# Patient Record
Sex: Male | Born: 1950
Health system: Southern US, Community
[De-identification: ages and names within clinical notes are randomized; demographics above are authoritative.]

## PROBLEM LIST (undated history)

## (undated) DIAGNOSIS — N186 End stage renal disease: Secondary | ICD-10-CM

## (undated) DIAGNOSIS — I509 Heart failure, unspecified: Secondary | ICD-10-CM

## (undated) DIAGNOSIS — H5440 Blindness, one eye, unspecified eye: Secondary | ICD-10-CM

## (undated) DIAGNOSIS — Z9289 Personal history of other medical treatment: Secondary | ICD-10-CM

## (undated) DIAGNOSIS — Z992 Dependence on renal dialysis: Secondary | ICD-10-CM

## (undated) DIAGNOSIS — M310 Hypersensitivity angiitis: Secondary | ICD-10-CM

## (undated) DIAGNOSIS — E119 Type 2 diabetes mellitus without complications: Secondary | ICD-10-CM

## (undated) DIAGNOSIS — N184 Chronic kidney disease, stage 4 (severe): Secondary | ICD-10-CM

## (undated) DIAGNOSIS — D649 Anemia, unspecified: Secondary | ICD-10-CM

## (undated) DIAGNOSIS — D61818 Other pancytopenia: Secondary | ICD-10-CM

## (undated) DIAGNOSIS — N189 Chronic kidney disease, unspecified: Secondary | ICD-10-CM

## (undated) DIAGNOSIS — I519 Heart disease, unspecified: Secondary | ICD-10-CM

## (undated) DIAGNOSIS — Z862 Personal history of diseases of the blood and blood-forming organs and certain disorders involving the immune mechanism: Secondary | ICD-10-CM

## (undated) DIAGNOSIS — K7460 Unspecified cirrhosis of liver: Secondary | ICD-10-CM

## (undated) DIAGNOSIS — I10 Essential (primary) hypertension: Secondary | ICD-10-CM

## (undated) HISTORY — PX: CATARACT EXTRACTION W/ INTRAOCULAR LENS  IMPLANT, BILATERAL: SHX1307

---
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
## 2004-02-20 ENCOUNTER — Emergency Department (HOSPITAL_COMMUNITY): Admission: EM | Admit: 2004-02-20 | Discharge: 2004-02-20 | Payer: Self-pay | Admitting: Family Medicine

## 2004-04-05 ENCOUNTER — Emergency Department (HOSPITAL_COMMUNITY): Admission: EM | Admit: 2004-04-05 | Discharge: 2004-04-05 | Payer: Self-pay | Admitting: Family Medicine

## 2005-08-05 HISTORY — PX: NECK SURGERY: SHX720

## 2009-11-10 ENCOUNTER — Emergency Department (HOSPITAL_COMMUNITY): Admission: EM | Admit: 2009-11-10 | Discharge: 2009-11-10 | Payer: Self-pay | Admitting: Emergency Medicine

## 2010-10-24 LAB — GLUCOSE, CAPILLARY: Glucose-Capillary: 276 mg/dL — ABNORMAL HIGH (ref 70–99)

## 2011-05-27 ENCOUNTER — Inpatient Hospital Stay (INDEPENDENT_AMBULATORY_CARE_PROVIDER_SITE_OTHER)
Admission: RE | Admit: 2011-05-27 | Discharge: 2011-05-27 | Disposition: A | Discharge status: Home / Self Care | Payer: Self-pay | Source: Ambulatory Visit | Attending: Emergency Medicine | Admitting: Emergency Medicine

## 2011-05-27 DIAGNOSIS — R51 Headache: Secondary | ICD-10-CM

## 2011-05-27 DIAGNOSIS — I10 Essential (primary) hypertension: Secondary | ICD-10-CM

## 2011-06-03 ENCOUNTER — Inpatient Hospital Stay (INDEPENDENT_AMBULATORY_CARE_PROVIDER_SITE_OTHER)
Admission: RE | Admit: 2011-06-03 | Discharge: 2011-06-03 | Disposition: A | Discharge status: Home / Self Care | Payer: Self-pay | Source: Ambulatory Visit | Attending: Family Medicine | Admitting: Family Medicine

## 2011-06-03 DIAGNOSIS — I10 Essential (primary) hypertension: Secondary | ICD-10-CM

## 2012-06-25 ENCOUNTER — Emergency Department (HOSPITAL_COMMUNITY)
Admission: EM | Admit: 2012-06-25 | Discharge: 2012-06-25 | Disposition: A | Discharge status: Home / Self Care | Payer: Self-pay | Source: Home / Self Care

## 2012-06-25 DIAGNOSIS — E119 Type 2 diabetes mellitus without complications: Secondary | ICD-10-CM

## 2012-06-25 DIAGNOSIS — I10 Essential (primary) hypertension: Secondary | ICD-10-CM

## 2012-06-25 LAB — BASIC METABOLIC PANEL
BUN: 38 mg/dL — ABNORMAL HIGH (ref 6–23)
CO2: 26 mEq/L (ref 19–32)
GFR calc Af Amer: >90 mL/min (ref >90)
GFR calc non Af Amer: 88 mL/min — ABNORMAL LOW (ref >90)
Glucose, Bld: 276 mg/dL — ABNORMAL HIGH (ref 70–99)
Potassium: 4.3 mEq/L (ref 3.5–5.1)

## 2012-06-25 MED ORDER — AMLODIPINE BESYLATE 10 MG PO TABS: 10.0000 mg | ORAL_TABLET | Freq: Every day | ORAL | Status: DC

## 2012-06-25 MED ORDER — METOPROLOL TARTRATE 50 MG PO TABS: 25.0000 mg | ORAL_TABLET | Freq: Two times a day (BID) | ORAL | Status: DC

## 2012-06-25 MED ORDER — ANTI-GAS PO CAPS: 30.0000 | ORAL_CAPSULE | Freq: Three times a day (TID) | ORAL | Status: DC | PRN

## 2012-06-25 NOTE — ED Provider Notes (Signed)
Patient Demographics  William [name], is a 61 y.o. male  WUJ:[id number]  [id number]  DOB - 03-Feb-1951  Chief Complaint  Patient presents with  . Hypertension  . Diabetes        Subjective:   William [name] today has, No headache, No chest pain, No abdominal pain - No Nausea, No new weakness tingling or numbness, No Cough - SOB.  Objective:    Filed Vitals:   06/25/12 1722  BP: 165/81  Pulse: 99  Temp: 98.7 F (37.1 C)  TempSrc: Oral  Resp: 18  SpO2: 100%     Exam  Awake Alert, Oriented X 3, No new F.N deficits, Normal affect [place].AT,PERRAL Supple Neck,No JVD, No cervical lymphadenopathy appriciated.  Symmetrical Chest wall movement, Good air movement bilaterally, CTAB RRR,No Gallops,Rubs or new Murmurs, No Parasternal Heave +ve B.Sounds, Abd Soft, Non tender, No organomegaly appriciated, No rebound - guarding or rigidity. No Cyanosis, Clubbing or edema, No new Rash or bruise      Data Review   CBC No results found for this basename: WBC:5,HGB:5,HCT:5,PLT:5,MCV:5,MCH:5,MCHC:5,RDW:5,NEUTRABS:5,LYMPHSABS:5,MONOABS:5,EOSABS:5,BASOSABS:5,BANDABS:5,BANDSABD:5 in the last 168 hours  Chemistries   No results found for this basename: NA:5,K:5,CL:5,CO2:5,GLUCOSE:5,BUN:5,CREATININE:5,GFRCGP,:5,CALCIUM:5,MG:5,AST:5,ALT:5,ALKPHOS:5,BILITOT:5 in the last 168 hours ------------------------------------------------------------------------------------------------------------------ No results found for this basename: HGBA1C:2 in the last 72 hours ------------------------------------------------------------------------------------------------------------------ No results found for this basename: CHOL:2,HDL:2,LDLCALC:2,TRIG:2,CHOLHDL:2,LDLDIRECT:2 in the last 72 hours ------------------------------------------------------------------------------------------------------------------ No results found for this basename: TSH,T4TOTAL,FREET3,T3FREE,THYROIDAB in the last 72  hours ------------------------------------------------------------------------------------------------------------------ No results found for this basename: VITAMINB12:2,FOLATE:2,FERRITIN:2,TIBC:2,IRON:2,RETICCTPCT:2 in the last 72 hours  Coagulation profile  No results found for this basename: INR:5,PROTIME:5 in the last 168 hours  Urinalysis No results found for this basename: colorurine, appearanceur, labspec, phurine, glucoseu, hgbur, bilirubinur, ketonesur, proteinur, urobilinogen, nitrite, leukocytesur     Prior to Admission medications   Medication Sig Start Date End Date Taking? Authorizing Provider  insulin NPH-insulin regular (NOVOLIN 70/30) (70-30) 100 UNIT/ML injection Inject into the skin 2 (two) times daily with a meal. 30 units in am and 10 units at night   Yes Historical Provider, MD  amLODipine (NORVASC) 10 MG tablet Take 1 tablet (10 mg total) by mouth daily. 06/25/12   Thurnell Lose, MD  metoprolol (LOPRESSOR) 50 MG tablet Take 0.5 tablets (25 mg total) by mouth 2 (two) times daily. 06/25/12   Thurnell Lose, MD     Assessment & Plan   He should here for routine followup visit for medication refills his only complaint is he has been noticing his blood pressure has been running high at home.   1. Essential hypertension in poor control patient not on any blood pressure medications, I have placed him on Norvasc and Lopressor, also check baseline BMP, we'll get him back in a week to monitor on his BMP along with his blood pressure, if BMP is stable and blood pressure still elevated low-dose ACE inhibitor can be added.   2. Diabetes mellitus type 2 home sugars running between 125 and 170, counseled on q. a.c. at bedtime Accu-Cheks, we'll continue home dose 7030, will check A1c, patient to come back in a week for A1c and Accu-Chek followup.      Thurnell Lose M.D on 06/25/2012 at 5:25 PM  Thurnell Lose, MD 06/25/12 1728

## 2012-06-25 NOTE — ED Notes (Signed)
Per son pt is having problems with htn and needs blood pressure checked

## 2012-06-26 LAB — HEMOGLOBIN A1C: Mean Plasma Glucose: 226 mg/dL — ABNORMAL HIGH (ref <117)

## 2013-01-15 NOTE — Progress Notes (Signed)
Quick Note:  Please hava patient come back for diabetes management ______

## 2013-01-21 ENCOUNTER — Telehealth: Payer: Self-pay | Admitting: *Deleted

## 2013-01-21 NOTE — Telephone Encounter (Signed)
01/21/13 Patient not available spoke with son who said his father is in [place] And will be returning today from [place] will call and schedule appt. For follow up. P.Jasa Dundon,RN BSN [facility]

## 2013-01-27 ENCOUNTER — Emergency Department (HOSPITAL_BASED_OUTPATIENT_CLINIC_OR_DEPARTMENT_OTHER)
Admission: EM | Admit: 2013-01-27 | Discharge: 2013-01-27 | Disposition: A | Discharge status: Home / Self Care | Payer: Self-pay | Attending: Emergency Medicine | Admitting: Emergency Medicine

## 2013-01-27 ENCOUNTER — Emergency Department (HOSPITAL_BASED_OUTPATIENT_CLINIC_OR_DEPARTMENT_OTHER): Payer: Self-pay

## 2013-01-27 ENCOUNTER — Encounter (HOSPITAL_BASED_OUTPATIENT_CLINIC_OR_DEPARTMENT_OTHER): Payer: Self-pay | Admitting: Family Medicine

## 2013-01-27 DIAGNOSIS — J189 Pneumonia, unspecified organism: Secondary | ICD-10-CM

## 2013-01-27 DIAGNOSIS — E119 Type 2 diabetes mellitus without complications: Secondary | ICD-10-CM | POA: Insufficient documentation

## 2013-01-27 DIAGNOSIS — R0602 Shortness of breath: Secondary | ICD-10-CM | POA: Insufficient documentation

## 2013-01-27 DIAGNOSIS — R5381 Other malaise: Secondary | ICD-10-CM | POA: Insufficient documentation

## 2013-01-27 DIAGNOSIS — Z794 Long term (current) use of insulin: Secondary | ICD-10-CM | POA: Insufficient documentation

## 2013-01-27 DIAGNOSIS — R509 Fever, unspecified: Secondary | ICD-10-CM | POA: Insufficient documentation

## 2013-01-27 DIAGNOSIS — R1084 Generalized abdominal pain: Secondary | ICD-10-CM | POA: Insufficient documentation

## 2013-01-27 DIAGNOSIS — J159 Unspecified bacterial pneumonia: Secondary | ICD-10-CM | POA: Insufficient documentation

## 2013-01-27 DIAGNOSIS — IMO0001 Reserved for inherently not codable concepts without codable children: Secondary | ICD-10-CM | POA: Insufficient documentation

## 2013-01-27 DIAGNOSIS — I10 Essential (primary) hypertension: Secondary | ICD-10-CM | POA: Insufficient documentation

## 2013-01-27 DIAGNOSIS — Z79899 Other long term (current) drug therapy: Secondary | ICD-10-CM | POA: Insufficient documentation

## 2013-01-27 DIAGNOSIS — R0989 Other specified symptoms and signs involving the circulatory and respiratory systems: Secondary | ICD-10-CM | POA: Insufficient documentation

## 2013-01-27 DIAGNOSIS — R112 Nausea with vomiting, unspecified: Secondary | ICD-10-CM | POA: Insufficient documentation

## 2013-01-27 HISTORY — DX: Essential (primary) hypertension: I10

## 2013-01-27 LAB — COMPREHENSIVE METABOLIC PANEL
AST: 44 U/L — ABNORMAL HIGH (ref 0–37)
Chloride: 95 mEq/L — ABNORMAL LOW (ref 96–112)
Creatinine, Ser: 1.2 mg/dL (ref 0.50–1.35)
GFR calc non Af Amer: 64 mL/min — ABNORMAL LOW (ref >90)
Glucose, Bld: 324 mg/dL — ABNORMAL HIGH (ref 70–99)
Potassium: 4.5 mEq/L (ref 3.5–5.1)
Sodium: 131 mEq/L — ABNORMAL LOW (ref 135–145)
Total Protein: 7.6 g/dL (ref 6.0–8.3)

## 2013-01-27 LAB — URINALYSIS, ROUTINE W REFLEX MICROSCOPIC

## 2013-01-27 LAB — URINE MICROSCOPIC-ADD ON

## 2013-01-27 LAB — CG4 I-STAT (LACTIC ACID): Lactic Acid, Venous: 0.97 mmol/L (ref 0.5–2.2)

## 2013-01-27 MED ORDER — IBUPROFEN 600 MG PO TABS: 600.0000 mg | ORAL_TABLET | Freq: Four times a day (QID) | ORAL | Status: DC | PRN

## 2013-01-27 MED ORDER — AZITHROMYCIN 250 MG PO TABS: ORAL_TABLET | ORAL | Status: DC

## 2013-01-27 MED ORDER — ONDANSETRON HCL 4 MG/2ML IJ SOLN
4.0000 mg | Freq: Once | INTRAMUSCULAR | Status: AC
  Administered 2013-01-27: 4 mg via INTRAVENOUS
  Filled 2013-01-27: qty 2

## 2013-01-27 MED ORDER — DEXTROSE 5 % IV SOLN
1.0000 g | Freq: Once | INTRAVENOUS | Status: AC
  Administered 2013-01-27: 21:00:00 via INTRAVENOUS
  Filled 2013-01-27: qty 10

## 2013-01-27 MED ORDER — ACETAMINOPHEN 325 MG PO TABS
650.0000 mg | ORAL_TABLET | Freq: Once | ORAL | Status: AC
  Administered 2013-01-27: 650 mg via ORAL

## 2013-01-27 MED ORDER — BENZONATATE 100 MG PO CAPS: 100.0000 mg | ORAL_CAPSULE | Freq: Three times a day (TID) | ORAL | Status: DC

## 2013-01-27 MED ORDER — ACETAMINOPHEN 325 MG PO TABS
ORAL_TABLET | ORAL | Status: AC
  Administered 2013-01-27: 650 mg via ORAL
  Filled 2013-01-27: qty 2

## 2013-01-27 MED ORDER — SODIUM CHLORIDE 0.9 % IV BOLUS (SEPSIS)
1000.0000 mL | Freq: Once | INTRAVENOUS | Status: AC
  Administered 2013-01-27: 1000 mL via INTRAVENOUS

## 2013-01-27 NOTE — ED Provider Notes (Signed)
History    CSN: [id number] Arrival date & time 01/27/13  1840  First MD Initiated Contact with Patient 01/27/13 1901     Chief Complaint  Patient presents with  . Fever  . Cough   (Consider location/radiation/quality/duration/timing/severity/associated sxs/prior Treatment) HPI Pt with 3-4 days of cough, fever, nausea and diffuse abd pain. Pt does not speak english and family at bedside interpreting. No lower ext swelling. No rash. No sick contacts. Recent flight to [place].  Past Medical History  Diagnosis Date  . Diabetes mellitus without complication   . Hypertension    Past Surgical History  Procedure Laterality Date  . Neck surgery     No family history on file. History  Substance Use Topics  . Smoking status: Never Smoker   . Smokeless tobacco: Not on file  . Alcohol Use: No    Review of Systems  Constitutional: Positive for fever, chills and fatigue.  HENT: Negative for sore throat, neck pain, neck stiffness and sinus pressure.   Respiratory: Positive for cough and shortness of breath.   Cardiovascular: Negative for chest pain, palpitations and leg swelling.  Gastrointestinal: Positive for nausea, vomiting and abdominal pain. Negative for diarrhea.  Genitourinary: Negative for dysuria.  Musculoskeletal: Positive for myalgias.  Skin: Negative for rash and wound.  Neurological: Negative for dizziness, weakness, light-headedness, numbness and headaches.  All other systems reviewed and are negative.    Allergies  Review of patient's allergies indicates no known allergies.  Home Medications   Current Outpatient Rx  Name  Route  Sig  Dispense  Refill  . Alpha-D-Galactosidase (ANTI-GAS) CAPS   Oral   Take 30 capsules by mouth 3 (three) times daily as needed (gas).   30 capsule   0   . amLODipine (NORVASC) 10 MG tablet   Oral   Take 1 tablet (10 mg total) by mouth daily.   30 tablet   1   . azithromycin (ZITHROMAX Z-PAK) 250 MG tablet      2 po day  one, then 1 daily x 4 days   5 tablet   0   . benzonatate (TESSALON) 100 MG capsule   Oral   Take 1 capsule (100 mg total) by mouth every 8 (eight) hours.   21 capsule   0   . ibuprofen (ADVIL,MOTRIN) 600 MG tablet   Oral   Take 1 tablet (600 mg total) by mouth every 6 (six) hours as needed for pain.   30 tablet   0   . insulin NPH-insulin regular (NOVOLIN 70/30) (70-30) 100 UNIT/ML injection   Subcutaneous   Inject into the skin 2 (two) times daily with a meal. 30 units in am and 10 units at night         . metoprolol (LOPRESSOR) 50 MG tablet   Oral   Take 0.5 tablets (25 mg total) by mouth 2 (two) times daily.   60 tablet   1    BP 124/60  Pulse 85  Temp(Src) 99.2 F (37.3 C) (Oral)  Resp 18  Wt 143 lb 3 oz (64.949 kg)  SpO2 94% Physical Exam  Nursing note and vitals reviewed. Constitutional: He is oriented to person, place, and time. He appears well-developed and well-nourished. No distress.  HENT:  Head: Normocephalic and atraumatic.  Mouth/Throat: Oropharynx is clear and moist.  Eyes: EOM are normal. Pupils are equal, round, and reactive to light.  Neck: Normal range of motion. Neck supple.  No meningismus   Cardiovascular: Normal rate  and regular rhythm.   Pulmonary/Chest: Effort normal. No respiratory distress. He has no wheezes. He has rales (Rales greater on L than R).  Abdominal: Soft. Bowel sounds are normal. He exhibits no distension and no mass. There is no tenderness. There is no rebound and no guarding.  Musculoskeletal: Normal range of motion. He exhibits no edema and no tenderness.  No calf tenderness or swelling  Neurological: He is alert and oriented to person, place, and time.  5/5 motor in all ext, sensation intact  Skin: Skin is warm and dry. No rash noted. No erythema.  Psychiatric: He has a normal mood and affect. His behavior is normal.    ED Course  Procedures (including critical care time) Labs Reviewed  CBC WITH DIFFERENTIAL -  Abnormal; Notable for the following:    WBC 3.8 (*)    Hemoglobin 12.1 (*)    HCT 34.8 (*)    MCV 69.9 (*)    MCH 24.3 (*)    Platelets 51 (*)    All other components within normal limits  COMPREHENSIVE METABOLIC PANEL - Abnormal; Notable for the following:    Sodium 131 (*)    Chloride 95 (*)    Glucose, Bld 324 (*)    BUN 39 (*)    Albumin 2.9 (*)    AST 44 (*)    GFR calc non Af Amer 64 (*)    GFR calc Af Amer 74 (*)    All other components within normal limits  URINALYSIS, ROUTINE W REFLEX MICROSCOPIC - Abnormal; Notable for the following:    Glucose, UA >1000 (*)    Hgb urine dipstick MODERATE (*)    Protein, ur >300 (*)    All other components within normal limits  CULTURE, BLOOD (ROUTINE X 2)  CULTURE, BLOOD (ROUTINE X 2)  URINE MICROSCOPIC-ADD ON  CG4 I-STAT (LACTIC ACID)   Dg Chest 2 View  01/27/2013   *RADIOLOGY REPORT*  Clinical Data: 62 year old male with recent overseas travel.  Cough fever flu-like symptoms.  Hypertension diabetes.  CHEST - 2 VIEW  Comparison: 11/10/2009.  Findings: Larger lung volumes.  No pleural effusion or consolidation.  There is patchy perihilar and left basilar opacity. No pneumothorax or edema.  Cardiac size and mediastinal contours are within normal limits.  Visualized tracheal air column is within normal limits.  No acute osseous abnormality identified.  IMPRESSION: Patchy perihilar and left basilar opacity compatible with viral / atypical respiratory infection.   Original Report Authenticated By: Roselyn Reef, M.D.   1. Community acquired pneumonia     MDM  Pt feeling much better. Have advised finding PMD to manage BP. Return precautions given.   Julianne Rice, MD 01/27/13 2251

## 2013-01-27 NOTE — ED Notes (Signed)
Pt returned from [place] recently (june 19) and c/o cough, fever, flu like symptoms, nausea.

## 2013-01-29 LAB — CBC WITH DIFFERENTIAL/PLATELET
Basophils Absolute: 0 10*3/uL (ref 0.0–0.1)
Eosinophils Relative: 1 % (ref 0–5)
Hemoglobin: 12.1 g/dL — ABNORMAL LOW (ref 13.0–17.0)
Lymphocytes Relative: 24 % (ref 12–46)
Monocytes Relative: 9 % (ref 3–12)
Platelets: 51 10*3/uL — ABNORMAL LOW (ref 150–400)
RBC: 4.98 MIL/uL (ref 4.22–5.81)

## 2013-02-03 LAB — CULTURE, BLOOD (ROUTINE X 2)
Culture: NO GROWTH
Culture: NO GROWTH
Special Requests: NORMAL
Special Requests: NORMAL

## 2013-06-10 ENCOUNTER — Emergency Department (INDEPENDENT_AMBULATORY_CARE_PROVIDER_SITE_OTHER): Payer: Self-pay

## 2013-06-10 ENCOUNTER — Emergency Department (INDEPENDENT_AMBULATORY_CARE_PROVIDER_SITE_OTHER)
Admission: EM | Admit: 2013-06-10 | Discharge: 2013-06-10 | Disposition: A | Discharge status: Home / Self Care | Payer: Self-pay | Source: Home / Self Care | Attending: Family Medicine | Admitting: Family Medicine

## 2013-06-10 ENCOUNTER — Encounter (HOSPITAL_COMMUNITY): Payer: Self-pay | Admitting: Emergency Medicine

## 2013-06-10 DIAGNOSIS — G8929 Other chronic pain: Secondary | ICD-10-CM

## 2013-06-10 DIAGNOSIS — M542 Cervicalgia: Secondary | ICD-10-CM | POA: Diagnosis present

## 2013-06-10 DIAGNOSIS — R109 Unspecified abdominal pain: Secondary | ICD-10-CM

## 2013-06-10 DIAGNOSIS — R05 Cough: Secondary | ICD-10-CM

## 2013-06-10 MED ORDER — METOPROLOL TARTRATE 50 MG PO TABS: 25.0000 mg | ORAL_TABLET | Freq: Two times a day (BID) | ORAL | Status: DC

## 2013-06-10 MED ORDER — GUAIFENESIN-CODEINE 100-10 MG/5ML PO SOLN: 5.0000 mL | Freq: Four times a day (QID) | ORAL | Status: DC | PRN

## 2013-06-10 MED ORDER — TRAMADOL HCL 50 MG PO TABS: 50.0000 mg | ORAL_TABLET | Freq: Four times a day (QID) | ORAL | Status: DC | PRN

## 2013-06-10 MED ORDER — RANITIDINE HCL 150 MG PO CAPS: 150.0000 mg | ORAL_CAPSULE | Freq: Two times a day (BID) | ORAL | Status: DC

## 2013-06-10 MED ORDER — AMLODIPINE BESYLATE 10 MG PO TABS: 10.0000 mg | ORAL_TABLET | Freq: Every day | ORAL | Status: DC

## 2013-06-10 NOTE — ED Notes (Signed)
C/o cough which has been going on for 10 years  abd pain which has been going on for two years Gas tabs has been taking but no relief.  Patient has pain in neck since surgery in [place] in 2008

## 2013-06-10 NOTE — ED Provider Notes (Signed)
[place] [name] is a 62 y.o. male who presents to Urgent Care today for neck pain, chronic cough, and abdominal pain. 1) patient is here complaining of a cough. He has had a chronic cough off-and-on for the last 15 years. He is from [name] and has been evaluated here and in [name] for this complaint. He notes that doctors have given a medicine in the past which only has worked temporarily. He denies any history of tuberculosis.  2) neck pain. Patient has a history of chronic neck pain for the past several years. He has a remote history of what seems to be a cervical spinal fusion surgery. This occurred in [name] several years ago. He has had pain ever since. His pain is not particularly worse currently. 3) diarrhea: Patient has mild abdominal pain and frequent bowel movements. This didn't occur after eating. He is does not take any medications for this. This is been present for 2 years and has not changed. 4) hypertension: Patient has a history of hypertension but has been out of his medicines for sometime now. [place] [name] chest pains or palpitations or trouble breathing.   Past Medical History  Diagnosis Date  . Diabetes mellitus without complication   . Hypertension    History  Substance Use Topics  . Smoking status: Never Smoker   . Smokeless tobacco: Not on file  . Alcohol Use: [name]   ROS as above Medications reviewed. [place] [name] current facility-administered medications for this encounter.   Current Outpatient Prescriptions  Medication Sig Dispense Refill  . Alpha-D-Galactosidase (ANTI-GAS) CAPS Take 30 capsules by mouth 3 (three) times daily as needed (gas).  30 capsule  0  . amLODipine (NORVASC) 10 MG tablet Take 1 tablet (10 mg total) by mouth daily.  30 tablet  1  . guaiFENesin-codeine 100-10 MG/5ML syrup Take 5 mLs by mouth every 6 (six) hours as needed for cough.  120 mL  0  . insulin NPH-insulin regular (NOVOLIN 70/30) (70-30) 100 UNIT/ML injection Inject into the skin 2 (two) times daily with a meal.  30 units in am and 10 units at night      . metoprolol (LOPRESSOR) 50 MG tablet Take 0.5 tablets (25 mg total) by mouth 2 (two) times daily.  60 tablet  1  . ranitidine (ZANTAC) 150 MG capsule Take 1 capsule (150 mg total) by mouth 2 (two) times daily.  60 capsule  2  . traMADol (ULTRAM) 50 MG tablet Take 1 tablet (50 mg total) by mouth every 6 (six) hours as needed.  30 tablet  0    Exam:  BP 210/98  Pulse 110  Temp(Src) 98.4 F (36.9 C) (Oral)  Resp 20  SpO2 97% Gen: Well NAD HEENT: EOMI,  MMM Lungs: CTABL Nl WOB Heart: RRR [name] MRG Abd: NABS, NT, ND soft [name] rebound or guarding Exts: Non edematous BL  LE, warm and well perfused.  Neck: Nontender spinal midline decreased neck range of motion  [place] [name] results found for this or any previous visit (from the past 24 hour(s)). Dg Chest 2 View  06/10/2013   CLINICAL DATA:  Chronic cough and history of diabetes mellitus.  EXAM: CHEST  2 VIEW  COMPARISON:  January 27, 2013.  FINDINGS: The lungs are adequately inflated. There is [place] [name] focal infiltrate. The cardiac silhouette is normal in size. The pulmonary vascularity is not engorged. The mediastinum is normal in width. There is [place] [name] pleural effusion or pneumothorax or pneumomediastinum. The observed portions of the bony thorax are normal.  IMPRESSION: There is [place] [name] evidence of pneumonia nor other acute cardiopulmonary abnormality.   Electronically Signed   By: David  Martinique   On: 06/10/2013 15:36   Dg Cervical Spine Complete  06/10/2013   CLINICAL DATA:  Posterior neck pain.  EXAM: CERVICAL SPINE  4+ VIEWS  COMPARISON:  None.  FINDINGS: There is [place] [name] evidence of cervical spine fracture nor prevertebral soft tissue swelling. The patient has a reported history of cervical spine fusion at the C5-6 level. Mild kyphotic curvature is appreciated at this level postprocedural. The intervertebral disc space heights otherwise maintained.  IMPRESSION: Postsurgical changes without evidence of acute abnormalities.    Electronically Signed   By: Margaree Mackintosh M.D.   On: 06/10/2013 15:38    Assessment and Plan: 62 y.o. male with  1) chronic cough: Unclear etiology. Most likely reflux. Plan to treat with Zantac as this is likely available in [name] as well. We'll followup with 2 [name]. Codeine containing cough medication as needed 2) abdominal pain: Likely reflux related. We'll use Zantac as well. 3) chronic neck pain:  DDD related changes secondary to spinal fusion. Plan for Tylenol and tramadol for pain control as needed 4) hypertension: Plan to restart amlodipine and metoprolol.  Followup with the [place], MD 06/10/13 [phone]

## 2015-08-25 ENCOUNTER — Ambulatory Visit: Payer: Self-pay | Attending: Internal Medicine | Admitting: Internal Medicine

## 2015-08-25 ENCOUNTER — Encounter: Payer: Self-pay | Admitting: Internal Medicine

## 2015-08-25 VITALS — BP 179/80 | HR 92 | Temp 98.0°F | Resp 17 | Ht 65.0 in | Wt 147.4 lb

## 2015-08-25 DIAGNOSIS — Z794 Long term (current) use of insulin: Secondary | ICD-10-CM | POA: Insufficient documentation

## 2015-08-25 DIAGNOSIS — E1129 Type 2 diabetes mellitus with other diabetic kidney complication: Secondary | ICD-10-CM | POA: Insufficient documentation

## 2015-08-25 DIAGNOSIS — E119 Type 2 diabetes mellitus without complications: Secondary | ICD-10-CM

## 2015-08-25 DIAGNOSIS — E1165 Type 2 diabetes mellitus with hyperglycemia: Secondary | ICD-10-CM | POA: Insufficient documentation

## 2015-08-25 DIAGNOSIS — I10 Essential (primary) hypertension: Secondary | ICD-10-CM | POA: Insufficient documentation

## 2015-08-25 LAB — POCT GLYCOSYLATED HEMOGLOBIN (HGB A1C): HEMOGLOBIN A1C: 9.2

## 2015-08-25 LAB — GLUCOSE, POCT (MANUAL RESULT ENTRY): POC GLUCOSE: 73 mg/dL (ref 70–99)

## 2015-08-25 MED ORDER — AMLODIPINE BESYLATE 10 MG PO TABS: 10.0000 mg | ORAL_TABLET | Freq: Every day | ORAL | Status: DC

## 2015-08-25 MED FILL — AMLODIPINE BESYLATE 10 MG T: 10 | 30 days supply | Qty: 30 | Fill #0

## 2015-08-25 NOTE — Progress Notes (Signed)
Patient ID: William [name], male   DOB: 12-Apr-1951, 65 y.o.   MRN: [id number]  GDJ:[id number]  [id number]:[id number]  DOB - 02/20/1951  CC:  Chief Complaint  Patient presents with  . Follow-up       HPI: William [name] is a 65 y.o. male here today to establish medical care. Patient has a past medical history of Type 2 Diabetes and hypertension. Patient is present with his son who is giving his history. Son states that 20 years ago patient was told by his eye doctor that he has lost vision in his right eye and had a left eye cataract removal as well. Last eye exam was one year ago. Patient reports that he only uses Novolin 70/30 for his diabetes and has not been on any blood pressure medication in several months. He uses 30 units in AM and 15 units in PM. Blood sugars range from 90-300's.  Patient has No headache, No chest pain, No abdominal pain - No Nausea, No new weakness tingling or numbness, No Cough - SOB.  No Known Allergies Past Medical History  Diagnosis Date  . Diabetes mellitus without complication ([place])   . Hypertension    Current Outpatient Prescriptions on File Prior to Visit  Medication Sig Dispense Refill  . insulin NPH-insulin regular (NOVOLIN 70/30) (70-30) 100 UNIT/ML injection Inject into the skin 2 (two) times daily with a meal. 30 units in am and 10 units at night    . Alpha-D-Galactosidase (ANTI-GAS) CAPS Take 30 capsules by mouth 3 (three) times daily as needed (gas). 30 capsule 0  . amLODipine (NORVASC) 10 MG tablet Take 1 tablet (10 mg total) by mouth daily. (Patient not taking: Reported on 08/25/2015) 30 tablet 1  . guaiFENesin-codeine 100-10 MG/5ML syrup Take 5 mLs by mouth every 6 (six) hours as needed for cough. 120 mL 0  . metoprolol (LOPRESSOR) 50 MG tablet Take 0.5 tablets (25 mg total) by mouth 2 (two) times daily. (Patient not taking: Reported on 08/25/2015) 60 tablet 1  . ranitidine (ZANTAC) 150 MG capsule Take 1 capsule (150 mg total) by mouth 2 (two) times daily. 60  capsule 2  . traMADol (ULTRAM) 50 MG tablet Take 1 tablet (50 mg total) by mouth every 6 (six) hours as needed. 30 tablet 0   No current facility-administered medications on file prior to visit.   History reviewed. No pertinent family history. Social History   Social History  . Marital Status: Single    Spouse Name: N/A  . Number of Children: N/A  . Years of Education: N/A   Occupational History  . Not on file.   Social History Main Topics  . Smoking status: Never Smoker   . Smokeless tobacco: Not on file  . Alcohol Use: No  . Drug Use: Not on file  . Sexual Activity: Not on file   Other Topics Concern  . Not on file   Social History Narrative    Review of Systems: Constitutional: Negative for fever, chills, diaphoresis, activity change, appetite change and fatigue. HENT: Negative for ear pain, nosebleeds, congestion, facial swelling, rhinorrhea, neck pain, neck stiffness and ear discharge.  Eyes: Negative for pain, discharge, redness, itching and visual disturbance. Respiratory: Negative for cough, choking, chest tightness, shortness of breath, wheezing and stridor.  Cardiovascular: Negative for chest pain, palpitations and leg swelling. Gastrointestinal: Negative for abdominal distention. Genitourinary: Negative for dysuria, urgency, frequency, hematuria, flank pain, decreased urine volume, difficulty urinating and dyspareunia.  Musculoskeletal: Negative for back pain, joint swelling,  arthralgia and gait problem. Neurological: Negative for dizziness, tremors, seizures, syncope, facial asymmetry, speech difficulty, weakness, light-headedness, numbness and headaches.  Hematological: Negative for adenopathy. Does not bruise/bleed easily. Psychiatric/Behavioral: Negative for hallucinations, behavioral problems, confusion, dysphoric mood, decreased concentration and agitation.    Objective:   Filed Vitals:   08/25/15 1610 08/25/15 1613  BP: 182/91 179/80  Pulse: 102 92    Temp: 98 F (36.7 C)   Resp: 17     Physical Exam: Constitutional: Patient appears well-developed and well-nourished. No distress. HENT: Normocephalic, atraumatic, External right and left ear normal. Oropharynx is clear and moist.  Eyes: Conjunctivae and EOM are normal. PERRLA, no scleral icterus. Neck: Normal ROM. Neck supple. No JVD. No tracheal deviation. No thyromegaly. CVS: RRR, S1/S2 +, no murmurs, no gallops, no carotid bruit.  Pulmonary: Effort and breath sounds normal, no stridor, rhonchi, wheezes, rales.  Abdominal: Soft. BS +, no distension, tenderness, rebound or guarding.  Musculoskeletal: Normal range of motion. No edema and no tenderness.  Lymphadenopathy: No lymphadenopathy noted, cervical, inguinal or axillary Neuro: Alert. Normal reflexes, muscle tone coordination. No cranial nerve deficit. Skin: Skin is warm and dry. No rash noted. Not diaphoretic. No erythema. No pallor. Psychiatric: Normal mood and affect. Behavior, judgment, thought content normal.  Lab Results  Component Value Date   WBC 3.8* 01/27/2013   HGB 12.1* 01/27/2013   HCT 34.8* 01/27/2013   MCV 69.9* 01/27/2013   PLT 51* 01/27/2013   Lab Results  Component Value Date   CREATININE 1.20 01/27/2013   BUN 39* 01/27/2013   NA 131* 01/27/2013   K 4.5 01/27/2013   CL 95* 01/27/2013   CO2 25 01/27/2013    Lab Results  Component Value Date   HGBA1C 9.20 08/25/2015   Lipid Panel  No results found for: CHOL, TRIG, HDL, CHOLHDL, VLDL, LDLCALC     Assessment and plan:   [place] [name] was seen today for follow-up.  Diagnoses and all orders for this visit:  Type 2 diabetes mellitus without complication, with long-term current use of insulin (HCC) -     Glucose (CBG) -     HgB A1c -     Lipid panel; Future -     Microalbumin, urine Patients diabetes remains uncontrolled as evidence by hemoglobin a1c >8.  Patient has reports compliance with medication regimen but I believe he needs more guidance.  Stressed the multiple complications associated with uncontrolled diabetes.  Patient will stay on current medication dose and report back to clinic with cbg log in 2 weeks so that I can make appropriate changes in regimen.  Essential hypertension -     Begin amLODipine (NORVASC) 10 MG tablet; Take 1 tablet (10 mg total) by mouth daily. -     COMPLETE METABOLIC PANEL WITH GFR; Future -     CBC with Differential; Future I will restart patient back on last medication and have him come back in 1 week for a BP recheck. If patients BP is still not controlled on Amlodipine then he will be a great candidate to add Lisinopril for renal protection.   Return in about 1 week (around 09/01/2015) for Nurse Visit-BP check/log review and Lab visit, 3 mo PCP .    Lance Bosch, [facility] [phone] 08/25/2015, 4:28 PM

## 2015-08-25 NOTE — Progress Notes (Signed)
Patient here for follow up on his diabetes and HTN Patient has only been taking his insulin -no other medications Patient also has a form for disability which he was instructed it will not Be filled out today. We will call him when the form is ready

## 2015-08-25 NOTE — Patient Instructions (Signed)
Please sign release of records for eye doctor so that I can complete your forms

## 2015-08-26 LAB — MICROALBUMIN, URINE: Microalb, Ur: 91.4 mg/dL

## 2015-08-30 ENCOUNTER — Encounter: Payer: Self-pay | Admitting: Pharmacist

## 2015-08-31 ENCOUNTER — Ambulatory Visit: Payer: Self-pay | Attending: Internal Medicine

## 2015-08-31 ENCOUNTER — Ambulatory Visit: Payer: Self-pay | Admitting: Pharmacist

## 2015-08-31 VITALS — BP 162/78 | HR 84

## 2015-08-31 DIAGNOSIS — Z794 Long term (current) use of insulin: Secondary | ICD-10-CM

## 2015-08-31 DIAGNOSIS — I10 Essential (primary) hypertension: Secondary | ICD-10-CM

## 2015-08-31 DIAGNOSIS — E119 Type 2 diabetes mellitus without complications: Secondary | ICD-10-CM

## 2015-08-31 DIAGNOSIS — Z79899 Other long term (current) drug therapy: Secondary | ICD-10-CM | POA: Insufficient documentation

## 2015-08-31 LAB — LIPID PANEL
CHOL/HDL RATIO: 4.2 ratio (ref <=5.0)
CHOLESTEROL: 164 mg/dL (ref 125–200)
HDL: 39 mg/dL — AB (ref >=40)
LDL Cholesterol: 111 mg/dL (ref <130)
TRIGLYCERIDES: 70 mg/dL (ref <150)
VLDL: 14 mg/dL (ref <30)

## 2015-08-31 LAB — COMPLETE METABOLIC PANEL WITH GFR
ALBUMIN: 3.4 g/dL — AB (ref 3.6–5.1)
ALT: 17 U/L (ref 9–46)
AST: 27 U/L (ref 10–35)
Alkaline Phosphatase: 96 U/L (ref 40–115)
BUN: 36 mg/dL — AB (ref 7–25)
CALCIUM: 8.8 mg/dL (ref 8.6–10.3)
CO2: 26 mmol/L (ref 20–31)
Chloride: 104 mmol/L (ref 98–110)
Creat: 1.23 mg/dL (ref 0.70–1.25)
GFR, EST AFRICAN AMERICAN: 71 mL/min (ref >=60)
GFR, Est Non African American: 62 mL/min (ref >=60)
Glucose, Bld: 76 mg/dL (ref 65–99)
POTASSIUM: 4.5 mmol/L (ref 3.5–5.3)
Sodium: 139 mmol/L (ref 135–146)
Total Bilirubin: 0.5 mg/dL (ref 0.2–1.2)
Total Protein: 6.8 g/dL (ref 6.1–8.1)

## 2015-08-31 MED ORDER — LISINOPRIL 10 MG PO TABS: 10.0000 mg | ORAL_TABLET | Freq: Every day | ORAL | Status: DC

## 2015-08-31 MED FILL — ?LISINOPRIL 10 MG TABLET: 10 | 30 days supply | Qty: 30 | Fill #0

## 2015-08-31 NOTE — Progress Notes (Signed)
S:    Patient arrives in good spirits with his son.    Presents to the clinic for hypertension evaluation.   Patient reports adherence with medications.  Current BP Medications include:  Amlodipine 10 mg daily  Antihypertensives tried in the past include: metoprolol tartrate 12.5 mg BID  Patient is asking if his paperwork from last week has been filled out by Chari Manning, NP.   Patient brought with him his blood glucose logs but it is only a weeks worth of information. His son reports that he eats a lot of grapes but that he has been cutting back on the amount of grapes that he eats and this is why his blood sugar improved during the week.    O:   Last 3 Office BP readings: BP Readings from Last 3 Encounters:  08/31/15 162/78  08/25/15 179/80  06/10/13 210/98    BMET    Component Value Date/Time   NA 131* 01/27/2013 1944   K 4.5 01/27/2013 1944   CL 95* 01/27/2013 1944   CO2 25 01/27/2013 1944   GLUCOSE 324* 01/27/2013 1944   BUN 39* 01/27/2013 1944   CREATININE 1.20 01/27/2013 1944   CALCIUM 9.2 01/27/2013 1944   GFRNONAA 64* 01/27/2013 1944   GFRAA 74* 01/27/2013 1944   Home CBGs fasting: 90s-200s Home CBGs post-prandial: 100s-300s  A/P: History of hypertension currently UNcontrolled on amlodipine 10 mg daily but has improved. Will initiate lisinopril 10 mg daily for renal protection. Will obtain labs at next week to assess potassium and renal function. Labs were also collected this morning. Counseled patient on the medication, including adverse effect and how to take.  Diabetes currently uncontrolled based on home CBGs - but they have improved over the week as patient has stopped eating as many grapes. Not enough readings to adjust insulin so instructed patient to continue to monitor and bring his log book back in 2 weeks for insulin adjustment.   Informed patient that we need the records from his eye doctor in order to complete the paperwork. Patient's son will get  the records and drop them off here.  Results reviewed and written information provided.   Total time in face-to-face counseling 20 minutes.  F/U Clinic Visit with me in 2 weeks.

## 2015-08-31 NOTE — Patient Instructions (Signed)
Thank you for coming to see me  Continue amlodipine 10 mg daily and start lisinopril 10 mg daily for blood pressure  Come back and see me in 2 weeks for blood pressure and blood sugar

## 2015-09-01 LAB — CBC WITH DIFFERENTIAL/PLATELET
BASOS ABS: 0 10*3/uL (ref 0.0–0.1)
BASOS PCT: 0 % (ref 0–1)
Eosinophils Absolute: 0.3 10*3/uL (ref 0.0–0.7)
Eosinophils Relative: 7 % — ABNORMAL HIGH (ref 0–5)
HCT: 35.9 % — ABNORMAL LOW (ref 39.0–52.0)
HEMOGLOBIN: 11.5 g/dL — AB (ref 13.0–17.0)
Lymphocytes Relative: 33 % (ref 12–46)
Lymphs Abs: 1.6 10*3/uL (ref 0.7–4.0)
MCH: 23.9 pg — ABNORMAL LOW (ref 26.0–34.0)
MCHC: 32 g/dL (ref 30.0–36.0)
MCV: 74.5 fL — ABNORMAL LOW (ref 78.0–100.0)
MONO ABS: 0.3 10*3/uL (ref 0.1–1.0)
Monocytes Relative: 7 % (ref 3–12)
NEUTROS ABS: 2.5 10*3/uL (ref 1.7–7.7)
Neutrophils Relative %: 53 % (ref 43–77)
Platelets: 68 10*3/uL — ABNORMAL LOW (ref 150–400)
RBC: 4.82 MIL/uL (ref 4.22–5.81)
RDW: 15.2 % (ref 11.5–15.5)
WBC: 4.7 10*3/uL (ref 4.0–10.5)

## 2015-09-05 ENCOUNTER — Telehealth: Payer: Self-pay | Admitting: Internal Medicine

## 2015-09-05 NOTE — Telephone Encounter (Signed)
[id number]  Patient's son came in wanting to know the status of form, [phone]. Patient states that the form was given on 1/20 Please follow up

## 2015-09-08 ENCOUNTER — Telehealth: Payer: Self-pay

## 2015-09-08 ENCOUNTER — Other Ambulatory Visit: Payer: Self-pay | Admitting: Internal Medicine

## 2015-09-08 DIAGNOSIS — R799 Abnormal finding of blood chemistry, unspecified: Secondary | ICD-10-CM

## 2015-09-08 NOTE — Telephone Encounter (Signed)
Spoke with patient He has an appointment scheduled for next week so  He will do the additional blood work at that time And the PPD test

## 2015-09-08 NOTE — Telephone Encounter (Signed)
Called patient this am to make him aware his forms are ready to be picked up Forms will be at the front desk

## 2015-09-11 ENCOUNTER — Ambulatory Visit: Payer: Self-pay | Attending: Podiatry

## 2015-09-11 ENCOUNTER — Other Ambulatory Visit: Payer: Self-pay | Admitting: Sports Medicine

## 2015-09-11 DIAGNOSIS — B353 Tinea pedis: Secondary | ICD-10-CM

## 2015-09-11 MED ORDER — CLOTRIMAZOLE 1 % EX SOLN: CUTANEOUS | Status: DC

## 2015-09-12 MED FILL — CLOTRIMAZOLE 1% SOLUTION: 1 | 15 days supply | Qty: 30 | Fill #0

## 2015-09-14 ENCOUNTER — Ambulatory Visit: Payer: Self-pay | Attending: Internal Medicine | Admitting: Pharmacist

## 2015-09-14 ENCOUNTER — Encounter: Payer: Self-pay | Admitting: Pharmacist

## 2015-09-14 VITALS — BP 133/84 | HR 68

## 2015-09-14 DIAGNOSIS — Z794 Long term (current) use of insulin: Secondary | ICD-10-CM

## 2015-09-14 DIAGNOSIS — E119 Type 2 diabetes mellitus without complications: Secondary | ICD-10-CM

## 2015-09-14 LAB — POCT URINALYSIS DIPSTICK
BILIRUBIN UA: NEGATIVE
GLUCOSE UA: 500
KETONES UA: NEGATIVE
Leukocytes, UA: NEGATIVE
Nitrite, UA: NEGATIVE
PROTEIN UA: 100
SPEC GRAV UA: 1.01
Urobilinogen, UA: 0.2
pH, UA: 6

## 2015-09-14 NOTE — Progress Notes (Signed)
S:    Patient arrives in good spirits with his son.  Presents for diabetes evaluation, education, and management at the request of Chari Manning, NP.  Patient was referred on 08/25/15.  Patient was last seen by Primary Care Provider on 08/25/15.   Patient reports adherence with medications.  Current diabetes medications include: Novolin 70/30 30 units in the morning and 10 units at night.  Current hypertension medications include: amlodipine 10 mg daily and lisinopril 10 mg daily.   Patient denies hypoglycemic events.  Patient reported dietary habits: patient is noncompliant with diet and will drink a lot of milk and eat a lot of bread.  Patient reported exercise habits: none   Patient reports nocturia 1-2, up to 3 times per night.  Patient denies neuropathy. Patient denies visual changes. Patient reports self foot exams.   Patient will likely be returning home in the next few months.    O:  Lab Results  Component Value Date   HGBA1C 9.20 08/25/2015   Filed Vitals:   09/14/15 1456  BP: 133/84  Pulse: 68    Home fasting CBG: 118 - 403 (most <230) 2 hour post-prandial/random CBG: 103 - 402 (most <250).   A/P: Diabetes currently uncontrolled based on A1c of 9.2 and home CBGs. Patient denies hypoglycemic events and is able to verbalize appropriate hypoglycemia management plan. Patient reports adherence with medication. Control is suboptimal due to dietary indiscretion.  Patient refused insulin as he gave his 70/30 insulin to him self an hour ago. I explained that the short acting insulin had had enough time to work and that his blood glucose was still elevated but patient refused. Urinalysis negative for ketones.  Continue Novolin 70/30 30 units in the morning and 10 units at night. Patient has multiple controlled blood glucose readings so increasing the dose will likely cause hypoglycemia. This is particularly concerning since patient is expecting to go home soon. Stressed the  importance of watching carb intake and decreasing the amount of milk and bread he consumes. Patient to monitor blood glucose every day and bring meter or log book to each visit. Provided a month's worth of blood glucose logs. Also provided diabetes education information in Arabic.  Next A1C anticipated April 2017.    Hypertension currently controlled on amlodipine 10 mg and lisinopril 10 mg daily..  Patient reports adherence with medication. Continue all medications as directed.  Written patient instructions provided.  Total time in face to face counseling 30 minutes.  Follow up in Pharmacist Clinic Visit in 1 month with more blood glucose readings if patient is still here.

## 2015-09-14 NOTE — Patient Instructions (Signed)
Thank you for coming to see me  Come back to see me in 4 weeks

## 2015-10-03 MED FILL — AMLODIPINE BESYLATE 10 MG T: 10 | 30 days supply | Qty: 30 | Fill #1

## 2015-10-03 MED FILL — ?LISINOPRIL 10 MG TABLET: 10 | 30 days supply | Qty: 30 | Fill #1

## 2015-11-07 MED FILL — ?LISINOPRIL 10 MG TABLET: 10 | 30 days supply | Qty: 30 | Fill #2

## 2015-11-07 MED FILL — AMLODIPINE BESYLATE 10 MG T: 10 | 30 days supply | Qty: 30 | Fill #2

## 2015-12-19 MED FILL — LISINOPRIL 10 MG TABLET: 10 | 30 days supply | Qty: 30 | Fill #3

## 2015-12-19 MED FILL — AMLODIPINE BESYLATE 10 MG T: 10 | 30 days supply | Qty: 30 | Fill #3

## 2016-05-07 ENCOUNTER — Other Ambulatory Visit: Payer: Self-pay | Admitting: Internal Medicine

## 2016-05-07 MED FILL — AMLODIPINE BESYLATE 10 MG T: 10 | 30 days supply | Qty: 30 | Fill #4

## 2016-05-15 ENCOUNTER — Ambulatory Visit: Payer: Self-pay | Admitting: Family Medicine

## 2017-06-20 ENCOUNTER — Ambulatory Visit: Payer: Medicaid Other | Attending: Nurse Practitioner | Admitting: Nurse Practitioner

## 2017-06-20 ENCOUNTER — Encounter: Payer: Self-pay | Admitting: Nurse Practitioner

## 2017-06-20 VITALS — BP 173/79 | HR 87 | Temp 98.5°F | Resp 18 | Ht 65.0 in | Wt 142.2 lb

## 2017-06-20 DIAGNOSIS — Z9889 Other specified postprocedural states: Secondary | ICD-10-CM | POA: Diagnosis not present

## 2017-06-20 DIAGNOSIS — E119 Type 2 diabetes mellitus without complications: Secondary | ICD-10-CM | POA: Diagnosis not present

## 2017-06-20 DIAGNOSIS — Z79899 Other long term (current) drug therapy: Secondary | ICD-10-CM | POA: Insufficient documentation

## 2017-06-20 DIAGNOSIS — I10 Essential (primary) hypertension: Secondary | ICD-10-CM | POA: Insufficient documentation

## 2017-06-20 DIAGNOSIS — E11649 Type 2 diabetes mellitus with hypoglycemia without coma: Secondary | ICD-10-CM | POA: Diagnosis not present

## 2017-06-20 DIAGNOSIS — Z794 Long term (current) use of insulin: Secondary | ICD-10-CM | POA: Insufficient documentation

## 2017-06-20 LAB — GLUCOSE, POCT (MANUAL RESULT ENTRY): POC GLUCOSE: 222 mg/dL — AB (ref 70–99)

## 2017-06-20 LAB — POCT GLYCOSYLATED HEMOGLOBIN (HGB A1C): Hemoglobin A1C: 8.1

## 2017-06-20 MED ORDER — LISINOPRIL 10 MG PO TABS: 10.0000 mg | ORAL_TABLET | Freq: Every day | ORAL | 0 refills | Status: DC

## 2017-06-20 MED ORDER — AMLODIPINE BESYLATE 10 MG PO TABS: 10.0000 mg | ORAL_TABLET | Freq: Every day | ORAL | 0 refills | Status: DC

## 2017-06-20 MED ORDER — AMLODIPINE BESYLATE 10 MG PO TABS: 10.0000 mg | ORAL_TABLET | Freq: Every day | ORAL | 4 refills | Status: DC

## 2017-06-20 NOTE — Patient Instructions (Addendum)
You can try OTC zantac or pepcid. Take one every morning 30 minutes before you eat.   Blood Glucose Monitoring, Adult Monitoring your blood sugar (glucose) helps you manage your diabetes. It also helps you and your health care provider determine how well your diabetes management plan is working. Blood glucose monitoring involves checking your blood glucose as often as directed, and keeping a record (log) of your results over time. Why should I monitor my blood glucose? Checking your blood glucose regularly can:  Help you understand how food, exercise, illnesses, and medicines affect your blood glucose.  Let you know what your blood glucose is at any time. You can quickly tell if you are having low blood glucose (hypoglycemia) or high blood glucose (hyperglycemia).  Help you and your health care provider adjust your medicines as needed.  When should I check my blood glucose? Follow instructions from your health care provider about how often to check your blood glucose. This may depend on:  The type of diabetes you have.  How well-controlled your diabetes is.  Medicines you are taking.  If you have type 1 diabetes:  Check your blood glucose at least 2 times a day.  Also check your blood glucose: ? Before every insulin injection. ? Before and after exercise. ? Between meals. ? 2 hours after a meal. ? Occasionally between 2:00 a.m. and 3:00 a.m., as directed. ? Before potentially dangerous tasks, like driving or using heavy machinery. ? At bedtime.  You may need to check your blood glucose more often, up to 6-10 times a day: ? If you use an insulin pump. ? If you need multiple daily injections (MDI). ? If your diabetes is not well-controlled. ? If you are ill. ? If you have a history of severe hypoglycemia. ? If you have a history of not knowing when your blood glucose is getting low (hypoglycemia unawareness). If you have type 2 diabetes:  If you take insulin or other  diabetes medicines, check your blood glucose at least 2 times a day.  If you are on intensive insulin therapy, check your blood glucose at least 4 times a day. Occasionally, you may also need to check between 2:00 a.m. and 3:00 a.m., as directed.  Also check your blood glucose: ? Before and after exercise. ? Before potentially dangerous tasks, like driving or using heavy machinery.  You may need to check your blood glucose more often if: ? Your medicine is being adjusted. ? Your diabetes is not well-controlled. ? You are ill. What is a blood glucose log?  A blood glucose log is a record of your blood glucose readings. It helps you and your health care provider: ? Look for patterns in your blood glucose over time. ? Adjust your diabetes management plan as needed.  Every time you check your blood glucose, write down your result and notes about things that may be affecting your blood glucose, such as your diet and exercise for the day.  Most glucose meters store a record of glucose readings in the meter. Some meters allow you to download your records to a computer. How do I check my blood glucose? Follow these steps to get accurate readings of your blood glucose: Supplies needed   Blood glucose meter.  Test strips for your meter. Each meter has its own strips. You must use the strips that come with your meter.  A needle to prick your finger (lancet). Do not use lancets more than once.  A device that  holds the lancet (lancing device).  A journal or log book to write down your results. Procedure  Wash your hands with soap and water.  Prick the side of your finger (not the tip) with the lancet. Use a different finger each time.  Gently rub the finger until a small drop of blood appears.  Follow instructions that come with your meter for inserting the test strip, applying blood to the strip, and using your blood glucose meter.  Write down your result and any notes. Alternative  testing sites  Some meters allow you to use areas of your body other than your finger (alternative sites) to test your blood.  If you think you may have hypoglycemia, or if you have hypoglycemia unawareness, do not use alternative sites. Use your finger instead.  Alternative sites may not be as accurate as the fingers, because blood flow is slower in these areas. This means that the result you get may be delayed, and it may be different from the result that you would get from your finger.  The most common alternative sites are: ? Forearm. ? Thigh. ? Palm of the hand. Additional tips  Always keep your supplies with you.  If you have questions or need help, all blood glucose meters have a 24-hour "hotline" number that you can call. You may also contact your health care provider.  After you use a few boxes of test strips, adjust (calibrate) your blood glucose meter by following instructions that came with your meter. This information is not intended to replace advice given to you by your health care provider. Make sure you discuss any questions you have with your health care provider. Document Released: 07/25/2003 Document Revised: 02/09/2016 Document Reviewed: 01/01/2016 Elsevier Interactive Patient Education  2017 [place] Diet A bland diet consists of foods that do not have a lot of fat or fiber. Foods without fat or fiber are easier for the body to digest. They are also less likely to irritate your mouth, throat, stomach, and other parts of your gastrointestinal tract. A bland diet is sometimes called a BRAT diet. What is my plan? Your health care provider or dietitian may recommend specific changes to your diet to prevent and treat your symptoms, such as:  Eating small meals often.  Cooking food until it is soft enough to chew easily.  Chewing your food well.  Drinking fluids slowly.  Not eating foods that are very spicy, sour, or fatty.  Not eating citrus fruits,  such as oranges and grapefruit.  What do I need to know about this diet?  Eat a variety of foods from the bland diet food list.  Do not follow a bland diet longer than you have to.  Ask your health care provider whether you should take vitamins. What foods can I eat? Grains  Hot cereals, such as cream of wheat. Bread, crackers, or tortillas made from refined white flour. Rice. Vegetables Canned or cooked vegetables. Mashed or boiled potatoes. Fruits Bananas. Applesauce. Other types of cooked or canned fruit with the skin and seeds removed, such as canned peaches or pears. Meats and Other Protein Sources Scrambled eggs. Creamy peanut butter or other nut butters. Lean, well-cooked meats, such as chicken or fish. Tofu. Soups or broths. Dairy Low-fat dairy products, such as milk, cottage cheese, or yogurt. Beverages Water. Herbal tea. Apple juice. Sweets and Desserts Pudding. Custard. Fruit gelatin. Ice cream. Fats and Oils Mild salad dressings. Canola or olive oil. The items listed above  may not be a complete list of allowed foods or beverages. Contact your dietitian for more options. What foods are not recommended? Foods and ingredients that are often not recommended include:  Spicy foods, such as hot sauce or salsa.  Fried foods.  Sour foods, such as pickled or fermented foods.  Raw vegetables or fruits, especially citrus or berries.  Caffeinated drinks.  Alcohol.  Strongly flavored seasonings or condiments.  The items listed above may not be a complete list of foods and beverages that are not allowed. Contact your dietitian for more information. This information is not intended to replace advice given to you by your health care provider. Make sure you discuss any questions you have with your health care provider. Document Released: 11/13/2015 Document Revised: 12/28/2015 Document Reviewed: 08/03/2014 Elsevier Interactive Patient Education  2018 [place] for Gastroesophageal Reflux Disease, Adult When you have gastroesophageal reflux disease (GERD), the foods you eat and your eating habits are very important. Choosing the right foods can help ease your discomfort. What guidelines do I need to follow?  Choose fruits, vegetables, whole grains, and low-fat dairy products.  Choose low-fat meat, fish, and poultry.  Limit fats such as oils, salad dressings, butter, nuts, and avocado.  Keep a food diary. This helps you identify foods that cause symptoms.  Avoid foods that cause symptoms. These may be different for everyone.  Eat small meals often instead of 3 large meals a day.  Eat your meals slowly, in a place where you are relaxed.  Limit fried foods.  Cook foods using methods other than frying.  Avoid drinking alcohol.  Avoid drinking large amounts of liquids with your meals.  Avoid bending over or lying down until 2-3 hours after eating. What foods are not recommended? These are some foods and drinks that may make your symptoms worse: Vegetables Tomatoes. Tomato juice. Tomato and spaghetti sauce. Chili peppers. Onion and garlic. Horseradish. Fruits Oranges, grapefruit, and lemon (fruit and juice). Meats High-fat meats, fish, and poultry. This includes hot dogs, ribs, ham, sausage, salami, and bacon. Dairy Whole milk and chocolate milk. Sour cream. Cream. Butter. Ice cream. Cream cheese. Drinks Coffee and tea. Bubbly (carbonated) drinks or energy drinks. Condiments Hot sauce. Barbecue sauce. Sweets/Desserts Chocolate and cocoa. Donuts. Peppermint and spearmint. Fats and Oils High-fat foods. This includes [place] fries and potato chips. Other Vinegar. Strong spices. This includes black pepper, white pepper, red pepper, cayenne, curry powder, cloves, ginger, and chili powder. The items listed above may not be a complete list of foods and drinks to avoid. Contact your dietitian for more information. This information  is not intended to replace advice given to you by your health care provider. Make sure you discuss any questions you have with your health care provider. Document Released: 01/21/2012 Document Revised: 12/28/2015 Document Reviewed: 05/26/2013 Elsevier Interactive Patient Education  2017 [employer]  Heartburn Heartburn is a type of pain or discomfort that can happen in the throat or chest. It is often described as a burning pain. It may also cause a bad taste in the mouth. Heartburn may feel worse when you lie down or bend over. It may be caused by stomach contents that move back up (reflux) into the tube that connects the mouth with the stomach (esophagus). Follow these instructions at home: Take these actions to lessen your discomfort and to help avoid problems. Diet  Follow a diet as told by your doctor. You may need to avoid foods and drinks  such as: ? Coffee and tea (with or without caffeine). ? Drinks that contain alcohol. ? Energy drinks and sports drinks. ? Carbonated drinks or sodas. ? Chocolate and cocoa. ? Peppermint and mint flavorings. ? Garlic and onions. ? Horseradish. ? Spicy and acidic foods, such as peppers, chili powder, curry powder, vinegar, hot sauces, and BBQ sauce. ? Citrus fruit juices and citrus fruits, such as oranges, lemons, and limes. ? Tomato-based foods, such as red sauce, chili, salsa, and pizza with red sauce. ? Fried and fatty foods, such as donuts, french fries, potato chips, and high-fat dressings. ? High-fat meats, such as hot dogs, rib eye steak, sausage, ham, and bacon. ? High-fat dairy items, such as whole milk, butter, and cream cheese.  Eat small meals often. Avoid eating large meals.  Avoid drinking large amounts of liquid with your meals.  Avoid eating meals during the 2-3 hours before bedtime.  Avoid lying down right after you eat.  Do not exercise right after you eat. General instructions  Pay attention to any changes in your  symptoms.  Take over-the-counter and prescription medicines only as told by your doctor. Do not take aspirin, ibuprofen, or other NSAIDs unless your doctor says it is okay.  Do not use any tobacco products, including cigarettes, chewing tobacco, and e-cigarettes. If you need help quitting, ask your doctor.  Wear loose clothes. Do not wear anything tight around your waist.  Raise (elevate) the head of your bed about 6 inches (15 cm).  Try to lower your stress. If you need help doing this, ask your doctor.  If you are overweight, lose an amount of weight that is healthy for you. Ask your doctor about a safe weight loss goal.  Keep all follow-up visits as told by your doctor. This is important. Contact a doctor if:  You have new symptoms.  You lose weight and you do not know why it is happening.  You have trouble swallowing, or it hurts to swallow.  You have wheezing or a cough that keeps happening.  Your symptoms do not get better with treatment.  You have heartburn often for more than two weeks. Get help right away if:  You have pain in your arms, neck, jaw, teeth, or back.  You feel sweaty, dizzy, or light-headed.  You have chest pain or shortness of breath.  You throw up (vomit) and your throw up looks like blood or coffee grounds.  Your poop (stool) is bloody or black. This information is not intended to replace advice given to you by your health care provider. Make sure you discuss any questions you have with your health care provider. Document Released: 04/03/2011 Document Revised: 12/28/2015 Document Reviewed: 11/16/2014 Elsevier Interactive Patient Education  [employer].

## 2017-06-20 NOTE — Progress Notes (Signed)
Assessment & Plan:  William [name] was seen today for establish care.  Diagnoses and all orders for this visit:  Essential hypertension -     CMP14+EGFR -     Lipid panel -     Microalbumin / creatinine urine ratio -     CBC -     lisinopril (PRINIVIL,ZESTRIL) 10 MG tablet; Take 1 tablet (10 mg total) daily by mouth. -     amLODipine (NORVASC) 10 MG tablet; Take 1 tablet (10 mg total) daily by mouth. Check blood pressure daily at the same time. Keep a log of all blood pressure readings.  DASH DIET; No salt or low sodium diet  If pressure if greater than 150/100 notify me here at the office.  If it's persistently greater 180/90, go to the Emergency Department.  Take all medication as prescribed.  Avoid smoked meats which are high in sodium content.  Avoid soda which contains sodium and are high in sugar which increases your risk for diabetes.  DASH DIET  Type 2 diabetes mellitus without complication, with long-term current use of insulin (HCC) -     Glucose (CBG) -     HgB A1c -     CMP14+EGFR -     Lipid panel -     Microalbumin / creatinine urine ratio -     CBC  Exercise at least 150 minutes per week. Continue medications. Keep blood sugar logs as instructed.  Aim for 2-3 Carb Choices per meal (30-45 grams) +/- 1 either way  Aim for 0-15 Carbs per snack if hungry  Include protein in moderation with your meals and snacks  Consider reading food labels for Total Carbohydrate and Fat Grams of foods  Consider checking BG at alternate times per day  Continue taking medication as directed Be mindful about how much sugar you are adding to beverages and other foods. Fruit Punch - find one with no sugar  Measure and decrease portions of carbohydrate foods  Make your plate and don't go back for seconds   Subjective:   Chief Complaint  Patient presents with  . Establish Care    Patient stated that he is not taking his blood pressure medication and needs refill.  Patient son is in the room translating for him.   Patient seen in office today to establish care. His son is accompanying him today as his interpreter. William [name] is from [place] and travels back and forth several times a year between his home country and the [place]. He has a history of HTN and DM. He has not taken his blood pressure medication in over a month. His son reports he just stopped taking it because he did not want to take it anymore.    Hypertension Patient here for follow-up of hypertension. He is not exercising and is adherent to low salt diet.  He does not have his blood pressure log with him today. He does not check his BP at home.   Cardiac symptoms none. Patient denies fatigue, lower extremity edema and headaches.  Cardiovascular risk factors: advanced age (older than 72 for men, 69 for women), diabetes mellitus and male gender. Use of agents associated with hypertension: none.  History of target organ damage: none. BP Readings from Last 3 Encounters: 06/20/17 : (!) 173/79 09/14/15 : 133/84 08/31/15 : (!) 162/78   Diabetes Mellitus Type II Patient here for follow-up of Type 2 diabetes mellitus.  Current symptoms/problems include intermittent  hypoglycemia 80-90s. Some readings as high as  160s  Known diabetic complications: none Current diabetic medications include: Novolog 70/30 Eye exam current (within one year): no Weight trend: stable Is He on ACE inhibitor or angiotensin II receptor blocker?  Yes  Lab Results      Component                Value               Date                      HGBA1C                   8.1                 06/20/2017                HGBA1C                   9.20                08/25/2015                HGBA1C                   9.5 (H)             06/25/2012                 Past Medical History:  Diagnosis Date  . Diabetes mellitus without complication ([place])   . Hypertension     Past Surgical History:  Procedure Laterality Date  . NECK  SURGERY      History reviewed. No pertinent family history.  Social History   Socioeconomic History  . Marital status: Single    Spouse name: Not on file  . Number of children: Not on file  . Years of education: Not on file  . Highest education level: Not on file  Social Needs  . Financial resource strain: Not on file  . Food insecurity - worry: Not on file  . Food insecurity - inability: Not on file  . Transportation needs - medical: Not on file  . Transportation needs - non-medical: Not on file  Occupational History  . Not on file  Tobacco Use  . Smoking status: Never Smoker  . Smokeless tobacco: Never Used  Substance and Sexual Activity  . Alcohol use: No  . Drug use: Not on file  . Sexual activity: Not on file  Other Topics Concern  . Not on file  Social History Narrative  . Not on file    Outpatient Medications Prior to Visit  Medication Sig Dispense Refill  . insulin NPH-insulin regular (NOVOLIN 70/30) (70-30) 100 UNIT/ML injection Inject into the skin 2 (two) times daily with a meal. 30 units in am and 10 units at night    . Alpha-D-Galactosidase (ANTI-GAS) CAPS Take 30 capsules by mouth 3 (three) times daily as needed (gas). (Patient not taking: Reported on 06/20/2017) 30 capsule 0  . amLODipine (NORVASC) 10 MG tablet Take 1 tablet (10 mg total) by mouth daily. (Patient not taking: Reported on 06/20/2017) 30 tablet 4  . clotrimazole (LOTRIMIN) 1 % external solution Apply a few drops twice daily in between toes on right foot (Patient not taking: Reported on 06/20/2017) 30 mL 0  . lisinopril (PRINIVIL,ZESTRIL) 10 MG tablet TAKE 1 TABLET BY MOUTH DAILY. (Patient not taking: Reported on 06/20/2017) 30 tablet 0   No facility-administered  medications prior to visit.     No Known Allergies  Review of Systems  Constitutional: Negative for fever, malaise/fatigue and weight loss.  HENT: Negative.  Negative for nosebleeds.   Eyes: Negative.  Negative for blurred vision,  double vision and photophobia.  Respiratory: Positive for cough. Negative for shortness of breath.   Cardiovascular: Negative.  Negative for chest pain, palpitations and leg swelling.  Gastrointestinal: Positive for heartburn. Negative for abdominal pain, constipation, diarrhea, nausea and vomiting.  Musculoskeletal: Negative.  Negative for myalgias.  Neurological: Negative.  Negative for dizziness, focal weakness, seizures and headaches.  Endo/Heme/Allergies: Negative for environmental allergies.  Psychiatric/Behavioral: Negative.  Negative for suicidal ideas.       Objective:    Physical Exam  Constitutional: He is oriented to person, place, and time. He appears well-developed and well-nourished. He is cooperative.  HENT:  Head: Normocephalic and atraumatic.  Eyes: EOM are normal.  Neck: Normal range of motion.  Cardiovascular: Normal rate, regular rhythm, normal heart sounds and intact distal pulses. Exam reveals no gallop and no friction rub.  No murmur heard. Pulmonary/Chest: Effort normal and breath sounds normal. No tachypnea. No respiratory distress. He has no decreased breath sounds. He has no wheezes. He has no rhonchi. He has no rales. He exhibits no tenderness.  Abdominal: Soft. Bowel sounds are normal.  Musculoskeletal: Normal range of motion. He exhibits no edema.  Neurological: He is alert and oriented to person, place, and time. Coordination normal.  Skin: Skin is warm and dry.  Psychiatric: He has a normal mood and affect. His behavior is normal. Judgment and thought content normal.  Nursing note and vitals reviewed.   BP (!) 173/79 (BP Location: Right Arm, Patient Position: Sitting, Cuff Size: Normal)   Pulse 87   Temp 98.5 F (36.9 C) (Oral)   Resp 18   Ht 5' 5"  (1.651 m)   Wt 142 lb 3.2 oz (64.5 kg)   SpO2 95%   BMI 23.66 kg/m  Wt Readings from Last 3 Encounters:  06/20/17 142 lb 3.2 oz (64.5 kg)  08/25/15 147 lb 6.4 oz (66.9 kg)  01/27/13 143 lb 3 oz  (64.9 kg)    Patient has been counseled on age-appropriate routine health concerns for screening and prevention. These are reviewed and up-to-date. Referrals have been placed accordingly. Immunizations are up-to-date or declined.        Patient has been counseled extensively about nutrition and exercise as well as the importance of adherence with medications and regular follow-up. The patient was given clear instructions to go to ER or return to medical center if symptoms don't improve, worsen or new problems develop. The patient verbalized understanding.   Follow-up: No Follow-up on file.   Gildardo Pounds, FNP-BC [facility], [place]   06/20/2017, 4:58 PM

## 2017-06-21 LAB — CMP14+EGFR
ALT: 10 IU/L (ref 0–44)
AST: 20 IU/L (ref 0–40)
Albumin/Globulin Ratio: 1 — ABNORMAL LOW (ref 1.2–2.2)
Albumin: 3.3 g/dL — ABNORMAL LOW (ref 3.6–4.8)
Alkaline Phosphatase: 125 IU/L — ABNORMAL HIGH (ref 39–117)
BUN/Creatinine Ratio: 21 (ref 10–24)
BUN: 62 mg/dL — ABNORMAL HIGH (ref 8–27)
Bilirubin Total: 0.4 mg/dL (ref 0.0–1.2)
CALCIUM: 8.3 mg/dL — AB (ref 8.6–10.2)
CO2: 21 mmol/L (ref 20–29)
Chloride: 103 mmol/L (ref 96–106)
Creatinine, Ser: 3.01 mg/dL — ABNORMAL HIGH (ref 0.76–1.27)
GFR calc Af Amer: 24 mL/min/{1.73_m2} — ABNORMAL LOW (ref >59)
GFR calc non Af Amer: 21 mL/min/{1.73_m2} — ABNORMAL LOW (ref >59)
GLUCOSE: 241 mg/dL — AB (ref 65–99)
Globulin, Total: 3.3 g/dL (ref 1.5–4.5)
Potassium: 4.5 mmol/L (ref 3.5–5.2)
Sodium: 137 mmol/L (ref 134–144)
Total Protein: 6.6 g/dL (ref 6.0–8.5)

## 2017-06-21 LAB — CBC
HEMATOCRIT: 29.7 % — AB (ref 37.5–51.0)
Hemoglobin: 9.2 g/dL — ABNORMAL LOW (ref 13.0–17.7)
MCH: 23.5 pg — ABNORMAL LOW (ref 26.6–33.0)
MCHC: 31 g/dL — AB (ref 31.5–35.7)
MCV: 76 fL — ABNORMAL LOW (ref 79–97)
Platelets: 101 10*3/uL — ABNORMAL LOW (ref 150–379)
RBC: 3.92 x10E6/uL — ABNORMAL LOW (ref 4.14–5.80)
RDW: 16.2 % — ABNORMAL HIGH (ref 12.3–15.4)
WBC: 4.7 10*3/uL (ref 3.4–10.8)

## 2017-06-21 LAB — MICROALBUMIN / CREATININE URINE RATIO
Creatinine, Urine: 77.2 mg/dL
Microalb/Creat Ratio: 4309.8 mg/g creat — ABNORMAL HIGH (ref 0.0–30.0)
Microalbumin, Urine: 3327.2 ug/mL

## 2017-06-21 LAB — LIPID PANEL
CHOL/HDL RATIO: 6.3 ratio — AB (ref 0.0–5.0)
Cholesterol, Total: 213 mg/dL — ABNORMAL HIGH (ref 100–199)
HDL: 34 mg/dL — AB (ref >39)
LDL CALC: 136 mg/dL — AB (ref 0–99)
TRIGLYCERIDES: 216 mg/dL — AB (ref 0–149)
VLDL Cholesterol Cal: 43 mg/dL — ABNORMAL HIGH (ref 5–40)

## 2017-06-23 ENCOUNTER — Other Ambulatory Visit: Payer: Self-pay | Admitting: Nurse Practitioner

## 2017-06-23 DIAGNOSIS — I129 Hypertensive chronic kidney disease with stage 1 through stage 4 chronic kidney disease, or unspecified chronic kidney disease: Principal | ICD-10-CM

## 2017-06-23 DIAGNOSIS — N184 Chronic kidney disease, stage 4 (severe): Principal | ICD-10-CM

## 2017-06-23 DIAGNOSIS — D631 Anemia in chronic kidney disease: Secondary | ICD-10-CM

## 2017-06-23 DIAGNOSIS — E1122 Type 2 diabetes mellitus with diabetic chronic kidney disease: Secondary | ICD-10-CM

## 2017-06-30 ENCOUNTER — Other Ambulatory Visit: Payer: Self-pay | Admitting: Nurse Practitioner

## 2017-06-30 MED ORDER — HYDRALAZINE HCL 25 MG PO TABS: 25.0000 mg | ORAL_TABLET | Freq: Two times a day (BID) | ORAL | 2 refills | Status: DC

## 2017-06-30 NOTE — Progress Notes (Signed)
I spoke with William [name] over the phone regarding his lab results. He is aware that he is to stop taking lisinopril and start taking hydralazine BID. He was also instructed to make sure he is drinking plenty of water. His referrals were placed urgently last week. Awaiting scheduling at this time.

## 2017-08-15 ENCOUNTER — Other Ambulatory Visit: Payer: Medicaid Other

## 2017-08-20 DIAGNOSIS — D61818 Other pancytopenia: Secondary | ICD-10-CM

## 2017-08-20 HISTORY — DX: Other pancytopenia: D61.818

## 2017-08-21 DIAGNOSIS — I129 Hypertensive chronic kidney disease with stage 1 through stage 4 chronic kidney disease, or unspecified chronic kidney disease: Secondary | ICD-10-CM | POA: Diagnosis not present

## 2017-08-21 DIAGNOSIS — E1129 Type 2 diabetes mellitus with other diabetic kidney complication: Secondary | ICD-10-CM | POA: Diagnosis not present

## 2017-08-21 DIAGNOSIS — N184 Chronic kidney disease, stage 4 (severe): Secondary | ICD-10-CM | POA: Diagnosis not present

## 2017-08-21 DIAGNOSIS — D631 Anemia in chronic kidney disease: Secondary | ICD-10-CM | POA: Diagnosis not present

## 2017-08-21 DIAGNOSIS — R042 Hemoptysis: Secondary | ICD-10-CM | POA: Diagnosis not present

## 2017-08-21 DIAGNOSIS — N2581 Secondary hyperparathyroidism of renal origin: Secondary | ICD-10-CM | POA: Diagnosis not present

## 2017-08-26 DIAGNOSIS — D696 Thrombocytopenia, unspecified: Secondary | ICD-10-CM | POA: Diagnosis not present

## 2017-08-26 DIAGNOSIS — R042 Hemoptysis: Secondary | ICD-10-CM | POA: Diagnosis not present

## 2017-08-26 DIAGNOSIS — D509 Iron deficiency anemia, unspecified: Secondary | ICD-10-CM | POA: Diagnosis not present

## 2017-08-26 DIAGNOSIS — R748 Abnormal levels of other serum enzymes: Secondary | ICD-10-CM | POA: Diagnosis not present

## 2017-08-28 ENCOUNTER — Other Ambulatory Visit: Payer: Self-pay | Admitting: Gastroenterology

## 2017-08-28 DIAGNOSIS — D509 Iron deficiency anemia, unspecified: Secondary | ICD-10-CM

## 2017-09-02 ENCOUNTER — Other Ambulatory Visit: Payer: Self-pay | Admitting: Nephrology

## 2017-09-02 ENCOUNTER — Telehealth: Payer: Self-pay | Admitting: Internal Medicine

## 2017-09-02 DIAGNOSIS — N184 Chronic kidney disease, stage 4 (severe): Secondary | ICD-10-CM

## 2017-09-02 NOTE — Telephone Encounter (Signed)
Appt has been scheduled for the pt to see Dr. Julien Nordmann on 1/30 at 1145am. Pt aware to arrive 30 minutes early. Location given to the pt.

## 2017-09-03 ENCOUNTER — Telehealth: Payer: Self-pay

## 2017-09-03 ENCOUNTER — Inpatient Hospital Stay: Payer: Medicare Other

## 2017-09-03 ENCOUNTER — Inpatient Hospital Stay: Payer: Medicare Other | Attending: Internal Medicine | Admitting: Internal Medicine

## 2017-09-03 ENCOUNTER — Encounter: Payer: Self-pay | Admitting: Internal Medicine

## 2017-09-03 VITALS — BP 172/60 | HR 91 | Temp 97.7°F | Resp 21 | Ht 65.0 in | Wt 147.3 lb

## 2017-09-03 DIAGNOSIS — D61818 Other pancytopenia: Secondary | ICD-10-CM | POA: Insufficient documentation

## 2017-09-03 DIAGNOSIS — R05 Cough: Secondary | ICD-10-CM | POA: Diagnosis not present

## 2017-09-03 DIAGNOSIS — R042 Hemoptysis: Secondary | ICD-10-CM | POA: Diagnosis not present

## 2017-09-03 LAB — CBC WITH DIFFERENTIAL (CANCER CENTER ONLY)
BASOS ABS: 0 10*3/uL (ref 0.0–0.1)
BASOS PCT: 0 %
Eosinophils Absolute: 0.2 10*3/uL (ref 0.0–0.5)
Eosinophils Relative: 6 %
HCT: 24.5 % — ABNORMAL LOW (ref 38.4–49.9)
Hemoglobin: 8 g/dL — ABNORMAL LOW (ref 13.0–17.1)
LYMPHS PCT: 28 %
Lymphs Abs: 0.8 10*3/uL — ABNORMAL LOW (ref 0.9–3.3)
MCH: 24 pg — ABNORMAL LOW (ref 27.2–33.4)
MCHC: 32.7 g/dL (ref 32.0–36.0)
MCV: 73.4 fL — AB (ref 79.3–98.0)
MONO ABS: 0.2 10*3/uL (ref 0.1–0.9)
Monocytes Relative: 7 %
NEUTROS PCT: 59 %
Neutro Abs: 1.8 10*3/uL (ref 1.5–6.5)
Platelet Count: 60 10*3/uL — ABNORMAL LOW (ref 140–400)
RBC: 3.34 MIL/uL — AB (ref 4.20–5.82)
RDW: 14.2 % (ref 11.0–14.6)
WBC Count: 2.9 10*3/uL — ABNORMAL LOW (ref 4.0–10.3)

## 2017-09-03 LAB — VITAMIN B12: VITAMIN B 12: 997 pg/mL — AB (ref 180–914)

## 2017-09-03 LAB — IRON AND TIBC
Iron: 42 ug/dL (ref 42–163)
Saturation Ratios: 15 % — ABNORMAL LOW (ref 42–163)
TIBC: 289 ug/dL (ref 202–409)
UIBC: 247 ug/dL

## 2017-09-03 LAB — CMP (CANCER CENTER ONLY)
ALBUMIN: 3.1 g/dL — AB (ref 3.5–5.0)
ALT: 10 U/L (ref 0–55)
ANION GAP: 12 — AB (ref 3–11)
AST: 24 U/L (ref 5–34)
Alkaline Phosphatase: 108 U/L (ref 40–150)
BILIRUBIN TOTAL: 0.6 mg/dL (ref 0.2–1.2)
BUN: 81 mg/dL — ABNORMAL HIGH (ref 7–26)
CALCIUM: 8.6 mg/dL (ref 8.4–10.4)
CO2: 18 mmol/L — ABNORMAL LOW (ref 22–29)
Chloride: 104 mmol/L (ref 98–109)
Creatinine: 3.54 mg/dL (ref 0.70–1.30)
GFR, Est AFR Am: 19 mL/min — ABNORMAL LOW (ref >60)
GFR, Estimated: 17 mL/min — ABNORMAL LOW (ref >60)
GLUCOSE: 275 mg/dL — AB (ref 70–140)
Potassium: 5.2 mmol/L — ABNORMAL HIGH (ref 3.5–5.1)
SODIUM: 134 mmol/L — AB (ref 136–145)
TOTAL PROTEIN: 7 g/dL (ref 6.4–8.3)

## 2017-09-03 LAB — FOLATE: Folate: 22.2 ng/mL (ref >5.9)

## 2017-09-03 LAB — FERRITIN: Ferritin: 166 ng/mL (ref 22–316)

## 2017-09-03 LAB — LACTATE DEHYDROGENASE: LDH: 301 U/L — ABNORMAL HIGH (ref 125–245)

## 2017-09-03 LAB — RETICULOCYTES
RBC.: 3.34 MIL/uL — ABNORMAL LOW (ref 4.20–5.82)
RETIC COUNT ABSOLUTE: 33.4 10*3/uL — AB (ref 34.8–93.9)
Retic Ct Pct: 1 % (ref 0.8–1.8)

## 2017-09-03 NOTE — Progress Notes (Signed)
[name] Telephone:([phone]) [phone]   [phone]  CONSULT NOTE  REFERRING PHYSICIAN: Dr. Otis [name]  REASON FOR CONSULTATION:  67 years old African male with pancytopenia.  HPI William [name] is a 67 y.o. male with past medical history significant for hypertension, diabetes mellitus, chronic kidney disease as well as history of neck surgery.  The patient was seen at the [place] [facility] for evaluation of persistent cough.  Blood work showed significant anemia as well as chronic kidney disease.  He was referred to William [name] for evaluation of his kidney problem.  He was also seen by William [name] for evaluation of his anemia.  CBC performed in their office showed pancytopenia with total white blood count of 2.6, hemoglobin 7.8, hematocrit 24.7% and platelets count of 62.  The patient was referred to me today for evaluation and recommendation regarding his pancytopenia. He denied having any significant bleeding issues, no bruises or ecchymosis.  He denied having any recent fever or chills. He continues to have persistent cough productive of yellowish blood-tinged sputum.  He has this cough for several years.  It has been getting worse.  He denied having any chest pain but has shortness of breath with minimal exertion.  He has no weight loss or night sweats.  He has occasional frontal headache. Family history significant for father died from old age.  Mother in her 25s and she is still alive.  He has a brother with heart disease. The patient is married and has 3 children.  He was accompanied today by his son William [name].  He is to work in farming as well as Administrator.  He has no history for smoking, alcohol or drug abuse.  HPI  Past Medical History:  Diagnosis Date  . Diabetes mellitus without complication ([place])   . Hypertension     Past Surgical History:  Procedure Laterality Date  . NECK SURGERY      No family history on file.  Social  History Social History   Tobacco Use  . Smoking status: Never Smoker  . Smokeless tobacco: Never Used  Substance Use Topics  . Alcohol use: No  . Drug use: Not on file    No Known Allergies  Current Outpatient Medications  Medication Sig Dispense Refill  . amLODipine (NORVASC) 10 MG tablet Take 1 tablet (10 mg total) daily by mouth. 90 tablet 0  . hydrALAZINE (APRESOLINE) 25 MG tablet Take 1 tablet (25 mg total) by mouth 2 (two) times daily. 60 tablet 2  . insulin NPH-insulin regular (NOVOLIN 70/30) (70-30) 100 UNIT/ML injection Inject into the skin 2 (two) times daily with a meal. 30 units in am and 10 units at night     No current facility-administered medications for this visit.     Review of Systems  Constitutional: positive for fatigue Eyes: negative Ears, nose, mouth, throat, and face: negative Respiratory: positive for cough, dyspnea on exertion and hemoptysis Cardiovascular: negative Gastrointestinal: negative Genitourinary:negative Integument/breast: negative Hematologic/lymphatic: negative Musculoskeletal:negative Neurological: negative Behavioral/Psych: negative Endocrine: negative Allergic/Immunologic: negative  Physical Exam  TRZ:NBVAP, healthy, no distress, well nourished and well developed SKIN: skin color, texture, turgor are normal, no rashes or significant lesions HEAD: Normocephalic, No masses, lesions, tenderness or abnormalities EYES: normal, PERRLA, Conjunctiva are pink and non-injected EARS: External ears normal, Canals clear OROPHARYNX:no exudate, no erythema and lips, buccal mucosa, and tongue normal  NECK: supple, no adenopathy, no JVD LYMPH:  no palpable lymphadenopathy, no hepatosplenomegaly LUNGS: clear to  auscultation , and palpation HEART: regular rate & rhythm, no murmurs and no gallops ABDOMEN:abdomen soft, non-tender, normal bowel sounds and no masses or organomegaly BACK: Back symmetric, no curvature., No CVA  tenderness EXTREMITIES:no joint deformities, effusion, or inflammation, no edema, no skin discoloration  NEURO: alert & oriented x 3 with fluent speech, no focal motor/sensory deficits  PERFORMANCE STATUS: ECOG 1  LABORATORY DATA: Lab Results  Component Value Date   WBC 2.9 (L) 09/03/2017   HGB 9.2 (L) 06/20/2017   HCT 24.5 (L) 09/03/2017   MCV 73.4 (L) 09/03/2017   PLT 60 (L) 09/03/2017      Chemistry      Component Value Date/Time   NA 137 06/20/2017 1613   K 4.5 06/20/2017 1613   CL 103 06/20/2017 1613   CO2 21 06/20/2017 1613   BUN 62 (H) 06/20/2017 1613   CREATININE 3.01 (H) 06/20/2017 1613   CREATININE 1.23 08/31/2015 0859      Component Value Date/Time   CALCIUM 8.3 (L) 06/20/2017 1613   ALKPHOS 125 (H) 06/20/2017 1613   AST 20 06/20/2017 1613   ALT 10 06/20/2017 1613   BILITOT 0.4 06/20/2017 1613       RADIOGRAPHIC STUDIES: No results found.  ASSESSMENT: This is a very pleasant 67 years old [place] male presented with pancytopenia of unclear etiology as well as persistent cough and hemoptysis for several years.   PLAN: I had a lengthy discussion with the patient and his son today about his current condition and further investigation to confirm a diagnosis. I order several studies today including repeat CBC, comprehensive metabolic panel, LDH, iron study, ferritin, serum folate, B12 level, quantitative immunoglobulin with immune fixation. I would also arrange for the patient to have a bone marrow biopsy and aspirate performed in the next few days. For the persistent cough and hemoptysis, I will order CT scan of the chest to rule out any underlying malignancy or the chest abnormality. I will arrange for the patient to come back for follow-up visit in less than 2 weeks for reevaluation and discussion of his lab and the scan results and further recommendation regarding his condition. The patient was advised to call immediately if he has any concerning symptoms in  the interval. The patient voices understanding of current disease status and treatment options and is in agreement with the current care plan.  All questions were answered. The patient knows to call the clinic with any problems, questions or concerns. We can certainly see the patient much sooner if necessary.  Thank you so much for allowing me to participate in the care of William [name]. I will continue to follow up the patient with you and assist in his care.  I spent 40 minutes counseling the patient face to face. The total time spent in the appointment was 60 minutes.  Disclaimer: This note was dictated with voice recognition software. Similar sounding words can inadvertently be transcribed and may not be corrected upon review.   [name] September 03, 2017, 12:03 PM

## 2017-09-03 NOTE — Telephone Encounter (Signed)
Printed avs and calender for upcoming appointment. Per 1/30 los

## 2017-09-04 ENCOUNTER — Other Ambulatory Visit: Payer: Self-pay | Admitting: Gastroenterology

## 2017-09-04 ENCOUNTER — Other Ambulatory Visit: Payer: Self-pay | Admitting: Adult Health

## 2017-09-04 ENCOUNTER — Ambulatory Visit
Admission: RE | Admit: 2017-09-04 | Discharge: 2017-09-04 | Disposition: A | Discharge status: Home / Self Care | Payer: Medicaid Other | Source: Ambulatory Visit | Attending: Gastroenterology | Admitting: Gastroenterology

## 2017-09-04 DIAGNOSIS — D509 Iron deficiency anemia, unspecified: Secondary | ICD-10-CM

## 2017-09-04 DIAGNOSIS — D708 Other neutropenia: Secondary | ICD-10-CM

## 2017-09-04 DIAGNOSIS — N189 Chronic kidney disease, unspecified: Secondary | ICD-10-CM | POA: Diagnosis not present

## 2017-09-04 LAB — IGG, IGA, IGM
IGA: 304 mg/dL (ref 61–437)
IGM (IMMUNOGLOBULIN M), SRM: 63 mg/dL (ref 20–172)
IgG (Immunoglobin G), Serum: 1505 mg/dL (ref 700–1600)

## 2017-09-05 ENCOUNTER — Inpatient Hospital Stay: Payer: Medicare Other | Attending: Internal Medicine | Admitting: Internal Medicine

## 2017-09-05 ENCOUNTER — Other Ambulatory Visit: Payer: Medicaid Other

## 2017-09-05 ENCOUNTER — Inpatient Hospital Stay: Payer: Medicare Other

## 2017-09-05 DIAGNOSIS — R0601 Orthopnea: Secondary | ICD-10-CM | POA: Diagnosis not present

## 2017-09-05 DIAGNOSIS — E119 Type 2 diabetes mellitus without complications: Secondary | ICD-10-CM | POA: Diagnosis not present

## 2017-09-05 DIAGNOSIS — R05 Cough: Secondary | ICD-10-CM | POA: Insufficient documentation

## 2017-09-05 DIAGNOSIS — D708 Other neutropenia: Secondary | ICD-10-CM

## 2017-09-05 DIAGNOSIS — I10 Essential (primary) hypertension: Secondary | ICD-10-CM | POA: Insufficient documentation

## 2017-09-05 DIAGNOSIS — R5383 Other fatigue: Secondary | ICD-10-CM | POA: Diagnosis not present

## 2017-09-05 DIAGNOSIS — N189 Chronic kidney disease, unspecified: Secondary | ICD-10-CM | POA: Diagnosis not present

## 2017-09-05 DIAGNOSIS — D61818 Other pancytopenia: Secondary | ICD-10-CM | POA: Diagnosis not present

## 2017-09-05 DIAGNOSIS — R531 Weakness: Secondary | ICD-10-CM | POA: Insufficient documentation

## 2017-09-05 DIAGNOSIS — D649 Anemia, unspecified: Secondary | ICD-10-CM | POA: Insufficient documentation

## 2017-09-05 LAB — CBC WITH DIFFERENTIAL (CANCER CENTER ONLY)
BASOS ABS: 0 10*3/uL (ref 0.0–0.1)
BASOS PCT: 0 %
Eosinophils Absolute: 0.1 10*3/uL (ref 0.0–0.5)
Eosinophils Relative: 4 %
HEMATOCRIT: 23.4 % — AB (ref 38.4–49.9)
Hemoglobin: 7.4 g/dL — ABNORMAL LOW (ref 13.0–17.1)
Lymphocytes Relative: 21 %
Lymphs Abs: 0.6 10*3/uL — ABNORMAL LOW (ref 0.9–3.3)
MCH: 23.4 pg — ABNORMAL LOW (ref 27.2–33.4)
MCHC: 31.8 g/dL — ABNORMAL LOW (ref 32.0–36.0)
MCV: 73.7 fL — ABNORMAL LOW (ref 79.3–98.0)
Monocytes Absolute: 0.4 10*3/uL (ref 0.1–0.9)
Monocytes Relative: 14 %
NEUTROS ABS: 1.6 10*3/uL (ref 1.5–6.5)
NEUTROS PCT: 61 %
Platelet Count: 64 10*3/uL — ABNORMAL LOW (ref 140–400)
RBC: 3.18 MIL/uL — ABNORMAL LOW (ref 4.20–5.82)
RDW: 14.3 % (ref 11.0–14.6)
WBC: 2.7 10*3/uL — AB (ref 4.0–10.3)

## 2017-09-05 NOTE — Progress Notes (Unsigned)
Pt VSS post 30 mins observation, dressing dry and intact.

## 2017-09-05 NOTE — Progress Notes (Addendum)
INDICATION: pancytopenia   Bone Marrow Biopsy and Aspiration Procedure Note   Informed consent was obtained and potential risks including bleeding, infection and pain were reviewed with the patient.  The patient's name, date of birth, identification, consent and allergies were verified prior to the start of procedure and time out was performed.  The right posterior iliac crest was chosen as the site of biopsy.  The skin was prepped with ChloraPrep.   8 cc of 2% lidocaine was used to provide local anaesthesia.   10 cc of bone marrow aspirate was obtained followed by 1cm biopsy.  Pressure was applied to the biopsy site and bandage was placed over the biopsy site. Patient reports no discomfort after procedure.  Tolerated procedure well. Patient was made to lie on the back for 30 mins prior to discharge.  COMPLICATIONS: None BLOOD LOSS: none The patient was discharged home in stable condition with a 1 week follow up to review results.  Patient was provided with post bone marrow biopsy instructions and instructed to call if there was any bleeding or worsening pain.  Specimens sent for flow cytometry, cytogenetics and additional studies.  Signed Scot Dock, NP  ADDENDUM: Hematology/Oncology Attending: I had a face-to-face encounter with the patient.  I supervised the whole procedure.  Bone marrow biopsy and aspirate were performed today for evaluation of pancytopenia. I will arrange for the patient a follow-up appointment with me for discussion of the results and recommendation regarding his condition.  Disclaimer: This note was dictated with voice recognition software. Similar sounding words can inadvertently be transcribed and may be missed upon review. [name], MD 09/05/17

## 2017-09-08 DIAGNOSIS — D509 Iron deficiency anemia, unspecified: Secondary | ICD-10-CM | POA: Diagnosis not present

## 2017-09-10 ENCOUNTER — Other Ambulatory Visit: Payer: Self-pay | Admitting: Nephrology

## 2017-09-10 DIAGNOSIS — N184 Chronic kidney disease, stage 4 (severe): Secondary | ICD-10-CM

## 2017-09-10 DIAGNOSIS — D509 Iron deficiency anemia, unspecified: Secondary | ICD-10-CM | POA: Diagnosis not present

## 2017-09-10 DIAGNOSIS — R05 Cough: Secondary | ICD-10-CM

## 2017-09-10 DIAGNOSIS — R059 Cough, unspecified: Secondary | ICD-10-CM

## 2017-09-11 ENCOUNTER — Other Ambulatory Visit (HOSPITAL_COMMUNITY): Payer: Self-pay

## 2017-09-11 NOTE — Discharge Instructions (Signed)
Epoetin Alfa injection What is this medicine? EPOETIN ALFA (e POE e tin AL fa) helps your body make more red blood cells. This medicine is used to treat anemia caused by chronic kidney failure, cancer chemotherapy, or HIV-therapy. It may also be used before surgery if you have anemia. This medicine may be used for other purposes; ask your health care provider or pharmacist if you have questions. COMMON BRAND NAME(S): Epogen, Procrit What should I tell my health care provider before I take this medicine? They need to know if you have any of these conditions: -blood clotting disorders -cancer patient not on chemotherapy -cystic fibrosis -heart disease, such as angina or heart failure -hemoglobin level of 12 g/dL or greater -high blood pressure -low levels of folate, iron, or vitamin B12 -seizures -an unusual or allergic reaction to erythropoietin, albumin, benzyl alcohol, hamster proteins, other medicines, foods, dyes, or preservatives -pregnant or trying to get pregnant -breast-feeding How should I use this medicine? This medicine is for injection into a vein or under the skin. It is usually given by a health care professional in a hospital or clinic setting. If you get this medicine at home, you will be taught how to prepare and give this medicine. Use exactly as directed. Take your medicine at regular intervals. Do not take your medicine more often than directed. It is important that you put your used needles and syringes in a special sharps container. Do not put them in a trash can. If you do not have a sharps container, call your pharmacist or healthcare provider to get one. A special MedGuide will be given to you by the pharmacist with each prescription and refill. Be sure to read this information carefully each time. Talk to your pediatrician regarding the use of this medicine in children. While this drug may be prescribed for selected conditions, precautions do apply. Overdosage: If you  think you have taken too much of this medicine contact a poison control center or emergency room at once. NOTE: This medicine is only for you. Do not share this medicine with others. What if I miss a dose? If you miss a dose, take it as soon as you can. If it is almost time for your next dose, take only that dose. Do not take double or extra doses. What may interact with this medicine? Do not take this medicine with any of the following medications: -darbepoetin alfa This list may not describe all possible interactions. Give your health care provider a list of all the medicines, herbs, non-prescription drugs, or dietary supplements you use. Also tell them if you smoke, drink alcohol, or use illegal drugs. Some items may interact with your medicine. What should I watch for while using this medicine? Your condition will be monitored carefully while you are receiving this medicine. You may need blood work done while you are taking this medicine. What side effects may I notice from receiving this medicine? Side effects that you should report to your doctor or health care professional as soon as possible: -allergic reactions like skin rash, itching or hives, swelling of the face, lips, or tongue -breathing problems -changes in vision -chest pain -confusion, trouble speaking or understanding -feeling faint or lightheaded, falls -high blood pressure -muscle aches or pains -pain, swelling, warmth in the leg -rapid weight gain -severe headaches -sudden numbness or weakness of the face, arm or leg -trouble walking, dizziness, loss of balance or coordination -seizures (convulsions) -swelling of the ankles, feet, hands -unusually weak or tired  Side effects that usually do not require medical attention (report to your doctor or health care professional if they continue or are bothersome): -diarrhea -fever, chills (flu-like symptoms) -headaches -nausea, vomiting -redness, stinging, or swelling at  site where injected This list may not describe all possible side effects. Call your doctor for medical advice about side effects. You may report side effects to FDA at 1-800-FDA-1088. Where should I keep my medicine? Keep out of the reach of children. Store in a refrigerator between 2 and 8 degrees C (36 and 46 degrees F). Do not freeze or shake. Throw away any unused portion if using a single-dose vial. Multi-dose vials can be kept in the refrigerator for up to 21 days after the initial dose. Throw away unused medicine. NOTE: This sheet is a summary. It may not cover all possible information. If you have questions about this medicine, talk to your doctor, pharmacist, or health care provider.  2018 Elsevier/Gold Standard (2016-03-11 19:42:31) Ferumoxytol injection What is this medicine? FERUMOXYTOL is an iron complex. Iron is used to make healthy red blood cells, which carry oxygen and nutrients throughout the body. This medicine is used to treat iron deficiency anemia in people with chronic kidney disease. This medicine may be used for other purposes; ask your health care provider or pharmacist if you have questions. COMMON BRAND NAME(S): Feraheme What should I tell my health care provider before I take this medicine? They need to know if you have any of these conditions: -anemia not caused by low iron levels -high levels of iron in the blood -magnetic resonance imaging (MRI) test scheduled -an unusual or allergic reaction to iron, other medicines, foods, dyes, or preservatives -pregnant or trying to get pregnant -breast-feeding How should I use this medicine? This medicine is for injection into a vein. It is given by a health care professional in a hospital or clinic setting. Talk to your pediatrician regarding the use of this medicine in children. Special care may be needed. Overdosage: If you think you have taken too much of this medicine contact a poison control center or emergency room  at once. NOTE: This medicine is only for you. Do not share this medicine with others. What if I miss a dose? It is important not to miss your dose. Call your doctor or health care professional if you are unable to keep an appointment. What may interact with this medicine? This medicine may interact with the following medications: -other iron products This list may not describe all possible interactions. Give your health care provider a list of all the medicines, herbs, non-prescription drugs, or dietary supplements you use. Also tell them if you smoke, drink alcohol, or use illegal drugs. Some items may interact with your medicine. What should I watch for while using this medicine? Visit your doctor or healthcare professional regularly. Tell your doctor or healthcare professional if your symptoms do not start to get better or if they get worse. You may need blood work done while you are taking this medicine. You may need to follow a special diet. Talk to your doctor. Foods that contain iron include: whole grains/cereals, dried fruits, beans, or peas, leafy green vegetables, and organ meats (liver, kidney). What side effects may I notice from receiving this medicine? Side effects that you should report to your doctor or health care professional as soon as possible: -allergic reactions like skin rash, itching or hives, swelling of the face, lips, or tongue -breathing problems -changes in blood pressure -feeling faint  or lightheaded, falls -fever or chills -flushing, sweating, or hot feelings -swelling of the ankles or feet Side effects that usually do not require medical attention (report to your doctor or health care professional if they continue or are bothersome): -diarrhea -headache -nausea, vomiting -stomach pain This list may not describe all possible side effects. Call your doctor for medical advice about side effects. You may report side effects to FDA at 1-800-FDA-1088. Where should  I keep my medicine? This drug is given in a hospital or clinic and will not be stored at home. NOTE: This sheet is a summary. It may not cover all possible information. If you have questions about this medicine, talk to your doctor, pharmacist, or health care provider.  2018 Elsevier/Gold Standard (2015-08-24 12:41:49)

## 2017-09-12 ENCOUNTER — Other Ambulatory Visit: Payer: Self-pay | Admitting: Medical Oncology

## 2017-09-12 ENCOUNTER — Ambulatory Visit (HOSPITAL_COMMUNITY)
Admission: RE | Admit: 2017-09-12 | Discharge: 2017-09-12 | Disposition: A | Discharge status: Home / Self Care | Payer: Medicare Other | Source: Ambulatory Visit | Attending: Nephrology | Admitting: Nephrology

## 2017-09-12 DIAGNOSIS — D631 Anemia in chronic kidney disease: Secondary | ICD-10-CM | POA: Diagnosis not present

## 2017-09-12 DIAGNOSIS — N184 Chronic kidney disease, stage 4 (severe): Secondary | ICD-10-CM | POA: Diagnosis not present

## 2017-09-12 DIAGNOSIS — D649 Anemia, unspecified: Secondary | ICD-10-CM

## 2017-09-12 LAB — POCT HEMOGLOBIN-HEMACUE: HEMOGLOBIN: 7.7 g/dL — AB (ref 13.0–17.0)

## 2017-09-12 MED ORDER — EPOETIN ALFA 20000 UNIT/ML IJ SOLN: 10000.0000 [IU] | INTRAMUSCULAR | Status: DC

## 2017-09-12 MED ORDER — EPOETIN ALFA 10000 UNIT/ML IJ SOLN
INTRAMUSCULAR | Status: AC
  Administered 2017-09-12: 10:00:00 10000 [IU]
  Filled 2017-09-12: qty 1

## 2017-09-12 MED ORDER — SODIUM CHLORIDE 0.9 % IV SOLN
510.0000 mg | INTRAVENOUS | Status: DC
  Administered 2017-09-12: 510 mg via INTRAVENOUS
  Filled 2017-09-12: qty 17

## 2017-09-15 ENCOUNTER — Inpatient Hospital Stay: Payer: Medicare Other

## 2017-09-15 ENCOUNTER — Ambulatory Visit (HOSPITAL_COMMUNITY)
Admission: RE | Admit: 2017-09-15 | Discharge: 2017-09-15 | Disposition: A | Discharge status: Home / Self Care | Payer: Medicare Other | Source: Ambulatory Visit | Attending: Nephrology | Admitting: Nephrology

## 2017-09-15 DIAGNOSIS — N184 Chronic kidney disease, stage 4 (severe): Secondary | ICD-10-CM | POA: Diagnosis not present

## 2017-09-15 DIAGNOSIS — D649 Anemia, unspecified: Secondary | ICD-10-CM | POA: Diagnosis not present

## 2017-09-15 DIAGNOSIS — N189 Chronic kidney disease, unspecified: Secondary | ICD-10-CM | POA: Diagnosis not present

## 2017-09-15 DIAGNOSIS — R05 Cough: Secondary | ICD-10-CM | POA: Insufficient documentation

## 2017-09-15 DIAGNOSIS — R059 Cough, unspecified: Secondary | ICD-10-CM

## 2017-09-15 DIAGNOSIS — D61818 Other pancytopenia: Secondary | ICD-10-CM | POA: Diagnosis not present

## 2017-09-15 DIAGNOSIS — R0601 Orthopnea: Secondary | ICD-10-CM | POA: Diagnosis not present

## 2017-09-15 DIAGNOSIS — R531 Weakness: Secondary | ICD-10-CM | POA: Diagnosis not present

## 2017-09-15 LAB — CBC WITH DIFFERENTIAL (CANCER CENTER ONLY)
BASOS PCT: 0 %
Basophils Absolute: 0 10*3/uL (ref 0.0–0.1)
Eosinophils Absolute: 0.1 10*3/uL (ref 0.0–0.5)
Eosinophils Relative: 4 %
HCT: 25.4 % — ABNORMAL LOW (ref 38.4–49.9)
Hemoglobin: 8 g/dL — ABNORMAL LOW (ref 13.0–17.1)
LYMPHS ABS: 0.9 10*3/uL (ref 0.9–3.3)
Lymphocytes Relative: 25 %
MCH: 23.6 pg — AB (ref 27.2–33.4)
MCHC: 31.5 g/dL — AB (ref 32.0–36.0)
MCV: 74.9 fL — ABNORMAL LOW (ref 79.3–98.0)
MONO ABS: 0.5 10*3/uL (ref 0.1–0.9)
MONOS PCT: 14 %
NEUTROS ABS: 1.9 10*3/uL (ref 1.5–6.5)
NEUTROS PCT: 57 %
Platelet Count: 63 10*3/uL — ABNORMAL LOW (ref 140–400)
RBC: 3.39 MIL/uL — ABNORMAL LOW (ref 4.20–5.82)
RDW: 14.9 % — AB (ref 11.0–14.6)
WBC Count: 3.4 10*3/uL — ABNORMAL LOW (ref 4.0–10.3)

## 2017-09-15 LAB — CMP (CANCER CENTER ONLY)
ALBUMIN: 3 g/dL — AB (ref 3.5–5.0)
ALT: 10 U/L (ref 0–55)
AST: 22 U/L (ref 5–34)
Alkaline Phosphatase: 101 U/L (ref 40–150)
Anion gap: 8 (ref 3–11)
BUN: 70 mg/dL — ABNORMAL HIGH (ref 7–26)
CALCIUM: 8.8 mg/dL (ref 8.4–10.4)
CHLORIDE: 110 mmol/L — AB (ref 98–109)
CO2: 19 mmol/L — AB (ref 22–29)
CREATININE: 3.53 mg/dL — AB (ref 0.70–1.30)
GFR, EST AFRICAN AMERICAN: 19 mL/min — AB (ref >60)
GFR, Estimated: 17 mL/min — ABNORMAL LOW (ref >60)
GLUCOSE: 149 mg/dL — AB (ref 70–140)
Potassium: 5.2 mmol/L — ABNORMAL HIGH (ref 3.5–5.1)
SODIUM: 137 mmol/L (ref 136–145)
Total Bilirubin: 0.6 mg/dL (ref 0.2–1.2)
Total Protein: 6.9 g/dL (ref 6.4–8.3)

## 2017-09-16 ENCOUNTER — Ambulatory Visit: Payer: Medicaid Other

## 2017-09-17 ENCOUNTER — Telehealth: Payer: Self-pay

## 2017-09-17 ENCOUNTER — Inpatient Hospital Stay: Payer: Medicare Other

## 2017-09-17 ENCOUNTER — Other Ambulatory Visit: Payer: Self-pay | Admitting: Medical Oncology

## 2017-09-17 ENCOUNTER — Inpatient Hospital Stay (HOSPITAL_BASED_OUTPATIENT_CLINIC_OR_DEPARTMENT_OTHER): Payer: Medicare Other | Admitting: Internal Medicine

## 2017-09-17 ENCOUNTER — Encounter: Payer: Self-pay | Admitting: Internal Medicine

## 2017-09-17 ENCOUNTER — Ambulatory Visit (HOSPITAL_COMMUNITY)
Admission: RE | Admit: 2017-09-17 | Discharge: 2017-09-17 | Disposition: A | Discharge status: Home / Self Care | Payer: Medicare Other | Source: Ambulatory Visit | Attending: Internal Medicine | Admitting: Internal Medicine

## 2017-09-17 ENCOUNTER — Encounter (HOSPITAL_COMMUNITY): Payer: Self-pay

## 2017-09-17 VITALS — BP 167/47 | HR 47 | Temp 98.4°F | Resp 18 | Ht 65.0 in | Wt 151.5 lb

## 2017-09-17 DIAGNOSIS — I517 Cardiomegaly: Secondary | ICD-10-CM | POA: Diagnosis not present

## 2017-09-17 DIAGNOSIS — R053 Chronic cough: Secondary | ICD-10-CM

## 2017-09-17 DIAGNOSIS — R531 Weakness: Secondary | ICD-10-CM

## 2017-09-17 DIAGNOSIS — R918 Other nonspecific abnormal finding of lung field: Secondary | ICD-10-CM | POA: Insufficient documentation

## 2017-09-17 DIAGNOSIS — R5383 Other fatigue: Secondary | ICD-10-CM | POA: Diagnosis not present

## 2017-09-17 DIAGNOSIS — R0601 Orthopnea: Secondary | ICD-10-CM

## 2017-09-17 DIAGNOSIS — J90 Pleural effusion, not elsewhere classified: Secondary | ICD-10-CM | POA: Diagnosis not present

## 2017-09-17 DIAGNOSIS — I700 Atherosclerosis of aorta: Secondary | ICD-10-CM | POA: Diagnosis not present

## 2017-09-17 DIAGNOSIS — D61818 Other pancytopenia: Secondary | ICD-10-CM

## 2017-09-17 DIAGNOSIS — R05 Cough: Secondary | ICD-10-CM | POA: Diagnosis not present

## 2017-09-17 DIAGNOSIS — I10 Essential (primary) hypertension: Secondary | ICD-10-CM

## 2017-09-17 DIAGNOSIS — I2510 Atherosclerotic heart disease of native coronary artery without angina pectoris: Secondary | ICD-10-CM | POA: Diagnosis not present

## 2017-09-17 DIAGNOSIS — K7460 Unspecified cirrhosis of liver: Secondary | ICD-10-CM | POA: Diagnosis not present

## 2017-09-17 DIAGNOSIS — N189 Chronic kidney disease, unspecified: Secondary | ICD-10-CM | POA: Diagnosis not present

## 2017-09-17 DIAGNOSIS — E119 Type 2 diabetes mellitus without complications: Secondary | ICD-10-CM

## 2017-09-17 DIAGNOSIS — D649 Anemia, unspecified: Secondary | ICD-10-CM | POA: Diagnosis not present

## 2017-09-17 DIAGNOSIS — I5032 Chronic diastolic (congestive) heart failure: Secondary | ICD-10-CM

## 2017-09-17 NOTE — Telephone Encounter (Signed)
Printed avs and calender for up coming appointment per 2/13 los. Lab addon was placed, andt patient wants to get CT which was scheduled for 4:00 time change to be done while already here. Due to last blood results I passed all information over to nurse Diane.

## 2017-09-17 NOTE — Progress Notes (Signed)
[facility] Telephone:([phone]) [phone]   Fax:(336) [phone]  OFFICE PROGRESS NOTE  Gildardo Pounds, NP [place] [address]  DIAGNOSIS:  1) pancytopenia of unclear etiology.  PRIOR THERAPY: None  CURRENT THERAPY: None  INTERVAL HISTORY: William [name] 67 y.o. male returns to the clinic today for follow-up visit accompanied by his son.  The patient is feeling fine today with no specific complaints except for the persistent cough and orthopnea.  He also continues to complain of increasing fatigue and weakness.  He recently underwent a bone marrow biopsy and aspirate.  He also had several studies performed for evaluation of his pancytopenia.  He was seen by nephrology and started on IV iron infusion as well as Procrit every 2 weeks.  He denied having any current chest pain or hemoptysis.  He denied having any weight loss or night sweats.  He has no nausea, vomiting, diarrhea or constipation.  He is here today for evaluation and discussion of his biopsy results and recommendation regarding his condition.  MEDICAL HISTORY: Past Medical History:  Diagnosis Date  . Diabetes mellitus without complication ([place])   . Hypertension     ALLERGIES:  has No Known Allergies.  MEDICATIONS:  Current Outpatient Medications  Medication Sig Dispense Refill  . amLODipine (NORVASC) 10 MG tablet Take 1 tablet (10 mg total) daily by mouth. 90 tablet 0  . furosemide (LASIX) 20 MG tablet Take 20 mg by mouth daily.    . insulin NPH-insulin regular (NOVOLIN 70/30) (70-30) 100 UNIT/ML injection Inject into the skin 2 (two) times daily with a meal. 30 units in am and 10 units at night     No current facility-administered medications for this visit.     SURGICAL HISTORY:  Past Surgical History:  Procedure Laterality Date  . NECK SURGERY      REVIEW OF SYSTEMS:  Constitutional: positive for fatigue Eyes: negative Ears, nose, mouth, throat, and face:  negative Respiratory: positive for cough and dyspnea on exertion Cardiovascular: positive for orthopnea Gastrointestinal: negative Genitourinary:negative Integument/breast: negative Hematologic/lymphatic: negative Musculoskeletal:negative Neurological: negative Behavioral/Psych: negative Endocrine: negative Allergic/Immunologic: negative   PHYSICAL EXAMINATION: General appearance: alert, cooperative, fatigued and no distress Head: Normocephalic, without obvious abnormality, atraumatic Neck: no adenopathy, no JVD, supple, symmetrical, trachea midline and thyroid not enlarged, symmetric, no tenderness/mass/nodules Lymph nodes: Cervical, supraclavicular, and axillary nodes normal. Resp: rales bilaterally Back: symmetric, no curvature. ROM normal. No CVA tenderness. Cardio: regular rate and rhythm, S1, S2 normal, no murmur, click, rub or gallop GI: soft, non-tender; bowel sounds normal; no masses,  no organomegaly Extremities: extremities normal, atraumatic, no cyanosis or edema Neurologic: Alert and oriented X 3, normal strength and tone. Normal symmetric reflexes. Normal coordination and gait  ECOG PERFORMANCE STATUS: 1 - Symptomatic but completely ambulatory  Blood pressure (!) 167/47, pulse (!) 47, temperature 98.4 F (36.9 C), temperature source Oral, resp. rate 18, height 5' 5"  (1.651 m), weight 151 lb 8 oz (68.7 kg), SpO2 100 %.  LABORATORY DATA: Lab Results  Component Value Date   WBC 3.4 (L) 09/15/2017   HGB 7.7 (L) 09/12/2017   HCT 25.4 (L) 09/15/2017   MCV 74.9 (L) 09/15/2017   PLT 63 (L) 09/15/2017      Chemistry      Component Value Date/Time   NA 137 09/15/2017 0856   NA 137 06/20/2017 1613   K 5.2 (H) 09/15/2017 0856   CL 110 (H) 09/15/2017 0856   CO2 19 (  L) 09/15/2017 0856   BUN 70 (H) 09/15/2017 0856   BUN 62 (H) 06/20/2017 1613   CREATININE 3.53 (HH) 09/15/2017 0856   CREATININE 1.23 08/31/2015 0859      Component Value Date/Time   CALCIUM 8.8  09/15/2017 0856   ALKPHOS 101 09/15/2017 0856   AST 22 09/15/2017 0856   ALT 10 09/15/2017 0856   BILITOT 0.6 09/15/2017 0856       RADIOGRAPHIC STUDIES: Dg Chest 2 View  Result Date: 09/15/2017 CLINICAL DATA:  Chest pain 1 month. EXAM: CHEST  2 VIEW COMPARISON:  06/10/2013 FINDINGS: Lungs are adequately inflated demonstrate moderate bilateral perihilar opacification likely interstitial edema, although infection is possible. No evidence of effusion. Cardiomediastinal silhouette and remainder of the exam is unchanged. IMPRESSION: Moderate bilateral perihilar opacification likely due to interstitial edema, although infection is possible. Recommend follow-up to resolution. Electronically Signed   By: Marin Olp M.D.   On: 09/15/2017 09:34   US Abdomen Complete  Result Date: 09/04/2017 CLINICAL DATA:  Microcytic anemia, diabetes mellitus, hypertension EXAM: ABDOMEN ULTRASOUND COMPLETE COMPARISON:  None. FINDINGS: Gallbladder: Normally distended without stones or wall thickening. No pericholecystic fluid or sonographic Murphy sign. Common bile duct: Diameter: 8 mm diameter, borderline slightly prominent for age Liver: Heterogeneous echogenicity. Smooth hepatic contours. No gross evidence of mass or nodularity. Portal vein appears patent with normal direction of blood flow towards the liver on color Doppler imaging. IVC: Visualized portion normal appearance, portions obscured by bowel gas Pancreas: Visualized portion unremarkable. Spleen: Normal appearance, 11.9 cm length Right Kidney: Length: 8.5 cm. Normal cortical thickness. Increased cortical echogenicity. No mass or hydronephrosis. Left Kidney: Length: 8.9 cm. Normal cortical thickness. Increased cortical echogenicity. No mass or hydronephrosis. Abdominal aorta: Normal caliber Other findings: No free fluid.  Urinary bladder unremarkable. IMPRESSION: Question mildly prominent CBD diameter of 8 mm for age, recommend correlation with LFTs. Remainder  of exam unremarkable. Electronically Signed   By: Lavonia Dana M.D.   On: 09/04/2017 16:12   US Pelvis Limited (transabdominal Only)  Result Date: 09/04/2017 CLINICAL DATA:  Microcytic anemia, diabetes mellitus, hypertension, chronic kidney disease EXAM: LIMITED ULTRASOUND OF PELVIS TECHNIQUE: Limited transabdominal ultrasound examination of the pelvis was performed to assess the urinary bladder. COMPARISON:  None FINDINGS: Urinary bladder is incompletely distended but otherwise normal in appearance. No bladder wall thickening identified. No evidence of mass or internal debris/stones. IMPRESSION: Unremarkable sonographic appearance of the urinary bladder. Electronically Signed   By: Lavonia Dana M.D.   On: 09/04/2017 16:29    ASSESSMENT AND PLAN: This is a very pleasant 67 years old [place] male with pancytopenia of unclear etiology most likely reactive in nature based on the most recent bone marrow biopsy and aspirate. I had a lengthy discussion with the patient and his son today about his condition.  I will order a few more studies to rule out other underlying etiology of his pancytopenia including hepatitis panel, ANA, as well as HIV. The patient continues to have orthopnea and on physical examination he has bilateral crackles highly suspicious for congestive heart failure. I will order CT scan of the chest to rule out any underlying etiology in his lung.  I would also refer the patient to cardiology for evaluation and management of his suspicious congestive heart failure. For the chronic renal insufficiency, he is followed by nephrology. For the persistent anemia, he is currently undergoing treatment with IV iron and Procrit by nephrology. I will see the patient back for follow-up visit in 2  weeks for reevaluation and further recommendation regarding his condition based on the pending lab work and imaging studies. He was advised to call immediately if he has any concerning symptoms in the  interval. The patient voices understanding of current disease status and treatment options and is in agreement with the current care plan.  All questions were answered. The patient knows to call the clinic with any problems, questions or concerns. We can certainly see the patient much sooner if necessary.  I spent 15 minutes counseling the patient face to face. The total time spent in the appointment was 25 minutes.  Disclaimer: This note was dictated with voice recognition software. Similar sounding words can inadvertently be transcribed and may not be corrected upon review.

## 2017-09-18 ENCOUNTER — Other Ambulatory Visit: Payer: Self-pay | Admitting: Nephrology

## 2017-09-18 ENCOUNTER — Ambulatory Visit
Admission: RE | Admit: 2017-09-18 | Discharge: 2017-09-18 | Disposition: A | Discharge status: Home / Self Care | Payer: Medicaid Other | Source: Ambulatory Visit | Attending: Nephrology | Admitting: Nephrology

## 2017-09-18 DIAGNOSIS — R042 Hemoptysis: Secondary | ICD-10-CM

## 2017-09-18 DIAGNOSIS — J90 Pleural effusion, not elsewhere classified: Secondary | ICD-10-CM | POA: Diagnosis not present

## 2017-09-18 DIAGNOSIS — N184 Chronic kidney disease, stage 4 (severe): Secondary | ICD-10-CM

## 2017-09-18 LAB — HEPATITIS PANEL, ACUTE
HCV Ab: 0.1 {s_co_ratio} (ref 0.0–0.9)
Hep A IgM: NEGATIVE
Hep B C IgM: NEGATIVE
Hepatitis B Surface Ag: NEGATIVE

## 2017-09-18 LAB — HIV ANTIBODY (ROUTINE TESTING W REFLEX): HIV Screen 4th Generation wRfx: NONREACTIVE

## 2017-09-19 ENCOUNTER — Ambulatory Visit (HOSPITAL_COMMUNITY)
Admission: RE | Admit: 2017-09-19 | Discharge: 2017-09-19 | Disposition: A | Discharge status: Home / Self Care | Payer: Medicare Other | Source: Ambulatory Visit | Attending: Nephrology | Admitting: Nephrology

## 2017-09-19 DIAGNOSIS — D631 Anemia in chronic kidney disease: Secondary | ICD-10-CM | POA: Insufficient documentation

## 2017-09-19 DIAGNOSIS — N184 Chronic kidney disease, stage 4 (severe): Secondary | ICD-10-CM | POA: Insufficient documentation

## 2017-09-19 MED ORDER — FERUMOXYTOL INJECTION 510 MG/17 ML
510.0000 mg | INTRAVENOUS | Status: AC
  Administered 2017-09-19: 510 mg via INTRAVENOUS
  Filled 2017-09-19: qty 17

## 2017-09-22 LAB — ANTINUCLEAR ANTIBODIES, IFA: ANTINUCLEAR ANTIBODIES, IFA: NEGATIVE

## 2017-09-24 ENCOUNTER — Encounter (HOSPITAL_COMMUNITY): Payer: Self-pay

## 2017-09-25 ENCOUNTER — Other Ambulatory Visit (INDEPENDENT_AMBULATORY_CARE_PROVIDER_SITE_OTHER): Payer: Medicaid Other

## 2017-09-25 ENCOUNTER — Encounter: Payer: Self-pay | Admitting: Internal Medicine

## 2017-09-25 ENCOUNTER — Other Ambulatory Visit: Payer: Medicaid Other

## 2017-09-25 ENCOUNTER — Ambulatory Visit: Payer: Medicaid Other | Admitting: Internal Medicine

## 2017-09-25 ENCOUNTER — Telehealth: Payer: Self-pay | Admitting: Medical Oncology

## 2017-09-25 ENCOUNTER — Telehealth: Payer: Self-pay | Admitting: Internal Medicine

## 2017-09-25 ENCOUNTER — Ambulatory Visit (INDEPENDENT_AMBULATORY_CARE_PROVIDER_SITE_OTHER): Payer: Medicare Other | Admitting: Internal Medicine

## 2017-09-25 VITALS — BP 156/90 | HR 98 | Ht 61.5 in | Wt 150.0 lb

## 2017-09-25 DIAGNOSIS — R05 Cough: Secondary | ICD-10-CM | POA: Diagnosis not present

## 2017-09-25 DIAGNOSIS — R053 Chronic cough: Secondary | ICD-10-CM

## 2017-09-25 DIAGNOSIS — R0609 Other forms of dyspnea: Secondary | ICD-10-CM

## 2017-09-25 DIAGNOSIS — R059 Cough, unspecified: Secondary | ICD-10-CM

## 2017-09-25 LAB — SEDIMENTATION RATE: Sed Rate: 42 mm/hr — ABNORMAL HIGH (ref 0–20)

## 2017-09-25 LAB — BRAIN NATRIURETIC PEPTIDE: Pro B Natriuretic peptide (BNP): 380 pg/mL — ABNORMAL HIGH (ref 0.0–100.0)

## 2017-09-25 LAB — TSH: TSH: 3.27 u[IU]/mL (ref 0.35–4.50)

## 2017-09-25 NOTE — Patient Instructions (Addendum)
For drainage / throat tickle try take CHLORPHENIRAMINE  4 mg - take one every 4 hours as needed - available over the counter- may cause drowsiness so start with just a bedtime dose or two and see how you tolerate it before trying in daytime    Add pepcid ac 20 mg at bedtime (over the counter)   GERD (REFLUX)  is an extremely common cause of respiratory symptoms just like yours , many times with no obvious heartburn at all.    It can be treated with medication, but also with lifestyle changes including elevation of the head of your bed (ideally with 6 inch  bed blocks),  Smoking cessation, avoidance of late meals, excessive alcohol, and avoid fatty foods, chocolate, peppermint, colas, red wine, and acidic juices such as orange juice.  NO MINT OR MENTHOL PRODUCTS SO NO COUGH DROPS  USE SUGARLESS CANDY INSTEAD (Jolley ranchers or Stover's or Life Savers) or even ice chips will also do - the key is to swallow to prevent all throat clearing. NO OIL BASED VITAMINS - use powdered substitutes.     Please see patient coordinator before you leave today  to schedule sinus CT   Please remember to go to the lab  department downstairs in the basement  for your tests - we will call you with the results when they are available   Please schedule a follow up office visit in 2 weeks, sooner if needed  with all medications /inhalers/ solutions in hand so we can verify exactly what you are taking. This includes all medications from all doctors and over the counters        ac 20     (  )  GERD (REFLUX)                .                  (    6 )                         . NO     MENTHOL            (Jolley ranchers  Stover's  Life Savers)       -        .      -   .                CT                -                             /   /           .           'iidafatan bibsidi ac 20 malagh fi waqt alnuwm (akathar min aleadad) GERD (REFLUX) hu sbb shayie lilghayat li'aerad tanfasiat mithlih mithlih , edt marrat bidun harqat wadihat ealaa al'iitlaq

## 2017-09-25 NOTE — Telephone Encounter (Signed)
Returned call to patient.  He was requesting to know Date/Time of next appt

## 2017-09-25 NOTE — Telephone Encounter (Signed)
appt with [facility] 10/14/17.

## 2017-09-25 NOTE — Progress Notes (Signed)
Subjective:     Patient ID: William [name], male   DOB: October 04, 1950,     MRN: [id number]  HPI  67 yo [place] male/ speaks only arabic/  never smoker  Onset of  Daily dry cough while in [place] x 2011 worse at bedtime helps to sit up and then started with streaky hemotpysis x 07/2017 referred to pulmonary clinic 09/25/2017 by Dr   Alessandra Bevels    09/25/2017 1st [facility] office visit/ Chellsea Beckers   Chief Complaint  Patient presents with  . New Consult    hemoptysis for 2 months  cough much worse immediately on lying down assoc with nose bleeding and itching/ sneezing Doe x  MMRC1 = can walk nl pace, flat grade, can't hurry or go uphills or steps s sob    No obvious day to day or daytime variability or assoc excess/ purulent sputum or mucus plugs or  cp or chest tightness, subjective wheeze or overt sinus or hb symptoms. No unusual exposure hx or h/o childhood pna/ asthma or knowledge of premature birth.    Also denies any obvious fluctuation of symptoms with weather or environmental changes or other aggravating or alleviating factors except as outlined above   Current Allergies, Complete Past Medical History, Past Surgical History, Family History, and Social History were reviewed in [employer] record.  ROS  The following are not active complaints unless bolded Hoarseness, sore throat, dysphagia, dental problems, itching, sneezing,  nasal congestion or discharge of excess mucus or purulent secretions, ear ache,   fever, chills, sweats, unintended wt loss or wt gain, classically pleuritic or exertional cp,  orthopnea pnd or leg swelling, presyncope, palpitations, abdominal pain, anorexia, nausea, vomiting, diarrhea  or change in bowel habits or change in bladder habits, change in stools or change in urine, dysuria, hematuria,  rash, arthralgias, visual complaints, headache, numbness, weakness or ataxia or problems with walking or coordination,  change in mood/affect or memory.       Current Meds  Medication Sig  . insulin NPH-insulin regular (NOVOLIN 70/30) (70-30) 100 UNIT/ML injection Inject into the skin 2 (two) times daily with a meal. 30 units in am and 10 units at night        Review of Systems     Objective:   Physical Exam    amb arabic male nad at rest/ never coughed up any blood or even coughed during ov    Wt Readings from Last 3 Encounters:  09/25/17 150 lb (68 kg)  09/19/17 151 lb (68.5 kg)  09/17/17 151 lb 8 oz (68.7 kg)     Vital signs reviewed - Note on arrival 02 sats  95% on RA - bp 156/90       HEENT: nl dentition, turbinates bilaterally, and oropharynx. Nl external ear canals without cough reflex   NECK :  without JVD/Nodes/TM/ nl carotid upstrokes bilaterally   LUNGS: no acc muscle use,  Nl contour chest with insp crackles R > L base    CV:  RRR  no  ? Summation gallop/  I-II/ IV sem - no increase in P2, and no edema   ABD:  soft and nontender with nl inspiratory excursion in the supine position. No bruits or organomegaly appreciated, bowel sounds nl  MS:  Nl gait/ ext warm without deformities, calf tenderness, cyanosis or clubbing No obvious joint restrictions   SKIN: warm and dry without lesions    NEURO:  alert, approp, nl sensorium with  no motor or cerebellar  deficits apparent.     Labs ordered/ reviewed:      Chemistry      Component Value Date/Time   NA 137 09/15/2017 0856   NA 137 06/20/2017 1613   K 5.2 (H) 09/15/2017 0856   CL 110 (H) 09/15/2017 0856   CO2 19 (L) 09/15/2017 0856   BUN 70 (H) 09/15/2017 0856   BUN 62 (H) 06/20/2017 1613   CREATININE 3.53 (HH) 09/15/2017 0856   CREATININE 1.23 08/31/2015 0859      Component Value Date/Time   CALCIUM 8.8 09/15/2017 0856   ALKPHOS 101 09/15/2017 0856   AST 22 09/15/2017 0856   ALT 10 09/15/2017 0856   BILITOT 0.6 09/15/2017 0856        Lab Results  Component Value Date   WBC 3.4 (L) 09/15/2017   HGB 7.7 (L) 09/12/2017   HCT 25.4 (L)  09/15/2017   MCV 74.9 (L) 09/15/2017   PLT 63 (L) 09/15/2017        EOS                       0.1                                                                   09/25/2017   No results found for: DDIMER    Lab Results  Component Value Date   TSH 3.27 09/25/2017     Lab Results  Component Value Date   PROBNP 380.0 (H) 09/25/2017       Lab Results  Component Value Date   ESRSEDRATE 42 (H) 09/25/2017       Labs ordered 09/25/2017   Allergy profile     I personally reviewed images and agree with radiology impression as follows:   Chest CT 09/17/17 1. Mild motion degradation. 2. Relatively diffuse ground-glass and airspace disease with concurrent cardiomegaly and bilateral pleural effusions. Favor congestive heart failure. Concurrent infection (including atypicals), pulmonary hemorrhage or aspiration could look similar. 3. Coronary artery atherosclerosis. Aortic Atherosclerosis (ICD10-I70.0). 4. Esophageal air fluid level suggests dysmotility or gastroesophageal reflux. 5. Cirrhosis.  Suspect portal venous hypertension. 6. Thoracic adenopathy is most likely reactive    Assessment:

## 2017-09-26 ENCOUNTER — Ambulatory Visit (HOSPITAL_COMMUNITY)
Admission: RE | Admit: 2017-09-26 | Discharge: 2017-09-26 | Disposition: A | Discharge status: Home / Self Care | Payer: Medicare Other | Source: Ambulatory Visit | Attending: Nephrology | Admitting: Nephrology

## 2017-09-26 ENCOUNTER — Encounter: Payer: Self-pay | Admitting: Internal Medicine

## 2017-09-26 DIAGNOSIS — N189 Chronic kidney disease, unspecified: Secondary | ICD-10-CM | POA: Diagnosis not present

## 2017-09-26 DIAGNOSIS — D631 Anemia in chronic kidney disease: Secondary | ICD-10-CM | POA: Insufficient documentation

## 2017-09-26 LAB — RESPIRATORY ALLERGY PROFILE REGION II ~~LOC~~
Allergen, Cedar tree, t12: <0.1 kU/L
Allergen, D pternoyssinus,d7: <0.1 kU/L
Allergen, Mouse Urine Protein, e78: <0.1 kU/L
Allergen, Oak,t7: <0.1 kU/L
Allergen, P. notatum, m1: <0.1 kU/L
Box Elder IgE: <0.1 kU/L
CLASS: 0
CLASS: 0
CLASS: 0
CLASS: 0
CLASS: 0
CLASS: 0
CLASS: 0
CLASS: 0
Cat Dander: <0.1 kU/L
Class: 0
Class: 0
Class: 0
Class: 0
Class: 0
Class: 0
Class: 0
Class: 0
Class: 0
Class: 0
Class: 0
Class: 0
Class: 0
Class: 0
Class: 0
Class: 0
Cockroach: <0.1 kU/L
D. farinae: 0.18 kU/L — ABNORMAL HIGH
Elm IgE: <0.1 kU/L
IgE (Immunoglobulin E), Serum: 28 kU/L (ref <=114)
Johnson Grass: <0.1 kU/L
Timothy Grass: <0.1 kU/L

## 2017-09-26 LAB — IRON AND TIBC
IRON: 55 ug/dL (ref 45–182)
Saturation Ratios: 19 % (ref 17.9–39.5)
TIBC: 288 ug/dL (ref 250–450)
UIBC: 233 ug/dL

## 2017-09-26 LAB — POCT HEMOGLOBIN-HEMACUE: Hemoglobin: 8.7 g/dL — ABNORMAL LOW (ref 13.0–17.0)

## 2017-09-26 LAB — CHROMOSOME ANALYSIS, BONE MARROW

## 2017-09-26 LAB — INTERPRETATION:

## 2017-09-26 LAB — FERRITIN: Ferritin: 557 ng/mL — ABNORMAL HIGH (ref 24–336)

## 2017-09-26 MED ORDER — EPOETIN ALFA 10000 UNIT/ML IJ SOLN
INTRAMUSCULAR | Status: AC
  Administered 2017-09-26: 10000 [IU] via SUBCUTANEOUS
  Filled 2017-09-26: qty 1

## 2017-09-26 MED ORDER — EPOETIN ALFA 20000 UNIT/ML IJ SOLN: 10000.0000 [IU] | INTRAMUSCULAR | Status: DC

## 2017-09-26 NOTE — Assessment & Plan Note (Addendum)
Echo ordered 09/25/2017   Strongly suspect this is related to vol overload and at least diastolic dysfunction plus effects of anemia and low hc03 (from CRI) and not primarily a lung problem at this point though is at risk of alv hemorrhage from low plt's and hepatopulmonary syndrome / PAH from liver dz.  Would try to keep him as dry as tolerated and consider oracit rx but defer this to Dr Posey Pronto.

## 2017-09-26 NOTE — Assessment & Plan Note (Addendum)
Onset 2011/ blood streaking since 07/2017 - Sinus CT  The most common causes of chronic cough in immunocompetent adults include the following: upper airway cough syndrome (UACS), previously referred to as postnasal drip syndrome (PNDS), which is caused by variety of rhinosinus conditions; (2) asthma; (3) GERD; (4) chronic bronchitis from cigarette smoking or other inhaled environmental irritants; (5) nonasthmatic eosinophilic bronchitis; and (6) bronchiectasis.   These conditions, singly or in combination, have accounted for up to 94% of the causes of chronic cough in prospective studies.   Other conditions have constituted no >6% of the causes in prospective studies These have included bronchogenic carcinoma, chronic interstitial pneumonia, sarcoidosis, left ventricular failure, ACEI-induced cough, and aspiration from a condition associated with pharyngeal dysfunction.    Chronic cough is often simultaneously caused by more than one condition. A single cause has been found from 38 to 82% of the time, multiple causes from 18 to 62%. Multiply caused cough has been the result of three diseases up to 42% of the time.   I doubt he has has had heart failure all these years but the blood now is likely related to diastolic dysfunction and low plt but also could be sinus dz since also has epistaxis  rec sinus ct/ add noct acid suppression and gerd diet and f/u  In 2 weeks with all meds in hand using a trust but verify approach to confirm accurate Medication  Reconciliation The principal here is that until we are certain that the  patients are doing what we've asked, it makes no sense to ask them to do more.   Total time devoted to counseling  > 50 % of initial 60 min office visit:  review case with pt/son discussion of options/alternatives/ personally creating written customized instructions(translated into arabic by Designer, television/film set)   in presence of pt  then going over those specific  Instructions directly  with the pt including how to use all of the meds but in particular covering each new medication in detail and the difference between the maintenance= "automatic" meds and the prns using an action plan format for the latter (If this problem/symptom => do that organization reading Left to right).  Please see AVS from this visit for a full list of these instructions which I personally wrote for this pt and  are unique to this visit.

## 2017-09-29 ENCOUNTER — Ambulatory Visit (INDEPENDENT_AMBULATORY_CARE_PROVIDER_SITE_OTHER)
Admission: RE | Admit: 2017-09-29 | Discharge: 2017-09-29 | Disposition: A | Discharge status: Home / Self Care | Payer: Medicare Other | Source: Ambulatory Visit | Attending: Internal Medicine | Admitting: Internal Medicine

## 2017-09-29 DIAGNOSIS — R059 Cough, unspecified: Secondary | ICD-10-CM

## 2017-09-29 DIAGNOSIS — R05 Cough: Secondary | ICD-10-CM

## 2017-09-29 DIAGNOSIS — R053 Chronic cough: Secondary | ICD-10-CM

## 2017-10-01 ENCOUNTER — Inpatient Hospital Stay: Payer: Medicare Other

## 2017-10-01 ENCOUNTER — Other Ambulatory Visit: Payer: Self-pay | Admitting: Internal Medicine

## 2017-10-01 DIAGNOSIS — R531 Weakness: Secondary | ICD-10-CM | POA: Diagnosis not present

## 2017-10-01 DIAGNOSIS — D61818 Other pancytopenia: Secondary | ICD-10-CM

## 2017-10-01 DIAGNOSIS — N189 Chronic kidney disease, unspecified: Secondary | ICD-10-CM | POA: Diagnosis not present

## 2017-10-01 DIAGNOSIS — R05 Cough: Secondary | ICD-10-CM | POA: Diagnosis not present

## 2017-10-01 DIAGNOSIS — D649 Anemia, unspecified: Secondary | ICD-10-CM | POA: Diagnosis not present

## 2017-10-01 DIAGNOSIS — R0601 Orthopnea: Secondary | ICD-10-CM | POA: Diagnosis not present

## 2017-10-01 LAB — CBC WITH DIFFERENTIAL (CANCER CENTER ONLY)
Basophils Absolute: 0 10*3/uL (ref 0.0–0.1)
Basophils Relative: 1 %
EOS ABS: 0.1 10*3/uL (ref 0.0–0.5)
EOS PCT: 4 %
HCT: 27.6 % — ABNORMAL LOW (ref 38.4–49.9)
Hemoglobin: 8.8 g/dL — ABNORMAL LOW (ref 13.0–17.1)
LYMPHS ABS: 0.7 10*3/uL — AB (ref 0.9–3.3)
Lymphocytes Relative: 23 %
MCH: 24.2 pg — AB (ref 27.2–33.4)
MCHC: 31.9 g/dL — AB (ref 32.0–36.0)
MCV: 76 fL — ABNORMAL LOW (ref 79.3–98.0)
Monocytes Absolute: 0.5 10*3/uL (ref 0.1–0.9)
Monocytes Relative: 14 %
Neutro Abs: 1.8 10*3/uL (ref 1.5–6.5)
Neutrophils Relative %: 58 %
PLATELETS: 68 10*3/uL — AB (ref 140–400)
RBC: 3.63 MIL/uL — AB (ref 4.20–5.82)
RDW: 16.5 % — ABNORMAL HIGH (ref 11.0–14.6)
WBC: 3.1 10*3/uL — AB (ref 4.0–10.3)

## 2017-10-07 DIAGNOSIS — D696 Thrombocytopenia, unspecified: Secondary | ICD-10-CM | POA: Diagnosis not present

## 2017-10-07 DIAGNOSIS — D509 Iron deficiency anemia, unspecified: Secondary | ICD-10-CM | POA: Diagnosis not present

## 2017-10-07 DIAGNOSIS — D61818 Other pancytopenia: Secondary | ICD-10-CM | POA: Diagnosis not present

## 2017-10-07 DIAGNOSIS — R9389 Abnormal findings on diagnostic imaging of other specified body structures: Secondary | ICD-10-CM | POA: Diagnosis not present

## 2017-10-07 DIAGNOSIS — R042 Hemoptysis: Secondary | ICD-10-CM | POA: Diagnosis not present

## 2017-10-07 DIAGNOSIS — N189 Chronic kidney disease, unspecified: Secondary | ICD-10-CM | POA: Diagnosis not present

## 2017-10-07 DIAGNOSIS — D638 Anemia in other chronic diseases classified elsewhere: Secondary | ICD-10-CM | POA: Diagnosis not present

## 2017-10-10 ENCOUNTER — Ambulatory Visit (HOSPITAL_COMMUNITY)
Admission: RE | Admit: 2017-10-10 | Discharge: 2017-10-10 | Disposition: A | Discharge status: Home / Self Care | Payer: Medicare Other | Source: Ambulatory Visit | Attending: Nephrology | Admitting: Nephrology

## 2017-10-10 DIAGNOSIS — N189 Chronic kidney disease, unspecified: Secondary | ICD-10-CM | POA: Insufficient documentation

## 2017-10-10 DIAGNOSIS — D631 Anemia in chronic kidney disease: Secondary | ICD-10-CM | POA: Diagnosis not present

## 2017-10-10 LAB — POCT HEMOGLOBIN-HEMACUE: Hemoglobin: 9.8 g/dL — ABNORMAL LOW (ref 13.0–17.0)

## 2017-10-10 MED ORDER — EPOETIN ALFA 10000 UNIT/ML IJ SOLN
INTRAMUSCULAR | Status: AC
  Administered 2017-10-10: 10000 [IU]
  Filled 2017-10-10: qty 1

## 2017-10-10 MED ORDER — EPOETIN ALFA 20000 UNIT/ML IJ SOLN: 10000.0000 [IU] | INTRAMUSCULAR | Status: DC

## 2017-10-12 NOTE — Progress Notes (Signed)
Cardiology Office Note   Date:  10/14/2017   ID:  William [name], DOB [birth year], MRN [id number]  PCP:  William Pounds, NP  Cardiologist:   William Rouge, MD   No chief complaint on file.     History of Present Illness: William [name] is a 67 y.o. male who presents for consultation regarding chronic diastolic CHF. Referred by NP William [name]. CRF;s HTN , HLD and DM.  From [place] Only speaks Arabic Non smoker has seen William [name] for hemoptysis  CT showed ? Atypical infection ? CHF or aspiration Noted coronary calcification and cirrhosis with esophageal dysmotility Also has CRF with CR 3.5 Labs reviewed from 09/25/17 BNP only 380 ESR elevated 42 Hct 25.4 PLT 63  Hepatitis panel and HIV negative Started on iv iron and Procrit. Has had BM biopsy for pancytopenia  CXR 09/18/17 showed CHF small effusions Has not had TTE echo yet.  He has never had a heart problem No PND, Orthopnea, chest pain palpitations or syncope Chronic cough Persists. Still making normal urine Wants to return to [place] Son with him today Interpreter very helpful     Past Medical History:  Diagnosis Date  . Diabetes mellitus without complication ([place])   . Hypertension     Past Surgical History:  Procedure Laterality Date  . NECK SURGERY       Current Outpatient Medications  Medication Sig Dispense Refill  . famotidine (PEPCID) 20 MG tablet Take 20 mg by mouth 2 (two) times daily.    . ferrous gluconate (IRON 27) 240 (27 FE) MG tablet Take 1 tablet by mouth daily.    . furosemide (LASIX) 20 MG tablet Take 20 mg by mouth daily.    . insulin NPH-insulin regular (NOVOLIN 70/30) (70-30) 100 UNIT/ML injection Inject into the skin 2 (two) times daily with a meal. 30 units in am and 10 units at night    . Multiple Vitamins-Minerals (MULTIVITAMIN WITH MINERALS) tablet Take 1 tablet by mouth daily.    . pantoprazole (PROTONIX) 40 MG tablet Take 1 tablet (40 mg total) by mouth daily. Take 30-60 min before first meal of the  day 30 tablet 2  . Polyethyl Glycol-Propyl Glycol (SYSTANE) 0.4-0.3 % SOLN Apply to eye as directed.    Marland Kitchen amLODipine (NORVASC) 10 MG tablet Take 1 tablet (10 mg total) daily by mouth. 90 tablet 0   No current facility-administered medications for this visit.     Allergies:   Patient has no known allergies.    Social History:  The patient  reports that  has never smoked. he has never used smokeless tobacco. He reports that he does not drink alcohol.   Family History:  The patient's family history is not on file.    ROS:  Please see the history of present illness.   Otherwise, review of systems are positive for none.   All other systems are reviewed and negative.    PHYSICAL EXAM: VS:  BP (!) 156/58   Pulse 93   Ht 5' 4"  (1.626 m)   Wt 145 lb (65.8 kg)   BMI 24.89 kg/m  , BMI Body mass index is 24.89 kg/m. Affect appropriate Chronically ill William [name] male  HEENT: normal Neck supple with no adenopathy JVP normal no bruits no thyromegaly Lungs clear with no wheezing and good diaphragmatic motion Heart:  S1/S2 no murmur, no rub, gallop or click PMI normal Abdomen: benighn, BS positve, no tenderness, no AAA no bruit.  No HSM or HJR Distal  pulses intact with no bruits No edema Neuro non-focal Skin warm and dry No muscular weakness    EKG:  SR rate 93 LAE nonspecific ST changes    Recent Labs: 09/15/2017: ALT 10; BUN 70; Creatinine 3.53; Potassium 5.2; Sodium 137 09/25/2017: Pro B Natriuretic peptide (BNP) 380.0; TSH 3.27 10/01/2017: Platelet Count 68 10/10/2017: Hemoglobin 9.8    Lipid Panel    Component Value Date/Time   CHOL 213 (H) 06/20/2017 1613   TRIG 216 (H) 06/20/2017 1613   HDL 34 (L) 06/20/2017 1613   CHOLHDL 6.3 (H) 06/20/2017 1613   CHOLHDL 4.2 08/31/2015 0859   VLDL 14 08/31/2015 0859   LDLCALC 136 (H) 06/20/2017 1613      Wt Readings from Last 3 Encounters:  10/14/17 145 lb (65.8 kg)  10/13/17 142 lb 12.8 oz (64.8 kg)  09/25/17 150 lb (68 kg)        Other studies Reviewed: Additional studies/ records that were reviewed today include: Notes from DR [facility] pulmonary Dr Posey Pronto Renal Dr [facility] Hematology labs CXR and CT .    ASSESSMENT AND PLAN:  1.  CHF:  Likely from CRF and fluid retention Lasix dose seems low given degree of renal failure will increase and have Dr Posey Pronto from nephrology adjust 2. Cirrhosis: etiology not clear hepatitis panel negative f/u primary  3. Pancytopenia BM not revealing f/u Hematology avoid anti platelet RX 4. CRF: major issue regarding volume overload and "diastolic " dysfunction f/u Patel on Procrit and lasix   Current medicines are reviewed at length with the patient today.  The patient does not have concerns regarding medicines.  The following changes have been made:  None   Labs/ tests ordered today include: Echo   Orders Placed This Encounter  Procedures  . EKG 12-Lead  . ECHOCARDIOGRAM COMPLETE     Disposition:   FU with cardiology PRN if EF normal      Signed, William Rouge, MD  10/14/2017 8:57 AM    [facility] [address], [place], [place]  [address] Phone: [phone]; Fax: [phone]

## 2017-10-13 ENCOUNTER — Encounter: Payer: Self-pay | Admitting: Internal Medicine

## 2017-10-13 ENCOUNTER — Ambulatory Visit: Payer: Medicare Other | Admitting: Internal Medicine

## 2017-10-13 VITALS — BP 146/70 | HR 94 | Ht 61.5 in | Wt 142.8 lb

## 2017-10-13 DIAGNOSIS — R0609 Other forms of dyspnea: Secondary | ICD-10-CM | POA: Diagnosis not present

## 2017-10-13 DIAGNOSIS — R053 Chronic cough: Secondary | ICD-10-CM

## 2017-10-13 DIAGNOSIS — R05 Cough: Secondary | ICD-10-CM | POA: Diagnosis not present

## 2017-10-13 MED ORDER — PANTOPRAZOLE SODIUM 40 MG PO TBEC: 40.0000 mg | DELAYED_RELEASE_TABLET | Freq: Every day | ORAL | 2 refills | Status: DC

## 2017-10-13 NOTE — Progress Notes (Signed)
Subjective:     Patient ID: William [name], male   DOB: 07/29/1951,     MRN: [id number]    Brief patient profile:  67 yo [place] male/ speaks only arabic/  never smoker  Onset of  Daily dry cough while in [place] x 2011 worse at bedtime helps to sit up and then started with streaky hemotpysis x 07/2017 referred to pulmonary clinic 09/25/2017 by Dr   Alessandra Bevels     History of Present Illness  09/25/2017 1st [facility] office visit/ William [name]   Chief Complaint  Patient presents with  . New Consult    hemoptysis for 2 months  cough much worse immediately on lying down assoc with nose bleeding and itching/ sneezing Doe x  MMRC1 = can walk nl pace, flat grade, can't hurry or go uphills or steps s sob   rec For drainage / throat tickle try take CHLORPHENIRAMINE  4 mg - take one every 4 hours as needed - available over the counter- may cause drowsiness so start with just a bedtime dose or two and see how you tolerate it before trying in daytime   Add pepcid ac 20 mg at bedtime (over the counter)  GERD  Please see patient coordinator before you leave today  to schedule sinus CT > rhinitis changes only      10/13/2017  f/u ov/William [name] re: chronic cough  Chief Complaint  Patient presents with  . Follow-up    Cough has improved some. He states sometimes cough is prod with minimal bloody sputum.    Dyspnea:  Not limited by breathing from desired activities   Cough: worse during the day  Sleep: ok flat  SABA use: none    No obvious day to day or daytime variability or assoc excess/ purulent sputum or mucus plugs or hemoptysis or cp or chest tightness, subjective wheeze or overt sinus or hb symptoms. No unusual exposure hx or h/o childhood pna/ asthma or knowledge of premature birth.  Sleeping ok flat without nocturnal  or early am exacerbation  of respiratory  c/o's or need for noct saba. Also denies any obvious fluctuation of symptoms with weather or environmental changes or other aggravating or  alleviating factors except as outlined above   Current Allergies, Complete Past Medical History, Past Surgical History, Family History, and Social History were reviewed in [employer] record.  ROS  The following are not active complaints unless bolded Hoarseness, sore throat, dysphagia, dental problems, itching, sneezing,  nasal congestion or discharge of excess mucus or purulent secretions, ear ache,   fever, chills, sweats, unintended wt loss or wt gain, classically pleuritic or exertional cp,  orthopnea pnd or leg swelling, presyncope, palpitations, abdominal pain, anorexia, nausea, vomiting, diarrhea  or change in bowel habits or change in bladder habits, change in stools or change in urine, dysuria, hematuria,  rash, arthralgias, visual complaints, headache, numbness, weakness or ataxia or problems with walking or coordination,  change in mood/affect or memory.        Current Meds  Medication Sig  . famotidine (PEPCID) 20 MG tablet Take 20 mg by mouth 2 (two) times daily.  . ferrous gluconate (IRON 27) 240 (27 FE) MG tablet Take 1 tablet by mouth daily.  . furosemide (LASIX) 20 MG tablet Take 20 mg by mouth daily.  . insulin NPH-insulin regular (NOVOLIN 70/30) (70-30) 100 UNIT/ML injection Inject into the skin 2 (two) times daily with a meal. 30 units in am and 10 units at night  .  Multiple Vitamins-Minerals (MULTIVITAMIN WITH MINERALS) tablet Take 1 tablet by mouth daily.  Vladimir Faster Glycol-Propyl Glycol (SYSTANE) 0.4-0.3 % SOLN Apply to eye as directed.                Objective:   Physical Exam  amb arabic male nad    10/13/2017      142   09/25/17 150 lb (68 kg)  09/19/17 151 lb (68.5 kg)  09/17/17 151 lb 8 oz (68.7 kg)      Vital signs reviewed - Note on arrival 02 sats  96% on RA         HEENT: nl dentition, turbinates bilaterally, and oropharynx. Nl external ear canals without cough reflex   NECK :  without JVD/Nodes/TM/ nl carotid  upstrokes bilaterally   LUNGS: no acc muscle use,  Nl contour chest with subtle  insp crackles R > L base without cough on insp or exp maneuvers   CV:  RRR  no ? Summation gallop and I/VI sem s  increase in P2, and no edema   ABD:  soft and nontender with nl inspiratory excursion in the supine position. No bruits or organomegaly appreciated, bowel sounds nl  MS:  Nl gait/ ext warm without deformities, calf tenderness, cyanosis or clubbing No obvious joint restrictions   SKIN: warm and dry without lesions    NEURO:  alert, approp, nl sensorium with  no motor or cerebellar deficits apparent.        Labs reviewed 10/13/2017       Chemistry      Component Value Date/Time   NA 137 09/15/2017 0856   NA 137 06/20/2017 1613   K 5.2 (H) 09/15/2017 0856   CL 110 (H) 09/15/2017 0856   CO2 19 (L) 09/15/2017 0856   BUN 70 (H) 09/15/2017 0856   BUN 62 (H) 06/20/2017 1613   CREATININE 3.53 (HH) 09/15/2017 0856   CREATININE 1.23 08/31/2015 0859      Component Value Date/Time   CALCIUM 8.8 09/15/2017 0856   ALKPHOS 101 09/15/2017 0856   AST 22 09/15/2017 0856   ALT 10 09/15/2017 0856   BILITOT 0.6 09/15/2017 0856        Lab Results  Component Value Date   WBC 3.4 (L) 09/15/2017   HGB 7.7 (L) 09/12/2017   HCT 25.4 (L) 09/15/2017   MCV 74.9 (L) 09/15/2017   PLT 63 (L) 09/15/2017        EOS                       0.1                                                                   09/25/2017   No results found for: DDIMER    Lab Results  Component Value Date   TSH 3.27 09/25/2017     Lab Results  Component Value Date   PROBNP 380.0 (H) 09/25/2017       Lab Results  Component Value Date   ESRSEDRATE 42 (H) 09/25/2017            I personally reviewed images and agree with radiology impression as follows:   Chest CT 09/17/17 1. Mild motion degradation. 2. Relatively  diffuse ground-glass and airspace disease with concurrent cardiomegaly and bilateral pleural  effusions. Favor congestive heart failure. Concurrent infection (including atypicals), pulmonary hemorrhage or aspiration could look similar. 3. Coronary artery atherosclerosis. Aortic Atherosclerosis (ICD10-I70.0). 4. Esophageal air fluid level suggests dysmotility or gastroesophageal reflux. 5. Cirrhosis.  Suspect portal venous hypertension. 6. Thoracic adenopathy is most likely reactive    Assessment:

## 2017-10-13 NOTE — Patient Instructions (Addendum)
Continue pepcid (famotidine) 20 mg after supper or at bedtime, whichever is easiest to remember, and add pantoprazole 40 mg Take 30-60 min before first meal of the day x on month to see if daytime cough better and if not then stop it   Pulmonary follow up is as needed once your kidney doctor and heart doctor have completed their evaluation if they still feel you have a lung problem that's not caused by your kidneys or heart.      () 20                40   30-60                                                  .  tuasil bybsyd (famwtydyn) 20 muligh baed [place]' 'aw fi waqt alnuwm , 'ayhuma 'ashal liltadhakur , wa'udufu 'iilayh bantubrazul 40 maligh khudh 30-60 daqiqatan qabl alwajbat al'uwlaa min alyawm s

## 2017-10-14 ENCOUNTER — Encounter: Payer: Self-pay | Admitting: Cardiovascular Disease

## 2017-10-14 ENCOUNTER — Ambulatory Visit (INDEPENDENT_AMBULATORY_CARE_PROVIDER_SITE_OTHER): Payer: Medicare Other | Admitting: Cardiovascular Disease

## 2017-10-14 VITALS — BP 156/58 | HR 93 | Ht 64.0 in | Wt 145.0 lb

## 2017-10-14 DIAGNOSIS — I509 Heart failure, unspecified: Secondary | ICD-10-CM | POA: Diagnosis not present

## 2017-10-14 DIAGNOSIS — I5189 Other ill-defined heart diseases: Secondary | ICD-10-CM

## 2017-10-14 HISTORY — DX: Other ill-defined heart diseases: I51.89

## 2017-10-14 NOTE — Patient Instructions (Addendum)
Medication Instructions:  Your physician recommends that you continue on your current medications as directed. Please refer to the Current Medication list given to you today.  Labwork: NONE  Testing/Procedures: Your physician has requested that you have an echocardiogram. Echocardiography is a painless test that uses sound waves to create images of your heart. It provides your doctor with information about the size and shape of your heart and how well your heart's chambers and valves are working. This procedure takes approximately one hour. There are no restrictions for this procedure.  Follow-Up: Your physician wants you to follow-up as needed with Dr. Johnsie Cancel.    If you need a refill on your cardiac medications before your next appointment, please call your pharmacy.

## 2017-10-20 ENCOUNTER — Encounter: Payer: Self-pay | Admitting: Internal Medicine

## 2017-10-20 NOTE — Assessment & Plan Note (Signed)
Onset 2011/ blood streaking since 07/2017 - Sinus CT 09/29/2017 >>>  1. Symmetric appearing nasal cavity mucosal thickening raising the possibility of rhinitis. 2. Mild or at most moderate mucosal thickening in the right maxillary sinus, but significance is questionable - Allergy profile 09/25/17 >  Eos 0.1 /  IgE  28  RAST pos dust     Suspect this is multifactorial but not primarily a lung issue.  He may have gerd as noct cough improved on h2 so rec trial of ppi ac and reviewed diet   He clearly has component of vol overload related to CRI and may have component of chf as well > rec keep appts with cards/ renal and if not improved then return here for another look  I had an extended discussion with the patient via son serving as interpreter reviewing all relevant studies completed to date and  lasting 15 to 20 minutes of a 25 minute visit    Each maintenance medication was reviewed in detail including most importantly the difference between maintenance and prns and under what circumstances the prns are to be triggered using an action plan format that is not reflected in the computer generated alphabetically organized AVS.    Please see AVS for specific instructions(translated into arabic with Designer, television/film set)  unique to this visit that I personally wrote and verbalized to the the pt in detail and then reviewed with pt  by my nurse highlighting any  changes in therapy recommended at today's visit to their plan of care.

## 2017-10-20 NOTE — Assessment & Plan Note (Addendum)
Echo still pending/ also anemia noted > f/u arranged  If problem not improved p these issues resolved then needs to return here with full pfts to sort out further but no need to return until this w/u is completed/ advised

## 2017-10-21 ENCOUNTER — Other Ambulatory Visit: Payer: Self-pay

## 2017-10-21 ENCOUNTER — Ambulatory Visit (HOSPITAL_COMMUNITY): Payer: Medicare Other | Attending: Cardiology

## 2017-10-21 DIAGNOSIS — I509 Heart failure, unspecified: Secondary | ICD-10-CM | POA: Diagnosis not present

## 2017-10-21 DIAGNOSIS — I110 Hypertensive heart disease with heart failure: Secondary | ICD-10-CM | POA: Insufficient documentation

## 2017-10-21 DIAGNOSIS — E785 Hyperlipidemia, unspecified: Secondary | ICD-10-CM | POA: Insufficient documentation

## 2017-10-21 DIAGNOSIS — E119 Type 2 diabetes mellitus without complications: Secondary | ICD-10-CM | POA: Diagnosis not present

## 2017-10-23 ENCOUNTER — Other Ambulatory Visit (HOSPITAL_COMMUNITY): Payer: Self-pay | Admitting: *Deleted

## 2017-10-23 DIAGNOSIS — D631 Anemia in chronic kidney disease: Secondary | ICD-10-CM

## 2017-10-23 DIAGNOSIS — N2581 Secondary hyperparathyroidism of renal origin: Secondary | ICD-10-CM | POA: Diagnosis not present

## 2017-10-23 DIAGNOSIS — N183 Chronic kidney disease, stage 3 unspecified: Secondary | ICD-10-CM

## 2017-10-23 DIAGNOSIS — I129 Hypertensive chronic kidney disease with stage 1 through stage 4 chronic kidney disease, or unspecified chronic kidney disease: Secondary | ICD-10-CM | POA: Diagnosis not present

## 2017-10-23 DIAGNOSIS — N184 Chronic kidney disease, stage 4 (severe): Secondary | ICD-10-CM | POA: Diagnosis not present

## 2017-10-24 ENCOUNTER — Ambulatory Visit (HOSPITAL_COMMUNITY)
Admission: RE | Admit: 2017-10-24 | Discharge: 2017-10-24 | Disposition: A | Discharge status: Home / Self Care | Payer: Medicare Other | Source: Ambulatory Visit | Attending: Nephrology | Admitting: Nephrology

## 2017-10-24 DIAGNOSIS — D631 Anemia in chronic kidney disease: Secondary | ICD-10-CM | POA: Diagnosis not present

## 2017-10-24 DIAGNOSIS — N183 Chronic kidney disease, stage 3 (moderate): Secondary | ICD-10-CM | POA: Insufficient documentation

## 2017-10-24 LAB — IRON AND TIBC
IRON: 60 ug/dL (ref 45–182)
Saturation Ratios: 21 % (ref 17.9–39.5)
TIBC: 280 ug/dL (ref 250–450)
UIBC: 220 ug/dL

## 2017-10-24 LAB — POCT HEMOGLOBIN-HEMACUE: HEMOGLOBIN: 9.9 g/dL — AB (ref 13.0–17.0)

## 2017-10-24 LAB — FERRITIN: FERRITIN: 377 ng/mL — AB (ref 24–336)

## 2017-10-24 MED ORDER — EPOETIN ALFA 10000 UNIT/ML IJ SOLN
INTRAMUSCULAR | Status: AC
  Administered 2017-10-24: 10:00:00 10000 [IU] via SUBCUTANEOUS
  Filled 2017-10-24: qty 1

## 2017-10-24 MED ORDER — EPOETIN ALFA 20000 UNIT/ML IJ SOLN: 10000.0000 [IU] | Freq: Once | INTRAMUSCULAR | Status: DC

## 2017-10-30 ENCOUNTER — Telehealth: Payer: Self-pay | Admitting: Cardiovascular Disease

## 2017-10-30 ENCOUNTER — Telehealth: Payer: Self-pay

## 2017-10-30 NOTE — Telephone Encounter (Signed)
-----   Message from Josue Hector, MD sent at 10/21/2017 11:42 AM EDT ----- EF normal just mild MR overall good

## 2017-10-30 NOTE — Telephone Encounter (Signed)
Left 3 messages for patient to call back for echo results. Will send letter.

## 2017-10-30 NOTE — Telephone Encounter (Signed)
Notes recorded by Josue Hector, MD on 10/21/2017 at 11:42 AM EDT EF normal just mild MR overall good  Patient aware of echo results.

## 2017-10-30 NOTE — Telephone Encounter (Signed)
New Message:    Pt returning call for Echo results

## 2017-10-31 DIAGNOSIS — H6091 Unspecified otitis externa, right ear: Secondary | ICD-10-CM | POA: Diagnosis not present

## 2017-11-06 ENCOUNTER — Other Ambulatory Visit: Payer: Self-pay

## 2017-11-06 ENCOUNTER — Other Ambulatory Visit (HOSPITAL_COMMUNITY): Payer: Self-pay | Admitting: *Deleted

## 2017-11-06 ENCOUNTER — Emergency Department (HOSPITAL_COMMUNITY): Payer: Medicare Other

## 2017-11-06 ENCOUNTER — Inpatient Hospital Stay (HOSPITAL_COMMUNITY)
Admission: EM | Admit: 2017-11-06 | Discharge: 2017-11-18 | DRG: 291 | Disposition: A | Discharge status: Home / Self Care | Payer: Medicare Other | Attending: Internal Medicine | Admitting: Internal Medicine

## 2017-11-06 ENCOUNTER — Encounter (HOSPITAL_COMMUNITY): Payer: Self-pay

## 2017-11-06 DIAGNOSIS — N179 Acute kidney failure, unspecified: Secondary | ICD-10-CM | POA: Diagnosis present

## 2017-11-06 DIAGNOSIS — D631 Anemia in chronic kidney disease: Secondary | ICD-10-CM | POA: Diagnosis not present

## 2017-11-06 DIAGNOSIS — Z823 Family history of stroke: Secondary | ICD-10-CM | POA: Diagnosis not present

## 2017-11-06 DIAGNOSIS — M310 Hypersensitivity angiitis: Secondary | ICD-10-CM | POA: Diagnosis not present

## 2017-11-06 DIAGNOSIS — K219 Gastro-esophageal reflux disease without esophagitis: Secondary | ICD-10-CM | POA: Diagnosis present

## 2017-11-06 DIAGNOSIS — E872 Acidosis: Secondary | ICD-10-CM | POA: Diagnosis present

## 2017-11-06 DIAGNOSIS — I5033 Acute on chronic diastolic (congestive) heart failure: Secondary | ICD-10-CM | POA: Diagnosis present

## 2017-11-06 DIAGNOSIS — I130 Hypertensive heart and chronic kidney disease with heart failure and stage 1 through stage 4 chronic kidney disease, or unspecified chronic kidney disease: Secondary | ICD-10-CM | POA: Diagnosis not present

## 2017-11-06 DIAGNOSIS — Z833 Family history of diabetes mellitus: Secondary | ICD-10-CM

## 2017-11-06 DIAGNOSIS — H6090 Unspecified otitis externa, unspecified ear: Secondary | ICD-10-CM | POA: Diagnosis not present

## 2017-11-06 DIAGNOSIS — I10 Essential (primary) hypertension: Secondary | ICD-10-CM | POA: Insufficient documentation

## 2017-11-06 DIAGNOSIS — N189 Chronic kidney disease, unspecified: Secondary | ICD-10-CM

## 2017-11-06 DIAGNOSIS — Z794 Long term (current) use of insulin: Secondary | ICD-10-CM | POA: Diagnosis not present

## 2017-11-06 DIAGNOSIS — J9621 Acute and chronic respiratory failure with hypoxia: Secondary | ICD-10-CM | POA: Diagnosis not present

## 2017-11-06 DIAGNOSIS — E1129 Type 2 diabetes mellitus with other diabetic kidney complication: Secondary | ICD-10-CM | POA: Diagnosis present

## 2017-11-06 DIAGNOSIS — D61818 Other pancytopenia: Secondary | ICD-10-CM | POA: Diagnosis present

## 2017-11-06 DIAGNOSIS — B659 Schistosomiasis, unspecified: Secondary | ICD-10-CM | POA: Diagnosis not present

## 2017-11-06 DIAGNOSIS — N019 Rapidly progressive nephritic syndrome with unspecified morphologic changes: Secondary | ICD-10-CM

## 2017-11-06 DIAGNOSIS — R042 Hemoptysis: Secondary | ICD-10-CM | POA: Diagnosis not present

## 2017-11-06 DIAGNOSIS — J9601 Acute respiratory failure with hypoxia: Secondary | ICD-10-CM | POA: Diagnosis present

## 2017-11-06 DIAGNOSIS — E11319 Type 2 diabetes mellitus with unspecified diabetic retinopathy without macular edema: Secondary | ICD-10-CM | POA: Diagnosis not present

## 2017-11-06 DIAGNOSIS — Z23 Encounter for immunization: Secondary | ICD-10-CM | POA: Diagnosis not present

## 2017-11-06 DIAGNOSIS — E875 Hyperkalemia: Secondary | ICD-10-CM | POA: Diagnosis not present

## 2017-11-06 DIAGNOSIS — E1122 Type 2 diabetes mellitus with diabetic chronic kidney disease: Secondary | ICD-10-CM | POA: Diagnosis not present

## 2017-11-06 DIAGNOSIS — K7460 Unspecified cirrhosis of liver: Secondary | ICD-10-CM | POA: Diagnosis present

## 2017-11-06 DIAGNOSIS — N184 Chronic kidney disease, stage 4 (severe): Secondary | ICD-10-CM

## 2017-11-06 DIAGNOSIS — R05 Cough: Secondary | ICD-10-CM | POA: Diagnosis present

## 2017-11-06 DIAGNOSIS — I509 Heart failure, unspecified: Secondary | ICD-10-CM

## 2017-11-06 HISTORY — DX: Chronic kidney disease, unspecified: N18.9

## 2017-11-06 HISTORY — DX: Heart disease, unspecified: I51.9

## 2017-11-06 HISTORY — DX: Unspecified cirrhosis of liver: K74.60

## 2017-11-06 HISTORY — DX: Personal history of diseases of the blood and blood-forming organs and certain disorders involving the immune mechanism: Z86.2

## 2017-11-06 HISTORY — DX: Chronic kidney disease, stage 4 (severe): N18.4

## 2017-11-06 HISTORY — DX: Other pancytopenia: D61.818

## 2017-11-06 LAB — CBC WITH DIFFERENTIAL/PLATELET
BASOS ABS: 0 10*3/uL (ref 0.0–0.1)
BASOS PCT: 0 %
EOS ABS: 0.1 10*3/uL (ref 0.0–0.7)
Eosinophils Relative: 2 %
HCT: 25.3 % — ABNORMAL LOW (ref 39.0–52.0)
Hemoglobin: 8 g/dL — ABNORMAL LOW (ref 13.0–17.0)
Lymphocytes Relative: 20 %
Lymphs Abs: 0.7 10*3/uL (ref 0.7–4.0)
MCH: 23.4 pg — ABNORMAL LOW (ref 26.0–34.0)
MCHC: 31.6 g/dL (ref 30.0–36.0)
MCV: 74 fL — ABNORMAL LOW (ref 78.0–100.0)
MONO ABS: 0.4 10*3/uL (ref 0.1–1.0)
MONOS PCT: 10 %
Neutro Abs: 2.4 10*3/uL (ref 1.7–7.7)
Neutrophils Relative %: 68 %
PLATELETS: 66 10*3/uL — AB (ref 150–400)
RBC: 3.42 MIL/uL — ABNORMAL LOW (ref 4.22–5.81)
RDW: 15.2 % (ref 11.5–15.5)
WBC: 3.5 10*3/uL — ABNORMAL LOW (ref 4.0–10.5)

## 2017-11-06 LAB — BASIC METABOLIC PANEL
Anion gap: 12 (ref 5–15)
BUN: 105 mg/dL — ABNORMAL HIGH (ref 6–20)
CALCIUM: 8.1 mg/dL — AB (ref 8.9–10.3)
CO2: 17 mmol/L — ABNORMAL LOW (ref 22–32)
CREATININE: 5.47 mg/dL — AB (ref 0.61–1.24)
Chloride: 106 mmol/L (ref 101–111)
GFR, EST AFRICAN AMERICAN: 11 mL/min — AB (ref >60)
GFR, EST NON AFRICAN AMERICAN: 10 mL/min — AB (ref >60)
Glucose, Bld: 117 mg/dL — ABNORMAL HIGH (ref 65–99)
Potassium: 4.6 mmol/L (ref 3.5–5.1)
SODIUM: 135 mmol/L (ref 135–145)

## 2017-11-06 LAB — I-STAT TROPONIN, ED: TROPONIN I, POC: 0.01 ng/mL (ref 0.00–0.08)

## 2017-11-06 LAB — BRAIN NATRIURETIC PEPTIDE: B Natriuretic Peptide: 758.5 pg/mL — ABNORMAL HIGH (ref 0.0–100.0)

## 2017-11-06 MED ORDER — FUROSEMIDE 10 MG/ML IJ SOLN
40.0000 mg | Freq: Once | INTRAMUSCULAR | Status: AC
  Administered 2017-11-06: 40 mg via INTRAVENOUS
  Filled 2017-11-06: qty 4

## 2017-11-06 MED ORDER — PANTOPRAZOLE SODIUM 40 MG PO TBEC
40.0000 mg | DELAYED_RELEASE_TABLET | Freq: Every day | ORAL | Status: DC
  Administered 2017-11-07 – 2017-11-18 (×12): 40 mg via ORAL
  Filled 2017-11-06 (×12): qty 1

## 2017-11-06 MED ORDER — ALBUTEROL SULFATE (2.5 MG/3ML) 0.083% IN NEBU: 5.0000 mg | INHALATION_SOLUTION | RESPIRATORY_TRACT | Status: DC | PRN

## 2017-11-06 MED ORDER — INSULIN ASPART PROT & ASPART (70-30 MIX) 100 UNIT/ML ~~LOC~~ SUSP
20.0000 [IU] | Freq: Every day | SUBCUTANEOUS | Status: DC
  Administered 2017-11-07 – 2017-11-12 (×6): 20 [IU] via SUBCUTANEOUS
  Filled 2017-11-06: qty 10

## 2017-11-06 MED ORDER — INSULIN ASPART PROT & ASPART (70-30 MIX) 100 UNIT/ML ~~LOC~~ SUSP
7.0000 [IU] | Freq: Every day | SUBCUTANEOUS | Status: DC
  Administered 2017-11-08 – 2017-11-11 (×4): 7 [IU] via SUBCUTANEOUS

## 2017-11-06 MED ORDER — ALBUTEROL SULFATE (2.5 MG/3ML) 0.083% IN NEBU
5.0000 mg | INHALATION_SOLUTION | Freq: Once | RESPIRATORY_TRACT | Status: AC
  Administered 2017-11-06: 5 mg via RESPIRATORY_TRACT
  Filled 2017-11-06: qty 6

## 2017-11-06 MED ORDER — DM-GUAIFENESIN ER 30-600 MG PO TB12
1.0000 | ORAL_TABLET | Freq: Two times a day (BID) | ORAL | Status: DC | PRN
  Administered 2017-11-07 – 2017-11-13 (×3): 1 via ORAL
  Filled 2017-11-06 (×3): qty 1

## 2017-11-06 MED ORDER — AMOXICILLIN 250 MG/5ML PO SUSR
875.0000 mg | Freq: Two times a day (BID) | ORAL | Status: DC
  Filled 2017-11-06 (×2): qty 20

## 2017-11-06 MED ORDER — ZOLPIDEM TARTRATE 5 MG PO TABS: 5.0000 mg | ORAL_TABLET | Freq: Every evening | ORAL | Status: DC | PRN

## 2017-11-06 MED ORDER — FERROUS GLUCONATE 324 (38 FE) MG PO TABS
324.0000 mg | ORAL_TABLET | Freq: Every day | ORAL | Status: DC
  Administered 2017-11-07 – 2017-11-18 (×12): 324 mg via ORAL
  Filled 2017-11-06 (×12): qty 1

## 2017-11-06 MED ORDER — AMLODIPINE BESYLATE 10 MG PO TABS
10.0000 mg | ORAL_TABLET | Freq: Every day | ORAL | Status: DC
  Administered 2017-11-07 – 2017-11-18 (×12): 10 mg via ORAL
  Filled 2017-11-06 (×12): qty 1

## 2017-11-06 MED ORDER — CARVEDILOL 12.5 MG PO TABS
12.5000 mg | ORAL_TABLET | Freq: Two times a day (BID) | ORAL | Status: DC
  Administered 2017-11-07 – 2017-11-18 (×20): 12.5 mg via ORAL
  Filled 2017-11-06 (×20): qty 1

## 2017-11-06 MED ORDER — ADULT MULTIVITAMIN W/MINERALS CH
1.0000 | ORAL_TABLET | Freq: Every day | ORAL | Status: DC
  Administered 2017-11-07 – 2017-11-18 (×12): 1 via ORAL
  Filled 2017-11-06 (×12): qty 1

## 2017-11-06 MED ORDER — SODIUM CHLORIDE 0.9 % IV SOLN
250.0000 mL | INTRAVENOUS | Status: DC | PRN
  Administered 2017-11-10: 250 mL via INTRAVENOUS

## 2017-11-06 MED ORDER — SODIUM CHLORIDE 0.9% FLUSH
3.0000 mL | Freq: Two times a day (BID) | INTRAVENOUS | Status: DC
  Administered 2017-11-07 – 2017-11-18 (×22): 3 mL via INTRAVENOUS

## 2017-11-06 MED ORDER — HYDRALAZINE HCL 20 MG/ML IJ SOLN: 5.0000 mg | INTRAMUSCULAR | Status: DC | PRN

## 2017-11-06 MED ORDER — NEOMYCIN-POLYMYXIN-HC 3.5-10000-1 OT SUSP
4.0000 [drp] | Freq: Three times a day (TID) | OTIC | Status: DC
  Administered 2017-11-07 – 2017-11-09 (×7): 4 [drp] via OTIC
  Filled 2017-11-06: qty 10

## 2017-11-06 MED ORDER — ACETAMINOPHEN 325 MG PO TABS
650.0000 mg | ORAL_TABLET | ORAL | Status: DC | PRN
  Administered 2017-11-17: 650 mg via ORAL

## 2017-11-06 MED ORDER — FUROSEMIDE 40 MG PO TABS
40.0000 mg | ORAL_TABLET | Freq: Every day | ORAL | Status: DC
  Administered 2017-11-07: 40 mg via ORAL
  Filled 2017-11-06: qty 1

## 2017-11-06 MED ORDER — INSULIN ASPART 100 UNIT/ML ~~LOC~~ SOLN
0.0000 [IU] | Freq: Three times a day (TID) | SUBCUTANEOUS | Status: DC
  Administered 2017-11-07: 5 [IU] via SUBCUTANEOUS
  Administered 2017-11-07: 2 [IU] via SUBCUTANEOUS
  Administered 2017-11-08 (×2): 5 [IU] via SUBCUTANEOUS
  Administered 2017-11-09 (×2): 2 [IU] via SUBCUTANEOUS
  Administered 2017-11-09 – 2017-11-10 (×2): 3 [IU] via SUBCUTANEOUS
  Administered 2017-11-11: 5 [IU] via SUBCUTANEOUS
  Administered 2017-11-11: 7 [IU] via SUBCUTANEOUS
  Administered 2017-11-11: 2 [IU] via SUBCUTANEOUS
  Administered 2017-11-12: 10 [IU] via SUBCUTANEOUS
  Administered 2017-11-12: 2 [IU] via SUBCUTANEOUS
  Administered 2017-11-12: 5 [IU] via SUBCUTANEOUS
  Administered 2017-11-13: 7 [IU] via SUBCUTANEOUS
  Administered 2017-11-13: 10 [IU] via SUBCUTANEOUS
  Administered 2017-11-14: 2 [IU] via SUBCUTANEOUS
  Administered 2017-11-14: 3 [IU] via SUBCUTANEOUS
  Administered 2017-11-15: 2 [IU] via SUBCUTANEOUS
  Administered 2017-11-15: 3 [IU] via SUBCUTANEOUS
  Administered 2017-11-15: 7 [IU] via SUBCUTANEOUS
  Administered 2017-11-16 (×2): 3 [IU] via SUBCUTANEOUS
  Administered 2017-11-16: 2 [IU] via SUBCUTANEOUS
  Administered 2017-11-17 (×2): 3 [IU] via SUBCUTANEOUS
  Administered 2017-11-18: 1 [IU] via SUBCUTANEOUS
  Administered 2017-11-18: 2 [IU] via SUBCUTANEOUS

## 2017-11-06 MED ORDER — SODIUM CHLORIDE 0.9% FLUSH
3.0000 mL | INTRAVENOUS | Status: DC | PRN
  Administered 2017-11-18: 3 mL via INTRAVENOUS
  Filled 2017-11-06: qty 3

## 2017-11-06 MED ORDER — ONDANSETRON HCL 4 MG/2ML IJ SOLN
4.0000 mg | Freq: Four times a day (QID) | INTRAMUSCULAR | Status: DC | PRN
  Administered 2017-11-12: 4 mg via INTRAVENOUS
  Filled 2017-11-06: qty 2

## 2017-11-06 NOTE — ED Provider Notes (Signed)
[facility] EMERGENCY DEPARTMENT Provider Note   CSN: [id number] Arrival date & time: 11/06/17  1639     History   Chief Complaint Chief Complaint  Patient presents with  . Cough    HPI William [name] is a 67 y.o. male.   Shortness of Breath  This is a new problem. The average episode lasts 1 week. The problem occurs continuously.The current episode started more than 2 days ago. The problem has been gradually worsening. Associated symptoms include cough, sputum production, hemoptysis, orthopnea and leg swelling. Pertinent negatives include no fever, no headaches, no sore throat, no ear pain, no neck pain, no chest pain, no syncope, no vomiting, no abdominal pain and no rash.  -Patient also recently diagnosed with CKD stage IV.  Past Medical History:  Diagnosis Date  . Diabetes mellitus without complication ([place])   . Hypertension     Patient Active Problem List   Diagnosis Date Noted  . Dyspnea on exertion 09/25/2017  . Type 2 diabetes mellitus without complication, with long-term current use of insulin ([place]) 08/25/2015  . Chronic cough 06/10/2013  . Chronic neck pain 06/10/2013    Past Surgical History:  Procedure Laterality Date  . NECK SURGERY          Home Medications    Prior to Admission medications   Medication Sig Start Date End Date Taking? Authorizing Provider  amLODipine (NORVASC) 10 MG tablet Take 1 tablet (10 mg total) daily by mouth. 06/20/17 09/18/17  Gildardo Pounds, NP  famotidine (PEPCID) 20 MG tablet Take 20 mg by mouth 2 (two) times daily.    [provider]  ferrous gluconate (IRON 27) 240 (27 FE) MG tablet Take 1 tablet by mouth daily.    [provider]  furosemide (LASIX) 20 MG tablet Take 20 mg by mouth daily.    [provider]  insulin NPH-insulin regular (NOVOLIN 70/30) (70-30) 100 UNIT/ML injection Inject into the skin 2 (two) times daily with a meal. 30 units in am and 10 units at night     [provider]  Multiple Vitamins-Minerals (MULTIVITAMIN WITH MINERALS) tablet Take 1 tablet by mouth daily.    [provider]  pantoprazole (PROTONIX) 40 MG tablet Take 1 tablet (40 mg total) by mouth daily. Take 30-60 min before first meal of the day 10/13/17   Tanda Rockers, MD  Polyethyl Glycol-Propyl Glycol (SYSTANE) 0.4-0.3 % SOLN Apply to eye as directed.    [provider]    Family History History reviewed. No pertinent family history.  Social History Social History   Tobacco Use  . Smoking status: Never Smoker  . Smokeless tobacco: Never Used  Substance Use Topics  . Alcohol use: No  . Drug use: Not on file     Allergies   Patient has no known allergies.   Review of Systems Review of Systems  Constitutional: Positive for fatigue. Negative for chills and fever.  HENT: Negative for ear pain and sore throat.   Eyes: Negative for pain and visual disturbance.  Respiratory: Positive for cough, hemoptysis, sputum production and shortness of breath.   Cardiovascular: Positive for orthopnea and leg swelling. Negative for chest pain, palpitations and syncope.  Gastrointestinal: Negative for abdominal pain, nausea and vomiting.  Genitourinary: Negative for dysuria and hematuria.  Musculoskeletal: Negative for back pain and neck pain.  Skin: Negative for color change and rash.  Neurological: Negative for syncope, light-headedness and headaches.  All other systems reviewed and are  negative.    Physical Exam Updated Vital Signs Ht 5' 5"  (1.651 m)   Wt 65.8 kg (145 lb)   BMI 24.13 kg/m   Physical Exam  Constitutional: He appears well-developed and well-nourished.  HENT:  Head: Normocephalic and atraumatic.  Mouth/Throat: Oropharynx is clear and moist.  Eyes: Conjunctivae are normal.  Neck: Neck supple.  Cardiovascular: Normal rate and regular rhythm.  No murmur heard. Pulmonary/Chest: Effort normal. No respiratory distress. He has  rales (diffuse rales worst in the bases).  Abdominal: Soft. He exhibits no distension. There is no tenderness. There is no guarding.  Musculoskeletal: He exhibits edema (1+ edema).  Neurological: He is alert.  Skin: Skin is warm and dry.  Psychiatric: He has a normal mood and affect.  Nursing note and vitals reviewed.    ED Treatments / Results  Labs (all labs ordered are listed, but only abnormal results are displayed) Labs Reviewed  CBC WITH DIFFERENTIAL/PLATELET - Abnormal; Notable for the following components:      Result Value   WBC 3.5 (*)    RBC 3.42 (*)    Hemoglobin 8.0 (*)    HCT 25.3 (*)    MCV 74.0 (*)    MCH 23.4 (*)    Platelets 66 (*)    All other components within normal limits  BASIC METABOLIC PANEL - Abnormal; Notable for the following components:   CO2 17 (*)    Glucose, Bld 117 (*)    BUN 105 (*)    Creatinine, Ser 5.47 (*)    Calcium 8.1 (*)    GFR calc non Af Amer 10 (*)    GFR calc Af Amer 11 (*)    All other components within normal limits  BRAIN NATRIURETIC PEPTIDE - Abnormal; Notable for the following components:   B Natriuretic Peptide 758.5 (*)    All other components within normal limits  BASIC METABOLIC PANEL  I-STAT TROPONIN, ED    EKG EKG Interpretation  Date/Time:  Thursday November 06 2017 16:55:54 EDT Ventricular Rate:  85 PR Interval:  136 QRS Duration: 72 QT Interval:  346 QTC Calculation: 411 R Axis:   40 Text Interpretation:  Normal sinus rhythm Possible Left atrial enlargement Nonspecific ST abnormality Abnormal ECG No old tracing to compare Confirmed by Varney Biles [id number]) on 11/06/2017 9:18:15 PM   Radiology Dg Chest 2 View  Result Date: 11/06/2017 CLINICAL DATA:  Cough, coughing up blood EXAM: CHEST - 2 VIEW COMPARISON:  09/18/2017 FINDINGS: Diffuse bilateral interstitial and alveolar airspace opacities. No pleural effusion or pneumothorax. Stable cardiomediastinal silhouette. No aggressive osseous lesion. IMPRESSION:  Bilateral interstitial and alveolar airspace opacities. Differential diagnosis includes pulmonary edema versus multi lobar pneumonia. Electronically Signed   By: Kathreen Devoid   On: 11/06/2017 18:35    Procedures Procedures (including critical care time)  Medications Ordered in ED Medications  albuterol (PROVENTIL) (2.5 MG/3ML) 0.083% nebulizer solution 5 mg (has no administration in time range)     Initial Impression / Assessment and Plan / ED Course  I have reviewed the triage vital signs and the nursing notes.  Pertinent labs & imaging results that were available during my care of the patient were reviewed by me and considered in my medical decision making (see chart for details).     Patient is a 67 year old male with history of diabetes and hypertension with recent diagnosis of CKD stage IV who presents with 1 week of worsening shortness of breath, orthopnea, cough, and few days of hemoptysis.  On exam patient has diffuse rales and 1+ pitting edema bilateral lower extremities.  No asymmetrical calf swelling suggestive of DVT.  She denies ever having any chest pain, fevers or chills.  Presentation most consistent with new onset CHF given his elevated BNP of 750 and signs of fluid overload.  The hemoptysis is likely secondary to the week of coughing and a mucosal tear.  However given his recent diagnosis of CKD stage IV as well as hemoptysis should be consideration for vasculitis as well.  This can be further worked up inpatient.  IV Lasix given in the ED.  Doubt frank pulmonary hemorrhage at this time.  He has symmetric signs of pulmonary edema on chest x-ray.  Multifocal pneumonias in the differential, however given no fever and no leukocytosis this is more consistent with edema.  Troponin is negative.  EKG does not show any specific ischemia.  Otherwise shows normal sinus rhythm with a rate of 85.  Patient admitted to medicine for further workup and treatment.  Final Clinical  Impressions(s) / ED Diagnoses   Final diagnoses:  Acute on chronic congestive heart failure, unspecified heart failure type [facility])    ED Discharge Orders    None       Tobie Poet, DO 11/07/17 0040    Varney Biles, MD 11/08/17 1534

## 2017-11-06 NOTE — ED Notes (Signed)
Tempted to call report. Secretary states nurse can not take report because nurse went into another room.

## 2017-11-06 NOTE — ED Notes (Signed)
On arrival to room pt was 86-89 on room air.

## 2017-11-06 NOTE — Progress Notes (Signed)
This nurse called and received report from  ED RN.

## 2017-11-06 NOTE — ED Triage Notes (Signed)
Pt presents with cough "for a long time" that has recently worsened.  Son is translating due to technical difficulties with translator ipad.  Pt reports coughing up blood recently, reports shortness of breath and fatigue. Son reports no appetite, pt not eating.  Pt seen last week at urgent care and treated for R ear infection.

## 2017-11-06 NOTE — H&P (Signed)
History and Physical    William [name] BMW:[id number] DOB: 04-30-1951 DOA: 11/06/2017  Referring MD/NP/PA:   PCP: Gildardo Pounds, NP   Patient coming from:  The patient is coming from home.  At baseline, pt is independent for most of ADL.      Chief Complaint: cough and hemoptysis  HPI: William [name] is a 67 y.o. male with medical history significant of chronic cough, hypertension, diabetes mellitus, GERD, iron deficiency anemia, CHF, CKD-4, liver cirrhosis, pancytopenia, who presents with cough and hemoptysis.  Pt dose not speak English and history is obtained with his son's translation. Pt has been chronically cough for several years, which has worsened recently. Patient coughs up streaks of blood intermittently. She has some mild shortness of breath, but no chest pain. No fever or chills. Patient does not have nausea, vomit, abdominal pain, diarrhea, symptoms of UTI or unilateral weakness. No night sweats. No recent long distant traveling. No tenderness in calf areas.  Pt was seen by pulmonologist Dr. Melvyn Novas as outpt. He had extensive workup without clear diagnosis and is suspected to have multiple factorial etiologies, including sinusitis, GERD,dCHF. ANA negative. He had CT-chest on 09/07/17 which showed diffuse ground-glass and airspace disease with concurrent cardiomegaly and bilateral pleural effusions, indicating possible CHF, though atypical infection, pulmonary hemorrhage or aspiration are also possible. CT-of maxillofacial showed possible rhinitis and sinusitis. Pt was put on amoxicillin on 10/31/17 (for 10 days). Pt was also seen by cardiologist, Dr. Johnsie Cancel for CHF. Pt had 2D echo on 10/21/17, which showed EF 65-70 percent with grade 1 diastolic dysfunction. Pt was stared on lasix 40 mg daily.   ED Course: pt was found to have pancytopenia with WBC 3.5, hgb 8.0, platelets 66, negative troponin, BNP 758.5, worsening renal function, no fever, no tachycardia, oxygen saturation to 86% on room air.  Chest x-ray showed bilateral airspace disease (alveolar and interstitial). Pt is placed on telemetry bed for observation  Review of Systems:   General: no fevers, chills, no body weight gain, has poor appetite, has fatigue HEENT: no blurry vision, hearing changes or sore throat Respiratory: has dyspnea, coughing, hemoptysis, no wheezing CV: no chest pain, no palpitations GI: no nausea, vomiting, abdominal pain, diarrhea, constipation GU: no dysuria, burning on urination, increased urinary frequency, hematuria  Ext: has leg edema Neuro: no unilateral weakness, numbness, or tingling, no vision change or hearing loss Skin: no rash, no skin tear. MSK: No muscle spasm, no deformity, no limitation of range of movement in spin Heme: No easy bruising.  Travel history: No recent long distant travel.  Allergy: No Known Allergies  Past Medical History:  Diagnosis Date  . CKD (chronic kidney disease), stage IV ([place])   . Diabetes mellitus without complication ([place])   . Hypertension     Past Surgical History:  Procedure Laterality Date  . EYE SURGERY Bilateral    Cataract  . NECK SURGERY      Social History:  reports that he has never smoked. He has never used smokeless tobacco. He reports that he has current or past drug history. He reports that he does not drink alcohol.  Family History:  Family History  Problem Relation Age of Onset  . Diabetes Mellitus II Sister   . Stroke Brother      Prior to Admission medications   Medication Sig Start Date End Date Taking? Authorizing Provider  amLODipine (NORVASC) 10 MG tablet Take 1 tablet (10 mg total) daily by mouth. 06/20/17 09/18/17  Gildardo Pounds, NP  famotidine (PEPCID) 20 MG tablet Take 20 mg by mouth 2 (two) times daily.    [provider]  ferrous gluconate (IRON 27) 240 (27 FE) MG tablet Take 1 tablet by mouth daily.    [provider]  furosemide (LASIX) 20 MG tablet Take 20 mg by mouth daily.    [provider]  insulin NPH-insulin regular (NOVOLIN 70/30) (70-30) 100 UNIT/ML injection Inject into the skin 2 (two) times daily with a meal. 30 units in am and 10 units at night    [provider]  Multiple Vitamins-Minerals (MULTIVITAMIN WITH MINERALS) tablet Take 1 tablet by mouth daily.    [provider]  pantoprazole (PROTONIX) 40 MG tablet Take 1 tablet (40 mg total) by mouth daily. Take 30-60 min before first meal of the day 10/13/17   Tanda Rockers, MD  Polyethyl Glycol-Propyl Glycol (SYSTANE) 0.4-0.3 % SOLN Apply to eye as directed.    [provider]    Physical Exam: Vitals:   11/06/17 1656 11/06/17 2130 11/06/17 2215  BP:  (!) 143/64 139/65  Pulse:  77 79  Resp:  (!) 23 (!) 24  Temp:   98.4 F (36.9 C)  TempSrc:   Oral  SpO2:  94% 97%  Weight: 65.8 kg (145 lb)    Height: 5' 5"  (1.651 m)     General: Not in acute distress HEENT:       Eyes: PERRL, EOMI, no scleral icterus.       ENT: No discharge from the ears and nose, no pharynx injection, no tonsillar enlargement.        Neck: No JVD, no bruit, no mass felt. Heme: No neck lymph node enlargement. Cardiac: S1/S2, RRR, No murmurs, No gallops or rubs. Respiratory: has crackles bilaterally. GI: Soft, nondistended, nontender, no rebound pain, no organomegaly, BS present. GU: No hematuria Ext: 1+ pitting leg edema bilaterally. 2+DP/PT pulse bilaterally. Musculoskeletal: No joint deformities, No joint redness or warmth, no limitation of ROM in spin. Skin: No rashes.  Neuro: Alert, oriented X3, cranial nerves II-XII grossly intact, moves all extremities normally.  Psych: Patient is not psychotic, no suicidal or hemocidal ideation.  Labs on Admission: I have personally reviewed following labs and imaging studies  CBC: Recent Labs  Lab 11/06/17 1700  WBC 3.5*  NEUTROABS 2.4  HGB 8.0*  HCT 25.3*  MCV 74.0*  PLT 66*   Basic Metabolic Panel: Recent Labs  Lab 11/06/17 1700  NA 135  K  4.6  CL 106  CO2 17*  GLUCOSE 117*  BUN 105*  CREATININE 5.47*  CALCIUM 8.1*   GFR: Estimated Creatinine Clearance: 11.6 mL/min (A) (by C-G formula based on SCr of 5.47 mg/dL (H)). Liver Function Tests: No results for input(s): AST, ALT, ALKPHOS, BILITOT, PROT, ALBUMIN in the last 168 hours. No results for input(s): LIPASE, AMYLASE in the last 168 hours. No results for input(s): AMMONIA in the last 168 hours. Coagulation Profile: No results for input(s): INR, PROTIME in the last 168 hours. Cardiac Enzymes: No results for input(s): CKTOTAL, CKMB, CKMBINDEX, TROPONINI in the last 168 hours. BNP (last 3 results) Recent Labs    09/25/17 1614  PROBNP 380.0*   HbA1C: No results for input(s): HGBA1C in the last 72 hours. CBG: No results for input(s): GLUCAP in the last 168 hours. Lipid Profile: No results for input(s): CHOL, HDL, LDLCALC, TRIG, CHOLHDL, LDLDIRECT in the last 72 hours. Thyroid Function Tests: No results for input(s): TSH, T4TOTAL, FREET4, T3FREE, THYROIDAB  in the last 72 hours. Anemia Panel: No results for input(s): VITAMINB12, FOLATE, FERRITIN, TIBC, IRON, RETICCTPCT in the last 72 hours. Urine analysis:    Component Value Date/Time   COLORURINE YELLOW 01/27/2013 2052   APPEARANCEUR CLEAR 01/27/2013 2052   LABSPEC 1.021 01/27/2013 2052   PHURINE 5.5 01/27/2013 2052   GLUCOSEU >1000 (A) 01/27/2013 2052   HGBUR MODERATE (A) 01/27/2013 2052   BILIRUBINUR neg 09/14/2015 1441   KETONESUR NEGATIVE 01/27/2013 2052   PROTEINUR 100 09/14/2015 1441   PROTEINUR >300 (A) 01/27/2013 2052   UROBILINOGEN 0.2 09/14/2015 1441   UROBILINOGEN 0.2 01/27/2013 2052   NITRITE neg 09/14/2015 1441   NITRITE NEGATIVE 01/27/2013 2052   LEUKOCYTESUR Negative 09/14/2015 1441   Sepsis Labs: @LABRCNTIP (procalcitonin:4,lacticidven:4) )No results found for this or any previous visit (from the past 240 hour(s)).   Radiological Exams on Admission: Dg Chest 2 View  Result Date:  11/06/2017 CLINICAL DATA:  Cough, coughing up blood EXAM: CHEST - 2 VIEW COMPARISON:  09/18/2017 FINDINGS: Diffuse bilateral interstitial and alveolar airspace opacities. No pleural effusion or pneumothorax. Stable cardiomediastinal silhouette. No aggressive osseous lesion. IMPRESSION: Bilateral interstitial and alveolar airspace opacities. Differential diagnosis includes pulmonary edema versus multi lobar pneumonia. Electronically Signed   By: Kathreen Devoid   On: 11/06/2017 18:35     EKG: Independently reviewed.  Sinus rhythm, QTC 47, LAE, no ischemic change.   Assessment/Plan Principal Problem:   Acute on chronic respiratory failure with hypoxia (HCC) Active Problems:   Type II diabetes mellitus with renal manifestations (HCC)   Acute on chronic diastolic CHF (congestive heart failure) (HCC)   Pancytopenia (HCC)   Acute renal failure superimposed on stage 4 chronic kidney disease (HCC)   GERD (gastroesophageal reflux disease)   Essential hypertension   Acute on chronic respiratory failure with hypoxia ([place]): Likely due to multifactorial etiology. He has chronic cough/hemoptysis. Has been worked up pulmonologist, Dr. Melvyn Novas, without clear diagnosis. Pt is started on amoxicillin for possible rhinitis. He also has dCHF. Pt has mild fluid overload with 1+leg edema and crackles on lung auscultation, indicating dCHF exacerbation. His worsening renal function may have also contributed to his fluid retention.  -will place on tele bed for obs. -pt was given 40 mg of IV lasix in ED, will observe and check BMP for renal Fx in AM  -prn albuterol nebs, mucinex for cough -continue amoxicillin. -May need to consult pulmonologist in morning for possible bronchoscopy   Acute on chronic diastolic CHF: 2-D echo on 10/21/17 showed EF 65-70% with grade 1 diastolic dysfunction. Patient has 1+ leg edema, worsening shortness of breath, indicating possible CHF exacerbation. -see above  Acute renal failure  superimposed on stage 4 chronic kidney disease: baseline creatinine 3.0-3.5, his creatinine is a 5.47, BUN 105. Patient was seen Dr. Posey Pronto of nephrology, and was told that he almost reached the point of needing dialysis. -f/u renal Fx closely by BMP while giving IV diuretics.  Type II diabetes mellitus with renal manifestations ([place]): Last A1c 8.1 on 06/20/17, poorly controled. Patient is taking NPH insulin at home -will decrease NPH insulin dose from 30-10 to 20-7 U bid -SSI  Pancytopenia ([place]): etiology is not clear. pt was seen by hematologist Dr. Julien Nordmann. Patient had bone marrow biopsy on 09/05/17. Per his note, "most likely reactive in nature based on the most recent bone marrow biopsy and aspirate" -f/u with Dr. Julien Nordmann -continue iron supplement  GERD: -Protonix   HTN:  -Continue home medications:amlodipine, Coreg, -IV hydralazine prn  DVT  ppx: SCD Code Status: Full code Family Communication:  Yes, patient's son at bed side Disposition Plan:  Anticipate discharge back to previous home environment Consults called: none Admission status: Obs / tele     Date of Service 11/06/2017    Ivor Costa [facility] Pager [phone]  If 7PM-7AM, please contact night-coverage [email or web address] Password [facility] 11/06/2017, 11:24 PM

## 2017-11-07 ENCOUNTER — Inpatient Hospital Stay (HOSPITAL_COMMUNITY): Payer: Medicare Other

## 2017-11-07 ENCOUNTER — Inpatient Hospital Stay (HOSPITAL_COMMUNITY): Admission: RE | Admit: 2017-11-07 | Payer: Medicaid Other | Source: Ambulatory Visit

## 2017-11-07 ENCOUNTER — Other Ambulatory Visit: Payer: Self-pay

## 2017-11-07 ENCOUNTER — Encounter (HOSPITAL_COMMUNITY): Payer: Self-pay | Admitting: Nephrology

## 2017-11-07 DIAGNOSIS — E1122 Type 2 diabetes mellitus with diabetic chronic kidney disease: Secondary | ICD-10-CM | POA: Diagnosis not present

## 2017-11-07 DIAGNOSIS — D631 Anemia in chronic kidney disease: Secondary | ICD-10-CM | POA: Diagnosis not present

## 2017-11-07 DIAGNOSIS — I5033 Acute on chronic diastolic (congestive) heart failure: Secondary | ICD-10-CM | POA: Diagnosis not present

## 2017-11-07 DIAGNOSIS — J9601 Acute respiratory failure with hypoxia: Secondary | ICD-10-CM

## 2017-11-07 DIAGNOSIS — I130 Hypertensive heart and chronic kidney disease with heart failure and stage 1 through stage 4 chronic kidney disease, or unspecified chronic kidney disease: Secondary | ICD-10-CM | POA: Diagnosis not present

## 2017-11-07 DIAGNOSIS — E11319 Type 2 diabetes mellitus with unspecified diabetic retinopathy without macular edema: Secondary | ICD-10-CM | POA: Diagnosis not present

## 2017-11-07 DIAGNOSIS — J9621 Acute and chronic respiratory failure with hypoxia: Secondary | ICD-10-CM | POA: Diagnosis not present

## 2017-11-07 DIAGNOSIS — N184 Chronic kidney disease, stage 4 (severe): Secondary | ICD-10-CM | POA: Diagnosis not present

## 2017-11-07 DIAGNOSIS — H6090 Unspecified otitis externa, unspecified ear: Secondary | ICD-10-CM | POA: Diagnosis not present

## 2017-11-07 DIAGNOSIS — Z794 Long term (current) use of insulin: Secondary | ICD-10-CM | POA: Diagnosis not present

## 2017-11-07 DIAGNOSIS — D61818 Other pancytopenia: Secondary | ICD-10-CM | POA: Diagnosis not present

## 2017-11-07 DIAGNOSIS — Z823 Family history of stroke: Secondary | ICD-10-CM | POA: Diagnosis not present

## 2017-11-07 DIAGNOSIS — I129 Hypertensive chronic kidney disease with stage 1 through stage 4 chronic kidney disease, or unspecified chronic kidney disease: Secondary | ICD-10-CM | POA: Diagnosis not present

## 2017-11-07 DIAGNOSIS — N179 Acute kidney failure, unspecified: Secondary | ICD-10-CM | POA: Diagnosis present

## 2017-11-07 DIAGNOSIS — R042 Hemoptysis: Secondary | ICD-10-CM | POA: Diagnosis not present

## 2017-11-07 DIAGNOSIS — B659 Schistosomiasis, unspecified: Secondary | ICD-10-CM | POA: Diagnosis not present

## 2017-11-07 DIAGNOSIS — Z833 Family history of diabetes mellitus: Secondary | ICD-10-CM | POA: Diagnosis not present

## 2017-11-07 DIAGNOSIS — Z23 Encounter for immunization: Secondary | ICD-10-CM | POA: Diagnosis not present

## 2017-11-07 DIAGNOSIS — K7460 Unspecified cirrhosis of liver: Secondary | ICD-10-CM | POA: Diagnosis not present

## 2017-11-07 DIAGNOSIS — M310 Hypersensitivity angiitis: Secondary | ICD-10-CM | POA: Diagnosis not present

## 2017-11-07 DIAGNOSIS — K219 Gastro-esophageal reflux disease without esophagitis: Secondary | ICD-10-CM | POA: Diagnosis not present

## 2017-11-07 DIAGNOSIS — E872 Acidosis: Secondary | ICD-10-CM | POA: Diagnosis not present

## 2017-11-07 DIAGNOSIS — E875 Hyperkalemia: Secondary | ICD-10-CM | POA: Diagnosis not present

## 2017-11-07 DIAGNOSIS — R05 Cough: Secondary | ICD-10-CM | POA: Diagnosis present

## 2017-11-07 LAB — BASIC METABOLIC PANEL
Anion gap: 13 (ref 5–15)
BUN: 107 mg/dL — AB (ref 6–20)
CO2: 17 mmol/L — ABNORMAL LOW (ref 22–32)
Calcium: 7.8 mg/dL — ABNORMAL LOW (ref 8.9–10.3)
Chloride: 103 mmol/L (ref 101–111)
Creatinine, Ser: 5.57 mg/dL — ABNORMAL HIGH (ref 0.61–1.24)
GFR calc Af Amer: 11 mL/min — ABNORMAL LOW (ref >60)
GFR, EST NON AFRICAN AMERICAN: 10 mL/min — AB (ref >60)
GLUCOSE: 212 mg/dL — AB (ref 65–99)
POTASSIUM: 5 mmol/L (ref 3.5–5.1)
Sodium: 133 mmol/L — ABNORMAL LOW (ref 135–145)

## 2017-11-07 LAB — APTT: aPTT: 39 seconds — ABNORMAL HIGH (ref 24–36)

## 2017-11-07 LAB — CREATININE, URINE, RANDOM: CREATININE, URINE: 44.58 mg/dL

## 2017-11-07 LAB — GLUCOSE, CAPILLARY
GLUCOSE-CAPILLARY: 252 mg/dL — AB (ref 65–99)
GLUCOSE-CAPILLARY: 141 mg/dL — AB (ref 65–99)
Glucose-Capillary: 175 mg/dL — ABNORMAL HIGH (ref 65–99)
Glucose-Capillary: 191 mg/dL — ABNORMAL HIGH (ref 65–99)
Glucose-Capillary: 86 mg/dL (ref 65–99)

## 2017-11-07 LAB — SODIUM, URINE, RANDOM: Sodium, Ur: 69 mmol/L

## 2017-11-07 LAB — SEDIMENTATION RATE: Sed Rate: 70 mm/hr — ABNORMAL HIGH (ref 0–16)

## 2017-11-07 LAB — PROTIME-INR
INR: 1.26
Prothrombin Time: 15.6 seconds — ABNORMAL HIGH (ref 11.4–15.2)

## 2017-11-07 MED ORDER — AMOXICILLIN 500 MG PO CAPS
500.0000 mg | ORAL_CAPSULE | Freq: Two times a day (BID) | ORAL | Status: DC
  Administered 2017-11-07: 500 mg via ORAL
  Filled 2017-11-07 (×2): qty 1

## 2017-11-07 MED ORDER — PNEUMOCOCCAL VAC POLYVALENT 25 MCG/0.5ML IJ INJ
0.5000 mL | INJECTION | INTRAMUSCULAR | Status: AC
  Administered 2017-11-08: 0.5 mL via INTRAMUSCULAR
  Filled 2017-11-07: qty 0.5

## 2017-11-07 MED ORDER — FUROSEMIDE 10 MG/ML IJ SOLN
40.0000 mg | Freq: Once | INTRAMUSCULAR | Status: AC
  Administered 2017-11-07: 40 mg via INTRAVENOUS
  Filled 2017-11-07: qty 4

## 2017-11-07 NOTE — Consult Note (Signed)
Reason for Consult: AKI/CKD stage 4 Referring Physician:  Sarajane Jews, MD  William [name] is an 67 y.o. male. Patient does not speak English so history was obtained through his son. HPI: William [name] is a 69 year old man of [place] origin with past medical history significant for diabetes mellitus with associated retinopathy for the past 25 years, long-standing hypertension, cirrhosis of unclear etiology (diagnosed with CT scan), pancytopenia, anemia of CKD who presented to [facility] ED with hemoptysis and cough.  William [name] was referred to our practice in January 2019 for evaluation of rapidly rising creatinine.  His serum creatinine was 0.9-1.2 between 2003 and 2017, however, when checked in November, 2018 creatinine had shot up to 3.0. At the same time, he was also noted to have an elevated urine protein/creatinine ratio, evidence of anemia with a hemoglobin of 9.2 and a hemoglobin A1c of 8.1%.  He is followed in our office by Dr. Posey Pronto whose previous workup was negative for obstructive neuropathy (but did have small echogenic kidneys), did not show evidence of hepatitis for acute GN (but he does have a mildly positive anti-GBM antibody 27 with 20 being upper limits of normal), no evidence of plasma cell dyscrasia (4% plasma cells on BM bx) and without evidence of ANCA vasculitis. His antinuclear antibody and complement levels also within normal limits.  We were consulted to help further evaluate and manage his AKI/CKD vs. Rapidly progressive CKD.  The trend in creatinine levels is seen below.  From his office visits with Dr. Posey Pronto he denied any prior history of acute kidney injury, recurrent nephrolithiasis or hematuria but a son reports that he has had "urinary tract infections many times in [place]. He also has suffered schistosomiasis in the past that was treated back in [place] several years ago. He denies any recurrent vomiting but has occasional nausea and diarrhea. Over the past few months since his return from  [place] after a prolonged trip there, he had been experiencing some increasing leg swelling, cough, sputum production with hemoptysis, nose bleeding, shortness of breath with exertion as well as shortness of breath and laying flat. He denies any prior history of cardiac disease. He denies any newly emerging skin rash or focal joint swelling or stiffness. His son reports that he occasional uses nonsteroidal anti-inflammatory drugs.  He was also seen by Pulmonology for his hemoptysis and they suspected CHF, however ECHO revealed normal EF.   Trend in Creatinine: Creatinine, Ser  Date/Time Value Ref Range Status  11/07/2017 04:49 AM 5.57 (H) 0.61 - 1.24 mg/dL Final  11/06/2017 05:00 PM 5.47 (H) 0.61 - 1.24 mg/dL Final  10/14/2017 4.44 0.76-1.27   09/15/2017 08:56 AM 3.53 (HH) 0.70 - 1.30 mg/dL Final  09/03/2017 12:55 PM 3.54 (HH) 0.70 - 1.30 mg/dL Final  06/20/2017 04:13 PM 3.01 (H) 0.76 - 1.27 mg/dL Final  08/31/2015 08:59 AM 1.23 0.70 - 1.25 mg/dL Final  01/27/2013 07:44 PM 1.20 0.50 - 1.35 mg/dL Final  06/25/2012 05:23 PM 0.98 0.50 - 1.35 mg/dL Final    PMH:   Past Medical History:  Diagnosis Date  . Cirrhosis ([place])   . CKD (chronic kidney disease), stage IV ([place])   . Diabetes mellitus without complication ([place])   . Grade I diastolic dysfunction 01/60/1093  . History of anemia due to chronic kidney disease   . Hypertension   . Pancytopenia ([place]) 08/20/2017    PSH:   Past Surgical History:  Procedure Laterality Date  . EYE SURGERY Bilateral    Cataract  . NECK  SURGERY      Allergies: No Known Allergies  Medications:   Prior to Admission medications   Medication Sig Start Date End Date Taking? Authorizing Provider  amLODipine (NORVASC) 10 MG tablet Take 1 tablet (10 mg total) daily by mouth. 06/20/17 11/06/24 Yes Gildardo Pounds, NP  amoxicillin (AMOXIL) 875 MG tablet Take 875 mg by mouth 2 (two) times daily. 10/31/17  Yes [provider]  carvedilol (COREG) 12.5 MG  tablet Take 12.5 mg by mouth 2 (two) times daily with a meal.   Yes [provider]  ferrous gluconate (IRON 27) 240 (27 FE) MG tablet Take 1 tablet by mouth daily.   Yes [provider]  furosemide (LASIX) 20 MG tablet Take 40 mg by mouth daily.    Yes [provider]  insulin NPH-insulin regular (NOVOLIN 70/30) (70-30) 100 UNIT/ML injection Inject 10-30 Units into the skin See admin instructions. 30 units in am and 10 units at night    Yes [provider]  Multiple Vitamins-Minerals (MULTIVITAMIN WITH MINERALS) tablet Take 1 tablet by mouth daily.   Yes [provider]  neomycin-polymyxin-hydrocortisone (CORTISPORIN) 3.5-10000-1 OTIC suspension Place 4 drops into the right ear 3 (three) times daily. 11/03/17  Yes [provider]  pantoprazole (PROTONIX) 40 MG tablet Take 1 tablet (40 mg total) by mouth daily. Take 30-60 min before first meal of the day 10/13/17  Yes Tanda Rockers, MD    Inpatient medications: . amLODipine  10 mg Oral Daily  . amoxicillin  500 mg Oral Q12H  . carvedilol  12.5 mg Oral BID WC  . ferrous gluconate  324 mg Oral Daily  . insulin aspart  0-9 Units Subcutaneous TID WC  . insulin aspart protamine- aspart  20 Units Subcutaneous Q breakfast  . insulin aspart protamine- aspart  7 Units Subcutaneous Q supper  . multivitamin with minerals  1 tablet Oral Daily  . neomycin-polymyxin-hydrocortisone  4 drop Right EAR TID  . pantoprazole  40 mg Oral Daily  . [START ON 11/08/2017] pneumococcal 23 valent vaccine  0.5 mL Intramuscular Tomorrow-1000  . sodium chloride flush  3 mL Intravenous Q12H    Discontinued Meds:   Medications Discontinued During This Encounter  Medication Reason  . Polyethyl Glycol-Propyl Glycol (SYSTANE) 0.4-0.3 % SOLN Discontinued by provider  . famotidine (PEPCID) 20 MG tablet Discontinued by provider  . amoxicillin (AMOXIL) 250 MG/5ML suspension 875 mg   . furosemide (LASIX) tablet 40 mg      Social History:  reports that he has never smoked. He has never used smokeless tobacco. He reports that he has current or past drug history. He reports that he does not drink alcohol.  Family History:   Family History  Problem Relation Age of Onset  . Diabetes Mellitus II Sister   . Stroke Brother     Pertinent items are noted in HPI. Weight change:   Intake/Output Summary (Last 24 hours) at 11/07/2017 1518 Last data filed at 11/07/2017 1354 Gross per 24 hour  Intake 3 ml  Output 775 ml  Net -772 ml   BP (!) 135/58 (BP Location: Left Arm)   Pulse 79   Temp 98.9 F (37.2 C) (Oral)   Resp (!) 21   Ht 5' 5"  (1.651 m)   Wt 65.4 kg (144 lb 2.9 oz)   SpO2 92%   BMI 23.99 kg/m  Vitals:   11/07/17 0100 11/07/17 0530 11/07/17 0745 11/07/17 1156  BP:  (!) 142/67 (!) 152/66 Marland Kitchen)  135/58  Pulse:  83 83 79  Resp:  (!) 24 18 (!) 21  Temp:  98.2 F (36.8 C) 98.8 F (37.1 C) 98.9 F (37.2 C)  TempSrc:  Oral Oral Oral  SpO2:  (!) 89% 91% 92%  Weight: 65.4 kg (144 lb 2.9 oz)     Height: 5' 5"  (1.651 m)        General appearance: alert, cooperative and no distress Head: Normocephalic, without obvious abnormality, atraumatic Neck: no adenopathy, no carotid bruit, no JVD, supple, symmetrical, trachea midline and thyroid not enlarged, symmetric, no tenderness/mass/nodules Resp: rales bibasilar Cardio: regular rate and rhythm, S1, S2 normal, no murmur, click, rub or gallop GI: soft, non-tender; bowel sounds normal; no masses,  no organomegaly Extremities: edema 1+  Labs: Basic Metabolic Panel: Recent Labs  Lab 11/06/17 1700 11/07/17 0449  NA 135 133*  K 4.6 5.0  CL 106 103  CO2 17* 17*  GLUCOSE 117* 212*  BUN 105* 107*  CREATININE 5.47* 5.57*  CALCIUM 8.1* 7.8*   Liver Function Tests: No results for input(s): AST, ALT, ALKPHOS, BILITOT, PROT, ALBUMIN in the last 168 hours. No results for input(s): LIPASE, AMYLASE in the last 168 hours. No results for input(s): AMMONIA in  the last 168 hours. CBC: Recent Labs  Lab 11/06/17 1700  WBC 3.5*  NEUTROABS 2.4  HGB 8.0*  HCT 25.3*  MCV 74.0*  PLT 66*   PT/INR: @LABRCNTIP (inr:5) Cardiac Enzymes: )No results for input(s): CKTOTAL, CKMB, CKMBINDEX, TROPONINI in the last 168 hours. CBG: Recent Labs  Lab 11/07/17 0052 11/07/17 0746 11/07/17 1155  GLUCAP 175* 252* 191*    Iron Studies: No results for input(s): IRON, TIBC, TRANSFERRIN, FERRITIN in the last 168 hours.  Xrays/Other Studies: Dg Chest 2 View  Result Date: 11/06/2017 CLINICAL DATA:  Cough, coughing up blood EXAM: CHEST - 2 VIEW COMPARISON:  09/18/2017 FINDINGS: Diffuse bilateral interstitial and alveolar airspace opacities. No pleural effusion or pneumothorax. Stable cardiomediastinal silhouette. No aggressive osseous lesion. IMPRESSION: Bilateral interstitial and alveolar airspace opacities. Differential diagnosis includes pulmonary edema versus multi lobar pneumonia. Electronically Signed   By: Kathreen Devoid   On: 11/06/2017 18:35     Assessment/Plan: 1.  AKI/CKD vs RPGN- pt's creatinine has rapidly risen over the last 2 years and he presented with lower extremity edema and ongoing hemoptysis for the several months with mildly positive anti-GBM Ab.  Unfortunately he has significant thrombocytopenia so renal biopsy is not possible.  I discussed the case with Dr. Sarajane Jews and will consult pulmonology to re-evaluate him.  Will recheck anti-GBM Ab.  Unfortunately he is rapidly approaching dialysis and may require initiation if his renal function continues to deteriorate.  Not sure we will improve renal function given small echogenic kidneys on [place] but may benefit from plasmapheresis if they think the hemoptysis is due to anti-GBM disease. 2. Hemoptysis- as above.  Consult Pulmonology.  ECHO with preserved EF so CHF unlikely.  Possible infection vs vasculitis. 3. Pancytopenia- seen by Hematology/Oncology with negative BM biopsy (4% plasma  cells) 4. Cirrhosis- unclear etiology, seen on CT scan, negative hepatitis screen.   5. Anemia of CKD stage 4- on epo 10,000 units sq every 2 weeks.  Will resume while he is in the hospital with aranesp as he was due for injection today. 6. HTN- stable 7. DM per primary   [place] 11/07/2017, 3:18 PM

## 2017-11-07 NOTE — Consult Note (Signed)
PULMONARY / CRITICAL CARE MEDICINE   Name: William [name] MRN: [id number] DOB: April 14, 1951    ADMISSION DATE:  11/06/2017 CONSULTATION DATE:  11/07/17  REFERRING MD:  Murray Hodgkins, MD  CHIEF COMPLAINT:  Cough and hemoptysis   HISTORY OF PRESENT ILLNESS:   Patient is a 67 y.o male with HTN, DM, HFpEF, Cirrhosis, Pancytopenia, and CKD stage 4 who presented to the ED on 4/04 with cough and hemoptysis that has gotten progressively worse over the past several years. He has been following up with nephrology as an outpatient for rapidly elevating creatinine. He also follows with pulmonology for this chronic cough and hemoptysis. We were consulted for further evaluation and management of the patient's hemoptysis.   PAST MEDICAL HISTORY :  He  has a past medical history of Cirrhosis ([place]), CKD (chronic kidney disease), stage IV ([place]), Diabetes mellitus without complication ([place]), Grade I diastolic dysfunction (28/76/8115), History of anemia due to chronic kidney disease, Hypertension, and Pancytopenia ([place]) (08/20/2017).  PAST SURGICAL HISTORY: He  has a past surgical history that includes Neck surgery and Eye surgery (Bilateral).  No Known Allergies  No current facility-administered medications on file prior to encounter.    Current Outpatient Medications on File Prior to Encounter  Medication Sig  . amLODipine (NORVASC) 10 MG tablet Take 1 tablet (10 mg total) daily by mouth.  Marland Kitchen amoxicillin (AMOXIL) 875 MG tablet Take 875 mg by mouth 2 (two) times daily.  . carvedilol (COREG) 12.5 MG tablet Take 12.5 mg by mouth 2 (two) times daily with a meal.  . ferrous gluconate (IRON 27) 240 (27 FE) MG tablet Take 1 tablet by mouth daily.  . furosemide (LASIX) 20 MG tablet Take 40 mg by mouth daily.   . insulin NPH-insulin regular (NOVOLIN 70/30) (70-30) 100 UNIT/ML injection Inject 10-30 Units into the skin See admin instructions. 30 units in am and 10 units at night   . Multiple Vitamins-Minerals  (MULTIVITAMIN WITH MINERALS) tablet Take 1 tablet by mouth daily.  Marland Kitchen neomycin-polymyxin-hydrocortisone (CORTISPORIN) 3.5-10000-1 OTIC suspension Place 4 drops into the right ear 3 (three) times daily.  . pantoprazole (PROTONIX) 40 MG tablet Take 1 tablet (40 mg total) by mouth daily. Take 30-60 min before first meal of the day   FAMILY HISTORY:  His indicated that the status of his sister is unknown. He indicated that the status of his brother is unknown.  SOCIAL HISTORY: He  reports that he has never smoked. He has never used smokeless tobacco. He reports that he has current or past drug history. He reports that he does not drink alcohol.  REVIEW OF SYSTEMS:   12 point ROS preformed. All negative aside from those mentioned in the HPI.  SUBJECTIVE:  History obtained via translator. Patient initially had hemoptysis several months ago that spontaneously resolved. It then recurred prompting further evaluation. The patient's son also states that the patient had an ear infection recently and was prescribed antibiotics at the urgent care. Since that time he has been coughing more and not felt like himself.   VITAL SIGNS: BP (!) 135/58 (BP Location: Left Arm)   Pulse 79   Temp 98.9 F (37.2 C) (Oral)   Resp (!) 21   Ht 5' 5"  (1.651 m)   Wt 144 lb 2.9 oz (65.4 kg)   SpO2 92%   BMI 23.99 kg/m   INTAKE / OUTPUT: I/O last 3 completed shifts: In: -  Out: 550 [Urine:550]  PHYSICAL EXAMINATION:  General: Well nourished male in no  acute distress Neuro: Alert and oriented x 3 HEENT: Normocephalic, atraumatic, moist mucus membranes Cardiovascular: RRR, no murmurs, no rubs  Lungs: Good air movement with diffuse inspiratory crackles  Abdomen: Active bowel sounds, soft, non-distended Musculoskeletal: No LE edema  Skin: Warm and dry   LABS: BMET Recent Labs  Lab 11/06/17 1700 11/07/17 0449  NA 135 133*  K 4.6 5.0  CL 106 103  CO2 17* 17*  BUN 105* 107*  CREATININE 5.47* 5.57*   GLUCOSE 117* 212*   Electrolytes Recent Labs  Lab 11/06/17 1700 11/07/17 0449  CALCIUM 8.1* 7.8*   CBC Recent Labs  Lab 11/06/17 1700  WBC 3.5*  HGB 8.0*  HCT 25.3*  PLT 66*   Coag's Recent Labs  Lab 11/07/17 1547  APTT 39*  INR 1.26   Sepsis Markers No results for input(s): LATICACIDVEN, PROCALCITON, O2SATVEN in the last 168 hours.  ABG No results for input(s): PHART, PCO2ART, PO2ART in the last 168 hours.  Liver Enzymes No results for input(s): AST, ALT, ALKPHOS, BILITOT, ALBUMIN in the last 168 hours.  Cardiac Enzymes No results for input(s): TROPONINI, PROBNP in the last 168 hours.  Glucose Recent Labs  Lab 11/07/17 0052 11/07/17 0746 11/07/17 1155 11/07/17 1614  GLUCAP 175* 252* 191* 86   Imaging Dg Chest 2 View  Result Date: 11/06/2017 CLINICAL DATA:  Cough, coughing up blood EXAM: CHEST - 2 VIEW COMPARISON:  09/18/2017 FINDINGS: Diffuse bilateral interstitial and alveolar airspace opacities. No pleural effusion or pneumothorax. Stable cardiomediastinal silhouette. No aggressive osseous lesion. IMPRESSION: Bilateral interstitial and alveolar airspace opacities. Differential diagnosis includes pulmonary edema versus multi lobar pneumonia. Electronically Signed   By: Kathreen Devoid   On: 11/06/2017 18:35   ASSESSMENT / PLAN: Patient is a 67 y.o male with HTN, DM, HFpEF, Cirrhosis, Pancytopenia, and CKD stage 4 who presented to the ED on 4/04 with cough and hemoptysis that has gotten progressively worse over the past several years. Work-up is significant for bilateral ground glass opacities with associated volume loss of the left lobe and rapidly rising creatinine.   Acute Hypoxic Respiratory Failure Diffuse Ground Glass Opacities RPGN  - Patient has had extensive work-up over the past several months. Including: Protein to creatinine ratio >4000, negative ANA, negative ANCA, negative hepatitis B and C, negative HIV, negative bone marrow biopsy (4% plast  cells), and renal ultrasound showing increased echogenicity.  - Patient does have a positive Anti-GBM  - Although previous ANCA was negative, 30% of ANCA associated vasculitis are ANCA negative. Would ultimately need tissue for the diagnosis. We will check an ANCA, ANA, RF, and anti-CCP.  - Would also check schistosomiasis serology (history of infection) as this could explain the patient's renal, hepatic, pancytopenia, and pulmonary involvement.  - CT chest for further evaluation.  - Ultimately the patient needs a renal biopsy to confirm the diagnosis. Bronch would only help to determine if the patient was having alveolar hemorrhage vs infection. Would recommend platelet transfusion and pursuing renal biopsy.  - Pending work-up includes complement levels, Anti-GBM, immunofixation  Rest per primary.   Pulmonary and [place] Pager: [phone]  11/07/2017, 4:44 PM

## 2017-11-07 NOTE — Progress Notes (Signed)
Patient son William [name] at bedside to help with interpretation.Patient speaks arabic.This nurse called interpreter line but was told patient had different dialect.Patient and son oriented to nursing unit and call bell system. No acute distress noted at present time will continue to monitor.

## 2017-11-07 NOTE — Progress Notes (Signed)
Inpatient Diabetes Program Recommendations  AACE/ADA: New Consensus Statement on Inpatient Glycemic Control (2015)  Target Ranges:  Prepandial:   less than 140 mg/dL      Peak postprandial:   less than 180 mg/dL (1-2 hours)      Critically ill patients:  140 - 180 mg/dL   Lab Results  Component Value Date   GLUCAP 252 (H) 11/07/2017   HGBA1C 8.1 06/20/2017   Review of Glycemic Control  Diabetes history: DM 2 Outpatient Diabetes medications: 70/30 30 units qam, 10 units qpm,  Current orders for Inpatient glycemic control:  70/30 20 units qam, 7 units qpm, Novolog Sensitive Correction 0-9 units tid  Inpatient Diabetes Program Recommendations:    Consider an updated A1c level to determine glucose control over the past 2-3 months.  Noted patient received first dose of 70/30 along with Novolog Correction this am. Will watch trends.  Thanks,  Tama Headings RN, MSN, BC-ADM, [facility] Inpatient Diabetes Coordinator Team Pager [phone] (8a-5p)

## 2017-11-07 NOTE — Progress Notes (Signed)
PROGRESS NOTE  William [name] UUV:[id number] DOB: 07-Nov-1950 DOA: 11/06/2017 PCP: Gildardo Pounds, NP Dr. Melvyn Novas  Dr. Johnsie Cancel Dr. Posey Pronto Dr. Julien Nordmann  Brief Narrative: 67 year old man PMH chronic kidney disease stage IV, cirrhosis, pancytopenia, diabetes mellitus, diastolic heart failure, presented with chronic cough, mild shortness of breath, edema.  Has had extensive workup by pulmonology in the outpatient setting, admitted for acute on chronic hypoxic respiratory failure, acute on chronic diastolic heart failure, acute kidney injury superimposed on CKD stage IV.  Assessment/Plan AKI superimposed on CKD stage IV --Appreciate nephrology management.  Discussed in detail with Dr. Marval Regal.  Acute hypoxic resp failure.  Workup by pulmonology in the outpatient setting, diagnosis unclear, suspected multifactorial including sinusitis, GERD, diastolic heart failure 11/7423.  CT chest February 2019 showed diffuse groundglass and airspace disease with concurrent cardiomegaly and bilateral pleural effusions. --Suspect secondary to pulmonary edema secondary to progressive renal failure. --Continue supplemental oxygen.  Nephrology consultation for assistance with volume management. --Discussed with nephrology, recommendation for pulmonology evaluation to assess for the possibility of [place]'s.  Streaky/low volume hemoptysis.  Hemoglobin stable. --Immunology evaluation as above  Acute on chronic diastolic congestive heart failure --Probably secondary to worsening renal disease.  Troponin negative. --Diuretics per nephrology  DM type 2 --Fair control.  Pancytopenia --- Stable.  Has been previously evaluated by Dr. Earlie Server in the outpatient setting.   Family Meeting Note   Today a meeting took place with the Patient and son.   The following clinical team members were present during this meeting: MD, bedside interpreter   The following were discussed: Laboratory and x-ray findings, exam  findings, recapitulation of history, current assessment and plan, nephrology consultation. Patient's prognosis:  Unclear at this point   Time spent during discussion: 10 minutes    DVT prophylaxis: SCDs Code Status: full Family Communication:  Disposition Plan: This patient has worsening renal failure complicated by multiple comorbidities including diastolic heart failure and diabetes.  His hospitalization is anticipated to be several days and patient qualifies for inpatient status.   Murray Hodgkins, MD  [facility] Direct contact: [phone] --Via amion app OR  --[email or web address]; password [email or web address]  7PM-7AM contact night coverage as above 11/07/2017, 8:29 AM  LOS: 0 days   Consultants:  Nephrology  Pulmonology  Procedures:    Antimicrobials:    Interval history/Subjective: History, exam and discussion of plan via bedside physically present interpreter provided by hospital.  Patient reports cough for several weeks, shortness of breath and fatigue.  Son at bedside confirms history.  Reports cough with hemoptysis for several weeks.  Objective: Vitals:  Vitals:   11/07/17 0530 11/07/17 0745  BP: (!) 142/67 (!) 152/66  Pulse: 83 83  Resp: (!) 24 18  Temp: 98.2 F (36.8 C) 98.8 F (37.1 C)  SpO2: (!) 89% 91%    Exam:  Constitutional:  . Appears calm and comfortable lying in bed Eyes:  . pupils and irises appear normal . Normal lids  ENMT:  . grossly normal hearing  . Lips appear normal . No blood in mouth Respiratory:  . CTA anteriorly.  Posterior crackles both lungs.  No rhonchi or wheezes. Marland Kitchen Respiratory effort normal.  Cardiovascular:  . RRR, no m/r/g Telemetry sinus rhythm . No LE extremity edema   Abdomen:  . Abdomen appears normal; no tenderness or masses Musculoskeletal:  . Digits/nails BUE: no clubbing, cyanosis, petechiae, infection . RLE, LLE   o strength and tone normal, no atrophy, no abnormal movements Skin:  . No rashes, lesions,  ulcers  Psychiatric:  . Mental status o Mood, affect appropriate   I have personally reviewed the following:   Labs:  Blood sugar stable  Potassium 5.0, CO2 17, BUN 107, creatinine 5.57  INR 1.26  Imaging studies:  Chest x-ray has appearance of CHF  Scheduled Meds: . amLODipine  10 mg Oral Daily  . amoxicillin  875 mg Oral BID  . carvedilol  12.5 mg Oral BID WC  . ferrous gluconate  324 mg Oral Daily  . furosemide  40 mg Oral Daily  . insulin aspart  0-9 Units Subcutaneous TID WC  . insulin aspart protamine- aspart  20 Units Subcutaneous Q breakfast  . insulin aspart protamine- aspart  7 Units Subcutaneous Q supper  . multivitamin with minerals  1 tablet Oral Daily  . neomycin-polymyxin-hydrocortisone  4 drop Right EAR TID  . pantoprazole  40 mg Oral Daily  . [START ON 11/08/2017] pneumococcal 23 valent vaccine  0.5 mL Intramuscular Tomorrow-1000  . sodium chloride flush  3 mL Intravenous Q12H   Continuous Infusions: . sodium chloride      Principal Problem:   Acute on chronic respiratory failure with hypoxia (HCC) Active Problems:   Type II diabetes mellitus with renal manifestations (HCC)   Acute on chronic diastolic CHF (congestive heart failure) (HCC)   Pancytopenia (HCC)   Acute renal failure superimposed on stage 4 chronic kidney disease (HCC)   GERD (gastroesophageal reflux disease)   Essential hypertension   LOS: 0 days

## 2017-11-08 DIAGNOSIS — I5033 Acute on chronic diastolic (congestive) heart failure: Secondary | ICD-10-CM | POA: Diagnosis not present

## 2017-11-08 DIAGNOSIS — I130 Hypertensive heart and chronic kidney disease with heart failure and stage 1 through stage 4 chronic kidney disease, or unspecified chronic kidney disease: Secondary | ICD-10-CM | POA: Diagnosis not present

## 2017-11-08 DIAGNOSIS — E1122 Type 2 diabetes mellitus with diabetic chronic kidney disease: Secondary | ICD-10-CM | POA: Diagnosis not present

## 2017-11-08 DIAGNOSIS — N184 Chronic kidney disease, stage 4 (severe): Secondary | ICD-10-CM | POA: Diagnosis not present

## 2017-11-08 DIAGNOSIS — I509 Heart failure, unspecified: Secondary | ICD-10-CM | POA: Diagnosis not present

## 2017-11-08 DIAGNOSIS — J9621 Acute and chronic respiratory failure with hypoxia: Secondary | ICD-10-CM | POA: Diagnosis not present

## 2017-11-08 DIAGNOSIS — I129 Hypertensive chronic kidney disease with stage 1 through stage 4 chronic kidney disease, or unspecified chronic kidney disease: Secondary | ICD-10-CM | POA: Diagnosis not present

## 2017-11-08 DIAGNOSIS — D61818 Other pancytopenia: Secondary | ICD-10-CM | POA: Diagnosis not present

## 2017-11-08 DIAGNOSIS — R05 Cough: Secondary | ICD-10-CM | POA: Diagnosis not present

## 2017-11-08 DIAGNOSIS — Z23 Encounter for immunization: Secondary | ICD-10-CM | POA: Diagnosis not present

## 2017-11-08 DIAGNOSIS — J9601 Acute respiratory failure with hypoxia: Secondary | ICD-10-CM | POA: Diagnosis not present

## 2017-11-08 DIAGNOSIS — N179 Acute kidney failure, unspecified: Secondary | ICD-10-CM | POA: Diagnosis not present

## 2017-11-08 DIAGNOSIS — Z794 Long term (current) use of insulin: Secondary | ICD-10-CM | POA: Diagnosis not present

## 2017-11-08 DIAGNOSIS — R042 Hemoptysis: Secondary | ICD-10-CM | POA: Diagnosis not present

## 2017-11-08 LAB — RENAL FUNCTION PANEL
ALBUMIN: 2.4 g/dL — AB (ref 3.5–5.0)
Anion gap: 14 (ref 5–15)
BUN: 117 mg/dL — ABNORMAL HIGH (ref 6–20)
CO2: 16 mmol/L — ABNORMAL LOW (ref 22–32)
Calcium: 7.6 mg/dL — ABNORMAL LOW (ref 8.9–10.3)
Chloride: 105 mmol/L (ref 101–111)
Creatinine, Ser: 5.66 mg/dL — ABNORMAL HIGH (ref 0.61–1.24)
GFR calc Af Amer: 11 mL/min — ABNORMAL LOW (ref >60)
GFR calc non Af Amer: 9 mL/min — ABNORMAL LOW (ref >60)
GLUCOSE: 258 mg/dL — AB (ref 65–99)
PHOSPHORUS: 6.1 mg/dL — AB (ref 2.5–4.6)
POTASSIUM: 4.9 mmol/L (ref 3.5–5.1)
Sodium: 135 mmol/L (ref 135–145)

## 2017-11-08 LAB — RHEUMATOID FACTOR: Rhuematoid fact SerPl-aCnc: 12.5 IU/mL (ref 0.0–13.9)

## 2017-11-08 LAB — CBC
HCT: 24 % — ABNORMAL LOW (ref 39.0–52.0)
Hemoglobin: 7.6 g/dL — ABNORMAL LOW (ref 13.0–17.0)
MCH: 23.2 pg — ABNORMAL LOW (ref 26.0–34.0)
MCHC: 31.7 g/dL (ref 30.0–36.0)
MCV: 73.4 fL — ABNORMAL LOW (ref 78.0–100.0)
PLATELETS: 59 10*3/uL — AB (ref 150–400)
RBC: 3.27 MIL/uL — ABNORMAL LOW (ref 4.22–5.81)
RDW: 14.8 % (ref 11.5–15.5)
WBC: 2.4 10*3/uL — AB (ref 4.0–10.5)

## 2017-11-08 LAB — MPO/PR-3 (ANCA) ANTIBODIES: ANCA Proteinase 3: <3.5 U/mL (ref 0.0–3.5)

## 2017-11-08 LAB — C4 COMPLEMENT: Complement C4, Body Fluid: 26 mg/dL (ref 14–44)

## 2017-11-08 LAB — C3 COMPLEMENT: C3 Complement: 104 mg/dL (ref 82–167)

## 2017-11-08 LAB — GLUCOSE, CAPILLARY
GLUCOSE-CAPILLARY: 256 mg/dL — AB (ref 65–99)
GLUCOSE-CAPILLARY: 56 mg/dL — AB (ref 65–99)
GLUCOSE-CAPILLARY: 73 mg/dL (ref 65–99)
Glucose-Capillary: 275 mg/dL — ABNORMAL HIGH (ref 65–99)
Glucose-Capillary: 117 mg/dL — ABNORMAL HIGH (ref 65–99)

## 2017-11-08 LAB — ANA W/REFLEX IF POSITIVE: ANA: NEGATIVE

## 2017-11-08 MED ORDER — CEFDINIR 300 MG PO CAPS
300.0000 mg | ORAL_CAPSULE | Freq: Every day | ORAL | Status: DC
  Filled 2017-11-08: qty 1

## 2017-11-08 MED ORDER — CEFDINIR 300 MG PO CAPS: 300.0000 mg | ORAL_CAPSULE | Freq: Every day | ORAL | Status: DC

## 2017-11-08 NOTE — Progress Notes (Signed)
Patient went to CT earlier this shift, son went along as interpreter.  Patient voiced no complaints of pain.  Safety and comfort measures maintained.  Call bell wthin reach.  Son remains present at bedside.

## 2017-11-08 NOTE — Progress Notes (Signed)
S: Feels better today O:BP (!) 135/58 (BP Location: Left Arm)   Pulse 83   Temp 98.4 F (36.9 C) (Oral)   Resp 16   Ht 5' 5"  (1.651 m)   Wt 62.9 kg (138 lb 10.7 oz)   SpO2 95%   BMI 23.08 kg/m   Intake/Output Summary (Last 24 hours) at 11/08/2017 1134 Last data filed at 11/08/2017 0609 Gross per 24 hour  Intake 239 ml  Output 475 ml  Net -236 ml   Intake/Output: I/O last 3 completed shifts: In: 242 [P.O.:236; I.V.:6] Out: 1025 [Urine:1025]  Intake/Output this shift:  No intake/output data recorded. Weight change: -0.871 kg (-1 lb 14.7 oz) Gen: NAD CVS: no rub Resp: bibasilar crackles Abd: +BS, soft, NT/ND Ext: no edema  Recent Labs  Lab 11/06/17 1700 11/07/17 0449 11/08/17 0310  NA 135 133* 135  K 4.6 5.0 4.9  CL 106 103 105  CO2 17* 17* 16*  GLUCOSE 117* 212* 258*  BUN 105* 107* 117*  CREATININE 5.47* 5.57* 5.66*  ALBUMIN  --   --  2.4*  CALCIUM 8.1* 7.8* 7.6*  PHOS  --   --  6.1*   Liver Function Tests: Recent Labs  Lab 11/08/17 0310  ALBUMIN 2.4*   No results for input(s): LIPASE, AMYLASE in the last 168 hours. No results for input(s): AMMONIA in the last 168 hours. CBC: Recent Labs  Lab 11/06/17 1700  WBC 3.5*  NEUTROABS 2.4  HGB 8.0*  HCT 25.3*  MCV 74.0*  PLT 66*   Cardiac Enzymes: No results for input(s): CKTOTAL, CKMB, CKMBINDEX, TROPONINI in the last 168 hours. CBG: Recent Labs  Lab 11/07/17 0746 11/07/17 1155 11/07/17 1614 11/07/17 2145 11/08/17 0835  GLUCAP 252* 191* 86 141* 275*    Iron Studies: No results for input(s): IRON, TIBC, TRANSFERRIN, FERRITIN in the last 72 hours. Studies/Results: Dg Chest 2 View  Result Date: 11/06/2017 CLINICAL DATA:  Cough, coughing up blood EXAM: CHEST - 2 VIEW COMPARISON:  09/18/2017 FINDINGS: Diffuse bilateral interstitial and alveolar airspace opacities. No pleural effusion or pneumothorax. Stable cardiomediastinal silhouette. No aggressive osseous lesion. IMPRESSION: Bilateral interstitial  and alveolar airspace opacities. Differential diagnosis includes pulmonary edema versus multi lobar pneumonia. Electronically Signed   By: Kathreen Devoid   On: 11/06/2017 18:35   . amLODipine  10 mg Oral Daily  . amoxicillin  500 mg Oral Q12H  . carvedilol  12.5 mg Oral BID WC  . ferrous gluconate  324 mg Oral Daily  . insulin aspart  0-9 Units Subcutaneous TID WC  . insulin aspart protamine- aspart  20 Units Subcutaneous Q breakfast  . insulin aspart protamine- aspart  7 Units Subcutaneous Q supper  . multivitamin with minerals  1 tablet Oral Daily  . neomycin-polymyxin-hydrocortisone  4 drop Right EAR TID  . pantoprazole  40 mg Oral Daily  . pneumococcal 23 valent vaccine  0.5 mL Intramuscular Tomorrow-1000  . sodium chloride flush  3 mL Intravenous Q12H    BMET    Component Value Date/Time   NA 135 11/08/2017 0310   NA 137 06/20/2017 1613   K 4.9 11/08/2017 0310   CL 105 11/08/2017 0310   CO2 16 (L) 11/08/2017 0310   GLUCOSE 258 (H) 11/08/2017 0310   BUN 117 (H) 11/08/2017 0310   BUN 62 (H) 06/20/2017 1613   CREATININE 5.66 (H) 11/08/2017 0310   CREATININE 3.53 (HH) 09/15/2017 0856   CREATININE 1.23 08/31/2015 0859   CALCIUM 7.6 (L) 11/08/2017 0310  GFRNONAA 9 (L) 11/08/2017 0310   GFRNONAA 17 (L) 09/15/2017 0856   GFRNONAA 62 08/31/2015 0859   GFRAA 11 (L) 11/08/2017 0310   GFRAA 19 (L) 09/15/2017 0856   GFRAA 71 08/31/2015 0859   CBC    Component Value Date/Time   WBC 3.5 (L) 11/06/2017 1700   RBC 3.42 (L) 11/06/2017 1700   HGB 8.0 (L) 11/06/2017 1700   HGB 9.2 (L) 06/20/2017 1613   HCT 25.3 (L) 11/06/2017 1700   HCT 29.7 (L) 06/20/2017 1613   PLT 66 (L) 11/06/2017 1700   PLT 68 (L) 10/01/2017 0814   PLT 101 (L) 06/20/2017 1613   MCV 74.0 (L) 11/06/2017 1700   MCV 76 (L) 06/20/2017 1613   MCH 23.4 (L) 11/06/2017 1700   MCHC 31.6 11/06/2017 1700   RDW 15.2 11/06/2017 1700   RDW 16.2 (H) 06/20/2017 1613   LYMPHSABS 0.7 11/06/2017 1700   MONOABS 0.4  11/06/2017 1700   EOSABS 0.1 11/06/2017 1700   BASOSABS 0.0 11/06/2017 1700    Assessment/Plan: 1.  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  Unfortunately he has significant thrombocytopenia so renal biopsy is not possible at this time. 1. I discussed the case with Drs. Goodrich and Mannam.   2. Recheck anti-GBM Ab in process.   3. Unfortunately he is rapidly approaching dialysis and may require initiation if his renal function continues to deteriorate.  Not sure we will improve renal function given small echogenic kidneys on [place] but may benefit from plasmapheresis if they think the hemoptysis is due to anti-GBM disease. 4. Would ask Heme to re-eval and weigh in on possible benefits of plasmapheresis 5. Only trace hematuria on UA and has underlying diabetic nephropathy so difficult to interpret. 6. Will likely need HD cath insertion in the next 24-48 hours if renal function continues to deteriorate. 2. Hemoptysis- as above.  Consulted Pulmonology.  ECHO with preserved EF so CHF unlikely.  Possible infection vs vasculitis/pulmonary-renal syndrome.  High resolution CT scan pending. 3. Pancytopenia- seen by Hematology/Oncology with negative BM biopsy (4% plasma cells) 4. Cirrhosis- unclear etiology, seen on CT scan, negative hepatitis screen.   5. Anemia of CKD stage 4- on epo 10,000 units sq every 2 weeks.  Will resume while he is in the hospital with aranesp as he was due for injection today. 6. HTN- stable 7. DM per primary   Donetta Potts, MD [facility] [phone]

## 2017-11-08 NOTE — Progress Notes (Signed)
PROGRESS NOTE    William [name]  JQZ:[id number] DOB: 26-Mar-1951 DOA: 11/06/2017 PCP: Gildardo Pounds, NP   Brief Narrative: William [name] is a 67 y.o. man PMH chronic kidney disease stage IV, cirrhosis, pancytopenia, diabetes mellitus, diastolic heart failure, presented with chronic cough, mild shortness of breath, edema.  Has had extensive workup by pulmonology in the outpatient setting, admitted for acute on chronic hypoxic respiratory failure, acute on chronic diastolic heart failure, acute kidney injury superimposed on CKD stage IV. Concern for Goodpasture's disease  Video interpreter used. Unable to get name and ID number  Assessment & Plan:   Principal Problem:   Acute renal failure superimposed on stage 4 chronic kidney disease (HCC) Active Problems:   Type II diabetes mellitus with renal manifestations (HCC)   Acute on chronic respiratory failure with hypoxia (HCC)   Acute on chronic diastolic CHF (congestive heart failure) (HCC)   Pancytopenia (HCC)   AKI (acute kidney injury) ([place])   Acute hypoxemic respiratory failure (HCC)   Acute renal failure on CKD 4 Worsening. Nephrology on board. Concern for growing need of HD. -Nephrology recommendations  Hemoptysis ?Goodpasture's. Pulmonology on board. High resolution CT scan performed, read still pending. Symptoms have improved from presentation. -Pulmonology recommendations -CBC  Acute respiratory failure with hypoxia Improved. Thought secondary to pulmonary edema in setting of fluid overload from renal failure vs acute diastolic heart failure  Pancytopenia Chronic issue. Followed by hematology as an outpatient  Diabetes mellitus, type 2 Poorly controlled. On 70/30 20 units daily -Watch insulin intake today -Continue Insulin 70/30 20 units -Continue SSI  Acute on chronic diastolic heart failure Appears to be improved today. -Nephrology recs in setting of worsening kidney function  Right ear infection Patient  declining antibiotic treatment. Symptoms resolved.    DVT prophylaxis: SCDs Code Status:   Code Status: Full Code Family Communication: Son at bedside Disposition Plan: Discharge pending kidney/lung workup   Consultants:   Pulmonology  Nephrology  Procedures:   None  Antimicrobials:  Amoxicillin (4/5)   Subjective: Feeling slightly better.  Objective: Vitals:   11/08/17 0025 11/08/17 0250 11/08/17 0604 11/08/17 0858  BP:   (!) 145/62 (!) 135/58  Pulse:   83   Resp:   16   Temp:   98.4 F (36.9 C)   TempSrc:   Oral   SpO2:   (!) 89% 95%  Weight: 64.9 kg (143 lb 1.3 oz) 62.9 kg (138 lb 10.7 oz)    Height:        Intake/Output Summary (Last 24 hours) at 11/08/2017 1252 Last data filed at 11/08/2017 0609 Gross per 24 hour  Intake 239 ml  Output 475 ml  Net -236 ml   Filed Weights   11/07/17 0100 11/08/17 0025 11/08/17 0250  Weight: 65.4 kg (144 lb 2.9 oz) 64.9 kg (143 lb 1.3 oz) 62.9 kg (138 lb 10.7 oz)    Examination:  General exam: Appears calm and comfortable Respiratory system: diffuse rhonchi. Respiratory effort normal. Cardiovascular system: S1 & S2 heard, RRR. No murmurs. Gastrointestinal system: Abdomen is nondistended, soft and nontender. No organomegaly or masses felt. Normal bowel sounds heard. Central nervous system: Alert and oriented. No focal neurological deficits. Extremities: No edema. No calf tenderness Skin: No cyanosis. No rashes Psychiatry: Judgement and insight appear normal. Mood & affect appropriate.     Data Reviewed: I have personally reviewed following labs and imaging studies  CBC: Recent Labs  Lab 11/06/17 1700  WBC 3.5*  NEUTROABS 2.4  HGB 8.0*  HCT 25.3*  MCV 74.0*  PLT 66*   Basic Metabolic Panel: Recent Labs  Lab 11/06/17 1700 11/07/17 0449 11/08/17 0310  NA 135 133* 135  K 4.6 5.0 4.9  CL 106 103 105  CO2 17* 17* 16*  GLUCOSE 117* 212* 258*  BUN 105* 107* 117*  CREATININE 5.47* 5.57* 5.66*  CALCIUM  8.1* 7.8* 7.6*  PHOS  --   --  6.1*   GFR: Estimated Creatinine Clearance: 11.2 mL/min (A) (by C-G formula based on SCr of 5.66 mg/dL (H)). Liver Function Tests: Recent Labs  Lab 11/08/17 0310  ALBUMIN 2.4*   No results for input(s): LIPASE, AMYLASE in the last 168 hours. No results for input(s): AMMONIA in the last 168 hours. Coagulation Profile: Recent Labs  Lab 11/07/17 1547  INR 1.26   Cardiac Enzymes: No results for input(s): CKTOTAL, CKMB, CKMBINDEX, TROPONINI in the last 168 hours. BNP (last 3 results) Recent Labs    09/25/17 1614  PROBNP 380.0*   HbA1C: No results for input(s): HGBA1C in the last 72 hours. CBG: Recent Labs  Lab 11/07/17 1155 11/07/17 1614 11/07/17 2145 11/08/17 0835 11/08/17 1204  GLUCAP 191* 86 141* 275* 256*   Lipid Profile: No results for input(s): CHOL, HDL, LDLCALC, TRIG, CHOLHDL, LDLDIRECT in the last 72 hours. Thyroid Function Tests: No results for input(s): TSH, T4TOTAL, FREET4, T3FREE, THYROIDAB in the last 72 hours. Anemia Panel: No results for input(s): VITAMINB12, FOLATE, FERRITIN, TIBC, IRON, RETICCTPCT in the last 72 hours. Sepsis Labs: No results for input(s): PROCALCITON, LATICACIDVEN in the last 168 hours.  No results found for this or any previous visit (from the past 240 hour(s)).       Radiology Studies: Dg Chest 2 View  Result Date: 11/06/2017 CLINICAL DATA:  Cough, coughing up blood EXAM: CHEST - 2 VIEW COMPARISON:  09/18/2017 FINDINGS: Diffuse bilateral interstitial and alveolar airspace opacities. No pleural effusion or pneumothorax. Stable cardiomediastinal silhouette. No aggressive osseous lesion. IMPRESSION: Bilateral interstitial and alveolar airspace opacities. Differential diagnosis includes pulmonary edema versus multi lobar pneumonia. Electronically Signed   By: Kathreen Devoid   On: 11/06/2017 18:35        Scheduled Meds: . amLODipine  10 mg Oral Daily  . carvedilol  12.5 mg Oral BID WC  . ferrous  gluconate  324 mg Oral Daily  . insulin aspart  0-9 Units Subcutaneous TID WC  . insulin aspart protamine- aspart  20 Units Subcutaneous Q breakfast  . insulin aspart protamine- aspart  7 Units Subcutaneous Q supper  . multivitamin with minerals  1 tablet Oral Daily  . neomycin-polymyxin-hydrocortisone  4 drop Right EAR TID  . pantoprazole  40 mg Oral Daily  . pneumococcal 23 valent vaccine  0.5 mL Intramuscular Tomorrow-1000  . sodium chloride flush  3 mL Intravenous Q12H   Continuous Infusions: . sodium chloride       LOS: 1 day     Cordelia Poche, MD [facility] 11/08/2017, 12:52 PM Pager: [phone]  If 7PM-7AM, please contact night-coverage [email or web address] Password [email or web address] 11/08/2017, 12:52 PM

## 2017-11-08 NOTE — Plan of Care (Signed)
Patient is noncompliant with antibiotic therapy.  Son acts as Insurance risk surveyor.  No advancement in careplans have been seen this evening.

## 2017-11-08 NOTE — Care Management Note (Signed)
Case Management Note  Patient Details  Name: William [name] MRN: [id number] Date of Birth: [birth year]  Subjective/Objective:                 Patient from home with son, independent for ADLs. Immigrant from [place], does not speak [place], son interprets at bedside. Admitted w fluid overload, cough, hemoptysis. Hx DM, CHF, CKD4 near HD w baseline SCr 3.0-3.5.   PCP- Raul Del Cards- [facility]- [facility]- Mohamed Coverage- Medicaid   Action/Plan:  CM will continue to follow for DC planning.   Expected Discharge Date:                  Expected Discharge Plan:  Home/Self Care  In-House Referral:     Discharge planning Services  CM Consult  Post Acute Care Choice:    Choice offered to:     DME Arranged:    DME Agency:     HH Arranged:    HH Agency:     Status of Service:  In process, will continue to follow  If discussed at Long Length of Stay Meetings, dates discussed:    Additional Comments:  Carles Collet, RN 11/08/2017, 7:40 AM

## 2017-11-08 NOTE — Progress Notes (Signed)
Patient refused Amoxcil antiobiotic this evening.

## 2017-11-09 DIAGNOSIS — Z794 Long term (current) use of insulin: Secondary | ICD-10-CM | POA: Diagnosis not present

## 2017-11-09 DIAGNOSIS — R05 Cough: Secondary | ICD-10-CM | POA: Diagnosis not present

## 2017-11-09 DIAGNOSIS — I5033 Acute on chronic diastolic (congestive) heart failure: Secondary | ICD-10-CM | POA: Diagnosis not present

## 2017-11-09 DIAGNOSIS — I129 Hypertensive chronic kidney disease with stage 1 through stage 4 chronic kidney disease, or unspecified chronic kidney disease: Secondary | ICD-10-CM | POA: Diagnosis not present

## 2017-11-09 DIAGNOSIS — R042 Hemoptysis: Secondary | ICD-10-CM | POA: Diagnosis not present

## 2017-11-09 DIAGNOSIS — N184 Chronic kidney disease, stage 4 (severe): Secondary | ICD-10-CM | POA: Diagnosis not present

## 2017-11-09 DIAGNOSIS — J9621 Acute and chronic respiratory failure with hypoxia: Secondary | ICD-10-CM | POA: Diagnosis not present

## 2017-11-09 DIAGNOSIS — J9601 Acute respiratory failure with hypoxia: Secondary | ICD-10-CM | POA: Diagnosis not present

## 2017-11-09 DIAGNOSIS — E1122 Type 2 diabetes mellitus with diabetic chronic kidney disease: Secondary | ICD-10-CM | POA: Diagnosis not present

## 2017-11-09 DIAGNOSIS — D61818 Other pancytopenia: Secondary | ICD-10-CM | POA: Diagnosis not present

## 2017-11-09 DIAGNOSIS — N179 Acute kidney failure, unspecified: Secondary | ICD-10-CM | POA: Diagnosis not present

## 2017-11-09 DIAGNOSIS — Z23 Encounter for immunization: Secondary | ICD-10-CM | POA: Diagnosis not present

## 2017-11-09 DIAGNOSIS — I509 Heart failure, unspecified: Secondary | ICD-10-CM | POA: Diagnosis not present

## 2017-11-09 DIAGNOSIS — I130 Hypertensive heart and chronic kidney disease with heart failure and stage 1 through stage 4 chronic kidney disease, or unspecified chronic kidney disease: Secondary | ICD-10-CM | POA: Diagnosis not present

## 2017-11-09 LAB — CBC
HCT: 23.2 % — ABNORMAL LOW (ref 39.0–52.0)
HEMOGLOBIN: 7.5 g/dL — AB (ref 13.0–17.0)
MCH: 23.9 pg — ABNORMAL LOW (ref 26.0–34.0)
MCHC: 32.3 g/dL (ref 30.0–36.0)
MCV: 73.9 fL — ABNORMAL LOW (ref 78.0–100.0)
PLATELETS: 53 10*3/uL — AB (ref 150–400)
RBC: 3.14 MIL/uL — ABNORMAL LOW (ref 4.22–5.81)
RDW: 15.1 % (ref 11.5–15.5)
WBC: 2.5 10*3/uL — ABNORMAL LOW (ref 4.0–10.5)

## 2017-11-09 LAB — GLUCOSE, CAPILLARY
GLUCOSE-CAPILLARY: 228 mg/dL — AB (ref 65–99)
GLUCOSE-CAPILLARY: 161 mg/dL — AB (ref 65–99)
Glucose-Capillary: 159 mg/dL — ABNORMAL HIGH (ref 65–99)
Glucose-Capillary: 162 mg/dL — ABNORMAL HIGH (ref 65–99)

## 2017-11-09 LAB — RENAL FUNCTION PANEL
ANION GAP: 15 (ref 5–15)
Albumin: 2.4 g/dL — ABNORMAL LOW (ref 3.5–5.0)
BUN: 124 mg/dL — ABNORMAL HIGH (ref 6–20)
CALCIUM: 7.8 mg/dL — AB (ref 8.9–10.3)
CO2: 17 mmol/L — ABNORMAL LOW (ref 22–32)
Chloride: 107 mmol/L (ref 101–111)
Creatinine, Ser: 5.7 mg/dL — ABNORMAL HIGH (ref 0.61–1.24)
GFR calc non Af Amer: 9 mL/min — ABNORMAL LOW (ref >60)
GFR, EST AFRICAN AMERICAN: 11 mL/min — AB (ref >60)
Glucose, Bld: 176 mg/dL — ABNORMAL HIGH (ref 65–99)
PHOSPHORUS: 6.2 mg/dL — AB (ref 2.5–4.6)
Potassium: 5.3 mmol/L — ABNORMAL HIGH (ref 3.5–5.1)
SODIUM: 139 mmol/L (ref 135–145)

## 2017-11-09 LAB — CYCLIC CITRUL PEPTIDE ANTIBODY, IGG/IGA: CCP ANTIBODIES IGG/IGA: 6 U (ref 0–19)

## 2017-11-09 MED ORDER — POLYVINYL ALCOHOL 1.4 % OP SOLN
1.0000 [drp] | OPHTHALMIC | Status: DC | PRN
  Administered 2017-11-09 – 2017-11-18 (×5): 1 [drp] via OPHTHALMIC
  Filled 2017-11-09: qty 15

## 2017-11-09 MED ORDER — FUROSEMIDE 10 MG/ML IJ SOLN
40.0000 mg | Freq: Every day | INTRAMUSCULAR | Status: DC
  Administered 2017-11-09 – 2017-11-18 (×10): 40 mg via INTRAVENOUS
  Filled 2017-11-09 (×11): qty 4

## 2017-11-09 MED ORDER — SODIUM BICARBONATE 650 MG PO TABS
1300.0000 mg | ORAL_TABLET | Freq: Two times a day (BID) | ORAL | Status: DC
  Administered 2017-11-09 – 2017-11-18 (×19): 1300 mg via ORAL
  Filled 2017-11-09 (×22): qty 2

## 2017-11-09 MED ORDER — CIPROFLOXACIN-DEXAMETHASONE 0.3-0.1 % OT SUSP
4.0000 [drp] | Freq: Two times a day (BID) | OTIC | Status: DC
  Administered 2017-11-09 – 2017-11-18 (×19): 4 [drp] via OTIC
  Filled 2017-11-09: qty 7.5

## 2017-11-09 NOTE — Progress Notes (Signed)
PROGRESS NOTE    William [name]  VOZ:[id number] DOB: 1951/02/15 DOA: 11/06/2017 PCP: Gildardo Pounds, NP   Brief Narrative: William [name] is a 67 y.o. man PMH chronic kidney disease stage IV, cirrhosis, pancytopenia, diabetes mellitus, diastolic heart failure, presented with chronic cough, mild shortness of breath, edema.  Has had extensive workup by pulmonology in the outpatient setting, admitted for acute on chronic hypoxic respiratory failure, acute on chronic diastolic heart failure, acute kidney injury superimposed on CKD stage IV. Concern for Goodpasture's disease  Video interpreter used. Unable to get name and ID number  Assessment & Plan:   Principal Problem:   Acute renal failure superimposed on stage 4 chronic kidney disease (HCC) Active Problems:   Type II diabetes mellitus with renal manifestations (HCC)   Acute on chronic respiratory failure with hypoxia (HCC)   Acute on chronic diastolic CHF (congestive heart failure) (HCC)   Pancytopenia (HCC)   AKI (acute kidney injury) ([place])   Acute hypoxemic respiratory failure (HCC)   Acute renal failure on CKD 4 Worsening. Nephrology on board. Concern for growing need of HD. -Nephrology recommendations  Hemoptysis ?Goodpasture's. Pulmonology on board. High resolution CT scan performed, read still pending. Symptoms have improved from presentation. -Pulmonology recommendations -CBC  Acute respiratory failure with hypoxia Improved. Thought secondary to pulmonary edema in setting of fluid overload from renal failure vs acute diastolic heart failure  Pancytopenia Chronic issue. Followed by hematology as an outpatient  Diabetes mellitus, type 2 Poorly controlled. On 70/30 20 units daily -Watch insulin intake today -Continue Insulin 70/30 20 units -Continue SSI  Acute on chronic diastolic heart failure Appears to be improved today. -Nephrology recs in setting of worsening kidney function  Otitis externa -Switch to  Ciprodex    DVT prophylaxis: SCDs Code Status:   Code Status: Full Code Family Communication: Son at bedside Disposition Plan: Discharge pending kidney/lung workup   Consultants:   Pulmonology  Nephrology  Procedures:   None  Antimicrobials:  Amoxicillin (4/5)   Subjective: Cough. Hemoptysis improved.  Objective: Vitals:   11/08/17 0858 11/09/17 0120 11/09/17 0801 11/09/17 0803  BP: (!) 135/58 137/65 138/72 138/62  Pulse:  75 78 78  Resp:    18  Temp:  98.1 F (36.7 C)  98.7 F (37.1 C)  TempSrc:  Oral  Oral  SpO2: 95% 97%  96%  Weight:      Height:        Intake/Output Summary (Last 24 hours) at 11/09/2017 1121 Last data filed at 11/09/2017 0801 Gross per 24 hour  Intake 228 ml  Output -  Net 228 ml   Filed Weights   11/07/17 0100 11/08/17 0025 11/08/17 0250  Weight: 65.4 kg (144 lb 2.9 oz) 64.9 kg (143 lb 1.3 oz) 62.9 kg (138 lb 10.7 oz)    Examination:  General exam: Appears calm and comfortable Respiratory system: Diffuse rhonchi. Normal respiratory effort. No wheezing. Cardiovascular system: Regular rate and rhythm. Normal S1 and S2. No heart murmurs present. No extra heart sounds Gastrointestinal system: Soft, non-tender, non-distended, no guarding, no rebound, no masses felt Central nervous system: Alert and oriented. No focal neurological deficits. Extremities: No edema. No calf tenderness Skin: No cyanosis. No rashes Psychiatry: Judgement and insight appear normal. Mood & affect appropriate.     Data Reviewed: I have personally reviewed following labs and imaging studies  CBC: Recent Labs  Lab 11/06/17 1700 11/08/17 1328 11/09/17 0652  WBC 3.5* 2.4* 2.5*  NEUTROABS 2.4  --   --  HGB 8.0* 7.6* 7.5*  HCT 25.3* 24.0* 23.2*  MCV 74.0* 73.4* 73.9*  PLT 66* 59* 53*   Basic Metabolic Panel: Recent Labs  Lab 11/06/17 1700 11/07/17 0449 11/08/17 0310 11/09/17 0652  NA 135 133* 135 139  K 4.6 5.0 4.9 5.3*  CL 106 103 105 107    CO2 17* 17* 16* 17*  GLUCOSE 117* 212* 258* 176*  BUN 105* 107* 117* 124*  CREATININE 5.47* 5.57* 5.66* 5.70*  CALCIUM 8.1* 7.8* 7.6* 7.8*  PHOS  --   --  6.1* 6.2*   GFR: Estimated Creatinine Clearance: 11.1 mL/min (A) (by C-G formula based on SCr of 5.7 mg/dL (H)). Liver Function Tests: Recent Labs  Lab 11/08/17 0310 11/09/17 0652  ALBUMIN 2.4* 2.4*   No results for input(s): LIPASE, AMYLASE in the last 168 hours. No results for input(s): AMMONIA in the last 168 hours. Coagulation Profile: Recent Labs  Lab 11/07/17 1547  INR 1.26   Cardiac Enzymes: No results for input(s): CKTOTAL, CKMB, CKMBINDEX, TROPONINI in the last 168 hours. BNP (last 3 results) Recent Labs    09/25/17 1614  PROBNP 380.0*   HbA1C: No results for input(s): HGBA1C in the last 72 hours. CBG: Recent Labs  Lab 11/08/17 1204 11/08/17 1648 11/08/17 2027 11/08/17 2121 11/09/17 0738  GLUCAP 256* 117* 56* 73 228*   Lipid Profile: No results for input(s): CHOL, HDL, LDLCALC, TRIG, CHOLHDL, LDLDIRECT in the last 72 hours. Thyroid Function Tests: No results for input(s): TSH, T4TOTAL, FREET4, T3FREE, THYROIDAB in the last 72 hours. Anemia Panel: No results for input(s): VITAMINB12, FOLATE, FERRITIN, TIBC, IRON, RETICCTPCT in the last 72 hours. Sepsis Labs: No results for input(s): PROCALCITON, LATICACIDVEN in the last 168 hours.  No results found for this or any previous visit (from the past 240 hour(s)).       Radiology Studies: No results found.      Scheduled Meds: . amLODipine  10 mg Oral Daily  . carvedilol  12.5 mg Oral BID WC  . ferrous gluconate  324 mg Oral Daily  . insulin aspart  0-9 Units Subcutaneous TID WC  . insulin aspart protamine- aspart  20 Units Subcutaneous Q breakfast  . insulin aspart protamine- aspart  7 Units Subcutaneous Q supper  . multivitamin with minerals  1 tablet Oral Daily  . neomycin-polymyxin-hydrocortisone  4 drop Right EAR TID  . pantoprazole   40 mg Oral Daily  . sodium chloride flush  3 mL Intravenous Q12H   Continuous Infusions: . sodium chloride       LOS: 2 days     Cordelia Poche, MD [facility] 11/09/2017, 11:21 AM Pager: [phone]  If 7PM-7AM, please contact night-coverage [email or web address] Password [email or web address] 11/09/2017, 11:21 AM

## 2017-11-09 NOTE — Progress Notes (Signed)
S:Feels better O:BP 138/62 (BP Location: Left Arm)   Pulse 78   Temp 98.7 F (37.1 C) (Oral)   Resp 18   Ht 5' 5"  (1.651 m)   Wt 62.9 kg (138 lb 10.7 oz)   SpO2 96%   BMI 23.08 kg/m   Intake/Output Summary (Last 24 hours) at 11/09/2017 1141 Last data filed at 11/09/2017 0801 Gross per 24 hour  Intake 228 ml  Output -  Net 228 ml   Intake/Output: I/O last 3 completed shifts: In: 704 [P.O.:698; I.V.:6] Out: 150 [Urine:150]  Intake/Output this shift:  Total I/O In: 3 [I.V.:3] Out: -  Weight change:  Gen: NAD CVS: no rub Resp: bibasilar crackles Abd: +BS, soft, NT/ND Ext: no edema  Recent Labs  Lab 11/06/17 1700 11/07/17 0449 11/08/17 0310 11/09/17 0652  NA 135 133* 135 139  K 4.6 5.0 4.9 5.3*  CL 106 103 105 107  CO2 17* 17* 16* 17*  GLUCOSE 117* 212* 258* 176*  BUN 105* 107* 117* 124*  CREATININE 5.47* 5.57* 5.66* 5.70*  ALBUMIN  --   --  2.4* 2.4*  CALCIUM 8.1* 7.8* 7.6* 7.8*  PHOS  --   --  6.1* 6.2*   Liver Function Tests: Recent Labs  Lab 11/08/17 0310 11/09/17 0652  ALBUMIN 2.4* 2.4*   No results for input(s): LIPASE, AMYLASE in the last 168 hours. No results for input(s): AMMONIA in the last 168 hours. CBC: Recent Labs  Lab 11/06/17 1700 11/08/17 1328 11/09/17 0652  WBC 3.5* 2.4* 2.5*  NEUTROABS 2.4  --   --   HGB 8.0* 7.6* 7.5*  HCT 25.3* 24.0* 23.2*  MCV 74.0* 73.4* 73.9*  PLT 66* 59* 53*   Cardiac Enzymes: No results for input(s): CKTOTAL, CKMB, CKMBINDEX, TROPONINI in the last 168 hours. CBG: Recent Labs  Lab 11/08/17 1204 11/08/17 1648 11/08/17 2027 11/08/17 2121 11/09/17 0738  GLUCAP 256* 117* 56* 73 228*    Iron Studies: No results for input(s): IRON, TIBC, TRANSFERRIN, FERRITIN in the last 72 hours. Studies/Results: No results found. Marland Kitchen amLODipine  10 mg Oral Daily  . carvedilol  12.5 mg Oral BID WC  . ferrous gluconate  324 mg Oral Daily  . furosemide  40 mg Intravenous Daily  . insulin aspart  0-9 Units Subcutaneous  TID WC  . insulin aspart protamine- aspart  20 Units Subcutaneous Q breakfast  . insulin aspart protamine- aspart  7 Units Subcutaneous Q supper  . multivitamin with minerals  1 tablet Oral Daily  . neomycin-polymyxin-hydrocortisone  4 drop Right EAR TID  . pantoprazole  40 mg Oral Daily  . sodium chloride flush  3 mL Intravenous Q12H    BMET    Component Value Date/Time   NA 139 11/09/2017 0652   NA 137 06/20/2017 1613   K 5.3 (H) 11/09/2017 0652   CL 107 11/09/2017 0652   CO2 17 (L) 11/09/2017 0652   GLUCOSE 176 (H) 11/09/2017 0652   BUN 124 (H) 11/09/2017 0652   BUN 62 (H) 06/20/2017 1613   CREATININE 5.70 (H) 11/09/2017 0652   CREATININE 3.53 (HH) 09/15/2017 0856   CREATININE 1.23 08/31/2015 0859   CALCIUM 7.8 (L) 11/09/2017 0652   GFRNONAA 9 (L) 11/09/2017 0652   GFRNONAA 17 (L) 09/15/2017 0856   GFRNONAA 62 08/31/2015 0859   GFRAA 11 (L) 11/09/2017 0652   GFRAA 19 (L) 09/15/2017 0856   GFRAA 71 08/31/2015 0859   CBC    Component Value Date/Time   WBC  2.5 (L) 11/09/2017 0652   RBC 3.14 (L) 11/09/2017 0652   HGB 7.5 (L) 11/09/2017 0652   HGB 9.2 (L) 06/20/2017 1613   HCT 23.2 (L) 11/09/2017 0652   HCT 29.7 (L) 06/20/2017 1613   PLT 53 (L) 11/09/2017 0652   PLT 68 (L) 10/01/2017 0814   PLT 101 (L) 06/20/2017 1613   MCV 73.9 (L) 11/09/2017 0652   MCV 76 (L) 06/20/2017 1613   MCH 23.9 (L) 11/09/2017 0652   MCHC 32.3 11/09/2017 0652   RDW 15.1 11/09/2017 0652   RDW 16.2 (H) 06/20/2017 1613   LYMPHSABS 0.7 11/06/2017 1700   MONOABS 0.4 11/06/2017 1700   EOSABS 0.1 11/06/2017 1700   BASOSABS 0.0 11/06/2017 1700    Assessment/Plan: 1. 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 Unfortunately he has significant thrombocytopenia so renal biopsy is not possible at this time. 1. I  discussed the case with Drs. Goodrich and Mannam.   2. Recheck anti-GBM Ab in process.  3. Unfortunately he is rapidly approaching dialysis and may require initiation if his renal function continues to deteriorate. Not sure we will improve renal function given small echogenic kidneys on [place] but may benefit from plasmapheresis if they think the hemoptysis is due to anti-GBM disease. 4. Would ask Heme to re-eval and weigh in on possible benefits of plasmapheresis 5. Only trace hematuria on UA and has underlying diabetic nephropathy so difficult to interpret. 6. Will likely need HD cath insertion in the next 24 hours if renal function continues to deteriorate and hopefully will have anti-GBM info by tomorrow. 2. Hemoptysis- as above. Consulted Pulmonology. ECHO with preserved EF so CHF unlikely. Possible infection vs vasculitis/pulmonary-renal syndrome.  High resolution CT scan pending. 3. Pancytopenia- seen by Hematology/Oncology with negative BM biopsy (4% plasma cells) 4. Cirrhosis- unclear etiology, seen on CT scan, negative hepatitis screen.  5. Anemia of CKD stage 4- on epo 10,000 units sq every 2 weeks. Will resume while he is in the hospital with aranesp as he was due for injection today. 6. Metabolic acidosis due to #1 will start bicarb  7. HTN- stable 8. Hyperkalemia due to #1 will start IV lasix and bicarb.  Continue to follow 9. DM per primary    Donetta Potts, MD [facility] [phone]

## 2017-11-10 ENCOUNTER — Inpatient Hospital Stay (HOSPITAL_COMMUNITY): Payer: Medicare Other

## 2017-11-10 DIAGNOSIS — Z23 Encounter for immunization: Secondary | ICD-10-CM | POA: Diagnosis not present

## 2017-11-10 DIAGNOSIS — N184 Chronic kidney disease, stage 4 (severe): Secondary | ICD-10-CM | POA: Diagnosis not present

## 2017-11-10 DIAGNOSIS — Z4901 Encounter for fitting and adjustment of extracorporeal dialysis catheter: Secondary | ICD-10-CM | POA: Diagnosis not present

## 2017-11-10 DIAGNOSIS — I130 Hypertensive heart and chronic kidney disease with heart failure and stage 1 through stage 4 chronic kidney disease, or unspecified chronic kidney disease: Secondary | ICD-10-CM | POA: Diagnosis not present

## 2017-11-10 DIAGNOSIS — N19 Unspecified kidney failure: Secondary | ICD-10-CM | POA: Diagnosis not present

## 2017-11-10 DIAGNOSIS — E1122 Type 2 diabetes mellitus with diabetic chronic kidney disease: Secondary | ICD-10-CM | POA: Diagnosis not present

## 2017-11-10 DIAGNOSIS — I509 Heart failure, unspecified: Secondary | ICD-10-CM | POA: Diagnosis not present

## 2017-11-10 DIAGNOSIS — I5033 Acute on chronic diastolic (congestive) heart failure: Secondary | ICD-10-CM | POA: Diagnosis not present

## 2017-11-10 DIAGNOSIS — J9621 Acute and chronic respiratory failure with hypoxia: Secondary | ICD-10-CM | POA: Diagnosis not present

## 2017-11-10 DIAGNOSIS — N179 Acute kidney failure, unspecified: Secondary | ICD-10-CM | POA: Diagnosis not present

## 2017-11-10 DIAGNOSIS — D61818 Other pancytopenia: Secondary | ICD-10-CM | POA: Diagnosis not present

## 2017-11-10 DIAGNOSIS — R042 Hemoptysis: Secondary | ICD-10-CM | POA: Diagnosis not present

## 2017-11-10 DIAGNOSIS — R05 Cough: Secondary | ICD-10-CM | POA: Diagnosis not present

## 2017-11-10 DIAGNOSIS — I129 Hypertensive chronic kidney disease with stage 1 through stage 4 chronic kidney disease, or unspecified chronic kidney disease: Secondary | ICD-10-CM | POA: Diagnosis not present

## 2017-11-10 DIAGNOSIS — J9601 Acute respiratory failure with hypoxia: Secondary | ICD-10-CM | POA: Diagnosis not present

## 2017-11-10 DIAGNOSIS — Z794 Long term (current) use of insulin: Secondary | ICD-10-CM | POA: Diagnosis not present

## 2017-11-10 HISTORY — PX: IR FLUORO GUIDE CV LINE RIGHT: IMG2283

## 2017-11-10 HISTORY — PX: IR US GUIDE VASC ACCESS RIGHT: IMG2390

## 2017-11-10 LAB — RENAL FUNCTION PANEL
ANION GAP: 14 (ref 5–15)
Albumin: 2.4 g/dL — ABNORMAL LOW (ref 3.5–5.0)
BUN: 131 mg/dL — ABNORMAL HIGH (ref 6–20)
CHLORIDE: 108 mmol/L (ref 101–111)
CO2: 17 mmol/L — AB (ref 22–32)
Calcium: 7.6 mg/dL — ABNORMAL LOW (ref 8.9–10.3)
Creatinine, Ser: 5.74 mg/dL — ABNORMAL HIGH (ref 0.61–1.24)
GFR calc Af Amer: 11 mL/min — ABNORMAL LOW (ref >60)
GFR calc non Af Amer: 9 mL/min — ABNORMAL LOW (ref >60)
Glucose, Bld: 178 mg/dL — ABNORMAL HIGH (ref 65–99)
PHOSPHORUS: 5.8 mg/dL — AB (ref 2.5–4.6)
POTASSIUM: 4.9 mmol/L (ref 3.5–5.1)
Sodium: 139 mmol/L (ref 135–145)

## 2017-11-10 LAB — CBC
HEMATOCRIT: 23 % — AB (ref 39.0–52.0)
HEMOGLOBIN: 7.4 g/dL — AB (ref 13.0–17.0)
MCH: 23.7 pg — AB (ref 26.0–34.0)
MCHC: 32.2 g/dL (ref 30.0–36.0)
MCV: 73.7 fL — AB (ref 78.0–100.0)
Platelets: 60 10*3/uL — ABNORMAL LOW (ref 150–400)
RBC: 3.12 MIL/uL — ABNORMAL LOW (ref 4.22–5.81)
RDW: 15.1 % (ref 11.5–15.5)
WBC: 2.4 10*3/uL — ABNORMAL LOW (ref 4.0–10.5)

## 2017-11-10 LAB — GLUCOSE, CAPILLARY
GLUCOSE-CAPILLARY: 67 mg/dL (ref 65–99)
GLUCOSE-CAPILLARY: 80 mg/dL (ref 65–99)
GLUCOSE-CAPILLARY: 97 mg/dL (ref 65–99)
Glucose-Capillary: 236 mg/dL — ABNORMAL HIGH (ref 65–99)
Glucose-Capillary: 163 mg/dL — ABNORMAL HIGH (ref 65–99)

## 2017-11-10 LAB — IMMUNOFIXATION ELECTROPHORESIS
IGG (IMMUNOGLOBIN G), SERUM: 1473 mg/dL (ref 700–1600)
IgA: 300 mg/dL (ref 61–437)
IgM (Immunoglobulin M), Srm: 46 mg/dL (ref 20–172)
TOTAL PROTEIN ELP: 6.2 g/dL (ref 6.0–8.5)

## 2017-11-10 LAB — IMMUNOFIXATION, URINE

## 2017-11-10 LAB — GLOMERULAR BASEMENT MEMBRANE ANTIBODIES: GBM Ab: 29 units — ABNORMAL HIGH (ref 0–20)

## 2017-11-10 MED ORDER — LIDOCAINE HCL (PF) 1 % IJ SOLN
INTRAMUSCULAR | Status: AC | PRN
  Administered 2017-11-10: 10 mL

## 2017-11-10 MED ORDER — FENTANYL CITRATE (PF) 100 MCG/2ML IJ SOLN
INTRAMUSCULAR | Status: AC
  Filled 2017-11-10: qty 2

## 2017-11-10 MED ORDER — LIDOCAINE HCL 1 % IJ SOLN
INTRAMUSCULAR | Status: AC
  Filled 2017-11-10: qty 20

## 2017-11-10 MED ORDER — MIDAZOLAM HCL 2 MG/2ML IJ SOLN
INTRAMUSCULAR | Status: AC | PRN
  Administered 2017-11-10 (×2): 0.5 mg via INTRAVENOUS
  Administered 2017-11-10: 1 mg via INTRAVENOUS

## 2017-11-10 MED ORDER — SODIUM CHLORIDE 0.9 % IV SOLN
1000.0000 mg | Freq: Every day | INTRAVENOUS | Status: AC
  Administered 2017-11-10 – 2017-11-12 (×3): 1000 mg via INTRAVENOUS
  Filled 2017-11-10 (×4): qty 8

## 2017-11-10 MED ORDER — CEFAZOLIN SODIUM-DEXTROSE 2-4 GM/100ML-% IV SOLN
2.0000 g | Freq: Once | INTRAVENOUS | Status: AC
  Administered 2017-11-10: 2 g via INTRAVENOUS

## 2017-11-10 MED ORDER — FENTANYL CITRATE (PF) 100 MCG/2ML IJ SOLN
INTRAMUSCULAR | Status: AC | PRN
  Administered 2017-11-10 (×2): 25 ug via INTRAVENOUS
  Administered 2017-11-10: 50 ug via INTRAVENOUS

## 2017-11-10 MED ORDER — HEPARIN SODIUM (PORCINE) 1000 UNIT/ML IJ SOLN
INTRAMUSCULAR | Status: AC
  Administered 2017-11-10: 3.2 [IU]
  Filled 2017-11-10: qty 1

## 2017-11-10 MED ORDER — MIDAZOLAM HCL 2 MG/2ML IJ SOLN
INTRAMUSCULAR | Status: AC
  Filled 2017-11-10: qty 2

## 2017-11-10 MED ORDER — PREDNISONE 20 MG PO TABS
60.0000 mg | ORAL_TABLET | Freq: Every day | ORAL | Status: DC
  Administered 2017-11-13 – 2017-11-18 (×6): 60 mg via ORAL
  Filled 2017-11-10 (×7): qty 3

## 2017-11-10 MED ORDER — CEFAZOLIN SODIUM-DEXTROSE 2-4 GM/100ML-% IV SOLN
INTRAVENOUS | Status: AC
  Filled 2017-11-10: qty 100

## 2017-11-10 NOTE — Sedation Documentation (Signed)
Patient is resting comfortably. 

## 2017-11-10 NOTE — Plan of Care (Signed)
Patient does not speak [place], son acts as Astronomer.  Patient appears to be making advancements in care plan goals.

## 2017-11-10 NOTE — Procedures (Signed)
Interventional Radiology Procedure Note  Procedure: Tunneled right IJ HD catheter  Complications: None  Estimated Blood Loss: < 10 mL  Findings: 19 cm tip to cuff length Palindrome HD catheter placed via right IJ vein.  Tip in RA.  OK to use.  Venetia Night. Kathlene Cote, M.D Pager:  [phone]

## 2017-11-10 NOTE — Progress Notes (Signed)
PROGRESS NOTE    William [name]  ZOX:[id number] DOB: 21-Jun-1951 DOA: 11/06/2017 PCP: Gildardo Pounds, NP   Brief Narrative: William [name] is a 67 y.o. man PMH chronic kidney disease stage IV, cirrhosis, pancytopenia, diabetes mellitus, diastolic heart failure, presented with chronic cough, mild shortness of breath, edema.  Has had extensive workup by pulmonology in the outpatient setting, admitted for acute on chronic hypoxic respiratory failure, acute on chronic diastolic heart failure, acute kidney injury superimposed on CKD stage IV. Concern for Goodpasture's disease  Video interpreter used. Unable to get name and ID number  Assessment & Plan:   Principal Problem:   Acute renal failure superimposed on stage 4 chronic kidney disease (HCC) Active Problems:   Type II diabetes mellitus with renal manifestations (HCC)   Acute on chronic respiratory failure with hypoxia (HCC)   Acute on chronic diastolic CHF (congestive heart failure) (HCC)   Pancytopenia (HCC)   AKI (acute kidney injury) ([place])   Acute hypoxemic respiratory failure (HCC)   Acute renal failure on CKD 4 Worsening. Nephrology on board. Concern for growing need of HD. RF, ANA, CCP negative. ANCA, anti-gbm pending -Nephrology recommendations: plan for HD vs plasmaphoresis  Hemoptysis ?Goodpasture's. Pulmonology on board. High resolution CT scan performed, read still pending (called radiology and it is currently being read). Symptoms have improved from presentation. Likely related to thrombocytopenia -Pulmonology recommendations -CBC  Acute respiratory failure with hypoxia Improved. Thought secondary to pulmonary edema in setting of fluid overload from renal failure vs acute diastolic heart failure  Pancytopenia Chronic issue. Followed by hematology as an outpatient. Stable.  Diabetes mellitus, type 2 Poorly controlled. On 70/30 20 units daily -Watch insulin intake today -Continue Insulin 70/30 20 units -Continue  SSI  Acute on chronic diastolic heart failure Appears to be improved. -Nephrology recs in setting of worsening kidney function  Otitis externa -Continue Ciprodex    DVT prophylaxis: SCDs Code Status:   Code Status: Full Code Family Communication: Son at bedside Disposition Plan: Discharge pending kidney/lung workup   Consultants:   Pulmonology  Nephrology  Procedures:   None  Antimicrobials:  Amoxicillin (4/5)  Ciprodex   Subjective: Improved overall. Ear pain resolved. Hemoptysis resolved.  Objective: Vitals:   11/09/17 1657 11/10/17 0024 11/10/17 0812 11/10/17 0900  BP: 128/60 (!) 144/65 (!) 146/56   Pulse: 79 80 83   Resp:   16   Temp: 98.9 F (37.2 C) 99 F (37.2 C) 98.7 F (37.1 C)   TempSrc: Oral Oral Oral   SpO2: 98% 98% 96%   Weight:    63.5 kg (139 lb 15.9 oz)  Height:        Intake/Output Summary (Last 24 hours) at 11/10/2017 0939 Last data filed at 11/09/2017 1700 Gross per 24 hour  Intake 300 ml  Output -  Net 300 ml   Filed Weights   11/08/17 0025 11/08/17 0250 11/10/17 0900  Weight: 64.9 kg (143 lb 1.3 oz) 62.9 kg (138 lb 10.7 oz) 63.5 kg (139 lb 15.9 oz)    Examination:  General exam: Appears calm and comfortable Respiratory: Diffuse rhonchi. Unlabored work of breathing. No wheezing or rales. Cardiovascular system: Regular rate and rhythm. Normal S1 and S2. 2/6 systolic murmur present. No extra heart sounds Gastrointestinal system: Soft, non-tender, non-distended, no guarding, no rebound, no masses felt Central nervous system: Alert and oriented. No focal neurological deficits. Extremities: No edema. No calf tenderness Skin: No cyanosis. No rashes Psychiatry: Judgement and insight appear normal. Mood & affect appropriate.  Data Reviewed: I have personally reviewed following labs and imaging studies  CBC: Recent Labs  Lab 11/06/17 1700 11/08/17 1328 11/09/17 0652 11/10/17 0412  WBC 3.5* 2.4* 2.5* 2.4*  NEUTROABS 2.4   --   --   --   HGB 8.0* 7.6* 7.5* 7.4*  HCT 25.3* 24.0* 23.2* 23.0*  MCV 74.0* 73.4* 73.9* 73.7*  PLT 66* 59* 53* 60*   Basic Metabolic Panel: Recent Labs  Lab 11/06/17 1700 11/07/17 0449 11/08/17 0310 11/09/17 0652 11/10/17 0412  NA 135 133* 135 139 139  K 4.6 5.0 4.9 5.3* 4.9  CL 106 103 105 107 108  CO2 17* 17* 16* 17* 17*  GLUCOSE 117* 212* 258* 176* 178*  BUN 105* 107* 117* 124* 131*  CREATININE 5.47* 5.57* 5.66* 5.70* 5.74*  CALCIUM 8.1* 7.8* 7.6* 7.8* 7.6*  PHOS  --   --  6.1* 6.2* 5.8*   GFR: Estimated Creatinine Clearance: 11 mL/min (A) (by C-G formula based on SCr of 5.74 mg/dL (H)). Liver Function Tests: Recent Labs  Lab 11/08/17 0310 11/09/17 0652 11/10/17 0412  ALBUMIN 2.4* 2.4* 2.4*   No results for input(s): LIPASE, AMYLASE in the last 168 hours. No results for input(s): AMMONIA in the last 168 hours. Coagulation Profile: Recent Labs  Lab 11/07/17 1547  INR 1.26   Cardiac Enzymes: No results for input(s): CKTOTAL, CKMB, CKMBINDEX, TROPONINI in the last 168 hours. BNP (last 3 results) Recent Labs    09/25/17 1614  PROBNP 380.0*   HbA1C: No results for input(s): HGBA1C in the last 72 hours. CBG: Recent Labs  Lab 11/09/17 0738 11/09/17 1140 11/09/17 1655 11/09/17 2056 11/10/17 0749  GLUCAP 228* 161* 159* 162* 236*   Lipid Profile: No results for input(s): CHOL, HDL, LDLCALC, TRIG, CHOLHDL, LDLDIRECT in the last 72 hours. Thyroid Function Tests: No results for input(s): TSH, T4TOTAL, FREET4, T3FREE, THYROIDAB in the last 72 hours. Anemia Panel: No results for input(s): VITAMINB12, FOLATE, FERRITIN, TIBC, IRON, RETICCTPCT in the last 72 hours. Sepsis Labs: No results for input(s): PROCALCITON, LATICACIDVEN in the last 168 hours.  No results found for this or any previous visit (from the past 240 hour(s)).       Radiology Studies: No results found.      Scheduled Meds: . amLODipine  10 mg Oral Daily  . carvedilol  12.5 mg  Oral BID WC  . ciprofloxacin-dexamethasone  4 drop Right EAR BID  . ferrous gluconate  324 mg Oral Daily  . furosemide  40 mg Intravenous Daily  . insulin aspart  0-9 Units Subcutaneous TID WC  . insulin aspart protamine- aspart  20 Units Subcutaneous Q breakfast  . insulin aspart protamine- aspart  7 Units Subcutaneous Q supper  . multivitamin with minerals  1 tablet Oral Daily  . pantoprazole  40 mg Oral Daily  . sodium bicarbonate  1,300 mg Oral BID  . sodium chloride flush  3 mL Intravenous Q12H   Continuous Infusions: . sodium chloride       LOS: 3 days     Cordelia Poche, MD [facility] 11/10/2017, 9:39 AM Pager: [phone]  If 7PM-7AM, please contact night-coverage [email or web address] Password [facility] 11/10/2017, 9:39 AM

## 2017-11-10 NOTE — Consult Note (Signed)
Chief Complaint: Patient was seen in consultation today for tunneled dialysis catheter placement Chief Complaint  Patient presents with  . Cough   at the request of Dr Georgena Spurling  Referring Physician(s): Dr Graylon Gunning  Supervising Physician: Aletta Edouard  Patient Status: [facility] - In-pt  History of Present Illness: William [name] is a 67 y.o. male   CKD IV; cirrhosis; pancytopenia; DHF Chronic cough; SOB; edema Acute on chronic renal failure Worsening renal function Nephrology Rec: tunneled dialysis catheter at this point  Dr Posey Pronto note:  1.  Acute kidney injury on chronic kidney disease stage III versus rapidly progressive GN from pulmonary-renal syndrome 1. Testing for possible immune GN/ANCA vasculitis has been negative and results of anti-GBM antibody is pending as is the reading of HRCT chest.  Will consult interventional radiology for placement of a dialysis catheter (tunneled versus temporary based on their cutoff with regards to thrombocytopenia) to begin preparations for plasma exchange versus hemodialysis.    Request made for tunneled catheter Pt ate breakfast this am-- npo since 800-830 am today Plts 60 today Approved with Dr Kathlene Cote  Past Medical History:  Diagnosis Date  . Cirrhosis ([place])   . CKD (chronic kidney disease), stage IV ([place])   . Diabetes mellitus without complication ([place])   . Grade I diastolic dysfunction 23/76/2831  . History of anemia due to chronic kidney disease   . Hypertension   . Pancytopenia ([place]) 08/20/2017    Past Surgical History:  Procedure Laterality Date  . EYE SURGERY Bilateral    Cataract  . NECK SURGERY      Allergies: Patient has no known allergies.  Medications: Prior to Admission medications   Medication Sig Start Date End Date Taking? Authorizing Provider  amLODipine (NORVASC) 10 MG tablet Take 1 tablet (10 mg total) daily by mouth. 06/20/17 11/06/24 Yes Gildardo Pounds, NP  amoxicillin (AMOXIL) 875 MG tablet Take  875 mg by mouth 2 (two) times daily. 10/31/17  Yes [provider]  carvedilol (COREG) 12.5 MG tablet Take 12.5 mg by mouth 2 (two) times daily with a meal.   Yes [provider]  ferrous gluconate (IRON 27) 240 (27 FE) MG tablet Take 1 tablet by mouth daily.   Yes [provider]  furosemide (LASIX) 20 MG tablet Take 40 mg by mouth daily.    Yes [provider]  insulin NPH-insulin regular (NOVOLIN 70/30) (70-30) 100 UNIT/ML injection Inject 10-30 Units into the skin See admin instructions. 30 units in am and 10 units at night    Yes [provider]  Multiple Vitamins-Minerals (MULTIVITAMIN WITH MINERALS) tablet Take 1 tablet by mouth daily.   Yes [provider]  neomycin-polymyxin-hydrocortisone (CORTISPORIN) 3.5-10000-1 OTIC suspension Place 4 drops into the right ear 3 (three) times daily. 11/03/17  Yes [provider]  pantoprazole (PROTONIX) 40 MG tablet Take 1 tablet (40 mg total) by mouth daily. Take 30-60 min before first meal of the day 10/13/17  Yes Tanda Rockers, MD     Family History  Problem Relation Age of Onset  . Diabetes Mellitus II Sister   . Stroke Brother     Social History   Socioeconomic History  . Marital status: Single    Spouse name: Not on file  . Number of children: Not on file  . Years of education: Not on file  . Highest education level: Not on file  Occupational History  . Not on file  Social Needs  . Emergency planning/management officer  strain: Not on file  . Food insecurity:    Worry: Not on file    Inability: Not on file  . Transportation needs:    Medical: Not on file    Non-medical: Not on file  Tobacco Use  . Smoking status: Never Smoker  . Smokeless tobacco: Never Used  Substance and Sexual Activity  . Alcohol use: No  . Drug use: Not Currently  . Sexual activity: Not on file  Lifestyle  . Physical activity:    Days per week: Not on file    Minutes per session: Not on file  . Stress: Not on  file  Relationships  . Social connections:    Talks on phone: Not on file    Gets together: Not on file    Attends religious service: Not on file    Active member of club or organization: Not on file    Attends meetings of clubs or organizations: Not on file    Relationship status: Not on file  Other Topics Concern  . Not on file  Social History Narrative  . Not on file    Review of Systems: A 12 point ROS discussed and pertinent positives are indicated in the HPI above.  All other systems are negative.  Review of Systems  Constitutional: Positive for fatigue. Negative for fever.  Respiratory: Positive for cough and shortness of breath.   Gastrointestinal: Negative for abdominal pain.  Psychiatric/Behavioral: Negative for behavioral problems and confusion.    Vital Signs: BP (!) 146/56 (BP Location: Left Arm)   Pulse 83   Temp 98.7 F (37.1 C) (Oral)   Resp 16   Ht 5' 5"  (1.651 m)   Wt 139 lb 15.9 oz (63.5 kg)   SpO2 96%   BMI 23.30 kg/m   Physical Exam  Constitutional: He is oriented to person, place, and time.  Cardiovascular: Normal rate and regular rhythm.  Pulmonary/Chest: He has wheezes.  Abdominal: Soft. Bowel sounds are normal.  Musculoskeletal: Normal range of motion.  Neurological: He is alert and oriented to person, place, and time.  Skin: Skin is warm and dry.  Psychiatric: He has a normal mood and affect. His behavior is normal. Judgment and thought content normal.  Pt does not speak [place] Son at bedside---interpreted for pt  Nursing note and vitals reviewed.   Imaging: Dg Chest 2 View  Result Date: 11/06/2017 CLINICAL DATA:  Cough, coughing up blood EXAM: CHEST - 2 VIEW COMPARISON:  09/18/2017 FINDINGS: Diffuse bilateral interstitial and alveolar airspace opacities. No pleural effusion or pneumothorax. Stable cardiomediastinal silhouette. No aggressive osseous lesion. IMPRESSION: Bilateral interstitial and alveolar airspace opacities. Differential  diagnosis includes pulmonary edema versus multi lobar pneumonia. Electronically Signed   By: Kathreen Devoid   On: 11/06/2017 18:35   Ct Chest High Resolution  Result Date: 11/10/2017 CLINICAL DATA:  Cough, ongoing hemoptysis, rapidly progressive kidney failure. Shortness of breath. EXAM: CT CHEST WITHOUT CONTRAST TECHNIQUE: Multidetector CT imaging of the chest was performed following the standard protocol without intravenous contrast. High resolution imaging of the lungs, as well as inspiratory and expiratory imaging, was performed. COMPARISON:  09/17/2017. FINDINGS: Cardiovascular: Atherosclerotic calcification of the arterial vasculature, including coronary arteries. Heart is enlarged. No pericardial effusion. Mediastinum/Nodes: Mediastinal lymph nodes are not enlarged by CT size criteria. Hilar regions are difficult to evaluate without IV contrast. No axillary adenopathy. Esophagus is grossly unremarkable. Distal periesophageal lymph nodes are not enlarged by CT size criteria. Lungs/Pleura: Patchy bilateral ground-glass with septal thickening, some  of which is new in the right upper lobe. Associated mild architectural distortion, peribronchial thickening and bronchiectasis in the lower lobes. Mild peribronchovascular consolidation in both lower lobes as well. Tiny right pleural effusion, decreased. Small left pleural effusion, also decreased. Upper Abdomen: Liver margin is irregular. Visualized portions of the liver, gallbladder, adrenal glands and right kidney are otherwise grossly unremarkable. Spleen appears enlarged but is incompletely imaged. Visualized portions of the stomach and bowel are grossly unremarkable. Upper abdominal lymph nodes are not enlarged by CT size criteria. Musculoskeletal: No worrisome lytic or sclerotic lesions. IMPRESSION: 1. Patchy bilateral ground-glass with septal thickening, some of which is new in the right upper lobe. Associated areas of new architectural distortion. Findings  may be due to a combination of pulmonary hemorrhage superimposed on evolving postinflammatory fibrosis. Chronic hypersensitivity pneumonitis is not excluded. 2. Small bilateral pleural effusions, left greater than right, decreased from prior. 3. Cirrhosis. 4. Spleen appears enlarged but is incompletely imaged. 5. Aortic atherosclerosis (ICD10-170.0). Coronary artery calcification. Electronically Signed   By: Lorin Picket M.D.   On: 11/10/2017 09:57    Labs:  CBC: Recent Labs    11/06/17 1700 11/08/17 1328 11/09/17 0652 11/10/17 0412  WBC 3.5* 2.4* 2.5* 2.4*  HGB 8.0* 7.6* 7.5* 7.4*  HCT 25.3* 24.0* 23.2* 23.0*  PLT 66* 59* 53* 60*    COAGS: Recent Labs    11/07/17 1547  INR 1.26  APTT 39*    BMP: Recent Labs    11/07/17 0449 11/08/17 0310 11/09/17 0652 11/10/17 0412  NA 133* 135 139 139  K 5.0 4.9 5.3* 4.9  CL 103 105 107 108  CO2 17* 16* 17* 17*  GLUCOSE 212* 258* 176* 178*  BUN 107* 117* 124* 131*  CALCIUM 7.8* 7.6* 7.8* 7.6*  CREATININE 5.57* 5.66* 5.70* 5.74*  GFRNONAA 10* 9* 9* 9*  GFRAA 11* 11* 11* 11*    LIVER FUNCTION TESTS: Recent Labs    06/20/17 1613  09/03/17 1255 09/15/17 0856 11/08/17 0310 11/09/17 0652 11/10/17 0412  BILITOT 0.4  --  0.6 0.6  --   --   --   AST 20  --  24 22  --   --   --   ALT 10  --  10 10  --   --   --   ALKPHOS 125*  --  108 101  --   --   --   PROT 6.6  --  7.0 6.9  --   --   --   ALBUMIN 3.3*   < > 3.1* 3.0* 2.4* 2.4* 2.4*   < > = values in this interval not displayed.    TUMOR MARKERS: No results for input(s): AFPTM, CEA, CA199, CHROMGRNA in the last 8760 hours.  Assessment and Plan:  A/CRF Worsening renal function Need to instititue dialysis per Renal MD Scheduled for tunneled dialysis catheter placement Risks and benefits discussed with the patient and son including, but not limited to bleeding, infection, vascular injury, pneumothorax which may require chest tube placement, air embolism or even  death  All of the patient's and son questions were answered, patient is agreeable to proceed. Consent signed and in chart.    Thank you for this interesting consult.  I greatly enjoyed meeting William [name] and look forward to participating in their care.  A copy of this report was sent to the requesting provider on this date.  Electronically Signed: Lavonia Drafts, PA-C 11/10/2017, 11:20 AM   I spent a total  of 20 Minutes    in face to face in clinical consultation, greater than 50% of which was counseling/coordinating care for tunneled dialysis catheter placement

## 2017-11-10 NOTE — Progress Notes (Addendum)
ID: William [name], male   DOB: 02/03/1951, 67 y.o.   MRN: [id number] Patient seen along with his son to discuss results of anti-GBM antibody level which show rising titers when compared to his previous labs done about 2 months ago.  With his current clinical picture, will start him on plasmapheresis every other day for the next 2 weeks and begin high-dose Solu-Medrol with the plan of transitioning this to oral prednisone after 72 hours.  We will continue to monitor him closely for emergent dialysis needs which with his current BUN and anticipated corticosteroid course-may likely be needed for uremic symptoms.  I appreciate the assistance from interventional radiology with placement of a dialysis catheter.  Will reevaluate immunosuppressive management with cyclophosphamide versus rituximab based on his CBC over the next 2-3 days and plan for kidney biopsy during this duration. I have discussed and formulated this plan in collaboration with Dr. Chase Caller of CCM/pulmonary.  Elmarie Shiley MD [facility]. Office # [phone] Pager # [phone] 3:17 PM

## 2017-11-10 NOTE — Progress Notes (Signed)
Patient ID: William [name], male   DOB: 12/15/1950, 67 y.o.   MRN: [id number] [facility] Progress Note   Assessment/ Plan:   1. 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  Will consult interventional radiology for placement of a dialysis catheter (tunneled versus temporary based on their cutoff with regards to thrombocytopenia) to begin preparations for plasma exchange versus hemodialysis.  He does not have any critical electrolyte abnormalities, volume overload or uremic symptoms to prompt need for emergent hemodialysis.  His pancytopenia is likely to raise some barriers to getting a kidney biopsy as well as immunosuppressive therapy if indeed his anti-GBM status is positive. 2. Pancytopenia- seen by Hematology/Oncology with negative BM biopsy (4% plasma cells) 3. Cirrhosis- unclear etiology, seen on CT scan, negative hepatitis screen.  4. Anemia of CKD stage 4-without overt loss, continue ESA while in hospital. 5. Metabolic acidosis : Started on oral sodium bicarbonate, continue to monitor levels  6. HTN- stable 7. Hyperkalemia: Mild and successfully treated with sodium bicarbonate/furosemide. 8. DM per primary   Subjective:   No acute events overnight, anxiously awaiting lab testing results   Objective:   BP (!) 146/56 (BP Location: Left Arm)   Pulse 83   Temp 98.7 F (37.1 C) (Oral)   Resp 16   Ht 5' 5"  (1.651 m)   Wt 63.5 kg (139 lb 15.9 oz)   SpO2 96%   BMI 23.30 kg/m   Intake/Output Summary (Last 24 hours) at 11/10/2017 0921 Last data filed at 11/09/2017 1700 Gross per 24 hour  Intake 300 ml  Output -  Net 300 ml   Weight change:   Physical Exam: Gen: Comfortably sitting up in bed, son at bedside CVS: Pulse regular rhythm, normal rate, S1 and S2  with ejection systolic murmur Resp: Coarse rales bilaterally, no distinct rhonchi Abd: Soft, flat, nontender Ext: No lower extremity edema  Imaging: No results found.  Labs: BMET Recent Labs  Lab 11/06/17 1700 11/07/17 0449 11/08/17 0310 11/09/17 0652 11/10/17 0412  NA 135 133* 135 139 139  K 4.6 5.0 4.9 5.3* 4.9  CL 106 103 105 107 108  CO2 17* 17* 16* 17* 17*  GLUCOSE 117* 212* 258* 176* 178*  BUN 105* 107* 117* 124* 131*  CREATININE 5.47* 5.57* 5.66* 5.70* 5.74*  CALCIUM 8.1* 7.8* 7.6* 7.8* 7.6*  PHOS  --   --  6.1* 6.2* 5.8*   CBC Recent Labs  Lab 11/06/17 1700 11/08/17 1328 11/09/17 0652 11/10/17 0412  WBC 3.5* 2.4* 2.5* 2.4*  NEUTROABS 2.4  --   --   --   HGB 8.0* 7.6* 7.5* 7.4*  HCT 25.3* 24.0* 23.2* 23.0*  MCV 74.0* 73.4* 73.9* 73.7*  PLT 66* 59* 53* 60*    Medications:    . amLODipine  10 mg Oral Daily  . carvedilol  12.5 mg Oral BID WC  . ciprofloxacin-dexamethasone  4 drop Right EAR BID  . ferrous gluconate  324 mg Oral Daily  . furosemide  40 mg Intravenous Daily  . insulin aspart  0-9 Units Subcutaneous TID WC  . insulin aspart protamine- aspart  20 Units Subcutaneous Q breakfast  . insulin aspart protamine- aspart  7 Units Subcutaneous Q supper  . multivitamin with minerals  1 tablet Oral Daily  . pantoprazole  40 mg Oral  Daily  . sodium bicarbonate  1,300 mg Oral BID  . sodium chloride flush  3 mL Intravenous Q12H   Elmarie Shiley, MD 11/10/2017, 9:21 AM

## 2017-11-10 NOTE — Progress Notes (Signed)
   Name: William [name] MRN: [id number] DOB: 03/21/1951    ADMISSION DATE:  11/06/2017 CONSULTATION DATE:  11/07/17  REFERRING MD:  Murray Hodgkins, MD  CHIEF COMPLAINT:  Cough and hemoptysis   Brief HPI:  67 year old with hypertension, diabetes, diastolic heart failure, cirrhosis, CKD evaluated in the pulmonary clinic for hemoptysis.  Also been seen by hematology for pancytopenia with bone marrow biopsy which is nondiagnostic Admitted for cough, ongoing hemoptysis, rapidly progressive kidney failure Previous serologies noted for mildly positive GBM antibody. He is an immigrant from [place] with previously treated schistosomia infection.  Non-smoker, used to work as a Administrator.  No known exposures.   SUBJECTIVE:  Son at bedside who translates for patient states hemoptysis resolved, dry cough now No complaints per patient Currently on room air, hemodynamically stable.  VITAL SIGNS: Temp:  [98.7 F (37.1 C)-99 F (37.2 C)] 98.7 F (37.1 C) (04/08 0812) Pulse Rate:  [79-83] 83 (04/08 0812) Resp:  [16] 16 (04/08 0812) BP: (128-146)/(56-65) 146/56 (04/08 0812) SpO2:  [96 %-98 %] 96 % (04/08 0812) Weight:  [139 lb 15.9 oz (63.5 kg)] 139 lb 15.9 oz (63.5 kg) (04/08 0900)  PHYSICAL EXAMINATION: General:  Older male lying in bed in NAD HEENT: MM pink/moist, mild +JVD Neuro: Alert, verbal, MAE CV:  rrr, 2+pulses PULM: even/non-labored,diffuse crackles, dry cough, room air GI: soft, non-tender, bs active  Extremities: warm/dry, no edema  Skin: no rashes    Recent Labs  Lab 11/08/17 0310 11/09/17 0652 11/10/17 0412  NA 135 139 139  K 4.9 5.3* 4.9  CL 105 107 108  CO2 16* 17* 17*  BUN 117* 124* 131*  CREATININE 5.66* 5.70* 5.74*  GLUCOSE 258* 176* 178*   Recent Labs  Lab 11/08/17 1328 11/09/17 0652 11/10/17 0412  HGB 7.6* 7.5* 7.4*  HCT 24.0* 23.2* 23.0*  WBC 2.4* 2.5* 2.4*  PLT 59* 53* 60*   No results found.  ASSESSMENT / PLAN: Patient is a 67 y.o male with  HTN, DM, HFpEF, Cirrhosis, Pancytopenia, and CKD stage 4 who presented to the ED on 4/04 with cough and hemoptysis that has gotten progressively worse over the past several years. Work-up is significant for bilateral ground glass opacities with associated volume loss of the left lobe and rapidly rising creatinine.   Acute Hypoxic Respiratory Failure Diffuse Ground Glass Opacities RPGN  - extensive work-up over the past several months. Including: Protein to creatinine ratio >4000, negative ANA, negative ANCA, negative hepatitis B and C, negative HIV, negative bone marrow biopsy (4% plast cells), and renal ultrasound showing increased echogenicity.  - testing thus far for immune GN/ ANCA vasculitis negative thus far - hematology  Re-consulted by Nephrology - TTE with preserved EF - stable oxygenation on room air P:  HRCT from 4/5 pending official read Pending Anti-GBM, immunofixation  Schistosomiasis serology  pending - needs to be sent Renal biopsy not an option at this point given his thrombocytopenia HD cath to be placed by IR for possible plasmapheresis vs nearing dialysis  Other  Can consider bronchoscopy with BAL for further pulmonary workup, will discuss with attending.    Rest per primary.    Kennieth Rad, AGACNP-BC [facility] Pgr: [phone] or if no answer [phone] 11/10/2017, 9:54 AM

## 2017-11-11 ENCOUNTER — Encounter (HOSPITAL_COMMUNITY): Payer: Self-pay | Admitting: Interventional Radiology

## 2017-11-11 ENCOUNTER — Telehealth: Payer: Self-pay | Admitting: Internal Medicine

## 2017-11-11 ENCOUNTER — Inpatient Hospital Stay (HOSPITAL_COMMUNITY): Payer: Medicare Other

## 2017-11-11 DIAGNOSIS — E1122 Type 2 diabetes mellitus with diabetic chronic kidney disease: Secondary | ICD-10-CM | POA: Diagnosis not present

## 2017-11-11 DIAGNOSIS — I130 Hypertensive heart and chronic kidney disease with heart failure and stage 1 through stage 4 chronic kidney disease, or unspecified chronic kidney disease: Secondary | ICD-10-CM | POA: Diagnosis not present

## 2017-11-11 DIAGNOSIS — N179 Acute kidney failure, unspecified: Secondary | ICD-10-CM | POA: Diagnosis not present

## 2017-11-11 DIAGNOSIS — J9621 Acute and chronic respiratory failure with hypoxia: Secondary | ICD-10-CM | POA: Diagnosis not present

## 2017-11-11 DIAGNOSIS — D61818 Other pancytopenia: Secondary | ICD-10-CM | POA: Diagnosis not present

## 2017-11-11 DIAGNOSIS — J9601 Acute respiratory failure with hypoxia: Secondary | ICD-10-CM | POA: Diagnosis not present

## 2017-11-11 DIAGNOSIS — I5033 Acute on chronic diastolic (congestive) heart failure: Secondary | ICD-10-CM | POA: Diagnosis not present

## 2017-11-11 DIAGNOSIS — R042 Hemoptysis: Secondary | ICD-10-CM | POA: Diagnosis not present

## 2017-11-11 DIAGNOSIS — N184 Chronic kidney disease, stage 4 (severe): Secondary | ICD-10-CM | POA: Diagnosis not present

## 2017-11-11 DIAGNOSIS — R05 Cough: Secondary | ICD-10-CM | POA: Diagnosis not present

## 2017-11-11 DIAGNOSIS — Z23 Encounter for immunization: Secondary | ICD-10-CM | POA: Diagnosis not present

## 2017-11-11 DIAGNOSIS — I129 Hypertensive chronic kidney disease with stage 1 through stage 4 chronic kidney disease, or unspecified chronic kidney disease: Secondary | ICD-10-CM | POA: Diagnosis not present

## 2017-11-11 LAB — URINALYSIS, ROUTINE W REFLEX MICROSCOPIC
Bilirubin Urine: NEGATIVE
GLUCOSE, UA: 50 mg/dL — AB
KETONES UR: NEGATIVE mg/dL
Leukocytes, UA: NEGATIVE
Nitrite: NEGATIVE
Specific Gravity, Urine: 1.014 (ref 1.005–1.030)
Squamous Epithelial / LPF: NONE SEEN
pH: 5 (ref 5.0–8.0)

## 2017-11-11 LAB — RENAL FUNCTION PANEL
ALBUMIN: 4.8 g/dL (ref 3.5–5.0)
Anion gap: 15 (ref 5–15)
BUN: 107 mg/dL — AB (ref 6–20)
CALCIUM: 7.5 mg/dL — AB (ref 8.9–10.3)
CO2: 14 mmol/L — ABNORMAL LOW (ref 22–32)
CREATININE: 4.88 mg/dL — AB (ref 0.61–1.24)
Chloride: 115 mmol/L — ABNORMAL HIGH (ref 101–111)
GFR calc Af Amer: 13 mL/min — ABNORMAL LOW (ref >60)
GFR calc non Af Amer: 11 mL/min — ABNORMAL LOW (ref >60)
GLUCOSE: 180 mg/dL — AB (ref 65–99)
Phosphorus: 5.4 mg/dL — ABNORMAL HIGH (ref 2.5–4.6)
Potassium: 4.9 mmol/L (ref 3.5–5.1)
SODIUM: 144 mmol/L (ref 135–145)

## 2017-11-11 LAB — CBC
HCT: 21.8 % — ABNORMAL LOW (ref 39.0–52.0)
Hemoglobin: 7 g/dL — ABNORMAL LOW (ref 13.0–17.0)
MCH: 23.6 pg — AB (ref 26.0–34.0)
MCHC: 32.1 g/dL (ref 30.0–36.0)
MCV: 73.6 fL — ABNORMAL LOW (ref 78.0–100.0)
PLATELETS: 48 10*3/uL — AB (ref 150–400)
RBC: 2.96 MIL/uL — ABNORMAL LOW (ref 4.22–5.81)
RDW: 14.9 % (ref 11.5–15.5)
WBC: 1.6 10*3/uL — ABNORMAL LOW (ref 4.0–10.5)

## 2017-11-11 LAB — GLUCOSE, CAPILLARY
GLUCOSE-CAPILLARY: 285 mg/dL — AB (ref 65–99)
GLUCOSE-CAPILLARY: 349 mg/dL — AB (ref 65–99)
Glucose-Capillary: 188 mg/dL — ABNORMAL HIGH (ref 65–99)
Glucose-Capillary: 367 mg/dL — ABNORMAL HIGH (ref 65–99)

## 2017-11-11 LAB — ANCA TITERS
Atypical P-ANCA titer: <1:20 {titer}
C-ANCA: <1:20 {titer}

## 2017-11-11 MED ORDER — CALCIUM CARBONATE ANTACID 500 MG PO CHEW: 2.0000 | CHEWABLE_TABLET | ORAL | Status: AC

## 2017-11-11 MED ORDER — SODIUM CHLORIDE 0.9 % IV SOLN
2.0000 g | Freq: Once | INTRAVENOUS | Status: DC
  Filled 2017-11-11: qty 20

## 2017-11-11 MED ORDER — DIPHENHYDRAMINE HCL 25 MG PO CAPS: 25.0000 mg | ORAL_CAPSULE | Freq: Four times a day (QID) | ORAL | Status: DC | PRN

## 2017-11-11 MED ORDER — EPOETIN ALFA 10000 UNIT/ML IJ SOLN
10000.0000 [IU] | INTRAMUSCULAR | Status: DC
  Filled 2017-11-11: qty 1

## 2017-11-11 MED ORDER — SODIUM CHLORIDE 0.9 % IV SOLN
2.0000 g | Freq: Once | INTRAVENOUS | Status: AC
  Administered 2017-11-11: 2 g via INTRAVENOUS
  Filled 2017-11-11: qty 20

## 2017-11-11 MED ORDER — SODIUM CHLORIDE 0.9 % IV SOLN
INTRAVENOUS | Status: AC
  Administered 2017-11-11 (×4): via INTRAVENOUS_CENTRAL
  Filled 2017-11-11 (×2): qty 200
  Filled 2017-11-11: qty 100
  Filled 2017-11-11 (×2): qty 200

## 2017-11-11 MED ORDER — ANTICOAGULANT SODIUM CITRATE 4% (200MG/5ML) IV SOLN
5.0000 mL | Freq: Once | Status: AC
  Administered 2017-11-11: 5 mL
  Filled 2017-11-11: qty 5

## 2017-11-11 MED ORDER — ACD FORMULA A 0.73-2.45-2.2 GM/100ML VI SOLN
500.0000 mL | Status: DC
  Administered 2017-11-11 (×2): 500 mL via INTRAVENOUS
  Filled 2017-11-11: qty 500

## 2017-11-11 MED ORDER — DARBEPOETIN ALFA 25 MCG/0.42ML IJ SOSY
25.0000 ug | PREFILLED_SYRINGE | INTRAMUSCULAR | Status: DC
  Administered 2017-11-11: 25 ug via SUBCUTANEOUS
  Filled 2017-11-11: qty 0.42

## 2017-11-11 MED ORDER — ACD FORMULA A 0.73-2.45-2.2 GM/100ML VI SOLN
Status: AC
  Administered 2017-11-11: 500 mL via INTRAVENOUS
  Filled 2017-11-11: qty 500

## 2017-11-11 NOTE — Progress Notes (Signed)
Patient ID: William [name], male   DOB: 03/18/1951, 67 y.o.   MRN: [id number] [facility] Progress Note   Assessment/ Plan:   1. 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  We will continue serial monitoring of labs to decide on need for dialysis as well as the safety of undertaking kidney biopsy for diagnostic/prognostic reasons.  I highly appreciate interventional radiology for placing his dialysis catheter. 2. Pancytopenia- seen by Hematology/Oncology with negative BM biopsy (4% plasma cells) 3. Cirrhosis- unclear etiology, coincidentally seen on CT scan which further might be the reason for his thrombocytopenia with concomitant hypersplenism noted. Hepatitis testing was negative. 4. Anemia of CKD stage 4-without overt loss, continue ESA while in hospital. 5. Metabolic acidosis : Started on oral sodium bicarbonate, continue to monitor levels  6. HTN- stable 7. DM per primary  Subjective:   Underwent plasmapheresis earlier this morning-next due tomorrow versus Thursday (will work out logistics with the dialysis unit for weekend coverage)   Objective:   BP 127/60 (BP Location: Right Arm)   Pulse 90   Temp 98.2 F (36.8 C) (Oral)   Resp 14   Ht 5' 5"  (1.651 m)   Wt 62.6 kg (138 lb 0.1 oz)   SpO2 98%   BMI 22.97 kg/m   Intake/Output Summary (Last 24 hours) at 11/11/2017 2229 Last data filed at 11/11/2017 0600 Gross per 24 hour  Intake 1500.83 ml  Output -  Net 1500.83 ml   Weight change:   Physical Exam: Gen: Comfortably sitting up in bed eating breakfast, son at bedside CVS: Pulse regular rhythm, normal rate, S1 and S2 with ejection systolic murmur Resp: Coarse rales right lung, left lung clear, no audible rhonchi  Abd: Soft, flat, nontender Ext: No  lower extremity edema  Imaging: Dg Chest Port 1 View  Result Date: 11/11/2017 CLINICAL DATA:  Hemoptysis EXAM: PORTABLE CHEST 1 VIEW COMPARISON:  November 06, 2017 chest radiograph and chest CT November 07, 2017 FINDINGS: Central catheter tip in superior vena cava. No pneumothorax. There is widespread interstitial and patchy alveolar opacities, similar to recent studies. There is cardiomegaly with mild pulmonary venous hypertension. There is aortic atherosclerosis. No adenopathy. No bone lesions. IMPRESSION: Stable widespread interstitial and alveolar opacities as noted on recent studies. Question atypical pneumonia versus hypersensitivity pneumonitis. A degree of pulmonary hemorrhage is possible. A somewhat atypical presentation of congestive heart failure is also possible. CT suggests a degree of underlying fibrosis. Stable changes of pulmonary vascular congestion. There is aortic atherosclerosis. Central catheter as described without pneumothorax. Aortic Atherosclerosis (ICD10-I70.0). Electronically Signed   By: Lowella Grip III M.D.   On: 11/11/2017 07:40    Labs: BMET Recent Labs  Lab 11/06/17 1700 11/07/17 0449 11/08/17 0310 11/09/17 0652 11/10/17 0412  NA 135 133* 135 139 139  K 4.6 5.0 4.9 5.3* 4.9  CL 106 103 105 107 108  CO2 17* 17* 16* 17* 17*  GLUCOSE 117* 212* 258* 176* 178*  BUN 105* 107* 117* 124* 131*  CREATININE 5.47* 5.57* 5.66* 5.70* 5.74*  CALCIUM 8.1* 7.8* 7.6* 7.8* 7.6*  PHOS  --   --  6.1* 6.2* 5.8*   CBC Recent Labs  Lab 11/06/17 1700 11/08/17 1328 11/09/17 0652 11/10/17 0412  WBC 3.5* 2.4* 2.5* 2.4*  NEUTROABS 2.4  --   --   --  HGB 8.0* 7.6* 7.5* 7.4*  HCT 25.3* 24.0* 23.2* 23.0*  MCV 74.0* 73.4* 73.9* 73.7*  PLT 66* 59* 53* 60*    Medications:    . amLODipine  10 mg Oral Daily  . carvedilol  12.5 mg Oral BID WC  . ciprofloxacin-dexamethasone  4 drop Right EAR BID  . epoetin alfa  10,000 Units Subcutaneous Q14 Days  . ferrous gluconate  324 mg  Oral Daily  . furosemide  40 mg Intravenous Daily  . insulin aspart  0-9 Units Subcutaneous TID WC  . insulin aspart protamine- aspart  20 Units Subcutaneous Q breakfast  . insulin aspart protamine- aspart  7 Units Subcutaneous Q supper  . multivitamin with minerals  1 tablet Oral Daily  . pantoprazole  40 mg Oral Daily  . [START ON 11/13/2017] predniSONE  60 mg Oral Q breakfast  . sodium bicarbonate  1,300 mg Oral BID  . sodium chloride flush  3 mL Intravenous Q12H   Elmarie Shiley, MD 11/11/2017, 8:07 AM

## 2017-11-11 NOTE — Progress Notes (Signed)
Patient going to plasmapheresis at this time.  Son by patient's side to translate.  No complaints voiced.  Safety and comfort maintained.

## 2017-11-11 NOTE — Progress Notes (Addendum)
Name: William [name] MRN: [id number] DOB: [birth year]    ADMISSION DATE:  11/06/2017 CONSULTATION DATE:  11/07/17  REFERRING MD:  Murray Hodgkins, MD  CHIEF COMPLAINT:  Cough and hemoptysis   Brief HPI:  68 year old with hypertension, diabetes, diastolic heart failure, cirrhosis, CKD evaluated in the pulmonary clinic for hemoptysis.  Also been seen by hematology for pancytopenia with bone marrow biopsy which is nondiagnostic Admitted for cough, ongoing hemoptysis, rapidly progressive kidney failure Previous serologies noted for mildly positive GBM antibody. He is an immigrant from [place] with previously treated schistosomia infection.  Non-smoker, used to work as a Administrator.  No known exposures.  Patient speaks arabic only.    SUBJECTIVE:  S/p first plasmapheresis treatment/ high dose steroids started yesterday Son at bedside states since treatment/steroids, no more bloody sputum and patient states he feels better, no longer becomes SOB with coughing episodes, otherwise, no complaints.  VITAL SIGNS: Temp:  [98 F (36.7 C)-98.9 F (37.2 C)] 98.2 F (36.8 C) (04/09 0751) Pulse Rate:  [72-90] 90 (04/09 0751) Resp:  [13-19] 14 (04/09 0751) BP: (110-161)/(55-99) 127/60 (04/09 0751) SpO2:  [96 %-100 %] 98 % (04/09 0751) Weight:  [138 lb 0.1 oz (62.6 kg)-143 lb 4.8 oz (65 kg)] 138 lb 0.1 oz (62.6 kg) (04/09 0250)  PHYSICAL EXAMINATION: General:  Older, thin appearing male sitting up in bed in NAD HEENT: MM pink/moist, PERRL Neuro: Awake, verbal, appropriate, f/c, MAE CV:rrr, no m/r/g PULM: even/non-labored, bibasilar crackles, dry cough GI: soft, non-tender, bsx4 active  Extremities: warm/dry, no edema  Skin: no rashes  Recent Labs  Lab 11/08/17 0310 11/09/17 0652 11/10/17 0412  NA 135 139 139  K 4.9 5.3* 4.9  CL 105 107 108  CO2 16* 17* 17*  BUN 117* 124* 131*  CREATININE 5.66* 5.70* 5.74*  GLUCOSE 258* 176* 178*   Recent Labs  Lab 11/08/17 1328 11/09/17 0652  11/10/17 0412  HGB 7.6* 7.5* 7.4*  HCT 24.0* 23.2* 23.0*  WBC 2.4* 2.5* 2.4*  PLT 59* 53* 60*   Dg Chest Port 1 View  Result Date: 11/11/2017 CLINICAL DATA:  Hemoptysis EXAM: PORTABLE CHEST 1 VIEW COMPARISON:  November 06, 2017 chest radiograph and chest CT November 07, 2017 FINDINGS: Central catheter tip in superior vena cava. No pneumothorax. There is widespread interstitial and patchy alveolar opacities, similar to recent studies. There is cardiomegaly with mild pulmonary venous hypertension. There is aortic atherosclerosis. No adenopathy. No bone lesions. IMPRESSION: Stable widespread interstitial and alveolar opacities as noted on recent studies. Question atypical pneumonia versus hypersensitivity pneumonitis. A degree of pulmonary hemorrhage is possible. A somewhat atypical presentation of congestive heart failure is also possible. CT suggests a degree of underlying fibrosis. Stable changes of pulmonary vascular congestion. There is aortic atherosclerosis. Central catheter as described without pneumothorax. Aortic Atherosclerosis (ICD10-I70.0). Electronically Signed   By: Lowella Grip III M.D.   On: 11/11/2017 07:40   4/5 HRCT >> 1. Patchy bilateral ground-glass with septal thickening, some of which is new in the right upper lobe. Associated areas of new architectural distortion. Findings may be due to a combination of pulmonary hemorrhage superimposed on evolving postinflammatory fibrosis. Chronic hypersensitivity pneumonitis is not excluded. 2. Small bilateral pleural effusions, left greater than right, decreased from prior. 3. Cirrhosis. 4. Spleen appears enlarged but is incompletely imaged. 5. Aortic atherosclerosis (ICD10-170.0). Coronary artery calcification  ASSESSMENT / PLAN: Patient is a 67 y.o male with HTN, DM, HFpEF, Cirrhosis, Pancytopenia, and CKD stage 4 who presented to the ED  on 4/04 with cough and hemoptysis that has gotten progressively worse over the past several years.  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 Including: Protein to creatinine ratio >4000, negative ANA, negative ANCA, negative hepatitis B and C, negative HIV, negative bone marrow biopsy (4% plast cells), and renal ultrasound showing increased echogenicity.  - TTE with preserved EF - immunofixation - normal - Positive Anti-GBM - 4/5 HRCT w/patchy bilateral GGOs with septal thickening, some new in RUL, improving small bilateral effusions L>R, enlarged spleen - R IJ HD placed with first treatment of plasmapheresis completed 4/8 - 4/9 CXR - widespread interstitial and alveolar opacities  ddx atypical pna/ HSP/ pulm hemorrhage - stable oxygenation on room air, hemodynamically stable  P:  UA pending to assess for micro hematuria Schistosomiasis serology  pending - needs to be sent, will speak to lab concerning this Plasmapheresis and high dose solumedrol per Renal  Renal biopsy not an option at this point given his thrombocytopenia Will hold on BAL for given symptomatic improvement since initiating plasmapheresis  Rest per primary.   Kennieth Rad, AGACNP-BC [facility] Pgr: [phone] or if no answer [phone] 11/11/2017, 11:08 AM '

## 2017-11-11 NOTE — Telephone Encounter (Signed)
Dr. Melvyn Novas, please advise if you are fine with pt keeping appt with Dr. Chase Caller for an appt on 5/8 at 9:15 for a hospital f/u appt.  appt was requested to be scheduled with MR by MR.

## 2017-11-11 NOTE — Telephone Encounter (Signed)
Fine with me

## 2017-11-11 NOTE — Progress Notes (Signed)
TPE tx completed w/o problem, blood rinsed back, report called to Lorenda Cahill, RN

## 2017-11-11 NOTE — Progress Notes (Signed)
Plasmapheresis complete.  Patient is of Muslim belief and heparin is a pork derivative.  Pork is not permitted as per patient's religious belief.  This was discussed and verified with son.  Tori, Dialysis nurse, placed 3.2 mls of sodium citrate in the hemodialysis catheter wells instead of heparin.  Son made aware of nurse not using heparin as per religious belief.

## 2017-11-11 NOTE — Progress Notes (Addendum)
PROGRESS NOTE    William [name]  WEX:[id number] DOB: 07-28-1951 DOA: 11/06/2017 PCP: Gildardo Pounds, NP   Brief Narrative: William [name] is a 67 y.o. man PMHchronic kidney disease stage IV, cirrhosis, pancytopenia, diabetes mellitus, diastolic heart failure, presented with chronic cough, mild shortness of breath, edema. Has had extensive workup by pulmonology in the outpatient setting, admitted for acute on chronic hypoxic respiratory failure, acute on chronic diastolic heart failure, acute kidney injury superimposed on CKD stage IV. 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 Good pasture syndrome; Getting IV solumedrol and plasmapheresis.  nephrologist contemplating renal biopsy during this admission.   2-Hemoptysis; cough better. No significant blood in the sputum, per son.   3-Pancytopenia; discussed with Dr Julien Nordmann, this is probably reactive. Treat support care, transfusion as needed , Neupogen as needed. Discussed with Dr Posey Pronto, will repeat labs in am, expect some improvement with steroids.  For anemia, he is on aranesp.  I will repeat labs in am, and will consider Blood transfusion and Neupogen depending on results.   4-Cirrhosis; seen on CT scan. Might explain thrombocytopenia.  Hepatitis panel negative one moth ago.   5-Metabolic acidosis; on oral bicarb tablet.   6-DM; type 2; 70/30;;20 units with breakfast and 7 units with supper.   7-HTN; on  norvasc.   DVT prophylaxis: scd Code Status: full code.  Family Communication: son at bedside.  Disposition Plan; home at time of discharge   Consultants:   Nephrology  CCM.   Oncology    Procedures:   Plasmapheresis.    Antimicrobials: none   Subjective: He is alert, feeling better per son. Son help with translation.  Cough improved. He is breathing well.   Objective: Vitals:   11/11/17 0500 11/11/17 0510 11/11/17 0751 11/11/17 1012  BP: (!) 124/59 124/60 127/60 139/64  Pulse: 84 85 90   Resp: 15 15 14    Temp:  98.1 F (36.7 C) 98.2 F (36.8 C)   TempSrc:  Oral Oral   SpO2: 97% 97% 98%   Weight:      Height:        Intake/Output Summary (Last 24 hours) at 11/11/2017 1336 Last data filed at 11/11/2017 0600 Gross per 24 hour  Intake 1500.83 ml  Output -  Net 1500.83 ml   Filed Weights   11/10/17 0900 11/11/17 0100 11/11/17 0250  Weight: 63.5 kg (139 lb 15.9 oz) 65 kg (143 lb 4.8 oz) 62.6 kg (138 lb 0.1 oz)    Examination:  General exam: Appears calm and comfortable  Respiratory system: Clear to auscultation. Respiratory effort normal. Cardiovascular system: S1 & S2 heard, RRR. No JVD, murmurs, rubs, gallops or clicks. No pedal edema. Gastrointestinal system: Abdomen is nondistended, soft and nontender. No organomegaly or masses felt. Normal bowel sounds heard. Central nervous system: Alert and oriented. No focal neurological deficits. Extremities: Symmetric 5 x 5 power. Skin: No rashes, lesions or ulcers     Data Reviewed: I have personally reviewed following labs  and imaging studies  CBC: Recent Labs  Lab 11/06/17 1700 11/08/17 1328 11/09/17 0652 11/10/17 0412 11/11/17 0719  WBC 3.5* 2.4* 2.5* 2.4* 1.6*  NEUTROABS 2.4  --   --   --   --   HGB 8.0* 7.6* 7.5* 7.4* 7.0*  HCT 25.3* 24.0* 23.2* 23.0* 21.8*  MCV 74.0* 73.4* 73.9* 73.7* 73.6*  PLT 66* 59* 53* 60* 48*   Basic Metabolic Panel: Recent Labs  Lab 11/07/17 0449 11/08/17 0310  11/09/17 0652 11/10/17 0412 11/11/17 0719  NA 133* 135 139 139 144  K 5.0 4.9 5.3* 4.9 4.9  CL 103 105 107 108 115*  CO2 17* 16* 17* 17* 14*  GLUCOSE 212* 258* 176* 178* 180*  BUN 107* 117* 124* 131* 107*  CREATININE 5.57* 5.66* 5.70* 5.74* 4.88*  CALCIUM 7.8* 7.6* 7.8* 7.6* 7.5*  PHOS  --  6.1* 6.2* 5.8* 5.4*   GFR: Estimated Creatinine Clearance: 13 mL/min (A) (by C-G formula based on SCr of 4.88 mg/dL (H)). Liver Function Tests: Recent Labs  Lab 11/08/17 0310 11/09/17 1700 11/10/17 0412 11/11/17 0719  ALBUMIN 2.4* 2.4* 2.4* 4.8   No results for input(s): LIPASE, AMYLASE in the last 168 hours. No results for input(s): AMMONIA in the last 168 hours. Coagulation Profile: Recent Labs  Lab 11/07/17 1547  INR 1.26   Cardiac Enzymes: No results for input(s): CKTOTAL, CKMB, CKMBINDEX, TROPONINI in the last 168 hours. BNP (last 3 results) Recent Labs    09/25/17 1614  PROBNP 380.0*   HbA1C: No results for input(s): HGBA1C in the last 72 hours. CBG: Recent Labs  Lab 11/10/17 1756 11/10/17 1853 11/10/17 2055 11/11/17 0725 11/11/17 1126  GLUCAP 67 80 97 188* 285*   Lipid Profile: No results for input(s): CHOL, HDL, LDLCALC, TRIG, CHOLHDL, LDLDIRECT in the last 72 hours. Thyroid Function Tests: No results for input(s): TSH, T4TOTAL, FREET4, T3FREE, THYROIDAB in the last 72 hours. Anemia Panel: No results for input(s): VITAMINB12, FOLATE, FERRITIN, TIBC, IRON, RETICCTPCT in the last 72 hours. Sepsis Labs: No results for input(s): PROCALCITON, LATICACIDVEN in the last 168 hours.  No results found for this or any previous visit (from the past 240 hour(s)).       Radiology Studies: Ir Fluoro Guide Cv Line Right  Result Date: 11/11/2017 CLINICAL DATA:  Renal failure requiring hemodialysis. EXAM: TUNNELED CENTRAL VENOUS HEMODIALYSIS CATHETER PLACEMENT WITH ULTRASOUND AND FLUOROSCOPIC GUIDANCE ANESTHESIA/SEDATION: 2.0 mg IV Versed; 100 mcg IV Fentanyl. Total  Moderate Sedation Time:   20 minutes. The patient's level of consciousness and physiologic status were continuously monitored during the procedure by Radiology nursing. MEDICATIONS: 2 g IV Ancef. FLUOROSCOPY TIME:  24 seconds.  1.5 mGy. PROCEDURE: The procedure, risks, benefits, and alternatives were explained to the patient. Questions regarding the procedure were encouraged and answered. The patient understands and consents to the procedure. A timeout was performed prior to initiating the procedure. The right neck and chest were prepped with chlorhexidine in a sterile fashion, and a sterile drape was applied covering the operative field. Maximum barrier sterile technique with sterile gowns and gloves were used for the procedure. Local anesthesia was provided with 1% lidocaine. Ultrasound was used to confirm patency of the right internal jugular vein. After creating a small venotomy incision, a 21 gauge needle was advanced into the right internal jugular vein under direct, real-time ultrasound guidance. Ultrasound image documentation was performed. After securing guidewire access, an 8 Fr dilator was placed. A J-wire was kinked to measure appropriate catheter length.  A Palindrome tunneled hemodialysis catheter measuring 19 cm from tip to cuff was chosen for placement. This was tunneled in a retrograde fashion from the chest wall to the venotomy incision. At the venotomy, serial dilatation was performed and a 16 Fr peel-away sheath was placed over a guidewire. The catheter was then placed through the sheath and the sheath removed. Final catheter positioning was confirmed and documented with a fluoroscopic spot image. The catheter was aspirated, flushed with saline, and injected with appropriate volume heparin dwells. The venotomy incision was closed with subcuticular 4-0 Vicryl. Dermabond was applied to the incision. The catheter exit site was secured with 0-Prolene retention sutures. COMPLICATIONS: None.  No  pneumothorax. FINDINGS: After catheter placement, the tip lies in the right atrium. The catheter aspirates normally and is ready for immediate use. IMPRESSION: Placement of tunneled hemodialysis catheter via the right internal jugular vein. The catheter tip lies in the right atrium. The catheter is ready for immediate use. Electronically Signed   By: Aletta Edouard M.D.   On: 11/11/2017 09:58   Ir US Guide Vasc Access Right  Result Date: 11/11/2017 CLINICAL DATA:  Renal failure requiring hemodialysis. EXAM: TUNNELED CENTRAL VENOUS HEMODIALYSIS CATHETER PLACEMENT WITH ULTRASOUND AND FLUOROSCOPIC GUIDANCE ANESTHESIA/SEDATION: 2.0 mg IV Versed; 100 mcg IV Fentanyl. Total Moderate Sedation Time:   20 minutes. The patient's level of consciousness and physiologic status were continuously monitored during the procedure by Radiology nursing. MEDICATIONS: 2 g IV Ancef. FLUOROSCOPY TIME:  24 seconds.  1.5 mGy. PROCEDURE: The procedure, risks, benefits, and alternatives were explained to the patient. Questions regarding the procedure were encouraged and answered. The patient understands and consents to the procedure. A timeout was performed prior to initiating the procedure. The right neck and chest were prepped with chlorhexidine in a sterile fashion, and a sterile drape was applied covering the operative field. Maximum barrier sterile technique with sterile gowns and gloves were used for the procedure. Local anesthesia was provided with 1% lidocaine. Ultrasound was used to confirm patency of the right internal jugular vein. After creating a small venotomy incision, a 21 gauge needle was advanced into the right internal jugular vein under direct, real-time ultrasound guidance. Ultrasound image documentation was performed. After securing guidewire access, an 8 Fr dilator was placed. A J-wire was kinked to measure appropriate catheter length. A Palindrome tunneled hemodialysis catheter measuring 19 cm from tip to cuff was  chosen for placement. This was tunneled in a retrograde fashion from the chest wall to the venotomy incision. At the venotomy, serial dilatation was performed and a 16 Fr peel-away sheath was placed over a guidewire. The catheter was then placed through the sheath and the sheath removed. Final catheter positioning was confirmed and documented with a fluoroscopic spot image. The catheter was aspirated, flushed with saline, and injected with appropriate volume heparin dwells. The venotomy incision was closed with subcuticular 4-0 Vicryl. Dermabond was applied to the incision. The catheter exit site was secured with 0-Prolene retention sutures. COMPLICATIONS: None.  No pneumothorax. FINDINGS: After catheter placement, the tip lies in the right atrium. The catheter aspirates normally and is ready for immediate use. IMPRESSION: Placement of tunneled hemodialysis catheter via the right internal jugular vein. The catheter tip lies in the right atrium. The catheter is ready for immediate use. Electronically Signed   By: Aletta Edouard M.D.   On: 11/11/2017 09:58   Dg Chest Port 1 View  Result Date: 11/11/2017 CLINICAL DATA:  Hemoptysis EXAM: PORTABLE CHEST 1 VIEW COMPARISON:  November 06, 2017 chest radiograph and chest CT November 07, 2017 FINDINGS: Central catheter tip in superior vena cava. No pneumothorax. There is widespread interstitial and patchy alveolar opacities, similar to recent studies. There is cardiomegaly with mild pulmonary venous hypertension. There is aortic atherosclerosis. No adenopathy. No bone lesions. IMPRESSION: Stable widespread interstitial and alveolar opacities as noted on recent studies. Question atypical pneumonia versus hypersensitivity pneumonitis. A degree of pulmonary hemorrhage is possible. A somewhat atypical presentation of congestive heart failure is also possible. CT suggests a degree of underlying fibrosis. Stable changes of pulmonary vascular congestion. There is aortic atherosclerosis.  Central catheter as described without pneumothorax. Aortic Atherosclerosis (ICD10-I70.0). Electronically Signed   By: Lowella Grip III M.D.   On: 11/11/2017 07:40        Scheduled Meds: . amLODipine  10 mg Oral Daily  . carvedilol  12.5 mg Oral BID WC  . ciprofloxacin-dexamethasone  4 drop Right EAR BID  . Darbepoetin Alfa  25 mcg Subcutaneous Q7 days  . ferrous gluconate  324 mg Oral Daily  . furosemide  40 mg Intravenous Daily  . insulin aspart  0-9 Units Subcutaneous TID WC  . insulin aspart protamine- aspart  20 Units Subcutaneous Q breakfast  . insulin aspart protamine- aspart  7 Units Subcutaneous Q supper  . multivitamin with minerals  1 tablet Oral Daily  . pantoprazole  40 mg Oral Daily  . [START ON 11/13/2017] predniSONE  60 mg Oral Q breakfast  . sodium bicarbonate  1,300 mg Oral BID  . sodium chloride flush  3 mL Intravenous Q12H   Continuous Infusions: . sodium chloride 250 mL (11/10/17 1555)  . calcium gluconate IVPB    . citrate dextrose 500 mL (11/11/17 0310)  . methylPREDNISolone (SOLU-MEDROL) injection Stopped (11/11/17 1108)     LOS: 4 days    Time spent: 35 minutes.     Elmarie Shiley, MD [facility] Pager [phone]  If 7PM-7AM, please contact night-coverage [email or web address] Password [email or web address] 11/11/2017, 1:36 PM

## 2017-11-12 DIAGNOSIS — N184 Chronic kidney disease, stage 4 (severe): Secondary | ICD-10-CM | POA: Diagnosis not present

## 2017-11-12 DIAGNOSIS — R05 Cough: Secondary | ICD-10-CM | POA: Diagnosis not present

## 2017-11-12 DIAGNOSIS — I5033 Acute on chronic diastolic (congestive) heart failure: Secondary | ICD-10-CM | POA: Diagnosis not present

## 2017-11-12 DIAGNOSIS — J9621 Acute and chronic respiratory failure with hypoxia: Secondary | ICD-10-CM | POA: Diagnosis not present

## 2017-11-12 DIAGNOSIS — Z4901 Encounter for fitting and adjustment of extracorporeal dialysis catheter: Secondary | ICD-10-CM | POA: Diagnosis not present

## 2017-11-12 DIAGNOSIS — I129 Hypertensive chronic kidney disease with stage 1 through stage 4 chronic kidney disease, or unspecified chronic kidney disease: Secondary | ICD-10-CM | POA: Diagnosis not present

## 2017-11-12 DIAGNOSIS — E1122 Type 2 diabetes mellitus with diabetic chronic kidney disease: Secondary | ICD-10-CM | POA: Diagnosis not present

## 2017-11-12 DIAGNOSIS — N179 Acute kidney failure, unspecified: Secondary | ICD-10-CM | POA: Diagnosis not present

## 2017-11-12 DIAGNOSIS — I130 Hypertensive heart and chronic kidney disease with heart failure and stage 1 through stage 4 chronic kidney disease, or unspecified chronic kidney disease: Secondary | ICD-10-CM | POA: Diagnosis not present

## 2017-11-12 DIAGNOSIS — R042 Hemoptysis: Secondary | ICD-10-CM | POA: Diagnosis not present

## 2017-11-12 DIAGNOSIS — D61818 Other pancytopenia: Secondary | ICD-10-CM | POA: Diagnosis not present

## 2017-11-12 DIAGNOSIS — Z23 Encounter for immunization: Secondary | ICD-10-CM | POA: Diagnosis not present

## 2017-11-12 LAB — RENAL FUNCTION PANEL
ALBUMIN: 3.9 g/dL (ref 3.5–5.0)
Anion gap: 13 (ref 5–15)
BUN: 123 mg/dL — AB (ref 6–20)
CO2: 17 mmol/L — ABNORMAL LOW (ref 22–32)
Calcium: 7.7 mg/dL — ABNORMAL LOW (ref 8.9–10.3)
Chloride: 109 mmol/L (ref 101–111)
Creatinine, Ser: 5.12 mg/dL — ABNORMAL HIGH (ref 0.61–1.24)
GFR calc Af Amer: 12 mL/min — ABNORMAL LOW (ref >60)
GFR, EST NON AFRICAN AMERICAN: 11 mL/min — AB (ref >60)
Glucose, Bld: 458 mg/dL — ABNORMAL HIGH (ref 65–99)
PHOSPHORUS: 5.9 mg/dL — AB (ref 2.5–4.6)
Potassium: 5.6 mmol/L — ABNORMAL HIGH (ref 3.5–5.1)
Sodium: 139 mmol/L (ref 135–145)

## 2017-11-12 LAB — CBC WITH DIFFERENTIAL/PLATELET
BASOS PCT: 0 %
Basophils Absolute: 0 10*3/uL (ref 0.0–0.1)
EOS ABS: 0 10*3/uL (ref 0.0–0.7)
EOS PCT: 0 %
HCT: 21.2 % — ABNORMAL LOW (ref 39.0–52.0)
Hemoglobin: 6.7 g/dL — CL (ref 13.0–17.0)
Lymphocytes Relative: 13 %
Lymphs Abs: 0.3 10*3/uL — ABNORMAL LOW (ref 0.7–4.0)
MCH: 23.7 pg — ABNORMAL LOW (ref 26.0–34.0)
MCHC: 32.5 g/dL (ref 30.0–36.0)
MCV: 72.9 fL — ABNORMAL LOW (ref 78.0–100.0)
Monocytes Absolute: 0 10*3/uL — ABNORMAL LOW (ref 0.1–1.0)
Monocytes Relative: 2 %
Neutro Abs: 2.2 10*3/uL (ref 1.7–7.7)
Neutrophils Relative %: 85 %
PLATELETS: 51 10*3/uL — AB (ref 150–400)
RBC: 2.91 MIL/uL — AB (ref 4.22–5.81)
RDW: 15.2 % (ref 11.5–15.5)
WBC: 2.6 10*3/uL — AB (ref 4.0–10.5)

## 2017-11-12 LAB — POCT I-STAT, CHEM 8
BUN: 106 mg/dL — ABNORMAL HIGH (ref 6–20)
CALCIUM ION: 0.8 mmol/L — AB (ref 1.15–1.40)
CREATININE: 5.4 mg/dL — AB (ref 0.61–1.24)
Chloride: 107 mmol/L (ref 101–111)
GLUCOSE: 536 mg/dL — AB (ref 65–99)
HCT: 23 % — ABNORMAL LOW (ref 39.0–52.0)
Hemoglobin: 7.8 g/dL — ABNORMAL LOW (ref 13.0–17.0)
Potassium: 4.7 mmol/L (ref 3.5–5.1)
Sodium: 142 mmol/L (ref 135–145)
TCO2: 16 mmol/L — ABNORMAL LOW (ref 22–32)

## 2017-11-12 LAB — PREPARE RBC (CROSSMATCH)

## 2017-11-12 LAB — GLUCOSE, CAPILLARY
GLUCOSE-CAPILLARY: 521 mg/dL — AB (ref 65–99)
GLUCOSE-CAPILLARY: 184 mg/dL — AB (ref 65–99)
GLUCOSE-CAPILLARY: 270 mg/dL — AB (ref 65–99)
Glucose-Capillary: 206 mg/dL — ABNORMAL HIGH (ref 65–99)
Glucose-Capillary: 281 mg/dL — ABNORMAL HIGH (ref 65–99)

## 2017-11-12 LAB — ABO/RH: ABO/RH(D): O POS

## 2017-11-12 MED ORDER — SODIUM CHLORIDE 0.9 % IV SOLN
Freq: Once | INTRAVENOUS | Status: DC
  Filled 2017-11-12: qty 200

## 2017-11-12 MED ORDER — INSULIN ASPART PROT & ASPART (70-30 MIX) 100 UNIT/ML ~~LOC~~ SUSP
15.0000 [IU] | Freq: Every day | SUBCUTANEOUS | Status: DC
  Administered 2017-11-12: 15 [IU] via SUBCUTANEOUS

## 2017-11-12 MED ORDER — DIPHENHYDRAMINE HCL 25 MG PO CAPS: 25.0000 mg | ORAL_CAPSULE | Freq: Four times a day (QID) | ORAL | Status: DC | PRN

## 2017-11-12 MED ORDER — HYDROCOD POLST-CPM POLST ER 10-8 MG/5ML PO SUER
2.5000 mL | Freq: Two times a day (BID) | ORAL | Status: DC | PRN
  Administered 2017-11-12: 2.5 mL via ORAL
  Filled 2017-11-12: qty 5

## 2017-11-12 MED ORDER — SODIUM CHLORIDE 0.9 % IV SOLN
2.0000 g | Freq: Once | INTRAVENOUS | Status: AC
  Administered 2017-11-12: 2 g via INTRAVENOUS
  Filled 2017-11-12: qty 20

## 2017-11-12 MED ORDER — ACD FORMULA A 0.73-2.45-2.2 GM/100ML VI SOLN: 500.0000 mL | Status: DC

## 2017-11-12 MED ORDER — SODIUM CHLORIDE 0.9 % IV SOLN: Freq: Once | INTRAVENOUS | Status: DC

## 2017-11-12 MED ORDER — SODIUM CHLORIDE 0.9 % IV SOLN
INTRAVENOUS | Status: AC
  Administered 2017-11-12 (×4): via INTRAVENOUS_CENTRAL
  Filled 2017-11-12 (×5): qty 200

## 2017-11-12 MED ORDER — HYDROCOD POLST-CPM POLST ER 10-8 MG/5ML PO SUER: 2.5000 mL | Freq: Two times a day (BID) | ORAL | Status: DC | PRN

## 2017-11-12 MED ORDER — ANTICOAGULANT SODIUM CITRATE 4% (200MG/5ML) IV SOLN
5.0000 mL | Freq: Once | Status: AC
  Administered 2017-11-12: 5 mL
  Filled 2017-11-12: qty 5

## 2017-11-12 MED ORDER — ACD FORMULA A 0.73-2.45-2.2 GM/100ML VI SOLN
Status: AC
  Filled 2017-11-12: qty 500

## 2017-11-12 MED ORDER — ACETAMINOPHEN 325 MG PO TABS: 650.0000 mg | ORAL_TABLET | ORAL | Status: DC | PRN

## 2017-11-12 MED ORDER — SODIUM POLYSTYRENE SULFONATE 15 GM/60ML PO SUSP
15.0000 g | Freq: Once | ORAL | Status: DC
  Filled 2017-11-12: qty 60

## 2017-11-12 MED ORDER — CALCIUM CARBONATE ANTACID 500 MG PO CHEW
CHEWABLE_TABLET | ORAL | Status: AC
  Administered 2017-11-12: 400 mg via ORAL
  Filled 2017-11-12: qty 2

## 2017-11-12 MED ORDER — DESMOPRESSIN ACETATE 4 MCG/ML IJ SOLN
20.0000 ug | INTRAMUSCULAR | Status: AC
Start: 2017-11-12 — End: 2017-11-12
  Administered 2017-11-12: 20 ug via INTRAVENOUS
  Filled 2017-11-12: qty 5

## 2017-11-12 MED ORDER — INSULIN ASPART PROT & ASPART (70-30 MIX) 100 UNIT/ML ~~LOC~~ SUSP: 10.0000 [IU] | Freq: Every day | SUBCUTANEOUS | Status: DC

## 2017-11-12 MED ORDER — CALCIUM CARBONATE ANTACID 500 MG PO CHEW
2.0000 | CHEWABLE_TABLET | ORAL | Status: AC
  Administered 2017-11-12: 400 mg via ORAL

## 2017-11-12 MED ORDER — ALBUTEROL SULFATE (2.5 MG/3ML) 0.083% IN NEBU
5.0000 mg | INHALATION_SOLUTION | Freq: Four times a day (QID) | RESPIRATORY_TRACT | Status: DC
  Administered 2017-11-12 (×2): 5 mg via RESPIRATORY_TRACT
  Filled 2017-11-12 (×3): qty 6

## 2017-11-12 MED ORDER — ALBUTEROL SULFATE (2.5 MG/3ML) 0.083% IN NEBU
5.0000 mg | INHALATION_SOLUTION | Freq: Two times a day (BID) | RESPIRATORY_TRACT | Status: DC
  Administered 2017-11-13: 5 mg via RESPIRATORY_TRACT
  Filled 2017-11-12: qty 6

## 2017-11-12 MED ORDER — SODIUM CHLORIDE 0.9 % IV SOLN
INTRAVENOUS | Status: DC
  Filled 2017-11-12 (×4): qty 200

## 2017-11-12 NOTE — H&P (Signed)
Chief Complaint: Acute on Chronic Kidney injury  Referring Physician(s): Elmarie Shiley  Supervising Physician: Jacqulynn Cadet  Patient Status: [facility] - In-pt  History of Present Illness: William [name] is a 67 y.o. male with acute on chronic kidney injury which is rapidly progressing.  He initially presented to [facility] ED on 11/06/2017 c/o chronic cough with episodes of mild hemoptysis.  He also c/o worsening lower extremity edema.  On admission his creatinine was 5.47 and he was pancytopenic.  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  His temporary hemodialysis catheter was placed by Dr. Kathlene Cote on 11/11/2017.  We are now asked to perform a random renal biopsy.  Past Medical History:  Diagnosis Date  . Cirrhosis ([place])   . CKD (chronic kidney disease), stage IV ([place])   . Diabetes mellitus without complication ([place])   . Grade I diastolic dysfunction 06/01/2535  . History of anemia due to chronic kidney disease   . Hypertension   . Pancytopenia ([place]) 08/20/2017    Past Surgical History:  Procedure Laterality Date  . EYE SURGERY Bilateral    Cataract  . IR FLUORO GUIDE CV LINE RIGHT  11/10/2017  . IR US GUIDE VASC ACCESS RIGHT  11/10/2017  . NECK SURGERY      Allergies: Heparin  Medications: Prior to Admission medications   Medication Sig Start Date End Date Taking? Authorizing Provider  amLODipine (NORVASC) 10 MG tablet Take 1 tablet (10 mg total) daily by mouth. 06/20/17 11/06/24 Yes Gildardo Pounds, NP  amoxicillin (AMOXIL) 875 MG tablet Take 875 mg by mouth 2 (two) times daily. 10/31/17  Yes [provider]  carvedilol (COREG) 12.5 MG tablet Take 12.5 mg by mouth 2 (two) times daily with a meal.   Yes [provider]  ferrous gluconate (IRON 27) 240 (27 FE) MG tablet Take 1 tablet by mouth daily.   Yes [provider]  furosemide (LASIX) 20 MG  tablet Take 40 mg by mouth daily.    Yes [provider]  insulin NPH-insulin regular (NOVOLIN 70/30) (70-30) 100 UNIT/ML injection Inject 10-30 Units into the skin See admin instructions. 30 units in am and 10 units at night    Yes [provider]  Multiple Vitamins-Minerals (MULTIVITAMIN WITH MINERALS) tablet Take 1 tablet by mouth daily.   Yes [provider]  neomycin-polymyxin-hydrocortisone (CORTISPORIN) 3.5-10000-1 OTIC suspension Place 4 drops into the right ear 3 (three) times daily. 11/03/17  Yes [provider]  pantoprazole (PROTONIX) 40 MG tablet Take 1 tablet (40 mg total) by mouth daily. Take 30-60 min before first meal of the day 10/13/17  Yes Tanda Rockers, MD     Family History  Problem Relation Age of Onset  . Diabetes Mellitus II Sister   . Stroke Brother     Social History   Socioeconomic History  . Marital status: Single    Spouse name: Not on file  . Number of children: Not on file  . Years of education: Not on file  . Highest education level: Not on file  Occupational History  . Not on file  Social Needs  . Financial resource strain: Not on file  . Food insecurity:    Worry: Not on file    Inability: Not on file  . Transportation needs:    Medical: Not on file    Non-medical: Not on file  Tobacco Use  . Smoking status: Never Smoker  .  Smokeless tobacco: Never Used  Substance and Sexual Activity  . Alcohol use: No  . Drug use: Not Currently  . Sexual activity: Not on file  Lifestyle  . Physical activity:    Days per week: Not on file    Minutes per session: Not on file  . Stress: Not on file  Relationships  . Social connections:    Talks on phone: Not on file    Gets together: Not on file    Attends religious service: Not on file    Active member of club or organization: Not on file    Attends meetings of clubs or organizations: Not on file    Relationship status: Not on file  Other Topics Concern  . Not on  file  Social History Narrative  . Not on file   Review of Systems  Unable to perform ROS: Other  Language barrier  Vital Signs: BP 124/63   Pulse 86   Temp 98.1 F (36.7 C) (Oral)   Resp 19   Ht 5' 5"  (1.651 m)   Wt 142 lb 14.4 oz (64.8 kg)   SpO2 97%   BMI 23.78 kg/m   Physical Exam  HENT:  Head: Normocephalic and atraumatic.  Eyes: EOM are normal.  Neck: Normal range of motion.  Cardiovascular: Normal rate and regular rhythm.  Murmur heard. Pulmonary/Chest: Effort normal.  Coarse breath sounds  Abdominal: He exhibits no distension.  Musculoskeletal: Normal range of motion.  Neurological: He is alert.  Skin: Skin is warm and dry.  Vitals reviewed.   Imaging: Dg Chest 2 View  Result Date: 11/06/2017 CLINICAL DATA:  Cough, coughing up blood EXAM: CHEST - 2 VIEW COMPARISON:  09/18/2017 FINDINGS: Diffuse bilateral interstitial and alveolar airspace opacities. No pleural effusion or pneumothorax. Stable cardiomediastinal silhouette. No aggressive osseous lesion. IMPRESSION: Bilateral interstitial and alveolar airspace opacities. Differential diagnosis includes pulmonary edema versus multi lobar pneumonia. Electronically Signed   By: Kathreen Devoid   On: 11/06/2017 18:35   Ct Chest High Resolution  Result Date: 11/10/2017 CLINICAL DATA:  Cough, ongoing hemoptysis, rapidly progressive kidney failure. Shortness of breath. EXAM: CT CHEST WITHOUT CONTRAST TECHNIQUE: Multidetector CT imaging of the chest was performed following the standard protocol without intravenous contrast. High resolution imaging of the lungs, as well as inspiratory and expiratory imaging, was performed. COMPARISON:  09/17/2017. FINDINGS: Cardiovascular: Atherosclerotic calcification of the arterial vasculature, including coronary arteries. Heart is enlarged. No pericardial effusion. Mediastinum/Nodes: Mediastinal lymph nodes are not enlarged by CT size criteria. Hilar regions are difficult to evaluate without IV  contrast. No axillary adenopathy. Esophagus is grossly unremarkable. Distal periesophageal lymph nodes are not enlarged by CT size criteria. Lungs/Pleura: Patchy bilateral ground-glass with septal thickening, some of which is new in the right upper lobe. Associated mild architectural distortion, peribronchial thickening and bronchiectasis in the lower lobes. Mild peribronchovascular consolidation in both lower lobes as well. Tiny right pleural effusion, decreased. Small left pleural effusion, also decreased. Upper Abdomen: Liver margin is irregular. Visualized portions of the liver, gallbladder, adrenal glands and right kidney are otherwise grossly unremarkable. Spleen appears enlarged but is incompletely imaged. Visualized portions of the stomach and bowel are grossly unremarkable. Upper abdominal lymph nodes are not enlarged by CT size criteria. Musculoskeletal: No worrisome lytic or sclerotic lesions. IMPRESSION: 1. Patchy bilateral ground-glass with septal thickening, some of which is new in the right upper lobe. Associated areas of new architectural distortion. Findings may be due to a combination of pulmonary hemorrhage superimposed on  evolving postinflammatory fibrosis. Chronic hypersensitivity pneumonitis is not excluded. 2. Small bilateral pleural effusions, left greater than right, decreased from prior. 3. Cirrhosis. 4. Spleen appears enlarged but is incompletely imaged. 5. Aortic atherosclerosis (ICD10-170.0). Coronary artery calcification. Electronically Signed   By: Lorin Picket M.D.   On: 11/10/2017 09:57   Ir Fluoro Guide Cv Line Right  Result Date: 11/11/2017 CLINICAL DATA:  Renal failure requiring hemodialysis. EXAM: TUNNELED CENTRAL VENOUS HEMODIALYSIS CATHETER PLACEMENT WITH ULTRASOUND AND FLUOROSCOPIC GUIDANCE ANESTHESIA/SEDATION: 2.0 mg IV Versed; 100 mcg IV Fentanyl. Total Moderate Sedation Time:   20 minutes. The patient's level of consciousness and physiologic status were continuously  monitored during the procedure by Radiology nursing. MEDICATIONS: 2 g IV Ancef. FLUOROSCOPY TIME:  24 seconds.  1.5 mGy. PROCEDURE: The procedure, risks, benefits, and alternatives were explained to the patient. Questions regarding the procedure were encouraged and answered. The patient understands and consents to the procedure. A timeout was performed prior to initiating the procedure. The right neck and chest were prepped with chlorhexidine in a sterile fashion, and a sterile drape was applied covering the operative field. Maximum barrier sterile technique with sterile gowns and gloves were used for the procedure. Local anesthesia was provided with 1% lidocaine. Ultrasound was used to confirm patency of the right internal jugular vein. After creating a small venotomy incision, a 21 gauge needle was advanced into the right internal jugular vein under direct, real-time ultrasound guidance. Ultrasound image documentation was performed. After securing guidewire access, an 8 Fr dilator was placed. A J-wire was kinked to measure appropriate catheter length. A Palindrome tunneled hemodialysis catheter measuring 19 cm from tip to cuff was chosen for placement. This was tunneled in a retrograde fashion from the chest wall to the venotomy incision. At the venotomy, serial dilatation was performed and a 16 Fr peel-away sheath was placed over a guidewire. The catheter was then placed through the sheath and the sheath removed. Final catheter positioning was confirmed and documented with a fluoroscopic spot image. The catheter was aspirated, flushed with saline, and injected with appropriate volume heparin dwells. The venotomy incision was closed with subcuticular 4-0 Vicryl. Dermabond was applied to the incision. The catheter exit site was secured with 0-Prolene retention sutures. COMPLICATIONS: None.  No pneumothorax. FINDINGS: After catheter placement, the tip lies in the right atrium. The catheter aspirates normally and is  ready for immediate use. IMPRESSION: Placement of tunneled hemodialysis catheter via the right internal jugular vein. The catheter tip lies in the right atrium. The catheter is ready for immediate use. Electronically Signed   By: Aletta Edouard M.D.   On: 11/11/2017 09:58   Ir US Guide Vasc Access Right  Result Date: 11/11/2017 CLINICAL DATA:  Renal failure requiring hemodialysis. EXAM: TUNNELED CENTRAL VENOUS HEMODIALYSIS CATHETER PLACEMENT WITH ULTRASOUND AND FLUOROSCOPIC GUIDANCE ANESTHESIA/SEDATION: 2.0 mg IV Versed; 100 mcg IV Fentanyl. Total Moderate Sedation Time:   20 minutes. The patient's level of consciousness and physiologic status were continuously monitored during the procedure by Radiology nursing. MEDICATIONS: 2 g IV Ancef. FLUOROSCOPY TIME:  24 seconds.  1.5 mGy. PROCEDURE: The procedure, risks, benefits, and alternatives were explained to the patient. Questions regarding the procedure were encouraged and answered. The patient understands and consents to the procedure. A timeout was performed prior to initiating the procedure. The right neck and chest were prepped with chlorhexidine in a sterile fashion, and a sterile drape was applied covering the operative field. Maximum barrier sterile technique with sterile gowns and gloves were used  for the procedure. Local anesthesia was provided with 1% lidocaine. Ultrasound was used to confirm patency of the right internal jugular vein. After creating a small venotomy incision, a 21 gauge needle was advanced into the right internal jugular vein under direct, real-time ultrasound guidance. Ultrasound image documentation was performed. After securing guidewire access, an 8 Fr dilator was placed. A J-wire was kinked to measure appropriate catheter length. A Palindrome tunneled hemodialysis catheter measuring 19 cm from tip to cuff was chosen for placement. This was tunneled in a retrograde fashion from the chest wall to the venotomy incision. At the  venotomy, serial dilatation was performed and a 16 Fr peel-away sheath was placed over a guidewire. The catheter was then placed through the sheath and the sheath removed. Final catheter positioning was confirmed and documented with a fluoroscopic spot image. The catheter was aspirated, flushed with saline, and injected with appropriate volume heparin dwells. The venotomy incision was closed with subcuticular 4-0 Vicryl. Dermabond was applied to the incision. The catheter exit site was secured with 0-Prolene retention sutures. COMPLICATIONS: None.  No pneumothorax. FINDINGS: After catheter placement, the tip lies in the right atrium. The catheter aspirates normally and is ready for immediate use. IMPRESSION: Placement of tunneled hemodialysis catheter via the right internal jugular vein. The catheter tip lies in the right atrium. The catheter is ready for immediate use. Electronically Signed   By: Aletta Edouard M.D.   On: 11/11/2017 09:58   Dg Chest Port 1 View  Result Date: 11/11/2017 CLINICAL DATA:  Hemoptysis EXAM: PORTABLE CHEST 1 VIEW COMPARISON:  November 06, 2017 chest radiograph and chest CT November 07, 2017 FINDINGS: Central catheter tip in superior vena cava. No pneumothorax. There is widespread interstitial and patchy alveolar opacities, similar to recent studies. There is cardiomegaly with mild pulmonary venous hypertension. There is aortic atherosclerosis. No adenopathy. No bone lesions. IMPRESSION: Stable widespread interstitial and alveolar opacities as noted on recent studies. Question atypical pneumonia versus hypersensitivity pneumonitis. A degree of pulmonary hemorrhage is possible. A somewhat atypical presentation of congestive heart failure is also possible. CT suggests a degree of underlying fibrosis. Stable changes of pulmonary vascular congestion. There is aortic atherosclerosis. Central catheter as described without pneumothorax. Aortic Atherosclerosis (ICD10-I70.0). Electronically Signed    By: Lowella Grip III M.D.   On: 11/11/2017 07:40    Labs:  CBC: Recent Labs    11/09/17 0652 11/10/17 0412 11/11/17 0719 11/12/17 0258 11/12/17 0825  WBC 2.5* 2.4* 1.6* 2.6*  --   HGB 7.5* 7.4* 7.0* 6.7* 7.8*  HCT 23.2* 23.0* 21.8* 21.2* 23.0*  PLT 53* 60* 48* 51*  --     COAGS: Recent Labs    11/07/17 1547  INR 1.26  APTT 39*    BMP: Recent Labs    11/09/17 0652 11/10/17 0412 11/11/17 0719 11/12/17 0258 11/12/17 0825  NA 139 139 144 139 142  K 5.3* 4.9 4.9 5.6* 4.7  CL 107 108 115* 109 107  CO2 17* 17* 14* 17*  --   GLUCOSE 176* 178* 180* 458* 536*  BUN 124* 131* 107* 123* 106*  CALCIUM 7.8* 7.6* 7.5* 7.7*  --   CREATININE 5.70* 5.74* 4.88* 5.12* 5.40*  GFRNONAA 9* 9* 11* 11*  --   GFRAA 11* 11* 13* 12*  --     LIVER FUNCTION TESTS: Recent Labs    06/20/17 1613  09/03/17 1255 09/15/17 0856  11/09/17 6468 11/10/17 0412 11/11/17 0719 11/12/17 0258  BILITOT 0.4  --  0.6  0.6  --   --   --   --   --   AST 20  --  24 22  --   --   --   --   --   ALT 10  --  10 10  --   --   --   --   --   ALKPHOS 125*  --  108 101  --   --   --   --   --   PROT 6.6  --  7.0 6.9  --   --   --   --   --   ALBUMIN 3.3*   < > 3.1* 3.0*   < > 2.4* 2.4* 4.8 3.9   < > = values in this interval not displayed.    TUMOR MARKERS: No results for input(s): AFPTM, CEA, CA199, CHROMGRNA in the last 8760 hours.  Assessment and Plan:  Acute on chronic renal failure  Will proceed with image guided random renal biopsy tomorrow.  Will check platelets again in am and infuse if <50.  Interpreter used = Risks and benefits discussed with the patient including, but not limited to bleeding, infection, damage to adjacent structures or low yield requiring additional tests.  All of the patient's questions were answered, patient is agreeable to proceed. Consent signed and in chart.  Thank you for this interesting consult.  I greatly enjoyed meeting Nhia Roberti and look forward to  participating in their care.  A copy of this report was sent to the requesting provider on this date.  Electronically Signed: Murrell Redden, PA-C 11/12/2017, 10:17 AM   I spent a total of  25 Minutes in face to face in clinical consultation, greater than 50% of which was counseling/coordinating care for random renal biopsy.

## 2017-11-12 NOTE — Progress Notes (Signed)
Pt received 3 units of PRBCs in hemodialysis; tolerated transfusion well; no s/s of a reation noted; also checked his CBG=206. Pt also tolerated the therapeutic plasma exchange.

## 2017-11-12 NOTE — Progress Notes (Signed)
PROGRESS NOTE    William [name]  CLE:[id number] DOB: 28-Jun-1951 DOA: 11/06/2017 PCP: Gildardo Pounds, NP   Brief Narrative: William [name] is a 67 y.o. man PMHchronic kidney disease stage IV, cirrhosis, pancytopenia, diabetes mellitus, diastolic heart failure, presented with chronic cough, mild shortness of breath, edema. Has had extensive workup by pulmonology in the outpatient setting, admitted for acute on chronic hypoxic respiratory failure, acute on chronic diastolic heart failure, acute kidney injury superimposed on CKD stage IV. Concern for Goodpasture's disease    Assessment & Plan:   Principal Problem:   Acute renal failure superimposed on stage 4 chronic kidney disease (HCC) Active Problems:   Type II diabetes mellitus with renal manifestations (HCC)   Acute on chronic respiratory failure with hypoxia (HCC)   Acute on chronic diastolic CHF (congestive heart failure) (HCC)   Pancytopenia (HCC)   AKI (acute kidney injury) ([place])   Acute hypoxemic respiratory failure (HCC)   Hemoptysis   1-Acute Renal failure; on chronic kidney diseases stage III;  Vs rapidly progressive glomerulonephritis. Good pasture syndrome; Getting IV solumedrol and plasmapheresis.  nephrologist contemplating renal biopsy during this admission.  Started HD today.  Getting plasmapheresis.   2-Hemoptysis; no blood in the sputum today.   3-Pancytopenia; discussed with Dr Julien Nordmann, this is probably reactive. Treat support care, transfusion as needed , Neupogen as needed. Discussed with Dr Posey Pronto, will repeat labs in am, expect some improvement with steroids.  For anemia, he is on aranesp.  He received 3 units PRBC during HD today  WBC improved today,. normal ANC.  Will check LFT in am.   4-Cirrhosis; seen on CT scan. Might explain thrombocytopenia.  Hepatitis panel negative one moth ago.   5-Metabolic acidosis; on oral bicarb tablet.   6-DM; type 2; 70/30;;increase NPH night coverage to 15 units.    SSI.    7-HTN; on norvasc.   DVT prophylaxis: scd Code Status: full code.  Family Communication: son at bedside.  Disposition Plan; home at time of discharge   Consultants:   Nephrology  CCM.   Oncology    Procedures:   Plasmapheresis.    Antimicrobials: none   Subjective: He just came from HD. Having coughing spell.  Denies dyspnea. Report mild nausea.   Objective: Vitals:   11/12/17 1305 11/12/17 1320 11/12/17 1330 11/12/17 1345  BP: (!) 179/90 (!) 179/91 (!) 176/89 (!) 171/86  Pulse: 90 90 91 91  Resp: 19 19 19 20   Temp: 98.1 F (36.7 C) 98.1 F (36.7 C)    TempSrc:   Oral Oral  SpO2: 96% 96% 96% 95%  Weight:      Height:        Intake/Output Summary (Last 24 hours) at 11/12/2017 1407 Last data filed at 11/12/2017 1345 Gross per 24 hour  Intake 1363 ml  Output 919 ml  Net 444 ml   Filed Weights   11/11/17 0100 11/11/17 0250 11/12/17 0500  Weight: 65 kg (143 lb 4.8 oz) 62.6 kg (138 lb 0.1 oz) 64.8 kg (142 lb 14.4 oz)    Examination:  General exam: NAD, coughing.  Respiratory system: Bilateral ronchus.  Cardiovascular system: S 1, S 2 RRR Gastrointestinal system: BS present, soft, nt Central nervous system; non focal.  Extremities: Symmetric power.  Skin: no rash      Data Reviewed: I have personally reviewed following labs and imaging studies  CBC: Recent Labs  Lab 11/06/17 1700 11/08/17 1328 11/09/17 0652 11/10/17 0412 11/11/17 0719 11/12/17 0258 11/12/17 0825  WBC  3.5* 2.4* 2.5* 2.4* 1.6* 2.6*  --   NEUTROABS 2.4  --   --   --   --  2.2  --   HGB 8.0* 7.6* 7.5* 7.4* 7.0* 6.7* 7.8*  HCT 25.3* 24.0* 23.2* 23.0* 21.8* 21.2* 23.0*  MCV 74.0* 73.4* 73.9* 73.7* 73.6* 72.9*  --   PLT 66* 59* 53* 60* 48* 51*  --    Basic Metabolic Panel: Recent Labs  Lab 11/08/17 0310 11/09/17 0652 11/10/17 0412 11/11/17 0719 11/12/17 0258 11/12/17 0825  NA 135 139 139 144 139 142  K 4.9 5.3* 4.9 4.9 5.6* 4.7  CL 105 107 108 115* 109 107   CO2 16* 17* 17* 14* 17*  --   GLUCOSE 258* 176* 178* 180* 458* 536*  BUN 117* 124* 131* 107* 123* 106*  CREATININE 5.66* 5.70* 5.74* 4.88* 5.12* 5.40*  CALCIUM 7.6* 7.8* 7.6* 7.5* 7.7*  --   PHOS 6.1* 6.2* 5.8* 5.4* 5.9*  --    GFR: Estimated Creatinine Clearance: 11.7 mL/min (A) (by C-G formula based on SCr of 5.4 mg/dL (H)). Liver Function Tests: Recent Labs  Lab 11/08/17 0310 11/09/17 6546 11/10/17 0412 11/11/17 0719 11/12/17 0258  ALBUMIN 2.4* 2.4* 2.4* 4.8 3.9   No results for input(s): LIPASE, AMYLASE in the last 168 hours. No results for input(s): AMMONIA in the last 168 hours. Coagulation Profile: Recent Labs  Lab 11/07/17 1547  INR 1.26   Cardiac Enzymes: No results for input(s): CKTOTAL, CKMB, CKMBINDEX, TROPONINI in the last 168 hours. BNP (last 3 results) Recent Labs    09/25/17 1614  PROBNP 380.0*   HbA1C: No results for input(s): HGBA1C in the last 72 hours. CBG: Recent Labs  Lab 11/11/17 1126 11/11/17 1712 11/11/17 2148 11/12/17 0726 11/12/17 1301  GLUCAP 285* 349* 367* 521* 206*   Lipid Profile: No results for input(s): CHOL, HDL, LDLCALC, TRIG, CHOLHDL, LDLDIRECT in the last 72 hours. Thyroid Function Tests: No results for input(s): TSH, T4TOTAL, FREET4, T3FREE, THYROIDAB in the last 72 hours. Anemia Panel: No results for input(s): VITAMINB12, FOLATE, FERRITIN, TIBC, IRON, RETICCTPCT in the last 72 hours. Sepsis Labs: No results for input(s): PROCALCITON, LATICACIDVEN in the last 168 hours.  No results found for this or any previous visit (from the past 240 hour(s)).       Radiology Studies: Ir Fluoro Guide Cv Line Right  Result Date: 11/11/2017 CLINICAL DATA:  Renal failure requiring hemodialysis. EXAM: TUNNELED CENTRAL VENOUS HEMODIALYSIS CATHETER PLACEMENT WITH ULTRASOUND AND FLUOROSCOPIC GUIDANCE ANESTHESIA/SEDATION: 2.0 mg IV Versed; 100 mcg IV Fentanyl. Total Moderate Sedation Time:   20 minutes. The patient's level of  consciousness and physiologic status were continuously monitored during the procedure by Radiology nursing. MEDICATIONS: 2 g IV Ancef. FLUOROSCOPY TIME:  24 seconds.  1.5 mGy. PROCEDURE: The procedure, risks, benefits, and alternatives were explained to the patient. Questions regarding the procedure were encouraged and answered. The patient understands and consents to the procedure. A timeout was performed prior to initiating the procedure. The right neck and chest were prepped with chlorhexidine in a sterile fashion, and a sterile drape was applied covering the operative field. Maximum barrier sterile technique with sterile gowns and gloves were used for the procedure. Local anesthesia was provided with 1% lidocaine. Ultrasound was used to confirm patency of the right internal jugular vein. After creating a small venotomy incision, a 21 gauge needle was advanced into the right internal jugular vein under direct, real-time ultrasound guidance. Ultrasound image documentation was performed. After securing  guidewire access, an 8 Fr dilator was placed. A J-wire was kinked to measure appropriate catheter length. A Palindrome tunneled hemodialysis catheter measuring 19 cm from tip to cuff was chosen for placement. This was tunneled in a retrograde fashion from the chest wall to the venotomy incision. At the venotomy, serial dilatation was performed and a 16 Fr peel-away sheath was placed over a guidewire. The catheter was then placed through the sheath and the sheath removed. Final catheter positioning was confirmed and documented with a fluoroscopic spot image. The catheter was aspirated, flushed with saline, and injected with appropriate volume heparin dwells. The venotomy incision was closed with subcuticular 4-0 Vicryl. Dermabond was applied to the incision. The catheter exit site was secured with 0-Prolene retention sutures. COMPLICATIONS: None.  No pneumothorax. FINDINGS: After catheter placement, the tip lies in  the right atrium. The catheter aspirates normally and is ready for immediate use. IMPRESSION: Placement of tunneled hemodialysis catheter via the right internal jugular vein. The catheter tip lies in the right atrium. The catheter is ready for immediate use. Electronically Signed   By: Aletta Edouard M.D.   On: 11/11/2017 09:58   Ir US Guide Vasc Access Right  Result Date: 11/11/2017 CLINICAL DATA:  Renal failure requiring hemodialysis. EXAM: TUNNELED CENTRAL VENOUS HEMODIALYSIS CATHETER PLACEMENT WITH ULTRASOUND AND FLUOROSCOPIC GUIDANCE ANESTHESIA/SEDATION: 2.0 mg IV Versed; 100 mcg IV Fentanyl. Total Moderate Sedation Time:   20 minutes. The patient's level of consciousness and physiologic status were continuously monitored during the procedure by Radiology nursing. MEDICATIONS: 2 g IV Ancef. FLUOROSCOPY TIME:  24 seconds.  1.5 mGy. PROCEDURE: The procedure, risks, benefits, and alternatives were explained to the patient. Questions regarding the procedure were encouraged and answered. The patient understands and consents to the procedure. A timeout was performed prior to initiating the procedure. The right neck and chest were prepped with chlorhexidine in a sterile fashion, and a sterile drape was applied covering the operative field. Maximum barrier sterile technique with sterile gowns and gloves were used for the procedure. Local anesthesia was provided with 1% lidocaine. Ultrasound was used to confirm patency of the right internal jugular vein. After creating a small venotomy incision, a 21 gauge needle was advanced into the right internal jugular vein under direct, real-time ultrasound guidance. Ultrasound image documentation was performed. After securing guidewire access, an 8 Fr dilator was placed. A J-wire was kinked to measure appropriate catheter length. A Palindrome tunneled hemodialysis catheter measuring 19 cm from tip to cuff was chosen for placement. This was tunneled in a retrograde fashion  from the chest wall to the venotomy incision. At the venotomy, serial dilatation was performed and a 16 Fr peel-away sheath was placed over a guidewire. The catheter was then placed through the sheath and the sheath removed. Final catheter positioning was confirmed and documented with a fluoroscopic spot image. The catheter was aspirated, flushed with saline, and injected with appropriate volume heparin dwells. The venotomy incision was closed with subcuticular 4-0 Vicryl. Dermabond was applied to the incision. The catheter exit site was secured with 0-Prolene retention sutures. COMPLICATIONS: None.  No pneumothorax. FINDINGS: After catheter placement, the tip lies in the right atrium. The catheter aspirates normally and is ready for immediate use. IMPRESSION: Placement of tunneled hemodialysis catheter via the right internal jugular vein. The catheter tip lies in the right atrium. The catheter is ready for immediate use. Electronically Signed   By: Aletta Edouard M.D.   On: 11/11/2017 09:58   Dg Chest [facility]  1 View  Result Date: 11/11/2017 CLINICAL DATA:  Hemoptysis EXAM: PORTABLE CHEST 1 VIEW COMPARISON:  November 06, 2017 chest radiograph and chest CT November 07, 2017 FINDINGS: Central catheter tip in superior vena cava. No pneumothorax. There is widespread interstitial and patchy alveolar opacities, similar to recent studies. There is cardiomegaly with mild pulmonary venous hypertension. There is aortic atherosclerosis. No adenopathy. No bone lesions. IMPRESSION: Stable widespread interstitial and alveolar opacities as noted on recent studies. Question atypical pneumonia versus hypersensitivity pneumonitis. A degree of pulmonary hemorrhage is possible. A somewhat atypical presentation of congestive heart failure is also possible. CT suggests a degree of underlying fibrosis. Stable changes of pulmonary vascular congestion. There is aortic atherosclerosis. Central catheter as described without pneumothorax. Aortic  Atherosclerosis (ICD10-I70.0). Electronically Signed   By: Lowella Grip III M.D.   On: 11/11/2017 07:40        Scheduled Meds: . amLODipine  10 mg Oral Daily  . carvedilol  12.5 mg Oral BID WC  . ciprofloxacin-dexamethasone  4 drop Right EAR BID  . Darbepoetin Alfa  25 mcg Subcutaneous Q7 days  . ferrous gluconate  324 mg Oral Daily  . furosemide  40 mg Intravenous Daily  . insulin aspart  0-9 Units Subcutaneous TID WC  . insulin aspart protamine- aspart  10 Units Subcutaneous Q supper  . insulin aspart protamine- aspart  20 Units Subcutaneous Q breakfast  . multivitamin with minerals  1 tablet Oral Daily  . pantoprazole  40 mg Oral Daily  . [START ON 11/13/2017] predniSONE  60 mg Oral Q breakfast  . sodium bicarbonate  1,300 mg Oral BID  . sodium chloride flush  3 mL Intravenous Q12H   Continuous Infusions: . sodium chloride 250 mL (11/10/17 1555)  . sodium chloride    . sodium chloride    . therapeutic plasma exchange solution    . citrate dextrose    . citrate dextrose    . citrate dextrose    . methylPREDNISolone (SOLU-MEDROL) injection Stopped (11/11/17 1108)     LOS: 5 days    Time spent: 35 minutes.     Elmarie Shiley, MD [facility] Pager [phone]  If 7PM-7AM, please contact night-coverage [email or web address] Password [email or web address] 11/12/2017, 2:07 PM

## 2017-11-12 NOTE — Progress Notes (Addendum)
Patient ID: William [name], male   DOB: [birth year], 67 y.o.   MRN: [id number] [facility] Progress Note   Assessment/ Plan:   1. 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  Thrombocytopenia precludes kidney biopsy at this time--will follow counts to determine timing. Holding off on starting cyclophosphamide at this time with neutropenia. Will order for hemodialysis today in addition to PLEX for regulation of K and clearance.   2. Pancytopenia- seen by Hematology/Oncology with negative BM biopsy (4% plasma cells). Will order for 3 units PRBCs with HD today 3. Cirrhosis- unclear etiology, coincidentally seen on CT scan which further might be the reason for his thrombocytopenia with concomitant hypersplenism noted. Hepatitis testing was negative. 4. Anemia of CKD stage 4-without overt loss, continue ESA while in hospital. 5. Metabolic acidosis : Started on oral sodium bicarbonate, continue to monitor levels  6. HTN- stable 7. DM per primary  Subjective:   Without acute events overnight--he denies any complaints.   Objective:   BP (!) 145/70   Pulse 86   Temp 98.6 F (37 C) (Oral)   Resp 15   Ht 5' 5"  (1.651 m)   Wt 64.8 kg (142 lb 14.4 oz)   SpO2 96%   BMI 23.78 kg/m   Intake/Output Summary (Last 24 hours) at 11/12/2017 0848 Last data filed at 11/11/2017 1700 Gross per 24 hour  Intake 200 ml  Output -  Net 200 ml   Weight change: 1.319 kg (2 lb 14.5 oz)  Physical Exam: Gen: Comfortably resting on plasmapheresis CVS: Pulse regular rhythm, normal rate, S1 and S2 with ejection systolic murmur Resp: Coarse rales right lung, left lung clear, no audible rhonchi  Abd: Soft, flat, nontender Ext: No lower extremity edema  Imaging: Ir Fluoro Guide Cv Line  Right  Result Date: 11/11/2017 CLINICAL DATA:  Renal failure requiring hemodialysis. EXAM: TUNNELED CENTRAL VENOUS HEMODIALYSIS CATHETER PLACEMENT WITH ULTRASOUND AND FLUOROSCOPIC GUIDANCE ANESTHESIA/SEDATION: 2.0 mg IV Versed; 100 mcg IV Fentanyl. Total Moderate Sedation Time:   20 minutes. The patient's level of consciousness and physiologic status were continuously monitored during the procedure by Radiology nursing. MEDICATIONS: 2 g IV Ancef. FLUOROSCOPY TIME:  24 seconds.  1.5 mGy. PROCEDURE: The procedure, risks, benefits, and alternatives were explained to the patient. Questions regarding the procedure were encouraged and answered. The patient understands and consents to the procedure. A timeout was performed prior to initiating the procedure. The right neck and chest were prepped with chlorhexidine in a sterile fashion, and a sterile drape was applied covering the operative field. Maximum barrier sterile technique with sterile gowns and gloves were used for the procedure. Local anesthesia was provided with 1% lidocaine. Ultrasound was used to confirm patency of the right internal jugular vein. After creating a small venotomy incision, a 21 gauge needle was advanced into the right internal jugular vein under direct, real-time ultrasound guidance. Ultrasound image documentation was performed. After securing guidewire access, an 8 Fr dilator was placed. A J-wire was kinked to measure appropriate catheter length. A Palindrome tunneled hemodialysis catheter measuring 19 cm from tip to cuff was chosen for placement. This was tunneled in a retrograde fashion from the chest wall to the venotomy incision. At the venotomy, serial dilatation was performed and a 16 Fr peel-away sheath was placed over a guidewire. The catheter was then placed  through the sheath and the sheath removed. Final catheter positioning was confirmed and documented with a fluoroscopic spot image. The catheter was aspirated, flushed with saline,  and injected with appropriate volume heparin dwells. The venotomy incision was closed with subcuticular 4-0 Vicryl. Dermabond was applied to the incision. The catheter exit site was secured with 0-Prolene retention sutures. COMPLICATIONS: None.  No pneumothorax. FINDINGS: After catheter placement, the tip lies in the right atrium. The catheter aspirates normally and is ready for immediate use. IMPRESSION: Placement of tunneled hemodialysis catheter via the right internal jugular vein. The catheter tip lies in the right atrium. The catheter is ready for immediate use. Electronically Signed   By: Aletta Edouard M.D.   On: 11/11/2017 09:58   Ir US Guide Vasc Access Right  Result Date: 11/11/2017 CLINICAL DATA:  Renal failure requiring hemodialysis. EXAM: TUNNELED CENTRAL VENOUS HEMODIALYSIS CATHETER PLACEMENT WITH ULTRASOUND AND FLUOROSCOPIC GUIDANCE ANESTHESIA/SEDATION: 2.0 mg IV Versed; 100 mcg IV Fentanyl. Total Moderate Sedation Time:   20 minutes. The patient's level of consciousness and physiologic status were continuously monitored during the procedure by Radiology nursing. MEDICATIONS: 2 g IV Ancef. FLUOROSCOPY TIME:  24 seconds.  1.5 mGy. PROCEDURE: The procedure, risks, benefits, and alternatives were explained to the patient. Questions regarding the procedure were encouraged and answered. The patient understands and consents to the procedure. A timeout was performed prior to initiating the procedure. The right neck and chest were prepped with chlorhexidine in a sterile fashion, and a sterile drape was applied covering the operative field. Maximum barrier sterile technique with sterile gowns and gloves were used for the procedure. Local anesthesia was provided with 1% lidocaine. Ultrasound was used to confirm patency of the right internal jugular vein. After creating a small venotomy incision, a 21 gauge needle was advanced into the right internal jugular vein under direct, real-time ultrasound guidance.  Ultrasound image documentation was performed. After securing guidewire access, an 8 Fr dilator was placed. A J-wire was kinked to measure appropriate catheter length. A Palindrome tunneled hemodialysis catheter measuring 19 cm from tip to cuff was chosen for placement. This was tunneled in a retrograde fashion from the chest wall to the venotomy incision. At the venotomy, serial dilatation was performed and a 16 Fr peel-away sheath was placed over a guidewire. The catheter was then placed through the sheath and the sheath removed. Final catheter positioning was confirmed and documented with a fluoroscopic spot image. The catheter was aspirated, flushed with saline, and injected with appropriate volume heparin dwells. The venotomy incision was closed with subcuticular 4-0 Vicryl. Dermabond was applied to the incision. The catheter exit site was secured with 0-Prolene retention sutures. COMPLICATIONS: None.  No pneumothorax. FINDINGS: After catheter placement, the tip lies in the right atrium. The catheter aspirates normally and is ready for immediate use. IMPRESSION: Placement of tunneled hemodialysis catheter via the right internal jugular vein. The catheter tip lies in the right atrium. The catheter is ready for immediate use. Electronically Signed   By: Aletta Edouard M.D.   On: 11/11/2017 09:58   Dg Chest Port 1 View  Result Date: 11/11/2017 CLINICAL DATA:  Hemoptysis EXAM: PORTABLE CHEST 1 VIEW COMPARISON:  November 06, 2017 chest radiograph and chest CT November 07, 2017 FINDINGS: Central catheter tip in superior vena cava. No pneumothorax. There is widespread interstitial and patchy alveolar opacities, similar to recent studies. There is cardiomegaly with mild pulmonary venous hypertension. There is aortic atherosclerosis. No adenopathy. No bone lesions. IMPRESSION: Stable widespread interstitial  and alveolar opacities as noted on recent studies. Question atypical pneumonia versus hypersensitivity pneumonitis. A  degree of pulmonary hemorrhage is possible. A somewhat atypical presentation of congestive heart failure is also possible. CT suggests a degree of underlying fibrosis. Stable changes of pulmonary vascular congestion. There is aortic atherosclerosis. Central catheter as described without pneumothorax. Aortic Atherosclerosis (ICD10-I70.0). Electronically Signed   By: Lowella Grip III M.D.   On: 11/11/2017 07:40    Labs: BMET Recent Labs  Lab 11/06/17 1700 11/07/17 0449 11/08/17 0310 11/09/17 6283 11/10/17 0412 11/11/17 0719 11/12/17 0258  NA 135 133* 135 139 139 144 139  K 4.6 5.0 4.9 5.3* 4.9 4.9 5.6*  CL 106 103 105 107 108 115* 109  CO2 17* 17* 16* 17* 17* 14* 17*  GLUCOSE 117* 212* 258* 176* 178* 180* 458*  BUN 105* 107* 117* 124* 131* 107* 123*  CREATININE 5.47* 5.57* 5.66* 5.70* 5.74* 4.88* 5.12*  CALCIUM 8.1* 7.8* 7.6* 7.8* 7.6* 7.5* 7.7*  PHOS  --   --  6.1* 6.2* 5.8* 5.4* 5.9*   CBC Recent Labs  Lab 11/06/17 1700  11/09/17 0652 11/10/17 0412 11/11/17 0719 11/12/17 0258  WBC 3.5*   < > 2.5* 2.4* 1.6* 2.6*  NEUTROABS 2.4  --   --   --   --  2.2  HGB 8.0*   < > 7.5* 7.4* 7.0* 6.7*  HCT 25.3*   < > 23.2* 23.0* 21.8* 21.2*  MCV 74.0*   < > 73.9* 73.7* 73.6* 72.9*  PLT 66*   < > 53* 60* 48* 51*   < > = values in this interval not displayed.    Medications:    . amLODipine  10 mg Oral Daily  . calcium carbonate  2 tablet Oral Q3H  . carvedilol  12.5 mg Oral BID WC  . ciprofloxacin-dexamethasone  4 drop Right EAR BID  . Darbepoetin Alfa  25 mcg Subcutaneous Q7 days  . ferrous gluconate  324 mg Oral Daily  . furosemide  40 mg Intravenous Daily  . insulin aspart  0-9 Units Subcutaneous TID WC  . insulin aspart protamine- aspart  10 Units Subcutaneous Q supper  . insulin aspart protamine- aspart  20 Units Subcutaneous Q breakfast  . multivitamin with minerals  1 tablet Oral Daily  . pantoprazole  40 mg Oral Daily  . [START ON 11/13/2017] predniSONE  60 mg Oral Q  breakfast  . sodium bicarbonate  1,300 mg Oral BID  . sodium chloride flush  3 mL Intravenous Q12H  . sodium polystyrene  15 g Oral Once   Elmarie Shiley, MD 11/12/2017, 8:48 AM

## 2017-11-13 ENCOUNTER — Inpatient Hospital Stay (HOSPITAL_COMMUNITY): Payer: Medicare Other

## 2017-11-13 DIAGNOSIS — I129 Hypertensive chronic kidney disease with stage 1 through stage 4 chronic kidney disease, or unspecified chronic kidney disease: Secondary | ICD-10-CM | POA: Diagnosis not present

## 2017-11-13 DIAGNOSIS — I5033 Acute on chronic diastolic (congestive) heart failure: Secondary | ICD-10-CM | POA: Diagnosis not present

## 2017-11-13 DIAGNOSIS — Z23 Encounter for immunization: Secondary | ICD-10-CM | POA: Diagnosis not present

## 2017-11-13 DIAGNOSIS — I130 Hypertensive heart and chronic kidney disease with heart failure and stage 1 through stage 4 chronic kidney disease, or unspecified chronic kidney disease: Secondary | ICD-10-CM | POA: Diagnosis not present

## 2017-11-13 DIAGNOSIS — N189 Chronic kidney disease, unspecified: Secondary | ICD-10-CM

## 2017-11-13 DIAGNOSIS — N184 Chronic kidney disease, stage 4 (severe): Secondary | ICD-10-CM | POA: Diagnosis not present

## 2017-11-13 DIAGNOSIS — N179 Acute kidney failure, unspecified: Secondary | ICD-10-CM | POA: Diagnosis not present

## 2017-11-13 DIAGNOSIS — E1122 Type 2 diabetes mellitus with diabetic chronic kidney disease: Secondary | ICD-10-CM | POA: Diagnosis not present

## 2017-11-13 DIAGNOSIS — D61818 Other pancytopenia: Secondary | ICD-10-CM | POA: Diagnosis not present

## 2017-11-13 DIAGNOSIS — J9621 Acute and chronic respiratory failure with hypoxia: Secondary | ICD-10-CM | POA: Diagnosis not present

## 2017-11-13 DIAGNOSIS — R042 Hemoptysis: Secondary | ICD-10-CM | POA: Diagnosis not present

## 2017-11-13 DIAGNOSIS — R05 Cough: Secondary | ICD-10-CM | POA: Diagnosis not present

## 2017-11-13 LAB — RENAL FUNCTION PANEL
Albumin: 4.9 g/dL (ref 3.5–5.0)
Anion gap: 12 (ref 5–15)
BUN: 61 mg/dL — AB (ref 6–20)
CALCIUM: 7.8 mg/dL — AB (ref 8.9–10.3)
CO2: 23 mmol/L (ref 22–32)
CREATININE: 3.28 mg/dL — AB (ref 0.61–1.24)
Chloride: 105 mmol/L (ref 101–111)
GFR calc Af Amer: 21 mL/min — ABNORMAL LOW (ref >60)
GFR calc non Af Amer: 18 mL/min — ABNORMAL LOW (ref >60)
GLUCOSE: 418 mg/dL — AB (ref 65–99)
Phosphorus: 4.8 mg/dL — ABNORMAL HIGH (ref 2.5–4.6)
Potassium: 5 mmol/L (ref 3.5–5.1)
SODIUM: 140 mmol/L (ref 135–145)

## 2017-11-13 LAB — TYPE AND SCREEN
ABO/RH(D): O POS
ANTIBODY SCREEN: NEGATIVE
UNIT DIVISION: 0
Unit division: 0
Unit division: 0

## 2017-11-13 LAB — CBC WITH DIFFERENTIAL/PLATELET
BASOS PCT: 0 %
Basophils Absolute: 0 10*3/uL (ref 0.0–0.1)
EOS ABS: 0 10*3/uL (ref 0.0–0.7)
Eosinophils Relative: 0 %
HEMATOCRIT: 28.6 % — AB (ref 39.0–52.0)
Hemoglobin: 9.6 g/dL — ABNORMAL LOW (ref 13.0–17.0)
Lymphocytes Relative: 10 %
Lymphs Abs: 0.3 10*3/uL — ABNORMAL LOW (ref 0.7–4.0)
MCH: 25.3 pg — ABNORMAL LOW (ref 26.0–34.0)
MCHC: 33.6 g/dL (ref 30.0–36.0)
MCV: 75.5 fL — ABNORMAL LOW (ref 78.0–100.0)
MONO ABS: 0.2 10*3/uL (ref 0.1–1.0)
MONOS PCT: 7 %
NEUTROS ABS: 2.4 10*3/uL (ref 1.7–7.7)
Neutrophils Relative %: 83 %
Platelets: 59 10*3/uL — ABNORMAL LOW (ref 150–400)
RBC: 3.79 MIL/uL — ABNORMAL LOW (ref 4.22–5.81)
RDW: 15.6 % — AB (ref 11.5–15.5)
WBC: 2.8 10*3/uL — ABNORMAL LOW (ref 4.0–10.5)

## 2017-11-13 LAB — BPAM RBC
BLOOD PRODUCT EXPIRATION DATE: 201904162359
BLOOD PRODUCT EXPIRATION DATE: 201905032359
Blood Product Expiration Date: 201905032359
ISSUE DATE / TIME: 201904101139
ISSUE DATE / TIME: 201904101139
ISSUE DATE / TIME: 201904101139
UNIT TYPE AND RH: 5100
UNIT TYPE AND RH: 5100
Unit Type and Rh: 5100

## 2017-11-13 LAB — HEPATITIS B SURFACE ANTIBODY,QUALITATIVE: Hep B S Ab: NONREACTIVE

## 2017-11-13 LAB — PROTIME-INR
INR: 1.77
Prothrombin Time: 20.4 seconds — ABNORMAL HIGH (ref 11.4–15.2)

## 2017-11-13 LAB — GLUCOSE, CAPILLARY
GLUCOSE-CAPILLARY: 167 mg/dL — AB (ref 65–99)
Glucose-Capillary: 446 mg/dL — ABNORMAL HIGH (ref 65–99)
Glucose-Capillary: 407 mg/dL — ABNORMAL HIGH (ref 65–99)
Glucose-Capillary: 301 mg/dL — ABNORMAL HIGH (ref 65–99)

## 2017-11-13 LAB — LACTATE DEHYDROGENASE: LDH: 120 U/L (ref 98–192)

## 2017-11-13 LAB — GLUCOSE, RANDOM: Glucose, Bld: 490 mg/dL — ABNORMAL HIGH (ref 65–99)

## 2017-11-13 LAB — HEPATIC FUNCTION PANEL
ALBUMIN: 4.9 g/dL (ref 3.5–5.0)
ALT: 6 U/L — ABNORMAL LOW (ref 17–63)
AST: 21 U/L (ref 15–41)
Alkaline Phosphatase: 32 U/L — ABNORMAL LOW (ref 38–126)
BILIRUBIN TOTAL: 1.6 mg/dL — AB (ref 0.3–1.2)
Bilirubin, Direct: 0.4 mg/dL (ref 0.1–0.5)
Indirect Bilirubin: 1.2 mg/dL — ABNORMAL HIGH (ref 0.3–0.9)
Total Protein: 6 g/dL — ABNORMAL LOW (ref 6.5–8.1)

## 2017-11-13 LAB — PATHOLOGIST SMEAR REVIEW

## 2017-11-13 LAB — HEPATITIS B CORE ANTIBODY, TOTAL: HEP B C TOTAL AB: POSITIVE — AB

## 2017-11-13 LAB — HEPATITIS B SURFACE ANTIGEN: Hepatitis B Surface Ag: NEGATIVE

## 2017-11-13 MED ORDER — INSULIN ASPART PROT & ASPART (70-30 MIX) 100 UNIT/ML ~~LOC~~ SUSP
25.0000 [IU] | Freq: Every day | SUBCUTANEOUS | Status: DC
  Administered 2017-11-13: 20 [IU] via SUBCUTANEOUS
  Filled 2017-11-13: qty 10

## 2017-11-13 MED ORDER — INSULIN ASPART PROT & ASPART (70-30 MIX) 100 UNIT/ML ~~LOC~~ SUSP
20.0000 [IU] | Freq: Every day | SUBCUTANEOUS | Status: DC
  Administered 2017-11-13 – 2017-11-16 (×3): 20 [IU] via SUBCUTANEOUS

## 2017-11-13 MED ORDER — ALBUTEROL SULFATE (2.5 MG/3ML) 0.083% IN NEBU
5.0000 mg | INHALATION_SOLUTION | Freq: Three times a day (TID) | RESPIRATORY_TRACT | Status: DC
  Administered 2017-11-13 – 2017-11-14 (×5): 5 mg via RESPIRATORY_TRACT
  Filled 2017-11-13 (×5): qty 6

## 2017-11-13 MED ORDER — SODIUM CHLORIDE 0.9 % IV SOLN
Freq: Once | INTRAVENOUS | Status: AC
  Administered 2017-11-13: 18:00:00 via INTRAVENOUS

## 2017-11-13 NOTE — Progress Notes (Signed)
Per PA Sherlie Ban, we will reevaluate pt tomorrow (4/12) for INR and biopsy.

## 2017-11-13 NOTE — Progress Notes (Signed)
Patient ID: William [name], male   DOB: 06-17-1951, 67 y.o.   MRN: [id number] [facility] Progress Note   Assessment/ Plan:   1. 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  Yesterday, required hemodialysis for clearance/regulation of hyperkalemia as well as facilitate PRBC transfusion.  Today, plans in place for possible biopsy (platelet count permitting) by interventional radiology.  Plan for plasmapheresis again tomorrow plus/minus hemodialysis based on his labs.  We will have a better determination as to whether he will need long-term hemodialysis over the weekend (this would prompt initiation of the CLIP process and pursuing long term HD access) 2. Pancytopenia- seen by Hematology/Oncology with negative BM biopsy (4% plasma cells). Awaiting CBC from this morning, I have been apprehensive to start him on cyclophosphamide with his neutropenia. 3. Cirrhosis- unclear etiology, coincidentally seen on CT scan which further might be the reason for his thrombocytopenia with concomitant hypersplenism noted. Hepatitis testing was negative. 4. Anemia of CKD stage 4-without overt loss, continue ESA while in hospital. 5. Metabolic acidosis : Started on oral sodium bicarbonate, continue to monitor levels  6. HTN- stable 7. DM per primary  Subjective:   Without acute events overnight--he was fatigued after almost 7 hours in the dialysis unit getting hemodialysis after plasmapheresis.   Objective:   BP (!) 154/74   Pulse 85   Temp 97.7 F (36.5 C) (Oral)   Resp 18   Ht 5' 5"  (1.651 m)   Wt 63.2 kg (139 lb 5.3 oz)   SpO2 95%   BMI 23.19 kg/m   Intake/Output Summary (Last 24 hours) at 11/13/2017 0856 Last data filed at 11/13/2017 0500 Gross per 24 hour  Intake 1163 ml  Output 921  ml  Net 242 ml   Weight change: -0.219 kg (-7.7 oz)  Physical Exam: Gen: Comfortably resting in bed, son at bedside. CVS: Pulse regular rhythm, normal rate, S1 and S2 with ejection systolic murmur Resp: Fine rales both lungs, no rhonchi/wheeze Abd: Soft, flat, nontender Ext: No lower extremity edema  Imaging: No results found.  Labs: BMET Recent Labs  Lab 11/07/17 0449 11/08/17 0310 11/09/17 4098 11/10/17 0412 11/11/17 0719 11/12/17 0258 11/12/17 0825 11/13/17 0405  NA 133* 135 139 139 144 139 142 140  K 5.0 4.9 5.3* 4.9 4.9 5.6* 4.7 5.0  CL 103 105 107 108 115* 109 107 105  CO2 17* 16* 17* 17* 14* 17*  --  23  GLUCOSE 212* 258* 176* 178* 180* 458* 536* 418*  BUN 107* 117* 124* 131* 107* 123* 106* 61*  CREATININE 5.57* 5.66* 5.70* 5.74* 4.88* 5.12* 5.40* 3.28*  CALCIUM 7.8* 7.6* 7.8* 7.6* 7.5* 7.7*  --  7.8*  PHOS  --  6.1* 6.2* 5.8* 5.4* 5.9*  --  4.8*   CBC Recent Labs  Lab 11/06/17 1700  11/09/17 0652 11/10/17 0412 11/11/17 0719 11/12/17 0258 11/12/17 0825  WBC 3.5*   < > 2.5* 2.4* 1.6* 2.6*  --   NEUTROABS 2.4  --   --   --   --  2.2  --   HGB 8.0*   < > 7.5* 7.4* 7.0* 6.7* 7.8*  HCT 25.3*   < > 23.2* 23.0* 21.8* 21.2* 23.0*  MCV 74.0*   < > 73.9* 73.7* 73.6* 72.9*  --   PLT 66*   < > 53*  60* 48* 51*  --    < > = values in this interval not displayed.    Medications:    . albuterol  5 mg Nebulization BID  . amLODipine  10 mg Oral Daily  . carvedilol  12.5 mg Oral BID WC  . ciprofloxacin-dexamethasone  4 drop Right EAR BID  . Darbepoetin Alfa  25 mcg Subcutaneous Q7 days  . ferrous gluconate  324 mg Oral Daily  . furosemide  40 mg Intravenous Daily  . insulin aspart  0-9 Units Subcutaneous TID WC  . insulin aspart protamine- aspart  20 Units Subcutaneous Q supper  . insulin aspart protamine- aspart  25 Units Subcutaneous Q breakfast  . multivitamin with minerals  1 tablet Oral Daily  . pantoprazole  40 mg Oral Daily  . predniSONE  60 mg Oral Q  breakfast  . sodium bicarbonate  1,300 mg Oral BID  . sodium chloride flush  3 mL Intravenous Q12H   Elmarie Shiley, MD 11/13/2017, 8:56 AM

## 2017-11-13 NOTE — Progress Notes (Signed)
Bilateral Upper Extremity Vein Map  Right  Upper Extremity Vein Map  Cephalic  Segment Diameter Depth Comment  1. Axilla 1.4 mm 2.7 mm   2. Mid upper arm 2.6 mm 2.4 mm   3. Above AC 1.5 mm 2.1 mm   4. In AC 1.4 mm 1.8 mm   5. Below AC 3.0 mm 2.4 mm Branch  6. Mid forearm 2.7 mm 1.7 mm   7. Wrist 2.6 mm 2.2 mm    Basilic  Segment Diameter Depth Comment  1. Axilla 4.0 mm 6.7 mm   2. Mid upper arm 4.5 mm 7.4 mm   3. Above AC 3.1 mm 4.8 mm Branch  4. In AC 3.3 mm 2.2 mm Branch  5. Below AC 1.7 mm 1.6 mm   6. Mid forearm 1.6 mm 1.7 mm   7. Wrist 1.1 mm 1.2 mm     Left Upper Extremity Vein Map  Cephalic  Segment Diameter Depth Comment  1. Axilla 2.4 mm 6.1 mm   2. Mid upper arm 1.1 mm 2.0 mm   3. Above AC 1.4 mm 2.2 mm   4. In AC 1.4 mm 1.4 mm   5. Below AC 1.7 mm 1.7 mm   6. Mid forearm 1.7 mm 1.8 mm   7. Wrist 2.2 mm 2.0 mm    Basilic  Segment Diameter Depth Comment  1. Axilla 7.5 mm 4.3 mm   2. Mid upper arm 4.8 mm 8.9 mm   3. Above AC 3.8 mm 3.8 mm Branch  4. In AC 2.9 mm 1.7 mm   5. Below AC 2.6 mm 1.6 mm Branch  6. Mid forearm 2.1 mm 1.3 mm Branch  7. Wrist 2.0 mm 1.3 mm    11/13/17 12:04 PM Carlos Levering RVT

## 2017-11-13 NOTE — Progress Notes (Signed)
PROGRESS NOTE    William [name]  GEZ:[id number] DOB: [birth year] DOA: 11/06/2017 PCP: Gildardo Pounds, NP   Brief Narrative: William [name] is a 67 y.o. man PMHchronic kidney disease stage IV, cirrhosis, pancytopenia, diabetes mellitus, diastolic heart failure, presented with chronic cough, mild shortness of breath, edema. Has had extensive workup by pulmonology in the outpatient setting, admitted for acute on chronic hypoxic respiratory failure, acute on chronic diastolic heart failure, acute kidney injury superimposed on CKD stage IV. Concern for Goodpasture's disease    Assessment & Plan:   Principal Problem:   Acute renal failure superimposed on stage 4 chronic kidney disease (HCC) Active Problems:   Type II diabetes mellitus with renal manifestations (HCC)   Acute on chronic respiratory failure with hypoxia (HCC)   Acute on chronic diastolic CHF (congestive heart failure) (HCC)   Pancytopenia (HCC)   AKI (acute kidney injury) ([place])   Acute hypoxemic respiratory failure (HCC)   Hemoptysis   1-Acute Renal failure; on chronic kidney diseases stage III;  Vs rapidly progressive glomerulonephritis. Good pasture syndrome; Getting IV solumedrol and plasmapheresis.  nephrologist contemplating renal biopsy during this admission.  Started HD 4-10. In process of evaluation of need for long term HD.  Getting plasmapheresis.  Plan for renal biopsy tomorrow if INR lower.   2-Hemoptysis; improved.   3-Pancytopenia; discussed with Dr Julien Nordmann, this is probably reactive. Treat support care, transfusion as needed , Neupogen as needed.  For anemia, he is on aranesp.  He received 3 units PRBC during HD 4-10 WBC improved today,. normal ANC.  LFT increase bilirubin. LDH normal, peripheral smear pancytopenia. Haptoglobin pending.   4-Cirrhosis; seen on CT scan. Might explain thrombocytopenia.  Hepatitis panel negative one moth ago.   5-Metabolic acidosis; on oral bicarb tablet.   6-DM; type 2;  70/30;;increase NPH night coverage to 20 units and morning to 25 units.  SSI.  Hopefully cbg will start to decrease now that IV solumedrol has been discontinue.   7-HTN; on norvasc.   DVT prophylaxis: scd Code Status: full code.  Family Communication: son at bedside.  Disposition Plan; home at time of discharge   Consultants:   Nephrology  CCM.   Oncology    Procedures:   Plasmapheresis.    Antimicrobials: none   Subjective: Still coughing and having wheezing.   Objective: Vitals:   11/13/17 0546 11/13/17 0737 11/13/17 0805 11/13/17 1323  BP:   (!) 154/74   Pulse:  85 85 86  Resp:  20 18 19   Temp:   97.7 F (36.5 C)   TempSrc:   Oral   SpO2:  92% 95% 94%  Weight: 63.2 kg (139 lb 5.3 oz)     Height:        Intake/Output Summary (Last 24 hours) at 11/13/2017 1529 Last data filed at 11/13/2017 1300 Gross per 24 hour  Intake 240 ml  Output 2 ml  Net 238 ml   Filed Weights   11/12/17 1035 11/12/17 1345 11/13/17 0546  Weight: 64.6 kg (142 lb 6.7 oz) 63.7 kg (140 lb 6.9 oz) 63.2 kg (139 lb 5.3 oz)    Examination:  General exam: NAD  Respiratory system: Bilateral wheezing  Cardiovascular system: S 1, S 2 RRR Gastrointestinal system: BS present, soft, nt Central nervous system: non focal.  Extremities: Symmetric power.  Skin: no rash      Data Reviewed: I have personally reviewed following labs and imaging studies  CBC: Recent Labs  Lab 11/06/17 1700  11/09/17 0652 11/10/17  7829 11/11/17 0719 11/12/17 0258 11/12/17 0825 11/13/17 0405  WBC 3.5*   < > 2.5* 2.4* 1.6* 2.6*  --  2.8*  NEUTROABS 2.4  --   --   --   --  2.2  --  2.4  HGB 8.0*   < > 7.5* 7.4* 7.0* 6.7* 7.8* 9.6*  HCT 25.3*   < > 23.2* 23.0* 21.8* 21.2* 23.0* 28.6*  MCV 74.0*   < > 73.9* 73.7* 73.6* 72.9*  --  75.5*  PLT 66*   < > 53* 60* 48* 51*  --  59*   < > = values in this interval not displayed.   Basic Metabolic Panel: Recent Labs  Lab 11/09/17 0652 11/10/17 0412  11/11/17 0719 11/12/17 0258 11/12/17 0825 11/13/17 0405 11/13/17 0808  NA 139 139 144 139 142 140  --   K 5.3* 4.9 4.9 5.6* 4.7 5.0  --   CL 107 108 115* 109 107 105  --   CO2 17* 17* 14* 17*  --  23  --   GLUCOSE 176* 178* 180* 458* 536* 418* 490*  BUN 124* 131* 107* 123* 106* 61*  --   CREATININE 5.70* 5.74* 4.88* 5.12* 5.40* 3.28*  --   CALCIUM 7.8* 7.6* 7.5* 7.7*  --  7.8*  --   PHOS 6.2* 5.8* 5.4* 5.9*  --  4.8*  --    GFR: Estimated Creatinine Clearance: 19.3 mL/min (A) (by C-G formula based on SCr of 3.28 mg/dL (H)). Liver Function Tests: Recent Labs  Lab 11/09/17 5621 11/10/17 0412 11/11/17 0719 11/12/17 0258 11/13/17 0405  AST  --   --   --   --  21  ALT  --   --   --   --  6*  ALKPHOS  --   --   --   --  32*  BILITOT  --   --   --   --  1.6*  PROT  --   --   --   --  6.0*  ALBUMIN 2.4* 2.4* 4.8 3.9 4.9  4.9   No results for input(s): LIPASE, AMYLASE in the last 168 hours. No results for input(s): AMMONIA in the last 168 hours. Coagulation Profile: Recent Labs  Lab 11/07/17 1547 11/13/17 0807  INR 1.26 1.77   Cardiac Enzymes: No results for input(s): CKTOTAL, CKMB, CKMBINDEX, TROPONINI in the last 168 hours. BNP (last 3 results) Recent Labs    09/25/17 1614  PROBNP 380.0*   HbA1C: No results for input(s): HGBA1C in the last 72 hours. CBG: Recent Labs  Lab 11/12/17 1431 11/12/17 1720 11/12/17 2152 11/13/17 0731 11/13/17 1210  GLUCAP 184* 270* 281* 446* 407*   Lipid Profile: No results for input(s): CHOL, HDL, LDLCALC, TRIG, CHOLHDL, LDLDIRECT in the last 72 hours. Thyroid Function Tests: No results for input(s): TSH, T4TOTAL, FREET4, T3FREE, THYROIDAB in the last 72 hours. Anemia Panel: No results for input(s): VITAMINB12, FOLATE, FERRITIN, TIBC, IRON, RETICCTPCT in the last 72 hours. Sepsis Labs: No results for input(s): PROCALCITON, LATICACIDVEN in the last 168 hours.  No results found for this or any previous visit (from the past 240  hour(s)).       Radiology Studies: No results found.      Scheduled Meds: . albuterol  5 mg Nebulization TID  . amLODipine  10 mg Oral Daily  . carvedilol  12.5 mg Oral BID WC  . ciprofloxacin-dexamethasone  4 drop Right EAR BID  . Darbepoetin Alfa  25  mcg Subcutaneous Q7 days  . ferrous gluconate  324 mg Oral Daily  . furosemide  40 mg Intravenous Daily  . insulin aspart  0-9 Units Subcutaneous TID WC  . insulin aspart protamine- aspart  20 Units Subcutaneous Q supper  . insulin aspart protamine- aspart  25 Units Subcutaneous Q breakfast  . multivitamin with minerals  1 tablet Oral Daily  . pantoprazole  40 mg Oral Daily  . predniSONE  60 mg Oral Q breakfast  . sodium bicarbonate  1,300 mg Oral BID  . sodium chloride flush  3 mL Intravenous Q12H   Continuous Infusions: . sodium chloride 250 mL (11/10/17 1555)  . sodium chloride    . sodium chloride    . sodium chloride       LOS: 6 days    Time spent: 35 minutes.     Elmarie Shiley, MD [facility] Pager [phone]  If 7PM-7AM, please contact night-coverage [email or web address] Password [facility] 11/13/2017, 3:29 PM

## 2017-11-13 NOTE — Progress Notes (Addendum)
Patient with INR of 1.77 this AM, platelets stable at 59. Patient's INR was 1.26 6 days ago.  Dr. Anselm Pancoast has discussed merits of proceeding with biopsy with Dr. Posey Pronto.  Will plan to recheck lab work tomorrow.  No procedure planned today. RN aware.  Orders updated.  Brynda Greathouse, MS RD PA-C 12:15 PM

## 2017-11-13 NOTE — Progress Notes (Signed)
Patient's INR is elevated today at 1.77. Awaiting MD to decide if we will proceed or if procedure will be delayed until tomorrow if IR schedule allows.

## 2017-11-14 ENCOUNTER — Encounter (HOSPITAL_COMMUNITY): Payer: Medicaid Other

## 2017-11-14 DIAGNOSIS — N179 Acute kidney failure, unspecified: Secondary | ICD-10-CM | POA: Diagnosis not present

## 2017-11-14 DIAGNOSIS — D61818 Other pancytopenia: Secondary | ICD-10-CM | POA: Diagnosis not present

## 2017-11-14 DIAGNOSIS — N184 Chronic kidney disease, stage 4 (severe): Secondary | ICD-10-CM | POA: Diagnosis not present

## 2017-11-14 DIAGNOSIS — I130 Hypertensive heart and chronic kidney disease with heart failure and stage 1 through stage 4 chronic kidney disease, or unspecified chronic kidney disease: Secondary | ICD-10-CM | POA: Diagnosis not present

## 2017-11-14 DIAGNOSIS — E1122 Type 2 diabetes mellitus with diabetic chronic kidney disease: Secondary | ICD-10-CM | POA: Diagnosis not present

## 2017-11-14 DIAGNOSIS — I5033 Acute on chronic diastolic (congestive) heart failure: Secondary | ICD-10-CM | POA: Diagnosis not present

## 2017-11-14 DIAGNOSIS — Z23 Encounter for immunization: Secondary | ICD-10-CM | POA: Diagnosis not present

## 2017-11-14 DIAGNOSIS — J9621 Acute and chronic respiratory failure with hypoxia: Secondary | ICD-10-CM | POA: Diagnosis not present

## 2017-11-14 DIAGNOSIS — R05 Cough: Secondary | ICD-10-CM | POA: Diagnosis not present

## 2017-11-14 DIAGNOSIS — R042 Hemoptysis: Secondary | ICD-10-CM | POA: Diagnosis not present

## 2017-11-14 DIAGNOSIS — I129 Hypertensive chronic kidney disease with stage 1 through stage 4 chronic kidney disease, or unspecified chronic kidney disease: Secondary | ICD-10-CM | POA: Diagnosis not present

## 2017-11-14 LAB — CBC WITH DIFFERENTIAL/PLATELET
BASOS ABS: 0 10*3/uL (ref 0.0–0.1)
BASOS PCT: 0 %
EOS ABS: 0 10*3/uL (ref 0.0–0.7)
Eosinophils Relative: 0 %
HCT: 27.5 % — ABNORMAL LOW (ref 39.0–52.0)
HEMOGLOBIN: 9.2 g/dL — AB (ref 13.0–17.0)
Lymphocytes Relative: 9 %
Lymphs Abs: 0.3 10*3/uL — ABNORMAL LOW (ref 0.7–4.0)
MCH: 25.5 pg — ABNORMAL LOW (ref 26.0–34.0)
MCHC: 33.5 g/dL (ref 30.0–36.0)
MCV: 76.2 fL — ABNORMAL LOW (ref 78.0–100.0)
Monocytes Absolute: 0.3 10*3/uL (ref 0.1–1.0)
Monocytes Relative: 9 %
NEUTROS ABS: 2.6 10*3/uL (ref 1.7–7.7)
NEUTROS PCT: 82 %
Platelets: 55 10*3/uL — ABNORMAL LOW (ref 150–400)
RBC: 3.61 MIL/uL — ABNORMAL LOW (ref 4.22–5.81)
RDW: 15.9 % — ABNORMAL HIGH (ref 11.5–15.5)
WBC: 3.2 10*3/uL — ABNORMAL LOW (ref 4.0–10.5)

## 2017-11-14 LAB — POCT I-STAT, CHEM 8
BUN: 71 mg/dL — ABNORMAL HIGH (ref 6–20)
CALCIUM ION: 1.03 mmol/L — AB (ref 1.15–1.40)
Chloride: 100 mmol/L — ABNORMAL LOW (ref 101–111)
Creatinine, Ser: 3.8 mg/dL — ABNORMAL HIGH (ref 0.61–1.24)
GLUCOSE: 206 mg/dL — AB (ref 65–99)
HCT: 29 % — ABNORMAL LOW (ref 39.0–52.0)
Hemoglobin: 9.9 g/dL — ABNORMAL LOW (ref 13.0–17.0)
Potassium: 4.1 mmol/L (ref 3.5–5.1)
Sodium: 143 mmol/L (ref 135–145)
TCO2: 26 mmol/L (ref 22–32)

## 2017-11-14 LAB — RENAL FUNCTION PANEL
ANION GAP: 13 (ref 5–15)
Albumin: 4.6 g/dL (ref 3.5–5.0)
BUN: 82 mg/dL — ABNORMAL HIGH (ref 6–20)
CO2: 24 mmol/L (ref 22–32)
Calcium: 8 mg/dL — ABNORMAL LOW (ref 8.9–10.3)
Chloride: 104 mmol/L (ref 101–111)
Creatinine, Ser: 3.77 mg/dL — ABNORMAL HIGH (ref 0.61–1.24)
GFR, EST AFRICAN AMERICAN: 18 mL/min — AB (ref >60)
GFR, EST NON AFRICAN AMERICAN: 15 mL/min — AB (ref >60)
Glucose, Bld: 135 mg/dL — ABNORMAL HIGH (ref 65–99)
PHOSPHORUS: 4.8 mg/dL — AB (ref 2.5–4.6)
POTASSIUM: 4.1 mmol/L (ref 3.5–5.1)
Sodium: 141 mmol/L (ref 135–145)

## 2017-11-14 LAB — PROTIME-INR
INR: 1.62
PROTHROMBIN TIME: 19.1 s — AB (ref 11.4–15.2)

## 2017-11-14 LAB — PREPARE FRESH FROZEN PLASMA: UNIT DIVISION: 0

## 2017-11-14 LAB — BPAM FFP
Blood Product Expiration Date: 201904122359
ISSUE DATE / TIME: 201904111751
Unit Type and Rh: 7300

## 2017-11-14 LAB — GLUCOSE, CAPILLARY
GLUCOSE-CAPILLARY: 191 mg/dL — AB (ref 65–99)
GLUCOSE-CAPILLARY: 204 mg/dL — AB (ref 65–99)
GLUCOSE-CAPILLARY: 184 mg/dL — AB (ref 65–99)
Glucose-Capillary: 173 mg/dL — ABNORMAL HIGH (ref 65–99)

## 2017-11-14 LAB — HAPTOGLOBIN: Haptoglobin: 75 mg/dL (ref 34–200)

## 2017-11-14 MED ORDER — ACD FORMULA A 0.73-2.45-2.2 GM/100ML VI SOLN
Status: AC
  Administered 2017-11-14: 500 mL via INTRAVENOUS
  Filled 2017-11-14: qty 500

## 2017-11-14 MED ORDER — SODIUM CHLORIDE 0.9 % IV SOLN
INTRAVENOUS | Status: AC
  Administered 2017-11-14 (×5): via INTRAVENOUS_CENTRAL
  Filled 2017-11-14 (×5): qty 200

## 2017-11-14 MED ORDER — ANTICOAGULANT SODIUM CITRATE 4% (200MG/5ML) IV SOLN
5.0000 mL | Freq: Once | Status: AC
  Administered 2017-11-15: 5 mL
  Filled 2017-11-14: qty 250
  Filled 2017-11-14: qty 5

## 2017-11-14 MED ORDER — DIPHENHYDRAMINE HCL 25 MG PO CAPS: 25.0000 mg | ORAL_CAPSULE | Freq: Four times a day (QID) | ORAL | Status: DC | PRN

## 2017-11-14 MED ORDER — CALCIUM CARBONATE ANTACID 500 MG PO CHEW
2.0000 | CHEWABLE_TABLET | ORAL | Status: AC
  Administered 2017-11-14: 400 mg via ORAL
  Filled 2017-11-14: qty 2

## 2017-11-14 MED ORDER — SODIUM CHLORIDE 0.9 % IV SOLN: Freq: Once | INTRAVENOUS | Status: DC

## 2017-11-14 MED ORDER — ACETAMINOPHEN 325 MG PO TABS: 650.0000 mg | ORAL_TABLET | ORAL | Status: DC | PRN

## 2017-11-14 MED ORDER — ACD FORMULA A 0.73-2.45-2.2 GM/100ML VI SOLN
500.0000 mL | Status: DC
  Administered 2017-11-14 – 2017-11-15 (×3): 500 mL via INTRAVENOUS
  Filled 2017-11-14 (×2): qty 500

## 2017-11-14 MED ORDER — SODIUM CHLORIDE 0.9 % IV SOLN
2.0000 g | Freq: Once | INTRAVENOUS | Status: AC
  Administered 2017-11-14: 2 g via INTRAVENOUS
  Filled 2017-11-14: qty 20

## 2017-11-14 MED ORDER — INSULIN ASPART PROT & ASPART (70-30 MIX) 100 UNIT/ML ~~LOC~~ SUSP
20.0000 [IU] | Freq: Every day | SUBCUTANEOUS | Status: DC
  Administered 2017-11-15 – 2017-11-17 (×3): 20 [IU] via SUBCUTANEOUS

## 2017-11-14 MED ORDER — CALCIUM CARBONATE ANTACID 500 MG PO CHEW
CHEWABLE_TABLET | ORAL | Status: AC
  Filled 2017-11-14: qty 1

## 2017-11-14 MED ORDER — ALBUTEROL SULFATE (2.5 MG/3ML) 0.083% IN NEBU: 2.5000 mg | INHALATION_SOLUTION | RESPIRATORY_TRACT | Status: DC | PRN

## 2017-11-14 NOTE — Progress Notes (Signed)
PROGRESS NOTE    William [name]  TDH:[id number] DOB: 1951-06-27 DOA: 11/06/2017 PCP: Gildardo Pounds, NP   Brief Narrative: William [name] is a 67 y.o. man PMHchronic kidney disease stage IV, cirrhosis, pancytopenia, diabetes mellitus, diastolic heart failure, presented with chronic cough, mild shortness of breath, edema. Has had extensive workup by pulmonology in the outpatient setting, admitted for acute on chronic hypoxic respiratory failure, acute on chronic diastolic heart failure, acute kidney injury superimposed on CKD stage IV. Concern for Goodpasture's disease    Assessment & Plan:   Principal Problem:   AKI (acute kidney injury) ([place]) Active Problems:   Type II diabetes mellitus with renal manifestations ([place])   Acute on chronic respiratory failure with hypoxia (HCC)   Acute on chronic diastolic CHF (congestive heart failure) (HCC)   Pancytopenia (HCC)   Acute renal failure superimposed on stage 4 chronic kidney disease ([place])   Acute hypoxemic respiratory failure (HCC)   Hemoptysis   1-Acute Renal failure; on chronic kidney diseases stage III;  Vs rapidly progressive glomerulonephritis. Good pasture syndrome; Getting IV solumedrol and plasmapheresis.  nephrologist contemplating renal biopsy during this admission.  Started HD 4-10. In process of evaluation of need for long term HD.  Getting plasmapheresis.  Plan for renal biopsy today  if INR lower.   2-Hemoptysis; improved.   3-Pancytopenia; discussed with Dr Julien Nordmann, this is probably reactive. Treat support care, transfusion as needed , Neupogen as needed.  For anemia, he is on aranesp.  He received 3 units PRBC during HD 4-10 WBC improved today,. normal ANC.  LFT increase bilirubin. LDH normal, peripheral smear pancytopenia. Haptoglobin NL Hb stable.   4-Cirrhosis; seen on CT scan. Might explain thrombocytopenia.  Hepatitis panel negative one moth ago.   5-Metabolic acidosis; on oral bicarb tablet.   6-DM;  type 2; 70/30;;increase NPH night coverage to 20 units and morning to 20 units.  SSI.   7-HTN; on norvasc.   DVT prophylaxis: scd Code Status: full code.  Family Communication: son at bedside.  Disposition Plan; home at time of discharge   Consultants:   Nephrology  CCM.   Oncology    Procedures:   Plasmapheresis.    Antimicrobials: none   Subjective: Cough better  He want a PCP   Objective: Vitals:   11/14/17 1110 11/14/17 1139 11/14/17 1410 11/14/17 1519  BP: (!) 163/81   (!) 162/82  Pulse: 80  80 86  Resp: 15  18 17   Temp: 98 F (36.7 C)     TempSrc: Oral     SpO2: 98% 93% 94% 94%  Weight:      Height:        Intake/Output Summary (Last 24 hours) at 11/14/2017 1643 Last data filed at 11/13/2017 1950 Gross per 24 hour  Intake 515.5 ml  Output -  Net 515.5 ml   Filed Weights   11/12/17 1345 11/13/17 0546 11/14/17 0500  Weight: 63.7 kg (140 lb 6.9 oz) 63.2 kg (139 lb 5.3 oz) 63 kg (138 lb 14.2 oz)    Examination:  General exam: NAD Respiratory system: Bilateral ronchus  Cardiovascular system: S 1, S  2 RRR Gastrointestinal system: BS present, soft, nt Central nervous system: non focal.   Extremities: Symmetric power.  Skin: no rash      Data Reviewed: I have personally reviewed following labs and imaging studies  CBC: Recent Labs  Lab 11/10/17 0412 11/11/17 0719 11/12/17 0258 11/12/17 0825 11/13/17 0405 11/14/17 0306 11/14/17 0856  WBC 2.4* 1.6* 2.6*  --  2.8* 3.2*  --   NEUTROABS  --   --  2.2  --  2.4 2.6  --   HGB 7.4* 7.0* 6.7* 7.8* 9.6* 9.2* 9.9*  HCT 23.0* 21.8* 21.2* 23.0* 28.6* 27.5* 29.0*  MCV 73.7* 73.6* 72.9*  --  75.5* 76.2*  --   PLT 60* 48* 51*  --  59* 55*  --    Basic Metabolic Panel: Recent Labs  Lab 11/10/17 0412 11/11/17 0719 11/12/17 0258 11/12/17 0825 11/13/17 0405 11/13/17 0808 11/14/17 0306 11/14/17 0856  NA 139 144 139 142 140  --  141 143  K 4.9 4.9 5.6* 4.7 5.0  --  4.1 4.1  CL 108 115* 109  107 105  --  104 100*  CO2 17* 14* 17*  --  23  --  24  --   GLUCOSE 178* 180* 458* 536* 418* 490* 135* 206*  BUN 131* 107* 123* 106* 61*  --  82* 71*  CREATININE 5.74* 4.88* 5.12* 5.40* 3.28*  --  3.77* 3.80*  CALCIUM 7.6* 7.5* 7.7*  --  7.8*  --  8.0*  --   PHOS 5.8* 5.4* 5.9*  --  4.8*  --  4.8*  --    GFR: Estimated Creatinine Clearance: 16.6 mL/min (A) (by C-G formula based on SCr of 3.8 mg/dL (H)). Liver Function Tests: Recent Labs  Lab 11/10/17 0412 11/11/17 0719 11/12/17 0258 11/13/17 0405 11/14/17 0306  AST  --   --   --  21  --   ALT  --   --   --  6*  --   ALKPHOS  --   --   --  32*  --   BILITOT  --   --   --  1.6*  --   PROT  --   --   --  6.0*  --   ALBUMIN 2.4* 4.8 3.9 4.9  4.9 4.6   No results for input(s): LIPASE, AMYLASE in the last 168 hours. No results for input(s): AMMONIA in the last 168 hours. Coagulation Profile: Recent Labs  Lab 11/13/17 0807 11/14/17 0306  INR 1.77 1.62   Cardiac Enzymes: No results for input(s): CKTOTAL, CKMB, CKMBINDEX, TROPONINI in the last 168 hours. BNP (last 3 results) Recent Labs    09/25/17 1614  PROBNP 380.0*   HbA1C: No results for input(s): HGBA1C in the last 72 hours. CBG: Recent Labs  Lab 11/13/17 1210 11/13/17 1703 11/13/17 2047 11/14/17 0802 11/14/17 1224  GLUCAP 407* 301* 167* 191* 204*   Lipid Profile: No results for input(s): CHOL, HDL, LDLCALC, TRIG, CHOLHDL, LDLDIRECT in the last 72 hours. Thyroid Function Tests: No results for input(s): TSH, T4TOTAL, FREET4, T3FREE, THYROIDAB in the last 72 hours. Anemia Panel: No results for input(s): VITAMINB12, FOLATE, FERRITIN, TIBC, IRON, RETICCTPCT in the last 72 hours. Sepsis Labs: No results for input(s): PROCALCITON, LATICACIDVEN in the last 168 hours.  No results found for this or any previous visit (from the past 240 hour(s)).       Radiology Studies: No results found.      Scheduled Meds: . albuterol  5 mg Nebulization TID  .  amLODipine  10 mg Oral Daily  . carvedilol  12.5 mg Oral BID WC  . ciprofloxacin-dexamethasone  4 drop Right EAR BID  . Darbepoetin Alfa  25 mcg Subcutaneous Q7 days  . ferrous gluconate  324 mg Oral Daily  . furosemide  40 mg Intravenous Daily  . insulin aspart  0-9 Units  Subcutaneous TID WC  . insulin aspart protamine- aspart  20 Units Subcutaneous Q supper  . insulin aspart protamine- aspart  20 Units Subcutaneous Q breakfast  . multivitamin with minerals  1 tablet Oral Daily  . pantoprazole  40 mg Oral Daily  . predniSONE  60 mg Oral Q breakfast  . sodium bicarbonate  1,300 mg Oral BID  . sodium chloride flush  3 mL Intravenous Q12H   Continuous Infusions: . sodium chloride 250 mL (11/10/17 1555)  . sodium chloride    . sodium chloride    . sodium chloride    . anticoagulant sodium citrate    . citrate dextrose 500 mL (11/14/17 1010)     LOS: 7 days    Time spent: 35 minutes.     Elmarie Shiley, MD [facility] Pager [phone]  If 7PM-7AM, please contact night-coverage [email or web address] Password [facility] 11/14/2017, 4:43 PM

## 2017-11-14 NOTE — Progress Notes (Signed)
Patient ID: William [name], male   DOB: 09/03/1950, 67 y.o.   MRN: [id number]  [facility] Progress Note   Assessment/ Plan:   1. 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  Ongoing plasmapheresis today (third treatment this week).  No indication for hemodialysis today and we will have a better determination as to whether he will need long-term hemodialysis over the weekend (this would prompt initiation of the CLIP process and pursuing long term HD access).  Discussed with Dr. Anselm Pancoast yesterday regarding safety of renal biopsy which was felt better deferred in the face of an elevated INR and thrombocytopenia-we will transfuse 2 units FFP at the end of plasmapheresis today to see if this may be able to facilitate safer biopsy.   2. Pancytopenia- seen by Hematology/Oncology with negative BM biopsy (4% plasma cells). Awaiting CBC from this morning, I have been apprehensive to start him on cyclophosphamide with his neutropenia. 3. Cirrhosis- unclear etiology, coincidentally seen on CT scan which further might be the reason for his thrombocytopenia with concomitant hypersplenism noted. Hepatitis testing was negative. 4. Anemia of CKD stage 4-without overt loss, continue ESA while in hospital. 5. Metabolic acidosis : Started on oral sodium bicarbonate, continue to monitor levels  6. HTN- stable 7. DM per primary  Subjective:   Without acute events overnight--currently on plasmapheresis without any complaints.   Objective:   BP (!) 163/77   Pulse 81   Temp 98 F (36.7 C) (Oral)   Resp 16   Ht 5' 5"  (1.651 m)   Wt 63 kg (138 lb 14.2 oz)   SpO2 90%   BMI 23.11 kg/m   Intake/Output Summary (Last 24 hours) at 11/14/2017 0944 Last data filed at 11/13/2017 1950 Gross per 24 hour   Intake 755.5 ml  Output -  Net 755.5 ml   Weight change: -1.6 kg (-3 lb 8.4 oz)  Physical Exam: Gen: Comfortable on plasmapheresis. CVS: Pulse regular rhythm, normal rate, S1 and S2 normal Resp: Fine rales both lungs, no rhonchi/wheeze Abd: Soft, flat, nontender Ext: No lower extremity edema  Imaging: No results found.  Labs: BMET Recent Labs  Lab 11/08/17 0310 11/09/17 8413 11/10/17 2440 11/11/17 0719 11/12/17 0258 11/12/17 0825 11/13/17 0405 11/13/17 0808 11/14/17 0306  NA 135 139 139 144 139 142 140  --  141  K 4.9 5.3* 4.9 4.9 5.6* 4.7 5.0  --  4.1  CL 105 107 108 115* 109 107 105  --  104  CO2 16* 17* 17* 14* 17*  --  23  --  24  GLUCOSE 258* 176* 178* 180* 458* 536* 418* 490* 135*  BUN 117* 124* 131* 107* 123* 106* 61*  --  82*  CREATININE 5.66* 5.70* 5.74* 4.88* 5.12* 5.40* 3.28*  --  3.77*  CALCIUM 7.6* 7.8* 7.6* 7.5* 7.7*  --  7.8*  --  8.0*  PHOS 6.1* 6.2* 5.8* 5.4* 5.9*  --  4.8*  --  4.8*   CBC Recent Labs  Lab 11/11/17 0719 11/12/17 0258 11/12/17 0825 11/13/17 0405 11/14/17 0306  WBC 1.6* 2.6*  --  2.8* 3.2*  NEUTROABS  --  2.2  --  2.4 2.6  HGB 7.0* 6.7* 7.8* 9.6* 9.2*  HCT 21.8* 21.2* 23.0* 28.6* 27.5*  MCV 73.6* 72.9*  --  75.5* 76.2*  PLT 48* 51*  --  59* 55*    Medications:    . albuterol  5 mg Nebulization TID  . amLODipine  10 mg Oral Daily  . calcium carbonate      . calcium carbonate  2 tablet Oral Q3H  . carvedilol  12.5 mg Oral BID WC  . ciprofloxacin-dexamethasone  4 drop Right EAR BID  . Darbepoetin Alfa  25 mcg Subcutaneous Q7 days  . ferrous gluconate  324 mg Oral Daily  . furosemide  40 mg Intravenous Daily  . insulin aspart  0-9 Units Subcutaneous TID WC  . insulin aspart protamine- aspart  20 Units Subcutaneous Q supper  . insulin aspart protamine- aspart  20 Units Subcutaneous Q breakfast  . multivitamin with minerals  1 tablet Oral Daily  . pantoprazole  40 mg Oral Daily  . predniSONE  60 mg Oral Q breakfast  .  sodium bicarbonate  1,300 mg Oral BID  . sodium chloride flush  3 mL Intravenous Q12H   Elmarie Shiley, MD 11/14/2017, 9:44 AM

## 2017-11-14 NOTE — Progress Notes (Signed)
Patient is back on the unit.

## 2017-11-14 NOTE — Progress Notes (Signed)
Called RN to see if pt can come down this morning for biopsy, RN states that pt is in dialysis. Advised RN we will check back this afternoon if IR schedule allows.

## 2017-11-14 NOTE — Plan of Care (Signed)
  Problem: Health Behavior/Discharge Planning: Goal: Ability to manage health-related needs will improve Outcome: Progressing  Pt and pts son educated on POC to perform renal biopsy tomorrow depending on pts lab values. Pt and son educated that pt is to be NPO after midnight for this procedure. Pt and son verbalized understanding.

## 2017-11-14 NOTE — Progress Notes (Signed)
Unable to perform Renal biopsy today d/t lab closing at 1700 and busy IR schedule. Explained to pt and pt family member who is at bedside. They verbalize understanding and are appreciative. Advised pt and family member that we will bring pt down Monday as IR schedule allows for renal biopsy. Dr Earleen Newport explained that renal biopsy can be performed as an outpatient procedure if patients primary dr deems appropriate for pt to go home over the weekend. Pt son verbalized understanding. Pt son concerned that pt has not been able to eat today and Dr Earleen Newport advised that pt should be allowed to eat when he is back to his room.

## 2017-11-14 NOTE — Progress Notes (Signed)
Patient is off the unit for renal biopsy.

## 2017-11-14 NOTE — Progress Notes (Signed)
Pt speaks no [place], pts son used to interpret. Pts family reported to NT that pt washed up in the bathroom last night...when RN asked pts family about washing up and getting a bath, pts son reported he did not wash up or get a bath yesterday. Pt/family has been refusing hygiene from staff.

## 2017-11-15 DIAGNOSIS — J9621 Acute and chronic respiratory failure with hypoxia: Secondary | ICD-10-CM | POA: Diagnosis not present

## 2017-11-15 DIAGNOSIS — I130 Hypertensive heart and chronic kidney disease with heart failure and stage 1 through stage 4 chronic kidney disease, or unspecified chronic kidney disease: Secondary | ICD-10-CM | POA: Diagnosis not present

## 2017-11-15 DIAGNOSIS — E1122 Type 2 diabetes mellitus with diabetic chronic kidney disease: Secondary | ICD-10-CM | POA: Diagnosis not present

## 2017-11-15 DIAGNOSIS — I129 Hypertensive chronic kidney disease with stage 1 through stage 4 chronic kidney disease, or unspecified chronic kidney disease: Secondary | ICD-10-CM | POA: Diagnosis not present

## 2017-11-15 DIAGNOSIS — N179 Acute kidney failure, unspecified: Secondary | ICD-10-CM | POA: Diagnosis not present

## 2017-11-15 DIAGNOSIS — I5033 Acute on chronic diastolic (congestive) heart failure: Secondary | ICD-10-CM | POA: Diagnosis not present

## 2017-11-15 DIAGNOSIS — D61818 Other pancytopenia: Secondary | ICD-10-CM | POA: Diagnosis not present

## 2017-11-15 DIAGNOSIS — Z23 Encounter for immunization: Secondary | ICD-10-CM | POA: Diagnosis not present

## 2017-11-15 DIAGNOSIS — R042 Hemoptysis: Secondary | ICD-10-CM | POA: Diagnosis not present

## 2017-11-15 DIAGNOSIS — R05 Cough: Secondary | ICD-10-CM | POA: Diagnosis not present

## 2017-11-15 DIAGNOSIS — N184 Chronic kidney disease, stage 4 (severe): Secondary | ICD-10-CM | POA: Diagnosis not present

## 2017-11-15 LAB — CBC
HEMATOCRIT: 30.1 % — AB (ref 39.0–52.0)
HEMOGLOBIN: 9.9 g/dL — AB (ref 13.0–17.0)
MCH: 24.8 pg — ABNORMAL LOW (ref 26.0–34.0)
MCHC: 32.9 g/dL (ref 30.0–36.0)
MCV: 75.3 fL — ABNORMAL LOW (ref 78.0–100.0)
Platelets: 67 10*3/uL — ABNORMAL LOW (ref 150–400)
RBC: 4 MIL/uL — ABNORMAL LOW (ref 4.22–5.81)
RDW: 16 % — ABNORMAL HIGH (ref 11.5–15.5)
WBC: 3.5 10*3/uL — ABNORMAL LOW (ref 4.0–10.5)

## 2017-11-15 LAB — RENAL FUNCTION PANEL
Albumin: 4.4 g/dL (ref 3.5–5.0)
Anion gap: 12 (ref 5–15)
BUN: 82 mg/dL — AB (ref 6–20)
CHLORIDE: 109 mmol/L (ref 101–111)
CO2: 21 mmol/L — AB (ref 22–32)
CREATININE: 3.54 mg/dL — AB (ref 0.61–1.24)
Calcium: 7.6 mg/dL — ABNORMAL LOW (ref 8.9–10.3)
GFR calc non Af Amer: 17 mL/min — ABNORMAL LOW (ref >60)
GFR, EST AFRICAN AMERICAN: 19 mL/min — AB (ref >60)
Glucose, Bld: 155 mg/dL — ABNORMAL HIGH (ref 65–99)
Phosphorus: 4.5 mg/dL (ref 2.5–4.6)
Potassium: 4.1 mmol/L (ref 3.5–5.1)
Sodium: 142 mmol/L (ref 135–145)

## 2017-11-15 LAB — BPAM FFP
BLOOD PRODUCT EXPIRATION DATE: 201904152359
Blood Product Expiration Date: 201904122359
ISSUE DATE / TIME: 201904121036
ISSUE DATE / TIME: 201904121036
UNIT TYPE AND RH: 7300
Unit Type and Rh: 6200

## 2017-11-15 LAB — PREPARE FRESH FROZEN PLASMA
UNIT DIVISION: 0
Unit division: 0

## 2017-11-15 LAB — GLUCOSE, CAPILLARY
GLUCOSE-CAPILLARY: 205 mg/dL — AB (ref 65–99)
GLUCOSE-CAPILLARY: 220 mg/dL — AB (ref 65–99)
Glucose-Capillary: 167 mg/dL — ABNORMAL HIGH (ref 65–99)
Glucose-Capillary: 303 mg/dL — ABNORMAL HIGH (ref 65–99)

## 2017-11-15 MED ORDER — CALCIUM CARBONATE ANTACID 500 MG PO CHEW
CHEWABLE_TABLET | ORAL | Status: AC
  Administered 2017-11-15: 400 mg
  Filled 2017-11-15: qty 2

## 2017-11-15 MED ORDER — ACD FORMULA A 0.73-2.45-2.2 GM/100ML VI SOLN
500.0000 mL | Status: DC
  Administered 2017-11-15: 500 mL via INTRAVENOUS

## 2017-11-15 MED ORDER — DIPHENHYDRAMINE HCL 25 MG PO CAPS: 25.0000 mg | ORAL_CAPSULE | Freq: Four times a day (QID) | ORAL | Status: DC | PRN

## 2017-11-15 MED ORDER — CALCIUM GLUCONATE 10 % IV SOLN
2.0000 g | Freq: Once | INTRAVENOUS | Status: DC
  Filled 2017-11-15: qty 20

## 2017-11-15 MED ORDER — ANTICOAGULANT SODIUM CITRATE 4% (200MG/5ML) IV SOLN: 5.0000 mL | Freq: Once | Status: DC

## 2017-11-15 MED ORDER — ACETAMINOPHEN 325 MG PO TABS: 650.0000 mg | ORAL_TABLET | ORAL | Status: DC | PRN

## 2017-11-15 MED ORDER — DARBEPOETIN ALFA 25 MCG/0.42ML IJ SOSY
25.0000 ug | PREFILLED_SYRINGE | INTRAMUSCULAR | Status: DC
  Filled 2017-11-15: qty 0.42

## 2017-11-15 MED ORDER — SODIUM CHLORIDE 0.9 % IV SOLN
2.0000 g | Freq: Once | INTRAVENOUS | Status: AC
  Administered 2017-11-15: 2 g via INTRAVENOUS
  Filled 2017-11-15: qty 20

## 2017-11-15 MED ORDER — SODIUM CHLORIDE 0.9 % IV SOLN
INTRAVENOUS | Status: AC
  Administered 2017-11-15 (×3): via INTRAVENOUS_CENTRAL
  Filled 2017-11-15 (×3): qty 200

## 2017-11-15 MED ORDER — ACD FORMULA A 0.73-2.45-2.2 GM/100ML VI SOLN
Status: AC
  Administered 2017-11-15: 500 mL via INTRAVENOUS
  Filled 2017-11-15: qty 500

## 2017-11-15 NOTE — Progress Notes (Signed)
PROGRESS NOTE    Raunak Antuna  ZMO:[id number] DOB: 06/24/1951 DOA: 11/06/2017 PCP: Gildardo Pounds, NP   Brief Narrative: William [name] is a 67 y.o. man PMHchronic kidney disease stage IV, cirrhosis, pancytopenia, diabetes mellitus, diastolic heart failure, presented with chronic cough, mild shortness of breath, edema. Has had extensive workup by pulmonology in the outpatient setting, admitted for acute on chronic hypoxic respiratory failure, acute on chronic diastolic heart failure, acute kidney injury superimposed on CKD stage IV. 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 Good pasture syndrome; Getting IV solumedrol and plasmapheresis.  nephrologist contemplating renal biopsy during this admission.  Started HD 4-10. In process of evaluation of need for long term HD.  Getting plasmapheresis.  Plan for renal biopsy on Monday.  No HD today.   2-Hemoptysis; improved.   3-Pancytopenia; discussed with Dr Julien Nordmann, this is probably reactive. Treat support care, transfusion as needed , Neupogen as needed.  For anemia, he is on aranesp.  He received 3 units PRBC during HD 4-10 WBC improved 3.5,,. normal ANC.  LFT increase bilirubin. LDH normal, peripheral smear pancytopenia. Haptoglobin NL Hb stable.   4-Cirrhosis; seen on CT scan. Might explain thrombocytopenia.  Hepatitis panel negative one moth ago.   5-Metabolic acidosis; on oral bicarb tablet.   6-DM;  type 2; 70/30;;increase NPH night coverage to 20 units and morning to 20 units.  SSI.   7-HTN; on norvasc.   DVT prophylaxis: scd Code Status: full code.  Family Communication: son at bedside.  Disposition Plan; home at time of discharge   Consultants:   Nephrology  CCM.   Oncology    Procedures:   Plasmapheresis.    Antimicrobials: none   Subjective: He is feeling better, denies worsening dyspnea. Cough improved.   Objective: Vitals:   11/14/17 1519 11/14/17 1938 11/15/17 0029 11/15/17 0732  BP: (!) 162/82  (!) 152/75 (!) 168/83  Pulse: 86  77 83  Resp: 17  16 18   Temp:   98.7 F (37.1 C) 98.4 F (36.9 C)  TempSrc:   Oral Oral  SpO2: 94% 98% 97% 97%  Weight:      Height:        Intake/Output Summary (Last 24 hours) at 11/15/2017 0835 Last data filed at 11/14/2017 2233 Gross per 24 hour  Intake 240 ml  Output -  Net 240 ml   Filed Weights   11/12/17 1345 11/13/17 0546 11/14/17 0500  Weight: 63.7 kg (140 lb 6.9 oz) 63.2 kg (139 lb 5.3 oz) 63 kg (138 lb 14.2 oz)    Examination:  General exam: NAD Respiratory system: Crackles bases.  Cardiovascular system: S 1, S 2 RRR Gastrointestinal system: BS present, soft, nt Central nervous system: Non focal.  Extremities: symmetric power.  Skin: No rash      Data Reviewed: I have personally reviewed following labs and imaging studies  CBC: Recent Labs  Lab 11/10/17 0354 11/11/17 0719 11/12/17 0258 11/12/17 0825 11/13/17 0405 11/14/17 6568 11/14/17 1275  WBC 2.4* 1.6* 2.6*  --  2.8* 3.2*  --   NEUTROABS  --   --  2.2  --  2.4 2.6  --   HGB 7.4* 7.0* 6.7* 7.8* 9.6* 9.2* 9.9*  HCT 23.0* 21.8* 21.2* 23.0* 28.6* 27.5* 29.0*  MCV 73.7* 73.6* 72.9*  --  75.5* 76.2*  --   PLT 60* 48* 51*  --  59* 55*  --    Basic Metabolic Panel: Recent Labs  Lab 11/11/17 0719 11/12/17 0258 11/12/17 0825 11/13/17 0405 11/13/17 0808 11/14/17 0306 11/14/17 0856 11/15/17 0258  NA 144 139 142 140  --  141 143  142  K 4.9 5.6* 4.7 5.0  --  4.1 4.1 4.1  CL 115* 109 107 105  --  104 100* 109  CO2 14* 17*  --  23  --  24  --  21*  GLUCOSE 180* 458* 536* 418* 490* 135* 206* 155*  BUN 107* 123* 106* 61*  --  82* 71* 82*  CREATININE 4.88* 5.12* 5.40* 3.28*  --  3.77* 3.80* 3.54*  CALCIUM 7.5* 7.7*  --  7.8*  --  8.0*  --  7.6*  PHOS 5.4* 5.9*  --  4.8*  --  4.8*  --  4.5   GFR: Estimated Creatinine Clearance: 17.9 mL/min (A) (by C-G formula based on SCr of 3.54 mg/dL (H)). Liver Function Tests: Recent Labs  Lab 11/11/17 0719 11/12/17 0258 11/13/17 0405 11/14/17 0306 11/15/17 0258  AST  --   --  21  --   --   ALT  --   --  6*  --   --   ALKPHOS  --   --  32*  --   --   BILITOT  --   --  1.6*  --   --   PROT  --   --  6.0*  --   --   ALBUMIN 4.8 3.9 4.9  4.9 4.6 4.4   No results for input(s): LIPASE, AMYLASE in the last 168 hours. No results for input(s): AMMONIA in the last 168 hours. Coagulation Profile: Recent Labs  Lab 11/13/17 0807 11/14/17 0306  INR 1.77 1.62   Cardiac Enzymes: No results for input(s): CKTOTAL, CKMB, CKMBINDEX, TROPONINI in the last 168 hours. BNP (last 3 results) Recent Labs    09/25/17 1614  PROBNP 380.0*   HbA1C: No results for input(s): HGBA1C in the last 72 hours. CBG: Recent Labs  Lab 11/14/17 0802 11/14/17 1224 11/14/17 1747 11/14/17 2104 11/15/17 0730  GLUCAP 191* 204* 173* 184* 167*   Lipid Profile: No results for input(s): CHOL, HDL, LDLCALC, TRIG, CHOLHDL, LDLDIRECT in the last 72 hours. Thyroid Function Tests: No results for input(s): TSH, T4TOTAL, FREET4, T3FREE, THYROIDAB in the last 72 hours. Anemia Panel: No results for input(s): VITAMINB12, FOLATE, FERRITIN, TIBC, IRON, RETICCTPCT in the last 72 hours. Sepsis Labs: No results for input(s): PROCALCITON, LATICACIDVEN in the last 168 hours.  No results found for this or any previous visit (from the past 240 hour(s)).       Radiology Studies: No results  found.      Scheduled Meds: . amLODipine  10 mg Oral Daily  . carvedilol  12.5 mg Oral BID WC  . ciprofloxacin-dexamethasone  4 drop Right EAR BID  . Darbepoetin Alfa  25 mcg Subcutaneous Q7 days  . ferrous gluconate  324 mg Oral Daily  . furosemide  40 mg Intravenous Daily  . insulin aspart  0-9 Units Subcutaneous  TID WC  . insulin aspart protamine- aspart  20 Units Subcutaneous Q supper  . insulin aspart protamine- aspart  20 Units Subcutaneous Q breakfast  . multivitamin with minerals  1 tablet Oral Daily  . pantoprazole  40 mg Oral Daily  . predniSONE  60 mg Oral Q breakfast  . sodium bicarbonate  1,300 mg Oral BID  . sodium chloride flush  3 mL Intravenous Q12H   Continuous Infusions: . sodium chloride 250 mL (11/10/17 1555)  . sodium chloride    . sodium chloride    . sodium chloride    . anticoagulant sodium citrate    . citrate dextrose 500 mL (11/14/17 1010)     LOS: 8 days    Time spent: 35 minutes.     Elmarie Shiley, MD [facility] Pager [phone]  If 7PM-7AM, please contact night-coverage [email or web address] Password [facility] 11/15/2017, 8:35 AM

## 2017-11-15 NOTE — Progress Notes (Signed)
I have assessed the patient throughout the day shift and agree with [facility]'s charting.

## 2017-11-15 NOTE — Progress Notes (Signed)
Patient ID: William [name], male   DOB: May 18, 1951, 67 y.o.   MRN: [id number] [facility] Progress Note   Assessment/ Plan:   1. 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  On schedule for plasmapheresis today-his fourth treatment of the 8 scheduled .  No indication for hemodialysis today.  Unable to get kidney biopsy yesterday due to busy schedule and this has been postponed until Monday schedule permitting.  No further hemoptysis.  Unable to start cyclophosphamide yet with neutropenia-we will start this if total WBC count >3.5K. 2. Pancytopenia- seen by Hematology/Oncology with negative BM biopsy (4% plasma cells). Awaiting CBC from this morning, I have been apprehensive to start him on cyclophosphamide with his neutropenia. 3. Cirrhosis- unclear etiology, coincidentally seen on CT scan which further might be the reason for his thrombocytopenia with concomitant hypersplenism noted. Hepatitis testing was negative. 4. Anemia of CKD stage 4-without overt loss, continue ESA while in hospital. 5. Metabolic acidosis : Started on oral sodium bicarbonate, continue to monitor levels  6. HTN- stable 7. DM per primary  Subjective:   Without acute events overnight--unable to get kidney biopsy yesterday due to busy I.R. schedule.  He denies any further hemoptysis.   Objective:   BP (!) 168/83   Pulse 83   Temp 98.4 F (36.9 C) (Oral)   Resp 18   Ht 5' 5"  (1.651 m)   Wt 63 kg (138 lb 14.2 oz)   SpO2 97%   BMI 23.11 kg/m   Intake/Output Summary (Last 24 hours) at 11/15/2017 0852 Last data filed at 11/14/2017 2233 Gross per 24 hour  Intake 240 ml  Output -  Net 240 ml   Weight change:   Physical Exam: Gen: Comfortable resting in bed CVS: Pulse regular rhythm, normal rate,  S1 and S2 normal Resp: Fine rales both lungs, no rhonchi/wheeze Abd: Soft, flat, nontender Ext: No lower extremity edema  Imaging: No results found.  Labs: BMET Recent Labs  Lab 11/09/17 4174 11/10/17 0814 11/11/17 0719 11/12/17 0258 11/12/17 0825 11/13/17 0405 11/13/17 0808 11/14/17 0306 11/14/17 0856 11/15/17 0258  NA 139 139 144 139 142 140  --  141 143 142  K 5.3* 4.9 4.9 5.6* 4.7 5.0  --  4.1 4.1 4.1  CL 107 108 115* 109 107 105  --  104 100* 109  CO2 17* 17* 14* 17*  --  23  --  24  --  21*  GLUCOSE 176* 178* 180* 458* 536* 418* 490* 135* 206* 155*  BUN 124* 131* 107* 123* 106* 61*  --  82* 71* 82*  CREATININE 5.70* 5.74* 4.88* 5.12* 5.40* 3.28*  --  3.77* 3.80* 3.54*  CALCIUM 7.8* 7.6* 7.5* 7.7*  --  7.8*  --  8.0*  --  7.6*  PHOS 6.2* 5.8* 5.4* 5.9*  --  4.8*  --  4.8*  --  4.5   CBC Recent Labs  Lab 11/11/17 0719 11/12/17 0258 11/12/17 0825 11/13/17 0405 11/14/17 0306 11/14/17 0856  WBC 1.6* 2.6*  --  2.8* 3.2*  --   NEUTROABS  --  2.2  --  2.4 2.6  --   HGB 7.0* 6.7* 7.8* 9.6* 9.2* 9.9*  HCT 21.8* 21.2* 23.0* 28.6* 27.5* 29.0*  MCV 73.6* 72.9*  --  75.5* 76.2*  --   PLT 48* 51*  --  59* 55*  --     Medications:    . amLODipine  10 mg Oral Daily  . carvedilol  12.5 mg Oral BID WC  . ciprofloxacin-dexamethasone  4 drop Right EAR BID  . Darbepoetin Alfa  25 mcg Subcutaneous Q7 days  . ferrous gluconate  324 mg Oral Daily  . furosemide  40 mg Intravenous Daily  . insulin aspart  0-9 Units Subcutaneous TID WC  . insulin aspart protamine- aspart  20 Units Subcutaneous Q supper  . insulin aspart protamine- aspart  20 Units Subcutaneous Q breakfast  . multivitamin with minerals  1 tablet Oral Daily  . pantoprazole  40 mg Oral Daily  . predniSONE  60 mg Oral Q breakfast  . sodium bicarbonate  1,300 mg Oral BID  . sodium chloride flush  3 mL Intravenous Q12H   Elmarie Shiley, MD 11/15/2017, 8:52 AM

## 2017-11-16 DIAGNOSIS — R05 Cough: Secondary | ICD-10-CM | POA: Diagnosis not present

## 2017-11-16 DIAGNOSIS — D61818 Other pancytopenia: Secondary | ICD-10-CM | POA: Diagnosis not present

## 2017-11-16 DIAGNOSIS — E1122 Type 2 diabetes mellitus with diabetic chronic kidney disease: Secondary | ICD-10-CM | POA: Diagnosis not present

## 2017-11-16 DIAGNOSIS — R042 Hemoptysis: Secondary | ICD-10-CM | POA: Diagnosis not present

## 2017-11-16 DIAGNOSIS — I129 Hypertensive chronic kidney disease with stage 1 through stage 4 chronic kidney disease, or unspecified chronic kidney disease: Secondary | ICD-10-CM | POA: Diagnosis not present

## 2017-11-16 DIAGNOSIS — I130 Hypertensive heart and chronic kidney disease with heart failure and stage 1 through stage 4 chronic kidney disease, or unspecified chronic kidney disease: Secondary | ICD-10-CM | POA: Diagnosis not present

## 2017-11-16 DIAGNOSIS — N184 Chronic kidney disease, stage 4 (severe): Secondary | ICD-10-CM | POA: Diagnosis not present

## 2017-11-16 DIAGNOSIS — N179 Acute kidney failure, unspecified: Secondary | ICD-10-CM | POA: Diagnosis not present

## 2017-11-16 DIAGNOSIS — J9621 Acute and chronic respiratory failure with hypoxia: Secondary | ICD-10-CM | POA: Diagnosis not present

## 2017-11-16 DIAGNOSIS — Z23 Encounter for immunization: Secondary | ICD-10-CM | POA: Diagnosis not present

## 2017-11-16 DIAGNOSIS — I5033 Acute on chronic diastolic (congestive) heart failure: Secondary | ICD-10-CM | POA: Diagnosis not present

## 2017-11-16 LAB — RENAL FUNCTION PANEL
ALBUMIN: 4.5 g/dL (ref 3.5–5.0)
ANION GAP: 10 (ref 5–15)
BUN: 78 mg/dL — ABNORMAL HIGH (ref 6–20)
CALCIUM: 7.5 mg/dL — AB (ref 8.9–10.3)
CO2: 21 mmol/L — AB (ref 22–32)
CREATININE: 3.34 mg/dL — AB (ref 0.61–1.24)
Chloride: 110 mmol/L (ref 101–111)
GFR calc non Af Amer: 18 mL/min — ABNORMAL LOW (ref >60)
GFR, EST AFRICAN AMERICAN: 21 mL/min — AB (ref >60)
Glucose, Bld: 237 mg/dL — ABNORMAL HIGH (ref 65–99)
PHOSPHORUS: 4.4 mg/dL (ref 2.5–4.6)
Potassium: 4 mmol/L (ref 3.5–5.1)
SODIUM: 141 mmol/L (ref 135–145)

## 2017-11-16 LAB — CBC
HCT: 29.8 % — ABNORMAL LOW (ref 39.0–52.0)
Hemoglobin: 10 g/dL — ABNORMAL LOW (ref 13.0–17.0)
MCH: 25.5 pg — AB (ref 26.0–34.0)
MCHC: 33.6 g/dL (ref 30.0–36.0)
MCV: 76 fL — ABNORMAL LOW (ref 78.0–100.0)
Platelets: 53 10*3/uL — ABNORMAL LOW (ref 150–400)
RBC: 3.92 MIL/uL — AB (ref 4.22–5.81)
RDW: 16.5 % — ABNORMAL HIGH (ref 11.5–15.5)
WBC: 3.4 10*3/uL — ABNORMAL LOW (ref 4.0–10.5)

## 2017-11-16 LAB — GLUCOSE, CAPILLARY
GLUCOSE-CAPILLARY: 217 mg/dL — AB (ref 65–99)
GLUCOSE-CAPILLARY: 226 mg/dL — AB (ref 65–99)
Glucose-Capillary: 163 mg/dL — ABNORMAL HIGH (ref 65–99)
Glucose-Capillary: 135 mg/dL — ABNORMAL HIGH (ref 65–99)

## 2017-11-16 MED ORDER — CYCLOPHOSPHAMIDE 50 MG PO CAPS
100.0000 mg | ORAL_CAPSULE | Freq: Every day | ORAL | Status: DC
  Administered 2017-11-16 – 2017-11-18 (×3): 100 mg via ORAL
  Filled 2017-11-16 (×3): qty 2

## 2017-11-16 NOTE — Progress Notes (Signed)
Patient ID: William [name], male   DOB: 14-Jul-1951, 67 y.o.   MRN: [id number] [facility] Progress Note   Assessment/ Plan:   1. 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  Underwent his fourth plasmapheresis yesterday with his 5 out of 8 scheduled for tomorrow.  Labs continue to show slow/meaningful renal recovery.  Planned for kidney biopsy by IR tomorrow (schedule permitting).  With current WBC count, will start cyclophosphamide 100 mg daily (ideally 2 mg/kilogram recommended however at his age and with his leukopenia, using a lower dose). 2. Pancytopenia- seen by Hematology/Oncology with negative BM biopsy (4% plasma cells).  Total WBC yesterday and today around 3500- will start cyclophosphamide. 3. Cirrhosis- unclear etiology, coincidentally seen on CT scan which further might be the reason for his thrombocytopenia with concomitant hypersplenism noted. Hepatitis testing was negative. 4. Anemia of CKD stage 4-without overt loss, continue ESA while in hospital. 5. Metabolic acidosis : Started on oral sodium bicarbonate, continue to monitor levels  6. HTN- stable 7. DM per primary  Subjective:   Tolerated plasmapheresis yesterday without problems-no acute events overnight.   Objective:   BP (!) 153/80 (BP Location: Left Arm)   Pulse 78   Temp 98.5 F (36.9 C) (Oral)   Resp 16   Ht 5' 5"  (1.651 m)   Wt 69.3 kg (152 lb 12.5 oz)   SpO2 100%   BMI 25.42 kg/m   Intake/Output Summary (Last 24 hours) at 11/16/2017 0931 Last data filed at 11/15/2017 1540 Gross per 24 hour  Intake 275 ml  Output -  Net 275 ml   Weight change:   Physical Exam: Gen: Comfortable sleeping in bed CVS: Pulse regular rhythm, normal rate, S1 and S2 normal Resp: Fine rales both lungs, no  rhonchi/wheeze Abd: Soft, mild distention, nontender, normal bowel sounds Ext: No lower extremity edema  Imaging: No results found.  Labs: BMET Recent Labs  Lab 11/10/17 0412 11/11/17 0719 11/12/17 0258 11/12/17 0825 11/13/17 0405 11/13/17 0808 11/14/17 0306 11/14/17 0856 11/15/17 0258 11/16/17 0158  NA 139 144 139 142 140  --  141 143 142 141  K 4.9 4.9 5.6* 4.7 5.0  --  4.1 4.1 4.1 4.0  CL 108 115* 109 107 105  --  104 100* 109 110  CO2 17* 14* 17*  --  23  --  24  --  21* 21*  GLUCOSE 178* 180* 458* 536* 418* 490* 135* 206* 155* 237*  BUN 131* 107* 123* 106* 61*  --  82* 71* 82* 78*  CREATININE 5.74* 4.88* 5.12* 5.40* 3.28*  --  3.77* 3.80* 3.54* 3.34*  CALCIUM 7.6* 7.5* 7.7*  --  7.8*  --  8.0*  --  7.6* 7.5*  PHOS 5.8* 5.4* 5.9*  --  4.8*  --  4.8*  --  4.5 4.4   CBC Recent Labs  Lab 11/12/17 0258  11/13/17 0405 11/14/17 0306 11/14/17 0856 11/15/17 0754 11/16/17 0158  WBC 2.6*  --  2.8* 3.2*  --  3.5* 3.4*  NEUTROABS 2.2  --  2.4 2.6  --   --   --   HGB 6.7*   < > 9.6* 9.2* 9.9* 9.9* 10.0*  HCT 21.2*   < > 28.6* 27.5* 29.0* 30.1* 29.8*  MCV 72.9*  --  75.5* 76.2*  --  75.3* 76.0*  PLT 51*  --  59* 55*  --  67* 53*   < > = values in this interval not displayed.    Medications:    . amLODipine  10 mg Oral Daily  . carvedilol  12.5 mg Oral BID WC  . ciprofloxacin-dexamethasone  4 drop Right EAR BID  . [START ON 11/18/2017] Darbepoetin Alfa  25 mcg Subcutaneous Q Tue-HD  . ferrous gluconate  324 mg Oral Daily  . furosemide  40 mg Intravenous Daily  . insulin aspart  0-9 Units Subcutaneous TID WC  . insulin aspart protamine- aspart  20 Units Subcutaneous Q supper  . insulin aspart protamine- aspart  20 Units Subcutaneous Q breakfast  . multivitamin with minerals  1 tablet Oral Daily  . pantoprazole  40 mg Oral Daily  . predniSONE  60 mg Oral Q breakfast  . sodium bicarbonate  1,300 mg Oral BID  . sodium chloride flush  3 mL Intravenous Q12H   Elmarie Shiley, MD 11/16/2017, 9:31 AM

## 2017-11-16 NOTE — Plan of Care (Signed)
Patient progressing, not experiencing complications related to bowel motility.

## 2017-11-16 NOTE — Progress Notes (Signed)
PROGRESS NOTE    William [name]  DGL:[id number] DOB: Jan 18, 1951 DOA: 11/06/2017 PCP: Gildardo Pounds, NP   Brief Narrative: William [name] is a 67 y.o. man PMHchronic kidney disease stage IV, cirrhosis, pancytopenia, diabetes mellitus, diastolic heart failure, presented with chronic cough, mild shortness of breath, edema. Has had extensive workup by pulmonology in the outpatient setting, admitted for acute on chronic hypoxic respiratory failure, acute on chronic diastolic heart failure, acute kidney injury superimposed on CKD stage IV. Concern for Goodpasture's disease    Assessment & Plan:   Principal Problem:   AKI (acute kidney injury) ([place]) Active Problems:   Type II diabetes mellitus with renal manifestations ([place])   Acute on chronic respiratory failure with hypoxia (HCC)   Acute on chronic diastolic CHF (congestive heart failure) (HCC)   Pancytopenia (HCC)   Acute renal failure superimposed on stage 4 chronic kidney disease ([place])   Acute hypoxemic respiratory failure (HCC)   Hemoptysis   1-Acute Renal failure; on chronic kidney diseases stage III;  Vs rapidly progressive glomerulonephritis. [place] pasture syndrome; Received  IV solumedrol and getting plasmapheresis.  nephrologist contemplating renal biopsy during this admission.  Started HD 4-10. In process of evaluation of need for long term HD.  Getting plasmapheresis.  Plan for renal biopsy on Monday.  No HD today.  Nephrology will start cyclophosphamide.  Biopsy tomorrow.   2-Hemoptysis; . Resolved.   3-Pancytopenia; discussed with Dr Julien Nordmann, this is probably reactive. Treat support care, transfusion as needed , Neupogen as needed.  For anemia, he is on aranesp.  He received 3 units PRBC during HD 4-10 WBC improved 3.5,,. normal ANC.  LFT increase bilirubin. LDH normal, peripheral smear pancytopenia. Haptoglobin NL Hb stable.  WBC increasing.   4-Cirrhosis; seen on CT scan. Might explain thrombocytopenia.    Hepatitis panel negative one moth ago.   5-Metabolic acidosis; on oral bicarb tablet.   6-DM; type 2; 70/30;;increase NPH night coverage to 20 units and morning to 20 units.  SSI.   7-HTN; on norvasc.   DVT prophylaxis: scd Code Status: full code.  Family Communication: son at bedside.  Disposition Plan; home at time of discharge   Consultants:   Nephrology  CCM.   Oncology    Procedures:   Plasmapheresis.    Antimicrobials: none   Subjective: Cough better, breathing better   Objective: Vitals:   11/15/17 1606 11/15/17 2338 11/16/17 0600 11/16/17 1007  BP: (!) 167/79 (!) 153/80  (!) 164/81  Pulse: 87 78  80  Resp: 18 16  16   Temp: 97.9 F (36.6 C) 98.5 F (36.9 C)  98 F (36.7 C)  TempSrc: Oral Oral  Oral  SpO2: 100% 100%  96%  Weight:   69.3 kg (152 lb 12.5 oz)   Height:       No intake or output data in the 24 hours ending 11/16/17 1545 Filed Weights   11/13/17 0546 11/14/17 0500 11/16/17 0600  Weight: 63.2 kg (139 lb 5.3 oz) 63 kg (138 lb 14.2 oz) 69.3 kg (152 lb 12.5 oz)    Examination:  General exam: NAD Respiratory system: Normal respiratory effort.  Cardiovascular system: S 1, S 2 RRR Gastrointestinal system: BS present, soft, nt Central nervous system: non focal.  Extremities: Symmetric power.  Skin: No rash      Data Reviewed: I have personally reviewed following labs and imaging studies  CBC: Recent Labs  Lab 11/12/17 0258  11/13/17 0405 11/14/17 0306 11/14/17 0856 11/15/17 0754 11/16/17 0158  WBC  2.6*  --  2.8* 3.2*  --  3.5* 3.4*  NEUTROABS 2.2  --  2.4 2.6  --   --   --   HGB 6.7*   < > 9.6* 9.2* 9.9* 9.9* 10.0*  HCT 21.2*   < > 28.6* 27.5* 29.0* 30.1* 29.8*  MCV 72.9*  --  75.5* 76.2*  --  75.3* 76.0*  PLT 51*  --  59* 55*  --  67* 53*   < > = values in this interval not displayed.   Basic Metabolic Panel: Recent Labs  Lab 11/12/17 0258  11/13/17 0405 11/13/17 4098 11/14/17 0306 11/14/17 0856 11/15/17 0258  11/16/17 0158  NA 139   < > 140  --  141 143 142 141  K 5.6*   < > 5.0  --  4.1 4.1 4.1 4.0  CL 109   < > 105  --  104 100* 109 110  CO2 17*  --  23  --  24  --  21* 21*  GLUCOSE 458*   < > 418* 490* 135* 206* 155* 237*  BUN 123*   < > 61*  --  82* 71* 82* 78*  CREATININE 5.12*   < > 3.28*  --  3.77* 3.80* 3.54* 3.34*  CALCIUM 7.7*  --  7.8*  --  8.0*  --  7.6* 7.5*  PHOS 5.9*  --  4.8*  --  4.8*  --  4.5 4.4   < > = values in this interval not displayed.   GFR: Estimated Creatinine Clearance: 18.9 mL/min (A) (by C-G formula based on SCr of 3.34 mg/dL (H)). Liver Function Tests: Recent Labs  Lab 11/12/17 0258 11/13/17 0405 11/14/17 0306 11/15/17 0258 11/16/17 0158  AST  --  21  --   --   --   ALT  --  6*  --   --   --   ALKPHOS  --  32*  --   --   --   BILITOT  --  1.6*  --   --   --   PROT  --  6.0*  --   --   --   ALBUMIN 3.9 4.9  4.9 4.6 4.4 4.5   No results for input(s): LIPASE, AMYLASE in the last 168 hours. No results for input(s): AMMONIA in the last 168 hours. Coagulation Profile: Recent Labs  Lab 11/13/17 0807 11/14/17 0306  INR 1.77 1.62   Cardiac Enzymes: No results for input(s): CKTOTAL, CKMB, CKMBINDEX, TROPONINI in the last 168 hours. BNP (last 3 results) Recent Labs    09/25/17 1614  PROBNP 380.0*   HbA1C: No results for input(s): HGBA1C in the last 72 hours. CBG: Recent Labs  Lab 11/15/17 1216 11/15/17 1603 11/15/17 2123 11/16/17 0747 11/16/17 1154  GLUCAP 303* 205* 220* 217* 163*   Lipid Profile: No results for input(s): CHOL, HDL, LDLCALC, TRIG, CHOLHDL, LDLDIRECT in the last 72 hours. Thyroid Function Tests: No results for input(s): TSH, T4TOTAL, FREET4, T3FREE, THYROIDAB in the last 72 hours. Anemia Panel: No results for input(s): VITAMINB12, FOLATE, FERRITIN, TIBC, IRON, RETICCTPCT in the last 72 hours. Sepsis Labs: No results for input(s): PROCALCITON, LATICACIDVEN in the last 168 hours.  No results found for this or any  previous visit (from the past 240 hour(s)).       Radiology Studies: No results found.      Scheduled Meds: . amLODipine  10 mg Oral Daily  . carvedilol  12.5 mg Oral BID WC  .  ciprofloxacin-dexamethasone  4 drop Right EAR BID  . cyclophosphamide  100 mg Oral Daily  . [START ON 11/18/2017] Darbepoetin Alfa  25 mcg Subcutaneous Q Tue-HD  . ferrous gluconate  324 mg Oral Daily  . furosemide  40 mg Intravenous Daily  . insulin aspart  0-9 Units Subcutaneous TID WC  . insulin aspart protamine- aspart  20 Units Subcutaneous Q supper  . insulin aspart protamine- aspart  20 Units Subcutaneous Q breakfast  . multivitamin with minerals  1 tablet Oral Daily  . pantoprazole  40 mg Oral Daily  . predniSONE  60 mg Oral Q breakfast  . sodium bicarbonate  1,300 mg Oral BID  . sodium chloride flush  3 mL Intravenous Q12H   Continuous Infusions: . sodium chloride 250 mL (11/10/17 1555)  . sodium chloride    . sodium chloride    . sodium chloride       LOS: 9 days    Time spent: 35 minutes.     Elmarie Shiley, MD [facility] Pager [phone]  If 7PM-7AM, please contact night-coverage [email or web address] Password [email or web address] 11/16/2017, 3:45 PM

## 2017-11-17 ENCOUNTER — Inpatient Hospital Stay (HOSPITAL_COMMUNITY): Payer: Medicare Other

## 2017-11-17 DIAGNOSIS — D61818 Other pancytopenia: Secondary | ICD-10-CM | POA: Diagnosis not present

## 2017-11-17 DIAGNOSIS — E1122 Type 2 diabetes mellitus with diabetic chronic kidney disease: Secondary | ICD-10-CM | POA: Diagnosis not present

## 2017-11-17 DIAGNOSIS — Z23 Encounter for immunization: Secondary | ICD-10-CM | POA: Diagnosis not present

## 2017-11-17 DIAGNOSIS — R05 Cough: Secondary | ICD-10-CM | POA: Diagnosis not present

## 2017-11-17 DIAGNOSIS — I5033 Acute on chronic diastolic (congestive) heart failure: Secondary | ICD-10-CM | POA: Diagnosis not present

## 2017-11-17 DIAGNOSIS — I129 Hypertensive chronic kidney disease with stage 1 through stage 4 chronic kidney disease, or unspecified chronic kidney disease: Secondary | ICD-10-CM | POA: Diagnosis not present

## 2017-11-17 DIAGNOSIS — N179 Acute kidney failure, unspecified: Secondary | ICD-10-CM | POA: Diagnosis not present

## 2017-11-17 DIAGNOSIS — J9621 Acute and chronic respiratory failure with hypoxia: Secondary | ICD-10-CM | POA: Diagnosis not present

## 2017-11-17 DIAGNOSIS — R042 Hemoptysis: Secondary | ICD-10-CM | POA: Diagnosis not present

## 2017-11-17 DIAGNOSIS — I130 Hypertensive heart and chronic kidney disease with heart failure and stage 1 through stage 4 chronic kidney disease, or unspecified chronic kidney disease: Secondary | ICD-10-CM | POA: Diagnosis not present

## 2017-11-17 DIAGNOSIS — N184 Chronic kidney disease, stage 4 (severe): Secondary | ICD-10-CM | POA: Diagnosis not present

## 2017-11-17 LAB — POCT I-STAT, CHEM 8
BUN: 68 mg/dL — AB (ref 6–20)
CALCIUM ION: 0.75 mmol/L — AB (ref 1.15–1.40)
CREATININE: 3.6 mg/dL — AB (ref 0.61–1.24)
Chloride: 101 mmol/L (ref 101–111)
Glucose, Bld: 257 mg/dL — ABNORMAL HIGH (ref 65–99)
HCT: 31 % — ABNORMAL LOW (ref 39.0–52.0)
Hemoglobin: 10.5 g/dL — ABNORMAL LOW (ref 13.0–17.0)
Potassium: 3.7 mmol/L (ref 3.5–5.1)
Sodium: 145 mmol/L (ref 135–145)
TCO2: 21 mmol/L — AB (ref 22–32)

## 2017-11-17 LAB — PROTIME-INR
INR: 1.75
PROTHROMBIN TIME: 20.3 s — AB (ref 11.4–15.2)

## 2017-11-17 LAB — CBC WITH DIFFERENTIAL/PLATELET
BASOS PCT: 0 %
Basophils Absolute: 0 10*3/uL (ref 0.0–0.1)
EOS ABS: 0 10*3/uL (ref 0.0–0.7)
Eosinophils Relative: 1 %
HCT: 29.7 % — ABNORMAL LOW (ref 39.0–52.0)
HEMOGLOBIN: 9.8 g/dL — AB (ref 13.0–17.0)
Lymphocytes Relative: 13 %
Lymphs Abs: 0.4 10*3/uL — ABNORMAL LOW (ref 0.7–4.0)
MCH: 25 pg — ABNORMAL LOW (ref 26.0–34.0)
MCHC: 33 g/dL (ref 30.0–36.0)
MCV: 75.8 fL — ABNORMAL LOW (ref 78.0–100.0)
MONOS PCT: 7 %
Monocytes Absolute: 0.2 10*3/uL (ref 0.1–1.0)
NEUTROS PCT: 79 %
Neutro Abs: 2.7 10*3/uL (ref 1.7–7.7)
Platelets: 64 10*3/uL — ABNORMAL LOW (ref 150–400)
RBC: 3.92 MIL/uL — ABNORMAL LOW (ref 4.22–5.81)
RDW: 16.6 % — AB (ref 11.5–15.5)
WBC: 3.4 10*3/uL — ABNORMAL LOW (ref 4.0–10.5)

## 2017-11-17 LAB — RENAL FUNCTION PANEL
ALBUMIN: 4.2 g/dL (ref 3.5–5.0)
Anion gap: 12 (ref 5–15)
BUN: 81 mg/dL — ABNORMAL HIGH (ref 6–20)
CO2: 22 mmol/L (ref 22–32)
CREATININE: 3.33 mg/dL — AB (ref 0.61–1.24)
Calcium: 7.9 mg/dL — ABNORMAL LOW (ref 8.9–10.3)
Chloride: 107 mmol/L (ref 101–111)
GFR calc Af Amer: 21 mL/min — ABNORMAL LOW (ref >60)
GFR calc non Af Amer: 18 mL/min — ABNORMAL LOW (ref >60)
GLUCOSE: 153 mg/dL — AB (ref 65–99)
PHOSPHORUS: 4.4 mg/dL (ref 2.5–4.6)
Potassium: 3.9 mmol/L (ref 3.5–5.1)
SODIUM: 141 mmol/L (ref 135–145)

## 2017-11-17 LAB — GLUCOSE, CAPILLARY
GLUCOSE-CAPILLARY: 218 mg/dL — AB (ref 65–99)
Glucose-Capillary: 45 mg/dL — ABNORMAL LOW (ref 65–99)
Glucose-Capillary: 111 mg/dL — ABNORMAL HIGH (ref 65–99)
Glucose-Capillary: 183 mg/dL — ABNORMAL HIGH (ref 65–99)
Glucose-Capillary: 225 mg/dL — ABNORMAL HIGH (ref 65–99)
Glucose-Capillary: 217 mg/dL — ABNORMAL HIGH (ref 65–99)

## 2017-11-17 MED ORDER — DIPHENHYDRAMINE HCL 25 MG PO CAPS
ORAL_CAPSULE | ORAL | Status: AC
  Filled 2017-11-17: qty 1

## 2017-11-17 MED ORDER — FENTANYL CITRATE (PF) 100 MCG/2ML IJ SOLN
INTRAMUSCULAR | Status: AC
  Filled 2017-11-17: qty 2

## 2017-11-17 MED ORDER — SODIUM CHLORIDE 0.9 % IV SOLN
INTRAVENOUS | Status: AC | PRN
  Administered 2017-11-17: 10 mL/h via INTRAVENOUS

## 2017-11-17 MED ORDER — MIDAZOLAM HCL 2 MG/2ML IJ SOLN
INTRAMUSCULAR | Status: AC
  Filled 2017-11-17: qty 2

## 2017-11-17 MED ORDER — SODIUM CHLORIDE 0.9 % IV SOLN: Freq: Once | INTRAVENOUS | Status: DC

## 2017-11-17 MED ORDER — ACD FORMULA A 0.73-2.45-2.2 GM/100ML VI SOLN
Status: AC
  Filled 2017-11-17: qty 1000

## 2017-11-17 MED ORDER — ANTICOAGULANT SODIUM CITRATE 4% (200MG/5ML) IV SOLN
5.0000 mL | Freq: Once | Status: AC
  Administered 2017-11-17: 5 mL
  Filled 2017-11-17 (×5): qty 250

## 2017-11-17 MED ORDER — ALBUMIN HUMAN 25 % IV SOLN
INTRAVENOUS | Status: AC
  Administered 2017-11-17 (×3): via INTRAVENOUS_CENTRAL
  Filled 2017-11-17 (×3): qty 200

## 2017-11-17 MED ORDER — INSULIN ASPART PROT & ASPART (70-30 MIX) 100 UNIT/ML ~~LOC~~ SUSP
18.0000 [IU] | Freq: Every day | SUBCUTANEOUS | Status: DC
  Administered 2017-11-18: 18 [IU] via SUBCUTANEOUS

## 2017-11-17 MED ORDER — LIDOCAINE-EPINEPHRINE 1 %-1:100000 IJ SOLN
INTRAMUSCULAR | Status: AC
  Filled 2017-11-17: qty 1

## 2017-11-17 MED ORDER — ACETAMINOPHEN 325 MG PO TABS: 650.0000 mg | ORAL_TABLET | ORAL | Status: DC | PRN

## 2017-11-17 MED ORDER — INSULIN ASPART PROT & ASPART (70-30 MIX) 100 UNIT/ML ~~LOC~~ SUSP
18.0000 [IU] | Freq: Every day | SUBCUTANEOUS | Status: DC
  Administered 2017-11-17: 18 [IU] via SUBCUTANEOUS

## 2017-11-17 MED ORDER — CALCIUM GLUCONATE 10 % IV SOLN
2.0000 g | Freq: Once | INTRAVENOUS | Status: AC
  Administered 2017-11-17: 2 g via INTRAVENOUS
  Filled 2017-11-17: qty 20

## 2017-11-17 MED ORDER — FENTANYL CITRATE (PF) 100 MCG/2ML IJ SOLN
INTRAMUSCULAR | Status: AC | PRN
  Administered 2017-11-17 (×2): 50 ug via INTRAVENOUS

## 2017-11-17 MED ORDER — CALCIUM CARBONATE ANTACID 500 MG PO CHEW
CHEWABLE_TABLET | ORAL | Status: AC
  Administered 2017-11-17: 400 mg
  Filled 2017-11-17: qty 2

## 2017-11-17 MED ORDER — ACD FORMULA A 0.73-2.45-2.2 GM/100ML VI SOLN
500.0000 mL | Status: DC
  Administered 2017-11-17 (×2): 500 mL via INTRAVENOUS
  Filled 2017-11-17: qty 500

## 2017-11-17 MED ORDER — LIDOCAINE HCL (PF) 1 % IJ SOLN
INTRAMUSCULAR | Status: AC
  Filled 2017-11-17: qty 30

## 2017-11-17 MED ORDER — MIDAZOLAM HCL 2 MG/2ML IJ SOLN
INTRAMUSCULAR | Status: AC | PRN
  Administered 2017-11-17 (×2): 1 mg via INTRAVENOUS

## 2017-11-17 MED ORDER — ACETAMINOPHEN 325 MG PO TABS
ORAL_TABLET | ORAL | Status: AC
  Filled 2017-11-17: qty 2

## 2017-11-17 MED ORDER — DIPHENHYDRAMINE HCL 25 MG PO CAPS
25.0000 mg | ORAL_CAPSULE | Freq: Four times a day (QID) | ORAL | Status: DC | PRN
  Administered 2017-11-17: 25 mg via ORAL

## 2017-11-17 NOTE — Sedation Documentation (Signed)
Patient is resting comfortably. 

## 2017-11-17 NOTE — Plan of Care (Addendum)
No acute events overnight.

## 2017-11-17 NOTE — Care Management Note (Signed)
Case Management Note  Patient Details  Name: William [name] MRN: [id number] Date of Birth: 12-Mar-1951  Subjective/Objective:     AKI, Pancytopenia, Cirrhosis               Action/Plan: NCM spoke to pt and son at bedside. Pt does not speak [place]. Son was his interpreter. Pt lives with son. States he does not have any income but has a green card. States he is not satisfied with [place]. He called to have his father seen but they did not have any available appts so he brought him to ED. Provided pt with list of [facility] Providers. Explained he will need to continue with [facility] until he has established with new PCP.   Expected Discharge Date:              Expected Discharge Plan:  Home/Self Care  In-House Referral:  NA  Discharge planning Services  CM Consult  Post Acute Care Choice:  NA Choice offered to:  NA  DME Arranged:  N/A DME Agency:  NA  HH Arranged:  NA HH Agency:  NA  Status of Service:  Completed, signed off  If discussed at [place] of Stay Meetings, dates discussed:    Additional Comments:  Erenest Rasher, RN 11/17/2017, 1:17 PM

## 2017-11-17 NOTE — Progress Notes (Signed)
Patient ID: William [name], male   DOB: 03-23-1951, 67 y.o.   MRN: [id number] [facility] Progress Note   Assessment/ Plan:   1. 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  PLEX 5 out of 8 scheduled for 4/15  Labs continue to show slow/meaningful renal recovery. S/p kidney biopsy by IR tomorrow.  With current WBC count, will start cyclophosphamide 100 mg daily (ideally 2 mg/kilogram recommended however at his age and with his leukopenia, using a lower dose).  Watch closely.  If stable tomorrow, could potentially d/c tomorrow with completion of PLEX as OP and close f/u with Dr. Posey Pronto. 2. Pancytopenia- seen by Hematology/Oncology with negative BM biopsy (4% plasma cells).  Total WBC yesterday and today around 3500- will start cyclophosphamide. 3. Cirrhosis- unclear etiology, coincidentally seen on CT scan which further might be the reason for his thrombocytopenia with concomitant hypersplenism noted. Hepatitis testing was negative. 4. Anemia of CKD stage 4-without overt loss, continue ESA while in hospital. 5. Metabolic acidosis : Started on oral sodium bicarbonate, continue to monitor levels  6. HTN- stable 7. DM per primary  Subjective:    S/p renal biopsy this AM.  Tolerated well.  No back pain, still a little sleepy from sedation.  For plasma exchange today.   Objective:   BP (!) 157/78 (BP Location: Left Arm)   Pulse 74   Temp 97.8 F (36.6 C) (Oral)   Resp 17   Ht 5' 5"  (1.651 m)   Wt 69 kg (152 lb 1.9 oz)   SpO2 98%   BMI 25.31 kg/m   Intake/Output Summary (Last 24 hours) at 11/17/2017 1126 Last data filed at 11/17/2017 0600 Gross per 24 hour  Intake 243 ml  Output 1850 ml  Net -1607 ml   Weight change: -0.3 kg (-10.6  oz)  Physical Exam: Gen: Comfortable sleeping in bed CVS: Pulse regular rhythm, normal rate, S1 and S2 normal Resp: fine bibasilar crackles Abd: Soft, mild distention, nontender, normal bowel sounds Ext: No lower extremity edema  Imaging: No results found.  Labs: BMET Recent Labs  Lab 11/11/17 0719 11/12/17 0258  11/13/17 0405 11/13/17 5397 11/14/17 0306 11/14/17 0856 11/15/17 0258 11/15/17 1349 11/16/17 0158 11/17/17 0301  NA 144 139   < > 140  --  141 143 142 145 141 141  K 4.9 5.6*   < > 5.0  --  4.1 4.1 4.1 3.7 4.0 3.9  CL 115* 109   < > 105  --  104 100* 109 101 110 107  CO2 14* 17*  --  23  --  24  --  21*  --  21* 22  GLUCOSE 180* 458*   < > 418* 490* 135* 206* 155* 257* 237* 153*  BUN 107* 123*   < > 61*  --  82* 71* 82* 68* 78* 81*  CREATININE 4.88* 5.12*   < > 3.28*  --  3.77* 3.80* 3.54* 3.60* 3.34* 3.33*  CALCIUM 7.5* 7.7*  --  7.8*  --  8.0*  --  7.6*  --  7.5* 7.9*  PHOS 5.4* 5.9*  --  4.8*  --  4.8*  --  4.5  --  4.4 4.4   < > = values in this interval not displayed.   CBC Recent Labs  Lab 11/12/17  1093  11/13/17 0405 11/14/17 0306  11/15/17 0754 11/15/17 1349 11/16/17 0158 11/17/17 0301  WBC 2.6*  --  2.8* 3.2*  --  3.5*  --  3.4* 3.4*  NEUTROABS 2.2  --  2.4 2.6  --   --   --   --  2.7  HGB 6.7*   < > 9.6* 9.2*   < > 9.9* 10.5* 10.0* 9.8*  HCT 21.2*   < > 28.6* 27.5*   < > 30.1* 31.0* 29.8* 29.7*  MCV 72.9*  --  75.5* 76.2*  --  75.3*  --  76.0* 75.8*  PLT 51*  --  59* 55*  --  67*  --  53* 64*   < > = values in this interval not displayed.    Medications:    . amLODipine  10 mg Oral Daily  . carvedilol  12.5 mg Oral BID WC  . ciprofloxacin-dexamethasone  4 drop Right EAR BID  . cyclophosphamide  100 mg Oral Daily  . [START ON 11/18/2017] Darbepoetin Alfa  25 mcg Subcutaneous Q Tue-HD  . fentaNYL      . ferrous gluconate  324 mg Oral Daily  . furosemide  40 mg Intravenous Daily  . insulin aspart  0-9 Units Subcutaneous TID WC  .  insulin aspart protamine- aspart  20 Units Subcutaneous Q supper  . insulin aspart protamine- aspart  20 Units Subcutaneous Q breakfast  . lidocaine-EPINEPHrine      . midazolam      . multivitamin with minerals  1 tablet Oral Daily  . pantoprazole  40 mg Oral Daily  . predniSONE  60 mg Oral Q breakfast  . sodium bicarbonate  1,300 mg Oral BID  . sodium chloride flush  3 mL Intravenous Q12H   Madelon Lips, MD [phone] 11/17/2017, 11:26 AM

## 2017-11-17 NOTE — Progress Notes (Addendum)
PROGRESS NOTE    William [name]  VFI:[id number] DOB: 1951-03-21 DOA: 11/06/2017 PCP: Gildardo Pounds, NP   Brief Narrative: William [name] is a 67 y.o. man PMHchronic kidney disease stage IV, cirrhosis, pancytopenia, diabetes mellitus, diastolic heart failure, presented with chronic cough, mild shortness of breath, edema. Has had extensive workup by pulmonology in the outpatient setting, admitted for acute on chronic hypoxic respiratory failure, acute on chronic diastolic heart failure, acute kidney injury superimposed on CKD stage IV. Concern for Goodpasture's disease    Assessment & Plan:   Principal Problem:   AKI (acute kidney injury) ([place]) Active Problems:   Type II diabetes mellitus with renal manifestations ([place])   Acute on chronic respiratory failure with hypoxia (H[facility])   Acute on chronic diastolic CHF (congestive heart failure) (HCC)   Pancytopenia (HCC)   Acute renal failure superimposed on stage 4 chronic kidney disease ([place])   Acute hypoxemic respiratory failure (HCC)   Hemoptysis   1-Acute Renal failure; on chronic kidney diseases stage III;  Vs rapidly progressive glomerulonephritis. 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  Received  HD 4-10. He has not received further HD.  S/P Renal Biopsy  4-15 Nephrology will started cyclophosphamide.  Hopefully discharge tomorrow.   2-Hemoptysis; . Resolved.   3-Pancytopenia; Discussed with Dr. Julien Nordmann, this is probably reactive. Treat support care, transfusion as needed.  For anemia, he is on aranesp.  He received 3 units PRBC during HD 4-10 WBC improved 3.5,,. normal ANC.  LFT increase bilirubin. LDH normal, peripheral smear pancytopenia. Haptoglobin NL Stable.   4-Cirrhosis; seen on CT scan. Might explain thrombocytopenia.  Hepatitis panel negative one moth ago.   5-Metabolic acidosis; on oral bicarb tablet.   6-DM; type 2; 70/30;;increase NPH night coverage to 20 units and  morning to 20 units.  SSI.  Will decrease NPH to avoid hypoglycemia.   7-HTN; on norvasc.   8-pulmonary edema, fluid overload due to AKI on CKD.   DVT prophylaxis: scd Code Status: full code.  Family Communication: son at bedside.  Disposition Plan; home hopefully in 24 hours.  Consultants:   Nephrology  CCM.   Oncology    Procedures:   Plasmapheresis.    Antimicrobials: none   Subjective: He is getting better, cough improved.   Objective: Vitals:   11/17/17 0835 11/17/17 0842 11/17/17 0845 11/17/17 0940  BP: (!) 186/100  (!) 178/85 (!) 157/78  Pulse: 88 84 83 74  Resp: 18 16 19 17   Temp:    97.8 F (36.6 C)  TempSrc:    Oral  SpO2: 96% 95% 96% 98%  Weight:      Height:        Intake/Output Summary (Last 24 hours) at 11/17/2017 1420 Last data filed at 11/17/2017 1200 Gross per 24 hour  Intake 243 ml  Output 1000 ml  Net -757 ml   Filed Weights   11/14/17 0500 11/16/17 0600 11/17/17 0450  Weight: 63 kg (138 lb 14.2 oz) 69.3 kg (152 lb 12.5 oz) 69 kg (152 lb 1.9 oz)    Examination:  General exam: NAD Respiratory system: Normal respiratory effort, No wheezing.  Cardiovascular system: S 1, S 2 RRR Gastrointestinal system: BS present, soft, nt Central nervous system: Non focal.  Extremities: Symmetric power.  Skin: no rash     Data Reviewed: I have personally reviewed following labs and imaging studies  CBC: Recent Labs  Lab 11/12/17 0258  11/13/17 0405 11/14/17 0306 11/14/17 0856 11/15/17 0754 11/15/17  1349 11/16/17 0158 11/17/17 0301  WBC 2.6*  --  2.8* 3.2*  --  3.5*  --  3.4* 3.4*  NEUTROABS 2.2  --  2.4 2.6  --   --   --   --  2.7  HGB 6.7*   < > 9.6* 9.2* 9.9* 9.9* 10.5* 10.0* 9.8*  HCT 21.2*   < > 28.6* 27.5* 29.0* 30.1* 31.0* 29.8* 29.7*  MCV 72.9*  --  75.5* 76.2*  --  75.3*  --  76.0* 75.8*  PLT 51*  --  59* 55*  --  67*  --  53* 64*   < > = values in this interval not displayed.   Basic Metabolic Panel: Recent Labs  Lab  11/13/17 0405  11/14/17 0306 11/14/17 0856 11/15/17 0258 11/15/17 1349 11/16/17 0158 11/17/17 0301  NA 140  --  141 143 142 145 141 141  K 5.0  --  4.1 4.1 4.1 3.7 4.0 3.9  CL 105  --  104 100* 109 101 110 107  CO2 23  --  24  --  21*  --  21* 22  GLUCOSE 418*   < > 135* 206* 155* 257* 237* 153*  BUN 61*  --  82* 71* 82* 68* 78* 81*  CREATININE 3.28*  --  3.77* 3.80* 3.54* 3.60* 3.34* 3.33*  CALCIUM 7.8*  --  8.0*  --  7.6*  --  7.5* 7.9*  PHOS 4.8*  --  4.8*  --  4.5  --  4.4 4.4   < > = values in this interval not displayed.   GFR: Estimated Creatinine Clearance: 19 mL/min (A) (by C-G formula based on SCr of 3.33 mg/dL (H)). Liver Function Tests: Recent Labs  Lab 11/13/17 0405 11/14/17 0306 11/15/17 0258 11/16/17 0158 11/17/17 0301  AST 21  --   --   --   --   ALT 6*  --   --   --   --   ALKPHOS 32*  --   --   --   --   BILITOT 1.6*  --   --   --   --   PROT 6.0*  --   --   --   --   ALBUMIN 4.9  4.9 4.6 4.4 4.5 4.2   No results for input(s): LIPASE, AMYLASE in the last 168 hours. No results for input(s): AMMONIA in the last 168 hours. Coagulation Profile: Recent Labs  Lab 11/13/17 0807 11/14/17 0306 11/17/17 0301  INR 1.77 1.62 1.75   Cardiac Enzymes: No results for input(s): CKTOTAL, CKMB, CKMBINDEX, TROPONINI in the last 168 hours. BNP (last 3 results) Recent Labs    09/25/17 1614  PROBNP 380.0*   HbA1C: No results for input(s): HGBA1C in the last 72 hours. CBG: Recent Labs  Lab 11/16/17 1707 11/16/17 2120 11/17/17 1019 11/17/17 1059 11/17/17 1133  GLUCAP 226* 135* 45* 111* 183*   Lipid Profile: No results for input(s): CHOL, HDL, LDLCALC, TRIG, CHOLHDL, LDLDIRECT in the last 72 hours. Thyroid Function Tests: No results for input(s): TSH, T4TOTAL, FREET4, T3FREE, THYROIDAB in the last 72 hours. Anemia Panel: No results for input(s): VITAMINB12, FOLATE, FERRITIN, TIBC, IRON, RETICCTPCT in the last 72 hours. Sepsis Labs: No results for  input(s): PROCALCITON, LATICACIDVEN in the last 168 hours.  No results found for this or any previous visit (from the past 240 hour(s)).       Radiology Studies: No results found.      Scheduled Meds: .  amLODipine  10 mg Oral Daily  . carvedilol  12.5 mg Oral BID WC  . ciprofloxacin-dexamethasone  4 drop Right EAR BID  . cyclophosphamide  100 mg Oral Daily  . [START ON 11/18/2017] Darbepoetin Alfa  25 mcg Subcutaneous Q Tue-HD  . fentaNYL      . ferrous gluconate  324 mg Oral Daily  . furosemide  40 mg Intravenous Daily  . insulin aspart  0-9 Units Subcutaneous TID WC  . insulin aspart protamine- aspart  18 Units Subcutaneous Q supper  . [START ON 11/18/2017] insulin aspart protamine- aspart  18 Units Subcutaneous Q breakfast  . lidocaine-EPINEPHrine      . midazolam      . multivitamin with minerals  1 tablet Oral Daily  . pantoprazole  40 mg Oral Daily  . predniSONE  60 mg Oral Q breakfast  . sodium bicarbonate  1,300 mg Oral BID  . sodium chloride flush  3 mL Intravenous Q12H   Continuous Infusions: . sodium chloride 250 mL (11/10/17 1555)  . sodium chloride    . sodium chloride    . sodium chloride       LOS: 10 days    Time spent: 35 minutes.     Elmarie Shiley, MD [facility] Pager [phone]  If 7PM-7AM, please contact night-coverage [email or web address] Password [email or web address] 11/17/2017, 2:20 PM

## 2017-11-17 NOTE — Procedures (Signed)
Pre Procedure Dx: Renal insuffieciency Post Procedural Dx: Same  Technically successful US guided biopsy of inferior pole of the right kidney.  EBL: None  No immediate complications.   Ronny Bacon, MD Pager #: [phone]

## 2017-11-18 DIAGNOSIS — Z23 Encounter for immunization: Secondary | ICD-10-CM | POA: Diagnosis not present

## 2017-11-18 DIAGNOSIS — D61818 Other pancytopenia: Secondary | ICD-10-CM | POA: Diagnosis not present

## 2017-11-18 DIAGNOSIS — E11319 Type 2 diabetes mellitus with unspecified diabetic retinopathy without macular edema: Secondary | ICD-10-CM | POA: Diagnosis not present

## 2017-11-18 DIAGNOSIS — J9621 Acute and chronic respiratory failure with hypoxia: Secondary | ICD-10-CM | POA: Diagnosis not present

## 2017-11-18 DIAGNOSIS — I129 Hypertensive chronic kidney disease with stage 1 through stage 4 chronic kidney disease, or unspecified chronic kidney disease: Secondary | ICD-10-CM | POA: Diagnosis not present

## 2017-11-18 DIAGNOSIS — B659 Schistosomiasis, unspecified: Secondary | ICD-10-CM | POA: Diagnosis not present

## 2017-11-18 DIAGNOSIS — R042 Hemoptysis: Secondary | ICD-10-CM | POA: Diagnosis not present

## 2017-11-18 DIAGNOSIS — E875 Hyperkalemia: Secondary | ICD-10-CM | POA: Diagnosis not present

## 2017-11-18 DIAGNOSIS — Z823 Family history of stroke: Secondary | ICD-10-CM | POA: Diagnosis not present

## 2017-11-18 DIAGNOSIS — R05 Cough: Secondary | ICD-10-CM | POA: Diagnosis not present

## 2017-11-18 DIAGNOSIS — N184 Chronic kidney disease, stage 4 (severe): Secondary | ICD-10-CM | POA: Diagnosis not present

## 2017-11-18 DIAGNOSIS — D631 Anemia in chronic kidney disease: Secondary | ICD-10-CM | POA: Diagnosis not present

## 2017-11-18 DIAGNOSIS — M310 Hypersensitivity angiitis: Secondary | ICD-10-CM | POA: Diagnosis not present

## 2017-11-18 DIAGNOSIS — K7460 Unspecified cirrhosis of liver: Secondary | ICD-10-CM | POA: Diagnosis not present

## 2017-11-18 DIAGNOSIS — Z794 Long term (current) use of insulin: Secondary | ICD-10-CM | POA: Diagnosis not present

## 2017-11-18 DIAGNOSIS — I5033 Acute on chronic diastolic (congestive) heart failure: Secondary | ICD-10-CM | POA: Diagnosis not present

## 2017-11-18 DIAGNOSIS — E872 Acidosis: Secondary | ICD-10-CM | POA: Diagnosis not present

## 2017-11-18 DIAGNOSIS — H6090 Unspecified otitis externa, unspecified ear: Secondary | ICD-10-CM | POA: Diagnosis not present

## 2017-11-18 DIAGNOSIS — N179 Acute kidney failure, unspecified: Secondary | ICD-10-CM | POA: Diagnosis not present

## 2017-11-18 DIAGNOSIS — I130 Hypertensive heart and chronic kidney disease with heart failure and stage 1 through stage 4 chronic kidney disease, or unspecified chronic kidney disease: Secondary | ICD-10-CM | POA: Diagnosis not present

## 2017-11-18 DIAGNOSIS — E1122 Type 2 diabetes mellitus with diabetic chronic kidney disease: Secondary | ICD-10-CM | POA: Diagnosis not present

## 2017-11-18 DIAGNOSIS — Z833 Family history of diabetes mellitus: Secondary | ICD-10-CM | POA: Diagnosis not present

## 2017-11-18 DIAGNOSIS — K219 Gastro-esophageal reflux disease without esophagitis: Secondary | ICD-10-CM | POA: Diagnosis not present

## 2017-11-18 LAB — CBC WITH DIFFERENTIAL/PLATELET
BASOS ABS: 0 10*3/uL (ref 0.0–0.1)
BASOS PCT: 0 %
Eosinophils Absolute: 0 10*3/uL (ref 0.0–0.7)
Eosinophils Relative: 0 %
HCT: 27.7 % — ABNORMAL LOW (ref 39.0–52.0)
HEMOGLOBIN: 9 g/dL — AB (ref 13.0–17.0)
LYMPHS PCT: 10 %
Lymphs Abs: 0.3 10*3/uL — ABNORMAL LOW (ref 0.7–4.0)
MCH: 24.6 pg — AB (ref 26.0–34.0)
MCHC: 32.5 g/dL (ref 30.0–36.0)
MCV: 75.7 fL — ABNORMAL LOW (ref 78.0–100.0)
Monocytes Absolute: 0.3 10*3/uL (ref 0.1–1.0)
Monocytes Relative: 9 %
NEUTROS ABS: 2.6 10*3/uL (ref 1.7–7.7)
NEUTROS PCT: 81 %
PLATELETS: 63 10*3/uL — AB (ref 150–400)
RBC: 3.66 MIL/uL — ABNORMAL LOW (ref 4.22–5.81)
RDW: 16.4 % — ABNORMAL HIGH (ref 11.5–15.5)
WBC: 3.2 10*3/uL — AB (ref 4.0–10.5)

## 2017-11-18 LAB — BPAM FFP
BLOOD PRODUCT EXPIRATION DATE: 201904192359
Blood Product Expiration Date: 201904172359
Blood Product Expiration Date: 201904202359
ISSUE DATE / TIME: 201904151519
ISSUE DATE / TIME: 201904151519
ISSUE DATE / TIME: 201904141925
UNIT TYPE AND RH: 6200
Unit Type and Rh: 6200
Unit Type and Rh: 6200

## 2017-11-18 LAB — PREPARE FRESH FROZEN PLASMA
UNIT DIVISION: 0
Unit division: 0
Unit division: 0

## 2017-11-18 LAB — GLUCOSE, CAPILLARY
Glucose-Capillary: 125 mg/dL — ABNORMAL HIGH (ref 65–99)
Glucose-Capillary: 178 mg/dL — ABNORMAL HIGH (ref 65–99)

## 2017-11-18 LAB — RENAL FUNCTION PANEL
Albumin: 4 g/dL (ref 3.5–5.0)
Anion gap: 9 (ref 5–15)
BUN: 73 mg/dL — ABNORMAL HIGH (ref 6–20)
CHLORIDE: 111 mmol/L (ref 101–111)
CO2: 22 mmol/L (ref 22–32)
CREATININE: 3.38 mg/dL — AB (ref 0.61–1.24)
Calcium: 7.5 mg/dL — ABNORMAL LOW (ref 8.9–10.3)
GFR calc Af Amer: 20 mL/min — ABNORMAL LOW (ref >60)
GFR calc non Af Amer: 18 mL/min — ABNORMAL LOW (ref >60)
GLUCOSE: 136 mg/dL — AB (ref 65–99)
Phosphorus: 4.9 mg/dL — ABNORMAL HIGH (ref 2.5–4.6)
Potassium: 3.9 mmol/L (ref 3.5–5.1)
SODIUM: 142 mmol/L (ref 135–145)

## 2017-11-18 LAB — POCT I-STAT, CHEM 8
BUN: 62 mg/dL — AB (ref 6–20)
CHLORIDE: 100 mmol/L — AB (ref 101–111)
CREATININE: 3.5 mg/dL — AB (ref 0.61–1.24)
Calcium, Ion: 0.41 mmol/L — CL (ref 1.15–1.40)
Glucose, Bld: 224 mg/dL — ABNORMAL HIGH (ref 65–99)
HEMATOCRIT: 31 % — AB (ref 39.0–52.0)
Hemoglobin: 10.5 g/dL — ABNORMAL LOW (ref 13.0–17.0)
Potassium: 4 mmol/L (ref 3.5–5.1)
Sodium: 146 mmol/L — ABNORMAL HIGH (ref 135–145)
TCO2: 22 mmol/L (ref 22–32)

## 2017-11-18 MED ORDER — FUROSEMIDE 40 MG PO TABS: 40.0000 mg | ORAL_TABLET | Freq: Every day | ORAL | 11 refills | Status: DC

## 2017-11-18 MED ORDER — FUROSEMIDE 40 MG PO TABS: 40.0000 mg | ORAL_TABLET | Freq: Two times a day (BID) | ORAL | 0 refills | Status: DC

## 2017-11-18 MED ORDER — CYCLOPHOSPHAMIDE 25 MG PO CAPS: 75.0000 mg | ORAL_CAPSULE | Freq: Every day | ORAL | 0 refills | Status: DC

## 2017-11-18 MED ORDER — INSULIN NPH ISOPHANE & REGULAR (70-30) 100 UNIT/ML ~~LOC~~ SUSP: 18.0000 [IU] | Freq: Two times a day (BID) | SUBCUTANEOUS | 1 refills | Status: DC

## 2017-11-18 MED ORDER — SODIUM BICARBONATE 650 MG PO TABS: 1300.0000 mg | ORAL_TABLET | Freq: Two times a day (BID) | ORAL | 0 refills | Status: DC

## 2017-11-18 MED ORDER — DM-GUAIFENESIN ER 30-600 MG PO TB12: 1.0000 | ORAL_TABLET | Freq: Two times a day (BID) | ORAL | 0 refills | Status: DC | PRN

## 2017-11-18 MED ORDER — PREDNISONE 20 MG PO TABS: 60.0000 mg | ORAL_TABLET | Freq: Every day | ORAL | 0 refills | Status: DC

## 2017-11-18 MED ORDER — TBO-FILGRASTIM 300 MCG/0.5ML ~~LOC~~ SOSY
300.0000 ug | PREFILLED_SYRINGE | Freq: Once | SUBCUTANEOUS | Status: AC
  Administered 2017-11-18: 300 ug via SUBCUTANEOUS
  Filled 2017-11-18: qty 0.5

## 2017-11-18 MED ORDER — CYCLOPHOSPHAMIDE 50 MG PO CAPS: 100.0000 mg | ORAL_CAPSULE | Freq: Every day | ORAL | 0 refills | Status: DC

## 2017-11-18 NOTE — Progress Notes (Signed)
I have assessed the patient throughout the shift, and agree with [facility] charting.

## 2017-11-18 NOTE — Discharge Instructions (Signed)
You have been schedule for plasmapheresis on Wednesday 4/17, Friday 4/19 and Monday 4/22.  You  will need to arrive at registration at 7:30 am those days

## 2017-11-18 NOTE — Progress Notes (Addendum)
Patient ID: William [name], male   DOB: 28-Dec-1950, 67 y.o.   MRN: [id number] [facility] Progress Note   Assessment/ Plan:   1. 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  PLEX 5 out of 8.  4/15  Labs continue to show slow/meaningful renal recovery.  His hemoglobin is stable s/p biopsy.  I am OK with him being discharged today to complete OP PLEX.  He will be scheduled for Wednesday 4/17, Friday 4/19 and Monday 4/22.  He will need to arrive at registration at 7:30 am those days.  He will continue prednisone 60 mg daily and a REDUCED dose of cytoxan 75 mg daily.  I am giving him a dose of neupogen before he is discharged today.  He will have labs 4/22 and will see Dr. Posey Pronto in close follow up which I am arranging; he has an appointment 4/29 at 1:15 pm with Dr. Posey Pronto at [facility]. 2. Pancytopenia- seen by Hematology/Oncology with negative BM biopsy (4% plasma cells).  Neupogen as above. 3. Cirrhosis- unclear etiology, coincidentally seen on CT scan which further might be the reason for his thrombocytopenia with concomitant hypersplenism noted. Hepatitis testing was negative. 4. Anemia of CKD stage 4-without overt loss, continue ESA while in hospital. 5. Metabolic acidosis : Started on oral sodium bicarbonate, continue to monitor levels  6. HTN- stable 7. DM per primary  Subjective:    Had PLEX #5 yesterday, tolerated well.  Additionally he tolerated bx well, results pending   Objective:   BP (!) 175/75 (BP Location: Left Arm)   Pulse 84   Temp 97.8 F (36.6 C) (Axillary)   Resp 16   Ht 5' 5"  (1.651 m)   Wt 68.7 kg (151 lb 7.3 oz)   SpO2 100%   BMI 25.20 kg/m   Intake/Output Summary (Last 24 hours) at 11/18/2017 1043 Last data filed at 11/18/2017  0654 Gross per 24 hour  Intake 240 ml  Output 1800 ml  Net -1560 ml   Weight change: -1 kg (-2 lb 3.3 oz)  Physical Exam: Gen: Comfortable sleeping in bed CVS: Pulse regular rhythm, normal rate, S1 and S2 normal Resp: crackles improved Abd: Soft, mild distention, nontender, normal bowel sounds Ext: No lower extremity edema  Imaging: US Biopsy (kidney)  Result Date: 11/17/2017 INDICATION: Acute worsening kidney function with clinical concern for Goodpasture's disease. Please perform ultrasound-guided random renal biopsy for tissue diagnostic purposes. EXAM: ULTRASOUND GUIDED RENAL BIOPSY COMPARISON:  Abdominal ultrasound - 09/04/2017 MEDICATIONS: None. ANESTHESIA/SEDATION: Fentanyl 100 mcg IV; Versed 2 mg IV Total Moderate Sedation time: 10 minutes; The patient was continuously monitored during the procedure by the interventional radiology nurse under my direct supervision. COMPLICATIONS: None immediate. PROCEDURE: Informed written consent was obtained from the patient (via the use of a medical translator as well as translation from the patient's son) after a discussion of the risks, benefits and alternatives to treatment. The patient understands and consents the procedure. A timeout was performed prior to the initiation of the procedure. Ultrasound scanning was performed of the bilateral flanks. The inferior pole of the right kidney was selected for biopsy due to location and sonographic window. The procedure was planned. The operative site was prepped and draped in the usual sterile fashion. The overlying soft tissues were anesthetized with 1% lidocaine with epinephrine. A  45 gauge core needle biopsy device was advanced into the inferior cortex of the left kidney and 2 core biopsies were obtained under direct ultrasound guidance. Images were saved for documentation purposes. The biopsy device was removed and hemostasis was obtained with manual compression. Post procedural scanning was negative for  significant post procedural hemorrhage or additional complication. A dressing was placed. The patient tolerated the procedure well without immediate post procedural complication. IMPRESSION: Technically successful ultrasound guided right renal biopsy. Electronically Signed   By: Sandi Mariscal M.D.   On: 11/17/2017 14:27    Labs: BMET Recent Labs  Lab 11/12/17 0258  11/13/17 0405  11/14/17 0306 11/14/17 0856 11/15/17 0258 11/15/17 1349 11/16/17 0158 11/17/17 0301 11/17/17 1617 11/18/17 0219  NA 139   < > 140  --  141 143 142 145 141 141 146* 142  K 5.6*   < > 5.0  --  4.1 4.1 4.1 3.7 4.0 3.9 4.0 3.9  CL 109   < > 105  --  104 100* 109 101 110 107 100* 111  CO2 17*  --  23  --  24  --  21*  --  21* 22  --  22  GLUCOSE 458*   < > 418*   < > 135* 206* 155* 257* 237* 153* 224* 136*  BUN 123*   < > 61*  --  82* 71* 82* 68* 78* 81* 62* 73*  CREATININE 5.12*   < > 3.28*  --  3.77* 3.80* 3.54* 3.60* 3.34* 3.33* 3.50* 3.38*  CALCIUM 7.7*  --  7.8*  --  8.0*  --  7.6*  --  7.5* 7.9*  --  7.5*  PHOS 5.9*  --  4.8*  --  4.8*  --  4.5  --  4.4 4.4  --  4.9*   < > = values in this interval not displayed.   CBC Recent Labs  Lab 11/13/17 0405 11/14/17 0306  11/15/17 0754  11/16/17 0158 11/17/17 0301 11/17/17 1617 11/18/17 0219  WBC 2.8* 3.2*  --  3.5*  --  3.4* 3.4*  --  3.2*  NEUTROABS 2.4 2.6  --   --   --   --  2.7  --  2.6  HGB 9.6* 9.2*   < > 9.9*   < > 10.0* 9.8* 10.5* 9.0*  HCT 28.6* 27.5*   < > 30.1*   < > 29.8* 29.7* 31.0* 27.7*  MCV 75.5* 76.2*  --  75.3*  --  76.0* 75.8*  --  75.7*  PLT 59* 55*  --  67*  --  53* 64*  --  63*   < > = values in this interval not displayed.    Medications:    . amLODipine  10 mg Oral Daily  . carvedilol  12.5 mg Oral BID WC  . ciprofloxacin-dexamethasone  4 drop Right EAR BID  . cyclophosphamide  100 mg Oral Daily  . Darbepoetin Alfa  25 mcg Subcutaneous Q Tue-HD  . ferrous gluconate  324 mg Oral Daily  . furosemide  40 mg Intravenous  Daily  . insulin aspart  0-9 Units Subcutaneous TID WC  . insulin aspart protamine- aspart  18 Units Subcutaneous Q supper  . insulin aspart protamine- aspart  18 Units Subcutaneous Q breakfast  . multivitamin with minerals  1 tablet Oral Daily  . pantoprazole  40 mg Oral Daily  . predniSONE  60 mg Oral Q breakfast  . sodium bicarbonate  1,300 mg  Oral BID  . sodium chloride flush  3 mL Intravenous Q12H  . Tbo-Filgrastim  300 mcg Subcutaneous Once   Madelon Lips, MD [phone] 11/18/2017, 10:43 AM

## 2017-11-18 NOTE — Progress Notes (Signed)
Per Dr Hollie Salk, patient has a tunneled dialysis catheter and may be discharged with the catheter in place.

## 2017-11-18 NOTE — Discharge Summary (Signed)
Physician Discharge Summary  William [name] HLK:[id number] DOB: 1950-09-25 DOA: 11/06/2017  PCP: Gildardo Pounds, NP  Admit date: 11/06/2017 Discharge date: 11/18/2017  Admitted From:Home Disposition:  Home   Recommendations for Outpatient Follow-up:  1. Follow up with PCP in 1-2 weeks 2. Please obtain BMP/CBC in one week 3. Needs to follow up with nephrologist for further care renal failure.  4. Follow results of biopsy     Discharge Condition: Stable.  CODE STATUS: full code.  Diet recommendation: Heart Healthy   Brief/Interim Summary: William Ahmedis a 67 y.o.man PMHchronic kidney disease stage IV, cirrhosis, pancytopenia, diabetes mellitus, diastolic heart failure, presented with chronic cough, mild shortness of breath, edema. Has had extensive workup by pulmonology in the outpatient setting, admitted for acute on chronic hypoxic respiratory failure, acute on chronic diastolic heart failure, acute kidney injury superimposed on CKD stage IV. 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  Received  HD 4-10. He has not received further HD.  S/P Renal Biopsy  4-15 Nephrology will started cyclophosphamide. Plan to discharge on 75 mg daily.  Renal function stable. Patient will complete plasmapheresis out patient.   2-Hemoptysis; . Resolved.   3-Pancytopenia; Discussed  with Dr. Julien Nordmann, this is probably reactive. Treat support care, transfusion as needed.  For anemia, he is on aranesp.  He received 3 units PRBC during HD 4-10 WBC improved 3.5,,. normal ANC.  LFT increase bilirubin. LDH normal, peripheral smear pancytopenia. Haptoglobin NL Stable.  he will get a dose of granix today   4-Cirrhosis; seen on CT scan. Might explain thrombocytopenia.  Hepatitis panel negative one moth ago.   5-Metabolic acidosis; on oral bicarb tablet.   6-DM; type 2; 70/30;;increase NPH night coverage to 20 units and morning to 20 units.  SSI.  Will decrease NPH to avoid hypoglycemia.  Discharge on 18 units BID  7-HTN; on norvasc.   8-Pulmonary edema, fluid overload due to AKI on CKD.  discharge on lasix 40 mg BID.   Discharge Diagnoses:  Principal Problem:   AKI (acute kidney injury) ([place]) Active Problems:   Type II diabetes mellitus with renal manifestations ([place])   Acute on chronic respiratory failure with hypoxia (HCC)   Acute on chronic diastolic CHF (congestive heart failure) (HCC)   Pancytopenia (HCC)   Acute renal failure superimposed on stage 4 chronic kidney disease ([place])   Acute hypoxemic respiratory failure ([place])   Hemoptysis    Discharge Instructions  Discharge Instructions    Diet - low sodium heart healthy   Complete by:  As directed    Diet Carb Modified   Complete by:  As directed    Increase activity slowly   Complete by:  As directed    Increase activity slowly   Complete by:  As directed      Allergies as of 11/18/2017      Reactions   Heparin    Patient is Muslim  and is not permitted Pork derative due to religious belief)      Medication List    STOP taking these medications   amoxicillin 875 MG tablet Commonly known as:  AMOXIL   neomycin-polymyxin-hydrocortisone 3.5-10000-1 OTIC suspension Commonly known as:  CORTISPORIN     TAKE these medications   amLODipine 10 MG tablet Commonly known as:  NORVASC Take 1  tablet (10 mg total) daily by mouth.   carvedilol 12.5 MG tablet Commonly known as:  COREG Take 12.5 mg by mouth 2 (two) times daily with a meal.   cyclophosphamide 25 MG capsule Commonly known as:  CYTOXAN Take 3 capsules (75 mg total) by mouth daily. Give on an empty stomach 1 hour before or 2 hours after meals.   dextromethorphan-guaiFENesin 30-600 MG 12hr tablet Commonly known as:  MUCINEX DM Take 1 tablet by mouth 2 (two) times daily as needed for cough.   furosemide 40 MG tablet Commonly known as:  LASIX Take 1 tablet (40 mg total) by mouth 2 (two) times daily. What changed:    medication strength  when to take this   insulin NPH-regular Human (70-30) 100 UNIT/ML injection Commonly known as:  NOVOLIN 70/30 Inject 18 Units into the skin 2 (two) times daily with a meal. What changed:    how much to take  when to take this  additional instructions   IRON 27 240 (27 FE) MG tablet Generic drug:  ferrous gluconate Take 1 tablet by mouth daily.   multivitamin with minerals tablet Take 1 tablet by mouth daily.   pantoprazole 40 MG tablet Commonly known as:  PROTONIX Take 1 tablet (40 mg total) by mouth daily. Take 30-60 min before first meal of the day   predniSONE 20 MG tablet Commonly known as:  DELTASONE Take 3 tablets (60 mg total) by mouth daily with breakfast.   sodium bicarbonate 650 MG tablet Take 2 tablets (1,300 mg total) by mouth 2 (two) times daily.      Follow-up Information    Gildardo Pounds, NP Follow up.   Specialty:  Nurse Practitioner Contact information: [place] [address] [phone]        Brand Males, MD. Go on 12/10/2017.   Specialty:  Pulmonary Disease Why:  at 14.  Call sooner if needed or to reschedule.  Contact information: [place] [address] [phone]        [facility] Follow up.   Why:  please call number to assist with finding a new Primary Care Physician that  accepts your insurance Contact information: [phone]       Elmarie Shiley, MD Follow up in 1 week(s).   Specialty:  Nephrology Contact information: [place] [address] [phone]          Allergies  Allergen Reactions  . Heparin     Patient is Muslim and is not permitted Pork derative due to religious belief)    Consultations: Nephrology IR  Procedures/Studies: Dg Chest 2 View  Result Date: 11/06/2017 CLINICAL DATA:  Cough, coughing up blood EXAM: CHEST - 2 VIEW COMPARISON:  09/18/2017 FINDINGS: Diffuse bilateral interstitial and alveolar airspace opacities. No pleural effusion or pneumothorax. Stable cardiomediastinal silhouette. No aggressive osseous lesion. IMPRESSION: Bilateral interstitial and alveolar airspace opacities. Differential diagnosis includes pulmonary edema versus multi lobar pneumonia. Electronically Signed   By: Kathreen Devoid   On: 11/06/2017 18:35   Ct Chest High Resolution  Result Date: 11/10/2017 CLINICAL DATA:  Cough, ongoing hemoptysis, rapidly progressive kidney failure. Shortness of breath. EXAM: CT CHEST WITHOUT CONTRAST TECHNIQUE: Multidetector CT imaging of the chest was performed following the standard protocol without intravenous contrast. High resolution imaging of the lungs, as well as inspiratory and expiratory imaging, was performed. COMPARISON:  09/17/2017. FINDINGS: Cardiovascular: Atherosclerotic calcification of the arterial vasculature, including coronary arteries. Heart is enlarged. No pericardial effusion. Mediastinum/Nodes: Mediastinal lymph nodes are not enlarged by CT size criteria. Hilar regions are difficult to evaluate without IV contrast. No axillary adenopathy. Esophagus is grossly unremarkable. Distal periesophageal lymph nodes are not enlarged by CT size criteria. Lungs/Pleura: Patchy bilateral ground-glass with septal thickening, some of which is new in the right upper lobe. Associated mild architectural distortion,  peribronchial thickening and bronchiectasis in the lower lobes. Mild peribronchovascular consolidation in both lower lobes as well. Tiny right pleural effusion, decreased. Small left pleural effusion, also decreased. Upper Abdomen: Liver margin is irregular. Visualized portions of the liver, gallbladder, adrenal glands and right kidney are otherwise grossly unremarkable. Spleen appears enlarged but is incompletely imaged. Visualized portions of the stomach and bowel are grossly unremarkable. Upper abdominal lymph nodes are not enlarged by CT size criteria. Musculoskeletal: No worrisome lytic or sclerotic lesions. IMPRESSION: 1. Patchy bilateral ground-glass with septal thickening, some of which is new in the right upper lobe. Associated areas of new architectural distortion. Findings may be due to a combination of pulmonary hemorrhage superimposed on evolving postinflammatory fibrosis. Chronic hypersensitivity pneumonitis is not excluded. 2. Small bilateral pleural effusions, left greater than right, decreased from prior. 3. Cirrhosis. 4. Spleen appears enlarged but is incompletely imaged. 5. Aortic atherosclerosis (ICD10-170.0). Coronary artery calcification. Electronically Signed   By: Lorin Picket M.D.   On: 11/10/2017 09:57   Ir Fluoro Guide Cv Line Right  Result Date: 11/11/2017 CLINICAL DATA:  Renal failure requiring hemodialysis. EXAM: TUNNELED CENTRAL VENOUS HEMODIALYSIS CATHETER PLACEMENT WITH ULTRASOUND AND FLUOROSCOPIC GUIDANCE ANESTHESIA/SEDATION: 2.0 mg IV Versed; 100 mcg IV Fentanyl. Total Moderate Sedation Time:   20 minutes. The patient's level of consciousness and physiologic status were continuously monitored during the procedure by Radiology nursing. MEDICATIONS: 2 g IV Ancef. FLUOROSCOPY TIME:  24 seconds.  1.5 mGy. PROCEDURE: The procedure, risks, benefits, and alternatives were explained to the patient. Questions regarding the procedure were encouraged and answered. The patient  understands and consents to the procedure. A timeout was performed prior to initiating the procedure. The right neck and chest were prepped with chlorhexidine in a sterile fashion, and a sterile drape was applied covering the operative field. Maximum barrier sterile technique with sterile gowns and gloves were used for the procedure. Local anesthesia was provided with 1% lidocaine. Ultrasound was used to confirm patency of the right internal jugular vein. After creating a small venotomy incision, a 21 gauge needle was advanced into the right internal jugular vein under direct, real-time ultrasound guidance. Ultrasound image documentation was performed. After securing guidewire access, an 8 Fr dilator was placed. A J-wire was kinked to measure appropriate catheter length. A Palindrome tunneled hemodialysis catheter measuring 19 cm from tip to cuff was chosen for placement. This was tunneled in a retrograde fashion from the chest wall to the venotomy incision. At the venotomy, serial dilatation was performed and a 16 Fr peel-away sheath was placed over a guidewire. The catheter was then placed through the sheath and the sheath removed. Final catheter positioning was confirmed and documented with a fluoroscopic spot image. The catheter was aspirated, flushed with saline, and injected with  appropriate volume heparin dwells. The venotomy incision was closed with subcuticular 4-0 Vicryl. Dermabond was applied to the incision. The catheter exit site was secured with 0-Prolene retention sutures. COMPLICATIONS: None.  No pneumothorax. FINDINGS: After catheter placement, the tip lies in the right atrium. The catheter aspirates normally and is ready for immediate use. IMPRESSION: Placement of tunneled hemodialysis catheter via the right internal jugular vein. The catheter tip lies in the right atrium. The catheter is ready for immediate use. Electronically Signed   By: Aletta Edouard M.D.   On: 11/11/2017 09:58   Ir US  Guide Vasc Access Right  Result Date: 11/11/2017 CLINICAL DATA:  Renal failure requiring hemodialysis. EXAM: TUNNELED CENTRAL VENOUS HEMODIALYSIS CATHETER PLACEMENT WITH ULTRASOUND AND FLUOROSCOPIC GUIDANCE ANESTHESIA/SEDATION: 2.0 mg IV Versed; 100 mcg IV Fentanyl. Total Moderate Sedation Time:   20 minutes. The patient's level of consciousness and physiologic status were continuously monitored during the procedure by Radiology nursing. MEDICATIONS: 2 g IV Ancef. FLUOROSCOPY TIME:  24 seconds.  1.5 mGy. PROCEDURE: The procedure, risks, benefits, and alternatives were explained to the patient. Questions regarding the procedure were encouraged and answered. The patient understands and consents to the procedure. A timeout was performed prior to initiating the procedure. The right neck and chest were prepped with chlorhexidine in a sterile fashion, and a sterile drape was applied covering the operative field. Maximum barrier sterile technique with sterile gowns and gloves were used for the procedure. Local anesthesia was provided with 1% lidocaine. Ultrasound was used to confirm patency of the right internal jugular vein. After creating a small venotomy incision, a 21 gauge needle was advanced into the right internal jugular vein under direct, real-time ultrasound guidance. Ultrasound image documentation was performed. After securing guidewire access, an 8 Fr dilator was placed. A J-wire was kinked to measure appropriate catheter length. A Palindrome tunneled hemodialysis catheter measuring 19 cm from tip to cuff was chosen for placement. This was tunneled in a retrograde fashion from the chest wall to the venotomy incision. At the venotomy, serial dilatation was performed and a 16 Fr peel-away sheath was placed over a guidewire. The catheter was then placed through the sheath and the sheath removed. Final catheter positioning was confirmed and documented with a fluoroscopic spot image. The catheter was aspirated,  flushed with saline, and injected with appropriate volume heparin dwells. The venotomy incision was closed with subcuticular 4-0 Vicryl. Dermabond was applied to the incision. The catheter exit site was secured with 0-Prolene retention sutures. COMPLICATIONS: None.  No pneumothorax. FINDINGS: After catheter placement, the tip lies in the right atrium. The catheter aspirates normally and is ready for immediate use. IMPRESSION: Placement of tunneled hemodialysis catheter via the right internal jugular vein. The catheter tip lies in the right atrium. The catheter is ready for immediate use. Electronically Signed   By: Aletta Edouard M.D.   On: 11/11/2017 09:58   Dg Chest Port 1 View  Result Date: 11/11/2017 CLINICAL DATA:  Hemoptysis EXAM: PORTABLE CHEST 1 VIEW COMPARISON:  November 06, 2017 chest radiograph and chest CT November 07, 2017 FINDINGS: Central catheter tip in superior vena cava. No pneumothorax. There is widespread interstitial and patchy alveolar opacities, similar to recent studies. There is cardiomegaly with mild pulmonary venous hypertension. There is aortic atherosclerosis. No adenopathy. No bone lesions. IMPRESSION: Stable widespread interstitial and alveolar opacities as noted on recent studies. Question atypical pneumonia versus hypersensitivity pneumonitis. A degree of pulmonary hemorrhage is possible. A somewhat atypical presentation of congestive heart failure  is also possible. CT suggests a degree of underlying fibrosis. Stable changes of pulmonary vascular congestion. There is aortic atherosclerosis. Central catheter as described without pneumothorax. Aortic Atherosclerosis (ICD10-I70.0). Electronically Signed   By: Lowella Grip III M.D.   On: 11/11/2017 07:40   US Biopsy (kidney)  Result Date: 11/17/2017 INDICATION: Acute worsening kidney function with clinical concern for Goodpasture's disease. Please perform ultrasound-guided random renal biopsy for tissue diagnostic purposes. EXAM:  ULTRASOUND GUIDED RENAL BIOPSY COMPARISON:  Abdominal ultrasound - 09/04/2017 MEDICATIONS: None. ANESTHESIA/SEDATION: Fentanyl 100 mcg IV; Versed 2 mg IV Total Moderate Sedation time: 10 minutes; The patient was continuously monitored during the procedure by the interventional radiology nurse under my direct supervision. COMPLICATIONS: None immediate. PROCEDURE: Informed written consent was obtained from the patient (via the use of a medical translator as well as translation from the patient's son) after a discussion of the risks, benefits and alternatives to treatment. The patient understands and consents the procedure. A timeout was performed prior to the initiation of the procedure. Ultrasound scanning was performed of the bilateral flanks. The inferior pole of the right kidney was selected for biopsy due to location and sonographic window. The procedure was planned. The operative site was prepped and draped in the usual sterile fashion. The overlying soft tissues were anesthetized with 1% lidocaine with epinephrine. A 17 gauge core needle biopsy device was advanced into the inferior cortex of the left kidney and 2 core biopsies were obtained under direct ultrasound guidance. Images were saved for documentation purposes. The biopsy device was removed and hemostasis was obtained with manual compression. Post procedural scanning was negative for significant post procedural hemorrhage or additional complication. A dressing was placed. The patient tolerated the procedure well without immediate post procedural complication. IMPRESSION: Technically successful ultrasound guided right renal biopsy. Electronically Signed   By: Sandi Mariscal M.D.   On: 11/17/2017 14:27      Subjective: Feeling well, denies dyspnea. Cough improved.   Discharge Exam: Vitals:   11/18/17 0142 11/18/17 0800  BP: (!) 162/71 (!) 175/75  Pulse: 81 84  Resp:    Temp: 98.5 F (36.9 C) 97.8 F (36.6 C)  SpO2: 98% 100%   Vitals:    11/17/17 1856 11/18/17 0142 11/18/17 0500 11/18/17 0800  BP: (!) 165/94 (!) 162/71  (!) 175/75  Pulse: 83 81  84  Resp: 16     Temp: 98.1 F (36.7 C) 98.5 F (36.9 C)  97.8 F (36.6 C)  TempSrc: Oral Oral  Axillary  SpO2: 99% 98%  100%  Weight:   68.7 kg (151 lb 7.3 oz)   Height:        General: Pt is alert, awake, not in acute distress Cardiovascular: RRR, S1/S2 +, no rubs, no gallops Respiratory: CTA bilaterally, no wheezing, no rhonchi Abdominal: Soft, NT, ND, bowel sounds + Extremities: no edema, no cyanosis    The results of significant diagnostics from this hospitalization (including imaging, microbiology, ancillary and laboratory) are listed below for reference.     Microbiology: No results found for this or any previous visit (from the past 240 hour(s)).   Labs: BNP (last 3 results) Recent Labs    11/06/17 1713  BNP 097.3*   Basic Metabolic Panel: Recent Labs  Lab 11/14/17 0306  11/15/17 0258 11/15/17 1349 11/16/17 0158 11/17/17 0301 11/17/17 1617 11/18/17 0219  NA 141   < > 142 145 141 141 146* 142  K 4.1   < > 4.1 3.7 4.0 3.9 4.0 3.9  CL 104   < > 109 101 110 107 100* 111  CO2 24  --  21*  --  21* 22  --  22  GLUCOSE 135*   < > 155* 257* 237* 153* 224* 136*  BUN 82*   < > 82* 68* 78* 81* 62* 73*  CREATININE 3.77*   < > 3.54* 3.60* 3.34* 3.33* 3.50* 3.38*  CALCIUM 8.0*  --  7.6*  --  7.5* 7.9*  --  7.5*  PHOS 4.8*  --  4.5  --  4.4 4.4  --  4.9*   < > = values in this interval not displayed.   Liver Function Tests: Recent Labs  Lab 11/13/17 0405 11/14/17 0306 11/15/17 0258 11/16/17 0158 11/17/17 0301 11/18/17 0219  AST 21  --   --   --   --   --   ALT 6*  --   --   --   --   --   ALKPHOS 32*  --   --   --   --   --   BILITOT 1.6*  --   --   --   --   --   PROT 6.0*  --   --   --   --   --   ALBUMIN 4.9  4.9 4.6 4.4 4.5 4.2 4.0   No results for input(s): LIPASE, AMYLASE in the last 168 hours. No results for input(s): AMMONIA in the  last 168 hours. CBC: Recent Labs  Lab 11/12/17 0258  11/13/17 0405 11/14/17 0306  11/15/17 0754 11/15/17 1349 11/16/17 0158 11/17/17 0301 11/17/17 1617 11/18/17 0219  WBC 2.6*  --  2.8* 3.2*  --  3.5*  --  3.4* 3.4*  --  3.2*  NEUTROABS 2.2  --  2.4 2.6  --   --   --   --  2.7  --  2.6  HGB 6.7*   < > 9.6* 9.2*   < > 9.9* 10.5* 10.0* 9.8* 10.5* 9.0*  HCT 21.2*   < > 28.6* 27.5*   < > 30.1* 31.0* 29.8* 29.7* 31.0* 27.7*  MCV 72.9*  --  75.5* 76.2*  --  75.3*  --  76.0* 75.8*  --  75.7*  PLT 51*  --  59* 55*  --  67*  --  53* 64*  --  63*   < > = values in this interval not displayed.   Cardiac Enzymes: No results for input(s): CKTOTAL, CKMB, CKMBINDEX, TROPONINI in the last 168 hours. BNP: Invalid input(s): POCBNP CBG: Recent Labs  Lab 11/17/17 1422 11/17/17 1850 11/17/17 2047 11/18/17 0848 11/18/17 1155  GLUCAP 225* 217* 218* 125* 178*   D-Dimer No results for input(s): DDIMER in the last 72 hours. Hgb A1c No results for input(s): HGBA1C in the last 72 hours. Lipid Profile No results for input(s): CHOL, HDL, LDLCALC, TRIG, CHOLHDL, LDLDIRECT in the last 72 hours. Thyroid function studies No results for input(s): TSH, T4TOTAL, T3FREE, THYROIDAB in the last 72 hours.  Invalid input(s): FREET3 Anemia work up No results for input(s): VITAMINB12, FOLATE, FERRITIN, TIBC, IRON, RETICCTPCT in the last 72 hours. Urinalysis    Component Value Date/Time   COLORURINE YELLOW 11/10/2017 1412   APPEARANCEUR HAZY (A) 11/10/2017 1412   LABSPEC 1.014 11/10/2017 1412   PHURINE 5.0 11/10/2017 1412   GLUCOSEU 50 (A) 11/10/2017 1412   HGBUR SMALL (A) 11/10/2017 1412   BILIRUBINUR NEGATIVE 11/10/2017 1412   BILIRUBINUR neg 09/14/2015 1441  KETONESUR NEGATIVE 11/10/2017 1412   PROTEINUR >=300 (A) 11/10/2017 1412   UROBILINOGEN 0.2 09/14/2015 1441   UROBILINOGEN 0.2 01/27/2013 2052   NITRITE NEGATIVE 11/10/2017 1412   LEUKOCYTESUR NEGATIVE 11/10/2017 1412   Sepsis  Labs Invalid input(s): PROCALCITONIN,  WBC,  LACTICIDVEN Microbiology No results found for this or any previous visit (from the past 240 hour(s)).   Time coordinating discharge: 35 minutes,   SIGNED:   Elmarie Shiley, MD  [facility] 11/18/2017, 1:46 PM Pager   If 7PM-7AM, please contact night-coverage [email or web address] Password [email or web address]

## 2017-11-19 ENCOUNTER — Encounter (HOSPITAL_COMMUNITY): Payer: Self-pay | Admitting: Emergency Medicine

## 2017-11-19 ENCOUNTER — Emergency Department (HOSPITAL_COMMUNITY): Payer: Medicare Other

## 2017-11-19 ENCOUNTER — Other Ambulatory Visit: Payer: Self-pay

## 2017-11-19 ENCOUNTER — Non-Acute Institutional Stay (HOSPITAL_COMMUNITY)
Admission: AD | Admit: 2017-11-19 | Discharge: 2017-11-19 | Disposition: A | Discharge status: Home / Self Care | Payer: Medicare Other | Source: Ambulatory Visit | Attending: Nephrology | Admitting: Nephrology

## 2017-11-19 ENCOUNTER — Inpatient Hospital Stay (HOSPITAL_COMMUNITY)
Admission: EM | Admit: 2017-11-19 | Discharge: 2017-11-29 | DRG: 545 | Disposition: A | Discharge status: Home / Self Care | Payer: Medicare Other | Attending: Internal Medicine | Admitting: Internal Medicine

## 2017-11-19 DIAGNOSIS — E1122 Type 2 diabetes mellitus with diabetic chronic kidney disease: Secondary | ICD-10-CM | POA: Diagnosis not present

## 2017-11-19 DIAGNOSIS — I130 Hypertensive heart and chronic kidney disease with heart failure and stage 1 through stage 4 chronic kidney disease, or unspecified chronic kidney disease: Secondary | ICD-10-CM | POA: Diagnosis present

## 2017-11-19 DIAGNOSIS — N184 Chronic kidney disease, stage 4 (severe): Secondary | ICD-10-CM | POA: Diagnosis not present

## 2017-11-19 DIAGNOSIS — D61818 Other pancytopenia: Secondary | ICD-10-CM

## 2017-11-19 DIAGNOSIS — R042 Hemoptysis: Secondary | ICD-10-CM | POA: Insufficient documentation

## 2017-11-19 DIAGNOSIS — E1165 Type 2 diabetes mellitus with hyperglycemia: Secondary | ICD-10-CM | POA: Diagnosis not present

## 2017-11-19 DIAGNOSIS — N179 Acute kidney failure, unspecified: Secondary | ICD-10-CM | POA: Diagnosis not present

## 2017-11-19 DIAGNOSIS — N009 Acute nephritic syndrome with unspecified morphologic changes: Secondary | ICD-10-CM | POA: Diagnosis present

## 2017-11-19 DIAGNOSIS — T380X5A Adverse effect of glucocorticoids and synthetic analogues, initial encounter: Secondary | ICD-10-CM | POA: Diagnosis not present

## 2017-11-19 DIAGNOSIS — Z9889 Other specified postprocedural states: Secondary | ICD-10-CM

## 2017-11-19 DIAGNOSIS — E1129 Type 2 diabetes mellitus with other diabetic kidney complication: Secondary | ICD-10-CM | POA: Diagnosis present

## 2017-11-19 DIAGNOSIS — I10 Essential (primary) hypertension: Secondary | ICD-10-CM | POA: Diagnosis not present

## 2017-11-19 DIAGNOSIS — E872 Acidosis, unspecified: Secondary | ICD-10-CM | POA: Diagnosis present

## 2017-11-19 DIAGNOSIS — R140 Abdominal distension (gaseous): Secondary | ICD-10-CM

## 2017-11-19 DIAGNOSIS — M310 Hypersensitivity angiitis: Secondary | ICD-10-CM | POA: Insufficient documentation

## 2017-11-19 DIAGNOSIS — Z833 Family history of diabetes mellitus: Secondary | ICD-10-CM | POA: Diagnosis not present

## 2017-11-19 DIAGNOSIS — R609 Edema, unspecified: Secondary | ICD-10-CM | POA: Diagnosis present

## 2017-11-19 DIAGNOSIS — R739 Hyperglycemia, unspecified: Secondary | ICD-10-CM

## 2017-11-19 DIAGNOSIS — I129 Hypertensive chronic kidney disease with stage 1 through stage 4 chronic kidney disease, or unspecified chronic kidney disease: Secondary | ICD-10-CM | POA: Insufficient documentation

## 2017-11-19 DIAGNOSIS — K7460 Unspecified cirrhosis of liver: Secondary | ICD-10-CM | POA: Diagnosis not present

## 2017-11-19 DIAGNOSIS — E1121 Type 2 diabetes mellitus with diabetic nephropathy: Secondary | ICD-10-CM | POA: Diagnosis not present

## 2017-11-19 DIAGNOSIS — N189 Chronic kidney disease, unspecified: Secondary | ICD-10-CM

## 2017-11-19 DIAGNOSIS — Z794 Long term (current) use of insulin: Secondary | ICD-10-CM | POA: Diagnosis not present

## 2017-11-19 DIAGNOSIS — I5032 Chronic diastolic (congestive) heart failure: Secondary | ICD-10-CM | POA: Diagnosis not present

## 2017-11-19 DIAGNOSIS — R600 Localized edema: Secondary | ICD-10-CM | POA: Diagnosis present

## 2017-11-19 DIAGNOSIS — R918 Other nonspecific abnormal finding of lung field: Secondary | ICD-10-CM

## 2017-11-19 DIAGNOSIS — Z79899 Other long term (current) drug therapy: Secondary | ICD-10-CM | POA: Insufficient documentation

## 2017-11-19 HISTORY — DX: Hypersensitivity angiitis: M31.0

## 2017-11-19 HISTORY — DX: Anemia, unspecified: D64.9

## 2017-11-19 HISTORY — DX: Personal history of other medical treatment: Z92.89

## 2017-11-19 HISTORY — DX: Type 2 diabetes mellitus without complications: E11.9

## 2017-11-19 HISTORY — DX: Heart failure, unspecified: I50.9

## 2017-11-19 LAB — POCT I-STAT, CHEM 8
BUN: 70 mg/dL — ABNORMAL HIGH (ref 6–20)
CREATININE: 3.6 mg/dL — AB (ref 0.61–1.24)
Calcium, Ion: 1.07 mmol/L — ABNORMAL LOW (ref 1.15–1.40)
Chloride: 103 mmol/L (ref 101–111)
Glucose, Bld: 343 mg/dL — ABNORMAL HIGH (ref 65–99)
HEMATOCRIT: 30 % — AB (ref 39.0–52.0)
HEMOGLOBIN: 10.2 g/dL — AB (ref 13.0–17.0)
Potassium: 4.5 mmol/L (ref 3.5–5.1)
Sodium: 140 mmol/L (ref 135–145)
TCO2: 20 mmol/L — AB (ref 22–32)

## 2017-11-19 LAB — CBC
HEMATOCRIT: 27.2 % — AB (ref 39.0–52.0)
HEMOGLOBIN: 9.1 g/dL — AB (ref 13.0–17.0)
MCH: 25.6 pg — AB (ref 26.0–34.0)
MCHC: 33.5 g/dL (ref 30.0–36.0)
MCV: 76.6 fL — ABNORMAL LOW (ref 78.0–100.0)
Platelets: 47 10*3/uL — ABNORMAL LOW (ref 150–400)
RBC: 3.55 MIL/uL — ABNORMAL LOW (ref 4.22–5.81)
RDW: 17.6 % — ABNORMAL HIGH (ref 11.5–15.5)
WBC: 6.5 10*3/uL (ref 4.0–10.5)

## 2017-11-19 LAB — BASIC METABOLIC PANEL
ANION GAP: 13 (ref 5–15)
BUN: 87 mg/dL — ABNORMAL HIGH (ref 6–20)
CALCIUM: 7.3 mg/dL — AB (ref 8.9–10.3)
CO2: 17 mmol/L — AB (ref 22–32)
Chloride: 109 mmol/L (ref 101–111)
Creatinine, Ser: 3.71 mg/dL — ABNORMAL HIGH (ref 0.61–1.24)
GFR calc Af Amer: 18 mL/min — ABNORMAL LOW (ref >60)
GFR calc non Af Amer: 16 mL/min — ABNORMAL LOW (ref >60)
Glucose, Bld: 429 mg/dL — ABNORMAL HIGH (ref 65–99)
POTASSIUM: 4.3 mmol/L (ref 3.5–5.1)
Sodium: 139 mmol/L (ref 135–145)

## 2017-11-19 LAB — I-STAT TROPONIN, ED: Troponin i, poc: 0.02 ng/mL (ref 0.00–0.08)

## 2017-11-19 MED ORDER — ACETAMINOPHEN 325 MG PO TABS
650.0000 mg | ORAL_TABLET | ORAL | Status: DC | PRN
  Administered 2017-11-19: 650 mg via ORAL

## 2017-11-19 MED ORDER — CALCIUM CARBONATE ANTACID 500 MG PO CHEW
CHEWABLE_TABLET | ORAL | Status: AC
  Administered 2017-11-19: 400 mg via ORAL
  Filled 2017-11-19: qty 2

## 2017-11-19 MED ORDER — ACD FORMULA A 0.73-2.45-2.2 GM/100ML VI SOLN: 500.0000 mL | Status: DC

## 2017-11-19 MED ORDER — SODIUM CHLORIDE 0.9 % IV SOLN
INTRAVENOUS | Status: AC
  Administered 2017-11-19 (×3): via INTRAVENOUS_CENTRAL
  Filled 2017-11-19 (×3): qty 200

## 2017-11-19 MED ORDER — CALCIUM CARBONATE ANTACID 500 MG PO CHEW
2.0000 | CHEWABLE_TABLET | ORAL | Status: DC
  Administered 2017-11-19: 400 mg via ORAL

## 2017-11-19 MED ORDER — ANTICOAGULANT SODIUM CITRATE 4% (200MG/5ML) IV SOLN
5.0000 mL | Freq: Once | Status: AC
  Administered 2017-11-19: 5 mL
  Filled 2017-11-19: qty 250

## 2017-11-19 MED ORDER — DIPHENHYDRAMINE HCL 25 MG PO CAPS
ORAL_CAPSULE | ORAL | Status: AC
  Administered 2017-11-19: 25 mg via ORAL
  Filled 2017-11-19: qty 1

## 2017-11-19 MED ORDER — SODIUM CHLORIDE 0.9 % IV SOLN
2.0000 g | Freq: Once | INTRAVENOUS | Status: AC
  Administered 2017-11-19: 2 g via INTRAVENOUS
  Filled 2017-11-19: qty 20

## 2017-11-19 MED ORDER — ACETAMINOPHEN 325 MG PO TABS
ORAL_TABLET | ORAL | Status: AC
  Administered 2017-11-19: 650 mg via ORAL
  Filled 2017-11-19: qty 2

## 2017-11-19 MED ORDER — DIPHENHYDRAMINE HCL 25 MG PO CAPS
25.0000 mg | ORAL_CAPSULE | Freq: Four times a day (QID) | ORAL | Status: DC | PRN
  Administered 2017-11-19: 25 mg via ORAL

## 2017-11-19 NOTE — ED Triage Notes (Signed)
Pt presents with c/o spitting up bright red blood. Pt recently admitted for similar symptoms, family reports that pt has plasmapharesis today. Pt also c/o chest pressure that started today.

## 2017-11-20 ENCOUNTER — Observation Stay (HOSPITAL_COMMUNITY): Payer: Medicare Other

## 2017-11-20 ENCOUNTER — Other Ambulatory Visit: Payer: Self-pay

## 2017-11-20 ENCOUNTER — Encounter (HOSPITAL_COMMUNITY): Payer: Self-pay | Admitting: General Practice

## 2017-11-20 DIAGNOSIS — R042 Hemoptysis: Secondary | ICD-10-CM | POA: Diagnosis not present

## 2017-11-20 DIAGNOSIS — Z794 Long term (current) use of insulin: Secondary | ICD-10-CM | POA: Diagnosis not present

## 2017-11-20 DIAGNOSIS — R739 Hyperglycemia, unspecified: Secondary | ICD-10-CM

## 2017-11-20 DIAGNOSIS — N179 Acute kidney failure, unspecified: Secondary | ICD-10-CM | POA: Diagnosis not present

## 2017-11-20 DIAGNOSIS — M310 Hypersensitivity angiitis: Secondary | ICD-10-CM | POA: Diagnosis not present

## 2017-11-20 DIAGNOSIS — N049 Nephrotic syndrome with unspecified morphologic changes: Secondary | ICD-10-CM | POA: Diagnosis not present

## 2017-11-20 DIAGNOSIS — E1122 Type 2 diabetes mellitus with diabetic chronic kidney disease: Secondary | ICD-10-CM | POA: Diagnosis not present

## 2017-11-20 DIAGNOSIS — N184 Chronic kidney disease, stage 4 (severe): Secondary | ICD-10-CM

## 2017-11-20 DIAGNOSIS — I10 Essential (primary) hypertension: Secondary | ICD-10-CM

## 2017-11-20 DIAGNOSIS — R600 Localized edema: Secondary | ICD-10-CM | POA: Diagnosis present

## 2017-11-20 DIAGNOSIS — R609 Edema, unspecified: Secondary | ICD-10-CM

## 2017-11-20 DIAGNOSIS — D61818 Other pancytopenia: Secondary | ICD-10-CM | POA: Diagnosis not present

## 2017-11-20 DIAGNOSIS — E872 Acidosis, unspecified: Secondary | ICD-10-CM | POA: Diagnosis present

## 2017-11-20 DIAGNOSIS — I129 Hypertensive chronic kidney disease with stage 1 through stage 4 chronic kidney disease, or unspecified chronic kidney disease: Secondary | ICD-10-CM | POA: Diagnosis not present

## 2017-11-20 LAB — POCT I-STAT, CHEM 8
BUN: 72 mg/dL — ABNORMAL HIGH (ref 6–20)
CALCIUM ION: 0.71 mmol/L — AB (ref 1.15–1.40)
CHLORIDE: 104 mmol/L (ref 101–111)
CREATININE: 3.7 mg/dL — AB (ref 0.61–1.24)
GLUCOSE: 354 mg/dL — AB (ref 65–99)
HCT: 27 % — ABNORMAL LOW (ref 39.0–52.0)
HEMOGLOBIN: 9.2 g/dL — AB (ref 13.0–17.0)
POTASSIUM: 3.9 mmol/L (ref 3.5–5.1)
Sodium: 144 mmol/L (ref 135–145)
TCO2: 20 mmol/L — ABNORMAL LOW (ref 22–32)

## 2017-11-20 LAB — CBC WITH DIFFERENTIAL/PLATELET
Basophils Absolute: 0 10*3/uL (ref 0.0–0.1)
Basophils Relative: 0 %
EOS ABS: 0.1 10*3/uL (ref 0.0–0.7)
EOS PCT: 1 %
HCT: 25.9 % — ABNORMAL LOW (ref 39.0–52.0)
Hemoglobin: 8.6 g/dL — ABNORMAL LOW (ref 13.0–17.0)
LYMPHS ABS: 0.5 10*3/uL — AB (ref 0.7–4.0)
LYMPHS PCT: 7 %
MCH: 25.1 pg — AB (ref 26.0–34.0)
MCHC: 33.2 g/dL (ref 30.0–36.0)
MCV: 75.7 fL — AB (ref 78.0–100.0)
MONO ABS: 0.3 10*3/uL (ref 0.1–1.0)
Monocytes Relative: 4 %
Neutro Abs: 5.7 10*3/uL (ref 1.7–7.7)
Neutrophils Relative %: 88 %
PLATELETS: 47 10*3/uL — AB (ref 150–400)
RBC: 3.42 MIL/uL — ABNORMAL LOW (ref 4.22–5.81)
RDW: 17.7 % — AB (ref 11.5–15.5)
WBC: 6.5 10*3/uL (ref 4.0–10.5)

## 2017-11-20 LAB — COMPREHENSIVE METABOLIC PANEL
ALT: 9 U/L — ABNORMAL LOW (ref 17–63)
ANION GAP: 13 (ref 5–15)
AST: 16 U/L (ref 15–41)
Albumin: 4.4 g/dL (ref 3.5–5.0)
Alkaline Phosphatase: 38 U/L (ref 38–126)
BUN: 92 mg/dL — ABNORMAL HIGH (ref 6–20)
CHLORIDE: 109 mmol/L (ref 101–111)
CO2: 20 mmol/L — ABNORMAL LOW (ref 22–32)
Calcium: 7.8 mg/dL — ABNORMAL LOW (ref 8.9–10.3)
Creatinine, Ser: 3.81 mg/dL — ABNORMAL HIGH (ref 0.61–1.24)
GFR, EST AFRICAN AMERICAN: 18 mL/min — AB (ref >60)
GFR, EST NON AFRICAN AMERICAN: 15 mL/min — AB (ref >60)
Glucose, Bld: 364 mg/dL — ABNORMAL HIGH (ref 65–99)
POTASSIUM: 4 mmol/L (ref 3.5–5.1)
SODIUM: 142 mmol/L (ref 135–145)
Total Bilirubin: 2.7 mg/dL — ABNORMAL HIGH (ref 0.3–1.2)
Total Protein: 5.1 g/dL — ABNORMAL LOW (ref 6.5–8.1)

## 2017-11-20 LAB — TYPE AND SCREEN
ABO/RH(D): O POS
Antibody Screen: NEGATIVE

## 2017-11-20 LAB — CBG MONITORING, ED: Glucose-Capillary: 366 mg/dL — ABNORMAL HIGH (ref 65–99)

## 2017-11-20 LAB — GLUCOSE, CAPILLARY
GLUCOSE-CAPILLARY: 363 mg/dL — AB (ref 65–99)
GLUCOSE-CAPILLARY: 375 mg/dL — AB (ref 65–99)

## 2017-11-20 LAB — I-STAT TROPONIN, ED: TROPONIN I, POC: 0.02 ng/mL (ref 0.00–0.08)

## 2017-11-20 MED ORDER — ACD FORMULA A 0.73-2.45-2.2 GM/100ML VI SOLN
500.0000 mL | Status: DC
  Administered 2017-11-20: 500 mL via INTRAVENOUS
  Filled 2017-11-20: qty 500

## 2017-11-20 MED ORDER — INSULIN ASPART 100 UNIT/ML ~~LOC~~ SOLN: 5.0000 [IU] | Freq: Once | SUBCUTANEOUS | Status: DC

## 2017-11-20 MED ORDER — ACD FORMULA A 0.73-2.45-2.2 GM/100ML VI SOLN
Status: AC
  Administered 2017-11-20: 500 mL via INTRAVENOUS
  Filled 2017-11-20: qty 500

## 2017-11-20 MED ORDER — PREDNISONE 20 MG PO TABS
60.0000 mg | ORAL_TABLET | Freq: Every day | ORAL | Status: DC
  Administered 2017-11-23 – 2017-11-26 (×4): 60 mg via ORAL
  Filled 2017-11-20 (×5): qty 3

## 2017-11-20 MED ORDER — FERROUS GLUCONATE 324 (38 FE) MG PO TABS
324.0000 mg | ORAL_TABLET | Freq: Every day | ORAL | Status: DC
  Administered 2017-11-20 – 2017-11-29 (×10): 324 mg via ORAL
  Filled 2017-11-20 (×11): qty 1

## 2017-11-20 MED ORDER — FUROSEMIDE 10 MG/ML IJ SOLN
60.0000 mg | Freq: Once | INTRAMUSCULAR | Status: AC
  Administered 2017-11-20: 60 mg via INTRAVENOUS
  Filled 2017-11-20: qty 6

## 2017-11-20 MED ORDER — AMLODIPINE BESYLATE 10 MG PO TABS
10.0000 mg | ORAL_TABLET | Freq: Every day | ORAL | Status: DC
  Administered 2017-11-20 – 2017-11-29 (×10): 10 mg via ORAL
  Filled 2017-11-20 (×10): qty 1

## 2017-11-20 MED ORDER — SODIUM BICARBONATE 650 MG PO TABS
1300.0000 mg | ORAL_TABLET | Freq: Two times a day (BID) | ORAL | Status: DC
  Administered 2017-11-20 – 2017-11-25 (×10): 1300 mg via ORAL
  Filled 2017-11-20 (×10): qty 2

## 2017-11-20 MED ORDER — CYCLOPHOSPHAMIDE 25 MG PO CAPS: 75.0000 mg | ORAL_CAPSULE | Freq: Every day | ORAL | Status: DC

## 2017-11-20 MED ORDER — ACETAMINOPHEN 325 MG PO TABS
650.0000 mg | ORAL_TABLET | Freq: Four times a day (QID) | ORAL | Status: DC | PRN
  Administered 2017-11-21: 650 mg via ORAL
  Filled 2017-11-20: qty 2

## 2017-11-20 MED ORDER — SODIUM CHLORIDE 0.9 % IV SOLN
INTRAVENOUS | Status: DC
  Administered 2017-11-20: 1000 mL via INTRAVENOUS

## 2017-11-20 MED ORDER — SODIUM CHLORIDE 0.9 % IV SOLN
2.0000 g | Freq: Once | INTRAVENOUS | Status: AC
  Administered 2017-11-20: 2 g via INTRAVENOUS
  Filled 2017-11-20: qty 20

## 2017-11-20 MED ORDER — CALCIUM CARBONATE ANTACID 500 MG PO CHEW
2.0000 | CHEWABLE_TABLET | ORAL | Status: AC
  Administered 2017-11-20: 400 mg via ORAL

## 2017-11-20 MED ORDER — ANTICOAGULANT SODIUM CITRATE 4% (200MG/5ML) IV SOLN
5.0000 mL | Freq: Once | Status: DC
  Filled 2017-11-20: qty 250

## 2017-11-20 MED ORDER — CARVEDILOL 12.5 MG PO TABS
12.5000 mg | ORAL_TABLET | Freq: Two times a day (BID) | ORAL | Status: DC
  Administered 2017-11-20 – 2017-11-29 (×18): 12.5 mg via ORAL
  Filled 2017-11-20 (×19): qty 1

## 2017-11-20 MED ORDER — CALCIUM CARBONATE ANTACID 500 MG PO CHEW
CHEWABLE_TABLET | ORAL | Status: AC
  Administered 2017-11-20: 400 mg via ORAL
  Filled 2017-11-20: qty 2

## 2017-11-20 MED ORDER — SODIUM CHLORIDE 0.9 % IV SOLN: Freq: Once | INTRAVENOUS | Status: DC

## 2017-11-20 MED ORDER — ACETAMINOPHEN 650 MG RE SUPP: 650.0000 mg | Freq: Four times a day (QID) | RECTAL | Status: DC | PRN

## 2017-11-20 MED ORDER — PANTOPRAZOLE SODIUM 40 MG PO TBEC
40.0000 mg | DELAYED_RELEASE_TABLET | Freq: Every day | ORAL | Status: DC
  Administered 2017-11-20 – 2017-11-28 (×9): 40 mg via ORAL
  Filled 2017-11-20 (×9): qty 1

## 2017-11-20 MED ORDER — DIPHENHYDRAMINE HCL 25 MG PO CAPS: 25.0000 mg | ORAL_CAPSULE | Freq: Four times a day (QID) | ORAL | Status: DC | PRN

## 2017-11-20 MED ORDER — CYCLOPHOSPHAMIDE 25 MG PO CAPS
75.0000 mg | ORAL_CAPSULE | Freq: Every day | ORAL | Status: DC
  Administered 2017-11-21 – 2017-11-24 (×4): 75 mg via ORAL
  Filled 2017-11-20 (×6): qty 3

## 2017-11-20 MED ORDER — DM-GUAIFENESIN ER 30-600 MG PO TB12: 1.0000 | ORAL_TABLET | Freq: Two times a day (BID) | ORAL | Status: DC | PRN

## 2017-11-20 MED ORDER — ONDANSETRON HCL 4 MG/2ML IJ SOLN: 4.0000 mg | Freq: Four times a day (QID) | INTRAMUSCULAR | Status: DC | PRN

## 2017-11-20 MED ORDER — SODIUM CHLORIDE 0.9 % IV SOLN
1000.0000 mg | Freq: Every day | INTRAVENOUS | Status: AC
  Administered 2017-11-21 – 2017-11-22 (×2): 1000 mg via INTRAVENOUS
  Filled 2017-11-20 (×3): qty 8

## 2017-11-20 MED ORDER — ACETAMINOPHEN 325 MG PO TABS: 650.0000 mg | ORAL_TABLET | ORAL | Status: DC | PRN

## 2017-11-20 MED ORDER — FUROSEMIDE 40 MG PO TABS
40.0000 mg | ORAL_TABLET | Freq: Two times a day (BID) | ORAL | Status: DC
  Administered 2017-11-20 – 2017-11-29 (×18): 40 mg via ORAL
  Filled 2017-11-20 (×18): qty 1

## 2017-11-20 MED ORDER — INSULIN GLARGINE 100 UNIT/ML ~~LOC~~ SOLN
10.0000 [IU] | SUBCUTANEOUS | Status: DC
  Administered 2017-11-21 – 2017-11-22 (×2): 10 [IU] via SUBCUTANEOUS
  Filled 2017-11-20 (×3): qty 0.1

## 2017-11-20 MED ORDER — INSULIN ASPART 100 UNIT/ML ~~LOC~~ SOLN
0.0000 [IU] | Freq: Every day | SUBCUTANEOUS | Status: DC
  Administered 2017-11-20: 5 [IU] via SUBCUTANEOUS
  Administered 2017-11-22: 4 [IU] via SUBCUTANEOUS
  Administered 2017-11-23: 3 [IU] via SUBCUTANEOUS
  Administered 2017-11-24: 2 [IU] via SUBCUTANEOUS

## 2017-11-20 MED ORDER — INSULIN ASPART 100 UNIT/ML ~~LOC~~ SOLN
0.0000 [IU] | Freq: Three times a day (TID) | SUBCUTANEOUS | Status: DC
  Administered 2017-11-20: 9 [IU] via SUBCUTANEOUS
  Administered 2017-11-21 (×2): 5 [IU] via SUBCUTANEOUS
  Administered 2017-11-21: 9 [IU] via SUBCUTANEOUS
  Administered 2017-11-22: 7 [IU] via SUBCUTANEOUS
  Administered 2017-11-23 (×2): 9 [IU] via SUBCUTANEOUS
  Administered 2017-11-24 (×2): 3 [IU] via SUBCUTANEOUS
  Administered 2017-11-24: 5 [IU] via SUBCUTANEOUS
  Administered 2017-11-25: 1 [IU] via SUBCUTANEOUS
  Administered 2017-11-26 (×2): 2 [IU] via SUBCUTANEOUS
  Administered 2017-11-27: 3 [IU] via SUBCUTANEOUS
  Administered 2017-11-27: 9 [IU] via SUBCUTANEOUS
  Administered 2017-11-27: 2 [IU] via SUBCUTANEOUS
  Administered 2017-11-28: 3 [IU] via SUBCUTANEOUS
  Administered 2017-11-28 – 2017-11-29 (×2): 2 [IU] via SUBCUTANEOUS

## 2017-11-20 MED ORDER — ADULT MULTIVITAMIN W/MINERALS CH
1.0000 | ORAL_TABLET | Freq: Every day | ORAL | Status: DC
  Administered 2017-11-20 – 2017-11-29 (×10): 1 via ORAL
  Filled 2017-11-20 (×12): qty 1

## 2017-11-20 MED ORDER — ONDANSETRON HCL 4 MG PO TABS
4.0000 mg | ORAL_TABLET | Freq: Four times a day (QID) | ORAL | Status: DC | PRN
  Administered 2017-11-21 – 2017-11-25 (×2): 4 mg via ORAL
  Filled 2017-11-20 (×2): qty 1

## 2017-11-20 NOTE — Consult Note (Signed)
[facility]  HISTORY AND PHYSICAL  William [name] is an 67 y.o. male.    Chief Complaint: hemoptysis  HPI: Pt is a 89M with cirrhosis, known anti-GBM disease who is receiving rx (prednisone, PLEX, cytoxan, s/p kidney biopsy on 4/15) now presents to ED with hemoptysis.  Was discharged 4/16 to complete outpt PLEX which he attended yesterday.  After PLEX started to have recurrence of hemoptysis, came back to ED.  We are now consulted.   Hgb 10.2--> 9.1, plts 47.  Active hemoptysis with dime-sized clots.     PMH: Past Medical History:  Diagnosis Date  . Cirrhosis ([place])   . CKD (chronic kidney disease), stage IV ([place])   . Diabetes mellitus without complication ([place])   . Grade I diastolic dysfunction 37/48/2707  . History of anemia due to chronic kidney disease   . Hypertension   . Pancytopenia ([place]) 08/20/2017   PSH: Past Surgical History:  Procedure Laterality Date  . EYE SURGERY Bilateral    Cataract  . IR FLUORO GUIDE CV LINE RIGHT  11/10/2017  . IR US GUIDE VASC ACCESS RIGHT  11/10/2017  . NECK SURGERY       Past Medical History:  Diagnosis Date  . Cirrhosis ([place])   . CKD (chronic kidney disease), stage IV ([place])   . Diabetes mellitus without complication ([place])   . Grade I diastolic dysfunction 86/75/4492  . History of anemia due to chronic kidney disease   . Hypertension   . Pancytopenia ([place]) 08/20/2017    Medications:   Scheduled: . calcium carbonate      . calcium carbonate  2 tablet Oral Q3H  . insulin aspart  0-5 Units Subcutaneous QHS  . insulin aspart  0-9 Units Subcutaneous TID WC     (Not in a hospital admission)  ALLERGIES:   Allergies  Allergen Reactions  . Heparin     Patient is Muslim and is not permitted Pork derative due to religious belief)    FAM HX: Family History  Problem Relation Age of Onset  . Diabetes Mellitus II Sister   . Stroke Brother     Social History:   reports that he has never smoked. He has never used  smokeless tobacco. He reports that he has current or past drug history. He reports that he does not drink alcohol.  ROS: ROS: all other systems reviewed and are negative except as per HPI  Blood pressure (!) 162/72, pulse 72, temperature 98 F (36.7 C), temperature source Oral, resp. rate 16, SpO2 98 %. PHYSICAL EXAM: Physical Exam  GEN sitting up in bed, coughing HEENT EOMI PERRL NECK no JVD, TDC intact PULM bilateral crackles CV tachycardic ABD soft, some distention EXT no LE edema NEURO AAO x 3 SKIN nonfocal   Results for orders placed or performed during the hospital encounter of 11/19/17 (from the past 48 hour(s))  Basic metabolic panel     Status: Abnormal   Collection Time: 11/19/17  7:37 PM  Result Value Ref Range   Sodium 139 135 - 145 mmol/L   Potassium 4.3 3.5 - 5.1 mmol/L   Chloride 109 101 - 111 mmol/L   CO2 17 (L) 22 - 32 mmol/L   Glucose, Bld 429 (H) 65 - 99 mg/dL   BUN 87 (H) 6 - 20 mg/dL   Creatinine, Ser 3.71 (H) 0.61 - 1.24 mg/dL   Calcium 7.3 (L) 8.9 - 10.3 mg/dL   GFR calc non Af Amer 16 (L) >60 mL/min  GFR calc Af Amer 18 (L) >60 mL/min    Comment: (NOTE) The eGFR has been calculated using the CKD EPI equation. This calculation has not been validated in all clinical situations. eGFR's persistently <60 mL/min signify possible Chronic Kidney Disease.    Anion gap 13 5 - 15    Comment: Performed at [facility] [address], [place], [place] [address]  CBC     Status: Abnormal   Collection Time: 11/19/17  7:37 PM  Result Value Ref Range   WBC 6.5 4.0 - 10.5 K/uL   RBC 3.55 (L) 4.22 - 5.81 MIL/uL   Hemoglobin 9.1 (L) 13.0 - 17.0 g/dL   HCT 27.2 (L) 39.0 - 52.0 %   MCV 76.6 (L) 78.0 - 100.0 fL   MCH 25.6 (L) 26.0 - 34.0 pg   MCHC 33.5 30.0 - 36.0 g/dL   RDW 17.6 (H) 11.5 - 15.5 %   Platelets 47 (L) 150 - 400 K/uL    Comment: CONSISTENT WITH PREVIOUS RESULT Performed at [facility] [address], [place], [place] [address]    I-stat troponin, ED     Status: None   Collection Time: 11/19/17  7:41 PM  Result Value Ref Range   Troponin i, poc 0.02 0.00 - 0.08 ng/mL   Comment 3            Comment: Due to the release kinetics of cTnI, a negative result within the first hours of the onset of symptoms does not rule out myocardial infarction with certainty. If myocardial infarction is still suspected, repeat the test at appropriate intervals.   I-stat troponin, ED     Status: None   Collection Time: 11/20/17  2:39 AM  Result Value Ref Range   Troponin i, poc 0.02 0.00 - 0.08 ng/mL   Comment 3            Comment: Due to the release kinetics of cTnI, a negative result within the first hours of the onset of symptoms does not rule out myocardial infarction with certainty. If myocardial infarction is still suspected, repeat the test at appropriate intervals.   CBG monitoring, ED     Status: Abnormal   Collection Time: 11/20/17  6:52 AM  Result Value Ref Range   Glucose-Capillary 366 (H) 65 - 99 mg/dL   Comment 1 Notify RN    Comment 2 Document in Chart   Prepare fresh frozen plasma     Status: None (Preliminary result)   Collection Time: 11/20/17  8:28 AM  Result Value Ref Range   Unit Number [id number]    Blood Component Type THAWED PLASMA    Unit division 00    Status of Unit ISSUED    Transfusion Status OK TO TRANSFUSE    Unit Number [id number]    Blood Component Type THAWED PLASMA    Unit division 00    Status of Unit ISSUED    Transfusion Status      OK TO TRANSFUSE Performed at [facility] [address] [place], [place] [address]   Therapeutic plasma exchange (blood bank)     Status: None (Preliminary result)   Collection Time: 11/20/17  8:56 AM  Result Value Ref Range   Plasma Exchange 3000 ML ORDERED 3000 ML THAWED    Plasma volume needed 3,000    Unit Number [id number]    Blood Component Type THAWED PLASMA    Unit division 00    Status of Unit  ISSUED    Transfusion  Status OK TO TRANSFUSE    Unit Number [id number]    Blood Component Type THAWED PLASMA    Unit division 00    Status of Unit ISSUED    Transfusion Status OK TO TRANSFUSE    Unit Number [id number]    Blood Component Type THAWED PLASMA    Unit division 00    Status of Unit ISSUED    Transfusion Status OK TO TRANSFUSE    Unit Number [id number]    Blood Component Type THAWED PLASMA    Unit division 00    Status of Unit ISSUED    Transfusion Status      OK TO TRANSFUSE Performed at [facility] [address] [place], [place] [address]    Unit Number [id number]    Blood Component Type THAWED PLASMA    Unit division 00    Status of Unit ISSUED    Transfusion Status OK TO TRANSFUSE    Unit Number [id number]    Blood Component Type THAWED PLASMA    Unit division 00    Status of Unit ISSUED    Transfusion Status OK TO TRANSFUSE    Unit Number [id number]    Blood Component Type THAWED PLASMA    Unit division 00    Status of Unit ISSUED    Transfusion Status OK TO TRANSFUSE    Unit Number [id number]    Blood Component Type THAWED PLASMA    Unit division 00    Status of Unit ISSUED    Transfusion Status OK TO TRANSFUSE    Unit Number [id number]    Blood Component Type THAWED PLASMA    Unit division 00    Status of Unit ISSUED    Transfusion Status OK TO TRANSFUSE    Unit Number [id number]    Blood Component Type THAWED PLASMA    Unit division 00    Status of Unit ISSUED    Transfusion Status OK TO TRANSFUSE     Dg Chest 2 View  Result Date: 11/19/2017 CLINICAL DATA:  Chest pain EXAM: CHEST - 2 VIEW COMPARISON:  11/11/2017 FINDINGS: Bilateral airspace disease with interval improvement. No effusion. Dual lumen right jugular central venous catheter tip in the SVC unchanged IMPRESSION: Interval improvement in bilateral airspace disease. Electronically Signed   By: Franchot Gallo M.D.   On: 11/19/2017 20:48   Ct Chest Wo Contrast  Result  Date: 11/20/2017 CLINICAL DATA:  Hemoptysis EXAM: CT CHEST WITHOUT CONTRAST TECHNIQUE: Multidetector CT imaging of the chest was performed following the standard protocol without IV contrast. COMPARISON:  11/19/2017 chest x-ray.  Chest CT 11/07/2017 FINDINGS: Cardiovascular: Scattered coronary artery and aortic calcifications. Cardiomegaly. No evidence of aortic aneurysm. Small pericardial effusion. Mediastinum/Nodes: Scattered small mediastinal lymph nodes, likely reactive. No mediastinal, hilar, or axillary adenopathy. Lungs/Pleura: Diffuse bilateral airspace opacities, similar to prior CT. Trace left pleural effusion. Bronchial wall thickening noted in the lower lobes, stable. Upper Abdomen: Changes of cirrhosis. Spleen appears enlarged but is incompletely visualized. Musculoskeletal: Chest wall soft tissues are unremarkable. No acute bony abnormality. IMPRESSION: Diffuse bilateral airspace disease, most confluent in the lower lobes but also noted in the upper lobes. Findings are similar to prior CT. Bronchial wall thickening in the lower lobes. Findings could reflect edema, hemorrhage, or infection. Small left pleural effusion and pericardial effusion. Cardiomegaly. Coronary artery disease, aortic atherosclerosis. Changes of cirrhosis. Suspected splenomegaly although the spleen is not imaged in its entirety. Electronically Signed  By: Rolm Baptise M.D.   On: 11/20/2017 10:43    Assessment/Plan  1.  Pulmonary-renal syndrome, anti-GBM: will need emergent PLEX with FFP.  This will be done daily until hemoptysis resolves.  Needs plt transfusion as well.  CT chest.  May need bronch.  Will f/u results of kidney biopsy.  I'm going to re-pulse with solumedrol and continue cytoxan 75 mg daily (reduced dose due to cirrhosis and pancytopenia).  2.  Cirrhosis: had to get Neupogen before discharge  3.  Hemoptysis:  Needs serial CBC, close monitoring, ? bronch  Aziz Slape 11/20/2017, 12:26 PM

## 2017-11-20 NOTE — ED Notes (Signed)
Patient presents to ed with c/o coughing up blood , onset yest. Family with patient states he was discharged from hospital on Tues. For same. Had a kidney bx. On Monday and they are waiting to hear results. Had  Plasmapheresis yest.

## 2017-11-20 NOTE — Progress Notes (Addendum)
Inpatient Diabetes Program Recommendations  AACE/ADA: New Consensus Statement on Inpatient Glycemic Control (2015)  Target Ranges:  Prepandial:   less than 140 mg/dL      Peak postprandial:   less than 180 mg/dL (1-2 hours)      Critically ill patients:  140 - 180 mg/dL   Lab Results  Component Value Date   GLUCAP 366 (H) 11/20/2017   HGBA1C 8.1 06/20/2017    Review of Glycemic Control Results for William [name], William [name] (MRN [id number]) as of 11/20/2017 11:14  Ref. Range 11/18/2017 11:55 11/20/2017 06:52  Glucose-Capillary Latest Ref Range: 65 - 99 mg/dL 178 (H) 366 (H)   Diabetes history: Type 2 DM Outpatient Diabetes medicaions: Novolin 70/30 18 units BID Current orders for Inpatient glycemic control: Novolog 0-9 units TID, Novolog 0-5 units QHS, Solumedrol 1000 mg QD  Inpatient Diabetes Program Recommendations:    Last BS was 366 mg/dL. Per MD note, plan to start Lantus 10 units QD, however, not seeing order placement. In the setting of steroids, expect BS to increase. Additionally, CO2 down to 17 and anion gap up to 13. Would recommend increasing this dose to 20 units QD.  Also, if patient eating consistently, consider adding meal coverage Novolog 3 units TID (assuming patient is eating >50%). Text page sent.   Spoke with RN regarding AM BS of 366 mg/dL at 0652 without admin of correction. RN informed of CO2, and that I have text paged Dr Rockne Menghini and to be expecting new orders. RN just assumed patient and plans to intervene.   Thanks, Bronson Curb, MSN, RNC-OB Diabetes Coordinator [phone] (8a-5p)

## 2017-11-20 NOTE — H&P (Addendum)
History and Physical:    William [name]   LPF:[id number] DOB: May 12, 1951 DOA: 11/19/2017  Referring MD/provider: Monico Blitz, PA-C PCP: Gildardo Pounds, NP   Patient coming from: Home  Chief Complaint: Hemoptysis  History of Present Illness:   William [name] is an 67 y.o. male with a PMH of stage IV CKD, cirrhosis, diabetes, diastolic CHF recently diagnosed Goodpasture's disease on outpatient plasmapheresis, recently hospitalized 11/06/17-11/18/17 where therapy with Solu-Medrol and plasmapheresis as well as hemodialysis was initiated. Underwent a renal biopsy 11/17/17 and was discharged on cyclophosphamide under the direction of nephrology.at discharge, he was also noted to have pancytopenia which was felt to be reactive with recommendations for transfusion as needed per hematology. He received 3 units of packed red blood cells on 11/12/17, a dose of Granix, and was started on Aranesp as well. As mentioned, he was discharged 11/17/17 to complete his outpatient course of plasmapheresis.  On 11/19/17, the patient developed significant hemoptysis.  He also reported intermittent exertional chest pain associated with lower extremity edema.  He has been compliant with Lasix and prednisone treatment, but pharmacy did not have Cytoxan in stock so he was unable to start that medication.  ED Course: Vital signs were stable.  Labs were significant for negative troponin.  Hemoglobin was stable at 9.1 (discharge hemoglobin 10.2), platelets 47, BUN 87, creatinine 3.71. Given a dose of lasix 60 mg x 1.  ROS:   Review of Systems  Constitutional: Positive for malaise/fatigue.  HENT: Negative.   Eyes: Negative.   Respiratory: Positive for cough and hemoptysis. Negative for shortness of breath.   Cardiovascular: Positive for leg swelling. Negative for chest pain.  Gastrointestinal: Negative.   Genitourinary: Negative.   Musculoskeletal: Negative.   Skin: Negative.   Neurological: Positive for weakness.    Endo/Heme/Allergies: Negative.   Psychiatric/Behavioral: Negative.     Past Medical History:   Past Medical History:  Diagnosis Date  . Cirrhosis ([place])   . CKD (chronic kidney disease), stage IV ([place])   . Diabetes mellitus without complication ([place])   . Grade I diastolic dysfunction 53/29/9242  . History of anemia due to chronic kidney disease   . Hypertension   . Pancytopenia ([place]) 08/20/2017    Past Surgical History:   Past Surgical History:  Procedure Laterality Date  . EYE SURGERY Bilateral    Cataract  . IR FLUORO GUIDE CV LINE RIGHT  11/10/2017  . IR US GUIDE VASC ACCESS RIGHT  11/10/2017  . NECK SURGERY      Social History:   Social History   Socioeconomic History  . Marital status: Single    Spouse name: Not on file  . Number of children: Not on file  . Years of education: Not on file  . Highest education level: Not on file  Occupational History  . Not on file  Social Needs  . Financial resource strain: Not on file  . Food insecurity:    Worry: Not on file    Inability: Not on file  . Transportation needs:    Medical: Not on file    Non-medical: Not on file  Tobacco Use  . Smoking status: Never Smoker  . Smokeless tobacco: Never Used  Substance and Sexual Activity  . Alcohol use: No  . Drug use: Not Currently  . Sexual activity: Not on file  Lifestyle  . Physical activity:    Days per week: Not on file    Minutes per session: Not on file  . Stress:  Not on file  Relationships  . Social connections:    Talks on phone: Not on file    Gets together: Not on file    Attends religious service: Not on file    Active member of club or organization: Not on file    Attends meetings of clubs or organizations: Not on file    Relationship status: Not on file  . Intimate partner violence:    Fear of current or ex partner: Not on file    Emotionally abused: Not on file    Physically abused: Not on file    Forced sexual activity: Not on file  Other Topics  Concern  . Not on file  Social History Narrative  . Not on file    Allergies   Heparin  Family history:   Family History  Problem Relation Age of Onset  . Diabetes Mellitus II Sister   . Stroke Brother     Current Medications:   Prior to Admission medications   Medication Sig Start Date End Date Taking? Authorizing Provider  amLODipine (NORVASC) 10 MG tablet Take 1 tablet (10 mg total) daily by mouth. 06/20/17 11/06/24 Yes Gildardo Pounds, NP  carvedilol (COREG) 12.5 MG tablet Take 12.5 mg by mouth 2 (two) times daily with a meal.   Yes [provider]  dextromethorphan-guaiFENesin (MUCINEX DM) 30-600 MG 12hr tablet Take 1 tablet by mouth 2 (two) times daily as needed for cough. 11/18/17  Yes Regalado, Belkys A, MD  ferrous gluconate (IRON 27) 240 (27 FE) MG tablet Take 1 tablet by mouth daily.   Yes [provider]  furosemide (LASIX) 40 MG tablet Take 1 tablet (40 mg total) by mouth 2 (two) times daily. 11/18/17 11/18/18 Yes Regalado, Belkys A, MD  insulin NPH-regular Human (NOVOLIN 70/30) (70-30) 100 UNIT/ML injection Inject 18 Units into the skin 2 (two) times daily with a meal. 11/18/17  Yes Regalado, Belkys A, MD  Multiple Vitamins-Minerals (MULTIVITAMIN WITH MINERALS) tablet Take 1 tablet by mouth daily.   Yes [provider]  pantoprazole (PROTONIX) 40 MG tablet Take 1 tablet (40 mg total) by mouth daily. Take 30-60 min before first meal of the day 10/13/17  Yes Tanda Rockers, MD  predniSONE (DELTASONE) 20 MG tablet Take 3 tablets (60 mg total) by mouth daily with breakfast. 11/18/17  Yes Regalado, Belkys A, MD  sodium bicarbonate 650 MG tablet Take 2 tablets (1,300 mg total) by mouth 2 (two) times daily. 11/18/17  Yes Regalado, Belkys A, MD  cyclophosphamide (CYTOXAN) 25 MG capsule Take 3 capsules (75 mg total) by mouth daily. Give on an empty stomach 1 hour before or 2 hours after meals. 11/18/17   Elmarie Shiley, MD    Physical Exam:   Vitals:     11/19/17 1847 11/19/17 2058 11/20/17 0100 11/20/17 0402  BP: 129/64 140/70 (!) 156/82 (!) 162/72  Pulse: 78 78 76 72  Resp: 16 16 14 16   Temp: 98 F (36.7 C) 98.9 F (37.2 C) 98.3 F (36.8 C) 98 F (36.7 C)  TempSrc: Oral Oral Oral Oral  SpO2: 94% 96% 95% 98%     Physical Exam: Blood pressure (!) 162/72, pulse 72, temperature 98 F (36.7 C), temperature source Oral, resp. rate 16, SpO2 98 %. Gen: Ill-appearing male who is lethargic. Head: Normocephalic, atraumatic. Eyes: Pupils equal, round and reactive to light. Extraocular movements intact.  Sclerae nonicteric. No lid lag. Mouth: Oropharynx reveals dry mucous membranes.  Neck: Supple, no thyromegaly,  no lymphadenopathy, no jugular venous distention. Chest: Lungs notable for bibasilar crackles. CV: Heart sounds are regular with an S1, S2. No murmurs, rubs, clicks, or gallops.  Abdomen: Soft, nontender, nondistended with normal active bowel sounds. No hepatosplenomegaly or palpable masses. Extremities: Extremities are without clubbing, or cyanosis.  2+ edema, worse on the right. Pedal pulses 2+.  Skin: Warm and dry. No rashes, lesions or wounds. Neuro: Mildly lethargic but responsive; grossly nonfocal.  Psych: Insight is good and judgment is appropriate. Mood and affect flat.   Data Review:    Labs: Basic Metabolic Panel: Recent Labs  Lab 11/14/17 0306  11/15/17 0258  11/16/17 0158 11/17/17 0301 11/17/17 1617 11/18/17 0219 11/19/17 0849 11/19/17 1937  NA 141   < > 142   < > 141 141 146* 142 140 139  K 4.1   < > 4.1   < > 4.0 3.9 4.0 3.9 4.5 4.3  CL 104   < > 109   < > 110 107 100* 111 103 109  CO2 24  --  21*  --  21* 22  --  22  --  17*  GLUCOSE 135*   < > 155*   < > 237* 153* 224* 136* 343* 429*  BUN 82*   < > 82*   < > 78* 81* 62* 73* 70* 87*  CREATININE 3.77*   < > 3.54*   < > 3.34* 3.33* 3.50* 3.38* 3.60* 3.71*  CALCIUM 8.0*  --  7.6*  --  7.5* 7.9*  --  7.5*  --  7.3*  PHOS 4.8*  --  4.5  --  4.4 4.4  --   4.9*  --   --    < > = values in this interval not displayed.   Liver Function Tests: Recent Labs  Lab 11/14/17 0306 11/15/17 0258 11/16/17 0158 11/17/17 0301 11/18/17 0219  ALBUMIN 4.6 4.4 4.5 4.2 4.0   No results for input(s): LIPASE, AMYLASE in the last 168 hours. No results for input(s): AMMONIA in the last 168 hours. CBC: Recent Labs  Lab 11/14/17 0306  11/15/17 0754  11/16/17 0158 11/17/17 0301 11/17/17 1617 11/18/17 0219 11/19/17 0849 11/19/17 1937  WBC 3.2*  --  3.5*  --  3.4* 3.4*  --  3.2*  --  6.5  NEUTROABS 2.6  --   --   --   --  2.7  --  2.6  --   --   HGB 9.2*   < > 9.9*   < > 10.0* 9.8* 10.5* 9.0* 10.2* 9.1*  HCT 27.5*   < > 30.1*   < > 29.8* 29.7* 31.0* 27.7* 30.0* 27.2*  MCV 76.2*  --  75.3*  --  76.0* 75.8*  --  75.7*  --  76.6*  PLT 55*  --  67*  --  53* 64*  --  63*  --  47*   < > = values in this interval not displayed.   Cardiac Enzymes: No results for input(s): CKTOTAL, CKMB, CKMBINDEX, TROPONINI in the last 168 hours.  BNP (last 3 results) Recent Labs    09/25/17 1614  PROBNP 380.0*   CBG: Recent Labs  Lab 11/17/17 1850 11/17/17 2047 11/18/17 0848 11/18/17 1155 11/20/17 0652  GLUCAP 217* 218* 125* 178* 366*    Urinalysis    Component Value Date/Time   COLORURINE YELLOW 11/10/2017 1412   APPEARANCEUR HAZY (A) 11/10/2017 1412   LABSPEC 1.014 11/10/2017 1412   PHURINE 5.0 11/10/2017 1412  GLUCOSEU 50 (A) 11/10/2017 1412   HGBUR SMALL (A) 11/10/2017 1412   BILIRUBINUR NEGATIVE 11/10/2017 1412   BILIRUBINUR neg 09/14/2015 1441   KETONESUR NEGATIVE 11/10/2017 1412   PROTEINUR >=300 (A) 11/10/2017 1412   UROBILINOGEN 0.2 09/14/2015 1441   UROBILINOGEN 0.2 01/27/2013 2052   NITRITE NEGATIVE 11/10/2017 1412   LEUKOCYTESUR NEGATIVE 11/10/2017 1412      Radiographic Studies: Dg Chest 2 View  Personally reviewed.  When compared to chest x-ray done 11/11/17, there has been significant interval improvement.  Some airspace  disease is still evident, but there is no frank infiltrates or pleural effusions.   EKG: Independently reviewed.  Normal sinus rhythm at 77 bpm.  No ischemic changes appreciated.   Assessment/Plan:   Principal Problem:   Goodpasture's syndrome ([place]) with associated hemoptysis and normocytic anemia Resume Cytoxan and prednisone.  Discussed case with nephrologist on call who will see the patient in consultation and initiate daily plasmapheresis.  Will give 2 units of FFP. We will need to ascertain that pharmacy has Cytoxan at discharge.  Hemoglobin currently stable but consider transfusion if drops further.  Continue iron therapy.  Active Problems:   Type II diabetes mellitus with renal manifestations (HCC) Takes 70/30 at home.  We will treat with Lantus 10 units and insulin sensitive SSI while here.    Pancytopenia ([place]) Felt to be reactive by hematologist.  No current indication for transfusion of platelets or blood.  Monitor CBC.    Acute renal failure superimposed on stage 4 chronic kidney disease (HCC)/metabolic acidosis Nephrologist consulted.  Baseline creatinine appears to be around 3.3-3.5.  Current creatinine slightly higher than baseline.  Continue sodium bicarbonate for metabolic acidosis.     Essential hypertension Continue Norvasc and Norvasc.    Asymmetric edema of both lower extremities Continue Lasix.  Will get a right lower extremity Doppler study to rule out DVT.   Other information:   DVT prophylaxis: SCDs ordered. Code Status: Full code. Family Communication: Son at the bedside. Disposition Plan: Anticipate discharge home tomorrow if hemoptysis resolved. Consults called: Nephrology: Spoke with Dr. Hollie Salk. Admission status: Observation.   Margreta Journey Salsabeel [place] [facility] Pager [phone] Cell: [phone]   If 7PM-7AM, please contact night-coverage [email or web address] Password [email or web address] 11/20/2017, 8:01 AM

## 2017-11-20 NOTE — ED Provider Notes (Signed)
[facility] EMERGENCY DEPARTMENT Provider Note   CSN: [id number] Arrival date & time: 11/19/17  1812     History   Chief Complaint Chief Complaint  Patient presents with  . Hemoptysis    HPI   Blood pressure (!) 162/72, pulse 72, temperature 98 F (36.7 C), temperature source Oral, resp. rate 16, SpO2 98 %.  William [name] is a 67 y.o. male with PMH significant for IDDM, CKD (stage IV), CHF, and cirrhosis recently admitted to the hospital for hemoptysis, what is thought to be Goodpasture's syndrome.  He had a renal biopsy several days ago and results have not yet come back.  He was discharged 2 days ago, he went to plasmapheresis yesterday.  He started having significant hemoptysis yesterday, the hemoptysis had resolved while he was in the hospital but returned suddenly and increased in severity.  He is reporting intermittent exertional chest pain and lower extremity edema with no shortness of breath, his orthopnea has improved since being admitted to the hospital several weeks ago.  He is not currently experiencing any chest pain.  He has been compliant with his Lasix.  Past Medical History:  Diagnosis Date  . Cirrhosis ([place])   . CKD (chronic kidney disease), stage IV ([place])   . Diabetes mellitus without complication ([place])   . Grade I diastolic dysfunction 51/70/0174  . History of anemia due to chronic kidney disease   . Hypertension   . Pancytopenia ([place]) 08/20/2017    Patient Active Problem List   Diagnosis Date Noted  . Goodpasture's syndrome ([place]) with associated hemoptysis 11/20/2017  . Asymmetric edema of both lower extremities 11/20/2017  . Metabolic acidosis 94/49/6759  . Hemoptysis   . AKI (acute kidney injury) ([place]) 11/07/2017  . Acute hypoxemic respiratory failure ([place]) 11/07/2017  . Acute on chronic respiratory failure with hypoxia ([place]) 11/06/2017  . Acute on chronic diastolic CHF (congestive heart failure) ([place]) 11/06/2017  . Pancytopenia  ([place]) 11/06/2017  . Acute renal failure superimposed on stage 4 chronic kidney disease ([place]) 11/06/2017  . GERD (gastroesophageal reflux disease) 11/06/2017  . Essential hypertension 11/06/2017  . Dyspnea on exertion 09/25/2017  . Type II diabetes mellitus with renal manifestations ([place]) 08/25/2015  . Chronic cough 06/10/2013  . Chronic neck pain 06/10/2013    Past Surgical History:  Procedure Laterality Date  . EYE SURGERY Bilateral    Cataract  . IR FLUORO GUIDE CV LINE RIGHT  11/10/2017  . IR US GUIDE VASC ACCESS RIGHT  11/10/2017  . NECK SURGERY          Home Medications    Prior to Admission medications   Medication Sig Start Date End Date Taking? Authorizing Provider  amLODipine (NORVASC) 10 MG tablet Take 1 tablet (10 mg total) daily by mouth. 06/20/17 11/06/24 Yes Gildardo Pounds, NP  carvedilol (COREG) 12.5 MG tablet Take 12.5 mg by mouth 2 (two) times daily with a meal.   Yes [provider]  dextromethorphan-guaiFENesin (MUCINEX DM) 30-600 MG 12hr tablet Take 1 tablet by mouth 2 (two) times daily as needed for cough. 11/18/17  Yes Regalado, Belkys A, MD  ferrous gluconate (IRON 27) 240 (27 FE) MG tablet Take 1 tablet by mouth daily.   Yes [provider]  furosemide (LASIX) 40 MG tablet Take 1 tablet (40 mg total) by mouth 2 (two) times daily. 11/18/17 11/18/18 Yes Regalado, Belkys A, MD  insulin NPH-regular Human (NOVOLIN 70/30) (70-30) 100 UNIT/ML injection Inject 18 Units into the skin 2 (  two) times daily with a meal. 11/18/17  Yes Regalado, Belkys A, MD  Multiple Vitamins-Minerals (MULTIVITAMIN WITH MINERALS) tablet Take 1 tablet by mouth daily.   Yes [provider]  pantoprazole (PROTONIX) 40 MG tablet Take 1 tablet (40 mg total) by mouth daily. Take 30-60 min before first meal of the day 10/13/17  Yes Tanda Rockers, MD  predniSONE (DELTASONE) 20 MG tablet Take 3 tablets (60 mg total) by mouth daily with breakfast. 11/18/17  Yes Regalado, Belkys A,  MD  sodium bicarbonate 650 MG tablet Take 2 tablets (1,300 mg total) by mouth 2 (two) times daily. 11/18/17  Yes Regalado, Belkys A, MD  cyclophosphamide (CYTOXAN) 25 MG capsule Take 3 capsules (75 mg total) by mouth daily. Give on an empty stomach 1 hour before or 2 hours after meals. 11/18/17   Regalado, Cassie Freer, MD    Family History Family History  Problem Relation Age of Onset  . Diabetes Mellitus II Sister   . Stroke Brother     Social History Social History   Tobacco Use  . Smoking status: Never Smoker  . Smokeless tobacco: Never Used  Substance Use Topics  . Alcohol use: No  . Drug use: Not Currently     Allergies   Heparin   Review of Systems Review of Systems  A complete review of systems was obtained and all systems are negative except as noted in the HPI and PMH.   Physical Exam Updated Vital Signs BP (!) 162/72   Pulse 72   Temp 98 F (36.7 C) (Oral)   Resp 16   SpO2 98%   Physical Exam  Constitutional: He is oriented to person, place, and time. He appears well-developed and well-nourished. No distress.  Patient has a emesis bag with him with an impressive mount of hemoptysis in the bag, son states that there is blood all over the house.   HENT:  Head: Normocephalic and atraumatic.  Mouth/Throat: Oropharynx is clear and moist.  Eyes: Pupils are equal, round, and reactive to light. Conjunctivae and EOM are normal.  Neck: Normal range of motion.  Cardiovascular: Normal rate, regular rhythm and intact distal pulses.  Pulmonary/Chest: Effort normal and breath sounds normal.  Crackles at the bases bilaterally  Abdominal: Soft. There is no tenderness.  Musculoskeletal: Normal range of motion. He exhibits edema.  1+ pitting edema to knee bilaterally  Neurological: He is alert and oriented to person, place, and time.  Skin: He is not diaphoretic.  Psychiatric: He has a normal mood and affect.  Nursing note and vitals reviewed.    ED Treatments /  Results  Labs (all labs ordered are listed, but only abnormal results are displayed) Labs Reviewed  BASIC METABOLIC PANEL - Abnormal; Notable for the following components:      Result Value   CO2 17 (*)    Glucose, Bld 429 (*)    BUN 87 (*)    Creatinine, Ser 3.71 (*)    Calcium 7.3 (*)    GFR calc non Af Amer 16 (*)    GFR calc Af Amer 18 (*)    All other components within normal limits  CBC - Abnormal; Notable for the following components:   RBC 3.55 (*)    Hemoglobin 9.1 (*)    HCT 27.2 (*)    MCV 76.6 (*)    MCH 25.6 (*)    RDW 17.6 (*)    Platelets 47 (*)    All other components within normal  limits  CBG MONITORING, ED - Abnormal; Notable for the following components:   Glucose-Capillary 366 (*)    All other components within normal limits  I-STAT TROPONIN, ED  I-STAT TROPONIN, ED  PREPARE FRESH FROZEN PLASMA  TYPE AND SCREEN    EKG EKG Interpretation  Date/Time:  Wednesday November 19 2017 18:47:26 EDT Ventricular Rate:  77 PR Interval:  134 QRS Duration: 68 QT Interval:  416 QTC Calculation: 470 R Axis:   10 Text Interpretation:  Normal sinus rhythm Confirmed by Randal Buba, April ([id number]) on 11/20/2017 3:53:12 AM Also confirmed by Randal Buba, April ([id number]), editor Hattie Perch [phone])  on 11/20/2017 6:53:06 AM   Radiology Dg Chest 2 View  Result Date: 11/19/2017 CLINICAL DATA:  Chest pain EXAM: CHEST - 2 VIEW COMPARISON:  11/11/2017 FINDINGS: Bilateral airspace disease with interval improvement. No effusion. Dual lumen right jugular central venous catheter tip in the SVC unchanged IMPRESSION: Interval improvement in bilateral airspace disease. Electronically Signed   By: Franchot Gallo M.D.   On: 11/19/2017 20:48    Procedures Procedures (including critical care time)  Medications Ordered in ED Medications  0.9 %  sodium chloride infusion (1,000 mLs Intravenous New Bag/Given 11/20/17 0721)  insulin aspart (novoLOG) injection 0-9 Units (has no administration  in time range)  insulin aspart (novoLOG) injection 0-5 Units (has no administration in time range)  insulin aspart (novoLOG) injection 5 Units (has no administration in time range)  0.9 %  sodium chloride infusion (has no administration in time range)  furosemide (LASIX) injection 60 mg (60 mg Intravenous Given 11/20/17 0723)     Initial Impression / Assessment and Plan / ED Course  I have reviewed the triage vital signs and the nursing notes.  Pertinent labs & imaging results that were available during my care of the patient were reviewed by me and considered in my medical decision making (see chart for details).     Vitals:   11/19/17 1847 11/19/17 2058 11/20/17 0100 11/20/17 0402  BP: 129/64 140/70 (!) 156/82 (!) 162/72  Pulse: 78 78 76 72  Resp: 16 16 14 16   Temp: 98 F (36.7 C) 98.9 F (37.2 C) 98.3 F (36.8 C) 98 F (36.7 C)  TempSrc: Oral Oral Oral Oral  SpO2: 94% 96% 95% 98%    Medications  0.9 %  sodium chloride infusion (1,000 mLs Intravenous New Bag/Given 11/20/17 0721)  insulin aspart (novoLOG) injection 0-9 Units (has no administration in time range)  insulin aspart (novoLOG) injection 0-5 Units (has no administration in time range)  insulin aspart (novoLOG) injection 5 Units (has no administration in time range)  0.9 %  sodium chloride infusion (has no administration in time range)  furosemide (LASIX) injection 60 mg (60 mg Intravenous Given 11/20/17 0723)    William [name] is 67 y.o. male presenting with recurrence of hemoptysis yesterday after receiving his plasmapheresis for what is assumed to be Goodpasture's syndrome.  He is having what appears to be exertional chest pain, no active chest pain at this time.  EKG unchanged, troponin negative.  Clinically patient with CHF exacerbation, bilateral lower extremity pitting edema with crackles in the lungs.  He has been compliant with his 40 mg of Lasix, will give 60 IV.  Patient also has elevated blood sugar, normal  anion gap.  Creatinine is at his baseline.  Thrombocytopenia thought to be secondary to cirrhosis which was seen on CT, likely contributing to his profuse hemoptysis.  Doubt PE with reassuring vital signs of heart rate  of 72, O2 sat of 98 and a respiration rate of 16, physical exam is also not consistent with DVT.   Discussed with Dr. Rockne Menghini who will consult nephrology and admit the patient.     Final Clinical Impressions(s) / ED Diagnoses   Final diagnoses:  Hemoptysis  Hyperglycemia without ketosis    ED Discharge Orders    None       Karen Kays Charna Elizabeth 11/20/17 0908    Ripley Fraise, MD 11/20/17 [phone]

## 2017-11-21 ENCOUNTER — Encounter (HOSPITAL_COMMUNITY): Payer: Medicaid Other

## 2017-11-21 DIAGNOSIS — I5032 Chronic diastolic (congestive) heart failure: Secondary | ICD-10-CM | POA: Diagnosis not present

## 2017-11-21 DIAGNOSIS — Z833 Family history of diabetes mellitus: Secondary | ICD-10-CM | POA: Diagnosis not present

## 2017-11-21 DIAGNOSIS — I130 Hypertensive heart and chronic kidney disease with heart failure and stage 1 through stage 4 chronic kidney disease, or unspecified chronic kidney disease: Secondary | ICD-10-CM | POA: Diagnosis not present

## 2017-11-21 DIAGNOSIS — N009 Acute nephritic syndrome with unspecified morphologic changes: Secondary | ICD-10-CM | POA: Diagnosis not present

## 2017-11-21 DIAGNOSIS — N049 Nephrotic syndrome with unspecified morphologic changes: Secondary | ICD-10-CM | POA: Diagnosis not present

## 2017-11-21 DIAGNOSIS — E1122 Type 2 diabetes mellitus with diabetic chronic kidney disease: Secondary | ICD-10-CM | POA: Diagnosis not present

## 2017-11-21 DIAGNOSIS — M310 Hypersensitivity angiitis: Secondary | ICD-10-CM | POA: Diagnosis not present

## 2017-11-21 DIAGNOSIS — R140 Abdominal distension (gaseous): Secondary | ICD-10-CM | POA: Diagnosis not present

## 2017-11-21 DIAGNOSIS — R042 Hemoptysis: Secondary | ICD-10-CM | POA: Diagnosis not present

## 2017-11-21 DIAGNOSIS — E1121 Type 2 diabetes mellitus with diabetic nephropathy: Secondary | ICD-10-CM | POA: Diagnosis not present

## 2017-11-21 DIAGNOSIS — E1165 Type 2 diabetes mellitus with hyperglycemia: Secondary | ICD-10-CM | POA: Diagnosis not present

## 2017-11-21 DIAGNOSIS — Z794 Long term (current) use of insulin: Secondary | ICD-10-CM | POA: Diagnosis not present

## 2017-11-21 DIAGNOSIS — N179 Acute kidney failure, unspecified: Secondary | ICD-10-CM | POA: Diagnosis not present

## 2017-11-21 DIAGNOSIS — D61818 Other pancytopenia: Secondary | ICD-10-CM | POA: Diagnosis not present

## 2017-11-21 DIAGNOSIS — K7460 Unspecified cirrhosis of liver: Secondary | ICD-10-CM | POA: Diagnosis not present

## 2017-11-21 DIAGNOSIS — N184 Chronic kidney disease, stage 4 (severe): Secondary | ICD-10-CM | POA: Diagnosis not present

## 2017-11-21 DIAGNOSIS — T380X5A Adverse effect of glucocorticoids and synthetic analogues, initial encounter: Secondary | ICD-10-CM | POA: Diagnosis not present

## 2017-11-21 DIAGNOSIS — I129 Hypertensive chronic kidney disease with stage 1 through stage 4 chronic kidney disease, or unspecified chronic kidney disease: Secondary | ICD-10-CM | POA: Diagnosis not present

## 2017-11-21 LAB — CBC
HEMATOCRIT: 23.7 % — AB (ref 39.0–52.0)
Hemoglobin: 7.6 g/dL — ABNORMAL LOW (ref 13.0–17.0)
MCH: 24.3 pg — AB (ref 26.0–34.0)
MCHC: 32.1 g/dL (ref 30.0–36.0)
MCV: 75.7 fL — AB (ref 78.0–100.0)
Platelets: 42 10*3/uL — ABNORMAL LOW (ref 150–400)
RBC: 3.13 MIL/uL — ABNORMAL LOW (ref 4.22–5.81)
RDW: 17.6 % — ABNORMAL HIGH (ref 11.5–15.5)
WBC: 4.6 10*3/uL (ref 4.0–10.5)

## 2017-11-21 LAB — THERAPEUTIC PLASMA EXCHANGE (BLOOD BANK)
Plasma volume needed: 3000
UNIT DIVISION: 0
UNIT DIVISION: 0
UNIT DIVISION: 0
UNIT DIVISION: 0
UNIT DIVISION: 0
Unit division: 0
Unit division: 0
Unit division: 0
Unit division: 0
Unit division: 0

## 2017-11-21 LAB — BASIC METABOLIC PANEL
ANION GAP: 12 (ref 5–15)
BUN: 86 mg/dL — ABNORMAL HIGH (ref 6–20)
CALCIUM: 7.5 mg/dL — AB (ref 8.9–10.3)
CO2: 22 mmol/L (ref 22–32)
Chloride: 108 mmol/L (ref 101–111)
Creatinine, Ser: 3.57 mg/dL — ABNORMAL HIGH (ref 0.61–1.24)
GFR calc Af Amer: 19 mL/min — ABNORMAL LOW (ref >60)
GFR calc non Af Amer: 16 mL/min — ABNORMAL LOW (ref >60)
GLUCOSE: 230 mg/dL — AB (ref 65–99)
Potassium: 3.7 mmol/L (ref 3.5–5.1)
Sodium: 142 mmol/L (ref 135–145)

## 2017-11-21 LAB — POCT I-STAT, CHEM 8
BUN: 71 mg/dL — AB (ref 6–20)
CHLORIDE: 102 mmol/L (ref 101–111)
CREATININE: 3.8 mg/dL — AB (ref 0.61–1.24)
Calcium, Ion: 0.69 mmol/L — CL (ref 1.15–1.40)
GLUCOSE: 355 mg/dL — AB (ref 65–99)
HCT: 24 % — ABNORMAL LOW (ref 39.0–52.0)
Hemoglobin: 8.2 g/dL — ABNORMAL LOW (ref 13.0–17.0)
Potassium: 3.7 mmol/L (ref 3.5–5.1)
Sodium: 145 mmol/L (ref 135–145)
TCO2: 21 mmol/L — ABNORMAL LOW (ref 22–32)

## 2017-11-21 LAB — BPAM FFP
Blood Product Expiration Date: 201904202359
Blood Product Expiration Date: 201904202359
ISSUE DATE / TIME: 201904181039
ISSUE DATE / TIME: 201904181039
UNIT TYPE AND RH: 6200
UNIT TYPE AND RH: 6200

## 2017-11-21 LAB — PREPARE FRESH FROZEN PLASMA
UNIT DIVISION: 0
UNIT DIVISION: 0

## 2017-11-21 LAB — GLUCOSE, CAPILLARY
Glucose-Capillary: 289 mg/dL — ABNORMAL HIGH (ref 65–99)
Glucose-Capillary: 370 mg/dL — ABNORMAL HIGH (ref 65–99)
Glucose-Capillary: 352 mg/dL — ABNORMAL HIGH (ref 65–99)
Glucose-Capillary: 158 mg/dL — ABNORMAL HIGH (ref 65–99)

## 2017-11-21 MED ORDER — CALCIUM CARBONATE ANTACID 500 MG PO CHEW
CHEWABLE_TABLET | ORAL | Status: AC
  Administered 2017-11-21: 09:00:00
  Filled 2017-11-21: qty 2

## 2017-11-21 MED ORDER — LOPERAMIDE HCL 2 MG PO CAPS
2.0000 mg | ORAL_CAPSULE | ORAL | Status: DC | PRN
  Administered 2017-11-21 – 2017-11-26 (×4): 2 mg via ORAL
  Filled 2017-11-21 (×4): qty 1

## 2017-11-21 MED ORDER — ACETAMINOPHEN 325 MG PO TABS: 650.0000 mg | ORAL_TABLET | ORAL | Status: DC | PRN

## 2017-11-21 MED ORDER — CALCIUM CARBONATE ANTACID 500 MG PO CHEW
2.0000 | CHEWABLE_TABLET | ORAL | Status: AC
  Administered 2017-11-21 (×2): 400 mg via ORAL
  Filled 2017-11-21 (×2): qty 2

## 2017-11-21 MED ORDER — ACD FORMULA A 0.73-2.45-2.2 GM/100ML VI SOLN
500.0000 mL | Status: DC
  Administered 2017-11-22: 500 mL via INTRAVENOUS
  Filled 2017-11-21: qty 500

## 2017-11-21 MED ORDER — SODIUM CHLORIDE 0.9 % IV SOLN
2.0000 g | INTRAVENOUS | Status: AC
  Administered 2017-11-21: 2 g via INTRAVENOUS
  Filled 2017-11-21: qty 20

## 2017-11-21 MED ORDER — DIPHENHYDRAMINE HCL 25 MG PO CAPS: 25.0000 mg | ORAL_CAPSULE | Freq: Four times a day (QID) | ORAL | Status: DC | PRN

## 2017-11-21 MED ORDER — SODIUM CHLORIDE 0.9 % IV SOLN
Freq: Once | INTRAVENOUS | Status: AC
  Administered 2017-11-21: 13:00:00 via INTRAVENOUS

## 2017-11-21 MED ORDER — ANTICOAGULANT SODIUM CITRATE 4% (200MG/5ML) IV SOLN
5.0000 mL | Status: AC
  Administered 2017-11-21: 5 mL
  Filled 2017-11-21: qty 250

## 2017-11-21 MED ORDER — ACD FORMULA A 0.73-2.45-2.2 GM/100ML VI SOLN
Status: AC
  Filled 2017-11-21: qty 500

## 2017-11-21 NOTE — Progress Notes (Signed)
PROGRESS NOTE    William [name]  XQJ:[id number] DOB: 10-26-1950 DOA: 11/19/2017 PCP: Gildardo Pounds, NP   Brief Narrative:   45 male with history of CKD stage IV, cirrhosis, diabetes mellitus type 2, diastolic congestive heart failure who was recently diagnosed with Goodpasture's disease with outpatient plasmapheresis.  He was hospitalized here from November 06, 2017 to November 18, 2017 where he was treated with Solu-Medrol, plasmapheresis and dialysis was initiated.  Renal biopsy was performed on November 17, 2017 and was discharged on Cytoxan.  During his hospitalization he also received multiple units of transfusion.  After going home patient had episodes of hemoptysis therefore returned to the hospital for further evaluation.  Upon admission his hemoglobin was noted to be 9.1, platelets were down to 47, BUN 87 and creatinine of 3.7.  Assessment & Plan:   Principal Problem:   Goodpasture's syndrome ([place]) with associated hemoptysis Active Problems:   Type II diabetes mellitus with renal manifestations (HCC)   Pancytopenia (HCC)   Acute renal failure superimposed on stage 4 chronic kidney disease (HCC)   Essential hypertension   Hemoptysis   Asymmetric edema of both lower extremities   Metabolic acidosis  Hemoptysis with normocytic anemia, improved  Goodpasture's Disease with anti GBM -Nephrology following, appreciate input. Case Discussed with Dr Hollie Salk- prelim report shows Diabetic GN with secondary FSGS but not enough sample for IF staining.  -Cont Solumedrol 1g daily for 3 days, D2. Then Prednisone 28m po daily for 1 month -Cont Cytoxan 73mpo daily.  -Cont daily Plamaphoresis and tx as needed.  -Eventually will decide if patient would need another renal Bx vs Bronch   Pancytopenia -Getting Tx- 2 U FFP and 1 U Plt.  -Monitor Hb closely and transfuse if needed. On iron Supplements  CKD Stage III-IV -Cr appearing to his around his baseline of 3.7. Will continue to monitor and avoid  nephrotoxic drugs.   Diabetes mellitus type 2 with Renal manifestations  -Cont Lantus 10units daily, novolog 5units premeals, and ISS  Essential HTN -Cont Coreg and Norvasc 1074mo daily.   B/L LE Swelling R>L -LE dopplers are ordered by Dr Rama   History Of Cirrhosis.  -Supportive care   DVT prophylaxis: SCDs Code Status: Full  Family Communication:  None at bedside  Disposition Plan: TBD  Consultants:   Nephro  Procedures:   None  Antimicrobials:   None   Subjective: No complaints. Not sure why he didn't get tx yesterday.  Hemoptysis has improved a little.   Review of Systems Otherwise negative except as per HPI, including: General: Denies fever, chills, night sweats or unintended weight loss. Resp: Denies cough, wheezing, shortness of breath. Cardiac: Denies chest pain, palpitations, orthopnea, paroxysmal nocturnal dyspnea. GI: Denies abdominal pain, nausea, vomiting, diarrhea or constipation GU: Denies dysuria, frequency, hesitancy or incontinence MS: Denies muscle aches, joint pain or swelling Neuro: Denies headache, neurologic deficits (focal weakness, numbness, tingling), abnormal gait Psych: Denies anxiety, depression, SI/HI/AVH Skin: Denies new rashes or lesions ID: Denies sick contacts, exotic exposures, travel  Objective: Vitals:   11/21/17 1151 11/21/17 1259 11/21/17 1303 11/21/17 1321  BP: (!) 162/77 (!) 169/72  (!) 147/70  Pulse:      Resp: (!) 22  15 14   Temp: 98.2 F (36.8 C) 98.7 F (37.1 C)  98.2 F (36.8 C)  TempSrc: Oral Oral  Oral  SpO2: 95% 90%  91%  Weight:      Height:        Intake/Output Summary (Last  24 hours) at 11/21/2017 1336 Last data filed at 11/21/2017 0730 Gross per 24 hour  Intake 980 ml  Output -  Net 980 ml   Filed Weights   11/20/17 1716 11/21/17 0640  Weight: 67 kg (147 lb 11.3 oz) 66.2 kg (145 lb 15.1 oz)    Examination:  General exam: Appears calm and comfortable  Respiratory system: bibasilar  crackles.  Cardiovascular system: S1 & S2 heard, RRR. No JVD, murmurs, rubs, gallops or clicks. No pedal edema. Gastrointestinal system: Abdomen is nondistended, soft and nontender. No organomegaly or masses felt. Normal bowel sounds heard. Central nervous system: Alert and oriented. No focal neurological deficits. Extremities: Symmetric 5 x 5 power. 1+ LE edema R>L Skin: No rashes, lesions or ulcers Psychiatry: Judgement and insight appear normal. Mood & affect appropriate.     Data Reviewed:   CBC: Recent Labs  Lab 11/17/17 0301  11/18/17 0219  11/19/17 1937 11/20/17 1404 11/20/17 1516 11/21/17 0306 11/21/17 1000  WBC 3.4*  --  3.2*  --  6.5  --  6.5 4.6  --   NEUTROABS 2.7  --  2.6  --   --   --  5.7  --   --   HGB 9.8*   < > 9.0*   < > 9.1* 9.2* 8.6* 7.6* 8.2*  HCT 29.7*   < > 27.7*   < > 27.2* 27.0* 25.9* 23.7* 24.0*  MCV 75.8*  --  75.7*  --  76.6*  --  75.7* 75.7*  --   PLT 64*  --  63*  --  47*  --  47* 42*  --    < > = values in this interval not displayed.   Basic Metabolic Panel: Recent Labs  Lab 11/15/17 0258  11/16/17 0158 11/17/17 0301  11/18/17 0219  11/19/17 1937 11/20/17 1404 11/20/17 1516 11/21/17 0306 11/21/17 1000  NA 142   < > 141 141   < > 142   < > 139 144 142 142 145  K 4.1   < > 4.0 3.9   < > 3.9   < > 4.3 3.9 4.0 3.7 3.7  CL 109   < > 110 107   < > 111   < > 109 104 109 108 102  CO2 21*  --  21* 22  --  22  --  17*  --  20* 22  --   GLUCOSE 155*   < > 237* 153*   < > 136*   < > 429* 354* 364* 230* 355*  BUN 82*   < > 78* 81*   < > 73*   < > 87* 72* 92* 86* 71*  CREATININE 3.54*   < > 3.34* 3.33*   < > 3.38*   < > 3.71* 3.70* 3.81* 3.57* 3.80*  CALCIUM 7.6*  --  7.5* 7.9*  --  7.5*  --  7.3*  --  7.8* 7.5*  --   PHOS 4.5  --  4.4 4.4  --  4.9*  --   --   --   --   --   --    < > = values in this interval not displayed.   GFR: Estimated Creatinine Clearance: 16.6 mL/min (A) (by C-G formula based on SCr of 3.8 mg/dL (H)). Liver Function  Tests: Recent Labs  Lab 11/15/17 0258 11/16/17 0158 11/17/17 0301 11/18/17 0219 11/20/17 1516  AST  --   --   --   --  16  ALT  --   --   --   --  9*  ALKPHOS  --   --   --   --  38  BILITOT  --   --   --   --  2.7*  PROT  --   --   --   --  5.1*  ALBUMIN 4.4 4.5 4.2 4.0 4.4   No results for input(s): LIPASE, AMYLASE in the last 168 hours. No results for input(s): AMMONIA in the last 168 hours. Coagulation Profile: Recent Labs  Lab 11/17/17 0301  INR 1.75   Cardiac Enzymes: No results for input(s): CKTOTAL, CKMB, CKMBINDEX, TROPONINI in the last 168 hours. BNP (last 3 results) Recent Labs    09/25/17 1614  PROBNP 380.0*   HbA1C: No results for input(s): HGBA1C in the last 72 hours. CBG: Recent Labs  Lab 11/20/17 0652 11/20/17 1754 11/20/17 2152 11/21/17 0733 11/21/17 1330  GLUCAP 366* 363* 375* 289* 370*   Lipid Profile: No results for input(s): CHOL, HDL, LDLCALC, TRIG, CHOLHDL, LDLDIRECT in the last 72 hours. Thyroid Function Tests: No results for input(s): TSH, T4TOTAL, FREET4, T3FREE, THYROIDAB in the last 72 hours. Anemia Panel: No results for input(s): VITAMINB12, FOLATE, FERRITIN, TIBC, IRON, RETICCTPCT in the last 72 hours. Sepsis Labs: No results for input(s): PROCALCITON, LATICACIDVEN in the last 168 hours.  No results found for this or any previous visit (from the past 240 hour(s)).       Radiology Studies: Dg Chest 2 View  Result Date: 11/19/2017 CLINICAL DATA:  Chest pain EXAM: CHEST - 2 VIEW COMPARISON:  11/11/2017 FINDINGS: Bilateral airspace disease with interval improvement. No effusion. Dual lumen right jugular central venous catheter tip in the SVC unchanged IMPRESSION: Interval improvement in bilateral airspace disease. Electronically Signed   By: Franchot Gallo M.D.   On: 11/19/2017 20:48   Ct Chest Wo Contrast  Result Date: 11/20/2017 CLINICAL DATA:  Hemoptysis EXAM: CT CHEST WITHOUT CONTRAST TECHNIQUE: Multidetector CT imaging of  the chest was performed following the standard protocol without IV contrast. COMPARISON:  11/19/2017 chest x-ray.  Chest CT 11/07/2017 FINDINGS: Cardiovascular: Scattered coronary artery and aortic calcifications. Cardiomegaly. No evidence of aortic aneurysm. Small pericardial effusion. Mediastinum/Nodes: Scattered small mediastinal lymph nodes, likely reactive. No mediastinal, hilar, or axillary adenopathy. Lungs/Pleura: Diffuse bilateral airspace opacities, similar to prior CT. Trace left pleural effusion. Bronchial wall thickening noted in the lower lobes, stable. Upper Abdomen: Changes of cirrhosis. Spleen appears enlarged but is incompletely visualized. Musculoskeletal: Chest wall soft tissues are unremarkable. No acute bony abnormality. IMPRESSION: Diffuse bilateral airspace disease, most confluent in the lower lobes but also noted in the upper lobes. Findings are similar to prior CT. Bronchial wall thickening in the lower lobes. Findings could reflect edema, hemorrhage, or infection. Small left pleural effusion and pericardial effusion. Cardiomegaly. Coronary artery disease, aortic atherosclerosis. Changes of cirrhosis. Suspected splenomegaly although the spleen is not imaged in its entirety. Electronically Signed   By: Rolm Baptise M.D.   On: 11/20/2017 10:43        Scheduled Meds: . amLODipine  10 mg Oral Daily  . carvedilol  12.5 mg Oral BID WC  . cyclophosphamide  75 mg Oral Daily  . ferrous gluconate  324 mg Oral Daily  . furosemide  40 mg Oral BID  . insulin aspart  0-5 Units Subcutaneous QHS  . insulin aspart  0-9 Units Subcutaneous TID WC  . insulin glargine  10 Units Subcutaneous [id number]  .  multivitamin with minerals  1 tablet Oral Daily  . pantoprazole  40 mg Oral Daily  . [START ON 11/23/2017] predniSONE  60 mg Oral Q breakfast  . sodium bicarbonate  1,300 mg Oral BID   Continuous Infusions: . sodium chloride    . sodium chloride    . anticoagulant sodium citrate    . citrate  dextrose    . citrate dextrose 500 mL (11/20/17 1447)  . citrate dextrose    . methylPREDNISolone (SOLU-MEDROL) injection       LOS: 0 days    Time spent: 35 mins    Mouhamed Glassco Arsenio Loader, MD [facility] Pager [phone]   If 7PM-7AM, please contact night-coverage [email or web address] Password [email or web address] 11/21/2017, 1:36 PM

## 2017-11-21 NOTE — Progress Notes (Signed)
Patient is off the unit in dialysis

## 2017-11-21 NOTE — Progress Notes (Signed)
Results for MERIT, MAYBEE (MRN [id number]) as of 11/21/2017 12:51  Ref. Range 11/20/2017 06:52 11/20/2017 17:54 11/20/2017 21:52 11/21/2017 07:33  Glucose-Capillary Latest Ref Range: 65 - 99 mg/dL 366 (H) 363 (H) 375 (H) 289 (H)  Noted that blood sugars continue to be greater than 200 mg/dl. Recommend increasing Lantus to 18 units daily.  ( this dose is equal to what patient takes of 70/30 18 units BID at home)  Will continue to monitor blood sugars while in the hospital.  Harvel Ricks RN BSN CDE Diabetes Coordinator Pager: [phone]  8am-5pm

## 2017-11-21 NOTE — Progress Notes (Signed)
[facility] Progress Note    Assessment/ Plan:   1.  Pulmonary-renal syndrome, anti-GBM: Continue daily PLEX with FFP until hemoptysis resolves and Hgb stable.  The kidney biopsy from 4/15 prelim report showed diabetic GN with secondary FSGS and not enough glomeruli to undergo IF staining for anti-GBM antibodies.  So- he will either need to have another biopsy when plts are acceptable or will need to get pulm input for bronch/ biopsy there.  Each procedure is not without risk so should be carefully considered.  I'm re-pulsing him with IV steroids, 1 g IV solumedrol x 3 days (today is day #2 of 3) and then back to prednisone 60 mg daily.  Continue cytoxan 75 mg daily (1 mg/ kg roughly).  There are case reports of using rituxan for anti-GBM but they are not evidenced based and have not been studied in pulm hemorrhage.  I'll draw anti-GBM antibodies tomorrow.  2.  Thrombocytopenia- plt transfusion re-ordered for today  3.  Anemia- secondary to hemoptysis, will closely monitor CBC  4.  Hemoptysis: CT chest with GGO consistent with pulm hemorrhage- continue to monitor, not on O2, appears to be slowing down for now  5.  Cirrhosis: had to get Neupogen before discharge (received 4/16)   Subjective:    Got TPE yesterday and again for today with all FFP replacement.  Pt and son report hemoptysis is better, Hgb down to 7.6.  Platelet infusion not given yesterday, plts 42 this AM.     Objective:   BP (!) 147/70   Pulse 85   Temp 98.2 F (36.8 C) (Oral)   Resp 15   Ht 5' 5"  (1.651 m)   Wt 66.2 kg (145 lb 15.1 oz)   SpO2 91%   BMI 24.29 kg/m   Intake/Output Summary (Last 24 hours) at 11/21/2017 1323 Last data filed at 11/21/2017 0730 Gross per 24 hour  Intake 980 ml  Output -  Net 980 ml   Weight change:   Physical Exam: Gen: NAD, sitting in bed CVS:RRR Resp: bilateral crackles improved but still present Abd: distention Ext: trace LE edema  Imaging: Dg Chest 2  View  Result Date: 11/19/2017 CLINICAL DATA:  Chest pain EXAM: CHEST - 2 VIEW COMPARISON:  11/11/2017 FINDINGS: Bilateral airspace disease with interval improvement. No effusion. Dual lumen right jugular central venous catheter tip in the SVC unchanged IMPRESSION: Interval improvement in bilateral airspace disease. Electronically Signed   By: Franchot Gallo M.D.   On: 11/19/2017 20:48   Ct Chest Wo Contrast  Result Date: 11/20/2017 CLINICAL DATA:  Hemoptysis EXAM: CT CHEST WITHOUT CONTRAST TECHNIQUE: Multidetector CT imaging of the chest was performed following the standard protocol without IV contrast. COMPARISON:  11/19/2017 chest x-ray.  Chest CT 11/07/2017 FINDINGS: Cardiovascular: Scattered coronary artery and aortic calcifications. Cardiomegaly. No evidence of aortic aneurysm. Small pericardial effusion. Mediastinum/Nodes: Scattered small mediastinal lymph nodes, likely reactive. No mediastinal, hilar, or axillary adenopathy. Lungs/Pleura: Diffuse bilateral airspace opacities, similar to prior CT. Trace left pleural effusion. Bronchial wall thickening noted in the lower lobes, stable. Upper Abdomen: Changes of cirrhosis. Spleen appears enlarged but is incompletely visualized. Musculoskeletal: Chest wall soft tissues are unremarkable. No acute bony abnormality. IMPRESSION: Diffuse bilateral airspace disease, most confluent in the lower lobes but also noted in the upper lobes. Findings are similar to prior CT. Bronchial wall thickening in the lower lobes. Findings could reflect edema, hemorrhage, or infection. Small left pleural effusion and pericardial effusion. Cardiomegaly. Coronary artery disease, aortic atherosclerosis.  Changes of cirrhosis. Suspected splenomegaly although the spleen is not imaged in its entirety. Electronically Signed   By: Rolm Baptise M.D.   On: 11/20/2017 10:43    Labs: BMET Recent Labs  Lab 11/15/17 0258  11/16/17 0158 11/17/17 0301  11/18/17 0219 11/19/17 0849  11/19/17 1937 11/20/17 1404 11/20/17 1516 11/21/17 0306 11/21/17 1000  NA 142   < > 141 141   < > 142 140 139 144 142 142 145  K 4.1   < > 4.0 3.9   < > 3.9 4.5 4.3 3.9 4.0 3.7 3.7  CL 109   < > 110 107   < > 111 103 109 104 109 108 102  CO2 21*  --  21* 22  --  22  --  17*  --  20* 22  --   GLUCOSE 155*   < > 237* 153*   < > 136* 343* 429* 354* 364* 230* 355*  BUN 82*   < > 78* 81*   < > 73* 70* 87* 72* 92* 86* 71*  CREATININE 3.54*   < > 3.34* 3.33*   < > 3.38* 3.60* 3.71* 3.70* 3.81* 3.57* 3.80*  CALCIUM 7.6*  --  7.5* 7.9*  --  7.5*  --  7.3*  --  7.8* 7.5*  --   PHOS 4.5  --  4.4 4.4  --  4.9*  --   --   --   --   --   --    < > = values in this interval not displayed.   CBC Recent Labs  Lab 11/17/17 0301  11/18/17 0219  11/19/17 1937 11/20/17 1404 11/20/17 1516 11/21/17 0306 11/21/17 1000  WBC 3.4*  --  3.2*  --  6.5  --  6.5 4.6  --   NEUTROABS 2.7  --  2.6  --   --   --  5.7  --   --   HGB 9.8*   < > 9.0*   < > 9.1* 9.2* 8.6* 7.6* 8.2*  HCT 29.7*   < > 27.7*   < > 27.2* 27.0* 25.9* 23.7* 24.0*  MCV 75.8*  --  75.7*  --  76.6*  --  75.7* 75.7*  --   PLT 64*  --  63*  --  47*  --  47* 42*  --    < > = values in this interval not displayed.    Medications:    . amLODipine  10 mg Oral Daily  . carvedilol  12.5 mg Oral BID WC  . cyclophosphamide  75 mg Oral Daily  . ferrous gluconate  324 mg Oral Daily  . furosemide  40 mg Oral BID  . insulin aspart  0-5 Units Subcutaneous QHS  . insulin aspart  0-9 Units Subcutaneous TID WC  . insulin glargine  10 Units Subcutaneous BH-q7a  . multivitamin with minerals  1 tablet Oral Daily  . pantoprazole  40 mg Oral Daily  . [START ON 11/23/2017] predniSONE  60 mg Oral Q breakfast  . sodium bicarbonate  1,300 mg Oral BID      Madelon Lips, MD [place] pgr [phone] 11/21/2017, 1:23 PM

## 2017-11-22 ENCOUNTER — Inpatient Hospital Stay (HOSPITAL_COMMUNITY): Payer: Medicare Other

## 2017-11-22 ENCOUNTER — Inpatient Hospital Stay (HOSPITAL_COMMUNITY): Payer: Medicaid Other

## 2017-11-22 DIAGNOSIS — N184 Chronic kidney disease, stage 4 (severe): Secondary | ICD-10-CM | POA: Diagnosis not present

## 2017-11-22 DIAGNOSIS — E1122 Type 2 diabetes mellitus with diabetic chronic kidney disease: Secondary | ICD-10-CM | POA: Diagnosis not present

## 2017-11-22 DIAGNOSIS — R042 Hemoptysis: Secondary | ICD-10-CM | POA: Diagnosis not present

## 2017-11-22 DIAGNOSIS — I130 Hypertensive heart and chronic kidney disease with heart failure and stage 1 through stage 4 chronic kidney disease, or unspecified chronic kidney disease: Secondary | ICD-10-CM | POA: Diagnosis not present

## 2017-11-22 DIAGNOSIS — I5032 Chronic diastolic (congestive) heart failure: Secondary | ICD-10-CM | POA: Diagnosis not present

## 2017-11-22 DIAGNOSIS — N049 Nephrotic syndrome with unspecified morphologic changes: Secondary | ICD-10-CM | POA: Diagnosis not present

## 2017-11-22 DIAGNOSIS — N009 Acute nephritic syndrome with unspecified morphologic changes: Secondary | ICD-10-CM | POA: Diagnosis not present

## 2017-11-22 DIAGNOSIS — M310 Hypersensitivity angiitis: Secondary | ICD-10-CM | POA: Diagnosis not present

## 2017-11-22 DIAGNOSIS — N179 Acute kidney failure, unspecified: Secondary | ICD-10-CM | POA: Diagnosis not present

## 2017-11-22 DIAGNOSIS — I129 Hypertensive chronic kidney disease with stage 1 through stage 4 chronic kidney disease, or unspecified chronic kidney disease: Secondary | ICD-10-CM | POA: Diagnosis not present

## 2017-11-22 LAB — THERAPEUTIC PLASMA EXCHANGE (BLOOD BANK)
PLASMA VOLUME NEEDED: 3100
UNIT DIVISION: 0
UNIT DIVISION: 0
UNIT DIVISION: 0
UNIT DIVISION: 0
UNIT DIVISION: 0
UNIT DIVISION: 0
Unit division: 0
Unit division: 0
Unit division: 0
Unit division: 0
Unit division: 0

## 2017-11-22 LAB — GLUCOSE, CAPILLARY
GLUCOSE-CAPILLARY: 416 mg/dL — AB (ref 65–99)
Glucose-Capillary: 326 mg/dL — ABNORMAL HIGH (ref 65–99)
Glucose-Capillary: 429 mg/dL — ABNORMAL HIGH (ref 65–99)
Glucose-Capillary: 429 mg/dL — ABNORMAL HIGH (ref 65–99)
Glucose-Capillary: 350 mg/dL — ABNORMAL HIGH (ref 65–99)

## 2017-11-22 LAB — BASIC METABOLIC PANEL
ANION GAP: 13 (ref 5–15)
BUN: 87 mg/dL — ABNORMAL HIGH (ref 6–20)
CALCIUM: 7.5 mg/dL — AB (ref 8.9–10.3)
CO2: 23 mmol/L (ref 22–32)
Chloride: 101 mmol/L (ref 101–111)
Creatinine, Ser: 3.77 mg/dL — ABNORMAL HIGH (ref 0.61–1.24)
GFR calc Af Amer: 18 mL/min — ABNORMAL LOW (ref >60)
GFR calc non Af Amer: 15 mL/min — ABNORMAL LOW (ref >60)
Glucose, Bld: 363 mg/dL — ABNORMAL HIGH (ref 65–99)
Potassium: 4.7 mmol/L (ref 3.5–5.1)
Sodium: 137 mmol/L (ref 135–145)

## 2017-11-22 LAB — CBC
HCT: 24 % — ABNORMAL LOW (ref 39.0–52.0)
HEMOGLOBIN: 7.9 g/dL — AB (ref 13.0–17.0)
MCH: 25.3 pg — AB (ref 26.0–34.0)
MCHC: 32.9 g/dL (ref 30.0–36.0)
MCV: 76.9 fL — AB (ref 78.0–100.0)
Platelets: 66 10*3/uL — ABNORMAL LOW (ref 150–400)
RBC: 3.12 MIL/uL — ABNORMAL LOW (ref 4.22–5.81)
RDW: 17.9 % — ABNORMAL HIGH (ref 11.5–15.5)
WBC: 5.1 10*3/uL (ref 4.0–10.5)

## 2017-11-22 LAB — BPAM PLATELET PHERESIS
BLOOD PRODUCT EXPIRATION DATE: 201904212359
BLOOD PRODUCT EXPIRATION DATE: 201904212359
ISSUE DATE / TIME: 201904191251
ISSUE DATE / TIME: 201904191251
Unit Type and Rh: 6200
Unit Type and Rh: 6200

## 2017-11-22 LAB — PREPARE PLATELET PHERESIS
UNIT DIVISION: 0
Unit division: 0

## 2017-11-22 MED ORDER — SODIUM CHLORIDE 0.9 % IV SOLN
2.0000 g | INTRAVENOUS | Status: AC
  Administered 2017-11-22: 2 g via INTRAVENOUS
  Filled 2017-11-22: qty 20

## 2017-11-22 MED ORDER — DIPHENHYDRAMINE HCL 25 MG PO CAPS
25.0000 mg | ORAL_CAPSULE | Freq: Four times a day (QID) | ORAL | Status: DC | PRN
  Administered 2017-11-24: 25 mg via ORAL

## 2017-11-22 MED ORDER — ACETAMINOPHEN 325 MG PO TABS
650.0000 mg | ORAL_TABLET | ORAL | Status: DC | PRN
  Administered 2017-11-24: 650 mg via ORAL

## 2017-11-22 MED ORDER — INSULIN GLARGINE 100 UNIT/ML ~~LOC~~ SOLN
5.0000 [IU] | Freq: Once | SUBCUTANEOUS | Status: AC
  Administered 2017-11-22: 5 [IU] via SUBCUTANEOUS
  Filled 2017-11-22: qty 0.05

## 2017-11-22 MED ORDER — INSULIN ASPART 100 UNIT/ML ~~LOC~~ SOLN
12.0000 [IU] | Freq: Once | SUBCUTANEOUS | Status: AC
  Administered 2017-11-22: 12 [IU] via SUBCUTANEOUS

## 2017-11-22 MED ORDER — ACD FORMULA A 0.73-2.45-2.2 GM/100ML VI SOLN: 500.0000 mL | Status: DC

## 2017-11-22 MED ORDER — CALCIUM CARBONATE ANTACID 500 MG PO CHEW
CHEWABLE_TABLET | ORAL | Status: AC
  Administered 2017-11-22: 400 mg via ORAL
  Filled 2017-11-22: qty 2

## 2017-11-22 MED ORDER — INSULIN GLARGINE 100 UNIT/ML ~~LOC~~ SOLN
15.0000 [IU] | SUBCUTANEOUS | Status: DC
  Administered 2017-11-23: 15 [IU] via SUBCUTANEOUS
  Filled 2017-11-22: qty 0.15

## 2017-11-22 MED ORDER — ACD FORMULA A 0.73-2.45-2.2 GM/100ML VI SOLN
Status: AC
  Administered 2017-11-22: 500 mL via INTRAVENOUS
  Filled 2017-11-22: qty 500

## 2017-11-22 MED ORDER — ANTICOAGULANT SODIUM CITRATE 4% (200MG/5ML) IV SOLN
5.0000 mL | Status: AC
  Filled 2017-11-22: qty 250

## 2017-11-22 MED ORDER — CALCIUM CARBONATE ANTACID 500 MG PO CHEW
2.0000 | CHEWABLE_TABLET | ORAL | Status: AC
  Administered 2017-11-22: 400 mg via ORAL

## 2017-11-22 NOTE — Progress Notes (Addendum)
[facility] Progress Note    Assessment/ Plan:   1.  Pulmonary-renal syndrome, anti-GBM: Continue daily PLEX with FFP until hemoptysis resolves and Hgb stable (started 4/18).  The kidney biopsy from 4/15 prelim report showed diabetic GN with secondary FSGS and not enough glomeruli to undergo IF staining for anti-GBM antibodies.  So- he will either need to have another biopsy when plts are acceptable or will need to get pulm input for bronch/ biopsy there.  Each procedure is not without risk so should be carefully considered.  I'm re-pulsing him with IV steroids, 1 g IV solumedrol x 3 days (today is day #3 of 3) and then back to prednisone 60 mg daily.  Continue cytoxan 75 mg daily (1 mg/ kg roughly).  There are case reports of using rituxan for anti-GBM but they are not evidenced based and have not been studied in pulm hemorrhage.  I'll draw anti-GBM antibodies today.  2.  Thrombocytopenia- plt transfusion 4/19  3.  Anemia- secondary to hemoptysis, will closely monitor CBC, stable over the past 12 hours at around 8  4.  Hemoptysis: CT chest with GGO consistent with pulm hemorrhage- continue to monitor, not on O2, appears to be slowing down for now  5.  Cirrhosis: had to get Neupogen before discharge (received 4/16).  On Lasix 40 PO BID   Subjective:    TPE planned for today as well.  Got plts.  Hgb stable.     Objective:   BP (!) 158/75 (BP Location: Left Arm)   Pulse 89   Temp 98.4 F (36.9 C) (Oral)   Resp 18   Ht 5' 5"  (1.651 m)   Wt 71.8 kg (158 lb 4.6 oz)   SpO2 90%   BMI 26.34 kg/m   Intake/Output Summary (Last 24 hours) at 11/22/2017 1147 Last data filed at 11/22/2017 0910 Gross per 24 hour  Intake 1163 ml  Output -  Net 1163 ml   Weight change: 4.8 kg (10 lb 9.3 oz)  Physical Exam: Gen: NAD, sitting in bed CVS:RRR Resp: bilateral crackles improved but still present Abd: distention, a little worse today Ext: trace LE edema  Imaging: No results  found.  Labs: BMET Recent Labs  Lab 11/16/17 0158 11/17/17 0301  11/18/17 0219 11/19/17 0849 11/19/17 1937 11/20/17 1404 11/20/17 1516 11/21/17 0306 11/21/17 1000 11/22/17 0805  NA 141 141   < > 142 140 139 144 142 142 145 137  K 4.0 3.9   < > 3.9 4.5 4.3 3.9 4.0 3.7 3.7 4.7  CL 110 107   < > 111 103 109 104 109 108 102 101  CO2 21* 22  --  22  --  17*  --  20* 22  --  23  GLUCOSE 237* 153*   < > 136* 343* 429* 354* 364* 230* 355* 363*  BUN 78* 81*   < > 73* 70* 87* 72* 92* 86* 71* 87*  CREATININE 3.34* 3.33*   < > 3.38* 3.60* 3.71* 3.70* 3.81* 3.57* 3.80* 3.77*  CALCIUM 7.5* 7.9*  --  7.5*  --  7.3*  --  7.8* 7.5*  --  7.5*  PHOS 4.4 4.4  --  4.9*  --   --   --   --   --   --   --    < > = values in this interval not displayed.   CBC Recent Labs  Lab 11/17/17 0301  11/18/17 0219  11/19/17 1937  11/20/17 1516  11/21/17 0306 11/21/17 1000 11/22/17 0805  WBC 3.4*  --  3.2*  --  6.5  --  6.5 4.6  --  5.1  NEUTROABS 2.7  --  2.6  --   --   --  5.7  --   --   --   HGB 9.8*   < > 9.0*   < > 9.1*   < > 8.6* 7.6* 8.2* 7.9*  HCT 29.7*   < > 27.7*   < > 27.2*   < > 25.9* 23.7* 24.0* 24.0*  MCV 75.8*  --  75.7*  --  76.6*  --  75.7* 75.7*  --  76.9*  PLT 64*  --  63*  --  47*  --  47* 42*  --  66*   < > = values in this interval not displayed.    Medications:    . amLODipine  10 mg Oral Daily  . calcium carbonate  2 tablet Oral Q3H  . carvedilol  12.5 mg Oral BID WC  . cyclophosphamide  75 mg Oral Daily  . ferrous gluconate  324 mg Oral Daily  . furosemide  40 mg Oral BID  . insulin aspart  0-5 Units Subcutaneous QHS  . insulin aspart  0-9 Units Subcutaneous TID WC  . [START ON 11/23/2017] insulin glargine  15 Units Subcutaneous BH-q7a  . insulin glargine  5 Units Subcutaneous Once  . multivitamin with minerals  1 tablet Oral Daily  . pantoprazole  40 mg Oral Daily  . [START ON 11/23/2017] predniSONE  60 mg Oral Q breakfast  . sodium bicarbonate  1,300 mg Oral BID       Madelon Lips, MD [facility] pgr [phone] 11/22/2017, 11:47 AM

## 2017-11-22 NOTE — Progress Notes (Signed)
PROGRESS NOTE    William [name]  VEL:[id number] DOB: [birth year] DOA: 11/19/2017 PCP: Gildardo Pounds, NP   Brief Narrative:   75 male with history of CKD stage IV, cirrhosis, diabetes mellitus type 2, diastolic congestive heart failure who was recently diagnosed with Goodpasture's disease with outpatient plasmapheresis.  He was hospitalized here from November 06, 2017 to November 18, 2017 where he was treated with Solu-Medrol, plasmapheresis and dialysis was initiated.  Renal biopsy was performed on November 17, 2017 and was discharged on Cytoxan.  During his hospitalization he also received multiple units of transfusion.  After going home patient had episodes of hemoptysis therefore returned to the hospital for further evaluation.  Upon admission his hemoglobin was noted to be 9.1, platelets were down to 47, BUN 87 and creatinine of 3.7.  Assessment & Plan:   Principal Problem:   Goodpasture's syndrome ([place]) with associated hemoptysis Active Problems:   Type II diabetes mellitus with renal manifestations (HCC)   Pancytopenia (HCC)   Acute renal failure superimposed on stage 4 chronic kidney disease (HCC)   Essential hypertension   Hemoptysis   Asymmetric edema of both lower extremities   Metabolic acidosis  Hemoptysis with normocytic anemia, improved  Goodpasture's Disease with anti GBM -Nephrology following, appreciate input. Case Discussed with Dr Hollie Salk- prelim report shows Diabetic GN with secondary FSGS but not enough sample for IF staining.  -Cont Solumedrol 1g daily for 3 days, D3. Then Prednisone 32m po daily for 1 month -Cont Cytoxan 774mpo daily.  -AntiGBM to be redrawn today -Cont daily Plamaphoresis and tx as needed.  -Eventually will decide if patient would need another renal Bx vs Bronch  -will get AXR for slightly distended abd.   Pancytopenia -s/p 2 U FFP and 1 U Plt.  -Monitor Hb closely and transfuse if needed. On iron Supplements -Hb and plt appears to be stable today.     CKD Stage III-IV -Cr appearing to his around his baseline of 3.7. Will continue to monitor and avoid nephrotoxic drugs.   Diabetes mellitus type 2 with Renal manifestations  Hyperglycemia -Cont Lantus 10units daily increased to 15 units, novolog 5units premeals, and ISS  Essential HTN -Cont Coreg and Norvasc 1034mo daily.   B/L LE Swelling R>L -LE dopplers are ordered by Dr Rama   History Of Cirrhosis.  -Supportive care   DVT prophylaxis: SCDs Code Status: Full  Family Communication:  None at bedside  Disposition Plan: TBD  Consultants:   Nephro  Procedures:   None  Antimicrobials:   None   Subjective: No complaints, resting in the HD unit.   Review of Systems Otherwise negative except as per HPI, including: General: Denies fever, chills, night sweats or unintended weight loss. Resp: Denies cough, wheezing, shortness of breath. Cardiac: Denies chest pain, palpitations, orthopnea, paroxysmal nocturnal dyspnea. GI: Denies abdominal pain, nausea, vomiting, diarrhea or constipation GU: Denies dysuria, frequency, hesitancy or incontinence MS: Denies muscle aches, joint pain or swelling Neuro: Denies headache, neurologic deficits (focal weakness, numbness, tingling), abnormal gait Psych: Denies anxiety, depression, SI/HI/AVH Skin: Denies new rashes or lesions ID: Denies sick contacts, exotic exposures, travel  Objective: Vitals:   11/22/17 0726 11/22/17 0752 11/22/17 1200 11/22/17 1230  BP:  (!) 158/75 (!) 154/73   Pulse:  89    Resp:  18    Temp: 98.4 F (36.9 C)  98.1 F (36.7 C) 98.1 F (36.7 C)  TempSrc: Oral  Oral   SpO2:  90%    Weight:  Height:        Intake/Output Summary (Last 24 hours) at 11/22/2017 1246 Last data filed at 11/22/2017 0910 Gross per 24 hour  Intake 1163 ml  Output -  Net 1163 ml   Filed Weights   11/20/17 1716 11/21/17 0640 11/22/17 0638  Weight: 67 kg (147 lb 11.3 oz) 66.2 kg (145 lb 15.1 oz) 71.8 kg (158 lb 4.6  oz)    Examination:  General exam: Appears calm and comfortable  Respiratory system: bibasilar crackles.  Cardiovascular system: S1 & S2 heard, RRR. No JVD, murmurs, rubs, gallops or clicks. No pedal edema. Gastrointestinal system: Abdomen is slightly distended, soft and nontender. No organomegaly or masses felt. Normal bowel sounds heard. Central nervous system: Alert and oriented. No focal neurological deficits. Extremities: Symmetric 5 x 5 power. 1+ LE edema R>L Skin: No rashes, lesions or ulcers Psychiatry: Judgement and insight appear normal. Mood & affect appropriate.     Data Reviewed:   CBC: Recent Labs  Lab 11/17/17 0301  11/18/17 0219  11/19/17 1937 11/20/17 1404 11/20/17 1516 11/21/17 0306 11/21/17 1000 11/22/17 0805  WBC 3.4*  --  3.2*  --  6.5  --  6.5 4.6  --  5.1  NEUTROABS 2.7  --  2.6  --   --   --  5.7  --   --   --   HGB 9.8*   < > 9.0*   < > 9.1* 9.2* 8.6* 7.6* 8.2* 7.9*  HCT 29.7*   < > 27.7*   < > 27.2* 27.0* 25.9* 23.7* 24.0* 24.0*  MCV 75.8*  --  75.7*  --  76.6*  --  75.7* 75.7*  --  76.9*  PLT 64*  --  63*  --  47*  --  47* 42*  --  66*   < > = values in this interval not displayed.   Basic Metabolic Panel: Recent Labs  Lab 11/16/17 0158 11/17/17 0301  11/18/17 0219  11/19/17 1937 11/20/17 1404 11/20/17 1516 11/21/17 0306 11/21/17 1000 11/22/17 0805  NA 141 141   < > 142   < > 139 144 142 142 145 137  K 4.0 3.9   < > 3.9   < > 4.3 3.9 4.0 3.7 3.7 4.7  CL 110 107   < > 111   < > 109 104 109 108 102 101  CO2 21* 22  --  22  --  17*  --  20* 22  --  23  GLUCOSE 237* 153*   < > 136*   < > 429* 354* 364* 230* 355* 363*  BUN 78* 81*   < > 73*   < > 87* 72* 92* 86* 71* 87*  CREATININE 3.34* 3.33*   < > 3.38*   < > 3.71* 3.70* 3.81* 3.57* 3.80* 3.77*  CALCIUM 7.5* 7.9*  --  7.5*  --  7.3*  --  7.8* 7.5*  --  7.5*  PHOS 4.4 4.4  --  4.9*  --   --   --   --   --   --   --    < > = values in this interval not displayed.   GFR: Estimated  Creatinine Clearance: 16.8 mL/min (A) (by C-G formula based on SCr of 3.77 mg/dL (H)). Liver Function Tests: Recent Labs  Lab 11/16/17 0158 11/17/17 0301 11/18/17 0219 11/20/17 1516  AST  --   --   --  16  ALT  --   --   --  9*  ALKPHOS  --   --   --  38  BILITOT  --   --   --  2.7*  PROT  --   --   --  5.1*  ALBUMIN 4.5 4.2 4.0 4.4   No results for input(s): LIPASE, AMYLASE in the last 168 hours. No results for input(s): AMMONIA in the last 168 hours. Coagulation Profile: Recent Labs  Lab 11/17/17 0301  INR 1.75   Cardiac Enzymes: No results for input(s): CKTOTAL, CKMB, CKMBINDEX, TROPONINI in the last 168 hours. BNP (last 3 results) Recent Labs    09/25/17 1614  PROBNP 380.0*   HbA1C: No results for input(s): HGBA1C in the last 72 hours. CBG: Recent Labs  Lab 11/21/17 0733 11/21/17 1330 11/21/17 1559 11/21/17 2120 11/22/17 0725  GLUCAP 289* 370* 352* 158* 326*   Lipid Profile: No results for input(s): CHOL, HDL, LDLCALC, TRIG, CHOLHDL, LDLDIRECT in the last 72 hours. Thyroid Function Tests: No results for input(s): TSH, T4TOTAL, FREET4, T3FREE, THYROIDAB in the last 72 hours. Anemia Panel: No results for input(s): VITAMINB12, FOLATE, FERRITIN, TIBC, IRON, RETICCTPCT in the last 72 hours. Sepsis Labs: No results for input(s): PROCALCITON, LATICACIDVEN in the last 168 hours.  No results found for this or any previous visit (from the past 240 hour(s)).       Radiology Studies: No results found.      Scheduled Meds: . amLODipine  10 mg Oral Daily  . calcium carbonate      . calcium carbonate  2 tablet Oral Q3H  . carvedilol  12.5 mg Oral BID WC  . cyclophosphamide  75 mg Oral Daily  . ferrous gluconate  324 mg Oral Daily  . furosemide  40 mg Oral BID  . insulin aspart  0-5 Units Subcutaneous QHS  . insulin aspart  0-9 Units Subcutaneous TID WC  . [START ON 11/23/2017] insulin glargine  15 Units Subcutaneous BH-q7a  . insulin glargine  5 Units  Subcutaneous Once  . multivitamin with minerals  1 tablet Oral Daily  . pantoprazole  40 mg Oral Daily  . [START ON 11/23/2017] predniSONE  60 mg Oral Q breakfast  . sodium bicarbonate  1,300 mg Oral BID   Continuous Infusions: . sodium chloride    . sodium chloride    . anticoagulant sodium citrate    . calcium gluconate IVPB    . citrate dextrose    . citrate dextrose    . methylPREDNISolone (SOLU-MEDROL) injection Stopped (11/21/17 1740)     LOS: 1 day    Time spent: 25 mins    Muadh Creasy Arsenio Loader, MD [facility] Pager [phone]   If 7PM-7AM, please contact night-coverage [email or web address] Password [facility] 11/22/2017, 12:46 PM

## 2017-11-22 NOTE — Plan of Care (Signed)
No SOB or anxiety noted this shift (0700-1900).  Pt calm, interactive with assistance from family for interpretation.

## 2017-11-22 NOTE — Progress Notes (Signed)
Pt down to plasma pheresis, returned to unit at 1600.  CBG level above upper parameter for SSI.  MD notified.  Verbal order received and read back for confirmation.  IV Solumedrol and Lantus doses rescheduled for now.  Pt without signs of distress. Family at bedside.  Pt eating dinner.  Renal US pending, they are aware that he is back on unit.

## 2017-11-22 NOTE — Procedures (Signed)
Patient seen and examined on TPE  1L Plasma volume via R IJ TDC, FFP as replacement fluid.  Treatment adjusted as needed.  Madelon Lips MD [facility] pgr [phone] 1:14 PM

## 2017-11-23 ENCOUNTER — Inpatient Hospital Stay (HOSPITAL_COMMUNITY): Payer: Medicare Other

## 2017-11-23 DIAGNOSIS — I130 Hypertensive heart and chronic kidney disease with heart failure and stage 1 through stage 4 chronic kidney disease, or unspecified chronic kidney disease: Secondary | ICD-10-CM | POA: Diagnosis not present

## 2017-11-23 DIAGNOSIS — E1122 Type 2 diabetes mellitus with diabetic chronic kidney disease: Secondary | ICD-10-CM | POA: Diagnosis not present

## 2017-11-23 DIAGNOSIS — I5032 Chronic diastolic (congestive) heart failure: Secondary | ICD-10-CM | POA: Diagnosis not present

## 2017-11-23 DIAGNOSIS — I129 Hypertensive chronic kidney disease with stage 1 through stage 4 chronic kidney disease, or unspecified chronic kidney disease: Secondary | ICD-10-CM | POA: Diagnosis not present

## 2017-11-23 DIAGNOSIS — N179 Acute kidney failure, unspecified: Secondary | ICD-10-CM | POA: Diagnosis not present

## 2017-11-23 DIAGNOSIS — R609 Edema, unspecified: Secondary | ICD-10-CM

## 2017-11-23 DIAGNOSIS — N009 Acute nephritic syndrome with unspecified morphologic changes: Secondary | ICD-10-CM | POA: Diagnosis not present

## 2017-11-23 DIAGNOSIS — N184 Chronic kidney disease, stage 4 (severe): Secondary | ICD-10-CM | POA: Diagnosis not present

## 2017-11-23 DIAGNOSIS — R042 Hemoptysis: Secondary | ICD-10-CM | POA: Diagnosis not present

## 2017-11-23 DIAGNOSIS — M310 Hypersensitivity angiitis: Secondary | ICD-10-CM | POA: Diagnosis not present

## 2017-11-23 DIAGNOSIS — N049 Nephrotic syndrome with unspecified morphologic changes: Secondary | ICD-10-CM | POA: Diagnosis not present

## 2017-11-23 LAB — THERAPEUTIC PLASMA EXCHANGE (BLOOD BANK)
PLASMA EXCHANGE: 3100
Plasma volume needed: 3100
UNIT DIVISION: 0
UNIT DIVISION: 0
UNIT DIVISION: 0
UNIT DIVISION: 0
UNIT DIVISION: 0
Unit division: 0
Unit division: 0
Unit division: 0
Unit division: 0
Unit division: 0
Unit division: 0
Unit division: 0

## 2017-11-23 LAB — BASIC METABOLIC PANEL
Anion gap: 16 — ABNORMAL HIGH (ref 5–15)
BUN: 92 mg/dL — AB (ref 6–20)
CHLORIDE: 95 mmol/L — AB (ref 101–111)
CO2: 25 mmol/L (ref 22–32)
Calcium: 7.5 mg/dL — ABNORMAL LOW (ref 8.9–10.3)
Creatinine, Ser: 4.01 mg/dL — ABNORMAL HIGH (ref 0.61–1.24)
GFR, EST AFRICAN AMERICAN: 17 mL/min — AB (ref >60)
GFR, EST NON AFRICAN AMERICAN: 14 mL/min — AB (ref >60)
Glucose, Bld: 335 mg/dL — ABNORMAL HIGH (ref 65–99)
POTASSIUM: 4.4 mmol/L (ref 3.5–5.1)
SODIUM: 136 mmol/L (ref 135–145)

## 2017-11-23 LAB — CBC
HEMATOCRIT: 22.2 % — AB (ref 39.0–52.0)
Hemoglobin: 7.2 g/dL — ABNORMAL LOW (ref 13.0–17.0)
MCH: 24.7 pg — ABNORMAL LOW (ref 26.0–34.0)
MCHC: 32.4 g/dL (ref 30.0–36.0)
MCV: 76.3 fL — AB (ref 78.0–100.0)
PLATELETS: 51 10*3/uL — AB (ref 150–400)
RBC: 2.91 MIL/uL — AB (ref 4.22–5.81)
RDW: 17.8 % — AB (ref 11.5–15.5)
WBC: 3.4 10*3/uL — AB (ref 4.0–10.5)

## 2017-11-23 LAB — MAGNESIUM: Magnesium: 1.9 mg/dL (ref 1.7–2.4)

## 2017-11-23 LAB — GLUCOSE, CAPILLARY
GLUCOSE-CAPILLARY: 443 mg/dL — AB (ref 65–99)
Glucose-Capillary: 400 mg/dL — ABNORMAL HIGH (ref 65–99)
Glucose-Capillary: 351 mg/dL — ABNORMAL HIGH (ref 65–99)
Glucose-Capillary: 252 mg/dL — ABNORMAL HIGH (ref 65–99)

## 2017-11-23 MED ORDER — INSULIN ASPART 100 UNIT/ML ~~LOC~~ SOLN
13.0000 [IU] | Freq: Once | SUBCUTANEOUS | Status: AC
  Administered 2017-11-23: 13 [IU] via SUBCUTANEOUS

## 2017-11-23 MED ORDER — INSULIN GLARGINE 100 UNIT/ML ~~LOC~~ SOLN
8.0000 [IU] | Freq: Once | SUBCUTANEOUS | Status: AC
  Administered 2017-11-23: 8 [IU] via SUBCUTANEOUS
  Filled 2017-11-23: qty 0.08

## 2017-11-23 MED ORDER — TRAMADOL HCL 50 MG PO TABS
50.0000 mg | ORAL_TABLET | Freq: Once | ORAL | Status: AC
  Administered 2017-11-23: 50 mg via ORAL
  Filled 2017-11-23: qty 1

## 2017-11-23 MED ORDER — INSULIN GLARGINE 100 UNIT/ML ~~LOC~~ SOLN
22.0000 [IU] | SUBCUTANEOUS | Status: DC
  Administered 2017-11-24 – 2017-11-29 (×6): 22 [IU] via SUBCUTANEOUS
  Filled 2017-11-23 (×6): qty 0.22

## 2017-11-23 MED ORDER — SODIUM CHLORIDE 0.9 % IV BOLUS: 250.0000 mL | Freq: Once | INTRAVENOUS | Status: DC

## 2017-11-23 NOTE — Progress Notes (Signed)
VASCULAR LAB PRELIMINARY  PRELIMINARY  PRELIMINARY  PRELIMINARY  Right lower extremity venous duplex completed.    Preliminary report:  There is no DVT or SVT noted in the right lower extremity.   Junetta Hearn, RVT 11/23/2017, 9:10 AM   +

## 2017-11-23 NOTE — Progress Notes (Signed)
[facility] Progress Note    Assessment/ Plan:   1.  Pulmonary-renal syndrome, anti-GBM: Continue daily PLEX with FFP until hemoptysis resolves and Hgb stable (started 4/18).  The kidney biopsy from 4/15 prelim report showed diabetic GN with secondary FSGS and not enough glomeruli to undergo IF staining for anti-GBM antibodies.  So- he will either need to have another biopsy when plts are acceptable or will need to get pulm input for bronch/ biopsy there.  Each procedure is not without risk so should be carefully considered.  I'm re-pulsing him with IV steroids, 1 g IV solumedrol x 3 days, completed 4/20 and then back to prednisone 60 mg daily (started 4/21).  Continue cytoxan 75 mg daily (1 mg/ kg roughly).  There are case reports of using rituxan for anti-GBM but they are not evidenced based and have not been studied in pulm hemorrhage.  Anti-GBM antibodies pending.  Next TPE 4/22.   2.  Thrombocytopenia- plt transfusion 4/19, keep plts > 50  3.  Anemia- secondary to hemoptysis, will closely monitor CBC  4.  Hemoptysis: CT chest with GGO consistent with pulm hemorrhage- continue to monitor, not on O2, appears to be resolving with daily PLEX  5.  Cirrhosis: had to get Neupogen before discharge (received 4/16).  On Lasix 40 PO BID   Subjective:    Pt reports some residual rusty-colored hemoptysis.  For TPE in AM   Objective:   BP (!) 155/73 (BP Location: Right Arm)   Pulse 82   Temp 98.2 F (36.8 C) (Oral)   Resp 17   Ht 5' 5"  (1.651 m)   Wt 71.2 kg (156 lb 15.5 oz)   SpO2 94%   BMI 26.12 kg/m   Intake/Output Summary (Last 24 hours) at 11/23/2017 1123 Last data filed at 11/22/2017 1800 Gross per 24 hour  Intake 178 ml  Output -  Net 178 ml   Weight change: -0.6 kg (-1 lb 5.2 oz)  Physical Exam: Gen: NAD, lying flat in bed HEENT: no JVD CVS:RRR Resp: bilateral crackles improved  Abd: distention, unchanged Ext: trace LE edema  Imaging: US  Renal  Result Date: 11/22/2017 CLINICAL DATA:  Status post renal biopsy on 11/17/2017 EXAM: RENAL / URINARY TRACT ULTRASOUND COMPLETE COMPARISON:  09/04/2017 FINDINGS: Right Kidney: Length: 8.4 cm. Minimal perinephric fluid is noted along the upper pole of the right kidney this is not in the area of previous biopsy and likely represents the normal appearance of the right kidney. No hematoma from the biopsy site is identified. Left Kidney: Length: 8.2 cm. Echogenicity within normal limits. No mass or hydronephrosis visualized. Bladder: Appears normal for degree of bladder distention. Note is made of mild ascites and mild prominence of the spleen. IMPRESSION: Mild ascites and splenic prominence consistent with the known underlying cirrhosis. Minimal perinephric fluid is noted in the lung the upper pole of the right kidney not in the area of previous biopsy. No perinephric hematoma is noted in the lower pole. Electronically Signed   By: Inez Catalina M.D.   On: 11/22/2017 20:19   Dg Abd Portable 1v  Result Date: 11/22/2017 CLINICAL DATA:  Abdominal distension EXAM: PORTABLE ABDOMEN - 1 VIEW COMPARISON:  None. FINDINGS: Scattered large and small bowel gas is noted. Mild retained fecal material is seen. No free air is noted. No definitive signs of ascites are seen. No acute bony abnormality is noted. IMPRESSION: No acute abnormality noted. Electronically Signed   By: Linus Mako.D.  On: 11/22/2017 17:36    Labs: BMET Recent Labs  Lab 11/17/17 0301  11/18/17 0219  11/19/17 1937 11/20/17 1404 11/20/17 1516 11/21/17 0306 11/21/17 1000 11/22/17 0805 11/23/17 0245  NA 141   < > 142   < > 139 144 142 142 145 137 136  K 3.9   < > 3.9   < > 4.3 3.9 4.0 3.7 3.7 4.7 4.4  CL 107   < > 111   < > 109 104 109 108 102 101 95*  CO2 22  --  22  --  17*  --  20* 22  --  23 25  GLUCOSE 153*   < > 136*   < > 429* 354* 364* 230* 355* 363* 335*  BUN 81*   < > 73*   < > 87* 72* 92* 86* 71* 87* 92*  CREATININE  3.33*   < > 3.38*   < > 3.71* 3.70* 3.81* 3.57* 3.80* 3.77* 4.01*  CALCIUM 7.9*  --  7.5*  --  7.3*  --  7.8* 7.5*  --  7.5* 7.5*  PHOS 4.4  --  4.9*  --   --   --   --   --   --   --   --    < > = values in this interval not displayed.   CBC Recent Labs  Lab 11/17/17 0301  11/18/17 0219  11/20/17 1516 11/21/17 0306 11/21/17 1000 11/22/17 0805 11/23/17 0245  WBC 3.4*  --  3.2*   < > 6.5 4.6  --  5.1 3.4*  NEUTROABS 2.7  --  2.6  --  5.7  --   --   --   --   HGB 9.8*   < > 9.0*   < > 8.6* 7.6* 8.2* 7.9* 7.2*  HCT 29.7*   < > 27.7*   < > 25.9* 23.7* 24.0* 24.0* 22.2*  MCV 75.8*  --  75.7*   < > 75.7* 75.7*  --  76.9* 76.3*  PLT 64*  --  63*   < > 47* 42*  --  66* 51*   < > = values in this interval not displayed.    Medications:    . amLODipine  10 mg Oral Daily  . carvedilol  12.5 mg Oral BID WC  . cyclophosphamide  75 mg Oral Daily  . ferrous gluconate  324 mg Oral Daily  . furosemide  40 mg Oral BID  . insulin aspart  0-5 Units Subcutaneous QHS  . insulin aspart  0-9 Units Subcutaneous TID WC  . insulin glargine  15 Units Subcutaneous BH-q7a  . multivitamin with minerals  1 tablet Oral Daily  . pantoprazole  40 mg Oral Daily  . predniSONE  60 mg Oral Q breakfast  . sodium bicarbonate  1,300 mg Oral BID      Madelon Lips, MD [place] pgr [phone] 11/23/2017, 11:23 AM

## 2017-11-23 NOTE — Progress Notes (Signed)
PROGRESS NOTE    William [name]  ZOX:[id number] DOB: [birth year] DOA: 11/19/2017 PCP: Gildardo Pounds, NP   Brief Narrative:   75 male with history of CKD stage IV, cirrhosis, diabetes mellitus type 2, diastolic congestive heart failure who was recently diagnosed with Goodpasture's disease with outpatient plasmapheresis.  He was hospitalized here from November 06, 2017 to November 18, 2017 where he was treated with Solu-Medrol, plasmapheresis and dialysis was initiated.  Renal biopsy was performed on November 17, 2017 and was discharged on Cytoxan.  During his hospitalization he also received multiple units of transfusion.  After going home patient had episodes of hemoptysis therefore returned to the hospital for further evaluation.  Upon admission his hemoglobin was noted to be 9.1, platelets were down to 47, BUN 87 and creatinine of 3.7.  Assessment & Plan:   Principal Problem:   Goodpasture's syndrome ([place]) with associated hemoptysis Active Problems:   Type II diabetes mellitus with renal manifestations (HCC)   Pancytopenia (HCC)   Acute renal failure superimposed on stage 4 chronic kidney disease (HCC)   Essential hypertension   Hemoptysis   Asymmetric edema of both lower extremities   Metabolic acidosis  Hemoptysis with normocytic anemia, improved  Goodpasture's Disease with anti GBM -Nephrology following, appreciate input. Case Discussed with Dr Hollie Salk- prelim report shows Diabetic GN with secondary FSGS but not enough sample for IF staining.  -Solu-Medrol 1 g daily x3 completed.  Now on prednisone 60 mg daily 4/21 (tentative plans 30 days for now).  -Cont Cytoxan 69m po daily.  -Anti-GBM-sent -Cont daily Plamaphoresis and transfusion as needed. Plans for Plamaphoresis tomorrow.  -Eventually will decide if patient would need another renal Bx vs Bronch   Pancytopenia -s/p 2 U FFP and 1 U Plt.  -Slightly downtrending hemoglobin and platelets. Will tx if Plt <50 per nephro  Distended  abdomen, resolved -Abdominal x-rays negative  CKD Stage III-IV -Creatinine is up to 4.0 this morning.  Up from 3.7 -Keep a close eye on this.  Diabetes mellitus type 2 with Renal manifestations  Hyperglycemia; likely steroid-induced -Cont Lantus from 15 units to 22 units units, novolog 5units premeals, and ISS  Essential HTN -Cont Coreg and Norvasc 175mpo daily.   B/L LE Swelling R>L -LE dopplers but results are pending  History Of Cirrhosis.  -Supportive care   DVT prophylaxis: SCDs Code Status: Full  Family Communication:  None at bedside  Disposition Plan: TBD  Consultants:   Nephro  Procedures:   None  Antimicrobials:   None   Subjective: Patient is 90 complaints this morning.  He is resting well.  Tolerating oral diet  Review of Systems Otherwise negative except as per HPI, including: HEENT/EYES = negative for pain, redness, loss of vision, double vision, blurred vision, loss of hearing, sore throat, hoarseness, dysphagia Cardiovascular= negative for chest pain, palpitation, murmurs, lower extremity swelling Respiratory/lungs= negative for shortness of breath, cough, hemoptysis, wheezing, mucus production Gastrointestinal= negative for nausea, vomiting,, abdominal pain, melena, hematemesis Genitourinary= negative for Dysuria, Hematuria, Change in Urinary Frequency MSK = Negative for arthralgia, myalgias, Back Pain, Joint swelling  Neurology= Negative for headache, seizures, numbness, tingling  Psychiatry= Negative for anxiety, depression, suicidal and homocidal ideation Allergy/Immunology= Medication/Food allergy as listed  Skin= Negative for Rash, lesions, ulcers, itching  Objective: Vitals:   11/22/17 1704 11/22/17 2334 11/23/17 0700 11/23/17 0730  BP: (!) 163/74 138/68  (!) 155/73  Pulse: 88 76  82  Resp: 16   17  Temp: 97.9 F (36.6  C) 98.3 F (36.8 C)  98.2 F (36.8 C)  TempSrc: Oral Oral  Oral  SpO2: 94% 97%  94%  Weight:   71.2 kg (156 lb  15.5 oz)   Height:        Intake/Output Summary (Last 24 hours) at 11/23/2017 1325 Last data filed at 11/22/2017 1800 Gross per 24 hour  Intake 178 ml  Output -  Net 178 ml   Filed Weights   11/21/17 0640 11/22/17 0638 11/23/17 0700  Weight: 66.2 kg (145 lb 15.1 oz) 71.8 kg (158 lb 4.6 oz) 71.2 kg (156 lb 15.5 oz)    Examination:  Constitutional: NAD, calm, comfortable Eyes: PERRL, lids and conjunctivae normal ENMT: Mucous membranes are moist. Posterior pharynx clear of any exudate or lesions.Normal dentition.  Neck: normal, supple, no masses, no thyromegaly Respiratory: Bibasilar crackles Cardiovascular: Regular rate and rhythm, no murmurs / rubs / gallops. No extremity edema. 2+ pedal pulses. No carotid bruits.  Abdomen: no tenderness, no masses palpated. No hepatosplenomegaly. Bowel sounds positive.  Musculoskeletal: no clubbing / cyanosis. No joint deformity upper and lower extremities. Good ROM, no contractures. Normal muscle tone.  Skin: no rashes, lesions, ulcers. No induration Neurologic: CN 2-12 grossly intact. Sensation intact, DTR normal. Strength 5/5 in all 4.  Psychiatric: Normal judgment and insight. Alert and oriented x 3. Normal mood.      Data Reviewed:   CBC: Recent Labs  Lab 11/17/17 0301  11/18/17 0219  11/19/17 1937  11/20/17 1516 11/21/17 0306 11/21/17 1000 11/22/17 0805 11/23/17 0245  WBC 3.4*  --  3.2*  --  6.5  --  6.5 4.6  --  5.1 3.4*  NEUTROABS 2.7  --  2.6  --   --   --  5.7  --   --   --   --   HGB 9.8*   < > 9.0*   < > 9.1*   < > 8.6* 7.6* 8.2* 7.9* 7.2*  HCT 29.7*   < > 27.7*   < > 27.2*   < > 25.9* 23.7* 24.0* 24.0* 22.2*  MCV 75.8*  --  75.7*  --  76.6*  --  75.7* 75.7*  --  76.9* 76.3*  PLT 64*  --  63*  --  47*  --  47* 42*  --  66* 51*   < > = values in this interval not displayed.   Basic Metabolic Panel: Recent Labs  Lab 11/17/17 0301  11/18/17 0219  11/19/17 1937  11/20/17 1516 11/21/17 0306 11/21/17 1000  11/22/17 0805 11/23/17 0245  NA 141   < > 142   < > 139   < > 142 142 145 137 136  K 3.9   < > 3.9   < > 4.3   < > 4.0 3.7 3.7 4.7 4.4  CL 107   < > 111   < > 109   < > 109 108 102 101 95*  CO2 22  --  22  --  17*  --  20* 22  --  23 25  GLUCOSE 153*   < > 136*   < > 429*   < > 364* 230* 355* 363* 335*  BUN 81*   < > 73*   < > 87*   < > 92* 86* 71* 87* 92*  CREATININE 3.33*   < > 3.38*   < > 3.71*   < > 3.81* 3.57* 3.80* 3.77* 4.01*  CALCIUM 7.9*  --  7.5*  --  7.3*  --  7.8* 7.5*  --  7.5* 7.5*  MG  --   --   --   --   --   --   --   --   --   --  1.9  PHOS 4.4  --  4.9*  --   --   --   --   --   --   --   --    < > = values in this interval not displayed.   GFR: Estimated Creatinine Clearance: 15.8 mL/min (A) (by C-G formula based on SCr of 4.01 mg/dL (H)). Liver Function Tests: Recent Labs  Lab 11/17/17 0301 11/18/17 0219 11/20/17 1516  AST  --   --  16  ALT  --   --  9*  ALKPHOS  --   --  38  BILITOT  --   --  2.7*  PROT  --   --  5.1*  ALBUMIN 4.2 4.0 4.4   No results for input(s): LIPASE, AMYLASE in the last 168 hours. No results for input(s): AMMONIA in the last 168 hours. Coagulation Profile: Recent Labs  Lab 11/17/17 0301  INR 1.75   Cardiac Enzymes: No results for input(s): CKTOTAL, CKMB, CKMBINDEX, TROPONINI in the last 168 hours. BNP (last 3 results) Recent Labs    09/25/17 1614  PROBNP 380.0*   HbA1C: No results for input(s): HGBA1C in the last 72 hours. CBG: Recent Labs  Lab 11/22/17 1658 11/22/17 1916 11/22/17 2105 11/23/17 0731 11/23/17 1122  GLUCAP 416* 429* 350* 400* 443*   Lipid Profile: No results for input(s): CHOL, HDL, LDLCALC, TRIG, CHOLHDL, LDLDIRECT in the last 72 hours. Thyroid Function Tests: No results for input(s): TSH, T4TOTAL, FREET4, T3FREE, THYROIDAB in the last 72 hours. Anemia Panel: No results for input(s): VITAMINB12, FOLATE, FERRITIN, TIBC, IRON, RETICCTPCT in the last 72 hours. Sepsis Labs: No results for  input(s): PROCALCITON, LATICACIDVEN in the last 168 hours.  No results found for this or any previous visit (from the past 240 hour(s)).       Radiology Studies: US Renal  Result Date: 11/22/2017 CLINICAL DATA:  Status post renal biopsy on 11/17/2017 EXAM: RENAL / URINARY TRACT ULTRASOUND COMPLETE COMPARISON:  09/04/2017 FINDINGS: Right Kidney: Length: 8.4 cm. Minimal perinephric fluid is noted along the upper pole of the right kidney this is not in the area of previous biopsy and likely represents the normal appearance of the right kidney. No hematoma from the biopsy site is identified. Left Kidney: Length: 8.2 cm. Echogenicity within normal limits. No mass or hydronephrosis visualized. Bladder: Appears normal for degree of bladder distention. Note is made of mild ascites and mild prominence of the spleen. IMPRESSION: Mild ascites and splenic prominence consistent with the known underlying cirrhosis. Minimal perinephric fluid is noted in the lung the upper pole of the right kidney not in the area of previous biopsy. No perinephric hematoma is noted in the lower pole. Electronically Signed   By: Inez Catalina M.D.   On: 11/22/2017 20:19   Dg Abd Portable 1v  Result Date: 11/22/2017 CLINICAL DATA:  Abdominal distension EXAM: PORTABLE ABDOMEN - 1 VIEW COMPARISON:  None. FINDINGS: Scattered large and small bowel gas is noted. Mild retained fecal material is seen. No free air is noted. No definitive signs of ascites are seen. No acute bony abnormality is noted. IMPRESSION: No acute abnormality noted. Electronically Signed   By: Inez Catalina M.D.   On: 11/22/2017 17:36  Scheduled Meds: . amLODipine  10 mg Oral Daily  . carvedilol  12.5 mg Oral BID WC  . cyclophosphamide  75 mg Oral Daily  . ferrous gluconate  324 mg Oral Daily  . furosemide  40 mg Oral BID  . insulin aspart  0-5 Units Subcutaneous QHS  . insulin aspart  0-9 Units Subcutaneous TID WC  . insulin glargine  15 Units  Subcutaneous BH-q7a  . multivitamin with minerals  1 tablet Oral Daily  . pantoprazole  40 mg Oral Daily  . predniSONE  60 mg Oral Q breakfast  . sodium bicarbonate  1,300 mg Oral BID   Continuous Infusions: . sodium chloride    . sodium chloride    . citrate dextrose       LOS: 2 days    Time spent: 25 mins    Ankit Arsenio Loader, MD [facility] Pager [phone]   If 7PM-7AM, please contact night-coverage [email or web address] Password [email or web address] 11/23/2017, 1:25 PM

## 2017-11-24 DIAGNOSIS — E1122 Type 2 diabetes mellitus with diabetic chronic kidney disease: Secondary | ICD-10-CM | POA: Diagnosis not present

## 2017-11-24 DIAGNOSIS — M310 Hypersensitivity angiitis: Secondary | ICD-10-CM | POA: Diagnosis not present

## 2017-11-24 DIAGNOSIS — I130 Hypertensive heart and chronic kidney disease with heart failure and stage 1 through stage 4 chronic kidney disease, or unspecified chronic kidney disease: Secondary | ICD-10-CM | POA: Diagnosis not present

## 2017-11-24 DIAGNOSIS — I5032 Chronic diastolic (congestive) heart failure: Secondary | ICD-10-CM | POA: Diagnosis not present

## 2017-11-24 DIAGNOSIS — N049 Nephrotic syndrome with unspecified morphologic changes: Secondary | ICD-10-CM | POA: Diagnosis not present

## 2017-11-24 DIAGNOSIS — N184 Chronic kidney disease, stage 4 (severe): Secondary | ICD-10-CM | POA: Diagnosis not present

## 2017-11-24 DIAGNOSIS — N179 Acute kidney failure, unspecified: Secondary | ICD-10-CM | POA: Diagnosis not present

## 2017-11-24 DIAGNOSIS — R042 Hemoptysis: Secondary | ICD-10-CM | POA: Diagnosis not present

## 2017-11-24 DIAGNOSIS — I129 Hypertensive chronic kidney disease with stage 1 through stage 4 chronic kidney disease, or unspecified chronic kidney disease: Secondary | ICD-10-CM | POA: Diagnosis not present

## 2017-11-24 DIAGNOSIS — N009 Acute nephritic syndrome with unspecified morphologic changes: Secondary | ICD-10-CM | POA: Diagnosis not present

## 2017-11-24 LAB — BASIC METABOLIC PANEL
ANION GAP: 15 (ref 5–15)
BUN: 105 mg/dL — ABNORMAL HIGH (ref 6–20)
CO2: 26 mmol/L (ref 22–32)
Calcium: 7 mg/dL — ABNORMAL LOW (ref 8.9–10.3)
Chloride: 93 mmol/L — ABNORMAL LOW (ref 101–111)
Creatinine, Ser: 4.32 mg/dL — ABNORMAL HIGH (ref 0.61–1.24)
GFR calc Af Amer: 15 mL/min — ABNORMAL LOW (ref >60)
GFR, EST NON AFRICAN AMERICAN: 13 mL/min — AB (ref >60)
GLUCOSE: 235 mg/dL — AB (ref 65–99)
POTASSIUM: 4 mmol/L (ref 3.5–5.1)
Sodium: 134 mmol/L — ABNORMAL LOW (ref 135–145)

## 2017-11-24 LAB — PREPARE RBC (CROSSMATCH)

## 2017-11-24 LAB — POCT I-STAT, CHEM 8
BUN: 77 mg/dL — ABNORMAL HIGH (ref 6–20)
Calcium, Ion: 0.62 mmol/L — CL (ref 1.15–1.40)
Chloride: 95 mmol/L — ABNORMAL LOW (ref 101–111)
Creatinine, Ser: 3.8 mg/dL — ABNORMAL HIGH (ref 0.61–1.24)
GLUCOSE: 399 mg/dL — AB (ref 65–99)
HCT: 24 % — ABNORMAL LOW (ref 39.0–52.0)
HEMOGLOBIN: 8.2 g/dL — AB (ref 13.0–17.0)
POTASSIUM: 4.2 mmol/L (ref 3.5–5.1)
Sodium: 140 mmol/L (ref 135–145)
TCO2: 24 mmol/L (ref 22–32)

## 2017-11-24 LAB — CBC
HEMATOCRIT: 19.8 % — AB (ref 39.0–52.0)
HEMOGLOBIN: 6.8 g/dL — AB (ref 13.0–17.0)
MCH: 25.7 pg — ABNORMAL LOW (ref 26.0–34.0)
MCHC: 34.3 g/dL (ref 30.0–36.0)
MCV: 74.7 fL — ABNORMAL LOW (ref 78.0–100.0)
Platelets: 50 10*3/uL — ABNORMAL LOW (ref 150–400)
RBC: 2.65 MIL/uL — ABNORMAL LOW (ref 4.22–5.81)
RDW: 18.1 % — ABNORMAL HIGH (ref 11.5–15.5)
WBC: 3 10*3/uL — ABNORMAL LOW (ref 4.0–10.5)

## 2017-11-24 LAB — GLUCOSE, CAPILLARY
GLUCOSE-CAPILLARY: 208 mg/dL — AB (ref 65–99)
GLUCOSE-CAPILLARY: 273 mg/dL — AB (ref 65–99)
Glucose-Capillary: 232 mg/dL — ABNORMAL HIGH (ref 65–99)

## 2017-11-24 LAB — GLOMERULAR BASEMENT MEMBRANE ANTIBODIES: GBM Ab: 6 units (ref 0–20)

## 2017-11-24 LAB — MAGNESIUM: Magnesium: 1.8 mg/dL (ref 1.7–2.4)

## 2017-11-24 MED ORDER — ACETAMINOPHEN 325 MG PO TABS: 650.0000 mg | ORAL_TABLET | ORAL | Status: DC | PRN

## 2017-11-24 MED ORDER — SODIUM CHLORIDE 0.9 % IV SOLN
Freq: Once | INTRAVENOUS | Status: AC
  Administered 2017-11-24: 09:00:00 via INTRAVENOUS

## 2017-11-24 MED ORDER — SODIUM CHLORIDE 0.9 % IV SOLN
2.0000 g | Freq: Once | INTRAVENOUS | Status: DC
  Filled 2017-11-24: qty 20

## 2017-11-24 MED ORDER — CALCIUM CARBONATE ANTACID 500 MG PO CHEW
CHEWABLE_TABLET | ORAL | Status: AC
  Administered 2017-11-25: 10:00:00
  Filled 2017-11-24: qty 2

## 2017-11-24 MED ORDER — SODIUM CHLORIDE 0.9 % IV SOLN
Freq: Once | INTRAVENOUS | Status: AC
  Administered 2017-11-24: 05:00:00 via INTRAVENOUS

## 2017-11-24 MED ORDER — ACETAMINOPHEN 325 MG PO TABS
ORAL_TABLET | ORAL | Status: AC
  Administered 2017-11-24: 650 mg via ORAL
  Filled 2017-11-24: qty 2

## 2017-11-24 MED ORDER — SIMETHICONE 80 MG PO CHEW
80.0000 mg | CHEWABLE_TABLET | Freq: Four times a day (QID) | ORAL | Status: DC | PRN
  Administered 2017-11-24: 80 mg via ORAL
  Filled 2017-11-24: qty 1

## 2017-11-24 MED ORDER — DIPHENHYDRAMINE HCL 25 MG PO CAPS: 25.0000 mg | ORAL_CAPSULE | Freq: Four times a day (QID) | ORAL | Status: DC | PRN

## 2017-11-24 MED ORDER — ANTICOAGULANT SODIUM CITRATE 4% (200MG/5ML) IV SOLN
5.0000 mL | Freq: Once | Status: DC
  Filled 2017-11-24: qty 250

## 2017-11-24 MED ORDER — ACD FORMULA A 0.73-2.45-2.2 GM/100ML VI SOLN: 500.0000 mL | Status: DC

## 2017-11-24 MED ORDER — CALCIUM CARBONATE ANTACID 500 MG PO CHEW: 2.0000 | CHEWABLE_TABLET | ORAL | Status: AC

## 2017-11-24 MED ORDER — ACD FORMULA A 0.73-2.45-2.2 GM/100ML VI SOLN
Status: AC
  Administered 2017-11-25: 500 mL via INTRAVENOUS
  Filled 2017-11-24: qty 500

## 2017-11-24 MED ORDER — DIPHENHYDRAMINE HCL 25 MG PO CAPS
ORAL_CAPSULE | ORAL | Status: AC
  Administered 2017-11-24: 25 mg via ORAL
  Filled 2017-11-24: qty 1

## 2017-11-24 NOTE — Consult Note (Signed)
Name: William [name] MRN: [id number] DOB: 1951/06/29    ADMISSION DATE:  11/19/2017 CONSULTATION DATE:  11/24/17   REFERRING MD :  Dr.  Reesa Chew / [facility]   CHIEF COMPLAINT:  Hemoptysis & chest pressure.   HISTORY OF PRESENT ILLNESS:  67 y/o M, from [place] / [place], who presented to [facility] on 4/17 with reports of hemoptysis and chest pressure.   The patient reported symptoms began 4/17.  He was recently admitted from 4/4-4/16 with hemoptysis.  Hospital work up notable for acute on chronic renal failure with concern for Good Pasture Syndrome.  He was evaluated by Nephrology.  Renal biopsy completed which was consistent with diabetic GN with secondary FSGS and not enough glomeruli to undergo IF staining for anti-GBM antibiodies.  He treated with plasmapheresis, IV solumedrol and started on cyclophosphamide 74m QD and given a prescription at discharge.  The patient did not fill his prescription.  The plan was to complete plasmapheresis as an outpatient.  In addition, he was pancytopenic and seen by ONC with recommendations to transfuse (received 3 units during admit) and give aranesp.  Pancytopenia felt to be reactive.    This admit, he returned with ongoing hemoptysis.  He has been re-evaluated by Nephrology. Treated with daily PLEX & FFP (started 4/18).  In addition, he was given pulse steroids & cytoxan.  He continued to have residual rusty colored sputum up to 4/21.  He denies hemoptysis on 4/22.  States he had 3-5 episodes of small amount blood mixed with mucus on 4/21.   PCCM consulted for possible bronchoscopy for biopsy.    PAST MEDICAL HISTORY :   has a past medical history of Anemia, CHF (congestive heart failure) ([place], Cirrhosis ([place], Cirrhosis ([place], CKD (chronic kidney disease), stage IV ([place], Goodpasture's disease ([place], Grade I diastolic dysfunction (010/02/1218, History of anemia due to chronic kidney disease, History of blood transfusion (X 1), History of plasmapheresis,  Hypertension, Pancytopenia ([place] (08/20/2017), and Type II diabetes mellitus ([place].   has a past surgical history that includes Neck surgery (2007); IR Fluoro Guide CV Line Right (11/10/2017); IR UKoreaGuide Vasc Access Right (11/10/2017); and Cataract extraction w/ intraocular lens  implant, bilateral (Bilateral).  Prior to Admission medications   Medication Sig Start Date End Date Taking? Authorizing Provider  amLODipine (NORVASC) 10 MG tablet Take 1 tablet (10 mg total) daily by mouth. 06/20/17 11/06/24 Yes FGildardo Pounds NP  carvedilol (COREG) 12.5 MG tablet Take 12.5 mg by mouth 2 (two) times daily with a meal.   Yes [provider]  dextromethorphan-guaiFENesin (MUCINEX DM) 30-600 MG 12hr tablet Take 1 tablet by mouth 2 (two) times daily as needed for cough. 11/18/17  Yes Regalado, Belkys A, MD  ferrous gluconate (IRON 27) 240 (27 FE) MG tablet Take 1 tablet by mouth daily.   Yes [provider]  furosemide (LASIX) 40 MG tablet Take 1 tablet (40 mg total) by mouth 2 (two) times daily. 11/18/17 11/18/18 Yes Regalado, Belkys A, MD  insulin NPH-regular Human (NOVOLIN 70/30) (70-30) 100 UNIT/ML injection Inject 18 Units into the skin 2 (two) times daily with a meal. 11/18/17  Yes Regalado, Belkys A, MD  Multiple Vitamins-Minerals (MULTIVITAMIN WITH MINERALS) tablet Take 1 tablet by mouth daily.   Yes [provider]  pantoprazole (PROTONIX) 40 MG tablet Take 1 tablet (40 mg total) by mouth daily. Take 30-60 min before first meal of the day 10/13/17  Yes WTanda Rockers MD  predniSONE (DELTASONE) 20 MG tablet  Take 3 tablets (60 mg total) by mouth daily with breakfast. 11/18/17  Yes Regalado, Belkys A, MD  sodium bicarbonate 650 MG tablet Take 2 tablets (1,300 mg total) by mouth 2 (two) times daily. 11/18/17  Yes Regalado, Belkys A, MD  cyclophosphamide (CYTOXAN) 25 MG capsule Take 3 capsules (75 mg total) by mouth daily. Give on an empty stomach 1 hour before or 2 hours after meals.  11/18/17   Regalado, Cassie Freer, MD    Allergies  Allergen Reactions  . Heparin     Patient is Muslim and is not permitted Pork derative due to religious belief)    FAMILY HISTORY:  family history includes Diabetes Mellitus II in his sister; Stroke in his brother.  SOCIAL HISTORY:  reports that he has never smoked. He has never used smokeless tobacco. He reports that he does not drink alcohol or use drugs.  REVIEW OF SYSTEMS:  POSITIVES IN BOLD Constitutional: Negative for fever, chills, weight loss, malaise/fatigue and diaphoresis.  HENT: Negative for hearing loss, ear pain, nosebleeds, congestion, sore throat, neck pain, tinnitus and ear discharge.   Eyes: Negative for blurred vision, double vision, photophobia, pain, discharge and redness.  Respiratory: Negative for cough, hemoptysis, sputum production, shortness of breath, wheezing and stridor.   Cardiovascular: Negative for chest pain, chest pressure, palpitations, orthopnea, claudication, leg swelling and PND.  Gastrointestinal: Negative for heartburn, nausea, vomiting, abdominal pain, diarrhea, constipation, blood in stool and melena.  Genitourinary: Negative for dysuria, urgency, frequency, hematuria and flank pain.  Musculoskeletal: Negative for myalgias, back pain, joint pain and falls.  Skin: Negative for itching and rash.  Neurological: Negative for dizziness, tingling, tremors, sensory change, speech change, focal weakness, seizures, loss of consciousness, weakness and headaches.  Endo/Heme/Allergies: Negative for environmental allergies and polydipsia. Does not bruise/bleed easily.   SUBJECTIVE:   VITAL SIGNS: Temp:  [97.7 F (36.5 C)-98.2 F (36.8 C)] 97.9 F (36.6 C) (04/22 1151) Pulse Rate:  [79-83] 80 (04/22 1151) Resp:  [11-18] 17 (04/22 0916) BP: (139-172)/(62-81) 163/81 (04/22 1151) SpO2:  [93 %-99 %] 97 % (04/22 1151) Weight:  [159 lb 6.3 oz (72.3 kg)] 159 lb 6.3 oz (72.3 kg) (04/22 0500)  PHYSICAL  EXAMINATION: General: adult male in NAD, sitting in bed during PLEX Neuro:  AAOx4, speech clear, son utilized for interpretation HEENT:  MM pink/moist, glasses in place  Cardiovascular:  s1s2 rrr, no m/r/g  Lungs:  Even/non-labored, lungs bilaterally clear Abdomen:  Soft, non-tender Musculoskeletal:  No acute deformities  Skin:  No rashes or lesions  Recent Labs  Lab 11/22/17 0805 11/22/17 1214 11/23/17 0245 11/24/17 0223  NA 137 140 136 134*  K 4.7 4.2 4.4 4.0  CL 101 95* 95* 93*  CO2 23  --  25 26  BUN 87* 77* 92* 105*  CREATININE 3.77* 3.80* 4.01* 4.32*  GLUCOSE 363* 399* 335* 235*    Recent Labs  Lab 11/22/17 0805 11/22/17 1214 11/23/17 0245 11/24/17 0223  HGB 7.9* 8.2* 7.2* 6.8*  HCT 24.0* 24.0* 22.2* 19.8*  WBC 5.1  --  3.4* 3.0*  PLT 66*  --  51* 50*    US Renal  Result Date: 11/22/2017 CLINICAL DATA:  Status post renal biopsy on 11/17/2017 EXAM: RENAL / URINARY TRACT ULTRASOUND COMPLETE COMPARISON:  09/04/2017 FINDINGS: Right Kidney: Length: 8.4 cm. Minimal perinephric fluid is noted along the upper pole of the right kidney this is not in the area of previous biopsy and likely represents the normal appearance of the right kidney.  No hematoma from the biopsy site is identified. Left Kidney: Length: 8.2 cm. Echogenicity within normal limits. No mass or hydronephrosis visualized. Bladder: Appears normal for degree of bladder distention. Note is made of mild ascites and mild prominence of the spleen. IMPRESSION: Mild ascites and splenic prominence consistent with the known underlying cirrhosis. Minimal perinephric fluid is noted in the lung the upper pole of the right kidney not in the area of previous biopsy. No perinephric hematoma is noted in the lower pole. Electronically Signed   By: Inez Catalina M.D.   On: 11/22/2017 20:19   Dg Abd Portable 1v  Result Date: 11/22/2017 CLINICAL DATA:  Abdominal distension EXAM: PORTABLE ABDOMEN - 1 VIEW COMPARISON:  None. FINDINGS:  Scattered large and small bowel gas is noted. Mild retained fecal material is seen. No free air is noted. No definitive signs of ascites are seen. No acute bony abnormality is noted. IMPRESSION: No acute abnormality noted. Electronically Signed   By: Inez Catalina M.D.   On: 11/22/2017 17:36     SIGNIFICANT EVENTS  4/4-4/16  Admit with hemoptysis, AKI on CKD, concern for pulmonary-renal syndrome  4/18  Admit with hemoptysis, chest pressure   STUDIES 4/15  Renal Biopsy >> showed diabetic GN with secondary FSGS and not enough glomeruli to undergo IF staining for anti-GBM antibodies.  CULTURES  ANTIBIOTICS     ASSESSMENT / PLAN:  Discussion:  67 y/o M admitted 4/17 with hemoptysis.  Recent admit from 4/4-4/16 with hemoptysis, AKI on CKD, pancytopenia.  Work up concerning for possible pulmonary-renal syndrome.  He has been treated with PLEX, FFP, cytoxan and pulse steroids.  Preliminary renal biopsy on 4/15 showed diabetic GN with secondary FSGS and not enough glomeruli to undergo IF staining for anti-GBM antibodies.  Serum Anti-GBM weakly positive prior admit.  Hemoptysis slowed to resolved on 4/22.    Hemoptysis  Concern for Pulmonary-Renal Syndrome  Pancytopenia   Plan: Continue PLEX, steroids, cytoxan, FFP per Nephrology  Goal platelets > 50k Consider transbronchial biopsy but platelets likely prohibitive at this time due to bleeding risk Monitor hemoptysis volume, Hgb  Follow intermittent CXR  Pulmonary hygiene - IS, mobilize, OOB  Attending to follow.   Noe Gens, NP-C [facility] Pgr: [phone] or if no answer [phone] 11/24/2017, 1:22 PM

## 2017-11-24 NOTE — Procedures (Signed)
Patient was seen and examined during TPE. Tolerating well. FFP as replacement fluid.   Patient appears to be tolerating treatment well  William [name] 11/24/2017

## 2017-11-24 NOTE — Progress Notes (Signed)
PROGRESS NOTE    William [name]  RKY:[id number] DOB: Aug 27, 1950 DOA: 11/19/2017 PCP: Gildardo Pounds, NP   Brief Narrative:   87 male with history of CKD stage IV, cirrhosis, diabetes mellitus type 2, diastolic congestive heart failure who was recently diagnosed with Goodpasture's disease with outpatient plasmapheresis.  He was hospitalized here from November 06, 2017 to November 18, 2017 where he was treated with Solu-Medrol, plasmapheresis and dialysis was initiated.  Renal biopsy was performed on November 17, 2017 and was discharged on Cytoxan.  During his hospitalization he also received multiple units of transfusion.  After going home patient had episodes of hemoptysis therefore returned to the hospital for further evaluation.  Upon admission his hemoglobin was noted to be 9.1, platelets were down to 47, BUN 87 and creatinine of 3.7.  Assessment & Plan:   Principal Problem:   Goodpasture's syndrome ([place]) with associated hemoptysis Active Problems:   Type II diabetes mellitus with renal manifestations (HCC)   Pancytopenia (HCC)   Acute renal failure superimposed on stage 4 chronic kidney disease (HCC)   Essential hypertension   Hemoptysis   Asymmetric edema of both lower extremities   Metabolic acidosis  Hemoptysis with normocytic anemia, improved  Goodpasture's Disease with anti GBM -Nephrology following, appreciate input. Case Discussed with Dr Hollie Salk- prelim report shows Diabetic GN with secondary FSGS but not enough sample for IF staining.  -Solu-Medrol 1 g daily x3 completed.  Now on prednisone 60 mg daily 4/21 (tentative plans 30 days for now).  -Cont Cytoxan 66m po daily.  -Anti-GBM-sent- results are pending.  -Cont daily Plamaphoresis and transfusion as needed. Plasmopheresis per Nephro  -Plans to transfuse the patient today 2U PRBC, 1 U Plt.  -Eventually will decide if patient would need another renal Bx vs Bronch  -spoke with Pulmonary - Dr GPhillip Healwho will see the patient for their  input.   Pancytopenia -Tx as deemed needed.  -Slightly downtrending hemoglobin and platelets. Will tx if Plt <50 per nephro  Abdominal Distention -likely from gas, AXR is otherwise neg. Advised to ambulate more. Simethicone ordered.   Facial Swelling -likely from fluid and low protein state. But will keep a close eye out on this since he has a catheter in his chest wall in place with concerns of DVT.   Distended abdomen, resolved -Abdominal x-rays negative  CKD Stage III-IV -Cont to monitor Cr. He is getting HD intermittently.  -Keep a close eye on this.  Diabetes mellitus type 2 with Renal manifestations  Hyperglycemia; likely steroid-induced -Cont Lantus  22 units units, novolog 5units premeals, and ISS  Essential HTN -Cont Coreg and Norvasc 1104mpo daily.   B/L LE Swelling R>L -LE dopplers - neg for DVT.   History Of Cirrhosis.  -Supportive care   DVT prophylaxis: SCDs Code Status: Full  Family Communication:  Son at bedside  Disposition Plan: TBD  Consultants:   Nephro  Pulm  Procedures:   None  Antimicrobials:   None   Subjective: Reports of some facial swelling and abd distention.  No other acute events overnight.  Review of Systems Otherwise negative except as per HPI, including: HEENT/EYES = negative for pain, redness, loss of vision, double vision, blurred vision, loss of hearing, sore throat, hoarseness, dysphagia Cardiovascular= negative for chest pain, palpitation, murmurs, lower extremity swelling Respiratory/lungs= negative for shortness of breath, cough, hemoptysis, wheezing, mucus production Gastrointestinal= negative for nausea, vomiting,, melena, hematemesis Genitourinary= negative for Dysuria, Hematuria, Change in Urinary Frequency MSK = Negative for arthralgia,  myalgias, Back Pain, Joint swelling  Neurology= Negative for headache, seizures, numbness, tingling  Psychiatry= Negative for anxiety, depression, suicidal and homocidal  ideation Allergy/Immunology= Medication/Food allergy as listed  Skin= Negative for Rash, lesions, ulcers, itching   Objective: Vitals:   11/24/17 0831 11/24/17 0845 11/24/17 0916 11/24/17 1151  BP: (!) 166/77 (!) 172/78 (!) 161/79 (!) 163/81  Pulse: 83 83 81 80  Resp: 16 18 17    Temp: 98 F (36.7 C) 98.1 F (36.7 C) 98.2 F (36.8 C) 97.9 F (36.6 C)  TempSrc: Oral Oral Oral Oral  SpO2: 94% 95% 95% 97%  Weight:      Height:        Intake/Output Summary (Last 24 hours) at 11/24/2017 1230 Last data filed at 11/24/2017 1135 Gross per 24 hour  Intake 693.3 ml  Output 200 ml  Net 493.3 ml   Filed Weights   11/22/17 0638 11/23/17 0700 11/24/17 0500  Weight: 71.8 kg (158 lb 4.6 oz) 71.2 kg (156 lb 15.5 oz) 72.3 kg (159 lb 6.3 oz)    Examination:  Constitutional: NAD, calm, comfortable Eyes: PERRL, lids and conjunctivae normal ENMT: Mucous membranes are moist. Posterior pharynx clear of any exudate or lesions.Normal dentition.  Slight facial swelling Neck: normal, supple, no masses, no thyromegaly Respiratory: Bibasilar crackles Cardiovascular: Regular rate and rhythm, no murmurs / rubs / gallops. No extremity edema. 2+ pedal pulses. No carotid bruits.  Right chest wall catheter in place Abdomen: Slightly distended abdomen, no tenderness, no masses palpated. No hepatosplenomegaly. Bowel sounds positive.  Musculoskeletal: no clubbing / cyanosis. No joint deformity upper and lower extremities. Good ROM, no contractures. Normal muscle tone.  Skin: no rashes, lesions, ulcers. No induration Neurologic: CN 2-12 grossly intact. Sensation intact, DTR normal. Strength 5/5 in all 4.  Psychiatric: Normal judgment and insight. Alert and oriented x 3. Normal mood.   Data Reviewed:   CBC: Recent Labs  Lab 11/18/17 0219  11/20/17 1516 11/21/17 0306 11/21/17 1000 11/22/17 0805 11/22/17 1214 11/23/17 0245 11/24/17 0223  WBC 3.2*   < > 6.5 4.6  --  5.1  --  3.4* 3.0*  NEUTROABS 2.6   --  5.7  --   --   --   --   --   --   HGB 9.0*   < > 8.6* 7.6* 8.2* 7.9* 8.2* 7.2* 6.8*  HCT 27.7*   < > 25.9* 23.7* 24.0* 24.0* 24.0* 22.2* 19.8*  MCV 75.7*   < > 75.7* 75.7*  --  76.9*  --  76.3* 74.7*  PLT 63*   < > 47* 42*  --  66*  --  51* 50*   < > = values in this interval not displayed.   Basic Metabolic Panel: Recent Labs  Lab 11/18/17 0219  11/20/17 1516 11/21/17 0306 11/21/17 1000 11/22/17 0805 11/22/17 1214 11/23/17 0245 11/24/17 0223  NA 142   < > 142 142 145 137 140 136 134*  K 3.9   < > 4.0 3.7 3.7 4.7 4.2 4.4 4.0  CL 111   < > 109 108 102 101 95* 95* 93*  CO2 22   < > 20* 22  --  23  --  25 26  GLUCOSE 136*   < > 364* 230* 355* 363* 399* 335* 235*  BUN 73*   < > 92* 86* 71* 87* 77* 92* 105*  CREATININE 3.38*   < > 3.81* 3.57* 3.80* 3.77* 3.80* 4.01* 4.32*  CALCIUM 7.5*   < >  7.8* 7.5*  --  7.5*  --  7.5* 7.0*  MG  --   --   --   --   --   --   --  1.9 1.8  PHOS 4.9*  --   --   --   --   --   --   --   --    < > = values in this interval not displayed.   GFR: Estimated Creatinine Clearance: 14.6 mL/min (A) (by C-G formula based on SCr of 4.32 mg/dL (H)). Liver Function Tests: Recent Labs  Lab 11/18/17 0219 11/20/17 1516  AST  --  16  ALT  --  9*  ALKPHOS  --  38  BILITOT  --  2.7*  PROT  --  5.1*  ALBUMIN 4.0 4.4   No results for input(s): LIPASE, AMYLASE in the last 168 hours. No results for input(s): AMMONIA in the last 168 hours. Coagulation Profile: No results for input(s): INR, PROTIME in the last 168 hours. Cardiac Enzymes: No results for input(s): CKTOTAL, CKMB, CKMBINDEX, TROPONINI in the last 168 hours. BNP (last 3 results) Recent Labs    09/25/17 1614  PROBNP 380.0*   HbA1C: No results for input(s): HGBA1C in the last 72 hours. CBG: Recent Labs  Lab 11/23/17 1122 11/23/17 1623 11/23/17 2054 11/24/17 0743 11/24/17 1201  GLUCAP 443* 351* 252* 208* 273*   Lipid Profile: No results for input(s): CHOL, HDL, LDLCALC, TRIG,  CHOLHDL, LDLDIRECT in the last 72 hours. Thyroid Function Tests: No results for input(s): TSH, T4TOTAL, FREET4, T3FREE, THYROIDAB in the last 72 hours. Anemia Panel: No results for input(s): VITAMINB12, FOLATE, FERRITIN, TIBC, IRON, RETICCTPCT in the last 72 hours. Sepsis Labs: No results for input(s): PROCALCITON, LATICACIDVEN in the last 168 hours.  No results found for this or any previous visit (from the past 240 hour(s)).       Radiology Studies: US Renal  Result Date: 11/22/2017 CLINICAL DATA:  Status post renal biopsy on 11/17/2017 EXAM: RENAL / URINARY TRACT ULTRASOUND COMPLETE COMPARISON:  09/04/2017 FINDINGS: Right Kidney: Length: 8.4 cm. Minimal perinephric fluid is noted along the upper pole of the right kidney this is not in the area of previous biopsy and likely represents the normal appearance of the right kidney. No hematoma from the biopsy site is identified. Left Kidney: Length: 8.2 cm. Echogenicity within normal limits. No mass or hydronephrosis visualized. Bladder: Appears normal for degree of bladder distention. Note is made of mild ascites and mild prominence of the spleen. IMPRESSION: Mild ascites and splenic prominence consistent with the known underlying cirrhosis. Minimal perinephric fluid is noted in the lung the upper pole of the right kidney not in the area of previous biopsy. No perinephric hematoma is noted in the lower pole. Electronically Signed   By: Inez Catalina M.D.   On: 11/22/2017 20:19   Dg Abd Portable 1v  Result Date: 11/22/2017 CLINICAL DATA:  Abdominal distension EXAM: PORTABLE ABDOMEN - 1 VIEW COMPARISON:  None. FINDINGS: Scattered large and small bowel gas is noted. Mild retained fecal material is seen. No free air is noted. No definitive signs of ascites are seen. No acute bony abnormality is noted. IMPRESSION: No acute abnormality noted. Electronically Signed   By: Inez Catalina M.D.   On: 11/22/2017 17:36        Scheduled Meds: . amLODipine   10 mg Oral Daily  . calcium carbonate  2 tablet Oral Q3H  . carvedilol  12.5 mg Oral BID  WC  . cyclophosphamide  75 mg Oral Daily  . ferrous gluconate  324 mg Oral Daily  . furosemide  40 mg Oral BID  . insulin aspart  0-5 Units Subcutaneous QHS  . insulin aspart  0-9 Units Subcutaneous TID WC  . insulin glargine  22 Units Subcutaneous Q24H  . multivitamin with minerals  1 tablet Oral Daily  . pantoprazole  40 mg Oral Daily  . predniSONE  60 mg Oral Q breakfast  . sodium bicarbonate  1,300 mg Oral BID   Continuous Infusions: . sodium chloride    . sodium chloride    . anticoagulant sodium citrate    . calcium gluconate IVPB    . citrate dextrose    . citrate dextrose       LOS: 3 days    Time spent: 35 mins    William Bowell Arsenio Loader, MD [facility] Pager [phone]   If 7PM-7AM, please contact night-coverage [email or web address] Password [facility] 11/24/2017, 12:30 PM

## 2017-11-24 NOTE — Progress Notes (Signed)
William [name]  Assessment/ Plan: Pt is a 67 y.o. yo male with history of liver cirrhosis, type 2 diabetes, CHF, CKD stage IV, recently diagnosed with Goodpasture's disease and was discharged with outpatient plasma exchange readmitted for hemoptysis.  Assessment/Plan:  #Pulmonary renal syndrome/anti-GBM disease: Hemoptysis is better.  Currently on daily plasma exchange with FFP.  The kidney biopsy done on 4/15 preliminary report showing diabetic, nephropathy with secondary FSGS and not enough glomeruli to undergo IF staining for anti-GBM antibodies.  Patient received 3 days of pulse IV Solu-Medrol completed 4/20 and then back to prednisone 60 mg daily (started 4/21). -Continue cytoxan 75 mg daily (1 mg/ kg roughly).   -Repeat anti-GBM antibody pending. -Patient is having pancytopenia with drop in hemoglobin.  Receiving 2 units of red blood cell transfusion.  Very high risk of bleeding for repeat biopsy.  Recommended pulmonary consult for possible ? bronch, d/w Dr. Reesa Chew.   #Thrombocytopenia: Transfusing 1 unit of platelets today.  Try to keep platelet more than 50.  #Anemia: Patient reported decrease in hemoptysis but still dropping hemoglobin.  Cytoxan may be playing some role.  Received 2 units of blood transfusion.  Monitor lab.  #Acute kidney injury on CKD stage IV: Serum creatinine level mildly elevated.  Monitor BMP and electrolytes.  No accurate urine output monitoring.  Subjective: Seen and examined at bedside.  Denies headache, dizziness, nausea vomiting chest pain.  No urinary complaints.  His son at bedside. Objective Vital signs in last 24 hours: Vitals:   11/24/17 0831 11/24/17 0845 11/24/17 0916 11/24/17 1151  BP: (!) 166/77 (!) 172/78 (!) 161/79 (!) 163/81  Pulse: 83 83 81 80  Resp: 16 18 17    Temp: 98 F (36.7 C) 98.1 F (36.7 C) 98.2 F (36.8 C) 97.9 F (36.6 C)  TempSrc: Oral Oral Oral Oral  SpO2: 94% 95% 95% 97%  Weight:       Height:       Weight change: 1.1 kg (2 lb 6.8 oz)  Intake/Output Summary (Last 24 hours) at 11/24/2017 1308 Last data filed at 11/24/2017 1135 Gross per 24 hour  Intake 693.3 ml  Output 200 ml  Net 493.3 ml       Labs: Basic Metabolic Panel: Recent Labs  Lab 11/18/17 0219  11/22/17 0805 11/22/17 1214 11/23/17 0245 11/24/17 0223  NA 142   < > 137 140 136 134*  K 3.9   < > 4.7 4.2 4.4 4.0  CL 111   < > 101 95* 95* 93*  CO2 22   < > 23  --  25 26  GLUCOSE 136*   < > 363* 399* 335* 235*  BUN 73*   < > 87* 77* 92* 105*  CREATININE 3.38*   < > 3.77* 3.80* 4.01* 4.32*  CALCIUM 7.5*   < > 7.5*  --  7.5* 7.0*  PHOS 4.9*  --   --   --   --   --    < > = values in this interval not displayed.   Liver Function Tests: Recent Labs  Lab 11/18/17 0219 11/20/17 1516  AST  --  16  ALT  --  9*  ALKPHOS  --  38  BILITOT  --  2.7*  PROT  --  5.1*  ALBUMIN 4.0 4.4   No results for input(s): LIPASE, AMYLASE in the last 168 hours. No results for input(s): AMMONIA in the last 168 hours. CBC: Recent Labs  Lab 11/18/17 [phone]  11/20/17 1516 11/21/17 0306  11/22/17 0805 11/22/17 1214 11/23/17 0245 11/24/17 0223  WBC 3.2*   < > 6.5 4.6  --  5.1  --  3.4* 3.0*  NEUTROABS 2.6  --  5.7  --   --   --   --   --   --   HGB 9.0*   < > 8.6* 7.6*   < > 7.9* 8.2* 7.2* 6.8*  HCT 27.7*   < > 25.9* 23.7*   < > 24.0* 24.0* 22.2* 19.8*  MCV 75.7*   < > 75.7* 75.7*  --  76.9*  --  76.3* 74.7*  PLT 63*   < > 47* 42*  --  66*  --  51* 50*   < > = values in this interval not displayed.   Cardiac Enzymes: No results for input(s): CKTOTAL, CKMB, CKMBINDEX, TROPONINI in the last 168 hours. CBG: Recent Labs  Lab 11/23/17 1122 11/23/17 1623 11/23/17 2054 11/24/17 0743 11/24/17 1201  GLUCAP 443* 351* 252* 208* 273*    Iron Studies: No results for input(s): IRON, TIBC, TRANSFERRIN, FERRITIN in the last 72 hours. Studies/Results: US Renal  Result Date: 11/22/2017 CLINICAL DATA:  Status post  renal biopsy on 11/17/2017 EXAM: RENAL / URINARY TRACT ULTRASOUND COMPLETE COMPARISON:  09/04/2017 FINDINGS: Right Kidney: Length: 8.4 cm. Minimal perinephric fluid is noted along the upper pole of the right kidney this is not in the area of previous biopsy and likely represents the normal appearance of the right kidney. No hematoma from the biopsy site is identified. Left Kidney: Length: 8.2 cm. Echogenicity within normal limits. No mass or hydronephrosis visualized. Bladder: Appears normal for degree of bladder distention. [name] is made of mild ascites and mild prominence of the spleen. IMPRESSION: Mild ascites and splenic prominence consistent with the known underlying cirrhosis. Minimal perinephric fluid is noted in the lung the upper pole of the right kidney not in the area of previous biopsy. No perinephric hematoma is noted in the lower pole. Electronically Signed   By: Inez Catalina M.D.   On: 11/22/2017 20:19   Dg Abd Portable 1v  Result Date: 11/22/2017 CLINICAL DATA:  Abdominal distension EXAM: PORTABLE ABDOMEN - 1 VIEW COMPARISON:  None. FINDINGS: Scattered large and small bowel gas is noted. Mild retained fecal material is seen. No free air is noted. No definitive signs of ascites are seen. No acute bony abnormality is noted. IMPRESSION: No acute abnormality noted. Electronically Signed   By: Inez Catalina M.D.   On: 11/22/2017 17:36    Medications: Infusions: . sodium chloride    . sodium chloride    . anticoagulant sodium citrate    . calcium gluconate IVPB    . citrate dextrose    . citrate dextrose    . citrate dextrose      Scheduled Medications: . amLODipine  10 mg Oral Daily  . calcium carbonate  2 tablet Oral Q3H  . carvedilol  12.5 mg Oral BID WC  . cyclophosphamide  75 mg Oral Daily  . ferrous gluconate  324 mg Oral Daily  . furosemide  40 mg Oral BID  . insulin aspart  0-5 Units Subcutaneous QHS  . insulin aspart  0-9 Units Subcutaneous TID WC  . insulin glargine  22  Units Subcutaneous Q24H  . multivitamin with minerals  1 tablet Oral Daily  . pantoprazole  40 mg Oral Daily  . predniSONE  60 mg Oral Q breakfast  . sodium bicarbonate  1,300 mg  Oral BID    have reviewed scheduled and prn medications.  Physical Exam: General: Not in distress  Heart:regular rate rhythm S1-S2 normal Lungs: Clear bilateral, no wheezing Abdomen: Mildly distended, soft Extremities: No edema   Laasya Peyton Tanna Furry 11/24/2017,1:08 PM  LOS: 3 days

## 2017-11-24 NOTE — Progress Notes (Signed)
Inpatient Diabetes Program Recommendations  AACE/ADA: New Consensus Statement on Inpatient Glycemic Control (2015)  Target Ranges:  Prepandial:   less than 140 mg/dL      Peak postprandial:   less than 180 mg/dL (1-2 hours)      Critically ill patients:  140 - 180 mg/dL   Lab Results  Component Value Date   GLUCAP 208 (H) 11/24/2017   HGBA1C 8.1 06/20/2017    Review of Glycemic ControlResults for William [name], William [name] (MRN [id number]) as of 11/24/2017 11:28  Ref. Range 11/23/2017 07:31 11/23/2017 11:22 11/23/2017 16:23 11/23/2017 20:54 11/24/2017 07:43  Glucose-Capillary Latest Ref Range: 65 - 99 mg/dL 400 (H) 443 (H) 351 (H) 252 (H) 208 (H)   Diabetes history: Type 2 DM  Outpatient Diabetes medications: Novolin 70/30 18 units bid Current orders for Inpatient glycemic control:  Lantus 22 units q 24 hours, Novolog sensitive tid with meals and HS Prednisone 60 mg with breakfast Inpatient Diabetes Program Recommendations:    Note patient no longer receiving Solumedrol. Therefore blood sugars should improve.  Will continue to follow.   Thanks,  Adah Perl, RN, BC-ADM Inpatient Diabetes Coordinator Pager [phone] (8a-5p)

## 2017-11-25 DIAGNOSIS — N179 Acute kidney failure, unspecified: Secondary | ICD-10-CM | POA: Diagnosis not present

## 2017-11-25 DIAGNOSIS — E1122 Type 2 diabetes mellitus with diabetic chronic kidney disease: Secondary | ICD-10-CM | POA: Diagnosis not present

## 2017-11-25 DIAGNOSIS — I129 Hypertensive chronic kidney disease with stage 1 through stage 4 chronic kidney disease, or unspecified chronic kidney disease: Secondary | ICD-10-CM | POA: Diagnosis not present

## 2017-11-25 DIAGNOSIS — R042 Hemoptysis: Secondary | ICD-10-CM | POA: Diagnosis not present

## 2017-11-25 DIAGNOSIS — I5032 Chronic diastolic (congestive) heart failure: Secondary | ICD-10-CM | POA: Diagnosis not present

## 2017-11-25 DIAGNOSIS — N049 Nephrotic syndrome with unspecified morphologic changes: Secondary | ICD-10-CM | POA: Diagnosis not present

## 2017-11-25 DIAGNOSIS — N009 Acute nephritic syndrome with unspecified morphologic changes: Secondary | ICD-10-CM | POA: Diagnosis not present

## 2017-11-25 DIAGNOSIS — N184 Chronic kidney disease, stage 4 (severe): Secondary | ICD-10-CM | POA: Diagnosis not present

## 2017-11-25 DIAGNOSIS — M310 Hypersensitivity angiitis: Secondary | ICD-10-CM | POA: Diagnosis not present

## 2017-11-25 DIAGNOSIS — I130 Hypertensive heart and chronic kidney disease with heart failure and stage 1 through stage 4 chronic kidney disease, or unspecified chronic kidney disease: Secondary | ICD-10-CM | POA: Diagnosis not present

## 2017-11-25 LAB — BASIC METABOLIC PANEL
Anion gap: 14 (ref 5–15)
BUN: 103 mg/dL — AB (ref 6–20)
CALCIUM: 7.1 mg/dL — AB (ref 8.9–10.3)
CHLORIDE: 93 mmol/L — AB (ref 101–111)
CO2: 28 mmol/L (ref 22–32)
CREATININE: 3.95 mg/dL — AB (ref 0.61–1.24)
GFR calc non Af Amer: 15 mL/min — ABNORMAL LOW (ref >60)
GFR, EST AFRICAN AMERICAN: 17 mL/min — AB (ref >60)
GLUCOSE: 169 mg/dL — AB (ref 65–99)
Potassium: 3.6 mmol/L (ref 3.5–5.1)
Sodium: 135 mmol/L (ref 135–145)

## 2017-11-25 LAB — TYPE AND SCREEN
ABO/RH(D): O POS
Antibody Screen: NEGATIVE
UNIT DIVISION: 0
Unit division: 0
Unit division: 0

## 2017-11-25 LAB — CBC
HEMATOCRIT: 30 % — AB (ref 39.0–52.0)
Hemoglobin: 10.4 g/dL — ABNORMAL LOW (ref 13.0–17.0)
MCH: 26.6 pg (ref 26.0–34.0)
MCHC: 34.7 g/dL (ref 30.0–36.0)
MCV: 76.7 fL — AB (ref 78.0–100.0)
Platelets: 33 10*3/uL — ABNORMAL LOW (ref 150–400)
RBC: 3.91 MIL/uL — ABNORMAL LOW (ref 4.22–5.81)
RDW: 16.8 % — AB (ref 11.5–15.5)
WBC: 3.2 10*3/uL — ABNORMAL LOW (ref 4.0–10.5)

## 2017-11-25 LAB — POCT I-STAT, CHEM 8
BUN: 96 mg/dL — AB (ref 6–20)
BUN: 88 mg/dL — AB (ref 6–20)
CALCIUM ION: 0.88 mmol/L — AB (ref 1.15–1.40)
CALCIUM ION: 0.88 mmol/L — AB (ref 1.15–1.40)
CHLORIDE: 89 mmol/L — AB (ref 101–111)
CHLORIDE: 91 mmol/L — AB (ref 101–111)
Creatinine, Ser: 4.1 mg/dL — ABNORMAL HIGH (ref 0.61–1.24)
Creatinine, Ser: 3.9 mg/dL — ABNORMAL HIGH (ref 0.61–1.24)
GLUCOSE: 230 mg/dL — AB (ref 65–99)
GLUCOSE: 153 mg/dL — AB (ref 65–99)
HCT: 30 % — ABNORMAL LOW (ref 39.0–52.0)
HCT: 33 % — ABNORMAL LOW (ref 39.0–52.0)
Hemoglobin: 10.2 g/dL — ABNORMAL LOW (ref 13.0–17.0)
Hemoglobin: 11.2 g/dL — ABNORMAL LOW (ref 13.0–17.0)
POTASSIUM: 3.8 mmol/L (ref 3.5–5.1)
POTASSIUM: 3.6 mmol/L (ref 3.5–5.1)
Sodium: 134 mmol/L — ABNORMAL LOW (ref 135–145)
Sodium: 136 mmol/L (ref 135–145)
TCO2: 26 mmol/L (ref 22–32)
TCO2: 29 mmol/L (ref 22–32)

## 2017-11-25 LAB — GLUCOSE, CAPILLARY
GLUCOSE-CAPILLARY: 140 mg/dL — AB (ref 65–99)
GLUCOSE-CAPILLARY: 115 mg/dL — AB (ref 65–99)
GLUCOSE-CAPILLARY: 74 mg/dL (ref 65–99)
Glucose-Capillary: 217 mg/dL — ABNORMAL HIGH (ref 65–99)
Glucose-Capillary: 93 mg/dL (ref 65–99)

## 2017-11-25 LAB — THERAPEUTIC PLASMA EXCHANGE (BLOOD BANK)
PLASMA EXCHANGE: 3105
PLASMA VOLUME NEEDED: 3000
UNIT DIVISION: 0
UNIT DIVISION: 0
UNIT DIVISION: 0
UNIT DIVISION: 0
Unit division: 0
Unit division: 0
Unit division: 0
Unit division: 0
Unit division: 0
Unit division: 0
Unit division: 0
Unit division: 0

## 2017-11-25 LAB — PREPARE PLATELET PHERESIS: Unit division: 0

## 2017-11-25 LAB — BPAM RBC
BLOOD PRODUCT EXPIRATION DATE: 201905082359
BLOOD PRODUCT EXPIRATION DATE: 201905152359
Blood Product Expiration Date: 201905052359
ISSUE DATE / TIME: 201904220508
ISSUE DATE / TIME: 201904220850
ISSUE DATE / TIME: 201904221802
UNIT TYPE AND RH: 5100
Unit Type and Rh: 5100
Unit Type and Rh: 5100

## 2017-11-25 LAB — BPAM PLATELET PHERESIS
BLOOD PRODUCT EXPIRATION DATE: 201904222359
UNIT TYPE AND RH: 6200

## 2017-11-25 LAB — PROTIME-INR
INR: 1.18
PROTHROMBIN TIME: 14.9 s (ref 11.4–15.2)

## 2017-11-25 LAB — APTT: aPTT: 25 seconds (ref 24–36)

## 2017-11-25 LAB — GLOMERULAR BASEMENT MEMBRANE ANTIBODIES: GBM AB: 5 U (ref 0–20)

## 2017-11-25 LAB — MAGNESIUM: Magnesium: 1.9 mg/dL (ref 1.7–2.4)

## 2017-11-25 MED ORDER — DIPHENHYDRAMINE HCL 25 MG PO CAPS: 25.0000 mg | ORAL_CAPSULE | Freq: Four times a day (QID) | ORAL | Status: DC | PRN

## 2017-11-25 MED ORDER — CALCIUM CARBONATE ANTACID 500 MG PO CHEW
2.0000 | CHEWABLE_TABLET | ORAL | Status: AC
  Administered 2017-11-25: 400 mg via ORAL
  Filled 2017-11-25: qty 2

## 2017-11-25 MED ORDER — ACETAMINOPHEN 325 MG PO TABS
650.0000 mg | ORAL_TABLET | ORAL | Status: DC | PRN
Start: 2017-11-25 — End: 2017-11-29

## 2017-11-25 MED ORDER — ACD FORMULA A 0.73-2.45-2.2 GM/100ML VI SOLN
500.0000 mL | Status: DC
  Administered 2017-11-25: 500 mL via INTRAVENOUS

## 2017-11-25 MED ORDER — ANTICOAGULANT SODIUM CITRATE 4% (200MG/5ML) IV SOLN
5.0000 mL | Freq: Once | Status: AC
  Administered 2017-11-25: 5 mL
  Filled 2017-11-25: qty 5

## 2017-11-25 MED ORDER — SODIUM CHLORIDE 0.9 % IV SOLN
4.0000 g | Freq: Once | INTRAVENOUS | Status: AC
  Administered 2017-11-25: 4 g via INTRAVENOUS
  Filled 2017-11-25: qty 40

## 2017-11-25 MED ORDER — SODIUM CHLORIDE 0.9 % IV SOLN
Freq: Once | INTRAVENOUS | Status: AC
  Administered 2017-11-25: 13:00:00 via INTRAVENOUS

## 2017-11-25 NOTE — Progress Notes (Signed)
Good Pasture Syndrome with hemoptysis, pancytopenia, acute renal failure, on daily plasma exchange, transfused 2 units of blood, pulmonary consulted for possible bronch. Plan home when stable.

## 2017-11-25 NOTE — Telephone Encounter (Signed)
Error opening  

## 2017-11-25 NOTE — Progress Notes (Signed)
PROGRESS NOTE    William [name]  EYC:[id number] DOB: 1950/11/30 DOA: 11/19/2017 PCP: William Pounds, NP   Brief Narrative:   18 male with history of CKD stage IV, cirrhosis, diabetes mellitus type 2, diastolic congestive heart failure who was recently diagnosed with Goodpasture's disease with outpatient plasmapheresis.  He was hospitalized here from November 06, 2017 to November 18, 2017 where he was treated with Solu-Medrol, plasmapheresis and dialysis was initiated.  Renal biopsy was performed on November 17, 2017 and was discharged on Cytoxan.  During his hospitalization he also received multiple units of transfusion.  After going home patient had episodes of hemoptysis therefore returned to the hospital for further evaluation.  Upon admission his hemoglobin was noted to be 9.1, platelets were down to 47, BUN 87 and creatinine of 3.7.  Assessment & Plan:   Principal Problem:   Goodpasture's syndrome (Emily) with associated hemoptysis Active Problems:   Type II diabetes mellitus with renal manifestations (HCC)   Pancytopenia (HCC)   Acute renal failure superimposed on stage 4 chronic kidney disease (HCC)   Essential hypertension   Hemoptysis   Asymmetric edema of both lower extremities   Metabolic acidosis  Hemoptysis with normocytic anemia, resolved for now.  Goodpasture's Disease with anti GBM -Nephrology following, appreciate input. Case Discussed with Dr William [name]- prelim report shows Diabetic GN with secondary FSGS but not enough sample for IF staining.  -Solu-Medrol 1 g daily x3 completed.  Now on prednisone 60 mg daily 4/21 (tentative plans 30 days for now).  -Cont Cytoxan 83m po daily.  -Anti-GBM-sent- results are pending.  -Getting daily PLEX at nephro discretion.  -Plan to give 2 units Plts today.  -Appreciate Pulm input.   Pancytopenia Thrombocytopenia -Plan to give 2 units plt today, Keep it above >50K  Abdominal Distention -improved.   Facial Swelling; about the same -likely  from fluid and low protein state. But will keep a close eye out on this since he has a catheter in his chest wall in place with concerns of DVT.   Distended abdomen, resolved -Abdominal x-rays negative  CKD Stage III-IV -Cont to monitor Cr. He is getting HD intermittently.  -Keep a close eye on this.  Diabetes mellitus type 2 with Renal manifestations  Hyperglycemia; likely steroid-induced -Cont Lantus 22 units units, novolog 5units premeals, and ISS  Essential HTN -Cont Coreg and Norvasc 141mpo daily.   B/L LE Swelling R>L -LE dopplers - neg for DVT.   History Of Cirrhosis.  -Supportive care   DVT prophylaxis: SCDs Code Status: Full  Family Communication:  None at Bedside, patient in HD Disposition Plan: TBD  Consultants:   Nephro  Pulm  Procedures:   None  Antimicrobials:   None   Subjective: [name] have any new complaints. No acute events overnight. Had PLEX yesterday which he tolerated.   Review of Systems Otherwise negative except as per HPI, including: HEENT/EYES = negative for pain, redness, loss of vision, double vision, blurred vision, loss of hearing, sore throat, hoarseness, dysphagia Cardiovascular= negative for chest pain, palpitation, murmurs, lower extremity swelling Respiratory/lungs= negative for shortness of breath, cough, hemoptysis, wheezing, mucus production Gastrointestinal= negative for nausea, vomiting,, abdominal pain, melena, hematemesis Genitourinary= negative for Dysuria, Hematuria, Change in Urinary Frequency MSK = Negative for arthralgia, myalgias, Back Pain, Joint swelling  Neurology= Negative for headache, seizures, numbness, tingling  Psychiatry= Negative for anxiety, depression, suicidal and homocidal ideation Allergy/Immunology= Medication/Food allergy as listed  Skin= Negative for Rash, lesions, ulcers, itching  Objective: Vitals:   11/25/17 1020 11/25/17 1033 11/25/17 1042 11/25/17 1050  BP: (!) 185/65 (!)  165/90 (!) 165/76 (!) 165/86  Pulse: 86 88 81 87  Resp: (!) 21 (!) 21 20 (!) 21  Temp: (!) 97.3 F (36.3 C) (!) 97.3 F (36.3 C) (!) 97.5 F (36.4 C) (!) 97.2 F (36.2 C)  TempSrc:   Oral Oral  SpO2:   98%   Weight:      Height:        Intake/Output Summary (Last 24 hours) at 11/25/2017 1135 Last data filed at 11/24/2017 2200 Gross per 24 hour  Intake 315 ml  Output -  Net 315 ml   Filed Weights   11/22/17 0638 11/23/17 0700 11/24/17 0500  Weight: 71.8 kg (158 lb 4.6 oz) 71.2 kg (156 lb 15.5 oz) 72.3 kg (159 lb 6.3 oz)    Examination: Constitutional: NAD, calm, comfortable Eyes: PERRL, lids and conjunctivae normal ENMT: Mucous membranes are moist. Posterior pharynx clear of any exudate or lesions.Normal dentition.  Neck: normal, supple, no masses, no thyromegaly Respiratory: Bibasilar crackles Right chest wall catherter in place.  Cardiovascular: Regular rate and rhythm, no murmurs / rubs / gallops. No extremity edema. 2+ pedal pulses. No carotid bruits.  Abdomen: slightly distended but improved. no tenderness, no masses palpated. No hepatosplenomegaly. Bowel sounds positive.  Musculoskeletal: no clubbing / cyanosis. No joint deformity upper and lower extremities. Good ROM, no contractures. Normal muscle tone.  Skin: no rashes, lesions, ulcers. No induration Neurologic: CN 2-12 grossly intact. Sensation intact, DTR normal. Strength 5/5 in all 4.  Psychiatric: Normal judgment and insight. Alert and oriented x 3. Normal mood.   Data Reviewed:   CBC: Recent Labs  Lab 11/20/17 1516 11/21/17 0306  11/22/17 0805  11/23/17 0245 11/24/17 0223 11/24/17 1440 11/25/17 0239 11/25/17 0841  WBC 6.5 4.6  --  5.1  --  3.4* 3.0*  --  3.2*  --   NEUTROABS 5.7  --   --   --   --   --   --   --   --   --   HGB 8.6* 7.6*   < > 7.9*   < > 7.2* 6.8* 10.2* 10.4* 11.2*  HCT 25.9* 23.7*   < > 24.0*   < > 22.2* 19.8* 30.0* 30.0* 33.0*  MCV 75.7* 75.7*  --  76.9*  --  76.3* 74.7*  --  76.7*   --   PLT 47* 42*  --  66*  --  51* 50*  --  33*  --    < > = values in this interval not displayed.   Basic Metabolic Panel: Recent Labs  Lab 11/21/17 0306  11/22/17 0805  11/23/17 0245 11/24/17 0223 11/24/17 1440 11/25/17 0239 11/25/17 0841  NA 142   < > 137   < > 136 134* 134* 135 136  K 3.7   < > 4.7   < > 4.4 4.0 3.8 3.6 3.6  CL 108   < > 101   < > 95* 93* 89* 93* 91*  CO2 22  --  23  --  25 26  --  28  --   GLUCOSE 230*   < > 363*   < > 335* 235* 230* 169* 153*  BUN 86*   < > 87*   < > 92* 105* 96* 103* 88*  CREATININE 3.57*   < > 3.77*   < > 4.01* 4.32* 4.10* 3.95* 3.90*  CALCIUM  7.5*  --  7.5*  --  7.5* 7.0*  --  7.1*  --   MG  --   --   --   --  1.9 1.8  --  1.9  --    < > = values in this interval not displayed.   GFR: Estimated Creatinine Clearance: 16.2 mL/min (A) (by C-G formula based on SCr of 3.9 mg/dL (H)). Liver Function Tests: Recent Labs  Lab 11/20/17 1516  AST 16  ALT 9*  ALKPHOS 38  BILITOT 2.7*  PROT 5.1*  ALBUMIN 4.4   No results for input(s): LIPASE, AMYLASE in the last 168 hours. No results for input(s): AMMONIA in the last 168 hours. Coagulation Profile: Recent Labs  Lab 11/25/17 0735  INR 1.18   Cardiac Enzymes: No results for input(s): CKTOTAL, CKMB, CKMBINDEX, TROPONINI in the last 168 hours. BNP (last 3 results) Recent Labs    09/25/17 1614  PROBNP 380.0*   HbA1C: No results for input(s): HGBA1C in the last 72 hours. CBG: Recent Labs  Lab 11/24/17 0743 11/24/17 1201 11/24/17 1733 11/24/17 2212 11/25/17 0728  GLUCAP 208* 273* 232* 217* 140*   Lipid Profile: No results for input(s): CHOL, HDL, LDLCALC, TRIG, CHOLHDL, LDLDIRECT in the last 72 hours. Thyroid Function Tests: No results for input(s): TSH, T4TOTAL, FREET4, T3FREE, THYROIDAB in the last 72 hours. Anemia Panel: No results for input(s): VITAMINB12, FOLATE, FERRITIN, TIBC, IRON, RETICCTPCT in the last 72 hours. Sepsis Labs: No results for input(s):  PROCALCITON, LATICACIDVEN in the last 168 hours.  No results found for this or any previous visit (from the past 240 hour(s)).       Radiology Studies: No results found.      Scheduled Meds: . amLODipine  10 mg Oral Daily  . calcium carbonate  2 tablet Oral Q3H  . carvedilol  12.5 mg Oral BID WC  . ferrous gluconate  324 mg Oral Daily  . furosemide  40 mg Oral BID  . insulin aspart  0-5 Units Subcutaneous QHS  . insulin aspart  0-9 Units Subcutaneous TID WC  . insulin glargine  22 Units Subcutaneous Q24H  . multivitamin with minerals  1 tablet Oral Daily  . pantoprazole  40 mg Oral Daily  . predniSONE  60 mg Oral Q breakfast  . sodium bicarbonate  1,300 mg Oral BID   Continuous Infusions: . sodium chloride    . sodium chloride    . sodium chloride    . calcium gluconate IVPB 4 g (11/25/17 1124)  . citrate dextrose       LOS: 4 days    Time spent: 25 mins    Ankit Arsenio Loader, MD [facility] Pager [phone]   If 7PM-7AM, please contact night-coverage [email or web address] Password [facility] 11/25/2017, 11:35 AM

## 2017-11-25 NOTE — Progress Notes (Signed)
[facility] NEPHROLOGY PROGRESS NOTE  Assessment/ Plan: Pt is a 67 y.o. yo male with history of liver cirrhosis, type 2 diabetes, CHF, CKD stage IV, recently diagnosed with Goodpasture's disease and was discharged with outpatient plasma exchange readmitted for hemoptysis.  Assessment/Plan:  #Pulmonary renal syndrome/anti-GBM disease: Hemoptysis is improved.  Repeat anti-GBM antibody improved, 5.  Currently on daily plasma exchange with FFP since 4/18.  The kidney biopsy done on 4/15 preliminary report showing diabetic, nephropathy with secondary FSGS and not enough glomeruli to undergo IF staining for anti-GBM antibodies.  Patient received 3 days of pulse IV Solu-Medrol completed 4/20 and then back to prednisone 60 mg daily (started 4/21).  -Patient was receiving low-dose Cytoxan however holding today because of  pancytopenia worsening.  Patient is requiring red blood cell and platelet transfusion frequently. -Evaluated by pulmonologist, not planning transbronchial biopsy because of pancytopenia this time. As per Dr. Elsworth Soho (4/22), If platelet count more than 100 K pulmonary can proceed with transbronchial biopsy and bronchoalveolar lavage.  #Thrombocytopenia: Worsened today.  No sign of active bleeding.  Transfusing 2 units of platelet.    #Anemia: Received blood transfusion yesterday.  Holding Cytoxan.  Hemoglobin is stable today.  Continue to monitor.   #Acute kidney injury on CKD stage IV: Serum creatinine level stable at 3.9.  Volume status acceptable.  Potassium level 3.6.  Continue to monitor.  Serum CO2 level 28 therefore I will discontinue oral sodium bicarbonate.  Monitor urine output.  Subjective: No new event.  No hemoptysis.  No shortness of breath.  No nausea vomiting.  No change in urine output.  Son at bedside. Objective Vital signs in last 24 hours: Vitals:   11/25/17 1100 11/25/17 1130 11/25/17 1223 11/25/17 1245  BP: (!) 156/47 (!) 183/89 (!) 178/81 (!) 175/82   Pulse: 86 84 83 84  Resp: 19 18 17    Temp: 97.8 F (36.6 C) 98.1 F (36.7 C) 98 F (36.7 C) 98.3 F (36.8 C)  TempSrc: Oral Oral Oral Oral  SpO2: 98% 97% 97% 100%  Weight:      Height:       Weight change:   Intake/Output Summary (Last 24 hours) at 11/25/2017 1405 Last data filed at 11/24/2017 2200 Gross per 24 hour  Intake 315 ml  Output -  Net 315 ml       Labs: Basic Metabolic Panel: Recent Labs  Lab 11/23/17 0245 11/24/17 0223 11/24/17 1440 11/25/17 0239 11/25/17 0841  NA 136 134* 134* 135 136  K 4.4 4.0 3.8 3.6 3.6  CL 95* 93* 89* 93* 91*  CO2 25 26  --  28  --   GLUCOSE 335* 235* 230* 169* 153*  BUN 92* 105* 96* 103* 88*  CREATININE 4.01* 4.32* 4.10* 3.95* 3.90*  CALCIUM 7.5* 7.0*  --  7.1*  --    Liver Function Tests: Recent Labs  Lab 11/20/17 1516  AST 16  ALT 9*  ALKPHOS 38  BILITOT 2.7*  PROT 5.1*  ALBUMIN 4.4   No results for input(s): LIPASE, AMYLASE in the last 168 hours. No results for input(s): AMMONIA in the last 168 hours. CBC: Recent Labs  Lab 11/20/17 1516 11/21/17 0306  11/22/17 0805  11/23/17 0245 11/24/17 0223 11/24/17 1440 11/25/17 0239 11/25/17 0841  WBC 6.5 4.6  --  5.1  --  3.4* 3.0*  --  3.2*  --   NEUTROABS 5.7  --   --   --   --   --   --   --   --   --  HGB 8.6* 7.6*   < > 7.9*   < > 7.2* 6.8* 10.2* 10.4* 11.2*  HCT 25.9* 23.7*   < > 24.0*   < > 22.2* 19.8* 30.0* 30.0* 33.0*  MCV 75.7* 75.7*  --  76.9*  --  76.3* 74.7*  --  76.7*  --   PLT 47* 42*  --  66*  --  51* 50*  --  33*  --    < > = values in this interval not displayed.   Cardiac Enzymes: No results for input(s): CKTOTAL, CKMB, CKMBINDEX, TROPONINI in the last 168 hours. CBG: Recent Labs  Lab 11/24/17 1201 11/24/17 1733 11/24/17 2212 11/25/17 0728 11/25/17 1242  GLUCAP 273* 232* 217* 140* 115*    Iron Studies: No results for input(s): IRON, TIBC, TRANSFERRIN, FERRITIN in the last 72 hours. Studies/Results: No results  found.  Medications: Infusions: . sodium chloride    . sodium chloride    . citrate dextrose      Scheduled Medications: . amLODipine  10 mg Oral Daily  . carvedilol  12.5 mg Oral BID WC  . ferrous gluconate  324 mg Oral Daily  . furosemide  40 mg Oral BID  . insulin aspart  0-5 Units Subcutaneous QHS  . insulin aspart  0-9 Units Subcutaneous TID WC  . insulin glargine  22 Units Subcutaneous Q24H  . multivitamin with minerals  1 tablet Oral Daily  . pantoprazole  40 mg Oral Daily  . predniSONE  60 mg Oral Q breakfast  . sodium bicarbonate  1,300 mg Oral BID    have reviewed scheduled and prn medications.  Physical Exam: General: Not in distress, lying in bed comfortable Heart: Regular rate rhythm S1-S2 normal.  No rub Lungs: Clear bilateral, no wheezing Abdomen: Soft mildly distended  extremities: No edema   Katie Faraone Tanna Furry 11/25/2017,2:05 PM  LOS: 4 days

## 2017-11-26 ENCOUNTER — Inpatient Hospital Stay (HOSPITAL_COMMUNITY): Payer: Medicare Other

## 2017-11-26 ENCOUNTER — Encounter (HOSPITAL_COMMUNITY): Payer: Self-pay | Admitting: Nephrology

## 2017-11-26 DIAGNOSIS — E1122 Type 2 diabetes mellitus with diabetic chronic kidney disease: Secondary | ICD-10-CM | POA: Diagnosis not present

## 2017-11-26 DIAGNOSIS — I130 Hypertensive heart and chronic kidney disease with heart failure and stage 1 through stage 4 chronic kidney disease, or unspecified chronic kidney disease: Secondary | ICD-10-CM | POA: Diagnosis not present

## 2017-11-26 DIAGNOSIS — N184 Chronic kidney disease, stage 4 (severe): Secondary | ICD-10-CM | POA: Diagnosis not present

## 2017-11-26 DIAGNOSIS — M310 Hypersensitivity angiitis: Secondary | ICD-10-CM | POA: Diagnosis not present

## 2017-11-26 DIAGNOSIS — N049 Nephrotic syndrome with unspecified morphologic changes: Secondary | ICD-10-CM | POA: Diagnosis not present

## 2017-11-26 DIAGNOSIS — R042 Hemoptysis: Secondary | ICD-10-CM | POA: Diagnosis not present

## 2017-11-26 DIAGNOSIS — N009 Acute nephritic syndrome with unspecified morphologic changes: Secondary | ICD-10-CM | POA: Diagnosis not present

## 2017-11-26 DIAGNOSIS — I5032 Chronic diastolic (congestive) heart failure: Secondary | ICD-10-CM | POA: Diagnosis not present

## 2017-11-26 DIAGNOSIS — N179 Acute kidney failure, unspecified: Secondary | ICD-10-CM | POA: Diagnosis not present

## 2017-11-26 DIAGNOSIS — I129 Hypertensive chronic kidney disease with stage 1 through stage 4 chronic kidney disease, or unspecified chronic kidney disease: Secondary | ICD-10-CM | POA: Diagnosis not present

## 2017-11-26 LAB — THERAPEUTIC PLASMA EXCHANGE (BLOOD BANK)
Plasma volume needed: 3000
UNIT DIVISION: 0
UNIT DIVISION: 0
UNIT DIVISION: 0
Unit division: 0
Unit division: 0
Unit division: 0
Unit division: 0
Unit division: 0
Unit division: 0
Unit division: 0

## 2017-11-26 LAB — CBC
HEMATOCRIT: 30.8 % — AB (ref 39.0–52.0)
HEMOGLOBIN: 10.2 g/dL — AB (ref 13.0–17.0)
MCH: 25.6 pg — ABNORMAL LOW (ref 26.0–34.0)
MCHC: 33.1 g/dL (ref 30.0–36.0)
MCV: 77.2 fL — ABNORMAL LOW (ref 78.0–100.0)
Platelets: 31 10*3/uL — ABNORMAL LOW (ref 150–400)
RBC: 3.99 MIL/uL — AB (ref 4.22–5.81)
RDW: 16.6 % — ABNORMAL HIGH (ref 11.5–15.5)
WBC: 3.4 10*3/uL — ABNORMAL LOW (ref 4.0–10.5)

## 2017-11-26 LAB — BASIC METABOLIC PANEL
ANION GAP: 14 (ref 5–15)
BUN: 92 mg/dL — ABNORMAL HIGH (ref 6–20)
CALCIUM: 7.3 mg/dL — AB (ref 8.9–10.3)
CO2: 30 mmol/L (ref 22–32)
Chloride: 95 mmol/L — ABNORMAL LOW (ref 101–111)
Creatinine, Ser: 3.78 mg/dL — ABNORMAL HIGH (ref 0.61–1.24)
GFR, EST AFRICAN AMERICAN: 18 mL/min — AB (ref >60)
GFR, EST NON AFRICAN AMERICAN: 15 mL/min — AB (ref >60)
GLUCOSE: 141 mg/dL — AB (ref 65–99)
POTASSIUM: 3.8 mmol/L (ref 3.5–5.1)
Sodium: 139 mmol/L (ref 135–145)

## 2017-11-26 LAB — GLUCOSE, CAPILLARY
GLUCOSE-CAPILLARY: 114 mg/dL — AB (ref 65–99)
GLUCOSE-CAPILLARY: 193 mg/dL — AB (ref 65–99)
GLUCOSE-CAPILLARY: 147 mg/dL — AB (ref 65–99)
Glucose-Capillary: 162 mg/dL — ABNORMAL HIGH (ref 65–99)

## 2017-11-26 LAB — PREPARE PLATELET PHERESIS: Unit division: 0

## 2017-11-26 LAB — BPAM PLATELET PHERESIS
BLOOD PRODUCT EXPIRATION DATE: 201904252359
ISSUE DATE / TIME: 201904231218
Unit Type and Rh: 6200

## 2017-11-26 LAB — MAGNESIUM: MAGNESIUM: 1.9 mg/dL (ref 1.7–2.4)

## 2017-11-26 LAB — PLATELET COUNT: PLATELETS: 34 10*3/uL — AB (ref 150–400)

## 2017-11-26 MED ORDER — HYDRALAZINE HCL 20 MG/ML IJ SOLN
10.0000 mg | Freq: Once | INTRAMUSCULAR | Status: AC
  Administered 2017-11-26: 10 mg via INTRAVENOUS
  Filled 2017-11-26: qty 1

## 2017-11-26 MED ORDER — PREDNISONE 20 MG PO TABS
40.0000 mg | ORAL_TABLET | Freq: Every day | ORAL | Status: DC
  Administered 2017-11-27 – 2017-11-29 (×3): 40 mg via ORAL
  Filled 2017-11-26 (×3): qty 2

## 2017-11-26 MED ORDER — SODIUM CHLORIDE 0.9 % IV SOLN
Freq: Once | INTRAVENOUS | Status: AC
  Administered 2017-11-26: 10 mL/h via INTRAVENOUS

## 2017-11-26 NOTE — Progress Notes (Signed)
PROGRESS NOTE    [name]  NOI:[id number] DOB: 1951/05/22 DOA: 11/19/2017 PCP: Gildardo Pounds, NP   Brief Narrative:   9 male with history of CKD stage IV, cirrhosis, diabetes mellitus type 2, diastolic congestive heart failure who was recently diagnosed with Goodpasture's disease with outpatient plasmapheresis.  He was hospitalized here from November 06, 2017 to November 18, 2017 where he was treated with Solu-Medrol, plasmapheresis and dialysis was initiated.  Renal biopsy was performed on November 17, 2017 and was discharged on Cytoxan.  During his hospitalization he also received multiple units of transfusion.  After going home patient had episodes of hemoptysis therefore returned to the hospital for further evaluation.  Upon admission his hemoglobin was noted to be 9.1, platelets were down to 47, BUN 87 and creatinine of 3.7. Initially patient received PLEX and multiple transfusions but despite of it, his renal function remained stable and pancytopenic. Eventually his Biopsy results were finalized which were consistent with FSGN and Diabetic Nephropathy, Pulm was consulted as well but Bronch was on hold due to low Plt.  Eventually cytoxan was stopped.   Assessment & Plan:   Principal Problem:   Goodpasture's syndrome ([place]) with associated hemoptysis Active Problems:   Type II diabetes mellitus with renal manifestations (HCC)   Pancytopenia (HCC)   Acute renal failure superimposed on stage 4 chronic kidney disease (HCC)   Essential hypertension   Hemoptysis   Asymmetric edema of both lower extremities   Metabolic acidosis  Hemoptysis with normocytic anemia, resolved for now.  FSGN with Diabetic Nephropathy.  -Nephrology following, appreciate input. Case Discussed with Dr Hollie Salk- prelim report shows Diabetic GN with secondary FSGS but not enough sample for IF staining.  -Solu-Medrol 1 g daily x3 completed.  Now on prednisone 60 mg daily 4/21 (tentative plans 30 days for now).  -Cytoxan has  been stopped.  -PLEX stopped as well for now.  -s/p Plt tx yesterday, plan on giving 2 more units today.  -Appreciate Pulm input.   Pancytopenia Thrombocytopenia -Still low Plt, will give 2 more units today.   Abdominal Distention -AXR ordered again as his distention appears little worse.   Facial Swelling; about the same -resolved.   CKD Stage III-IV -Cont to monitor Cr.  -Keep a close eye on this.  Diabetes mellitus type 2 with Renal manifestations  Hyperglycemia; likely steroid-induced -Cont Lantus 22 units units, novolog 5units premeals, and ISS  Essential HTN -Cont Coreg and Norvasc 62m po daily.   B/L LE Swelling R>L -LE dopplers - neg for DVT.   History Of Cirrhosis.  -Supportive care   DVT prophylaxis: SCDs Code Status: Full  Family Communication:  Son at bedside  Disposition Plan: If plt remains stable will consider discharging him in next 2-3 days.   Consultants:   Nephro  Pulm  Procedures:   None  Antimicrobials:   None   Subjective: No complaints besides abd distention and watery diarrhea. No fevers, chills and other complaints.   Review of Systems Otherwise negative except as per HPI, including: HEENT/EYES = negative for pain, redness, loss of vision, double vision, blurred vision, loss of hearing, sore throat, hoarseness, dysphagia Cardiovascular= negative for chest pain, palpitation, murmurs, lower extremity swelling Respiratory/lungs= negative for shortness of breath, cough, hemoptysis, wheezing, mucus production Gastrointestinal= negative for nausea, vomiting, melena, hematemesis Genitourinary= negative for Dysuria, Hematuria, Change in Urinary Frequency MSK = Negative for arthralgia, myalgias, Back Pain, Joint swelling  Neurology= Negative for headache, seizures, numbness, tingling  Psychiatry= Negative for  anxiety, depression, suicidal and homocidal ideation Allergy/Immunology= Medication/Food allergy as listed  Skin= Negative for  Rash, lesions, ulcers, itching   Objective: Vitals:   11/25/17 2326 11/26/17 0756 11/26/17 1030 11/26/17 1045  BP: (!) 165/80 (!) 175/86 (!) 179/81 (!) 186/91  Pulse:  80 84   Resp: 14 19 (!) 24 (!) 21  Temp: 97.8 F (36.6 C) 98.9 F (37.2 C) 98.7 F (37.1 C) 97.9 F (36.6 C)  TempSrc: Oral Oral Oral Oral  SpO2: 98% 97% 98% 97%  Weight:      Height:        Intake/Output Summary (Last 24 hours) at 11/26/2017 1203 Last data filed at 11/26/2017 1030 Gross per 24 hour  Intake 725 ml  Output -  Net 725 ml   Filed Weights   11/22/17 0638 11/23/17 0700 11/24/17 0500  Weight: 71.8 kg (158 lb 4.6 oz) 71.2 kg (156 lb 15.5 oz) 72.3 kg (159 lb 6.3 oz)    Examination: Constitutional: NAD, calm, comfortable Eyes: PERRL, lids and conjunctivae normal ENMT: Mucous membranes are moist. Posterior pharynx clear of any exudate or lesions.Normal dentition.  Neck: normal, supple, no masses, no thyromegaly Respiratory: clear to auscultation bilaterally, no wheezing, no crackles. Normal respiratory effort. No accessory muscle use. Right chest wall catheter.  Cardiovascular: Regular rate and rhythm, no murmurs / rubs / gallops. No extremity edema. 2+ pedal pulses. No carotid bruits.  Abdomen: Abd is slightly distended, no tenderness, no masses palpated. No hepatosplenomegaly. Bowel sounds positive.  Musculoskeletal: no clubbing / cyanosis. No joint deformity upper and lower extremities. Good ROM, no contractures. Normal muscle tone.  Skin: no rashes, lesions, ulcers. No induration Neurologic: CN 2-12 grossly intact. Sensation intact, DTR normal. Strength 5/5 in all 4.  Psychiatric: Normal judgment and insight. Alert and oriented x 3. Normal mood.    Data Reviewed:   CBC: Recent Labs  Lab 11/20/17 1516  11/22/17 0805  11/23/17 0245 11/24/17 0223 11/24/17 1440 11/25/17 0239 11/25/17 0841 11/26/17 0214  WBC 6.5   < > 5.1  --  3.4* 3.0*  --  3.2*  --  3.4*  NEUTROABS 5.7  --   --   --   --    --   --   --   --   --   HGB 8.6*   < > 7.9*   < > 7.2* 6.8* 10.2* 10.4* 11.2* 10.2*  HCT 25.9*   < > 24.0*   < > 22.2* 19.8* 30.0* 30.0* 33.0* 30.8*  MCV 75.7*   < > 76.9*  --  76.3* 74.7*  --  76.7*  --  77.2*  PLT 47*   < > 66*  --  51* 50*  --  33*  --  31*   < > = values in this interval not displayed.   Basic Metabolic Panel: Recent Labs  Lab 11/22/17 0805  11/23/17 0245 11/24/17 0223 11/24/17 1440 11/25/17 0239 11/25/17 0841 11/26/17 0214  NA 137   < > 136 134* 134* 135 136 139  K 4.7   < > 4.4 4.0 3.8 3.6 3.6 3.8  CL 101   < > 95* 93* 89* 93* 91* 95*  CO2 23  --  25 26  --  28  --  30  GLUCOSE 363*   < > 335* 235* 230* 169* 153* 141*  BUN 87*   < > 92* 105* 96* 103* 88* 92*  CREATININE 3.77*   < > 4.01* 4.32* 4.10* 3.95* 3.90*  3.78*  CALCIUM 7.5*  --  7.5* 7.0*  --  7.1*  --  7.3*  MG  --   --  1.9 1.8  --  1.9  --  1.9   < > = values in this interval not displayed.   GFR: Estimated Creatinine Clearance: 16.7 mL/min (A) (by C-G formula based on SCr of 3.78 mg/dL (H)). Liver Function Tests: Recent Labs  Lab 11/20/17 1516  AST 16  ALT 9*  ALKPHOS 38  BILITOT 2.7*  PROT 5.1*  ALBUMIN 4.4   No results for input(s): LIPASE, AMYLASE in the last 168 hours. No results for input(s): AMMONIA in the last 168 hours. Coagulation Profile: Recent Labs  Lab 11/25/17 0735  INR 1.18   Cardiac Enzymes: No results for input(s): CKTOTAL, CKMB, CKMBINDEX, TROPONINI in the last 168 hours. BNP (last 3 results) Recent Labs    09/25/17 1614  PROBNP 380.0*   HbA1C: No results for input(s): HGBA1C in the last 72 hours. CBG: Recent Labs  Lab 11/25/17 1242 11/25/17 1701 11/25/17 2049 11/26/17 0758 11/26/17 1157  GLUCAP 115* 93 74 114* 193*   Lipid Profile: No results for input(s): CHOL, HDL, LDLCALC, TRIG, CHOLHDL, LDLDIRECT in the last 72 hours. Thyroid Function Tests: No results for input(s): TSH, T4TOTAL, FREET4, T3FREE, THYROIDAB in the last 72 hours. Anemia  Panel: No results for input(s): VITAMINB12, FOLATE, FERRITIN, TIBC, IRON, RETICCTPCT in the last 72 hours. Sepsis Labs: No results for input(s): PROCALCITON, LATICACIDVEN in the last 168 hours.  No results found for this or any previous visit (from the past 240 hour(s)).       Radiology Studies: No results found.      Scheduled Meds: . amLODipine  10 mg Oral Daily  . carvedilol  12.5 mg Oral BID WC  . ferrous gluconate  324 mg Oral Daily  . furosemide  40 mg Oral BID  . insulin aspart  0-5 Units Subcutaneous QHS  . insulin aspart  0-9 Units Subcutaneous TID WC  . insulin glargine  22 Units Subcutaneous Q24H  . multivitamin with minerals  1 tablet Oral Daily  . pantoprazole  40 mg Oral Daily  . predniSONE  60 mg Oral Q breakfast   Continuous Infusions: . sodium chloride    . sodium chloride    . citrate dextrose       LOS: 5 days    Time spent: 20 mins    Danea Manter Arsenio Loader, MD [facility] Pager [phone]   If 7PM-7AM, please contact night-coverage [email or web address] Password [facility] 11/26/2017, 12:03 PM

## 2017-11-26 NOTE — Progress Notes (Signed)
[facility] NEPHROLOGY PROGRESS NOTE  Assessment/ Plan: Pt is a 67 y.o. yo male with history of liver cirrhosis, type 2 diabetes, CHF, CKD stage IV, recently diagnosed with Goodpasture's disease and was discharged with outpatient plasma exchange readmitted for hemoptysis.  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  The final biopsy of the kidneys showed diabetic nephropathy with secondary FSGS.  Not enough glomeruli to undergo IF staining for anti GBM ab.  On reviewing his record and discussed with the patient's outpatient nephrologist, Dr. Posey Pronto, patient had mild elevation in anti-GBM antibody back in November.  Patient received plasma exchange from 4/18-4/23 this admission, discontinue further plasma exchange.  No Cytoxan because of pancytopenia.  I will lower the dose of prednisone to 40 mg with plan to taper gradually.  Serum creatinine level stable.  Continue oral Lasix.  The repeat anti-GBM antibody is low. -Evaluated by pulmonologist, not planning transbronchial biopsy because of pancytopenia this time. As per Dr. Elsworth Soho (4/22), If platelet count more than 100 K pulmonary can proceed with transbronchial biopsy and bronchoalveolar lavage.  #Thrombocytopenia: Not improving after platelet transfusion concerning for immune mediated.  Receiving another blood pressure abdomen today per primary.  No sign of bleeding.     #Anemia: Continue to hold Cytoxan.  Hemoglobin 10.2 today.  Monitor.   #Acute kidney injury on CKD stage IV: Serum creatinine level stable at 3.78.  Volume status acceptable.  Continue to monitor.  Monitor urine output.  Subjective: No new event.  Denied headache, dizziness, nausea vomiting chest pain shortness of breath.  Patient's son at bedside.  No hemoptysis Objective Vital signs in last 24 hours: Vitals:   11/26/17 1030 11/26/17 1045 11/26/17 1200 11/26/17 1225  BP: (!) 179/81 (!) 186/91 (!) 178/87 (!)  170/86  Pulse: 84  83   Resp: (!) 24 (!) 21  (!) 21  Temp: 98.7 F (37.1 C) 97.9 F (36.6 C)    TempSrc: Oral Oral    SpO2: 98% 97%    Weight:      Height:       Weight change:   Intake/Output Summary (Last 24 hours) at 11/26/2017 1258 Last data filed at 11/26/2017 1030 Gross per 24 hour  Intake 725 ml  Output -  Net 725 ml       Labs: Basic Metabolic Panel: Recent Labs  Lab 11/24/17 0223  11/25/17 0239 11/25/17 0841 11/26/17 0214  NA 134*   < > 135 136 139  K 4.0   < > 3.6 3.6 3.8  CL 93*   < > 93* 91* 95*  CO2 26  --  28  --  30  GLUCOSE 235*   < > 169* 153* 141*  BUN 105*   < > 103* 88* 92*  CREATININE 4.32*   < > 3.95* 3.90* 3.78*  CALCIUM 7.0*  --  7.1*  --  7.3*   < > = values in this interval not displayed.   Liver Function Tests: Recent Labs  Lab 11/20/17 1516  AST 16  ALT 9*  ALKPHOS 38  BILITOT 2.7*  PROT 5.1*  ALBUMIN 4.4   No results for input(s): LIPASE, AMYLASE in the last 168 hours. No results for input(s): AMMONIA in the last 168 hours. CBC: Recent Labs  Lab 11/20/17 1516  11/22/17 0805  11/23/17 0245 11/24/17 0223  11/25/17 0239 11/25/17 0841 11/26/17 0214  WBC 6.5   < > 5.1  --  3.4* 3.0*  --  3.2*  --  3.4*  NEUTROABS 5.7  --   --   --   --   --   --   --   --   --   HGB 8.6*   < > 7.9*   < > 7.2* 6.8*   < > 10.4* 11.2* 10.2*  HCT 25.9*   < > 24.0*   < > 22.2* 19.8*   < > 30.0* 33.0* 30.8*  MCV 75.7*   < > 76.9*  --  76.3* 74.7*  --  76.7*  --  77.2*  PLT 47*   < > 66*  --  51* 50*  --  33*  --  31*   < > = values in this interval not displayed.   Cardiac Enzymes: No results for input(s): CKTOTAL, CKMB, CKMBINDEX, TROPONINI in the last 168 hours. CBG: Recent Labs  Lab 11/25/17 1242 11/25/17 1701 11/25/17 2049 11/26/17 0758 11/26/17 1157  GLUCAP 115* 93 74 114* 193*    Iron Studies: No results for input(s): IRON, TIBC, TRANSFERRIN, FERRITIN in the last 72 hours. Studies/Results: No results  found.  Medications: Infusions: . sodium chloride    . sodium chloride    . citrate dextrose      Scheduled Medications: . amLODipine  10 mg Oral Daily  . carvedilol  12.5 mg Oral BID WC  . ferrous gluconate  324 mg Oral Daily  . furosemide  40 mg Oral BID  . insulin aspart  0-5 Units Subcutaneous QHS  . insulin aspart  0-9 Units Subcutaneous TID WC  . insulin glargine  22 Units Subcutaneous Q24H  . multivitamin with minerals  1 tablet Oral Daily  . pantoprazole  40 mg Oral Daily  . [START ON 11/27/2017] predniSONE  40 mg Oral Q breakfast    have reviewed scheduled and prn medications.  Physical Exam: General: Not in distress, lying in bed comfortable Heart: Regular rate rhythm S1-S2 normal.  No rubs Lungs: Clear bilateral, no wheezing Abdomen: Soft mildly distended  extremities: No edema.   Taunia Frasco Prasad Millie Forde 11/26/2017,12:58 PM  LOS: 5 days

## 2017-11-26 NOTE — Plan of Care (Signed)
Patients platlets remain low, requiring 2 units of platlets today.  Son at bedside assisting in interpreting for patient.  Patient still having diarrhea, immodium given will continue to monitor

## 2017-11-26 NOTE — Progress Notes (Signed)
2 units of platelets in pt, redraw was 34 up from 31. Paged MD to ask if they wanted any other interventions tonight. Made family aware.

## 2017-11-27 ENCOUNTER — Telehealth: Payer: Self-pay

## 2017-11-27 DIAGNOSIS — E1122 Type 2 diabetes mellitus with diabetic chronic kidney disease: Secondary | ICD-10-CM | POA: Diagnosis not present

## 2017-11-27 DIAGNOSIS — I5032 Chronic diastolic (congestive) heart failure: Secondary | ICD-10-CM | POA: Diagnosis not present

## 2017-11-27 DIAGNOSIS — I130 Hypertensive heart and chronic kidney disease with heart failure and stage 1 through stage 4 chronic kidney disease, or unspecified chronic kidney disease: Secondary | ICD-10-CM | POA: Diagnosis not present

## 2017-11-27 DIAGNOSIS — I129 Hypertensive chronic kidney disease with stage 1 through stage 4 chronic kidney disease, or unspecified chronic kidney disease: Secondary | ICD-10-CM | POA: Diagnosis not present

## 2017-11-27 DIAGNOSIS — N184 Chronic kidney disease, stage 4 (severe): Secondary | ICD-10-CM | POA: Diagnosis not present

## 2017-11-27 DIAGNOSIS — N049 Nephrotic syndrome with unspecified morphologic changes: Secondary | ICD-10-CM | POA: Diagnosis not present

## 2017-11-27 DIAGNOSIS — R042 Hemoptysis: Secondary | ICD-10-CM | POA: Diagnosis not present

## 2017-11-27 DIAGNOSIS — N179 Acute kidney failure, unspecified: Secondary | ICD-10-CM | POA: Diagnosis not present

## 2017-11-27 DIAGNOSIS — M310 Hypersensitivity angiitis: Secondary | ICD-10-CM | POA: Diagnosis not present

## 2017-11-27 DIAGNOSIS — N009 Acute nephritic syndrome with unspecified morphologic changes: Secondary | ICD-10-CM | POA: Diagnosis not present

## 2017-11-27 LAB — BPAM PLATELET PHERESIS
Blood Product Expiration Date: 201904242359
Blood Product Expiration Date: 201904242359
ISSUE DATE / TIME: 201904241410
ISSUE DATE / TIME: 201904241017
UNIT TYPE AND RH: 6200
Unit Type and Rh: 6200

## 2017-11-27 LAB — BASIC METABOLIC PANEL
ANION GAP: 15 (ref 5–15)
BUN: 92 mg/dL — ABNORMAL HIGH (ref 6–20)
CHLORIDE: 95 mmol/L — AB (ref 101–111)
CO2: 28 mmol/L (ref 22–32)
Calcium: 6.8 mg/dL — ABNORMAL LOW (ref 8.9–10.3)
Creatinine, Ser: 3.89 mg/dL — ABNORMAL HIGH (ref 0.61–1.24)
GFR calc non Af Amer: 15 mL/min — ABNORMAL LOW (ref >60)
GFR, EST AFRICAN AMERICAN: 17 mL/min — AB (ref >60)
GLUCOSE: 184 mg/dL — AB (ref 65–99)
Potassium: 3.7 mmol/L (ref 3.5–5.1)
Sodium: 138 mmol/L (ref 135–145)

## 2017-11-27 LAB — MAGNESIUM: MAGNESIUM: 1.9 mg/dL (ref 1.7–2.4)

## 2017-11-27 LAB — CBC
HCT: 32 % — ABNORMAL LOW (ref 39.0–52.0)
Hemoglobin: 10.7 g/dL — ABNORMAL LOW (ref 13.0–17.0)
MCH: 26.2 pg (ref 26.0–34.0)
MCHC: 33.4 g/dL (ref 30.0–36.0)
MCV: 78.4 fL (ref 78.0–100.0)
Platelets: 33 10*3/uL — ABNORMAL LOW (ref 150–400)
RBC: 4.08 MIL/uL — AB (ref 4.22–5.81)
RDW: 17.2 % — ABNORMAL HIGH (ref 11.5–15.5)
WBC: 5 10*3/uL (ref 4.0–10.5)

## 2017-11-27 LAB — GLUCOSE, CAPILLARY
GLUCOSE-CAPILLARY: 228 mg/dL — AB (ref 65–99)
GLUCOSE-CAPILLARY: 366 mg/dL — AB (ref 65–99)
Glucose-Capillary: 171 mg/dL — ABNORMAL HIGH (ref 65–99)
Glucose-Capillary: 189 mg/dL — ABNORMAL HIGH (ref 65–99)

## 2017-11-27 LAB — PREPARE PLATELET PHERESIS
UNIT DIVISION: 0
Unit division: 0

## 2017-11-27 LAB — MISC LABCORP TEST (SEND OUT): LABCORP TEST CODE: 9985

## 2017-11-27 MED ORDER — SENNOSIDES-DOCUSATE SODIUM 8.6-50 MG PO TABS
2.0000 | ORAL_TABLET | Freq: Two times a day (BID) | ORAL | Status: AC
  Administered 2017-11-27 – 2017-11-28 (×2): 2 via ORAL
  Filled 2017-11-27 (×3): qty 2

## 2017-11-27 MED ORDER — DOCUSATE SODIUM 100 MG PO CAPS
100.0000 mg | ORAL_CAPSULE | Freq: Two times a day (BID) | ORAL | Status: AC
  Administered 2017-11-27 – 2017-11-28 (×2): 100 mg via ORAL
  Filled 2017-11-27 (×4): qty 1

## 2017-11-27 NOTE — Progress Notes (Signed)
[name] PROGRESS NOTE  Assessment/ Plan: Pt is a 67 y.o. yo male with history of liver cirrhosis, type 2 diabetes, CHF, CKD stage IV, recently diagnosed with Goodpasture's disease and was discharged with outpatient plasma exchange readmitted for hemoptysis.  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  The final biopsy of the kidneys showed diabetic nephropathy with secondary FSGS.  Not enough glomeruli to undergo IF staining for anti GBM ab.  On reviewing his record and discussion with the patient's outpatient nephrologist, William [name], patient had mild elevation in anti-GBM antibody back in November.  Patient received plasma exchange from 4/18-4/23 this admission, discontinued further plasma exchange.   No Cytoxan because of pancytopenia.   Iowered the dose of prednisone to 40 mg with plan to taper gradually.  Serum creatinine level is stable. Continue oral Lasix.  The repeat anti-GBM antibody is low. -Evaluated by pulmonologist, not planning transbronchial biopsy because of pancytopenia this time. As per Dr. Elsworth [name] (4/22), If platelet count more than 100 K pulmonary can proceed with transbronchial biopsy and bronchoalveolar lavage.  #Thrombocytopenia: Not improving after platelet transfusion concerning for immune mediated.  I recommended hematology reevaluation.  He has no sign of bleeding.  Discussed with Dr. Reesa [name].  #Anemia:off Cytoxan, hemoglobin is stable at 10.7.  #Acute kidney injury on CKD stage IV: Serum creatinine level stable .  Volume status acceptable.  Continue to monitor.  Monitor urine output.  Subjective: No new event.  No further hemoptysis.  Denies headache, dizziness, nausea vomiting chest pain shortness of breath. Objective Vital signs in last 24 hours: Vitals:   11/26/17 1430 11/26/17 1627 11/26/17 1709 11/27/17 0716  BP: (!) 155/76 (!) 146/75 (!) 152/80 (!) 174/91  Pulse:  87 94 84   Resp:  19 18 17   Temp: 98.2 F (36.8 C) 98.8 F (37.1 C) 98.6 F (37 C) 98.1 F (36.7 C)  TempSrc: Oral Oral Oral Oral  SpO2: 94% 98% 94%   Weight:      Height:       Weight change:   Intake/Output Summary (Last 24 hours) at 11/27/2017 0901 Last data filed at 11/26/2017 1800 Gross per 24 hour  Intake 1049 ml  Output -  Net 1049 ml       Labs: Basic Metabolic Panel: Recent Labs  Lab 11/25/17 0239 11/25/17 0841 11/26/17 0214 11/27/17 0416  NA 135 136 139 138  K 3.6 3.6 3.8 3.7  CL 93* 91* 95* 95*  CO2 28  --  30 28  GLUCOSE 169* 153* 141* 184*  BUN 103* 88* 92* 92*  CREATININE 3.95* 3.90* 3.78* 3.89*  CALCIUM 7.1*  --  7.3* 6.8*   Liver Function Tests: Recent Labs  Lab 11/20/17 1516  AST 16  ALT 9*  ALKPHOS 38  BILITOT 2.7*  PROT 5.1*  ALBUMIN 4.4   No results for input(s): LIPASE, AMYLASE in the last 168 hours. No results for input(s): AMMONIA in the last 168 hours. CBC: Recent Labs  Lab 11/20/17 1516  11/23/17 0245 11/24/17 0223  11/25/17 0239 11/25/17 0841 11/26/17 0214 11/26/17 1930 11/27/17 0416  WBC 6.5   < > 3.4* 3.0*  --  3.2*  --  3.4*  --  5.0  NEUTROABS 5.7  --   --   --   --   --   --   --   --   --   HGB 8.6*   < > 7.2* 6.8*   < >  10.4* 11.2* 10.2*  --  10.7*  HCT 25.9*   < > 22.2* 19.8*   < > 30.0* 33.0* 30.8*  --  32.0*  MCV 75.7*   < > 76.3* 74.7*  --  76.7*  --  77.2*  --  78.4  PLT 47*   < > 51* 50*  --  33*  --  31* 34* 33*   < > = values in this interval not displayed.   Cardiac Enzymes: No results for input(s): CKTOTAL, CKMB, CKMBINDEX, TROPONINI in the last 168 hours. CBG: Recent Labs  Lab 11/26/17 0758 11/26/17 1157 11/26/17 1627 11/26/17 2139 11/27/17 0717  GLUCAP 114* 193* 162* 147* 171*    Iron Studies: No results for input(s): IRON, TIBC, TRANSFERRIN, FERRITIN in the last 72 hours. Studies/Results: Dg Abd 1 View  Result Date: 11/26/2017 CLINICAL DATA:  Abdominal distension EXAM: ABDOMEN - 1 VIEW  COMPARISON:  None. FINDINGS: There is moderate stool in the colon. There is no appreciable bowel dilatation or air-fluid level to suggest bowel obstruction. No evident free air. There is a benign exostosis arising from the midportion of the right iliac crest laterally. IMPRESSION: No bowel obstruction or free air evident.  Moderate stool in colon. Electronically Signed   By: William [name] M.D.   On: 11/26/2017 13:22    Medications: Infusions: . sodium chloride    . sodium chloride    . citrate dextrose      Scheduled Medications: . amLODipine  10 mg Oral Daily  . carvedilol  12.5 mg Oral BID WC  . docusate sodium  100 mg Oral BID  . ferrous gluconate  324 mg Oral Daily  . furosemide  40 mg Oral BID  . insulin aspart  0-5 Units Subcutaneous QHS  . insulin aspart  0-9 Units Subcutaneous TID WC  . insulin glargine  22 Units Subcutaneous Q24H  . multivitamin with minerals  1 tablet Oral Daily  . pantoprazole  40 mg Oral Daily  . predniSONE  40 mg Oral Q breakfast  . senna-docusate  2 tablet Oral BID    have reviewed scheduled and prn medications.  Physical Exam: General: Not in distress, lying in bed comfortable Heart: Regular rate rhythm S1-S2 normal.  No rubs Lungs: Clear bilateral, no wheezing Abdomen: Soft mildly distended  extremities: No lower extremity edema unchanged   William [name] 11/27/2017,9:01 AM  LOS: 6 days

## 2017-11-27 NOTE — Telephone Encounter (Signed)
Dr. Reesa Chew with [facility] would like Dr. Julien Nordmann to call him for a consult for this patient regarding his persistent thrombocytopenia.   His cell number is [phone]

## 2017-11-27 NOTE — Progress Notes (Signed)
PROGRESS NOTE    William [name]  XIP:[id number] DOB: May 25, 1951 DOA: 11/19/2017 PCP: Gildardo Pounds, NP   Brief Narrative:   66 male with history of CKD stage IV, cirrhosis, diabetes mellitus type 2, diastolic congestive heart failure who was recently diagnosed with Goodpasture's disease with outpatient plasmapheresis.  He was hospitalized here from November 06, 2017 to November 18, 2017 where he was treated with Solu-Medrol, plasmapheresis and dialysis was initiated.  Renal biopsy was performed on November 17, 2017 and was discharged on Cytoxan.  During his hospitalization he also received multiple units of transfusion.  After going home patient had episodes of hemoptysis therefore returned to the hospital for further evaluation.  Upon admission his hemoglobin was noted to be 9.1, platelets were down to 47, BUN 87 and creatinine of 3.7. Initially patient received PLEX and multiple transfusions but despite of it, his renal function remained stable and pancytopenic. Eventually his Biopsy results were finalized which were consistent with FSGN and Diabetic Nephropathy, Pulm was consulted as well but Bronch was on hold due to low Plt.  Eventually cytoxan was stopped.  His thrombocytopenia persists, could be secondary to autoimmune process versus Cytoxan still around the system from poor renal clearance.  We will get hematology opinion.  Assessment & Plan:   Principal Problem:   Goodpasture's syndrome ([place]) with associated hemoptysis Active Problems:   Type II diabetes mellitus with renal manifestations (HCC)   Pancytopenia (HCC)   Acute renal failure superimposed on stage 4 chronic kidney disease (HCC)   Essential hypertension   Hemoptysis   Asymmetric edema of both lower extremities   Metabolic acidosis  Hemoptysis with normocytic anemia, resolved for now.  FSGN with Diabetic Nephropathy.  -Nephrology following, appreciate input. Case Discussed with Dr Hollie Salk- prelim report shows Diabetic GN with  secondary FSGS but not enough sample for IF staining.  -Solu-Medrol 1 g daily x3 completed.  Now on prednisone 60 mg daily 4/21 (tentative plans 30 days for now).  -Cytoxan has been stopped  11/25/2017 -PLEX stopped as well for now.  -Last plasma exchange was 4/23.  Received 2 units of platelets last on 4/24. -Appreciate Pulm input.-  Pancytopenia Thrombocytopenia -Received 2 more units of platelets yesterday but platelets still remain low.  Concerns of autoimmune disease versus persistent effect of Cytoxan.  I have left message for hematology, Dr. Earlie Server to discuss the case as patient has seen him in the past.  We will get their opinion before further transfusing.  Patient is already on steroids at this time.  Abdominal Distention -Abdominal x-ray shows moderate amount of stool in colon therefore started bowel regimen.  Imodium has been stopped  Facial Swelling; about the same -resolved.   CKD Stage III-IV -Cont to monitor Cr.  -Keep a close eye on this.  Diabetes mellitus type 2 with Renal manifestations  Hyperglycemia; likely steroid-induced -Cont Lantus 22 units units, novolog 5units premeals, and ISS.  Blood glucose in acceptable range  Essential HTN -Cont Coreg and Norvasc 29m po daily.   B/L LE Swelling R>L -LE dopplers - neg for DVT.   History Of Cirrhosis.  -Supportive care   DVT prophylaxis: SCDs Code Status: Full  Family Communication:  Son at bedside  Disposition Plan: I maintain inpatient stay until his platelets have stabilized and we get further input from hematology.  Consultants:   Nephro  Pulm  Hematology consult pending  Procedures:   None  Antimicrobials:   None   Subjective: Reports of slight abdominal distention this  morning otherwise no new complaints.  No signs of bleeding  Review of Systems Otherwise negative except as per HPI, including: HEENT/EYES = negative for pain, redness, loss of vision, double vision, blurred vision, loss  of hearing, sore throat, hoarseness, dysphagia Cardiovascular= negative for chest pain, palpitation, murmurs, lower extremity swelling Respiratory/lungs= negative for shortness of breath, cough, hemoptysis, wheezing, mucus production Gastrointestinal= negative for nausea, vomiting,, abdominal pain, melena, hematemesis Genitourinary= negative for Dysuria, Hematuria, Change in Urinary Frequency MSK = Negative for arthralgia, myalgias, Back Pain, Joint swelling  Neurology= Negative for headache, seizures, numbness, tingling  Psychiatry= Negative for anxiety, depression, suicidal and homocidal ideation Allergy/Immunology= Medication/Food allergy as listed  Skin= Negative for Rash, lesions, ulcers, itching    Objective: Vitals:   11/26/17 1627 11/26/17 1709 11/27/17 0716 11/27/17 1001  BP: (!) 146/75 (!) 152/80 (!) 174/91 (!) 172/84  Pulse: 87 94 84   Resp: 19 18 17    Temp: 98.8 F (37.1 C) 98.6 F (37 C) 98.1 F (36.7 C)   TempSrc: Oral Oral Oral   SpO2: 98% 94%    Weight:      Height:        Intake/Output Summary (Last 24 hours) at 11/27/2017 1106 Last data filed at 11/26/2017 1800 Gross per 24 hour  Intake 1039 ml  Output -  Net 1039 ml   Filed Weights   11/22/17 0638 11/23/17 0700 11/24/17 0500  Weight: 71.8 kg (158 lb 4.6 oz) 71.2 kg (156 lb 15.5 oz) 72.3 kg (159 lb 6.3 oz)    Examination: Constitutional: NAD, calm, comfortable Eyes: PERRL, lids and conjunctivae normal ENMT: Mucous membranes are moist. Posterior pharynx clear of any exudate or lesions.Normal dentition.  Neck: normal, supple, no masses, no thyromegaly Respiratory: clear to auscultation bilaterally, no wheezing, no crackles. Normal respiratory effort. No accessory muscle use.  Cardiovascular: Regular rate and rhythm, no murmurs / rubs / gallops. No extremity edema. 2+ pedal pulses. No carotid bruits.  Abdomen: Abdomen is slightly distended, no tenderness, no masses palpated. No hepatosplenomegaly. Bowel  sounds positive.  Musculoskeletal: no clubbing / cyanosis. No joint deformity upper and lower extremities. Good ROM, no contractures. Normal muscle tone.  Skin: no rashes, lesions, ulcers. No induration Neurologic: CN 2-12 grossly intact. Sensation intact, DTR normal. Strength 5/5 in all 4.  Psychiatric: Normal judgment and insight. Alert and oriented x 3. Normal mood.     Data Reviewed:   CBC: Recent Labs  Lab 11/20/17 1516  11/23/17 0245 11/24/17 0223 11/24/17 1440 11/25/17 0239 11/25/17 0841 11/26/17 0214 11/26/17 1930 11/27/17 0416  WBC 6.5   < > 3.4* 3.0*  --  3.2*  --  3.4*  --  5.0  NEUTROABS 5.7  --   --   --   --   --   --   --   --   --   HGB 8.6*   < > 7.2* 6.8* 10.2* 10.4* 11.2* 10.2*  --  10.7*  HCT 25.9*   < > 22.2* 19.8* 30.0* 30.0* 33.0* 30.8*  --  32.0*  MCV 75.7*   < > 76.3* 74.7*  --  76.7*  --  77.2*  --  78.4  PLT 47*   < > 51* 50*  --  33*  --  31* 34* 33*   < > = values in this interval not displayed.   Basic Metabolic Panel: Recent Labs  Lab 11/23/17 0245 11/24/17 0223 11/24/17 1440 11/25/17 0239 11/25/17 0841 11/26/17 0214 11/27/17 0416  NA  136 134* 134* 135 136 139 138  K 4.4 4.0 3.8 3.6 3.6 3.8 3.7  CL 95* 93* 89* 93* 91* 95* 95*  CO2 25 26  --  28  --  30 28  GLUCOSE 335* 235* 230* 169* 153* 141* 184*  BUN 92* 105* 96* 103* 88* 92* 92*  CREATININE 4.01* 4.32* 4.10* 3.95* 3.90* 3.78* 3.89*  CALCIUM 7.5* 7.0*  --  7.1*  --  7.3* 6.8*  MG 1.9 1.8  --  1.9  --  1.9 1.9   GFR: Estimated Creatinine Clearance: 16.2 mL/min (A) (by C-G formula based on SCr of 3.89 mg/dL (H)). Liver Function Tests: Recent Labs  Lab 11/20/17 1516  AST 16  ALT 9*  ALKPHOS 38  BILITOT 2.7*  PROT 5.1*  ALBUMIN 4.4   No results for input(s): LIPASE, AMYLASE in the last 168 hours. No results for input(s): AMMONIA in the last 168 hours. Coagulation Profile: Recent Labs  Lab 11/25/17 0735  INR 1.18   Cardiac Enzymes: No results for input(s): CKTOTAL,  CKMB, CKMBINDEX, TROPONINI in the last 168 hours. BNP (last 3 results) Recent Labs    09/25/17 1614  PROBNP 380.0*   HbA1C: No results for input(s): HGBA1C in the last 72 hours. CBG: Recent Labs  Lab 11/26/17 0758 11/26/17 1157 11/26/17 1627 11/26/17 2139 11/27/17 0717  GLUCAP 114* 193* 162* 147* 171*   Lipid Profile: No results for input(s): CHOL, HDL, LDLCALC, TRIG, CHOLHDL, LDLDIRECT in the last 72 hours. Thyroid Function Tests: No results for input(s): TSH, T4TOTAL, FREET4, T3FREE, THYROIDAB in the last 72 hours. Anemia Panel: No results for input(s): VITAMINB12, FOLATE, FERRITIN, TIBC, IRON, RETICCTPCT in the last 72 hours. Sepsis Labs: No results for input(s): PROCALCITON, LATICACIDVEN in the last 168 hours.  No results found for this or any previous visit (from the past 240 hour(s)).       Radiology Studies: Dg Abd 1 View  Result Date: 11/26/2017 CLINICAL DATA:  Abdominal distension EXAM: ABDOMEN - 1 VIEW COMPARISON:  None. FINDINGS: There is moderate stool in the colon. There is no appreciable bowel dilatation or air-fluid level to suggest bowel obstruction. No evident free air. There is a benign exostosis arising from the midportion of the right iliac crest laterally. IMPRESSION: No bowel obstruction or free air evident.  Moderate stool in colon. Electronically Signed   By: Lowella Grip III M.D.   On: 11/26/2017 13:22        Scheduled Meds: . amLODipine  10 mg Oral Daily  . carvedilol  12.5 mg Oral BID WC  . docusate sodium  100 mg Oral BID  . ferrous gluconate  324 mg Oral Daily  . furosemide  40 mg Oral BID  . insulin aspart  0-5 Units Subcutaneous QHS  . insulin aspart  0-9 Units Subcutaneous TID WC  . insulin glargine  22 Units Subcutaneous Q24H  . multivitamin with minerals  1 tablet Oral Daily  . pantoprazole  40 mg Oral Daily  . predniSONE  40 mg Oral Q breakfast  . senna-docusate  2 tablet Oral BID   Continuous Infusions: . sodium  chloride    . sodium chloride    . citrate dextrose       LOS: 6 days    Time spent: 25 mins    Lilly Gasser Arsenio Loader, MD [facility] Pager [phone]   If 7PM-7AM, please contact night-coverage [email or web address] Password [facility] 11/27/2017, 11:06 AM

## 2017-11-28 ENCOUNTER — Inpatient Hospital Stay (HOSPITAL_COMMUNITY): Payer: Medicare Other

## 2017-11-28 ENCOUNTER — Encounter (HOSPITAL_COMMUNITY): Payer: Self-pay | Admitting: Radiology

## 2017-11-28 ENCOUNTER — Encounter (HOSPITAL_COMMUNITY): Payer: Medicaid Other

## 2017-11-28 DIAGNOSIS — R918 Other nonspecific abnormal finding of lung field: Secondary | ICD-10-CM

## 2017-11-28 DIAGNOSIS — E1122 Type 2 diabetes mellitus with diabetic chronic kidney disease: Secondary | ICD-10-CM | POA: Diagnosis not present

## 2017-11-28 DIAGNOSIS — N049 Nephrotic syndrome with unspecified morphologic changes: Secondary | ICD-10-CM | POA: Diagnosis not present

## 2017-11-28 DIAGNOSIS — N184 Chronic kidney disease, stage 4 (severe): Secondary | ICD-10-CM | POA: Diagnosis not present

## 2017-11-28 DIAGNOSIS — N009 Acute nephritic syndrome with unspecified morphologic changes: Secondary | ICD-10-CM | POA: Diagnosis not present

## 2017-11-28 DIAGNOSIS — M310 Hypersensitivity angiitis: Principal | ICD-10-CM

## 2017-11-28 DIAGNOSIS — R042 Hemoptysis: Secondary | ICD-10-CM | POA: Diagnosis not present

## 2017-11-28 DIAGNOSIS — Z452 Encounter for adjustment and management of vascular access device: Secondary | ICD-10-CM | POA: Diagnosis not present

## 2017-11-28 DIAGNOSIS — N179 Acute kidney failure, unspecified: Secondary | ICD-10-CM | POA: Diagnosis not present

## 2017-11-28 DIAGNOSIS — I129 Hypertensive chronic kidney disease with stage 1 through stage 4 chronic kidney disease, or unspecified chronic kidney disease: Secondary | ICD-10-CM | POA: Diagnosis not present

## 2017-11-28 DIAGNOSIS — I130 Hypertensive heart and chronic kidney disease with heart failure and stage 1 through stage 4 chronic kidney disease, or unspecified chronic kidney disease: Secondary | ICD-10-CM | POA: Diagnosis not present

## 2017-11-28 DIAGNOSIS — I5032 Chronic diastolic (congestive) heart failure: Secondary | ICD-10-CM | POA: Diagnosis not present

## 2017-11-28 HISTORY — PX: IR REMOVAL TUN CV CATH W/O FL: IMG2289

## 2017-11-28 LAB — CBC
HEMATOCRIT: 30.2 % — AB (ref 39.0–52.0)
Hemoglobin: 10.3 g/dL — ABNORMAL LOW (ref 13.0–17.0)
MCH: 26.6 pg (ref 26.0–34.0)
MCHC: 34.1 g/dL (ref 30.0–36.0)
MCV: 78 fL (ref 78.0–100.0)
PLATELETS: 28 10*3/uL — AB (ref 150–400)
RBC: 3.87 MIL/uL — AB (ref 4.22–5.81)
RDW: 17.3 % — ABNORMAL HIGH (ref 11.5–15.5)
WBC: 4.3 10*3/uL (ref 4.0–10.5)

## 2017-11-28 LAB — BASIC METABOLIC PANEL
Anion gap: 13 (ref 5–15)
BUN: 93 mg/dL — AB (ref 6–20)
CHLORIDE: 97 mmol/L — AB (ref 101–111)
CO2: 28 mmol/L (ref 22–32)
CREATININE: 3.84 mg/dL — AB (ref 0.61–1.24)
Calcium: 6.5 mg/dL — ABNORMAL LOW (ref 8.9–10.3)
GFR calc non Af Amer: 15 mL/min — ABNORMAL LOW (ref >60)
GFR, EST AFRICAN AMERICAN: 17 mL/min — AB (ref >60)
Glucose, Bld: 222 mg/dL — ABNORMAL HIGH (ref 65–99)
Potassium: 4 mmol/L (ref 3.5–5.1)
Sodium: 138 mmol/L (ref 135–145)

## 2017-11-28 LAB — MAGNESIUM: Magnesium: 2 mg/dL (ref 1.7–2.4)

## 2017-11-28 LAB — GLUCOSE, CAPILLARY
GLUCOSE-CAPILLARY: 173 mg/dL — AB (ref 65–99)
Glucose-Capillary: 189 mg/dL — ABNORMAL HIGH (ref 65–99)
Glucose-Capillary: 214 mg/dL — ABNORMAL HIGH (ref 65–99)
Glucose-Capillary: 80 mg/dL (ref 65–99)
Glucose-Capillary: 129 mg/dL — ABNORMAL HIGH (ref 65–99)

## 2017-11-28 MED ORDER — LIDOCAINE HCL 1 % IJ SOLN
INTRAMUSCULAR | Status: AC
  Filled 2017-11-28: qty 20

## 2017-11-28 MED ORDER — CHLORHEXIDINE GLUCONATE 4 % EX LIQD
CUTANEOUS | Status: AC
  Filled 2017-11-28: qty 15

## 2017-11-28 MED ORDER — ROMIPLOSTIM 250 MCG ~~LOC~~ SOLR
2.0000 ug/kg | Freq: Once | SUBCUTANEOUS | Status: AC
  Administered 2017-11-28: 150 ug via SUBCUTANEOUS
  Filled 2017-11-28: qty 0.3

## 2017-11-28 MED ORDER — AMINOCAPROIC ACID 500 MG PO TABS
500.0000 mg | ORAL_TABLET | Freq: Three times a day (TID) | ORAL | Status: DC
  Administered 2017-11-28 – 2017-11-29 (×3): 500 mg via ORAL
  Filled 2017-11-28 (×4): qty 1

## 2017-11-28 NOTE — Progress Notes (Signed)
[name] NEPHROLOGY PROGRESS NOTE  Assessment/ Plan: Pt is a 67 y.o. yo male with history of liver cirrhosis, type 2 diabetes, CHF, CKD stage IV, recently diagnosed with Goodpasture's disease and was discharged with outpatient plasma exchange readmitted for hemoptysis.  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  The final biopsy of the kidneys showed diabetic nephropathy with secondary FSGS.  Not enough glomeruli to undergo IF staining for anti GBM ab. Patient received plasma exchange from 4/18-4/23 this admission, discontinued further plasma exchange.   No Cytoxan because of pancytopenia.   continue prednisone to 40 mg with plan to taper gradually.  Serum creatinine level is stable. Continue oral Lasix.  The repeat anti-GBM antibody is low. Plan to remove tunneled catheter. Elevated BUN is also due to use of prednisone. He has no uremic symptoms. He needs to follow up with Dr. Posey Pronto from [facility] in a week after discharge from the hospital. I will sign off for now. Call us with question. dw Dr. Maylene Roes from [facility].  #Thrombocytopenia: Not improving after platelet transfusion concerning for immune mediated.  I recommended hematology reevaluation.  He has no sign of bleeding.    #Anemia:off Cytoxan, hemoglobin is stable at 10.7.  #Acute kidney injury on CKD stage IV: Serum creatinine level stable .  Volume status acceptable.   Subjective: No new event.  No N/V/abdomen pain. No chest pain or SOB Objective Vital signs in last 24 hours: Vitals:   11/27/17 1832 11/27/17 2319 11/28/17 0600 11/28/17 0804  BP: (!) 146/84 (!) 161/84  (!) 164/85  Pulse: 86 83  81  Resp:  18  16  Temp:  98.2 F (36.8 C)  98.3 F (36.8 C)  TempSrc:  Oral  Oral  SpO2:  100%  95%  Weight:   75 kg (165 lb 5.5 oz)   Height:       Weight change:  No intake or output data in the 24 hours ending 11/28/17 1216     Labs: Basic Metabolic  Panel: Recent Labs  Lab 11/26/17 0214 11/27/17 0416 11/28/17 0321  NA 139 138 138  K 3.8 3.7 4.0  CL 95* 95* 97*  CO2 30 28 28   GLUCOSE 141* 184* 222*  BUN 92* 92* 93*  CREATININE 3.78* 3.89* 3.84*  CALCIUM 7.3* 6.8* 6.5*   Liver Function Tests: No results for input(s): AST, ALT, ALKPHOS, BILITOT, PROT, ALBUMIN in the last 168 hours. No results for input(s): LIPASE, AMYLASE in the last 168 hours. No results for input(s): AMMONIA in the last 168 hours. CBC: Recent Labs  Lab 11/24/17 0223  11/25/17 0239  11/26/17 0214 11/26/17 1930 11/27/17 0416 11/28/17 0321  WBC 3.0*  --  3.2*  --  3.4*  --  5.0 4.3  HGB 6.8*   < > 10.4*   < > 10.2*  --  10.7* 10.3*  HCT 19.8*   < > 30.0*   < > 30.8*  --  32.0* 30.2*  MCV 74.7*  --  76.7*  --  77.2*  --  78.4 78.0  PLT 50*  --  33*  --  31* 34* 33* 28*   < > = values in this interval not displayed.   Cardiac Enzymes: No results for input(s): CKTOTAL, CKMB, CKMBINDEX, TROPONINI in the last 168 hours. CBG: Recent Labs  Lab 11/27/17 1821 11/27/17 2200 11/28/17 0641 11/28/17 0720 11/28/17 1207  GLUCAP 366* 189* 214* 189* 173*    Iron Studies: No results for  input(s): IRON, TIBC, TRANSFERRIN, FERRITIN in the last 72 hours. Studies/Results: Dg Abd 1 View  Result Date: 11/26/2017 CLINICAL DATA:  Abdominal distension EXAM: ABDOMEN - 1 VIEW COMPARISON:  None. FINDINGS: There is moderate stool in the colon. There is no appreciable bowel dilatation or air-fluid level to suggest bowel obstruction. No evident free air. There is a benign exostosis arising from the midportion of the right iliac crest laterally. IMPRESSION: No bowel obstruction or free air evident.  Moderate stool in colon. Electronically Signed   By: Lowella Grip III M.D.   On: 11/26/2017 13:22    Medications: Infusions: . sodium chloride    . sodium chloride    . citrate dextrose      Scheduled Medications: . amLODipine  10 mg Oral Daily  . carvedilol  12.5 mg  Oral BID WC  . docusate sodium  100 mg Oral BID  . ferrous gluconate  324 mg Oral Daily  . furosemide  40 mg Oral BID  . insulin aspart  0-5 Units Subcutaneous QHS  . insulin aspart  0-9 Units Subcutaneous TID WC  . insulin glargine  22 Units Subcutaneous Q24H  . multivitamin with minerals  1 tablet Oral Daily  . pantoprazole  40 mg Oral Daily  . predniSONE  40 mg Oral Q breakfast  . senna-docusate  2 tablet Oral BID    have reviewed scheduled and prn medications.  Physical Exam: General: NAD Heart: RRR, s1s2 nl, no rub Lungs: CTAB, no wheeze Abdomen: Soft mildly distended  extremities: No lower extremity edema   [name] 11/28/2017,12:16 PM  LOS: 7 days

## 2017-11-28 NOTE — Progress Notes (Signed)
PROGRESS NOTE    Senan Urey  ZOX:[id number] DOB: 28-Jan-1951 DOA: 11/19/2017 PCP: Gildardo Pounds, NP     Brief Narrative:  Rayshun Kandler is a 41 male with history of CKD stage IV, non-alcoholic cirrhosis, diabetes mellitus type 2, diastolic congestive heart failure who was recently diagnosed with Goodpasture's disease and treated with Cytoxan and outpatient plasmapheresis.  He was hospitalized here from November 06, 2017 to November 18, 2017 where he was treated with Solu-Medrol, plasmapheresis and dialysis was initiated.  Renal biopsy was performed on November 17, 2017 and was discharged on Cytoxan.  During his hospitalization, he also received multiple units of transfusion.  After going home, patient had episodes of hemoptysis therefore returned to the hospital on April 17 for further evaluation.  Upon admission, his hemoglobin was noted to be 9.1, platelets were down to 47, BUN 87 and creatinine of 3.7. Initially patient received PLEX and multiple transfusions but despite of it, his renal function remained stable and pancytopenic. Eventually his biopsy results were finalized which were consistent with FSGN and Diabetic Nephropathy. Pulmonology was consulted as well but bronch was on hold due to thrombocytopenia. Eventually cytoxan was stopped. He was started on steroids.   Assessment & Plan:   Principal Problem:   Goodpasture's syndrome ([place]) with associated hemoptysis Active Problems:   Type II diabetes mellitus with renal manifestations (HCC)   Pancytopenia (HCC)   Acute renal failure superimposed on stage 4 chronic kidney disease (HCC)   Essential hypertension   Hemoptysis   Asymmetric edema of both lower extremities   Metabolic acidosis   Hemoptysis and renal failure, pulmonary-renal syndrome -Nephrology following, appreciate input. Final biopsy showed diabetic nephropathy with secondary FSGS -Received plasma exchange 4/18-4/23 and now stopped -Stopped cytoxan on 4/23 -Solu-Medrol 1 g  daily x3 completed.  Now on prednisone 90m daily with slow taper over next 30 days -Cr stable currently, continue lasix  -Discussed with Dr. BCarolin Sicks Patient to follow up with Dr. PPosey Prontoin 1 week from discharge   Acute on chronic pancytopenia -Patient has had pancytopenia over the past 2 years. Has followed with Dr. MJulien Nordmann Previously thought to be reactive. He also has hx of cirrhosis as well as splenomegaly.  -Dr. MJulien Nordmannnot in office today; spoke with Dr. EArelia Sneddonwho is covering physician on call.  Patient's thrombocytopenia could be secondary to consumptive etiology, Cytoxan, other.  He had recommended 1 dose of Nplate as well as starting Amicar 500 mg 3 times daily. -Patient is to follow-up with Dr. MJulien Nordmannin office next week for repeat CBC  Diabetes mellitus type 2 with nephropathy and hyperglycemia  -Continue Lantus, sliding scale insulin  Acute kidney injury on CKD stage IV -Creatinine stable, follow-up with Dr. PPosey Pronto Essential HTN -Continue Coreg and Norvasc  B/L LE Swelling R>L -LE dopplers - neg for DVT.   History of cirrhosis -No significant history of alcohol use   DVT prophylaxis: SCD Code Status: Full Family Communication: Son at bedside Disposition Plan: Pending improvement of platelet, will initiate NPlate and Amicar today. Likely discharge 4/27 if tolerates medications well    Consultants:   Nephrology  Pulmonology  Spoke with Dr. EMarin Olpfrom hematology    Subjective: Patient seen with son at bedside who provides translation.  Patient has no acute complaints today.  No chest pain or abdominal pain, nausea or vomiting.  He denies any further hemoptysis.  Denies any blood loss.  Objective: Vitals:   11/27/17 1832 11/27/17 2319 11/28/17 0600 11/28/17 0804  BP: (Marland Kitchen  146/84 (!) 161/84  (!) 164/85  Pulse: 86 83  81  Resp:  18  16  Temp:  98.2 F (36.8 C)  98.3 F (36.8 C)  TempSrc:  Oral  Oral  SpO2:  100%  95%  Weight:   75 kg (165 lb 5.5  oz)   Height:       No intake or output data in the 24 hours ending 11/28/17 1328 Filed Weights   11/23/17 0700 11/24/17 0500 11/28/17 0600  Weight: 71.2 kg (156 lb 15.5 oz) 72.3 kg (159 lb 6.3 oz) 75 kg (165 lb 5.5 oz)    Examination:  General exam: Appears calm and comfortable  Respiratory system: Crackles right lung base. Respiratory effort normal. Cardiovascular system: S1 & S2 heard, RRR. No JVD, murmurs, rubs, gallops or clicks. No pedal edema. Gastrointestinal system: Abdomen is nondistended, soft and nontender. No organomegaly or masses felt. Normal bowel sounds heard. Central nervous system: Alert and oriented. No focal neurological deficits. Extremities: Symmetric 5 x 5 power. Skin: No rashes, lesions or ulcers Psychiatry: Judgement and insight appear normal. Mood & affect appropriate.   Data Reviewed: I have personally reviewed following labs and imaging studies  CBC: Recent Labs  Lab 11/24/17 0223  11/25/17 0239 11/25/17 0841 11/26/17 0214 11/26/17 1930 11/27/17 0416 11/28/17 0321  WBC 3.0*  --  3.2*  --  3.4*  --  5.0 4.3  HGB 6.8*   < > 10.4* 11.2* 10.2*  --  10.7* 10.3*  HCT 19.8*   < > 30.0* 33.0* 30.8*  --  32.0* 30.2*  MCV 74.7*  --  76.7*  --  77.2*  --  78.4 78.0  PLT 50*  --  33*  --  31* 34* 33* 28*   < > = values in this interval not displayed.   Basic Metabolic Panel: Recent Labs  Lab 11/24/17 0223  11/25/17 0239 11/25/17 0841 11/26/17 0214 11/27/17 0416 11/28/17 0321  NA 134*   < > 135 136 139 138 138  K 4.0   < > 3.6 3.6 3.8 3.7 4.0  CL 93*   < > 93* 91* 95* 95* 97*  CO2 26  --  28  --  30 28 28   GLUCOSE 235*   < > 169* 153* 141* 184* 222*  BUN 105*   < > 103* 88* 92* 92* 93*  CREATININE 4.32*   < > 3.95* 3.90* 3.78* 3.89* 3.84*  CALCIUM 7.0*  --  7.1*  --  7.3* 6.8* 6.5*  MG 1.8  --  1.9  --  1.9 1.9 2.0   < > = values in this interval not displayed.   GFR: Estimated Creatinine Clearance: 17.9 mL/min (A) (by C-G formula based on  SCr of 3.84 mg/dL (H)). Liver Function Tests: No results for input(s): AST, ALT, ALKPHOS, BILITOT, PROT, ALBUMIN in the last 168 hours. No results for input(s): LIPASE, AMYLASE in the last 168 hours. No results for input(s): AMMONIA in the last 168 hours. Coagulation Profile: Recent Labs  Lab 11/25/17 0735  INR 1.18   Cardiac Enzymes: No results for input(s): CKTOTAL, CKMB, CKMBINDEX, TROPONINI in the last 168 hours. BNP (last 3 results) Recent Labs    09/25/17 1614  PROBNP 380.0*   HbA1C: No results for input(s): HGBA1C in the last 72 hours. CBG: Recent Labs  Lab 11/27/17 1821 11/27/17 2200 11/28/17 0641 11/28/17 0720 11/28/17 1207  GLUCAP 366* 189* 214* 189* 173*   Lipid Profile: No results for input(s):  CHOL, HDL, LDLCALC, TRIG, CHOLHDL, LDLDIRECT in the last 72 hours. Thyroid Function Tests: No results for input(s): TSH, T4TOTAL, FREET4, T3FREE, THYROIDAB in the last 72 hours. Anemia Panel: No results for input(s): VITAMINB12, FOLATE, FERRITIN, TIBC, IRON, RETICCTPCT in the last 72 hours. Sepsis Labs: No results for input(s): PROCALCITON, LATICACIDVEN in the last 168 hours.  No results found for this or any previous visit (from the past 240 hour(s)).     Radiology Studies: No results found.    Scheduled Meds: . aminocaproic acid  500 mg Oral Q8H  . amLODipine  10 mg Oral Daily  . carvedilol  12.5 mg Oral BID WC  . docusate sodium  100 mg Oral BID  . ferrous gluconate  324 mg Oral Daily  . furosemide  40 mg Oral BID  . insulin aspart  0-5 Units Subcutaneous QHS  . insulin aspart  0-9 Units Subcutaneous TID WC  . insulin glargine  22 Units Subcutaneous Q24H  . multivitamin with minerals  1 tablet Oral Daily  . predniSONE  40 mg Oral Q breakfast  . romiPLOStim  2 mcg/kg Subcutaneous Once  . senna-docusate  2 tablet Oral BID   Continuous Infusions: . sodium chloride    . sodium chloride    . citrate dextrose       LOS: 7 days    Time spent: 45  minutes   Dessa Phi, DO [facility] [email or web address] Password [email or web address] 11/28/2017, 1:28 PM

## 2017-11-28 NOTE — Procedures (Signed)
Successful removal of tunneled (R)IJ plasmapheresis catheter. No complications.  Ascencion Dike PA-C Interventional Radiology 11/28/2017 2:28 PM

## 2017-11-28 NOTE — Progress Notes (Signed)
67 year old man from [place] with acute renal failure with renal biopsy showing acute glomerulonephritis with FSGS but not enough specimen to run immunofluorescence for GBM.  He has been treated with plasmapheresis and Cytoxan.  Hemoptysis dates back to 07/2017. ANCA neg, antiGBM low positive CT chest 09/2017 shows relatively diffuse groundglass and airspace disease with cardiomegaly and bilateral effusions Cirrhosis has also been noted on a CT scan  His current issue is  pancytopenia.  Hemoptysis has resolved. Plasmapheresis catheter was removed today He is being treated with Nplate and Amicar. We will arrange for outpatient follow-up in 2 weeks and if thrombocytopenia is improved then, can proceed with transbronchial biopsy  Discussed with son who is in agreement, outpatient appointment was made.  PCCM available as needed.   Leanna Sato Elsworth Soho MD [phone]

## 2017-11-28 NOTE — Progress Notes (Signed)
CRITICAL VALUE ALERT  Critical Value:  Platelets 28  Date & Time Notified:  11/28/2017 4:41 AM   Provider Notified: X. Blount  Orders Received/Actions taken:

## 2017-11-29 DIAGNOSIS — M310 Hypersensitivity angiitis: Secondary | ICD-10-CM | POA: Diagnosis not present

## 2017-11-29 DIAGNOSIS — N179 Acute kidney failure, unspecified: Secondary | ICD-10-CM | POA: Diagnosis not present

## 2017-11-29 DIAGNOSIS — I130 Hypertensive heart and chronic kidney disease with heart failure and stage 1 through stage 4 chronic kidney disease, or unspecified chronic kidney disease: Secondary | ICD-10-CM | POA: Diagnosis not present

## 2017-11-29 DIAGNOSIS — I5032 Chronic diastolic (congestive) heart failure: Secondary | ICD-10-CM | POA: Diagnosis not present

## 2017-11-29 DIAGNOSIS — N009 Acute nephritic syndrome with unspecified morphologic changes: Secondary | ICD-10-CM | POA: Diagnosis not present

## 2017-11-29 DIAGNOSIS — R042 Hemoptysis: Secondary | ICD-10-CM | POA: Diagnosis not present

## 2017-11-29 LAB — BASIC METABOLIC PANEL
Anion gap: 12 (ref 5–15)
BUN: 97 mg/dL — ABNORMAL HIGH (ref 6–20)
CHLORIDE: 96 mmol/L — AB (ref 101–111)
CO2: 29 mmol/L (ref 22–32)
Calcium: 6.5 mg/dL — ABNORMAL LOW (ref 8.9–10.3)
Creatinine, Ser: 3.78 mg/dL — ABNORMAL HIGH (ref 0.61–1.24)
GFR calc Af Amer: 18 mL/min — ABNORMAL LOW (ref >60)
GFR calc non Af Amer: 15 mL/min — ABNORMAL LOW (ref >60)
Glucose, Bld: 191 mg/dL — ABNORMAL HIGH (ref 65–99)
POTASSIUM: 4.3 mmol/L (ref 3.5–5.1)
SODIUM: 137 mmol/L (ref 135–145)

## 2017-11-29 LAB — GLUCOSE, CAPILLARY: Glucose-Capillary: 180 mg/dL — ABNORMAL HIGH (ref 65–99)

## 2017-11-29 LAB — CBC
HEMATOCRIT: 29.9 % — AB (ref 39.0–52.0)
Hemoglobin: 9.8 g/dL — ABNORMAL LOW (ref 13.0–17.0)
MCH: 25.6 pg — AB (ref 26.0–34.0)
MCHC: 32.8 g/dL (ref 30.0–36.0)
MCV: 78.1 fL (ref 78.0–100.0)
Platelets: 28 10*3/uL — CL (ref 150–400)
RBC: 3.83 MIL/uL — AB (ref 4.22–5.81)
RDW: 17.1 % — AB (ref 11.5–15.5)
WBC: 3.7 10*3/uL — AB (ref 4.0–10.5)

## 2017-11-29 MED ORDER — PREDNISONE 20 MG PO TABS: 40.0000 mg | ORAL_TABLET | Freq: Every day | ORAL | 0 refills | Status: DC

## 2017-11-29 MED ORDER — AMINOCAPROIC ACID 500 MG PO TABS: 500.0000 mg | ORAL_TABLET | Freq: Three times a day (TID) | ORAL | 0 refills | Status: DC

## 2017-11-29 NOTE — Discharge Summary (Signed)
Physician Discharge Summary  William [name] SEG:[id number] DOB: [birth year] DOA: 11/19/2017  PCP: Gildardo Pounds, NP  Admit date: 11/19/2017 Discharge date: 11/29/2017  Admitted From: Home Disposition:  Home  Recommendations for Outpatient Follow-up:  1. Follow up with PCP in 1 week 2. Follow up with Dr. Julien Nordmann, Hematology, as soon as possible  3. Follow up with Dr. Posey Pronto, Nephrology, as scheduled on 4/29. Need to taper steroids slowly. Will leave taper dosing to Nephrology.  4. Follow up with Dr. Elsworth Soho in 2 weeks  5. Please obtain BMP/CBC in 1 week   Discharge Condition: Stable CODE STATUS: Full  Diet recommendation: Carb modified   Brief/Interim Summary: William [name] is a 31 male with history of CKD stage IV, non-alcoholic cirrhosis, diabetes mellitus type 2, diastolic congestive heart failure who was recently diagnosed with Goodpasture's disease and treated with Cytoxan and outpatient plasmapheresis. He was hospitalized here from November 06, 2017 to November 18, 2017 where he was treated with Solu-Medrol, plasmapheresis and dialysis was initiated. Renal biopsy was performed on November 17, 2017 and was discharged on Cytoxan. During his hospitalization, he also received multiple units of transfusion. After going home, patient had episodes of hemoptysis therefore returned to the hospital on April 17 for further evaluation. Upon admission, his hemoglobin was noted to be 9.1, platelets were down to 47, BUN 87 and creatinine of 3.7. Initially patient received PLEX and multiple transfusions but despite of it, his renal function remained stable and pancytopenic. Eventually his biopsy results were finalized which were consistent with FSGN and Diabetic Nephropathy. Pulmonology was consulted as well but bronch was on hold due to thrombocytopenia. Eventually cytoxan was stopped.He was started on steroids. Cr remained stable and tunneled catheter removed as Nephrology did not feel he required any additional  plasma exchange. Case was discussed with oncology on call; patient was given NPlate and Amicar. He will need close follow up with Dr. Julien Nordmann after discharge. On day of discharge, he denied any further hemoptysis or any other sign of bleeding. Platelet has been lower than his baseline but stable.   Discharge Diagnoses:  Principal Problem:   Goodpasture's syndrome ([place]) with associated hemoptysis Active Problems:   Type II diabetes mellitus with renal manifestations (HCC)   Pancytopenia (HCC)   Acute renal failure superimposed on stage 4 chronic kidney disease (HCC)   Essential hypertension   Hemoptysis   Asymmetric edema of both lower extremities   Metabolic acidosis   Pulmonary infiltrates   Hemoptysis and renal failure, pulmonary-renal syndrome -Nephrology following, appreciate input. Final biopsy showed diabetic nephropathy with secondary FSGS -Received plasma exchange 4/18-4/23 and now stopped -Stopped cytoxan on 4/23 -Solu-Medrol 1 g daily x3 completed. Now on prednisone 22m daily with slow taper over next 30 days -Cr stable currently, continue lasix  -Discussed with Dr. BCarolin Sicks Patient to follow up with Dr. PPosey Pronto  Acute on chronic pancytopenia -Patient has had pancytopenia over the past 2 years. Has followed with Dr. MJulien Nordmann Previously thought to be reactive. He also has hx of cirrhosis as well as splenomegaly.  -Dr. MJulien Nordmannnot in office; spoke with Dr. EArelia Sneddon4/26 who is covering physician on call.  Patient's thrombocytopenia could be secondary to consumptive etiology, Cytoxan vs other.  He had recommended 1 dose of Nplate as well as starting Amicar 500 mg 3 times daily. -Patient is to follow-up with Dr. MJulien Nordmannin office next week for repeat CBC  Diabetes mellitus type 2 with nephropathy and hyperglycemia  -Continue insulin   Acute kidney  injury on CKD stage IV -Creatinine stable, follow-up with Dr. Posey Pronto  Essential HTN -Continue Coreg and Norvasc  B/L LE  Swelling R>L -LE dopplers - neg for DVT.   History of cirrhosis -No significant history of alcohol use    Discharge Instructions  Discharge Instructions    Call MD for:   Complete by:  As directed    Any sign of bleeding   Call MD for:  difficulty breathing, headache or visual disturbances   Complete by:  As directed    Call MD for:  extreme fatigue   Complete by:  As directed    Call MD for:  hives   Complete by:  As directed    Call MD for:  persistant dizziness or light-headedness   Complete by:  As directed    Call MD for:  persistant nausea and vomiting   Complete by:  As directed    Call MD for:  severe uncontrolled pain   Complete by:  As directed    Call MD for:  temperature >100.4   Complete by:  As directed    Discharge instructions   Complete by:  As directed    You were cared for by a hospitalist during your hospital stay. If you have any questions about your discharge medications or the care you received while you were in the hospital after you are discharged, you can call the unit and asked to speak with the hospitalist on call if the hospitalist that took care of you is not available. Once you are discharged, your primary care physician will handle any further medical issues. Please note that NO REFILLS for any discharge medications will be authorized once you are discharged, as it is imperative that you return to your primary care physician (or establish a relationship with a primary care physician if you do not have one) for your aftercare needs so that they can reassess your need for medications and monitor your lab values.   Increase activity slowly   Complete by:  As directed      Allergies as of 11/29/2017      Reactions   Heparin    Patient is Muslim and is not permitted Pork derative due to religious belief)      Medication List    STOP taking these medications   cyclophosphamide 25 MG capsule Commonly known as:  CYTOXAN   pantoprazole 40 MG  tablet Commonly known as:  PROTONIX     TAKE these medications   aminocaproic acid 500 MG tablet Commonly known as:  AMICAR Take 1 tablet (500 mg total) by mouth every 8 (eight) hours.   amLODipine 10 MG tablet Commonly known as:  NORVASC Take 1 tablet (10 mg total) daily by mouth.   carvedilol 12.5 MG tablet Commonly known as:  COREG Take 12.5 mg by mouth 2 (two) times daily with a meal.   dextromethorphan-guaiFENesin 30-600 MG 12hr tablet Commonly known as:  MUCINEX DM Take 1 tablet by mouth 2 (two) times daily as needed for cough.   furosemide 40 MG tablet Commonly known as:  LASIX Take 1 tablet (40 mg total) by mouth 2 (two) times daily.   insulin NPH-regular Human (70-30) 100 UNIT/ML injection Commonly known as:  NOVOLIN 70/30 Inject 18 Units into the skin 2 (two) times daily with a meal.   IRON 27 240 (27 FE) MG tablet Generic drug:  ferrous gluconate Take 1 tablet by mouth daily.   multivitamin with minerals tablet Take 1  tablet by mouth daily.   predniSONE 20 MG tablet Commonly known as:  DELTASONE Take 2 tablets (40 mg total) by mouth daily with breakfast. Start taking on:  11/30/2017 What changed:  how much to take   sodium bicarbonate 650 MG tablet Take 2 tablets (1,300 mg total) by mouth 2 (two) times daily.      Follow-up Information    Rigoberto Noel, MD Follow up on 12/09/2017.   Specialty:  Pulmonary Disease Why:  3-45 pm Contact information: [address] [place] [address] [phone]        Gildardo Pounds, NP. Schedule an appointment as soon as possible for a visit in 1 week(s).   Specialty:  Nurse Practitioner Contact information: [place] [address] [phone]        Curt Bears, MD. Call on 12/01/2017.   Specialty:  Oncology Why:  Make appointment for hospital follow up as soon as possible  Contact information: [place] [address] [phone]        Elmarie Shiley,  MD. Go on 12/01/2017.   Specialty:  Nephrology Contact information: [place] [address] [phone]          Allergies  Allergen Reactions  . Heparin     Patient is Muslim and is not permitted Pork derative due to religious belief)    Consultations:  Nephrology  Pulmonology  Spoke with Dr. Marin Olp from hematology   Procedures/Studies: Dg Chest 2 View  Result Date: 11/19/2017 CLINICAL DATA:  Chest pain EXAM: CHEST - 2 VIEW COMPARISON:  11/11/2017 FINDINGS: Bilateral airspace disease with interval improvement. No effusion. Dual lumen right jugular central venous catheter tip in the SVC unchanged IMPRESSION: Interval improvement in bilateral airspace disease. Electronically Signed   By: Franchot Gallo M.D.   On: 11/19/2017 20:48   Dg Chest 2 View  Result Date: 11/06/2017 CLINICAL DATA:  Cough, coughing up blood EXAM: CHEST - 2 VIEW COMPARISON:  09/18/2017 FINDINGS: Diffuse bilateral interstitial and alveolar airspace opacities. No pleural effusion or pneumothorax. Stable cardiomediastinal silhouette. No aggressive osseous lesion. IMPRESSION: Bilateral interstitial and alveolar airspace opacities. Differential diagnosis includes pulmonary edema versus multi lobar pneumonia. Electronically Signed   By: Kathreen Devoid   On: 11/06/2017 18:35   Dg Abd 1 View  Result Date: 11/26/2017 CLINICAL DATA:  Abdominal distension EXAM: ABDOMEN - 1 VIEW COMPARISON:  None. FINDINGS: There is moderate stool in the colon. There is no appreciable bowel dilatation or air-fluid level to suggest bowel obstruction. No evident free air. There is a benign exostosis arising from the midportion of the right iliac crest laterally. IMPRESSION: No bowel obstruction or free air evident.  Moderate stool in colon. Electronically Signed   By: Lowella Grip III M.D.   On: 11/26/2017 13:22   Ct Chest Wo Contrast  Result Date: 11/20/2017 CLINICAL DATA:  Hemoptysis EXAM: CT CHEST WITHOUT CONTRAST  TECHNIQUE: Multidetector CT imaging of the chest was performed following the standard protocol without IV contrast. COMPARISON:  11/19/2017 chest x-ray.  Chest CT 11/07/2017 FINDINGS: Cardiovascular: Scattered coronary artery and aortic calcifications. Cardiomegaly. No evidence of aortic aneurysm. Small pericardial effusion. Mediastinum/Nodes: Scattered small mediastinal lymph nodes, likely reactive. No mediastinal, hilar, or axillary adenopathy. Lungs/Pleura: Diffuse bilateral airspace opacities, similar to prior CT. Trace left pleural effusion. Bronchial wall thickening noted in the lower lobes, stable. Upper Abdomen: Changes of cirrhosis. Spleen appears enlarged but is incompletely visualized. Musculoskeletal: Chest wall soft tissues are unremarkable.  No acute bony abnormality. IMPRESSION: Diffuse bilateral airspace disease, most confluent in the lower lobes but also noted in the upper lobes. Findings are similar to prior CT. Bronchial wall thickening in the lower lobes. Findings could reflect edema, hemorrhage, or infection. Small left pleural effusion and pericardial effusion. Cardiomegaly. Coronary artery disease, aortic atherosclerosis. Changes of cirrhosis. Suspected splenomegaly although the spleen is not imaged in its entirety. Electronically Signed   By: Rolm Baptise M.D.   On: 11/20/2017 10:43   US Renal  Result Date: 11/22/2017 CLINICAL DATA:  Status post renal biopsy on 11/17/2017 EXAM: RENAL / URINARY TRACT ULTRASOUND COMPLETE COMPARISON:  09/04/2017 FINDINGS: Right Kidney: Length: 8.4 cm. Minimal perinephric fluid is noted along the upper pole of the right kidney this is not in the area of previous biopsy and likely represents the normal appearance of the right kidney. No hematoma from the biopsy site is identified. Left Kidney: Length: 8.2 cm. Echogenicity within normal limits. No mass or hydronephrosis visualized. Bladder: Appears normal for degree of bladder distention. Note is made of mild  ascites and mild prominence of the spleen. IMPRESSION: Mild ascites and splenic prominence consistent with the known underlying cirrhosis. Minimal perinephric fluid is noted in the lung the upper pole of the right kidney not in the area of previous biopsy. No perinephric hematoma is noted in the lower pole. Electronically Signed   By: Inez Catalina M.D.   On: 11/22/2017 20:19   Ct Chest High Resolution  Result Date: 11/10/2017 CLINICAL DATA:  Cough, ongoing hemoptysis, rapidly progressive kidney failure. Shortness of breath. EXAM: CT CHEST WITHOUT CONTRAST TECHNIQUE: Multidetector CT imaging of the chest was performed following the standard protocol without intravenous contrast. High resolution imaging of the lungs, as well as inspiratory and expiratory imaging, was performed. COMPARISON:  09/17/2017. FINDINGS: Cardiovascular: Atherosclerotic calcification of the arterial vasculature, including coronary arteries. Heart is enlarged. No pericardial effusion. Mediastinum/Nodes: Mediastinal lymph nodes are not enlarged by CT size criteria. Hilar regions are difficult to evaluate without IV contrast. No axillary adenopathy. Esophagus is grossly unremarkable. Distal periesophageal lymph nodes are not enlarged by CT size criteria. Lungs/Pleura: Patchy bilateral ground-glass with septal thickening, some of which is new in the right upper lobe. Associated mild architectural distortion, peribronchial thickening and bronchiectasis in the lower lobes. Mild peribronchovascular consolidation in both lower lobes as well. Tiny right pleural effusion, decreased. Small left pleural effusion, also decreased. Upper Abdomen: Liver margin is irregular. Visualized portions of the liver, gallbladder, adrenal glands and right kidney are otherwise grossly unremarkable. Spleen appears enlarged but is incompletely imaged. Visualized portions of the stomach and bowel are grossly unremarkable. Upper abdominal lymph nodes are not enlarged by CT  size criteria. Musculoskeletal: No worrisome lytic or sclerotic lesions. IMPRESSION: 1. Patchy bilateral ground-glass with septal thickening, some of which is new in the right upper lobe. Associated areas of new architectural distortion. Findings may be due to a combination of pulmonary hemorrhage superimposed on evolving postinflammatory fibrosis. Chronic hypersensitivity pneumonitis is not excluded. 2. Small bilateral pleural effusions, left greater than right, decreased from prior. 3. Cirrhosis. 4. Spleen appears enlarged but is incompletely imaged. 5. Aortic atherosclerosis (ICD10-170.0). Coronary artery calcification. Electronically Signed   By: Lorin Picket M.D.   On: 11/10/2017 09:57   Ir Fluoro Guide Cv Line Right  Result Date: 11/11/2017 CLINICAL DATA:  Renal failure requiring hemodialysis. EXAM: TUNNELED CENTRAL VENOUS HEMODIALYSIS CATHETER PLACEMENT WITH ULTRASOUND AND FLUOROSCOPIC GUIDANCE ANESTHESIA/SEDATION: 2.0 mg IV Versed; 100 mcg IV Fentanyl. Total  Moderate Sedation Time:   20 minutes. The patient's level of consciousness and physiologic status were continuously monitored during the procedure by Radiology nursing. MEDICATIONS: 2 g IV Ancef. FLUOROSCOPY TIME:  24 seconds.  1.5 mGy. PROCEDURE: The procedure, risks, benefits, and alternatives were explained to the patient. Questions regarding the procedure were encouraged and answered. The patient understands and consents to the procedure. A timeout was performed prior to initiating the procedure. The right neck and chest were prepped with chlorhexidine in a sterile fashion, and a sterile drape was applied covering the operative field. Maximum barrier sterile technique with sterile gowns and gloves were used for the procedure. Local anesthesia was provided with 1% lidocaine. Ultrasound was used to confirm patency of the right internal jugular vein. After creating a small venotomy incision, a 21 gauge needle was advanced into the right internal  jugular vein under direct, real-time ultrasound guidance. Ultrasound image documentation was performed. After securing guidewire access, an 8 Fr dilator was placed. A J-wire was kinked to measure appropriate catheter length. A Palindrome tunneled hemodialysis catheter measuring 19 cm from tip to cuff was chosen for placement. This was tunneled in a retrograde fashion from the chest wall to the venotomy incision. At the venotomy, serial dilatation was performed and a 16 Fr peel-away sheath was placed over a guidewire. The catheter was then placed through the sheath and the sheath removed. Final catheter positioning was confirmed and documented with a fluoroscopic spot image. The catheter was aspirated, flushed with saline, and injected with appropriate volume heparin dwells. The venotomy incision was closed with subcuticular 4-0 Vicryl. Dermabond was applied to the incision. The catheter exit site was secured with 0-Prolene retention sutures. COMPLICATIONS: None.  No pneumothorax. FINDINGS: After catheter placement, the tip lies in the right atrium. The catheter aspirates normally and is ready for immediate use. IMPRESSION: Placement of tunneled hemodialysis catheter via the right internal jugular vein. The catheter tip lies in the right atrium. The catheter is ready for immediate use. Electronically Signed   By: Aletta Edouard M.D.   On: 11/11/2017 09:58   Ir Removal Tun Cv Cath W/o Fl  Result Date: 11/28/2017 INDICATION: History of Goodpasture's disease. Status post completion of plasma exchange therapy. Request removal of tunneled plasmapheresis catheter. Catheter originally placed on 11/11/2017. EXAM: REMOVAL OF TUNNELED PLASMAPHERESIS CATHETER MEDICATIONS: None COMPLICATIONS: None immediate. PROCEDURE: Informed written consent was obtained from the patient following an explanation of the procedure, risks, benefits and alternatives to treatment. A time out was performed prior to the initiation of the  procedure. Maximal barrier sterile technique was utilized including caps, mask, sterile gowns, sterile gloves, large sterile drape, hand hygiene, and chlorhexidine. Utilizing a combination of blunt dissection and gentle traction, the catheter was removed intact. Hemostasis was obtained with manual compression. A dressing was placed. The patient tolerated the procedure well without immediate post procedural complication. IMPRESSION: Successful removal of tunneled plasmapheresis catheter. Read by: Ascencion Dike PA-C Electronically Signed   By: Lucrezia Europe M.D.   On: 11/28/2017 14:30   Ir US Guide Vasc Access Right  Result Date: 11/11/2017 CLINICAL DATA:  Renal failure requiring hemodialysis. EXAM: TUNNELED CENTRAL VENOUS HEMODIALYSIS CATHETER PLACEMENT WITH ULTRASOUND AND FLUOROSCOPIC GUIDANCE ANESTHESIA/SEDATION: 2.0 mg IV Versed; 100 mcg IV Fentanyl. Total Moderate Sedation Time:   20 minutes. The patient's level of consciousness and physiologic status were continuously monitored during the procedure by Radiology nursing. MEDICATIONS: 2 g IV Ancef. FLUOROSCOPY TIME:  24 seconds.  1.5 mGy. PROCEDURE: The procedure,  risks, benefits, and alternatives were explained to the patient. Questions regarding the procedure were encouraged and answered. The patient understands and consents to the procedure. A timeout was performed prior to initiating the procedure. The right neck and chest were prepped with chlorhexidine in a sterile fashion, and a sterile drape was applied covering the operative field. Maximum barrier sterile technique with sterile gowns and gloves were used for the procedure. Local anesthesia was provided with 1% lidocaine. Ultrasound was used to confirm patency of the right internal jugular vein. After creating a small venotomy incision, a 21 gauge needle was advanced into the right internal jugular vein under direct, real-time ultrasound guidance. Ultrasound image documentation was performed. After  securing guidewire access, an 8 Fr dilator was placed. A J-wire was kinked to measure appropriate catheter length. A Palindrome tunneled hemodialysis catheter measuring 19 cm from tip to cuff was chosen for placement. This was tunneled in a retrograde fashion from the chest wall to the venotomy incision. At the venotomy, serial dilatation was performed and a 16 Fr peel-away sheath was placed over a guidewire. The catheter was then placed through the sheath and the sheath removed. Final catheter positioning was confirmed and documented with a fluoroscopic spot image. The catheter was aspirated, flushed with saline, and injected with appropriate volume heparin dwells. The venotomy incision was closed with subcuticular 4-0 Vicryl. Dermabond was applied to the incision. The catheter exit site was secured with 0-Prolene retention sutures. COMPLICATIONS: None.  No pneumothorax. FINDINGS: After catheter placement, the tip lies in the right atrium. The catheter aspirates normally and is ready for immediate use. IMPRESSION: Placement of tunneled hemodialysis catheter via the right internal jugular vein. The catheter tip lies in the right atrium. The catheter is ready for immediate use. Electronically Signed   By: Aletta Edouard M.D.   On: 11/11/2017 09:58   Dg Chest Port 1 View  Result Date: 11/11/2017 CLINICAL DATA:  Hemoptysis EXAM: PORTABLE CHEST 1 VIEW COMPARISON:  November 06, 2017 chest radiograph and chest CT November 07, 2017 FINDINGS: Central catheter tip in superior vena cava. No pneumothorax. There is widespread interstitial and patchy alveolar opacities, similar to recent studies. There is cardiomegaly with mild pulmonary venous hypertension. There is aortic atherosclerosis. No adenopathy. No bone lesions. IMPRESSION: Stable widespread interstitial and alveolar opacities as noted on recent studies. Question atypical pneumonia versus hypersensitivity pneumonitis. A degree of pulmonary hemorrhage is possible. A  somewhat atypical presentation of congestive heart failure is also possible. CT suggests a degree of underlying fibrosis. Stable changes of pulmonary vascular congestion. There is aortic atherosclerosis. Central catheter as described without pneumothorax. Aortic Atherosclerosis (ICD10-I70.0). Electronically Signed   By: Lowella Grip III M.D.   On: 11/11/2017 07:40   Dg Abd Portable 1v  Result Date: 11/22/2017 CLINICAL DATA:  Abdominal distension EXAM: PORTABLE ABDOMEN - 1 VIEW COMPARISON:  None. FINDINGS: Scattered large and small bowel gas is noted. Mild retained fecal material is seen. No free air is noted. No definitive signs of ascites are seen. No acute bony abnormality is noted. IMPRESSION: No acute abnormality noted. Electronically Signed   By: Inez Catalina M.D.   On: 11/22/2017 17:36   US Biopsy (kidney)  Result Date: 11/17/2017 INDICATION: Acute worsening kidney function with clinical concern for Goodpasture's disease. Please perform ultrasound-guided random renal biopsy for tissue diagnostic purposes. EXAM: ULTRASOUND GUIDED RENAL BIOPSY COMPARISON:  Abdominal ultrasound - 09/04/2017 MEDICATIONS: None. ANESTHESIA/SEDATION: Fentanyl 100 mcg IV; Versed 2 mg IV Total Moderate Sedation time: 10  minutes; The patient was continuously monitored during the procedure by the interventional radiology nurse under my direct supervision. COMPLICATIONS: None immediate. PROCEDURE: Informed written consent was obtained from the patient (via the use of a medical translator as well as translation from the patient's son) after a discussion of the risks, benefits and alternatives to treatment. The patient understands and consents the procedure. A timeout was performed prior to the initiation of the procedure. Ultrasound scanning was performed of the bilateral flanks. The inferior pole of the right kidney was selected for biopsy due to location and sonographic window. The procedure was planned. The operative site  was prepped and draped in the usual sterile fashion. The overlying soft tissues were anesthetized with 1% lidocaine with epinephrine. A 17 gauge core needle biopsy device was advanced into the inferior cortex of the left kidney and 2 core biopsies were obtained under direct ultrasound guidance. Images were saved for documentation purposes. The biopsy device was removed and hemostasis was obtained with manual compression. Post procedural scanning was negative for significant post procedural hemorrhage or additional complication. A dressing was placed. The patient tolerated the procedure well without immediate post procedural complication. IMPRESSION: Technically successful ultrasound guided right renal biopsy. Electronically Signed   By: Sandi Mariscal M.D.   On: 11/17/2017 14:27    Venous duplex right  Final Interpretation: Right: Ultrasound characteristics of enlarged lymph nodes are noted in the groin. There is no evidence of deep vein thrombosis in the lower extremity.There is no evidence of superficial venous thrombosis. Left: No evidence of common femoral vein obstruction.    Discharge Exam: Vitals:   11/29/17 0012 11/29/17 0838  BP: (!) 151/77 (!) 174/84  Pulse: 88 81  Resp: 18 (!) 24  Temp: 98.3 F (36.8 C) 98.3 F (36.8 C)  SpO2: 100% 100%    General: Pt is alert, awake, not in acute distress Cardiovascular: RRR, S1/S2 +, no rubs, no gallops Respiratory: Minimal crackles bilateral bases, no wheezing, no rhonchi Abdominal: Soft, NT, ND, bowel sounds + Extremities: no edema, no cyanosis    The results of significant diagnostics from this hospitalization (including imaging, microbiology, ancillary and laboratory) are listed below for reference.     Microbiology: No results found for this or any previous visit (from the past 240 hour(s)).   Labs: BNP (last 3 results) Recent Labs    11/06/17 1713  BNP 497.0*   Basic Metabolic Panel: Recent Labs  Lab 11/24/17 0223   11/25/17 0239 11/25/17 0841 11/26/17 0214 11/27/17 0416 11/28/17 0321 11/29/17 0242  NA 134*   < > 135 136 139 138 138 137  K 4.0   < > 3.6 3.6 3.8 3.7 4.0 4.3  CL 93*   < > 93* 91* 95* 95* 97* 96*  CO2 26  --  28  --  30 28 28 29   GLUCOSE 235*   < > 169* 153* 141* 184* 222* 191*  BUN 105*   < > 103* 88* 92* 92* 93* 97*  CREATININE 4.32*   < > 3.95* 3.90* 3.78* 3.89* 3.84* 3.78*  CALCIUM 7.0*  --  7.1*  --  7.3* 6.8* 6.5* 6.5*  MG 1.8  --  1.9  --  1.9 1.9 2.0  --    < > = values in this interval not displayed.   Liver Function Tests: No results for input(s): AST, ALT, ALKPHOS, BILITOT, PROT, ALBUMIN in the last 168 hours. No results for input(s): LIPASE, AMYLASE in the last 168 hours. No  results for input(s): AMMONIA in the last 168 hours. CBC: Recent Labs  Lab 11/25/17 0239 11/25/17 0841 11/26/17 0214 11/26/17 1930 11/27/17 0416 11/28/17 0321 11/29/17 0242  WBC 3.2*  --  3.4*  --  5.0 4.3 3.7*  HGB 10.4* 11.2* 10.2*  --  10.7* 10.3* 9.8*  HCT 30.0* 33.0* 30.8*  --  32.0* 30.2* 29.9*  MCV 76.7*  --  77.2*  --  78.4 78.0 78.1  PLT 33*  --  31* 34* 33* 28* 28*   Cardiac Enzymes: No results for input(s): CKTOTAL, CKMB, CKMBINDEX, TROPONINI in the last 168 hours. BNP: Invalid input(s): POCBNP CBG: Recent Labs  Lab 11/28/17 0720 11/28/17 1207 11/28/17 1654 11/28/17 2131 11/29/17 0743  GLUCAP 189* 173* 80 129* 180*   D-Dimer No results for input(s): DDIMER in the last 72 hours. Hgb A1c No results for input(s): HGBA1C in the last 72 hours. Lipid Profile No results for input(s): CHOL, HDL, LDLCALC, TRIG, CHOLHDL, LDLDIRECT in the last 72 hours. Thyroid function studies No results for input(s): TSH, T4TOTAL, T3FREE, THYROIDAB in the last 72 hours.  Invalid input(s): FREET3 Anemia work up No results for input(s): VITAMINB12, FOLATE, FERRITIN, TIBC, IRON, RETICCTPCT in the last 72 hours. Urinalysis    Component Value Date/Time   COLORURINE YELLOW 11/10/2017  1412   APPEARANCEUR HAZY (A) 11/10/2017 1412   LABSPEC 1.014 11/10/2017 1412   PHURINE 5.0 11/10/2017 1412   GLUCOSEU 50 (A) 11/10/2017 1412   HGBUR SMALL (A) 11/10/2017 1412   BILIRUBINUR NEGATIVE 11/10/2017 1412   BILIRUBINUR neg 09/14/2015 1441   KETONESUR NEGATIVE 11/10/2017 1412   PROTEINUR >=300 (A) 11/10/2017 1412   UROBILINOGEN 0.2 09/14/2015 1441   UROBILINOGEN 0.2 01/27/2013 2052   NITRITE NEGATIVE 11/10/2017 1412   LEUKOCYTESUR NEGATIVE 11/10/2017 1412   Sepsis Labs Invalid input(s): PROCALCITONIN,  WBC,  LACTICIDVEN Microbiology No results found for this or any previous visit (from the past 240 hour(s)).   Patient was seen and examined on the day of discharge and was found to be in stable condition. Time coordinating discharge: 35 minutes including assessment and coordination of care, as well as examination of the patient.   SIGNED:  Dessa Phi, DO [facility] Pager [phone]  If 7PM-7AM, please contact night-coverage [email or web address] Password [facility] 11/29/2017, 10:52 AM

## 2017-11-29 NOTE — Plan of Care (Signed)
  Problem: Safety: Goal: Ability to remain free from injury will improve Outcome: Progressing   Problem: Elimination: Goal: Will not experience complications related to bowel motility Outcome: Not Progressing

## 2017-11-30 ENCOUNTER — Other Ambulatory Visit: Payer: Self-pay | Admitting: Internal Medicine

## 2017-12-01 ENCOUNTER — Telehealth: Payer: Self-pay | Admitting: Internal Medicine

## 2017-12-01 DIAGNOSIS — I129 Hypertensive chronic kidney disease with stage 1 through stage 4 chronic kidney disease, or unspecified chronic kidney disease: Secondary | ICD-10-CM | POA: Diagnosis not present

## 2017-12-01 DIAGNOSIS — N2581 Secondary hyperparathyroidism of renal origin: Secondary | ICD-10-CM | POA: Diagnosis not present

## 2017-12-01 DIAGNOSIS — N184 Chronic kidney disease, stage 4 (severe): Secondary | ICD-10-CM | POA: Diagnosis not present

## 2017-12-01 DIAGNOSIS — D631 Anemia in chronic kidney disease: Secondary | ICD-10-CM | POA: Diagnosis not present

## 2017-12-01 NOTE — Telephone Encounter (Signed)
Scheduled appt per 4/28 sch msg - spoke with pt re appts.

## 2017-12-03 ENCOUNTER — Ambulatory Visit (HOSPITAL_COMMUNITY)
Admission: EM | Admit: 2017-12-03 | Discharge: 2017-12-03 | Disposition: A | Discharge status: Home / Self Care | Payer: Medicare Other | Attending: Family Medicine | Admitting: Family Medicine

## 2017-12-03 ENCOUNTER — Ambulatory Visit (INDEPENDENT_AMBULATORY_CARE_PROVIDER_SITE_OTHER): Payer: Medicare Other

## 2017-12-03 ENCOUNTER — Encounter (HOSPITAL_COMMUNITY): Payer: Self-pay | Admitting: Emergency Medicine

## 2017-12-03 DIAGNOSIS — N186 End stage renal disease: Secondary | ICD-10-CM

## 2017-12-03 DIAGNOSIS — K7290 Hepatic failure, unspecified without coma: Secondary | ICD-10-CM | POA: Diagnosis not present

## 2017-12-03 DIAGNOSIS — R197 Diarrhea, unspecified: Secondary | ICD-10-CM

## 2017-12-03 DIAGNOSIS — R140 Abdominal distension (gaseous): Secondary | ICD-10-CM | POA: Diagnosis not present

## 2017-12-03 DIAGNOSIS — R188 Other ascites: Secondary | ICD-10-CM

## 2017-12-03 DIAGNOSIS — K7030 Alcoholic cirrhosis of liver without ascites: Secondary | ICD-10-CM

## 2017-12-03 DIAGNOSIS — R42 Dizziness and giddiness: Secondary | ICD-10-CM

## 2017-12-03 NOTE — Discharge Instructions (Signed)
Take metamucil daily This adds bulk and fiber to stool and decreases diarrhea It also softens hard stool Need a stool specimen  for  infection  Return if worse or go to ER Call your doctor tomorrow

## 2017-12-03 NOTE — ED Provider Notes (Signed)
[place]    CSN: [id number] Arrival date & time: 12/03/17  1450     History   Chief Complaint Chief Complaint  Patient presents with  . Diarrhea    HPI William [name] is a 67 y.o. male.   HPI  William [name] is a very sick 67 year old gentleman from [place]  who does not speak [place].  He is here with his son who interprets.  He had 2 hospitalizations this last month.  He has a multitude of medical problems I reviewed his most recent hospitalizations, recent imaging, and recent lab work. The son tells me that is here for abdominal distention and diarrhea.  He states that his abdomen is becoming quite large and uncomfortable for him.  His appetite is reduced.  He is still able to eat, no vomiting.  He is having a lot of belching and gas.  He has loose bowels.  He has several loose bowel movement every morning after he eats his breakfast.  Not as many during the day.  This is happened every day since he was in the hospital.  He does not think it is related to the new medications.  While hospitalized he had a abdominal x-ray that showed that he had an excess of stool.  He is took stool softeners for a couple of days.  This was discontinued when he started having diarrhea.  The son is concerned that he has a blockage in his intestines is causing the distention and the continued loose bowels. No fever or chills.  He is compliant with his medication. He has end-stage renal failure with a recent GFR of 15 He has cirrhosis of his liver with decompensation , thrombocytopenia and ascites He has lung disease, he has heart disease, he has controlled diabetes mellitus Past Medical History:  Diagnosis Date  . Anemia   . CHF (congestive heart failure) ([place])   . Cirrhosis ([place])   . Cirrhosis ([place])   . CKD (chronic kidney disease), stage IV ([place])   . Goodpasture's disease [facility])    on outpatient plasmapheresis/notes 11/20/2017  . Grade I diastolic dysfunction 49/20/1007  . History of anemia  due to chronic kidney disease   . History of blood transfusion X 1   "UGIB; low blood count"  . History of plasmapheresis    "qod" (11/20/2017)  . Hypertension   . Pancytopenia ([place]) 08/20/2017  . Type II diabetes mellitus [facility])     Patient Active Problem List   Diagnosis Date Noted  . Pulmonary infiltrates   . Goodpasture's syndrome ([place]) with associated hemoptysis 11/20/2017  . Asymmetric edema of both lower extremities 11/20/2017  . Metabolic acidosis 07/24/7587  . Hemoptysis   . AKI (acute kidney injury) ([place]) 11/07/2017  . Acute hypoxemic respiratory failure ([place]) 11/07/2017  . Acute on chronic respiratory failure with hypoxia ([place]) 11/06/2017  . Acute on chronic diastolic CHF (congestive heart failure) ([place]) 11/06/2017  . Pancytopenia ([place]) 11/06/2017  . Acute renal failure superimposed on stage 4 chronic kidney disease ([place]) 11/06/2017  . GERD (gastroesophageal reflux disease) 11/06/2017  . Essential hypertension 11/06/2017  . Dyspnea on exertion 09/25/2017  . Type II diabetes mellitus with renal manifestations ([place]) 08/25/2015  . Chronic cough 06/10/2013  . Chronic neck pain 06/10/2013    Past Surgical History:  Procedure Laterality Date  . CATARACT EXTRACTION W/ INTRAOCULAR LENS  IMPLANT, BILATERAL Bilateral   . IR FLUORO GUIDE CV LINE RIGHT  11/10/2017  . IR REMOVAL TUN CV CATH W/O FL  11/28/2017  . IR US GUIDE VASC ACCESS RIGHT  11/10/2017  . NECK SURGERY  2007   "back of neck; no hardware in there"       Home Medications    Prior to Admission medications   Medication Sig Start Date End Date Taking? Authorizing Provider  aminocaproic acid (AMICAR) 500 MG tablet Take 1 tablet (500 mg total) by mouth every 8 (eight) hours. 11/29/17 12/29/17  Dessa Phi, DO  amLODipine (NORVASC) 10 MG tablet Take 1 tablet (10 mg total) daily by mouth. 06/20/17 11/06/24  Gildardo Pounds, NP  carvedilol (COREG) 12.5 MG tablet Take 12.5 mg by mouth 2 (two) times daily with a meal.     [provider]  dextromethorphan-guaiFENesin (MUCINEX DM) 30-600 MG 12hr tablet Take 1 tablet by mouth 2 (two) times daily as needed for cough. 11/18/17   Regalado, Belkys A, MD  ferrous gluconate (IRON 27) 240 (27 FE) MG tablet Take 1 tablet by mouth daily.    [provider]  furosemide (LASIX) 40 MG tablet Take 1 tablet (40 mg total) by mouth 2 (two) times daily. 11/18/17 11/18/18  Regalado, Jerald Kief A, MD  insulin NPH-regular Human (NOVOLIN 70/30) (70-30) 100 UNIT/ML injection Inject 18 Units into the skin 2 (two) times daily with a meal. 11/18/17   Regalado, Belkys A, MD  Multiple Vitamins-Minerals (MULTIVITAMIN WITH MINERALS) tablet Take 1 tablet by mouth daily.    [provider]  predniSONE (DELTASONE) 20 MG tablet Take 2 tablets (40 mg total) by mouth daily with breakfast. 11/30/17   Dessa Phi, DO  sodium bicarbonate 650 MG tablet Take 2 tablets (1,300 mg total) by mouth 2 (two) times daily. 11/18/17   Regalado, Cassie Freer, MD    Family History Family History  Problem Relation Age of Onset  . Diabetes Mellitus II Sister   . Stroke Brother     Social History Social History   Tobacco Use  . Smoking status: Never Smoker  . Smokeless tobacco: Never Used  Substance Use Topics  . Alcohol use: Never    Frequency: Never  . Drug use: Never     Allergies   Heparin   Review of Systems Review of Systems  Constitutional: Positive for fatigue and unexpected weight change. Negative for appetite change, chills and fever.  HENT: Negative for ear pain and sore throat.   Eyes: Negative for pain and visual disturbance.  Respiratory: Negative for cough and shortness of breath.   Cardiovascular: Negative for chest pain and palpitations.  Gastrointestinal: Positive for abdominal distention, abdominal pain and diarrhea. Negative for vomiting.       Belching, loose bowels, progressive abdominal distention  Genitourinary: Negative for dysuria and hematuria.    Musculoskeletal: Negative for arthralgias and back pain.  Skin: Negative for color change and rash.  Neurological: Positive for light-headedness. Negative for dizziness, seizures, syncope and headaches.       Generally weak  All other systems reviewed and are negative.    Physical Exam Triage Vital Signs ED Triage Vitals [12/03/17 1522]  Enc Vitals Group     BP 121/71     Pulse Rate 82     Resp 18     Temp 98.4 F (36.9 C)     Temp Source Oral     SpO2 96 %     Weight      Height      Head Circumference      Peak Flow      Pain Score  Pain Loc      Pain Edu?      Excl. in [place]?    No data found.  Updated Vital Signs BP 121/71 (BP Location: Left Arm)   Pulse 82   Temp 98.4 F (36.9 C) (Oral)   Resp 18   SpO2 96%        Physical Exam  Constitutional: He appears well-developed and well-nourished. No distress.  Appears moderately uncomfortable.  HENT:  Head: Normocephalic and atraumatic.  Right Ear: External ear normal.  Left Ear: External ear normal.  Mouth/Throat: Oropharynx is clear and moist.  Eyes: Pupils are equal, round, and reactive to light. Conjunctivae are normal.  Neck: Normal range of motion. Neck supple. No JVD present.  Cardiovascular: Normal rate and regular rhythm.  No murmur heard. Pulmonary/Chest: Effort normal and breath sounds normal. No respiratory distress.  Abdominal: Soft. He exhibits distension.  Large tense abdomen.  Unable to distinguish fluid wave.  Diffuse tenderness.  No mass or organomegaly identified  Musculoskeletal: Normal range of motion. He exhibits edema.  1+ pedal edema  Neurological:  Patient nonverbal.  No eye contact.  Stares into space responds only to son  Skin: Skin is warm and dry.  Nursing note and vitals reviewed.    UC Treatments / Results  L Radiology Dg Abdomen 1 View  Result Date: 12/03/2017 CLINICAL DATA:  Hematemesis. Constipation, abdominal pain and diarrhea. EXAM: ABDOMEN - 1 VIEW COMPARISON:   Abdominal radiograph November 26, 2017 FINDINGS: Paucity of large bowel gas. Multiple loops of nondistended gas-filled small bowel somewhat centrally displaced seen with ascites. Hazy appearance the abdomen. No radio-opaque calculi or other significant radiographic abnormality are seen. IMPRESSION: Nonspecific bowel gas pattern. Hazy appearance of abdomen seen with ascites. Electronically Signed   By: Elon Alas M.D.   On: 12/03/2017 16:15   I discussed the x-ray with Dr. Dorann Lodge.  She states that it looks like he has an increase in his ascites.  She states that he may have an early ileus.  There is no evidence of constipation.  Initial Impression / Assessment and Plan / UC Course  I have reviewed the triage vital signs and the nursing notes.  Pertinent labs & imaging results that were available during my care of the patient were reviewed by me and considered in my medical decision making (see chart for details).      Final Clinical Impressions(s) / UC Diagnoses   Final diagnoses:  Diarrhea, unspecified type  ESRD (end stage renal disease) ([place])  Decompensation of cirrhosis of liver ([place])  Other ascites  I discussed with the son that it appears his liver disease is worsening and is having an increase in his ascites.  We discussed that he can go back to the hospital for paracentesis if this becomes uncomfortable.  There is no evidence of constipation/obstipation.  I did discuss with him that with his decreased appetite, belching, and x-ray findings that it becomes worse instead of better at any time that he needs to go to the emergency room.  He does have follow-up in just a few days with his nephrology doctor. With persistent diarrhea, recent antibiotics, recent hospitalization we will do some stool pathogen studies.   Discharge Instructions     Take metamucil daily This adds bulk and fiber to stool and decreases diarrhea It also softens hard stool Need a stool specimen  for   infection  Return if worse or go to ER Call your doctor tomorrow   ED Prescriptions  None     Controlled Substance Prescriptions [place] Controlled Substance Registry consulted? Not Applicable   Raylene Everts, MD 12/03/17 2114

## 2017-12-03 NOTE — ED Triage Notes (Signed)
Pt here for diarrhea; recently discharged from the hospital and was constipated there; pt with some abd bloating

## 2017-12-06 ENCOUNTER — Emergency Department (HOSPITAL_COMMUNITY): Payer: Medicare Other

## 2017-12-06 ENCOUNTER — Encounter (HOSPITAL_COMMUNITY): Payer: Self-pay | Admitting: Emergency Medicine

## 2017-12-06 ENCOUNTER — Emergency Department (HOSPITAL_COMMUNITY)
Admission: EM | Admit: 2017-12-06 | Discharge: 2017-12-06 | Disposition: A | Discharge status: Home / Self Care | Payer: Medicare Other | Attending: Emergency Medicine | Admitting: Emergency Medicine

## 2017-12-06 ENCOUNTER — Other Ambulatory Visit: Payer: Self-pay

## 2017-12-06 DIAGNOSIS — K7460 Unspecified cirrhosis of liver: Secondary | ICD-10-CM

## 2017-12-06 DIAGNOSIS — Z794 Long term (current) use of insulin: Secondary | ICD-10-CM | POA: Insufficient documentation

## 2017-12-06 DIAGNOSIS — N184 Chronic kidney disease, stage 4 (severe): Secondary | ICD-10-CM | POA: Insufficient documentation

## 2017-12-06 DIAGNOSIS — N189 Chronic kidney disease, unspecified: Secondary | ICD-10-CM

## 2017-12-06 DIAGNOSIS — I5033 Acute on chronic diastolic (congestive) heart failure: Secondary | ICD-10-CM | POA: Diagnosis not present

## 2017-12-06 DIAGNOSIS — E1122 Type 2 diabetes mellitus with diabetic chronic kidney disease: Secondary | ICD-10-CM | POA: Insufficient documentation

## 2017-12-06 DIAGNOSIS — I130 Hypertensive heart and chronic kidney disease with heart failure and stage 1 through stage 4 chronic kidney disease, or unspecified chronic kidney disease: Secondary | ICD-10-CM | POA: Diagnosis not present

## 2017-12-06 DIAGNOSIS — R188 Other ascites: Secondary | ICD-10-CM

## 2017-12-06 DIAGNOSIS — K310 Acute dilatation of stomach: Secondary | ICD-10-CM | POA: Diagnosis present

## 2017-12-06 DIAGNOSIS — R197 Diarrhea, unspecified: Secondary | ICD-10-CM | POA: Diagnosis not present

## 2017-12-06 DIAGNOSIS — I509 Heart failure, unspecified: Secondary | ICD-10-CM | POA: Diagnosis not present

## 2017-12-06 DIAGNOSIS — Z79899 Other long term (current) drug therapy: Secondary | ICD-10-CM | POA: Diagnosis not present

## 2017-12-06 DIAGNOSIS — K7031 Alcoholic cirrhosis of liver with ascites: Secondary | ICD-10-CM | POA: Diagnosis not present

## 2017-12-06 LAB — COMPREHENSIVE METABOLIC PANEL
ALT: 29 U/L (ref 17–63)
AST: 44 U/L — ABNORMAL HIGH (ref 15–41)
Albumin: 3.3 g/dL — ABNORMAL LOW (ref 3.5–5.0)
Alkaline Phosphatase: 87 U/L (ref 38–126)
Anion gap: 14 (ref 5–15)
BILIRUBIN TOTAL: 1 mg/dL (ref 0.3–1.2)
BUN: 82 mg/dL — ABNORMAL HIGH (ref 6–20)
CHLORIDE: 101 mmol/L (ref 101–111)
CO2: 25 mmol/L (ref 22–32)
CREATININE: 3.65 mg/dL — AB (ref 0.61–1.24)
Calcium: 7 mg/dL — ABNORMAL LOW (ref 8.9–10.3)
GFR, EST AFRICAN AMERICAN: 19 mL/min — AB (ref >60)
GFR, EST NON AFRICAN AMERICAN: 16 mL/min — AB (ref >60)
Glucose, Bld: 176 mg/dL — ABNORMAL HIGH (ref 65–99)
Potassium: 3.9 mmol/L (ref 3.5–5.1)
Sodium: 140 mmol/L (ref 135–145)
TOTAL PROTEIN: 5.8 g/dL — AB (ref 6.5–8.1)

## 2017-12-06 LAB — CBC
HCT: 33.3 % — ABNORMAL LOW (ref 39.0–52.0)
Hemoglobin: 10.9 g/dL — ABNORMAL LOW (ref 13.0–17.0)
MCH: 25.9 pg — ABNORMAL LOW (ref 26.0–34.0)
MCHC: 32.7 g/dL (ref 30.0–36.0)
MCV: 79.1 fL (ref 78.0–100.0)
Platelets: 49 10*3/uL — ABNORMAL LOW (ref 150–400)
RBC: 4.21 MIL/uL — AB (ref 4.22–5.81)
RDW: 17.4 % — ABNORMAL HIGH (ref 11.5–15.5)
WBC: 3.1 10*3/uL — AB (ref 4.0–10.5)

## 2017-12-06 LAB — LIPASE, BLOOD: LIPASE: 29 U/L (ref 11–51)

## 2017-12-06 NOTE — ED Notes (Signed)
Out to CT 

## 2017-12-06 NOTE — ED Provider Notes (Signed)
[facility] EMERGENCY DEPARTMENT Provider Note   CSN: [id number] Arrival date & time: 12/06/17  1416     History   Chief Complaint Chief Complaint  Patient presents with  . abdominal distention    HPI William [name] is a 67 y.o. male.  HPI Patient has prior medical history of cirrhosis and kidney failure.  He was discharged from the hospital 8 days ago.  Diagnosis was Goodpasture syndrome with associated hemoptysis.  Diabetes with renal dysfunction.  Stage IV chronic kidney disease.  Patient son brings him back to the hospital.  He reports that he continues to have abdominal distention and swelling.  He reports it creates a lot of pressure sensation.  There has not been any fever or vomiting.  He reports that during his hospitalization he was placed on a stool softener and started to develop diarrhea.  He reports he did have a more normal bowel movement yesterday.  They are here today due to concerns for possible worsening illness due to the abdominal distention and swelling.  He otherwise has been eating and drinking normally.  He has no other complaints of increased shortness of breath, chest pain or pressure persistent cough.  Lower extremity swelling has resolved. Past Medical History:  Diagnosis Date  . Anemia   . CHF (congestive heart failure) ([place])   . Cirrhosis ([place])   . Cirrhosis ([place])   . CKD (chronic kidney disease), stage IV ([place])   . Goodpasture's disease [facility])    on outpatient plasmapheresis/notes 11/20/2017  . Grade I diastolic dysfunction 35/70/1779  . History of anemia due to chronic kidney disease   . History of blood transfusion X 1   "UGIB; low blood count"  . History of plasmapheresis    "qod" (11/20/2017)  . Hypertension   . Pancytopenia ([place]) 08/20/2017  . Type II diabetes mellitus [facility])     Patient Active Problem List   Diagnosis Date Noted  . Pulmonary infiltrates   . Goodpasture's syndrome ([place]) with associated hemoptysis 11/20/2017    . Asymmetric edema of both lower extremities 11/20/2017  . Metabolic acidosis 39/10/90  . Hemoptysis   . AKI (acute kidney injury) ([place]) 11/07/2017  . Acute hypoxemic respiratory failure ([place]) 11/07/2017  . Acute on chronic respiratory failure with hypoxia ([place]) 11/06/2017  . Acute on chronic diastolic CHF (congestive heart failure) ([place]) 11/06/2017  . Pancytopenia ([place]) 11/06/2017  . Acute renal failure superimposed on stage 4 chronic kidney disease ([place]) 11/06/2017  . GERD (gastroesophageal reflux disease) 11/06/2017  . Essential hypertension 11/06/2017  . Dyspnea on exertion 09/25/2017  . Type II diabetes mellitus with renal manifestations ([place]) 08/25/2015  . Chronic cough 06/10/2013  . Chronic neck pain 06/10/2013    Past Surgical History:  Procedure Laterality Date  . CATARACT EXTRACTION W/ INTRAOCULAR LENS  IMPLANT, BILATERAL Bilateral   . IR FLUORO GUIDE CV LINE RIGHT  11/10/2017  . IR REMOVAL TUN CV CATH W/O FL  11/28/2017  . IR US GUIDE VASC ACCESS RIGHT  11/10/2017  . NECK SURGERY  2007   "back of neck; no hardware in there"        Home Medications    Prior to Admission medications   Medication Sig Start Date End Date Taking? Authorizing Provider  acetaminophen (TYLENOL) 650 MG CR tablet Take 650 mg by mouth every 8 (eight) hours as needed for pain.   Yes [provider]  aminocaproic acid (AMICAR) 500 MG tablet Take 1 tablet (500 mg total) by  mouth every 8 (eight) hours. 11/29/17 12/29/17 Yes Dessa Phi, DO  amLODipine (NORVASC) 10 MG tablet Take 1 tablet (10 mg total) daily by mouth. 06/20/17 11/06/24 Yes Gildardo Pounds, NP  carvedilol (COREG) 12.5 MG tablet Take 12.5 mg by mouth 2 (two) times daily with a meal.   Yes [provider]  dextromethorphan-guaiFENesin (MUCINEX DM) 30-600 MG 12hr tablet Take 1 tablet by mouth 2 (two) times daily as needed for cough. 11/18/17  Yes Regalado, Belkys A, MD  ferrous gluconate (IRON 27) 240 (27 FE) MG tablet  Take 240 mg by mouth daily.    Yes [provider]  furosemide (LASIX) 40 MG tablet Take 1 tablet (40 mg total) by mouth 2 (two) times daily. 11/18/17 11/18/18 Yes Regalado, Belkys A, MD  insulin NPH-regular Human (NOVOLIN 70/30) (70-30) 100 UNIT/ML injection Inject 18 Units into the skin 2 (two) times daily with a meal. 11/18/17  Yes Regalado, Belkys A, MD  Multiple Vitamins-Minerals (MULTIVITAMIN WITH MINERALS) tablet Take 1 tablet by mouth daily.   Yes [provider]  predniSONE (DELTASONE) 20 MG tablet Take 2 tablets (40 mg total) by mouth daily with breakfast. Patient taking differently: Take 40 mg by mouth daily with lunch.  11/30/17  Yes Dessa Phi, DO  sodium bicarbonate 650 MG tablet Take 2 tablets (1,300 mg total) by mouth 2 (two) times daily. 11/18/17  Yes Regalado, Cassie Freer, MD    Family History Family History  Problem Relation Age of Onset  . Diabetes Mellitus II Sister   . Stroke Brother     Social History Social History   Tobacco Use  . Smoking status: Never Smoker  . Smokeless tobacco: Never Used  Substance Use Topics  . Alcohol use: Never    Frequency: Never  . Drug use: Never     Allergies   Heparin   Review of Systems Review of Systems 10 Systems reviewed and are negative for acute change except as noted in the HPI.   Physical Exam Updated Vital Signs BP (!) 176/94 (BP Location: Right Arm)   Pulse 87   Temp 98.2 F (36.8 C)   Resp 16   SpO2 98%   Physical Exam  Constitutional: He is oriented to person, place, and time. He appears well-developed and well-nourished.  Patient is alert and nontoxic.  He does not appear to be any distress.  Respiratory distress.  Communicates through his son.  He is cooperative.  HENT:  Head: Normocephalic and atraumatic.  Mouth/Throat: Oropharynx is clear and moist.  Eyes: EOM are normal.  Neck: Neck supple. No thyromegaly present.  Cardiovascular: Normal rate, regular rhythm, normal heart sounds  and intact distal pulses.  Pulmonary/Chest: Effort normal and breath sounds normal.  Abdominal: Soft. Bowel sounds are normal. He exhibits distension. There is no tenderness.  Abdomen is moderately to severely distended consistent with ascites.  He is nontender to palpation.  No guarding or peritoneal signs.  No erythema of the abdominal wall.  Femoral pulses 2+ and symmetric.  Musculoskeletal: Normal range of motion. He exhibits no edema or tenderness.  No calf tenderness.  No significant peripheral edema.  Skin condition good.  Lymphadenopathy:    He has no cervical adenopathy.  Neurological: He is alert and oriented to person, place, and time. He has normal strength. He exhibits normal muscle tone. Coordination normal. GCS eye subscore is 4. GCS verbal subscore is 5. GCS motor subscore is 6.  Skin: Skin is warm, dry and intact.  Psychiatric:  He has a normal mood and affect.     ED Treatments / Results  Labs (all labs ordered are listed, but only abnormal results are displayed) Labs Reviewed  COMPREHENSIVE METABOLIC PANEL - Abnormal; Notable for the following components:      Result Value   Glucose, Bld 176 (*)    BUN 82 (*)    Creatinine, Ser 3.65 (*)    Calcium 7.0 (*)    Total Protein 5.8 (*)    Albumin 3.3 (*)    AST 44 (*)    GFR calc non Af Amer 16 (*)    GFR calc Af Amer 19 (*)    All other components within normal limits  CBC - Abnormal; Notable for the following components:   WBC 3.1 (*)    RBC 4.21 (*)    Hemoglobin 10.9 (*)    HCT 33.3 (*)    MCH 25.9 (*)    RDW 17.4 (*)    Platelets 49 (*)    All other components within normal limits  LIPASE, BLOOD  URINALYSIS, ROUTINE W REFLEX MICROSCOPIC    EKG None  Radiology Ct Abdomen Pelvis Wo Contrast  Result Date: 12/06/2017 CLINICAL DATA:  Abdominal distention and diarrhea. EXAM: CT ABDOMEN AND PELVIS WITHOUT CONTRAST TECHNIQUE: Multidetector CT imaging of the abdomen and pelvis was performed following the  standard protocol without IV contrast. COMPARISON:  Abdominal ultrasound dated September 04, 2017. FINDINGS: Lower chest: Bronchiectasis and scarring at both lung bases. Mild interlobular septal thickening. Hepatobiliary: Small, nodular liver with prominent periportal edema. No focal liver abnormality. The gallbladder is unremarkable. Pancreas: Atrophic. No ductal dilatation or surrounding inflammatory changes. Spleen: Moderately enlarged.  No focal abnormality. Adrenals/Urinary Tract: Adrenal glands are unremarkable. Moderate bilateral renal atrophy. No renal or ureteral calculi. No hydronephrosis. Stomach/Bowel: Stomach is within normal limits. Appendix appears normal. No evidence of bowel wall thickening, distention, or inflammatory changes. Vascular/Lymphatic: Splenic and possibly gastric varices. No enlarged abdominal or pelvic lymph nodes. Reproductive: Mild prostatomegaly. Other: Moderate ascites.  No pneumoperitoneum. Musculoskeletal: No acute or significant osseous findings. IMPRESSION: 1. Cirrhosis with sequelae of portal hypertension including moderate splenomegaly and ascites. Splenic and possibly gastric varices. 2. Moderate bilateral renal atrophy. 3. Bronchiectasis and scarring at both lung bases, likely postinflammatory/postinfectious. 4. Mild pulmonary interstitial edema at the lung bases. Electronically Signed   By: Titus Dubin M.D.   On: 12/06/2017 18:16    Procedures Procedures (including critical care time)  Medications Ordered in ED Medications - No data to display   Initial Impression / Assessment and Plan / ED Course  I have reviewed the triage vital signs and the nursing notes.  Pertinent labs & imaging results that were available during my care of the patient were reviewed by me and considered in my medical decision making (see chart for details).     Final Clinical Impressions(s) / ED Diagnoses   Final diagnoses:  Cirrhosis of liver with ascites, unspecified hepatic  cirrhosis type ([place])  Chronic renal failure, unspecified CKD stage  Patient has multiple chronic medical conditions.  However, all appears stable at this time.  Patient has had no decline in renal function since discharge.  Hepatic function is stable.  CT abdomen obtained showing ascites but no bowel obstruction or acute surgical etiologies.  Patient does not have significant amount of pain.  No fevers or signs of general illness to suggest SBP.  It appears main concern is for the distention and to ascertain that there is no worsening  underlying medical condition.  At this time, by EMR reviewed is not clear as to the exact etiology of the patient's cirrhosis although it certainly appears chronic, nonalcoholic.  I discussed the merits of paracentesis with the patient's son.  At this time I do not feel the risk-benefit is in favor of therapeutic paracentesis.  Patient does not need diagnostic paracentesis.  I counseled for gastroenterology follow-up to further define patient's cirrhosis type and anticipated course, and at that time, determine if therapeutic paracenteses are appropriate for the patient.  He is not significantly symptomatic at this time.  He is not experiencing persistent pain or dyspnea.  ED Discharge Orders        Ordered    Ambulatory referral to Gastroenterology     12/06/17 Alfonse Flavors, MD 12/06/17 1943

## 2017-12-06 NOTE — ED Triage Notes (Addendum)
The pt has had 2 recent hospital admissions and 1 visit to urgent care. The son states he has had abdominal distention with constipation previously, they put him on stool softeners but they were stopped when he began having diarrhea. He has had diarrhea for several days now, but 1 normal BM yesterday. Son reports his abdomen was swollen yesterday but today it is much larger. No nausea. No blood in stool. Abdomen is very large and distended. Hx of cirrhosis.

## 2017-12-06 NOTE — Discharge Instructions (Signed)
1.  Call gastroenterology Monday morning to schedule a follow-up appointment as soon as possible.  Keep all of your other follow-up appointments with your specialist.

## 2017-12-08 DIAGNOSIS — M310 Hypersensitivity angiitis: Secondary | ICD-10-CM | POA: Diagnosis not present

## 2017-12-08 DIAGNOSIS — K7460 Unspecified cirrhosis of liver: Secondary | ICD-10-CM | POA: Diagnosis not present

## 2017-12-08 DIAGNOSIS — D509 Iron deficiency anemia, unspecified: Secondary | ICD-10-CM | POA: Diagnosis not present

## 2017-12-08 DIAGNOSIS — N189 Chronic kidney disease, unspecified: Secondary | ICD-10-CM | POA: Diagnosis not present

## 2017-12-08 DIAGNOSIS — R188 Other ascites: Secondary | ICD-10-CM | POA: Diagnosis not present

## 2017-12-09 ENCOUNTER — Encounter: Payer: Self-pay | Admitting: Pulmonary Disease

## 2017-12-09 ENCOUNTER — Ambulatory Visit (INDEPENDENT_AMBULATORY_CARE_PROVIDER_SITE_OTHER): Payer: Medicare Other | Admitting: Pulmonary Disease

## 2017-12-09 ENCOUNTER — Other Ambulatory Visit (HOSPITAL_COMMUNITY): Payer: Self-pay | Admitting: Gastroenterology

## 2017-12-09 DIAGNOSIS — R918 Other nonspecific abnormal finding of lung field: Secondary | ICD-10-CM | POA: Diagnosis not present

## 2017-12-09 DIAGNOSIS — R188 Other ascites: Secondary | ICD-10-CM | POA: Diagnosis not present

## 2017-12-09 DIAGNOSIS — K7460 Unspecified cirrhosis of liver: Secondary | ICD-10-CM

## 2017-12-09 NOTE — Patient Instructions (Signed)
We will wait for your blood counts to improve before we proceed with bronchoscopy and lung biopsy

## 2017-12-09 NOTE — Progress Notes (Signed)
   Subjective:    Patient ID: William [name], male    DOB: 02/14/1951, 67 y.o.   MRN: [id number]  HPI  67 year old man from [place] with acute renal failure with renal biopsy showing acute glomerulonephritis with FSGS but not enough specimen to run immunofluorescence for GBM. He has been treated with plasmapheresis and Cytoxan. Hemoptysis dates back to 07/2017. ANCA neg, antiGBM low positive CT chest 09/2017 shows relatively diffuse groundglass and airspace disease with cardiomegaly and bilateral effusions Cirrhosis was noted on  CT scan, no h/ o ETOH use  He was hospitalized 4/17 to 4/27, underwent plasmapheresis.  Course was complicated by pancytopenia and hence transbronchial biopsy was deferred due to severe thrombocytopenia  Last platelets was 49k noted on 5/4 He was discharged on 40 twice daily of Lasix but urinary output was not adequate percent.  He is accompanied by his son and interpreter today. Hemoptysis is now resolved.  However he has developed increasing pedal edema and abdominal distention.  In fact he had an ED visit on 5/4 with CT abdomen showing ascites, he has been seen by GI and paracentesis is scheduled for 5/8    Review of Systems neg for any significant sore throat, dysphagia, itching, sneezing, nasal congestion or excess/ purulent secretions, fever, chills, sweats, unintended wt loss, pleuritic or exertional cp, hempoptysis, orthopnea pnd or change in chronic leg swelling.  Also denies presyncope, palpitations, heartburn, abdominal pain, nausea, vomiting, diarrhea or change in bowel or urinary habits, dysuria,hematuria, rash, arthralgias, visual complaints, headache, numbness weakness or ataxia.     Objective:   Physical Exam  Gen. Pleasant, well-nourished, in no distress ENT - no thrush, no post nasal drip, pallor +, muddy sclera Neck: No JVD, no thyromegaly, no carotid bruits Lungs: no use of accessory muscles, no dullness to percussion, clear without rales or  rhonchi  Cardiovascular: Rhythm regular, heart sounds  normal, no murmurs or gallops, 2+ peripheral edema Abd - distended, fluid thrill + Musculoskeletal: No deformities, no cyanosis or clubbing         Assessment & Plan:

## 2017-12-09 NOTE — Assessment & Plan Note (Signed)
Paracentesis is planned for 5/18 GI following No unifying diagnosis

## 2017-12-09 NOTE — Assessment & Plan Note (Signed)
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  Will proceed with transbronchial biopsy when platelet count is improved probably give him another 2 to 3 weeks since count seem to be improving Hemoptysis seems to have resolved even prior to initiation of plasmapheresis

## 2017-12-10 ENCOUNTER — Encounter (HOSPITAL_COMMUNITY)
Admission: RE | Admit: 2017-12-10 | Discharge: 2017-12-10 | Disposition: A | Discharge status: Home / Self Care | Payer: Medicare Other | Source: Ambulatory Visit | Attending: Gastroenterology | Admitting: Gastroenterology

## 2017-12-10 ENCOUNTER — Encounter (HOSPITAL_COMMUNITY): Payer: Self-pay | Admitting: Student

## 2017-12-10 ENCOUNTER — Inpatient Hospital Stay: Payer: Medicaid Other | Admitting: Internal Medicine

## 2017-12-10 DIAGNOSIS — R188 Other ascites: Secondary | ICD-10-CM | POA: Diagnosis not present

## 2017-12-10 DIAGNOSIS — K7460 Unspecified cirrhosis of liver: Secondary | ICD-10-CM | POA: Diagnosis not present

## 2017-12-10 HISTORY — PX: IR PARACENTESIS: IMG2679

## 2017-12-10 LAB — PROTEIN, PLEURAL OR PERITONEAL FLUID

## 2017-12-10 LAB — BODY FLUID CELL COUNT WITH DIFFERENTIAL
Eos, Fluid: 1 %
Lymphs, Fluid: 28 %
Monocyte-Macrophage-Serous Fluid: 68 % (ref 50–90)
NEUTROPHIL FLUID: 3 % (ref 0–25)
Total Nucleated Cell Count, Fluid: 79 cu mm (ref 0–1000)

## 2017-12-10 LAB — PATHOLOGIST SMEAR REVIEW

## 2017-12-10 LAB — ALBUMIN, PLEURAL OR PERITONEAL FLUID: Albumin, Fluid: <1 g/dL

## 2017-12-10 MED ORDER — LIDOCAINE HCL (PF) 2 % IJ SOLN
INTRAMUSCULAR | Status: AC
  Filled 2017-12-10: qty 20

## 2017-12-10 MED ORDER — ALBUMIN HUMAN 25 % IV SOLN
50.0000 g | Freq: Once | INTRAVENOUS | Status: DC
  Administered 2017-12-10: 50 g via INTRAVENOUS

## 2017-12-10 MED ORDER — ALBUMIN HUMAN 25 % IV SOLN
50.0000 g | Freq: Once | INTRAVENOUS | Status: DC
  Filled 2017-12-10: qty 200

## 2017-12-10 MED ORDER — LIDOCAINE HCL 1 % IJ SOLN
INTRAMUSCULAR | Status: DC | PRN
  Administered 2017-12-10: 10 mL

## 2017-12-10 NOTE — Procedures (Signed)
PROCEDURE SUMMARY:  Successful US guided paracentesis from right lateral abdomen.  Yielded 3.8 liters of cloudy, yellow fluid.  No immediate complications.  Pt tolerated well.   Specimen was sent for labs.  Docia Barrier PA-C 12/10/2017 11:22 AM

## 2017-12-11 ENCOUNTER — Other Ambulatory Visit: Payer: Self-pay | Admitting: Medical Oncology

## 2017-12-11 DIAGNOSIS — D61818 Other pancytopenia: Secondary | ICD-10-CM

## 2017-12-15 ENCOUNTER — Other Ambulatory Visit: Payer: Self-pay | Admitting: Internal Medicine

## 2017-12-15 ENCOUNTER — Telehealth: Payer: Self-pay | Admitting: Internal Medicine

## 2017-12-15 ENCOUNTER — Inpatient Hospital Stay: Payer: Medicare Other

## 2017-12-15 ENCOUNTER — Inpatient Hospital Stay: Payer: Medicare Other | Attending: Internal Medicine | Admitting: Internal Medicine

## 2017-12-15 ENCOUNTER — Encounter: Payer: Self-pay | Admitting: Internal Medicine

## 2017-12-15 VITALS — BP 147/72 | HR 82 | Temp 97.9°F | Resp 18 | Ht 65.0 in | Wt 156.7 lb

## 2017-12-15 DIAGNOSIS — N179 Acute kidney failure, unspecified: Secondary | ICD-10-CM | POA: Diagnosis not present

## 2017-12-15 DIAGNOSIS — K7460 Unspecified cirrhosis of liver: Secondary | ICD-10-CM | POA: Diagnosis not present

## 2017-12-15 DIAGNOSIS — I10 Essential (primary) hypertension: Secondary | ICD-10-CM

## 2017-12-15 DIAGNOSIS — R140 Abdominal distension (gaseous): Secondary | ICD-10-CM | POA: Diagnosis not present

## 2017-12-15 DIAGNOSIS — D61818 Other pancytopenia: Secondary | ICD-10-CM | POA: Insufficient documentation

## 2017-12-15 DIAGNOSIS — R188 Other ascites: Secondary | ICD-10-CM | POA: Insufficient documentation

## 2017-12-15 DIAGNOSIS — I129 Hypertensive chronic kidney disease with stage 1 through stage 4 chronic kidney disease, or unspecified chronic kidney disease: Secondary | ICD-10-CM

## 2017-12-15 DIAGNOSIS — N184 Chronic kidney disease, stage 4 (severe): Secondary | ICD-10-CM

## 2017-12-15 DIAGNOSIS — M310 Hypersensitivity angiitis: Secondary | ICD-10-CM | POA: Insufficient documentation

## 2017-12-15 LAB — CMP (CANCER CENTER ONLY)
ALK PHOS: 102 U/L (ref 40–150)
ALT: 26 U/L (ref 0–55)
ANION GAP: 9 (ref 3–11)
AST: 27 U/L (ref 5–34)
Albumin: 3.4 g/dL — ABNORMAL LOW (ref 3.5–5.0)
BILIRUBIN TOTAL: 0.6 mg/dL (ref 0.2–1.2)
BUN: 83 mg/dL — ABNORMAL HIGH (ref 7–26)
CALCIUM: 6.8 mg/dL — AB (ref 8.4–10.4)
CO2: 27 mmol/L (ref 22–29)
CREATININE: 3.68 mg/dL — AB (ref 0.70–1.30)
Chloride: 106 mmol/L (ref 98–109)
GFR, EST AFRICAN AMERICAN: 18 mL/min — AB (ref >60)
GFR, Estimated: 16 mL/min — ABNORMAL LOW (ref >60)
GLUCOSE: 109 mg/dL (ref 70–140)
Potassium: 4.1 mmol/L (ref 3.5–5.1)
Sodium: 142 mmol/L (ref 136–145)
Total Protein: 5.8 g/dL — ABNORMAL LOW (ref 6.4–8.3)

## 2017-12-15 LAB — CBC WITH DIFFERENTIAL (CANCER CENTER ONLY)
Basophils Absolute: 0 10*3/uL (ref 0.0–0.1)
Basophils Relative: 0 %
Eosinophils Absolute: 0 10*3/uL (ref 0.0–0.5)
Eosinophils Relative: 1 %
HEMATOCRIT: 30.7 % — AB (ref 38.4–49.9)
HEMOGLOBIN: 10 g/dL — AB (ref 13.0–17.1)
LYMPHS ABS: 0.5 10*3/uL — AB (ref 0.9–3.3)
LYMPHS PCT: 15 %
MCH: 26 pg — AB (ref 27.2–33.4)
MCHC: 32.6 g/dL (ref 32.0–36.0)
MCV: 79.9 fL (ref 79.3–98.0)
MONOS PCT: 16 %
Monocytes Absolute: 0.5 10*3/uL (ref 0.1–0.9)
NEUTROS ABS: 2.2 10*3/uL (ref 1.5–6.5)
NEUTROS PCT: 68 %
Platelet Count: 78 10*3/uL — ABNORMAL LOW (ref 140–400)
RBC: 3.84 MIL/uL — ABNORMAL LOW (ref 4.20–5.82)
RDW: 18.2 % — ABNORMAL HIGH (ref 11.0–14.6)
WBC Count: 3.3 10*3/uL — ABNORMAL LOW (ref 4.0–10.3)

## 2017-12-15 MED ORDER — AMINOCAPROIC ACID 500 MG PO TABS: 500.0000 mg | ORAL_TABLET | Freq: Three times a day (TID) | ORAL | 0 refills | Status: AC

## 2017-12-15 MED ORDER — AMINOCAPROIC ACID 500 MG PO TABS: 500.0000 mg | ORAL_TABLET | Freq: Three times a day (TID) | ORAL | 0 refills | Status: DC

## 2017-12-15 NOTE — Progress Notes (Signed)
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 3) liver cirrhosis and ascites  PRIOR THERAPY: None  CURRENT THERAPY: Supportive care.  INTERVAL HISTORY: William [name] 67 y.o. male returns to the clinic today for follow-up visit accompanied by his son William [name].  The patient is feeling fine today with no specific complaints except for the abdominal distention.  He underwent ultrasound-guided paracentesis on 12/10/2017 with drainage of 3.8 L of ascites.  The patient was recently diagnosed with Goodpasture syndrome during hospitalization at [facility].  He underwent plasmapheresis.  During his hospitalization his platelet count were very low and he was a started on Nplate as well as Amicar.  He denied having any significant complaints today.  He denied having any fever or chills.  He has no nausea, or vomiting.  He has no fever or chills.  He denied having any chest pain, shortness of breath but continues to have intermittent cough with no current hemoptysis.  He is here today for evaluation and repeat blood work.  MEDICAL HISTORY: Past Medical History:  Diagnosis Date  . Anemia   . CHF (congestive heart failure) ([place])   . Cirrhosis ([place])   . Cirrhosis ([place])   . CKD (chronic kidney disease), stage IV ([place])   . Goodpasture's disease [facility])    on outpatient plasmapheresis/notes 11/20/2017  . Grade I diastolic dysfunction 37/16/9678  . History of anemia due to chronic kidney disease   . History of blood transfusion X 1   "UGIB; low blood count"  . History of plasmapheresis    "qod" (11/20/2017)  . Hypertension   . Pancytopenia ([place]) 08/20/2017  . Type II diabetes mellitus ([facility])     ALLERGIES:  is allergic to heparin.  MEDICATIONS:  Current Outpatient  Medications  Medication Sig Dispense Refill  . acetaminophen (TYLENOL) 650 MG CR tablet Take 650 mg by mouth every 8 (eight) hours as needed for pain.    Marland Kitchen aminocaproic acid (AMICAR) 500 MG tablet Take 1 tablet (500 mg total) by mouth every 8 (eight) hours. 90 tablet 0  . amLODipine (NORVASC) 10 MG tablet Take 1 tablet (10 mg total) daily by mouth. 90 tablet 0  . carvedilol (COREG) 12.5 MG tablet Take 12.5 mg by mouth 2 (two) times daily with a meal.    . ferrous gluconate (IRON 27) 240 (27 FE) MG tablet Take 240 mg by mouth daily.     . furosemide (LASIX) 40 MG tablet Take 1 tablet (40 mg total) by mouth 2 (two) times daily. 60 tablet 0  . insulin NPH-regular Human (NOVOLIN 70/30) (70-30) 100 UNIT/ML injection Inject 18 Units into the skin 2 (two) times daily with a meal. 10 mL 1  . Multiple Vitamins-Minerals (MULTIVITAMIN WITH MINERALS) tablet Take 1 tablet by mouth daily.    . predniSONE (DELTASONE) 20 MG tablet Take 2 tablets (40 mg total) by mouth daily with breakfast. (Patient taking differently: Take 40 mg by mouth daily with lunch. ) 30 tablet 0  . sodium bicarbonate 650 MG tablet Take 2 tablets (1,300 mg total) by mouth 2 (two) times daily. 90 tablet 0  . dextromethorphan-guaiFENesin (MUCINEX DM) 30-600 MG 12hr tablet Take 1 tablet by mouth 2 (two) times daily as needed for cough. (Patient not taking: Reported on 12/15/2017) 10 tablet 0  .  spironolactone (ALDACTONE) 50 MG tablet Take 50 mg by mouth daily.  1   No current facility-administered medications for this visit.     SURGICAL HISTORY:  Past Surgical History:  Procedure Laterality Date  . CATARACT EXTRACTION W/ INTRAOCULAR LENS  IMPLANT, BILATERAL Bilateral   . IR FLUORO GUIDE CV LINE RIGHT  11/10/2017  . IR PARACENTESIS  12/10/2017  . IR REMOVAL TUN CV CATH W/O FL  11/28/2017  . IR US GUIDE VASC ACCESS RIGHT  11/10/2017  . NECK SURGERY  2007   "back of neck; no hardware in there"    REVIEW OF SYSTEMS:  Constitutional: positive  for fatigue Eyes: negative Ears, nose, mouth, throat, and face: negative Respiratory: positive for cough Cardiovascular: negative Gastrointestinal: positive for Abdominal distention Genitourinary:negative Integument/breast: negative Hematologic/lymphatic: negative Musculoskeletal:negative Neurological: negative Behavioral/Psych: negative Endocrine: negative Allergic/Immunologic: negative   PHYSICAL EXAMINATION: General appearance: alert, cooperative, fatigued and no distress Head: Normocephalic, without obvious abnormality, atraumatic Neck: no adenopathy, no JVD, supple, symmetrical, trachea midline and thyroid not enlarged, symmetric, no tenderness/mass/nodules Lymph nodes: Cervical, supraclavicular, and axillary nodes normal. Resp: rales bilaterally Back: symmetric, no curvature. ROM normal. No CVA tenderness. Cardio: regular rate and rhythm, S1, S2 normal, no murmur, click, rub or gallop GI: normal findings: Abdominal distention with reaccumulation of ascites Extremities: extremities normal, atraumatic, no cyanosis or edema Neurologic: Alert and oriented X 3, normal strength and tone. Normal symmetric reflexes. Normal coordination and gait  ECOG PERFORMANCE STATUS: 1 - Symptomatic but completely ambulatory  Blood pressure (!) 147/72, pulse 82, temperature 97.9 F (36.6 C), temperature source Oral, resp. rate 18, height 5' 5"  (1.651 m), weight 156 lb 11.2 oz (71.1 kg), SpO2 97 %.  LABORATORY DATA: Lab Results  Component Value Date   WBC 3.3 (L) 12/15/2017   HGB 10.0 (L) 12/15/2017   HCT 30.7 (L) 12/15/2017   MCV 79.9 12/15/2017   PLT 78 (L) 12/15/2017      Chemistry      Component Value Date/Time   NA 142 12/15/2017 1508   NA 137 06/20/2017 1613   K 4.1 12/15/2017 1508   CL 106 12/15/2017 1508   CO2 27 12/15/2017 1508   BUN 83 (H) 12/15/2017 1508   BUN 62 (H) 06/20/2017 1613   CREATININE 3.68 (HH) 12/15/2017 1508   CREATININE 1.23 08/31/2015 0859      Component  Value Date/Time   CALCIUM 6.8 (L) 12/15/2017 1508   ALKPHOS 102 12/15/2017 1508   AST 27 12/15/2017 1508   ALT 26 12/15/2017 1508   BILITOT 0.6 12/15/2017 1508       RADIOGRAPHIC STUDIES: Ct Abdomen Pelvis Wo Contrast  Result Date: 12/06/2017 CLINICAL DATA:  Abdominal distention and diarrhea. EXAM: CT ABDOMEN AND PELVIS WITHOUT CONTRAST TECHNIQUE: Multidetector CT imaging of the abdomen and pelvis was performed following the standard protocol without IV contrast. COMPARISON:  Abdominal ultrasound dated September 04, 2017. FINDINGS: Lower chest: Bronchiectasis and scarring at both lung bases. Mild interlobular septal thickening. Hepatobiliary: Small, nodular liver with prominent periportal edema. No focal liver abnormality. The gallbladder is unremarkable. Pancreas: Atrophic. No ductal dilatation or surrounding inflammatory changes. Spleen: Moderately enlarged.  No focal abnormality. Adrenals/Urinary Tract: Adrenal glands are unremarkable. Moderate bilateral renal atrophy. No renal or ureteral calculi. No hydronephrosis. Stomach/Bowel: Stomach is within normal limits. Appendix appears normal. No evidence of bowel wall thickening, distention, or inflammatory changes. Vascular/Lymphatic: Splenic and possibly gastric varices. No enlarged abdominal or pelvic lymph nodes. Reproductive: Mild prostatomegaly. Other: Moderate ascites.  No  pneumoperitoneum. Musculoskeletal: No acute or significant osseous findings. IMPRESSION: 1. Cirrhosis with sequelae of portal hypertension including moderate splenomegaly and ascites. Splenic and possibly gastric varices. 2. Moderate bilateral renal atrophy. 3. Bronchiectasis and scarring at both lung bases, likely postinflammatory/postinfectious. 4. Mild pulmonary interstitial edema at the lung bases. Electronically Signed   By: Titus Dubin M.D.   On: 12/06/2017 18:16   Dg Chest 2 View  Result Date: 11/19/2017 CLINICAL DATA:  Chest pain EXAM: CHEST - 2 VIEW COMPARISON:   11/11/2017 FINDINGS: Bilateral airspace disease with interval improvement. No effusion. Dual lumen right jugular central venous catheter tip in the SVC unchanged IMPRESSION: Interval improvement in bilateral airspace disease. Electronically Signed   By: Franchot Gallo M.D.   On: 11/19/2017 20:48   Dg Abdomen 1 View  Result Date: 12/03/2017 CLINICAL DATA:  Hematemesis. Constipation, abdominal pain and diarrhea. EXAM: ABDOMEN - 1 VIEW COMPARISON:  Abdominal radiograph November 26, 2017 FINDINGS: Paucity of large bowel gas. Multiple loops of nondistended gas-filled small bowel somewhat centrally displaced seen with ascites. Hazy appearance the abdomen. No radio-opaque calculi or other significant radiographic abnormality are seen. IMPRESSION: Nonspecific bowel gas pattern. Hazy appearance of abdomen seen with ascites. Electronically Signed   By: Elon Alas M.D.   On: 12/03/2017 16:15   Dg Abd 1 View  Result Date: 11/26/2017 CLINICAL DATA:  Abdominal distension EXAM: ABDOMEN - 1 VIEW COMPARISON:  None. FINDINGS: There is moderate stool in the colon. There is no appreciable bowel dilatation or air-fluid level to suggest bowel obstruction. No evident free air. There is a benign exostosis arising from the midportion of the right iliac crest laterally. IMPRESSION: No bowel obstruction or free air evident.  Moderate stool in colon. Electronically Signed   By: Lowella Grip III M.D.   On: 11/26/2017 13:22   Ct Chest Wo Contrast  Result Date: 11/20/2017 CLINICAL DATA:  Hemoptysis EXAM: CT CHEST WITHOUT CONTRAST TECHNIQUE: Multidetector CT imaging of the chest was performed following the standard protocol without IV contrast. COMPARISON:  11/19/2017 chest x-ray.  Chest CT 11/07/2017 FINDINGS: Cardiovascular: Scattered coronary artery and aortic calcifications. Cardiomegaly. No evidence of aortic aneurysm. Small pericardial effusion. Mediastinum/Nodes: Scattered small mediastinal lymph nodes, likely reactive.  No mediastinal, hilar, or axillary adenopathy. Lungs/Pleura: Diffuse bilateral airspace opacities, similar to prior CT. Trace left pleural effusion. Bronchial wall thickening noted in the lower lobes, stable. Upper Abdomen: Changes of cirrhosis. Spleen appears enlarged but is incompletely visualized. Musculoskeletal: Chest wall soft tissues are unremarkable. No acute bony abnormality. IMPRESSION: Diffuse bilateral airspace disease, most confluent in the lower lobes but also noted in the upper lobes. Findings are similar to prior CT. Bronchial wall thickening in the lower lobes. Findings could reflect edema, hemorrhage, or infection. Small left pleural effusion and pericardial effusion. Cardiomegaly. Coronary artery disease, aortic atherosclerosis. Changes of cirrhosis. Suspected splenomegaly although the spleen is not imaged in its entirety. Electronically Signed   By: Rolm Baptise M.D.   On: 11/20/2017 10:43   US Renal  Result Date: 11/22/2017 CLINICAL DATA:  Status post renal biopsy on 11/17/2017 EXAM: RENAL / URINARY TRACT ULTRASOUND COMPLETE COMPARISON:  09/04/2017 FINDINGS: Right Kidney: Length: 8.4 cm. Minimal perinephric fluid is noted along the upper pole of the right kidney this is not in the area of previous biopsy and likely represents the normal appearance of the right kidney. No hematoma from the biopsy site is identified. Left Kidney: Length: 8.2 cm. Echogenicity within normal limits. No mass or hydronephrosis visualized. Bladder: Appears  normal for degree of bladder distention. Note is made of mild ascites and mild prominence of the spleen. IMPRESSION: Mild ascites and splenic prominence consistent with the known underlying cirrhosis. Minimal perinephric fluid is noted in the lung the upper pole of the right kidney not in the area of previous biopsy. No perinephric hematoma is noted in the lower pole. Electronically Signed   By: Inez Catalina M.D.   On: 11/22/2017 20:19   Ir Removal Tun Cv Cath  W/o Fl  Result Date: 11/28/2017 INDICATION: History of Goodpasture's disease. Status post completion of plasma exchange therapy. Request removal of tunneled plasmapheresis catheter. Catheter originally placed on 11/11/2017. EXAM: REMOVAL OF TUNNELED PLASMAPHERESIS CATHETER MEDICATIONS: None COMPLICATIONS: None immediate. PROCEDURE: Informed written consent was obtained from the patient following an explanation of the procedure, risks, benefits and alternatives to treatment. A time out was performed prior to the initiation of the procedure. Maximal barrier sterile technique was utilized including caps, mask, sterile gowns, sterile gloves, large sterile drape, hand hygiene, and chlorhexidine. Utilizing a combination of blunt dissection and gentle traction, the catheter was removed intact. Hemostasis was obtained with manual compression. A dressing was placed. The patient tolerated the procedure well without immediate post procedural complication. IMPRESSION: Successful removal of tunneled plasmapheresis catheter. Read by: Ascencion Dike PA-C Electronically Signed   By: Lucrezia Europe M.D.   On: 11/28/2017 14:30   Dg Abd Portable 1v  Result Date: 11/22/2017 CLINICAL DATA:  Abdominal distension EXAM: PORTABLE ABDOMEN - 1 VIEW COMPARISON:  None. FINDINGS: Scattered large and small bowel gas is noted. Mild retained fecal material is seen. No free air is noted. No definitive signs of ascites are seen. No acute bony abnormality is noted. IMPRESSION: No acute abnormality noted. Electronically Signed   By: Inez Catalina M.D.   On: 11/22/2017 17:36   US Biopsy (kidney)  Result Date: 11/17/2017 INDICATION: Acute worsening kidney function with clinical concern for Goodpasture's disease. Please perform ultrasound-guided random renal biopsy for tissue diagnostic purposes. EXAM: ULTRASOUND GUIDED RENAL BIOPSY COMPARISON:  Abdominal ultrasound - 09/04/2017 MEDICATIONS: None. ANESTHESIA/SEDATION: Fentanyl 100 mcg IV; Versed 2 mg  IV Total Moderate Sedation time: 10 minutes; The patient was continuously monitored during the procedure by the interventional radiology nurse under my direct supervision. COMPLICATIONS: None immediate. PROCEDURE: Informed written consent was obtained from the patient (via the use of a medical translator as well as translation from the patient's son) after a discussion of the risks, benefits and alternatives to treatment. The patient understands and consents the procedure. A timeout was performed prior to the initiation of the procedure. Ultrasound scanning was performed of the bilateral flanks. The inferior pole of the right kidney was selected for biopsy due to location and sonographic window. The procedure was planned. The operative site was prepped and draped in the usual sterile fashion. The overlying soft tissues were anesthetized with 1% lidocaine with epinephrine. A 17 gauge core needle biopsy device was advanced into the inferior cortex of the left kidney and 2 core biopsies were obtained under direct ultrasound guidance. Images were saved for documentation purposes. The biopsy device was removed and hemostasis was obtained with manual compression. Post procedural scanning was negative for significant post procedural hemorrhage or additional complication. A dressing was placed. The patient tolerated the procedure well without immediate post procedural complication. IMPRESSION: Technically successful ultrasound guided right renal biopsy. Electronically Signed   By: Sandi Mariscal M.D.   On: 11/17/2017 14:27   Ir Paracentesis  Result Date: 12/10/2017  INDICATION: Patient with abdominal distention, ascites. Request is made for diagnostic and therapeutic paracentesis. EXAM: ULTRASOUND GUIDED DIAGNOSTIC AND THERAPEUTIC PARACENTESIS MEDICATIONS: 10 mL 2% lidocaine COMPLICATIONS: None immediate. PROCEDURE: Informed written consent was obtained from the patient after a discussion of the risks, benefits and  alternatives to treatment. A timeout was performed prior to the initiation of the procedure. Initial ultrasound scanning demonstrates a moderate amount of ascites within the right lower abdominal quadrant. The right lower abdomen was prepped and draped in the usual sterile fashion. 2% lidocaine was used for local anesthesia. Following this, a 19 gauge, 7-cm, Yueh catheter was introduced. An ultrasound image was saved for documentation purposes. The paracentesis was performed. The catheter was removed and a dressing was applied. The patient tolerated the procedure well without immediate post procedural complication. FINDINGS: A total of approximately 3.8 liters of yellow, cloudy fluid was removed. Samples were sent to the laboratory as requested by the clinical team. IMPRESSION: Successful ultrasound-guided diagnostic and therapeutic paracentesis yielding 3.8 liters of peritoneal fluid. Read by: Brynda Greathouse PA-C Electronically Signed   By: Marybelle Killings M.D.   On: 12/10/2017 11:38    ASSESSMENT AND PLAN: This is a very pleasant 67 years old [place] male with pancytopenia secondary to autoimmune disease from recently diagnosed Goodpasture syndrome. He is followed by Dr. Posey Pronto from nephrology as well as Dr. Elsworth Soho from pulmonary medicine. His pancytopenia is secondary to his autoimmune disorder. He would definitely benefit from plasmapheresis as well as continuation of treatment with the steroids. He has no current bleeding issues and his total white blood count, hemoglobin and hematocrit as well as platelets count are within the acceptable range.  His management from hematology would be only supportive care at this point.  I do not see any need for transfusion today.  I recommended for him to continue on treatment with Amicar but I do not see any need for him to continue treatment with Nplate. For the liver cirrhosis, he is followed by Dr. Oletta Lamas. I will arrange for the patient to come back for follow-up  visit in 1 month for reevaluation and repeat blood work. The patient was advised to call immediately if he has any concerning symptoms in the interval. The patient voices understanding of current disease status and treatment options and is in agreement with the current care plan. All questions were answered. The patient knows to call the clinic with any problems, questions or concerns. We can certainly see the patient much sooner if necessary.  I spent 15 minutes counseling the patient face to face. The total time spent in the appointment was 25 minutes.  Disclaimer: This note was dictated with voice recognition software. Similar sounding words can inadvertently be transcribed and may not be corrected upon review.

## 2017-12-15 NOTE — Telephone Encounter (Signed)
Scheduled appt per 5/13 los - Gave patient aVS and calender per los.

## 2017-12-16 DIAGNOSIS — T1512XA Foreign body in conjunctival sac, left eye, initial encounter: Secondary | ICD-10-CM | POA: Diagnosis not present

## 2017-12-16 DIAGNOSIS — H4312 Vitreous hemorrhage, left eye: Secondary | ICD-10-CM | POA: Diagnosis not present

## 2017-12-16 DIAGNOSIS — H02055 Trichiasis without entropian left lower eyelid: Secondary | ICD-10-CM | POA: Diagnosis not present

## 2017-12-17 DIAGNOSIS — H4312 Vitreous hemorrhage, left eye: Secondary | ICD-10-CM | POA: Diagnosis not present

## 2017-12-17 DIAGNOSIS — H353213 Exudative age-related macular degeneration, right eye, with inactive scar: Secondary | ICD-10-CM | POA: Diagnosis not present

## 2017-12-17 DIAGNOSIS — H353122 Nonexudative age-related macular degeneration, left eye, intermediate dry stage: Secondary | ICD-10-CM | POA: Diagnosis not present

## 2017-12-17 DIAGNOSIS — E113512 Type 2 diabetes mellitus with proliferative diabetic retinopathy with macular edema, left eye: Secondary | ICD-10-CM | POA: Diagnosis not present

## 2017-12-18 DIAGNOSIS — R197 Diarrhea, unspecified: Secondary | ICD-10-CM | POA: Diagnosis not present

## 2017-12-18 DIAGNOSIS — D696 Thrombocytopenia, unspecified: Secondary | ICD-10-CM | POA: Diagnosis not present

## 2017-12-18 DIAGNOSIS — M310 Hypersensitivity angiitis: Secondary | ICD-10-CM | POA: Diagnosis not present

## 2017-12-18 DIAGNOSIS — R188 Other ascites: Secondary | ICD-10-CM | POA: Diagnosis not present

## 2017-12-18 DIAGNOSIS — D638 Anemia in other chronic diseases classified elsewhere: Secondary | ICD-10-CM | POA: Diagnosis not present

## 2017-12-18 DIAGNOSIS — K7460 Unspecified cirrhosis of liver: Secondary | ICD-10-CM | POA: Diagnosis not present

## 2017-12-18 DIAGNOSIS — N189 Chronic kidney disease, unspecified: Secondary | ICD-10-CM | POA: Diagnosis not present

## 2017-12-19 ENCOUNTER — Other Ambulatory Visit: Payer: Self-pay

## 2017-12-19 ENCOUNTER — Inpatient Hospital Stay (HOSPITAL_COMMUNITY)
Admission: EM | Admit: 2017-12-19 | Discharge: 2017-12-26 | DRG: 853 | Disposition: A | Discharge status: Home / Self Care | Payer: Medicare Other | Attending: Internal Medicine | Admitting: Internal Medicine

## 2017-12-19 ENCOUNTER — Emergency Department (HOSPITAL_COMMUNITY): Payer: Medicare Other

## 2017-12-19 ENCOUNTER — Encounter (HOSPITAL_COMMUNITY): Payer: Self-pay | Admitting: Emergency Medicine

## 2017-12-19 ENCOUNTER — Ambulatory Visit (INDEPENDENT_AMBULATORY_CARE_PROVIDER_SITE_OTHER)
Admission: EM | Admit: 2017-12-19 | Discharge: 2017-12-19 | Disposition: A | Discharge status: Home / Self Care | Payer: Medicare Other | Source: Home / Self Care

## 2017-12-19 DIAGNOSIS — R042 Hemoptysis: Secondary | ICD-10-CM | POA: Diagnosis present

## 2017-12-19 DIAGNOSIS — K7581 Nonalcoholic steatohepatitis (NASH): Secondary | ICD-10-CM | POA: Diagnosis present

## 2017-12-19 DIAGNOSIS — J9601 Acute respiratory failure with hypoxia: Secondary | ICD-10-CM | POA: Diagnosis present

## 2017-12-19 DIAGNOSIS — K219 Gastro-esophageal reflux disease without esophagitis: Secondary | ICD-10-CM | POA: Diagnosis present

## 2017-12-19 DIAGNOSIS — R062 Wheezing: Secondary | ICD-10-CM | POA: Diagnosis not present

## 2017-12-19 DIAGNOSIS — R0602 Shortness of breath: Secondary | ICD-10-CM

## 2017-12-19 DIAGNOSIS — Z833 Family history of diabetes mellitus: Secondary | ICD-10-CM

## 2017-12-19 DIAGNOSIS — N170 Acute kidney failure with tubular necrosis: Secondary | ICD-10-CM | POA: Diagnosis present

## 2017-12-19 DIAGNOSIS — K7460 Unspecified cirrhosis of liver: Secondary | ICD-10-CM | POA: Diagnosis present

## 2017-12-19 DIAGNOSIS — N185 Chronic kidney disease, stage 5: Secondary | ICD-10-CM | POA: Diagnosis not present

## 2017-12-19 DIAGNOSIS — D6959 Other secondary thrombocytopenia: Secondary | ICD-10-CM | POA: Diagnosis present

## 2017-12-19 DIAGNOSIS — E1122 Type 2 diabetes mellitus with diabetic chronic kidney disease: Secondary | ICD-10-CM | POA: Diagnosis present

## 2017-12-19 DIAGNOSIS — I132 Hypertensive heart and chronic kidney disease with heart failure and with stage 5 chronic kidney disease, or end stage renal disease: Secondary | ICD-10-CM | POA: Diagnosis not present

## 2017-12-19 DIAGNOSIS — A419 Sepsis, unspecified organism: Principal | ICD-10-CM | POA: Diagnosis present

## 2017-12-19 DIAGNOSIS — Y95 Nosocomial condition: Secondary | ICD-10-CM | POA: Diagnosis present

## 2017-12-19 DIAGNOSIS — R188 Other ascites: Secondary | ICD-10-CM | POA: Diagnosis not present

## 2017-12-19 DIAGNOSIS — J9621 Acute and chronic respiratory failure with hypoxia: Secondary | ICD-10-CM | POA: Diagnosis present

## 2017-12-19 DIAGNOSIS — A071 Giardiasis [lambliasis]: Secondary | ICD-10-CM | POA: Diagnosis not present

## 2017-12-19 DIAGNOSIS — Z794 Long term (current) use of insulin: Secondary | ICD-10-CM

## 2017-12-19 DIAGNOSIS — E875 Hyperkalemia: Secondary | ICD-10-CM

## 2017-12-19 DIAGNOSIS — D631 Anemia in chronic kidney disease: Secondary | ICD-10-CM | POA: Diagnosis not present

## 2017-12-19 DIAGNOSIS — E1121 Type 2 diabetes mellitus with diabetic nephropathy: Secondary | ICD-10-CM | POA: Diagnosis present

## 2017-12-19 DIAGNOSIS — D61818 Other pancytopenia: Secondary | ICD-10-CM | POA: Diagnosis not present

## 2017-12-19 DIAGNOSIS — I5032 Chronic diastolic (congestive) heart failure: Secondary | ICD-10-CM | POA: Diagnosis present

## 2017-12-19 DIAGNOSIS — N184 Chronic kidney disease, stage 4 (severe): Secondary | ICD-10-CM | POA: Diagnosis not present

## 2017-12-19 DIAGNOSIS — R0600 Dyspnea, unspecified: Secondary | ICD-10-CM

## 2017-12-19 DIAGNOSIS — K7210 Chronic hepatic failure without coma: Secondary | ICD-10-CM | POA: Diagnosis not present

## 2017-12-19 DIAGNOSIS — J690 Pneumonitis due to inhalation of food and vomit: Secondary | ICD-10-CM

## 2017-12-19 DIAGNOSIS — E1165 Type 2 diabetes mellitus with hyperglycemia: Secondary | ICD-10-CM | POA: Diagnosis present

## 2017-12-19 DIAGNOSIS — J189 Pneumonia, unspecified organism: Secondary | ICD-10-CM | POA: Diagnosis not present

## 2017-12-19 DIAGNOSIS — M310 Hypersensitivity angiitis: Secondary | ICD-10-CM | POA: Diagnosis present

## 2017-12-19 DIAGNOSIS — I10 Essential (primary) hypertension: Secondary | ICD-10-CM | POA: Diagnosis present

## 2017-12-19 DIAGNOSIS — E1129 Type 2 diabetes mellitus with other diabetic kidney complication: Secondary | ICD-10-CM | POA: Diagnosis present

## 2017-12-19 LAB — I-STAT CHEM 8, ED
BUN: 64 mg/dL — AB (ref 6–20)
BUN: 63 mg/dL — AB (ref 6–20)
CREATININE: 3.6 mg/dL — AB (ref 0.61–1.24)
Calcium, Ion: 0.83 mmol/L — CL (ref 1.15–1.40)
Calcium, Ion: 0.8 mmol/L — CL (ref 1.15–1.40)
Chloride: 103 mmol/L (ref 101–111)
Chloride: 105 mmol/L (ref 101–111)
Creatinine, Ser: 3.4 mg/dL — ABNORMAL HIGH (ref 0.61–1.24)
GLUCOSE: 60 mg/dL — AB (ref 65–99)
Glucose, Bld: 110 mg/dL — ABNORMAL HIGH (ref 65–99)
HCT: 33 % — ABNORMAL LOW (ref 39.0–52.0)
HEMATOCRIT: 26 % — AB (ref 39.0–52.0)
Hemoglobin: 11.2 g/dL — ABNORMAL LOW (ref 13.0–17.0)
Hemoglobin: 8.8 g/dL — ABNORMAL LOW (ref 13.0–17.0)
Potassium: 5.2 mmol/L — ABNORMAL HIGH (ref 3.5–5.1)
Potassium: 4.9 mmol/L (ref 3.5–5.1)
SODIUM: 140 mmol/L (ref 135–145)
Sodium: 140 mmol/L (ref 135–145)
TCO2: 23 mmol/L (ref 22–32)
TCO2: 24 mmol/L (ref 22–32)

## 2017-12-19 LAB — CBC WITH DIFFERENTIAL/PLATELET
ABS IMMATURE GRANULOCYTES: 0.1 10*3/uL (ref 0.0–0.1)
Basophils Absolute: 0 10*3/uL (ref 0.0–0.1)
Basophils Relative: 0 %
EOS PCT: 1 %
Eosinophils Absolute: 0.1 10*3/uL (ref 0.0–0.7)
HEMATOCRIT: 33.8 % — AB (ref 39.0–52.0)
HEMOGLOBIN: 10.9 g/dL — AB (ref 13.0–17.0)
Immature Granulocytes: 2 %
LYMPHS ABS: 1 10*3/uL (ref 0.7–4.0)
LYMPHS PCT: 14 %
MCH: 25.3 pg — ABNORMAL LOW (ref 26.0–34.0)
MCHC: 32.2 g/dL (ref 30.0–36.0)
MCV: 78.4 fL (ref 78.0–100.0)
Monocytes Absolute: 1 10*3/uL (ref 0.1–1.0)
Monocytes Relative: 14 %
Neutro Abs: 5 10*3/uL (ref 1.7–7.7)
Neutrophils Relative %: 69 %
Platelets: 89 10*3/uL — ABNORMAL LOW (ref 150–400)
RBC: 4.31 MIL/uL (ref 4.22–5.81)
RDW: 18.1 % — ABNORMAL HIGH (ref 11.5–15.5)
WBC: 7.3 10*3/uL (ref 4.0–10.5)

## 2017-12-19 LAB — COMPREHENSIVE METABOLIC PANEL
ALT: 27 U/L (ref 17–63)
AST: 35 U/L (ref 15–41)
Albumin: 3.4 g/dL — ABNORMAL LOW (ref 3.5–5.0)
Alkaline Phosphatase: 75 U/L (ref 38–126)
Anion gap: 12 (ref 5–15)
BUN: 81 mg/dL — ABNORMAL HIGH (ref 6–20)
CALCIUM: 7 mg/dL — AB (ref 8.9–10.3)
CHLORIDE: 104 mmol/L (ref 101–111)
CO2: 25 mmol/L (ref 22–32)
Creatinine, Ser: 4.16 mg/dL — ABNORMAL HIGH (ref 0.61–1.24)
GFR calc non Af Amer: 14 mL/min — ABNORMAL LOW (ref >60)
GFR, EST AFRICAN AMERICAN: 16 mL/min — AB (ref >60)
Glucose, Bld: 64 mg/dL — ABNORMAL LOW (ref 65–99)
Potassium: 5.4 mmol/L — ABNORMAL HIGH (ref 3.5–5.1)
SODIUM: 141 mmol/L (ref 135–145)
Total Bilirubin: 1.9 mg/dL — ABNORMAL HIGH (ref 0.3–1.2)
Total Protein: 6.1 g/dL — ABNORMAL LOW (ref 6.5–8.1)

## 2017-12-19 LAB — CBC
HCT: 28 % — ABNORMAL LOW (ref 39.0–52.0)
Hemoglobin: 8.9 g/dL — ABNORMAL LOW (ref 13.0–17.0)
MCH: 25.5 pg — ABNORMAL LOW (ref 26.0–34.0)
MCHC: 31.8 g/dL (ref 30.0–36.0)
MCV: 80.2 fL (ref 78.0–100.0)
Platelets: 59 10*3/uL — ABNORMAL LOW (ref 150–400)
RBC: 3.49 MIL/uL — AB (ref 4.22–5.81)
RDW: 18.4 % — ABNORMAL HIGH (ref 11.5–15.5)
WBC: 4.9 10*3/uL (ref 4.0–10.5)

## 2017-12-19 LAB — I-STAT CG4 LACTIC ACID, ED: LACTIC ACID, VENOUS: 1.07 mmol/L (ref 0.5–1.9)

## 2017-12-19 LAB — I-STAT TROPONIN, ED: TROPONIN I, POC: 0.04 ng/mL (ref 0.00–0.08)

## 2017-12-19 LAB — C DIFFICILE QUICK SCREEN W PCR REFLEX
C Diff antigen: NEGATIVE
C Diff interpretation: NOT DETECTED
C Diff toxin: NEGATIVE

## 2017-12-19 LAB — URINALYSIS, ROUTINE W REFLEX MICROSCOPIC
BACTERIA UA: NONE SEEN
BILIRUBIN URINE: NEGATIVE
Glucose, UA: NEGATIVE mg/dL
KETONES UR: NEGATIVE mg/dL
LEUKOCYTES UA: NEGATIVE
Nitrite: NEGATIVE
PROTEIN: 100 mg/dL — AB
SPECIFIC GRAVITY, URINE: 1.011 (ref 1.005–1.030)
pH: 7 (ref 5.0–8.0)

## 2017-12-19 LAB — LIPASE, BLOOD: Lipase: 25 U/L (ref 11–51)

## 2017-12-19 LAB — BRAIN NATRIURETIC PEPTIDE: B Natriuretic Peptide: 716.4 pg/mL — ABNORMAL HIGH (ref 0.0–100.0)

## 2017-12-19 MED ORDER — SODIUM CHLORIDE 0.9 % IV SOLN
1500.0000 mg | Freq: Once | INTRAVENOUS | Status: AC
  Administered 2017-12-19: 1500 mg via INTRAVENOUS
  Filled 2017-12-19: qty 1500

## 2017-12-19 MED ORDER — IPRATROPIUM-ALBUTEROL 0.5-2.5 (3) MG/3ML IN SOLN
3.0000 mL | Freq: Once | RESPIRATORY_TRACT | Status: AC
  Administered 2017-12-19: 3 mL via RESPIRATORY_TRACT
  Filled 2017-12-19 (×2): qty 3

## 2017-12-19 MED ORDER — FERROUS GLUCONATE 324 (38 FE) MG PO TABS
324.0000 mg | ORAL_TABLET | Freq: Every day | ORAL | Status: DC
  Administered 2017-12-20 – 2017-12-24 (×4): 324 mg via ORAL
  Filled 2017-12-19 (×11): qty 1

## 2017-12-19 MED ORDER — VANCOMYCIN HCL IN DEXTROSE 1-5 GM/200ML-% IV SOLN: 1000.0000 mg | INTRAVENOUS | Status: DC

## 2017-12-19 MED ORDER — SODIUM CHLORIDE 0.9 % IV BOLUS (SEPSIS)
1000.0000 mL | Freq: Once | INTRAVENOUS | Status: AC
  Administered 2017-12-19: 1000 mL via INTRAVENOUS

## 2017-12-19 MED ORDER — ACETAMINOPHEN 325 MG PO TABS
ORAL_TABLET | ORAL | Status: AC
  Filled 2017-12-19: qty 2

## 2017-12-19 MED ORDER — PIPERACILLIN-TAZOBACTAM IN DEX 2-0.25 GM/50ML IV SOLN
2.2500 g | Freq: Three times a day (TID) | INTRAVENOUS | Status: DC
  Administered 2017-12-19 – 2017-12-21 (×5): 2.25 g via INTRAVENOUS
  Filled 2017-12-19 (×7): qty 50

## 2017-12-19 MED ORDER — AMINOCAPROIC ACID 500 MG PO TABS
500.0000 mg | ORAL_TABLET | Freq: Three times a day (TID) | ORAL | Status: DC
  Administered 2017-12-20 – 2017-12-26 (×18): 500 mg via ORAL
  Filled 2017-12-19 (×24): qty 1

## 2017-12-19 MED ORDER — ACETAMINOPHEN 325 MG PO TABS
650.0000 mg | ORAL_TABLET | Freq: Once | ORAL | Status: AC
  Administered 2017-12-19: 650 mg via ORAL

## 2017-12-19 MED ORDER — PIPERACILLIN-TAZOBACTAM 3.375 G IVPB 30 MIN
3.3750 g | Freq: Once | INTRAVENOUS | Status: AC
  Administered 2017-12-19: 3.375 g via INTRAVENOUS
  Filled 2017-12-19: qty 50

## 2017-12-19 MED ORDER — SODIUM CHLORIDE 0.9 % IV BOLUS (SEPSIS)
250.0000 mL | Freq: Once | INTRAVENOUS | Status: AC
  Administered 2017-12-19: 250 mL via INTRAVENOUS

## 2017-12-19 MED ORDER — IPRATROPIUM-ALBUTEROL 0.5-2.5 (3) MG/3ML IN SOLN
RESPIRATORY_TRACT | Status: AC
  Filled 2017-12-19: qty 3

## 2017-12-19 MED ORDER — IPRATROPIUM-ALBUTEROL 0.5-2.5 (3) MG/3ML IN SOLN
3.0000 mL | Freq: Once | RESPIRATORY_TRACT | Status: AC
  Administered 2017-12-19: 3 mL via RESPIRATORY_TRACT

## 2017-12-19 MED ORDER — FUROSEMIDE 10 MG/ML IJ SOLN
120.0000 mg | Freq: Once | INTRAVENOUS | Status: AC
  Administered 2017-12-19: 120 mg via INTRAVENOUS
  Filled 2017-12-19: qty 2

## 2017-12-19 MED ORDER — FUROSEMIDE 10 MG/ML IJ SOLN: 40.0000 mg | Freq: Once | INTRAMUSCULAR | Status: DC

## 2017-12-19 MED ORDER — SODIUM BICARBONATE 650 MG PO TABS
1300.0000 mg | ORAL_TABLET | Freq: Two times a day (BID) | ORAL | Status: DC
  Administered 2017-12-20 – 2017-12-23 (×7): 1300 mg via ORAL
  Filled 2017-12-19 (×11): qty 2

## 2017-12-19 MED ORDER — VANCOMYCIN HCL IN DEXTROSE 1-5 GM/200ML-% IV SOLN: 1000.0000 mg | Freq: Once | INTRAVENOUS | Status: DC

## 2017-12-19 MED ORDER — INSULIN ASPART 100 UNIT/ML ~~LOC~~ SOLN
0.0000 [IU] | Freq: Three times a day (TID) | SUBCUTANEOUS | Status: DC
Start: 2017-12-20 — End: 2017-12-26
  Administered 2017-12-20: 3 [IU] via SUBCUTANEOUS
  Administered 2017-12-21 (×2): 15 [IU] via SUBCUTANEOUS
  Administered 2017-12-22: 3 [IU] via SUBCUTANEOUS
  Administered 2017-12-22: 15 [IU] via SUBCUTANEOUS
  Administered 2017-12-22: 11 [IU] via SUBCUTANEOUS
  Administered 2017-12-23: 15 [IU] via SUBCUTANEOUS
  Administered 2017-12-23: 3 [IU] via SUBCUTANEOUS
  Administered 2017-12-24 (×3): 5 [IU] via SUBCUTANEOUS
  Administered 2017-12-25: 2 [IU] via SUBCUTANEOUS
  Administered 2017-12-26: 8 [IU] via SUBCUTANEOUS
  Filled 2017-12-19: qty 1

## 2017-12-19 MED ORDER — PREDNISONE 20 MG PO TABS
40.0000 mg | ORAL_TABLET | Freq: Every day | ORAL | Status: DC
  Administered 2017-12-20 – 2017-12-23 (×4): 40 mg via ORAL
  Filled 2017-12-19 (×6): qty 2

## 2017-12-19 MED ORDER — FERROUS GLUCONATE 240 (27 FE) MG PO TABS: 240.0000 mg | ORAL_TABLET | Freq: Every day | ORAL | Status: DC

## 2017-12-19 NOTE — ED Triage Notes (Signed)
The patient presented to the [facility] with a complaint of SOB that has been recurring off and on. The patient had audible wheezing and retractions.

## 2017-12-19 NOTE — ED Notes (Signed)
Charge nurse Alphia Moh [place] notified of patients vitals

## 2017-12-19 NOTE — ED Provider Notes (Signed)
Patient placed in Quick Look pathway, seen and evaluated   Chief Complaint: Fever, shortness of breath and abdominal distension   HPI:   67 y.o. male history of cirrhosis who presents emergency department today for abdominal distention, fever and shortness of breath.  History helped obtained by patient's son.  He reports that the patient started developing fever, shortness of breath. He was found to be febrile, tachycardiac and hypoxic in the 80's on arrival. Patient placed on 4L o2 with improvement. No history of O2 requirement at home.  Son reports that patient is also been having increasing abdominal distention as well as pain.  He recently underwent a paracentesis on 5/8.  No reported history of SBP. No medications given prior to arrival.  ROS:  Positive ROS: (+) Fever, shortness of breath, abdominal distention Negative ROS: (-) Nausea, vomiting, diarrhea or urinary symptoms  Physical Exam:   Gen: Initially with mild respiratory distress is improved after placement of nasal  cannula  Neuro: Awake and Alert  Skin: Warm  Focused Exam: Heart tachycardic with regular rhythm.  Diffuse rhonchi and wheezing of the upper lung fields bilaterally.  Abdomen is distended with generalized tenderness.  No rebound or guarding.  No lower extremity edema.  BP (!) 172/84 (BP Location: Left Arm)   Pulse (!) 113   Temp (!) 102.2 F (39 C) (Oral)   Resp 16   SpO2 96%   Plan: Patient with history of cirrhosis and recently underwent paracentesis presented today with fever, shortness of breath and increasing abdominal distention.  Patient found to be tachycardic, febrile and hypoxic on arrival. Patient meets sirs criteria + suspect source of infection. Code sepsis called.  Broad spectrum antibiotics ordered.  Chest x-ray and CT of the abdomen ordered.  Recommended room ASAP.   Initiation of care has begun. The patient has been counseled on the process, plan, and necessity for staying for the  completion/evaluation, and the remainder of the medical screening examination    Jillyn Ledger, Hershal Coria 12/19/17 1420

## 2017-12-19 NOTE — H&P (Signed)
History and Physical    William [name] EFE:[id number] DOB: 08/11/1950 DOA: 12/19/2017  PCP: Gildardo Pounds, NP  Patient coming from: home   Chief Complaint: cough and shortness of breath  HPI: William [name] is a 67 y.o. male with medical history significant for recent diagnosis goodpasture syndrome, pancytopenia, cirrhosis with ascites, dCHF, CKD4 not on dialysis currently, and diabetes, with recent hospitalizations, who presents brought in by son for the above.  2 admissions Last month. Initially presented with AKI on ckd with volume overload and hyoxic respiratory failure. Readmitted for hemoptysis. Has been diagnosed with goodpasture's disease. Was treated with cytoxan and outpatient plasmapheresis. Inpatient treated with corticosteroids, plasmapheresis, and dialysis. Also received multiple blood transfusions. Cytopenias thought to be 2/2 goodpasture's. Cytoxan stopped and discharged on steroids. Dialysis stopped. Had u/s guided paracentesis as outpt on 5/8 with 3.8 L removed. Followed up with Dr. Earlie Server oncology; also followed by dr. Posey Pronto with nephrology and dr. Oletta Lamas for cirrhosis.  Patient brought to ED by his son. HIstory obtained by ED physician mainly from son. Son not available during my eval and with translator phone patient drowsy but arousable, did not answer my questions. Per ED report patient presents with one day nonproductive cough and shortness of breath as well as fevers. No nausea or vomiting. Complains of abdominal distention but no pain. Does report diarrhea since hospitalization last month. Report not clear as to whether bloody    ED Course: fluid resuscitation, lasix 120, nephro consult, labs, zosyn/vancomycin  Review of Systems: As per HPI otherwise 10 point review of systems negative.    Past Medical History:  Diagnosis Date  . Anemia   . CHF (congestive heart failure) ([place])   . Cirrhosis ([place])   . Cirrhosis ([place])   . CKD (chronic kidney disease), stage IV  ([place])   . Goodpasture's disease [facility])    on outpatient plasmapheresis/notes 11/20/2017  . Grade I diastolic dysfunction 83/25/4982  . History of anemia due to chronic kidney disease   . History of blood transfusion X 1   "UGIB; low blood count"  . History of plasmapheresis    "qod" (11/20/2017)  . Hypertension   . Pancytopenia ([place]) 08/20/2017  . Type II diabetes mellitus ([place])     Past Surgical History:  Procedure Laterality Date  . CATARACT EXTRACTION W/ INTRAOCULAR LENS  IMPLANT, BILATERAL Bilateral   . IR FLUORO GUIDE CV LINE RIGHT  11/10/2017  . IR PARACENTESIS  12/10/2017  . IR REMOVAL TUN CV CATH W/O FL  11/28/2017  . IR US GUIDE VASC ACCESS RIGHT  11/10/2017  . NECK SURGERY  2007   "back of neck; no hardware in there"     reports that he has never smoked. He has never used smokeless tobacco. He reports that he does not drink alcohol or use drugs.  Allergies  Allergen Reactions  . Heparin     Patient is Muslim and is not permitted Pork derative due to religious belief)    Family History  Problem Relation Age of Onset  . Diabetes Mellitus II Sister   . Stroke Brother     Prior to Admission medications   Medication Sig Start Date End Date Taking? Authorizing Provider  acetaminophen (TYLENOL) 650 MG CR tablet Take 650 mg by mouth every 8 (eight) hours as needed for pain.   Yes [provider]  aminocaproic acid (AMICAR) 500 MG tablet Take 1 tablet (500 mg total) by mouth every 8 (eight) hours. 12/15/17 01/14/18 Yes Curt Bears,  MD  amLODipine (NORVASC) 10 MG tablet Take 1 tablet (10 mg total) daily by mouth. 06/20/17 11/06/24 Yes Gildardo Pounds, NP  carvedilol (COREG) 12.5 MG tablet Take 12.5 mg by mouth 2 (two) times daily with a meal.   Yes [provider]  ferrous gluconate (IRON 27) 240 (27 FE) MG tablet Take 240 mg by mouth daily.    Yes [provider]  furosemide (LASIX) 40 MG tablet Take 1 tablet (40 mg total) by mouth 2 (two) times  daily. 11/18/17 11/18/18 Yes Regalado, Belkys A, MD  insulin NPH-regular Human (NOVOLIN 70/30) (70-30) 100 UNIT/ML injection Inject 18 Units into the skin 2 (two) times daily with a meal. 11/18/17  Yes Regalado, Belkys A, MD  Multiple Vitamins-Minerals (MULTIVITAMIN WITH MINERALS) tablet Take 1 tablet by mouth daily.   Yes [provider]  predniSONE (DELTASONE) 20 MG tablet Take 2 tablets (40 mg total) by mouth daily with breakfast. Patient taking differently: Take 40 mg by mouth daily with lunch.  11/30/17  Yes Dessa Phi, DO  sodium bicarbonate 650 MG tablet Take 2 tablets (1,300 mg total) by mouth 2 (two) times daily. 11/18/17  Yes Regalado, Belkys A, MD  spironolactone (ALDACTONE) 50 MG tablet Take 50 mg by mouth daily. 12/09/17  Yes [provider]  dextromethorphan-guaiFENesin (MUCINEX DM) 30-600 MG 12hr tablet Take 1 tablet by mouth 2 (two) times daily as needed for cough. Patient not taking: Reported on 12/15/2017 11/18/17   Elmarie Shiley, MD    Physical Exam: Vitals:   12/19/17 1715 12/19/17 1730 12/19/17 1745 12/19/17 1800  BP: (!) 157/76 (!) 156/75 (!) 161/84 134/79  Pulse: 94 93 96 92  Resp: 20 (!) 21 (!) 24 19  Temp:      TempSrc:      SpO2: 99% 99% 95% 94%    Constitutional: chronically ill appearing Head: Atraumatic Eyes: Conjunctiva clear ENM: Moist mucous membranes. poordentition.  Neck: Supple Respiratory: scattered rhonchi throughout. Rales and decreased breath sounds at bases. Poor inspiratory effort. Cardiovascular: Regular rate and rhythm. Soft systolic murmur Abdomen: Non-tender. Severely distended. No rebound or guarding. Musculoskeletal: No joint deformity upper and lower extremities. Normal ROM, no contractures. Decreased muscle tone Skin: LE edema pitting to mid calf  Extremities: Palpable peripheral pulses. Neurologic: drowsy but arousable, moving all 4 extremities. Psychiatric: does not respond intelligibly to my questions   Labs on  Admission: I have personally reviewed following labs and imaging studies  CBC: Recent Labs  Lab 12/15/17 1508 12/19/17 1414 12/19/17 1421 12/19/17 1938  WBC 3.3* 7.3  --   --   NEUTROABS 2.2 5.0  --   --   HGB 10.0* 10.9* 11.2* 8.8*  HCT 30.7* 33.8* 33.0* 26.0*  MCV 79.9 78.4  --   --   PLT 78* 89*  --   --    Basic Metabolic Panel: Recent Labs  Lab 12/15/17 1508 12/19/17 1414 12/19/17 1421 12/19/17 1938  NA 142 141 140 140  K 4.1 5.4* 5.2* 4.9  CL 106 104 103 105  CO2 27 25  --   --   GLUCOSE 109 64* 60* 110*  BUN 83* 81* 64* 63*  CREATININE 3.68* 4.16* 3.60* 3.40*  CALCIUM 6.8* 7.0*  --   --    GFR: Estimated Creatinine Clearance: 18.6 mL/min (A) (by C-G formula based on SCr of 3.4 mg/dL (H)). Liver Function Tests: Recent Labs  Lab 12/15/17 1508 12/19/17 1414  AST 27 35  ALT 26 27  ALKPHOS 102 75  BILITOT 0.6 1.9*  PROT 5.8* 6.1*  ALBUMIN 3.4* 3.4*   Recent Labs  Lab 12/19/17 1414  LIPASE 25   No results for input(s): AMMONIA in the last 168 hours. Coagulation Profile: No results for input(s): INR, PROTIME in the last 168 hours. Cardiac Enzymes: No results for input(s): CKTOTAL, CKMB, CKMBINDEX, TROPONINI in the last 168 hours. BNP (last 3 results) Recent Labs    09/25/17 1614  PROBNP 380.0*   HbA1C: No results for input(s): HGBA1C in the last 72 hours. CBG: No results for input(s): GLUCAP in the last 168 hours. Lipid Profile: No results for input(s): CHOL, HDL, LDLCALC, TRIG, CHOLHDL, LDLDIRECT in the last 72 hours. Thyroid Function Tests: No results for input(s): TSH, T4TOTAL, FREET4, T3FREE, THYROIDAB in the last 72 hours. Anemia Panel: No results for input(s): VITAMINB12, FOLATE, FERRITIN, TIBC, IRON, RETICCTPCT in the last 72 hours. Urine analysis:    Component Value Date/Time   COLORURINE YELLOW 11/10/2017 1412   APPEARANCEUR HAZY (A) 11/10/2017 1412   LABSPEC 1.014 11/10/2017 1412   PHURINE 5.0 11/10/2017 1412   GLUCOSEU 50 (A)  11/10/2017 1412   HGBUR SMALL (A) 11/10/2017 1412   BILIRUBINUR NEGATIVE 11/10/2017 1412   BILIRUBINUR neg 09/14/2015 1441   KETONESUR NEGATIVE 11/10/2017 1412   PROTEINUR >=300 (A) 11/10/2017 1412   UROBILINOGEN 0.2 09/14/2015 1441   UROBILINOGEN 0.2 01/27/2013 2052   NITRITE NEGATIVE 11/10/2017 1412   LEUKOCYTESUR NEGATIVE 11/10/2017 1412    Radiological Exams on Admission: Ct Abdomen Pelvis Wo Contrast  Result Date: 12/19/2017 CLINICAL DATA:  67 year old male with shortness of breath, fever and abdominal distension. History of cirrhosis. EXAM: CT CHEST, ABDOMEN AND PELVIS WITHOUT CONTRAST TECHNIQUE: Multidetector CT imaging of the chest, abdomen and pelvis was performed following the standard protocol without IV contrast. COMPARISON:  12/06/2017 abdominal/pelvic CT and 11/20/2017 chest CT FINDINGS: Please note that parenchymal abnormalities may be missed without intravenous contrast. CT CHEST FINDINGS Cardiovascular: Cardiomegaly and coronary/thoracic aortic atherosclerotic calcifications again noted. A small pericardial effusion is unchanged. There is no evidence of thoracic aortic aneurysm. Mediastinum/Nodes: No enlarged mediastinal, hilar, or axillary lymph nodes. Thyroid gland, trachea, and esophagus demonstrate no significant findings. Lungs/Pleura: Diffuse bilateral airspace opacities, primarily central, are noted likely representing edema but infection/superimposed infection is not excluded. Small bilateral pleural effusions are noted. Bibasilar atelectasis identified. There is no evidence of pneumothorax. Musculoskeletal: No acute abnormality CT ABDOMEN PELVIS FINDINGS Hepatobiliary: Cirrhosis changes identified. No gross focal hepatic abnormality noted. No definite gallbladder abnormality. No biliary dilatation. Pancreas: Unremarkable Spleen: Splenomegaly again identified. Adrenals/Urinary Tract: The kidneys, adrenal glands and bladder are unremarkable. Stomach/Bowel: Stomach is within  normal limits. Appendix is unremarkable. No evidence of bowel wall thickening, distention, or inflammatory changes. Vascular/Lymphatic: Aortic atherosclerosis. No enlarged abdominal or pelvic lymph nodes. Reproductive: Prostate enlargement again noted Other: A moderate to large amount of ascites is again noted and relatively unchanged. No evidence of pneumoperitoneum. Subcutaneous edema identified. Musculoskeletal: No acute or suspicious bony abnormalities. IMPRESSION: 1. Diffuse bilateral airspace opacities likely representing pulmonary edema. Infection/superimposed infection not excluded. 2. Moderate to large amount of ascites again noted with small bilateral pleural effusions and diffuse subcutaneous edema. 3. Cirrhosis and splenomegaly. 4. Cardiomegaly and small pericardial effusion. 5. Coronary artery and Aortic Atherosclerosis (ICD10-I70.0). 6. Prostatomegaly. Electronically Signed   By: Margarette Canada M.D.   On: 12/19/2017 17:44   Dg Chest 2 View  Result Date: 12/19/2017 CLINICAL DATA:  SOB/hypoxia since this morning - hx of CHF, htn,  diabetes, cirrhosis, kidney disease, fluid removal EXAM: CHEST - 2 VIEW COMPARISON:  11/19/2017 FINDINGS: The heart is mildly enlarged. There are prominent opacities throughout the lungs, similar in appearance to the previous exams. No new consolidations or pleural effusions. RIGHT central line has been removed. IMPRESSION: Persistent bilateral lung opacities. Electronically Signed   By: Nolon Nations M.D.   On: 12/19/2017 16:10   Ct Chest Wo Contrast  Result Date: 12/19/2017 CLINICAL DATA:  67 year old male with shortness of breath, fever and abdominal distension. History of cirrhosis. EXAM: CT CHEST, ABDOMEN AND PELVIS WITHOUT CONTRAST TECHNIQUE: Multidetector CT imaging of the chest, abdomen and pelvis was performed following the standard protocol without IV contrast. COMPARISON:  12/06/2017 abdominal/pelvic CT and 11/20/2017 chest CT FINDINGS: Please note that  parenchymal abnormalities may be missed without intravenous contrast. CT CHEST FINDINGS Cardiovascular: Cardiomegaly and coronary/thoracic aortic atherosclerotic calcifications again noted. A small pericardial effusion is unchanged. There is no evidence of thoracic aortic aneurysm. Mediastinum/Nodes: No enlarged mediastinal, hilar, or axillary lymph nodes. Thyroid gland, trachea, and esophagus demonstrate no significant findings. Lungs/Pleura: Diffuse bilateral airspace opacities, primarily central, are noted likely representing edema but infection/superimposed infection is not excluded. Small bilateral pleural effusions are noted. Bibasilar atelectasis identified. There is no evidence of pneumothorax. Musculoskeletal: No acute abnormality CT ABDOMEN PELVIS FINDINGS Hepatobiliary: Cirrhosis changes identified. No gross focal hepatic abnormality noted. No definite gallbladder abnormality. No biliary dilatation. Pancreas: Unremarkable Spleen: Splenomegaly again identified. Adrenals/Urinary Tract: The kidneys, adrenal glands and bladder are unremarkable. Stomach/Bowel: Stomach is within normal limits. Appendix is unremarkable. No evidence of bowel wall thickening, distention, or inflammatory changes. Vascular/Lymphatic: Aortic atherosclerosis. No enlarged abdominal or pelvic lymph nodes. Reproductive: Prostate enlargement again noted Other: A moderate to large amount of ascites is again noted and relatively unchanged. No evidence of pneumoperitoneum. Subcutaneous edema identified. Musculoskeletal: No acute or suspicious bony abnormalities. IMPRESSION: 1. Diffuse bilateral airspace opacities likely representing pulmonary edema. Infection/superimposed infection not excluded. 2. Moderate to large amount of ascites again noted with small bilateral pleural effusions and diffuse subcutaneous edema. 3. Cirrhosis and splenomegaly. 4. Cardiomegaly and small pericardial effusion. 5. Coronary artery and Aortic Atherosclerosis  (ICD10-I70.0). 6. Prostatomegaly. Electronically Signed   By: Margarette Canada M.D.   On: 12/19/2017 17:44    EKG: pending  Assessment/Plan Active Problems:   Type II diabetes mellitus with renal manifestations (HCC)   Pancytopenia (HCC)   Essential hypertension   Goodpasture's syndrome (HCC) with associated hemoptysis   Cirrhosis of liver with ascites (HCC)   CKD (chronic kidney disease) stage 4, GFR 15-29 ml/min (HCC)   Hyperkalemia   Healthcare-associated pneumonia   Ascites   # Acute healthcare-associated pneumonia - unfortunately unable to obtain accurate history as patient not cooperative w/ questioning and son not available. Symptoms do appear to be consistent with pneumonia as does exam, fever, and is with acute hypoxic respiratory failure. CT also shows pleural effusions and has known ascites 2/2 cirrhosis. Other causes of fever could be c diff as recent hospitalizations and report of diarrhea. Urinalysis and blood cultures are also pending. Abdomen non-tender, but SBP would be worth pursuing if does not respond to current therapy. - continue vancomycin/zosyn, wean as tolerated - step-down - f/u blood culture, urinalysis, and c diff screen - holding IV fluids given trial of diuresis - f/u respiratory panel  # Acute hypoxic respiratory failure - O2 88-90% on 5 L [place]. No increased work of breathing. Treating infection as above. Given cirrhosis and advanced ckd fluid overload could  play a role. Nephrology advising aggressive diuresis with lasix 120 mg IV - insert catheter - strict Is and Os - step-down - bipap ordered - continue - f/u abg after bipap  # Goodpasture syndrome # CKD 4  # Hyperkalemia # thrombocytopenia - nephro consulted, appreciate recs. K initially elevated but has resolved to wnl on re-check. Thrombocytopenia stable and above 50; thought to be reactive 2/2 goodpasture. Hemoglobin stable at 10.9. - IV diuresis as above - continue home prednisone and amicar -  appreciate nephrology recs  # Cirrhosis - followed as outpt - consider diagnostic/therapeutic paracentesis as above  # diastolic CHF - could play role with above processes - consider TTE if volume overload persists despite above treatments  # DM - here initial glucose low at 64, improved to 110 - SSI, hold home insulin  # HTN - think for the time being fluid is primary driver of htn; also with infectious process - hold home amlodipine and coreg - hold home lasix 40 mg po bid; IV lasix as above    DVT prophylaxis: SCDs Code Status: full  Family Communication: patient's son  Disposition Plan: TBD  Consults called: nephrology  Admission status: step-down    Desma Maxim MD [facility] Pager [phone]  If 7PM-7AM, please contact night-coverage [email or web address] Password [facility]  12/19/2017, 8:47 PM

## 2017-12-19 NOTE — Progress Notes (Signed)
Pharmacy Antibiotic Note  William [name] is a 67 y.o. male admitted on 12/19/2017 with shortness of breath.  Pharmacy has been consulted  Vancomycin and Zosyn dosing for sepsis.  Zosyn 3.375 g IV x1 has been ordered to give now in ED.  H/o AKI.  SCr 3.67 Weight 71 kg  Plan: Zosyn  3.375 gm IV x1 then 2.25 g IV q6h  Vancomycin 1500 mg IV x1 now then 1000 mg IV q48h, target vanc trough 15-20 mcg/ml   Temp (24hrs), Avg:102 F (38.9 C), Min:101.8 F (38.8 C), Max:102.2 F (39 C)  Recent Labs  Lab 12/15/17 1508  WBC 3.3*  CREATININE 3.68*    Estimated Creatinine Clearance: 17.2 mL/min (A) (by C-G formula based on SCr of 3.68 mg/dL [facility])).    Allergies  Allergen Reactions  . Heparin     Patient is Muslim and is not permitted Pork derative due to religious belief)    Antimicrobials this admission: Vancomycin 5/17>> Zosyn 5/17>>  Dose adjustments this admission:   Microbiology results: 5/17 [id number]   Thank you for allowing pharmacy to be a part of this patient's care.  Nicole Cella, RPh Clinical Pharmacist 12/19/2017 2:17 PM

## 2017-12-19 NOTE — ED Provider Notes (Signed)
[place] EMERGENCY DEPARTMENT Provider Note  CSN: [id number] Arrival date & time: 12/19/17 1358  Chief Complaint(s) Shortness of Breath  HPI William [name] is a 67 y.o. male with extensive past medical history listed below including hypertension, diabetes, diastolic heart failure, CKD (FSGS), cirrhosis, Goodpasture's disease (off of Cytoxan) who was admitted for Goodpasture's exacerbation in April due to hemoptysis.  Patient was taken off Cytoxan and placed on prednisone.  He presents today with 1 day of nonproductive cough and shortness of breath with subjective fevers.  He denies any nausea or vomiting.  Endorses abdominal distention due to ascites but no pain.  Patient has been having diarrhea since being hospitalized last month.  No urinary symptoms.  No headache or nuchal rigidity.  No skin infections.  HPI  Past Medical History Past Medical History:  Diagnosis Date  . Anemia   . CHF (congestive heart failure) ([place])   . Cirrhosis ([place])   . Cirrhosis ([place])   . CKD (chronic kidney disease), stage IV ([place])   . Goodpasture's disease [facility])    on outpatient plasmapheresis/notes 11/20/2017  . Grade I diastolic dysfunction 23/55/7322  . History of anemia due to chronic kidney disease   . History of blood transfusion X 1   "UGIB; low blood count"  . History of plasmapheresis    "qod" (11/20/2017)  . Hypertension   . Pancytopenia ([place]) 08/20/2017  . Type II diabetes mellitus [facility])    Patient Active Problem List   Diagnosis Date Noted  . Cirrhosis of liver with ascites ([place]) 12/09/2017  . Pulmonary infiltrates   . Goodpasture's syndrome ([place]) with associated hemoptysis 11/20/2017  . Asymmetric edema of both lower extremities 11/20/2017  . Metabolic acidosis 02/54/2706  . Hemoptysis   . AKI (acute kidney injury) ([place]) 11/07/2017  . Acute on chronic diastolic CHF (congestive heart failure) ([place]) 11/06/2017  . Pancytopenia ([place]) 11/06/2017  . Acute renal failure  superimposed on stage 4 chronic kidney disease ([place]) 11/06/2017  . GERD (gastroesophageal reflux disease) 11/06/2017  . Essential hypertension 11/06/2017  . Dyspnea on exertion 09/25/2017  . Type II diabetes mellitus with renal manifestations ([place]) 08/25/2015  . Chronic cough 06/10/2013  . Chronic neck pain 06/10/2013   Home Medication(s) Prior to Admission medications   Medication Sig Start Date End Date Taking? Authorizing Provider  acetaminophen (TYLENOL) 650 MG CR tablet Take 650 mg by mouth every 8 (eight) hours as needed for pain.    [provider]  aminocaproic acid (AMICAR) 500 MG tablet Take 1 tablet (500 mg total) by mouth every 8 (eight) hours. 12/15/17 01/14/18  Curt Bears, MD  amLODipine (NORVASC) 10 MG tablet Take 1 tablet (10 mg total) daily by mouth. 06/20/17 11/06/24  Gildardo Pounds, NP  carvedilol (COREG) 12.5 MG tablet Take 12.5 mg by mouth 2 (two) times daily with a meal.    [provider]  dextromethorphan-guaiFENesin (MUCINEX DM) 30-600 MG 12hr tablet Take 1 tablet by mouth 2 (two) times daily as needed for cough. Patient not taking: Reported on 12/15/2017 11/18/17   Regalado, Jerald Kief A, MD  ferrous gluconate (IRON 27) 240 (27 FE) MG tablet Take 240 mg by mouth daily.     [provider]  furosemide (LASIX) 40 MG tablet Take 1 tablet (40 mg total) by mouth 2 (two) times daily. 11/18/17 11/18/18  Regalado, Belkys A, MD  insulin NPH-regular Human (NOVOLIN 70/30) (70-30) 100 UNIT/ML injection Inject 18 Units into the skin 2 (two) times daily with a  meal. 11/18/17   Regalado, Belkys A, MD  Multiple Vitamins-Minerals (MULTIVITAMIN WITH MINERALS) tablet Take 1 tablet by mouth daily.    [provider]  predniSONE (DELTASONE) 20 MG tablet Take 2 tablets (40 mg total) by mouth daily with breakfast. Patient taking differently: Take 40 mg by mouth daily with lunch.  11/30/17   Dessa Phi, DO  sodium bicarbonate 650 MG tablet Take 2 tablets  (1,300 mg total) by mouth 2 (two) times daily. 11/18/17   Regalado, Belkys A, MD  spironolactone (ALDACTONE) 50 MG tablet Take 50 mg by mouth daily. 12/09/17   [provider]                                                                                                                                    Past Surgical History Past Surgical History:  Procedure Laterality Date  . CATARACT EXTRACTION W/ INTRAOCULAR LENS  IMPLANT, BILATERAL Bilateral   . IR FLUORO GUIDE CV LINE RIGHT  11/10/2017  . IR PARACENTESIS  12/10/2017  . IR REMOVAL TUN CV CATH W/O FL  11/28/2017  . IR US GUIDE VASC ACCESS RIGHT  11/10/2017  . NECK SURGERY  2007   "back of neck; no hardware in there"   Family History Family History  Problem Relation Age of Onset  . Diabetes Mellitus II Sister   . Stroke Brother     Social History Social History   Tobacco Use  . Smoking status: Never Smoker  . Smokeless tobacco: Never Used  Substance Use Topics  . Alcohol use: Never    Frequency: Never  . Drug use: Never   Allergies Heparin  Review of Systems Review of Systems All other systems are reviewed and are negative for acute change except as noted in the HPI  Physical Exam Vital Signs  I have reviewed the triage vital signs BP (!) 172/84 (BP Location: Left Arm)   Pulse (!) 113   Temp (!) 102.2 F (39 C) (Oral)   Resp 16   SpO2 96%   Physical Exam  Constitutional: He is oriented to person, place, and time. He appears well-developed and well-nourished. No distress.  HENT:  Head: Normocephalic and atraumatic.  Nose: Nose normal.  Eyes: Pupils are equal, round, and reactive to light. Conjunctivae and EOM are normal. Right eye exhibits no discharge. Left eye exhibits no discharge. No scleral icterus.  Neck: Normal range of motion. Neck supple.  Cardiovascular: Normal rate and regular rhythm. Exam reveals no gallop and no friction rub.  No murmur heard. Pulmonary/Chest: Effort normal. No stridor. No  respiratory distress. He has rales in the right lower field.  Abdominal: Soft. He exhibits no distension. There is no tenderness.  Musculoskeletal: He exhibits no edema or tenderness.  Neurological: He is alert and oriented to person, place, and time.  Skin: Skin is warm and dry. No rash noted. He is not diaphoretic. No erythema.  Psychiatric:  He has a normal mood and affect.  Vitals reviewed.   ED Results and Treatments Labs (all labs ordered are listed, but only abnormal results are displayed) Labs Reviewed  COMPREHENSIVE METABOLIC PANEL - Abnormal; Notable for the following components:      Result Value   Potassium 5.4 (*)    Glucose, Bld 64 (*)    BUN 81 (*)    Creatinine, Ser 4.16 (*)    Calcium 7.0 (*)    Total Protein 6.1 (*)    Albumin 3.4 (*)    Total Bilirubin 1.9 (*)    GFR calc non Af Amer 14 (*)    GFR calc Af Amer 16 (*)    All other components within normal limits  CBC WITH DIFFERENTIAL/PLATELET - Abnormal; Notable for the following components:   Hemoglobin 10.9 (*)    HCT 33.8 (*)    MCH 25.3 (*)    RDW 18.1 (*)    Platelets 89 (*)    All other components within normal limits  I-STAT CHEM 8, ED - Abnormal; Notable for the following components:   Potassium 5.2 (*)    BUN 64 (*)    Creatinine, Ser 3.60 (*)    Glucose, Bld 60 (*)    Calcium, Ion 0.83 (*)    Hemoglobin 11.2 (*)    HCT 33.0 (*)    All other components within normal limits  CULTURE, BLOOD (ROUTINE X 2)  CULTURE, BLOOD (ROUTINE X 2)  RESPIRATORY PANEL BY PCR  GASTROINTESTINAL PANEL BY PCR, STOOL (REPLACES STOOL CULTURE)  C DIFFICILE QUICK SCREEN W PCR REFLEX  LIPASE, BLOOD  URINALYSIS, ROUTINE W REFLEX MICROSCOPIC  URINALYSIS, COMPLETE (UACMP) WITH MICROSCOPIC  I-STAT CG4 LACTIC ACID, ED  I-STAT TROPONIN, ED  I-STAT CG4 LACTIC ACID, ED                                                                                                                         EKG  EKG  Interpretation  Date/Time:    Ventricular Rate:    PR Interval:    QRS Duration:   QT Interval:    QTC Calculation:   R Axis:     Text Interpretation:        Radiology Ct Abdomen Pelvis Wo Contrast  Result Date: 12/19/2017 CLINICAL DATA:  67 year old male with shortness of breath, fever and abdominal distension. History of cirrhosis. EXAM: CT CHEST, ABDOMEN AND PELVIS WITHOUT CONTRAST TECHNIQUE: Multidetector CT imaging of the chest, abdomen and pelvis was performed following the standard protocol without IV contrast. COMPARISON:  12/06/2017 abdominal/pelvic CT and 11/20/2017 chest CT FINDINGS: Please note that parenchymal abnormalities may be missed without intravenous contrast. CT CHEST FINDINGS Cardiovascular: Cardiomegaly and coronary/thoracic aortic atherosclerotic calcifications again noted. A small pericardial effusion is unchanged. There is no evidence of thoracic aortic aneurysm. Mediastinum/Nodes: No enlarged mediastinal, hilar, or axillary lymph nodes. Thyroid gland, trachea, and esophagus demonstrate no significant findings. Lungs/Pleura: Diffuse bilateral airspace opacities, primarily central, are  noted likely representing edema but infection/superimposed infection is not excluded. Small bilateral pleural effusions are noted. Bibasilar atelectasis identified. There is no evidence of pneumothorax. Musculoskeletal: No acute abnormality CT ABDOMEN PELVIS FINDINGS Hepatobiliary: Cirrhosis changes identified. No gross focal hepatic abnormality noted. No definite gallbladder abnormality. No biliary dilatation. Pancreas: Unremarkable Spleen: Splenomegaly again identified. Adrenals/Urinary Tract: The kidneys, adrenal glands and bladder are unremarkable. Stomach/Bowel: Stomach is within normal limits. Appendix is unremarkable. No evidence of bowel wall thickening, distention, or inflammatory changes. Vascular/Lymphatic: Aortic atherosclerosis. No enlarged abdominal or pelvic lymph nodes.  Reproductive: Prostate enlargement again noted Other: A moderate to large amount of ascites is again noted and relatively unchanged. No evidence of pneumoperitoneum. Subcutaneous edema identified. Musculoskeletal: No acute or suspicious bony abnormalities. IMPRESSION: 1. Diffuse bilateral airspace opacities likely representing pulmonary edema. Infection/superimposed infection not excluded. 2. Moderate to large amount of ascites again noted with small bilateral pleural effusions and diffuse subcutaneous edema. 3. Cirrhosis and splenomegaly. 4. Cardiomegaly and small pericardial effusion. 5. Coronary artery and Aortic Atherosclerosis (ICD10-I70.0). 6. Prostatomegaly. Electronically Signed   By: Margarette Canada M.D.   On: 12/19/2017 17:44   Dg Chest 2 View  Result Date: 12/19/2017 CLINICAL DATA:  SOB/hypoxia since this morning - hx of CHF, htn, diabetes, cirrhosis, kidney disease, fluid removal EXAM: CHEST - 2 VIEW COMPARISON:  11/19/2017 FINDINGS: The heart is mildly enlarged. There are prominent opacities throughout the lungs, similar in appearance to the previous exams. No new consolidations or pleural effusions. RIGHT central line has been removed. IMPRESSION: Persistent bilateral lung opacities. Electronically Signed   By: Nolon Nations M.D.   On: 12/19/2017 16:10   Ct Chest Wo Contrast  Result Date: 12/19/2017 CLINICAL DATA:  67 year old male with shortness of breath, fever and abdominal distension. History of cirrhosis. EXAM: CT CHEST, ABDOMEN AND PELVIS WITHOUT CONTRAST TECHNIQUE: Multidetector CT imaging of the chest, abdomen and pelvis was performed following the standard protocol without IV contrast. COMPARISON:  12/06/2017 abdominal/pelvic CT and 11/20/2017 chest CT FINDINGS: Please note that parenchymal abnormalities may be missed without intravenous contrast. CT CHEST FINDINGS Cardiovascular: Cardiomegaly and coronary/thoracic aortic atherosclerotic calcifications again noted. A small pericardial  effusion is unchanged. There is no evidence of thoracic aortic aneurysm. Mediastinum/Nodes: No enlarged mediastinal, hilar, or axillary lymph nodes. Thyroid gland, trachea, and esophagus demonstrate no significant findings. Lungs/Pleura: Diffuse bilateral airspace opacities, primarily central, are noted likely representing edema but infection/superimposed infection is not excluded. Small bilateral pleural effusions are noted. Bibasilar atelectasis identified. There is no evidence of pneumothorax. Musculoskeletal: No acute abnormality CT ABDOMEN PELVIS FINDINGS Hepatobiliary: Cirrhosis changes identified. No gross focal hepatic abnormality noted. No definite gallbladder abnormality. No biliary dilatation. Pancreas: Unremarkable Spleen: Splenomegaly again identified. Adrenals/Urinary Tract: The kidneys, adrenal glands and bladder are unremarkable. Stomach/Bowel: Stomach is within normal limits. Appendix is unremarkable. No evidence of bowel wall thickening, distention, or inflammatory changes. Vascular/Lymphatic: Aortic atherosclerosis. No enlarged abdominal or pelvic lymph nodes. Reproductive: Prostate enlargement again noted Other: A moderate to large amount of ascites is again noted and relatively unchanged. No evidence of pneumoperitoneum. Subcutaneous edema identified. Musculoskeletal: No acute or suspicious bony abnormalities. IMPRESSION: 1. Diffuse bilateral airspace opacities likely representing pulmonary edema. Infection/superimposed infection not excluded. 2. Moderate to large amount of ascites again noted with small bilateral pleural effusions and diffuse subcutaneous edema. 3. Cirrhosis and splenomegaly. 4. Cardiomegaly and small pericardial effusion. 5. Coronary artery and Aortic Atherosclerosis (ICD10-I70.0). 6. Prostatomegaly. Electronically Signed   By: Margarette Canada M.D.   On: 12/19/2017  17:44   Pertinent labs & imaging results that were available during my care of the patient were reviewed by me and  considered in my medical decision making (see chart for details).  Medications Ordered in ED Medications  sodium chloride 0.9 % bolus 1,000 mL (1,000 mLs Intravenous New Bag/Given 12/19/17 1650)    And  sodium chloride 0.9 % bolus 1,000 mL (1,000 mLs Intravenous New Bag/Given 12/19/17 1651)    And  sodium chloride 0.9 % bolus 250 mL (has no administration in time range)  ipratropium-albuterol (DUONEB) 0.5-2.5 (3) MG/3ML nebulizer solution 3 mL (3 mLs Nebulization Not Given 12/19/17 1611)  vancomycin (VANCOCIN) 1,500 mg in sodium chloride 0.9 % 500 mL IVPB (1,500 mg Intravenous New Bag/Given 12/19/17 1643)  piperacillin-tazobactam (ZOSYN) IVPB 3.375 g (3.375 g Intravenous New Bag/Given 12/19/17 1643)                                                                                                                                    Procedures Procedures CRITICAL CARE Performed by: Grayce Sessions Cardama Total critical care time: 45 minutes Critical care time was exclusive of separately billable procedures and treating other patients. Critical care was necessary to treat or prevent imminent or life-threatening deterioration. Critical care was time spent personally by me on the following activities: development of treatment plan with patient and/or surrogate as well as nursing, discussions with consultants, evaluation of patient's response to treatment, examination of patient, obtaining history from patient or surrogate, ordering and performing treatments and interventions, ordering and review of laboratory studies, ordering and review of radiographic studies, pulse oximetry and re-evaluation of patient's condition.   (including critical care time)  Medical Decision Making / ED Course I have reviewed the nursing notes for this encounter and the patient's prior records (if available in EHR or on provided paperwork).     Patient is febrile, tachycardic and hypertensive.  Hypoxic on room air requiring  4 L nasal cannula.  Patient was seen in first look process and appropriate septic labs ordered.  Code sepsis was initiated and patient was started on empiric antibiotics for unknown etiology.  Given his symptomatology, there is a concern for possible hospital associated pneumonia.  His abdomen is benign so I have low suspicion for SBP.  Will obtain a UA.  Given the patient's diarrhea, suspicion for possible C. difficile but it does not appear that the patient was on any recent antibiotics.  Will send stool.   During ED stay, patient's oxygen required continue to increase.  Patient had an increase rales diffusely concerning for pulmonary edema.  This was noted on a Noncon CT scan.  History of diastolic heart failure.  BNP added.  Given his increased demand, patient was placed on BiPAP.  Case was discussed with medicine who will admit the patient for continued work-up and management.  They requested that we touch base with nephrology.   Given his renal  insufficiency, nephrology was consulted who recommended high-dose Lasix as well.       Final Clinical Impression(s) / ED Diagnoses Final diagnoses:  HCAP (healthcare-associated pneumonia)  Sepsis, due to unspecified organism [facility])  Acute respiratory failure with hypoxia ([place])      This chart was dictated using voice recognition software.  Despite best efforts to proofread,  errors can occur which can change the documentation meaning.   Fatima Blank, MD 12/19/17 2010

## 2017-12-19 NOTE — ED Triage Notes (Addendum)
Patient arrives with complaint of shortness of breath that started this morning. Does not wear oxygen at baseline, arrives with 6L [place], SpO2 98%. Denies chest pain, wheezing audible. History of cirrhosis with ascites, last paracentesis 12/10/2017.

## 2017-12-19 NOTE — ED Notes (Signed)
Per DR. Meda Coffee, pt needs to go to the ER, Per md, pt safe to go by wheelchair and on o2

## 2017-12-20 ENCOUNTER — Other Ambulatory Visit: Payer: Self-pay

## 2017-12-20 DIAGNOSIS — Z794 Long term (current) use of insulin: Secondary | ICD-10-CM | POA: Diagnosis not present

## 2017-12-20 DIAGNOSIS — J9601 Acute respiratory failure with hypoxia: Secondary | ICD-10-CM

## 2017-12-20 DIAGNOSIS — J9621 Acute and chronic respiratory failure with hypoxia: Secondary | ICD-10-CM | POA: Diagnosis not present

## 2017-12-20 DIAGNOSIS — M310 Hypersensitivity angiitis: Secondary | ICD-10-CM | POA: Diagnosis not present

## 2017-12-20 DIAGNOSIS — N184 Chronic kidney disease, stage 4 (severe): Secondary | ICD-10-CM | POA: Diagnosis not present

## 2017-12-20 DIAGNOSIS — N170 Acute kidney failure with tubular necrosis: Secondary | ICD-10-CM | POA: Diagnosis not present

## 2017-12-20 DIAGNOSIS — A419 Sepsis, unspecified organism: Secondary | ICD-10-CM | POA: Diagnosis not present

## 2017-12-20 DIAGNOSIS — I10 Essential (primary) hypertension: Secondary | ICD-10-CM | POA: Diagnosis not present

## 2017-12-20 DIAGNOSIS — J189 Pneumonia, unspecified organism: Secondary | ICD-10-CM | POA: Diagnosis not present

## 2017-12-20 DIAGNOSIS — E1122 Type 2 diabetes mellitus with diabetic chronic kidney disease: Secondary | ICD-10-CM | POA: Diagnosis not present

## 2017-12-20 DIAGNOSIS — R188 Other ascites: Secondary | ICD-10-CM | POA: Diagnosis not present

## 2017-12-20 LAB — CBG MONITORING, ED
GLUCOSE-CAPILLARY: 130 mg/dL — AB (ref 65–99)
Glucose-Capillary: 138 mg/dL — ABNORMAL HIGH (ref 65–99)
Glucose-Capillary: 165 mg/dL — ABNORMAL HIGH (ref 65–99)

## 2017-12-20 LAB — I-STAT ARTERIAL BLOOD GAS, ED
ACID-BASE DEFICIT: 3 mmol/L — AB (ref 0.0–2.0)
BICARBONATE: 20.7 mmol/L (ref 20.0–28.0)
O2 SAT: 93 %
TCO2: 22 mmol/L (ref 22–32)
pCO2 arterial: 29.6 mmHg — ABNORMAL LOW (ref 32.0–48.0)
pH, Arterial: 7.453 — ABNORMAL HIGH (ref 7.350–7.450)
pO2, Arterial: 63 mmHg — ABNORMAL LOW (ref 83.0–108.0)

## 2017-12-20 LAB — RESPIRATORY PANEL BY PCR

## 2017-12-20 LAB — GASTROINTESTINAL PANEL BY PCR, STOOL (REPLACES STOOL CULTURE)
Adenovirus F40/41: NOT DETECTED
Astrovirus: NOT DETECTED
CAMPYLOBACTER SPECIES: NOT DETECTED
CRYPTOSPORIDIUM: NOT DETECTED
Cyclospora cayetanensis: NOT DETECTED
E. COLI O157: NOT DETECTED
ENTEROAGGREGATIVE E COLI (EAEC): NOT DETECTED
Entamoeba histolytica: NOT DETECTED
Enteropathogenic E coli (EPEC): NOT DETECTED
Enterotoxigenic E coli (ETEC): NOT DETECTED
GIARDIA LAMBLIA: DETECTED — AB
NOROVIRUS GI/GII: NOT DETECTED
PLESIMONAS SHIGELLOIDES: NOT DETECTED
ROTAVIRUS A: NOT DETECTED
SALMONELLA SPECIES: NOT DETECTED
SAPOVIRUS (I, II, IV, AND V): NOT DETECTED
SHIGA LIKE TOXIN PRODUCING E COLI (STEC): NOT DETECTED
SHIGELLA/ENTEROINVASIVE E COLI (EIEC): NOT DETECTED
Vibrio cholerae: NOT DETECTED
Vibrio species: NOT DETECTED
YERSINIA ENTEROCOLITICA: NOT DETECTED

## 2017-12-20 LAB — CBC
HCT: 31.1 % — ABNORMAL LOW (ref 39.0–52.0)
Hemoglobin: 9.8 g/dL — ABNORMAL LOW (ref 13.0–17.0)
MCH: 25.1 pg — ABNORMAL LOW (ref 26.0–34.0)
MCHC: 31.5 g/dL (ref 30.0–36.0)
MCV: 79.7 fL (ref 78.0–100.0)
PLATELETS: 81 10*3/uL — AB (ref 150–400)
RBC: 3.9 MIL/uL — AB (ref 4.22–5.81)
RDW: 18.6 % — ABNORMAL HIGH (ref 11.5–15.5)
WBC: 5.2 10*3/uL (ref 4.0–10.5)

## 2017-12-20 LAB — BASIC METABOLIC PANEL
Anion gap: 15 (ref 5–15)
BUN: 77 mg/dL — AB (ref 6–20)
CALCIUM: 6.5 mg/dL — AB (ref 8.9–10.3)
CO2: 22 mmol/L (ref 22–32)
CREATININE: 3.99 mg/dL — AB (ref 0.61–1.24)
Chloride: 104 mmol/L (ref 101–111)
GFR calc Af Amer: 17 mL/min — ABNORMAL LOW (ref >60)
GFR, EST NON AFRICAN AMERICAN: 14 mL/min — AB (ref >60)
GLUCOSE: 128 mg/dL — AB (ref 65–99)
POTASSIUM: 5.3 mmol/L — AB (ref 3.5–5.1)
SODIUM: 141 mmol/L (ref 135–145)

## 2017-12-20 LAB — MRSA PCR SCREENING: MRSA by PCR: NEGATIVE

## 2017-12-20 LAB — GLUCOSE, CAPILLARY: Glucose-Capillary: 123 mg/dL — ABNORMAL HIGH (ref 65–99)

## 2017-12-20 MED ORDER — AMLODIPINE BESYLATE 10 MG PO TABS
10.0000 mg | ORAL_TABLET | Freq: Every day | ORAL | Status: DC
  Administered 2017-12-20 – 2017-12-25 (×6): 10 mg via ORAL
  Filled 2017-12-20 (×2): qty 1
  Filled 2017-12-20: qty 2
  Filled 2017-12-20 (×3): qty 1

## 2017-12-20 MED ORDER — SPIRONOLACTONE 50 MG PO TABS: 50.0000 mg | ORAL_TABLET | Freq: Every day | ORAL | Status: DC

## 2017-12-20 MED ORDER — FUROSEMIDE 10 MG/ML IJ SOLN
40.0000 mg | Freq: Two times a day (BID) | INTRAMUSCULAR | Status: DC
  Administered 2017-12-20: 40 mg via INTRAVENOUS
  Filled 2017-12-20 (×2): qty 4

## 2017-12-20 MED ORDER — CARVEDILOL 12.5 MG PO TABS
12.5000 mg | ORAL_TABLET | Freq: Two times a day (BID) | ORAL | Status: DC
  Administered 2017-12-20 – 2017-12-25 (×11): 12.5 mg via ORAL
  Filled 2017-12-20 (×11): qty 1

## 2017-12-20 MED ORDER — ACETAMINOPHEN 325 MG PO TABS
650.0000 mg | ORAL_TABLET | Freq: Four times a day (QID) | ORAL | Status: DC | PRN
  Administered 2017-12-20: 650 mg via ORAL
  Filled 2017-12-20 (×2): qty 2

## 2017-12-20 NOTE — Progress Notes (Signed)
Pt transported to [id number] without complications

## 2017-12-20 NOTE — Progress Notes (Signed)
Patient ID: William [name], male   DOB: June 03, 1951, 67 y.o.   MRN: [id number]                                                                PROGRESS NOTE                                                                                                                                                                                                             Patient Demographics:    William [name], is a 67 y.o. male, DOB - 07/27/1951, YBW:[id number]  Admit date - 12/19/2017   Admitting Physician No admitting provider for patient encounter.  Outpatient Primary MD for the patient is Gildardo Pounds, NP  LOS - 1  Outpatient Specialists:   Dr. Julien Nordmann oncology Dr. Posey Pronto Nephrology Dr. Oletta Lamas GI   Chief Complaint  Patient presents with  . Shortness of Breath       Brief Narrative     67 y.o. male with medical history significant for recent diagnosis goodpasture syndrome, pancytopenia, cirrhosis with ascites, dCHF, CKD4 not on dialysis currently, and diabetes, with recent hospitalizations, who presents brought in by son for the above.  2 admissions Last month. Initially presented with AKI on ckd with volume overload and hyoxic respiratory failure. Readmitted for hemoptysis. Has been diagnosed with goodpasture's disease. Was treated with cytoxan and outpatient plasmapheresis. Inpatient treated with corticosteroids, plasmapheresis, and dialysis. Also received multiple blood transfusions. Cytopenias thought to be 2/2 goodpasture's. Cytoxan stopped and discharged on steroids. Dialysis stopped. Had u/s guided paracentesis as outpt on 5/8 with 3.8 L removed. Followed up with Dr. Earlie Server oncology; also followed by dr. Posey Pronto with nephrology and dr. Oletta Lamas for cirrhosis.  Patient brought to ED by his son. HIstory obtained by ED physician mainly from son. Son not available during my eval and with translator phone patient drowsy but arousable, did not answer my questions. Per ED report patient presents with  one day nonproductive cough and shortness of breath as well as fevers. No nausea or vomiting. Complains of abdominal distention but no pain. Does report diarrhea since hospitalization last month. Report not clear as to whether bloody   ED Course: fluid resuscitation, lasix 120, nephro consult, labs, zosyn/vancomycin      Subjective:    William [name] today feels  improved. Dyspnea improved.  Denies fever, chills, cough, cp, palp, lower ext edema. Trial off bipap today  No headache, No abdominal pain - No Nausea, No new weakness tingling or numbness,    Assessment  & Plan :    Active Problems:   Type II diabetes mellitus with renal manifestations (HCC)   Pancytopenia ([place])   Essential hypertension   Goodpasture's syndrome (HCC) with associated hemoptysis   Cirrhosis of liver with ascites (HCC)   CKD (chronic kidney disease) stage 4, GFR 15-29 ml/min (HCC)   Hyperkalemia   Healthcare-associated pneumonia   Ascites    Acute healthcare-associated pneumonia - Per admitting physician-  -unfortunately unable to obtain accurate history as patient not cooperative w/ questioning and son not available. Symptoms do appear to be consistent with pneumonia as does exam, fever, and is with acute hypoxic respiratory failure. CT also shows pleural effusions and has known ascites 2/2 cirrhosis. Other causes of fever could be c diff as recent hospitalizations and report of diarrhea. Urinalysis and blood cultures are also pending. Abdomen non-tender, but SBP would be worth pursuing if does not respond to current therapy. Vanco iv, zosyn iv given in ED. Respiratory panel negative Blood culture x2 pending Change to Vanco iv pharmacy to dose Cefepime iv pharmacy to dose   Acute hypoxic respiratory failure - O2 88-90% on 5 L [place]. No increased work of breathing. Treating infection as above. Given cirrhosis and advanced ckd fluid overload could play a role. Nephrology advising aggressive diuresis with  lasix 120 mg IV Cont Bipap, trial off later today  Goodpasture syndrome CKD 4  Hyperkalemia Nephrology consulted  Anemia/Thrombocytopenia  stable and above 50; thought to be reactive 2/2 goodpasture. Hemoglobin stable at 10.9. Continue home prednisone and amica Appreciate nephrology input  Cirrhosis - followed as outpt - consider diagnostic/therapeutic paracentesis as above  diastolic CHF - could play role with above processes - consider TTE if volume overload persists despite above treatments  DM - here initial glucose low at 64, improved to 110 - SSI, hold home insulin  HTN - think for the time being fluid is primary driver of htn; also with infectious process Resume home medications  hold home lasix 40 mg po bid; IV lasix today    DVT prophylaxis: SCDs Code Status: full  Family Communication: patient's son  Disposition Plan: TBD  Consults called: nephrology  Admission status: step-down      Lab Results  Component Value Date   PLT 81 (L) 12/20/2017    Antibiotics  :  See below   Anti-infectives (From admission, onward)   Start     Dose/Rate Route Frequency Ordered Stop   12/21/17 2200  vancomycin (VANCOCIN) IVPB 1000 mg/200 mL premix     1,000 mg 200 mL/hr over 60 Minutes Intravenous Every 48 hours 12/19/17 2211     12/19/17 2215  piperacillin-tazobactam (ZOSYN) IVPB 2.25 g     2.25 g 100 mL/hr over 30 Minutes Intravenous Every 8 hours 12/19/17 2211     12/19/17 1445  vancomycin (VANCOCIN) 1,500 mg in sodium chloride 0.9 % 500 mL IVPB     1,500 mg 250 mL/hr over 120 Minutes Intravenous  Once 12/19/17 1428 12/19/17 1843   12/19/17 1415  piperacillin-tazobactam (ZOSYN) IVPB 3.375 g     3.375 g 100 mL/hr over 30 Minutes Intravenous  Once 12/19/17 1410 12/19/17 1713   12/19/17 1415  vancomycin (VANCOCIN) IVPB 1000 mg/200 mL premix  Status:  Discontinued     1,000 mg  200 mL/hr over 60 Minutes Intravenous  Once 12/19/17 1410 12/19/17 1428         Objective:   Vitals:   12/20/17 0515 12/20/17 0530 12/20/17 0545 12/20/17 0600  BP: (!) 165/72 (!) 171/73 (!) 177/81 (!) 163/74  Pulse: 95 96 (!) 107 96  Resp: 16 17 (!) 26 16  Temp:      TempSrc:      SpO2: 93% 95% 97% 96%    Wt Readings from Last 3 Encounters:  12/15/17 71.1 kg (156 lb 11.2 oz)  12/09/17 71.6 kg (157 lb 12.8 oz)  11/28/17 75 kg (165 lb 5.5 oz)     Intake/Output Summary (Last 24 hours) at 12/20/2017 0827 Last data filed at 12/20/2017 2951 Gross per 24 hour  Intake 2612 ml  Output 1175 ml  Net 1437 ml     Physical Exam  Awake Alert, Oriented X 3, No new F.N deficits, Normal affect [place].AT,PERRAL Supple Neck,No JVD, No cervical lymphadenopathy appriciated.  Symmetrical Chest wall movement, Good air movement bilaterally, slight wheezing, bibasilar crackles.  RRR,No Gallops,Rubs or new Murmurs, No Parasternal Heave +ve B.Sounds, Abd Soft, No tenderness, No organomegaly appriciated, No rebound - guarding or rigidity. No Cyanosis, Clubbing or edema, No new Rash or bruise      Data Review:    CBC Recent Labs  Lab 12/15/17 1508 12/19/17 1414 12/19/17 1421 12/19/17 1938 12/19/17 2230 12/20/17 0458  WBC 3.3* 7.3  --   --  4.9 5.2  HGB 10.0* 10.9* 11.2* 8.8* 8.9* 9.8*  HCT 30.7* 33.8* 33.0* 26.0* 28.0* 31.1*  PLT 78* 89*  --   --  59* 81*  MCV 79.9 78.4  --   --  80.2 79.7  MCH 26.0* 25.3*  --   --  25.5* 25.1*  MCHC 32.6 32.2  --   --  31.8 31.5  RDW 18.2* 18.1*  --   --  18.4* 18.6*  LYMPHSABS 0.5* 1.0  --   --   --   --   MONOABS 0.5 1.0  --   --   --   --   EOSABS 0.0 0.1  --   --   --   --   BASOSABS 0.0 0.0  --   --   --   --     Chemistries  Recent Labs  Lab 12/15/17 1508 12/19/17 1414 12/19/17 1421 12/19/17 1938 12/20/17 0458  NA 142 141 140 140 141  K 4.1 5.4* 5.2* 4.9 5.3*  CL 106 104 103 105 104  CO2 27 25  --   --  22  GLUCOSE 109 64* 60* 110* 128*  BUN 83* 81* 64* 63* 77*  CREATININE 3.68* 4.16* 3.60* 3.40* 3.99*  CALCIUM  6.8* 7.0*  --   --  6.5*  AST 27 35  --   --   --   ALT 26 27  --   --   --   ALKPHOS 102 75  --   --   --   BILITOT 0.6 1.9*  --   --   --    ------------------------------------------------------------------------------------------------------------------ No results for input(s): CHOL, HDL, LDLCALC, TRIG, CHOLHDL, LDLDIRECT in the last 72 hours.  Lab Results  Component Value Date   HGBA1C 8.1 06/20/2017   ------------------------------------------------------------------------------------------------------------------ No results for input(s): TSH, T4TOTAL, T3FREE, THYROIDAB in the last 72 hours.  Invalid input(s): FREET3 ------------------------------------------------------------------------------------------------------------------ No results for input(s): VITAMINB12, FOLATE, FERRITIN, TIBC, IRON, RETICCTPCT in the last 72 hours.  Coagulation profile  No results for input(s): INR, PROTIME in the last 168 hours.  No results for input(s): DDIMER in the last 72 hours.  Cardiac Enzymes No results for input(s): CKMB, TROPONINI, MYOGLOBIN in the last 168 hours.  Invalid input(s): CK ------------------------------------------------------------------------------------------------------------------    Component Value Date/Time   BNP 716.4 (H) 12/19/2017 1414    Inpatient Medications  Scheduled Meds: . aminocaproic acid  500 mg Oral Q8H  . ferrous gluconate  324 mg Oral Q breakfast  . insulin aspart  0-15 Units Subcutaneous TID WC  . predniSONE  40 mg Oral Q breakfast  . sodium bicarbonate  1,300 mg Oral BID   Continuous Infusions: . piperacillin-tazobactam (ZOSYN)  IV 2.25 g (12/20/17 0606)  . [START ON 12/21/2017] vancomycin     PRN Meds:.  Micro Results Recent Results (from the past 240 hour(s))  C difficile quick scan w PCR reflex     Status: None   Collection Time: 12/19/17  6:47 PM  Result Value Ref Range Status   C Diff antigen NEGATIVE NEGATIVE Final   C Diff  toxin NEGATIVE NEGATIVE Final   C Diff interpretation No C. difficile detected.  Final    Comment: Performed at [facility], [place] [address] [place], [place] [address]  Respiratory Panel by PCR     Status: None   Collection Time: 12/19/17 11:16 PM  Result Value Ref Range Status   Adenovirus NOT DETECTED NOT DETECTED Final   Coronavirus 229E NOT DETECTED NOT DETECTED Final   Coronavirus HKU1 NOT DETECTED NOT DETECTED Final   Coronavirus NL63 NOT DETECTED NOT DETECTED Final   Coronavirus OC43 NOT DETECTED NOT DETECTED Final   Metapneumovirus NOT DETECTED NOT DETECTED Final   Rhinovirus / Enterovirus NOT DETECTED NOT DETECTED Final   Influenza A NOT DETECTED NOT DETECTED Final   Influenza B NOT DETECTED NOT DETECTED Final   Parainfluenza Virus 1 NOT DETECTED NOT DETECTED Final   Parainfluenza Virus 2 NOT DETECTED NOT DETECTED Final   Parainfluenza Virus 3 NOT DETECTED NOT DETECTED Final   Parainfluenza Virus 4 NOT DETECTED NOT DETECTED Final   Respiratory Syncytial Virus NOT DETECTED NOT DETECTED Final   Bordetella pertussis NOT DETECTED NOT DETECTED Final   Chlamydophila pneumoniae NOT DETECTED NOT DETECTED Final   Mycoplasma pneumoniae NOT DETECTED NOT DETECTED Final    Radiology Reports Ct Abdomen Pelvis Wo Contrast  Result Date: 12/19/2017 CLINICAL DATA:  67 year old male with shortness of breath, fever and abdominal distension. History of cirrhosis. EXAM: CT CHEST, ABDOMEN AND PELVIS WITHOUT CONTRAST TECHNIQUE: Multidetector CT imaging of the chest, abdomen and pelvis was performed following the standard protocol without IV contrast. COMPARISON:  12/06/2017 abdominal/pelvic CT and 11/20/2017 chest CT FINDINGS: Please note that parenchymal abnormalities may be missed without intravenous contrast. CT CHEST FINDINGS Cardiovascular: Cardiomegaly and coronary/thoracic aortic atherosclerotic calcifications again noted. A small pericardial effusion is unchanged. There is no evidence of  thoracic aortic aneurysm. Mediastinum/Nodes: No enlarged mediastinal, hilar, or axillary lymph nodes. Thyroid gland, trachea, and esophagus demonstrate no significant findings. Lungs/Pleura: Diffuse bilateral airspace opacities, primarily central, are noted likely representing edema but infection/superimposed infection is not excluded. Small bilateral pleural effusions are noted. Bibasilar atelectasis identified. There is no evidence of pneumothorax. Musculoskeletal: No acute abnormality CT ABDOMEN PELVIS FINDINGS Hepatobiliary: Cirrhosis changes identified. No gross focal hepatic abnormality noted. No definite gallbladder abnormality. No biliary dilatation. Pancreas: Unremarkable Spleen: Splenomegaly again identified. Adrenals/Urinary Tract: The kidneys, adrenal glands and bladder are unremarkable. Stomach/Bowel: Stomach is within normal  limits. Appendix is unremarkable. No evidence of bowel wall thickening, distention, or inflammatory changes. Vascular/Lymphatic: Aortic atherosclerosis. No enlarged abdominal or pelvic lymph nodes. Reproductive: Prostate enlargement again noted Other: A moderate to large amount of ascites is again noted and relatively unchanged. No evidence of pneumoperitoneum. Subcutaneous edema identified. Musculoskeletal: No acute or suspicious bony abnormalities. IMPRESSION: 1. Diffuse bilateral airspace opacities likely representing pulmonary edema. Infection/superimposed infection not excluded. 2. Moderate to large amount of ascites again noted with small bilateral pleural effusions and diffuse subcutaneous edema. 3. Cirrhosis and splenomegaly. 4. Cardiomegaly and small pericardial effusion. 5. Coronary artery and Aortic Atherosclerosis (ICD10-I70.0). 6. Prostatomegaly. Electronically Signed   By: Margarette Canada M.D.   On: 12/19/2017 17:44   Ct Abdomen Pelvis Wo Contrast  Result Date: 12/06/2017 CLINICAL DATA:  Abdominal distention and diarrhea. EXAM: CT ABDOMEN AND PELVIS WITHOUT CONTRAST  TECHNIQUE: Multidetector CT imaging of the abdomen and pelvis was performed following the standard protocol without IV contrast. COMPARISON:  Abdominal ultrasound dated September 04, 2017. FINDINGS: Lower chest: Bronchiectasis and scarring at both lung bases. Mild interlobular septal thickening. Hepatobiliary: Small, nodular liver with prominent periportal edema. No focal liver abnormality. The gallbladder is unremarkable. Pancreas: Atrophic. No ductal dilatation or surrounding inflammatory changes. Spleen: Moderately enlarged.  No focal abnormality. Adrenals/Urinary Tract: Adrenal glands are unremarkable. Moderate bilateral renal atrophy. No renal or ureteral calculi. No hydronephrosis. Stomach/Bowel: Stomach is within normal limits. Appendix appears normal. No evidence of bowel wall thickening, distention, or inflammatory changes. Vascular/Lymphatic: Splenic and possibly gastric varices. No enlarged abdominal or pelvic lymph nodes. Reproductive: Mild prostatomegaly. Other: Moderate ascites.  No pneumoperitoneum. Musculoskeletal: No acute or significant osseous findings. IMPRESSION: 1. Cirrhosis with sequelae of portal hypertension including moderate splenomegaly and ascites. Splenic and possibly gastric varices. 2. Moderate bilateral renal atrophy. 3. Bronchiectasis and scarring at both lung bases, likely postinflammatory/postinfectious. 4. Mild pulmonary interstitial edema at the lung bases. Electronically Signed   By: Titus Dubin M.D.   On: 12/06/2017 18:16   Dg Chest 2 View  Result Date: 12/19/2017 CLINICAL DATA:  SOB/hypoxia since this morning - hx of CHF, htn, diabetes, cirrhosis, kidney disease, fluid removal EXAM: CHEST - 2 VIEW COMPARISON:  11/19/2017 FINDINGS: The heart is mildly enlarged. There are prominent opacities throughout the lungs, similar in appearance to the previous exams. No new consolidations or pleural effusions. RIGHT central line has been removed. IMPRESSION: Persistent bilateral  lung opacities. Electronically Signed   By: Nolon Nations M.D.   On: 12/19/2017 16:10   Dg Abdomen 1 View  Result Date: 12/03/2017 CLINICAL DATA:  Hematemesis. Constipation, abdominal pain and diarrhea. EXAM: ABDOMEN - 1 VIEW COMPARISON:  Abdominal radiograph November 26, 2017 FINDINGS: Paucity of large bowel gas. Multiple loops of nondistended gas-filled small bowel somewhat centrally displaced seen with ascites. Hazy appearance the abdomen. No radio-opaque calculi or other significant radiographic abnormality are seen. IMPRESSION: Nonspecific bowel gas pattern. Hazy appearance of abdomen seen with ascites. Electronically Signed   By: Elon Alas M.D.   On: 12/03/2017 16:15   Dg Abd 1 View  Result Date: 11/26/2017 CLINICAL DATA:  Abdominal distension EXAM: ABDOMEN - 1 VIEW COMPARISON:  None. FINDINGS: There is moderate stool in the colon. There is no appreciable bowel dilatation or air-fluid level to suggest bowel obstruction. No evident free air. There is a benign exostosis arising from the midportion of the right iliac crest laterally. IMPRESSION: No bowel obstruction or free air evident.  Moderate stool in colon. Electronically Signed   By:  Lowella Grip III M.D.   On: 11/26/2017 13:22   Ct Chest Wo Contrast  Result Date: 12/19/2017 CLINICAL DATA:  68 year old male with shortness of breath, fever and abdominal distension. History of cirrhosis. EXAM: CT CHEST, ABDOMEN AND PELVIS WITHOUT CONTRAST TECHNIQUE: Multidetector CT imaging of the chest, abdomen and pelvis was performed following the standard protocol without IV contrast. COMPARISON:  12/06/2017 abdominal/pelvic CT and 11/20/2017 chest CT FINDINGS: Please note that parenchymal abnormalities may be missed without intravenous contrast. CT CHEST FINDINGS Cardiovascular: Cardiomegaly and coronary/thoracic aortic atherosclerotic calcifications again noted. A small pericardial effusion is unchanged. There is no evidence of thoracic aortic  aneurysm. Mediastinum/Nodes: No enlarged mediastinal, hilar, or axillary lymph nodes. Thyroid gland, trachea, and esophagus demonstrate no significant findings. Lungs/Pleura: Diffuse bilateral airspace opacities, primarily central, are noted likely representing edema but infection/superimposed infection is not excluded. Small bilateral pleural effusions are noted. Bibasilar atelectasis identified. There is no evidence of pneumothorax. Musculoskeletal: No acute abnormality CT ABDOMEN PELVIS FINDINGS Hepatobiliary: Cirrhosis changes identified. No gross focal hepatic abnormality noted. No definite gallbladder abnormality. No biliary dilatation. Pancreas: Unremarkable Spleen: Splenomegaly again identified. Adrenals/Urinary Tract: The kidneys, adrenal glands and bladder are unremarkable. Stomach/Bowel: Stomach is within normal limits. Appendix is unremarkable. No evidence of bowel wall thickening, distention, or inflammatory changes. Vascular/Lymphatic: Aortic atherosclerosis. No enlarged abdominal or pelvic lymph nodes. Reproductive: Prostate enlargement again noted Other: A moderate to large amount of ascites is again noted and relatively unchanged. No evidence of pneumoperitoneum. Subcutaneous edema identified. Musculoskeletal: No acute or suspicious bony abnormalities. IMPRESSION: 1. Diffuse bilateral airspace opacities likely representing pulmonary edema. Infection/superimposed infection not excluded. 2. Moderate to large amount of ascites again noted with small bilateral pleural effusions and diffuse subcutaneous edema. 3. Cirrhosis and splenomegaly. 4. Cardiomegaly and small pericardial effusion. 5. Coronary artery and Aortic Atherosclerosis (ICD10-I70.0). 6. Prostatomegaly. Electronically Signed   By: Margarette Canada M.D.   On: 12/19/2017 17:44   Ct Chest Wo Contrast  Result Date: 11/20/2017 CLINICAL DATA:  Hemoptysis EXAM: CT CHEST WITHOUT CONTRAST TECHNIQUE: Multidetector CT imaging of the chest was performed  following the standard protocol without IV contrast. COMPARISON:  11/19/2017 chest x-ray.  Chest CT 11/07/2017 FINDINGS: Cardiovascular: Scattered coronary artery and aortic calcifications. Cardiomegaly. No evidence of aortic aneurysm. Small pericardial effusion. Mediastinum/Nodes: Scattered small mediastinal lymph nodes, likely reactive. No mediastinal, hilar, or axillary adenopathy. Lungs/Pleura: Diffuse bilateral airspace opacities, similar to prior CT. Trace left pleural effusion. Bronchial wall thickening noted in the lower lobes, stable. Upper Abdomen: Changes of cirrhosis. Spleen appears enlarged but is incompletely visualized. Musculoskeletal: Chest wall soft tissues are unremarkable. No acute bony abnormality. IMPRESSION: Diffuse bilateral airspace disease, most confluent in the lower lobes but also noted in the upper lobes. Findings are similar to prior CT. Bronchial wall thickening in the lower lobes. Findings could reflect edema, hemorrhage, or infection. Small left pleural effusion and pericardial effusion. Cardiomegaly. Coronary artery disease, aortic atherosclerosis. Changes of cirrhosis. Suspected splenomegaly although the spleen is not imaged in its entirety. Electronically Signed   By: Rolm Baptise M.D.   On: 11/20/2017 10:43   US Renal  Result Date: 11/22/2017 CLINICAL DATA:  Status post renal biopsy on 11/17/2017 EXAM: RENAL / URINARY TRACT ULTRASOUND COMPLETE COMPARISON:  09/04/2017 FINDINGS: Right Kidney: Length: 8.4 cm. Minimal perinephric fluid is noted along the upper pole of the right kidney this is not in the area of previous biopsy and likely represents the normal appearance of the right kidney. No hematoma from the biopsy  site is identified. Left Kidney: Length: 8.2 cm. Echogenicity within normal limits. No mass or hydronephrosis visualized. Bladder: Appears normal for degree of bladder distention. Note is made of mild ascites and mild prominence of the spleen. IMPRESSION: Mild  ascites and splenic prominence consistent with the known underlying cirrhosis. Minimal perinephric fluid is noted in the lung the upper pole of the right kidney not in the area of previous biopsy. No perinephric hematoma is noted in the lower pole. Electronically Signed   By: Inez Catalina M.D.   On: 11/22/2017 20:19   Ir Removal Tun Cv Cath W/o Fl  Result Date: 11/28/2017 INDICATION: History of Goodpasture's disease. Status post completion of plasma exchange therapy. Request removal of tunneled plasmapheresis catheter. Catheter originally placed on 11/11/2017. EXAM: REMOVAL OF TUNNELED PLASMAPHERESIS CATHETER MEDICATIONS: None COMPLICATIONS: None immediate. PROCEDURE: Informed written consent was obtained from the patient following an explanation of the procedure, risks, benefits and alternatives to treatment. A time out was performed prior to the initiation of the procedure. Maximal barrier sterile technique was utilized including caps, mask, sterile gowns, sterile gloves, large sterile drape, hand hygiene, and chlorhexidine. Utilizing a combination of blunt dissection and gentle traction, the catheter was removed intact. Hemostasis was obtained with manual compression. A dressing was placed. The patient tolerated the procedure well without immediate post procedural complication. IMPRESSION: Successful removal of tunneled plasmapheresis catheter. Read by: Ascencion Dike PA-C Electronically Signed   By: Lucrezia Europe M.D.   On: 11/28/2017 14:30   Dg Abd Portable 1v  Result Date: 11/22/2017 CLINICAL DATA:  Abdominal distension EXAM: PORTABLE ABDOMEN - 1 VIEW COMPARISON:  None. FINDINGS: Scattered large and small bowel gas is noted. Mild retained fecal material is seen. No free air is noted. No definitive signs of ascites are seen. No acute bony abnormality is noted. IMPRESSION: No acute abnormality noted. Electronically Signed   By: Inez Catalina M.D.   On: 11/22/2017 17:36   Ir Paracentesis  Result Date:  12/10/2017 INDICATION: Patient with abdominal distention, ascites. Request is made for diagnostic and therapeutic paracentesis. EXAM: ULTRASOUND GUIDED DIAGNOSTIC AND THERAPEUTIC PARACENTESIS MEDICATIONS: 10 mL 2% lidocaine COMPLICATIONS: None immediate. PROCEDURE: Informed written consent was obtained from the patient after a discussion of the risks, benefits and alternatives to treatment. A timeout was performed prior to the initiation of the procedure. Initial ultrasound scanning demonstrates a moderate amount of ascites within the right lower abdominal quadrant. The right lower abdomen was prepped and draped in the usual sterile fashion. 2% lidocaine was used for local anesthesia. Following this, a 19 gauge, 7-cm, Yueh catheter was introduced. An ultrasound image was saved for documentation purposes. The paracentesis was performed. The catheter was removed and a dressing was applied. The patient tolerated the procedure well without immediate post procedural complication. FINDINGS: A total of approximately 3.8 liters of yellow, cloudy fluid was removed. Samples were sent to the laboratory as requested by the clinical team. IMPRESSION: Successful ultrasound-guided diagnostic and therapeutic paracentesis yielding 3.8 liters of peritoneal fluid. Read by: Brynda Greathouse PA-C Electronically Signed   By: Marybelle Killings M.D.   On: 12/10/2017 11:38    Time Spent in minutes  30   Jani Gravel M.D on 12/20/2017 at 8:27 AM  Between 7am to 7pm - Pager - [phone]   After 7pm go to [email or web address] - password [facility]  [facility] -  Office  [phone]

## 2017-12-20 NOTE — ED Triage Notes (Signed)
TC to DR Maudie Mercury for up date on Bi-Pap . Pt has several PO meds and since PT is on a Bi-Pap  PO's are not given. New orders given for ABG and assessment RT removal of Bi-Pap

## 2017-12-20 NOTE — ED Notes (Signed)
Pt family request to empty and measure urinal/urine, 575cc

## 2017-12-20 NOTE — ED Triage Notes (Signed)
RT DC bi-pap and PT placed on 3 liters nasal O2.

## 2017-12-20 NOTE — ED Triage Notes (Signed)
Attempted to call report

## 2017-12-21 ENCOUNTER — Encounter (HOSPITAL_COMMUNITY): Payer: Self-pay

## 2017-12-21 DIAGNOSIS — I129 Hypertensive chronic kidney disease with stage 1 through stage 4 chronic kidney disease, or unspecified chronic kidney disease: Secondary | ICD-10-CM | POA: Diagnosis not present

## 2017-12-21 DIAGNOSIS — A419 Sepsis, unspecified organism: Secondary | ICD-10-CM | POA: Diagnosis not present

## 2017-12-21 DIAGNOSIS — J9621 Acute and chronic respiratory failure with hypoxia: Secondary | ICD-10-CM | POA: Diagnosis not present

## 2017-12-21 DIAGNOSIS — E1122 Type 2 diabetes mellitus with diabetic chronic kidney disease: Secondary | ICD-10-CM | POA: Diagnosis not present

## 2017-12-21 DIAGNOSIS — N170 Acute kidney failure with tubular necrosis: Secondary | ICD-10-CM | POA: Diagnosis not present

## 2017-12-21 DIAGNOSIS — R188 Other ascites: Secondary | ICD-10-CM | POA: Diagnosis not present

## 2017-12-21 DIAGNOSIS — Z794 Long term (current) use of insulin: Secondary | ICD-10-CM | POA: Diagnosis not present

## 2017-12-21 DIAGNOSIS — D61818 Other pancytopenia: Secondary | ICD-10-CM | POA: Diagnosis not present

## 2017-12-21 DIAGNOSIS — M310 Hypersensitivity angiitis: Secondary | ICD-10-CM | POA: Diagnosis not present

## 2017-12-21 DIAGNOSIS — J189 Pneumonia, unspecified organism: Secondary | ICD-10-CM | POA: Diagnosis not present

## 2017-12-21 DIAGNOSIS — N184 Chronic kidney disease, stage 4 (severe): Secondary | ICD-10-CM | POA: Diagnosis not present

## 2017-12-21 DIAGNOSIS — J9601 Acute respiratory failure with hypoxia: Secondary | ICD-10-CM | POA: Diagnosis not present

## 2017-12-21 DIAGNOSIS — I10 Essential (primary) hypertension: Secondary | ICD-10-CM | POA: Diagnosis not present

## 2017-12-21 LAB — CBC
HCT: 26.6 % — ABNORMAL LOW (ref 39.0–52.0)
Hemoglobin: 8.5 g/dL — ABNORMAL LOW (ref 13.0–17.0)
MCH: 25.7 pg — ABNORMAL LOW (ref 26.0–34.0)
MCHC: 32 g/dL (ref 30.0–36.0)
MCV: 80.4 fL (ref 78.0–100.0)
Platelets: 43 10*3/uL — ABNORMAL LOW (ref 150–400)
RBC: 3.31 MIL/uL — AB (ref 4.22–5.81)
RDW: 18.1 % — AB (ref 11.5–15.5)
WBC: 2.5 10*3/uL — ABNORMAL LOW (ref 4.0–10.5)

## 2017-12-21 LAB — COMPREHENSIVE METABOLIC PANEL
ALK PHOS: 60 U/L (ref 38–126)
ALT: 21 U/L (ref 17–63)
AST: 36 U/L (ref 15–41)
Albumin: 2.7 g/dL — ABNORMAL LOW (ref 3.5–5.0)
Anion gap: 19 — ABNORMAL HIGH (ref 5–15)
BUN: 83 mg/dL — ABNORMAL HIGH (ref 6–20)
CALCIUM: 6.6 mg/dL — AB (ref 8.9–10.3)
CO2: 20 mmol/L — AB (ref 22–32)
CREATININE: 4.92 mg/dL — AB (ref 0.61–1.24)
Chloride: 103 mmol/L (ref 101–111)
GFR calc non Af Amer: 11 mL/min — ABNORMAL LOW (ref >60)
GFR, EST AFRICAN AMERICAN: 13 mL/min — AB (ref >60)
Glucose, Bld: 165 mg/dL — ABNORMAL HIGH (ref 65–99)
Potassium: 5.9 mmol/L — ABNORMAL HIGH (ref 3.5–5.1)
SODIUM: 142 mmol/L (ref 135–145)
TOTAL PROTEIN: 5.2 g/dL — AB (ref 6.5–8.1)
Total Bilirubin: 2.9 mg/dL — ABNORMAL HIGH (ref 0.3–1.2)

## 2017-12-21 LAB — GLUCOSE, CAPILLARY
GLUCOSE-CAPILLARY: 374 mg/dL — AB (ref 65–99)
Glucose-Capillary: 181 mg/dL — ABNORMAL HIGH (ref 65–99)
Glucose-Capillary: 372 mg/dL — ABNORMAL HIGH (ref 65–99)
Glucose-Capillary: 312 mg/dL — ABNORMAL HIGH (ref 65–99)

## 2017-12-21 MED ORDER — LOPERAMIDE HCL 2 MG PO CAPS
2.0000 mg | ORAL_CAPSULE | ORAL | Status: DC | PRN
  Administered 2017-12-22 – 2017-12-23 (×3): 2 mg via ORAL
  Filled 2017-12-21 (×3): qty 1

## 2017-12-21 MED ORDER — METRONIDAZOLE IN NACL 5-0.79 MG/ML-% IV SOLN
500.0000 mg | Freq: Three times a day (TID) | INTRAVENOUS | Status: DC
  Administered 2017-12-21 – 2017-12-23 (×7): 500 mg via INTRAVENOUS
  Filled 2017-12-21 (×7): qty 100

## 2017-12-21 MED ORDER — SODIUM BICARBONATE 8.4 % IV SOLN
50.0000 meq | Freq: Once | INTRAVENOUS | Status: AC
  Administered 2017-12-21: 50 meq via INTRAVENOUS
  Filled 2017-12-21: qty 50

## 2017-12-21 MED ORDER — SACCHAROMYCES BOULARDII 250 MG PO CAPS
250.0000 mg | ORAL_CAPSULE | Freq: Two times a day (BID) | ORAL | Status: DC
  Administered 2017-12-21 – 2017-12-25 (×9): 250 mg via ORAL
  Filled 2017-12-21 (×11): qty 1

## 2017-12-21 MED ORDER — SODIUM CHLORIDE 0.9 % IV SOLN
1.0000 g | Freq: Once | INTRAVENOUS | Status: AC
  Administered 2017-12-21: 1 g via INTRAVENOUS
  Filled 2017-12-21: qty 10

## 2017-12-21 MED ORDER — SODIUM POLYSTYRENE SULFONATE 15 GM/60ML PO SUSP
30.0000 g | Freq: Once | ORAL | Status: AC
  Administered 2017-12-21: 30 g via ORAL
  Filled 2017-12-21: qty 120

## 2017-12-21 MED ORDER — PRO-STAT SUGAR FREE PO LIQD
30.0000 mL | Freq: Two times a day (BID) | ORAL | Status: DC
  Administered 2017-12-21 – 2017-12-25 (×9): 30 mL via ORAL
  Filled 2017-12-21 (×10): qty 30

## 2017-12-21 MED ORDER — SODIUM CHLORIDE 0.9 % IV SOLN
1.0000 g | INTRAVENOUS | Status: DC
  Administered 2017-12-21: 1 g via INTRAVENOUS
  Filled 2017-12-21 (×2): qty 1

## 2017-12-21 NOTE — Consult Note (Addendum)
Renal Service Consult Note [facility]  William [name] 12/21/2017 Sol Blazing Requesting Physician:  Dr Maudie Mercury, Lenna Sciara.   Reason for Consult:  Acute/ CRF HPI: The patient is a 67 y.o. 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  Had some dialysis but renal function improved and was dc'd off dialysis.  Cytoxan stopped due to cytopenias and was dc'd on steroids. Was dc'd on 4/16 and readmitted on 4/18 w/ worsening hemoptysis.  Had repeat PLEX w/ [facility] until hemoptysis resolved and re-pulsed with IV steroids w/ po cytoxan at reduced dose of 75 mg /d due to cytopenias. Creat peaked at 5.4 and on dc creat was 3.78 on 4/27.   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 The wbc, Hb and plts were all in acceptable range , oncology rec'd to continue Amicar but no need for continue Nplate.    Presented then on 5/17 with c/o SOB and cough and was found to be hypoxic, CT chest showed findings suggesting bilat infiltrates possilbe edema. Nephrology was called and rec'd high dose IV lasix.  He had abd distension but known hx of cirrhosis with ascites. He was started on IV abx as well for possible PNA.  In hospital he diuresed 1.1 L first day and 1.8 L yesterday.  Creat was 3.6 on admission , 3.9 yesterday and increased to 4.92 today.   Today the interpreter on the phone wouldn't work nor would the ipad interpreting system.  Didn't get any history from patient.    home meds:  -norvasc 10 qd/ coreg 12.5 bid/ lasix 40 bid/ aldactone 50 qd  -sod bicarb 1300 bid/ MVI/ po fe/ amicar 500  tid  -70/30 nph 18u bid  -prednisone 40 mg qam   date   Creat    jan 2017 1.27 Jun 2017 3.05 Aug 2017 3.54  feb 2019 3.53  April 2019 3.6- 5.7  ROS    Past Medical History  Past Medical History:  Diagnosis Date  . Anemia   . CHF (congestive heart failure) ([place])   . Cirrhosis ([place])   . Cirrhosis ([place])   . CKD (chronic kidney disease), stage IV ([place])   . Goodpasture's disease [facility])    on outpatient plasmapheresis/notes 11/20/2017  . Grade I diastolic dysfunction 55/97/4163  . History of anemia due to chronic kidney disease   . History of blood transfusion X 1   "UGIB; low blood count"  . History of plasmapheresis    "qod" (11/20/2017)  . Hypertension   . Pancytopenia ([place]) 08/20/2017  . Type II diabetes mellitus ([place])    Past Surgical History  Past Surgical History:  Procedure Laterality Date  . CATARACT EXTRACTION W/ INTRAOCULAR LENS  IMPLANT, BILATERAL Bilateral   . IR FLUORO GUIDE CV LINE RIGHT  11/10/2017  . IR PARACENTESIS  12/10/2017  . IR REMOVAL TUN CV CATH W/O FL  11/28/2017  . IR US GUIDE VASC ACCESS RIGHT  11/10/2017  . NECK SURGERY  2007   "back of neck; no hardware in there"   Family  History  Family History  Problem Relation Age of Onset  . Diabetes Mellitus II Sister   . Stroke Brother    Social History  reports that he has never smoked. He has never used smokeless tobacco. He reports that he does not drink alcohol or use drugs. Allergies  Allergies  Allergen Reactions  . Heparin     Patient is Muslim and is not permitted Pork derative due to religious belief)   Home medications Prior to Admission medications   Medication Sig Start Date End Date Taking? Authorizing Provider  acetaminophen (TYLENOL) 650 MG CR tablet Take 650 mg by mouth every 8 (eight) hours as needed for pain.   Yes [provider]  aminocaproic acid (AMICAR) 500 MG tablet Take 1 tablet (500 mg total) by mouth every 8 (eight) hours. 12/15/17 01/14/18 Yes Curt Bears, MD   amLODipine (NORVASC) 10 MG tablet Take 1 tablet (10 mg total) daily by mouth. 06/20/17 11/06/24 Yes Gildardo Pounds, NP  carvedilol (COREG) 12.5 MG tablet Take 12.5 mg by mouth 2 (two) times daily with a meal.   Yes [provider]  ferrous gluconate (IRON 27) 240 (27 FE) MG tablet Take 240 mg by mouth daily.    Yes [provider]  furosemide (LASIX) 40 MG tablet Take 1 tablet (40 mg total) by mouth 2 (two) times daily. 11/18/17 11/18/18 Yes Regalado, Belkys A, MD  insulin NPH-regular Human (NOVOLIN 70/30) (70-30) 100 UNIT/ML injection Inject 18 Units into the skin 2 (two) times daily with a meal. 11/18/17  Yes Regalado, Belkys A, MD  Multiple Vitamins-Minerals (MULTIVITAMIN WITH MINERALS) tablet Take 1 tablet by mouth daily.   Yes [provider]  predniSONE (DELTASONE) 20 MG tablet Take 2 tablets (40 mg total) by mouth daily with breakfast. Patient taking differently: Take 40 mg by mouth daily with lunch.  11/30/17  Yes Dessa Phi, DO  sodium bicarbonate 650 MG tablet Take 2 tablets (1,300 mg total) by mouth 2 (two) times daily. 11/18/17  Yes Regalado, Belkys A, MD  spironolactone (ALDACTONE) 50 MG tablet Take 50 mg by mouth daily. 12/09/17  Yes [provider]  dextromethorphan-guaiFENesin (MUCINEX DM) 30-600 MG 12hr tablet Take 1 tablet by mouth 2 (two) times daily as needed for cough. Patient not taking: Reported on 12/15/2017 11/18/17   Elmarie Shiley, MD   Liver Function Tests Recent Labs  Lab 12/15/17 1508 12/19/17 1414 12/21/17 0330  AST 27 35 36  ALT 26 27 21   ALKPHOS 102 75 60  BILITOT 0.6 1.9* 2.9*  PROT 5.8* 6.1* 5.2*  ALBUMIN 3.4* 3.4* 2.7*   Recent Labs  Lab 12/19/17 1414  LIPASE 25   CBC Recent Labs  Lab 12/15/17 1508  12/19/17 1414  12/19/17 2230 12/20/17 0458 12/21/17 0556  WBC 3.3*   < > 7.3  --  4.9 5.2 2.5*  NEUTROABS 2.2  --  5.0  --   --   --   --   HGB 10.0*  --  10.9*   < > 8.9* 9.8* 8.5*  HCT 30.7*  --  33.8*   <  > 28.0* 31.1* 26.6*  MCV 79.9  --  78.4  --  80.2 79.7 80.4  PLT 78*   < > 89*  --  59* 81* 43*   < > = values in this interval not displayed.   Basic Metabolic Panel Recent Labs  Lab 12/15/17 1508 12/19/17 1414 12/19/17 1421 12/19/17 1938 12/20/17 0458 12/21/17 0330  NA  142 141 140 140 141 142  K 4.1 5.4* 5.2* 4.9 5.3* 5.9*  CL 106 104 103 105 104 103  CO2 27 25  --   --  22 20*  GLUCOSE 109 64* 60* 110* 128* 165*  BUN 83* 81* 64* 63* 77* 83*  CREATININE 3.68* 4.16* 3.60* 3.40* 3.99* 4.92*  CALCIUM 6.8* 7.0*  --   --  6.5* 6.6*   Iron/TIBC/Ferritin/ %Sat    Component Value Date/Time   IRON 60 10/24/2017 0956   TIBC 280 10/24/2017 0956   FERRITIN 377 (H) 10/24/2017 0956   IRONPCTSAT 21 10/24/2017 0956    Vitals:   12/21/17 0431 12/21/17 0810 12/21/17 1204 12/21/17 1634  BP: 128/66 134/71 139/67 140/70  Pulse: 80 82 89 87  Resp: 16 16 18 18   Temp: 98 F (36.7 C) 98.7 F (37.1 C) 98.2 F (36.8 C) 98 F (36.7 C)  TempSrc: Oral  Oral   SpO2: 100% 99% 100% 96%  Weight: 68.6 kg (151 lb 3.8 oz)     Height:       Exam Gen alert, no distress, calm and pleasant not coughing no ^wob No rash, cyanosis or gangrene Sclera anicteric, throat clear  No jvd or bruits flat neck veins Chest clear bilat to bases RRR no MRG Abd soft ntnd moderate ascites nontender  GU normal male MS no joint effusions or deformity Ext no LE or UE edema, no wounds or ulcers Neuro is alert, Ox 3 , nf   home meds:  -norvasc 10 qd/ coreg 12.5 bid/ lasix 40 bid/ aldactone 50 qd  -sod bicarb 1300 bid/ MVI/ po fe/ amicar 500 tid  -70/30 nph 18u bid  -prednisone 40 mg qam   date   Creat    jan 2017 1.27 Jun 2017 3.05 Aug 2017 3.54  feb 2019 3.53  April 2019 3.6- 5.7   UA 4/18 >  protein > 300, 0-5 rbc/ wbc, rare bact UA 5/17 >  protein 100, negative otherwise  Renal US 11/22/17 > R kidney 8.4 cm/ L kidney 8.2 cm normal echo/ no hydro, ^spleen and mild ascites c/w known cirrhosis  CT  abd/ pelvis 12/19/17 > 1. Diffuse bilateral airspace opacities likely representing pulmonary edema. Infection/superimposed infection not excluded. 2. Moderate to large amount of ascites again noted with small bilateral pleural effusions and diffuse subcutaneous edema. 3. Cirrhosis and splenomegaly. 4. Cardiomegaly and small pericardial effusion. 5. Coronary artery and Aortic Atherosclerosis (ICD10-I70.0). 6. Prostatomegaly  Renal biopsy:  DIABETIC GLOMERULOPATHY IN ASSOCIATION WITH MODERATE TO SEVERE ARTERIONEPHROSCLEROSIS AND SECONDARY FOCAL AND SEGMENTAL GLOMERUSCLEROSIS. ACUTE TUBULAR INJURY. NO EVIDENCE OF AN ACTIVE GLOMERULONEPHRITIS.  Impression: 1  CKD stage IV - no biopsy evidence that patient actually has renal GN.  The biopsy was negative for active GN even though there was reportedly not enough tissue to do IF.  The biopsy did show diabetic renal glomerulopathy w/ secondary FSGS, severe HTN'sive nephrosclerosis and ATN. The UA's recently have not shown any microhematuria. The kidneys are on the smaller side suggesting chronic disease.   Will d/w colleagues but for now not certain that this has been a definite acute renal GN.  Pulm infiltrates are concerning and are extensive and have basically never really subsided upon reviewing the last 6 months of CXR's and CT's.  Clinically the patient is not significantly vol overloaded to account for the imaging changes all being due to vol overload.  He does have ascites which is old and has known hx  of cirrhosis.  Creat bumped ^ with diuresis x 2 days, another point arguing against simple vol overload.  Need pulm input and may need bronch.  The only thing we are going on for goodpasture's is a low titer anti-GBM x 1 I believe.  Again the renal biopsy is not suggestive of any type of GN, rather indicative of ATN superimposed on significant diabetic and hypertensive renal injuries which are chronic. It is just as likely in my opinion that patient has  diabetic/ htn'sive renal failure stage IV picked up late last year and that the lung problems are unrelated.  For now recommend holding lasix and consulting pulmonary tomorrow for persistent lung symptoms and imaging changes. Another consideration could be R heart cath for volume measurements but contrast would prob be prohibitive.   2  Cirrhosis w ascites - known 3  DM on nph 70/30 4  HTN on ccb/ coreg and diuretics at home 5  Volume - no edema or JVD, lungs relatively clear on exam, has moderate ascites 6  Fevers - w/ diarrhea/ lung infiltrates   Plan - as above  Kelly Splinter MD [employer] pager [phone]   12/21/2017, 7:05 PM

## 2017-12-21 NOTE — Progress Notes (Signed)
Patient ID: William [name], male   DOB: April 08, 1951, 67 y.o.   MRN: [id number]                                                                PROGRESS NOTE                                                                                                                                                                                                             Patient Demographics:    William [name], is a 67 y.o. male, DOB - 01-24-1951, HDQ:[id number]  Admit date - 12/19/2017   Admitting Physician Gwynne Edinger, MD  Outpatient Primary MD for the patient is Gildardo Pounds, NP  LOS - 2  Outpatient Specialists:    Dr. Julien Nordmann oncology Dr. Posey Pronto Nephrology Dr. Oletta Lamas GI    Chief Complaint  Patient presents with  . Shortness of Breath       Brief Narrative  67 y.o.malewith medical history significant forrecent diagnosis goodpasture syndrome, pancytopenia, cirrhosis with ascites, dCHF, CKD4 not on dialysis currently, and diabetes, with recent hospitalizations, who presents brought in by son for the above.  2 admissions Last month. Initially presented with AKI on ckd with volume overload and hyoxic respiratory failure. Readmitted for hemoptysis. Has been diagnosed with goodpasture's disease. Was treated with cytoxan and outpatient plasmapheresis. Inpatient treated with corticosteroids, plasmapheresis, and dialysis. Also received multiple blood transfusions. Cytopenias thought to be 2/2 goodpasture's. Cytoxan stopped and discharged on steroids. Dialysis stopped. Had u/s guided paracentesis as outpt on 5/8 with 3.8 L removed. Followed up with Dr. Earlie Server oncology; also followed by dr. Posey Pronto with nephrology and dr. Oletta Lamas for cirrhosis.  Patient brought to ED by his son. HIstory obtained by ED physician mainly from son. Son not available during my eval and with translator phone patient drowsy but arousable, did not answer my questions. Per ED report patient presents with one day nonproductive  cough and shortness of breath as well as fevers. No nausea or vomiting. Complains of abdominal distention but no pain. Does report diarrhea since hospitalization last month. Report not clear as to whether bloody   ED Course:fluid resuscitation, lasix 120, nephro consult, labs, zosyn/vancomycin     Subjective:    William [name] today has slight diarrhea,  2-3 loose stool per day.  Breathing is better. Denies fever, chills, cp, palp, cough.    No headache,  No abdominal pain - No Nausea, No new weakness tingling or numbness,   Assessment  & Plan :    Active Problems:   Type II diabetes mellitus with renal manifestations (HCC)   Acute respiratory failure with hypoxia (HCC)   Pancytopenia (HCC)   Essential hypertension   Goodpasture's syndrome (HCC) with associated hemoptysis   Cirrhosis of liver with ascites (HCC)   CKD (chronic kidney disease) stage 4, GFR 15-29 ml/min (HCC)   Hyperkalemia   HCAP (healthcare-associated pneumonia)   Ascites    Acute healthcare-associated pneumonia - Per admitting physician-  -unfortunately unable to obtain accurate history as patient not cooperative w/ questioning and son not available. Symptoms do appear to be consistent with pneumonia as does exam, fever, and is with acute hypoxic respiratory failure. CT also shows pleural effusions and has known ascites 2/2 cirrhosis. Other causes of fever could be c diff as recent hospitalizations and report of diarrhea. Urinalysis and blood cultures are also pending. Abdomen non-tender, but SBP would be worth pursuing if does not respond to current therapy. Vanco iv, zosyn iv given in ED. Respiratory panel negative MRSA screen negative Blood culture x2 pending DC vanco due to renal insufficiency and also MRSA screen negative Cont Cefepime iv pharmacy to dose  Acute hypoxic respiratory failure - O2 88-90% on 5 L [place]. No increased work of breathing. Treating infection as above. Given cirrhosis and advanced  ckd fluid overload could play a role. Nephrology advising aggressive diuresis with lasix 120 mg IV OFF Bipap 5/18 HOLD Lasix today due to worsening renal function and monitor  Diarrhea +Giardia Start Flagyl 559m iv tid (5/19)  Goodpasture syndrome CKD 4  Hyperkalemia Calcium gluconateNephrology consulted by admitting physician, will request further input  Anemia/Thrombocytopenia  thought to be reactive 2/2 goodpasture.   Continue home prednisone and amica Appreciate nephrology input Check cbc in am  Cirrhosis - followed as outpt - consider diagnostic/therapeutic paracentesis as above if any worsening abd distension,seems ok today   diastolic CHF - could play role with above processes - consider TTE if volume overload persists despite above treatments  DM - here initial glucose low at 64, improved to 110 - SSI, hold home insulin  HTN - Resume home medications Holding lasix due to worsening renal function    DVT prophylaxis:SCDs Code Status:full Family Communication:patient's son Disposition Plan:TBD Consults called:nephrology Admission status:step-down        Lab Results  Component Value Date   PLT 43 (L) 12/21/2017    Antibiotics  :  Vanco, zosyn 5/17, 5/18 Cefepime 5/19 =>  Anti-infectives (From admission, onward)   Start     Dose/Rate Route Frequency Ordered Stop   12/21/17 2200  vancomycin (VANCOCIN) IVPB 1000 mg/200 mL premix     1,000 mg 200 mL/hr over 60 Minutes Intravenous Every 48 hours 12/19/17 2211     12/19/17 2215  piperacillin-tazobactam (ZOSYN) IVPB 2.25 g     2.25 g 100 mL/hr over 30 Minutes Intravenous Every 8 hours 12/19/17 2211     12/19/17 1445  vancomycin (VANCOCIN) 1,500 mg in sodium chloride 0.9 % 500 mL IVPB     1,500 mg 250 mL/hr over 120 Minutes Intravenous  Once 12/19/17 1428 12/19/17 1843   12/19/17 1415  piperacillin-tazobactam (ZOSYN) IVPB 3.375 g     3.375 g 100 mL/hr over 30 Minutes Intravenous   Once 12/19/17 1410 12/19/17 1713   12/19/17 1415  vancomycin (VANCOCIN) IVPB 1000 mg/200 mL premix  Status:  Discontinued     1,000 mg 200 mL/hr over 60 Minutes Intravenous  Once 12/19/17 1410 12/19/17 1428        Objective:   Vitals:   12/20/17 2347 12/20/17 2347 12/21/17 0431 12/21/17 0810  BP: 135/66  128/66 134/71  Pulse: 78  80 82  Resp: 16  16 16   Temp:  98.8 F (37.1 C) 98 F (36.7 C) 98.7 F (37.1 C)  TempSrc:  Oral Oral   SpO2: 98%  100% 99%  Weight:   68.6 kg (151 lb 3.8 oz)   Height:        Wt Readings from Last 3 Encounters:  12/21/17 68.6 kg (151 lb 3.8 oz)  12/15/17 71.1 kg (156 lb 11.2 oz)  12/09/17 71.6 kg (157 lb 12.8 oz)     Intake/Output Summary (Last 24 hours) at 12/21/2017 0857 Last data filed at 12/21/2017 1025 Gross per 24 hour  Intake 150 ml  Output 1800 ml  Net -1650 ml     Physical Exam  Awake Alert, Oriented X 3, No new F.N deficits, Normal affect [place].AT,PERRAL Supple Neck,No JVD, No cervical lymphadenopathy appriciated.  Symmetrical Chest wall movement, Good air movement bilaterally, slight crackle bilateral base, no wheezing RRR,No Gallops,Rubs or new Murmurs, No Parasternal Heave +ve B.Sounds, Abd Soft, No tenderness, No organomegaly appriciated, No rebound - guarding or rigidity. No Cyanosis, Clubbing or edema, No new Rash or bruise      Data Review:    CBC Recent Labs  Lab 12/15/17 1508  12/19/17 1414 12/19/17 1421 12/19/17 1938 12/19/17 2230 12/20/17 0458 12/21/17 0556  WBC 3.3*  --  7.3  --   --  4.9 5.2 2.5*  HGB 10.0*   < > 10.9* 11.2* 8.8* 8.9* 9.8* 8.5*  HCT 30.7*  --  33.8* 33.0* 26.0* 28.0* 31.1* 26.6*  PLT 78*  --  89*  --   --  59* 81* 43*  MCV 79.9  --  78.4  --   --  80.2 79.7 80.4  MCH 26.0*  --  25.3*  --   --  25.5* 25.1* 25.7*  MCHC 32.6  --  32.2  --   --  31.8 31.5 32.0  RDW 18.2*  --  18.1*  --   --  18.4* 18.6* 18.1*  LYMPHSABS 0.5*  --  1.0  --   --   --   --   --   MONOABS 0.5  --  1.0  --   --    --   --   --   EOSABS 0.0  --  0.1  --   --   --   --   --   BASOSABS 0.0  --  0.0  --   --   --   --   --    < > = values in this interval not displayed.    Chemistries  Recent Labs  Lab 12/15/17 1508 12/19/17 1414 12/19/17 1421 12/19/17 1938 12/20/17 0458 12/21/17 0330  NA 142 141 140 140 141 142  K 4.1 5.4* 5.2* 4.9 5.3* 5.9*  CL 106 104 103 105 104 103  CO2 27 25  --   --  22 20*  GLUCOSE 109 64* 60* 110* 128* 165*  BUN 83* 81* 64* 63* 77* 83*  CREATININE 3.68* 4.16* 3.60* 3.40* 3.99* 4.92*  CALCIUM 6.8* 7.0*  --   --  6.5* 6.6*  AST 27  35  --   --   --  36  ALT 26 27  --   --   --  21  ALKPHOS 102 75  --   --   --  60  BILITOT 0.6 1.9*  --   --   --  2.9*   ------------------------------------------------------------------------------------------------------------------ No results for input(s): CHOL, HDL, LDLCALC, TRIG, CHOLHDL, LDLDIRECT in the last 72 hours.  Lab Results  Component Value Date   HGBA1C 8.1 06/20/2017   ------------------------------------------------------------------------------------------------------------------ No results for input(s): TSH, T4TOTAL, T3FREE, THYROIDAB in the last 72 hours.  Invalid input(s): FREET3 ------------------------------------------------------------------------------------------------------------------ No results for input(s): VITAMINB12, FOLATE, FERRITIN, TIBC, IRON, RETICCTPCT in the last 72 hours.  Coagulation profile No results for input(s): INR, PROTIME in the last 168 hours.  No results for input(s): DDIMER in the last 72 hours.  Cardiac Enzymes No results for input(s): CKMB, TROPONINI, MYOGLOBIN in the last 168 hours.  Invalid input(s): CK ------------------------------------------------------------------------------------------------------------------    Component Value Date/Time   BNP 716.4 (H) 12/19/2017 1414    Inpatient Medications  Scheduled Meds: . aminocaproic acid  500 mg Oral Q8H  .  amLODipine  10 mg Oral Daily  . carvedilol  12.5 mg Oral BID WC  . feeding supplement (PRO-STAT SUGAR FREE 64)  30 mL Oral BID  . ferrous gluconate  324 mg Oral Q breakfast  . insulin aspart  0-15 Units Subcutaneous TID WC  . predniSONE  40 mg Oral Q breakfast  . sodium bicarbonate  1,300 mg Oral BID  . sodium polystyrene  30 g Oral Once   Continuous Infusions: . calcium gluconate    . piperacillin-tazobactam (ZOSYN)  IV Stopped (12/21/17 2197)  . vancomycin     PRN Meds:.acetaminophen  Micro Results Recent Results (from the past 240 hour(s))  Blood Culture (routine x 2)     Status: None (Preliminary result)   Collection Time: 12/19/17  2:10 PM  Result Value Ref Range Status   Specimen Description BLOOD RIGHT ANTECUBITAL  Final   Special Requests AEROBIC BOTTLE ONLY Blood Culture adequate volume  Final   Culture   Final    NO GROWTH < 24 HOURS Performed at [facility], [address], [place], [place] [address]    Report Status PENDING  Incomplete  Blood Culture (routine x 2)     Status: None (Preliminary result)   Collection Time: 12/19/17  4:32 PM  Result Value Ref Range Status   Specimen Description BLOOD BLOOD RIGHT HAND  Final   Special Requests   Final    BOTTLES DRAWN AEROBIC AND ANAEROBIC Blood Culture adequate volume   Culture   Final    NO GROWTH < 24 HOURS Performed at [facility], [place] [address] [place], [place] [address]    Report Status PENDING  Incomplete  Gastrointestinal Panel by PCR , Stool     Status: Abnormal   Collection Time: 12/19/17  6:47 PM  Result Value Ref Range Status   Campylobacter species NOT DETECTED NOT DETECTED Final   Plesimonas shigelloides NOT DETECTED NOT DETECTED Final   Salmonella species NOT DETECTED NOT DETECTED Final   Yersinia enterocolitica NOT DETECTED NOT DETECTED Final   Vibrio species NOT DETECTED NOT DETECTED Final   Vibrio cholerae NOT DETECTED NOT DETECTED Final   Enteroaggregative E coli (EAEC) NOT  DETECTED NOT DETECTED Final   Enteropathogenic E coli (EPEC) NOT DETECTED NOT DETECTED Final   Enterotoxigenic E coli (ETEC) NOT DETECTED NOT DETECTED Final  Shiga like toxin producing E coli (STEC) NOT DETECTED NOT DETECTED Final   E. coli O157 NOT DETECTED NOT DETECTED Final   Shigella/Enteroinvasive E coli (EIEC) NOT DETECTED NOT DETECTED Final   Cryptosporidium NOT DETECTED NOT DETECTED Final   Cyclospora cayetanensis NOT DETECTED NOT DETECTED Final   Entamoeba histolytica NOT DETECTED NOT DETECTED Final   Giardia lamblia DETECTED (A) NOT DETECTED Final   Adenovirus F40/41 NOT DETECTED NOT DETECTED Final   Astrovirus NOT DETECTED NOT DETECTED Final   Norovirus GI/GII NOT DETECTED NOT DETECTED Final   Rotavirus A NOT DETECTED NOT DETECTED Final   Sapovirus (I, II, IV, and V) NOT DETECTED NOT DETECTED Final    Comment: Performed at [facility], [address] [place], [place] [address]  C difficile quick scan w PCR reflex     Status: None   Collection Time: 12/19/17  6:47 PM  Result Value Ref Range Status   C Diff antigen NEGATIVE NEGATIVE Final   C Diff toxin NEGATIVE NEGATIVE Final   C Diff interpretation No C. difficile detected.  Final    Comment: Performed at [facility], [place] [address], [place], [place] [address]  Respiratory Panel by PCR     Status: None   Collection Time: 12/19/17 11:16 PM  Result Value Ref Range Status   Adenovirus NOT DETECTED NOT DETECTED Final   Coronavirus 229E NOT DETECTED NOT DETECTED Final   Coronavirus HKU1 NOT DETECTED NOT DETECTED Final   Coronavirus NL63 NOT DETECTED NOT DETECTED Final   Coronavirus OC43 NOT DETECTED NOT DETECTED Final   Metapneumovirus NOT DETECTED NOT DETECTED Final   Rhinovirus / Enterovirus NOT DETECTED NOT DETECTED Final   Influenza A NOT DETECTED NOT DETECTED Final   Influenza B NOT DETECTED NOT DETECTED Final   Parainfluenza Virus 1 NOT DETECTED NOT DETECTED Final   Parainfluenza Virus 2 NOT  DETECTED NOT DETECTED Final   Parainfluenza Virus 3 NOT DETECTED NOT DETECTED Final   Parainfluenza Virus 4 NOT DETECTED NOT DETECTED Final   Respiratory Syncytial Virus NOT DETECTED NOT DETECTED Final   Bordetella pertussis NOT DETECTED NOT DETECTED Final   Chlamydophila pneumoniae NOT DETECTED NOT DETECTED Final   Mycoplasma pneumoniae NOT DETECTED NOT DETECTED Final  MRSA PCR Screening     Status: None   Collection Time: 12/20/17  7:51 PM  Result Value Ref Range Status   MRSA by PCR NEGATIVE NEGATIVE Final    Comment:        The GeneXpert MRSA Assay (FDA approved for NASAL specimens only), is one component of a comprehensive MRSA colonization surveillance program. It is not intended to diagnose MRSA infection nor to guide or monitor treatment for MRSA infections. Performed at [facility], [place] [address] [place],  [address]     Radiology Reports Ct Abdomen Pelvis Wo Contrast  Result Date: 12/19/2017 CLINICAL DATA:  67 year old male with shortness of breath, fever and abdominal distension. History of cirrhosis. EXAM: CT CHEST, ABDOMEN AND PELVIS WITHOUT CONTRAST TECHNIQUE: Multidetector CT imaging of the chest, abdomen and pelvis was performed following the standard protocol without IV contrast. COMPARISON:  12/06/2017 abdominal/pelvic CT and 11/20/2017 chest CT FINDINGS: Please note that parenchymal abnormalities may be missed without intravenous contrast. CT CHEST FINDINGS Cardiovascular: Cardiomegaly and coronary/thoracic aortic atherosclerotic calcifications again noted. A small pericardial effusion is unchanged. There is no evidence of thoracic aortic aneurysm. Mediastinum/Nodes: No enlarged mediastinal, hilar, or axillary lymph nodes. Thyroid gland, trachea, and esophagus demonstrate no  significant findings. Lungs/Pleura: Diffuse bilateral airspace opacities, primarily central, are noted likely representing edema but infection/superimposed infection is not excluded.  Small bilateral pleural effusions are noted. Bibasilar atelectasis identified. There is no evidence of pneumothorax. Musculoskeletal: No acute abnormality CT ABDOMEN PELVIS FINDINGS Hepatobiliary: Cirrhosis changes identified. No gross focal hepatic abnormality noted. No definite gallbladder abnormality. No biliary dilatation. Pancreas: Unremarkable Spleen: Splenomegaly again identified. Adrenals/Urinary Tract: The kidneys, adrenal glands and bladder are unremarkable. Stomach/Bowel: Stomach is within normal limits. Appendix is unremarkable. No evidence of bowel wall thickening, distention, or inflammatory changes. Vascular/Lymphatic: Aortic atherosclerosis. No enlarged abdominal or pelvic lymph nodes. Reproductive: Prostate enlargement again noted Other: A moderate to large amount of ascites is again noted and relatively unchanged. No evidence of pneumoperitoneum. Subcutaneous edema identified. Musculoskeletal: No acute or suspicious bony abnormalities. IMPRESSION: 1. Diffuse bilateral airspace opacities likely representing pulmonary edema. Infection/superimposed infection not excluded. 2. Moderate to large amount of ascites again noted with small bilateral pleural effusions and diffuse subcutaneous edema. 3. Cirrhosis and splenomegaly. 4. Cardiomegaly and small pericardial effusion. 5. Coronary artery and Aortic Atherosclerosis (ICD10-I70.0). 6. Prostatomegaly. Electronically Signed   By: Margarette Canada M.D.   On: 12/19/2017 17:44   Ct Abdomen Pelvis Wo Contrast  Result Date: 12/06/2017 CLINICAL DATA:  Abdominal distention and diarrhea. EXAM: CT ABDOMEN AND PELVIS WITHOUT CONTRAST TECHNIQUE: Multidetector CT imaging of the abdomen and pelvis was performed following the standard protocol without IV contrast. COMPARISON:  Abdominal ultrasound dated September 04, 2017. FINDINGS: Lower chest: Bronchiectasis and scarring at both lung bases. Mild interlobular septal thickening. Hepatobiliary: Small, nodular liver with  prominent periportal edema. No focal liver abnormality. The gallbladder is unremarkable. Pancreas: Atrophic. No ductal dilatation or surrounding inflammatory changes. Spleen: Moderately enlarged.  No focal abnormality. Adrenals/Urinary Tract: Adrenal glands are unremarkable. Moderate bilateral renal atrophy. No renal or ureteral calculi. No hydronephrosis. Stomach/Bowel: Stomach is within normal limits. Appendix appears normal. No evidence of bowel wall thickening, distention, or inflammatory changes. Vascular/Lymphatic: Splenic and possibly gastric varices. No enlarged abdominal or pelvic lymph nodes. Reproductive: Mild prostatomegaly. Other: Moderate ascites.  No pneumoperitoneum. Musculoskeletal: No acute or significant osseous findings. IMPRESSION: 1. Cirrhosis with sequelae of portal hypertension including moderate splenomegaly and ascites. Splenic and possibly gastric varices. 2. Moderate bilateral renal atrophy. 3. Bronchiectasis and scarring at both lung bases, likely postinflammatory/postinfectious. 4. Mild pulmonary interstitial edema at the lung bases. Electronically Signed   By: Titus Dubin M.D.   On: 12/06/2017 18:16   Dg Chest 2 View  Result Date: 12/19/2017 CLINICAL DATA:  SOB/hypoxia since this morning - hx of CHF, htn, diabetes, cirrhosis, kidney disease, fluid removal EXAM: CHEST - 2 VIEW COMPARISON:  11/19/2017 FINDINGS: The heart is mildly enlarged. There are prominent opacities throughout the lungs, similar in appearance to the previous exams. No new consolidations or pleural effusions. RIGHT central line has been removed. IMPRESSION: Persistent bilateral lung opacities. Electronically Signed   By: Nolon Nations M.D.   On: 12/19/2017 16:10   Dg Abdomen 1 View  Result Date: 12/03/2017 CLINICAL DATA:  Hematemesis. Constipation, abdominal pain and diarrhea. EXAM: ABDOMEN - 1 VIEW COMPARISON:  Abdominal radiograph November 26, 2017 FINDINGS: Paucity of large bowel gas. Multiple loops of  nondistended gas-filled small bowel somewhat centrally displaced seen with ascites. Hazy appearance the abdomen. No radio-opaque calculi or other significant radiographic abnormality are seen. IMPRESSION: Nonspecific bowel gas pattern. Hazy appearance of abdomen seen with ascites. Electronically Signed   By: Elon Alas M.D.   On: 12/03/2017  16:15   Dg Abd 1 View  Result Date: 11/26/2017 CLINICAL DATA:  Abdominal distension EXAM: ABDOMEN - 1 VIEW COMPARISON:  None. FINDINGS: There is moderate stool in the colon. There is no appreciable bowel dilatation or air-fluid level to suggest bowel obstruction. No evident free air. There is a benign exostosis arising from the midportion of the right iliac crest laterally. IMPRESSION: No bowel obstruction or free air evident.  Moderate stool in colon. Electronically Signed   By: Lowella Grip III M.D.   On: 11/26/2017 13:22   Ct Chest Wo Contrast  Result Date: 12/19/2017 CLINICAL DATA:  67 year old male with shortness of breath, fever and abdominal distension. History of cirrhosis. EXAM: CT CHEST, ABDOMEN AND PELVIS WITHOUT CONTRAST TECHNIQUE: Multidetector CT imaging of the chest, abdomen and pelvis was performed following the standard protocol without IV contrast. COMPARISON:  12/06/2017 abdominal/pelvic CT and 11/20/2017 chest CT FINDINGS: Please note that parenchymal abnormalities may be missed without intravenous contrast. CT CHEST FINDINGS Cardiovascular: Cardiomegaly and coronary/thoracic aortic atherosclerotic calcifications again noted. A small pericardial effusion is unchanged. There is no evidence of thoracic aortic aneurysm. Mediastinum/Nodes: No enlarged mediastinal, hilar, or axillary lymph nodes. Thyroid gland, trachea, and esophagus demonstrate no significant findings. Lungs/Pleura: Diffuse bilateral airspace opacities, primarily central, are noted likely representing edema but infection/superimposed infection is not excluded. Small bilateral  pleural effusions are noted. Bibasilar atelectasis identified. There is no evidence of pneumothorax. Musculoskeletal: No acute abnormality CT ABDOMEN PELVIS FINDINGS Hepatobiliary: Cirrhosis changes identified. No gross focal hepatic abnormality noted. No definite gallbladder abnormality. No biliary dilatation. Pancreas: Unremarkable Spleen: Splenomegaly again identified. Adrenals/Urinary Tract: The kidneys, adrenal glands and bladder are unremarkable. Stomach/Bowel: Stomach is within normal limits. Appendix is unremarkable. No evidence of bowel wall thickening, distention, or inflammatory changes. Vascular/Lymphatic: Aortic atherosclerosis. No enlarged abdominal or pelvic lymph nodes. Reproductive: Prostate enlargement again noted Other: A moderate to large amount of ascites is again noted and relatively unchanged. No evidence of pneumoperitoneum. Subcutaneous edema identified. Musculoskeletal: No acute or suspicious bony abnormalities. IMPRESSION: 1. Diffuse bilateral airspace opacities likely representing pulmonary edema. Infection/superimposed infection not excluded. 2. Moderate to large amount of ascites again noted with small bilateral pleural effusions and diffuse subcutaneous edema. 3. Cirrhosis and splenomegaly. 4. Cardiomegaly and small pericardial effusion. 5. Coronary artery and Aortic Atherosclerosis (ICD10-I70.0). 6. Prostatomegaly. Electronically Signed   By: Margarette Canada M.D.   On: 12/19/2017 17:44   US Renal  Result Date: 11/22/2017 CLINICAL DATA:  Status post renal biopsy on 11/17/2017 EXAM: RENAL / URINARY TRACT ULTRASOUND COMPLETE COMPARISON:  09/04/2017 FINDINGS: Right Kidney: Length: 8.4 cm. Minimal perinephric fluid is noted along the upper pole of the right kidney this is not in the area of previous biopsy and likely represents the normal appearance of the right kidney. No hematoma from the biopsy site is identified. Left Kidney: Length: 8.2 cm. Echogenicity within normal limits. No mass  or hydronephrosis visualized. Bladder: Appears normal for degree of bladder distention. Note is made of mild ascites and mild prominence of the spleen. IMPRESSION: Mild ascites and splenic prominence consistent with the known underlying cirrhosis. Minimal perinephric fluid is noted in the lung the upper pole of the right kidney not in the area of previous biopsy. No perinephric hematoma is noted in the lower pole. Electronically Signed   By: Inez Catalina M.D.   On: 11/22/2017 20:19   Ir Removal Tun Cv Cath W/o Fl  Result Date: 11/28/2017 INDICATION: History of Goodpasture's disease. Status post completion of plasma  exchange therapy. Request removal of tunneled plasmapheresis catheter. Catheter originally placed on 11/11/2017. EXAM: REMOVAL OF TUNNELED PLASMAPHERESIS CATHETER MEDICATIONS: None COMPLICATIONS: None immediate. PROCEDURE: Informed written consent was obtained from the patient following an explanation of the procedure, risks, benefits and alternatives to treatment. A time out was performed prior to the initiation of the procedure. Maximal barrier sterile technique was utilized including caps, mask, sterile gowns, sterile gloves, large sterile drape, hand hygiene, and chlorhexidine. Utilizing a combination of blunt dissection and gentle traction, the catheter was removed intact. Hemostasis was obtained with manual compression. A dressing was placed. The patient tolerated the procedure well without immediate post procedural complication. IMPRESSION: Successful removal of tunneled plasmapheresis catheter. Read by: Ascencion Dike PA-C Electronically Signed   By: Lucrezia Europe M.D.   On: 11/28/2017 14:30   Dg Abd Portable 1v  Result Date: 11/22/2017 CLINICAL DATA:  Abdominal distension EXAM: PORTABLE ABDOMEN - 1 VIEW COMPARISON:  None. FINDINGS: Scattered large and small bowel gas is noted. Mild retained fecal material is seen. No free air is noted. No definitive signs of ascites are seen. No acute bony  abnormality is noted. IMPRESSION: No acute abnormality noted. Electronically Signed   By: Inez Catalina M.D.   On: 11/22/2017 17:36   Ir Paracentesis  Result Date: 12/10/2017 INDICATION: Patient with abdominal distention, ascites. Request is made for diagnostic and therapeutic paracentesis. EXAM: ULTRASOUND GUIDED DIAGNOSTIC AND THERAPEUTIC PARACENTESIS MEDICATIONS: 10 mL 2% lidocaine COMPLICATIONS: None immediate. PROCEDURE: Informed written consent was obtained from the patient after a discussion of the risks, benefits and alternatives to treatment. A timeout was performed prior to the initiation of the procedure. Initial ultrasound scanning demonstrates a moderate amount of ascites within the right lower abdominal quadrant. The right lower abdomen was prepped and draped in the usual sterile fashion. 2% lidocaine was used for local anesthesia. Following this, a 19 gauge, 7-cm, Yueh catheter was introduced. An ultrasound image was saved for documentation purposes. The paracentesis was performed. The catheter was removed and a dressing was applied. The patient tolerated the procedure well without immediate post procedural complication. FINDINGS: A total of approximately 3.8 liters of yellow, cloudy fluid was removed. Samples were sent to the laboratory as requested by the clinical team. IMPRESSION: Successful ultrasound-guided diagnostic and therapeutic paracentesis yielding 3.8 liters of peritoneal fluid. Read by: Brynda Greathouse PA-C Electronically Signed   By: Marybelle Killings M.D.   On: 12/10/2017 11:38    Time Spent in minutes  30   Jani Gravel M.D on 12/21/2017 at 8:57 AM  Between 7am to 7pm - Pager - [phone]  After 7pm go to [email or web address] - password [facility]  [facility] -  Office  [phone]

## 2017-12-21 NOTE — Progress Notes (Signed)
Patient is not wearing bipap and has not wore it since leaving the emergency room.

## 2017-12-21 NOTE — Progress Notes (Signed)
Pharmacy Antibiotic Note  William [name] is a 67 y.o. male admitted on 12/19/2017 with shortness of breath.  Pharmacy has been consulted for cefepime for HCAP.   Tmax was 102.2 yesterday but has been afebrile since. SCr is up to 4.92 today. Switching zosyn to cefepime in setting of AKI. Discussed w/ MD, will also hold off on further vancomycin at this time given AKI and negative MRSA PCR.   Of note, GI panel detected giardia - plan is to follow up and see if patient is having diarrhea/symptoms.  Plan: Cefepime 1gm IV every 24 hours Stop zosyn Trial off of vancomycin for now  Follow renal function, clinical improvement   Temp (24hrs), Avg:99.3 F (37.4 C), Min:98 F (36.7 C), Max:102.2 F (39 C)  Recent Labs  Lab 12/15/17 1508 12/19/17 1414 12/19/17 1421 12/19/17 1938 12/19/17 2230 12/20/17 0458 12/21/17 0330 12/21/17 0556  WBC 3.3* 7.3  --   --  4.9 5.2  --  2.5*  CREATININE 3.68* 4.16* 3.60* 3.40*  --  3.99* 4.92*  --   LATICACIDVEN  --   --  1.07  --   --   --   --   --     Estimated Creatinine Clearance: 12.8 mL/min (A) (by C-G formula based on SCr of 4.92 mg/dL (H)).    Allergies  Allergen Reactions  . Heparin     Patient is Muslim and is not permitted Pork derative due to religious belief)    Antimicrobials this admission: Vanc 5/17 >> 5/19  Zosyn 5/17 >>5/19 Cefepime 5/19 >>  Dose adjustments this admission:   Microbiology results: 5/17 BCx - NG < 24H 5/17 C.diff - negative 5/17 GI panel PCR - giardia lamblia  5/17 resp panel PCR - negative 5/18 MRSA PCR - negative   Thank you for allowing pharmacy to be a part of this patient's care.  Demetrius Charity, PharmD PGY2 Oncology Pharmacy Resident  Pharmacy Phone: [phone] 12/21/2017

## 2017-12-22 ENCOUNTER — Encounter (HOSPITAL_COMMUNITY): Payer: Medicaid Other

## 2017-12-22 ENCOUNTER — Inpatient Hospital Stay (HOSPITAL_COMMUNITY): Payer: Medicare Other

## 2017-12-22 DIAGNOSIS — N185 Chronic kidney disease, stage 5: Secondary | ICD-10-CM | POA: Diagnosis not present

## 2017-12-22 DIAGNOSIS — J9621 Acute and chronic respiratory failure with hypoxia: Secondary | ICD-10-CM | POA: Diagnosis not present

## 2017-12-22 DIAGNOSIS — A419 Sepsis, unspecified organism: Secondary | ICD-10-CM | POA: Diagnosis not present

## 2017-12-22 DIAGNOSIS — Z794 Long term (current) use of insulin: Secondary | ICD-10-CM | POA: Diagnosis not present

## 2017-12-22 DIAGNOSIS — R188 Other ascites: Secondary | ICD-10-CM | POA: Diagnosis not present

## 2017-12-22 DIAGNOSIS — D61818 Other pancytopenia: Secondary | ICD-10-CM | POA: Diagnosis not present

## 2017-12-22 DIAGNOSIS — N184 Chronic kidney disease, stage 4 (severe): Secondary | ICD-10-CM | POA: Diagnosis not present

## 2017-12-22 DIAGNOSIS — E1122 Type 2 diabetes mellitus with diabetic chronic kidney disease: Secondary | ICD-10-CM | POA: Diagnosis not present

## 2017-12-22 DIAGNOSIS — I129 Hypertensive chronic kidney disease with stage 1 through stage 4 chronic kidney disease, or unspecified chronic kidney disease: Secondary | ICD-10-CM | POA: Diagnosis not present

## 2017-12-22 DIAGNOSIS — M310 Hypersensitivity angiitis: Secondary | ICD-10-CM | POA: Diagnosis not present

## 2017-12-22 DIAGNOSIS — J189 Pneumonia, unspecified organism: Secondary | ICD-10-CM | POA: Diagnosis not present

## 2017-12-22 DIAGNOSIS — N170 Acute kidney failure with tubular necrosis: Secondary | ICD-10-CM | POA: Diagnosis not present

## 2017-12-22 DIAGNOSIS — E875 Hyperkalemia: Secondary | ICD-10-CM | POA: Diagnosis not present

## 2017-12-22 DIAGNOSIS — I10 Essential (primary) hypertension: Secondary | ICD-10-CM | POA: Diagnosis not present

## 2017-12-22 DIAGNOSIS — J9601 Acute respiratory failure with hypoxia: Secondary | ICD-10-CM | POA: Diagnosis not present

## 2017-12-22 LAB — COMPREHENSIVE METABOLIC PANEL
ALK PHOS: 61 U/L (ref 38–126)
ALT: 22 U/L (ref 17–63)
AST: 25 U/L (ref 15–41)
Albumin: 2.6 g/dL — ABNORMAL LOW (ref 3.5–5.0)
Anion gap: 18 — ABNORMAL HIGH (ref 5–15)
BILIRUBIN TOTAL: 1.8 mg/dL — AB (ref 0.3–1.2)
BUN: 92 mg/dL — ABNORMAL HIGH (ref 6–20)
CALCIUM: 6.4 mg/dL — AB (ref 8.9–10.3)
CO2: 23 mmol/L (ref 22–32)
CREATININE: 4.81 mg/dL — AB (ref 0.61–1.24)
Chloride: 98 mmol/L — ABNORMAL LOW (ref 101–111)
GFR calc Af Amer: 13 mL/min — ABNORMAL LOW (ref >60)
GFR, EST NON AFRICAN AMERICAN: 11 mL/min — AB (ref >60)
Glucose, Bld: 506 mg/dL (ref 65–99)
Potassium: 4 mmol/L (ref 3.5–5.1)
Sodium: 139 mmol/L (ref 135–145)
TOTAL PROTEIN: 5.3 g/dL — AB (ref 6.5–8.1)

## 2017-12-22 LAB — CBC
HCT: 24.3 % — ABNORMAL LOW (ref 39.0–52.0)
Hemoglobin: 7.9 g/dL — ABNORMAL LOW (ref 13.0–17.0)
MCH: 25.2 pg — AB (ref 26.0–34.0)
MCHC: 32.5 g/dL (ref 30.0–36.0)
MCV: 77.6 fL — AB (ref 78.0–100.0)
PLATELETS: 49 10*3/uL — AB (ref 150–400)
RBC: 3.13 MIL/uL — AB (ref 4.22–5.81)
RDW: 18.2 % — ABNORMAL HIGH (ref 11.5–15.5)
WBC: 3.4 10*3/uL — ABNORMAL LOW (ref 4.0–10.5)

## 2017-12-22 LAB — GLUCOSE, CAPILLARY
GLUCOSE-CAPILLARY: 518 mg/dL — AB (ref 65–99)
GLUCOSE-CAPILLARY: 468 mg/dL — AB (ref 65–99)
Glucose-Capillary: 306 mg/dL — ABNORMAL HIGH (ref 65–99)
Glucose-Capillary: 179 mg/dL — ABNORMAL HIGH (ref 65–99)
Glucose-Capillary: 247 mg/dL — ABNORMAL HIGH (ref 65–99)

## 2017-12-22 MED ORDER — INSULIN DETEMIR 100 UNIT/ML ~~LOC~~ SOLN
10.0000 [IU] | Freq: Every day | SUBCUTANEOUS | Status: DC
  Administered 2017-12-22 – 2017-12-23 (×2): 10 [IU] via SUBCUTANEOUS
  Filled 2017-12-22 (×2): qty 0.1

## 2017-12-22 MED ORDER — DARBEPOETIN ALFA 200 MCG/0.4ML IJ SOSY
200.0000 ug | PREFILLED_SYRINGE | INTRAMUSCULAR | Status: DC
  Administered 2017-12-22: 200 ug via SUBCUTANEOUS
  Filled 2017-12-22 (×3): qty 0.4

## 2017-12-22 MED ORDER — INSULIN ASPART 100 UNIT/ML ~~LOC~~ SOLN
18.0000 [IU] | Freq: Once | SUBCUTANEOUS | Status: AC
  Administered 2017-12-22: 18 [IU] via SUBCUTANEOUS

## 2017-12-22 NOTE — Consult Note (Addendum)
Hospital Consult    Reason for Consult:  In need of possible [facility] and permanent dialysis access Requesting Physician:  Lorrene Reid MRN #:  [id number]  History of Present Illness: This is a 67 y.o. male who has AKI on CKD who was admitted with shortness of breath and diarrhea (diagnosed with Giardia and started on Flagyl 12/21/17).  On a recent admission, the pt was having hemoptysis as well.  He was diagnosed with Goodpasture syndrome & treated with Cytoxan.  he also has pancytopenia, which was thought to be secondary to Goodpasture Syndrome.  This was stopped and he was placed on steroids.  He has cirrhosis with ascites.  He has diabetes and is on insulin for this.    [facility] has been consulted for HD access.  I talked with the pt via his son, who was translating.  His son says the hemoptysis has pretty much resolved.  He denies any blood per rectum.  Denies any hx of CVA/MI/chest pain.    He is on a CCB and BB for blood pressure management.    The pt is right hand dominant.  There is an IV present in the left forearm.    Past Medical History:  Diagnosis Date  . Anemia   . CHF (congestive heart failure) ([place])   . Cirrhosis ([place])   . Cirrhosis ([place])   . CKD (chronic kidney disease), stage IV ([place])   . Goodpasture's disease [facility])    on outpatient plasmapheresis/notes 11/20/2017  . Grade I diastolic dysfunction 40/98/1191  . History of anemia due to chronic kidney disease   . History of blood transfusion X 1   "UGIB; low blood count"  . History of plasmapheresis    "qod" (11/20/2017)  . Hypertension   . Pancytopenia ([place]) 08/20/2017  . Type II diabetes mellitus ([place])     Past Surgical History:  Procedure Laterality Date  . CATARACT EXTRACTION W/ INTRAOCULAR LENS  IMPLANT, BILATERAL Bilateral   . IR FLUORO GUIDE CV LINE RIGHT  11/10/2017  . IR PARACENTESIS  12/10/2017  . IR REMOVAL TUN CV CATH W/O FL  11/28/2017  . IR US GUIDE VASC ACCESS RIGHT  11/10/2017  . NECK SURGERY  2007   "back of  neck; no hardware in there"    Allergies  Allergen Reactions  . Heparin     Patient is Muslim and is not permitted Pork derative due to religious belief)    Prior to Admission medications   Medication Sig Start Date End Date Taking? Authorizing Provider  acetaminophen (TYLENOL) 650 MG CR tablet Take 650 mg by mouth every 8 (eight) hours as needed for pain.   Yes [provider]  aminocaproic acid (AMICAR) 500 MG tablet Take 1 tablet (500 mg total) by mouth every 8 (eight) hours. 12/15/17 01/14/18 Yes Curt Bears, MD  amLODipine (NORVASC) 10 MG tablet Take 1 tablet (10 mg total) daily by mouth. 06/20/17 11/06/24 Yes Gildardo Pounds, NP  carvedilol (COREG) 12.5 MG tablet Take 12.5 mg by mouth 2 (two) times daily with a meal.   Yes [provider]  ferrous gluconate (IRON 27) 240 (27 FE) MG tablet Take 240 mg by mouth daily.    Yes [provider]  furosemide (LASIX) 40 MG tablet Take 1 tablet (40 mg total) by mouth 2 (two) times daily. 11/18/17 11/18/18 Yes Regalado, Belkys A, MD  insulin NPH-regular Human (NOVOLIN 70/30) (70-30) 100 UNIT/ML injection Inject 18 Units into the skin 2 (two) times daily with a  meal. 11/18/17  Yes Regalado, Belkys A, MD  Multiple Vitamins-Minerals (MULTIVITAMIN WITH MINERALS) tablet Take 1 tablet by mouth daily.   Yes [provider]  predniSONE (DELTASONE) 20 MG tablet Take 2 tablets (40 mg total) by mouth daily with breakfast. Patient taking differently: Take 40 mg by mouth daily with lunch.  11/30/17  Yes Dessa Phi, DO  sodium bicarbonate 650 MG tablet Take 2 tablets (1,300 mg total) by mouth 2 (two) times daily. 11/18/17  Yes Regalado, Belkys A, MD  spironolactone (ALDACTONE) 50 MG tablet Take 50 mg by mouth daily. 12/09/17  Yes [provider]  dextromethorphan-guaiFENesin (MUCINEX DM) 30-600 MG 12hr tablet Take 1 tablet by mouth 2 (two) times daily as needed for cough. Patient not taking: Reported on 12/15/2017  11/18/17   Elmarie Shiley, MD    Social History   Socioeconomic History  . Marital status: Married    Spouse name: Not on file  . Number of children: Not on file  . Years of education: Not on file  . Highest education level: Not on file  Occupational History  . Not on file  Social Needs  . Financial resource strain: Not on file  . Food insecurity:    Worry: Not on file    Inability: Not on file  . Transportation needs:    Medical: Not on file    Non-medical: Not on file  Tobacco Use  . Smoking status: Never Smoker  . Smokeless tobacco: Never Used  Substance and Sexual Activity  . Alcohol use: Never    Frequency: Never  . Drug use: Never  . Sexual activity: Not on file  Lifestyle  . Physical activity:    Days per week: Not on file    Minutes per session: Not on file  . Stress: Not on file  Relationships  . Social connections:    Talks on phone: Not on file    Gets together: Not on file    Attends religious service: Not on file    Active member of club or organization: Not on file    Attends meetings of clubs or organizations: Not on file    Relationship status: Not on file  . Intimate partner violence:    Fear of current or ex partner: Not on file    Emotionally abused: Not on file    Physically abused: Not on file    Forced sexual activity: Not on file  Other Topics Concern  . Not on file  Social History Narrative  . Not on file     Family History  Problem Relation Age of Onset  . Diabetes Mellitus II Sister   . Stroke Brother     ROS: [x]  Positive   [ ]  Negative   [ ]  All sytems reviewed and are negative  Cardiac: []  chest pain/pressure []  palpitations [x]  SOB [x]  DOE  Vascular: []  pain in legs while walking []  pain in legs at rest []  pain in legs at night []  non-healing ulcers []  hx of DVT []  swelling in legs  Pulmonary: []  productive cough []  asthma/wheezing []  home O2  Neurologic: []  weakness in []  arms []  legs []  numbness in  []  arms []  legs []  hx of CVA []  mini stroke [] difficulty speaking or slurred speech []  temporary loss of vision in one eye []  dizziness  Hematologic: []  hx of cancer []  bleeding problems []  problems with blood clotting easily [x]  anemia [x]  pancytopenia  Endocrine:   [x]  diabetes []  thyroid  disease  GI [x]  coughing up blood [x]  cirrhosis  []  blood in stool  GU: [x]  CKD/renal failure []  HD--[]  M/W/F or []  T/T/S []  burning with urination []  blood in urine  Psychiatric: []  anxiety []  depression  Musculoskeletal: []  arthritis []  joint pain  Integumentary: []  rashes []  ulcers  Constitutional: []  fever []  chills   Physical Examination  Vitals:   12/22/17 0725 12/22/17 1129  BP: (!) 142/75 (!) 148/77  Pulse: 87 78  Resp: 15 17  Temp: 98.3 F (36.8 C) 98.4 F (36.9 C)  SpO2: 97% 98%   Body mass index is 25.75 kg/m.  General:  WDWN in NAD Gait: Not observed HENT: WNL, normocephalic Pulmonary: normal non-labored breathing, without Rales, rhonchi,  wheezing Cardiac: regular, without  Murmurs, rubs or gallops; without carotid bruits Abdomen:  soft, NT/ND, no masses Skin: without rashes Vascular Exam/Pulses:  Right Left  Radial 2+ (normal) 2+ (normal)  Ulnar 2+ (normal) 2+ (normal)  DP 2+ (normal) 2+ (normal)  PT 2+ (normal) 2+ (normal)   Extremities: without ischemic changes, without Gangrene , without cellulitis; without open wounds;  Musculoskeletal: no muscle wasting or atrophy  Neurologic: A&O X 3;  No focal weakness or paresthesias are detected; speech is fluent/normal Psychiatric:  The pt has Normal affect.   CBC    Component Value Date/Time   WBC 3.4 (L) 12/22/2017 0324   RBC 3.13 (L) 12/22/2017 0324   HGB 7.9 (L) 12/22/2017 0324   HGB 10.0 (L) 12/15/2017 1508   HGB 9.2 (L) 06/20/2017 1613   HCT 24.3 (L) 12/22/2017 0324   HCT 29.7 (L) 06/20/2017 1613   PLT 49 (L) 12/22/2017 0324   PLT 78 (L) 12/15/2017 1508   PLT 101 (L) 06/20/2017  1613   MCV 77.6 (L) 12/22/2017 0324   MCV 76 (L) 06/20/2017 1613   MCH 25.2 (L) 12/22/2017 0324   MCHC 32.5 12/22/2017 0324   RDW 18.2 (H) 12/22/2017 0324   RDW 16.2 (H) 06/20/2017 1613   LYMPHSABS 1.0 12/19/2017 1414   MONOABS 1.0 12/19/2017 1414   EOSABS 0.1 12/19/2017 1414   BASOSABS 0.0 12/19/2017 1414    BMET    Component Value Date/Time   NA 139 12/22/2017 0324   NA 137 06/20/2017 1613   K 4.0 12/22/2017 0324   CL 98 (L) 12/22/2017 0324   CO2 23 12/22/2017 0324   GLUCOSE 506 (HH) 12/22/2017 0324   BUN 92 (H) 12/22/2017 0324   BUN 62 (H) 06/20/2017 1613   CREATININE 4.81 (H) 12/22/2017 0324   CREATININE 3.68 (HH) 12/15/2017 1508   CREATININE 1.23 08/31/2015 0859   CALCIUM 6.4 (LL) 12/22/2017 0324   GFRNONAA 11 (L) 12/22/2017 0324   GFRNONAA 16 (L) 12/15/2017 1508   GFRNONAA 62 08/31/2015 0859   GFRAA 13 (L) 12/22/2017 0324   GFRAA 18 (L) 12/15/2017 1508   GFRAA 71 08/31/2015 0859    COAGS: Lab Results  Component Value Date   INR 1.18 11/25/2017   INR 1.75 11/17/2017   INR 1.62 11/14/2017     Non-Invasive Vascular Imaging:   BUE vein mapping is ordered and pending  Statin:  No. Beta Blocker:  Yes.   Aspirin:  No. ACEI:  No. ARB:  No. CCB use:  Yes Other antiplatelets/anticoagulants:  No.    ASSESSMENT/PLAN: This is a 67 y.o. male with AKI on CKD in need of permanent HD access and possible TDC   -pt is right hand dominant. He does have an IV in his left  forearm, which we will ask them to move and restrict the left arm.   -BUE vein mapping is pending.  Once this is complete, we can determine the best access for the pt.   -discussed the different options with the pt via his son as Optometrist.  Discussed possibility of steal sx and we would need to know about pain or numbness in his hand should he develop this.  -Dr. Donzetta Matters will be by to see the pt this afternoon.   Leontine Locket, PA-C [facility] [phone]  I have  independently interviewed and examined patient and agree with PA assessment and plan above.  I reviewed vein mapping and he has possible basilic vein on the left for access creation.  We will discuss with nephrology tomorrow regarding need for tunneled catheter.  I will make him n.p.o. tonight in case we can get him scheduled for the morning.  We will follow-up his platelet count in the morning as well prior to proceeding with surgery.  Brandon C. Donzetta Matters, MD [facility] [place] Office: [phone] Pager: [phone]

## 2017-12-22 NOTE — Progress Notes (Signed)
Inpatient Diabetes Program Recommendations  AACE/ADA: New Consensus Statement on Inpatient Glycemic Control (2015)  Target Ranges:  Prepandial:   less than 140 mg/dL      Peak postprandial:   less than 180 mg/dL (1-2 hours)      Critically ill patients:  140 - 180 mg/dL   Lab Results  Component Value Date   GLUCAP 468 (H) 12/22/2017   HGBA1C 8.1 06/20/2017   Review of Glycemic Control  Diabetes history: DM 2 Outpatient Diabetes medications: 70/30 18 units BID (basal insulin equivalent 25.2 units, short acting insulin equivalent 10.8 units) Current orders for Inpatient glycemic control: Levemir 10 units, Novolog Moderate Correction 0-15 units tid  Inpatient Diabetes Program Recommendations:    Patient receiving PO prednisone 40 mg Daily  Glucose increased significantly this am. Patient is on more basal insulin at home. Consider increasing Levemir to 20 units, consider Novolog 3 units tid meal coverage if patient consumes at least 50% of meals.  Thanks,  Tama Headings RN, MSN, BC-ADM, [facility] Inpatient Diabetes Coordinator Team Pager [phone] (8a-5p)

## 2017-12-22 NOTE — Progress Notes (Signed)
CRITICAL VALUE ALERT  Critical Value: Glucose 506 + Calcium 6.4  Date & Time Notified: 12/22/17 @ 0504  Provider Notified: X. Blount, NP  Orders Received/Actions taken:     - Order for Novolog insulin recieved.   - Medication given, RN will continue to monitor.

## 2017-12-22 NOTE — Progress Notes (Addendum)
CRITICAL VALUE ALERT  Critical Value:  Glucose 467. Night shift already notified provider or calcium 6.4.  Date & Time Notied:  12/22/17 0730  Provider Notified: Arrien MD  Orders Received/Actions taken: awaiting page back.   New orders received for levemere

## 2017-12-22 NOTE — Progress Notes (Signed)
PROGRESS NOTE    William [name]  JZP:[id number] DOB: 14-Dec-1950 DOA: 12/19/2017 PCP: Gildardo Pounds, NP    Brief Narrative:  67 year old male who presented with dyspnea and cough. He does have significant past medical history for Goodpasture syndrome, pancytopenia, cirrhosis, chronic kidney disease stage IV, type 2 diabetes mellitus and heart failure with preserved ejection fraction. Patient had recent hospitalizations due to renal failure and decompensated liver disease. On day of admission apparently patient had dyspnea and fevers for 24 hours. On initial physical examination blood pressure 157/76, heart rate 94, respiratory 24, oxygen saturation 95%. Moist mucous membranes, lungs scattered rhonchi bilaterally, decreased breath sounds at bases, possibly rales, poor respiratory effort, heart S1-S2 present and rhythmic, no gallops or gallops, positive systolic murmur, abdomen severely distended but nontender, positive pitting lower extremity edema bilaterally. Sodium 141, potassium 5.4, chloride 104, bicarbonate 25, glucose 64, BUN 81, creatinine 4.16, white count 7.3, 1110.9, hematocrit 33.8, platelets 89. Chest x-ray with alveolar infiltrates in the right upper and lower lobe, CT chest with bilateral central upper lobes alveolar infiltrates, bibasilar atelectasis.  Patient was admitted to the hospital working diagnosis of acute hypoxic respiratory failure, to rule out healthcare associated pneumonia.   Assessment & Plan:   Active Problems:   Type II diabetes mellitus with renal manifestations (HCC)   Acute respiratory failure with hypoxia (HCC)   Pancytopenia (HCC)   Essential hypertension   Goodpasture's syndrome (HCC) with associated hemoptysis   Cirrhosis of liver with ascites (HCC)   CKD (chronic kidney disease) stage 4, GFR 15-29 ml/min (HCC)   Hyperkalemia   HCAP (healthcare-associated pneumonia)   Ascites   1. Acute on chronic hypoxic respiratory failure. Patient with improved  dyspnea, follow chest film personally reviewed noted stable to improved infiltrates in the upper lobes, will continue current regime of prednisone, bronchodilators, oxymetry monitoring and supplemental oxygen per nasal cannula. Doubt infectious process, will hold on antibiotic therapy for now. No signs of alveolar hemorrhage.    2. Chronic kidney disease stage IV. Stable renal function with serum cr at 4,81 wit K ar 4,0 and serum bicarbonate at 23, anion gap at 18. Will stop broad spectrum antibiotic therapy, avoid other nephrotoxic medications or hypotension.   3. Chronic liver failure. Positive distention but no significant clinical ascites, no clinical encephalopathy. Check INR in am, liver enzymes norma, with total bilirubin trending down to 1,8 from 2,9.  4. Type 2 diabetes mellitus. Uncontrolled hyperglycemia, will continue glucose cover and monitoring with insulin sliding scale added levimir this am. Patient tolerating po well, continue to taper prednisone. Elevated anion gap to 18, continue aggressive insulin therapy and follow renal panel in am.   5. Hypertension. Blood pressure stable, will continue blood pressure monitoring. Continue blood pressure control with amlodipine, carvedilol,   6. Thrombocytopenia. Slow improvement in platelet count, now up to 49 from 43, no bleeding.   7. Goodpasture syndrome. Patient is sp pulse steroids and cytoxan. Renal biopsy did not confirm GPS.   8. Giardia. Will continue metronidazole, will change to po. Continue to monitor diarrhea.    DVT prophylaxis: heparin  Code Status: full Family Communication: I spoke with patient's family at the bedside and all questions were addressed.  Disposition Plan: home in 24 for 48 hours.   Consultants:   Nephrology   Procedures:     Antimicrobials:   Metronidazole.     Subjective: Patient with improved dyspnea. No nausea or vomiting, but persistent diarrhea, no hematochezia or melena. Diarrhea  started after  immunosuppressive therapy on last admission and has been worsening since then.   Objective: Vitals:   12/21/17 2018 12/22/17 0015 12/22/17 0404 12/22/17 0725  BP: (!) 152/78 (!) 151/75 (!) 153/73 (!) 142/75  Pulse: 90 99 82 87  Resp: 16 14  15   Temp: 98 F (36.7 C) 98.5 F (36.9 C) 98.2 F (36.8 C) 98.3 F (36.8 C)  TempSrc: Oral Oral Oral Oral  SpO2: 98% 95% 95% 97%  Weight:   70.2 kg (154 lb 12.2 oz)   Height:        Intake/Output Summary (Last 24 hours) at 12/22/2017 0816 Last data filed at 12/22/2017 0600 Gross per 24 hour  Intake 960 ml  Output -  Net 960 ml   Filed Weights   12/20/17 1928 12/21/17 0431 12/22/17 0404  Weight: 69.3 kg (152 lb 12.5 oz) 68.6 kg (151 lb 3.8 oz) 70.2 kg (154 lb 12.2 oz)    Examination:   General: deconditioned  Neurology: Awake and alert, non focal  E ENT: mild pallor, no icterus, oral mucosa moist Cardiovascular: No JVD. S1-S2 present, rhythmic, no gallops, rubs, or murmurs. No lower extremity edema. Pulmonary: decreased breath sounds bilaterally, no wheezing, rhonchi but bilateral rales. Gastrointestinal. Abdomen distended and tympanic with no organomegaly, non tender, no rebound or guarding Skin. No rashes Musculoskeletal: no joint deformities     Data Reviewed: I have personally reviewed following labs and imaging studies  CBC: Recent Labs  Lab 12/15/17 1508 12/19/17 1414  12/19/17 1938 12/19/17 2230 12/20/17 0458 12/21/17 0556 12/22/17 0324  WBC 3.3* 7.3  --   --  4.9 5.2 2.5* 3.4*  NEUTROABS 2.2 5.0  --   --   --   --   --   --   HGB 10.0* 10.9*   < > 8.8* 8.9* 9.8* 8.5* 7.9*  HCT 30.7* 33.8*   < > 26.0* 28.0* 31.1* 26.6* 24.3*  MCV 79.9 78.4  --   --  80.2 79.7 80.4 77.6*  PLT 78* 89*  --   --  59* 81* 43* 49*   < > = values in this interval not displayed.   Basic Metabolic Panel: Recent Labs  Lab 12/15/17 1508  12/19/17 1414 12/19/17 1421 12/19/17 1938 12/20/17 0458 12/21/17 0330  12/22/17 0324  NA 142  --  141 140 140 141 142 139  K 4.1  --  5.4* 5.2* 4.9 5.3* 5.9* 4.0  CL 106  --  104 103 105 104 103 98*  CO2 27  --  25  --   --  22 20* 23  GLUCOSE 109  --  64* 60* 110* 128* 165* 506*  BUN 83*  --  81* 64* 63* 77* 83* 92*  CREATININE 3.68*   < > 4.16* 3.60* 3.40* 3.99* 4.92* 4.81*  CALCIUM 6.8*  --  7.0*  --   --  6.5* 6.6* 6.4*   < > = values in this interval not displayed.   GFR: Estimated Creatinine Clearance: 13.1 mL/min (A) (by C-G formula based on SCr of 4.81 mg/dL (H)). Liver Function Tests: Recent Labs  Lab 12/15/17 1508 12/19/17 1414 12/21/17 0330 12/22/17 0324  AST 27 35 36 25  ALT 26 27 21 22   ALKPHOS 102 75 60 61  BILITOT 0.6 1.9* 2.9* 1.8*  PROT 5.8* 6.1* 5.2* 5.3*  ALBUMIN 3.4* 3.4* 2.7* 2.6*   Recent Labs  Lab 12/19/17 1414  LIPASE 25   No results for input(s): AMMONIA in the last 168  hours. Coagulation Profile: No results for input(s): INR, PROTIME in the last 168 hours. Cardiac Enzymes: No results for input(s): CKTOTAL, CKMB, CKMBINDEX, TROPONINI in the last 168 hours. BNP (last 3 results) Recent Labs    09/25/17 1614  PROBNP 380.0*   HbA1C: No results for input(s): HGBA1C in the last 72 hours. CBG: Recent Labs  Lab 12/21/17 1206 12/21/17 1633 12/21/17 2130 12/22/17 0515 12/22/17 0724  GLUCAP 374* 372* 312* 518* 468*   Lipid Profile: No results for input(s): CHOL, HDL, LDLCALC, TRIG, CHOLHDL, LDLDIRECT in the last 72 hours. Thyroid Function Tests: No results for input(s): TSH, T4TOTAL, FREET4, T3FREE, THYROIDAB in the last 72 hours. Anemia Panel: No results for input(s): VITAMINB12, FOLATE, FERRITIN, TIBC, IRON, RETICCTPCT in the last 72 hours.    Radiology Studies: I have reviewed all of the imaging during this hospital visit personally     Scheduled Meds: . aminocaproic acid  500 mg Oral Q8H  . amLODipine  10 mg Oral Daily  . carvedilol  12.5 mg Oral BID WC  . feeding supplement (PRO-STAT SUGAR  FREE 64)  30 mL Oral BID  . ferrous gluconate  324 mg Oral Q breakfast  . insulin aspart  0-15 Units Subcutaneous TID WC  . insulin detemir  10 Units Subcutaneous Daily  . predniSONE  40 mg Oral Q breakfast  . saccharomyces boulardii  250 mg Oral BID  . sodium bicarbonate  1,300 mg Oral BID   Continuous Infusions: . ceFEPime (MAXIPIME) IV Stopped (12/21/17 1526)  . metronidazole Stopped (12/22/17 0241)     LOS: 3 days        Adem Costlow Gerome Apley, MD [facility] Pager [phone]

## 2017-12-22 NOTE — Progress Notes (Addendum)
[facility] Rounding Note  Background: 67 y.o. year-old with hx of cirrhosis/ascites, DM2,  admitted April 2019 AKI on CKD,  hemoptysis and lower ext edema.  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  Renal biopsy 11/17/17 showed diabetic GN w/ secondary FSGS and not enough glomeruli to undergo IF staining for anti-GBM Ab's (showed no evidence of active GN per path report).  Had some dialysis but renal function improved and was dc'd off dialysis. Was dc'd on 4/16 and readmitted on 4/18 w/ worsening hemoptysis, had PLEX w/ FFP until hemoptysis resolved and re-pulsed with IV steroids. Creat peaked at 5.4 and on dc creat was 3.78 on 4/27.  Seen by oncology 5/13 Dr Julien Nordmann for pancytpenia w/rec  to continue Amicar but no need for continued Nplate.  Presented again  5/17 with c/o SOB, cough, hypoxemia.  CT chest showed findings suggesting bilat infiltrates possilbe edema. Rec'd high dose IV lasix.  He had abd distension but known hx of cirrhosis with ascites. He was started on IV abx as well for possible PNA.  In hospital diuresed 1.1 L first day and 1.8 L yesterday.  Creat was 3.6 on admission , 3.9 5/18 and and 4.92 5/19 day of consult.   Date            Creat summary               08/2015         1.23 06/2017       3.01 08/2017         3.54 09/2017         3.53 11/2017       3.6- 5.7  Assessment/Recommendations 1. CKD stage IV - no biopsy evidence that patient actually has/had renal GN.  Biopsy was negative for active GN even though there was not enough tissue to do IF for GBM staining.  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 The UA's recently have not shown any microhematuria. The kidneys are bilaterally small. Not clear there has ever been a definite acute renal GN.  Pulm infiltrates are concerning and are extensive and have  basically never really subsided upon reviewing the last 6 months of CXR's and CT's.  Clinically not significantly vol overloaded to account for the imaging changes. Has ascites which is old and has known hx of cirrhosis.  Creat bumped ^ with diuresis x 2 days, another point arguing against simple vol overload.   1. Need pulm input and may need bronch.   2. The only thing we are going on for goodpasture's is a low titer anti-GBM x 1 .  Repeat. 3. Again the renal biopsy was not suggestive of any type of GN, rather indicative of ATN superimposed on significant diabetic and hypertensive renal injuries which are chronic. Kidneys by [place] are small and echodense.  4. I suspect diabetic/ htn'sive renal failure stage IV picked up late last year and that the lung problems are unrelated.   5. Continue to hold lasix and consult pulmonary for persistent lung symptoms and imaging changes. Another consideration could be R heart cath for volume measurements but contrast would prob be prohibitive.   6. I think [facility] should be consulted for placement of permanent vascular access as he is not far away from the need for long term dialysis. I have ordered vein  mapping and consulted [facility] (was OK with son). MAY need TDC at same time but will see what renal fx does next few days.  2  Cirrhosis w ascites - known 3  DM2 insulin requiring 4  HTN on ccb/ coreg and diuretics at home. Holding diuretics 5  Volume - no edema or JVD, lungs relatively clear on exam, has moderate ascites 6 HCAP - getting cefipime.  7 Acute hypoxic resp failure - suspect more rel to infection, less so to volume 8 Diarrhea w/+ giardia 9 dCHF 10 Anemia - part of cytopenias. Check Fe studies. Adding  aranesp. 11 Bones - checking PTH   Subjective/Interval History: Does not speak [place] Son in with him and says no hemoptysis, no CP, no SOB and appetite good Thinks swelling that was present on admission has also gotten better Discussed renal issues with  son and he is OK with plans  Objective Vital signs in last 24 hours: Vitals:   12/21/17 2018 12/22/17 0015 12/22/17 0404 12/22/17 0725  BP: (!) 152/78 (!) 151/75 (!) 153/73 (!) 142/75  Pulse: 90 99 82 87  Resp: 16 14  15   Temp: 98 F (36.7 C) 98.5 F (36.9 C) 98.2 F (36.8 C) 98.3 F (36.8 C)  TempSrc: Oral Oral Oral Oral  SpO2: 98% 95% 95% 97%  Weight:   70.2 kg (154 lb 12.2 oz)   Height:       Weight change: 0.9 kg (1 lb 15.8 oz)  Intake/Output Summary (Last 24 hours) at 12/22/2017 1128 Last data filed at 12/22/2017 1000 Gross per 24 hour  Intake 1360 ml  Output -  Net 1360 ml   Physical Exam:  Blood pressure (!) 142/75, pulse 87, temperature 98.3 F (36.8 C), temperature source Oral, resp. rate 15, height 5' 5"  (1.651 m), weight 70.2 kg (154 lb 12.2 oz), SpO2 97 %.   Alert, no distress, calm and pleasant Smiling, O2 on, no ^ WOB No rash Sclera anicteric No jvd or bruits  Chest clear bilat to bases Regular rhythm S1S2 normal. No S3 Abd soft, not tender, moderate ascites  No LE edema   Recent Labs  Lab 12/15/17 1508 12/19/17 1414 12/19/17 1421 12/19/17 1938 12/20/17 0458 12/21/17 0330 12/22/17 0324  NA 142 141 140 140 141 142 139  K 4.1 5.4* 5.2* 4.9 5.3* 5.9* 4.0  CL 106 104 103 105 104 103 98*  CO2 27 25  --   --  22 20* 23  GLUCOSE 109 64* 60* 110* 128* 165* 506*  BUN 83* 81* 64* 63* 77* 83* 92*  CREATININE 3.68* 4.16* 3.60* 3.40* 3.99* 4.92* 4.81*  CALCIUM 6.8* 7.0*  --   --  6.5* 6.6* 6.4*    Recent Labs  Lab 12/19/17 1414 12/21/17 0330 12/22/17 0324  AST 35 36 25  ALT 27 21 22   ALKPHOS 75 60 61  BILITOT 1.9* 2.9* 1.8*  PROT 6.1* 5.2* 5.3*  ALBUMIN 3.4* 2.7* 2.6*   Recent Labs  Lab 12/19/17 1414  LIPASE 25    Recent Labs  Lab 12/15/17 1508  12/19/17 1414  12/19/17 2230 12/20/17 0458 12/21/17 0556 12/22/17 0324  WBC 3.3*   < > 7.3  --  4.9 5.2 2.5* 3.4*  NEUTROABS 2.2  --  5.0  --   --   --   --   --   HGB 10.0*  --  10.9*    < > 8.9* 9.8* 8.5* 7.9*  HCT 30.7*  --  33.8*   < >  28.0* 31.1* 26.6* 24.3*  MCV 79.9  --  78.4  --  80.2 79.7 80.4 77.6*  PLT 78*   < > 89*  --  59* 81* 43* 49*   < > = values in this interval not displayed.    Recent Labs  Lab 12/21/17 1206 12/21/17 1633 12/21/17 2130 12/22/17 0515 12/22/17 0724  GLUCAP 374* 372* 312* 518* 468*   Renal biopsy: 11/17/17 DIABETIC GLOMERULOPATHY IN ASSOCIATION WITH MODERATE TO SEVERE ARTERIONEPHROSCLEROSIS AND SECONDARY FOCAL AND SEGMENTAL GLOMERUSCLEROSIS. ACUTE TUBULAR INJURY. NO EVIDENCE OF AN ACTIVE GLOMERULONEPHRITIS.  Studies/Results: Dg Chest 2 View  Result Date: 12/22/2017 CLINICAL DATA:  Shortness of breath EXAM: CHEST - 2 VIEW COMPARISON:  Chest radiograph and chest CT Dec 19, 2017 FINDINGS: There is patchy interstitial and alveolar edema throughout the lungs, essentially stable compared to the recent prior study. Heart is enlarged with pulmonary venous hypertension. No adenopathy. There is aortic atherosclerosis. No bone lesions. IMPRESSION: Pulmonary vascular congestion with interstitial and alveolar edema. Likely congestive heart failure. A degree of pneumonia and/or ARDS superimposed cannot be excluded radiographically. Appearance stable compared to recent prior studies. Electronically Signed   By: Lowella Grip III M.D.   On: 12/22/2017 09:31   Medications: . metronidazole Stopped (12/22/17 1042)   . aminocaproic acid  500 mg Oral Q8H  . amLODipine  10 mg Oral Daily  . carvedilol  12.5 mg Oral BID WC  . feeding supplement (PRO-STAT SUGAR FREE 64)  30 mL Oral BID  . ferrous gluconate  324 mg Oral Q breakfast  . insulin aspart  0-15 Units Subcutaneous TID WC  . insulin detemir  10 Units Subcutaneous Daily  . predniSONE  40 mg Oral Q breakfast  . saccharomyces boulardii  250 mg Oral BID  . sodium bicarbonate  1,300 mg Oral BID   Jamal Maes, MD [facility] [phone] Pager 12/22/2017, 11:51 AM

## 2017-12-22 NOTE — Progress Notes (Signed)
VASCULAR LAB  Vein mapping was performed 11/13/17.  Results below.     11/13/2017 4:28 PM - Interface, Rad Results In   Narrative   UPPER EXTREMITY VEIN MAPPING History: Pre-access.  Examination Guidelines: A complete evaluation includes B-mode imaging, spectral doppler, color doppler, and power doppler as needed of all accessible portions of each vessel. Bilateral testing is considered an integral part of a complete examination. Limited examinations for reoccurring indications may be performed as noted.  +-----------------+-------------+----------+---------+ Right Cephalic  Diameter (mm)Depth (mm)Findings  +-----------------+-------------+----------+---------+ Shoulder       1.40     2.70        +-----------------+-------------+----------+---------+ Prox upper arm    2.60     1.70  branching +-----------------+-------------+----------+---------+ Mid upper arm    1.70     2.40        +-----------------+-------------+----------+---------+ Dist upper arm    1.50     2.10        +-----------------+-------------+----------+---------+ Antecubital fossa  1.40     1.80        +-----------------+-------------+----------+---------+ Prox forearm     3.00     2.40  branching +-----------------+-------------+----------+---------+ Mid forearm     2.70     1.70        +-----------------+-------------+----------+---------+ Dist forearm     2.60     2.20        +-----------------+-------------+----------+---------+  +-----------------+-------------+----------+---------+ Right Basilic  Diameter (mm)Depth (mm)Findings  +-----------------+-------------+----------+---------+ Shoulder       4.00     6.70        +-----------------+-------------+----------+---------+ Prox upper arm    4.00     7.70         +-----------------+-------------+----------+---------+ Mid upper arm    4.50     7.40        +-----------------+-------------+----------+---------+ Dist upper arm    3.10     4.80  branching +-----------------+-------------+----------+---------+ Antecubital fossa  3.30     2.20  branching +-----------------+-------------+----------+---------+ Prox forearm     1.70     1.60        +-----------------+-------------+----------+---------+ Mid forearm     1.60     1.70        +-----------------+-------------+----------+---------+ Distal forearm    1.10     1.20        +-----------------+-------------+----------+---------+  +-----------------+-------------+----------+---------+ Left Cephalic  Diameter (mm)Depth (mm)Findings  +-----------------+-------------+----------+---------+ Shoulder       2.40     6.10        +-----------------+-------------+----------+---------+ Prox upper arm    1.90     1.40  branching +-----------------+-------------+----------+---------+ Mid upper arm    1.10     2.00        +-----------------+-------------+----------+---------+ Dist upper arm    1.40     2.20        +-----------------+-------------+----------+---------+ Antecubital fossa  1.40     1.40        +-----------------+-------------+----------+---------+ Prox forearm     1.70     1.70        +-----------------+-------------+----------+---------+ Mid forearm     1.70     1.80        +-----------------+-------------+----------+---------+ Dist forearm     2.20     2.00        +-----------------+-------------+----------+---------+  +-----------------+-------------+----------+---------+ Left Basilic   Diameter (mm)Depth (mm)Findings   +-----------------+-------------+----------+---------+ Shoulder       7.50     4.30        +-----------------+-------------+----------+---------+ Prox upper arm    6.60  4.30  branching +-----------------+-------------+----------+---------+ Mid upper arm    4.80     8.90        +-----------------+-------------+----------+---------+ Dist upper arm    3.80     3.80  branching +-----------------+-------------+----------+---------+ Antecubital fossa  2.90     1.70        +-----------------+-------------+----------+---------+ Prox forearm     2.60     1.60  branching +-----------------+-------------+----------+---------+ Mid forearm     2.10     1.30  branching +-----------------+-------------+----------+---------+ Distal forearm    2.00     1.30        +-----------------+-------------+----------+---------+  Final Interpretation:     *See table(s) above for measurements and observations.  Adele Barthel Electronically signed by Adele Barthel on 11/13/2017 at 4:28:27 PM.         Mauro Kaufmann, [place]ia Durell, RVT 12/22/2017, 1:58 PM

## 2017-12-23 ENCOUNTER — Encounter (HOSPITAL_COMMUNITY)
Admission: EM | Disposition: A | Discharge status: Home / Self Care | Payer: Self-pay | Source: Home / Self Care | Attending: Internal Medicine

## 2017-12-23 ENCOUNTER — Inpatient Hospital Stay (HOSPITAL_COMMUNITY): Payer: Medicare Other | Admitting: Anesthesiology

## 2017-12-23 DIAGNOSIS — N185 Chronic kidney disease, stage 5: Secondary | ICD-10-CM | POA: Diagnosis not present

## 2017-12-23 DIAGNOSIS — R0600 Dyspnea, unspecified: Secondary | ICD-10-CM

## 2017-12-23 DIAGNOSIS — M310 Hypersensitivity angiitis: Secondary | ICD-10-CM | POA: Diagnosis not present

## 2017-12-23 DIAGNOSIS — I129 Hypertensive chronic kidney disease with stage 1 through stage 4 chronic kidney disease, or unspecified chronic kidney disease: Secondary | ICD-10-CM | POA: Diagnosis not present

## 2017-12-23 DIAGNOSIS — N184 Chronic kidney disease, stage 4 (severe): Secondary | ICD-10-CM | POA: Diagnosis not present

## 2017-12-23 DIAGNOSIS — Z794 Long term (current) use of insulin: Secondary | ICD-10-CM | POA: Diagnosis not present

## 2017-12-23 DIAGNOSIS — J9601 Acute respiratory failure with hypoxia: Secondary | ICD-10-CM | POA: Diagnosis not present

## 2017-12-23 DIAGNOSIS — J9621 Acute and chronic respiratory failure with hypoxia: Secondary | ICD-10-CM | POA: Diagnosis not present

## 2017-12-23 DIAGNOSIS — J189 Pneumonia, unspecified organism: Secondary | ICD-10-CM | POA: Diagnosis not present

## 2017-12-23 DIAGNOSIS — R188 Other ascites: Secondary | ICD-10-CM | POA: Diagnosis not present

## 2017-12-23 DIAGNOSIS — E875 Hyperkalemia: Secondary | ICD-10-CM | POA: Diagnosis not present

## 2017-12-23 DIAGNOSIS — A419 Sepsis, unspecified organism: Secondary | ICD-10-CM | POA: Diagnosis not present

## 2017-12-23 DIAGNOSIS — E1122 Type 2 diabetes mellitus with diabetic chronic kidney disease: Secondary | ICD-10-CM | POA: Diagnosis not present

## 2017-12-23 DIAGNOSIS — I10 Essential (primary) hypertension: Secondary | ICD-10-CM | POA: Diagnosis not present

## 2017-12-23 DIAGNOSIS — N170 Acute kidney failure with tubular necrosis: Secondary | ICD-10-CM | POA: Diagnosis not present

## 2017-12-23 LAB — RENAL FUNCTION PANEL
Albumin: 2.5 g/dL — ABNORMAL LOW (ref 3.5–5.0)
Anion gap: 14 (ref 5–15)
BUN: 93 mg/dL — ABNORMAL HIGH (ref 6–20)
CHLORIDE: 99 mmol/L — AB (ref 101–111)
CO2: 24 mmol/L (ref 22–32)
CREATININE: 4.52 mg/dL — AB (ref 0.61–1.24)
Calcium: 5.9 mg/dL — CL (ref 8.9–10.3)
GFR calc non Af Amer: 12 mL/min — ABNORMAL LOW (ref >60)
GFR, EST AFRICAN AMERICAN: 14 mL/min — AB (ref >60)
Glucose, Bld: 367 mg/dL — ABNORMAL HIGH (ref 65–99)
Phosphorus: 7 mg/dL — ABNORMAL HIGH (ref 2.5–4.6)
Potassium: 3.8 mmol/L (ref 3.5–5.1)
Sodium: 137 mmol/L (ref 135–145)

## 2017-12-23 LAB — CBC
HCT: 23.9 % — ABNORMAL LOW (ref 39.0–52.0)
Hemoglobin: 7.8 g/dL — ABNORMAL LOW (ref 13.0–17.0)
MCH: 25.4 pg — ABNORMAL LOW (ref 26.0–34.0)
MCHC: 32.6 g/dL (ref 30.0–36.0)
MCV: 77.9 fL — ABNORMAL LOW (ref 78.0–100.0)
PLATELETS: 46 10*3/uL — AB (ref 150–400)
RBC: 3.07 MIL/uL — AB (ref 4.22–5.81)
RDW: 18 % — ABNORMAL HIGH (ref 11.5–15.5)
WBC: 3.5 10*3/uL — ABNORMAL LOW (ref 4.0–10.5)

## 2017-12-23 LAB — GLUCOSE, CAPILLARY
GLUCOSE-CAPILLARY: 425 mg/dL — AB (ref 65–99)
GLUCOSE-CAPILLARY: 410 mg/dL — AB (ref 65–99)
GLUCOSE-CAPILLARY: 347 mg/dL — AB (ref 65–99)
Glucose-Capillary: 180 mg/dL — ABNORMAL HIGH (ref 65–99)
Glucose-Capillary: 178 mg/dL — ABNORMAL HIGH (ref 65–99)

## 2017-12-23 LAB — IRON AND TIBC
Iron: 119 ug/dL (ref 45–182)
Saturation Ratios: 78 % — ABNORMAL HIGH (ref 17.9–39.5)
TIBC: 153 ug/dL — ABNORMAL LOW (ref 250–450)
UIBC: 34 ug/dL

## 2017-12-23 SURGERY — INSERTION OF ARTERIOVENOUS (AV) GORE-TEX GRAFT ARM
Anesthesia: General | Site: Arm Lower

## 2017-12-23 MED ORDER — INSULIN DETEMIR 100 UNIT/ML ~~LOC~~ SOLN
10.0000 [IU] | Freq: Two times a day (BID) | SUBCUTANEOUS | Status: DC
  Administered 2017-12-23 – 2017-12-25 (×4): 10 [IU] via SUBCUTANEOUS
  Filled 2017-12-23 (×9): qty 0.1

## 2017-12-23 MED ORDER — INSULIN ASPART 100 UNIT/ML ~~LOC~~ SOLN
10.0000 [IU] | Freq: Once | SUBCUTANEOUS | Status: AC
  Administered 2017-12-23: 10 [IU] via SUBCUTANEOUS

## 2017-12-23 MED ORDER — MIDAZOLAM HCL 2 MG/2ML IJ SOLN
INTRAMUSCULAR | Status: AC
  Filled 2017-12-23: qty 2

## 2017-12-23 MED ORDER — ONDANSETRON HCL 4 MG/2ML IJ SOLN
INTRAMUSCULAR | Status: AC
  Filled 2017-12-23: qty 2

## 2017-12-23 MED ORDER — INSULIN ASPART 100 UNIT/ML ~~LOC~~ SOLN
15.0000 [IU] | Freq: Once | SUBCUTANEOUS | Status: AC
  Administered 2017-12-23: 15 [IU] via SUBCUTANEOUS

## 2017-12-23 MED ORDER — CALCIUM ACETATE (PHOS BINDER) 667 MG PO CAPS
1334.0000 mg | ORAL_CAPSULE | Freq: Three times a day (TID) | ORAL | Status: DC
  Administered 2017-12-23 – 2017-12-26 (×6): 1334 mg via ORAL
  Filled 2017-12-23 (×6): qty 2

## 2017-12-23 MED ORDER — METRONIDAZOLE 500 MG PO TABS
500.0000 mg | ORAL_TABLET | Freq: Two times a day (BID) | ORAL | Status: DC
  Administered 2017-12-23 – 2017-12-25 (×5): 500 mg via ORAL
  Filled 2017-12-23 (×5): qty 1

## 2017-12-23 MED ORDER — METRONIDAZOLE 500 MG PO TABS
500.0000 mg | ORAL_TABLET | Freq: Three times a day (TID) | ORAL | Status: DC
  Administered 2017-12-23: 500 mg via ORAL
  Filled 2017-12-23: qty 1

## 2017-12-23 MED ORDER — CALCIUM GLUCONATE 10 % IV SOLN
2.0000 g | Freq: Once | INTRAVENOUS | Status: AC
  Administered 2017-12-23: 2 g via INTRAVENOUS
  Filled 2017-12-23: qty 20

## 2017-12-23 MED ORDER — LIDOCAINE 2% (20 MG/ML) 5 ML SYRINGE
INTRAMUSCULAR | Status: AC
  Filled 2017-12-23: qty 5

## 2017-12-23 MED ORDER — FENTANYL CITRATE (PF) 250 MCG/5ML IJ SOLN
INTRAMUSCULAR | Status: AC
  Filled 2017-12-23: qty 5

## 2017-12-23 MED ORDER — PREDNISONE 20 MG PO TABS
20.0000 mg | ORAL_TABLET | Freq: Every day | ORAL | Status: DC
  Administered 2017-12-24 – 2017-12-25 (×2): 20 mg via ORAL
  Filled 2017-12-23 (×2): qty 1

## 2017-12-23 NOTE — Progress Notes (Signed)
Pt not requiring BIPAP at this time.  Last worn in ER.

## 2017-12-23 NOTE — Progress Notes (Signed)
[facility] Rounding Note  Background: 67 y.o. year-old with hx of cirrhosis/ascites, DM2,  admitted April 2019 AKI on CKD,  hemoptysis and lower ext edema.  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  Renal biopsy 11/17/17 showed diabetic GN w/ secondary FSGS and not enough glomeruli to undergo IF staining for anti-GBM Ab's (showed no evidence of active GN per path report).  Had some dialysis but renal function improved and was dc'd off dialysis. Was dc'd on 4/16 and readmitted on 4/18 w/ worsening hemoptysis, had PLEX w/ FFP until hemoptysis resolved and re-pulsed with IV steroids. Creat peaked at 5.4 and on dc creat was 3.78 on 4/27.  Seen by oncology 5/13 Dr Julien Nordmann for pancytpenia w/rec  to continue Amicar but no need for continued Nplate.  Presented again  5/17 with c/o SOB, cough, hypoxemia.  CT chest showed findings suggesting bilat infiltrates possilbe edema. Rec'd high dose IV lasix.  He had abd distension but known hx of cirrhosis with ascites. He was started on IV abx as well for possible PNA.  In hospital diuresed 1.1 L first day and 1.8 L yesterday.  Creat was 3.6 on admission , 3.9 5/18 and and 4.92 5/19 day of consult.   Date            Creat summary               08/2015         1.23 06/2017       3.01 08/2017         3.54 09/2017         3.53 11/2017       3.6- 5.7  Assessment/Recommendations  1. CKD stage IV/V - no biopsy evidence that patient actually has/had renal GN.  Biopsy was negative for active GN even though there was not enough tissue to do IF for GBM staining.  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 Kidneys bilaterally small and echodense. Probably looking at long term effects HTN/DM - not active GN. Creat bumped ^ with diuresis x 2 days, another point arguing against simple vol overload. Renal fx sl  better (creatinine down a hair) today  The only thing we have  going on for Goodpasture's is a low titer anti-GBM x 1 . Repeating the anti GBM  (pending) 1. Continue to hold lasix and please consult pulmonary for persistent lung symptoms and imaging changes.   2. [facility] (Dr. Donzetta Matters) will place AVF today 5/21  3. I don't think needs catheter just yet, but is not far away from the need for long term dialysis.   2. Pulmonary infiltrates with prior hemoptysis - Pulm infiltrates are extensive and have basically never really subsided upon reviewing the last 6 months of CXR's and CT's.  Clinically not significantly vol overloaded to account for the imaging changes. Needs for pulm to see. 3.  Cirrhosis w ascites - known 4.  DM2 insulin requiring 5.  HTN on ccb/ coreg and diuretics at home. Holding diuretics at this time 6.  Volume - no edema or JVD, lungs remain relatively clear on exam, moderate ascites 7.  HCAP - getting cefipime.  8.  Acute hypoxic resp failure - suspect more rel to infection, less so to volume 9.  Diarrhea w/+ giardia, on flagyl 9 dCHF 10 Anemia - part of cytopenias.  Iron studies are fine.  Added weekly Aranesp, dosed with 200 on 5/20 11 CKD-MBD - PTH pending. Added ca acetate for low ca and hyperphosphatemia  Subjective/Interval History: For AVF today if can get BS down (400's)  Objective Vital signs in last 24 hours: Vitals:   12/22/17 2353 12/23/17 0300 12/23/17 0500 12/23/17 0820  BP: (!) 153/58 140/68  (!) 169/80  Pulse: 84 72  79  Resp: 16 15    Temp: 97.6 F (36.4 C) 97.6 F (36.4 C)  98 F (36.7 C)  TempSrc: Oral Oral    SpO2: 99% 99%    Weight:   74.3 kg (163 lb 12.8 oz)   Height:       Weight change: 4.1 kg (9 lb 0.6 oz)  Intake/Output Summary (Last 24 hours) at 12/23/2017 0921 Last data filed at 12/22/2017 2200 Gross per 24 hour  Intake 1280 ml  Output -  Net 1280 ml   Physical Exam:  Blood pressure (!) 169/80, pulse 79, temperature 98 F (36.7 C), resp.  rate 15, height 5' 5"  (1.651 m), weight 74.3 kg (163 lb 12.8 oz), SpO2 99 %.   Alert, no distress, calm and pleasant Smiling, O2 on, no ^ WOB No rash Sclera anicteric No jvd or bruits  Chest clear bilat to bases Regular rhythm S1S2 normal. No S3 Abd soft, not tender, moderate ascites  No LE edema   Recent Labs  Lab 12/19/17 1414 12/19/17 1421 12/19/17 1938 12/20/17 0458 12/21/17 0330 12/22/17 0324 12/23/17 0232  NA 141 140 140 141 142 139 137  K 5.4* 5.2* 4.9 5.3* 5.9* 4.0 3.8  CL 104 103 105 104 103 98* 99*  CO2 25  --   --  22 20* 23 24  GLUCOSE 64* 60* 110* 128* 165* 506* 367*  BUN 81* 64* 63* 77* 83* 92* 93*  CREATININE 4.16* 3.60* 3.40* 3.99* 4.92* 4.81* 4.52*  CALCIUM 7.0*  --   --  6.5* 6.6* 6.4* 5.9*  PHOS  --   --   --   --   --   --  7.0*    Recent Labs  Lab 12/19/17 1414 12/21/17 0330 12/22/17 0324 12/23/17 0232  AST 35 36 25  --   ALT 27 21 22   --   ALKPHOS 75 60 61  --   BILITOT 1.9* 2.9* 1.8*  --   PROT 6.1* 5.2* 5.3*  --   ALBUMIN 3.4* 2.7* 2.6* 2.5*   Recent Labs  Lab 12/19/17 1414  LIPASE 25    Recent Labs  Lab 12/19/17 1414  12/20/17 0458 12/21/17 0556 12/22/17 0324 12/23/17 0232  WBC 7.3   < > 5.2 2.5* 3.4* 3.5*  NEUTROABS 5.0  --   --   --   --   --   HGB 10.9*   < > 9.8* 8.5* 7.9* 7.8*  HCT 33.8*   < > 31.1* 26.6* 24.3* 23.9*  MCV 78.4   < > 79.7 80.4 77.6* 77.9*  PLT 89*   < > 81* 43* 49* 46*   < > = values in this interval not displayed.    Recent Labs  Lab 12/22/17 1129 12/22/17 1625 12/22/17 2205 12/23/17 0726 12/23/17 0918  GLUCAP 306* 179* 247* 425* 410*   Renal biopsy: 11/17/17 DIABETIC GLOMERULOPATHY IN ASSOCIATION WITH MODERATE TO SEVERE ARTERIONEPHROSCLEROSIS AND SECONDARY FOCAL AND SEGMENTAL GLOMERUSCLEROSIS. ACUTE TUBULAR INJURY. NO EVIDENCE OF AN ACTIVE GLOMERULONEPHRITIS.  Studies/Results: Dg Chest 2 View  Result Date: 12/22/2017 CLINICAL DATA:  Shortness of breath EXAM: CHEST - 2 VIEW COMPARISON:   Chest radiograph and chest CT Dec 19, 2017 FINDINGS: There is patchy interstitial and alveolar edema throughout the lungs, essentially stable compared to the recent prior study. Heart is enlarged with pulmonary venous hypertension. No adenopathy. There is aortic atherosclerosis. No bone lesions. IMPRESSION: Pulmonary vascular congestion with interstitial and alveolar edema. Likely congestive heart failure. A degree of pneumonia and/or ARDS superimposed cannot be excluded radiographically. Appearance stable compared to recent prior studies. Electronically Signed   By: Lowella Grip III M.D.   On: 12/22/2017 09:31   Medications:  . aminocaproic acid  500 mg Oral Q8H  . amLODipine  10 mg Oral Daily  . carvedilol  12.5 mg Oral BID WC  . darbepoetin (ARANESP) injection - NON-DIALYSIS  200 mcg Subcutaneous Q Mon-1800  . feeding supplement (PRO-STAT SUGAR FREE 64)  30 mL Oral BID  . ferrous gluconate  324 mg Oral Q breakfast  . insulin aspart  0-15 Units Subcutaneous TID WC  . insulin detemir  10 Units Subcutaneous Daily  . metroNIDAZOLE  500 mg Oral Q8H  . predniSONE  40 mg Oral Q breakfast  . saccharomyces boulardii  250 mg Oral BID  . sodium bicarbonate  1,300 mg Oral BID   Jamal Maes, MD [facility] [phone] Pager 12/23/2017, 9:21 AM

## 2017-12-23 NOTE — Progress Notes (Signed)
This nurse contacted by infectious disease department. Instructed to discontinue contact precautions unless patient is incontinent or in diapers. Contact precautions may be reinitiated if patient is not longer continent.   English as a second language teacher

## 2017-12-23 NOTE — Progress Notes (Signed)
Sent to dr arrien:  "2W18: William [name], CBG this AM was 425. Insulin aspart 15 units and levemir 10 units ordered. any additional orders? pt to surgery this AM. thank you."    Riley Kill, RN

## 2017-12-23 NOTE — Progress Notes (Addendum)
  Progress Note    12/23/2017 9:42 AM Day of Surgery  Subjective: No complaints this morning  Vitals:   12/23/17 0300 12/23/17 0820  BP: 140/68 (!) 169/80  Pulse: 72 79  Resp: 15   Temp: 97.6 F (36.4 C) 98 F (36.7 C)  SpO2: 99%     Physical Exam: Awake and alert Nonlabored respirations Palpable radial pulses bilaterally  CBC    Component Value Date/Time   WBC 3.5 (L) 12/23/2017 0232   RBC 3.07 (L) 12/23/2017 0232   HGB 7.8 (L) 12/23/2017 0232   HGB 10.0 (L) 12/15/2017 1508   HGB 9.2 (L) 06/20/2017 1613   HCT 23.9 (L) 12/23/2017 0232   HCT 29.7 (L) 06/20/2017 1613   PLT 46 (L) 12/23/2017 0232   PLT 78 (L) 12/15/2017 1508   PLT 101 (L) 06/20/2017 1613   MCV 77.9 (L) 12/23/2017 0232   MCV 76 (L) 06/20/2017 1613   MCH 25.4 (L) 12/23/2017 0232   MCHC 32.6 12/23/2017 0232   RDW 18.0 (H) 12/23/2017 0232   RDW 16.2 (H) 06/20/2017 1613   LYMPHSABS 1.0 12/19/2017 1414   MONOABS 1.0 12/19/2017 1414   EOSABS 0.1 12/19/2017 1414   BASOSABS 0.0 12/19/2017 1414    BMET    Component Value Date/Time   NA 137 12/23/2017 0232   NA 137 06/20/2017 1613   K 3.8 12/23/2017 0232   CL 99 (L) 12/23/2017 0232   CO2 24 12/23/2017 0232   GLUCOSE 367 (H) 12/23/2017 0232   BUN 93 (H) 12/23/2017 0232   BUN 62 (H) 06/20/2017 1613   CREATININE 4.52 (H) 12/23/2017 0232   CREATININE 3.68 (HH) 12/15/2017 1508   CREATININE 1.23 08/31/2015 0859   CALCIUM 5.9 (LL) 12/23/2017 0232   GFRNONAA 12 (L) 12/23/2017 0232   GFRNONAA 16 (L) 12/15/2017 1508   GFRNONAA 62 08/31/2015 0859   GFRAA 14 (L) 12/23/2017 0232   GFRAA 18 (L) 12/15/2017 1508   GFRAA 71 08/31/2015 0859    INR    Component Value Date/Time   INR 1.18 11/25/2017 0735     Intake/Output Summary (Last 24 hours) at 12/23/2017 0942 Last data filed at 12/22/2017 2200 Gross per 24 hour  Intake 1280 ml  Output -  Net 1280 ml     Assessment:  67 y.o. male is in need of permanent dialysis access.  I discussed with Dr.  Lorrene Reid we will hold off on tunnel catheter for now.  Appears to have vein for creation of basilic vein AV fistula on the left.  Blood sugar elevated this morning.  Plan: We will attempt to get blood sugar under control for surgery today. If we cannot we will reschedule for later in the week.   Oluwasemilore Bahl C. Donzetta Matters, MD [facility] Office: [phone] Pager: [phone]  12/23/2017 9:42 AM  Addendum: Surgery cancelled today due to elevated blood glucose. Will reschedule for Friday. NPO past midnight Thursday.  [name]

## 2017-12-23 NOTE — Progress Notes (Signed)
On call MD informed of critical lab calcium 5.9. Response pending.

## 2017-12-23 NOTE — Progress Notes (Signed)
Dr. Cathlean Sauer paged the following at 0924 12/23/17:  "2W18:Digilio.Recieved call from Anesthesia.Need CBG below 280 for surg.30 min recheck post insulin was 410 "

## 2017-12-23 NOTE — Evaluation (Signed)
Physical Therapy Evaluation Patient Details Name: William [name] MRN: [id number] DOB: Aug 26, 1950 Today's Date: 12/23/2017   History of Present Illness  67 year old male who presented with dyspnea and cough. He does have significant past medical history for Goodpasture syndrome, pancytopenia, cirrhosis, chronic kidney disease stage IV, type 2 diabetes mellitus and heart failure with preserved ejection fraction. Patient had recent hospitalizations due to renal failure and decompensated liver disease. On day of admission apparently patient had dyspnea and fevers for 24 hours.   Clinical Impression   Pt admitted with above diagnosis. Pt currently with functional limitations due to the deficits listed below (see PT Problem List). Overall moving well; Noting tendency to reach out for UE support with amb, so would like to try with a cane next session; Offered video/telephonic interpreter, however, pt and son declined;  Pt will benefit from skilled PT to increase their independence and safety with mobility to allow discharge to the venue listed below.       Follow Up Recommendations No PT follow up    Equipment Recommendations  Cane    Recommendations for Other Services       Precautions / Restrictions Precautions Precautions: Fall      Mobility  Bed Mobility Overal bed mobility: Modified Independent                Transfers Overall transfer level: Needs assistance Equipment used: None Transfers: Sit to/from Stand Sit to Stand: Min guard         General transfer comment: Noted heavy dependence on UE push, and immediately stabilized with UE support on furniture at initial stand  Ambulation/Gait Ambulation/Gait assistance: Min guard Ambulation Distance (Feet): 150 Feet Assistive device: (pushing vitals machine) Gait Pattern/deviations: Step-through pattern     General Gait Details: very quick steps; noted tendency initially to reach out for UE support, so had pt push vitals  machine  Stairs            Wheelchair Mobility    Modified Rankin (Stroke Patients Only)       Balance Overall balance assessment: Needs assistance             Standing balance comment: Tends to stabilize with UE support                             Pertinent Vitals/Pain Pain Assessment: No/denies pain    Home Living Family/patient expects to be discharged to:: Private residence Living Arrangements: Other relatives;Children Available Help at Discharge: Family;Available PRN/intermittently(Near 24 hours a day) Type of Home: House Home Access: Stairs to enter Entrance Stairs-Rails: (to be determined) Entrance Stairs-Number of Steps: (to be determined) Home Layout: Two level(moved his bed downstairs; 1/2 bath downstairs) Home Equipment: None      Prior Function Level of Independence: Independent         Comments: Pt and son report no falls     Hand Dominance        Extremity/Trunk Assessment   Upper Extremity Assessment Upper Extremity Assessment: Overall WFL for tasks assessed    Lower Extremity Assessment Lower Extremity Assessment: Generalized weakness       Communication   Communication: Prefers language other than English(Arabic/Sudanese; offered telephonic and video interpreter, pt and son declined)  Cognition Arousal/Alertness: Awake/alert Behavior During Therapy: WFL for tasks assessed/performed;Impulsive Overall Cognitive Status: Difficult to assess  General Comments General comments (skin integrity, edema, etc.): Walked on Room Air and O2 sats ranged in low to high 90s; noted one reading of 88%, though that was likely poor reading    Exercises     Assessment/Plan    PT Assessment Patient needs continued PT services  PT Problem List Decreased strength;Decreased balance;Decreased activity tolerance       PT Treatment Interventions DME instruction;Gait  training;Stair training;Functional mobility training;Therapeutic activities;Therapeutic exercise;Patient/family education    PT Goals (Current goals can be found in the Care Plan section)  Acute Rehab PT Goals Patient Stated Goal: did not state PT Goal Formulation: With family Time For Goal Achievement: 01/06/18 Potential to Achieve Goals: Good    Frequency Min 3X/week   Barriers to discharge Inaccessible home environment flight of steps to access bathroom    Co-evaluation               AM-PAC PT "6 Clicks" Daily Activity  Outcome Measure Difficulty turning over in bed (including adjusting bedclothes, sheets and blankets)?: None Difficulty moving from lying on back to sitting on the side of the bed? : None Difficulty sitting down on and standing up from a chair with arms (e.g., wheelchair, bedside commode, etc,.)?: A Little Help needed moving to and from a bed to chair (including a wheelchair)?: None Help needed walking in hospital room?: A Little Help needed climbing 3-5 steps with a railing? : A Little 6 Click Score: 21    End of Session Equipment Utilized During Treatment: Gait belt Activity Tolerance: Patient tolerated treatment well Patient left: in bed;with call bell/phone within reach Nurse Communication: Mobility status;Other (comment)(left pt on room air) PT Visit Diagnosis: Unsteadiness on feet (R26.81)    Time: 0539-7673 PT Time Calculation (min) (ACUTE ONLY): 19 min   Charges:   PT Evaluation $PT Eval Low Complexity: 1 Low     PT G Codes:        Roney Marion, PT  Acute Rehabilitation Services Pager [phone] Office [phone]   [place] 12/23/2017, 3:58 PM

## 2017-12-23 NOTE — Progress Notes (Signed)
Report called to [place] in [place]. [phone] 12/23/17. Consent on chart

## 2017-12-23 NOTE — Anesthesia Preprocedure Evaluation (Deleted)
Anesthesia Evaluation  Patient identified by MRN, date of birth, ID band Patient awake    Reviewed: Allergy & Precautions, NPO status , Patient's Chart, lab work & pertinent test results, reviewed documented beta blocker date and time   Airway Mallampati: II  TM Distance: >3 FB Neck ROM: Full    Dental  (+) Dental Advisory Given   Pulmonary neg pulmonary ROS,    breath sounds clear to auscultation       Cardiovascular hypertension, Pt. on home beta blockers and Pt. on medications +CHF   Rhythm:Regular Rate:Normal     Neuro/Psych negative neurological ROS  negative psych ROS   GI/Hepatic GERD  ,(+) Cirrhosis       ,   Endo/Other  diabetes, Poorly Controlled, Type 2, Insulin Dependent  Renal/GU ESRFRenal diseaseGoodpasture's syndrome  negative genitourinary   Musculoskeletal Chronic pain   Abdominal   Peds  Hematology  (+) anemia , Thrombocytopenia   Anesthesia Other Findings   Reproductive/Obstetrics                             Anesthesia Physical Anesthesia Plan  ASA: IV  Anesthesia Plan: General   Post-op Pain Management:    Induction: Intravenous  PONV Risk Score and Plan: Treatment may vary due to age or medical condition, Ondansetron and Propofol infusion  Airway Management Planned: LMA  Additional Equipment: None  Intra-op Plan:   Post-operative Plan: Extubation in OR  Informed Consent: I have reviewed the patients History and Physical, chart, labs and discussed the procedure including the risks, benefits and alternatives for the proposed anesthesia with the patient or authorized representative who has indicated his/her understanding and acceptance.   Dental advisory given  Plan Discussed with: CRNA and Anesthesiologist  Anesthesia Plan Comments:         Anesthesia Quick Evaluation

## 2017-12-23 NOTE — Progress Notes (Signed)
Inpatient Diabetes Program Recommendations  AACE/ADA: New Consensus Statement on Inpatient Glycemic Control (2015)  Target Ranges:  Prepandial:   less than 140 mg/dL      Peak postprandial:   less than 180 mg/dL (1-2 hours)      Critically ill patients:  140 - 180 mg/dL   Results for William [name], William [name] (MRN [id number]) as of 12/23/2017 08:47  Ref. Range 12/22/2017 07:24 12/22/2017 11:29 12/22/2017 16:25 12/22/2017 22:05 12/23/2017 07:26  Glucose-Capillary Latest Ref Range: 65 - 99 mg/dL 468 (H) 306 (H) 179 (H) 247 (H) 425 (H)   Review of Glycemic Control  Diabetes history: DM 2 Outpatient Diabetes medications: 70/30 18 units BID (basal insulin equivalent 25.2 units, short acting insulin equivalent 10.8 units) Current orders for Inpatient glycemic control: Levemir 10 units, Novolog Moderate Correction 0-15 units tid  Inpatient Diabetes Program Recommendations:    Patient receiving PO prednisone 40 mg Daily  Glucose increased significantly this am. Patient is on more basal insulin at home. Consider increasing Levemir to 20 units, consider adding Novolog HS scale. if patient consumes at least 50% of meals.  Thanks,  Tama Headings RN, MSN, BC-ADM, [facility] Inpatient Diabetes Coordinator Team Pager [phone] (8a-5p)

## 2017-12-23 NOTE — Progress Notes (Signed)
PROGRESS NOTE    William [name]  QQP:[id number] DOB: 04/10/1951 DOA: 12/19/2017 PCP: Gildardo Pounds, NP    Brief Narrative:  67 year old male who presented with dyspnea and cough. He does have significant past medical history for Goodpasture syndrome, pancytopenia, cirrhosis, chronic kidney disease stage IV, type 2 diabetes mellitus and heart failure with preserved ejection fraction. Patient had recent hospitalizations due to renal failure and decompensated liver disease. On day of admission apparently patient had dyspnea and fevers for 24 hours. On initial physical examination blood pressure 157/76, heart rate 94, respiratory 24, oxygen saturation 95%. Moist mucous membranes, lungs scattered rhonchi bilaterally, decreased breath sounds at bases, possibly rales, poor respiratory effort, heart S1-S2 present and rhythmic, no gallops or gallops, positive systolic murmur, abdomen severely distended but nontender, positive pitting lower extremity edema bilaterally. Sodium 141, potassium 5.4, chloride 104, bicarbonate 25, glucose 64, BUN 81, creatinine 4.16, white count 7.3, 1110.9, hematocrit 33.8, platelets 89. Chest x-ray with alveolar infiltrates in the right upper and lower lobe, CT chest with bilateral central upper lobes alveolar infiltrates, bibasilar atelectasis.  Patient was admitted to the hospital working diagnosis of acute hypoxic respiratory failure, to rule out healthcare associated pneumonia.    Assessment & Plan:   Active Problems:   Type II diabetes mellitus with renal manifestations (HCC)   Acute respiratory failure with hypoxia (HCC)   Pancytopenia (HCC)   Essential hypertension   Goodpasture's syndrome (HCC) with associated hemoptysis   Cirrhosis of liver with ascites (HCC)   CKD (chronic kidney disease) stage 4, GFR 15-29 ml/min (HCC)   Hyperkalemia   HCAP (healthcare-associated pneumonia)   Ascites   1. Acute on chronic hypoxic respiratory failure. Stable infiltrates,  continue bronchodilator therapy, oxymetry monitoring and supplemental oxygen per nasal cannula. Continue to hold on antibiotic therapy. Oxygen saturation 99% on 2 LPM of supplemental oxygen. Hb has remained stable, no clinical sings of alveolar hemorrhage. Outpatient records personally reviewed, plan for transbronchial biopsy when improved thrombocytopenia.   2. Chronic kidney disease stage IV. Serum cr at 4,52 wit K ar 3,8 and serum bicarbonate at 24, anion gap at 14. 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 Patient scheduled for HD access (fistula) placement on this admission. No current indications for urgent HD today. Will decrease prednisone to 20 mg daily. Continue phoslo, and darbapoetin. Hold on sodium bicarbonate.    3. Chronic liver failure. No encephalopathy, check INR in am. No clinical ascites.   4. Type 2 diabetes mellitus with uncontrolled hyperglyecmia. Patient required extra 25 units of short acting insulin due to uncontrolled hyperglycemia, will increase basal insulin to 10 units bid and will decrease steroid dose. Patient tolerating po well.   5. Hypertension. Continue with amlodipine and carvedilol, for blood pressure control.   6. Thrombocytopenia. Stable thrombocytopenia, with plt at 46, no signs of bleeding, reactive thrombocytopenia in the setting of liver disease.   7. Goodpasture syndrome. Had low positive anti GBM, ANCA negative. Patient is sp pulse steroids and cytoxan. Renal biopsy did not confirm GPS. Will continue supportive medical therapy. Will decrease steroids by 50%.    8. Giardia induced diarrhea. On PO metronidazole, diarrhea slowly improving, out of bed as tolerated and physical therapy evaluation.   DVT prophylaxis: heparin  Code Status: full Family Communication: I spoke with patient's family at the bedside and all  questions were addressed.  Disposition Plan: home in 24 for 48 hours.   Consultants:  Nephrology   Procedures:     Antimicrobials:   Metronidazole.     Subjective: Diarrhea has been improving, no nausea or vomiting, tolerating po well, improved dyspnea. No chest pain. Has not been ambulating.   Objective: Vitals:   12/22/17 2353 12/23/17 0300 12/23/17 0500 12/23/17 0820  BP: (!) 153/58 140/68  (!) 169/80  Pulse: 84 72  79  Resp: 16 15    Temp: 97.6 F (36.4 C) 97.6 F (36.4 C)  98 F (36.7 C)  TempSrc: Oral Oral    SpO2: 99% 99%    Weight:   74.3 kg (163 lb 12.8 oz)   Height:        Intake/Output Summary (Last 24 hours) at 12/23/2017 0913 Last data filed at 12/22/2017 2200 Gross per 24 hour  Intake 1280 ml  Output -  Net 1280 ml   Filed Weights   12/21/17 0431 12/22/17 0404 12/23/17 0500  Weight: 68.6 kg (151 lb 3.8 oz) 70.2 kg (154 lb 12.2 oz) 74.3 kg (163 lb 12.8 oz)    Examination:   General: Not in pain or dyspnea, deconditioned  Neurology: Awake and alert, non focal  E ENT: mild pallor, no icterus, oral mucosa moist Cardiovascular: No JVD. S1-S2 present, rhythmic, no gallops, rubs, or murmurs. No lower extremity edema. Pulmonary: decreased breath sounds bilaterally at bases, adequate air movement, no wheezing or rhonchi, scattered rales. Gastrointestinal. Abdomen distended and tympanic with no organomegaly, non tender, no rebound or guarding Skin. No rashes Musculoskeletal: no joint deformities     Data Reviewed: I have personally reviewed following labs and imaging studies  CBC: Recent Labs  Lab 12/19/17 1414  12/19/17 2230 12/20/17 0458 12/21/17 0556 12/22/17 0324 12/23/17 0232  WBC 7.3  --  4.9 5.2 2.5* 3.4* 3.5*  NEUTROABS 5.0  --   --   --   --   --   --   HGB 10.9*   < > 8.9* 9.8* 8.5* 7.9* 7.8*  HCT 33.8*   < > 28.0* 31.1* 26.6* 24.3* 23.9*  MCV 78.4  --  80.2 79.7 80.4 77.6* 77.9*  PLT 89*  --  59* 81* 43* 49* 46*   < >  = values in this interval not displayed.   Basic Metabolic Panel: Recent Labs  Lab 12/19/17 1414  12/19/17 1938 12/20/17 0458 12/21/17 0330 12/22/17 0324 12/23/17 0232  NA 141   < > 140 141 142 139 137  K 5.4*   < > 4.9 5.3* 5.9* 4.0 3.8  CL 104   < > 105 104 103 98* 99*  CO2 25  --   --  22 20* 23 24  GLUCOSE 64*   < > 110* 128* 165* 506* 367*  BUN 81*   < > 63* 77* 83* 92* 93*  CREATININE 4.16*   < > 3.40* 3.99* 4.92* 4.81* 4.52*  CALCIUM 7.0*  --   --  6.5* 6.6* 6.4* 5.9*  PHOS  --   --   --   --   --   --  7.0*   < > = values in this interval not displayed.   GFR: Estimated Creatinine Clearance: 15.1 mL/min (A) (by C-G formula based on SCr of 4.52 mg/dL (H)). Liver Function Tests: Recent Labs  Lab 12/19/17 1414 12/21/17 0330 12/22/17 0324 12/23/17 0232  AST 35 36 25  --   ALT 27 21 22   --   ALKPHOS 75 60 61  --   BILITOT 1.9* 2.9* 1.8*  --  PROT 6.1* 5.2* 5.3*  --   ALBUMIN 3.4* 2.7* 2.6* 2.5*   Recent Labs  Lab 12/19/17 1414  LIPASE 25   No results for input(s): AMMONIA in the last 168 hours. Coagulation Profile: No results for input(s): INR, PROTIME in the last 168 hours. Cardiac Enzymes: No results for input(s): CKTOTAL, CKMB, CKMBINDEX, TROPONINI in the last 168 hours. BNP (last 3 results) Recent Labs    09/25/17 1614  PROBNP 380.0*   HbA1C: No results for input(s): HGBA1C in the last 72 hours. CBG: Recent Labs  Lab 12/22/17 0724 12/22/17 1129 12/22/17 1625 12/22/17 2205 12/23/17 0726  GLUCAP 468* 306* 179* 247* 425*   Lipid Profile: No results for input(s): CHOL, HDL, LDLCALC, TRIG, CHOLHDL, LDLDIRECT in the last 72 hours. Thyroid Function Tests: No results for input(s): TSH, T4TOTAL, FREET4, T3FREE, THYROIDAB in the last 72 hours. Anemia Panel: Recent Labs    12/23/17 0232  TIBC 153*  IRON 119      Radiology Studies: I have reviewed all of the imaging during this hospital visit personally     Scheduled Meds: .  aminocaproic acid  500 mg Oral Q8H  . amLODipine  10 mg Oral Daily  . carvedilol  12.5 mg Oral BID WC  . darbepoetin (ARANESP) injection - NON-DIALYSIS  200 mcg Subcutaneous Q Mon-1800  . feeding supplement (PRO-STAT SUGAR FREE 64)  30 mL Oral BID  . ferrous gluconate  324 mg Oral Q breakfast  . insulin aspart  0-15 Units Subcutaneous TID WC  . insulin detemir  10 Units Subcutaneous Daily  . predniSONE  40 mg Oral Q breakfast  . saccharomyces boulardii  250 mg Oral BID  . sodium bicarbonate  1,300 mg Oral BID   Continuous Infusions: . metronidazole 500 mg (12/23/17 0855)     LOS: 4 days        Nathen Balaban Gerome Apley, MD [facility] Pager [phone]

## 2017-12-24 ENCOUNTER — Telehealth: Payer: Self-pay | Admitting: Internal Medicine

## 2017-12-24 DIAGNOSIS — E1121 Type 2 diabetes mellitus with diabetic nephropathy: Secondary | ICD-10-CM

## 2017-12-24 DIAGNOSIS — E1122 Type 2 diabetes mellitus with diabetic chronic kidney disease: Secondary | ICD-10-CM | POA: Diagnosis not present

## 2017-12-24 DIAGNOSIS — N170 Acute kidney failure with tubular necrosis: Secondary | ICD-10-CM | POA: Diagnosis not present

## 2017-12-24 DIAGNOSIS — M310 Hypersensitivity angiitis: Secondary | ICD-10-CM | POA: Diagnosis not present

## 2017-12-24 DIAGNOSIS — R188 Other ascites: Secondary | ICD-10-CM | POA: Diagnosis not present

## 2017-12-24 DIAGNOSIS — J9621 Acute and chronic respiratory failure with hypoxia: Secondary | ICD-10-CM | POA: Diagnosis not present

## 2017-12-24 DIAGNOSIS — J9601 Acute respiratory failure with hypoxia: Secondary | ICD-10-CM | POA: Diagnosis not present

## 2017-12-24 DIAGNOSIS — N184 Chronic kidney disease, stage 4 (severe): Secondary | ICD-10-CM | POA: Diagnosis not present

## 2017-12-24 DIAGNOSIS — K7460 Unspecified cirrhosis of liver: Secondary | ICD-10-CM | POA: Diagnosis not present

## 2017-12-24 DIAGNOSIS — I10 Essential (primary) hypertension: Secondary | ICD-10-CM | POA: Diagnosis not present

## 2017-12-24 DIAGNOSIS — A419 Sepsis, unspecified organism: Secondary | ICD-10-CM | POA: Diagnosis not present

## 2017-12-24 DIAGNOSIS — I129 Hypertensive chronic kidney disease with stage 1 through stage 4 chronic kidney disease, or unspecified chronic kidney disease: Secondary | ICD-10-CM | POA: Diagnosis not present

## 2017-12-24 DIAGNOSIS — J189 Pneumonia, unspecified organism: Secondary | ICD-10-CM | POA: Diagnosis not present

## 2017-12-24 LAB — RENAL FUNCTION PANEL
ANION GAP: 14 (ref 5–15)
Albumin: 2.7 g/dL — ABNORMAL LOW (ref 3.5–5.0)
BUN: 107 mg/dL — ABNORMAL HIGH (ref 6–20)
CHLORIDE: 99 mmol/L — AB (ref 101–111)
CO2: 24 mmol/L (ref 22–32)
Calcium: 6.5 mg/dL — ABNORMAL LOW (ref 8.9–10.3)
Creatinine, Ser: 4.64 mg/dL — ABNORMAL HIGH (ref 0.61–1.24)
GFR calc non Af Amer: 12 mL/min — ABNORMAL LOW (ref >60)
GFR, EST AFRICAN AMERICAN: 14 mL/min — AB (ref >60)
Glucose, Bld: 221 mg/dL — ABNORMAL HIGH (ref 65–99)
Phosphorus: 6.4 mg/dL — ABNORMAL HIGH (ref 2.5–4.6)
Potassium: 3.8 mmol/L (ref 3.5–5.1)
Sodium: 137 mmol/L (ref 135–145)

## 2017-12-24 LAB — CULTURE, BLOOD (ROUTINE X 2)
CULTURE: NO GROWTH
CULTURE: NO GROWTH
SPECIAL REQUESTS: ADEQUATE
Special Requests: ADEQUATE

## 2017-12-24 LAB — CBC
HEMATOCRIT: 25.9 % — AB (ref 39.0–52.0)
HEMOGLOBIN: 8.5 g/dL — AB (ref 13.0–17.0)
MCH: 25.4 pg — AB (ref 26.0–34.0)
MCHC: 32.8 g/dL (ref 30.0–36.0)
MCV: 77.3 fL — AB (ref 78.0–100.0)
Platelets: 43 10*3/uL — ABNORMAL LOW (ref 150–400)
RBC: 3.35 MIL/uL — AB (ref 4.22–5.81)
RDW: 17.6 % — ABNORMAL HIGH (ref 11.5–15.5)
WBC: 3.7 10*3/uL — ABNORMAL LOW (ref 4.0–10.5)

## 2017-12-24 LAB — GLUCOSE, CAPILLARY
GLUCOSE-CAPILLARY: 206 mg/dL — AB (ref 65–99)
Glucose-Capillary: 213 mg/dL — ABNORMAL HIGH (ref 65–99)
Glucose-Capillary: 223 mg/dL — ABNORMAL HIGH (ref 65–99)
Glucose-Capillary: 211 mg/dL — ABNORMAL HIGH (ref 65–99)
Glucose-Capillary: 148 mg/dL — ABNORMAL HIGH (ref 65–99)

## 2017-12-24 LAB — GLOMERULAR BASEMENT MEMBRANE ANTIBODIES: GBM Ab: 10 units (ref 0–20)

## 2017-12-24 LAB — PARATHYROID HORMONE, INTACT (NO CA): PTH: 123 pg/mL — ABNORMAL HIGH (ref 15–65)

## 2017-12-24 MED ORDER — CEFAZOLIN SODIUM-DEXTROSE 2-4 GM/100ML-% IV SOLN
2.0000 g | INTRAVENOUS | Status: AC
  Filled 2017-12-24: qty 100

## 2017-12-24 MED ORDER — FUROSEMIDE 40 MG PO TABS
40.0000 mg | ORAL_TABLET | Freq: Two times a day (BID) | ORAL | Status: DC
  Administered 2017-12-24 – 2017-12-25 (×3): 40 mg via ORAL
  Filled 2017-12-24 (×3): qty 1

## 2017-12-24 MED ORDER — INSULIN ASPART 100 UNIT/ML ~~LOC~~ SOLN
3.0000 [IU] | Freq: Three times a day (TID) | SUBCUTANEOUS | Status: DC
  Administered 2017-12-25 – 2017-12-26 (×2): 3 [IU] via SUBCUTANEOUS

## 2017-12-24 NOTE — Progress Notes (Signed)
PROGRESS NOTE    William [name]  XIP:[id number] DOB: [birth year] DOA: 12/19/2017 PCP: Gildardo Pounds, NP    Brief Narrative:  67 year old male who presented with dyspnea and cough. He does have significant past medical history forGoodpasturesyndrome, pancytopenia, cirrhosis,chronic kidney disease stage IV, type 2 diabetes mellitus and heart failure with preserved ejection fraction.Patient had recent hospitalizations due to renal failure and decompensated liver disease. On day of admission apparently patient had dyspnea and fevers for 24 hours. On initial physical examination blood pressure 157/76, heart rate 94, respiratory 24, oxygen saturation 95%.Moist mucous membranes, lungs scattered rhonchi bilaterally, decreased breath sounds at bases, possibly rales, poor respiratory effort, heart S1-S2 present and rhythmic, no gallops or gallops, positive systolic murmur, abdomen severely distended but nontender, positive pitting lower extremity edema bilaterally.Sodium 141, potassium 5.4, chloride 104, bicarbonate 25, glucose 64, BUN 81, creatinine 4.16, white count 7.3, 1110.9, hematocrit 33.8,platelets 89.Chest x-ray with alveolar infiltrates in the right upper and lower lobe, CT chest with bilateral centralupper lobesalveolar infiltrates, bibasilar atelectasis.  Patient was admitted to the hospital working diagnosis of acute hypoxic respiratory failure, torule out healthcare associatedpneumonia.  Assessment & Plan:   Active Problems:   Type II diabetes mellitus with renal manifestations (HCC)   Acute respiratory failure with hypoxia (HCC)   Pancytopenia (HCC)   Essential hypertension   Goodpasture's syndrome (HCC) with associated hemoptysis   Cirrhosis of liver with ascites (HCC)   CKD (chronic kidney disease) stage 4, GFR 15-29 ml/min (HCC)   Hyperkalemia   HCAP (healthcare-associated pneumonia)   Ascites  1.Acuteon chronichypoxic respiratory failure. Stable infiltrates noted,  continue bronchodilator therapy, oxymetry monitoring and supplemental oxygen per nasal cannula as tolerated.Marland Kitchen   Antibiotics remain on hold. Hb has remained stable, no clinical sings of alveolar hemorrhage.  Patient was recently plan for outpatient bronchoscopy pending improvement of thrombocytopenia.  Discussed case with nephrology who recommends formal pulmonary consultation.  Have since discussed case with Dr. Lake Bells will see patient in consultation.Marland Kitchen   2.Chronic kidney disease stage IV.   Discussed case with nephrology. 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 Patient scheduled for HD access (fistula) placement on this admission, postponed until Friday (12/26/2017).  Recently decreased prednisone to 20 mg daily. Continue phoslo, and darbapoetin. Hold on sodium bicarbonate.    3.Chronic liver failure.   Patient alert and oriented, not encephalopathic. No clinically significant ascites.   4.Type 2 diabetes mellitus with uncontrolled hyperglyecmia.   Patient noted to have poorly controlled sugars recently which prompted delay of dialysis access placement.  Have since increased basal insulin to 10 units bid and will decreased steroid dose as per above.  Glucose now in the low 200s.  We will continue to titrate insulin accordingly  5.Hypertension. Continue with amlodipine and carvedilol,for blood pressure control.  Stable at present   6. Thrombocytopenia. Stable thrombocytopenia.  No signs of active bleeding.  Suspicion for reactive thrombocytopenia in the setting of liver disease.   7. Goodpasture syndrome.   Patient noted to have low positive anti GBM, ANCA negative. Patient is sp pulse steroids and cytoxan. Renal biopsy did not confirm GPS. Will continue supportive medical therapy.  Currently weaning steroids.    8. Giardia induced diarrhea. On PO metronidazole,  diarrhea is improving, out of bed as tolerated and physical therapy evaluation.    DVT prophylaxis: SCDs Code Status: Full code Family Communication: Patient in room, family at bedside Disposition Plan: Uncertain at this  time  Consultants:   Pulmonary  Nephrology  Vascular surgery  Procedures:     Antimicrobials: Anti-infectives (From admission, onward)   Start     Dose/Rate Route Frequency Ordered Stop   12/24/17 0845  ceFAZolin (ANCEF) IVPB 2g/100 mL premix    Note to Pharmacy:  Send with pt to OR   2 g 200 mL/hr over 30 Minutes Intravenous To [facility]Stay [facility] 12/24/17 0821 12/25/17 0845   12/23/17 2200  metroNIDAZOLE (FLAGYL) tablet 500 mg     500 mg Oral Every 12 hours 12/23/17 1557     12/23/17 0915  metroNIDAZOLE (FLAGYL) tablet 500 mg  Status:  Discontinued     500 mg Oral Every 8 hours 12/23/17 0914 12/23/17 1557   12/21/17 2200  vancomycin (VANCOCIN) IVPB 1000 mg/200 mL premix  Status:  Discontinued     1,000 mg 200 mL/hr over 60 Minutes Intravenous Every 48 hours 12/19/17 2211 12/21/17 0901   12/21/17 1400  ceFEPIme (MAXIPIME) 1 g in sodium chloride 0.9 % 100 mL IVPB  Status:  Discontinued     1 g 200 mL/hr over 30 Minutes Intravenous Every 24 hours 12/21/17 0901 12/22/17 1017   12/21/17 1030  metroNIDAZOLE (FLAGYL) IVPB 500 mg  Status:  Discontinued     500 mg 100 mL/hr over 60 Minutes Intravenous Every 8 hours 12/21/17 0942 12/23/17 0914   12/19/17 2215  piperacillin-tazobactam (ZOSYN) IVPB 2.25 g  Status:  Discontinued     2.25 g 100 mL/hr over 30 Minutes Intravenous Every 8 hours 12/19/17 2211 12/21/17 0901   12/19/17 1445  vancomycin (VANCOCIN) 1,500 mg in sodium chloride 0.9 % 500 mL IVPB     1,500 mg 250 mL/hr over 120 Minutes Intravenous  Once 12/19/17 1428 12/19/17 1843   12/19/17 1415  piperacillin-tazobactam (ZOSYN) IVPB 3.375 g     3.375 g 100 mL/hr over 30 Minutes Intravenous  Once 12/19/17 1410 12/19/17 1713   12/19/17 1415  vancomycin  (VANCOCIN) IVPB 1000 mg/200 mL premix  Status:  Discontinued     1,000 mg 200 mL/hr over 60 Minutes Intravenous  Once 12/19/17 1410 12/19/17 1428       Subjective: No complaints at this time  Objective: Vitals:   12/24/17 0302 12/24/17 0500 12/24/17 0810 12/24/17 1138  BP: (!) 164/72  (!) 160/69 (!) 142/74  Pulse: 75  80 78  Resp: 18  17 17   Temp: (!) 97.4 F (36.3 C)  98.3 F (36.8 C) 98.4 F (36.9 C)  TempSrc: Oral  Oral Oral  SpO2: 98%  95% 95%  Weight:  75.3 kg (166 lb 0.1 oz)    Height:        Intake/Output Summary (Last 24 hours) at 12/24/2017 1636 Last data filed at 12/24/2017 1400 Gross per 24 hour  Intake 1020 ml  Output -  Net 1020 ml   Filed Weights   12/22/17 0404 12/23/17 0500 12/24/17 0500  Weight: 70.2 kg (154 lb 12.2 oz) 74.3 kg (163 lb 12.8 oz) 75.3 kg (166 lb 0.1 oz)    Examination:  General exam: Appears calm and comfortable  Respiratory system: Clear to auscultation. Respiratory effort normal. Cardiovascular system: S1 & S2 heard, RRR. No JVD Gastrointestinal system: Abdomen is nondistended, soft and nontender. No organomegaly or masses felt. Normal bowel sounds heard. Central nervous system: Alert and oriented. No focal neurological deficits. Extremities: Symmetric 5 x 5 power. Skin: No rashes, lesions Psychiatry: Judgement and insight appear normal. Mood & affect appropriate.   Data Reviewed:  I have personally reviewed following labs and imaging studies  CBC: Recent Labs  Lab 12/19/17 1414  12/20/17 0458 12/21/17 0556 12/22/17 0324 12/23/17 0232 12/24/17 0254  WBC 7.3   < > 5.2 2.5* 3.4* 3.5* 3.7*  NEUTROABS 5.0  --   --   --   --   --   --   HGB 10.9*   < > 9.8* 8.5* 7.9* 7.8* 8.5*  HCT 33.8*   < > 31.1* 26.6* 24.3* 23.9* 25.9*  MCV 78.4   < > 79.7 80.4 77.6* 77.9* 77.3*  PLT 89*   < > 81* 43* 49* 46* 43*   < > = values in this interval not displayed.   Basic Metabolic Panel: Recent Labs  Lab 12/20/17 0458 12/21/17 0330  12/22/17 0324 12/23/17 0232 12/24/17 0254  NA 141 142 139 137 137  K 5.3* 5.9* 4.0 3.8 3.8  CL 104 103 98* 99* 99*  CO2 22 20* 23 24 24   GLUCOSE 128* 165* 506* 367* 221*  BUN 77* 83* 92* 93* 107*  CREATININE 3.99* 4.92* 4.81* 4.52* 4.64*  CALCIUM 6.5* 6.6* 6.4* 5.9* 6.5*  PHOS  --   --   --  7.0* 6.4*   GFR: Estimated Creatinine Clearance: 14.8 mL/min (A) (by C-G formula based on SCr of 4.64 mg/dL (H)). Liver Function Tests: Recent Labs  Lab 12/19/17 1414 12/21/17 0330 12/22/17 0324 12/23/17 0232 12/24/17 0254  AST 35 36 25  --   --   ALT 27 21 22   --   --   ALKPHOS 75 60 61  --   --   BILITOT 1.9* 2.9* 1.8*  --   --   PROT 6.1* 5.2* 5.3*  --   --   ALBUMIN 3.4* 2.7* 2.6* 2.5* 2.7*   Recent Labs  Lab 12/19/17 1414  LIPASE 25   No results for input(s): AMMONIA in the last 168 hours. Coagulation Profile: No results for input(s): INR, PROTIME in the last 168 hours. Cardiac Enzymes: No results for input(s): CKTOTAL, CKMB, CKMBINDEX, TROPONINI in the last 168 hours. BNP (last 3 results) Recent Labs    09/25/17 1614  PROBNP 380.0*   HbA1C: No results for input(s): HGBA1C in the last 72 hours. CBG: Recent Labs  Lab 12/23/17 2150 12/24/17 0618 12/24/17 0808 12/24/17 1136 12/24/17 1557  GLUCAP 178* 213* 206* 223* 211*   Lipid Profile: No results for input(s): CHOL, HDL, LDLCALC, TRIG, CHOLHDL, LDLDIRECT in the last 72 hours. Thyroid Function Tests: No results for input(s): TSH, T4TOTAL, FREET4, T3FREE, THYROIDAB in the last 72 hours. Anemia Panel: Recent Labs    12/23/17 0232  TIBC 153*  IRON 119   Sepsis Labs: Recent Labs  Lab 12/19/17 1421  LATICACIDVEN 1.07    Recent Results (from the past 240 hour(s))  Blood Culture (routine x 2)     Status: None   Collection Time: 12/19/17  2:10 PM  Result Value Ref Range Status   Specimen Description BLOOD RIGHT ANTECUBITAL  Final   Special Requests AEROBIC BOTTLE ONLY Blood Culture adequate volume  Final    Culture   Final    NO GROWTH 5 DAYS Performed at [facility], [place] [address] [place], [place] [address]    Report Status 12/24/2017 FINAL  Final  Blood Culture (routine x 2)     Status: None   Collection Time: 12/19/17  4:32 PM  Result Value Ref Range Status   Specimen Description BLOOD BLOOD RIGHT HAND  Final   Special Requests   Final    BOTTLES DRAWN AEROBIC AND ANAEROBIC Blood Culture adequate volume   Culture   Final    NO GROWTH 5 DAYS Performed at [facility], [address], [place], [place] [address]    Report Status 12/24/2017 FINAL  Final  Gastrointestinal Panel by PCR , Stool     Status: Abnormal   Collection Time: 12/19/17  6:47 PM  Result Value Ref Range Status   Campylobacter species NOT DETECTED NOT DETECTED Final   Plesimonas shigelloides NOT DETECTED NOT DETECTED Final   Salmonella species NOT DETECTED NOT DETECTED Final   Yersinia enterocolitica NOT DETECTED NOT DETECTED Final   Vibrio species NOT DETECTED NOT DETECTED Final   Vibrio cholerae NOT DETECTED NOT DETECTED Final   Enteroaggregative E coli (EAEC) NOT DETECTED NOT DETECTED Final   Enteropathogenic E coli (EPEC) NOT DETECTED NOT DETECTED Final   Enterotoxigenic E coli (ETEC) NOT DETECTED NOT DETECTED Final   Shiga like toxin producing E coli (STEC) NOT DETECTED NOT DETECTED Final   E. coli O157 NOT DETECTED NOT DETECTED Final   Shigella/Enteroinvasive E coli (EIEC) NOT DETECTED NOT DETECTED Final   Cryptosporidium NOT DETECTED NOT DETECTED Final   Cyclospora cayetanensis NOT DETECTED NOT DETECTED Final   Entamoeba histolytica NOT DETECTED NOT DETECTED Final   Giardia lamblia DETECTED (A) NOT DETECTED Final   Adenovirus F40/41 NOT DETECTED NOT DETECTED Final   Astrovirus NOT DETECTED NOT DETECTED Final   Norovirus GI/GII NOT DETECTED NOT DETECTED Final   Rotavirus A NOT DETECTED NOT DETECTED Final   Sapovirus (I, II, IV, and V) NOT DETECTED NOT DETECTED Final    Comment: Performed  at [facility], [address] [place], [place] [address]  C difficile quick scan w PCR reflex     Status: None   Collection Time: 12/19/17  6:47 PM  Result Value Ref Range Status   C Diff antigen NEGATIVE NEGATIVE Final   C Diff toxin NEGATIVE NEGATIVE Final   C Diff interpretation No C. difficile detected.  Final    Comment: Performed at [facility], [place] [address] [place], [place] [address]  Respiratory Panel by PCR     Status: None   Collection Time: 12/19/17 11:16 PM  Result Value Ref Range Status   Adenovirus NOT DETECTED NOT DETECTED Final   Coronavirus 229E NOT DETECTED NOT DETECTED Final   Coronavirus HKU1 NOT DETECTED NOT DETECTED Final   Coronavirus NL63 NOT DETECTED NOT DETECTED Final   Coronavirus OC43 NOT DETECTED NOT DETECTED Final   Metapneumovirus NOT DETECTED NOT DETECTED Final   Rhinovirus / Enterovirus NOT DETECTED NOT DETECTED Final   Influenza A NOT DETECTED NOT DETECTED Final   Influenza B NOT DETECTED NOT DETECTED Final   Parainfluenza Virus 1 NOT DETECTED NOT DETECTED Final   Parainfluenza Virus 2 NOT DETECTED NOT DETECTED Final   Parainfluenza Virus 3 NOT DETECTED NOT DETECTED Final   Parainfluenza Virus 4 NOT DETECTED NOT DETECTED Final   Respiratory Syncytial Virus NOT DETECTED NOT DETECTED Final   Bordetella pertussis NOT DETECTED NOT DETECTED Final   Chlamydophila pneumoniae NOT DETECTED NOT DETECTED Final   Mycoplasma pneumoniae NOT DETECTED NOT DETECTED Final  MRSA PCR Screening     Status: None   Collection Time: 12/20/17  7:51 PM  Result Value Ref Range Status   MRSA by PCR NEGATIVE NEGATIVE Final    Comment:  The GeneXpert MRSA Assay (FDA approved for NASAL specimens only), is one component of a comprehensive MRSA colonization surveillance program. It is not intended to diagnose MRSA infection nor to guide or monitor treatment for MRSA infections. Performed at [facility], [place] [address] [place],  [place] [address]      Radiology Studies: No results found.  Scheduled Meds: . aminocaproic acid  500 mg Oral Q8H  . amLODipine  10 mg Oral Daily  . calcium acetate  1,334 mg Oral TID WC  . carvedilol  12.5 mg Oral BID WC  . darbepoetin (ARANESP) injection - NON-DIALYSIS  200 mcg Subcutaneous Q Mon-1800  . feeding supplement (PRO-STAT SUGAR FREE 64)  30 mL Oral BID  . ferrous gluconate  324 mg Oral Q breakfast  . furosemide  40 mg Oral BID  . insulin aspart  0-15 Units Subcutaneous TID WC  . insulin detemir  10 Units Subcutaneous BID  . metroNIDAZOLE  500 mg Oral Q12H  . predniSONE  20 mg Oral Q breakfast  . saccharomyces boulardii  250 mg Oral BID   Continuous Infusions: .  ceFAZolin (ANCEF) IV       LOS: 5 days   Marylu Lund, MD [facility] Pager [phone]  If 7PM-7AM, please contact night-coverage [email or web address] Password [facility] 12/24/2017, 4:36 PM

## 2017-12-24 NOTE — Telephone Encounter (Signed)
Called pt w/ sch change from 6/11 to 6/5 @ 10:45 am.  Calendar mailed.

## 2017-12-24 NOTE — Consult Note (Signed)
Name: William [name] MRN: [id number] DOB: Jan 12, 1951    ADMISSION DATE:  12/19/2017 CONSULTATION DATE:  5/22  REFERRING MD :  Lorrene Reid (renal)   CHIEF COMPLAINT:  Cough, dyspnea  BRIEF PATIENT DESCRIPTION: 67yo [place] male with hx DM, CKD, cirrhosis/ascites (NO hx ETOH), CHF, dCHF, HTN who presented 5/17 with cough, fever, dyspnea, diarrhea ([place] POS).  He was admitted twice in April 2019 with AKI on CKD, hemoptysis, pancytopenia and BLE edema thought to all be r/t Goodpasture syndrome treated with high dose steroids, cytoxan (stopped d/t pancytopenia), PLEX.  Renal biopsy showing acute glomerulonephritis with FSGS but not enough specimen to run immunofluorescence for GBM.   He also required temporary dialysis at that time.  Had second admission in April again for hemoptysis and treated with PLEX, FFP, IV steroids.   Initial plans for FOB with hx during that admission were deferred r/t severe thrombocytopenia.    He returned 5/17 with cough, SOB, hypoemia.  CT chest still with bilateral infiltrates and despite diuresis and empiric abx for ?PNA.   Scr has continued to rise with diuretics and renal consulted.  Now some ?as to whether pt has or actually had renal GN.     PCCM consulted to assist and potentially consider FOB.    SIGNIFICANT EVENTS    STUDIES:  CT chest/abd/pelvis 5/17>>>1. Diffuse bilateral airspace opacities likely representing pulmonary edema. Infection/superimposed infection not excluded. 2. Moderate to large amount of ascites again noted with small bilateral pleural effusions and diffuse subcutaneous edema. 3. Cirrhosis and splenomegaly. 4. Cardiomegaly and small pericardial effusion. 5. Coronary artery and Aortic Atherosclerosis (ICD10-I70.0). 6. Prostatomegaly.   RVP 5/17>>> neg  HIV 09/2017>> NR  ANCA 11/07/17>>> neg  ANA 09/2017>> neg  CCP 11/07/17>> wnl  GBM Ab 4/5>>> 29>>6>>5>>10   C-ANCA 11/07/17>>> neg  P-ANCA 11/07/17>> neg  C3, C4>> wnl    HISTORY OF  PRESENT ILLNESS:  67yo [place] male with hx DM, CKD, cirrhosis/ascites (NO hx ETOH), CHF, dCHF, HTN who presented 5/17 with cough, fever, dyspnea, diarrhea (Giardia POS).  He was admitted twice in April 2019 with AKI on CKD, hemoptysis, pancytopenia and BLE edema thought to all be r/t Goodpasture syndrome treated with high dose steroids, cytoxan (stopped d/t pancytopenia), PLEX.  Renal biopsy showing acute glomerulonephritis with FSGS but not enough specimen to run immunofluorescence for GBM.   He also required temporary dialysis at that time.  Had second admission in April again for hemoptysis and treated with PLEX, FFP, IV steroids.   Initial plans for FOB with hx during that admission were deferred r/t severe thrombocytopenia.    He returned 5/17 with cough, SOB, hypoemia.  CT chest still with bilateral infiltrates and despite diuresis and empiric abx for ?PNA.   Scr has continued to rise with diuretics and renal consulted.  Now some ?as to whether pt has or actually had renal GN.     PCCM consulted to assist and potentially consider FOB.    Currently feeling much better.  Family at bedside translating.  No further hemoptysis.  Denies dyspnea, chest pain, edema.   PAST MEDICAL HISTORY :   has a past medical history of Anemia, CHF (congestive heart failure) ([place]), Cirrhosis ([place]), Cirrhosis ([place]), CKD (chronic kidney disease), stage IV ([place]), Goodpasture's disease ([place]), Grade I diastolic dysfunction (57/90/3833), History of anemia due to chronic kidney disease, History of blood transfusion (X 1), History of plasmapheresis, Hypertension, Pancytopenia ([place]) (08/20/2017), and Type II diabetes mellitus ([place]).  has a past surgical history that  includes Neck surgery (2007); IR Fluoro Guide CV Line Right (11/10/2017); IR US Guide Vasc Access Right (11/10/2017); Cataract extraction w/ intraocular lens  implant, bilateral (Bilateral); IR Removal Tun Cv Cath W/O FL (11/28/2017); and IR Paracentesis (12/10/2017). Prior  to Admission medications   Medication Sig Start Date End Date Taking? Authorizing Provider  acetaminophen (TYLENOL) 650 MG CR tablet Take 650 mg by mouth every 8 (eight) hours as needed for pain.   Yes [provider]  aminocaproic acid (AMICAR) 500 MG tablet Take 1 tablet (500 mg total) by mouth every 8 (eight) hours. 12/15/17 01/14/18 Yes Curt Bears, MD  amLODipine (NORVASC) 10 MG tablet Take 1 tablet (10 mg total) daily by mouth. 06/20/17 11/06/24 Yes Gildardo Pounds, NP  carvedilol (COREG) 12.5 MG tablet Take 12.5 mg by mouth 2 (two) times daily with a meal.   Yes [provider]  ferrous gluconate (IRON 27) 240 (27 FE) MG tablet Take 240 mg by mouth daily.    Yes [provider]  furosemide (LASIX) 40 MG tablet Take 1 tablet (40 mg total) by mouth 2 (two) times daily. 11/18/17 11/18/18 Yes Regalado, Belkys A, MD  insulin NPH-regular Human (NOVOLIN 70/30) (70-30) 100 UNIT/ML injection Inject 18 Units into the skin 2 (two) times daily with a meal. 11/18/17  Yes Regalado, Belkys A, MD  Multiple Vitamins-Minerals (MULTIVITAMIN WITH MINERALS) tablet Take 1 tablet by mouth daily.   Yes [provider]  predniSONE (DELTASONE) 20 MG tablet Take 2 tablets (40 mg total) by mouth daily with breakfast. Patient taking differently: Take 40 mg by mouth daily with lunch.  11/30/17  Yes Dessa Phi, DO  sodium bicarbonate 650 MG tablet Take 2 tablets (1,300 mg total) by mouth 2 (two) times daily. 11/18/17  Yes Regalado, Belkys A, MD  spironolactone (ALDACTONE) 50 MG tablet Take 50 mg by mouth daily. 12/09/17  Yes [provider]  dextromethorphan-guaiFENesin (MUCINEX DM) 30-600 MG 12hr tablet Take 1 tablet by mouth 2 (two) times daily as needed for cough. Patient not taking: Reported on 12/15/2017 11/18/17   Elmarie Shiley, MD   Allergies  Allergen Reactions  . Heparin     Patient is Muslim and is not permitted Pork derative due to religious belief)    FAMILY  HISTORY:  family history includes Diabetes Mellitus II in his sister; Stroke in his brother. SOCIAL HISTORY:  reports that he has never smoked. He has never used smokeless tobacco. He reports that he does not drink alcohol or use drugs.  REVIEW OF SYSTEMS:   As per HPI - All other systems reviewed and were neg.    SUBJECTIVE:   VITAL SIGNS: Temp:  [97.4 F (36.3 C)-98.4 F (36.9 C)] 98.4 F (36.9 C) (05/22 1138) Pulse Rate:  [75-84] 78 (05/22 1138) Resp:  [16-18] 17 (05/22 1138) BP: (142-164)/(69-84) 142/74 (05/22 1138) SpO2:  [92 %-98 %] 95 % (05/22 1138) Weight:  [75.3 kg (166 lb 0.1 oz)] 75.3 kg (166 lb 0.1 oz) (05/22 0500)  PHYSICAL EXAMINATION: General:  Pleasant, non english speaking male, NAD on RA  Neuro:  Awake, alert, speaks arabic, family translating, MAE, non focal  HEENT:  Mm moist, no JVD  Cardiovascular:  s1s2 rrr Lungs:  resps even non labored on RA, few scattered crackles but otherwise ess clear  Abdomen:  Round, soft, +bs  Musculoskeletal:  Warm and dry, no edema   Recent Labs  Lab 12/22/17 0324 12/23/17 0232 12/24/17 0254  NA 139 137 137  K 4.0 3.8 3.8  CL 98* 99* 99*  CO2 23 24 24   BUN 92* 93* 107*  CREATININE 4.81* 4.52* 4.64*  GLUCOSE 506* 367* 221*   Recent Labs  Lab 12/22/17 0324 12/23/17 0232 12/24/17 0254  HGB 7.9* 7.8* 8.5*  HCT 24.3* 23.9* 25.9*  WBC 3.4* 3.5* 3.7*  PLT 49* 46* 43*   No results found.  ASSESSMENT / PLAN:  Acute hypoxic respiratory failure - improved  Pulmonary infiltrates  Hemoptysis - resolved.  ?HCAP   Concern for ANCA vasculitis although per renal no acute GN on renal bx. Hemoptysis is improved, respiratory status stable.  Should consider FOB with transbronchial bx but thrombocytopenia remains an issue (plt 43) so will likely need to hold off.  Continue empiric abx for now. On moderate dose PO steroids.  Restarting gentle diuresis per renal.   Dr. [place] Bells to follow.   Nickolas Madrid, NP 12/24/2017   2:28 PM Pager: [phone] or [phone]

## 2017-12-24 NOTE — Progress Notes (Signed)
[facility] Rounding Note  Background: 67 y.o. year-old hx of cirrhosis/ascites, DM2,  admitted April 2019 AKI on CKD, hemoptysis and lower ext edema.  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 Renal biopsy 11/17/17 ->diabetic GN w/ secondary FSGS and not enough glomeruli for  IF staining for anti-GBM Ab's (but showed no evidence of active GN).  Had temporary dialysis but renal function improved and was dc'd off dialysis. Dc'd on 4/16, readmitted 4/18 w/hemoptysis, had PLEX w/ FFP, re-pulsed with IV steroids. Creat peaked at 5.4 down to 3.78 on 4/27.  Seen by oncology 5/13 Dr Julien Nordmann for pancytopenia w/rec to continue Amicar but no need for continued Nplate.  Presented again  5/17 with c/o SOB, cough, hypoxemia.  CT chest showed findings suggesting bilat infiltrates possilbe edema. Rec'd high dose IV lasix.  He had abd distension but known hx of cirrhosis with ascites. He was started on IV abx as well for possible PNA. Diuresed net of about 3L.  Creat was 3.6 on admission, 3.9 5/18 and and 4.92 5/19 day of consult by Dr. Jonnie Finner.   Date            Creat summary               08/2015         1.23 06/2017       3.01 08/2017         3.54 09/2017         3.53 11/2017       3.6- 5.7  Assessment/Recommendations  1. CKD stage IV/V - no biopsy evidence that patient actually has/had renal GN.  Biopsy was negative for active GN even though there was not enough tissue to do IF for GBM staining.  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 Kidneys bilaterally small and echodense. Probably looking at long term effects HTN/DM - not active GN. Creat bumped ^ with diuresis x 2 days, another point arguing against simple vol overload. Renal fx sl better (creatinine down a hair) today  The only thing we have  going on for Goodpasture's is a low titer anti-GBM x 1 Repeating the anti  GBM  (pending) 1. Continue to hold lasix   2. [facility] (Dr. Donzetta Matters) will place AVF - postponed yesterday 2/2 poor BS control to be rescheduled 3. I don't think needs catheter just yet, but is not far away from the need for long term dialysis.   2. Pulmonary infiltrates with prior hemoptysis - Pulm infiltrates are extensive and have basically never really subsided upon reviewing the last 6 months of CXR's and CT's. Clinically not significantly vol overloaded to account for the imaging changes. Needs for pulm to see. Please consult pulmonary for persistent lung symptoms and imaging changes.  3.  Cirrhosis w ascites - known 4.  DM2 insulin requiring 5.  HTN on ccb/ coreg and diuretics at home.  6.  Volume - Diuretics have been on hold. No edema, weight is up some, maybe few more crackles R side. Will resume 40 BID (which for current GFR not a large dose at all) 7.  HCAP - getting cefipime.  8.  Acute hypoxic resp failure - suspect more rel to infection, less so to volume 9.  Diarrhea w/+ giardia, on po flagyl 10. dCHF - vol steady for now 11 Anemia - part of cytopenias. Iron studies are fine.  Added weekly Aranesp, dosed with 200 on 5/20 12 CKD-MBD - PTH still pending. Added ca acetate 5/21 for low ca and hyperphosphatemia  Jamal Maes, MD [place] Pager 12/24/2017, 7:30 AM  Subjective/Interval History: AVF postponed 2/2 elev BS so to be done later in the week BS good this AM Son says Dad had a good night No CP, SOB or hemoptysis Appetite remains good  Objective Vital signs in last 24 hours: Vitals:   12/23/17 2152 12/23/17 2318 12/24/17 0302 12/24/17 0500  BP: (!) 150/81 (!) 160/84 (!) 164/72   Pulse: 84 84 75   Resp:  18 18   Temp: 97.9 F (36.6 C) 97.8 F (36.6 C) (!) 97.4 F (36.3 C)   TempSrc: Oral Oral Oral   SpO2: 92% 97% 98%   Weight:    75.3 kg (166 lb 0.1 oz)  Height:       Weight change: 1 kg (2 lb 3.3 oz)  Intake/Output Summary (Last 24  hours) at 12/24/2017 0709 Last data filed at 12/23/2017 2100 Gross per 24 hour  Intake 770 ml  Output -  Net 770 ml   Physical Exam:  Blood pressure (!) 164/72, pulse 75, temperature (!) 97.4 F (36.3 C), temperature source Oral, resp. rate 18, height 5' 5"  (1.651 m), weight 75.3 kg (166 lb 0.1 oz), SpO2 98 %.   Alert, no distress, calm and pleasant Non English speaking Smiling, O2 on, no ^ WOB No JVD Chest clear ant, ? Few R base crackles Regular rhythm S1S2 normal. No S3 Abd soft, not tender, moderate ascites  No LE edema No asterixus Awake, alert, smiling   Recent Labs  Lab 12/19/17 1414 12/19/17 1421 12/19/17 1938 12/20/17 0458 12/21/17 0330 12/22/17 0324 12/23/17 0232 12/24/17 0254  NA 141 140 140 141 142 139 137 137  K 5.4* 5.2* 4.9 5.3* 5.9* 4.0 3.8 3.8  CL 104 103 105 104 103 98* 99* 99*  CO2 25  --   --  22 20* 23 24 24   GLUCOSE 64* 60* 110* 128* 165* 506* 367* 221*  BUN 81* 64* 63* 77* 83* 92* 93* 107*  CREATININE 4.16* 3.60* 3.40* 3.99* 4.92* 4.81* 4.52* 4.64*  CALCIUM 7.0*  --   --  6.5* 6.6* 6.4* 5.9* 6.5*  PHOS  --   --   --   --   --   --  7.0* 6.4*    Recent Labs  Lab 12/19/17 1414 12/21/17 0330 12/22/17 0324 12/23/17 0232 12/24/17 0254  AST 35 36 25  --   --   ALT 27 21 22   --   --   ALKPHOS 75 60 61  --   --   BILITOT 1.9* 2.9* 1.8*  --   --   PROT 6.1* 5.2* 5.3*  --   --   ALBUMIN 3.4* 2.7* 2.6* 2.5* 2.7*   Recent Labs  Lab 12/19/17 1414  LIPASE 25    Recent Labs  Lab 12/19/17 1414  12/21/17 0556 12/22/17 0324 12/23/17 0232 12/24/17 0254  WBC 7.3   < > 2.5* 3.4* 3.5* 3.7*  NEUTROABS 5.0  --   --   --   --   --   HGB 10.9*   < > 8.5* 7.9* 7.8* 8.5*  HCT 33.8*   < > 26.6* 24.3* 23.9* 25.9*  MCV 78.4   < > 80.4 77.6* 77.9* 77.3*  PLT 89*   < > 43* 49* 46* 43*   < > =  values in this interval not displayed.    Recent Labs  Lab 12/23/17 0918 12/23/17 1108 12/23/17 1717 12/23/17 2150 12/24/17 0618  GLUCAP 410* 347* 180*  178* 213*   Renal biopsy: 11/17/17 DIABETIC GLOMERULOPATHY IN ASSOCIATION WITH MODERATE TO SEVERE ARTERIONEPHROSCLEROSIS AND SECONDARY FOCAL AND SEGMENTAL GLOMERUSCLEROSIS. ACUTE TUBULAR INJURY. NO EVIDENCE OF AN ACTIVE GLOMERULONEPHRITIS.  Studies/Results: Dg Chest 2 View  Result Date: 12/22/2017 CLINICAL DATA:  Shortness of breath EXAM: CHEST - 2 VIEW COMPARISON:  Chest radiograph and chest CT Dec 19, 2017 FINDINGS: There is patchy interstitial and alveolar edema throughout the lungs, essentially stable compared to the recent prior study. Heart is enlarged with pulmonary venous hypertension. No adenopathy. There is aortic atherosclerosis. No bone lesions. IMPRESSION: Pulmonary vascular congestion with interstitial and alveolar edema. Likely congestive heart failure. A degree of pneumonia and/or ARDS superimposed cannot be excluded radiographically. Appearance stable compared to recent prior studies. Electronically Signed   By: Lowella Grip III M.D.   On: 12/22/2017 09:31   Medications:  . aminocaproic acid  500 mg Oral Q8H  . amLODipine  10 mg Oral Daily  . calcium acetate  1,334 mg Oral TID WC  . carvedilol  12.5 mg Oral BID WC  . darbepoetin (ARANESP) injection - NON-DIALYSIS  200 mcg Subcutaneous Q Mon-1800  . feeding supplement (PRO-STAT SUGAR FREE 64)  30 mL Oral BID  . ferrous gluconate  324 mg Oral Q breakfast  . insulin aspart  0-15 Units Subcutaneous TID WC  . insulin detemir  10 Units Subcutaneous BID  . metroNIDAZOLE  500 mg Oral Q12H  . predniSONE  20 mg Oral Q breakfast  . saccharomyces boulardii  250 mg Oral BID

## 2017-12-24 NOTE — Progress Notes (Signed)
   He is scheduled for left arm AV fistula versus graft with Dr. Bridgett Larsson on Friday.  We will preop tomorrow for procedure needs to be n.p.o. past midnight Thursday.  Donni Oglesby C. Donzetta Matters, MD [facility] Office: [phone] Pager: [phone]

## 2017-12-24 NOTE — H&P (View-Only) (Signed)
   He is scheduled for left arm AV fistula versus graft with Dr. Bridgett Larsson on Friday.  We will preop tomorrow for procedure needs to be n.p.o. past midnight Thursday.  Thamara Leger C. Donzetta Matters, MD [facility] Office: [phone] Pager: [phone]

## 2017-12-25 ENCOUNTER — Inpatient Hospital Stay (HOSPITAL_COMMUNITY): Payer: Medicare Other

## 2017-12-25 DIAGNOSIS — J9621 Acute and chronic respiratory failure with hypoxia: Secondary | ICD-10-CM | POA: Diagnosis not present

## 2017-12-25 DIAGNOSIS — J189 Pneumonia, unspecified organism: Secondary | ICD-10-CM | POA: Diagnosis not present

## 2017-12-25 DIAGNOSIS — A419 Sepsis, unspecified organism: Secondary | ICD-10-CM | POA: Diagnosis not present

## 2017-12-25 DIAGNOSIS — J9601 Acute respiratory failure with hypoxia: Secondary | ICD-10-CM | POA: Diagnosis not present

## 2017-12-25 DIAGNOSIS — Z794 Long term (current) use of insulin: Secondary | ICD-10-CM | POA: Diagnosis not present

## 2017-12-25 DIAGNOSIS — R9389 Abnormal findings on diagnostic imaging of other specified body structures: Secondary | ICD-10-CM

## 2017-12-25 DIAGNOSIS — E1121 Type 2 diabetes mellitus with diabetic nephropathy: Secondary | ICD-10-CM | POA: Diagnosis not present

## 2017-12-25 DIAGNOSIS — N184 Chronic kidney disease, stage 4 (severe): Secondary | ICD-10-CM | POA: Diagnosis not present

## 2017-12-25 DIAGNOSIS — I129 Hypertensive chronic kidney disease with stage 1 through stage 4 chronic kidney disease, or unspecified chronic kidney disease: Secondary | ICD-10-CM | POA: Diagnosis not present

## 2017-12-25 DIAGNOSIS — I10 Essential (primary) hypertension: Secondary | ICD-10-CM | POA: Diagnosis not present

## 2017-12-25 DIAGNOSIS — R188 Other ascites: Secondary | ICD-10-CM | POA: Diagnosis not present

## 2017-12-25 DIAGNOSIS — M310 Hypersensitivity angiitis: Secondary | ICD-10-CM | POA: Diagnosis not present

## 2017-12-25 DIAGNOSIS — N170 Acute kidney failure with tubular necrosis: Secondary | ICD-10-CM | POA: Diagnosis not present

## 2017-12-25 DIAGNOSIS — E1122 Type 2 diabetes mellitus with diabetic chronic kidney disease: Secondary | ICD-10-CM | POA: Diagnosis not present

## 2017-12-25 DIAGNOSIS — K7460 Unspecified cirrhosis of liver: Secondary | ICD-10-CM | POA: Diagnosis not present

## 2017-12-25 DIAGNOSIS — E875 Hyperkalemia: Secondary | ICD-10-CM | POA: Diagnosis not present

## 2017-12-25 LAB — RENAL FUNCTION PANEL
ANION GAP: 13 (ref 5–15)
Albumin: 2.7 g/dL — ABNORMAL LOW (ref 3.5–5.0)
BUN: 112 mg/dL — AB (ref 6–20)
CO2: 25 mmol/L (ref 22–32)
Calcium: 6.5 mg/dL — ABNORMAL LOW (ref 8.9–10.3)
Chloride: 101 mmol/L (ref 101–111)
Creatinine, Ser: 4.9 mg/dL — ABNORMAL HIGH (ref 0.61–1.24)
GFR calc Af Amer: 13 mL/min — ABNORMAL LOW (ref >60)
GFR, EST NON AFRICAN AMERICAN: 11 mL/min — AB (ref >60)
Glucose, Bld: 232 mg/dL — ABNORMAL HIGH (ref 65–99)
PHOSPHORUS: 5.6 mg/dL — AB (ref 2.5–4.6)
POTASSIUM: 4.1 mmol/L (ref 3.5–5.1)
Sodium: 139 mmol/L (ref 135–145)

## 2017-12-25 LAB — GLUCOSE, CAPILLARY
GLUCOSE-CAPILLARY: 185 mg/dL — AB (ref 65–99)
GLUCOSE-CAPILLARY: 180 mg/dL — AB (ref 65–99)
GLUCOSE-CAPILLARY: 338 mg/dL — AB (ref 65–99)
Glucose-Capillary: 167 mg/dL — ABNORMAL HIGH (ref 65–99)

## 2017-12-25 NOTE — Progress Notes (Signed)
[facility] Rounding Note  Background: 67 y.o. year-old hx of cirrhosis/ascites, DM2,  admitted April 2019 AKI on CKD, hemoptysis and lower ext edema.  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 Renal biopsy 11/17/17 ->diabetic GN w/ secondary FSGS and not enough glomeruli for  IF staining for anti-GBM Ab's (but showed no evidence of active GN).  Had temporary dialysis but renal function improved and was dc'd off dialysis. Dc'd on 4/16, readmitted 4/18 w/hemoptysis, had PLEX w/ FFP, re-pulsed with IV steroids. Creat peaked at 5.4 down to 3.78 on 4/27.  Seen by oncology 5/13 Dr Julien Nordmann for pancytopenia w/rec to continue Amicar but no need for continued Nplate.  Presented again  5/17 with c/o SOB, cough, hypoxemia.  CT chest showed findings suggesting bilat infiltrates possilbe edema. Rec'd high dose IV lasix.  He had abd distension but known hx of cirrhosis with ascites. He was started on IV abx as well for possible PNA. Diuresed net of about 3L.  Creat was 3.6 on admission, 3.9 5/18 and and 4.92 5/19 day of consult by Dr. Jonnie Finner.   Date            Creat summary               08/2015         1.23 06/2017       3.01 08/2017         3.54 09/2017         3.53 11/2017       3.6- 5.7  Assessment/Recommendations  1. CKD stage V - no biopsy evidence that patient actually has/had renal GN.  Biopsy negative for active GN even though there inadequate tissue for IF.  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 Kidneys bilaterally small and echodense. Probably looking at long term effects HTN/DM - not active GN. Creat bumped ^ with diuresis x 2 days, another point arguing against simple vol overload. Renal fx sl better (creatinine down a hair) today  The only thing we have  going on for Goodpasture's is a low titer anti-GBM x 1 Anti GBM normal now. 1. VVS (Dr. Donzetta Matters) will  place AVF - postponed until Friday 2/2 poor BS control  2. I have not feel needed catheter, renal fx a little worse. Not uremic clinically tho numbers looking just a little worse  2. Pulmonary infiltrates with prior hemoptysis - Infiltrates extensive and have never really subsided upon reviewing the last 6 months of CXR's and CT's. Clinically not significantly vol overloaded to account for the imaging changes.  Please consult pulmonary for persistent lung symptoms and imaging changes. He is still getting prednisone and does NOT need this for renal reasons   3.  Cirrhosis w ascites - known 4.  DM2 insulin requiring 5.  HTN on ccb/ coreg and diuretics at home.  6.  Volume - Diuretics have been on hold. No edema, weight is up some, maybe few more crackles R side. Resumed 40 BID (which for current GFR not a large dose at all) 7.  HCAP - getting cefipime.  8.  Acute hypoxic resp failure - suspect more rel to infection, less so to volume 9.  Diarrhea w/+ giardia, on po flagyl 10. dCHF - vol steady for now 11 Anemia - part of cytopenias. Iron studies are fine.  Added weekly Aranesp, dosed with 200 on 5/20 12  CKD-MBD - PTH 123 no calcitriol needed.  Added ca acetate 5/21 for low ca and hyperphosphatemia  Jamal Maes, MD [facility] [phone] Pager 12/25/2017, 7:56 AM  Subjective/Interval History: AVF postponed 2/2 elev BS resch for tomorrow Son says Dad had a good night No CP, SOB or hemoptysis Appetite remains good  Objective Vital signs in last 24 hours: Vitals:   12/24/17 1647 12/24/17 2100 12/25/17 0000 12/25/17 0406  BP: (!) 145/71 (!) 152/78 (!) 161/77 (!) 166/81  Pulse: 81 85 83 81  Resp: 20 (!) 24 12 13   Temp: 98.3 F (36.8 C) 98.1 F (36.7 C) 98.3 F (36.8 C) 98.2 F (36.8 C)  TempSrc: Oral Oral Oral Oral  SpO2: 99% 95% 100% 94%  Weight:    73.1 kg (161 lb 2.5 oz)  Height:       Weight change: -2.2 kg (-4 lb 13.6 oz)  Intake/Output Summary (Last 24  hours) at 12/25/2017 0756 Last data filed at 12/24/2017 2119 Gross per 24 hour  Intake 780 ml  Output -  Net 780 ml   Physical Exam:  Blood pressure (!) 166/81, pulse 81, temperature 98.2 F (36.8 C), temperature source Oral, resp. rate 13, height 5' 5"  (1.651 m), weight 73.1 kg (161 lb 2.5 oz), SpO2 94 %.   Alert, no distress, calm and pleasant Non English speaking Smiling, O2 on, no ^ WOB No JVD Chest clear ant, ? Few R base crackles Regular rhythm S1S2 normal. No S3 Abd soft, not tender, moderate ascites  No LE edema No asterixus  Recent Labs  Lab 12/19/17 1414  12/19/17 1938 12/20/17 0458 12/21/17 0330 12/22/17 0324 12/23/17 0232 12/24/17 0254 12/25/17 0418  NA 141   < > 140 141 142 139 137 137 139  K 5.4*   < > 4.9 5.3* 5.9* 4.0 3.8 3.8 4.1  CL 104   < > 105 104 103 98* 99* 99* 101  CO2 25  --   --  22 20* 23 24 24 25   GLUCOSE 64*   < > 110* 128* 165* 506* 367* 221* 232*  BUN 81*   < > 63* 77* 83* 92* 93* 107* 112*  CREATININE 4.16*   < > 3.40* 3.99* 4.92* 4.81* 4.52* 4.64* 4.90*  CALCIUM 7.0*  --   --  6.5* 6.6* 6.4* 5.9* 6.5* 6.5*  PHOS  --   --   --   --   --   --  7.0* 6.4* 5.6*   < > = values in this interval not displayed.    Recent Labs  Lab 12/19/17 1414 12/21/17 0330 12/22/17 0324 12/23/17 0232 12/24/17 0254 12/25/17 0418  AST 35 36 25  --   --   --   ALT 27 21 22   --   --   --   ALKPHOS 75 60 61  --   --   --   BILITOT 1.9* 2.9* 1.8*  --   --   --   PROT 6.1* 5.2* 5.3*  --   --   --   ALBUMIN 3.4* 2.7* 2.6* 2.5* 2.7* 2.7*   Recent Labs  Lab 12/19/17 1414  LIPASE 25    Recent Labs  Lab 12/19/17 1414  12/21/17 0556 12/22/17 0324 12/23/17 0232 12/24/17 0254  WBC 7.3   < > 2.5* 3.4* 3.5* 3.7*  NEUTROABS 5.0  --   --   --   --   --   HGB 10.9*   < > 8.5*  7.9* 7.8* 8.5*  HCT 33.8*   < > 26.6* 24.3* 23.9* 25.9*  MCV 78.4   < > 80.4 77.6* 77.9* 77.3*  PLT 89*   < > 43* 49* 46* 43*   < > = values in this interval not displayed.     Recent Labs  Lab 12/24/17 0808 12/24/17 1136 12/24/17 1557 12/24/17 2115 12/25/17 0730  GLUCAP 206* 223* 211* 148* 185*   Renal biopsy: 11/17/17 DIABETIC GLOMERULOPATHY IN ASSOCIATION WITH MODERATE TO SEVERE ARTERIONEPHROSCLEROSIS AND SECONDARY FOCAL AND SEGMENTAL GLOMERUSCLEROSIS. ACUTE TUBULAR INJURY. NO EVIDENCE OF AN ACTIVE GLOMERULONEPHRITIS.  PTH    Component Value Date/Time   PTH 123 (H) 12/23/2017 0232   Medications: .  ceFAZolin (ANCEF) IV     . aminocaproic acid  500 mg Oral Q8H  . amLODipine  10 mg Oral Daily  . calcium acetate  1,334 mg Oral TID WC  . carvedilol  12.5 mg Oral BID WC  . darbepoetin (ARANESP) injection - NON-DIALYSIS  200 mcg Subcutaneous Q Mon-1800  . feeding supplement (PRO-STAT SUGAR FREE 64)  30 mL Oral BID  . ferrous gluconate  324 mg Oral Q breakfast  . furosemide  40 mg Oral BID  . insulin aspart  0-15 Units Subcutaneous TID WC  . insulin aspart  3 Units Subcutaneous TID WC  . insulin detemir  10 Units Subcutaneous BID  . metroNIDAZOLE  500 mg Oral Q12H  . predniSONE  20 mg Oral Q breakfast  . saccharomyces boulardii  250 mg Oral BID

## 2017-12-25 NOTE — Plan of Care (Signed)

## 2017-12-25 NOTE — Progress Notes (Signed)
Physical Therapy Treatment Patient Details Name: William [name] MRN: [id number] DOB: 09-Jul-1951 Today's Date: 12/25/2017    History of Present Illness 67 year old male who presented with dyspnea and cough. He does have significant past medical history for Goodpasture syndrome, pancytopenia, cirrhosis, chronic kidney disease stage IV, type 2 diabetes mellitus and heart failure with preserved ejection fraction. Patient had recent hospitalizations due to renal failure and decompensated liver disease. On day of admission apparently patient had dyspnea and fevers for 24 hours.     PT Comments    Pt with good progress towards his goals. Pt with increased stability with gait with utilizing [place]. Pt currently mod I for bed mobility, min guard for transfers and ambulation of 600 feet with SPC. Pt does require minA for steadying with ascent/descent of 10 steps. Pt and son refused use of interpreter and son offered some help in interpretation during session. D/c plans continue to remain appropriate at this time.     Follow Up Recommendations  No PT follow up     Equipment Recommendations  Cane    Recommendations for Other Services       Precautions / Restrictions Precautions Precautions: Fall Restrictions Weight Bearing Restrictions: No    Mobility  Bed Mobility Overal bed mobility: Modified Independent                Transfers Overall transfer level: Needs assistance Equipment used: None Transfers: Sit to/from Stand Sit to Stand: Min guard         General transfer comment: min guard for safety, use of SPC to initiate powerup and to steady in standing   Ambulation/Gait Ambulation/Gait assistance: Min guard Ambulation Distance (Feet): 500 Feet Assistive device: Straight cane Gait Pattern/deviations: Step-through pattern Gait velocity: WFL Gait velocity interpretation: >4.37 ft/sec, indicative of normal walking speed General Gait Details: min guard for safety, continues to  have quick gait, able to utilize [facility] to increased stability   Stairs Stairs: Yes Stairs assistance: Min assist Stair Management: With cane;One rail Right;One rail Left;Alternating pattern;Forwards Number of Stairs: 10 General stair comments: minA for steadying with ascent/descent of 10 steps, use of tactile cues for sequencing with SPC for maximal stability      Balance Overall balance assessment: Needs assistance Sitting-balance support: Feet supported;No upper extremity supported Sitting balance-Leahy Scale: Fair     Standing balance support: Single extremity supported Standing balance-Leahy Scale: Fair Standing balance comment: Tends to stabilize with UE support                            Cognition Arousal/Alertness: Awake/alert Behavior During Therapy: WFL for tasks assessed/performed;Impulsive Overall Cognitive Status: Difficult to assess                                           General Comments General comments (skin integrity, edema, etc.): VSS, SaO2 on RA greater than 90%O2 throughout session      Pertinent Vitals/Pain Pain Assessment: No/denies pain           PT Goals (current goals can now be found in the care plan section) Acute Rehab PT Goals Patient Stated Goal: did not state PT Goal Formulation: With family Time For Goal Achievement: 01/06/18 Potential to Achieve Goals: Good Progress towards PT goals: Progressing toward goals    Frequency    Min 3X/week  PT Plan Current plan remains appropriate       AM-PAC PT "6 Clicks" Daily Activity  Outcome Measure  Difficulty turning over in bed (including adjusting bedclothes, sheets and blankets)?: None Difficulty moving from lying on back to sitting on the side of the bed? : None Difficulty sitting down on and standing up from a chair with arms (e.g., wheelchair, bedside commode, etc,.)?: A Little Help needed moving to and from a bed to chair (including a  wheelchair)?: None Help needed walking in hospital room?: A Little Help needed climbing 3-5 steps with a railing? : A Little 6 Click Score: 21    End of Session Equipment Utilized During Treatment: Gait belt Activity Tolerance: Patient tolerated treatment well Patient left: in chair;with family/visitor present(Physician in room) Nurse Communication: Mobility status;Other (comment)(left pt on room air) PT Visit Diagnosis: Unsteadiness on feet (R26.81)     Time: 1884-1660 PT Time Calculation (min) (ACUTE ONLY): 12 min  Charges:  $Gait Training: 8-22 mins                    G Codes:       Kellina Dreese B. Migdalia Dk PT, DPT Acute Rehabilitation  [phone] Pager [phone]     [place] 12/25/2017, 1:53 PM

## 2017-12-25 NOTE — Progress Notes (Signed)
PT Cancellation Note  Patient Details Name: William [name] MRN: [id number] DOB: [birth year]   Cancelled Treatment:    Reason Eval/Treat Not Completed: (P) Patient at procedure or test/unavailable Pt in CT. PT will follow back this afternoon as able.   Safa Derner B. Migdalia Dk PT, DPT Acute Rehabilitation  [phone] Pager [phone]  [place] 12/25/2017, 10:34 AM

## 2017-12-25 NOTE — Progress Notes (Signed)
Name: William [name] MRN: [id number] DOB: 08/27/1950    ADMISSION DATE:  12/19/2017 CONSULTATION DATE:  5/22  REFERRING MD :  Lorrene Reid (renal)   CHIEF COMPLAINT:  Cough, dyspnea  SIGNIFICANT EVENTS   STUDIES:  CT chest/abd/pelvis 5/17>>>1. Diffuse bilateral airspace opacities likely representing pulmonary edema. Infection/superimposed infection not excluded. 2. Moderate to large amount of ascites again noted with small bilateral pleural effusions and diffuse subcutaneous edema. 3. Cirrhosis and splenomegaly. 4. Cardiomegaly and small pericardial effusion. 5. Coronary artery and Aortic Atherosclerosis (ICD10-I70.0). 6. Prostatomegaly.   RVP 5/17>>> neg  HIV 09/2017>> NR  ANCA 11/07/17>>> neg  ANA 09/2017>> neg  CCP 11/07/17>> wnl  GBM Ab 4/5>>> 29>>6>>5>>10   C-ANCA 11/07/17>>> neg  P-ANCA 11/07/17>> neg  C3, C4>> wnl    HISTORY OF PRESENT ILLNESS:  67yo [place] male with hx DM, CKD, cirrhosis/ascites (NO hx ETOH), CHF, dCHF, HTN who presented 5/17 with cough, fever, dyspnea, diarrhea ([place] POS).  He was admitted twice in April 2019 with AKI on CKD, hemoptysis, pancytopenia and BLE edema thought to all be r/t Goodpasture syndrome treated with high dose steroids, cytoxan (stopped d/t pancytopenia), PLEX.  Renal biopsy showing acute glomerulonephritis with FSGS but not enough specimen to run immunofluorescence for GBM.   He also required temporary dialysis at that time.  Had second admission in April again for hemoptysis and treated with PLEX, FFP, IV steroids.   Initial plans for FOB with hx during that admission were deferred r/t severe thrombocytopenia.    He returned 5/17 with cough, SOB, hypoemia.  CT chest still with bilateral infiltrates and despite diuresis and empiric abx for ?PNA.   Scr has continued to rise with diuretics and renal consulted.  Now some ?as to whether pt has or actually had renal GN.     PCCM consulted to assist and potentially consider FOB.    Currently feeling  much better.  Family at bedside translating.  No further hemoptysis.  Denies dyspnea, chest pain, edema.   SUBJECTIVE:   VITAL SIGNS: Temp:  [98.1 F (36.7 C)-98.3 F (36.8 C)] 98.3 F (36.8 C) (05/23 0804) Pulse Rate:  [74-85] 74 (05/23 1000) Resp:  [11-24] 11 (05/23 1000) BP: (145-166)/(71-95) 165/95 (05/23 0804) SpO2:  [94 %-100 %] 99 % (05/23 1000) Weight:  [73.1 kg (161 lb 2.5 oz)] 73.1 kg (161 lb 2.5 oz) (05/23 0406)  PHYSICAL EXAMINATION: General:  Comfortable, laying in bed, RA Neuro:  Awake, interacting w family, moves all ext HEENT:  Mm moist, no JVD  Cardiovascular:  s1s2 rrr Lungs:  Few B insp crackles.  Abdomen:  Soft, benign, + BS Musculoskeletal:  Warm and dry, no edema   Recent Labs  Lab 12/23/17 0232 12/24/17 0254 12/25/17 0418  NA 137 137 139  K 3.8 3.8 4.1  CL 99* 99* 101  CO2 24 24 25   BUN 93* 107* 112*  CREATININE 4.52* 4.64* 4.90*  GLUCOSE 367* 221* 232*   Recent Labs  Lab 12/22/17 0324 12/23/17 0232 12/24/17 0254  HGB 7.9* 7.8* 8.5*  HCT 24.3* 23.9* 25.9*  WBC 3.4* 3.5* 3.7*  PLT 49* 46* 43*   Dg Esophagus  Result Date: 12/25/2017 CLINICAL DATA:  Aspiration pneumonia EXAM: ESOPHOGRAM/BARIUM SWALLOW TECHNIQUE: Single contrast examination was performed using  thin barium. FLUOROSCOPY TIME:  Fluoroscopy Time:  0 minutes 59 seconds Radiation Exposure Index (if provided by the fluoroscopic device): Number of Acquired Spot Images: 0 COMPARISON:  CT chest 12/19/2017 FINDINGS: Negative for aspiration. Moderate decrease in esophageal peristalsis.  Negative for hiatal hernia. No stricture or mass 2 cm esophageal diverticulum, just above the carina. This has smooth margins and appears chronic. No significant associated mucosal edema. IMPRESSION: Negative for aspiration Esophageal dysmotility 2 cm soft the geode diverticulum above the carina likely an incidental finding. Electronically Signed   By: Franchot Gallo M.D.   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  His course has been characterized by renal failure, bilateral infiltrates with associated hemoptysis, thrombocytopenia.  He underwent renal biopsy that did not show an ANCA positive vasculitis, was more consistent with a diabetic nephropathy.  During the work-up he is also been found to have cirrhosis, ascites.  He has been treated aggressively over the last 6 weeks, has received plasmapheresis, Cytoxan plus steroids, Amicar.  He was most recently admitted on 5/17 with cough, fever, dyspnea.  Attempts have been made at diuresis (he is 3.9 L net positive) and he was treated empirically for possible healthcare associated pneumonia. He will likely require dialysis soon.   Etiology of his bilateral infiltrates is unclear to me.  They should have cleared radiographically with the interventions have been made if this were a vasculitic pulmonary nodular disease.  I do not see any evidence that they have changed on serial imaging.  Consider recurrent aspiration injury, possible pulmonary hemorrhage, noninfectious inflammatory process other than a vasculitis.  Certainly pulmonary edema given abnormal fluid balance, renal failure is a possibility but I would expect this to wax and wane depending on volume status.  Gold standard to investigate would be a VATS biopsy but not clear to me that he would tolerate or should undergo this with his thrombocytopenia.  Transbronchial biopsies would not give an answer.  Bronchoscopy could still be indicated if we believe that there was opportunistic infection here given his immunosuppression, prednisone use.   RECS:  - Agree with following off abx - Not clear to me that there is a benefit or indication for steroids at this time.  Agree with tapering to off over the next several days. - He will need  repeat imaging of his chest as an outpatient and follow-up with Dr. Elsworth Soho to consider next steps.  VATS biopsy carries significant risk and we will need to consider carefully. - If he clinically worsens then I would favor bronchoscopy, BAL, to evaluate for possible opportunistic infection - Volume status as per nephrology's recommendations  I explained the plan to the patient through his son as Optometrist.    Baltazar Apo, MD, PhD 12/25/2017, 3:36 PM [facility] [phone] or if no answer [phone]

## 2017-12-25 NOTE — Progress Notes (Signed)
PROGRESS NOTE    William [name]  NAT:[id number] DOB: 04/08/1951 DOA: 12/19/2017 PCP: Gildardo Pounds, NP    Brief Narrative:  66 year old male who presented with dyspnea and cough. He does have significant past medical history forGoodpasturesyndrome, pancytopenia, cirrhosis,chronic kidney disease stage IV, type 2 diabetes mellitus and heart failure with preserved ejection fraction.Patient had recent hospitalizations due to renal failure and decompensated liver disease. On day of admission apparently patient had dyspnea and fevers for 24 hours. On initial physical examination blood pressure 157/76, heart rate 94, respiratory 24, oxygen saturation 95%.Moist mucous membranes, lungs scattered rhonchi bilaterally, decreased breath sounds at bases, possibly rales, poor respiratory effort, heart S1-S2 present and rhythmic, no gallops or gallops, positive systolic murmur, abdomen severely distended but nontender, positive pitting lower extremity edema bilaterally.Sodium 141, potassium 5.4, chloride 104, bicarbonate 25, glucose 64, BUN 81, creatinine 4.16, white count 7.3, 1110.9, hematocrit 33.8,platelets 89.Chest x-ray with alveolar infiltrates in the right upper and lower lobe, CT chest with bilateral centralupper lobesalveolar infiltrates, bibasilar atelectasis.  Patient was admitted to the hospital working diagnosis of acute hypoxic respiratory failure, torule out healthcare associatedpneumonia.  Assessment & Plan:   Active Problems:   Type II diabetes mellitus with renal manifestations (HCC)   Acute respiratory failure with hypoxia (HCC)   Pancytopenia (HCC)   Essential hypertension   Goodpasture's syndrome (HCC) with associated hemoptysis   Cirrhosis of liver with ascites (HCC)   CKD (chronic kidney disease) stage 4, GFR 15-29 ml/min (HCC)   Hyperkalemia   HCAP (healthcare-associated pneumonia)   Ascites  1.Acuteon chronichypoxic respiratory failure. Stable infiltrates noted,  continue bronchodilator therapy, oxymetry monitoring and supplemental oxygen per nasal cannula as tolerated.Marland Kitchen   Antibiotics remain on hold. Hb has remained stable, no clinical sings of alveolar hemorrhage.  Patient was recently plan for outpatient bronchoscopy pending improvement of thrombocytopenia. Appreciate input by Pulmonary. Recommendation to wean steroids over the next several days. Repeat chest imaging as outpatient with follow up with Dr. Elsworth Soho  2.Chronic kidney disease stage IV.   Discussed case with nephrology. 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 Patient scheduled for HD access (fistula) placement Friday (12/26/2017).     3.Chronic liver failure.   Patient alert and oriented, not encephalopathic. Family reports increased abdominal girth. Will check abd [place] for any significant ascites.   4.Type 2 diabetes mellitus with uncontrolled hyperglyecmia.   Patient noted to have poorly controlled sugars recently which prompted delay of dialysis access placement.  Have since increased basal insulin to 10 units bid and will decreased steroid dose as per above. Glucose in the 100's. Continue to titrate insulin as tolerated  5.Hypertension. Continue with amlodipine and carvedilol,for blood pressure control.  Remains stable at present   6. Thrombocytopenia. No signs of active bleeding.  Suspicion for reactive thrombocytopenia in the setting of liver disease. Platelets remain stable in the 40's  7. Goodpasture syndrome.   Patient noted to have low positive anti GBM, ANCA negative. Patient is sp pulse steroids and cytoxan. Renal biopsy did not confirm GPS. Will continue supportive medical therapy.  Will continue to wean steroids over the next several days.    8. Giardia induced diarrhea. Currently on PO metronidazole, diarrhea is improving, out of bed as tolerated  and physical therapy evaluation.    DVT prophylaxis: SCDs Code Status: Full code Family Communication: Patient in room, family at bedside Disposition Plan: Uncertain at this time  Consultants:  Pulmonary  Nephrology  Vascular surgery  Procedures:     Antimicrobials: Anti-infectives (From admission, onward)   Start     Dose/Rate Route Frequency Ordered Stop   12/24/17 0845  ceFAZolin (ANCEF) IVPB 2g/100 mL premix    Note to Pharmacy:  Send with pt to OR   2 g 200 mL/hr over 30 Minutes Intravenous To [facility] 12/24/17 0821 12/25/17 0845   12/23/17 2200  metroNIDAZOLE (FLAGYL) tablet 500 mg     500 mg Oral Every 12 hours 12/23/17 1557 12/27/17 2359   12/23/17 0915  metroNIDAZOLE (FLAGYL) tablet 500 mg  Status:  Discontinued     500 mg Oral Every 8 hours 12/23/17 0914 12/23/17 1557   12/21/17 2200  vancomycin (VANCOCIN) IVPB 1000 mg/200 mL premix  Status:  Discontinued     1,000 mg 200 mL/hr over 60 Minutes Intravenous Every 48 hours 12/19/17 2211 12/21/17 0901   12/21/17 1400  ceFEPIme (MAXIPIME) 1 g in sodium chloride 0.9 % 100 mL IVPB  Status:  Discontinued     1 g 200 mL/hr over 30 Minutes Intravenous Every 24 hours 12/21/17 0901 12/22/17 1017   12/21/17 1030  metroNIDAZOLE (FLAGYL) IVPB 500 mg  Status:  Discontinued     500 mg 100 mL/hr over 60 Minutes Intravenous Every 8 hours 12/21/17 0942 12/23/17 0914   12/19/17 2215  piperacillin-tazobactam (ZOSYN) IVPB 2.25 g  Status:  Discontinued     2.25 g 100 mL/hr over 30 Minutes Intravenous Every 8 hours 12/19/17 2211 12/21/17 0901   12/19/17 1445  vancomycin (VANCOCIN) 1,500 mg in sodium chloride 0.9 % 500 mL IVPB     1,500 mg 250 mL/hr over 120 Minutes Intravenous  Once 12/19/17 1428 12/19/17 1843   12/19/17 1415  piperacillin-tazobactam (ZOSYN) IVPB 3.375 g     3.375 g 100 mL/hr over 30 Minutes Intravenous  Once 12/19/17 1410 12/19/17 1713   12/19/17 1415  vancomycin (VANCOCIN) IVPB 1000 mg/200 mL premix   Status:  Discontinued     1,000 mg 200 mL/hr over 60 Minutes Intravenous  Once 12/19/17 1410 12/19/17 1428      Subjective: Without complaints  Objective: Vitals:   12/25/17 1230 12/25/17 1300 12/25/17 1500 12/25/17 1554  BP: (!) 155/85   136/65  Pulse: 83 74  82  Resp: 14 12 15 13   Temp: 97.9 F (36.6 C)   98.4 F (36.9 C)  TempSrc: Oral   Oral  SpO2: 100% 98%  99%  Weight:      Height:        Intake/Output Summary (Last 24 hours) at 12/25/2017 1718 Last data filed at 12/25/2017 1230 Gross per 24 hour  Intake 720 ml  Output -  Net 720 ml   Filed Weights   12/23/17 0500 12/24/17 0500 12/25/17 0406  Weight: 74.3 kg (163 lb 12.8 oz) 75.3 kg (166 lb 0.1 oz) 73.1 kg (161 lb 2.5 oz)    Examination: General exam: Awake, laying in bed, in nad Respiratory system: Normal respiratory effort, no wheezing Cardiovascular system: regular rate, s1, s2 Gastrointestinal system: Soft, nondistended, positive BS Central nervous system: CN2-12 grossly intact, strength intact Extremities: Perfused, no clubbing Skin: Normal skin turgor, no notable skin lesions seen Psychiatry: Mood normal // no visual hallucinations    Data Reviewed: I have personally reviewed following labs and imaging studies  CBC: Recent Labs  Lab 12/19/17 1414  12/20/17 0458 12/21/17 0556 12/22/17 0324 12/23/17 0232 12/24/17 0254  WBC 7.3   < > 5.2  2.5* 3.4* 3.5* 3.7*  NEUTROABS 5.0  --   --   --   --   --   --   HGB 10.9*   < > 9.8* 8.5* 7.9* 7.8* 8.5*  HCT 33.8*   < > 31.1* 26.6* 24.3* 23.9* 25.9*  MCV 78.4   < > 79.7 80.4 77.6* 77.9* 77.3*  PLT 89*   < > 81* 43* 49* 46* 43*   < > = values in this interval not displayed.   Basic Metabolic Panel: Recent Labs  Lab 12/21/17 0330 12/22/17 0324 12/23/17 0232 12/24/17 0254 12/25/17 0418  NA 142 139 137 137 139  K 5.9* 4.0 3.8 3.8 4.1  CL 103 98* 99* 99* 101  CO2 20* 23 24 24 25   GLUCOSE 165* 506* 367* 221* 232*  BUN 83* 92* 93* 107* 112*    CREATININE 4.92* 4.81* 4.52* 4.64* 4.90*  CALCIUM 6.6* 6.4* 5.9* 6.5* 6.5*  PHOS  --   --  7.0* 6.4* 5.6*   GFR: Estimated Creatinine Clearance: 12.9 mL/min (A) (by C-G formula based on SCr of 4.9 mg/dL (H)). Liver Function Tests: Recent Labs  Lab 12/19/17 1414 12/21/17 0330 12/22/17 0324 12/23/17 0232 12/24/17 0254 12/25/17 0418  AST 35 36 25  --   --   --   ALT 27 21 22   --   --   --   ALKPHOS 75 60 61  --   --   --   BILITOT 1.9* 2.9* 1.8*  --   --   --   PROT 6.1* 5.2* 5.3*  --   --   --   ALBUMIN 3.4* 2.7* 2.6* 2.5* 2.7* 2.7*   Recent Labs  Lab 12/19/17 1414  LIPASE 25   No results for input(s): AMMONIA in the last 168 hours. Coagulation Profile: No results for input(s): INR, PROTIME in the last 168 hours. Cardiac Enzymes: No results for input(s): CKTOTAL, CKMB, CKMBINDEX, TROPONINI in the last 168 hours. BNP (last 3 results) Recent Labs    09/25/17 1614  PROBNP 380.0*   HbA1C: No results for input(s): HGBA1C in the last 72 hours. CBG: Recent Labs  Lab 12/24/17 1557 12/24/17 2115 12/25/17 0730 12/25/17 1200 12/25/17 1633  GLUCAP 211* 148* 185* 167* 180*   Lipid Profile: No results for input(s): CHOL, HDL, LDLCALC, TRIG, CHOLHDL, LDLDIRECT in the last 72 hours. Thyroid Function Tests: No results for input(s): TSH, T4TOTAL, FREET4, T3FREE, THYROIDAB in the last 72 hours. Anemia Panel: Recent Labs    12/23/17 0232  TIBC 153*  IRON 119   Sepsis Labs: Recent Labs  Lab 12/19/17 1421  LATICACIDVEN 1.07    Recent Results (from the past 240 hour(s))  Blood Culture (routine x 2)     Status: None   Collection Time: 12/19/17  2:10 PM  Result Value Ref Range Status   Specimen Description BLOOD RIGHT ANTECUBITAL  Final   Special Requests AEROBIC BOTTLE ONLY Blood Culture adequate volume  Final   Culture   Final    NO GROWTH 5 DAYS Performed at [facility], [name] [address], [place], [place] [address]    Report Status 12/24/2017 FINAL  Final   Blood Culture (routine x 2)     Status: None   Collection Time: 12/19/17  4:32 PM  Result Value Ref Range Status   Specimen Description BLOOD BLOOD RIGHT HAND  Final   Special Requests   Final    BOTTLES DRAWN AEROBIC AND ANAEROBIC Blood Culture adequate  volume   Culture   Final    NO GROWTH 5 DAYS Performed at [facility], [place] [address], [place], [place] [address]    Report Status 12/24/2017 FINAL  Final  Gastrointestinal Panel by PCR , Stool     Status: Abnormal   Collection Time: 12/19/17  6:47 PM  Result Value Ref Range Status   Campylobacter species NOT DETECTED NOT DETECTED Final   Plesimonas shigelloides NOT DETECTED NOT DETECTED Final   Salmonella species NOT DETECTED NOT DETECTED Final   Yersinia enterocolitica NOT DETECTED NOT DETECTED Final   Vibrio species NOT DETECTED NOT DETECTED Final   Vibrio cholerae NOT DETECTED NOT DETECTED Final   Enteroaggregative E coli (EAEC) NOT DETECTED NOT DETECTED Final   Enteropathogenic E coli (EPEC) NOT DETECTED NOT DETECTED Final   Enterotoxigenic E coli (ETEC) NOT DETECTED NOT DETECTED Final   Shiga like toxin producing E coli (STEC) NOT DETECTED NOT DETECTED Final   E. coli O157 NOT DETECTED NOT DETECTED Final   Shigella/Enteroinvasive E coli (EIEC) NOT DETECTED NOT DETECTED Final   Cryptosporidium NOT DETECTED NOT DETECTED Final   Cyclospora cayetanensis NOT DETECTED NOT DETECTED Final   Entamoeba histolytica NOT DETECTED NOT DETECTED Final   Giardia lamblia DETECTED (A) NOT DETECTED Final   Adenovirus F40/41 NOT DETECTED NOT DETECTED Final   Astrovirus NOT DETECTED NOT DETECTED Final   Norovirus GI/GII NOT DETECTED NOT DETECTED Final   Rotavirus A NOT DETECTED NOT DETECTED Final   Sapovirus (I, II, IV, and V) NOT DETECTED NOT DETECTED Final    Comment: Performed at [facility], [address] [place], [place] [address]  C difficile quick scan w PCR reflex     Status: None   Collection Time: 12/19/17  6:47  PM  Result Value Ref Range Status   C Diff antigen NEGATIVE NEGATIVE Final   C Diff toxin NEGATIVE NEGATIVE Final   C Diff interpretation No C. difficile detected.  Final    Comment: Performed at [facility], [place] [address], [place], [place] [address]  Respiratory Panel by PCR     Status: None   Collection Time: 12/19/17 11:16 PM  Result Value Ref Range Status   Adenovirus NOT DETECTED NOT DETECTED Final   Coronavirus 229E NOT DETECTED NOT DETECTED Final   Coronavirus HKU1 NOT DETECTED NOT DETECTED Final   Coronavirus NL63 NOT DETECTED NOT DETECTED Final   Coronavirus OC43 NOT DETECTED NOT DETECTED Final   Metapneumovirus NOT DETECTED NOT DETECTED Final   Rhinovirus / Enterovirus NOT DETECTED NOT DETECTED Final   Influenza A NOT DETECTED NOT DETECTED Final   Influenza B NOT DETECTED NOT DETECTED Final   Parainfluenza Virus 1 NOT DETECTED NOT DETECTED Final   Parainfluenza Virus 2 NOT DETECTED NOT DETECTED Final   Parainfluenza Virus 3 NOT DETECTED NOT DETECTED Final   Parainfluenza Virus 4 NOT DETECTED NOT DETECTED Final   Respiratory Syncytial Virus NOT DETECTED NOT DETECTED Final   Bordetella pertussis NOT DETECTED NOT DETECTED Final   Chlamydophila pneumoniae NOT DETECTED NOT DETECTED Final   Mycoplasma pneumoniae NOT DETECTED NOT DETECTED Final  MRSA PCR Screening     Status: None   Collection Time: 12/20/17  7:51 PM  Result Value Ref Range Status   MRSA by PCR NEGATIVE NEGATIVE Final    Comment:        The GeneXpert MRSA Assay (FDA approved for NASAL specimens only), is one component of a comprehensive MRSA colonization surveillance  program. It is not intended to diagnose MRSA infection nor to guide or monitor treatment for MRSA infections. Performed at [facility], [place] [address], [place], [place] [address]      Radiology Studies: Dg Esophagus  Result Date: 12/25/2017 CLINICAL DATA:  Aspiration pneumonia EXAM: ESOPHOGRAM/BARIUM SWALLOW TECHNIQUE:  Single contrast examination was performed using  thin barium. FLUOROSCOPY TIME:  Fluoroscopy Time:  0 minutes 59 seconds Radiation Exposure Index (if provided by the fluoroscopic device): Number of Acquired Spot Images: 0 COMPARISON:  CT chest 12/19/2017 FINDINGS: Negative for aspiration. Moderate decrease in esophageal peristalsis. Negative for hiatal hernia. No stricture or mass 2 cm esophageal diverticulum, just above the carina. This has smooth margins and appears chronic. No significant associated mucosal edema. IMPRESSION: Negative for aspiration Esophageal dysmotility 2 cm soft the geode diverticulum above the carina likely an incidental finding. Electronically Signed   By: Franchot Gallo M.D.   On: 12/25/2017 10:57    Scheduled Meds: . aminocaproic acid  500 mg Oral Q8H  . amLODipine  10 mg Oral Daily  . calcium acetate  1,334 mg Oral TID WC  . carvedilol  12.5 mg Oral BID WC  . darbepoetin (ARANESP) injection - NON-DIALYSIS  200 mcg Subcutaneous Q Mon-1800  . feeding supplement (PRO-STAT SUGAR FREE 64)  30 mL Oral BID  . ferrous gluconate  324 mg Oral Q breakfast  . furosemide  40 mg Oral BID  . insulin aspart  0-15 Units Subcutaneous TID WC  . insulin aspart  3 Units Subcutaneous TID WC  . insulin detemir  10 Units Subcutaneous BID  . metroNIDAZOLE  500 mg Oral Q12H  . predniSONE  20 mg Oral Q breakfast  . saccharomyces boulardii  250 mg Oral BID   Continuous Infusions:    LOS: 6 days   Marylu Lund, MD [facility] Pager [phone]  If 7PM-7AM, please contact night-coverage [email or web address] Password [facility] 12/25/2017, 5:18 PM

## 2017-12-26 ENCOUNTER — Encounter (HOSPITAL_COMMUNITY)
Admission: EM | Disposition: A | Discharge status: Home / Self Care | Payer: Self-pay | Source: Home / Self Care | Attending: Internal Medicine

## 2017-12-26 ENCOUNTER — Inpatient Hospital Stay (HOSPITAL_COMMUNITY): Payer: Medicare Other

## 2017-12-26 ENCOUNTER — Inpatient Hospital Stay (HOSPITAL_COMMUNITY): Payer: Medicare Other | Admitting: Anesthesiology

## 2017-12-26 ENCOUNTER — Encounter (HOSPITAL_COMMUNITY): Payer: Self-pay | Admitting: Anesthesiology

## 2017-12-26 DIAGNOSIS — E1121 Type 2 diabetes mellitus with diabetic nephropathy: Secondary | ICD-10-CM | POA: Diagnosis not present

## 2017-12-26 DIAGNOSIS — J9601 Acute respiratory failure with hypoxia: Secondary | ICD-10-CM | POA: Diagnosis not present

## 2017-12-26 DIAGNOSIS — R188 Other ascites: Secondary | ICD-10-CM | POA: Diagnosis not present

## 2017-12-26 DIAGNOSIS — I129 Hypertensive chronic kidney disease with stage 1 through stage 4 chronic kidney disease, or unspecified chronic kidney disease: Secondary | ICD-10-CM | POA: Diagnosis not present

## 2017-12-26 DIAGNOSIS — K7460 Unspecified cirrhosis of liver: Secondary | ICD-10-CM | POA: Diagnosis not present

## 2017-12-26 DIAGNOSIS — N170 Acute kidney failure with tubular necrosis: Secondary | ICD-10-CM | POA: Diagnosis not present

## 2017-12-26 DIAGNOSIS — D61818 Other pancytopenia: Secondary | ICD-10-CM | POA: Diagnosis not present

## 2017-12-26 DIAGNOSIS — J189 Pneumonia, unspecified organism: Secondary | ICD-10-CM | POA: Diagnosis not present

## 2017-12-26 DIAGNOSIS — I132 Hypertensive heart and chronic kidney disease with heart failure and with stage 5 chronic kidney disease, or end stage renal disease: Secondary | ICD-10-CM | POA: Diagnosis not present

## 2017-12-26 DIAGNOSIS — A071 Giardiasis [lambliasis]: Secondary | ICD-10-CM | POA: Diagnosis not present

## 2017-12-26 DIAGNOSIS — A419 Sepsis, unspecified organism: Secondary | ICD-10-CM | POA: Diagnosis not present

## 2017-12-26 DIAGNOSIS — M310 Hypersensitivity angiitis: Secondary | ICD-10-CM | POA: Diagnosis not present

## 2017-12-26 DIAGNOSIS — N185 Chronic kidney disease, stage 5: Secondary | ICD-10-CM | POA: Diagnosis not present

## 2017-12-26 DIAGNOSIS — I10 Essential (primary) hypertension: Secondary | ICD-10-CM | POA: Diagnosis not present

## 2017-12-26 DIAGNOSIS — J9621 Acute and chronic respiratory failure with hypoxia: Secondary | ICD-10-CM | POA: Diagnosis not present

## 2017-12-26 DIAGNOSIS — N184 Chronic kidney disease, stage 4 (severe): Secondary | ICD-10-CM | POA: Diagnosis not present

## 2017-12-26 DIAGNOSIS — I5032 Chronic diastolic (congestive) heart failure: Secondary | ICD-10-CM | POA: Diagnosis not present

## 2017-12-26 DIAGNOSIS — E1122 Type 2 diabetes mellitus with diabetic chronic kidney disease: Secondary | ICD-10-CM | POA: Diagnosis not present

## 2017-12-26 DIAGNOSIS — R042 Hemoptysis: Secondary | ICD-10-CM | POA: Diagnosis not present

## 2017-12-26 HISTORY — PX: AV FISTULA PLACEMENT: SHX1204

## 2017-12-26 HISTORY — PX: IR PARACENTESIS: IMG2679

## 2017-12-26 LAB — BODY FLUID CELL COUNT WITH DIFFERENTIAL
LYMPHS FL: 87 %
MONOCYTE-MACROPHAGE-SEROUS FLUID: 13 % — AB (ref 50–90)
WBC FLUID: 65 uL (ref 0–1000)

## 2017-12-26 LAB — GLUCOSE, CAPILLARY
GLUCOSE-CAPILLARY: 304 mg/dL — AB (ref 65–99)
Glucose-Capillary: 331 mg/dL — ABNORMAL HIGH (ref 65–99)
Glucose-Capillary: 316 mg/dL — ABNORMAL HIGH (ref 65–99)
Glucose-Capillary: 287 mg/dL — ABNORMAL HIGH (ref 65–99)
Glucose-Capillary: 260 mg/dL — ABNORMAL HIGH (ref 65–99)

## 2017-12-26 LAB — GRAM STAIN

## 2017-12-26 LAB — BASIC METABOLIC PANEL
ANION GAP: 14 (ref 5–15)
BUN: 111 mg/dL — ABNORMAL HIGH (ref 6–20)
CALCIUM: 6.5 mg/dL — AB (ref 8.9–10.3)
CO2: 23 mmol/L (ref 22–32)
Chloride: 97 mmol/L — ABNORMAL LOW (ref 101–111)
Creatinine, Ser: 4.74 mg/dL — ABNORMAL HIGH (ref 0.61–1.24)
GFR, EST AFRICAN AMERICAN: 13 mL/min — AB (ref >60)
GFR, EST NON AFRICAN AMERICAN: 12 mL/min — AB (ref >60)
Glucose, Bld: 384 mg/dL — ABNORMAL HIGH (ref 65–99)
POTASSIUM: 4.6 mmol/L (ref 3.5–5.1)
SODIUM: 134 mmol/L — AB (ref 135–145)

## 2017-12-26 LAB — CBC
HCT: 29.1 % — ABNORMAL LOW (ref 39.0–52.0)
HEMOGLOBIN: 9.5 g/dL — AB (ref 13.0–17.0)
MCH: 25.3 pg — ABNORMAL LOW (ref 26.0–34.0)
MCHC: 32.6 g/dL (ref 30.0–36.0)
MCV: 77.6 fL — ABNORMAL LOW (ref 78.0–100.0)
Platelets: 52 10*3/uL — ABNORMAL LOW (ref 150–400)
RBC: 3.75 MIL/uL — AB (ref 4.22–5.81)
RDW: 17.2 % — ABNORMAL HIGH (ref 11.5–15.5)
WBC: 4.6 10*3/uL (ref 4.0–10.5)

## 2017-12-26 LAB — PROTIME-INR
INR: 1.27
PROTHROMBIN TIME: 15.7 s — AB (ref 11.4–15.2)

## 2017-12-26 SURGERY — ARTERIOVENOUS (AV) FISTULA CREATION
Anesthesia: General | Site: Arm Upper | Laterality: Left

## 2017-12-26 MED ORDER — PREDNISONE 10 MG PO TABS: 10.0000 mg | ORAL_TABLET | Freq: Every day | ORAL | Status: DC

## 2017-12-26 MED ORDER — LIDOCAINE HCL (PF) 1 % IJ SOLN
INTRAMUSCULAR | Status: AC
  Filled 2017-12-26: qty 30

## 2017-12-26 MED ORDER — EPHEDRINE SULFATE 50 MG/ML IJ SOLN
INTRAMUSCULAR | Status: AC
  Filled 2017-12-26: qty 1

## 2017-12-26 MED ORDER — LIDOCAINE 2% (20 MG/ML) 5 ML SYRINGE
INTRAMUSCULAR | Status: AC
  Filled 2017-12-26: qty 5

## 2017-12-26 MED ORDER — METRONIDAZOLE 500 MG PO TABS: 500.0000 mg | ORAL_TABLET | Freq: Two times a day (BID) | ORAL | 0 refills | Status: DC

## 2017-12-26 MED ORDER — FENTANYL CITRATE (PF) 100 MCG/2ML IJ SOLN
INTRAMUSCULAR | Status: DC | PRN
  Administered 2017-12-26: 50 ug via INTRAVENOUS

## 2017-12-26 MED ORDER — PROPOFOL 10 MG/ML IV BOLUS
INTRAVENOUS | Status: DC | PRN
  Administered 2017-12-26: 110 mg via INTRAVENOUS

## 2017-12-26 MED ORDER — INSULIN ASPART 100 UNIT/ML ~~LOC~~ SOLN
3.0000 [IU] | Freq: Once | SUBCUTANEOUS | Status: AC
  Administered 2017-12-26: 3 [IU] via SUBCUTANEOUS

## 2017-12-26 MED ORDER — SODIUM CHLORIDE 0.9 % IV SOLN
INTRAVENOUS | Status: DC | PRN
  Administered 2017-12-26 (×2): via INTRAVENOUS

## 2017-12-26 MED ORDER — CEFAZOLIN SODIUM-DEXTROSE 2-4 GM/100ML-% IV SOLN
2.0000 g | INTRAVENOUS | Status: AC
  Administered 2017-12-26: 2 g via INTRAVENOUS
  Filled 2017-12-26: qty 100

## 2017-12-26 MED ORDER — ONDANSETRON HCL 4 MG/2ML IJ SOLN
INTRAMUSCULAR | Status: DC | PRN
  Administered 2017-12-26: 4 mg via INTRAVENOUS

## 2017-12-26 MED ORDER — OXYCODONE HCL 5 MG/5ML PO SOLN: 5.0000 mg | Freq: Once | ORAL | Status: DC | PRN

## 2017-12-26 MED ORDER — PHENYLEPHRINE 40 MCG/ML (10ML) SYRINGE FOR IV PUSH (FOR BLOOD PRESSURE SUPPORT)
PREFILLED_SYRINGE | INTRAVENOUS | Status: AC
  Filled 2017-12-26: qty 10

## 2017-12-26 MED ORDER — PROPOFOL 10 MG/ML IV BOLUS
INTRAVENOUS | Status: AC
  Filled 2017-12-26: qty 20

## 2017-12-26 MED ORDER — 0.9 % SODIUM CHLORIDE (POUR BTL) OPTIME
TOPICAL | Status: DC | PRN
  Administered 2017-12-26: 1000 mL

## 2017-12-26 MED ORDER — LIDOCAINE HCL (PF) 1 % IJ SOLN
INTRAMUSCULAR | Status: DC | PRN
  Administered 2017-12-26: 8 mL

## 2017-12-26 MED ORDER — LIDOCAINE HCL 1 % IJ SOLN
INTRAMUSCULAR | Status: AC
  Filled 2017-12-26: qty 20

## 2017-12-26 MED ORDER — ONDANSETRON HCL 4 MG/2ML IJ SOLN
INTRAMUSCULAR | Status: AC
  Filled 2017-12-26: qty 2

## 2017-12-26 MED ORDER — OXYCODONE HCL 5 MG PO TABS: 5.0000 mg | ORAL_TABLET | Freq: Once | ORAL | Status: DC | PRN

## 2017-12-26 MED ORDER — ONDANSETRON HCL 4 MG/2ML IJ SOLN: 4.0000 mg | Freq: Once | INTRAMUSCULAR | Status: DC | PRN

## 2017-12-26 MED ORDER — INSULIN ASPART 100 UNIT/ML ~~LOC~~ SOLN
SUBCUTANEOUS | Status: AC
  Administered 2017-12-26: 8 [IU] via SUBCUTANEOUS
  Filled 2017-12-26: qty 1

## 2017-12-26 MED ORDER — SODIUM CHLORIDE 0.9 % IJ SOLN
INTRAMUSCULAR | Status: AC
  Filled 2017-12-26: qty 10

## 2017-12-26 MED ORDER — FENTANYL CITRATE (PF) 100 MCG/2ML IJ SOLN: 25.0000 ug | INTRAMUSCULAR | Status: DC | PRN

## 2017-12-26 MED ORDER — CALCIUM ACETATE (PHOS BINDER) 667 MG PO CAPS: 1334.0000 mg | ORAL_CAPSULE | Freq: Three times a day (TID) | ORAL | 0 refills | Status: DC

## 2017-12-26 MED ORDER — LIDOCAINE HCL (CARDIAC) PF 100 MG/5ML IV SOSY
PREFILLED_SYRINGE | INTRAVENOUS | Status: DC | PRN
  Administered 2017-12-26: 20 mg via INTRAVENOUS

## 2017-12-26 MED ORDER — MIDAZOLAM HCL 2 MG/2ML IJ SOLN
INTRAMUSCULAR | Status: AC
  Filled 2017-12-26: qty 2

## 2017-12-26 MED ORDER — SODIUM CHLORIDE 0.9 % IV SOLN
INTRAVENOUS | Status: AC | PRN
  Administered 2017-12-26: 500 mL

## 2017-12-26 MED ORDER — FENTANYL CITRATE (PF) 250 MCG/5ML IJ SOLN
INTRAMUSCULAR | Status: AC
  Filled 2017-12-26: qty 5

## 2017-12-26 SURGICAL SUPPLY — 32 items
ADH SKN CLS APL DERMABOND .7 (GAUZE/BANDAGES/DRESSINGS) ×1
AGENT HMST SPONGE THK3/8 (HEMOSTASIS)
ARMBAND PINK RESTRICT EXTREMIT (MISCELLANEOUS) ×2 IMPLANT
CANISTER SUCT 3000ML PPV (MISCELLANEOUS) ×3 IMPLANT
CLIP VESOCCLUDE MED 6/CT (CLIP) ×1 IMPLANT
CLIP VESOCCLUDE SM WIDE 6/CT (CLIP) ×1 IMPLANT
COVER PROBE W GEL 5X96 (DRAPES) ×3 IMPLANT
DECANTER SPIKE VIAL GLASS SM (MISCELLANEOUS) ×1 IMPLANT
DERMABOND ADVANCED (GAUZE/BANDAGES/DRESSINGS) ×2
DERMABOND ADVANCED .7 DNX12 (GAUZE/BANDAGES/DRESSINGS) ×1 IMPLANT
ELECT REM PT RETURN 9FT ADLT (ELECTROSURGICAL) ×3
ELECTRODE REM PT RTRN 9FT ADLT (ELECTROSURGICAL) ×1 IMPLANT
GLOVE BIO SURGEON STRL SZ7 (GLOVE) ×3 IMPLANT
GLOVE BIOGEL PI IND STRL 7.5 (GLOVE) ×1 IMPLANT
GLOVE BIOGEL PI INDICATOR 7.5 (GLOVE) ×2
GOWN STRL REUS W/ TWL LRG LVL3 (GOWN DISPOSABLE) ×3 IMPLANT
GOWN STRL REUS W/TWL LRG LVL3 (GOWN DISPOSABLE) ×9
HEMOSTAT SPONGE AVITENE ULTRA (HEMOSTASIS) IMPLANT
KIT BASIN OR (CUSTOM PROCEDURE TRAY) ×3 IMPLANT
KIT TURNOVER KIT B (KITS) ×3 IMPLANT
NS IRRIG 1000ML POUR BTL (IV SOLUTION) ×3 IMPLANT
PACK CV ACCESS (CUSTOM PROCEDURE TRAY) ×3 IMPLANT
PAD ARMBOARD 7.5X6 YLW CONV (MISCELLANEOUS) ×6 IMPLANT
SUT MNCRL AB 3-0 PS2 18 (SUTURE) ×2 IMPLANT
SUT MNCRL AB 4-0 PS2 18 (SUTURE) ×3 IMPLANT
SUT PROLENE 6 0 BV (SUTURE) ×2 IMPLANT
SUT PROLENE 7 0 BV 1 (SUTURE) ×5 IMPLANT
SUT VIC AB 3-0 SH 27 (SUTURE) ×6
SUT VIC AB 3-0 SH 27X BRD (SUTURE) ×1 IMPLANT
TOWEL GREEN STERILE (TOWEL DISPOSABLE) ×3 IMPLANT
UNDERPAD 30X30 (UNDERPADS AND DIAPERS) ×3 IMPLANT
WATER STERILE IRR 1000ML POUR (IV SOLUTION) ×1 IMPLANT

## 2017-12-26 NOTE — Anesthesia Procedure Notes (Signed)
Procedure Name: LMA Insertion Date/Time: 12/26/2017 7:53 AM Performed by: Jenne Campus, CRNA Pre-anesthesia Checklist: Patient identified, Emergency Drugs available, Suction available and Patient being monitored Patient Re-evaluated:Patient Re-evaluated prior to induction Oxygen Delivery Method: Circle System Utilized Preoxygenation: Pre-oxygenation with 100% oxygen Induction Type: IV induction Ventilation: Mask ventilation without difficulty LMA: LMA inserted LMA Size: 4.0 Number of attempts: 1 Airway Equipment and Method: Bite block Placement Confirmation: positive ETCO2 and breath sounds checked- equal and bilateral Tube secured with: Tape Dental Injury: Teeth and Oropharynx as per pre-operative assessment

## 2017-12-26 NOTE — Care Management Note (Signed)
Case Management Note  Patient Details  Name: William [name] MRN: [id number] Date of Birth: [birth year]  Subjective/Objective:   From home with son, he will need a cane prior to dc.  Referral made to [facility] for cane .  RN notified that patient will need cane prior to dc.                  Action/Plan: DC home when ready.   Expected Discharge Date:  12/26/17               Expected Discharge Plan:  Home/Self Care  In-House Referral:  NA  Discharge planning Services  CM Consult  Post Acute Care Choice:  Durable Medical Equipment Choice offered to:  Patient  DME Arranged:  Kasandra Knudsen DME Agency:  [place] Arranged:  NA HH Agency:  NA  Status of Service:     If discussed at [place] of Stay Meetings, dates discussed:    Additional Comments:  Zenon Mayo, RN 12/26/2017, 4:07 PM

## 2017-12-26 NOTE — Anesthesia Preprocedure Evaluation (Addendum)
Anesthesia Evaluation  Patient identified by MRN, date of birth, ID band Patient awake    Reviewed: Allergy & Precautions, NPO status , Patient's Chart, lab work & pertinent test results  Airway Mallampati: II  TM Distance: >3 FB Neck ROM: Full    Dental  (+) Dental Advisory Given, Poor Dentition   Pulmonary    breath sounds clear to auscultation       Cardiovascular hypertension,  Rhythm:Regular Rate:Normal     Neuro/Psych    GI/Hepatic   Endo/Other  diabetes, Poorly Controlled, Type 2  Renal/GU Renal InsufficiencyRenal disease     Musculoskeletal   Abdominal   Peds  Hematology   Anesthesia Other Findings   Reproductive/Obstetrics                            Anesthesia Physical Anesthesia Plan  ASA: III  Anesthesia Plan: General   Post-op Pain Management:    Induction: Intravenous  PONV Risk Score and Plan: Ondansetron  Airway Management Planned: LMA  Additional Equipment:   Intra-op Plan:   Post-operative Plan:   Informed Consent: I have reviewed the patients History and Physical, chart, labs and discussed the procedure including the risks, benefits and alternatives for the proposed anesthesia with the patient or authorized representative who has indicated his/her understanding and acceptance.     Plan Discussed with: CRNA and Anesthesiologist  Anesthesia Plan Comments:         Anesthesia Quick Evaluation

## 2017-12-26 NOTE — Transfer of Care (Signed)
Immediate Anesthesia Transfer of Care Note  Patient: William [name]  Procedure(s) Performed: LEFT BRACHIOCEPHALIC ARTERIOVENOUS (AV) FISTULA CREATION (Left Arm Upper)  Patient Location: PACU  Anesthesia Type:General  Level of Consciousness: oriented, drowsy and patient cooperative  Airway & Oxygen Therapy: Patient Spontanous Breathing and Patient connected to face mask oxygen  Post-op Assessment: Report given to RN and Post -op Vital signs reviewed and stable  Post vital signs: Reviewed  Last Vitals:  Vitals Value Taken Time  BP 136/67 12/26/2017  9:26 AM  Temp    Pulse 68 12/26/2017  9:29 AM  Resp 8 12/26/2017  9:29 AM  SpO2 100 % 12/26/2017  9:29 AM  Vitals shown include unvalidated device data.  Last Pain:  Vitals:   12/26/17 0416  TempSrc: Oral  PainSc:       Patients Stated Pain Goal: 0 (99/71/82 0990)  Complications: No apparent anesthesia complications

## 2017-12-26 NOTE — Progress Notes (Signed)
[facility] Rounding Note  Background: 67 y.o. year-old hx of cirrhosis/ascites, DM2,  admitted April 2019 AKI on CKD, hemoptysis and lower ext edema.  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 Renal biopsy 11/17/17 ->diabetic GN w/ secondary FSGS and not enough glomeruli for  IF staining for anti-GBM Ab's (but showed no evidence of active GN).  Had temporary dialysis but renal function improved and was dc'd off dialysis. Dc'd on 4/16, readmitted 4/18 w/hemoptysis, had PLEX w/ FFP, re-pulsed with IV steroids. Creat peaked at 5.4 down to 3.78 on 4/27.  Seen by oncology 5/13 Dr Julien Nordmann for pancytopenia w/rec to continue Amicar but no need for continued Nplate.  Presented again  5/17 SOB, cough, hypoxemia.  CT chest showed findings suggesting bilat infiltrates possilbe edema. Rec'd high dose IV lasix.  Sbd distension but known hx of cirrhosis with ascites. Was started on IV abx as well for possible PNA. Diuresed net 3L.  Creat was 3.6 on admission, 3.9 5/18 and and 4.92 5/19 day of consult by Dr. Jonnie Finner.   Assessment/Recommendations  1. CKD stage V - no biopsy evidence that patient actually has/had renal GN.  Biopsy negative for active GN even though there inadequate tissue for IF.  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 Kidneys bilaterally small and echodense. Probably looking at long term effects HTN/DM - not active GN. Creat bumped ^ with diuresis x 2 days, another point arguing against simple vol overload. Renal fx sl better (creatinine down a hair) today  The only thing we have  going on for Goodpasture's is a low titer anti-GBM x 1 Anti GBM normal now.  1. S/p L BVT AVF Dr. Bridgett Larsson this AM   2. Still not clinically uremic (via son as Optometrist) despite bad numbers 3. I think from my standpoint could be discharged and see Dr. Posey Pronto in the office 4. Has  appt for Tuesday 12/30/17 arrive at 8AM to see Dr. Posey Pronto   2. Pulmonary infiltrates with prior hemoptysis - Infiltrates extensive and have never really subsided upon reviewing the last 6 months of CXR's and CT's. Clinically not significantly vol overloaded to account for the imaging changes.   1. Pulm recommends following off ATB's with repeat outpt imaging 2. Consider VATS/bx at some point 3. Taper steroids??  3. Cirrhosis w ascites 1. Had 3 L paracentesis today 5/24 (2nd one in 2 weeks)  4. DM2 insulin requiring  5. HTN on ccb/ coreg and diuretics at home. 1. Diuretics had been on hold. No edema, weight was up some, maybe few more crackles R side. Resumed 40 BID on 5/23   6. HCAP - cefipime stopped 7. Acute hypoxic resp failure - suspect more rel to infection, less so to volume 8. Diarrhea w/+ giardia, on po flagyl 9. dCHF - vol steady for now 10. Anemia - part of cytopenias. Iron studies are fine.   1. Added weekly Aranesp, dosed with 200 on 5/20 2. When we see in the office will decide on further dosing 11. CKD-MBD - PTH 123 no calcitriol needed.   1. Added ca acetate 5/21 for low ca and hyperphosphatemia  Jamal Maes, MD [facility] [phone] Pager 12/26/2017, 2:53 PM  Subjective/Interval History: Had AVF this AM, paracentesis this PM Son says he has no nausea, vomiting or SOB Labs not really a lot different over course of this week  Objective Vital signs in last 24 hours: Vitals:   12/26/17 1034 12/26/17 1129 12/26/17 1341 12/26/17 1406  BP:  (!) 146/79 (!) 149/77 140/70  Pulse: 72 78    Resp: 11 12    Temp:  98 F (36.7 C)    TempSrc:  Oral    SpO2: 97% 96%    Weight:      Height:       Weight change: 0.8 kg (1 lb 12.2 oz)  Intake/Output Summary (Last 24 hours) at 12/26/2017 1453 Last data filed at 12/26/2017 1406 Gross per 24 hour  Intake 1030 ml  Output 3111 ml  Net -2081 ml   Physical Exam:  Blood pressure 140/70, pulse 78, temperature  98 F (36.7 C), temperature source Oral, resp. rate 12, height 5' 5"  (1.651 m), weight 73.9 kg (162 lb 14.7 oz), SpO2 96 %.   Alert, no distress, calm and pleasant Non English speaking Smiling, O2 on, no ^ WOB No JVD Chest clear ant, ? Few R base crackles Regular rhythm S1S2 normal. No S3 Abd soft, not tender, moderate ascites  No LE edema No asterixus L upper arm AVF 2 incisions, good bruit over most medial\Incisions glued  Recent Labs  Lab 12/20/17 0458 12/21/17 0330 12/22/17 0324 12/23/17 0232 12/24/17 0254 12/25/17 0418 12/26/17 0332  NA 141 142 139 137 137 139 134*  K 5.3* 5.9* 4.0 3.8 3.8 4.1 4.6  CL 104 103 98* 99* 99* 101 97*  CO2 22 20* 23 24 24 25 23   GLUCOSE 128* 165* 506* 367* 221* 232* 384*  BUN 77* 83* 92* 93* 107* 112* 111*  CREATININE 3.99* 4.92* 4.81* 4.52* 4.64* 4.90* 4.74*  CALCIUM 6.5* 6.6* 6.4* 5.9* 6.5* 6.5* 6.5*  PHOS  --   --   --  7.0* 6.4* 5.6*  --     Recent Labs  Lab 12/21/17 0330 12/22/17 0324 12/23/17 0232 12/24/17 0254 12/25/17 0418  AST 36 25  --   --   --   ALT 21 22  --   --   --   ALKPHOS 60 61  --   --   --   BILITOT 2.9* 1.8*  --   --   --   PROT 5.2* 5.3*  --   --   --   ALBUMIN 2.7* 2.6* 2.5* 2.7* 2.7*   No results for input(s): LIPASE, AMYLASE in the last 168 hours.  Recent Labs  Lab 12/22/17 0324 12/23/17 0232 12/24/17 0254 12/26/17 0332  WBC 3.4* 3.5* 3.7* 4.6  HGB 7.9* 7.8* 8.5* 9.5*  HCT 24.3* 23.9* 25.9* 29.1*  MCV 77.6* 77.9* 77.3* 77.6*  PLT 49* 46* 43* 52*    Recent Labs  Lab 12/26/17 0723 12/26/17 0839 12/26/17 0929 12/26/17 1029 12/26/17 1118  GLUCAP 331* 316* 304* 287* 260*   Renal biopsy: 11/17/17 DIABETIC GLOMERULOPATHY IN ASSOCIATION WITH MODERATE TO SEVERE ARTERIONEPHROSCLEROSIS AND SECONDARY FOCAL AND SEGMENTAL GLOMERUSCLEROSIS. ACUTE TUBULAR INJURY. NO EVIDENCE OF AN ACTIVE GLOMERULONEPHRITIS.  PTH    Component Value Date/Time   PTH 123 (H) 12/23/2017 0232   Medications:  .  aminocaproic acid  500 mg Oral Q8H  . amLODipine  10 mg Oral Daily  . calcium acetate  1,334 mg Oral TID WC  . carvedilol  12.5 mg Oral BID WC  . darbepoetin (ARANESP) injection - NON-DIALYSIS  200 mcg Subcutaneous Q Mon-1800  . feeding supplement (PRO-STAT SUGAR FREE 64)  30 mL Oral BID  . ferrous gluconate  324 mg  Oral Q breakfast  . furosemide  40 mg Oral BID  . insulin aspart  0-15 Units Subcutaneous TID WC  . insulin aspart  3 Units Subcutaneous TID WC  . insulin detemir  10 Units Subcutaneous BID  . lidocaine      . metroNIDAZOLE  500 mg Oral Q12H  . [START ON 12/27/2017] predniSONE  10 mg Oral Q breakfast  . saccharomyces boulardii  250 mg Oral BID

## 2017-12-26 NOTE — Interval H&P Note (Signed)
   History and Physical Update  The patient was interviewed and re-examined.  The patient's previous History and Physical has been reviewed and is unchanged from Dr. Claretha Cooper consult.  There is no change in the plan of care: L arm AVF vs AVG.    BMP Latest Ref Rng & Units 12/26/2017 12/25/2017 12/24/2017  Glucose 65 - 99 mg/dL 384(H) 232(H) 221(H)  BUN 6 - 20 mg/dL 111(H) 112(H) 107(H)  Creatinine 0.61 - 1.24 mg/dL 4.74(H) 4.90(H) 4.64(H)  BUN/Creat Ratio 10 - 24 - - -  Sodium 135 - 145 mmol/L 134(L) 139 137  Potassium 3.5 - 5.1 mmol/L 4.6 4.1 3.8  Chloride 101 - 111 mmol/L 97(L) 101 99(L)  CO2 22 - 32 mmol/L 23 25 24   Calcium 8.9 - 10.3 mg/dL 6.5(L) 6.5(L) 6.5(L)    Risk, benefits, and alternatives to access surgery were discussed.    The patient is aware the risks include but are not limited to: bleeding, infection, steal syndrome, nerve damage, ischemic monomelic neuropathy, thrombosis, failure to mature, complications related to venous hypertension, need for additional procedures, death and stroke.    We discussed staged transposition procedures require TWO operations minimally.  The patient agrees to proceed forward with the procedure.    Adele Barthel, MD, FACS [facility] Office: [phone] Pager: [phone]  12/26/2017, 7:01 AM

## 2017-12-26 NOTE — Procedures (Signed)
PROCEDURE SUMMARY:  Successful US guided paracentesis from left lateral abdomen.  Yielded 3.1 liters of clear, yellow fluid.  No immediate complications.  Pt tolerated well.   Specimen was sent for labs.  Docia Barrier PA-C 12/26/2017 2:14 PM

## 2017-12-26 NOTE — Op Note (Signed)
OPERATIVE NOTE   PROCEDURE: 1. left first stage basilic vein transposition (brachiobasilic arteriovenous fistula) placement  PRE-OPERATIVE DIAGNOSIS: acute kidney failure  POST-OPERATIVE DIAGNOSIS: same as above   SURGEON: Adele Barthel, MD  ASSISTANT(S): Laurence Slate, PAC   ANESTHESIA: general  ESTIMATED BLOOD LOSS: 30 cc  FINDING(S): 1.  Basilic vein: 3-4 mm, acceptable 2.  Brachial artery: 4 mm, minimal wall disease 3.  Venous outflow: palpable thrill  4.  Radial flow: palpable radial pulse  SPECIMEN(S):  none  INDICATIONS:   William [name] is a 67 y.o. male who presents with acute kidney failure.  The patient is scheduled for left first stage basilic vein transposition.  The patient is aware the risks include but are not limited to: bleeding, infection, steal syndrome, nerve damage, ischemic monomelic neuropathy, failure to mature, and need for additional procedures.  The patient is aware of the risks of the procedure and elects to proceed forward.   DESCRIPTION: After full informed written consent was obtained from the patient, the patient was brought back to the operating room and placed supine upon the operating table.  Prior to induction, the patient received IV antibiotics.   After obtaining adequate anesthesia, the patient was then prepped and draped in the standard fashion for a left arm access procedure.  I turned my attention first to identifying the patient's basilic vein and brachial artery.    Using SonoSite guidance, the location of these vessels were marked out on the skin.   I made a longitudinal incision at the level of the antecubitum and dissected through the subcutaneous tissue and fascia to gain exposure of the brachial artery.  This was noted to be 4 mm in diameter externally.  I then made another longitudinal incision over the basilic vein.  This was dissected out proximally and distally and controlled with vessel loops .  I then dissected out the basilic  vein.  This was noted to be 3-4 mm in diameter externally.  The distal segment of the vein was ligated with a  2-0 silk, and the vein was transected.  The proximal segment was interrogated with serial dilators.  The vein accepted up to a 3 mm dilator without any difficulty.  I then instilled the heparinized saline into the vein and clamped it.  At this point, I reset my exposure of the brachial artery and placed the artery under tension proximally and distally.  I made an arteriotomy with a #11 blade, and then I extended the arteriotomy with a Potts scissor.  I injected heparinized saline proximal and distal to this arteriotomy.  The vein was then sewn to the artery in an end-to-side configuration with a running stitch of 7-0 Prolene.  Prior to completing this anastomosis, I allowed the vein and artery to backbleed.  There was no evidence of clot from any vessels.  I completed the anastomosis in the usual fashion and then released all vessel loops and clamps.    There was a palpable thrill in the venous outflow, and there was a palpable radial pulse.  At this point, I irrigated out the surgical wound.  There was no further active bleeding.  The subcutaneous tissue was reapproximated with a running stitch of 3-0 Vicryl.  The skin was then reapproximated with a running subcuticular stitch of 4-0 Vicryl.  The skin was then cleaned, dried, and reinforced with Dermabond.  The patient tolerated this procedure well.    COMPLICATIONS: none  CONDITION: stable   Adele Barthel, MD, FACS  [facility] Office: [phone] Pager: [phone]  12/26/2017, 9:08 AM

## 2017-12-26 NOTE — Anesthesia Postprocedure Evaluation (Signed)
Anesthesia Post Note  Patient: Benny Hardebeck  Procedure(s) Performed: LEFT BRACHIOCEPHALIC ARTERIOVENOUS (AV) FISTULA CREATION (Left Arm Upper)     Patient location during evaluation: PACU Anesthesia Type: General Level of consciousness: awake and alert Pain management: pain level controlled Vital Signs Assessment: post-procedure vital signs reviewed and stable Respiratory status: spontaneous breathing, nonlabored ventilation, respiratory function stable and patient connected to nasal cannula oxygen Cardiovascular status: blood pressure returned to baseline and stable Postop Assessment: no apparent nausea or vomiting Anesthetic complications: no    Last Vitals:  Vitals:   12/26/17 1030 12/26/17 1034  BP:    Pulse: 72 72  Resp: 15 11  Temp: 36.5 C   SpO2: 98% 97%    Last Pain:  Vitals:   12/26/17 0416  TempSrc: Oral  PainSc:                  Dillinger Aston COKER

## 2017-12-26 NOTE — Discharge Summary (Addendum)
Physician Discharge Summary  Araf Clugston VQQ:[id number] DOB: [birth year] DOA: 12/19/2017  PCP: Gildardo Pounds, NP  Admit date: 12/19/2017 Discharge date: 12/26/2017  Admitted From: Home Disposition:  Home  Recommendations for Outpatient Follow-up:  1. Follow up with PCP in 1-2 weeks 2. Follow up with Nephrology as scheduled on 12/30/17 3. Follow up with Dr. Elsworth Soho as scheduled  Discharge Condition:Improved CODE STATUS:Full Diet recommendation: Diabetic   Brief/Interim Summary: 67 year old male who presented with dyspnea and cough. He does have significant past medical history forGoodpasturesyndrome, pancytopenia, cirrhosis,chronic kidney disease stage IV, type 2 diabetes mellitus and heart failure with preserved ejection fraction.Patient had recent hospitalizations due to renal failure and decompensated liver disease. On day of admission apparently patient had dyspnea and fevers for 24 hours. On initial physical examination blood pressure 157/76, heart rate 94, respiratory 24, oxygen saturation 95%.Moist mucous membranes, lungs scattered rhonchi bilaterally, decreased breath sounds at bases, possibly rales, poor respiratory effort, heart S1-S2 present and rhythmic, no gallops or gallops, positive systolic murmur, abdomen severely distended but nontender, positive pitting lower extremity edema bilaterally.Sodium 141, potassium 5.4, chloride 104, bicarbonate 25, glucose 64, BUN 81, creatinine 4.16, white count 7.3, 1110.9, hematocrit 33.8,platelets 89.Chest x-ray with alveolar infiltrates in the right upper and lower lobe, CT chest with bilateral centralupper lobesalveolar infiltrates, bibasilar atelectasis.  Patient was admitted to the hospital working diagnosis of acute hypoxic respiratory failure, torule out healthcare associatedpneumonia.   1.Acuteon chronichypoxic respiratory failure .Stable infiltrates noted,continuebronchodilator therapy, oxymetry monitoring and  supplemental oxygen per nasal cannula as tolerated.Marland Kitchen  Antibiotics remain on hold. Hb has remained stable, no clinical sings of alveolar hemorrhage.  Patient was recently plan for outpatient bronchoscopy pending improvement of thrombocytopenia. Appreciate input by Pulmonary. Recommendation to wean steroids over the next several days. Repeat chest imaging as outpatient with follow up with Dr. Elsworth Soho  2.Chronic kidney disease stage V.  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 Pt underwent HD access (fistula) placement 12/26/17 (L BVT AVF by Dr. Bridgett Larsson).  To follow-up closely with nephrology on 12/30/2017  3.Chronic liver failure.  Patient alert and oriented, not encephalopathic. Family reports increased abdominal girth.  Abdominal ultrasound demonstrated mild to moderate ascites.  Patient was otherwise asymptomatic.  I discussed case with patient's primary gastroenterologist at [place], recommendation for in-hospital centesis.  Patient has since undergone paracentesis per IR on 12/26/2017 yielding 3.1 L of ascitic fluid.  Fluid was sent for analysis, currently pending.  4.Type 2 diabetes mellituswith uncontrolled hyperglyecmia.  Patient noted to have poorly controlled sugars recently which prompted delay of dialysis access placement.  Have since increased basal insulin to 10 units bid and will decreased steroid dose as per above. Glucose in the 100's.  Patient to resume home diabetes regimen on discharge  5.Hypertension.Continue withamlodipine andcarvedilol,for blood pressure control.  Remains stable at present  6. Thrombocytopenia.No signs of active bleeding.  Suspicion for reactive thrombocytopenia in the setting of liver disease. Plan this improved to the 50s  7. Goodpasture syndrome.  Patient noted to have low positive  anti GBM, ANCA negative.Patient is sp pulse steroids and cytoxan. Renal biopsy did not confirm GPS.Will continue supportive medical therapy.    No need for steroids per both nephrology as well as pulmonary.  Will wean steroids over the next several days.  8. Giardiainduced diarrhea.Currently on POmetronidazole, diarrhea is improving, out of bed as tolerated and physical therapy evaluation. Patient to complete course  of antibiotics.  9 Pneumonia with sepsis present on admission  Discharge Diagnoses:  Active Problems:   Type II diabetes mellitus with renal manifestations (HCC)   Acute respiratory failure with hypoxia (HCC)   Pancytopenia (HCC)   Essential hypertension   Goodpasture's syndrome (HCC) with associated hemoptysis   Cirrhosis of liver with ascites (HCC)   CKD (chronic kidney disease) stage 4, GFR 15-29 ml/min (HCC)   Hyperkalemia   HCAP (healthcare-associated pneumonia)   Ascites    Discharge Instructions   Allergies as of 12/26/2017      Reactions   Heparin    Patient is Muslim and is not permitted Pork derative due to religious belief)      Medication List    STOP taking these medications   dextromethorphan-guaiFENesin 30-600 MG 12hr tablet Commonly known as:  MUCINEX DM   sodium bicarbonate 650 MG tablet   spironolactone 50 MG tablet Commonly known as:  ALDACTONE     TAKE these medications   acetaminophen 650 MG CR tablet Commonly known as:  TYLENOL Take 650 mg by mouth every 8 (eight) hours as needed for pain.   aminocaproic acid 500 MG tablet Commonly known as:  AMICAR Take 1 tablet (500 mg total) by mouth every 8 (eight) hours.   amLODipine 10 MG tablet Commonly known as:  NORVASC Take 1 tablet (10 mg total) daily by mouth.   calcium acetate 667 MG capsule Commonly known as:  PHOSLO Take 2 capsules (1,334 mg total) by mouth 3 (three) times daily with meals.   carvedilol 12.5 MG tablet Commonly known as:  COREG Take 12.5 mg by mouth 2  (two) times daily with a meal.   furosemide 40 MG tablet Commonly known as:  LASIX Take 1 tablet (40 mg total) by mouth 2 (two) times daily.   insulin NPH-regular Human (70-30) 100 UNIT/ML injection Commonly known as:  NOVOLIN 70/30 Inject 18 Units into the skin 2 (two) times daily with a meal.   IRON 27 240 (27 FE) MG tablet Generic drug:  ferrous gluconate Take 240 mg by mouth daily.   metroNIDAZOLE 500 MG tablet Commonly known as:  FLAGYL Take 1 tablet (500 mg total) by mouth every 12 (twelve) hours.   multivitamin with minerals tablet Take 1 tablet by mouth daily.   predniSONE 10 MG tablet Commonly known as:  DELTASONE Take 1 tablet (10 mg total) by mouth daily with breakfast. Start taking on:  12/27/2017 What changed:    medication strength  how much to take       Allergies  Allergen Reactions  . Heparin     Patient is Muslim and is not permitted Pork derative due to religious belief)    Consultations:  Nephrology  Vascular surgery  Discussed case with [facility] gastroenterology  Procedures/Studies: Ct Abdomen Pelvis Wo Contrast  Result Date: 12/19/2017 CLINICAL DATA:  67 year old male with shortness of breath, fever and abdominal distension. History of cirrhosis. EXAM: CT CHEST, ABDOMEN AND PELVIS WITHOUT CONTRAST TECHNIQUE: Multidetector CT imaging of the chest, abdomen and pelvis was performed following the standard protocol without IV contrast. COMPARISON:  12/06/2017 abdominal/pelvic CT and 11/20/2017 chest CT FINDINGS: Please note that parenchymal abnormalities may be missed without intravenous contrast. CT CHEST FINDINGS Cardiovascular: Cardiomegaly and coronary/thoracic aortic atherosclerotic calcifications again noted. A small pericardial effusion is unchanged. There is no evidence of thoracic aortic aneurysm. Mediastinum/Nodes: No enlarged mediastinal, hilar, or axillary lymph nodes. Thyroid gland, trachea, and esophagus demonstrate no significant  findings. Lungs/Pleura: Diffuse  bilateral airspace opacities, primarily central, are noted likely representing edema but infection/superimposed infection is not excluded. Small bilateral pleural effusions are noted. Bibasilar atelectasis identified. There is no evidence of pneumothorax. Musculoskeletal: No acute abnormality CT ABDOMEN PELVIS FINDINGS Hepatobiliary: Cirrhosis changes identified. No gross focal hepatic abnormality noted. No definite gallbladder abnormality. No biliary dilatation. Pancreas: Unremarkable Spleen: Splenomegaly again identified. Adrenals/Urinary Tract: The kidneys, adrenal glands and bladder are unremarkable. Stomach/Bowel: Stomach is within normal limits. Appendix is unremarkable. No evidence of bowel wall thickening, distention, or inflammatory changes. Vascular/Lymphatic: Aortic atherosclerosis. No enlarged abdominal or pelvic lymph nodes. Reproductive: Prostate enlargement again noted Other: A moderate to large amount of ascites is again noted and relatively unchanged. No evidence of pneumoperitoneum. Subcutaneous edema identified. Musculoskeletal: No acute or suspicious bony abnormalities. IMPRESSION: 1. Diffuse bilateral airspace opacities likely representing pulmonary edema. Infection/superimposed infection not excluded. 2. Moderate to large amount of ascites again noted with small bilateral pleural effusions and diffuse subcutaneous edema. 3. Cirrhosis and splenomegaly. 4. Cardiomegaly and small pericardial effusion. 5. Coronary artery and Aortic Atherosclerosis (ICD10-I70.0). 6. Prostatomegaly. Electronically Signed   By: Margarette Canada M.D.   On: 12/19/2017 17:44   Ct Abdomen Pelvis Wo Contrast  Result Date: 12/06/2017 CLINICAL DATA:  Abdominal distention and diarrhea. EXAM: CT ABDOMEN AND PELVIS WITHOUT CONTRAST TECHNIQUE: Multidetector CT imaging of the abdomen and pelvis was performed following the standard protocol without IV contrast. COMPARISON:  Abdominal ultrasound dated  September 04, 2017. FINDINGS: Lower chest: Bronchiectasis and scarring at both lung bases. Mild interlobular septal thickening. Hepatobiliary: Small, nodular liver with prominent periportal edema. No focal liver abnormality. The gallbladder is unremarkable. Pancreas: Atrophic. No ductal dilatation or surrounding inflammatory changes. Spleen: Moderately enlarged.  No focal abnormality. Adrenals/Urinary Tract: Adrenal glands are unremarkable. Moderate bilateral renal atrophy. No renal or ureteral calculi. No hydronephrosis. Stomach/Bowel: Stomach is within normal limits. Appendix appears normal. No evidence of bowel wall thickening, distention, or inflammatory changes. Vascular/Lymphatic: Splenic and possibly gastric varices. No enlarged abdominal or pelvic lymph nodes. Reproductive: Mild prostatomegaly. Other: Moderate ascites.  No pneumoperitoneum. Musculoskeletal: No acute or significant osseous findings. IMPRESSION: 1. Cirrhosis with sequelae of portal hypertension including moderate splenomegaly and ascites. Splenic and possibly gastric varices. 2. Moderate bilateral renal atrophy. 3. Bronchiectasis and scarring at both lung bases, likely postinflammatory/postinfectious. 4. Mild pulmonary interstitial edema at the lung bases. Electronically Signed   By: Titus Dubin M.D.   On: 12/06/2017 18:16   Dg Chest 2 View  Result Date: 12/22/2017 CLINICAL DATA:  Shortness of breath EXAM: CHEST - 2 VIEW COMPARISON:  Chest radiograph and chest CT Dec 19, 2017 FINDINGS: There is patchy interstitial and alveolar edema throughout the lungs, essentially stable compared to the recent prior study. Heart is enlarged with pulmonary venous hypertension. No adenopathy. There is aortic atherosclerosis. No bone lesions. IMPRESSION: Pulmonary vascular congestion with interstitial and alveolar edema. Likely congestive heart failure. A degree of pneumonia and/or ARDS superimposed cannot be excluded radiographically. Appearance stable  compared to recent prior studies. Electronically Signed   By: Lowella Grip III M.D.   On: 12/22/2017 09:31   Dg Chest 2 View  Result Date: 12/19/2017 CLINICAL DATA:  SOB/hypoxia since this morning - hx of CHF, htn, diabetes, cirrhosis, kidney disease, fluid removal EXAM: CHEST - 2 VIEW COMPARISON:  11/19/2017 FINDINGS: The heart is mildly enlarged. There are prominent opacities throughout the lungs, similar in appearance to the previous exams. No new consolidations or pleural effusions. RIGHT central line has been removed. IMPRESSION:  Persistent bilateral lung opacities. Electronically Signed   By: Nolon Nations M.D.   On: 12/19/2017 16:10   Dg Abdomen 1 View  Result Date: 12/03/2017 CLINICAL DATA:  Hematemesis. Constipation, abdominal pain and diarrhea. EXAM: ABDOMEN - 1 VIEW COMPARISON:  Abdominal radiograph November 26, 2017 FINDINGS: Paucity of large bowel gas. Multiple loops of nondistended gas-filled small bowel somewhat centrally displaced seen with ascites. Hazy appearance the abdomen. No radio-opaque calculi or other significant radiographic abnormality are seen. IMPRESSION: Nonspecific bowel gas pattern. Hazy appearance of abdomen seen with ascites. Electronically Signed   By: Elon Alas M.D.   On: 12/03/2017 16:15   Ct Chest Wo Contrast  Result Date: 12/19/2017 CLINICAL DATA:  67 year old male with shortness of breath, fever and abdominal distension. History of cirrhosis. EXAM: CT CHEST, ABDOMEN AND PELVIS WITHOUT CONTRAST TECHNIQUE: Multidetector CT imaging of the chest, abdomen and pelvis was performed following the standard protocol without IV contrast. COMPARISON:  12/06/2017 abdominal/pelvic CT and 11/20/2017 chest CT FINDINGS: Please note that parenchymal abnormalities may be missed without intravenous contrast. CT CHEST FINDINGS Cardiovascular: Cardiomegaly and coronary/thoracic aortic atherosclerotic calcifications again noted. A small pericardial effusion is unchanged.  There is no evidence of thoracic aortic aneurysm. Mediastinum/Nodes: No enlarged mediastinal, hilar, or axillary lymph nodes. Thyroid gland, trachea, and esophagus demonstrate no significant findings. Lungs/Pleura: Diffuse bilateral airspace opacities, primarily central, are noted likely representing edema but infection/superimposed infection is not excluded. Small bilateral pleural effusions are noted. Bibasilar atelectasis identified. There is no evidence of pneumothorax. Musculoskeletal: No acute abnormality CT ABDOMEN PELVIS FINDINGS Hepatobiliary: Cirrhosis changes identified. No gross focal hepatic abnormality noted. No definite gallbladder abnormality. No biliary dilatation. Pancreas: Unremarkable Spleen: Splenomegaly again identified. Adrenals/Urinary Tract: The kidneys, adrenal glands and bladder are unremarkable. Stomach/Bowel: Stomach is within normal limits. Appendix is unremarkable. No evidence of bowel wall thickening, distention, or inflammatory changes. Vascular/Lymphatic: Aortic atherosclerosis. No enlarged abdominal or pelvic lymph nodes. Reproductive: Prostate enlargement again noted Other: A moderate to large amount of ascites is again noted and relatively unchanged. No evidence of pneumoperitoneum. Subcutaneous edema identified. Musculoskeletal: No acute or suspicious bony abnormalities. IMPRESSION: 1. Diffuse bilateral airspace opacities likely representing pulmonary edema. Infection/superimposed infection not excluded. 2. Moderate to large amount of ascites again noted with small bilateral pleural effusions and diffuse subcutaneous edema. 3. Cirrhosis and splenomegaly. 4. Cardiomegaly and small pericardial effusion. 5. Coronary artery and Aortic Atherosclerosis (ICD10-I70.0). 6. Prostatomegaly. Electronically Signed   By: Margarette Canada M.D.   On: 12/19/2017 17:44   Dg Esophagus  Result Date: 12/25/2017 CLINICAL DATA:  Aspiration pneumonia EXAM: ESOPHOGRAM/BARIUM SWALLOW TECHNIQUE: Single  contrast examination was performed using  thin barium. FLUOROSCOPY TIME:  Fluoroscopy Time:  0 minutes 59 seconds Radiation Exposure Index (if provided by the fluoroscopic device): Number of Acquired Spot Images: 0 COMPARISON:  CT chest 12/19/2017 FINDINGS: Negative for aspiration. Moderate decrease in esophageal peristalsis. Negative for hiatal hernia. No stricture or mass 2 cm esophageal diverticulum, just above the carina. This has smooth margins and appears chronic. No significant associated mucosal edema. IMPRESSION: Negative for aspiration Esophageal dysmotility 2 cm soft the geode diverticulum above the carina likely an incidental finding. Electronically Signed   By: Franchot Gallo M.D.   On: 12/25/2017 10:57   US Abdomen Limited  Result Date: 12/25/2017 CLINICAL DATA:  Abdominal distension, check for ascites EXAM: LIMITED ABDOMEN ULTRASOUND FOR ASCITES TECHNIQUE: Limited ultrasound survey for ascites was performed in all four abdominal quadrants. COMPARISON:  CT 12/19/2017 FINDINGS: Mild to moderate ascites  within the abdomen with large is fluid pocket seen in the right lower quadrant. Cirrhotic morphology of the liver IMPRESSION: Mild to moderate abdominal ascites Electronically Signed   By: Donavan Foil M.D.   On: 12/25/2017 22:34   Ir Removal Tun Cv Cath W/o Fl  Result Date: 11/28/2017 INDICATION: History of Goodpasture's disease. Status post completion of plasma exchange therapy. Request removal of tunneled plasmapheresis catheter. Catheter originally placed on 11/11/2017. EXAM: REMOVAL OF TUNNELED PLASMAPHERESIS CATHETER MEDICATIONS: None COMPLICATIONS: None immediate. PROCEDURE: Informed written consent was obtained from the patient following an explanation of the procedure, risks, benefits and alternatives to treatment. A time out was performed prior to the initiation of the procedure. Maximal barrier sterile technique was utilized including caps, mask, sterile gowns, sterile gloves, large  sterile drape, hand hygiene, and chlorhexidine. Utilizing a combination of blunt dissection and gentle traction, the catheter was removed intact. Hemostasis was obtained with manual compression. A dressing was placed. The patient tolerated the procedure well without immediate post procedural complication. IMPRESSION: Successful removal of tunneled plasmapheresis catheter. Read by: Ascencion Dike PA-C Electronically Signed   By: Lucrezia Europe M.D.   On: 11/28/2017 14:30   Ir Paracentesis  Result Date: 12/26/2017 INDICATION: Patient with history of cirrhosis, abdominal distention. Request is made for diagnostic and therapeutic paracentesis. EXAM: ULTRASOUND GUIDED DIAGNOSTIC AND THERAPEUTIC PARACENTESIS MEDICATIONS: 10 mL 2% lidocaine COMPLICATIONS: None immediate. PROCEDURE: Informed written consent was obtained from the patient after a discussion of the risks, benefits and alternatives to treatment. A timeout was performed prior to the initiation of the procedure. Initial ultrasound scanning demonstrates a moderate amount of ascites within the left lateral abdomen. The left lateral abdomen was prepped and draped in the usual sterile fashion. 2% lidocaine was used for local anesthesia. Following this, a 19 gauge, 7-cm, Yueh catheter was introduced. An ultrasound image was saved for documentation purposes. The paracentesis was performed. The catheter was removed and a dressing was applied. The patient tolerated the procedure well without immediate post procedural complication. FINDINGS: A total of approximately 3.1 liters of clear, yellow fluid was removed. Samples were sent to the laboratory as requested by the clinical team. IMPRESSION: Successful ultrasound-guided diagnostic and therapeutic paracentesis yielding 3.1 liters of peritoneal fluid. Read by: Brynda Greathouse PA-C Electronically Signed   By: Corrie Mckusick D.O.   On: 12/26/2017 14:18   Ir Paracentesis  Result Date: 12/10/2017 INDICATION: Patient with  abdominal distention, ascites. Request is made for diagnostic and therapeutic paracentesis. EXAM: ULTRASOUND GUIDED DIAGNOSTIC AND THERAPEUTIC PARACENTESIS MEDICATIONS: 10 mL 2% lidocaine COMPLICATIONS: None immediate. PROCEDURE: Informed written consent was obtained from the patient after a discussion of the risks, benefits and alternatives to treatment. A timeout was performed prior to the initiation of the procedure. Initial ultrasound scanning demonstrates a moderate amount of ascites within the right lower abdominal quadrant. The right lower abdomen was prepped and draped in the usual sterile fashion. 2% lidocaine was used for local anesthesia. Following this, a 19 gauge, 7-cm, Yueh catheter was introduced. An ultrasound image was saved for documentation purposes. The paracentesis was performed. The catheter was removed and a dressing was applied. The patient tolerated the procedure well without immediate post procedural complication. FINDINGS: A total of approximately 3.8 liters of yellow, cloudy fluid was removed. Samples were sent to the laboratory as requested by the clinical team. IMPRESSION: Successful ultrasound-guided diagnostic and therapeutic paracentesis yielding 3.8 liters of peritoneal fluid. Read by: Brynda Greathouse PA-C Electronically Signed   By: Arnell Sieving  Hoss M.D.   On: 12/10/2017 11:38    Subjective: Eager to go home today  Discharge Exam: Vitals:   12/26/17 1341 12/26/17 1406  BP: (!) 149/77 140/70  Pulse:    Resp:    Temp:    SpO2:     Vitals:   12/26/17 1034 12/26/17 1129 12/26/17 1341 12/26/17 1406  BP:  (!) 146/79 (!) 149/77 140/70  Pulse: 72 78    Resp: 11 12    Temp:  98 F (36.7 C)    TempSrc:  Oral    SpO2: 97% 96%    Weight:      Height:        General: Pt is alert, awake, not in acute distress Cardiovascular: RRR, S1/S2 +, no rubs, no gallops Respiratory: CTA bilaterally, no wheezing, no rhonchi Abdominal: Soft, NT, ND, bowel sounds + Extremities: no  edema, no cyanosis   The results of significant diagnostics from this hospitalization (including imaging, microbiology, ancillary and laboratory) are listed below for reference.     Microbiology: Recent Results (from the past 240 hour(s))  Blood Culture (routine x 2)     Status: None   Collection Time: 12/19/17  2:10 PM  Result Value Ref Range Status   Specimen Description BLOOD RIGHT ANTECUBITAL  Final   Special Requests AEROBIC BOTTLE ONLY Blood Culture adequate volume  Final   Culture   Final    NO GROWTH 5 DAYS Performed at [facility], [address], [place], [place] [address]    Report Status 12/24/2017 FINAL  Final  Blood Culture (routine x 2)     Status: None   Collection Time: 12/19/17  4:32 PM  Result Value Ref Range Status   Specimen Description BLOOD BLOOD RIGHT HAND  Final   Special Requests   Final    BOTTLES DRAWN AEROBIC AND ANAEROBIC Blood Culture adequate volume   Culture   Final    NO GROWTH 5 DAYS Performed at [facility], [place] [address] [place], [place] [address]    Report Status 12/24/2017 FINAL  Final  Gastrointestinal Panel by PCR , Stool     Status: Abnormal   Collection Time: 12/19/17  6:47 PM  Result Value Ref Range Status   Campylobacter species NOT DETECTED NOT DETECTED Final   Plesimonas shigelloides NOT DETECTED NOT DETECTED Final   Salmonella species NOT DETECTED NOT DETECTED Final   Yersinia enterocolitica NOT DETECTED NOT DETECTED Final   Vibrio species NOT DETECTED NOT DETECTED Final   Vibrio cholerae NOT DETECTED NOT DETECTED Final   Enteroaggregative E coli (EAEC) NOT DETECTED NOT DETECTED Final   Enteropathogenic E coli (EPEC) NOT DETECTED NOT DETECTED Final   Enterotoxigenic E coli (ETEC) NOT DETECTED NOT DETECTED Final   Shiga like toxin producing E coli (STEC) NOT DETECTED NOT DETECTED Final   E. coli O157 NOT DETECTED NOT DETECTED Final   Shigella/Enteroinvasive E coli (EIEC) NOT DETECTED NOT DETECTED Final    Cryptosporidium NOT DETECTED NOT DETECTED Final   Cyclospora cayetanensis NOT DETECTED NOT DETECTED Final   Entamoeba histolytica NOT DETECTED NOT DETECTED Final   Giardia lamblia DETECTED (A) NOT DETECTED Final   Adenovirus F40/41 NOT DETECTED NOT DETECTED Final   Astrovirus NOT DETECTED NOT DETECTED Final   Norovirus GI/GII NOT DETECTED NOT DETECTED Final   Rotavirus A NOT DETECTED NOT DETECTED Final   Sapovirus (I, II, IV, and V) NOT DETECTED NOT DETECTED Final    Comment: Performed at [facility]  [facility], [place], [place] [address]  C difficile quick scan w PCR reflex     Status: None   Collection Time: 12/19/17  6:47 PM  Result Value Ref Range Status   C Diff antigen NEGATIVE NEGATIVE Final   C Diff toxin NEGATIVE NEGATIVE Final   C Diff interpretation No C. difficile detected.  Final    Comment: Performed at [facility], [place] [address] [place], [place] [address]  Respiratory Panel by PCR     Status: None   Collection Time: 12/19/17 11:16 PM  Result Value Ref Range Status   Adenovirus NOT DETECTED NOT DETECTED Final   Coronavirus 229E NOT DETECTED NOT DETECTED Final   Coronavirus HKU1 NOT DETECTED NOT DETECTED Final   Coronavirus NL63 NOT DETECTED NOT DETECTED Final   Coronavirus OC43 NOT DETECTED NOT DETECTED Final   Metapneumovirus NOT DETECTED NOT DETECTED Final   Rhinovirus / Enterovirus NOT DETECTED NOT DETECTED Final   Influenza A NOT DETECTED NOT DETECTED Final   Influenza B NOT DETECTED NOT DETECTED Final   Parainfluenza Virus 1 NOT DETECTED NOT DETECTED Final   Parainfluenza Virus 2 NOT DETECTED NOT DETECTED Final   Parainfluenza Virus 3 NOT DETECTED NOT DETECTED Final   Parainfluenza Virus 4 NOT DETECTED NOT DETECTED Final   Respiratory Syncytial Virus NOT DETECTED NOT DETECTED Final   Bordetella pertussis NOT DETECTED NOT DETECTED Final   Chlamydophila pneumoniae NOT DETECTED NOT DETECTED Final   Mycoplasma pneumoniae NOT DETECTED NOT  DETECTED Final  MRSA PCR Screening     Status: None   Collection Time: 12/20/17  7:51 PM  Result Value Ref Range Status   MRSA by PCR NEGATIVE NEGATIVE Final    Comment:        The GeneXpert MRSA Assay (FDA approved for NASAL specimens only), is one component of a comprehensive MRSA colonization surveillance program. It is not intended to diagnose MRSA infection nor to guide or monitor treatment for MRSA infections. Performed at [facility], [place] [address], [place],  [address]      Labs: BNP (last 3 results) Recent Labs    11/06/17 1713 12/19/17 1414  BNP 758.5* 280.0*   Basic Metabolic Panel: Recent Labs  Lab 12/22/17 0324 12/23/17 0232 12/24/17 0254 12/25/17 0418 12/26/17 0332  NA 139 137 137 139 134*  K 4.0 3.8 3.8 4.1 4.6  CL 98* 99* 99* 101 97*  CO2 23 24 24 25 23   GLUCOSE 506* 367* 221* 232* 384*  BUN 92* 93* 107* 112* 111*  CREATININE 4.81* 4.52* 4.64* 4.90* 4.74*  CALCIUM 6.4* 5.9* 6.5* 6.5* 6.5*  PHOS  --  7.0* 6.4* 5.6*  --    Liver Function Tests: Recent Labs  Lab 12/21/17 0330 12/22/17 0324 12/23/17 0232 12/24/17 0254 12/25/17 0418  AST 36 25  --   --   --   ALT 21 22  --   --   --   ALKPHOS 60 61  --   --   --   BILITOT 2.9* 1.8*  --   --   --   PROT 5.2* 5.3*  --   --   --   ALBUMIN 2.7* 2.6* 2.5* 2.7* 2.7*   No results for input(s): LIPASE, AMYLASE in the last 168 hours. No results for input(s): AMMONIA in the last 168 hours. CBC: Recent Labs  Lab 12/21/17 0556 12/22/17 0324 12/23/17 0232 12/24/17 0254 12/26/17 0332  WBC 2.5* 3.4* 3.5* 3.7* 4.6  HGB 8.5* 7.9* 7.8* 8.5* 9.5*  HCT 26.6* 24.3* 23.9* 25.9* 29.1*  MCV 80.4 77.6* 77.9* 77.3* 77.6*  PLT 43* 49* 46* 43* 52*   Cardiac Enzymes: No results for input(s): CKTOTAL, CKMB, CKMBINDEX, TROPONINI in the last 168 hours. BNP: Invalid input(s): POCBNP CBG: Recent Labs  Lab 12/26/17 0723 12/26/17 0839 12/26/17 0929 12/26/17 1029 12/26/17 1118  GLUCAP 331*  316* 304* 287* 260*   D-Dimer No results for input(s): DDIMER in the last 72 hours. Hgb A1c No results for input(s): HGBA1C in the last 72 hours. Lipid Profile No results for input(s): CHOL, HDL, LDLCALC, TRIG, CHOLHDL, LDLDIRECT in the last 72 hours. Thyroid function studies No results for input(s): TSH, T4TOTAL, T3FREE, THYROIDAB in the last 72 hours.  Invalid input(s): FREET3 Anemia work up No results for input(s): VITAMINB12, FOLATE, FERRITIN, TIBC, IRON, RETICCTPCT in the last 72 hours. Urinalysis    Component Value Date/Time   COLORURINE YELLOW 12/19/2017 2230   APPEARANCEUR CLEAR 12/19/2017 2230   LABSPEC 1.011 12/19/2017 2230   PHURINE 7.0 12/19/2017 2230   GLUCOSEU NEGATIVE 12/19/2017 2230   HGBUR SMALL (A) 12/19/2017 2230   BILIRUBINUR NEGATIVE 12/19/2017 2230   BILIRUBINUR neg 09/14/2015 1441   KETONESUR NEGATIVE 12/19/2017 2230   PROTEINUR 100 (A) 12/19/2017 2230   UROBILINOGEN 0.2 09/14/2015 1441   UROBILINOGEN 0.2 01/27/2013 2052   NITRITE NEGATIVE 12/19/2017 2230   LEUKOCYTESUR NEGATIVE 12/19/2017 2230   Sepsis Labs Invalid input(s): PROCALCITONIN,  WBC,  LACTICIDVEN Microbiology Recent Results (from the past 240 hour(s))  Blood Culture (routine x 2)     Status: None   Collection Time: 12/19/17  2:10 PM  Result Value Ref Range Status   Specimen Description BLOOD RIGHT ANTECUBITAL  Final   Special Requests AEROBIC BOTTLE ONLY Blood Culture adequate volume  Final   Culture   Final    NO GROWTH 5 DAYS Performed at [facility], [place] [address], [place], [place] [address]    Report Status 12/24/2017 FINAL  Final  Blood Culture (routine x 2)     Status: None   Collection Time: 12/19/17  4:32 PM  Result Value Ref Range Status   Specimen Description BLOOD BLOOD RIGHT HAND  Final   Special Requests   Final    BOTTLES DRAWN AEROBIC AND ANAEROBIC Blood Culture adequate volume   Culture   Final    NO GROWTH 5 DAYS Performed at [facility],  [place] [address], [place], [place] [address]    Report Status 12/24/2017 FINAL  Final  Gastrointestinal Panel by PCR , Stool     Status: Abnormal   Collection Time: 12/19/17  6:47 PM  Result Value Ref Range Status   Campylobacter species NOT DETECTED NOT DETECTED Final   Plesimonas shigelloides NOT DETECTED NOT DETECTED Final   Salmonella species NOT DETECTED NOT DETECTED Final   Yersinia enterocolitica NOT DETECTED NOT DETECTED Final   Vibrio species NOT DETECTED NOT DETECTED Final   Vibrio cholerae NOT DETECTED NOT DETECTED Final   Enteroaggregative E coli (EAEC) NOT DETECTED NOT DETECTED Final   Enteropathogenic E coli (EPEC) NOT DETECTED NOT DETECTED Final   Enterotoxigenic E coli (ETEC) NOT DETECTED NOT DETECTED Final   Shiga like toxin producing E coli (STEC) NOT DETECTED NOT DETECTED Final   E. coli O157 NOT DETECTED NOT DETECTED Final   Shigella/Enteroinvasive E coli (EIEC) NOT DETECTED NOT DETECTED Final   Cryptosporidium NOT DETECTED NOT DETECTED Final   Cyclospora cayetanensis NOT DETECTED  NOT DETECTED Final   Entamoeba histolytica NOT DETECTED NOT DETECTED Final   Giardia lamblia DETECTED (A) NOT DETECTED Final   Adenovirus F40/41 NOT DETECTED NOT DETECTED Final   Astrovirus NOT DETECTED NOT DETECTED Final   Norovirus GI/GII NOT DETECTED NOT DETECTED Final   Rotavirus A NOT DETECTED NOT DETECTED Final   Sapovirus (I, II, IV, and V) NOT DETECTED NOT DETECTED Final    Comment: Performed at [facility], [place] [address] [place], [place] [address]  C difficile quick scan w PCR reflex     Status: None   Collection Time: 12/19/17  6:47 PM  Result Value Ref Range Status   C Diff antigen NEGATIVE NEGATIVE Final   C Diff toxin NEGATIVE NEGATIVE Final   C Diff interpretation No C. difficile detected.  Final    Comment: Performed at [facility], [place] [address], [place], [place] [address]  Respiratory Panel by PCR     Status: None   Collection Time: 12/19/17 11:16 PM   Result Value Ref Range Status   Adenovirus NOT DETECTED NOT DETECTED Final   Coronavirus 229E NOT DETECTED NOT DETECTED Final   Coronavirus HKU1 NOT DETECTED NOT DETECTED Final   Coronavirus NL63 NOT DETECTED NOT DETECTED Final   Coronavirus OC43 NOT DETECTED NOT DETECTED Final   Metapneumovirus NOT DETECTED NOT DETECTED Final   Rhinovirus / Enterovirus NOT DETECTED NOT DETECTED Final   Influenza A NOT DETECTED NOT DETECTED Final   Influenza B NOT DETECTED NOT DETECTED Final   Parainfluenza Virus 1 NOT DETECTED NOT DETECTED Final   Parainfluenza Virus 2 NOT DETECTED NOT DETECTED Final   Parainfluenza Virus 3 NOT DETECTED NOT DETECTED Final   Parainfluenza Virus 4 NOT DETECTED NOT DETECTED Final   Respiratory Syncytial Virus NOT DETECTED NOT DETECTED Final   Bordetella pertussis NOT DETECTED NOT DETECTED Final   Chlamydophila pneumoniae NOT DETECTED NOT DETECTED Final   Mycoplasma pneumoniae NOT DETECTED NOT DETECTED Final  MRSA PCR Screening     Status: None   Collection Time: 12/20/17  7:51 PM  Result Value Ref Range Status   MRSA by PCR NEGATIVE NEGATIVE Final    Comment:        The GeneXpert MRSA Assay (FDA approved for NASAL specimens only), is one component of a comprehensive MRSA colonization surveillance program. It is not intended to diagnose MRSA infection nor to guide or monitor treatment for MRSA infections. Performed at [facility], [place] [address], [place], [place] [address]    Time spent: 30 minutes  SIGNED:   Marylu Lund, MD  [facility] 12/26/2017, 3:23 PM  If 7PM-7AM, please contact night-coverage [email or web address] Password [email or web address]

## 2017-12-27 ENCOUNTER — Encounter (HOSPITAL_COMMUNITY): Payer: Self-pay | Admitting: Vascular Surgery

## 2017-12-30 ENCOUNTER — Telehealth: Payer: Self-pay | Admitting: Vascular Surgery

## 2017-12-30 DIAGNOSIS — I120 Hypertensive chronic kidney disease with stage 5 chronic kidney disease or end stage renal disease: Secondary | ICD-10-CM | POA: Diagnosis not present

## 2017-12-30 DIAGNOSIS — N2581 Secondary hyperparathyroidism of renal origin: Secondary | ICD-10-CM | POA: Diagnosis not present

## 2017-12-30 DIAGNOSIS — D631 Anemia in chronic kidney disease: Secondary | ICD-10-CM | POA: Diagnosis not present

## 2017-12-30 DIAGNOSIS — N185 Chronic kidney disease, stage 5: Secondary | ICD-10-CM | POA: Diagnosis not present

## 2017-12-30 NOTE — Telephone Encounter (Signed)
sch appt spk to pt 02-06-18 3pm p/o PA s/p L 1st stage BVT placement per stf msg

## 2017-12-30 NOTE — Telephone Encounter (Signed)
-----   Message from Penni Homans, RN sent at 12/26/2017 11:26 AM EDT ----- Regarding: Appointment   ----- Message ----- From: Conrad Palmas, MD Sent: 12/26/2017   9:12 AM To: [facility] Charge Pool  William [name] [id number] 05/17/1951   PROCEDURE: 1. left first stage basilic vein transposition (brachiobasilic arteriovenous fistula) placement  Asst: Laurence Slate, [facility]   Follow-up: 6 weeks ([facility])

## 2017-12-31 LAB — CULTURE, BODY FLUID-BOTTLE: CULTURE: NO GROWTH

## 2018-01-02 ENCOUNTER — Other Ambulatory Visit (HOSPITAL_COMMUNITY): Payer: Self-pay | Admitting: Gastroenterology

## 2018-01-02 DIAGNOSIS — N189 Chronic kidney disease, unspecified: Secondary | ICD-10-CM | POA: Diagnosis not present

## 2018-01-02 DIAGNOSIS — D696 Thrombocytopenia, unspecified: Secondary | ICD-10-CM | POA: Diagnosis not present

## 2018-01-02 DIAGNOSIS — D509 Iron deficiency anemia, unspecified: Secondary | ICD-10-CM | POA: Diagnosis not present

## 2018-01-02 DIAGNOSIS — D638 Anemia in other chronic diseases classified elsewhere: Secondary | ICD-10-CM | POA: Diagnosis not present

## 2018-01-02 DIAGNOSIS — R188 Other ascites: Secondary | ICD-10-CM | POA: Diagnosis not present

## 2018-01-02 DIAGNOSIS — M310 Hypersensitivity angiitis: Secondary | ICD-10-CM | POA: Diagnosis not present

## 2018-01-02 DIAGNOSIS — K7460 Unspecified cirrhosis of liver: Secondary | ICD-10-CM | POA: Diagnosis not present

## 2018-01-07 ENCOUNTER — Inpatient Hospital Stay: Payer: Medicare Other

## 2018-01-07 ENCOUNTER — Inpatient Hospital Stay: Payer: Medicare Other | Attending: Internal Medicine | Admitting: Internal Medicine

## 2018-01-07 ENCOUNTER — Telehealth: Payer: Self-pay

## 2018-01-07 ENCOUNTER — Encounter: Payer: Self-pay | Admitting: Internal Medicine

## 2018-01-07 VITALS — BP 126/58 | HR 80 | Temp 98.7°F | Resp 17 | Ht 65.0 in | Wt 143.5 lb

## 2018-01-07 DIAGNOSIS — D61818 Other pancytopenia: Secondary | ICD-10-CM

## 2018-01-07 DIAGNOSIS — M310 Hypersensitivity angiitis: Secondary | ICD-10-CM | POA: Diagnosis not present

## 2018-01-07 DIAGNOSIS — R188 Other ascites: Secondary | ICD-10-CM

## 2018-01-07 DIAGNOSIS — K7460 Unspecified cirrhosis of liver: Secondary | ICD-10-CM

## 2018-01-07 LAB — CBC WITH DIFFERENTIAL (CANCER CENTER ONLY)
BASOS ABS: 0 10*3/uL (ref 0.0–0.1)
Basophils Relative: 1 %
Eosinophils Absolute: 0.1 10*3/uL (ref 0.0–0.5)
Eosinophils Relative: 4 %
HEMATOCRIT: 28.7 % — AB (ref 38.4–49.9)
Hemoglobin: 9.2 g/dL — ABNORMAL LOW (ref 13.0–17.1)
LYMPHS ABS: 0.7 10*3/uL — AB (ref 0.9–3.3)
LYMPHS PCT: 29 %
MCH: 25.4 pg — ABNORMAL LOW (ref 27.2–33.4)
MCHC: 32.1 g/dL (ref 32.0–36.0)
MCV: 79 fL — AB (ref 79.3–98.0)
MONO ABS: 0.4 10*3/uL (ref 0.1–0.9)
Monocytes Relative: 17 %
NEUTROS ABS: 1.2 10*3/uL — AB (ref 1.5–6.5)
Neutrophils Relative %: 49 %
Platelet Count: 56 10*3/uL — ABNORMAL LOW (ref 140–400)
RBC: 3.63 MIL/uL — AB (ref 4.20–5.82)
RDW: 19.4 % — AB (ref 11.0–14.6)
WBC Count: 2.4 10*3/uL — ABNORMAL LOW (ref 4.0–10.3)

## 2018-01-07 LAB — CMP (CANCER CENTER ONLY)
ALBUMIN: 3 g/dL — AB (ref 3.5–5.0)
ALT: 16 U/L (ref 0–55)
AST: 35 U/L — AB (ref 5–34)
Alkaline Phosphatase: 128 U/L (ref 40–150)
Anion gap: 11 (ref 3–11)
BUN: 71 mg/dL — AB (ref 7–26)
CHLORIDE: 105 mmol/L (ref 98–109)
CO2: 21 mmol/L — AB (ref 22–29)
Calcium: 7.6 mg/dL — ABNORMAL LOW (ref 8.4–10.4)
Creatinine: 3.55 mg/dL (ref 0.70–1.30)
GFR, EST NON AFRICAN AMERICAN: 17 mL/min — AB (ref >60)
GFR, Est AFR Am: 19 mL/min — ABNORMAL LOW (ref >60)
Glucose, Bld: 101 mg/dL (ref 70–140)
Potassium: 4.3 mmol/L (ref 3.5–5.1)
SODIUM: 137 mmol/L (ref 136–145)
Total Bilirubin: 1.1 mg/dL (ref 0.2–1.2)
Total Protein: 5.8 g/dL — ABNORMAL LOW (ref 6.4–8.3)

## 2018-01-07 LAB — LACTATE DEHYDROGENASE: LDH: 468 U/L — AB (ref 125–245)

## 2018-01-07 NOTE — Progress Notes (Signed)
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 3) liver cirrhosis and ascites  PRIOR THERAPY: None  CURRENT THERAPY: Supportive care.  INTERVAL HISTORY: William [name] 67 y.o. male returns to the clinic today for follow-up visit accompanied by his son, [name] and his Arabic interpreter.  The patient is feeling fine today with no specific complaints.  He lost few pound after the paracentesis.  His abdomen feels much better.  He denied having any recent swelling of the lower extremities.  He has no bleeding, bruises or ecchymosis.  He denied having any fever or chills.  He has no chest pain, shortness breath, cough or hemoptysis.  He denied having any nausea, vomiting, diarrhea, constipation or abdominal pain.  He was on a taper dose of prednisone which was completed few days ago.  He is here today for evaluation and repeat blood work.  MEDICAL HISTORY: Past Medical History:  Diagnosis Date  . Anemia   . CHF (congestive heart failure) ([place])   . Cirrhosis ([place])   . Cirrhosis ([place])   . CKD (chronic kidney disease), stage IV ([place])   . Goodpasture's disease [facility])    on outpatient plasmapheresis/notes 11/20/2017  . Grade I diastolic dysfunction 25/95/6387  . History of anemia due to chronic kidney disease   . History of blood transfusion X 1   "UGIB; low blood count"  . History of plasmapheresis    "qod" (11/20/2017)  . Hypertension   . Pancytopenia ([place]) 08/20/2017  . Type II diabetes mellitus ([facility])     ALLERGIES:  is allergic to heparin.  MEDICATIONS:  Current Outpatient Medications  Medication Sig Dispense Refill  . acetaminophen (TYLENOL) 650 MG CR tablet Take 650 mg by mouth every 8 (eight) hours as needed for pain.    Marland Kitchen aminocaproic acid (AMICAR) 500 MG  tablet Take 1 tablet (500 mg total) by mouth every 8 (eight) hours. 90 tablet 0  . amLODipine (NORVASC) 10 MG tablet Take 1 tablet (10 mg total) daily by mouth. 90 tablet 0  . calcium acetate (PHOSLO) 667 MG capsule Take 2 capsules (1,334 mg total) by mouth 3 (three) times daily with meals. 90 capsule 0  . carvedilol (COREG) 12.5 MG tablet Take 12.5 mg by mouth 2 (two) times daily with a meal.    . ferrous gluconate (IRON 27) 240 (27 FE) MG tablet Take 240 mg by mouth daily.     . furosemide (LASIX) 40 MG tablet Take 1 tablet (40 mg total) by mouth 2 (two) times daily. 60 tablet 0  . insulin NPH-regular Human (NOVOLIN 70/30) (70-30) 100 UNIT/ML injection Inject 18 Units into the skin 2 (two) times daily with a meal. 10 mL 1  . metroNIDAZOLE (FLAGYL) 500 MG tablet Take 1 tablet (500 mg total) by mouth every 12 (twelve) hours. 2 tablet 0  . Multiple Vitamins-Minerals (MULTIVITAMIN WITH MINERALS) tablet Take 1 tablet by mouth daily.    . predniSONE (DELTASONE) 10 MG tablet Take 1 tablet (10 mg total) by mouth daily with breakfast.     No current facility-administered medications for this visit.     SURGICAL HISTORY:  Past Surgical History:  Procedure Laterality Date  . AV FISTULA PLACEMENT Left 12/26/2017   Procedure: LEFT BRACHIOCEPHALIC ARTERIOVENOUS (AV) FISTULA CREATION;  Surgeon: Conrad Bellewood, MD;  Location: [facility] OR;  Service: Vascular;  Laterality: Left;  . CATARACT EXTRACTION W/ INTRAOCULAR LENS  IMPLANT, BILATERAL Bilateral   . IR FLUORO GUIDE CV LINE RIGHT  11/10/2017  . IR PARACENTESIS  12/10/2017  . IR PARACENTESIS  12/26/2017  . IR REMOVAL TUN CV CATH W/O FL  11/28/2017  . IR US GUIDE VASC ACCESS RIGHT  11/10/2017  . NECK SURGERY  2007   "back of neck; no hardware in there"    REVIEW OF SYSTEMS:  A comprehensive review of systems was negative.   PHYSICAL EXAMINATION: General appearance: alert, cooperative, fatigued and no distress Head: Normocephalic, without obvious abnormality,  atraumatic Neck: no adenopathy, no JVD, supple, symmetrical, trachea midline and thyroid not enlarged, symmetric, no tenderness/mass/nodules Lymph nodes: Cervical, supraclavicular, and axillary nodes normal. Resp: clear to auscultation bilaterally Back: symmetric, no curvature. ROM normal. No CVA tenderness. Cardio: regular rate and rhythm, S1, S2 normal, no murmur, click, rub or gallop GI: soft, non-tender; bowel sounds normal; no masses,  no organomegaly Extremities: extremities normal, atraumatic, no cyanosis or edema  ECOG PERFORMANCE STATUS: 1 - Symptomatic but completely ambulatory  Blood pressure (!) 126/58, pulse 80, temperature 98.7 F (37.1 C), temperature source Oral, resp. rate 17, height 5' 5"  (1.651 m), weight 143 lb 8 oz (65.1 kg), SpO2 97 %.  LABORATORY DATA: Lab Results  Component Value Date   WBC 2.4 (L) 01/07/2018   HGB 9.2 (L) 01/07/2018   HCT 28.7 (L) 01/07/2018   MCV 79.0 (L) 01/07/2018   PLT 56 (L) 01/07/2018      Chemistry      Component Value Date/Time   NA 134 (L) 12/26/2017 0332   NA 137 06/20/2017 1613   K 4.6 12/26/2017 0332   CL 97 (L) 12/26/2017 0332   CO2 23 12/26/2017 0332   BUN 111 (H) 12/26/2017 0332   BUN 62 (H) 06/20/2017 1613   CREATININE 4.74 (H) 12/26/2017 0332   CREATININE 3.68 (HH) 12/15/2017 1508   CREATININE 1.23 08/31/2015 0859      Component Value Date/Time   CALCIUM 6.5 (L) 12/26/2017 0332   ALKPHOS 61 12/22/2017 0324   AST 25 12/22/2017 0324   AST 27 12/15/2017 1508   ALT 22 12/22/2017 0324   ALT 26 12/15/2017 1508   BILITOT 1.8 (H) 12/22/2017 0324   BILITOT 0.6 12/15/2017 1508       RADIOGRAPHIC STUDIES: Ct Abdomen Pelvis Wo Contrast  Result Date: 12/19/2017 CLINICAL DATA:  67 year old male with shortness of breath, fever and abdominal distension. History of cirrhosis. EXAM: CT CHEST, ABDOMEN AND PELVIS WITHOUT CONTRAST TECHNIQUE: Multidetector CT imaging of the chest, abdomen and pelvis was performed following the  standard protocol without IV contrast. COMPARISON:  12/06/2017 abdominal/pelvic CT and 11/20/2017 chest CT FINDINGS: Please note that parenchymal abnormalities may be missed without intravenous contrast. CT CHEST FINDINGS Cardiovascular: Cardiomegaly and coronary/thoracic aortic atherosclerotic calcifications again noted. A small pericardial effusion is unchanged. There is no evidence of thoracic aortic aneurysm. Mediastinum/Nodes: No enlarged mediastinal, hilar, or axillary lymph nodes. Thyroid gland, trachea, and esophagus demonstrate no significant findings. Lungs/Pleura: Diffuse bilateral airspace opacities, primarily central, are noted likely representing edema but infection/superimposed infection is not excluded. Small bilateral pleural effusions are noted. Bibasilar atelectasis identified. There is no evidence of pneumothorax. Musculoskeletal: No acute abnormality CT ABDOMEN PELVIS FINDINGS Hepatobiliary: Cirrhosis changes identified. No gross focal hepatic abnormality noted. No definite gallbladder abnormality. No biliary dilatation. Pancreas: Unremarkable Spleen: Splenomegaly again identified. Adrenals/Urinary Tract: The kidneys, adrenal glands and  bladder are unremarkable. Stomach/Bowel: Stomach is within normal limits. Appendix is unremarkable. No evidence of bowel wall thickening, distention, or inflammatory changes. Vascular/Lymphatic: Aortic atherosclerosis. No enlarged abdominal or pelvic lymph nodes. Reproductive: Prostate enlargement again noted Other: A moderate to large amount of ascites is again noted and relatively unchanged. No evidence of pneumoperitoneum. Subcutaneous edema identified. Musculoskeletal: No acute or suspicious bony abnormalities. IMPRESSION: 1. Diffuse bilateral airspace opacities likely representing pulmonary edema. Infection/superimposed infection not excluded. 2. Moderate to large amount of ascites again noted with small bilateral pleural effusions and diffuse subcutaneous  edema. 3. Cirrhosis and splenomegaly. 4. Cardiomegaly and small pericardial effusion. 5. Coronary artery and Aortic Atherosclerosis (ICD10-I70.0). 6. Prostatomegaly. Electronically Signed   By: Margarette Canada M.D.   On: 12/19/2017 17:44   Dg Chest 2 View  Result Date: 12/22/2017 CLINICAL DATA:  Shortness of breath EXAM: CHEST - 2 VIEW COMPARISON:  Chest radiograph and chest CT Dec 19, 2017 FINDINGS: There is patchy interstitial and alveolar edema throughout the lungs, essentially stable compared to the recent prior study. Heart is enlarged with pulmonary venous hypertension. No adenopathy. There is aortic atherosclerosis. No bone lesions. IMPRESSION: Pulmonary vascular congestion with interstitial and alveolar edema. Likely congestive heart failure. A degree of pneumonia and/or ARDS superimposed cannot be excluded radiographically. Appearance stable compared to recent prior studies. Electronically Signed   By: Lowella Grip III M.D.   On: 12/22/2017 09:31   Dg Chest 2 View  Result Date: 12/19/2017 CLINICAL DATA:  SOB/hypoxia since this morning - hx of CHF, htn, diabetes, cirrhosis, kidney disease, fluid removal EXAM: CHEST - 2 VIEW COMPARISON:  11/19/2017 FINDINGS: The heart is mildly enlarged. There are prominent opacities throughout the lungs, similar in appearance to the previous exams. No new consolidations or pleural effusions. RIGHT central line has been removed. IMPRESSION: Persistent bilateral lung opacities. Electronically Signed   By: Nolon Nations M.D.   On: 12/19/2017 16:10   Ct Chest Wo Contrast  Result Date: 12/19/2017 CLINICAL DATA:  67 year old male with shortness of breath, fever and abdominal distension. History of cirrhosis. EXAM: CT CHEST, ABDOMEN AND PELVIS WITHOUT CONTRAST TECHNIQUE: Multidetector CT imaging of the chest, abdomen and pelvis was performed following the standard protocol without IV contrast. COMPARISON:  12/06/2017 abdominal/pelvic CT and 11/20/2017 chest CT  FINDINGS: Please note that parenchymal abnormalities may be missed without intravenous contrast. CT CHEST FINDINGS Cardiovascular: Cardiomegaly and coronary/thoracic aortic atherosclerotic calcifications again noted. A small pericardial effusion is unchanged. There is no evidence of thoracic aortic aneurysm. Mediastinum/Nodes: No enlarged mediastinal, hilar, or axillary lymph nodes. Thyroid gland, trachea, and esophagus demonstrate no significant findings. Lungs/Pleura: Diffuse bilateral airspace opacities, primarily central, are noted likely representing edema but infection/superimposed infection is not excluded. Small bilateral pleural effusions are noted. Bibasilar atelectasis identified. There is no evidence of pneumothorax. Musculoskeletal: No acute abnormality CT ABDOMEN PELVIS FINDINGS Hepatobiliary: Cirrhosis changes identified. No gross focal hepatic abnormality noted. No definite gallbladder abnormality. No biliary dilatation. Pancreas: Unremarkable Spleen: Splenomegaly again identified. Adrenals/Urinary Tract: The kidneys, adrenal glands and bladder are unremarkable. Stomach/Bowel: Stomach is within normal limits. Appendix is unremarkable. No evidence of bowel wall thickening, distention, or inflammatory changes. Vascular/Lymphatic: Aortic atherosclerosis. No enlarged abdominal or pelvic lymph nodes. Reproductive: Prostate enlargement again noted Other: A moderate to large amount of ascites is again noted and relatively unchanged. No evidence of pneumoperitoneum. Subcutaneous edema identified. Musculoskeletal: No acute or suspicious bony abnormalities. IMPRESSION: 1. Diffuse bilateral airspace opacities likely representing pulmonary edema. Infection/superimposed infection not excluded. 2.  Moderate to large amount of ascites again noted with small bilateral pleural effusions and diffuse subcutaneous edema. 3. Cirrhosis and splenomegaly. 4. Cardiomegaly and small pericardial effusion. 5. Coronary artery and  Aortic Atherosclerosis (ICD10-I70.0). 6. Prostatomegaly. Electronically Signed   By: Margarette Canada M.D.   On: 12/19/2017 17:44   Dg Esophagus  Result Date: 12/25/2017 CLINICAL DATA:  Aspiration pneumonia EXAM: ESOPHOGRAM/BARIUM SWALLOW TECHNIQUE: Single contrast examination was performed using  thin barium. FLUOROSCOPY TIME:  Fluoroscopy Time:  0 minutes 59 seconds Radiation Exposure Index (if provided by the fluoroscopic device): Number of Acquired Spot Images: 0 COMPARISON:  CT chest 12/19/2017 FINDINGS: Negative for aspiration. Moderate decrease in esophageal peristalsis. Negative for hiatal hernia. No stricture or mass 2 cm esophageal diverticulum, just above the carina. This has smooth margins and appears chronic. No significant associated mucosal edema. IMPRESSION: Negative for aspiration Esophageal dysmotility 2 cm soft the geode diverticulum above the carina likely an incidental finding. Electronically Signed   By: Franchot Gallo M.D.   On: 12/25/2017 10:57   US Abdomen Limited  Result Date: 12/25/2017 CLINICAL DATA:  Abdominal distension, check for ascites EXAM: LIMITED ABDOMEN ULTRASOUND FOR ASCITES TECHNIQUE: Limited ultrasound survey for ascites was performed in all four abdominal quadrants. COMPARISON:  CT 12/19/2017 FINDINGS: Mild to moderate ascites within the abdomen with large is fluid pocket seen in the right lower quadrant. Cirrhotic morphology of the liver IMPRESSION: Mild to moderate abdominal ascites Electronically Signed   By: Donavan Foil M.D.   On: 12/25/2017 22:34   Ir Paracentesis  Result Date: 12/26/2017 INDICATION: Patient with history of cirrhosis, abdominal distention. Request is made for diagnostic and therapeutic paracentesis. EXAM: ULTRASOUND GUIDED DIAGNOSTIC AND THERAPEUTIC PARACENTESIS MEDICATIONS: 10 mL 2% lidocaine COMPLICATIONS: None immediate. PROCEDURE: Informed written consent was obtained from the patient after a discussion of the risks, benefits and  alternatives to treatment. A timeout was performed prior to the initiation of the procedure. Initial ultrasound scanning demonstrates a moderate amount of ascites within the left lateral abdomen. The left lateral abdomen was prepped and draped in the usual sterile fashion. 2% lidocaine was used for local anesthesia. Following this, a 19 gauge, 7-cm, Yueh catheter was introduced. An ultrasound image was saved for documentation purposes. The paracentesis was performed. The catheter was removed and a dressing was applied. The patient tolerated the procedure well without immediate post procedural complication. FINDINGS: A total of approximately 3.1 liters of clear, yellow fluid was removed. Samples were sent to the laboratory as requested by the clinical team. IMPRESSION: Successful ultrasound-guided diagnostic and therapeutic paracentesis yielding 3.1 liters of peritoneal fluid. Read by: Brynda Greathouse PA-C Electronically Signed   By: Corrie Mckusick D.O.   On: 12/26/2017 14:18   Ir Paracentesis  Result Date: 12/10/2017 INDICATION: Patient with abdominal distention, ascites. Request is made for diagnostic and therapeutic paracentesis. EXAM: ULTRASOUND GUIDED DIAGNOSTIC AND THERAPEUTIC PARACENTESIS MEDICATIONS: 10 mL 2% lidocaine COMPLICATIONS: None immediate. PROCEDURE: Informed written consent was obtained from the patient after a discussion of the risks, benefits and alternatives to treatment. A timeout was performed prior to the initiation of the procedure. Initial ultrasound scanning demonstrates a moderate amount of ascites within the right lower abdominal quadrant. The right lower abdomen was prepped and draped in the usual sterile fashion. 2% lidocaine was used for local anesthesia. Following this, a 19 gauge, 7-cm, Yueh catheter was introduced. An ultrasound image was saved for documentation purposes. The paracentesis was performed. The catheter was removed and a dressing was applied. The  patient tolerated the  procedure well without immediate post procedural complication. FINDINGS: A total of approximately 3.8 liters of yellow, cloudy fluid was removed. Samples were sent to the laboratory as requested by the clinical team. IMPRESSION: Successful ultrasound-guided diagnostic and therapeutic paracentesis yielding 3.8 liters of peritoneal fluid. Read by: Brynda Greathouse PA-C Electronically Signed   By: Marybelle Killings M.D.   On: 12/10/2017 11:38    ASSESSMENT AND PLAN: This is a very pleasant 67 years old [place] male with pancytopenia secondary to autoimmune disease from recently diagnosed Goodpasture syndrome. He is followed by Dr. Posey Pronto from nephrology as well as Dr. Elsworth Soho from pulmonary medicine. His pancytopenia is secondary to his autoimmune disorder. He completed a course of treatment with the steroids.  He is feeling fine today with no concerning complaints. CBC today showed persistent pancytopenia but the patient is asymptomatic with no concerning fever or chills and no bleeding issues. He does not need any supportive transfusion at this point.  I recommended for the patient to continue on observation with repeat CBC, comprehensive metabolic panel and LDH in 6 weeks. He was advised to call immediately if he has any concerning symptoms in the interval. The patient voices understanding of current disease status and treatment options and is in agreement with the current care plan. All questions were answered. The patient knows to call the clinic with any problems, questions or concerns. We can certainly see the patient much sooner if necessary.  I spent 10 minutes counseling the patient face to face. The total time spent in the appointment was 15 minutes.  Disclaimer: This note was dictated with voice recognition software. Similar sounding words can inadvertently be transcribed and may not be corrected upon review.

## 2018-01-07 NOTE — Telephone Encounter (Signed)
Printed avs and calender of upcoming appiontment. Per 6/5 los (Patient request to be seen by [facility] on this day)

## 2018-01-09 ENCOUNTER — Ambulatory Visit (HOSPITAL_COMMUNITY): Admission: RE | Admit: 2018-01-09 | Payer: Medicare Other | Source: Ambulatory Visit

## 2018-01-13 ENCOUNTER — Other Ambulatory Visit: Payer: Medicaid Other

## 2018-01-13 ENCOUNTER — Ambulatory Visit: Payer: Medicaid Other | Admitting: Internal Medicine

## 2018-01-13 ENCOUNTER — Other Ambulatory Visit (HOSPITAL_COMMUNITY): Payer: Self-pay

## 2018-01-14 ENCOUNTER — Ambulatory Visit (HOSPITAL_COMMUNITY)
Admission: RE | Admit: 2018-01-14 | Discharge: 2018-01-14 | Disposition: A | Discharge status: Home / Self Care | Payer: Medicare Other | Source: Ambulatory Visit | Attending: Nephrology | Admitting: Nephrology

## 2018-01-14 VITALS — BP 130/54 | HR 76 | Temp 97.5°F | Resp 18

## 2018-01-14 DIAGNOSIS — N184 Chronic kidney disease, stage 4 (severe): Secondary | ICD-10-CM | POA: Insufficient documentation

## 2018-01-14 DIAGNOSIS — E113512 Type 2 diabetes mellitus with proliferative diabetic retinopathy with macular edema, left eye: Secondary | ICD-10-CM | POA: Diagnosis not present

## 2018-01-14 LAB — IRON AND TIBC
IRON: 80 ug/dL (ref 45–182)
Saturation Ratios: 33 % (ref 17.9–39.5)
TIBC: 242 ug/dL — ABNORMAL LOW (ref 250–450)
UIBC: 162 ug/dL

## 2018-01-14 LAB — FERRITIN: FERRITIN: 583 ng/mL — AB (ref 24–336)

## 2018-01-14 LAB — POCT HEMOGLOBIN-HEMACUE: HEMOGLOBIN: 9.5 g/dL — AB (ref 13.0–17.0)

## 2018-01-14 MED ORDER — EPOETIN ALFA 10000 UNIT/ML IJ SOLN
INTRAMUSCULAR | Status: AC
  Filled 2018-01-14: qty 1

## 2018-01-14 MED ORDER — EPOETIN ALFA 10000 UNIT/ML IJ SOLN
10000.0000 [IU] | INTRAMUSCULAR | Status: DC
  Administered 2018-01-14: 10000 [IU] via SUBCUTANEOUS

## 2018-01-16 ENCOUNTER — Inpatient Hospital Stay (HOSPITAL_COMMUNITY)
Admission: EM | Admit: 2018-01-16 | Discharge: 2018-01-26 | DRG: 291 | Disposition: A | Discharge status: Home / Self Care | Payer: Medicare Other | Attending: Internal Medicine | Admitting: Internal Medicine

## 2018-01-16 ENCOUNTER — Ambulatory Visit (INDEPENDENT_AMBULATORY_CARE_PROVIDER_SITE_OTHER)
Admission: EM | Admit: 2018-01-16 | Discharge: 2018-01-16 | Disposition: A | Discharge status: Home / Self Care | Payer: Medicare Other | Source: Home / Self Care | Attending: Emergency Medicine | Admitting: Emergency Medicine

## 2018-01-16 ENCOUNTER — Encounter (HOSPITAL_COMMUNITY): Payer: Self-pay | Admitting: Emergency Medicine

## 2018-01-16 ENCOUNTER — Ambulatory Visit (INDEPENDENT_AMBULATORY_CARE_PROVIDER_SITE_OTHER): Payer: Medicare Other

## 2018-01-16 DIAGNOSIS — Z95828 Presence of other vascular implants and grafts: Secondary | ICD-10-CM

## 2018-01-16 DIAGNOSIS — I5033 Acute on chronic diastolic (congestive) heart failure: Secondary | ICD-10-CM | POA: Diagnosis not present

## 2018-01-16 DIAGNOSIS — E11649 Type 2 diabetes mellitus with hypoglycemia without coma: Secondary | ICD-10-CM | POA: Diagnosis present

## 2018-01-16 DIAGNOSIS — R059 Cough, unspecified: Secondary | ICD-10-CM

## 2018-01-16 DIAGNOSIS — Z79899 Other long term (current) drug therapy: Secondary | ICD-10-CM | POA: Diagnosis not present

## 2018-01-16 DIAGNOSIS — R1084 Generalized abdominal pain: Secondary | ICD-10-CM

## 2018-01-16 DIAGNOSIS — R05 Cough: Secondary | ICD-10-CM

## 2018-01-16 DIAGNOSIS — N186 End stage renal disease: Secondary | ICD-10-CM | POA: Diagnosis not present

## 2018-01-16 DIAGNOSIS — E8889 Other specified metabolic disorders: Secondary | ICD-10-CM | POA: Diagnosis present

## 2018-01-16 DIAGNOSIS — M7989 Other specified soft tissue disorders: Secondary | ICD-10-CM | POA: Diagnosis not present

## 2018-01-16 DIAGNOSIS — R188 Other ascites: Secondary | ICD-10-CM | POA: Diagnosis not present

## 2018-01-16 DIAGNOSIS — Z419 Encounter for procedure for purposes other than remedying health state, unspecified: Secondary | ICD-10-CM

## 2018-01-16 DIAGNOSIS — Z8701 Personal history of pneumonia (recurrent): Secondary | ICD-10-CM

## 2018-01-16 DIAGNOSIS — Z833 Family history of diabetes mellitus: Secondary | ICD-10-CM

## 2018-01-16 DIAGNOSIS — D509 Iron deficiency anemia, unspecified: Secondary | ICD-10-CM | POA: Diagnosis not present

## 2018-01-16 DIAGNOSIS — E8770 Fluid overload, unspecified: Secondary | ICD-10-CM | POA: Diagnosis not present

## 2018-01-16 DIAGNOSIS — Z7952 Long term (current) use of systemic steroids: Secondary | ICD-10-CM

## 2018-01-16 DIAGNOSIS — E1122 Type 2 diabetes mellitus with diabetic chronic kidney disease: Secondary | ICD-10-CM | POA: Diagnosis present

## 2018-01-16 DIAGNOSIS — M310 Hypersensitivity angiitis: Secondary | ICD-10-CM | POA: Diagnosis present

## 2018-01-16 DIAGNOSIS — K766 Portal hypertension: Secondary | ICD-10-CM | POA: Diagnosis not present

## 2018-01-16 DIAGNOSIS — K7460 Unspecified cirrhosis of liver: Secondary | ICD-10-CM | POA: Diagnosis not present

## 2018-01-16 DIAGNOSIS — D631 Anemia in chronic kidney disease: Secondary | ICD-10-CM | POA: Diagnosis not present

## 2018-01-16 DIAGNOSIS — R531 Weakness: Secondary | ICD-10-CM | POA: Diagnosis not present

## 2018-01-16 DIAGNOSIS — I10 Essential (primary) hypertension: Secondary | ICD-10-CM | POA: Diagnosis present

## 2018-01-16 DIAGNOSIS — E8881 Metabolic syndrome: Secondary | ICD-10-CM | POA: Diagnosis not present

## 2018-01-16 DIAGNOSIS — Z794 Long term (current) use of insulin: Secondary | ICD-10-CM | POA: Diagnosis not present

## 2018-01-16 DIAGNOSIS — Z992 Dependence on renal dialysis: Secondary | ICD-10-CM

## 2018-01-16 DIAGNOSIS — R0602 Shortness of breath: Secondary | ICD-10-CM

## 2018-01-16 DIAGNOSIS — I132 Hypertensive heart and chronic kidney disease with heart failure and with stage 5 chronic kidney disease, or end stage renal disease: Secondary | ICD-10-CM | POA: Diagnosis not present

## 2018-01-16 DIAGNOSIS — N2581 Secondary hyperparathyroidism of renal origin: Secondary | ICD-10-CM | POA: Diagnosis not present

## 2018-01-16 DIAGNOSIS — R509 Fever, unspecified: Secondary | ICD-10-CM

## 2018-01-16 DIAGNOSIS — D61818 Other pancytopenia: Secondary | ICD-10-CM | POA: Diagnosis not present

## 2018-01-16 DIAGNOSIS — N185 Chronic kidney disease, stage 5: Secondary | ICD-10-CM

## 2018-01-16 DIAGNOSIS — E1129 Type 2 diabetes mellitus with other diabetic kidney complication: Secondary | ICD-10-CM | POA: Diagnosis present

## 2018-01-16 DIAGNOSIS — N184 Chronic kidney disease, stage 4 (severe): Secondary | ICD-10-CM | POA: Diagnosis present

## 2018-01-16 DIAGNOSIS — E1121 Type 2 diabetes mellitus with diabetic nephropathy: Secondary | ICD-10-CM | POA: Diagnosis not present

## 2018-01-16 DIAGNOSIS — R0609 Other forms of dyspnea: Secondary | ICD-10-CM | POA: Diagnosis not present

## 2018-01-16 LAB — BRAIN NATRIURETIC PEPTIDE: B Natriuretic Peptide: 1814.1 pg/mL — ABNORMAL HIGH (ref 0.0–100.0)

## 2018-01-16 LAB — BASIC METABOLIC PANEL
Anion gap: 12 (ref 5–15)
BUN: 95 mg/dL — AB (ref 6–20)
CHLORIDE: 109 mmol/L (ref 101–111)
CO2: 14 mmol/L — AB (ref 22–32)
CREATININE: 4.9 mg/dL — AB (ref 0.61–1.24)
Calcium: 7.1 mg/dL — ABNORMAL LOW (ref 8.9–10.3)
GFR calc Af Amer: 13 mL/min — ABNORMAL LOW (ref >60)
GFR calc non Af Amer: 11 mL/min — ABNORMAL LOW (ref >60)
GLUCOSE: 140 mg/dL — AB (ref 65–99)
Potassium: 4.5 mmol/L (ref 3.5–5.1)
SODIUM: 135 mmol/L (ref 135–145)

## 2018-01-16 LAB — CBC
HCT: 29.3 % — ABNORMAL LOW (ref 39.0–52.0)
Hemoglobin: 9.1 g/dL — ABNORMAL LOW (ref 13.0–17.0)
MCH: 25.1 pg — AB (ref 26.0–34.0)
MCHC: 31.1 g/dL (ref 30.0–36.0)
MCV: 80.7 fL (ref 78.0–100.0)
Platelets: 72 10*3/uL — ABNORMAL LOW (ref 150–400)
RBC: 3.63 MIL/uL — ABNORMAL LOW (ref 4.22–5.81)
RDW: 17.7 % — AB (ref 11.5–15.5)
WBC: 3 10*3/uL — ABNORMAL LOW (ref 4.0–10.5)

## 2018-01-16 LAB — POCT I-STAT, CHEM 8
BUN: 96 mg/dL — AB (ref 6–20)
CALCIUM ION: 0.99 mmol/L — AB (ref 1.15–1.40)
CHLORIDE: 106 mmol/L (ref 101–111)
Creatinine, Ser: 5.2 mg/dL — ABNORMAL HIGH (ref 0.61–1.24)
GLUCOSE: 145 mg/dL — AB (ref 65–99)
HCT: 28 % — ABNORMAL LOW (ref 39.0–52.0)
Hemoglobin: 9.5 g/dL — ABNORMAL LOW (ref 13.0–17.0)
Potassium: 4.5 mmol/L (ref 3.5–5.1)
Sodium: 136 mmol/L (ref 135–145)
TCO2: 15 mmol/L — ABNORMAL LOW (ref 22–32)

## 2018-01-16 LAB — I-STAT TROPONIN, ED: Troponin i, poc: 0.01 ng/mL (ref 0.00–0.08)

## 2018-01-16 MED ORDER — CARVEDILOL 12.5 MG PO TABS
12.5000 mg | ORAL_TABLET | Freq: Two times a day (BID) | ORAL | Status: DC
  Administered 2018-01-17 – 2018-01-26 (×17): 12.5 mg via ORAL
  Filled 2018-01-16 (×17): qty 1

## 2018-01-16 MED ORDER — INSULIN ASPART PROT & ASPART (70-30 MIX) 100 UNIT/ML ~~LOC~~ SUSP
12.0000 [IU] | Freq: Two times a day (BID) | SUBCUTANEOUS | Status: DC
  Administered 2018-01-17: 12 [IU] via SUBCUTANEOUS
  Filled 2018-01-16: qty 10

## 2018-01-16 MED ORDER — FUROSEMIDE 10 MG/ML IJ SOLN
40.0000 mg | Freq: Two times a day (BID) | INTRAMUSCULAR | Status: DC
  Administered 2018-01-17: 40 mg via INTRAVENOUS
  Filled 2018-01-16: qty 4

## 2018-01-16 MED ORDER — INSULIN ASPART 100 UNIT/ML ~~LOC~~ SOLN
0.0000 [IU] | Freq: Three times a day (TID) | SUBCUTANEOUS | Status: DC
  Administered 2018-01-17: 2 [IU] via SUBCUTANEOUS
  Administered 2018-01-17: 3 [IU] via SUBCUTANEOUS
  Administered 2018-01-18 (×2): 1 [IU] via SUBCUTANEOUS
  Administered 2018-01-18: 2 [IU] via SUBCUTANEOUS
  Administered 2018-01-19: 3 [IU] via SUBCUTANEOUS
  Administered 2018-01-19: 2 [IU] via SUBCUTANEOUS
  Administered 2018-01-20: 3 [IU] via SUBCUTANEOUS
  Administered 2018-01-20: 7 [IU] via SUBCUTANEOUS
  Administered 2018-01-20: 1 [IU] via SUBCUTANEOUS
  Administered 2018-01-21: 9 [IU] via SUBCUTANEOUS
  Administered 2018-01-22: 3 [IU] via SUBCUTANEOUS
  Administered 2018-01-22: 5 [IU] via SUBCUTANEOUS
  Administered 2018-01-22: 1 [IU] via SUBCUTANEOUS
  Administered 2018-01-23: 3 [IU] via SUBCUTANEOUS
  Administered 2018-01-24: 2 [IU] via SUBCUTANEOUS
  Administered 2018-01-24: 5 [IU] via SUBCUTANEOUS
  Administered 2018-01-24: 2 [IU] via SUBCUTANEOUS
  Administered 2018-01-25: 5 [IU] via SUBCUTANEOUS
  Administered 2018-01-25: 3 [IU] via SUBCUTANEOUS
  Administered 2018-01-25: 2 [IU] via SUBCUTANEOUS
  Administered 2018-01-26: 3 [IU] via SUBCUTANEOUS

## 2018-01-16 MED ORDER — ONDANSETRON HCL 4 MG/2ML IJ SOLN: 4.0000 mg | Freq: Three times a day (TID) | INTRAMUSCULAR | Status: DC | PRN

## 2018-01-16 MED ORDER — CALCIUM ACETATE (PHOS BINDER) 667 MG PO CAPS
1334.0000 mg | ORAL_CAPSULE | Freq: Three times a day (TID) | ORAL | Status: DC
  Administered 2018-01-17 (×2): 1334 mg via ORAL
  Filled 2018-01-16 (×3): qty 2

## 2018-01-16 MED ORDER — HYDRALAZINE HCL 20 MG/ML IJ SOLN: 5.0000 mg | INTRAMUSCULAR | Status: DC | PRN

## 2018-01-16 MED ORDER — ADULT MULTIVITAMIN W/MINERALS CH
1.0000 | ORAL_TABLET | Freq: Every day | ORAL | Status: DC
  Administered 2018-01-17 – 2018-01-18 (×2): 1 via ORAL
  Filled 2018-01-16 (×2): qty 1

## 2018-01-16 MED ORDER — FUROSEMIDE 10 MG/ML IJ SOLN
40.0000 mg | Freq: Once | INTRAMUSCULAR | Status: AC
  Administered 2018-01-16: 40 mg via INTRAVENOUS
  Filled 2018-01-16: qty 4

## 2018-01-16 MED ORDER — ZOLPIDEM TARTRATE 5 MG PO TABS: 5.0000 mg | ORAL_TABLET | Freq: Every evening | ORAL | Status: DC | PRN

## 2018-01-16 MED ORDER — AMLODIPINE BESYLATE 10 MG PO TABS
10.0000 mg | ORAL_TABLET | Freq: Every day | ORAL | Status: DC
  Administered 2018-01-17 – 2018-01-19 (×3): 10 mg via ORAL
  Filled 2018-01-16 (×3): qty 1

## 2018-01-16 MED ORDER — FERROUS GLUCONATE 324 (38 FE) MG PO TABS
324.0000 mg | ORAL_TABLET | Freq: Every day | ORAL | Status: DC
  Administered 2018-01-17 – 2018-01-26 (×10): 324 mg via ORAL
  Filled 2018-01-16 (×10): qty 1

## 2018-01-16 NOTE — H&P (Signed)
History and Physical    William [name] ZOX:[id number] DOB: 13-Jul-1951 DOA: 01/16/2018  Referring MD/NP/PA:   PCP: Gildardo Pounds, NP   Patient coming from:  The patient is coming from home.  At baseline, pt is independent for most of ADL.  Chief Complaint: SOB  HPI: William [name] is a 67 y.o. male with medical history significant of goodpasture syndrome, pancytopenia, hypertension, upper GI bleeding, cirrhosis with ascites, dCHF, CKD-4 (AVF placed on L arm, but not on dialysis yet), and diabetes, who presents with shortness of breath.  Pt speaks Arabic. His son is at bedside and helped for translation.  Patient states that he has been having shortness of breath in the past several days, which has been progressively getting worse.  He has dry cough, but no fever, chills or chest pain.  Patient states that his leg edema has worsened.  His belly is also swollen.  He has some nausea, but no vomiting, abdominal pain.  He had mild diarrhea yesterday which has resolved.  Denies symptoms of UTI or unilateral weakness.  ED Course: pt was found to have BNP 1814, negative troponin, pancytopenia with WBC 3.0, hemoglobin 9.1, platelets 72, worsening renal function, temperature normal, no tachycardia, no tachypnea, oxygen saturation 98% on room air.  Chest x-ray showed pulmonary edema.  Patient is admitted to telemetry bed as inpatient.  Review of Systems:   General: no fevers, chills,  has poor appetite, has fatigue HEENT: no blurry vision, hearing changes or sore throat Respiratory: has dyspnea, coughing, no wheezing CV: no chest pain, no palpitations GI: has nausea, no vomiting, abdominal pain, diarrhea, constipation.  GU: no dysuria, burning on urination, increased urinary frequency, hematuria  Ext: has leg edema Neuro: no unilateral weakness, numbness, or tingling, no vision change or hearing loss Skin: no rash, no skin tear. MSK: No muscle spasm, no deformity, no limitation of range of movement  in spin Heme: No easy bruising.  Travel history: No recent long distant travel.  Allergy:  Allergies  Allergen Reactions  . Heparin     Patient is Muslim and is not permitted Pork derative due to religious belief)    Past Medical History:  Diagnosis Date  . Anemia   . CHF (congestive heart failure) ([place])   . Cirrhosis ([place])   . Cirrhosis ([place])   . CKD (chronic kidney disease), stage IV ([place])   . Goodpasture's disease [facility])    on outpatient plasmapheresis/notes 11/20/2017  . Grade I diastolic dysfunction 81/19/1478  . History of anemia due to chronic kidney disease   . History of blood transfusion X 1   "UGIB; low blood count"  . History of plasmapheresis    "qod" (11/20/2017)  . Hypertension   . Pancytopenia ([place]) 08/20/2017  . Type II diabetes mellitus ([place])     Past Surgical History:  Procedure Laterality Date  . AV FISTULA PLACEMENT Left 12/26/2017   Procedure: LEFT BRACHIOCEPHALIC ARTERIOVENOUS (AV) FISTULA CREATION;  Surgeon: Conrad Schuylkill, MD;  Location: [place];  Service: Vascular;  Laterality: Left;  . CATARACT EXTRACTION W/ INTRAOCULAR LENS  IMPLANT, BILATERAL Bilateral   . IR FLUORO GUIDE CV LINE RIGHT  11/10/2017  . IR PARACENTESIS  12/10/2017  . IR PARACENTESIS  12/26/2017  . IR REMOVAL TUN CV CATH W/O FL  11/28/2017  . IR US GUIDE VASC ACCESS RIGHT  11/10/2017  . NECK SURGERY  2007   "back of neck; no hardware in there"    Social History:  reports that he  has never smoked. He has never used smokeless tobacco. He reports that he does not drink alcohol or use drugs.  Family History:  Family History  Problem Relation Age of Onset  . Diabetes Mellitus II Sister   . Stroke Brother      Prior to Admission medications   Medication Sig Start Date End Date Taking? Authorizing Provider  acetaminophen (TYLENOL) 650 MG CR tablet Take 650 mg by mouth every 8 (eight) hours as needed for pain.    [provider]  amLODipine (NORVASC) 10 MG tablet Take 1 tablet (10  mg total) daily by mouth. 06/20/17 11/06/24  Gildardo Pounds, NP  calcium acetate (PHOSLO) 667 MG capsule Take 2 capsules (1,334 mg total) by mouth 3 (three) times daily with meals. 12/26/17   Donne Hazel, MD  carvedilol (COREG) 12.5 MG tablet Take 12.5 mg by mouth 2 (two) times daily with a meal.    [provider]  ferrous gluconate (IRON 27) 240 (27 FE) MG tablet Take 240 mg by mouth daily.     [provider]  furosemide (LASIX) 40 MG tablet Take 1 tablet (40 mg total) by mouth 2 (two) times daily. 11/18/17 11/18/18  Regalado, Jerald Kief A, MD  insulin NPH-regular Human (NOVOLIN 70/30) (70-30) 100 UNIT/ML injection Inject 18 Units into the skin 2 (two) times daily with a meal. 11/18/17   Regalado, Belkys A, MD  metroNIDAZOLE (FLAGYL) 500 MG tablet Take 1 tablet (500 mg total) by mouth every 12 (twelve) hours. 12/26/17   Donne Hazel, MD  Multiple Vitamins-Minerals (MULTIVITAMIN WITH MINERALS) tablet Take 1 tablet by mouth daily.    [provider]  predniSONE (DELTASONE) 10 MG tablet Take 1 tablet (10 mg total) by mouth daily with breakfast. 12/27/17   Donne Hazel, MD    Physical Exam: Vitals:   01/16/18 2215 01/16/18 2315 01/16/18 2330 01/17/18 0007  BP: (!) 142/69 (!) 146/69 140/72 (!) 144/83  Pulse: 84 82 80 86  Resp: 15 16 17 20   Temp:    98.3 F (36.8 C)  TempSrc:    Oral  SpO2: 96% 98% 95% 99%  Weight:    67.9 kg (149 lb 9.6 oz)  Height:    5' 5"  (1.651 m)   General: Not in acute distress HEENT:       Eyes: PERRL, EOMI, no scleral icterus.       ENT: No discharge from the ears and nose, no pharynx injection, no tonsillar enlargement.        Neck: positive JVD, no bruit, no mass felt. Heme: No neck lymph node enlargement. Cardiac: S1/S2, RRR, No murmurs, No gallops or rubs. Respiratory: No rales, wheezing, rhonchi or rubs. GI: Soft, distended, nontender, no rebound pain, no organomegaly, BS present. GU: No hematuria Ext: 2+ pitting leg edema  bilaterally. 2+DP/PT pulse bilaterally. Musculoskeletal: No joint deformities, No joint redness or warmth, no limitation of ROM in spin. Skin: No rashes.  Neuro: Alert, oriented X3, cranial nerves II-XII grossly intact, moves all extremities normally. Psych: Patient is not psychotic, no suicidal or hemocidal ideation.  Labs on Admission: I have personally reviewed following labs and imaging studies  CBC: Recent Labs  Lab 01/14/18 1314 01/16/18 1645 01/16/18 1731 01/17/18 0142  WBC  --   --  3.0* 3.0*  HGB 9.5* 9.5* 9.1* 8.4*  HCT  --  28.0* 29.3* 26.7*  MCV  --   --  80.7 79.9  PLT  --   --  72* 83*   Basic Metabolic Panel: Recent Labs  Lab 01/16/18 1645 01/16/18 1731 01/17/18 0142  NA 136 135 134*  K 4.5 4.5 4.1  CL 106 109 109  CO2  --  14* 15*  GLUCOSE 145* 140* 180*  BUN 96* 95* 96*  CREATININE 5.20* 4.90* 4.72*  CALCIUM  --  7.1* 6.9*   GFR: Estimated Creatinine Clearance: 13.4 mL/min (A) (by C-G formula based on SCr of 4.72 mg/dL (H)). Liver Function Tests: No results for input(s): AST, ALT, ALKPHOS, BILITOT, PROT, ALBUMIN in the last 168 hours. No results for input(s): LIPASE, AMYLASE in the last 168 hours. Recent Labs  Lab 01/17/18 0452  AMMONIA 51*   Coagulation Profile: Recent Labs  Lab 01/17/18 0142  INR 1.28   Cardiac Enzymes: No results for input(s): CKTOTAL, CKMB, CKMBINDEX, TROPONINI in the last 168 hours. BNP (last 3 results) Recent Labs    09/25/17 1614  PROBNP 380.0*   HbA1C: No results for input(s): HGBA1C in the last 72 hours. CBG: No results for input(s): GLUCAP in the last 168 hours. Lipid Profile: No results for input(s): CHOL, HDL, LDLCALC, TRIG, CHOLHDL, LDLDIRECT in the last 72 hours. Thyroid Function Tests: No results for input(s): TSH, T4TOTAL, FREET4, T3FREE, THYROIDAB in the last 72 hours. Anemia Panel: Recent Labs    01/14/18 1310  FERRITIN 583*  TIBC 242*  IRON 80   Urine analysis:    Component Value  Date/Time   COLORURINE YELLOW 12/19/2017 2230   APPEARANCEUR CLEAR 12/19/2017 2230   LABSPEC 1.011 12/19/2017 2230   PHURINE 7.0 12/19/2017 2230   GLUCOSEU NEGATIVE 12/19/2017 2230   HGBUR SMALL (A) 12/19/2017 2230   BILIRUBINUR NEGATIVE 12/19/2017 2230   BILIRUBINUR neg 09/14/2015 1441   KETONESUR NEGATIVE 12/19/2017 2230   PROTEINUR 100 (A) 12/19/2017 2230   UROBILINOGEN 0.2 09/14/2015 1441   UROBILINOGEN 0.2 01/27/2013 2052   NITRITE NEGATIVE 12/19/2017 2230   LEUKOCYTESUR NEGATIVE 12/19/2017 2230   Sepsis Labs: @LABRCNTIP (procalcitonin:4,lacticidven:4) )No results found for this or any previous visit (from the past 240 hour(s)).   Radiological Exams on Admission: Dg Chest 2 View  Result Date: 01/16/2018 CLINICAL DATA:  Cough for 3 days, fever, shortness of breath EXAM: CHEST - 2 VIEW COMPARISON:  12/22/2017 FINDINGS: Enlargement of cardiac silhouette with pulmonary vascular congestion. Interstitial infiltrates consistent with pulmonary edema, slightly improved from previous exam. No pleural effusion or pneumothorax. Bones unremarkable. Incidentally noted atherosclerotic calcification at the aortic arch. IMPRESSION: Improved pulmonary edema versus 12/22/2017. Electronically Signed   By: Lavonia Dana M.D.   On: 01/16/2018 17:03     EKG: Independently reviewed.  Sinus rhythm, QTC 456, nonspecific T wave change.   Assessment/Plan Principal Problem:   SOB (shortness of breath) Active Problems:   Type II diabetes mellitus with renal manifestations (HCC)   Acute on chronic diastolic CHF (congestive heart failure) (HCC)   Pancytopenia (HCC)   Essential hypertension   Cirrhosis of liver with ascites (HCC)   CKD (chronic kidney disease) stage 4, GFR 15-29 ml/min (HCC)   Fluid overload   SOB (shortness of breath): Most likely due to fluid overload secondary to combination of stage IV kidney disease and CHF exacerbation.  Patient has a 2+ leg edema, elevated BNP 1814, indicating  fluid overload.  -Admitted to telemetry bed as inpatient per - IV Lasix 40 mg twice daily (will give 40 x 2 mg tonight) -albuterol nebs prn -Fluid restrictions renal diet  Acute on chronic diastolic CHF: 2D echo on 08/20/5788  showed EF 65-70% with grade 1 diastolic dysfunction.  Now patient has a fluid overload see above -On IV  Lasix as above  Type II diabetes mellitus with renal manifestations ([place]): Last A1c 8.1 on 06/20/17, poorly controled. Patient is taking70/30 insulin at home -will decrease 70/30 insulin dose from  18 to 12 U bid -SSI  Pancytopenia ([place]): due to CKD. hgb stable.  Hemoglobin 9.2 on 01/07/2018, 9.1 today.  Platelets 72, no bleeding tendency. -Follow-up with CBC -Continue iron supplement  HTN:  -Continue home medications: Amlodipine, Coreg, -IV hydralazine prn  Cirrhosis of liver with ascites [facility]): Mental status normal. Ammonia level is elevated at 51 -start lactulose 20 g twice daily   CKD (chronic kidney disease) stage 4, GFR 15-29 ml/min ([place]): Baseline creatinine 3.5-4.5.  His creatinine is 4.9, BUN 95, slightly worsening. Pt has AVF placed on left arm, not started HD yet. -Follow-up renal function may be map   DVT ppx: SCD Code Status: Full code Family Communication: Yes, patient's son  at bed side Disposition Plan:  Anticipate discharge back to previous home environment Consults called:  none Admission status:  Inpatient/tele      Date of Service 01/17/2018    Ivor Costa [facility] Pager [phone]  If 7PM-7AM, please contact night-coverage [email or web address] Password [facility] 01/17/2018, 6:42 AM

## 2018-01-16 NOTE — ED Provider Notes (Signed)
[place]    CSN: [id number] Arrival date & time: 01/16/18  1607     History   Chief Complaint Chief Complaint  Patient presents with  . Cough    HPI William [name] is a 67 y.o. male.   Son brought in father for cough, sob, not eating, weakness. Pt was to have paracentetics completed on 6/7 but son states that his abd was soft so he cancelled the appoint. Pt did have a av fistula placed for dialysis but he has not started this yet. Son states that he is taking his medication which includes 57m of lasix but is not voiding much. Also c/o abd "beginning to hurt". Pt is alert and talking. Has had pneumonia in the past with similar sx son would like to have a chest x ray. Denies any fever son is here for arabic interp     Past Medical History:  Diagnosis Date  . Anemia   . CHF (congestive heart failure) ([place]   . Cirrhosis ([place]   . Cirrhosis ([place]   . CKD (chronic kidney disease), stage IV ([facility]   . Goodpasture's disease ([facility]    on outpatient plasmapheresis/notes 11/20/2017  . Grade I diastolic dysfunction 001/75/1025 . History of anemia due to chronic kidney disease   . History of blood transfusion X 1   "UGIB; low blood count"  . History of plasmapheresis    "qod" (11/20/2017)  . Hypertension   . Pancytopenia ([place] 08/20/2017  . Type II diabetes mellitus ([facility]     Patient Active Problem List   Diagnosis Date Noted  . CKD (chronic kidney disease) stage 4, GFR 15-29 ml/min (HCC) 12/19/2017  . Hyperkalemia 12/19/2017  . HCAP (healthcare-associated pneumonia) 12/19/2017  . Ascites 12/19/2017  . Cirrhosis of liver with ascites ([place] 12/09/2017  . Pulmonary infiltrates   . Goodpasture's syndrome ([place] with associated hemoptysis 11/20/2017  . Asymmetric edema of both lower extremities 11/20/2017  . Metabolic acidosis 085/27/7824 . Hemoptysis   . AKI (acute kidney injury) ([place] 11/07/2017  . Acute respiratory failure with hypoxia ([place] 11/06/2017  . Acute  on chronic diastolic CHF (congestive heart failure) ([place] 11/06/2017  . Pancytopenia ([place] 11/06/2017  . Acute renal failure superimposed on stage 4 chronic kidney disease ([place] 11/06/2017  . GERD (gastroesophageal reflux disease) 11/06/2017  . Essential hypertension 11/06/2017  . Dyspnea on exertion 09/25/2017  . Type II diabetes mellitus with renal manifestations ([place] 08/25/2015  . Chronic cough 06/10/2013  . Chronic neck pain 06/10/2013    Past Surgical History:  Procedure Laterality Date  . AV FISTULA PLACEMENT Left 12/26/2017   Procedure: LEFT BRACHIOCEPHALIC ARTERIOVENOUS (AV) FISTULA CREATION;  Surgeon: CConrad Augusta MD;  Location[place]  Service: Vascular;  Laterality: Left;  . CATARACT EXTRACTION W/ INTRAOCULAR LENS  IMPLANT, BILATERAL Bilateral   . IR FLUORO GUIDE CV LINE RIGHT  11/10/2017  . IR PARACENTESIS  12/10/2017  . IR PARACENTESIS  12/26/2017  . IR REMOVAL TUN CV CATH W/O FL  11/28/2017  . IR [place] VASC ACCESS RIGHT  11/10/2017  . NECK SURGERY  2007   "back of neck; no hardware in there"       Home Medications    Prior to Admission medications   Medication Sig Start Date End Date Taking? Authorizing Provider  acetaminophen (TYLENOL) 650 MG CR tablet Take 650 mg by mouth every 8 (eight) hours as needed for pain.    [provider]  amLODipine (NORVASC) 10 MG  tablet Take 1 tablet (10 mg total) daily by mouth. 06/20/17 11/06/24  Gildardo Pounds, NP  calcium acetate (PHOSLO) 667 MG capsule Take 2 capsules (1,334 mg total) by mouth 3 (three) times daily with meals. 12/26/17   Donne Hazel, MD  carvedilol (COREG) 12.5 MG tablet Take 12.5 mg by mouth 2 (two) times daily with a meal.    [provider]  ferrous gluconate (IRON 27) 240 (27 FE) MG tablet Take 240 mg by mouth daily.     [provider]  furosemide (LASIX) 40 MG tablet Take 1 tablet (40 mg total) by mouth 2 (two) times daily. 11/18/17 11/18/18  Regalado, Jerald Kief A, MD  insulin  NPH-regular Human (NOVOLIN 70/30) (70-30) 100 UNIT/ML injection Inject 18 Units into the skin 2 (two) times daily with a meal. 11/18/17   Regalado, Belkys A, MD  metroNIDAZOLE (FLAGYL) 500 MG tablet Take 1 tablet (500 mg total) by mouth every 12 (twelve) hours. 12/26/17   Donne Hazel, MD  Multiple Vitamins-Minerals (MULTIVITAMIN WITH MINERALS) tablet Take 1 tablet by mouth daily.    [provider]  predniSONE (DELTASONE) 10 MG tablet Take 1 tablet (10 mg total) by mouth daily with breakfast. 12/27/17   Donne Hazel, MD    Family History Family History  Problem Relation Age of Onset  . Diabetes Mellitus II Sister   . Stroke Brother     Social History Social History   Tobacco Use  . Smoking status: Never Smoker  . Smokeless tobacco: Never Used  Substance Use Topics  . Alcohol use: Never    Frequency: Never  . Drug use: Never     Allergies   Heparin   Review of Systems Review of Systems  Constitutional: Positive for activity change, appetite change and fatigue.  HENT: Negative.   Eyes: Negative.   Respiratory: Positive for cough and shortness of breath.   Cardiovascular: Negative.   Gastrointestinal: Positive for abdominal distention, abdominal pain and nausea.  Genitourinary: Positive for decreased urine volume.  Musculoskeletal: Negative.   Skin: Negative.   Neurological: Negative.      Physical Exam Triage Vital Signs ED Triage Vitals [01/16/18 1617]  Enc Vitals Group     BP 126/67     Pulse Rate 78     Resp 18     Temp 98.5 F (36.9 C)     Temp Source Oral     SpO2 98 %     Weight      Height      Head Circumference      Peak Flow      Pain Score 6     Pain Loc      Pain Edu?      Excl. in [place]?    No data found.  Updated Vital Signs BP 126/67 (BP Location: Right Arm)   Pulse 78   Temp 98.5 F (36.9 C) (Oral)   Resp 18   SpO2 98%   Visual Acuity  Physical Exam  Constitutional: He appears well-developed.  HENT:  Head:  Normocephalic.  Right Ear: External ear normal.  Left Ear: External ear normal.  Nose: Nose normal.  Mouth/Throat: Oropharynx is clear and moist.  Eyes: Pupils are equal, round, and reactive to light.  Neck: Normal range of motion.  Cardiovascular: Normal rate.  Pulmonary/Chest: Effort normal.  Abdominal: He exhibits distension.  Firm   Musculoskeletal: Normal range of motion.  Neurological: He is alert.  Skin: Skin is warm. Capillary  refill takes less than 2 seconds.     UC Treatments / Results  Labs (all labs ordered are listed, but only abnormal results are displayed) Labs Reviewed  POCT I-STAT, CHEM 8 - Abnormal; Notable for the following components:      Result Value   BUN 96 (*)    Creatinine, Ser 5.20 (*)    Glucose, Bld 145 (*)    Calcium, Ion 0.99 (*)    TCO2 15 (*)    Hemoglobin 9.5 (*)    HCT 28.0 (*)    All other components within normal limits    EKG None  Radiology Dg Chest 2 View  Result Date: 01/16/2018 CLINICAL DATA:  Cough for 3 days, fever, shortness of breath EXAM: CHEST - 2 VIEW COMPARISON:  12/22/2017 FINDINGS: Enlargement of cardiac silhouette with pulmonary vascular congestion. Interstitial infiltrates consistent with pulmonary edema, slightly improved from previous exam. No pleural effusion or pneumothorax. Bones unremarkable. Incidentally noted atherosclerotic calcification at the aortic arch. IMPRESSION: Improved pulmonary edema versus 12/22/2017. Electronically Signed   By: Lavonia Dana M.D.   On: 01/16/2018 17:03    Procedures Procedures (including critical care time)  Medications Ordered in UC Medications - No data to display  Initial Impression / Assessment and Plan / UC Course  I have reviewed the triage vital signs and the nursing notes.  Pertinent labs & imaging results that were available during my care of the patient were reviewed by me and considered in my medical decision making (see chart for details).     Discussed with son  that pt will need to go to the ER due to excessive fluid, creatine of 5  Reviewed previous charts  Av fistual placed on 5/21  Discussed pt will MD nelson and she agreed to go to the er   Final Clinical Impressions(s) / UC Diagnoses   Final diagnoses:  Cough  Shortness of breath  Generalized abdominal pain     Discharge Instructions     Pt will need to go to er , charged nurse called discussed plan with son      ED Prescriptions    None     Controlled Substance Prescriptions [place] Controlled Substance Registry consulted? Not Applicable   Marney Setting, NP 01/16/18 1710

## 2018-01-16 NOTE — ED Triage Notes (Signed)
Pt presents with son who helps translate (speaks arabic)-- C/C of cough, SOB, leg and abd swelling, and fatigue ongoing for several days; pt reports subjective fever/chills; per son, has fistula to L arm but has not started dialysis yet

## 2018-01-16 NOTE — ED Notes (Signed)
Attempted report x 1; name and call back number provided 

## 2018-01-16 NOTE — Discharge Instructions (Addendum)
Pt will need to go to er , charged nurse called discussed plan with son

## 2018-01-16 NOTE — ED Triage Notes (Signed)
Pt here for cough and congestion

## 2018-01-16 NOTE — ED Provider Notes (Signed)
[facility] [place] EMERGENCY DEPARTMENT Provider Note   CSN: [id number] Arrival date & time: 01/16/18  1713     History   Chief Complaint Chief Complaint  Patient presents with  . Cough  . Shortness of Breath  . Leg Swelling    HPI William [name] is a 67 y.o. male presenting for evaluation of shortness of breath and cough.  Patient speaks Arabic, patient's son helped with translation.  Patient states he has been having worsening shortness of breath and tiredness over the past several days.  He developed a dry cough yesterday.  He is unable to walk from the bed to the end of the hallway without taking a break.  He states he has had tiredness and shortness of breath in the past, but it is not worse.  He denies recent fevers, chills, chest pain, nausea, vomiting, abdominal pain, urinary symptoms, abnormal bowel movements.  He has chronic leg swelling bilaterally, no change.  He denies abdominal distention or swelling, but son states it appears slightly more bloated than normal.  Past medical history significant for Goodpasture's, CKD with fistula placement without dialysis, cirrhosis, CHF, diabetes, hypertension, anemia.  He is on Lasix daily, 80 mg in the morning, 40 at night.  No change recently.  Per chart review, last echo was in March 2019 with an EF of 65 to 70%.  HPI  Past Medical History:  Diagnosis Date  . Anemia   . CHF (congestive heart failure) ([place])   . Cirrhosis ([place])   . Cirrhosis ([place])   . CKD (chronic kidney disease), stage IV ([place])   . Goodpasture's disease [facility])    on outpatient plasmapheresis/notes 11/20/2017  . Grade I diastolic dysfunction 29/52/8413  . History of anemia due to chronic kidney disease   . History of blood transfusion X 1   "UGIB; low blood count"  . History of plasmapheresis    "qod" (11/20/2017)  . Hypertension   . Pancytopenia ([place]) 08/20/2017  . Type II diabetes mellitus [facility])     Patient Active Problem List   Diagnosis  Date Noted  . CKD (chronic kidney disease) stage 4, GFR 15-29 ml/min (HCC) 12/19/2017  . Hyperkalemia 12/19/2017  . HCAP (healthcare-associated pneumonia) 12/19/2017  . Ascites 12/19/2017  . Cirrhosis of liver with ascites ([place]) 12/09/2017  . Pulmonary infiltrates   . Goodpasture's syndrome ([place]) with associated hemoptysis 11/20/2017  . Asymmetric edema of both lower extremities 11/20/2017  . Metabolic acidosis 24/40/1027  . Hemoptysis   . AKI (acute kidney injury) ([place]) 11/07/2017  . Acute respiratory failure with hypoxia ([place]) 11/06/2017  . Acute on chronic diastolic CHF (congestive heart failure) ([place]) 11/06/2017  . Pancytopenia ([place]) 11/06/2017  . Acute renal failure superimposed on stage 4 chronic kidney disease ([place]) 11/06/2017  . GERD (gastroesophageal reflux disease) 11/06/2017  . Essential hypertension 11/06/2017  . Dyspnea on exertion 09/25/2017  . Type II diabetes mellitus with renal manifestations ([place]) 08/25/2015  . Chronic cough 06/10/2013  . Chronic neck pain 06/10/2013    Past Surgical History:  Procedure Laterality Date  . AV FISTULA PLACEMENT Left 12/26/2017   Procedure: LEFT BRACHIOCEPHALIC ARTERIOVENOUS (AV) FISTULA CREATION;  Surgeon: Conrad [place], MD;  Location: [place];  Service: Vascular;  Laterality: Left;  . CATARACT EXTRACTION W/ INTRAOCULAR LENS  IMPLANT, BILATERAL Bilateral   . IR FLUORO GUIDE CV LINE RIGHT  11/10/2017  . IR PARACENTESIS  12/10/2017  . IR PARACENTESIS  12/26/2017  . IR REMOVAL TUN CV CATH W/O FL  11/28/2017  . IR US GUIDE VASC ACCESS RIGHT  11/10/2017  . NECK SURGERY  2007   "back of neck; no hardware in there"        Home Medications    Prior to Admission medications   Medication Sig Start Date End Date Taking? Authorizing Provider  acetaminophen (TYLENOL) 650 MG CR tablet Take 650 mg by mouth every 8 (eight) hours as needed for pain.    [provider]  amLODipine (NORVASC) 10 MG tablet Take 1 tablet (10 mg total) daily by  mouth. 06/20/17 11/06/24  Gildardo Pounds, NP  calcium acetate (PHOSLO) 667 MG capsule Take 2 capsules (1,334 mg total) by mouth 3 (three) times daily with meals. 12/26/17   Donne Hazel, MD  carvedilol (COREG) 12.5 MG tablet Take 12.5 mg by mouth 2 (two) times daily with a meal.    [provider]  ferrous gluconate (IRON 27) 240 (27 FE) MG tablet Take 240 mg by mouth daily.     [provider]  furosemide (LASIX) 40 MG tablet Take 1 tablet (40 mg total) by mouth 2 (two) times daily. 11/18/17 11/18/18  Regalado, Jerald Kief A, MD  insulin NPH-regular Human (NOVOLIN 70/30) (70-30) 100 UNIT/ML injection Inject 18 Units into the skin 2 (two) times daily with a meal. 11/18/17   Regalado, Belkys A, MD  metroNIDAZOLE (FLAGYL) 500 MG tablet Take 1 tablet (500 mg total) by mouth every 12 (twelve) hours. 12/26/17   Donne Hazel, MD  Multiple Vitamins-Minerals (MULTIVITAMIN WITH MINERALS) tablet Take 1 tablet by mouth daily.    [provider]  predniSONE (DELTASONE) 10 MG tablet Take 1 tablet (10 mg total) by mouth daily with breakfast. 12/27/17   Donne Hazel, MD    Family History Family History  Problem Relation Age of Onset  . Diabetes Mellitus II Sister   . Stroke Brother     Social History Social History   Tobacco Use  . Smoking status: Never Smoker  . Smokeless tobacco: Never Used  Substance Use Topics  . Alcohol use: Never    Frequency: Never  . Drug use: Never     Allergies   Heparin   Review of Systems Review of Systems  Respiratory: Positive for cough and shortness of breath.   Cardiovascular: Positive for leg swelling (chronic).  Neurological: Positive for weakness.  All other systems reviewed and are negative.    Physical Exam Updated Vital Signs BP (!) 152/78   Pulse 87   Resp (!) 21   SpO2 97%   Physical Exam  Constitutional: He is oriented to person, place, and time. No distress.  Elderly, chronically ill-appearing male.  HENT:    Head: Normocephalic and atraumatic.  Eyes: Pupils are equal, round, and reactive to light. Conjunctivae and EOM are normal.  Neck: Normal range of motion. Neck supple.  Cardiovascular: Normal rate, regular rhythm and intact distal pulses.  Pulmonary/Chest: Effort normal. No respiratory distress. He has no wheezes. He has rales.  Scattered crackles in RLL. In no respiratory distress  Abdominal: Soft. He exhibits no distension and no mass. There is no tenderness. There is no guarding.  No TTP of the abd. Soft without rigidity, guarding or distention  Musculoskeletal: Normal range of motion. He exhibits edema.  BLE 2+ pitting edema. Pedal pulses intact.   Neurological: He is alert and oriented to person, place, and time.  Skin: Skin is warm and dry.  Psychiatric: He has a normal mood and affect.  Nursing note and vitals reviewed.    ED Treatments / Results  Labs (all labs ordered are listed, but only abnormal results are displayed) Labs Reviewed  BASIC METABOLIC PANEL - Abnormal; Notable for the following components:      Result Value   CO2 14 (*)    Glucose, Bld 140 (*)    BUN 95 (*)    Creatinine, Ser 4.90 (*)    Calcium 7.1 (*)    GFR calc non Af Amer 11 (*)    GFR calc Af Amer 13 (*)    All other components within normal limits  CBC - Abnormal; Notable for the following components:   WBC 3.0 (*)    RBC 3.63 (*)    Hemoglobin 9.1 (*)    HCT 29.3 (*)    MCH 25.1 (*)    RDW 17.7 (*)    Platelets 72 (*)    All other components within normal limits  BRAIN NATRIURETIC PEPTIDE - Abnormal; Notable for the following components:   B Natriuretic Peptide 1,814.1 (*)    All other components within normal limits  I-STAT TROPONIN, ED    EKG EKG Interpretation  Date/Time:  Friday January 16 2018 17:19:41 EDT Ventricular Rate:  78 PR Interval:  158 QRS Duration: 68 QT Interval:  400 QTC Calculation: 456 R Axis:   24 Text Interpretation:  Normal sinus rhythm Nonspecific ST  abnormality Abnormal ECG Confirmed by Sherwood Gambler [phone]) on 01/16/2018 9:05:00 PM   Radiology Dg Chest 2 View  Result Date: 01/16/2018 CLINICAL DATA:  Cough for 3 days, fever, shortness of breath EXAM: CHEST - 2 VIEW COMPARISON:  12/22/2017 FINDINGS: Enlargement of cardiac silhouette with pulmonary vascular congestion. Interstitial infiltrates consistent with pulmonary edema, slightly improved from previous exam. No pleural effusion or pneumothorax. Bones unremarkable. Incidentally noted atherosclerotic calcification at the aortic arch. IMPRESSION: Improved pulmonary edema versus 12/22/2017. Electronically Signed   By: Lavonia Dana M.D.   On: 01/16/2018 17:03    Procedures Procedures (including critical care time)  Medications Ordered in ED Medications  furosemide (LASIX) injection 40 mg (40 mg Intravenous Given 01/16/18 2139)     Initial Impression / Assessment and Plan / ED Course  I have reviewed the triage vital signs and the nursing notes.  Pertinent labs & imaging results that were available during my care of the patient were reviewed by me and considered in my medical decision making (see chart for details).     Pt presenting for evaluation of worsening shortness of breath/dyspnea on exertion.  Has new cough.  Physical exam shows elderly male in no acute distress.  Significant medical history.  Labs concerning, creatinine elevated, BNP elevated.  Crackles heard on right lower lobe, chest x-ray shows likely fluid overload.  Will give dose of Lasix and call for admission for shortness of breath.  Discussed with attending, Dr. Regenia Skeeter evaluated the patient.  Discussed with hospitalist, patient to be admitted to [facility] service.   Final Clinical Impressions(s) / ED Diagnoses   Final diagnoses:  DOE (dyspnea on exertion)  Cough    ED Discharge Orders    None       Franchot Heidelberg, PA-C 01/16/18 2148    Sherwood Gambler, MD 01/17/18 2156

## 2018-01-17 ENCOUNTER — Other Ambulatory Visit: Payer: Self-pay

## 2018-01-17 DIAGNOSIS — K7460 Unspecified cirrhosis of liver: Secondary | ICD-10-CM

## 2018-01-17 DIAGNOSIS — R188 Other ascites: Secondary | ICD-10-CM | POA: Diagnosis not present

## 2018-01-17 DIAGNOSIS — R0609 Other forms of dyspnea: Secondary | ICD-10-CM | POA: Diagnosis not present

## 2018-01-17 DIAGNOSIS — N184 Chronic kidney disease, stage 4 (severe): Secondary | ICD-10-CM

## 2018-01-17 DIAGNOSIS — I132 Hypertensive heart and chronic kidney disease with heart failure and with stage 5 chronic kidney disease, or end stage renal disease: Secondary | ICD-10-CM | POA: Diagnosis not present

## 2018-01-17 DIAGNOSIS — I5033 Acute on chronic diastolic (congestive) heart failure: Secondary | ICD-10-CM | POA: Diagnosis not present

## 2018-01-17 DIAGNOSIS — D631 Anemia in chronic kidney disease: Secondary | ICD-10-CM | POA: Diagnosis not present

## 2018-01-17 DIAGNOSIS — N186 End stage renal disease: Secondary | ICD-10-CM | POA: Diagnosis not present

## 2018-01-17 DIAGNOSIS — I130 Hypertensive heart and chronic kidney disease with heart failure and stage 1 through stage 4 chronic kidney disease, or unspecified chronic kidney disease: Secondary | ICD-10-CM | POA: Diagnosis not present

## 2018-01-17 DIAGNOSIS — N2883 Nephroptosis: Secondary | ICD-10-CM | POA: Diagnosis not present

## 2018-01-17 DIAGNOSIS — N2581 Secondary hyperparathyroidism of renal origin: Secondary | ICD-10-CM | POA: Diagnosis not present

## 2018-01-17 DIAGNOSIS — E1122 Type 2 diabetes mellitus with diabetic chronic kidney disease: Secondary | ICD-10-CM | POA: Diagnosis not present

## 2018-01-17 DIAGNOSIS — R0602 Shortness of breath: Secondary | ICD-10-CM | POA: Diagnosis not present

## 2018-01-17 DIAGNOSIS — D61818 Other pancytopenia: Secondary | ICD-10-CM | POA: Diagnosis not present

## 2018-01-17 LAB — RENAL FUNCTION PANEL
Albumin: 3 g/dL — ABNORMAL LOW (ref 3.5–5.0)
Anion gap: 10 (ref 5–15)
BUN: 98 mg/dL — ABNORMAL HIGH (ref 6–20)
CO2: 15 mmol/L — ABNORMAL LOW (ref 22–32)
Calcium: 7 mg/dL — ABNORMAL LOW (ref 8.9–10.3)
Chloride: 110 mmol/L (ref 101–111)
Creatinine, Ser: 4.91 mg/dL — ABNORMAL HIGH (ref 0.61–1.24)
GFR calc Af Amer: 13 mL/min — ABNORMAL LOW (ref >60)
GFR calc non Af Amer: 11 mL/min — ABNORMAL LOW (ref >60)
Glucose, Bld: 215 mg/dL — ABNORMAL HIGH (ref 65–99)
Phosphorus: 6.8 mg/dL — ABNORMAL HIGH (ref 2.5–4.6)
Potassium: 4.3 mmol/L (ref 3.5–5.1)
Sodium: 135 mmol/L (ref 135–145)

## 2018-01-17 LAB — FERRITIN: FERRITIN: 563 ng/mL — AB (ref 24–336)

## 2018-01-17 LAB — APTT: aPTT: 33 seconds (ref 24–36)

## 2018-01-17 LAB — BASIC METABOLIC PANEL
Anion gap: 10 (ref 5–15)
BUN: 96 mg/dL — AB (ref 6–20)
CHLORIDE: 109 mmol/L (ref 101–111)
CO2: 15 mmol/L — AB (ref 22–32)
Calcium: 6.9 mg/dL — ABNORMAL LOW (ref 8.9–10.3)
Creatinine, Ser: 4.72 mg/dL — ABNORMAL HIGH (ref 0.61–1.24)
GFR calc Af Amer: 14 mL/min — ABNORMAL LOW (ref >60)
GFR calc non Af Amer: 12 mL/min — ABNORMAL LOW (ref >60)
GLUCOSE: 180 mg/dL — AB (ref 65–99)
Potassium: 4.1 mmol/L (ref 3.5–5.1)
Sodium: 134 mmol/L — ABNORMAL LOW (ref 135–145)

## 2018-01-17 LAB — AMMONIA: Ammonia: 51 umol/L — ABNORMAL HIGH (ref 9–35)

## 2018-01-17 LAB — GLUCOSE, CAPILLARY
GLUCOSE-CAPILLARY: 102 mg/dL — AB (ref 65–99)
Glucose-Capillary: 154 mg/dL — ABNORMAL HIGH (ref 65–99)
Glucose-Capillary: 209 mg/dL — ABNORMAL HIGH (ref 65–99)
Glucose-Capillary: 53 mg/dL — ABNORMAL LOW (ref 65–99)
Glucose-Capillary: 78 mg/dL (ref 65–99)

## 2018-01-17 LAB — PROTIME-INR
INR: 1.28
Prothrombin Time: 15.9 seconds — ABNORMAL HIGH (ref 11.4–15.2)

## 2018-01-17 LAB — RETICULOCYTES
RBC.: 3.45 MIL/uL — AB (ref 4.22–5.81)
RETIC COUNT ABSOLUTE: 38 10*3/uL (ref 19.0–186.0)
RETIC CT PCT: 1.1 % (ref 0.4–3.1)

## 2018-01-17 LAB — MAGNESIUM: Magnesium: 2.6 mg/dL — ABNORMAL HIGH (ref 1.7–2.4)

## 2018-01-17 LAB — CBC
HCT: 26.7 % — ABNORMAL LOW (ref 39.0–52.0)
Hemoglobin: 8.4 g/dL — ABNORMAL LOW (ref 13.0–17.0)
MCH: 25.1 pg — ABNORMAL LOW (ref 26.0–34.0)
MCHC: 31.5 g/dL (ref 30.0–36.0)
MCV: 79.9 fL (ref 78.0–100.0)
Platelets: 83 10*3/uL — ABNORMAL LOW (ref 150–400)
RBC: 3.34 MIL/uL — ABNORMAL LOW (ref 4.22–5.81)
RDW: 17.6 % — AB (ref 11.5–15.5)
WBC: 3 10*3/uL — ABNORMAL LOW (ref 4.0–10.5)

## 2018-01-17 LAB — FOLATE: Folate: 34.5 ng/mL (ref >5.9)

## 2018-01-17 LAB — VITAMIN B12: Vitamin B-12: 1195 pg/mL — ABNORMAL HIGH (ref 180–914)

## 2018-01-17 LAB — IRON AND TIBC
Iron: 60 ug/dL (ref 45–182)
Saturation Ratios: 25 % (ref 17.9–39.5)
TIBC: 238 ug/dL — ABNORMAL LOW (ref 250–450)
UIBC: 178 ug/dL

## 2018-01-17 MED ORDER — PRO-STAT SUGAR FREE PO LIQD
30.0000 mL | Freq: Two times a day (BID) | ORAL | Status: DC
  Administered 2018-01-18 – 2018-01-26 (×12): 30 mL via ORAL
  Filled 2018-01-17 (×14): qty 30

## 2018-01-17 MED ORDER — CALCIUM ACETATE (PHOS BINDER) 667 MG PO CAPS
2001.0000 mg | ORAL_CAPSULE | Freq: Three times a day (TID) | ORAL | Status: DC
  Administered 2018-01-18 – 2018-01-26 (×17): 2001 mg via ORAL
  Filled 2018-01-17 (×21): qty 3

## 2018-01-17 MED ORDER — ALBUTEROL SULFATE (2.5 MG/3ML) 0.083% IN NEBU: 2.5000 mg | INHALATION_SOLUTION | RESPIRATORY_TRACT | Status: DC | PRN

## 2018-01-17 MED ORDER — FUROSEMIDE 10 MG/ML IJ SOLN
120.0000 mg | Freq: Two times a day (BID) | INTRAVENOUS | Status: DC
  Administered 2018-01-17: 120 mg via INTRAVENOUS
  Filled 2018-01-17: qty 10
  Filled 2018-01-17: qty 12

## 2018-01-17 MED ORDER — LACTULOSE 10 GM/15ML PO SOLN
20.0000 g | Freq: Two times a day (BID) | ORAL | Status: DC
  Administered 2018-01-17 – 2018-01-26 (×9): 20 g via ORAL
  Filled 2018-01-17 (×15): qty 30

## 2018-01-17 MED ORDER — FUROSEMIDE 10 MG/ML IJ SOLN
120.0000 mg | Freq: Two times a day (BID) | INTRAMUSCULAR | Status: DC
  Filled 2018-01-17: qty 12

## 2018-01-17 MED ORDER — FUROSEMIDE 10 MG/ML IJ SOLN
40.0000 mg | Freq: Once | INTRAMUSCULAR | Status: DC
  Filled 2018-01-17: qty 4

## 2018-01-17 MED ORDER — FUROSEMIDE 10 MG/ML IJ SOLN
160.0000 mg | Freq: Three times a day (TID) | INTRAVENOUS | Status: DC
  Administered 2018-01-17 – 2018-01-18 (×5): 160 mg via INTRAVENOUS
  Filled 2018-01-17 (×8): qty 16

## 2018-01-17 NOTE — Progress Notes (Addendum)
Hypoglycemic Event  CBG: 53 at 1635  Treatment: Orange Juice and Chocolate Pudding  Symptoms: NONE  Follow-up CBG: Time: 2548 CBG Result: 78  Possible Reasons for Event: POOR PO INTAKE  Comments/MD notified: DR JOSEPH NOTIFIED.  WILL HOLD DINNER DOSE OF 70/30 AND SSI    Nechama Guard

## 2018-01-17 NOTE — Progress Notes (Signed)
Patient's son is in the room to translate for patient since he speaks Arabic.  Patient seems alert and was able to walk to the bathroom w/o difficulties.  Son will stay the night with him.  I will keep monitoring the patient.

## 2018-01-17 NOTE — Consult Note (Addendum)
[facility] Renal Consultation Note    Indication for Consultation:  Management of ESRD/hemodialysis; anemia, hypertension/volume and secondary hyperparathyroidism PCP: Dr. Burnadette Pop Oncology: Dr. Mayme Genta Nephrologist: Dr. Posey Pronto  HPI: William [name] is a 67 y.o. male with CKD stage 4 followed at [place] by Dr. Posey Pronto. PMH significant for long standing DM, HTN, non-alcoholic cirrhosis with portal hypertension, acute on chronic kidney injury, Goodpasture disease with anti-GBM positive in conjunction with hemoptysis treated with plasmapheresis/steriods/cytoxin until renal biopsy revealed diabetic kidney disease with mod-severe sclerosis and secondary FSGS, pancytopenia 2/2 Goodpasture syndrome, AOCD, SHPT. He had LUA AVF placed 12/26/17 per Dr. Bridgett Larsson. He has not started HD yet. Patient is from [place] and only speaks Arabic.   Patient presented to ED 01/16/2018 with C/O SOB which has progressively worsened over past three days with associated C/O nausea, worsening LE edema, abdominal pain, weakness, fatique and ascites. He was afebrile on admission, BP 144/64 HR 69. Na 136 Co2 14 Scr 4.9 EGFR 11 HGB 9.1 WBC 3.0 PLT 72. BNP 1814,  CXR showed interstitial infiltrates consistent with pulmonary edema, enlargement of cardiac silhouette. He has been admitted for volume overload 2/2 to CKD stage 4 and CHF exacerbation per primary. He has been started on IV lasix per primary.   Patient being seen in room with son acting as Optometrist. He says he feels better now, breathing is better, but he is tired. He denies SOB/Chest pain at present, C/O tightness in abdominal area, still has BLE edema R>L. Patient denies fever, chills, had nausea and mild diarrhea prior to admission-now resolved. Has DOE, denies orthopnea, hemoptysis.  Denies flank pain, dysuria, hematuria, has nocturia 2-3 times nightly. Urinates small amounts of urine. Denies dysgeusia, has very poor appetite, very low energy levels, lies in bed most  of time. Denies pruritus, no unusual jerking movements, no asterixis.   Son says they live close to [facility] and if HD is necessary for his father to feel better and control the fluid, patient is willing to start renal replacement therapy.     Past Medical History:  Diagnosis Date  . Anemia   . CHF (congestive heart failure) ([place])   . Cirrhosis ([place])   . Cirrhosis ([place])   . CKD (chronic kidney disease), stage IV ([place])   . Goodpasture's disease [facility])    on outpatient plasmapheresis/notes 11/20/2017  . Grade I diastolic dysfunction 75/44/9201  . History of anemia due to chronic kidney disease   . History of blood transfusion X 1   "UGIB; low blood count"  . History of plasmapheresis    "qod" (11/20/2017)  . Hypertension   . Pancytopenia ([place]) 08/20/2017  . Type II diabetes mellitus ([place])    Past Surgical History:  Procedure Laterality Date  . AV FISTULA PLACEMENT Left 12/26/2017   Procedure: LEFT BRACHIOCEPHALIC ARTERIOVENOUS (AV) FISTULA CREATION;  Surgeon: Conrad Sabula, MD;  Location: [place];  Service: Vascular;  Laterality: Left;  . CATARACT EXTRACTION W/ INTRAOCULAR LENS  IMPLANT, BILATERAL Bilateral   . IR FLUORO GUIDE CV LINE RIGHT  11/10/2017  . IR PARACENTESIS  12/10/2017  . IR PARACENTESIS  12/26/2017  . IR REMOVAL TUN CV CATH W/O FL  11/28/2017  . IR US GUIDE VASC ACCESS RIGHT  11/10/2017  . NECK SURGERY  2007   "back of neck; no hardware in there"   Family History  Problem Relation Age of Onset  . Diabetes Mellitus II Sister   . Stroke Brother  Social History:  reports that he has never smoked. He has never used smokeless tobacco. He reports that he does not drink alcohol or use drugs. Allergies  Allergen Reactions  . Heparin     Patient is Muslim and is not permitted Pork derative due to religious belief)   Prior to Admission medications   Medication Sig Start Date End Date Taking? Authorizing Provider  acetaminophen (TYLENOL) 650 MG CR  tablet Take 650 mg by mouth every 8 (eight) hours as needed for pain.   Yes [provider]  amLODipine (NORVASC) 10 MG tablet Take 1 tablet (10 mg total) daily by mouth. 06/20/17 11/06/24 Yes Gildardo Pounds, NP  calcium acetate (PHOSLO) 667 MG capsule Take 2 capsules (1,334 mg total) by mouth 3 (three) times daily with meals. 12/26/17  Yes Donne Hazel, MD  carvedilol (COREG) 12.5 MG tablet Take 12.5 mg by mouth 2 (two) times daily with a meal.   Yes [provider]  ferrous gluconate (IRON 27) 240 (27 FE) MG tablet Take 240 mg by mouth daily.    Yes [provider]  furosemide (LASIX) 40 MG tablet Take 1 tablet (40 mg total) by mouth 2 (two) times daily. Patient taking differently: Take 40-80 mg by mouth See admin instructions. Take 2 tablets by mouth every morning and take 1 tablet every evening 11/18/17 11/18/18 Yes Regalado, Belkys A, MD  insulin NPH-regular Human (NOVOLIN 70/30) (70-30) 100 UNIT/ML injection Inject 18 Units into the skin 2 (two) times daily with a meal. 11/18/17  Yes Regalado, Belkys A, MD  Multiple Vitamins-Minerals (MULTIVITAMIN WITH MINERALS) tablet Take 1 tablet by mouth daily.   Yes [provider]  metroNIDAZOLE (FLAGYL) 500 MG tablet Take 1 tablet (500 mg total) by mouth every 12 (twelve) hours. Patient not taking: Reported on 01/16/2018 12/26/17   Donne Hazel, MD  predniSONE (DELTASONE) 10 MG tablet Take 1 tablet (10 mg total) by mouth daily with breakfast. Patient not taking: Reported on 01/16/2018 12/27/17   Donne Hazel, MD   Current Facility-Administered Medications  Medication Dose Route Frequency Provider Last Rate Last Dose  . albuterol (PROVENTIL) (2.5 MG/3ML) 0.083% nebulizer solution 2.5 mg  2.5 mg Nebulization Q4H PRN Ivor Costa, MD      . amLODipine (NORVASC) tablet 10 mg  10 mg Oral Daily Ivor Costa, MD   10 mg at 01/17/18 0920  . calcium acetate (PHOSLO) capsule 1,334 mg  1,334 mg Oral TID WC Ivor Costa, MD   1,334  mg at 01/17/18 0919  . carvedilol (COREG) tablet 12.5 mg  12.5 mg Oral BID WC Ivor Costa, MD   12.5 mg at 01/17/18 0919  . ferrous gluconate (FERGON) tablet 324 mg  324 mg Oral Daily Ivor Costa, MD   324 mg at 01/17/18 0920  . furosemide (LASIX) 120 mg in dextrose 5 % 50 mL IVPB  120 mg Intravenous Q12H Domenic Polite, MD      . hydrALAZINE (APRESOLINE) injection 5 mg  5 mg Intravenous Q2H PRN Ivor Costa, MD      . insulin aspart (novoLOG) injection 0-9 Units  0-9 Units Subcutaneous TID WC Ivor Costa, MD   2 Units at 01/17/18 [phone]  . insulin aspart protamine- aspart (NOVOLOG MIX 70/30) injection 12 Units  12 Units Subcutaneous BID WC Ivor Costa, MD   12 Units at 01/17/18 [phone]  . lactulose (CHRONULAC) 10 GM/15ML solution 20 g  20 g Oral BID Ivor Costa, MD   20 g  at 01/17/18 0921  . multivitamin with minerals tablet 1 tablet  1 tablet Oral Daily Ivor Costa, MD   1 tablet at 01/17/18 0920  . ondansetron (ZOFRAN) injection 4 mg  4 mg Intravenous Q8H PRN Ivor Costa, MD      . zolpidem (AMBIEN) tablet 5 mg  5 mg Oral QHS PRN Ivor Costa, MD       Labs: Basic Metabolic Panel: Recent Labs  Lab 01/16/18 1645 01/16/18 1731 01/17/18 0142  NA 136 135 134*  K 4.5 4.5 4.1  CL 106 109 109  CO2  --  14* 15*  GLUCOSE 145* 140* 180*  BUN 96* 95* 96*  CREATININE 5.20* 4.90* 4.72*  CALCIUM  --  7.1* 6.9*   Liver Function Tests: No results for input(s): AST, ALT, ALKPHOS, BILITOT, PROT, ALBUMIN in the last 168 hours. No results for input(s): LIPASE, AMYLASE in the last 168 hours. Recent Labs  Lab 01/17/18 0452  AMMONIA 51*   CBC: Recent Labs  Lab 01/16/18 1645 01/16/18 1731 01/17/18 0142  WBC  --  3.0* 3.0*  HGB 9.5* 9.1* 8.4*  HCT 28.0* 29.3* 26.7*  MCV  --  80.7 79.9  PLT  --  72* 83*   Cardiac Enzymes: No results for input(s): CKTOTAL, CKMB, CKMBINDEX, TROPONINI in the last 168 hours. CBG: Recent Labs  Lab 01/17/18 0827 01/17/18 1142  GLUCAP 154* 209*   Iron Studies:  Recent  Labs    01/14/18 1310  IRON 80  TIBC 242*  FERRITIN 583*   Studies/Results: Dg Chest 2 View  Result Date: 01/16/2018 CLINICAL DATA:  Cough for 3 days, fever, shortness of breath EXAM: CHEST - 2 VIEW COMPARISON:  12/22/2017 FINDINGS: Enlargement of cardiac silhouette with pulmonary vascular congestion. Interstitial infiltrates consistent with pulmonary edema, slightly improved from previous exam. No pleural effusion or pneumothorax. Bones unremarkable. Incidentally noted atherosclerotic calcification at the aortic arch. IMPRESSION: Improved pulmonary edema versus 12/22/2017. Electronically Signed   By: Lavonia Dana M.D.   On: 01/16/2018 17:03    ROS: As per HPI otherwise negative.   Physical Exam: Vitals:   01/16/18 2315 01/16/18 2330 01/17/18 0007 01/17/18 0919  BP: (!) 146/69 140/72 (!) 144/83 (!) 144/64  Pulse: 82 80 86 89  Resp: 16 17 20    Temp:   98.3 F (36.8 C)   TempSrc:   Oral   SpO2: 98% 95% 99%   Weight:   67.9 kg (149 lb 9.6 oz)   Height:   5' 5"  (1.651 m)      General: Head: Normocephalic, atraumatic, sclera slightly icteric, mucus membranes are moist Neck: Supple. JVD elevated 1/2 to mandible.  Lungs: Bilateral breath sounds sl decreased in bases with bibasilar crackles RLL > LLL. Breathing is unlabored. Heart: RRR with S1 S2. No murmurs, rubs, or gallops appreciated. Abdomen: Abdomen taut with ascites present. Active BS.  M-S:  Strength and tone appear normal for age. Lower extremities: 2+ RLE edema, trace-1+ LLE edema. Neuro: Alert and oriented X 3. Moves all extremities spontaneously. No asterixis.  Psych:  Responds to questions appropriately with a normal affect. Dialysis Access: Maturing LUA AVF strong bruit throughout but not developing well.   Assessment/Plan: 1.  CKD stage 4/5 2/2 DM nephrosclerosis: Declining renal function-SCr 4.9 EGFR now down to 11-12. (SCr 3.55 EGFR 17 01/07/18)/ Patient has symptoms of uremia with volume excess. CXR shows pulmonary  edema. Will need to place [facility] and start HD if volume excess does not respond to diuretic therapy,  probably this admission. Furosemide increased to 120 mg IV q 12 hours per primary-1st dose just given. Net I & O -95 at present.  2. Acute on chronic diastolic HF-grade 1 diastolic dysfunction 84-13%. SOB improved with IV diuretics.  3.  Hypertension/volume  -BP fairly controlled on amlodipine and carvedilol. See # 1 4.  Anemia  - HGB 8.4. Fe 80 Tsat 33%. Went to infusion clinic 01/14/18-rec'd Epogen 10,000 units 01/14/18. Follow HGB.  5.  Metabolic bone disease -  Ca 6.9, add renal function panel to today's labs. Has been on calcium acetate binders, will continue.  6.  Nutrition - Renal/Carb mod diet with fld restrictions. Check albumin.  7. DM-per primary.  8. Pancytopenia-followed by oncology. Thought to be autoimmune process 2/2 to Goodpasture disease.  9. Goodpasture disease-followed by oncology and pulmonary  10. Non-alcoholic cirrhosis with ascites-needs paracentesis. Ammonia level 51. On Lactulose per primary.   Cincere Zorn H. Owens Shark, NP-C 01/17/2018, 12:09 PM  [employer] [facility] [phone]

## 2018-01-17 NOTE — Progress Notes (Addendum)
PROGRESS NOTE    William [name]  QIO:[id number] DOB: [birth year] DOA: 01/16/2018 PCP: Gildardo Pounds, NP  Brief Narrative: 67 y.o. male with medical history of CKD4 (AVF 5/24), DM, goodpasture syndrome, pancytopenia, hypertension, upper GI bleeding, cirrhosis with ascites, diastolic CHF who presented with shortness of breath. 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  -On IV  Lasix as above  Type II diabetes mellitus with renal manifestations ([place]) -: Last A1c 8.1 on 06/20/17, poorly controled. - decreased 70/30 insulin dose from  18 to 12 U bid -SSI, monitor  Pancytopenia ([place]):  -likely secondary to cirrhosis/splenic sequestration, And CKD5 -Stable, monitor check anemia panel  HTN:  -Continue home medications: Amlodipine, Coreg, -IV hydralazine prn  Cirrhosis of liver with ascites ([place]):  -etiology of this is  unclear -Mental status stable, Ammonia level is elevated at 51 -continue lactulose -exam with moderate ascites, may need paracentesis this admission  DVT ppx: SCDs due to low plts Code Status: Full code Family Communication: Yes, patient's son  at bed side Disposition Plan:  Anticipate discharge back to previous home environment   Consults: Renal   Procedures:   Antimicrobials:    Subjective: - ongoing dyspnea on exertion, lower extremity edema, poor appetite  Objective: Vitals:   01/16/18 2315 01/16/18 2330 01/17/18 0007 01/17/18 0919  BP: (!) 146/69 140/72 (!) 144/83 (!) 144/64  Pulse: 82 80 86 89  Resp: 16 17 20    Temp:   98.3 F (36.8 C)   TempSrc:   Oral   SpO2: 98% 95% 99%   Weight:   67.9 kg (149 lb 9.6 oz)   Height:   5' 5"  (1.651 m)     Intake/Output Summary (Last 24 hours) at 01/17/2018 1045 Last data filed at 01/17/2018 0900 Gross per 24 hour  Intake 180 ml  Output 275 ml  Net -95 ml   Filed Weights   01/17/18 0007  Weight: 67.9 kg (149 lb 9.6 oz)    Examination:  General exam: Appears calm and comfortable, wing in bed, no distress Respiratory system: rales at both bases Cardiovascular system: S1 & S2 heard, RRR. Positive JVD Gastrointestinal system: Abdomen is nondistended, soft and nontender.Normal bowel sounds heard. Central nervous system: Alert and oriented. No focal neurological deficits. Extremities:2+ edema Skin: No rashes, lesions or ulcers Psychiatry: Mood & affect appropriate.     Data Reviewed:   CBC: Recent Labs  Lab 01/14/18 1314 01/16/18 1645 01/16/18 1731 01/17/18 0142  WBC  --   --  3.0* 3.0*  HGB 9.5* 9.5* 9.1* 8.4*  HCT  --  28.0* 29.3* 26.7*  MCV  --   --  80.7 79.9  PLT  --   --  72* 83*   Basic Metabolic Panel: Recent Labs  Lab 01/16/18 1645 01/16/18 1731 01/17/18 0142  NA 136 135 134*  K 4.5 4.5 4.1  CL 106 109 109  CO2  --  14* 15*  GLUCOSE 145* 140* 180*  BUN 96* 95* 96*  CREATININE 5.20* 4.90*  4.72*  CALCIUM  --  7.1* 6.9*   GFR: Estimated Creatinine Clearance: 13.4 mL/min (A) (by C-G formula based on SCr of 4.72 mg/dL (H)). Liver Function Tests: No results for input(s): AST, ALT, ALKPHOS, BILITOT, PROT, ALBUMIN in the last 168 hours. No results for input(s): LIPASE, AMYLASE in the last 168 hours. Recent Labs  Lab 01/17/18 0452  AMMONIA 51*   Coagulation Profile: Recent Labs  Lab 01/17/18 0142  INR 1.28   Cardiac Enzymes: No results for input(s): CKTOTAL, CKMB, CKMBINDEX, TROPONINI in the last 168 hours. BNP (last 3 results) Recent Labs    09/25/17 1614  PROBNP 380.0*   HbA1C: No results for input(s): HGBA1C in the last 72 hours. CBG: Recent Labs  Lab 01/17/18 0827  GLUCAP 154*   Lipid Profile: No results for input(s): CHOL, HDL, LDLCALC, TRIG, CHOLHDL, LDLDIRECT in the last 72 hours. Thyroid Function Tests: No results for input(s): TSH, T4TOTAL, FREET4, T3FREE, THYROIDAB in the last 72 hours. Anemia Panel: Recent Labs    01/14/18 1310  FERRITIN 583*  TIBC 242*  IRON 80   Urine analysis:    Component Value Date/Time   COLORURINE YELLOW 12/19/2017 2230   APPEARANCEUR CLEAR 12/19/2017 2230   LABSPEC 1.011 12/19/2017 2230   PHURINE 7.0 12/19/2017 2230   GLUCOSEU NEGATIVE 12/19/2017 2230   HGBUR SMALL (A) 12/19/2017 2230   BILIRUBINUR NEGATIVE 12/19/2017 2230   BILIRUBINUR neg 09/14/2015 1441   KETONESUR NEGATIVE 12/19/2017 2230   PROTEINUR 100 (A) 12/19/2017 2230   UROBILINOGEN 0.2 09/14/2015 1441   UROBILINOGEN 0.2 01/27/2013 2052   NITRITE NEGATIVE 12/19/2017 2230   LEUKOCYTESUR NEGATIVE 12/19/2017 2230   Sepsis Labs: @LABRCNTIP (procalcitonin:4,lacticidven:4)  )No results found for this or any previous visit (from the past 240 hour(s)).       Radiology Studies: Dg Chest 2 View  Result Date: 01/16/2018 CLINICAL DATA:  Cough for 3 days, fever, shortness of breath EXAM: CHEST - 2 VIEW COMPARISON:  12/22/2017 FINDINGS: Enlargement of  cardiac silhouette with pulmonary vascular congestion. Interstitial infiltrates consistent with pulmonary edema, slightly improved from previous exam. No pleural effusion or pneumothorax. Bones unremarkable. Incidentally noted atherosclerotic calcification at the aortic arch. IMPRESSION: Improved pulmonary edema versus 12/22/2017. Electronically Signed   By: Lavonia Dana M.D.   On: 01/16/2018 17:03        Scheduled Meds: . amLODipine  10 mg Oral Daily  . calcium acetate  1,334 mg Oral TID WC  . carvedilol  12.5 mg Oral BID WC  . ferrous gluconate  324 mg Oral Daily  . furosemide  40 mg Intravenous Once  . insulin aspart  0-9 Units Subcutaneous TID WC  . insulin aspart protamine- aspart  12 Units Subcutaneous BID WC  . lactulose  20 g Oral BID  . multivitamin with minerals  1 tablet Oral Daily   Continuous Infusions: . furosemide       LOS: 1 day    Time spent:  2mn    PDomenic Polite MD [facility] Page via [email or web address], password [email or web address] After 7PM please contact night-coverage  01/17/2018, 10:45 AM

## 2018-01-17 NOTE — Progress Notes (Signed)
At 1224 patient had 4 beats VTACH.  Patient asymptomatic and sleeping per charge nurse Ebony Hail.  Will notify Dr Broadus John.

## 2018-01-18 DIAGNOSIS — N2581 Secondary hyperparathyroidism of renal origin: Secondary | ICD-10-CM | POA: Diagnosis not present

## 2018-01-18 DIAGNOSIS — I5033 Acute on chronic diastolic (congestive) heart failure: Secondary | ICD-10-CM | POA: Diagnosis not present

## 2018-01-18 DIAGNOSIS — N184 Chronic kidney disease, stage 4 (severe): Secondary | ICD-10-CM | POA: Diagnosis not present

## 2018-01-18 DIAGNOSIS — K7460 Unspecified cirrhosis of liver: Secondary | ICD-10-CM | POA: Diagnosis not present

## 2018-01-18 DIAGNOSIS — R0602 Shortness of breath: Secondary | ICD-10-CM | POA: Diagnosis not present

## 2018-01-18 DIAGNOSIS — D631 Anemia in chronic kidney disease: Secondary | ICD-10-CM | POA: Diagnosis not present

## 2018-01-18 DIAGNOSIS — R188 Other ascites: Secondary | ICD-10-CM | POA: Diagnosis not present

## 2018-01-18 DIAGNOSIS — E1122 Type 2 diabetes mellitus with diabetic chronic kidney disease: Secondary | ICD-10-CM | POA: Diagnosis not present

## 2018-01-18 DIAGNOSIS — N2883 Nephroptosis: Secondary | ICD-10-CM | POA: Diagnosis not present

## 2018-01-18 DIAGNOSIS — N186 End stage renal disease: Secondary | ICD-10-CM | POA: Diagnosis not present

## 2018-01-18 DIAGNOSIS — R0609 Other forms of dyspnea: Secondary | ICD-10-CM | POA: Diagnosis not present

## 2018-01-18 DIAGNOSIS — D61818 Other pancytopenia: Secondary | ICD-10-CM | POA: Diagnosis not present

## 2018-01-18 DIAGNOSIS — I132 Hypertensive heart and chronic kidney disease with heart failure and with stage 5 chronic kidney disease, or end stage renal disease: Secondary | ICD-10-CM | POA: Diagnosis not present

## 2018-01-18 DIAGNOSIS — I130 Hypertensive heart and chronic kidney disease with heart failure and stage 1 through stage 4 chronic kidney disease, or unspecified chronic kidney disease: Secondary | ICD-10-CM | POA: Diagnosis not present

## 2018-01-18 LAB — CBC
HCT: 27.2 % — ABNORMAL LOW (ref 39.0–52.0)
Hemoglobin: 8.4 g/dL — ABNORMAL LOW (ref 13.0–17.0)
MCH: 24.9 pg — ABNORMAL LOW (ref 26.0–34.0)
MCHC: 30.9 g/dL (ref 30.0–36.0)
MCV: 80.7 fL (ref 78.0–100.0)
Platelets: 72 10*3/uL — ABNORMAL LOW (ref 150–400)
RBC: 3.37 MIL/uL — ABNORMAL LOW (ref 4.22–5.81)
RDW: 17.8 % — AB (ref 11.5–15.5)
WBC: 2.6 10*3/uL — ABNORMAL LOW (ref 4.0–10.5)

## 2018-01-18 LAB — COMPREHENSIVE METABOLIC PANEL
ALT: 22 U/L (ref 17–63)
ANION GAP: 9 (ref 5–15)
AST: 31 U/L (ref 15–41)
Albumin: 2.8 g/dL — ABNORMAL LOW (ref 3.5–5.0)
Alkaline Phosphatase: 93 U/L (ref 38–126)
BUN: 96 mg/dL — ABNORMAL HIGH (ref 6–20)
CO2: 17 mmol/L — AB (ref 22–32)
Calcium: 7 mg/dL — ABNORMAL LOW (ref 8.9–10.3)
Chloride: 110 mmol/L (ref 101–111)
Creatinine, Ser: 4.81 mg/dL — ABNORMAL HIGH (ref 0.61–1.24)
GFR calc non Af Amer: 11 mL/min — ABNORMAL LOW (ref >60)
GFR, EST AFRICAN AMERICAN: 13 mL/min — AB (ref >60)
GLUCOSE: 82 mg/dL (ref 65–99)
POTASSIUM: 4.4 mmol/L (ref 3.5–5.1)
SODIUM: 136 mmol/L (ref 135–145)
TOTAL PROTEIN: 5.6 g/dL — AB (ref 6.5–8.1)
Total Bilirubin: 0.9 mg/dL (ref 0.3–1.2)

## 2018-01-18 LAB — GLUCOSE, CAPILLARY
GLUCOSE-CAPILLARY: 169 mg/dL — AB (ref 65–99)
GLUCOSE-CAPILLARY: 145 mg/dL — AB (ref 65–99)
GLUCOSE-CAPILLARY: 152 mg/dL — AB (ref 65–99)
Glucose-Capillary: 138 mg/dL — ABNORMAL HIGH (ref 65–99)

## 2018-01-18 MED ORDER — INSULIN ASPART PROT & ASPART (70-30 MIX) 100 UNIT/ML ~~LOC~~ SUSP
5.0000 [IU] | Freq: Two times a day (BID) | SUBCUTANEOUS | Status: DC
  Filled 2018-01-18: qty 10

## 2018-01-18 MED ORDER — DARBEPOETIN ALFA 100 MCG/0.5ML IJ SOSY
100.0000 ug | PREFILLED_SYRINGE | INTRAMUSCULAR | Status: DC
  Administered 2018-01-20 (×2): 100 ug via INTRAVENOUS
  Filled 2018-01-18 (×2): qty 0.5

## 2018-01-18 MED ORDER — RENA-VITE PO TABS
1.0000 | ORAL_TABLET | Freq: Every day | ORAL | Status: DC
  Administered 2018-01-18 – 2018-01-25 (×8): 1 via ORAL
  Filled 2018-01-18 (×8): qty 1

## 2018-01-18 MED ORDER — CEFAZOLIN SODIUM-DEXTROSE 2-4 GM/100ML-% IV SOLN
2.0000 g | INTRAVENOUS | Status: AC
  Administered 2018-01-19: 2 g via INTRAVENOUS
  Filled 2018-01-18: qty 100

## 2018-01-18 MED ORDER — CHLORHEXIDINE GLUCONATE CLOTH 2 % EX PADS
6.0000 | MEDICATED_PAD | Freq: Every day | CUTANEOUS | Status: DC
  Administered 2018-01-18 – 2018-01-26 (×8): 6 via TOPICAL

## 2018-01-18 NOTE — Progress Notes (Signed)
[facility] Progress Note   Subjective: Patient awake without new C/Os. Marginal response to IV diuretics. Dr. Joelyn Oms present and discussed need to start hemodialysis tomorrow. Patient and son agree.   Objective Vitals:   01/17/18 1955 01/18/18 0402 01/18/18 0820 01/18/18 1044  BP: 136/73 135/60 (!) 146/68 128/61  Pulse: 77 82 85   Resp: (!) 22 (!) 22    Temp: 98.6 F (37 C) 98.4 F (36.9 C)    TempSrc: Oral Oral    SpO2: 98% 98%    Weight:  66.7 kg (147 lb 1.6 oz)    Height:       Physical Exam General: Cooperative, NAD Heart: S1,S2 RRR. SR on monitor. Still with JVD 1/3 to mandible.  Lungs: Bilateral breath sounds, decreased in bases with few bibasilar crackles.  Abdomen: active BS, probable ascites present. Softer today.  Extremities: Bilateral 2+pitting LE edema Dialysis Access: Maturing LUA AVF + bruit.   Additional Objective Labs: Basic Metabolic Panel: Recent Labs  Lab 01/17/18 0142 01/17/18 1418 01/18/18 0354  NA 134* 135 136  K 4.1 4.3 4.4  CL 109 110 110  CO2 15* 15* 17*  GLUCOSE 180* 215* 82  BUN 96* 98* 96*  CREATININE 4.72* 4.91* 4.81*  CALCIUM 6.9* 7.0* 7.0*  PHOS  --  6.8*  --    Liver Function Tests: Recent Labs  Lab 01/17/18 1418 01/18/18 0354  AST  --  31  ALT  --  22  ALKPHOS  --  93  BILITOT  --  0.9  PROT  --  5.6*  ALBUMIN 3.0* 2.8*   No results for input(s): LIPASE, AMYLASE in the last 168 hours. CBC: Recent Labs  Lab 01/16/18 1731 01/17/18 0142 01/18/18 0354  WBC 3.0* 3.0* 2.6*  HGB 9.1* 8.4* 8.4*  HCT 29.3* 26.7* 27.2*  MCV 80.7 79.9 80.7  PLT 72* 83* 72*   Blood Culture    Component Value Date/Time   SDES PERITONEAL 12/26/2017 1400   SDES PERITONEAL 12/26/2017 1400   SPECREQUEST NONE 12/26/2017 1400   SPECREQUEST NONE 12/26/2017 1400   CULT  12/26/2017 1400    NO GROWTH 5 DAYS Performed at [facility], [place] [address], [place], [place] [address]    REPTSTATUS 12/31/2017 FINAL 12/26/2017  1400   REPTSTATUS 12/26/2017 FINAL 12/26/2017 1400    Cardiac Enzymes: No results for input(s): CKTOTAL, CKMB, CKMBINDEX, TROPONINI in the last 168 hours. CBG: Recent Labs  Lab 01/17/18 1142 01/17/18 1635 01/17/18 1716 01/17/18 2049 01/18/18 0720  GLUCAP 209* 53* 78 102* 138*   Iron Studies:  Recent Labs    01/17/18 1113  IRON 60  TIBC 238*  FERRITIN 563*   @[email or web address] @ Studies/Results: Dg Chest 2 View  Result Date: 01/16/2018 CLINICAL DATA:  Cough for 3 days, fever, shortness of breath EXAM: CHEST - 2 VIEW COMPARISON:  12/22/2017 FINDINGS: Enlargement of cardiac silhouette with pulmonary vascular congestion. Interstitial infiltrates consistent with pulmonary edema, slightly improved from previous exam. No pleural effusion or pneumothorax. Bones unremarkable. Incidentally noted atherosclerotic calcification at the aortic arch. IMPRESSION: Improved pulmonary edema versus 12/22/2017. Electronically Signed   By: Lavonia Dana M.D.   On: 01/16/2018 17:03   Medications: . furosemide 160 mg (01/18/18 1047)   . amLODipine  10 mg Oral Daily  . calcium acetate  2,001 mg Oral TID WC  . carvedilol  12.5 mg Oral BID WC  . feeding supplement (PRO-STAT SUGAR FREE 64)  30 mL Oral BID  . ferrous  gluconate  324 mg Oral Daily  . insulin aspart  0-9 Units Subcutaneous TID WC  . insulin aspart protamine- aspart  5 Units Subcutaneous BID WC  . lactulose  20 g Oral BID  . multivitamin with minerals  1 tablet Oral Daily     Assessment/Plan: 1.  CKD stage 4/5 2/2 DM nephrosclerosis: Declining renal function-SCr 4.9 EGFR now down to 11-12. (SCr 3.55 EGFR 17 01/07/18)/ Patient has symptoms of uremia with volume excess. CXR shows pulmonary edema. Has had poor response to high dose IV diuretics. Discussed with patient and son-agrees to start HD. Will call [facility] and ask for placement of TDC and initiate HD tomorrow post [facility] placement. Lives near [facility]. Start CLIP process to  AF.  2. Acute on chronic diastolic HF-grade 1 diastolic dysfunction 46-28%. SOB improved with IV diuretics.  3.  Hypertension/volume  -BP fairly controlled on amlodipine and carvedilol. See # 1 4.  Anemia  - HGB 8.4 01/17/18. Fe 80 Tsat 33%. Went to infusion clinic 01/14/18-rec'd Epogen 10,000 units 01/14/18. HGB 8.4 today. Start Aranesp 100 mcg IV with HD tomorrow.  5.  Metabolic bone disease -  Ca 7.0 C Ca 7.8  Phos 6.8,  PTH pending. Increased calcium based  binders, Use 2.5 Ca bath tomorrow with HD.  6.  Nutrition - Albumin 3.0 Renal/Carb mod diet with fld restrictions. 7. DM-per primary.  8. Pancytopenia-followed by oncology. Thought to be autoimmune process 2/2 to Goodpasture disease.  9. Goodpasture disease-followed by oncology and pulmonary  10. Non-alcoholic cirrhosis with ascites-needs paracentesis. Ammonia level 51. On Lactulose per primary.    Davone Shinault H. Adamari Frede NP-C 01/18/2018, 11:20 AM  [employer] [phone]

## 2018-01-18 NOTE — H&P (View-Only) (Signed)
Hospital Consult    Reason for Consult:  Need for tdc Referring Physician:  Dr. Joelyn Oms MRN #:  [id number]  History of Present Illness: This is a 67 y.o. male with Goodpasture disease, hypertension and chronic kidney disease.  He has now progressed to need dialysis.  Fistula was placed a few weeks ago will need super fistulization prior to use he is still over 2 months out from having a suitable fistula.  He is now in need of access for dialysis with uremia.  Previously had thrombocytopenia.  Past Medical History:  Diagnosis Date  . Anemia   . CHF (congestive heart failure) ([place])   . Cirrhosis ([place])   . Cirrhosis ([place])   . CKD (chronic kidney disease), stage IV ([place])   . Goodpasture's disease [facility])    on outpatient plasmapheresis/notes 11/20/2017  . Grade I diastolic dysfunction 74/07/8785  . History of anemia due to chronic kidney disease   . History of blood transfusion X 1   "UGIB; low blood count"  . History of plasmapheresis    "qod" (11/20/2017)  . Hypertension   . Pancytopenia ([place]) 08/20/2017  . Type II diabetes mellitus ([place])     Past Surgical History:  Procedure Laterality Date  . AV FISTULA PLACEMENT Left 12/26/2017   Procedure: LEFT BRACHIOCEPHALIC ARTERIOVENOUS (AV) FISTULA CREATION;  Surgeon: Conrad Mashpee Neck, MD;  Location: [place];  Service: Vascular;  Laterality: Left;  . CATARACT EXTRACTION W/ INTRAOCULAR LENS  IMPLANT, BILATERAL Bilateral   . IR FLUORO GUIDE CV LINE RIGHT  11/10/2017  . IR PARACENTESIS  12/10/2017  . IR PARACENTESIS  12/26/2017  . IR REMOVAL TUN CV CATH W/O FL  11/28/2017  . IR US GUIDE VASC ACCESS RIGHT  11/10/2017  . NECK SURGERY  2007   "back of neck; no hardware in there"    Allergies  Allergen Reactions  . Heparin     Patient is Muslim and is not permitted Pork derative due to religious belief)    Prior to Admission medications   Medication Sig Start Date End Date Taking? Authorizing Provider  acetaminophen (TYLENOL) 650 MG CR tablet Take  650 mg by mouth every 8 (eight) hours as needed for pain.   Yes [provider]  amLODipine (NORVASC) 10 MG tablet Take 1 tablet (10 mg total) daily by mouth. 06/20/17 11/06/24 Yes Gildardo Pounds, NP  calcium acetate (PHOSLO) 667 MG capsule Take 2 capsules (1,334 mg total) by mouth 3 (three) times daily with meals. 12/26/17  Yes Donne Hazel, MD  carvedilol (COREG) 12.5 MG tablet Take 12.5 mg by mouth 2 (two) times daily with a meal.   Yes [provider]  ferrous gluconate (IRON 27) 240 (27 FE) MG tablet Take 240 mg by mouth daily.    Yes [provider]  furosemide (LASIX) 40 MG tablet Take 1 tablet (40 mg total) by mouth 2 (two) times daily. Patient taking differently: Take 40-80 mg by mouth See admin instructions. Take 2 tablets by mouth every morning and take 1 tablet every evening 11/18/17 11/18/18 Yes Regalado, Belkys A, MD  insulin NPH-regular Human (NOVOLIN 70/30) (70-30) 100 UNIT/ML injection Inject 18 Units into the skin 2 (two) times daily with a meal. 11/18/17  Yes Regalado, Belkys A, MD  Multiple Vitamins-Minerals (MULTIVITAMIN WITH MINERALS) tablet Take 1 tablet by mouth daily.   Yes [provider]  metroNIDAZOLE (FLAGYL) 500 MG tablet Take 1 tablet (500 mg total) by mouth every 12 (twelve) hours. Patient not  taking: Reported on 01/16/2018 12/26/17   Donne Hazel, MD  predniSONE (DELTASONE) 10 MG tablet Take 1 tablet (10 mg total) by mouth daily with breakfast. Patient not taking: Reported on 01/16/2018 12/27/17   Donne Hazel, MD    Social History   Socioeconomic History  . Marital status: Married    Spouse name: Not on file  . Number of children: Not on file  . Years of education: Not on file  . Highest education level: Not on file  Occupational History  . Not on file  Social Needs  . Financial resource strain: Not on file  . Food insecurity:    Worry: Not on file    Inability: Not on file  . Transportation needs:    Medical: Not  on file    Non-medical: Not on file  Tobacco Use  . Smoking status: Never Smoker  . Smokeless tobacco: Never Used  Substance and Sexual Activity  . Alcohol use: Never    Frequency: Never  . Drug use: Never  . Sexual activity: Not on file  Lifestyle  . Physical activity:    Days per week: Not on file    Minutes per session: Not on file  . Stress: Not on file  Relationships  . Social connections:    Talks on phone: Not on file    Gets together: Not on file    Attends religious service: Not on file    Active member of club or organization: Not on file    Attends meetings of clubs or organizations: Not on file    Relationship status: Not on file  . Intimate partner violence:    Fear of current or ex partner: Not on file    Emotionally abused: Not on file    Physically abused: Not on file    Forced sexual activity: Not on file  Other Topics Concern  . Not on file  Social History Narrative  . Not on file    Family History  Problem Relation Age of Onset  . Diabetes Mellitus II Sister   . Stroke Brother     ROS: [x]  Positive   [ ]  Negative   [ ]  All sytems reviewed and are negative  Cardiovascular: []  chest pain/pressure []  palpitations [x]  SOB lying flat []  DOE []  pain in legs while walking []  pain in legs at rest []  pain in legs at night []  non-healing ulcers []  hx of DVT []  swelling in legs  Pulmonary: []  productive cough []  asthma/wheezing []  home O2  Neurologic: []  weakness in []  arms []  legs []  numbness in []  arms []  legs []  hx of CVA []  mini stroke [] difficulty speaking or slurred speech []  temporary loss of vision in one eye []  dizziness  Hematologic: []  hx of cancer []  bleeding problems []  problems with blood clotting easily  Endocrine:  [x]  anemia [x]  pancytopenia [x]  diabetes  []  thyroid disease  GI []  vomiting blood []  blood in stool  GU: [x]  CKD/renal failure []  HD--[]  M/W/F or []  T/T/S []  burning with urination []  blood in  urine  Psychiatric: []  anxiety []  depression  Musculoskeletal: []  arthritis []  joint pain  Integumentary: []  rashes []  ulcers  Constitutional: []  fever []  chills   Physical Examination  Vitals:   01/18/18 0820 01/18/18 1044  BP: (!) 146/68 128/61  Pulse: 85   Resp:    Temp:    SpO2:     Body mass index is 24.48 kg/m.  General: No acute  distress Pulmonary: normal non-labored breathing Cardiac: Palpable radial pulses Abdomen: soft, NT/ND, no masses Extremities: Palpable thrill left upper arm Neurologic: A&O X 3 Psychiatric: Appropriate speech and affect  CBC    Component Value Date/Time   WBC 2.6 (L) 01/18/2018 0354   RBC 3.37 (L) 01/18/2018 0354   HGB 8.4 (L) 01/18/2018 0354   HGB 9.2 (L) 01/07/2018 1049   HGB 9.2 (L) 06/20/2017 1613   HCT 27.2 (L) 01/18/2018 0354   HCT 29.7 (L) 06/20/2017 1613   PLT 72 (L) 01/18/2018 0354   PLT 56 (L) 01/07/2018 1049   PLT 101 (L) 06/20/2017 1613   MCV 80.7 01/18/2018 0354   MCV 76 (L) 06/20/2017 1613   MCH 24.9 (L) 01/18/2018 0354   MCHC 30.9 01/18/2018 0354   RDW 17.8 (H) 01/18/2018 0354   RDW 16.2 (H) 06/20/2017 1613   LYMPHSABS 0.7 (L) 01/07/2018 1049   MONOABS 0.4 01/07/2018 1049   EOSABS 0.1 01/07/2018 1049   BASOSABS 0.0 01/07/2018 1049    BMET    Component Value Date/Time   NA 136 01/18/2018 0354   NA 137 06/20/2017 1613   K 4.4 01/18/2018 0354   CL 110 01/18/2018 0354   CO2 17 (L) 01/18/2018 0354   GLUCOSE 82 01/18/2018 0354   BUN 96 (H) 01/18/2018 0354   BUN 62 (H) 06/20/2017 1613   CREATININE 4.81 (H) 01/18/2018 0354   CREATININE 3.55 (HH) 01/07/2018 1049   CREATININE 1.23 08/31/2015 0859   CALCIUM 7.0 (L) 01/18/2018 0354   GFRNONAA 11 (L) 01/18/2018 0354   GFRNONAA 17 (L) 01/07/2018 1049   GFRNONAA 62 08/31/2015 0859   GFRAA 13 (L) 01/18/2018 0354   GFRAA 19 (L) 01/07/2018 1049   GFRAA 71 08/31/2015 0859    COAGS: Lab Results  Component Value Date   INR 1.28 01/17/2018   INR 1.27  12/26/2017   INR 1.18 11/25/2017       ASSESSMENT/PLAN: This is a 67 y.o. male now progressed to end-stage renal disease in need of access for dialysis.  We will proceed with tunneled dialysis catheter tomorrow in the operating room.  Patient is to be n.p.o. past midnight.  I discussed with patient in the presence of his family they demonstrate good understanding.  Pailynn Vahey C. Donzetta Matters, MD [facility] Office: [phone] Pager: [phone]

## 2018-01-18 NOTE — Progress Notes (Signed)
PROGRESS NOTE    William [name]  MCE:[id number] DOB: 1951-04-30 DOA: 01/16/2018 PCP: Gildardo Pounds, NP  Brief Narrative: 67 y.o. male with medical history of CKD4 (AVF 5/24), DM, goodpasture syndrome, pancytopenia, hypertension, upper GI bleeding, cirrhosis with ascites, diastolic CHF who presented with shortness of breath. 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  -On IV  Lasix as above  Type II diabetes mellitus with renal manifestations ([place]) -: Last A1c 8.1 on 06/20/17, poorly controled. -  With hypoglycemia again, stop insulin 70/30 and monitor  Pancytopenia ([place]):  -likely secondary to cirrhosis/splenic sequestration, unclear of Goodpasture's syndrome can contribute aynd CKD5 -Stable,  anemia panel with good iron stores  HTN:  -Continue home medications: Amlodipine, Coreg, -will need to titrate down meds after HD started  Cirrhosis of liver with  ascites ([place]):  -etiology of this is unclear -Mental status stable, Ammonia level is elevated at 51 -continue lactulose -exam with moderate ascites, may need paracentesis this admission  DVT ppx: SCDs due to low plts Code Status: Full code Family Communication: Yes, patient's son  at bed side Disposition Plan: home after volume removal with hemodialysis and clip   Consults: Renal   Procedures:   Antimicrobials:    Subjective: -no significant response to diuresis, -Continues to have a poor appetite  Objective: Vitals:   01/17/18 1955 01/18/18 0402 01/18/18 0820 01/18/18 1044  BP: 136/73 135/60 (!) 146/68 128/61  Pulse: 77 82 85   Resp: (!) 22 (!) 22    Temp: 98.6 F (37 C) 98.4 F (36.9 C)    TempSrc: Oral Oral    SpO2: 98% 98%    Weight:  66.7 kg (147 lb 1.6 oz)    Height:        Intake/Output Summary (Last 24 hours) at 01/18/2018 1233 Last data filed at 01/18/2018 1047 Gross per 24 hour  Intake 420 ml  Output 125 ml  Net 295 ml   Filed Weights   01/17/18 0007 01/18/18 0402  Weight: 67.9 kg (149 lb 9.6 oz) 66.7 kg (147 lb 1.6 oz)    Examination: Gen: Awake, Alert, Oriented X 3, laying in bed, no distress HEENT: PERRLA, Neck supple, + JVD Lungs: bilateral crackles up to two third of both lungs CVS: S1S 2/regular rate rhythm, tachycardic Abd: soft, Non tender, non distended, BS present Extremities: 2+edema Skin: no new rashes Psychiatry: Mood & affect appropriate.     Data Reviewed:   CBC: Recent Labs  Lab 01/14/18 1314 01/16/18 1645 01/16/18 1731 01/17/18 0142  01/18/18 0354  WBC  --   --  3.0* 3.0* 2.6*  HGB 9.5* 9.5* 9.1* 8.4* 8.4*  HCT  --  28.0* 29.3* 26.7* 27.2*  MCV  --   --  80.7 79.9 80.7  PLT  --   --  72* 83* 72*   Basic Metabolic Panel: Recent Labs  Lab 01/16/18 1645 01/16/18 1731 01/17/18 0142 01/17/18 1109 01/17/18 1418 01/18/18 0354  NA 136 135 134*  --  135 136  K 4.5 4.5 4.1  --  4.3 4.4  CL 106 109 109  --  110  110  CO2  --  14* 15*  --  15* 17*  GLUCOSE 145* 140* 180*  --  215* 82  BUN 96* 95* 96*  --  98* 96*  CREATININE 5.20* 4.90* 4.72*  --  4.91* 4.81*  CALCIUM  --  7.1* 6.9*  --  7.0* 7.0*  MG  --   --   --  2.6*  --   --   PHOS  --   --   --   --  6.8*  --    GFR: Estimated Creatinine Clearance: 13.1 mL/min (A) (by C-G formula based on SCr of 4.81 mg/dL (H)). Liver Function Tests: Recent Labs  Lab 01/17/18 1418 01/18/18 0354  AST  --  31  ALT  --  22  ALKPHOS  --  93  BILITOT  --  0.9  PROT  --  5.6*  ALBUMIN 3.0* 2.8*   No results for input(s): LIPASE, AMYLASE in the last 168 hours. Recent Labs  Lab 01/17/18 0452  AMMONIA 51*   Coagulation Profile: Recent Labs  Lab 01/17/18 0142  INR 1.28   Cardiac Enzymes: No results for input(s): CKTOTAL, CKMB, CKMBINDEX, TROPONINI in the last 168 hours. BNP (last 3 results) Recent Labs    09/25/17 1614  PROBNP 380.0*   HbA1C: No results for input(s): HGBA1C in the last 72 hours. CBG: Recent Labs  Lab 01/17/18 1635 01/17/18 1716 01/17/18 2049 01/18/18 0720 01/18/18 1125  GLUCAP 53* 78 102* 138* 169*   Lipid Profile: No results for input(s): CHOL, HDL, LDLCALC, TRIG, CHOLHDL, LDLDIRECT in the last 72 hours. Thyroid Function Tests: No results for input(s): TSH, T4TOTAL, FREET4, T3FREE, THYROIDAB in the last 72 hours. Anemia Panel: Recent Labs    01/17/18 1113  VITAMINB12 1,195*  FOLATE 34.5  FERRITIN 563*  TIBC 238*  IRON 60  RETICCTPCT 1.1   Urine analysis:    Component Value Date/Time   COLORURINE YELLOW 12/19/2017 2230   APPEARANCEUR CLEAR 12/19/2017 2230   LABSPEC 1.011 12/19/2017 2230   PHURINE 7.0 12/19/2017 2230   GLUCOSEU NEGATIVE 12/19/2017 2230   HGBUR SMALL (A) 12/19/2017 2230   BILIRUBINUR NEGATIVE 12/19/2017 2230   BILIRUBINUR neg 09/14/2015 1441   KETONESUR NEGATIVE 12/19/2017 2230   PROTEINUR 100 (A) 12/19/2017 2230   UROBILINOGEN 0.2 09/14/2015 1441   UROBILINOGEN 0.2 01/27/2013 2052    NITRITE NEGATIVE 12/19/2017 2230   LEUKOCYTESUR NEGATIVE 12/19/2017 2230   Sepsis Labs: @LABRCNTIP (procalcitonin:4,lacticidven:4)  )No results found for this or any previous visit (from the past 240 hour(s)).       Radiology Studies: Dg Chest 2 View  Result Date: 01/16/2018 CLINICAL DATA:  Cough for 3 days, fever, shortness of breath EXAM: CHEST - 2 VIEW COMPARISON:  12/22/2017 FINDINGS: Enlargement of cardiac silhouette with pulmonary vascular congestion. Interstitial infiltrates consistent with pulmonary edema, slightly improved from previous exam. No pleural effusion or pneumothorax. Bones unremarkable.  Incidentally noted atherosclerotic calcification at the aortic arch. IMPRESSION: Improved pulmonary edema versus 12/22/2017. Electronically Signed   By: Lavonia Dana M.D.   On: 01/16/2018 17:03        Scheduled Meds: . amLODipine  10 mg Oral Daily  . calcium acetate  2,001 mg Oral TID WC  . carvedilol  12.5 mg Oral BID WC  . Chlorhexidine Gluconate Cloth  6 each Topical Q0600  . [START ON 01/19/2018] darbepoetin (ARANESP) injection - DIALYSIS  100 mcg Intravenous Q Mon-HD  . feeding supplement (PRO-STAT SUGAR FREE 64)  30 mL Oral BID  . ferrous gluconate  324 mg Oral Daily  . insulin aspart  0-9 Units Subcutaneous TID WC  . insulin aspart protamine- aspart  5 Units Subcutaneous BID WC  . lactulose  20 g Oral BID  . multivitamin  1 tablet Oral QHS   Continuous Infusions: . furosemide 160 mg (01/18/18 1047)     LOS: 2 days    Time spent: 18mn    PDomenic Polite MD [facility] Page via [email or web address], password [email or web address] After 7PM please contact night-coverage  01/18/2018, 12:33 PM

## 2018-01-18 NOTE — H&P (View-Only) (Signed)
Hospital Consult    Reason for Consult:  Need for tdc Referring Physician:  Dr. Joelyn Oms MRN #:  [id number]  History of Present Illness: This is a 67 y.o. male with Goodpasture disease, hypertension and chronic kidney disease.  He has now progressed to need dialysis.  Fistula was placed a few weeks ago will need super fistulization prior to use he is still over 2 months out from having a suitable fistula.  He is now in need of access for dialysis with uremia.  Previously had thrombocytopenia.  Past Medical History:  Diagnosis Date  . Anemia   . CHF (congestive heart failure) ([place])   . Cirrhosis ([place])   . Cirrhosis ([place])   . CKD (chronic kidney disease), stage IV ([place])   . Goodpasture's disease [facility])    on outpatient plasmapheresis/notes 11/20/2017  . Grade I diastolic dysfunction 34/19/6222  . History of anemia due to chronic kidney disease   . History of blood transfusion X 1   "UGIB; low blood count"  . History of plasmapheresis    "qod" (11/20/2017)  . Hypertension   . Pancytopenia ([place]) 08/20/2017  . Type II diabetes mellitus ([place])     Past Surgical History:  Procedure Laterality Date  . AV FISTULA PLACEMENT Left 12/26/2017   Procedure: LEFT BRACHIOCEPHALIC ARTERIOVENOUS (AV) FISTULA CREATION;  Surgeon: Conrad [place], MD;  Location: [facility];  Service: Vascular;  Laterality: Left;  . CATARACT EXTRACTION W/ INTRAOCULAR LENS  IMPLANT, BILATERAL Bilateral   . IR FLUORO GUIDE CV LINE RIGHT  11/10/2017  . IR PARACENTESIS  12/10/2017  . IR PARACENTESIS  12/26/2017  . IR REMOVAL TUN CV CATH W/O FL  11/28/2017  . IR US GUIDE VASC ACCESS RIGHT  11/10/2017  . NECK SURGERY  2007   "back of neck; no hardware in there"    Allergies  Allergen Reactions  . Heparin     Patient is Muslim and is not permitted Pork derative due to religious belief)    Prior to Admission medications   Medication Sig Start Date End Date Taking? Authorizing Provider  acetaminophen (TYLENOL) 650 MG CR tablet Take  650 mg by mouth every 8 (eight) hours as needed for pain.   Yes [provider]  amLODipine (NORVASC) 10 MG tablet Take 1 tablet (10 mg total) daily by mouth. 06/20/17 11/06/24 Yes Gildardo Pounds, NP  calcium acetate (PHOSLO) 667 MG capsule Take 2 capsules (1,334 mg total) by mouth 3 (three) times daily with meals. 12/26/17  Yes Donne Hazel, MD  carvedilol (COREG) 12.5 MG tablet Take 12.5 mg by mouth 2 (two) times daily with a meal.   Yes [provider]  ferrous gluconate (IRON 27) 240 (27 FE) MG tablet Take 240 mg by mouth daily.    Yes [provider]  furosemide (LASIX) 40 MG tablet Take 1 tablet (40 mg total) by mouth 2 (two) times daily. Patient taking differently: Take 40-80 mg by mouth See admin instructions. Take 2 tablets by mouth every morning and take 1 tablet every evening 11/18/17 11/18/18 Yes Regalado, Belkys A, MD  insulin NPH-regular Human (NOVOLIN 70/30) (70-30) 100 UNIT/ML injection Inject 18 Units into the skin 2 (two) times daily with a meal. 11/18/17  Yes Regalado, Belkys A, MD  Multiple Vitamins-Minerals (MULTIVITAMIN WITH MINERALS) tablet Take 1 tablet by mouth daily.   Yes [provider]  metroNIDAZOLE (FLAGYL) 500 MG tablet Take 1 tablet (500 mg total) by mouth every 12 (twelve) hours. Patient not  taking: Reported on 01/16/2018 12/26/17   Donne Hazel, MD  predniSONE (DELTASONE) 10 MG tablet Take 1 tablet (10 mg total) by mouth daily with breakfast. Patient not taking: Reported on 01/16/2018 12/27/17   Donne Hazel, MD    Social History   Socioeconomic History  . Marital status: Married    Spouse name: Not on file  . Number of children: Not on file  . Years of education: Not on file  . Highest education level: Not on file  Occupational History  . Not on file  Social Needs  . Financial resource strain: Not on file  . Food insecurity:    Worry: Not on file    Inability: Not on file  . Transportation needs:    Medical: Not  on file    Non-medical: Not on file  Tobacco Use  . Smoking status: Never Smoker  . Smokeless tobacco: Never Used  Substance and Sexual Activity  . Alcohol use: Never    Frequency: Never  . Drug use: Never  . Sexual activity: Not on file  Lifestyle  . Physical activity:    Days per week: Not on file    Minutes per session: Not on file  . Stress: Not on file  Relationships  . Social connections:    Talks on phone: Not on file    Gets together: Not on file    Attends religious service: Not on file    Active member of club or organization: Not on file    Attends meetings of clubs or organizations: Not on file    Relationship status: Not on file  . Intimate partner violence:    Fear of current or ex partner: Not on file    Emotionally abused: Not on file    Physically abused: Not on file    Forced sexual activity: Not on file  Other Topics Concern  . Not on file  Social History Narrative  . Not on file    Family History  Problem Relation Age of Onset  . Diabetes Mellitus II Sister   . Stroke Brother     ROS: [x]  Positive   [ ]  Negative   [ ]  All sytems reviewed and are negative  Cardiovascular: []  chest pain/pressure []  palpitations [x]  SOB lying flat []  DOE []  pain in legs while walking []  pain in legs at rest []  pain in legs at night []  non-healing ulcers []  hx of DVT []  swelling in legs  Pulmonary: []  productive cough []  asthma/wheezing []  home O2  Neurologic: []  weakness in []  arms []  legs []  numbness in []  arms []  legs []  hx of CVA []  mini stroke [] difficulty speaking or slurred speech []  temporary loss of vision in one eye []  dizziness  Hematologic: []  hx of cancer []  bleeding problems []  problems with blood clotting easily  Endocrine:  [x]  anemia [x]  pancytopenia [x]  diabetes  []  thyroid disease  GI []  vomiting blood []  blood in stool  GU: [x]  CKD/renal failure []  HD--[]  M/W/F or []  T/T/S []  burning with urination []  blood in  urine  Psychiatric: []  anxiety []  depression  Musculoskeletal: []  arthritis []  joint pain  Integumentary: []  rashes []  ulcers  Constitutional: []  fever []  chills   Physical Examination  Vitals:   01/18/18 0820 01/18/18 1044  BP: (!) 146/68 128/61  Pulse: 85   Resp:    Temp:    SpO2:     Body mass index is 24.48 kg/m.  General: No acute  distress Pulmonary: normal non-labored breathing Cardiac: Palpable radial pulses Abdomen: soft, NT/ND, no masses Extremities: Palpable thrill left upper arm Neurologic: A&O X 3 Psychiatric: Appropriate speech and affect  CBC    Component Value Date/Time   WBC 2.6 (L) 01/18/2018 0354   RBC 3.37 (L) 01/18/2018 0354   HGB 8.4 (L) 01/18/2018 0354   HGB 9.2 (L) 01/07/2018 1049   HGB 9.2 (L) 06/20/2017 1613   HCT 27.2 (L) 01/18/2018 0354   HCT 29.7 (L) 06/20/2017 1613   PLT 72 (L) 01/18/2018 0354   PLT 56 (L) 01/07/2018 1049   PLT 101 (L) 06/20/2017 1613   MCV 80.7 01/18/2018 0354   MCV 76 (L) 06/20/2017 1613   MCH 24.9 (L) 01/18/2018 0354   MCHC 30.9 01/18/2018 0354   RDW 17.8 (H) 01/18/2018 0354   RDW 16.2 (H) 06/20/2017 1613   LYMPHSABS 0.7 (L) 01/07/2018 1049   MONOABS 0.4 01/07/2018 1049   EOSABS 0.1 01/07/2018 1049   BASOSABS 0.0 01/07/2018 1049    BMET    Component Value Date/Time   NA 136 01/18/2018 0354   NA 137 06/20/2017 1613   K 4.4 01/18/2018 0354   CL 110 01/18/2018 0354   CO2 17 (L) 01/18/2018 0354   GLUCOSE 82 01/18/2018 0354   BUN 96 (H) 01/18/2018 0354   BUN 62 (H) 06/20/2017 1613   CREATININE 4.81 (H) 01/18/2018 0354   CREATININE 3.55 (HH) 01/07/2018 1049   CREATININE 1.23 08/31/2015 0859   CALCIUM 7.0 (L) 01/18/2018 0354   GFRNONAA 11 (L) 01/18/2018 0354   GFRNONAA 17 (L) 01/07/2018 1049   GFRNONAA 62 08/31/2015 0859   GFRAA 13 (L) 01/18/2018 0354   GFRAA 19 (L) 01/07/2018 1049   GFRAA 71 08/31/2015 0859    COAGS: Lab Results  Component Value Date   INR 1.28 01/17/2018   INR 1.27  12/26/2017   INR 1.18 11/25/2017       ASSESSMENT/PLAN: This is a 67 y.o. male now progressed to end-stage renal disease in need of access for dialysis.  We will proceed with tunneled dialysis catheter tomorrow in the operating room.  Patient is to be n.p.o. past midnight.  I discussed with patient in the presence of his family they demonstrate good understanding.  Jeniel Slauson C. Donzetta Matters, MD [facility] [place] Office: [phone] Pager: [phone]

## 2018-01-18 NOTE — Progress Notes (Signed)
Consent form for tunneled dialysis catheter placement signed using translation assistance from [place].  Evening assessment also performed using STRAT[employer] assistance.  Pt denies pain or SOB, no signs of distress or discomfort noted.  Family present in room at this time.

## 2018-01-18 NOTE — Consult Note (Signed)
Hospital Consult    Reason for Consult:  Need for tdc Referring Physician:  Dr. Joelyn Oms MRN #:  [id number]  History of Present Illness: This is a 67 y.o. male with Goodpasture disease, hypertension and chronic kidney disease.  He has now progressed to need dialysis.  Fistula was placed a few weeks ago will need super fistulization prior to use he is still over 2 months out from having a suitable fistula.  He is now in need of access for dialysis with uremia.  Previously had thrombocytopenia.  Past Medical History:  Diagnosis Date  . Anemia   . CHF (congestive heart failure) ([place])   . Cirrhosis ([place])   . Cirrhosis ([place])   . CKD (chronic kidney disease), stage IV ([place])   . Goodpasture's disease [facility])    on outpatient plasmapheresis/notes 11/20/2017  . Grade I diastolic dysfunction 77/82/4235  . History of anemia due to chronic kidney disease   . History of blood transfusion X 1   "UGIB; low blood count"  . History of plasmapheresis    "qod" (11/20/2017)  . Hypertension   . Pancytopenia ([place]) 08/20/2017  . Type II diabetes mellitus ([place])     Past Surgical History:  Procedure Laterality Date  . AV FISTULA PLACEMENT Left 12/26/2017   Procedure: LEFT BRACHIOCEPHALIC ARTERIOVENOUS (AV) FISTULA CREATION;  Surgeon: Conrad [place], MD;  Location: [place];  Service: Vascular;  Laterality: Left;  . CATARACT EXTRACTION W/ INTRAOCULAR LENS  IMPLANT, BILATERAL Bilateral   . IR FLUORO GUIDE CV LINE RIGHT  11/10/2017  . IR PARACENTESIS  12/10/2017  . IR PARACENTESIS  12/26/2017  . IR REMOVAL TUN CV CATH W/O FL  11/28/2017  . IR US GUIDE VASC ACCESS RIGHT  11/10/2017  . NECK SURGERY  2007   "back of neck; no hardware in there"    Allergies  Allergen Reactions  . Heparin     Patient is Muslim and is not permitted Pork derative due to religious belief)    Prior to Admission medications   Medication Sig Start Date End Date Taking? Authorizing Provider  acetaminophen (TYLENOL) 650 MG CR tablet Take  650 mg by mouth every 8 (eight) hours as needed for pain.   Yes [provider]  amLODipine (NORVASC) 10 MG tablet Take 1 tablet (10 mg total) daily by mouth. 06/20/17 11/06/24 Yes Gildardo Pounds, NP  calcium acetate (PHOSLO) 667 MG capsule Take 2 capsules (1,334 mg total) by mouth 3 (three) times daily with meals. 12/26/17  Yes Donne Hazel, MD  carvedilol (COREG) 12.5 MG tablet Take 12.5 mg by mouth 2 (two) times daily with a meal.   Yes [provider]  ferrous gluconate (IRON 27) 240 (27 FE) MG tablet Take 240 mg by mouth daily.    Yes [provider]  furosemide (LASIX) 40 MG tablet Take 1 tablet (40 mg total) by mouth 2 (two) times daily. Patient taking differently: Take 40-80 mg by mouth See admin instructions. Take 2 tablets by mouth every morning and take 1 tablet every evening 11/18/17 11/18/18 Yes Regalado, Belkys A, MD  insulin NPH-regular Human (NOVOLIN 70/30) (70-30) 100 UNIT/ML injection Inject 18 Units into the skin 2 (two) times daily with a meal. 11/18/17  Yes Regalado, Belkys A, MD  Multiple Vitamins-Minerals (MULTIVITAMIN WITH MINERALS) tablet Take 1 tablet by mouth daily.   Yes [provider]  metroNIDAZOLE (FLAGYL) 500 MG tablet Take 1 tablet (500 mg total) by mouth every 12 (twelve) hours. Patient not  taking: Reported on 01/16/2018 12/26/17   Donne Hazel, MD  predniSONE (DELTASONE) 10 MG tablet Take 1 tablet (10 mg total) by mouth daily with breakfast. Patient not taking: Reported on 01/16/2018 12/27/17   Donne Hazel, MD    Social History   Socioeconomic History  . Marital status: Married    Spouse name: Not on file  . Number of children: Not on file  . Years of education: Not on file  . Highest education level: Not on file  Occupational History  . Not on file  Social Needs  . Financial resource strain: Not on file  . Food insecurity:    Worry: Not on file    Inability: Not on file  . Transportation needs:    Medical: Not  on file    Non-medical: Not on file  Tobacco Use  . Smoking status: Never Smoker  . Smokeless tobacco: Never Used  Substance and Sexual Activity  . Alcohol use: Never    Frequency: Never  . Drug use: Never  . Sexual activity: Not on file  Lifestyle  . Physical activity:    Days per week: Not on file    Minutes per session: Not on file  . Stress: Not on file  Relationships  . Social connections:    Talks on phone: Not on file    Gets together: Not on file    Attends religious service: Not on file    Active member of club or organization: Not on file    Attends meetings of clubs or organizations: Not on file    Relationship status: Not on file  . Intimate partner violence:    Fear of current or ex partner: Not on file    Emotionally abused: Not on file    Physically abused: Not on file    Forced sexual activity: Not on file  Other Topics Concern  . Not on file  Social History Narrative  . Not on file    Family History  Problem Relation Age of Onset  . Diabetes Mellitus II Sister   . Stroke Brother     ROS: [x]  Positive   [ ]  Negative   [ ]  All sytems reviewed and are negative  Cardiovascular: []  chest pain/pressure []  palpitations [x]  SOB lying flat []  DOE []  pain in legs while walking []  pain in legs at rest []  pain in legs at night []  non-healing ulcers []  hx of DVT []  swelling in legs  Pulmonary: []  productive cough []  asthma/wheezing []  home O2  Neurologic: []  weakness in []  arms []  legs []  numbness in []  arms []  legs []  hx of CVA []  mini stroke [] difficulty speaking or slurred speech []  temporary loss of vision in one eye []  dizziness  Hematologic: []  hx of cancer []  bleeding problems []  problems with blood clotting easily  Endocrine:  [x]  anemia [x]  pancytopenia [x]  diabetes  []  thyroid disease  GI []  vomiting blood []  blood in stool  GU: [x]  CKD/renal failure []  HD--[]  M/W/F or []  T/T/S []  burning with urination []  blood in  urine  Psychiatric: []  anxiety []  depression  Musculoskeletal: []  arthritis []  joint pain  Integumentary: []  rashes []  ulcers  Constitutional: []  fever []  chills   Physical Examination  Vitals:   01/18/18 0820 01/18/18 1044  BP: (!) 146/68 128/61  Pulse: 85   Resp:    Temp:    SpO2:     Body mass index is 24.48 kg/m.  General: No acute  distress Pulmonary: normal non-labored breathing Cardiac: Palpable radial pulses Abdomen: soft, NT/ND, no masses Extremities: Palpable thrill left upper arm Neurologic: A&O X 3 Psychiatric: Appropriate speech and affect  CBC    Component Value Date/Time   WBC 2.6 (L) 01/18/2018 0354   RBC 3.37 (L) 01/18/2018 0354   HGB 8.4 (L) 01/18/2018 0354   HGB 9.2 (L) 01/07/2018 1049   HGB 9.2 (L) 06/20/2017 1613   HCT 27.2 (L) 01/18/2018 0354   HCT 29.7 (L) 06/20/2017 1613   PLT 72 (L) 01/18/2018 0354   PLT 56 (L) 01/07/2018 1049   PLT 101 (L) 06/20/2017 1613   MCV 80.7 01/18/2018 0354   MCV 76 (L) 06/20/2017 1613   MCH 24.9 (L) 01/18/2018 0354   MCHC 30.9 01/18/2018 0354   RDW 17.8 (H) 01/18/2018 0354   RDW 16.2 (H) 06/20/2017 1613   LYMPHSABS 0.7 (L) 01/07/2018 1049   MONOABS 0.4 01/07/2018 1049   EOSABS 0.1 01/07/2018 1049   BASOSABS 0.0 01/07/2018 1049    BMET    Component Value Date/Time   NA 136 01/18/2018 0354   NA 137 06/20/2017 1613   K 4.4 01/18/2018 0354   CL 110 01/18/2018 0354   CO2 17 (L) 01/18/2018 0354   GLUCOSE 82 01/18/2018 0354   BUN 96 (H) 01/18/2018 0354   BUN 62 (H) 06/20/2017 1613   CREATININE 4.81 (H) 01/18/2018 0354   CREATININE 3.55 (HH) 01/07/2018 1049   CREATININE 1.23 08/31/2015 0859   CALCIUM 7.0 (L) 01/18/2018 0354   GFRNONAA 11 (L) 01/18/2018 0354   GFRNONAA 17 (L) 01/07/2018 1049   GFRNONAA 62 08/31/2015 0859   GFRAA 13 (L) 01/18/2018 0354   GFRAA 19 (L) 01/07/2018 1049   GFRAA 71 08/31/2015 0859    COAGS: Lab Results  Component Value Date   INR 1.28 01/17/2018   INR 1.27  12/26/2017   INR 1.18 11/25/2017       ASSESSMENT/PLAN: This is a 67 y.o. male now progressed to end-stage renal disease in need of access for dialysis.  We will proceed with tunneled dialysis catheter tomorrow in the operating room.  Patient is to be n.p.o. past midnight.  I discussed with patient in the presence of his family they demonstrate good understanding.  Eyan Hagood C. Donzetta Matters, MD [facility] [place] Office: [phone] Pager: [phone]

## 2018-01-19 ENCOUNTER — Encounter (HOSPITAL_COMMUNITY): Payer: Self-pay | Admitting: Orthopedic Surgery

## 2018-01-19 ENCOUNTER — Inpatient Hospital Stay (HOSPITAL_COMMUNITY): Payer: Medicare Other | Admitting: Certified Registered"

## 2018-01-19 ENCOUNTER — Inpatient Hospital Stay (HOSPITAL_COMMUNITY): Payer: Medicare Other

## 2018-01-19 ENCOUNTER — Encounter (HOSPITAL_COMMUNITY)
Admission: EM | Disposition: A | Discharge status: Home / Self Care | Payer: Self-pay | Source: Home / Self Care | Attending: Internal Medicine

## 2018-01-19 DIAGNOSIS — D631 Anemia in chronic kidney disease: Secondary | ICD-10-CM | POA: Diagnosis not present

## 2018-01-19 DIAGNOSIS — K766 Portal hypertension: Secondary | ICD-10-CM | POA: Diagnosis not present

## 2018-01-19 DIAGNOSIS — I10 Essential (primary) hypertension: Secondary | ICD-10-CM | POA: Diagnosis not present

## 2018-01-19 DIAGNOSIS — N2883 Nephroptosis: Secondary | ICD-10-CM | POA: Diagnosis not present

## 2018-01-19 DIAGNOSIS — R188 Other ascites: Secondary | ICD-10-CM | POA: Diagnosis not present

## 2018-01-19 DIAGNOSIS — I5033 Acute on chronic diastolic (congestive) heart failure: Secondary | ICD-10-CM | POA: Diagnosis not present

## 2018-01-19 DIAGNOSIS — Z794 Long term (current) use of insulin: Secondary | ICD-10-CM | POA: Diagnosis not present

## 2018-01-19 DIAGNOSIS — E1122 Type 2 diabetes mellitus with diabetic chronic kidney disease: Secondary | ICD-10-CM | POA: Diagnosis not present

## 2018-01-19 DIAGNOSIS — N2581 Secondary hyperparathyroidism of renal origin: Secondary | ICD-10-CM | POA: Diagnosis not present

## 2018-01-19 DIAGNOSIS — Z8701 Personal history of pneumonia (recurrent): Secondary | ICD-10-CM | POA: Diagnosis not present

## 2018-01-19 DIAGNOSIS — Z7952 Long term (current) use of systemic steroids: Secondary | ICD-10-CM | POA: Diagnosis not present

## 2018-01-19 DIAGNOSIS — N186 End stage renal disease: Secondary | ICD-10-CM | POA: Diagnosis not present

## 2018-01-19 DIAGNOSIS — I130 Hypertensive heart and chronic kidney disease with heart failure and stage 1 through stage 4 chronic kidney disease, or unspecified chronic kidney disease: Secondary | ICD-10-CM | POA: Diagnosis not present

## 2018-01-19 DIAGNOSIS — N184 Chronic kidney disease, stage 4 (severe): Secondary | ICD-10-CM | POA: Diagnosis not present

## 2018-01-19 DIAGNOSIS — I132 Hypertensive heart and chronic kidney disease with heart failure and with stage 5 chronic kidney disease, or end stage renal disease: Secondary | ICD-10-CM | POA: Diagnosis not present

## 2018-01-19 DIAGNOSIS — R0602 Shortness of breath: Secondary | ICD-10-CM | POA: Diagnosis not present

## 2018-01-19 DIAGNOSIS — M310 Hypersensitivity angiitis: Secondary | ICD-10-CM | POA: Diagnosis not present

## 2018-01-19 DIAGNOSIS — D61818 Other pancytopenia: Secondary | ICD-10-CM | POA: Diagnosis not present

## 2018-01-19 DIAGNOSIS — Z833 Family history of diabetes mellitus: Secondary | ICD-10-CM | POA: Diagnosis not present

## 2018-01-19 DIAGNOSIS — N185 Chronic kidney disease, stage 5: Secondary | ICD-10-CM | POA: Diagnosis not present

## 2018-01-19 DIAGNOSIS — K7460 Unspecified cirrhosis of liver: Secondary | ICD-10-CM | POA: Diagnosis not present

## 2018-01-19 HISTORY — PX: INSERTION OF DIALYSIS CATHETER: SHX1324

## 2018-01-19 LAB — CBC
HCT: 27.8 % — ABNORMAL LOW (ref 39.0–52.0)
HEMOGLOBIN: 8.6 g/dL — AB (ref 13.0–17.0)
MCH: 25.1 pg — ABNORMAL LOW (ref 26.0–34.0)
MCHC: 30.9 g/dL (ref 30.0–36.0)
MCV: 81.3 fL (ref 78.0–100.0)
Platelets: 78 10*3/uL — ABNORMAL LOW (ref 150–400)
RBC: 3.42 MIL/uL — ABNORMAL LOW (ref 4.22–5.81)
RDW: 17.8 % — ABNORMAL HIGH (ref 11.5–15.5)
WBC: 2.8 10*3/uL — ABNORMAL LOW (ref 4.0–10.5)

## 2018-01-19 LAB — BASIC METABOLIC PANEL
Anion gap: 10 (ref 5–15)
BUN: 96 mg/dL — ABNORMAL HIGH (ref 6–20)
CALCIUM: 7.3 mg/dL — AB (ref 8.9–10.3)
CHLORIDE: 108 mmol/L (ref 101–111)
CO2: 16 mmol/L — ABNORMAL LOW (ref 22–32)
CREATININE: 4.67 mg/dL — AB (ref 0.61–1.24)
GFR calc Af Amer: 14 mL/min — ABNORMAL LOW (ref >60)
GFR calc non Af Amer: 12 mL/min — ABNORMAL LOW (ref >60)
Glucose, Bld: 226 mg/dL — ABNORMAL HIGH (ref 65–99)
Potassium: 4.4 mmol/L (ref 3.5–5.1)
SODIUM: 134 mmol/L — AB (ref 135–145)

## 2018-01-19 LAB — GLUCOSE, CAPILLARY
GLUCOSE-CAPILLARY: 142 mg/dL — AB (ref 65–99)
GLUCOSE-CAPILLARY: 408 mg/dL — AB (ref 65–99)
Glucose-Capillary: 207 mg/dL — ABNORMAL HIGH (ref 65–99)
Glucose-Capillary: 104 mg/dL — ABNORMAL HIGH (ref 65–99)
Glucose-Capillary: 183 mg/dL — ABNORMAL HIGH (ref 65–99)
Glucose-Capillary: 440 mg/dL — ABNORMAL HIGH (ref 65–99)

## 2018-01-19 LAB — PARATHYROID HORMONE, INTACT (NO CA): PTH: 138 pg/mL — ABNORMAL HIGH (ref 15–65)

## 2018-01-19 LAB — GLUCOSE, RANDOM: GLUCOSE: 452 mg/dL — AB (ref 65–99)

## 2018-01-19 SURGERY — INSERTION OF DIALYSIS CATHETER
Anesthesia: General | Site: Chest

## 2018-01-19 MED ORDER — FENTANYL CITRATE (PF) 100 MCG/2ML IJ SOLN
INTRAMUSCULAR | Status: DC | PRN
  Administered 2018-01-19: 25 ug via INTRAVENOUS

## 2018-01-19 MED ORDER — FENTANYL CITRATE (PF) 100 MCG/2ML IJ SOLN: 25.0000 ug | INTRAMUSCULAR | Status: DC | PRN

## 2018-01-19 MED ORDER — ONDANSETRON HCL 4 MG/2ML IJ SOLN
INTRAMUSCULAR | Status: DC | PRN
  Administered 2018-01-19: 4 mg via INTRAVENOUS

## 2018-01-19 MED ORDER — PHENYLEPHRINE 40 MCG/ML (10ML) SYRINGE FOR IV PUSH (FOR BLOOD PRESSURE SUPPORT)
PREFILLED_SYRINGE | INTRAVENOUS | Status: DC | PRN
  Administered 2018-01-19 (×4): 80 ug via INTRAVENOUS
  Administered 2018-01-19: 120 ug via INTRAVENOUS
  Administered 2018-01-19: 40 ug via INTRAVENOUS

## 2018-01-19 MED ORDER — CEFAZOLIN SODIUM 1 G IJ SOLR
INTRAMUSCULAR | Status: AC
  Filled 2018-01-19: qty 10

## 2018-01-19 MED ORDER — FENTANYL CITRATE (PF) 250 MCG/5ML IJ SOLN
INTRAMUSCULAR | Status: AC
  Filled 2018-01-19: qty 5

## 2018-01-19 MED ORDER — CEFAZOLIN SODIUM 1 G IJ SOLR
INTRAMUSCULAR | Status: AC
  Filled 2018-01-19: qty 20

## 2018-01-19 MED ORDER — 0.9 % SODIUM CHLORIDE (POUR BTL) OPTIME
TOPICAL | Status: DC | PRN
  Administered 2018-01-19: 1000 mL

## 2018-01-19 MED ORDER — PROPOFOL 10 MG/ML IV BOLUS
INTRAVENOUS | Status: DC | PRN
  Administered 2018-01-19: 140 mg via INTRAVENOUS

## 2018-01-19 MED ORDER — LIDOCAINE-EPINEPHRINE 0.5 %-1:200000 IJ SOLN
INTRAMUSCULAR | Status: AC
  Filled 2018-01-19: qty 1

## 2018-01-19 MED ORDER — PHENYLEPHRINE 40 MCG/ML (10ML) SYRINGE FOR IV PUSH (FOR BLOOD PRESSURE SUPPORT)
PREFILLED_SYRINGE | INTRAVENOUS | Status: AC
  Filled 2018-01-19: qty 10

## 2018-01-19 MED ORDER — DEXAMETHASONE SODIUM PHOSPHATE 10 MG/ML IJ SOLN
INTRAMUSCULAR | Status: DC | PRN
  Administered 2018-01-19: 5 mg via INTRAVENOUS

## 2018-01-19 MED ORDER — ONDANSETRON HCL 4 MG/2ML IJ SOLN
INTRAMUSCULAR | Status: AC
  Filled 2018-01-19: qty 2

## 2018-01-19 MED ORDER — SODIUM CHLORIDE 0.9 % IV SOLN
INTRAVENOUS | Status: DC
  Administered 2018-01-19: 12:00:00 via INTRAVENOUS

## 2018-01-19 MED ORDER — EPHEDRINE SULFATE 50 MG/ML IJ SOLN
INTRAMUSCULAR | Status: AC
  Filled 2018-01-19: qty 1

## 2018-01-19 MED ORDER — LIDOCAINE 2% (20 MG/ML) 5 ML SYRINGE
INTRAMUSCULAR | Status: AC
  Filled 2018-01-19: qty 5

## 2018-01-19 MED ORDER — INSULIN ASPART 100 UNIT/ML ~~LOC~~ SOLN
10.0000 [IU] | Freq: Once | SUBCUTANEOUS | Status: AC
  Administered 2018-01-19: 10 [IU] via SUBCUTANEOUS

## 2018-01-19 MED ORDER — PROPOFOL 10 MG/ML IV BOLUS
INTRAVENOUS | Status: AC
  Filled 2018-01-19: qty 20

## 2018-01-19 MED ORDER — LIDOCAINE 2% (20 MG/ML) 5 ML SYRINGE
INTRAMUSCULAR | Status: DC | PRN
  Administered 2018-01-19: 80 mg via INTRAVENOUS

## 2018-01-19 MED ORDER — DEXAMETHASONE SODIUM PHOSPHATE 10 MG/ML IJ SOLN
INTRAMUSCULAR | Status: AC
  Filled 2018-01-19: qty 1

## 2018-01-19 MED ORDER — SODIUM CHLORIDE 0.9 % IV SOLN
INTRAVENOUS | Status: AC | PRN
  Administered 2018-01-19: 250 mL

## 2018-01-19 MED ORDER — ANTICOAGULANT SODIUM CITRATE 4% (200MG/5ML) IV SOLN
Status: AC | PRN
  Administered 2018-01-19: 10 mL

## 2018-01-19 MED ORDER — EPHEDRINE SULFATE 50 MG/ML IJ SOLN
INTRAMUSCULAR | Status: DC | PRN
  Administered 2018-01-19: 5 mg via INTRAVENOUS
  Administered 2018-01-19: 10 mg via INTRAVENOUS
  Administered 2018-01-19: 15 mg via INTRAVENOUS

## 2018-01-19 SURGICAL SUPPLY — 42 items
ADH SKN CLS APL DERMABOND .7 (GAUZE/BANDAGES/DRESSINGS) ×1
BAG DECANTER FOR FLEXI CONT (MISCELLANEOUS) ×3 IMPLANT
BIOPATCH RED 1 DISK 7.0 (GAUZE/BANDAGES/DRESSINGS) ×2 IMPLANT
BIOPATCH RED 1IN DISK 7.0MM (GAUZE/BANDAGES/DRESSINGS) ×1
CATH PALINDROME RT-P 15FX19CM (CATHETERS) IMPLANT
CATH PALINDROME RT-P 15FX23CM (CATHETERS) IMPLANT
CATH PALINDROME RT-P 15FX28CM (CATHETERS) ×2 IMPLANT
CATH PALINDROME RT-P 15FX55CM (CATHETERS) IMPLANT
COVER PROBE W GEL 5X96 (DRAPES) ×2 IMPLANT
COVER SURGICAL LIGHT HANDLE (MISCELLANEOUS) ×3 IMPLANT
DECANTER SPIKE VIAL GLASS SM (MISCELLANEOUS) ×3 IMPLANT
DERMABOND ADVANCED (GAUZE/BANDAGES/DRESSINGS) ×2
DERMABOND ADVANCED .7 DNX12 (GAUZE/BANDAGES/DRESSINGS) IMPLANT
DRAPE C-ARM 42X72 X-RAY (DRAPES) ×3 IMPLANT
DRAPE CHEST BREAST 15X10 FENES (DRAPES) ×3 IMPLANT
GLOVE BIO SURGEON STRL SZ7 (GLOVE) ×2 IMPLANT
GLOVE BIOGEL PI IND STRL 7.0 (GLOVE) IMPLANT
GLOVE BIOGEL PI INDICATOR 7.0 (GLOVE) ×4
GLOVE SS BIOGEL STRL SZ 7.5 (GLOVE) ×1 IMPLANT
GLOVE SUPERSENSE BIOGEL SZ 7.5 (GLOVE) ×2
GOWN STRL REUS W/ TWL LRG LVL3 (GOWN DISPOSABLE) ×2 IMPLANT
GOWN STRL REUS W/TWL LRG LVL3 (GOWN DISPOSABLE) ×6
KIT BASIN OR (CUSTOM PROCEDURE TRAY) ×3 IMPLANT
KIT TURNOVER KIT B (KITS) ×3 IMPLANT
NDL 18GX1X1/2 (RX/OR ONLY) (NEEDLE) ×1 IMPLANT
NDL HYPO 25GX1X1/2 BEV (NEEDLE) ×1 IMPLANT
NEEDLE 18GX1X1/2 (RX/OR ONLY) (NEEDLE) ×3 IMPLANT
NEEDLE 22X1 1/2 (OR ONLY) (NEEDLE) IMPLANT
NEEDLE HYPO 25GX1X1/2 BEV (NEEDLE) ×3 IMPLANT
NS IRRIG 1000ML POUR BTL (IV SOLUTION) ×3 IMPLANT
PACK SURGICAL SETUP 50X90 (CUSTOM PROCEDURE TRAY) ×3 IMPLANT
PAD ARMBOARD 7.5X6 YLW CONV (MISCELLANEOUS) ×6 IMPLANT
SOAP 2 % CHG 4 OZ (WOUND CARE) ×3 IMPLANT
SUT ETHILON 3 0 PS 1 (SUTURE) ×3 IMPLANT
SUT VICRYL 4-0 PS2 18IN ABS (SUTURE) ×3 IMPLANT
SYR 10ML LL (SYRINGE) ×3 IMPLANT
SYR 20CC LL (SYRINGE) ×3 IMPLANT
SYR 5ML LL (SYRINGE) ×6 IMPLANT
SYR CONTROL 10ML LL (SYRINGE) ×3 IMPLANT
TOWEL GREEN STERILE (TOWEL DISPOSABLE) ×6 IMPLANT
TOWEL GREEN STERILE FF (TOWEL DISPOSABLE) ×3 IMPLANT
WATER STERILE IRR 1000ML POUR (IV SOLUTION) ×3 IMPLANT

## 2018-01-19 NOTE — Progress Notes (Signed)
PROGRESS NOTE    William [name]  BWG:[id number] DOB: 1950-12-08 DOA: 01/16/2018 PCP: Gildardo Pounds, NP  Brief Narrative: 67 y.o. male with medical history of CKD4 (AVF 5/24), DM, goodpasture syndrome, pancytopenia, hypertension, upper GI bleeding, cirrhosis with ascites, diastolic CHF who presented with shortness of breath. 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  -On IV  Lasix as above, stop Lasix   Type II diabetes mellitus with renal manifestations ([place]) -: Last A1c 8.1 on 06/20/17, poorly controled. -  With recurrent hypoglycemia, stopped insulin 70/30 and monitor  Pancytopenia ([place]):  -likely secondary to cirrhosis/splenic sequestration, unclear of Goodpasture's syndrome can contribute aynd CKD5 -Stable,  anemia panel with good iron stores  HTN:  -continue Coreg, will stop amlodipine  Cirrhosis of liver with ascites ([place]):  -etiology of this is unclear -Mental status stable, Ammonia level is  elevated at 51 -continue lactulose -exam with moderate ascites, may need paracentesis this admission  DVT ppx: SCDs due to low plts Code Status: Full code Family Communication: Yes, patient's son  at bed side Disposition Plan: home after volume removal with hemodialysis and clip   Consults: Renal vascular  Procedures: Right IJ tunneled hemodialysis catheter with SonoSite ultrasound visualization     Antimicrobials:    Subjective: -continues to have some shortness of breath, very poor appetite  Objective: Vitals:   01/19/18 1406 01/19/18 1421 01/19/18 1436 01/19/18 1512  BP: 129/64 127/68 133/65 136/63  Pulse: 73 73 73 75  Resp: 12 12 17    Temp:   97.6 F (36.4 C) 98.1 F (36.7 C)  TempSrc:    Oral  SpO2: 96% 96% 96% 97%  Weight:      Height:        Intake/Output Summary (Last 24 hours) at 01/19/2018 1515 Last data filed at 01/19/2018 1340 Gross per 24 hour  Intake 570 ml  Output 10 ml  Net 560 ml   Filed Weights   01/18/18 0402 01/19/18 0412 01/19/18 1145  Weight: 66.7 kg (147 lb 1.6 oz) 68.4 kg (150 lb 11.2 oz) 68.4 kg (150 lb 11.2 oz)    Examination: Gen: Awake, Alert, Oriented X 3,  HEENT: PERRLA, Neck supple, no JVD Lungs: bilateral Crackles more than midway both lungs CVS:as well as 2/regular rate rhythm, tachycardic Abd: soft, Non tender, non distended, BS present Extremities: 2+ edema Skin: no new rashes Psychiatry: Flat affect, limited by language barrier    Data Reviewed:   CBC: Recent Labs  Lab 01/16/18 1645 01/16/18 1731 01/17/18 0142 01/18/18 0354 01/19/18 0407  WBC  --  3.0* 3.0* 2.6* 2.8*  HGB 9.5* 9.1* 8.4* 8.4* 8.6*  HCT 28.0* 29.3* 26.7* 27.2* 27.8*  MCV  --  80.7 79.9 80.7 81.3  PLT  --  72* 83* 72* 78*   Basic Metabolic Panel: Recent Labs  Lab 01/16/18 1731 01/17/18 0142 01/17/18 1109 01/17/18 1418 01/18/18 0354 01/19/18 0407  NA 135 134*  --  135 136 134*  K 4.5 4.1  --  4.3 4.4 4.4  CL 109 109  --  110 110  108  CO2 14* 15*  --  15* 17* 16*  GLUCOSE 140* 180*  --  215* 82 226*  BUN 95* 96*  --  98* 96* 96*  CREATININE 4.90* 4.72*  --  4.91* 4.81* 4.67*  CALCIUM 7.1* 6.9*  --  7.0* 7.0* 7.3*  MG  --   --  2.6*  --   --   --   PHOS  --   --   --  6.8*  --   --    GFR: Estimated Creatinine Clearance: 13.5 mL/min (A) (by C-G formula based on SCr of 4.67 mg/dL (H)). Liver Function Tests: Recent Labs  Lab 01/17/18 1418 01/18/18 0354  AST  --  31  ALT  --  22  ALKPHOS  --  93  BILITOT  --  0.9  PROT  --  5.6*  ALBUMIN 3.0* 2.8*   No results for input(s): LIPASE, AMYLASE in the last 168 hours. Recent Labs  Lab 01/17/18 0452  AMMONIA 51*   Coagulation Profile: Recent Labs  Lab 01/17/18 0142  INR 1.28   Cardiac Enzymes: No results for input(s): CKTOTAL, CKMB, CKMBINDEX, TROPONINI in the last 168 hours. BNP (last 3 results) Recent Labs    09/25/17 1614  PROBNP 380.0*   HbA1C: No results for input(s): HGBA1C in the last 72 hours. CBG: Recent Labs  Lab 01/18/18 1616 01/18/18 2051 01/19/18 0733 01/19/18 1103 01/19/18 1353  GLUCAP 145* 152* 207* 142* 104*   Lipid Profile: No results for input(s): CHOL, HDL, LDLCALC, TRIG, CHOLHDL, LDLDIRECT in the last 72 hours. Thyroid Function Tests: No results for input(s): TSH, T4TOTAL, FREET4, T3FREE, THYROIDAB in the last 72 hours. Anemia Panel: Recent Labs    01/17/18 1113  VITAMINB12 1,195*  FOLATE 34.5  FERRITIN 563*  TIBC 238*  IRON 60  RETICCTPCT 1.1   Urine analysis:    Component Value Date/Time   COLORURINE YELLOW 12/19/2017 2230   APPEARANCEUR CLEAR 12/19/2017 2230   LABSPEC 1.011 12/19/2017 2230   PHURINE 7.0 12/19/2017 2230   GLUCOSEU NEGATIVE 12/19/2017 2230   HGBUR SMALL (A) 12/19/2017 2230   BILIRUBINUR NEGATIVE 12/19/2017 2230   BILIRUBINUR neg 09/14/2015 1441   KETONESUR NEGATIVE 12/19/2017 2230   PROTEINUR 100 (A) 12/19/2017 2230   UROBILINOGEN 0.2 09/14/2015 1441   UROBILINOGEN 0.2 01/27/2013  2052   NITRITE NEGATIVE 12/19/2017 2230   LEUKOCYTESUR NEGATIVE 12/19/2017 2230   Sepsis Labs: @LABRCNTIP (procalcitonin:4,lacticidven:4)  )No results found for this or any previous visit (from the past 240 hour(s)).       Radiology Studies: Dg Chest Port 1 View  Result Date: 01/19/2018 CLINICAL DATA:  Central catheter placement EXAM: PORTABLE CHEST 1 VIEW COMPARISON:  January 16, 2018 FINDINGS: Central catheter tip is in the right atrium. No pneumothorax. There is diffuse interstitial and patchy alveolar edema with right pleural effusion. There is cardiomegaly with pulmonary venous hypertension. No adenopathy. No bone lesions. IMPRESSION: Central catheter tip in right atrium slightly beyond the cavoatrial junction. No  pneumothorax. There is pulmonary vascular congestion with underlying congestive heart failure. Diffuse edema with small right pleural effusion. Electronically Signed   By: Lowella Grip III M.D.   On: 01/19/2018 14:20   Dg Fluoro Guide Cv Line-no Report  Result Date: 01/19/2018 Fluoroscopy was utilized by the requesting physician.  No radiographic interpretation.        Scheduled Meds: . amLODipine  10 mg Oral Daily  . calcium acetate  2,001 mg Oral TID WC  . carvedilol  12.5 mg Oral BID WC  . Chlorhexidine Gluconate Cloth  6 each Topical Q0600  . darbepoetin (ARANESP) injection - DIALYSIS  100 mcg Intravenous Q Mon-HD  . feeding supplement (PRO-STAT SUGAR FREE 64)  30 mL Oral BID  . ferrous gluconate  324 mg Oral Daily  . insulin aspart  0-9 Units Subcutaneous TID WC  . lactulose  20 g Oral BID  . multivitamin  1 tablet Oral QHS   Continuous Infusions: . sodium chloride 10 mL/hr at 01/19/18 1204  . furosemide Stopped (01/18/18 2330)     LOS: 3 days    Time spent: 53mn    PDomenic Polite MD [facility] Page via [email or web address], password [email or web address] After 7PM please contact night-coverage  01/19/2018, 3:15 PM

## 2018-01-19 NOTE — Progress Notes (Signed)
[facility] Progress Note   Subjective: s/p TDC in the OR today.  For HD #1.  Objective Vitals:   01/19/18 1406 01/19/18 1421 01/19/18 1436 01/19/18 1512  BP: 129/64 127/68 133/65 136/63  Pulse: 73 73 73 75  Resp: 12 12 17    Temp:  97.6 F (36.4 C) 97.6 F (36.4 C) 98.1 F (36.7 C)  TempSrc:    Oral  SpO2: 96% 96% 96% 97%  Weight:      Height:       Physical Exam General: Cooperative, NAD Heart: S1,S2 RRR. + JVD Lungs: Bilateral breath sounds, decreased in bases  Abdomen: active BS, full without frank fluid wave Extremities: Bilateral 1+pitting LE edema Dialysis Access: Maturing LUA AVF + bruit, R IJ TDC dressing c/d/i   Additional Objective Labs: Basic Metabolic Panel: Recent Labs  Lab 01/17/18 1418 01/18/18 0354 01/19/18 0407  NA 135 136 134*  K 4.3 4.4 4.4  CL 110 110 108  CO2 15* 17* 16*  GLUCOSE 215* 82 226*  BUN 98* 96* 96*  CREATININE 4.91* 4.81* 4.67*  CALCIUM 7.0* 7.0* 7.3*  PHOS 6.8*  --   --    Liver Function Tests: Recent Labs  Lab 01/17/18 1418 01/18/18 0354  AST  --  31  ALT  --  22  ALKPHOS  --  93  BILITOT  --  0.9  PROT  --  5.6*  ALBUMIN 3.0* 2.8*   No results for input(s): LIPASE, AMYLASE in the last 168 hours. CBC: Recent Labs  Lab 01/16/18 1731 01/17/18 0142 01/18/18 0354 01/19/18 0407  WBC 3.0* 3.0* 2.6* 2.8*  HGB 9.1* 8.4* 8.4* 8.6*  HCT 29.3* 26.7* 27.2* 27.8*  MCV 80.7 79.9 80.7 81.3  PLT 72* 83* 72* 78*   Blood Culture    Component Value Date/Time   SDES PERITONEAL 12/26/2017 1400   SDES PERITONEAL 12/26/2017 1400   SPECREQUEST NONE 12/26/2017 1400   SPECREQUEST NONE 12/26/2017 1400   CULT  12/26/2017 1400    NO GROWTH 5 DAYS Performed at [facility], [place] [address], [place], [place] [address]    REPTSTATUS 12/31/2017 FINAL 12/26/2017 1400   REPTSTATUS 12/26/2017 FINAL 12/26/2017 1400    Cardiac Enzymes: No results for input(s): CKTOTAL, CKMB, CKMBINDEX, TROPONINI in the last 168  hours. CBG: Recent Labs  Lab 01/18/18 2051 01/19/18 0733 01/19/18 1103 01/19/18 1353 01/19/18 1558  GLUCAP 152* 207* 142* 104* 183*   Iron Studies:  Recent Labs    01/17/18 1113  IRON 60  TIBC 238*  FERRITIN 563*   @[email or web address] @ Studies/Results: Dg Chest Port 1 View  Result Date: 01/19/2018 CLINICAL DATA:  Central catheter placement EXAM: PORTABLE CHEST 1 VIEW COMPARISON:  January 16, 2018 FINDINGS: Central catheter tip is in the right atrium. No pneumothorax. There is diffuse interstitial and patchy alveolar edema with right pleural effusion. There is cardiomegaly with pulmonary venous hypertension. No adenopathy. No bone lesions. IMPRESSION: Central catheter tip in right atrium slightly beyond the cavoatrial junction. No pneumothorax. There is pulmonary vascular congestion with underlying congestive heart failure. Diffuse edema with small right pleural effusion. Electronically Signed   By: Lowella Grip III M.D.   On: 01/19/2018 14:20   Dg Fluoro Guide Cv Line-no Report  Result Date: 01/19/2018 Fluoroscopy was utilized by the requesting physician.  No radiographic interpretation.   Medications: . sodium chloride 10 mL/hr at 01/19/18 1204   . calcium acetate  2,001 mg Oral TID WC  . carvedilol  12.5  mg Oral BID WC  . Chlorhexidine Gluconate Cloth  6 each Topical Q0600  . darbepoetin (ARANESP) injection - DIALYSIS  100 mcg Intravenous Q Mon-HD  . feeding supplement (PRO-STAT SUGAR FREE 64)  30 mL Oral BID  . ferrous gluconate  324 mg Oral Daily  . insulin aspart  0-9 Units Subcutaneous TID WC  . lactulose  20 g Oral BID  . multivitamin  1 tablet Oral QHS     Assessment/Plan: 1.  CKD stage 4/5 2/2 DM nephrosclerosis: Declining renal function-SCr 4.9 EGFR now down to 11-12. (SCr 3.55 EGFR 17 01/07/18)/ Patient has symptoms of uremia with volume excess. CXR shows pulmonary edema. Has had poor response to high dose IV diuretics.  S/p TDC, for HD #1 tonight. 2. Acute on  chronic diastolic HF-grade 1 diastolic dysfunction 92-11%. SOB improved with IV diuretics.  3.  Hypertension/volume  -BP fairly controlled on amlodipine and carvedilol. See # 1 4.  Anemia  - HGB 8.4 01/17/18. Fe 80 Tsat 33%. Went to infusion clinic 01/14/18-rec'd Epogen 10,000 units 01/14/18. HGB 8.4 today. Start Aranesp 100 mcg IV with HD. 5.  Metabolic bone disease -  Ca 7.0 C Ca 7.8  Phos 6.8,  PTH pending. Increased calcium based  binders, Use 2.5 Ca bath with HD.  6.  Nutrition - Albumin 3.0 Renal/Carb mod diet with fld restrictions. 7. DM-per primary.  8. Pancytopenia-followed by oncology. Thought to be autoimmune process 2/2 to Goodpasture disease.  9. Goodpasture disease-followed by oncology and pulmonary  10. Non-alcoholic cirrhosis with ascites Ammonia level 51. On Lactulose per primary.   Madelon Lips MD 01/19/2018, 4:45 PM  [facility] [phone]

## 2018-01-19 NOTE — Op Note (Signed)
    OPERATIVE REPORT  DATE OF SURGERY: 01/19/2018  PATIENT: William [name], 67 y.o. male MRN: [id number]  DOB: 06-Mar-1951  PRE-OPERATIVE DIAGNOSIS: End-stage renal disease  POST-OPERATIVE DIAGNOSIS:  Same  PROCEDURE: Right IJ tunneled hemodialysis catheter with SonoSite ultrasound visualization  SURGEON:  Curt Jews, M.D.  PHYSICIAN ASSISTANT: Nurse  ANESTHESIA: LMA  EBL: Minimal ml  Total I/O In: 400 [I.V.:400] Out: 10 [Blood:10]  BLOOD ADMINISTERED: None  DRAINS: None  SPECIMEN: None  COUNTS CORRECT:  YES  PLAN OF CARE: PACU with chest x-ray pending  PATIENT DISPOSITION:  PACU - hemodynamically stable  PROCEDURE DETAILS: Patient was taken to the operative placed in supine position where the area of the right and left neck were imaged with SonoSite ultrasound.  Patient had widely patent jugular veins bilaterally.  Patient wears prepped and draped in usual sterile fashion.  Using SonoSite ultrasound the right internal jugular vein was accessed with an 18-gauge needle and the guidewire was passed to the level of the right atrium this was confirmed with fluoroscopy.  Dilator and peel-away sheath was passed over the guidewire and the dilator and guidewire removed.  A 23 cm hemodialysis catheter was passed through the peel-away sheath and the sheath was removed.  The tips were placed at the level of the distal right atrium.  Catheter was brought through a subcutaneous tunnel through a separate stab incision.  2 lm ports were attached and both lumens flushed and aspirated easily.  The catheter was secured to the skin with a 3-0 nylon stitch and the entry site was closed with a 4-0 subcuticular Vicryl stitch.  Sterile dressing was applied and the patient was transferred to the recovery room in stable condition.  Patient has a religious reason why he cannot have poor products and therefore his catheter was flushed with sodium citrate   William [name], M.D., [facility] 01/19/2018 2:07  PM

## 2018-01-19 NOTE — Transfer of Care (Signed)
Immediate Anesthesia Transfer of Care Note  Patient: William [name]  Procedure(s) Performed: INSERTION OF DIALYSIS CATHETER - RIGHT INTERNAL JUGULAR PLACEMENT (N/A Chest)  Patient Location: PACU  Anesthesia Type:General  Level of Consciousness: drowsy and patient cooperative  Airway & Oxygen Therapy: Patient Spontanous Breathing and Patient connected to face mask oxygen  Post-op Assessment: Report given to RN and Post -op Vital signs reviewed and stable  Post vital signs: Reviewed and stable  Last Vitals:  Vitals Value Taken Time  BP 126/65 01/19/2018  1:51 PM  Temp    Pulse 72 01/19/2018  1:52 PM  Resp 12 01/19/2018  1:52 PM  SpO2 100 % 01/19/2018  1:52 PM  Vitals shown include unvalidated device data.  Last Pain:  Vitals:   01/19/18 0810  TempSrc:   PainSc: 0-No pain      Patients Stated Pain Goal: 0 (81/10/31 5945)  Complications: No apparent anesthesia complications

## 2018-01-19 NOTE — Anesthesia Procedure Notes (Signed)
Procedure Name: LMA Insertion Date/Time: 01/19/2018 1:04 PM Performed by: Freddie Breech, CRNA Pre-anesthesia Checklist: Patient identified, Emergency Drugs available, Suction available and Patient being monitored Patient Re-evaluated:Patient Re-evaluated prior to induction Oxygen Delivery Method: Circle System Utilized Preoxygenation: Pre-oxygenation with 100% oxygen Induction Type: IV induction Ventilation: Mask ventilation without difficulty LMA: LMA inserted LMA Size: 4.0 Number of attempts: 1 Airway Equipment and Method: Bite block Placement Confirmation: positive ETCO2 Tube secured with: Tape Dental Injury: Teeth and Oropharynx as per pre-operative assessment

## 2018-01-19 NOTE — Interval H&P Note (Signed)
History and Physical Interval Note:  01/19/2018 12:19 PM  William [name]  has presented today for surgery, with the diagnosis of ESRD  The various methods of treatment have been discussed with the patient and family. After consideration of risks, benefits and other options for treatment, the patient has consented to  Procedure(s): INSERTION OF DIALYSIS CATHETER (N/A) as a surgical intervention .  The patient's history has been reviewed, patient examined, no change in status, stable for surgery.  I have reviewed the patient's chart and labs.  Questions were answered to the patient's satisfaction.     Curt Jews

## 2018-01-19 NOTE — Progress Notes (Signed)
Inpatient Diabetes Program Recommendations  AACE/ADA: New Consensus Statement on Inpatient Glycemic Control (2015)  Target Ranges:  Prepandial:   less than 140 mg/dL      Peak postprandial:   less than 180 mg/dL (1-2 hours)      Critically ill patients:  140 - 180 mg/dL   Review of Glycemic Control  Diabetes history: DM 2 Outpatient Diabetes medications: 70/30 18 units BID Current orders for Inpatient glycemic control: Novolog 0-9 units tid  Inpatient Diabetes Program Recommendations:    Glucose 207 this am. Would watch trends for now.   If glucose continues to be in the 200 range would consider Q6 hour coverage throughout the day, since renal function is elevated.   Thanks,  Tama Headings RN, MSN, BC-ADM, [facility] Inpatient Diabetes Coordinator Team Pager [phone] (8a-5p)

## 2018-01-19 NOTE — Anesthesia Preprocedure Evaluation (Signed)
Anesthesia Evaluation  Patient identified by MRN, date of birth, ID band Patient awake    Reviewed: Allergy & Precautions, NPO status , Patient's Chart, lab work & pertinent test results, reviewed documented beta blocker date and time   Airway Mallampati: II  TM Distance: >3 FB Neck ROM: Full    Dental  (+) Dental Advisory Given, Poor Dentition   Pulmonary neg pulmonary ROS,    breath sounds clear to auscultation       Cardiovascular hypertension, Pt. on medications and Pt. on home beta blockers  Rhythm:Regular Rate:Normal     Neuro/Psych negative neurological ROS     GI/Hepatic GERD  ,  Endo/Other  diabetes, Poorly Controlled, Type 2  Renal/GU Renal InsufficiencyRenal disease     Musculoskeletal   Abdominal   Peds  Hematology  (+) anemia ,   Anesthesia Other Findings   Reproductive/Obstetrics                             Lab Results  Component Value Date   WBC 2.8 (L) 01/19/2018   HGB 8.6 (L) 01/19/2018   HCT 27.8 (L) 01/19/2018   MCV 81.3 01/19/2018   PLT 78 (L) 01/19/2018   Lab Results  Component Value Date   CREATININE 4.67 (H) 01/19/2018   BUN 96 (H) 01/19/2018   NA 134 (L) 01/19/2018   K 4.4 01/19/2018   CL 108 01/19/2018   CO2 16 (L) 01/19/2018    Anesthesia Physical  Anesthesia Plan  ASA: III  Anesthesia Plan: General   Post-op Pain Management:    Induction: Intravenous  PONV Risk Score and Plan: 2 and Ondansetron, Dexamethasone and Treatment may vary due to age or medical condition  Airway Management Planned: LMA  Additional Equipment:   Intra-op Plan:   Post-operative Plan: Extubation in OR  Informed Consent: I have reviewed the patients History and Physical, chart, labs and discussed the procedure including the risks, benefits and alternatives for the proposed anesthesia with the patient or authorized representative who has indicated his/her  understanding and acceptance.   Dental advisory given  Plan Discussed with: CRNA and Anesthesiologist  Anesthesia Plan Comments:         Anesthesia Quick Evaluation

## 2018-01-19 NOTE — Anesthesia Postprocedure Evaluation (Signed)
Anesthesia Post Note  Patient: William [name]  Procedure(s) Performed: INSERTION OF DIALYSIS CATHETER - RIGHT INTERNAL JUGULAR PLACEMENT (N/A Chest)     Patient location during evaluation: PACU Anesthesia Type: General Level of consciousness: awake and alert Pain management: pain level controlled Vital Signs Assessment: post-procedure vital signs reviewed and stable Respiratory status: spontaneous breathing, nonlabored ventilation, respiratory function stable and patient connected to nasal cannula oxygen Cardiovascular status: blood pressure returned to baseline and stable Postop Assessment: no apparent nausea or vomiting Anesthetic complications: no    Last Vitals:  Vitals:   01/19/18 0810 01/19/18 1351  BP: (!) 150/75 126/65  Pulse:  73  Resp:  14  Temp:  (!) 36.4 C  SpO2:  100%    Last Pain:  Vitals:   01/19/18 1351  TempSrc:   PainSc: 0-No pain                 Montez Hageman

## 2018-01-20 ENCOUNTER — Encounter (HOSPITAL_COMMUNITY): Payer: Self-pay | Admitting: Vascular Surgery

## 2018-01-20 ENCOUNTER — Ambulatory Visit: Payer: Medicaid Other | Admitting: Pulmonary Disease

## 2018-01-20 DIAGNOSIS — I132 Hypertensive heart and chronic kidney disease with heart failure and with stage 5 chronic kidney disease, or end stage renal disease: Secondary | ICD-10-CM | POA: Diagnosis not present

## 2018-01-20 DIAGNOSIS — R0602 Shortness of breath: Secondary | ICD-10-CM | POA: Diagnosis not present

## 2018-01-20 DIAGNOSIS — K7460 Unspecified cirrhosis of liver: Secondary | ICD-10-CM | POA: Diagnosis not present

## 2018-01-20 DIAGNOSIS — R188 Other ascites: Secondary | ICD-10-CM | POA: Diagnosis not present

## 2018-01-20 DIAGNOSIS — N186 End stage renal disease: Secondary | ICD-10-CM | POA: Diagnosis not present

## 2018-01-20 DIAGNOSIS — N2883 Nephroptosis: Secondary | ICD-10-CM | POA: Diagnosis not present

## 2018-01-20 DIAGNOSIS — N2581 Secondary hyperparathyroidism of renal origin: Secondary | ICD-10-CM | POA: Diagnosis not present

## 2018-01-20 DIAGNOSIS — I130 Hypertensive heart and chronic kidney disease with heart failure and stage 1 through stage 4 chronic kidney disease, or unspecified chronic kidney disease: Secondary | ICD-10-CM | POA: Diagnosis not present

## 2018-01-20 DIAGNOSIS — E1122 Type 2 diabetes mellitus with diabetic chronic kidney disease: Secondary | ICD-10-CM | POA: Diagnosis not present

## 2018-01-20 DIAGNOSIS — D631 Anemia in chronic kidney disease: Secondary | ICD-10-CM | POA: Diagnosis not present

## 2018-01-20 DIAGNOSIS — I10 Essential (primary) hypertension: Secondary | ICD-10-CM | POA: Diagnosis not present

## 2018-01-20 DIAGNOSIS — D61818 Other pancytopenia: Secondary | ICD-10-CM | POA: Diagnosis not present

## 2018-01-20 DIAGNOSIS — N184 Chronic kidney disease, stage 4 (severe): Secondary | ICD-10-CM | POA: Diagnosis not present

## 2018-01-20 DIAGNOSIS — I5033 Acute on chronic diastolic (congestive) heart failure: Secondary | ICD-10-CM | POA: Diagnosis not present

## 2018-01-20 LAB — CBC
HCT: 27.5 % — ABNORMAL LOW (ref 39.0–52.0)
Hemoglobin: 8.6 g/dL — ABNORMAL LOW (ref 13.0–17.0)
MCH: 25.2 pg — ABNORMAL LOW (ref 26.0–34.0)
MCHC: 31.3 g/dL (ref 30.0–36.0)
MCV: 80.6 fL (ref 78.0–100.0)
Platelets: 68 10*3/uL — ABNORMAL LOW (ref 150–400)
RBC: 3.41 MIL/uL — ABNORMAL LOW (ref 4.22–5.81)
RDW: 17.8 % — ABNORMAL HIGH (ref 11.5–15.5)
WBC: 2.4 10*3/uL — ABNORMAL LOW (ref 4.0–10.5)

## 2018-01-20 LAB — RENAL FUNCTION PANEL
Albumin: 3.1 g/dL — ABNORMAL LOW (ref 3.5–5.0)
Anion gap: 12 (ref 5–15)
BUN: 108 mg/dL — ABNORMAL HIGH (ref 6–20)
CO2: 15 mmol/L — ABNORMAL LOW (ref 22–32)
Calcium: 7.4 mg/dL — ABNORMAL LOW (ref 8.9–10.3)
Chloride: 106 mmol/L (ref 101–111)
Creatinine, Ser: 5.12 mg/dL — ABNORMAL HIGH (ref 0.61–1.24)
GFR calc Af Amer: 12 mL/min — ABNORMAL LOW (ref >60)
GFR calc non Af Amer: 11 mL/min — ABNORMAL LOW (ref >60)
Glucose, Bld: 400 mg/dL — ABNORMAL HIGH (ref 65–99)
Phosphorus: 6.3 mg/dL — ABNORMAL HIGH (ref 2.5–4.6)
Potassium: 5.2 mmol/L — ABNORMAL HIGH (ref 3.5–5.1)
Sodium: 133 mmol/L — ABNORMAL LOW (ref 135–145)

## 2018-01-20 LAB — GLUCOSE, CAPILLARY
GLUCOSE-CAPILLARY: 306 mg/dL — AB (ref 65–99)
GLUCOSE-CAPILLARY: 147 mg/dL — AB (ref 65–99)
Glucose-Capillary: 230 mg/dL — ABNORMAL HIGH (ref 65–99)
Glucose-Capillary: 219 mg/dL — ABNORMAL HIGH (ref 65–99)

## 2018-01-20 LAB — HEPATITIS B SURFACE ANTIGEN: Hepatitis B Surface Ag: NEGATIVE

## 2018-01-20 MED ORDER — DARBEPOETIN ALFA 100 MCG/0.5ML IJ SOSY
PREFILLED_SYRINGE | INTRAMUSCULAR | Status: AC
  Administered 2018-01-20: 100 ug via INTRAVENOUS
  Filled 2018-01-20: qty 0.5

## 2018-01-20 MED ORDER — ANTICOAGULANT SODIUM CITRATE 4% (200MG/5ML) IV SOLN
5.0000 mL | Status: DC | PRN
  Administered 2018-01-20: 5 mL via INTRAVENOUS
  Administered 2018-01-20: 3.4 mL via INTRAVENOUS
  Administered 2018-01-21 – 2018-01-23 (×2): 5 mL via INTRAVENOUS
  Filled 2018-01-20 (×3): qty 250
  Filled 2018-01-20: qty 3.4
  Filled 2018-01-20: qty 250

## 2018-01-20 MED ORDER — LIDOCAINE HCL (PF) 1 % IJ SOLN: 5.0000 mL | INTRAMUSCULAR | Status: DC | PRN

## 2018-01-20 MED ORDER — ALTEPLASE 2 MG IJ SOLR: 2.0000 mg | Freq: Once | INTRAMUSCULAR | Status: DC | PRN

## 2018-01-20 MED ORDER — PENTAFLUOROPROP-TETRAFLUOROETH EX AERO: 1.0000 "application " | INHALATION_SPRAY | CUTANEOUS | Status: DC | PRN

## 2018-01-20 MED ORDER — INSULIN GLARGINE 100 UNIT/ML ~~LOC~~ SOLN
10.0000 [IU] | Freq: Every day | SUBCUTANEOUS | Status: DC
  Administered 2018-01-20 – 2018-01-26 (×6): 10 [IU] via SUBCUTANEOUS
  Filled 2018-01-20 (×7): qty 0.1

## 2018-01-20 MED ORDER — SODIUM CHLORIDE 0.9 % IV SOLN: 100.0000 mL | INTRAVENOUS | Status: DC | PRN

## 2018-01-20 MED ORDER — LIDOCAINE-PRILOCAINE 2.5-2.5 % EX CREA: 1.0000 "application " | TOPICAL_CREAM | CUTANEOUS | Status: DC | PRN

## 2018-01-20 NOTE — Progress Notes (Signed)
[facility] Progress Note   Assessment/ Plan:   1. CKD stage4/5 2/2DM nephrosclerosis:Declining renal function-SCr 4.9 EGFR now down to 11-12. (SCr 3.55 EGFR 17 01/07/18)/ Patient has symptoms of uremia with volume excess. CXR shows pulmonary edema. Has had poor response to high dose IV diuretics.  S/p TDC, HD #1 6/17, HD #2 today 6/18.  CLIP in process. 2. Acute on chronic diastolic HF-grade 1 diastolic dysfunction 69-62%. Expect to improve with UF.   3. Hypertension/volume -BP fairly controlled on amlodipine and carvedilol. See # 1 4. Anemia - HGB 8.4 01/17/18. Fe 80 Tsat 33%. Went to infusion clinic 01/14/18-rec'd Epogen 10,000 units 01/14/18. HGB 8.4 today. Start Aranesp 100 mcg IV with HD. 5. Metabolic bone disease - Ca 7.0 C Ca 7.8  Phos 6.8,  PTH pending. Increased calcium based  binders, Use 2.5 Ca bath with HD.  6. Nutrition - Albumin 3.0 Renal/Carb mod diet with fld restrictions. 7. DM-per primary.  8. Pancytopenia-followed by oncology.  9. 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 Non-alcoholic cirrhosis with ascites Ammonia level 51. On Lactulose per primary. 11. Dispo: CLIP pending  Subjective:    Tolerating dialysis well.  HD #2 today.  No complaints.     Objective:   BP (!) 158/75 (BP Location: Right Arm)   Pulse 87   Temp 98.4 F (36.9 C) (Oral)   Resp 12   Ht 5' 5" (1.651 m)   Wt 67.3 kg (148 lb 5.9 oz)   SpO2 98%   BMI 24.69 kg/m   Physical Exam: General: Cooperative, NAD, appears well Heart: S1,S2 RRR. + JVD Lungs: Bilateral breath sounds, decreased in bases  Abdomen: active BS, full without frank fluid wave Extremities: Bilateral 1+pitting LE edema Dialysis Access: Maturing LUA AVF + bruit, R IJ TDC dressing c/d/i   Labs: BMET Recent Labs  Lab 01/16/18 1645  01/16/18 1731 01/17/18 0142 01/17/18 1418 01/18/18 0354 01/19/18 0407 01/19/18 2157 01/20/18 0037  NA 136 135 134* 135 136 134*  --  133*  K 4.5 4.5 4.1 4.3 4.4 4.4  --  5.2*  CL 106 109 109 110 110 108  --  106  CO2  --  14* 15* 15* 17* 16*  --  15*  GLUCOSE 145* 140* 180* 215* 82 226* 452* 400*  BUN 96* 95* 96* 98* 96* 96*  --  108*  CREATININE 5.20* 4.90* 4.72* 4.91* 4.81* 4.67*  --  5.12*  CALCIUM  --  7.1* 6.9* 7.0* 7.0* 7.3*  --  7.4*  PHOS  --   --   --  6.8*  --   --   --  6.3*   CBC Recent Labs  Lab 01/17/18 0142 01/18/18 0354 01/19/18 0407 01/20/18 0037  WBC 3.0* 2.6* 2.8* 2.4*  HGB 8.4* 8.4* 8.6* 8.6*  HCT 26.7* 27.2* 27.8* 27.5*  MCV 79.9 80.7 81.3 80.6  PLT 83* 72* 78* 68*    _0 @ Medications:    . calcium acetate  2,001 mg Oral TID WC  . carvedilol  12.5 mg Oral BID WC  . Chlorhexidine Gluconate Cloth  6 each Topical Q0600  . darbepoetin (ARANESP) injection - DIALYSIS  100 mcg Intravenous Q Mon-HD  . feeding supplement (PRO-STAT SUGAR FREE 64)  30 mL Oral BID  . ferrous gluconate  324 mg Oral Daily  . insulin  aspart  0-9 Units Subcutaneous TID WC  . insulin glargine  10 Units Subcutaneous Daily  . lactulose  20 g Oral BID  . multivitamin  1 tablet Oral QHS       MD 01/20/2018, 2:11 PM  

## 2018-01-20 NOTE — Progress Notes (Signed)
HD tx initiated via HD cath w/o problem, pull/push/flush equally w/o problem, VSS, will cont to monitor while on HD tx

## 2018-01-20 NOTE — Procedures (Signed)
Patient seen and examined on Hemodialysis. QB 300 mL/ min, UF goal 1L.  HD #2.  Tolerating well.  Treatment adjusted as needed.  Madelon Lips MD [facility] pgr [phone] 2:14 PM

## 2018-01-20 NOTE — Progress Notes (Signed)
PROGRESS NOTE    William [name]  WUJ:[id number] DOB: 1951/06/08 DOA: 01/16/2018 PCP: Gildardo Pounds, NP  Brief Narrative: 67 y.o. male with medical history of CKD4 (AVF 5/24), DM, goodpasture syndrome, pancytopenia, hypertension, upper GI bleeding, cirrhosis with ascites, diastolic CHF who presented with shortness of breath. 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  -poor response to diuretics , stopped Lasix  -started HD now  Type II diabetes mellitus with renal manifestations ([place]) -: Last A1c 8.1 on 06/20/17, poorly controled. -  With recurrent hypoglycemia, stopped insulin 70/30 and now with hyperglycemia -restarted Insulin 70/30, monitor  Pancytopenia ([place]):  -likely secondary to cirrhosis/splenic sequestration, unclear of  Goodpasture's syndrome can contribute aynd CKD5 -Stable at baseline,  anemia panel with good iron stores  HTN:  -continue Coreg,  stopped amlodipine  Cirrhosis of liver with ascites ([place]):  -etiology of this is unclear -Mental status stable, Ammonia level was elevated at 51 on admission -continue lactulose, no asterixes  DVT ppx: SCDs due to low plts Code Status: Full code Family Communication: Son  at bed side Disposition Plan: home after volume removal with hemodialysis and clip   Consults:  Renal vascular  Procedures: Right IJ tunneled hemodialysis catheter with SonoSite ultrasound visualization     Antimicrobials:    Subjective: -feels better, breathing improving after starting hemodialysis was able to eat a little better today as well  Objective: Vitals:   01/20/18 0337 01/20/18 0417 01/20/18 0814 01/20/18 1400  BP: (!) 169/85 (!) 145/59 (!) 169/77 (!) 158/75  Pulse: 84 87  87  Resp: 15 16  12   Temp: 98 F (36.7 C) 98.4 F (36.9 C)    TempSrc: Oral Oral    SpO2: 100% 94%  98%  Weight: 67.3 kg (148 lb 5.9 oz)     Height:        Intake/Output Summary (Last 24 hours) at 01/20/2018 1432 Last data filed at 01/20/2018 0330 Gross per 24 hour  Intake -  Output 1009 ml  Net -1009 ml   Filed Weights   01/19/18 1145 01/20/18 0050 01/20/18 0337  Weight: 68.4 kg (150 lb 11.2 oz) 68.3 kg (150 lb 9.2 oz) 67.3 kg (148 lb 5.9 oz)    Examination: Gen: Awake, Alert, pleasant, speaks Arabic communicates through his son, in no distress HEENT: PERRLA, Neck supple, no JVD Lungs: basilar crackles improving CVS: S1S2/RRR Abd: soft, Non tender, non distended, BS present Extremities: 1plus  edema Skin: no new rashes Psychiatry: Flat affect, limited by language barrier    Data Reviewed:   CBC: Recent Labs  Lab 01/16/18 1731 01/17/18 0142 01/18/18 0354 01/19/18 0407 01/20/18 0037  WBC 3.0* 3.0* 2.6* 2.8* 2.4*  HGB 9.1* 8.4* 8.4* 8.6* 8.6*  HCT 29.3* 26.7*  27.2* 27.8* 27.5*  MCV 80.7 79.9 80.7 81.3 80.6  PLT 72* 83* 72* 78* 68*   Basic Metabolic Panel: Recent Labs  Lab 01/17/18 0142 01/17/18 1109 01/17/18 1418 01/18/18 0354 01/19/18 0407 01/19/18 2157 01/20/18 0037  NA 134*  --  135 136 134*  --  133*  K 4.1  --  4.3 4.4 4.4  --  5.2*  CL 109  --  110 110 108  --  106  CO2 15*  --  15* 17* 16*  --  15*  GLUCOSE 180*  --  215* 82 226* 452* 400*  BUN 96*  --  98* 96* 96*  --  108*  CREATININE 4.72*  --  4.91* 4.81* 4.67*  --  5.12*  CALCIUM 6.9*  --  7.0* 7.0* 7.3*  --  7.4*  MG  --  2.6*  --   --   --   --   --   PHOS  --   --  6.8*  --   --   --  6.3*   GFR: Estimated Creatinine Clearance: 12.3 mL/min (A) (by C-G formula based on SCr of 5.12 mg/dL (H)). Liver Function Tests: Recent Labs  Lab 01/17/18 1418 01/18/18 0354 01/20/18 0037  AST  --  31  --   ALT  --  22  --   ALKPHOS  --  93  --   BILITOT  --  0.9  --   PROT  --  5.6*  --   ALBUMIN 3.0* 2.8* 3.1*   No results for input(s): LIPASE, AMYLASE in the last 168 hours. Recent Labs  Lab 01/17/18 0452  AMMONIA 51*   Coagulation Profile: Recent Labs  Lab 01/17/18 0142  INR 1.28   Cardiac Enzymes: No results for input(s): CKTOTAL, CKMB, CKMBINDEX, TROPONINI in the last 168 hours. BNP (last 3 results) Recent Labs    09/25/17 1614  PROBNP 380.0*   HbA1C: No results for input(s): HGBA1C in the last 72 hours. CBG: Recent Labs  Lab 01/19/18 1558 01/19/18 2131 01/19/18 2305 01/20/18 0803 01/20/18 1119  GLUCAP 183* 440* 408* 230* 306*   Lipid Profile: No results for input(s): CHOL, HDL, LDLCALC, TRIG, CHOLHDL, LDLDIRECT in the last 72 hours. Thyroid Function Tests: No results for input(s): TSH, T4TOTAL, FREET4, T3FREE, THYROIDAB in the last 72 hours. Anemia Panel: No results for input(s): VITAMINB12, FOLATE, FERRITIN, TIBC, IRON, RETICCTPCT in the last 72 hours. Urine analysis:    Component Value Date/Time   COLORURINE YELLOW 12/19/2017 2230    APPEARANCEUR CLEAR 12/19/2017 2230   LABSPEC 1.011 12/19/2017 2230   PHURINE 7.0 12/19/2017 2230   GLUCOSEU NEGATIVE 12/19/2017 2230   HGBUR SMALL (A) 12/19/2017 2230   BILIRUBINUR NEGATIVE 12/19/2017 2230   BILIRUBINUR neg 09/14/2015 1441   KETONESUR NEGATIVE 12/19/2017 2230   PROTEINUR 100 (A) 12/19/2017 2230   UROBILINOGEN 0.2 09/14/2015 1441   UROBILINOGEN 0.2 01/27/2013 2052   NITRITE NEGATIVE 12/19/2017 2230   LEUKOCYTESUR NEGATIVE 12/19/2017 2230   Sepsis Labs: @LABRCNTIP (procalcitonin:4,lacticidven:4)  )No results found for this or any previous visit (from the past 240 hour(s)).       Radiology Studies: Dg Chest [facility]  Result Date: 01/19/2018 CLINICAL DATA:  Central catheter placement EXAM: PORTABLE CHEST 1 VIEW COMPARISON:  January 16, 2018 FINDINGS: Central catheter tip is in the right atrium. No pneumothorax. There is diffuse interstitial and patchy alveolar edema with right pleural effusion. There is cardiomegaly with pulmonary venous hypertension. No adenopathy. No bone lesions. IMPRESSION: Central catheter tip in right atrium slightly beyond the cavoatrial junction. No pneumothorax. There is pulmonary vascular congestion with underlying congestive heart failure. Diffuse edema with small right pleural effusion. Electronically Signed   By: Lowella Grip III M.D.   On: 01/19/2018 14:20   Dg Fluoro Guide Cv Line-no Report  Result Date: 01/19/2018 Fluoroscopy was utilized by the requesting physician.  No radiographic interpretation.        Scheduled Meds: . calcium acetate  2,001 mg Oral TID WC  . carvedilol  12.5 mg Oral BID WC  . Chlorhexidine Gluconate Cloth  6 each Topical Q0600  . darbepoetin (ARANESP) injection - DIALYSIS  100 mcg Intravenous Q Mon-HD  . feeding supplement (PRO-STAT SUGAR FREE 64)  30 mL Oral BID  . ferrous gluconate  324 mg Oral Daily  . insulin aspart  0-9 Units Subcutaneous TID WC  . insulin glargine  10 Units Subcutaneous Daily    . lactulose  20 g Oral BID  . multivitamin  1 tablet Oral QHS   Continuous Infusions: . sodium chloride    . sodium chloride    . sodium chloride 10 mL/hr at 01/19/18 1204  . anticoagulant sodium citrate       LOS: 4 days    Time spent: 58mn    PDomenic Polite MD [facility] Page via [email or web address], password [email or web address] After 7PM please contact night-coverage  01/20/2018, 2:32 PM

## 2018-01-20 NOTE — Progress Notes (Signed)
HD tx completed @ 0330 w/o problem, UF goal met, blood rinsed back, VSS, report called to Chanetta Marshall, RN

## 2018-01-21 DIAGNOSIS — N2581 Secondary hyperparathyroidism of renal origin: Secondary | ICD-10-CM | POA: Diagnosis not present

## 2018-01-21 DIAGNOSIS — I130 Hypertensive heart and chronic kidney disease with heart failure and stage 1 through stage 4 chronic kidney disease, or unspecified chronic kidney disease: Secondary | ICD-10-CM | POA: Diagnosis not present

## 2018-01-21 DIAGNOSIS — I5033 Acute on chronic diastolic (congestive) heart failure: Secondary | ICD-10-CM | POA: Diagnosis not present

## 2018-01-21 DIAGNOSIS — E1122 Type 2 diabetes mellitus with diabetic chronic kidney disease: Secondary | ICD-10-CM | POA: Diagnosis not present

## 2018-01-21 DIAGNOSIS — D631 Anemia in chronic kidney disease: Secondary | ICD-10-CM | POA: Diagnosis not present

## 2018-01-21 DIAGNOSIS — R0602 Shortness of breath: Secondary | ICD-10-CM

## 2018-01-21 DIAGNOSIS — N2883 Nephroptosis: Secondary | ICD-10-CM | POA: Diagnosis not present

## 2018-01-21 DIAGNOSIS — N186 End stage renal disease: Secondary | ICD-10-CM | POA: Diagnosis not present

## 2018-01-21 DIAGNOSIS — D61818 Other pancytopenia: Secondary | ICD-10-CM | POA: Diagnosis not present

## 2018-01-21 DIAGNOSIS — N184 Chronic kidney disease, stage 4 (severe): Secondary | ICD-10-CM | POA: Diagnosis not present

## 2018-01-21 DIAGNOSIS — I132 Hypertensive heart and chronic kidney disease with heart failure and with stage 5 chronic kidney disease, or end stage renal disease: Secondary | ICD-10-CM | POA: Diagnosis not present

## 2018-01-21 DIAGNOSIS — R188 Other ascites: Secondary | ICD-10-CM | POA: Diagnosis not present

## 2018-01-21 LAB — AMMONIA: AMMONIA: 27 umol/L (ref 9–35)

## 2018-01-21 LAB — BASIC METABOLIC PANEL
Anion gap: 7 (ref 5–15)
BUN: 33 mg/dL — AB (ref 6–20)
CHLORIDE: 99 mmol/L — AB (ref 101–111)
CO2: 27 mmol/L (ref 22–32)
CREATININE: 2.7 mg/dL — AB (ref 0.61–1.24)
Calcium: 7.5 mg/dL — ABNORMAL LOW (ref 8.9–10.3)
GFR calc non Af Amer: 23 mL/min — ABNORMAL LOW (ref >60)
GFR, EST AFRICAN AMERICAN: 27 mL/min — AB (ref >60)
Glucose, Bld: 280 mg/dL — ABNORMAL HIGH (ref 65–99)
Potassium: 3.7 mmol/L (ref 3.5–5.1)
Sodium: 133 mmol/L — ABNORMAL LOW (ref 135–145)

## 2018-01-21 LAB — GLUCOSE, CAPILLARY
GLUCOSE-CAPILLARY: 122 mg/dL — AB (ref 65–99)
GLUCOSE-CAPILLARY: 152 mg/dL — AB (ref 65–99)
Glucose-Capillary: 356 mg/dL — ABNORMAL HIGH (ref 65–99)

## 2018-01-21 LAB — CBC
HCT: 28.4 % — ABNORMAL LOW (ref 39.0–52.0)
HEMOGLOBIN: 8.9 g/dL — AB (ref 13.0–17.0)
MCH: 25 pg — AB (ref 26.0–34.0)
MCHC: 31.3 g/dL (ref 30.0–36.0)
MCV: 79.8 fL (ref 78.0–100.0)
PLATELETS: 70 10*3/uL — AB (ref 150–400)
RBC: 3.56 MIL/uL — AB (ref 4.22–5.81)
RDW: 17.7 % — ABNORMAL HIGH (ref 11.5–15.5)
WBC: 3.3 10*3/uL — ABNORMAL LOW (ref 4.0–10.5)

## 2018-01-21 MED ORDER — AMLODIPINE BESYLATE 10 MG PO TABS
10.0000 mg | ORAL_TABLET | Freq: Every day | ORAL | Status: DC
  Administered 2018-01-21 – 2018-01-26 (×6): 10 mg via ORAL
  Filled 2018-01-21 (×6): qty 1

## 2018-01-21 MED ORDER — NEPRO/CARBSTEADY PO LIQD
237.0000 mL | Freq: Two times a day (BID) | ORAL | Status: DC
  Administered 2018-01-21 – 2018-01-25 (×4): 237 mL via ORAL

## 2018-01-21 NOTE — Progress Notes (Signed)
[facility] Progress Note   Assessment/ Plan:   1. 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 Had poor response to high dose IV diuretics.  S/p [facility], HD #1 6/17, HD #2, 6/18, HD #3 today 6/19.  CLIP in process. 2. Acute on chronic diastolic HF-grade 1 diastolic dysfunction 98-92%. Expect to improve with UF.   3. Hypertension/volume -BP fairly controlled on amlodipine and carvedilol. 4. Anemia - HGB 8.4 01/17/18. Fe 80 Tsat 33%. Went to infusion clinic 01/14/18-rec'd Epogen 10,000 units 01/14/18.. Start Aranesp 100 mcg IV q week. 5. Metabolic bone disease - Ca 7.0 C Ca 7.8  Phos 6.8,  PTH 138 Increased calcium based  binders, Use 2.5 Ca bath with HD.  6. Nutrition - Albumin 3.0 Renal/Carb mod diet with fld restrictions. 7. DM-per primary.  8. Pancytopenia-followed by oncology.  9. 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 Non-alcoholic cirrhosis with ascites Ammonia level 51. On Lactulose per primary. 11. Dispo: CLIP pending  Subjective:    HD #3 today.  No complaints.  No acute events overnight.  Blood sugars are relatively uncontrolled (200s-400s)   Objective:   BP 125/71   Pulse 80   Temp 98.5 F (36.9 C) (Oral)   Resp 12   Ht 5' 5"  (1.651 m)   Wt 66.1 kg (145 lb 11.6 oz)   SpO2 97%   BMI 24.25 kg/m   Physical Exam: General: Cooperative, NAD, appears well Heart: S1,S2 RRR. JVD improved Lungs: Bilateral breath sounds, clear today Abdomen: active BS, full without frank fluid wave Extremities: Bilateral trace pitting LE edema Dialysis Access: Maturing LUA AVF + bruit, R IJ TDC dressing c/d/i   Labs: BMET Recent Labs  Lab 01/16/18 1731 01/17/18 0142 01/17/18 1418 01/18/18 0354 01/19/18 0407 01/19/18 2157 01/20/18 0037 01/21/18 0417  NA 135  134* 135 136 134*  --  133* 133*  K 4.5 4.1 4.3 4.4 4.4  --  5.2* 3.7  CL 109 109 110 110 108  --  106 99*  CO2 14* 15* 15* 17* 16*  --  15* 27  GLUCOSE 140* 180* 215* 82 226* 452* 400* 280*  BUN 95* 96* 98* 96* 96*  --  108* 33*  CREATININE 4.90* 4.72* 4.91* 4.81* 4.67*  --  5.12* 2.70*  CALCIUM 7.1* 6.9* 7.0* 7.0* 7.3*  --  7.4* 7.5*  PHOS  --   --  6.8*  --   --   --  6.3*  --    CBC Recent Labs  Lab 01/18/18 0354 01/19/18 0407 01/20/18 0037 01/21/18 0417  WBC 2.6* 2.8* 2.4* 3.3*  HGB 8.4* 8.6* 8.6* 8.9*  HCT 27.2* 27.8* 27.5* 28.4*  MCV 80.7 81.3 80.6 79.8  PLT 72* 78* 68* 70*    @IMGRELPRIORS @ Medications:    . calcium acetate  2,001 mg Oral TID WC  . carvedilol  12.5 mg Oral BID WC  . Chlorhexidine Gluconate Cloth  6 each Topical Q0600  . darbepoetin (ARANESP) injection - DIALYSIS  100 mcg Intravenous Q Mon-HD  . feeding supplement (PRO-STAT SUGAR FREE 64)  30 mL Oral BID  . ferrous gluconate  324 mg Oral Daily  . insulin aspart  0-9 Units Subcutaneous TID WC  . insulin glargine  10 Units Subcutaneous Daily  . lactulose  20 g Oral BID  .  multivitamin  1 tablet Oral QHS     Madelon Lips MD 01/21/2018, 9:08 AM

## 2018-01-21 NOTE — Procedures (Signed)
Patient seen and examined on Hemodialysis. QB 400 mL/ min, UF goal 1.5L.  HD #3.  Tolerating well.  Treatment adjusted as needed.  Madelon Lips MD [facility] pgr [phone] 9:11 AM

## 2018-01-21 NOTE — Progress Notes (Signed)
PROGRESS NOTE    William [name]  KGU:[id number] DOB: 07-21-1951 DOA: 01/16/2018 PCP: Gildardo Pounds, NP   Brief Narrative:  67 year old with past medical history relevant for type 2 diabetes on insulin, hypertension, stage IV chronic kidney disease now on dialysis (prior required acute dialysis) comp gated by hypoproliferative anemia and metabolic bone disease, chronic diastolic heart failure with grade 1 diastolic dysfunction by echo on 10/21/2016, glomerular basement membrane disease with no evidence of renal involvement on biopsy (reportedly biopsy showed only shows FSGS consistent with type 2 diabetes) however IF was not performed, nonalcoholic cirrhosis with ascites who was admitted with shortness of breath and found to be volume overloaded and is currently on dialysis.  Assessment & Plan:   Principal Problem:   SOB (shortness of breath) Active Problems:   Type II diabetes mellitus with renal manifestations ([facility])   Acute on chronic diastolic CHF (congestive heart failure) (H[facility])   Pancytopenia ([facility])   Essential hypertension   Cirrhosis of liver with ascites ([facility])   CKD (chronic kidney disease) stage 4, GFR 15-29 ml/min ([facility])   Fluid overload   #) Shortness of breath due to end-stage renal disease: Currently patient most likely cause of fluid overload is secondary to his ESRD.  He is prior been on dialysis but was apparently removed.  He is currently on day 3 of hemodialysis with good resolution of volume overload and is currently on room air. -Nephrology consult -Pending outpatient bed for dialysis - Continue calcium acetate 3 times daily  #) Acute on chronic diastolic heart failure exacerbation: Improving with dialysis. -Discontinue home furosemide  #) Hypoproliferative anemia: Multifactorial and likely related to both anemia of chronic disease and CKD/ESRD. - Continue epo supplementation -Continue oral iron supplementation daily  #) Nonalcoholic cirrhosis: Unclear etiology  however this is attributed to metabolic syndrome. -We will manage fluids with dialysis -Continue lactulose  #) Type 2 diabetes: Patient is on 70/30 with 18 units at home. -Continue glargine 10 units nightly -Sliding scale insulin -Renal/carb restricted diet  #) Hypertension: -Continue carvedilol 12.5 mg twice daily - Continue home amlodipine 10 mg daily  #) Glomerular basement membrane disease: This is apparently not a firm diagnosis as his antiglomerular basement membrane antibody was low positive and his ANCA was negative.  He received pulse dose steroids and cyclophosphamide. -Stable  #) Pancytopenia: Reportedly per the chart this is secondary to his Goodpasture's -Stable  Fluids: Restrict Electrodes: Monitor and supplement Exline nutrition: Renal/carb restricted diet  Prophylaxis: Heparin  Disposition: Pending hemodialysis bed  Full code    Consultants:   Nephrology  Procedures:   Hemodialysis  Antimicrobials:  None   Subjective: Through the Arabic interpreter patient reports he is doing well.  Is not having complaints.  Reports his breathing is much improved.  He does not have any pain.  Objective: Vitals:   01/21/18 0930 01/21/18 1000 01/21/18 1020 01/21/18 1050  BP: (!) 147/72 (!) 147/73 (!) 148/77 (!) 158/71  Pulse: 78 74 74 78  Resp:   16 17  Temp:   98.4 F (36.9 C) 98.5 F (36.9 C)  TempSrc:   Oral Oral  SpO2:   100% 99%  Weight:   64.3 kg (141 lb 12.1 oz)   Height:        Intake/Output Summary (Last 24 hours) at 01/21/2018 1308 Last data filed at 01/21/2018 1020 Gross per 24 hour  Intake 596 ml  Output 2500 ml  Net -1904 ml   Filed Weights   01/21/18 0335  01/21/18 0647 01/21/18 1020  Weight: 66 kg (145 lb 9.6 oz) 66.1 kg (145 lb 11.6 oz) 64.3 kg (141 lb 12.1 oz)    Examination:  General exam: Appears calm and comfortable  Respiratory system: Clear to auscultation. Respiratory effort normal. Cardiovascular system: Regular rate and  rhythm, no murmurs Gastrointestinal system: Soft, mildly distended, dullness at sides, plus bowel sounds, no rebound or guarding Central nervous system: Alert and oriented. No focal neurological deficits. Extremities: No lower extremity edema Skin: Left upper extremity AV fistula with bruit and thrill, right IJ site is clean dry intact Psychiatry: Judgement and insight appear normal. Mood & affect appropriate.     Data Reviewed: I have personally reviewed following labs and imaging studies  CBC: Recent Labs  Lab 01/17/18 0142 01/18/18 0354 01/19/18 0407 01/20/18 0037 01/21/18 0417  WBC 3.0* 2.6* 2.8* 2.4* 3.3*  HGB 8.4* 8.4* 8.6* 8.6* 8.9*  HCT 26.7* 27.2* 27.8* 27.5* 28.4*  MCV 79.9 80.7 81.3 80.6 79.8  PLT 83* 72* 78* 68* 70*   Basic Metabolic Panel: Recent Labs  Lab 01/17/18 1109 01/17/18 1418 01/18/18 0354 01/19/18 0407 01/19/18 2157 01/20/18 0037 01/21/18 0417  NA  --  135 136 134*  --  133* 133*  K  --  4.3 4.4 4.4  --  5.2* 3.7  CL  --  110 110 108  --  106 99*  CO2  --  15* 17* 16*  --  15* 27  GLUCOSE  --  215* 82 226* 452* 400* 280*  BUN  --  98* 96* 96*  --  108* 33*  CREATININE  --  4.91* 4.81* 4.67*  --  5.12* 2.70*  CALCIUM  --  7.0* 7.0* 7.3*  --  7.4* 7.5*  MG 2.6*  --   --   --   --   --   --   PHOS  --  6.8*  --   --   --  6.3*  --    GFR: Estimated Creatinine Clearance: 23.4 mL/min (A) (by C-G formula based on SCr of 2.7 mg/dL (H)). Liver Function Tests: Recent Labs  Lab 01/17/18 1418 01/18/18 0354 01/20/18 0037  AST  --  31  --   ALT  --  22  --   ALKPHOS  --  93  --   BILITOT  --  0.9  --   PROT  --  5.6*  --   ALBUMIN 3.0* 2.8* 3.1*   No results for input(s): LIPASE, AMYLASE in the last 168 hours. Recent Labs  Lab 01/17/18 0452 01/21/18 0417  AMMONIA 51* 27   Coagulation Profile: Recent Labs  Lab 01/17/18 0142  INR 1.28   Cardiac Enzymes: No results for input(s): CKTOTAL, CKMB, CKMBINDEX, TROPONINI in the last 168  hours. BNP (last 3 results) Recent Labs    09/25/17 1614  PROBNP 380.0*   HbA1C: No results for input(s): HGBA1C in the last 72 hours. CBG: Recent Labs  Lab 01/20/18 0803 01/20/18 1119 01/20/18 1748 01/20/18 2111 01/21/18 1052  GLUCAP 230* 306* 147* 219* 122*   Lipid Profile: No results for input(s): CHOL, HDL, LDLCALC, TRIG, CHOLHDL, LDLDIRECT in the last 72 hours. Thyroid Function Tests: No results for input(s): TSH, T4TOTAL, FREET4, T3FREE, THYROIDAB in the last 72 hours. Anemia Panel: No results for input(s): VITAMINB12, FOLATE, FERRITIN, TIBC, IRON, RETICCTPCT in the last 72 hours. Sepsis Labs: No results for input(s): PROCALCITON, LATICACIDVEN in the last 168 hours.  No results found for this  or any previous visit (from the past 240 hour(s)).       Radiology Studies: Dg Chest Port 1 View  Result Date: 01/19/2018 CLINICAL DATA:  Central catheter placement EXAM: PORTABLE CHEST 1 VIEW COMPARISON:  January 16, 2018 FINDINGS: Central catheter tip is in the right atrium. No pneumothorax. There is diffuse interstitial and patchy alveolar edema with right pleural effusion. There is cardiomegaly with pulmonary venous hypertension. No adenopathy. No bone lesions. IMPRESSION: Central catheter tip in right atrium slightly beyond the cavoatrial junction. No pneumothorax. There is pulmonary vascular congestion with underlying congestive heart failure. Diffuse edema with small right pleural effusion. Electronically Signed   By: Lowella Grip III M.D.   On: 01/19/2018 14:20   Dg Fluoro Guide Cv Line-no Report  Result Date: 01/19/2018 Fluoroscopy was utilized by the requesting physician.  No radiographic interpretation.        Scheduled Meds: . calcium acetate  2,001 mg Oral TID WC  . carvedilol  12.5 mg Oral BID WC  . Chlorhexidine Gluconate Cloth  6 each Topical Q0600  . darbepoetin (ARANESP) injection - DIALYSIS  100 mcg Intravenous Q Mon-HD  . feeding supplement  (PRO-STAT SUGAR FREE 64)  30 mL Oral BID  . ferrous gluconate  324 mg Oral Daily  . insulin aspart  0-9 Units Subcutaneous TID WC  . insulin glargine  10 Units Subcutaneous Daily  . lactulose  20 g Oral BID  . multivitamin  1 tablet Oral QHS   Continuous Infusions: . sodium chloride 10 mL/hr at 01/19/18 1204  . anticoagulant sodium citrate       LOS: 5 days    Time spent: [place], MD [facility]  If 7PM-7AM, please contact night-coverage [email or web address] Password [email or web address] 01/21/2018, 1:08 PM

## 2018-01-21 NOTE — Progress Notes (Signed)
Initial Nutrition Assessment  DOCUMENTATION CODES:   Not applicable  INTERVENTION:    Nepro Shake po BID, each supplement provides 425 kcal and 19 grams protein  Renal diet education provided to patient's son  NUTRITION DIAGNOSIS:   Inadequate oral intake related to poor appetite as evidenced by per patient/family report.  GOAL:   Patient will meet greater than or equal to 90% of their needs  MONITOR:   PO intake, Supplement acceptance  REASON FOR ASSESSMENT:        ASSESSMENT:   67 yo male with PMH of DM2 on insulin, HTN, stage IV CKD, hypoproliferative anemia, metabolic bone disease, CHF, nonalcoholic cirrhosis who was admitted on 6/14 with volume overload. New to HD this admission.  Spoke with patient's son, Maris Berger. He reports that patient has been eating poorly since admission. He is typically not a big eater at home. He had gained some weight a few months ago, but weight has trended back down with volume removal. Usual weight 145-150 lbs.  Labs and medications reviewed. Patient received his 3rd dialysis treatment today.   Reviewed renal diet education with patient's son. Patient was sleeping during most of RD visit. Provided renal pyramid handout to patient/family. Reviewed food groups and provided written recommended serving sizes specifically determined for patient's current nutritional status.  Explained why diet restrictions are needed and provided lists of foods to limit/avoid that are high potassium, sodium, and phosphorus. Provided specific recommendations on safer alternatives of these foods.  Discussed importance of protein intake at each meal and snack. Provided examples of how to maximize protein intake throughout the day. Discussed need for fluid restriction with dialysis, importance of minimizing weight gain between HD treatments, and renal-friendly beverage options. Encouraged pt to discuss specific diet questions/concerns with RD at HD  outpatient facility. Teach back method used.   NUTRITION - FOCUSED PHYSICAL EXAM:    Most Recent Value  Orbital Region  No depletion  Upper Arm Region  No depletion  Thoracic and Lumbar Region  No depletion  Buccal Region  No depletion  Temple Region  No depletion  Clavicle Bone Region  No depletion  Clavicle and Acromion Bone Region  No depletion  Scapular Bone Region  No depletion  Dorsal Hand  No depletion  Patellar Region  Unable to assess  Anterior Thigh Region  Unable to assess  Posterior Calf Region  Mild depletion  Edema (RD Assessment)  Mild  Hair  Reviewed  Eyes  Reviewed  Mouth  Reviewed  Skin  Reviewed  Nails  Reviewed       Diet Order:   Diet Order           Diet renal with fluid restriction Fluid restriction: 1200 mL Fluid; Room service appropriate? Yes; Fluid consistency: Thin  Diet effective now          EDUCATION NEEDS:   Education needs have been addressed  Skin:  Skin Assessment: Skin Integrity Issues: Skin Integrity Issues:: Incisions Incisions: R chest & L arm  Last BM:  6/18  Height:   Ht Readings from Last 1 Encounters:  01/19/18 5' 5"  (1.651 m)    Weight:   Wt Readings from Last 1 Encounters:  01/21/18 141 lb 12.1 oz (64.3 kg)    Ideal Body Weight:  61.8 kg  BMI:  Body mass index is 23.59 kg/m.  Estimated Nutritional Needs:   Kcal:  1850-2150  Protein:  80-90 gm  Fluid:  1.2 L    Molli Barrows, RD,  LDN, [place] Pager [phone] After Hours Pager [phone]

## 2018-01-22 ENCOUNTER — Encounter: Payer: Self-pay | Admitting: Emergency Medicine

## 2018-01-22 DIAGNOSIS — D631 Anemia in chronic kidney disease: Secondary | ICD-10-CM | POA: Diagnosis not present

## 2018-01-22 DIAGNOSIS — R0602 Shortness of breath: Secondary | ICD-10-CM | POA: Diagnosis not present

## 2018-01-22 DIAGNOSIS — D61818 Other pancytopenia: Secondary | ICD-10-CM | POA: Diagnosis not present

## 2018-01-22 DIAGNOSIS — N2581 Secondary hyperparathyroidism of renal origin: Secondary | ICD-10-CM | POA: Insufficient documentation

## 2018-01-22 DIAGNOSIS — I132 Hypertensive heart and chronic kidney disease with heart failure and with stage 5 chronic kidney disease, or end stage renal disease: Secondary | ICD-10-CM | POA: Diagnosis not present

## 2018-01-22 DIAGNOSIS — I130 Hypertensive heart and chronic kidney disease with heart failure and stage 1 through stage 4 chronic kidney disease, or unspecified chronic kidney disease: Secondary | ICD-10-CM | POA: Diagnosis not present

## 2018-01-22 DIAGNOSIS — N184 Chronic kidney disease, stage 4 (severe): Secondary | ICD-10-CM | POA: Diagnosis not present

## 2018-01-22 DIAGNOSIS — D509 Iron deficiency anemia, unspecified: Secondary | ICD-10-CM | POA: Insufficient documentation

## 2018-01-22 DIAGNOSIS — N186 End stage renal disease: Secondary | ICD-10-CM | POA: Diagnosis not present

## 2018-01-22 DIAGNOSIS — I5033 Acute on chronic diastolic (congestive) heart failure: Secondary | ICD-10-CM | POA: Diagnosis not present

## 2018-01-22 DIAGNOSIS — R188 Other ascites: Secondary | ICD-10-CM | POA: Diagnosis not present

## 2018-01-22 DIAGNOSIS — N2883 Nephroptosis: Secondary | ICD-10-CM | POA: Diagnosis not present

## 2018-01-22 DIAGNOSIS — E1122 Type 2 diabetes mellitus with diabetic chronic kidney disease: Secondary | ICD-10-CM | POA: Diagnosis not present

## 2018-01-22 LAB — CBC
HCT: 31.7 % — ABNORMAL LOW (ref 39.0–52.0)
Hemoglobin: 9.6 g/dL — ABNORMAL LOW (ref 13.0–17.0)
MCH: 24.7 pg — ABNORMAL LOW (ref 26.0–34.0)
MCHC: 30.3 g/dL (ref 30.0–36.0)
MCV: 81.7 fL (ref 78.0–100.0)
Platelets: 73 K/uL — ABNORMAL LOW (ref 150–400)
RBC: 3.88 MIL/uL — ABNORMAL LOW (ref 4.22–5.81)
RDW: 17.9 % — ABNORMAL HIGH (ref 11.5–15.5)
WBC: 4.1 K/uL (ref 4.0–10.5)

## 2018-01-22 LAB — MAGNESIUM: Magnesium: 1.9 mg/dL (ref 1.7–2.4)

## 2018-01-22 LAB — RENAL FUNCTION PANEL
Albumin: 3.1 g/dL — ABNORMAL LOW (ref 3.5–5.0)
Anion gap: 9 (ref 5–15)
BUN: 22 mg/dL — ABNORMAL HIGH (ref 6–20)
CO2: 28 mmol/L (ref 22–32)
Calcium: 7.9 mg/dL — ABNORMAL LOW (ref 8.9–10.3)
Chloride: 101 mmol/L (ref 101–111)
Creatinine, Ser: 2.44 mg/dL — ABNORMAL HIGH (ref 0.61–1.24)
GFR calc Af Amer: 30 mL/min — ABNORMAL LOW (ref >60)
GFR calc non Af Amer: 26 mL/min — ABNORMAL LOW (ref >60)
Glucose, Bld: 158 mg/dL — ABNORMAL HIGH (ref 65–99)
Phosphorus: 2.6 mg/dL (ref 2.5–4.6)
Potassium: 4.3 mmol/L (ref 3.5–5.1)
Sodium: 138 mmol/L (ref 135–145)

## 2018-01-22 LAB — GLUCOSE, CAPILLARY
Glucose-Capillary: 272 mg/dL — ABNORMAL HIGH (ref 65–99)
Glucose-Capillary: 235 mg/dL — ABNORMAL HIGH (ref 65–99)
Glucose-Capillary: 140 mg/dL — ABNORMAL HIGH (ref 65–99)
Glucose-Capillary: 117 mg/dL — ABNORMAL HIGH (ref 65–99)

## 2018-01-22 NOTE — Progress Notes (Signed)
PROGRESS NOTE    William [name]  XNA:[id number] DOB: 1951-05-29 DOA: 01/16/2018 PCP: Gildardo Pounds, NP   Brief Narrative:  67 year old with past medical history relevant for type 2 diabetes on insulin, hypertension, stage IV chronic kidney disease now on dialysis (prior required acute dialysis) comp gated by hypoproliferative anemia and metabolic bone disease, chronic diastolic heart failure with grade 1 diastolic dysfunction by echo on 10/21/2016, glomerular basement membrane disease with no evidence of renal involvement on biopsy (reportedly biopsy showed only shows FSGS consistent with type 2 diabetes) however IF was not performed, nonalcoholic cirrhosis with ascites who was admitted with shortness of breath and found to be volume overloaded and is currently on dialysis.  Assessment & Plan:   Principal Problem:   SOB (shortness of breath) Active Problems:   Type II diabetes mellitus with renal manifestations (HCC)   Acute on chronic diastolic CHF (congestive heart failure) (HCC)   Pancytopenia (HCC)   Essential hypertension   Cirrhosis of liver with ascites (HCC)   CKD (chronic kidney disease) stage 4, GFR 15-29 ml/min (HCC)   Fluid overload   #) Shortness of breath due to end-stage renal disease: Resolved with dialysis -Nephrology consult -Pending outpatient bed for dialysis - Continue calcium acetate 3 times daily  #) Acute on chronic diastolic heart failure exacerbation: Resolved -Discontinue home furosemide  #) Hypoproliferative anemia: Multifactorial and likely related to both anemia of chronic disease and CKD/ESRD. - Continue epo supplementation -Continue oral iron supplementation daily   #) Nonalcoholic cirrhosis: Unclear etiology however this is attributed to metabolic syndrome. -We will manage fluids with dialysis -Continue lactulose  #) Type 2 diabetes: Patient is on 70/30 with 18 units at home. -Continue glargine 10 units nightly -Sliding scale  insulin -Renal/carb restricted diet  #) Hypertension: -Continue carvedilol 12.5 mg twice daily - Continue home amlodipine 10 mg daily  #) Anti-glomerular basement membrane disease: This is apparently not a firm diagnosis as his antiglomerular basement membrane antibody was low positive and his ANCA was negative.  He received pulse dose steroids and cyclophosphamide. -Stable  #) Pancytopenia: Reportedly per the chart this is secondary to his Goodpasture's -Stable  Fluids: Restrict Electrodes: Monitor and supplemen  nutrition: Renal/carb restricted diet  Prophylaxis: Heparin  Disposition: Pending hemodialysis bed  Full code    Consultants:   Nephrology  Procedures:   Hemodialysis  Antimicrobials:  None   Subjective: Through the Arabic interpreter patient reports he is doing well.  He again is requesting when he can go home.  He does not have any complaints of chest pain, nausea, vomiting, diarrhea.  He denies any shortness of breath.  Objective: Vitals:   01/21/18 1050 01/21/18 1415 01/21/18 2134 01/22/18 0417  BP: (!) 158/71 (!) 150/71 (!) 130/49 (!) 154/66  Pulse: 78 87 79 80  Resp: 17  11 18   Temp: 98.5 F (36.9 C) 98.5 F (36.9 C) 99 F (37.2 C) 98.1 F (36.7 C)  TempSrc: Oral Oral Oral   SpO2: 99% 99% 98% 96%  Weight:    64.5 kg (142 lb 3.2 oz)  Height:        Intake/Output Summary (Last 24 hours) at 01/22/2018 1049 Last data filed at 01/21/2018 1700 Gross per 24 hour  Intake 0 ml  Output -  Net 0 ml   Filed Weights   01/21/18 0647 01/21/18 1020 01/22/18 0417  Weight: 66.1 kg (145 lb 11.6 oz) 64.3 kg (141 lb 12.1 oz) 64.5 kg (142 lb 3.2 oz)  Examination:  General exam: Appears calm and comfortable  Respiratory system: Clear to auscultation. Respiratory effort normal. Cardiovascular system: Regular rate and rhythm, no murmurs Gastrointestinal system: Soft, mildly distended, dullness at sides, plus bowel sounds, no rebound or  guarding Central nervous system: Alert and oriented. No focal neurological deficits. Extremities: No lower extremity edema Skin: Left upper extremity AV fistula with bruit and thrill, right IJ site is clean dry intact Psychiatry: Judgement and insight appear normal. Mood & affect appropriate.     Data Reviewed: I have personally reviewed following labs and imaging studies  CBC: Recent Labs  Lab 01/18/18 0354 01/19/18 0407 01/20/18 0037 01/21/18 0417 01/22/18 0430  WBC 2.6* 2.8* 2.4* 3.3* 4.1  HGB 8.4* 8.6* 8.6* 8.9* 9.6*  HCT 27.2* 27.8* 27.5* 28.4* 31.7*  MCV 80.7 81.3 80.6 79.8 81.7  PLT 72* 78* 68* 70* 73*   Basic Metabolic Panel: Recent Labs  Lab 01/17/18 1109 01/17/18 1418 01/18/18 0354 01/19/18 0407 01/19/18 2157 01/20/18 0037 01/21/18 0417 01/22/18 0430  NA  --  135 136 134*  --  133* 133* 138  K  --  4.3 4.4 4.4  --  5.2* 3.7 4.3  CL  --  110 110 108  --  106 99* 101  CO2  --  15* 17* 16*  --  15* 27 28  GLUCOSE  --  215* 82 226* 452* 400* 280* 158*  BUN  --  98* 96* 96*  --  108* 33* 22*  CREATININE  --  4.91* 4.81* 4.67*  --  5.12* 2.70* 2.44*  CALCIUM  --  7.0* 7.0* 7.3*  --  7.4* 7.5* 7.9*  MG 2.6*  --   --   --   --   --   --  1.9  PHOS  --  6.8*  --   --   --  6.3*  --  2.6   GFR: Estimated Creatinine Clearance: 25.9 mL/min (A) (by C-G formula based on SCr of 2.44 mg/dL (H)). Liver Function Tests: Recent Labs  Lab 01/17/18 1418 01/18/18 0354 01/20/18 0037 01/22/18 0430  AST  --  31  --   --   ALT  --  22  --   --   ALKPHOS  --  93  --   --   BILITOT  --  0.9  --   --   PROT  --  5.6*  --   --   ALBUMIN 3.0* 2.8* 3.1* 3.1*   No results for input(s): LIPASE, AMYLASE in the last 168 hours. Recent Labs  Lab 01/17/18 0452 01/21/18 0417  AMMONIA 51* 27   Coagulation Profile: Recent Labs  Lab 01/17/18 0142  INR 1.28   Cardiac Enzymes: No results for input(s): CKTOTAL, CKMB, CKMBINDEX, TROPONINI in the last 168 hours. BNP (last 3  results) Recent Labs    09/25/17 1614  PROBNP 380.0*   HbA1C: No results for input(s): HGBA1C in the last 72 hours. CBG: Recent Labs  Lab 01/20/18 2111 01/21/18 1052 01/21/18 1623 01/21/18 2134 01/22/18 0736  GLUCAP 219* 122* 356* 152* 272*   Lipid Profile: No results for input(s): CHOL, HDL, LDLCALC, TRIG, CHOLHDL, LDLDIRECT in the last 72 hours. Thyroid Function Tests: No results for input(s): TSH, T4TOTAL, FREET4, T3FREE, THYROIDAB in the last 72 hours. Anemia Panel: No results for input(s): VITAMINB12, FOLATE, FERRITIN, TIBC, IRON, RETICCTPCT in the last 72 hours. Sepsis Labs: No results for input(s): PROCALCITON, LATICACIDVEN in the last 168 hours.  No results  found for this or any previous visit (from the past 240 hour(s)).       Radiology Studies: No results found.      Scheduled Meds: . amLODipine  10 mg Oral Daily  . calcium acetate  2,001 mg Oral TID WC  . carvedilol  12.5 mg Oral BID WC  . Chlorhexidine Gluconate Cloth  6 each Topical Q0600  . darbepoetin (ARANESP) injection - DIALYSIS  100 mcg Intravenous Q Mon-HD  . feeding supplement (NEPRO CARB STEADY)  237 mL Oral BID BM  . feeding supplement (PRO-STAT SUGAR FREE 64)  30 mL Oral BID  . ferrous gluconate  324 mg Oral Daily  . insulin aspart  0-9 Units Subcutaneous TID WC  . insulin glargine  10 Units Subcutaneous Daily  . lactulose  20 g Oral BID  . multivitamin  1 tablet Oral QHS   Continuous Infusions: . sodium chloride 10 mL/hr at 01/19/18 1204  . anticoagulant sodium citrate       LOS: 6 days    Time spent: [place], MD [facility]  If 7PM-7AM, please contact night-coverage [email or web address] Password [facility] 01/22/2018, 10:49 AM

## 2018-01-22 NOTE — Progress Notes (Signed)
This is a late entry note from [place] meeting with patient's son at bedside 01/21/18.  CSW consulted for transportation resources for outpatient HD. CSW met with patient's son at bedside; patient asleep. Patient's son reported he can take patient to dialysis, but wanted a back up option in the event he is unable to take him one day. CSW provided SCAT application and instructions and completed Professional Verification section of application. Faxed Professional Verification to SCAT eligibility office.   CSW signing off, as no additional needs identified at this time.  Estanislado Emms, [place]

## 2018-01-22 NOTE — Progress Notes (Signed)
Inpatient Diabetes Program Recommendations  AACE/ADA: New Consensus Statement on Inpatient Glycemic Control (2015)  Target Ranges:  Prepandial:   less than 140 mg/dL      Peak postprandial:   less than 180 mg/dL (1-2 hours)      Critically ill patients:  140 - 180 mg/dL   Results for DANNEY, BUNGERT (MRN [id number]) as of 01/22/2018 11:38  Ref. Range 01/21/2018 10:52 01/21/2018 16:23 01/21/2018 21:34 01/22/2018 07:36 01/22/2018 11:07  Glucose-Capillary Latest Ref Range: 65 - 99 mg/dL 122 (H) 356 (H) 152 (H) 272 (H) 235 (H)   Review of Glycemic Control  Diabetes history: DM2 Outpatient Diabetes medications: 70/30 18 units BID Current orders for Inpatient glycemic control: Lantus 10 units daily, Novolog 0-9 units TID with meals  Inpatient Diabetes Program Recommendations: Correction (SSI): Please consider ordering Novolog 0-5 units QHS for bedtime correction. Insulin - Meal Coverage: Please consider ordering Novolog 3 units TID with meals for meal coverage if patient eats at least 50% of meals.  Thanks, Barnie Alderman, RN, MSN, CDE Diabetes Coordinator Inpatient Diabetes Program [phone] (Team Pager from 8am to 5pm)

## 2018-01-22 NOTE — Progress Notes (Signed)
[facility] Progress Note   Assessment/ Plan:   1. 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 Had poor response to high dose IV diuretics.  S/p TDC, HD #1 6/17, HD #2, 6/18, HD #3 6/19, next planned 6/21  CLIP in process. 2. Acute on chronic diastolic HF-grade 1 diastolic dysfunction 62-26%. Expect to improve with UF.   3. Hypertension/volume -BP fairly controlled on amlodipine and carvedilol. 4. Anemia - HGB 8.4 01/17/18. Fe 80 Tsat 33%. Went to infusion clinic 01/14/18-rec'd Epogen 10,000 units 01/14/18.. Start Aranesp 100 mcg IV q week. 5. Metabolic bone disease - Ca 7.0 C Ca 7.8  Phos 6.8,  PTH 138 Increased calcium based  binders, Use 2.5 Ca bath with HD.  6. Nutrition - Albumin 3.0 Renal/Carb mod diet with fld restrictions. 7. DM-per primary.  8. Pancytopenia-followed by oncology.  9. 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 Non-alcoholic cirrhosis with ascites Ammonia level 51. On Lactulose per primary. 11. Dispo: CLIP pending  Subjective:    HD #3 yesterday.  Tolerated well.  Son with good questions this AM.   Objective:   BP (!) 154/66 (BP Location: Right Arm)   Pulse 80   Temp 98.1 F (36.7 C)   Resp 18   Ht 5' 5"  (1.651 m)   Wt 64.5 kg (142 lb 3.2 oz)   SpO2 96%   BMI 23.66 kg/m   Physical Exam: General: Cooperative, NAD, appears well Heart: S1,S2 RRR. JVD improved Lungs: Bilateral breath sounds, clear today Abdomen: active BS, ascites better Extremities: Bilateral trace pitting LE edema Dialysis Access: Maturing LUA AVF + bruit, R IJ TDC dressing c/d/i   Labs: BMET Recent Labs  Lab 01/17/18 0142 01/17/18 1418 01/18/18 0354 01/19/18 0407 01/19/18 2157 01/20/18 0037 01/21/18 0417 01/22/18 0430  NA 134* 135 136 134*  --  133*  133* 138  K 4.1 4.3 4.4 4.4  --  5.2* 3.7 4.3  CL 109 110 110 108  --  106 99* 101  CO2 15* 15* 17* 16*  --  15* 27 28  GLUCOSE 180* 215* 82 226* 452* 400* 280* 158*  BUN 96* 98* 96* 96*  --  108* 33* 22*  CREATININE 4.72* 4.91* 4.81* 4.67*  --  5.12* 2.70* 2.44*  CALCIUM 6.9* 7.0* 7.0* 7.3*  --  7.4* 7.5* 7.9*  PHOS  --  6.8*  --   --   --  6.3*  --  2.6   CBC Recent Labs  Lab 01/19/18 0407 01/20/18 0037 01/21/18 0417 01/22/18 0430  WBC 2.8* 2.4* 3.3* 4.1  HGB 8.6* 8.6* 8.9* 9.6*  HCT 27.8* 27.5* 28.4* 31.7*  MCV 81.3 80.6 79.8 81.7  PLT 78* 68* 70* 73*    @IMGRELPRIORS @ Medications:    . amLODipine  10 mg Oral Daily  . calcium acetate  2,001 mg Oral TID WC  . carvedilol  12.5 mg Oral BID WC  . Chlorhexidine Gluconate Cloth  6 each Topical Q0600  . darbepoetin (ARANESP) injection - DIALYSIS  100 mcg Intravenous Q Mon-HD  . feeding supplement (NEPRO CARB STEADY)  237 mL Oral BID BM  . feeding supplement (PRO-STAT SUGAR FREE 64)  30 mL Oral BID  . ferrous gluconate  324 mg Oral Daily  . insulin aspart  0-9 Units Subcutaneous TID WC  .  insulin glargine  10 Units Subcutaneous Daily  . lactulose  20 g Oral BID  . multivitamin  1 tablet Oral QHS     Madelon Lips MD 01/22/2018, 12:24 PM

## 2018-01-23 ENCOUNTER — Inpatient Hospital Stay (HOSPITAL_COMMUNITY): Payer: Medicare Other

## 2018-01-23 DIAGNOSIS — E1122 Type 2 diabetes mellitus with diabetic chronic kidney disease: Secondary | ICD-10-CM | POA: Diagnosis not present

## 2018-01-23 DIAGNOSIS — R188 Other ascites: Secondary | ICD-10-CM | POA: Diagnosis not present

## 2018-01-23 DIAGNOSIS — D631 Anemia in chronic kidney disease: Secondary | ICD-10-CM | POA: Diagnosis not present

## 2018-01-23 DIAGNOSIS — R0602 Shortness of breath: Secondary | ICD-10-CM | POA: Diagnosis not present

## 2018-01-23 DIAGNOSIS — I5033 Acute on chronic diastolic (congestive) heart failure: Secondary | ICD-10-CM | POA: Diagnosis not present

## 2018-01-23 DIAGNOSIS — N2581 Secondary hyperparathyroidism of renal origin: Secondary | ICD-10-CM | POA: Diagnosis not present

## 2018-01-23 DIAGNOSIS — N186 End stage renal disease: Secondary | ICD-10-CM | POA: Diagnosis not present

## 2018-01-23 DIAGNOSIS — I132 Hypertensive heart and chronic kidney disease with heart failure and with stage 5 chronic kidney disease, or end stage renal disease: Secondary | ICD-10-CM | POA: Diagnosis not present

## 2018-01-23 DIAGNOSIS — I130 Hypertensive heart and chronic kidney disease with heart failure and stage 1 through stage 4 chronic kidney disease, or unspecified chronic kidney disease: Secondary | ICD-10-CM | POA: Diagnosis not present

## 2018-01-23 DIAGNOSIS — D61818 Other pancytopenia: Secondary | ICD-10-CM | POA: Diagnosis not present

## 2018-01-23 DIAGNOSIS — N2883 Nephroptosis: Secondary | ICD-10-CM | POA: Diagnosis not present

## 2018-01-23 DIAGNOSIS — N184 Chronic kidney disease, stage 4 (severe): Secondary | ICD-10-CM | POA: Diagnosis not present

## 2018-01-23 LAB — RENAL FUNCTION PANEL
Albumin: 3 g/dL — ABNORMAL LOW (ref 3.5–5.0)
Anion gap: 10 (ref 5–15)
BUN: 38 mg/dL — ABNORMAL HIGH (ref 6–20)
CO2: 26 mmol/L (ref 22–32)
Calcium: 8.2 mg/dL — ABNORMAL LOW (ref 8.9–10.3)
Chloride: 100 mmol/L — ABNORMAL LOW (ref 101–111)
Creatinine, Ser: 3.4 mg/dL — ABNORMAL HIGH (ref 0.61–1.24)
GFR calc Af Amer: 20 mL/min — ABNORMAL LOW (ref >60)
GFR calc non Af Amer: 17 mL/min — ABNORMAL LOW (ref >60)
Glucose, Bld: 240 mg/dL — ABNORMAL HIGH (ref 65–99)
Phosphorus: 3.2 mg/dL (ref 2.5–4.6)
Potassium: 4.5 mmol/L (ref 3.5–5.1)
Sodium: 136 mmol/L (ref 135–145)

## 2018-01-23 LAB — CBC
HCT: 30.3 % — ABNORMAL LOW (ref 39.0–52.0)
HEMOGLOBIN: 9.2 g/dL — AB (ref 13.0–17.0)
MCH: 25.1 pg — AB (ref 26.0–34.0)
MCHC: 30.4 g/dL (ref 30.0–36.0)
MCV: 82.8 fL (ref 78.0–100.0)
PLATELETS: 57 10*3/uL — AB (ref 150–400)
RBC: 3.66 MIL/uL — AB (ref 4.22–5.81)
RDW: 18.1 % — ABNORMAL HIGH (ref 11.5–15.5)
WBC: 3.5 10*3/uL — ABNORMAL LOW (ref 4.0–10.5)

## 2018-01-23 LAB — GLUCOSE, CAPILLARY
GLUCOSE-CAPILLARY: 119 mg/dL — AB (ref 65–99)
Glucose-Capillary: 221 mg/dL — ABNORMAL HIGH (ref 65–99)
Glucose-Capillary: 261 mg/dL — ABNORMAL HIGH (ref 65–99)

## 2018-01-23 LAB — PROCALCITONIN: Procalcitonin: 0.39 ng/mL

## 2018-01-23 NOTE — Care Management Note (Signed)
Case Management Note  Patient Details  Name: William [name] MRN: [id number] Date of Birth: September 08, 1950  Subjective/Objective: Pt presented for CKD Stage 5 ESRD- new start to HD. Clip is completed-Pt has been accepted to [facility] 1st treatment Tuesday: June 25,2019 at 11:00am. Schedule and chair time Tuesday,Thursday,Saturday at 11:45am-2nd shift. PTA from home and plan will be to return home.                Action/Plan: SCAT Application has been completed per CSW- son will be able to assist with transportation as well. Pt is active with Medicaid. Pt should not have any issues with obtaining medications. No further needs from CM at this time.   Expected Discharge Date:                  Expected Discharge Plan:  Home/Self Care  In-House Referral:  NA  Discharge planning Services  CM Consult  Post Acute Care Choice:  NA Choice offered to:  NA  DME Arranged:  N/A DME Agency:  NA  HH Arranged:  NA HH Agency:  NA  Status of Service:  Completed, signed off  If discussed at [place] of Stay Meetings, dates discussed:    Additional Comments:  Bethena Roys, RN 01/23/2018, 3:42 PM

## 2018-01-23 NOTE — Progress Notes (Signed)
Accepted at [facility] .1st treatment Tuesday, June 25,2019 at 11:00am .Schedule and chair time Tuesday,Thursday,Saturday at 11:45am .2nd shift

## 2018-01-23 NOTE — Progress Notes (Signed)
PROGRESS NOTE    William [name]  NGE:[id number] DOB: June 09, 1951 DOA: 01/16/2018 PCP: Gildardo Pounds, NP   Brief Narrative:  67 year old with past medical history relevant for type 2 diabetes on insulin, hypertension, stage IV chronic kidney disease now on dialysis (prior required acute dialysis) comp gated by hypoproliferative anemia and metabolic bone disease, chronic diastolic heart failure with grade 1 diastolic dysfunction by echo on 10/21/2016, glomerular basement membrane disease with no evidence of renal involvement on biopsy (reportedly biopsy showed only shows FSGS consistent with type 2 diabetes) however IF was not performed, nonalcoholic cirrhosis with ascites who was admitted with shortness of breath and found to be volume overloaded and is currently on dialysis.  Assessment & Plan:   Principal Problem:   SOB (shortness of breath) Active Problems:   Type II diabetes mellitus with renal manifestations (HCC)   Acute on chronic diastolic CHF (congestive heart failure) (HCC)   Pancytopenia (HCC)   Essential hypertension   Cirrhosis of liver with ascites (HCC)   CKD (chronic kidney disease) stage 4, GFR 15-29 ml/min (HCC)   Fluid overload   #) Low-grade fever: Last night patient developed low-grade fever 200.8.  No localizing signs or symptoms.  He otherwise clinically looks and feels well.  His blood work this morning is fairly reassuring. -Blood cultures x2 obtained -Procalcitonin ordered  #) Shortness of breath due to end-stage renal disease: Resolved with dialysis -Nephrology consult -Pending outpatient bed for dialysis - Continue calcium acetate 3 times daily  #) Acute on chronic diastolic heart failure exacerbation: Resolved -Discontinue home furosemide  #) Hypoproliferative anemia: Multifactorial and likely related to both anemia of chronic disease and CKD/ESRD. - Continue epo supplementation -Continue oral iron supplementation daily   #) Nonalcoholic cirrhosis:  Unclear etiology however this is attributed to metabolic syndrome. -We will manage fluids with dialysis -Continue lactulose  #) Type 2 diabetes: Patient is on 70/30 with 18 units at home. -Continue glargine 10 units nightly -Sliding scale insulin -Renal/carb restricted diet  #) Hypertension: -Continue carvedilol 12.5 mg twice daily - Continue home amlodipine 10 mg daily  #) Anti-glomerular basement membrane disease: This is apparently not a firm diagnosis as his antiglomerular basement membrane antibody was low positive and his ANCA was negative.  He received pulse dose steroids and cyclophosphamide. -Stable  #) Pancytopenia: Reportedly per the chart this is secondary to his Goodpasture's -Stable  Fluids: Restrict Electrodes: Monitor and supplemen  nutrition: Renal/carb restricted diet  Prophylaxis: Heparin  Disposition: Pending hemodialysis bed  Full code    Consultants:   Nephrology  Procedures:   Hemodialysis  Antimicrobials:  None   Subjective: Through the Arabic interpreter patient reports he is doing well.  He did not know he had a fever yesterday.  He denies any cough, congestion, abdominal pain, chest pain, nausea, vomiting, diarrhea.  Objective: Vitals:   01/23/18 0720 01/23/18 0725 01/23/18 0750 01/23/18 0820  BP: (!) 168/79 (!) 168/81 (!) 165/58 (!) 170/82  Pulse: 83 81 82 80  Resp: 16 17    Temp: 98.6 F (37 C)     TempSrc: Oral     SpO2:      Weight:      Height:        Intake/Output Summary (Last 24 hours) at 01/23/2018 1129 Last data filed at 01/22/2018 2200 Gross per 24 hour  Intake 720 ml  Output 100 ml  Net 620 ml   Filed Weights   01/22/18 0417 01/23/18 0534 01/23/18 0700  Weight: 64.5  kg (142 lb 3.2 oz) 65.3 kg (144 lb) 66 kg (145 lb 8.1 oz)    Examination:  General exam: Appears calm and comfortable  Respiratory system: Clear to auscultation. Respiratory effort normal. Cardiovascular system: Regular rate and rhythm, no  murmurs Gastrointestinal system: Soft, mildly distended, dullness at sides, plus bowel sounds, no rebound or guarding Central nervous system: Alert and oriented. No focal neurological deficits. Extremities: No lower extremity edema Skin: Left upper extremity AV fistula with bruit and thrill, right IJ site is clean dry intact Psychiatry: Judgement and insight appear normal. Mood & affect appropriate.     Data Reviewed: I have personally reviewed following labs and imaging studies  CBC: Recent Labs  Lab 01/19/18 0407 01/20/18 0037 01/21/18 0417 01/22/18 0430 01/23/18 0742  WBC 2.8* 2.4* 3.3* 4.1 3.5*  HGB 8.6* 8.6* 8.9* 9.6* 9.2*  HCT 27.8* 27.5* 28.4* 31.7* 30.3*  MCV 81.3 80.6 79.8 81.7 82.8  PLT 78* 68* 70* 73* 57*   Basic Metabolic Panel: Recent Labs  Lab 01/17/18 1109 01/17/18 1418  01/19/18 0407 01/19/18 2157 01/20/18 0037 01/21/18 0417 01/22/18 0430 01/23/18 0742  NA  --  135   < > 134*  --  133* 133* 138 136  K  --  4.3   < > 4.4  --  5.2* 3.7 4.3 4.5  CL  --  110   < > 108  --  106 99* 101 100*  CO2  --  15*   < > 16*  --  15* 27 28 26   GLUCOSE  --  215*   < > 226* 452* 400* 280* 158* 240*  BUN  --  98*   < > 96*  --  108* 33* 22* 38*  CREATININE  --  4.91*   < > 4.67*  --  5.12* 2.70* 2.44* 3.40*  CALCIUM  --  7.0*   < > 7.3*  --  7.4* 7.5* 7.9* 8.2*  MG 2.6*  --   --   --   --   --   --  1.9  --   PHOS  --  6.8*  --   --   --  6.3*  --  2.6 3.2   < > = values in this interval not displayed.   GFR: Estimated Creatinine Clearance: 18.6 mL/min (A) (by C-G formula based on SCr of 3.4 mg/dL (H)). Liver Function Tests: Recent Labs  Lab 01/17/18 1418 01/18/18 0354 01/20/18 0037 01/22/18 0430 01/23/18 0742  AST  --  31  --   --   --   ALT  --  22  --   --   --   ALKPHOS  --  93  --   --   --   BILITOT  --  0.9  --   --   --   PROT  --  5.6*  --   --   --   ALBUMIN 3.0* 2.8* 3.1* 3.1* 3.0*   No results for input(s): LIPASE, AMYLASE in the last 168  hours. Recent Labs  Lab 01/17/18 0452 01/21/18 0417  AMMONIA 51* 27   Coagulation Profile: Recent Labs  Lab 01/17/18 0142  INR 1.28   Cardiac Enzymes: No results for input(s): CKTOTAL, CKMB, CKMBINDEX, TROPONINI in the last 168 hours. BNP (last 3 results) Recent Labs    09/25/17 1614  PROBNP 380.0*   HbA1C: No results for input(s): HGBA1C in the last 72 hours. CBG: Recent Labs  Lab 01/21/18 2134  01/22/18 0736 01/22/18 1107 01/22/18 1639 01/22/18 2038  GLUCAP 152* 272* 235* 140* 117*   Lipid Profile: No results for input(s): CHOL, HDL, LDLCALC, TRIG, CHOLHDL, LDLDIRECT in the last 72 hours. Thyroid Function Tests: No results for input(s): TSH, T4TOTAL, FREET4, T3FREE, THYROIDAB in the last 72 hours. Anemia Panel: No results for input(s): VITAMINB12, FOLATE, FERRITIN, TIBC, IRON, RETICCTPCT in the last 72 hours. Sepsis Labs: Recent Labs  Lab 01/23/18 0742  PROCALCITON 0.39    No results found for this or any previous visit (from the past 240 hour(s)).       Radiology Studies: No results found.      Scheduled Meds: . amLODipine  10 mg Oral Daily  . calcium acetate  2,001 mg Oral TID WC  . carvedilol  12.5 mg Oral BID WC  . Chlorhexidine Gluconate Cloth  6 each Topical Q0600  . darbepoetin (ARANESP) injection - DIALYSIS  100 mcg Intravenous Q Mon-HD  . feeding supplement (NEPRO CARB STEADY)  237 mL Oral BID BM  . feeding supplement (PRO-STAT SUGAR FREE 64)  30 mL Oral BID  . ferrous gluconate  324 mg Oral Daily  . insulin aspart  0-9 Units Subcutaneous TID WC  . insulin glargine  10 Units Subcutaneous Daily  . lactulose  20 g Oral BID  . multivitamin  1 tablet Oral QHS   Continuous Infusions: . sodium chloride 10 mL/hr at 01/19/18 1204  . anticoagulant sodium citrate       LOS: 7 days    Time spent: [place], MD [facility]  If 7PM-7AM, please contact night-coverage [email or web address] Password [facility] 01/23/2018, 11:29  AM

## 2018-01-23 NOTE — Procedures (Signed)
Patient seen and examined on Hemodialysis. QB 400 mL/ min via R IJ TDC, UF goal 2L.  Doing well.  Treatment adjusted as needed.  Madelon Lips MD [facility] pgr [phone] 11:04 AM

## 2018-01-23 NOTE — Progress Notes (Addendum)
[facility] Progress Note   Assessment/ Plan:   1. 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 Had poor response to high dose IV diuretics.  S/p TDC, HD #1 6/17, HD #2, 6/18, HD #3 6/19, next planned 6/21  CLIP complete.  Will do short rx tomorrow AM and then OK to discharge.  Also has maturing AVF. 2. Acute on chronic diastolic HF-grade 1 diastolic dysfunction 01-74%. Expect to improve with UF.   3. Hypertension/volume -BP fairly controlled on amlodipine and carvedilol. 4. Anemia - HGB 8.4 01/17/18. Fe 80 Tsat 33%. Went to infusion clinic 01/14/18-rec'd Epogen 10,000 units 01/14/18.. Start Aranesp 100 mcg IV q week. 5. Metabolic bone disease - Phos 6.8,  PTH 138 .  On Phoslo 6. Nutrition - Albumin 3.0 Renal/Carb mod diet with fld restrictions. 7. DM-per primary.  8. Pancytopenia-followed by oncology.  9. 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 Non-alcoholic cirrhosis with ascites Ammonia level 51. On Lactulose per primary. Has also thrombocytopenia with cirrhosis 11. Dispo: CLIP complete- OK to d/c tomorrow after HD.  Subjective:    On HD.  Doing well   Objective:   BP (!) 170/82   Pulse 80   Temp 98.6 F (37 C) (Oral)   Resp 17   Ht 5' 5"  (1.651 m)   Wt 66 kg (145 lb 8.1 oz)   SpO2 99%   BMI 24.21 kg/m   Physical Exam: General: Cooperative, NAD, appears well, smiling this AM Heart: S1,S2 RRR. JVD improved Lungs: Bilateral breath sounds, clear today Abdomen: active BS, ascites better Extremities: Bilateral trace pitting LE edema Dialysis Access: Maturing LUA AVF + bruit, R IJ TDC dressing c/d/i   Labs: BMET Recent Labs  Lab 01/17/18 0142 01/17/18 1418 01/18/18 0354 01/19/18 0407 01/19/18 2157 01/20/18 0037 01/21/18 0417 01/22/18 0430  NA  134* 135 136 134*  --  133* 133* 138  K 4.1 4.3 4.4 4.4  --  5.2* 3.7 4.3  CL 109 110 110 108  --  106 99* 101  CO2 15* 15* 17* 16*  --  15* 27 28  GLUCOSE 180* 215* 82 226* 452* 400* 280* 158*  BUN 96* 98* 96* 96*  --  108* 33* 22*  CREATININE 4.72* 4.91* 4.81* 4.67*  --  5.12* 2.70* 2.44*  CALCIUM 6.9* 7.0* 7.0* 7.3*  --  7.4* 7.5* 7.9*  PHOS  --  6.8*  --   --   --  6.3*  --  2.6   CBC Recent Labs  Lab 01/20/18 0037 01/21/18 0417 01/22/18 0430 01/23/18 0742  WBC 2.4* 3.3* 4.1 3.5*  HGB 8.6* 8.9* 9.6* 9.2*  HCT 27.5* 28.4* 31.7* 30.3*  MCV 80.6 79.8 81.7 82.8  PLT 68* 70* 73* 57*    @IMGRELPRIORS @ Medications:    . amLODipine  10 mg Oral Daily  . calcium acetate  2,001 mg Oral TID WC  . carvedilol  12.5 mg Oral BID WC  . Chlorhexidine Gluconate Cloth  6 each Topical Q0600  . darbepoetin (ARANESP) injection - DIALYSIS  100 mcg Intravenous Q Mon-HD  . feeding supplement (NEPRO CARB STEADY)  237 mL Oral BID BM  . feeding supplement (PRO-STAT SUGAR FREE 64)  30 mL Oral BID  . ferrous gluconate  324 mg Oral Daily  . insulin aspart  0-9 Units  Subcutaneous TID WC  . insulin glargine  10 Units Subcutaneous Daily  . lactulose  20 g Oral BID  . multivitamin  1 tablet Oral QHS     Madelon Lips MD 01/23/2018, 11:02 AM

## 2018-01-23 NOTE — Plan of Care (Signed)
°  Problem: Coping: °Goal: Level of anxiety will decrease °Outcome: Progressing °  °

## 2018-01-24 DIAGNOSIS — E1122 Type 2 diabetes mellitus with diabetic chronic kidney disease: Secondary | ICD-10-CM | POA: Diagnosis not present

## 2018-01-24 DIAGNOSIS — N2581 Secondary hyperparathyroidism of renal origin: Secondary | ICD-10-CM | POA: Diagnosis not present

## 2018-01-24 DIAGNOSIS — R0602 Shortness of breath: Secondary | ICD-10-CM | POA: Diagnosis not present

## 2018-01-24 DIAGNOSIS — N2883 Nephroptosis: Secondary | ICD-10-CM | POA: Diagnosis not present

## 2018-01-24 DIAGNOSIS — D61818 Other pancytopenia: Secondary | ICD-10-CM | POA: Diagnosis not present

## 2018-01-24 DIAGNOSIS — R188 Other ascites: Secondary | ICD-10-CM | POA: Diagnosis not present

## 2018-01-24 DIAGNOSIS — I132 Hypertensive heart and chronic kidney disease with heart failure and with stage 5 chronic kidney disease, or end stage renal disease: Secondary | ICD-10-CM | POA: Diagnosis not present

## 2018-01-24 DIAGNOSIS — N186 End stage renal disease: Secondary | ICD-10-CM | POA: Diagnosis not present

## 2018-01-24 DIAGNOSIS — D631 Anemia in chronic kidney disease: Secondary | ICD-10-CM | POA: Diagnosis not present

## 2018-01-24 DIAGNOSIS — N184 Chronic kidney disease, stage 4 (severe): Secondary | ICD-10-CM | POA: Diagnosis not present

## 2018-01-24 DIAGNOSIS — I5033 Acute on chronic diastolic (congestive) heart failure: Secondary | ICD-10-CM | POA: Diagnosis not present

## 2018-01-24 DIAGNOSIS — I130 Hypertensive heart and chronic kidney disease with heart failure and stage 1 through stage 4 chronic kidney disease, or unspecified chronic kidney disease: Secondary | ICD-10-CM | POA: Diagnosis not present

## 2018-01-24 LAB — GLUCOSE, CAPILLARY
GLUCOSE-CAPILLARY: 154 mg/dL — AB (ref 65–99)
Glucose-Capillary: 283 mg/dL — ABNORMAL HIGH (ref 65–99)
Glucose-Capillary: 170 mg/dL — ABNORMAL HIGH (ref 65–99)
Glucose-Capillary: 207 mg/dL — ABNORMAL HIGH (ref 65–99)

## 2018-01-24 LAB — CBC
HCT: 31.3 % — ABNORMAL LOW (ref 39.0–52.0)
Hemoglobin: 9.4 g/dL — ABNORMAL LOW (ref 13.0–17.0)
MCH: 25.5 pg — ABNORMAL LOW (ref 26.0–34.0)
MCHC: 30 g/dL (ref 30.0–36.0)
MCV: 84.8 fL (ref 78.0–100.0)
Platelets: 62 K/uL — ABNORMAL LOW (ref 150–400)
RBC: 3.69 MIL/uL — ABNORMAL LOW (ref 4.22–5.81)
RDW: 18.2 % — ABNORMAL HIGH (ref 11.5–15.5)
WBC: 2.9 10*3/uL — ABNORMAL LOW (ref 4.0–10.5)

## 2018-01-24 LAB — RENAL FUNCTION PANEL
Albumin: 2.8 g/dL — ABNORMAL LOW (ref 3.5–5.0)
Anion gap: 8 (ref 5–15)
BUN: 15 mg/dL (ref 6–20)
CO2: 28 mmol/L (ref 22–32)
Calcium: 8.2 mg/dL — ABNORMAL LOW (ref 8.9–10.3)
Chloride: 101 mmol/L (ref 101–111)
Creatinine, Ser: 2.24 mg/dL — ABNORMAL HIGH (ref 0.61–1.24)
GFR calc Af Amer: 33 mL/min — ABNORMAL LOW (ref >60)
GFR calc non Af Amer: 29 mL/min — ABNORMAL LOW (ref >60)
Glucose, Bld: 234 mg/dL — ABNORMAL HIGH (ref 65–99)
Phosphorus: 2.8 mg/dL (ref 2.5–4.6)
Potassium: 3.6 mmol/L (ref 3.5–5.1)
Sodium: 137 mmol/L (ref 135–145)

## 2018-01-24 LAB — PROCALCITONIN: Procalcitonin: 0.24 ng/mL

## 2018-01-24 MED ORDER — ANTICOAGULANT SODIUM CITRATE 4% (200MG/5ML) IV SOLN
5.0000 mL | Freq: Once | Status: AC
  Administered 2018-01-24: 5 mL via INTRAVENOUS
  Filled 2018-01-24 (×2): qty 5

## 2018-01-24 NOTE — Progress Notes (Signed)
PROGRESS NOTE    William [name]  HBZ:[id number] DOB: 10-15-1950 DOA: 01/16/2018 PCP: Gildardo Pounds, NP   Brief Narrative:  67 year old with past medical history relevant for type 2 diabetes on insulin, hypertension, stage IV chronic kidney disease now on dialysis (prior required acute dialysis) comp gated by hypoproliferative anemia and metabolic bone disease, chronic diastolic heart failure with grade 1 diastolic dysfunction by echo on 10/21/2016, glomerular basement membrane disease with no evidence of renal involvement on biopsy (reportedly biopsy showed only shows FSGS consistent with type 2 diabetes) however IF was not performed, nonalcoholic cirrhosis with ascites who was admitted with shortness of breath and found to be volume overloaded and is currently on dialysis.  Assessment & Plan:   Principal Problem:   SOB (shortness of breath) Active Problems:   Type II diabetes mellitus with renal manifestations (HCC)   Acute on chronic diastolic CHF (congestive heart failure) (HCC)   Pancytopenia (HCC)   Essential hypertension   Cirrhosis of liver with ascites (HCC)   CKD (chronic kidney disease) stage 4, GFR 15-29 ml/min (HCC)   Fluid overload   #) Low-grade fever: Patient continues to have low-grade fevers.  This time it is not clear that there is any infectious source.  His chest x-ray shows only some persistent pulmonary edema and his blood cultures are no growth to date.  With his low procalcitonin no localizing signs or symptoms suspect that there could be another cause of his fever possibly related to his pancytopenia.  Will consider discussion of bone marrow biopsy or evaluation of underlying malignant process if patient continues to have fever without a source.  We will also consider paracentesis to evaluate for SBP. -Blood cultures x2 01/23/2018 no growth to date -Procalcitonin is quite low at 0.24 and does not suggest infection.  #) Shortness of breath due to end-stage renal  disease: Resolved with dialysis -Nephrology consult -Pending outpatient bed for dialysis - Continue calcium acetate 3 times daily  #) Acute on chronic diastolic heart failure exacerbation: Resolved -Discontinue home furosemide  #) Hypoproliferative anemia: Multifactorial and likely related to both anemia of chronic disease and CKD/ESRD. - Continue epo supplementation -Continue oral iron supplementation daily   #) Nonalcoholic cirrhosis: Unclear etiology however this is attributed to metabolic syndrome. -We will manage fluids with dialysis -Continue lactulose  #) Type 2 diabetes: Patient is on 70/30 with 18 units at home. -Continue glargine 10 units nightly -Sliding scale insulin -Renal/carb restricted diet  #) Hypertension: -Continue carvedilol 12.5 mg twice daily - Continue home amlodipine 10 mg daily  #) Anti-glomerular basement membrane disease: This is apparently not a firm diagnosis as his antiglomerular basement membrane antibody was low positive and his ANCA was negative.  He received pulse dose steroids and cyclophosphamide. -Stable  #) Pancytopenia: Reportedly per the chart this is secondary to his Goodpasture's -Stable  Fluids: Restrict Electrodes: Monitor and supplemen  nutrition: Renal/carb restricted diet  Prophylaxis: Heparin  Disposition: Pending hemodialysis bed  Full code    Consultants:   Nephrology  Procedures:   Hemodialysis  Antimicrobials:  None   Subjective: Patient was seen while on hemodialysis.  Does not have any complaints.  Discussed extensively with the son about the fever and how they are concerned about taking him home if he continues to have fevers.  Explained that this was currently being evaluated.  Objective: Vitals:   01/24/18 0900 01/24/18 0930 01/24/18 1000 01/24/18 1027  BP: 127/67 109/60 (!) 88/53 (!) 146/70  Pulse: 79 77 78  75  Resp:    13  Temp:    98.6 F (37 C)  TempSrc:    Oral  SpO2:    97%  Weight:     61.1 kg (134 lb 11.2 oz)  Height:        Intake/Output Summary (Last 24 hours) at 01/24/2018 1222 Last data filed at 01/24/2018 1027 Gross per 24 hour  Intake 360 ml  Output 1680 ml  Net -1320 ml   Filed Weights   01/24/18 0501 01/24/18 0811 01/24/18 1027  Weight: 65.2 kg (143 lb 12.8 oz) 63.1 kg (139 lb 1.8 oz) 61.1 kg (134 lb 11.2 oz)    Examination:  General exam: Appears calm and comfortable  Respiratory system: Clear to auscultation. Respiratory effort normal.  Diminished lung sounds at bases Cardiovascular system: Regular rate and rhythm, no murmurs Gastrointestinal system: Soft, mildly distended, dullness at sides, plus bowel sounds, no rebound or guarding Central nervous system: Alert and oriented. No focal neurological deficits. Extremities: No lower extremity edema Skin: Left upper extremity AV fistula with bruit and thrill, right IJ site is clean dry intact Psychiatry: Judgement and insight appear normal. Mood & affect appropriate.     Data Reviewed: I have personally reviewed following labs and imaging studies  CBC: Recent Labs  Lab 01/20/18 0037 01/21/18 0417 01/22/18 0430 01/23/18 0742 01/24/18 0415  WBC 2.4* 3.3* 4.1 3.5* 2.9*  HGB 8.6* 8.9* 9.6* 9.2* 9.4*  HCT 27.5* 28.4* 31.7* 30.3* 31.3*  MCV 80.6 79.8 81.7 82.8 84.8  PLT 68* 70* 73* 57* 62*   Basic Metabolic Panel: Recent Labs  Lab 01/17/18 1418  01/20/18 0037 01/21/18 0417 01/22/18 0430 01/23/18 0742 01/24/18 0415  NA 135   < > 133* 133* 138 136 137  K 4.3   < > 5.2* 3.7 4.3 4.5 3.6  CL 110   < > 106 99* 101 100* 101  CO2 15*   < > 15* _0 GLUCOSE 215*   < > 400* 280* 158* 240* 234*  BUN 98*   < > 108* 33* 22* 38* 15  CREATININE 4.91*   < > 5.12* 2.70* 2.44* 3.40* 2.24*  CALCIUM 7.0*   < > 7.4* 7.5* 7.9* 8.2* 8.2*  MG  --   --   --   --  1.9  --   --   PHOS 6.8*  --  6.3*  --  2.6 3.2 2.8   < > = values in this interval not displayed.   GFR: Estimated Creatinine Clearance:  28 mL/min (A) (by C-G formula based on SCr of 2.24 mg/dL (H)). Liver Function Tests: Recent Labs  Lab 01/18/18 0354 01/20/18 0037 01/22/18 0430 01/23/18 0742 01/24/18 0415  AST 31  --   --   --   --   ALT 22  --   --   --   --   ALKPHOS 93  --   --   --   --   BILITOT 0.9  --   --   --   --   PROT 5.6*  --   --   --   --   ALBUMIN 2.8* 3.1* 3.1* 3.0* 2.8*   No results for input(s): LIPASE, AMYLASE in the last 168 hours. Recent Labs  Lab 01/21/18 0417  AMMONIA 27   Coagulation Profile: No results for input(s): INR, PROTIME in the last 168 hours. Cardiac Enzymes: No results for input(s): CKTOTAL, CKMB, CKMBINDEX, TROPONINI in the last  168 hours. BNP (last 3 results) Recent Labs    09/25/17 1614  PROBNP 380.0*   HbA1C: No results for input(s): HGBA1C in the last 72 hours. CBG: Recent Labs  Lab 01/22/18 2038 01/23/18 1221 01/23/18 1541 01/23/18 2140 01/24/18 0620  GLUCAP 117* 119* 221* 261* 283*   Lipid Profile: No results for input(s): CHOL, HDL, LDLCALC, TRIG, CHOLHDL, LDLDIRECT in the last 72 hours. Thyroid Function Tests: No results for input(s): TSH, T4TOTAL, FREET4, T3FREE, THYROIDAB in the last 72 hours. Anemia Panel: No results for input(s): VITAMINB12, FOLATE, FERRITIN, TIBC, IRON, RETICCTPCT in the last 72 hours. Sepsis Labs: Recent Labs  Lab 01/23/18 0742 01/24/18 0415  PROCALCITON 0.39 0.24    No results found for this or any previous visit (from the past 240 hour(s)).       Radiology Studies: Dg Chest Port 1 View  Result Date: 01/23/2018 CLINICAL DATA:  Fever EXAM: PORTABLE CHEST 1 VIEW COMPARISON:  January 19, 2018 FINDINGS: Port-A-Cath tip is in the right atrium. No pneumothorax. There is airspace consolidation in both mid and lower lung zones, slightly less than on recent study. No new opacity evident. Heart is mildly enlarged with pulmonary venous hypertension. IMPRESSION: Pulmonary vascular congestion. Evidence of interstitial and patchy  alveolar edema, slightly less than on recent study. The overall appearance is felt to be indicative of congestive heart failure. A degree of superimposed pneumonia in the bases cannot be excluded. Central catheter tip is in the right atrium. No pneumothorax. No evident adenopathy. Electronically Signed   By: Lowella Grip III M.D.   On: 01/23/2018 12:41        Scheduled Meds: . amLODipine  10 mg Oral Daily  . calcium acetate  2,001 mg Oral TID WC  . carvedilol  12.5 mg Oral BID WC  . Chlorhexidine Gluconate Cloth  6 each Topical Q0600  . darbepoetin (ARANESP) injection - DIALYSIS  100 mcg Intravenous Q Mon-HD  . feeding supplement (NEPRO CARB STEADY)  237 mL Oral BID BM  . feeding supplement (PRO-STAT SUGAR FREE 64)  30 mL Oral BID  . ferrous gluconate  324 mg Oral Daily  . insulin aspart  0-9 Units Subcutaneous TID WC  . insulin glargine  10 Units Subcutaneous Daily  . lactulose  20 g Oral BID  . multivitamin  1 tablet Oral QHS   Continuous Infusions: . sodium chloride 10 mL/hr at 01/19/18 1204  . anticoagulant sodium citrate       LOS: 8 days    Time spent: Lewis, MD [facility]  If 7PM-7AM, please contact night-coverage [email or web address] Password [facility] 01/24/2018, 12:22 PM

## 2018-01-24 NOTE — Plan of Care (Signed)
  Problem: Education: Goal: Knowledge of General Education information will improve Outcome: Progressing   

## 2018-01-24 NOTE — Procedures (Signed)
Patient seen and examined on Hemodialysis. QB 400 mL/ min via R IJ TDC, UF goal 1L.  Doing well, no complaints.  Treatment adjusted as needed.  Madelon Lips MD [facility] pgr [phone] 9:05 AM

## 2018-01-24 NOTE — Progress Notes (Signed)
[facility] Progress Note   Assessment/ Plan:   1. 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 Had poor response to high dose IV diuretics.  S/p TDC, HD #1 6/17, HD #2, 6/18, HD #3 6/19, next planned 6/21  CLIP complete.  Will do short rx tomorrow AM and then OK to discharge.  Also has maturing AVF. 2. New fever 6/21: low-grade, Tmax 100.5.  CXR with some persistent pulm edema, blood cultures pending.  [place] placed 6/17.  No hard trigger for abx yet.     3. Acute on chronic diastolic HF-grade 1 diastolic dysfunction 65-78%. Expect to improve with UF.   4. Hypertension/volume -BP fairly controlled on amlodipine and carvedilol. 5. Anemia - HGB 8.4 01/17/18. Fe 80 Tsat 33%. Went to infusion clinic 01/14/18-rec'd Epogen 10,000 units 01/14/18.. Start Aranesp 100 mcg IV q week. 6. Metabolic bone disease - Phos 6.8,  PTH 138 .  On Phoslo 7. Nutrition - Albumin 3.0 Renal/Carb mod diet with fld restrictions. 8. DM-per primary.  9. Pancytopenia-followed by oncology.  10. 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 Non-alcoholic cirrhosis with ascites Ammonia level 51. On Lactulose per primary. Has also thrombocytopenia with cirrhosis 12. Dispo: CLIP complete.  In setting of new fever and new catheter may be better to make sure no bacteremia before discharge?  Subjective:     Low-grade fever yesterday, CXR done and blood cultures drawn.  Pt denies complaints.     Objective:   BP (!) 151/72 (BP Location: Right Arm)   Pulse 87   Temp 99 F (37.2 C) (Oral)   Resp 16   Ht 5' 5"  (1.651 m)   Wt 65.2 kg (143 lb 12.8 oz)   SpO2 97%   BMI 23.93 kg/m   Physical Exam: General: Cooperative, NAD, appears well, smiling this AM Heart: S1,S2 RRR. JVD improved Lungs: Bilateral breath  sounds, clear today Abdomen: active BS, ascites better Extremities: Bilateral trace pitting LE edema Dialysis Access: Maturing LUA AVF + bruit, R IJ TDC dressing c/d/i, no drainage   Labs: BMET Recent Labs  Lab 01/17/18 1418 01/18/18 0354 01/19/18 0407 01/19/18 2157 01/20/18 0037 01/21/18 0417 01/22/18 0430 01/23/18 0742 01/24/18 0415  NA 135 136 134*  --  133* 133* 138 136 137  K 4.3 4.4 4.4  --  5.2* 3.7 4.3 4.5 3.6  CL 110 110 108  --  106 99* 101 100* 101  CO2 15* 17* 16*  --  15* 27 28 26 28   GLUCOSE 215* 82 226* 452* 400* 280* 158* 240* 234*  BUN 98* 96* 96*  --  108* 33* 22* 38* 15  CREATININE 4.91* 4.81* 4.67*  --  5.12* 2.70* 2.44* 3.40* 2.24*  CALCIUM 7.0* 7.0* 7.3*  --  7.4* 7.5* 7.9* 8.2* 8.2*  PHOS 6.8*  --   --   --  6.3*  --  2.6 3.2 2.8   CBC Recent Labs  Lab 01/21/18 0417 01/22/18 0430 01/23/18 0742 01/24/18 0415  WBC 3.3* 4.1 3.5* 2.9*  HGB 8.9* 9.6* 9.2* 9.4*  HCT 28.4* 31.7* 30.3* 31.3*  MCV 79.8 81.7 82.8 84.8  PLT 70* 73* 57* 62*    @IMGRELPRIORS @ Medications:    . amLODipine  10 mg Oral Daily  . calcium acetate  2,001 mg Oral TID WC  . carvedilol  12.5 mg Oral BID WC  . Chlorhexidine Gluconate Cloth  6 each Topical Q0600  . darbepoetin (ARANESP) injection - DIALYSIS  100 mcg Intravenous Q Mon-HD  . feeding supplement (NEPRO CARB STEADY)  237 mL Oral BID BM  . feeding supplement (PRO-STAT SUGAR FREE 64)  30 mL Oral BID  . ferrous gluconate  324 mg Oral Daily  . insulin aspart  0-9 Units Subcutaneous TID WC  . insulin glargine  10 Units Subcutaneous Daily  . lactulose  20 g Oral BID  . multivitamin  1 tablet Oral QHS     Madelon Lips MD 01/24/2018, 9:00 AM

## 2018-01-25 DIAGNOSIS — I130 Hypertensive heart and chronic kidney disease with heart failure and stage 1 through stage 4 chronic kidney disease, or unspecified chronic kidney disease: Secondary | ICD-10-CM | POA: Diagnosis not present

## 2018-01-25 DIAGNOSIS — N2581 Secondary hyperparathyroidism of renal origin: Secondary | ICD-10-CM | POA: Diagnosis not present

## 2018-01-25 DIAGNOSIS — I5033 Acute on chronic diastolic (congestive) heart failure: Secondary | ICD-10-CM | POA: Diagnosis not present

## 2018-01-25 DIAGNOSIS — N2883 Nephroptosis: Secondary | ICD-10-CM | POA: Diagnosis not present

## 2018-01-25 DIAGNOSIS — R0602 Shortness of breath: Secondary | ICD-10-CM | POA: Diagnosis not present

## 2018-01-25 DIAGNOSIS — N186 End stage renal disease: Secondary | ICD-10-CM | POA: Diagnosis not present

## 2018-01-25 DIAGNOSIS — R188 Other ascites: Secondary | ICD-10-CM | POA: Diagnosis not present

## 2018-01-25 DIAGNOSIS — N184 Chronic kidney disease, stage 4 (severe): Secondary | ICD-10-CM | POA: Diagnosis not present

## 2018-01-25 DIAGNOSIS — D631 Anemia in chronic kidney disease: Secondary | ICD-10-CM | POA: Diagnosis not present

## 2018-01-25 DIAGNOSIS — I132 Hypertensive heart and chronic kidney disease with heart failure and with stage 5 chronic kidney disease, or end stage renal disease: Secondary | ICD-10-CM | POA: Diagnosis not present

## 2018-01-25 DIAGNOSIS — D61818 Other pancytopenia: Secondary | ICD-10-CM | POA: Diagnosis not present

## 2018-01-25 DIAGNOSIS — E1122 Type 2 diabetes mellitus with diabetic chronic kidney disease: Secondary | ICD-10-CM | POA: Diagnosis not present

## 2018-01-25 LAB — MAGNESIUM: Magnesium: 1.9 mg/dL (ref 1.7–2.4)

## 2018-01-25 LAB — PROCALCITONIN: Procalcitonin: 0.16 ng/mL

## 2018-01-25 LAB — CBC WITH DIFFERENTIAL/PLATELET
Abs Immature Granulocytes: 0 K/uL (ref 0.0–0.1)
Basophils Absolute: 0 K/uL (ref 0.0–0.1)
Basophils Relative: 0 %
Eosinophils Absolute: 0.1 10*3/uL (ref 0.0–0.7)
Eosinophils Relative: 2 %
HCT: 32 % — ABNORMAL LOW (ref 39.0–52.0)
Hemoglobin: 9.7 g/dL — ABNORMAL LOW (ref 13.0–17.0)
Immature Granulocytes: 0 %
Lymphocytes Relative: 30 %
Lymphs Abs: 0.9 10*3/uL (ref 0.7–4.0)
MCH: 25.1 pg — ABNORMAL LOW (ref 26.0–34.0)
MCHC: 30.3 g/dL (ref 30.0–36.0)
MCV: 82.7 fL (ref 78.0–100.0)
Monocytes Absolute: 0.6 K/uL (ref 0.1–1.0)
Monocytes Relative: 20 %
Neutro Abs: 1.4 K/uL — ABNORMAL LOW (ref 1.7–7.7)
Neutrophils Relative %: 48 %
Platelets: 48 10*3/uL — ABNORMAL LOW (ref 150–400)
RBC: 3.87 MIL/uL — ABNORMAL LOW (ref 4.22–5.81)
RDW: 17.4 % — ABNORMAL HIGH (ref 11.5–15.5)
WBC: 2.9 10*3/uL — ABNORMAL LOW (ref 4.0–10.5)

## 2018-01-25 LAB — COMPREHENSIVE METABOLIC PANEL
AST: 34 U/L (ref 15–41)
Albumin: 3 g/dL — ABNORMAL LOW (ref 3.5–5.0)
Anion gap: 8 (ref 5–15)
BUN: 16 mg/dL (ref 6–20)
CO2: 28 mmol/L (ref 22–32)
Chloride: 101 mmol/L (ref 101–111)
Glucose, Bld: 201 mg/dL — ABNORMAL HIGH (ref 65–99)
Potassium: 4 mmol/L (ref 3.5–5.1)

## 2018-01-25 LAB — COMPREHENSIVE METABOLIC PANEL WITH GFR
ALT: 15 U/L — ABNORMAL LOW (ref 17–63)
Alkaline Phosphatase: 82 U/L (ref 38–126)
Calcium: 8.4 mg/dL — ABNORMAL LOW (ref 8.9–10.3)
Creatinine, Ser: 2.52 mg/dL — ABNORMAL HIGH (ref 0.61–1.24)
GFR calc Af Amer: 29 mL/min — ABNORMAL LOW (ref >60)
GFR calc non Af Amer: 25 mL/min — ABNORMAL LOW (ref >60)
Sodium: 137 mmol/L (ref 135–145)
Total Bilirubin: 1.7 mg/dL — ABNORMAL HIGH (ref 0.3–1.2)
Total Protein: 5.7 g/dL — ABNORMAL LOW (ref 6.5–8.1)

## 2018-01-25 LAB — GLUCOSE, CAPILLARY
GLUCOSE-CAPILLARY: 192 mg/dL — AB (ref 65–99)
GLUCOSE-CAPILLARY: 281 mg/dL — AB (ref 65–99)
Glucose-Capillary: 237 mg/dL — ABNORMAL HIGH (ref 65–99)
Glucose-Capillary: 178 mg/dL — ABNORMAL HIGH (ref 65–99)

## 2018-01-25 NOTE — Progress Notes (Signed)
[facility] Progress Note   Assessment/ Plan:   1. 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 Had poor response to high dose IV diuretics.  S/p TDC, HD #1 6/17, HD #2, 6/18, HD #3 6/19, 6/21, short rx 6/22.  Next planned for 6/25.  CLIP complete--> to AF TTS 2nd shift.  New fever 6/21: low-grade, Tmax 100.5.  CXR with some persistent pulm edema, blood cultures negative.  [place] placed 6/17.  May have to consider removing and replacing TDC if fevers persist.   2. Acute on chronic diastolic HF-grade 1 diastolic dysfunction 42-35%. Expect to improve with UF.   3. Hypertension/volume -BP fairly controlled on amlodipine and carvedilol. 4. Anemia: Fe 80 Tsat 33%. Started Aranesp 100 mcg IV q week. 5. Metabolic bone disease - Phos 6.8,  PTH 138 .  On Phoslo 6. Nutrition - Albumin 3.0 Renal/Carb mod diet with fld restrictions. 7. DM-per primary.  8. Pancytopenia-followed by oncology.  9. 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 Non-alcoholic cirrhosis with ascites Ammonia level 51. On Lactulose per primary. Has also thrombocytopenia with cirrhosis 11. Dispo: CLIP complete. Pending fever workup  Subjective:     Still having low-grade fevers    Objective:   BP (!) 159/67 (BP Location: Right Arm)   Pulse 79   Temp 99.7 F (37.6 C) (Oral)   Resp 16   Ht 5' 5"  (1.651 m)   Wt 61.2 kg (134 lb 14.4 oz)   SpO2 100%   BMI 22.45 kg/m   Physical Exam: General: Cooperative, NAD, lying in bed Heart: S1,S2 RRR. JVD improved Lungs: Bilateral breath sounds, clear today Abdomen: active BS, ascites better Extremities: Bilateral trace pitting LE edema Dialysis Access: Maturing LUA AVF + bruit, R IJ TDC dressing c/d/i, no drainage or erythema   Labs: BMET Recent Labs  Lab  01/19/18 0407 01/19/18 2157 01/20/18 0037 01/21/18 0417 01/22/18 0430 01/23/18 0742 01/24/18 0415 01/25/18 0756  NA 134*  --  133* 133* 138 136 137 137  K 4.4  --  5.2* 3.7 4.3 4.5 3.6 4.0  CL 108  --  106 99* 101 100* 101 101  CO2 16*  --  15* 27 28 26 28 28   GLUCOSE 226* 452* 400* 280* 158* 240* 234* 201*  BUN 96*  --  108* 33* 22* 38* 15 16  CREATININE 4.67*  --  5.12* 2.70* 2.44* 3.40* 2.24* 2.52*  CALCIUM 7.3*  --  7.4* 7.5* 7.9* 8.2* 8.2* 8.4*  PHOS  --   --  6.3*  --  2.6 3.2 2.8  --    CBC Recent Labs  Lab 01/22/18 0430 01/23/18 0742 01/24/18 0415 01/25/18 0756  WBC 4.1 3.5* 2.9* 2.9*  NEUTROABS  --   --   --  1.4*  HGB 9.6* 9.2* 9.4* 9.7*  HCT 31.7* 30.3* 31.3* 32.0*  MCV 81.7 82.8 84.8 82.7  PLT 73* 57* 62* 48*    @IMGRELPRIORS @ Medications:    . amLODipine  10 mg Oral Daily  . calcium acetate  2,001 mg Oral TID WC  . carvedilol  12.5 mg Oral BID WC  . Chlorhexidine Gluconate Cloth  6 each Topical Q0600  . darbepoetin (ARANESP) injection - DIALYSIS  100 mcg Intravenous Q Mon-HD  . feeding supplement (NEPRO CARB STEADY)  237 mL Oral BID  BM  . feeding supplement (PRO-STAT SUGAR FREE 64)  30 mL Oral BID  . ferrous gluconate  324 mg Oral Daily  . insulin aspart  0-9 Units Subcutaneous TID WC  . insulin glargine  10 Units Subcutaneous Daily  . lactulose  20 g Oral BID  . multivitamin  1 tablet Oral QHS     Madelon Lips MD 01/25/2018, 3:12 PM

## 2018-01-25 NOTE — Progress Notes (Signed)
PROGRESS NOTE    William [name]  QHU:[id number] DOB: Nov 21, 1950 DOA: 01/16/2018 PCP: Gildardo Pounds, NP   Brief Narrative:  67 year old with past medical history relevant for type 2 diabetes on insulin, hypertension, stage IV chronic kidney disease now on dialysis (prior required acute dialysis) comp gated by hypoproliferative anemia and metabolic bone disease, chronic diastolic heart failure with grade 1 diastolic dysfunction by echo on 10/21/2016, glomerular basement membrane disease with no evidence of renal involvement on biopsy (reportedly biopsy showed only shows FSGS consistent with type 2 diabetes) however IF was not performed, nonalcoholic cirrhosis with ascites who was admitted with shortness of breath and found to be volume overloaded and is currently on dialysis.  Assessment & Plan:   Principal Problem:   SOB (shortness of breath) Active Problems:   Type II diabetes mellitus with renal manifestations (HCC)   Acute on chronic diastolic CHF (congestive heart failure) (HCC)   Pancytopenia (HCC)   Essential hypertension   Cirrhosis of liver with ascites (HCC)   CKD (chronic kidney disease) stage 4, GFR 15-29 ml/min (HCC)   Fluid overload   #) Low-grade fever: Patient continues to have low-grade fevers however no evidence of infection on exam.  His lab work is extremely reassuring and procalcitonin on recheck is still quite low. -Blood cultures x2 01/23/2018 no growth to date -Continue to monitor -We will consider possible drug fever though this is not felt to be likely as he is not a common cause of that  #) Shortness of breath due to end-stage renal disease: Resolved with dialysis -Nephrology consult -Pending outpatient bed for dialysis - Continue calcium acetate 3 times daily  #) Pancytopenia: Reportedly per the chart this is secondary to his Goodpasture's.  Is unclear if patient has received a bone marrow biopsy however with these low-grade fevers query whether a  paraneoplastic process could be ongoing. -Stable  #) Acute on chronic diastolic heart failure exacerbation: Resolved -Discontinue home furosemide  #) Hypoproliferative anemia: Multifactorial and likely related to both anemia of chronic disease and CKD/ESRD. - Continue epo supplementation -Continue oral iron supplementation daily   #) Nonalcoholic cirrhosis: Unclear etiology however this is attributed to metabolic syndrome. -We will manage fluids with dialysis -Continue lactulose  #) Type 2 diabetes: Patient is on 70/30 with 18 units at home. -Continue glargine 10 units nightly -Sliding scale insulin -Renal/carb restricted diet  #) Hypertension: -Continue carvedilol 12.5 mg twice daily - Continue home amlodipine 10 mg daily  #) Anti-glomerular basement membrane disease: This is apparently not a firm diagnosis as his antiglomerular basement membrane antibody was low positive and his ANCA was negative.  He received pulse dose steroids and cyclophosphamide. -Stable  Fluids: Restrict Electrodes: Monitor and supplemen  nutrition: Renal/carb restricted diet  Prophylaxis: Heparin  Disposition: Pending hemodialysis bed  Full code    Consultants:   Nephrology  Procedures:   Hemodialysis  Antimicrobials:  None   Subjective: Patient was seen while on hemodialysis.  Does not have any complaints..  Objective: Vitals:   01/24/18 1638 01/24/18 2145 01/25/18 0648 01/25/18 0816  BP:  (!) 128/41 (!) 151/61 (!) 159/67  Pulse:  78 79   Resp:  16 16   Temp: 98.7 F (37.1 C) 99.3 F (37.4 C) (!) 100.4 F (38 C) 99.7 F (37.6 C)  TempSrc: Oral Oral Oral Oral  SpO2:  93% 100%   Weight:   61.2 kg (134 lb 14.4 oz)   Height:        Intake/Output Summary (  Last 24 hours) at 01/25/2018 1121 Last data filed at 01/24/2018 1818 Gross per 24 hour  Intake 485 ml  Output 800 ml  Net -315 ml   Filed Weights   01/24/18 0811 01/24/18 1027 01/25/18 0648  Weight: 63.1 kg (139 lb  1.8 oz) 61.1 kg (134 lb 11.2 oz) 61.2 kg (134 lb 14.4 oz)    Examination:  General exam: Appears calm and comfortable  Respiratory system: Clear to auscultation. Respiratory effort normal.  Diminished lung sounds at bases Cardiovascular system: Regular rate and rhythm, no murmurs Gastrointestinal system: Soft, mildly distended, dullness at sides, plus bowel sounds, no rebound or guarding Central nervous system: Alert and oriented. No focal neurological deficits. Extremities: No lower extremity edema Skin: Left upper extremity AV fistula with bruit and thrill, right IJ site is clean dry intact Psychiatry: Judgement and insight appear normal. Mood & affect appropriate.     Data Reviewed: I have personally reviewed following labs and imaging studies  CBC: Recent Labs  Lab 01/21/18 0417 01/22/18 0430 01/23/18 0742 01/24/18 0415 01/25/18 0756  WBC 3.3* 4.1 3.5* 2.9* 2.9*  NEUTROABS  --   --   --   --  1.4*  HGB 8.9* 9.6* 9.2* 9.4* 9.7*  HCT 28.4* 31.7* 30.3* 31.3* 32.0*  MCV 79.8 81.7 82.8 84.8 82.7  PLT 70* 73* 57* 62* 48*   Basic Metabolic Panel: Recent Labs  Lab 01/20/18 0037 01/21/18 0417 01/22/18 0430 01/23/18 0742 01/24/18 0415 01/25/18 0756  NA 133* 133* 138 136 137 137  K 5.2* 3.7 4.3 4.5 3.6 4.0  CL 106 99* 101 100* 101 101  CO2 15* 27 28 26 28 28   GLUCOSE 400* 280* 158* 240* 234* 201*  BUN 108* 33* 22* 38* 15 16  CREATININE 5.12* 2.70* 2.44* 3.40* 2.24* 2.52*  CALCIUM 7.4* 7.5* 7.9* 8.2* 8.2* 8.4*  MG  --   --  1.9  --   --  1.9  PHOS 6.3*  --  2.6 3.2 2.8  --    GFR: Estimated Creatinine Clearance: 25 mL/min (A) (by C-G formula based on SCr of 2.52 mg/dL (H)). Liver Function Tests: Recent Labs  Lab 01/20/18 0037 01/22/18 0430 01/23/18 0742 01/24/18 0415 01/25/18 0756  AST  --   --   --   --  34  ALT  --   --   --   --  15*  ALKPHOS  --   --   --   --  82  BILITOT  --   --   --   --  1.7*  PROT  --   --   --   --  5.7*  ALBUMIN 3.1* 3.1* 3.0*  2.8* 3.0*   No results for input(s): LIPASE, AMYLASE in the last 168 hours. Recent Labs  Lab 01/21/18 0417  AMMONIA 27   Coagulation Profile: No results for input(s): INR, PROTIME in the last 168 hours. Cardiac Enzymes: No results for input(s): CKTOTAL, CKMB, CKMBINDEX, TROPONINI in the last 168 hours. BNP (last 3 results) Recent Labs    09/25/17 1614  PROBNP 380.0*   HbA1C: No results for input(s): HGBA1C in the last 72 hours. CBG: Recent Labs  Lab 01/24/18 0620 01/24/18 1335 01/24/18 1638 01/24/18 2147 01/25/18 0727  GLUCAP 283* 170* 154* 207* 192*   Lipid Profile: No results for input(s): CHOL, HDL, LDLCALC, TRIG, CHOLHDL, LDLDIRECT in the last 72 hours. Thyroid Function Tests: No results for input(s): TSH, T4TOTAL, FREET4, T3FREE, THYROIDAB in the last  72 hours. Anemia Panel: No results for input(s): VITAMINB12, FOLATE, FERRITIN, TIBC, IRON, RETICCTPCT in the last 72 hours. Sepsis Labs: Recent Labs  Lab 01/23/18 0742 01/24/18 0415 01/25/18 0756  PROCALCITON 0.39 0.24 0.16    Recent Results (from the past 240 hour(s))  Culture, blood (routine x 2)     Status: None (Preliminary result)   Collection Time: 01/23/18  2:01 PM  Result Value Ref Range Status   Specimen Description BLOOD RIGHT ANTECUBITAL  Final   Special Requests   Final    BOTTLES DRAWN AEROBIC AND ANAEROBIC Blood Culture adequate volume   Culture   Final    NO GROWTH 1 DAY Performed at [facility], [place] [address], [place], [place] [address]    Report Status PENDING  Incomplete  Culture, blood (routine x 2)     Status: None (Preliminary result)   Collection Time: 01/23/18  2:11 PM  Result Value Ref Range Status   Specimen Description BLOOD RIGHT HAND  Final   Special Requests   Final    BOTTLES DRAWN AEROBIC AND ANAEROBIC Blood Culture adequate volume   Culture   Final    NO GROWTH 1 DAY Performed at [facility], [place] [address] [place],  [address]    Report Status  PENDING  Incomplete         Radiology Studies: Dg Chest Port 1 View  Result Date: 01/23/2018 CLINICAL DATA:  Fever EXAM: PORTABLE CHEST 1 VIEW COMPARISON:  January 19, 2018 FINDINGS: Port-A-Cath tip is in the right atrium. No pneumothorax. There is airspace consolidation in both mid and lower lung zones, slightly less than on recent study. No new opacity evident. Heart is mildly enlarged with pulmonary venous hypertension. IMPRESSION: Pulmonary vascular congestion. Evidence of interstitial and patchy alveolar edema, slightly less than on recent study. The overall appearance is felt to be indicative of congestive heart failure. A degree of superimposed pneumonia in the bases cannot be excluded. Central catheter tip is in the right atrium. No pneumothorax. No evident adenopathy. Electronically Signed   By: Lowella Grip III M.D.   On: 01/23/2018 12:41        Scheduled Meds: . amLODipine  10 mg Oral Daily  . calcium acetate  2,001 mg Oral TID WC  . carvedilol  12.5 mg Oral BID WC  . Chlorhexidine Gluconate Cloth  6 each Topical Q0600  . darbepoetin (ARANESP) injection - DIALYSIS  100 mcg Intravenous Q Mon-HD  . feeding supplement (NEPRO CARB STEADY)  237 mL Oral BID BM  . feeding supplement (PRO-STAT SUGAR FREE 64)  30 mL Oral BID  . ferrous gluconate  324 mg Oral Daily  . insulin aspart  0-9 Units Subcutaneous TID WC  . insulin glargine  10 Units Subcutaneous Daily  . lactulose  20 g Oral BID  . multivitamin  1 tablet Oral QHS   Continuous Infusions: . sodium chloride 10 mL/hr at 01/19/18 1204  . anticoagulant sodium citrate       LOS: 9 days    Time spent: [place], MD [facility]  If 7PM-7AM, please contact night-coverage [email or web address] Password [facility] 01/25/2018, 11:21 AM

## 2018-01-26 DIAGNOSIS — R0602 Shortness of breath: Secondary | ICD-10-CM | POA: Diagnosis not present

## 2018-01-26 DIAGNOSIS — N186 End stage renal disease: Secondary | ICD-10-CM | POA: Diagnosis not present

## 2018-01-26 DIAGNOSIS — R188 Other ascites: Secondary | ICD-10-CM | POA: Diagnosis not present

## 2018-01-26 DIAGNOSIS — N2581 Secondary hyperparathyroidism of renal origin: Secondary | ICD-10-CM | POA: Diagnosis not present

## 2018-01-26 DIAGNOSIS — I5033 Acute on chronic diastolic (congestive) heart failure: Secondary | ICD-10-CM | POA: Diagnosis not present

## 2018-01-26 DIAGNOSIS — I132 Hypertensive heart and chronic kidney disease with heart failure and with stage 5 chronic kidney disease, or end stage renal disease: Secondary | ICD-10-CM | POA: Diagnosis not present

## 2018-01-26 DIAGNOSIS — D61818 Other pancytopenia: Secondary | ICD-10-CM | POA: Diagnosis not present

## 2018-01-26 LAB — CBC
HCT: 32.7 % — ABNORMAL LOW (ref 39.0–52.0)
Hemoglobin: 10 g/dL — ABNORMAL LOW (ref 13.0–17.0)
MCH: 24.6 pg — ABNORMAL LOW (ref 26.0–34.0)
MCHC: 30.6 g/dL (ref 30.0–36.0)
MCV: 80.5 fL (ref 78.0–100.0)
Platelets: 42 10*3/uL — ABNORMAL LOW (ref 150–400)
RBC: 4.06 MIL/uL — ABNORMAL LOW (ref 4.22–5.81)
RDW: 16.9 % — ABNORMAL HIGH (ref 11.5–15.5)
WBC: 3.1 K/uL — ABNORMAL LOW (ref 4.0–10.5)

## 2018-01-26 LAB — RENAL FUNCTION PANEL
Albumin: 3 g/dL — ABNORMAL LOW (ref 3.5–5.0)
Anion gap: 11 (ref 5–15)
BUN: 29 mg/dL — ABNORMAL HIGH (ref 6–20)
CO2: 26 mmol/L (ref 22–32)
Calcium: 8.9 mg/dL (ref 8.9–10.3)
Chloride: 98 mmol/L — ABNORMAL LOW (ref 101–111)
Creatinine, Ser: 3.11 mg/dL — ABNORMAL HIGH (ref 0.61–1.24)
GFR calc Af Amer: 22 mL/min — ABNORMAL LOW (ref >60)
GFR calc non Af Amer: 19 mL/min — ABNORMAL LOW (ref >60)
Glucose, Bld: 149 mg/dL — ABNORMAL HIGH (ref 65–99)
Phosphorus: 3.5 mg/dL (ref 2.5–4.6)
Potassium: 3.9 mmol/L (ref 3.5–5.1)
Sodium: 135 mmol/L (ref 135–145)

## 2018-01-26 LAB — GLUCOSE, CAPILLARY: GLUCOSE-CAPILLARY: 225 mg/dL — AB (ref 65–99)

## 2018-01-26 MED ORDER — LACTULOSE 10 GM/15ML PO SOLN: 20.0000 g | Freq: Two times a day (BID) | ORAL | 1 refills | Status: DC

## 2018-01-26 MED ORDER — CALCIUM ACETATE (PHOS BINDER) 667 MG PO CAPS: 1334.0000 mg | ORAL_CAPSULE | Freq: Three times a day (TID) | ORAL | 1 refills | Status: DC

## 2018-01-26 NOTE — Progress Notes (Signed)
Discharge instruction was given to pt and his son. Belongings were sent home with the son.  Idolina Primer, RN

## 2018-01-26 NOTE — Discharge Instructions (Signed)
Dialysis Dialysis is a procedure that replaces some of the work healthy kidneys do. It is done when you lose about 85-90% of your kidney function. It may also be done earlier if your symptoms may be improved by dialysis. During dialysis, wastes, salt, and extra water are removed from the blood, and the levels of certain chemicals in the blood (such as potassium) are maintained. Dialysis is done in sessions. Dialysis sessions are continued until the kidneys get better. If the kidneys cannot get better, such as in end-stage kidney disease, dialysis is continued for life or until you receive a new kidney (kidney transplant). There are two types of dialysis: hemodialysis and peritoneal dialysis. What is hemodialysis? Hemodialysis is a type of dialysis in which a machine called a dialyzer is used to filter the blood. Before beginning hemodialysis, you will have surgery to create a site where blood can be removed from the body and returned to the body (vascular access). There are three types of vascular accesses:  Arteriovenous fistula. To create this type of access, an artery is connected to a vein (usually in the arm). A fistula takes 1-6 months to develop after surgery. If it develops properly, it usually lasts longer than the other types of vascular accesses. It is also less likely to become infected and cause blood clots.  Arteriovenous graft. To create this type of access, an artery and a vein in the arm are connected with a tube. A graft may be used within 2-3 weeks of surgery.  A venous catheter. To create this type of access, a thin, flexible tube (catheter) is placed in a large vein in your neck, chest, or groin. A catheter may be used right away. It is usually used as a temporary access when dialysis needs to begin immediately.  During hemodialysis, blood leaves the body through your access. It travels through a tube to the dialyzer, where it is filtered. The blood then returns to your body through  another tube. Hemodialysis is usually performed by a health care provider at a hospital or dialysis center three times a week. Visits last about 3-4 hours. It may also be performed with the help of another person at home with training. What is peritoneal dialysis? Peritoneal dialysis is a type of dialysis in which the thin lining of the abdomen (peritoneum) is used as a filter. Before beginning peritoneal dialysis, you will have surgery to place a catheter in your abdomen. The catheter will be used to transfer a fluid called dialysate to and from your abdomen. At the start of a session, your abdomen is filled with dialysate. During the session, wastes, salt, and extra water in the blood pass through the peritoneum and into the dialysate. The dialysate is drained from the body at the end of the session. The process of filling and draining the dialysate is called an exchange. Exchanges are repeated until you have used up all the dialysate for the day. Peritoneal dialysis may be performed by you at home or at almost any other location. It is done every day. You may need up to five exchanges a day. The amount of time the dialysate is in your body between exchanges is called a dwell. The dwell depends on the number of exchanges needed and the characteristics of the peritoneum. It usually varies from 1.5-3 hours. You may go about your day normally between exchanges. Alternately, the exchanges may be done at night while you sleep, using a machine called a cycler. Which type of  dialysis should I choose? Both hemodialysis and peritoneal dialysis have advantages and disadvantages. Talk to your health care provider about which type of dialysis would be best for you. Your lifestyle and preferences should be considered along with your medical condition. In some cases, only one type of dialysis may be an option. Advantages of hemodialysis  It is done less often than peritoneal dialysis.  Someone else can do the  dialysis for you.  If you go to a dialysis center, your health care provider will be able to recognize any problems right away.  If you go to a dialysis center, you can interact with others who are having dialysis. This can provide you with emotional support.  Disadvantages of hemodialysis  Hemodialysis may cause cramps and low blood pressure. It may leave you feeling tired on the days you have the treatment.  If you go to a dialysis center, you will need to make weekly appointments and work around the centers schedule.  You will need to take extra care when traveling. If you go to a dialysis center, you will need to make special arrangements to visit a dialysis center near your destination. If you are having treatments at home, you will need to take the dialyzer with you to your destination.  You will need to avoid more foods than you would need to avoid on peritoneal dialysis.  Advantages of peritoneal dialysis  It is less likely than hemodialysis to cause cramps and low blood pressure.  You may do exchanges on your own wherever you are, including when you travel.  You do not need to avoid as many foods as you do on hemodialysis.  Disadvantages of peritoneal dialysis  It is done more often than hemodialysis.  Performing peritoneal dialysis requires you to have dexterity of the hands. You must also be able to lift bags.  You will have to learn sterilization techniques. You will need to practice them every day to reduce the risk of infection.  What changes will I need to make to my diet during dialysis? Both hemodialysis and peritoneal dialysis require you to make some changes to your diet. For example, you will need to limit your intake of foods high in the minerals phosphorus and potassium. You will also need to limit your fluid intake. Your dietitian can help you plan meals. A good meal plan can improve your dialysis and your health. What should I expect when beginning  dialysis? Adjusting to the dialysis treatment, schedule, and diet can take some time. You may need to stop working and may not be able to do some of the things you normally do. You may feel anxious or depressed when beginning dialysis. Eventually, many people feel better overall because of dialysis. Some people are able to return to work after making some changes, such as reducing work intensity. Where can I find more information?  [place]: www.kidney.org  American Association of Kidney Patients: BombTimer.gl  [employer]: www.kidneyfund.org This information is not intended to replace advice given to you by your health care provider. Make sure you discuss any questions you have with your health care provider. Document Released: 10/12/2002 Document Revised: 12/28/2015 Document Reviewed: 09/15/2012 Elsevier Interactive Patient Education  2017 [employer].

## 2018-01-26 NOTE — Discharge Summary (Signed)
Physician Discharge Summary  William [name] QJF:[id number] DOB: 15-May-1951 DOA: 01/16/2018  PCP: Gildardo Pounds, NP  Admit date: 01/16/2018 Discharge date: 01/26/2018  Admitted From: Home Disposition: Home  Recommendations for Outpatient Follow-up:  1. Follow up with PCP in 1-2 weeks 2. Please obtain BMP/CBC in one week   Home Health: No Equipment/Devices:Dialysis catheter  Discharge Condition: Stable CODE STATUS: Full Diet recommendation: Renal diet, 1.5 L fluid restriction  Brief/Interim Summary:  #) Shortness of breath due to end-stage renal disease and acute on chronic diastolic heart failure exacerbation: Patient was admitted with fluid overload secondary to end-stage renal disease and acute on chronic diastolic heart failure.  He was evaluated by nephrology and diuresis with furosemide was attempted.  Patient did not have much urine output.  A tunneled dialysis catheter was placed on 01/19/2018.  Patient did have a prior left brachiocephalic AV fistula created on 12/26/2017.  Patient underwent dialysis with resolution of shortness of breath and pulmonary edema and heart failure exacerbation.  Patient will be discharged with outpatient dialysis on Tuesday, Thursday, Saturday.  He was continued on his calcium acetate.   #) Hypoproliferative anemia due to iron deficiency and ESRD:  He was started on erythropoietin support.  He was continued on oral iron supplementation  #) Low-grade fever: Patient developed low-grade temperatures on 6/22, 6/23 of approximately 100.4 to 100.9.  Patient did not have any localizing signs or symptoms.  Chest x-ray showed pulmonary edema.  Blood cultures were negative.  Procalcitonin was undetectable.  Fever spontaneously resolved without any antibiotics.  #) Antiglomerular basement antibody disease: Patient is followed as an outpatient by oncology and pulmonology for this.  His renal biopsy did not show any evidence of anti-GBM disease however there was not  enough sample to perform IF.  #) Pancytopenia: Patient was followed by oncology.  This is been attributed to his Goodpasture's syndrome.  #) Nonalcoholic cirrhosis: Patient has nonalcoholic cirrhosis with ascites.  He did not require paracentesis here during this hospitalization.  His ammonia level was elevated he was started on lactulose.  #) Type 2 diabetes: Patient was started on subcu glargine 10 units nightly and sliding scale insulin.  He will restart his home 70/30 18 units twice daily.  #) Hypertension: Patient was continued on home carvedilol and amlodipine.  Discharge Diagnoses:  Principal Problem:   SOB (shortness of breath) Active Problems:   Type II diabetes mellitus with renal manifestations (HCC)   Acute on chronic diastolic CHF (congestive heart failure) (HCC)   Pancytopenia (HCC)   Essential hypertension   Cirrhosis of liver with ascites (HCC)   CKD (chronic kidney disease) stage 4, GFR 15-29 ml/min (HCC)   Fluid overload    Discharge Instructions  Discharge Instructions    Call MD for:  difficulty breathing, headache or visual disturbances   Complete by:  As directed    Call MD for:  hives   Complete by:  As directed    Call MD for:  persistant dizziness or light-headedness   Complete by:  As directed    Call MD for:  persistant nausea and vomiting   Complete by:  As directed    Call MD for:  redness, tenderness, or signs of infection (pain, swelling, redness, odor or green/yellow discharge around incision site)   Complete by:  As directed    Call MD for:  severe uncontrolled pain   Complete by:  As directed    Call MD for:  temperature >100.4   Complete by:  As directed    Diet - low sodium heart healthy   Complete by:  As directed    Discharge instructions   Complete by:  As directed    Please start dialysis every Tuesday/Thursday/Saturday.  Please follow-up with your primary care doctor in 1 week.  Please follow-up with your nephrologist in 1 to 2  weeks.   Increase activity slowly   Complete by:  As directed      Allergies as of 01/26/2018      Reactions   Heparin    Patient is Muslim and is not permitted Pork derative due to religious belief)      Medication List    STOP taking these medications   furosemide 40 MG tablet Commonly known as:  LASIX   metroNIDAZOLE 500 MG tablet Commonly known as:  FLAGYL   predniSONE 10 MG tablet Commonly known as:  DELTASONE     TAKE these medications   acetaminophen 650 MG CR tablet Commonly known as:  TYLENOL Take 650 mg by mouth every 8 (eight) hours as needed for pain.   amLODipine 10 MG tablet Commonly known as:  NORVASC Take 1 tablet (10 mg total) daily by mouth.   calcium acetate 667 MG capsule Commonly known as:  PHOSLO Take 2 capsules (1,334 mg total) by mouth 3 (three) times daily with meals.   carvedilol 12.5 MG tablet Commonly known as:  COREG Take 12.5 mg by mouth 2 (two) times daily with a meal.   insulin NPH-regular Human (70-30) 100 UNIT/ML injection Commonly known as:  NOVOLIN 70/30 Inject 18 Units into the skin 2 (two) times daily with a meal.   IRON 27 240 (27 FE) MG tablet Generic drug:  ferrous gluconate Take 240 mg by mouth daily.   lactulose 10 GM/15ML solution Commonly known as:  CHRONULAC Take 30 mLs (20 g total) by mouth 2 (two) times daily.   multivitamin with minerals tablet Take 1 tablet by mouth daily.       Allergies  Allergen Reactions  . Heparin     Patient is Muslim and is not permitted Pork derative due to religious belief)    Consultations:  Nephrology  Interventional radiology   Procedures/Studies: Dg Chest 2 View  Result Date: 01/16/2018 CLINICAL DATA:  Cough for 3 days, fever, shortness of breath EXAM: CHEST - 2 VIEW COMPARISON:  12/22/2017 FINDINGS: Enlargement of cardiac silhouette with pulmonary vascular congestion. Interstitial infiltrates consistent with pulmonary edema, slightly improved from previous exam.  No pleural effusion or pneumothorax. Bones unremarkable. Incidentally noted atherosclerotic calcification at the aortic arch. IMPRESSION: Improved pulmonary edema versus 12/22/2017. Electronically Signed   By: Lavonia Dana M.D.   On: 01/16/2018 17:03   Dg Chest Port 1 View  Result Date: 01/23/2018 CLINICAL DATA:  Fever EXAM: PORTABLE CHEST 1 VIEW COMPARISON:  January 19, 2018 FINDINGS: Port-A-Cath tip is in the right atrium. No pneumothorax. There is airspace consolidation in both mid and lower lung zones, slightly less than on recent study. No new opacity evident. Heart is mildly enlarged with pulmonary venous hypertension. IMPRESSION: Pulmonary vascular congestion. Evidence of interstitial and patchy alveolar edema, slightly less than on recent study. The overall appearance is felt to be indicative of congestive heart failure. A degree of superimposed pneumonia in the bases cannot be excluded. Central catheter tip is in the right atrium. No pneumothorax. No evident adenopathy. Electronically Signed   By: Lowella Grip III M.D.   On: 01/23/2018 12:41   Dg Chest [facility]  1 View  Result Date: 01/19/2018 CLINICAL DATA:  Central catheter placement EXAM: PORTABLE CHEST 1 VIEW COMPARISON:  January 16, 2018 FINDINGS: Central catheter tip is in the right atrium. No pneumothorax. There is diffuse interstitial and patchy alveolar edema with right pleural effusion. There is cardiomegaly with pulmonary venous hypertension. No adenopathy. No bone lesions. IMPRESSION: Central catheter tip in right atrium slightly beyond the cavoatrial junction. No pneumothorax. There is pulmonary vascular congestion with underlying congestive heart failure. Diffuse edema with small right pleural effusion. Electronically Signed   By: Lowella Grip III M.D.   On: 01/19/2018 14:20   Dg Fluoro Guide Cv Line-no Report  Result Date: 01/19/2018 Fluoroscopy was utilized by the requesting physician.  No radiographic interpretation.       Subjective:   Discharge Exam: Vitals:   01/26/18 0419 01/26/18 0844  BP: (!) 149/56 (!) 157/68  Pulse: 79 89  Resp:    Temp: 99.1 F (37.3 C)   SpO2: 96%    Vitals:   01/25/18 1652 01/25/18 1958 01/26/18 0419 01/26/18 0844  BP: (!) 156/73 (!) 142/62 (!) 149/56 (!) 157/68  Pulse:  82 79 89  Resp:      Temp:  98.1 F (36.7 C) 99.1 F (37.3 C)   TempSrc:  Oral Oral   SpO2:  98% 96%   Weight:      Height:        General exam: Appears calm and comfortable  Respiratory system: Clear to auscultation. Respiratory effort normal.  Diminished lung sounds at bases Cardiovascular system: Regular rate and rhythm, no murmurs Gastrointestinal system: Soft, mildly distended, dullness at sides, plus bowel sounds, no rebound or guarding Central nervous system: Alert and oriented. No focal neurological deficits. Extremities: No lower extremity edema Skin: Left upper extremity AV fistula with bruit and thrill, right IJ site is clean dry intact Psychiatry: Judgement and insight appear normal. Mood & affect appropriate.      The results of significant diagnostics from this hospitalization (including imaging, microbiology, ancillary and laboratory) are listed below for reference.     Microbiology: Recent Results (from the past 240 hour(s))  Culture, blood (routine x 2)     Status: None (Preliminary result)   Collection Time: 01/23/18  2:01 PM  Result Value Ref Range Status   Specimen Description BLOOD RIGHT ANTECUBITAL  Final   Special Requests   Final    BOTTLES DRAWN AEROBIC AND ANAEROBIC Blood Culture adequate volume   Culture   Final    NO GROWTH 2 DAYS Performed at [facility], [address], [place], [place] [address]    Report Status PENDING  Incomplete  Culture, blood (routine x 2)     Status: None (Preliminary result)   Collection Time: 01/23/18  2:11 PM  Result Value Ref Range Status   Specimen Description BLOOD RIGHT HAND  Final   Special Requests    Final    BOTTLES DRAWN AEROBIC AND ANAEROBIC Blood Culture adequate volume   Culture   Final    NO GROWTH 2 DAYS Performed at [facility], [place] [address], [place], [place] [address]    Report Status PENDING  Incomplete     Labs: BNP (last 3 results) Recent Labs    11/06/17 1713 12/19/17 1414 01/16/18 1731  BNP 758.5* 716.4* 4,562.5*   Basic Metabolic Panel: Recent Labs  Lab 01/20/18 0037  01/22/18 0430 01/23/18 0742 01/24/18 0415 01/25/18 0756 01/26/18 0507  NA 133*   < >  138 136 137 137 135  K 5.2*   < > 4.3 4.5 3.6 4.0 3.9  CL 106   < > 101 100* 101 101 98*  CO2 15*   < > 28 26 28 28 26   GLUCOSE 400*   < > 158* 240* 234* 201* 149*  BUN 108*   < > 22* 38* 15 16 29*  CREATININE 5.12*   < > 2.44* 3.40* 2.24* 2.52* 3.11*  CALCIUM 7.4*   < > 7.9* 8.2* 8.2* 8.4* 8.9  MG  --   --  1.9  --   --  1.9  --   PHOS 6.3*  --  2.6 3.2 2.8  --  3.5   < > = values in this interval not displayed.   Liver Function Tests: Recent Labs  Lab 01/22/18 0430 01/23/18 0742 01/24/18 0415 01/25/18 0756 01/26/18 0507  AST  --   --   --  34  --   ALT  --   --   --  15*  --   ALKPHOS  --   --   --  82  --   BILITOT  --   --   --  1.7*  --   PROT  --   --   --  5.7*  --   ALBUMIN 3.1* 3.0* 2.8* 3.0* 3.0*   No results for input(s): LIPASE, AMYLASE in the last 168 hours. Recent Labs  Lab 01/21/18 0417  AMMONIA 27   CBC: Recent Labs  Lab 01/22/18 0430 01/23/18 0742 01/24/18 0415 01/25/18 0756 01/26/18 0507  WBC 4.1 3.5* 2.9* 2.9* 3.1*  NEUTROABS  --   --   --  1.4*  --   HGB 9.6* 9.2* 9.4* 9.7* 10.0*  HCT 31.7* 30.3* 31.3* 32.0* 32.7*  MCV 81.7 82.8 84.8 82.7 80.5  PLT 73* 57* 62* 48* 42*   Cardiac Enzymes: No results for input(s): CKTOTAL, CKMB, CKMBINDEX, TROPONINI in the last 168 hours. BNP: Invalid input(s): POCBNP CBG: Recent Labs  Lab 01/25/18 0727 01/25/18 1150 01/25/18 1626 01/25/18 2104 01/26/18 0732  GLUCAP 192* 281* 237* 178* 225*   D-Dimer No  results for input(s): DDIMER in the last 72 hours. Hgb A1c No results for input(s): HGBA1C in the last 72 hours. Lipid Profile No results for input(s): CHOL, HDL, LDLCALC, TRIG, CHOLHDL, LDLDIRECT in the last 72 hours. Thyroid function studies No results for input(s): TSH, T4TOTAL, T3FREE, THYROIDAB in the last 72 hours.  Invalid input(s): FREET3 Anemia work up No results for input(s): VITAMINB12, FOLATE, FERRITIN, TIBC, IRON, RETICCTPCT in the last 72 hours. Urinalysis    Component Value Date/Time   COLORURINE YELLOW 12/19/2017 2230   APPEARANCEUR CLEAR 12/19/2017 2230   LABSPEC 1.011 12/19/2017 2230   PHURINE 7.0 12/19/2017 2230   GLUCOSEU NEGATIVE 12/19/2017 2230   HGBUR SMALL (A) 12/19/2017 2230   BILIRUBINUR NEGATIVE 12/19/2017 2230   BILIRUBINUR neg 09/14/2015 1441   KETONESUR NEGATIVE 12/19/2017 2230   PROTEINUR 100 (A) 12/19/2017 2230   UROBILINOGEN 0.2 09/14/2015 1441   UROBILINOGEN 0.2 01/27/2013 2052   NITRITE NEGATIVE 12/19/2017 2230   LEUKOCYTESUR NEGATIVE 12/19/2017 2230   Sepsis Labs Invalid input(s): PROCALCITONIN,  WBC,  LACTICIDVEN Microbiology Recent Results (from the past 240 hour(s))  Culture, blood (routine x 2)     Status: None (Preliminary result)   Collection Time: 01/23/18  2:01 PM  Result Value Ref Range Status   Specimen Description BLOOD RIGHT ANTECUBITAL  Final   Special Requests  Final    BOTTLES DRAWN AEROBIC AND ANAEROBIC Blood Culture adequate volume   Culture   Final    NO GROWTH 2 DAYS Performed at [facility], [place] [address] [place], [place] [address]    Report Status PENDING  Incomplete  Culture, blood (routine x 2)     Status: None (Preliminary result)   Collection Time: 01/23/18  2:11 PM  Result Value Ref Range Status   Specimen Description BLOOD RIGHT HAND  Final   Special Requests   Final    BOTTLES DRAWN AEROBIC AND ANAEROBIC Blood Culture adequate volume   Culture   Final    NO GROWTH 2 DAYS Performed at [facility], [place] [address], [place], [place] [address]    Report Status PENDING  Incomplete     Time coordinating discharge: Over 30 minutes  SIGNED:   Cristy Folks, MD  [facility] 01/26/2018, 10:16 AM Pager   If 7PM-7AM, please contact night-coverage [email or web address] Password [email or web address]

## 2018-01-27 DIAGNOSIS — D509 Iron deficiency anemia, unspecified: Secondary | ICD-10-CM | POA: Diagnosis not present

## 2018-01-27 DIAGNOSIS — E1129 Type 2 diabetes mellitus with other diabetic kidney complication: Secondary | ICD-10-CM | POA: Diagnosis not present

## 2018-01-27 DIAGNOSIS — N186 End stage renal disease: Secondary | ICD-10-CM | POA: Diagnosis not present

## 2018-01-27 DIAGNOSIS — N2581 Secondary hyperparathyroidism of renal origin: Secondary | ICD-10-CM | POA: Diagnosis not present

## 2018-01-28 ENCOUNTER — Inpatient Hospital Stay (HOSPITAL_COMMUNITY)
Admission: RE | Admit: 2018-01-28 | Discharge: 2018-01-28 | Disposition: A | Discharge status: Home / Self Care | Payer: Medicaid Other | Source: Ambulatory Visit | Attending: Nephrology | Admitting: Nephrology

## 2018-01-28 LAB — CULTURE, BLOOD (ROUTINE X 2)
Culture: NO GROWTH
Culture: NO GROWTH
Special Requests: ADEQUATE
Special Requests: ADEQUATE

## 2018-01-29 DIAGNOSIS — N2581 Secondary hyperparathyroidism of renal origin: Secondary | ICD-10-CM | POA: Diagnosis not present

## 2018-01-29 DIAGNOSIS — D509 Iron deficiency anemia, unspecified: Secondary | ICD-10-CM | POA: Diagnosis not present

## 2018-01-29 DIAGNOSIS — N186 End stage renal disease: Secondary | ICD-10-CM | POA: Diagnosis not present

## 2018-01-31 DIAGNOSIS — D509 Iron deficiency anemia, unspecified: Secondary | ICD-10-CM | POA: Diagnosis not present

## 2018-01-31 DIAGNOSIS — N2581 Secondary hyperparathyroidism of renal origin: Secondary | ICD-10-CM | POA: Diagnosis not present

## 2018-01-31 DIAGNOSIS — N186 End stage renal disease: Secondary | ICD-10-CM | POA: Diagnosis not present

## 2018-02-01 DIAGNOSIS — E1122 Type 2 diabetes mellitus with diabetic chronic kidney disease: Secondary | ICD-10-CM | POA: Diagnosis not present

## 2018-02-01 DIAGNOSIS — N186 End stage renal disease: Secondary | ICD-10-CM | POA: Diagnosis not present

## 2018-02-01 DIAGNOSIS — Z992 Dependence on renal dialysis: Secondary | ICD-10-CM | POA: Diagnosis not present

## 2018-02-03 DIAGNOSIS — D631 Anemia in chronic kidney disease: Secondary | ICD-10-CM | POA: Diagnosis not present

## 2018-02-03 DIAGNOSIS — D509 Iron deficiency anemia, unspecified: Secondary | ICD-10-CM | POA: Diagnosis not present

## 2018-02-03 DIAGNOSIS — N2581 Secondary hyperparathyroidism of renal origin: Secondary | ICD-10-CM | POA: Diagnosis not present

## 2018-02-03 DIAGNOSIS — N186 End stage renal disease: Secondary | ICD-10-CM | POA: Diagnosis not present

## 2018-02-04 DIAGNOSIS — E441 Mild protein-calorie malnutrition: Secondary | ICD-10-CM | POA: Insufficient documentation

## 2018-02-05 DIAGNOSIS — D631 Anemia in chronic kidney disease: Secondary | ICD-10-CM | POA: Diagnosis not present

## 2018-02-05 DIAGNOSIS — N186 End stage renal disease: Secondary | ICD-10-CM | POA: Diagnosis not present

## 2018-02-05 DIAGNOSIS — N2581 Secondary hyperparathyroidism of renal origin: Secondary | ICD-10-CM | POA: Diagnosis not present

## 2018-02-05 DIAGNOSIS — D509 Iron deficiency anemia, unspecified: Secondary | ICD-10-CM | POA: Diagnosis not present

## 2018-02-06 ENCOUNTER — Telehealth: Payer: Self-pay | Admitting: Vascular Surgery

## 2018-02-06 ENCOUNTER — Encounter: Payer: Self-pay | Admitting: Physician Assistant

## 2018-02-06 ENCOUNTER — Encounter: Payer: Self-pay | Admitting: *Deleted

## 2018-02-06 ENCOUNTER — Other Ambulatory Visit: Payer: Self-pay | Admitting: *Deleted

## 2018-02-06 ENCOUNTER — Other Ambulatory Visit: Payer: Self-pay

## 2018-02-06 ENCOUNTER — Ambulatory Visit (INDEPENDENT_AMBULATORY_CARE_PROVIDER_SITE_OTHER): Payer: Medicaid Other | Admitting: Physician Assistant

## 2018-02-06 DIAGNOSIS — Z992 Dependence on renal dialysis: Secondary | ICD-10-CM

## 2018-02-06 DIAGNOSIS — N186 End stage renal disease: Secondary | ICD-10-CM | POA: Insufficient documentation

## 2018-02-06 NOTE — Progress Notes (Signed)
Established Dialysis Access   History of Present Illness   William [name] is a 67 y.o. (Jul 12, 1951) male who presents for re-evaluation of left arm dialysis access.  He is status post first stage left arm basilic vein transposition by Dr. Bridgett [name] on 12/26/2017.  Since his surgery he was started on dialysis and a right IJ tunneled dialysis catheter was placed by Dr. Donnetta [name] on 01/19/2018.  An interpreter was present for office visit today as well as the patient's son who also speaks [place].  The patient did not have any difficulty healing his incision postoperatively.  He denies any signs or symptoms of steal syndrome in his left hand.  He states the right IJ tunneled dialysis catheter is working well for dialysis.  He is dialyzing on a Tuesday Thursday Saturday schedule.  The patient is aware that he will require an additional surgery prior to his fistula being ready for use.   The patient's PMH, PSH, SH, and FamHx were reviewed and are unchanged from prior visit.  Current Outpatient Medications  Medication Sig Dispense Refill  . acetaminophen (TYLENOL) 650 MG CR tablet Take 650 mg by mouth every 8 (eight) hours as needed for pain.    Marland Kitchen amLODipine (NORVASC) 10 MG tablet Take 1 tablet (10 mg total) daily by mouth. 90 tablet 0  . calcium acetate (PHOSLO) 667 MG capsule Take 2 capsules (1,334 mg total) by mouth 3 (three) times daily with meals. 90 capsule 1  . carvedilol (COREG) 12.5 MG tablet Take 12.5 mg by mouth 2 (two) times daily with a meal.    . ferrous gluconate (IRON 27) 240 (27 FE) MG tablet Take 240 mg by mouth daily.     . insulin NPH-regular Human (NOVOLIN 70/30) (70-30) 100 UNIT/ML injection Inject 18 Units into the skin 2 (two) times daily with a meal. 10 mL 1  . lactulose (CHRONULAC) 10 GM/15ML solution Take 30 mLs (20 g total) by mouth 2 (two) times daily. 240 mL 1  . Multiple Vitamins-Minerals (MULTIVITAMIN WITH MINERALS) tablet Take 1 tablet by mouth daily.     No current  facility-administered medications for this visit.     On ROS today: 10 system ROS is negative unless otherwise noted in HPI   Physical Examination   Vitals:   02/06/18 1508  BP: 130/70  [name]: 78  Resp: 16  Temp: 98.4 F (36.9 C)  TempSrc: Oral  SpO2: 98%  Weight: 136 lb (61.7 kg)  Height: 5' 5"  (1.651 m)   Body mass index is 22.63 kg/m.  General Alert, O x 3, WD, NAD  Pulmonary Sym exp, good B air movt, CTA B  Cardiac RRR, Nl S1, S2,   Vascular Vessel Right Left  Radial Palpable Palpable  Brachial Palpable Palpable  Ulnar Palpable Palpable    Musculo- skeletal  incisions of left antecubitum are well-healed; palpable left radial [name]; palpable thrill near antecubitum and audible bruit throughout length of upper arm M/S 5/5 throughout  , Extremities without ischemic changes    Neurologic A&O; CN grossly intact     Non-invasive Vascular Imaging   Ultrasound performed in exam room at time of office visit  Patent left brachiobasilic fistula  Diameter ranging from 0.53cm in mid upper arm to 0.65cm in lower upper arm and in upper upper arm   Medical Decision Making   William [name] is a 67 y.o. male who presents status post first stage basilic vein transposition with subsequent right IJ tunneled dialysis catheter placement due  to initiation of hemodialysis   Based on physical exam as well as ultrasound findings, left arm fistula is ready for second stage basilic vein transposition  This will be scheduled with Dr. Bridgett [name] on a nondialysis day  Patient will continue hemodialysis via right IJ tunneled dialysis catheter Risk, benefits, and alternatives to access surgery were discussed.   The patient is aware the risks include but are not limited to: bleeding, infection, steal syndrome, nerve damage, thrombosis, failure to mature, complications related to venous hypertension, need for additional procedures.  The patient agrees to proceed forward with the  procedure.   William Ligas PA-C [facility] Office: [phone]

## 2018-02-07 DIAGNOSIS — N186 End stage renal disease: Secondary | ICD-10-CM | POA: Diagnosis not present

## 2018-02-07 DIAGNOSIS — N2581 Secondary hyperparathyroidism of renal origin: Secondary | ICD-10-CM | POA: Diagnosis not present

## 2018-02-07 DIAGNOSIS — D509 Iron deficiency anemia, unspecified: Secondary | ICD-10-CM | POA: Diagnosis not present

## 2018-02-07 DIAGNOSIS — D631 Anemia in chronic kidney disease: Secondary | ICD-10-CM | POA: Diagnosis not present

## 2018-02-10 DIAGNOSIS — D631 Anemia in chronic kidney disease: Secondary | ICD-10-CM | POA: Diagnosis not present

## 2018-02-10 DIAGNOSIS — N2581 Secondary hyperparathyroidism of renal origin: Secondary | ICD-10-CM | POA: Diagnosis not present

## 2018-02-10 DIAGNOSIS — N186 End stage renal disease: Secondary | ICD-10-CM | POA: Diagnosis not present

## 2018-02-10 DIAGNOSIS — D509 Iron deficiency anemia, unspecified: Secondary | ICD-10-CM | POA: Diagnosis not present

## 2018-02-11 ENCOUNTER — Encounter (HOSPITAL_COMMUNITY): Payer: Medicaid Other

## 2018-02-11 DIAGNOSIS — E113592 Type 2 diabetes mellitus with proliferative diabetic retinopathy without macular edema, left eye: Secondary | ICD-10-CM | POA: Diagnosis not present

## 2018-02-12 DIAGNOSIS — N186 End stage renal disease: Secondary | ICD-10-CM | POA: Diagnosis not present

## 2018-02-12 DIAGNOSIS — D631 Anemia in chronic kidney disease: Secondary | ICD-10-CM | POA: Diagnosis not present

## 2018-02-12 DIAGNOSIS — D509 Iron deficiency anemia, unspecified: Secondary | ICD-10-CM | POA: Diagnosis not present

## 2018-02-12 DIAGNOSIS — N2581 Secondary hyperparathyroidism of renal origin: Secondary | ICD-10-CM | POA: Diagnosis not present

## 2018-02-13 ENCOUNTER — Other Ambulatory Visit: Payer: Self-pay

## 2018-02-13 ENCOUNTER — Encounter (HOSPITAL_COMMUNITY): Payer: Self-pay | Admitting: *Deleted

## 2018-02-13 NOTE — Progress Notes (Signed)
SDW-pre-op call completed by pt son, William [name] (DPR). Son denies any acute cardiopulmonary issues. William [name] denies that pt is under the care of a cardiologist. Son denies that pt had a cardiac cath and stress test. Son Made aware to have pt stop taking vitamins, fish oil and herbal medications. Do not take any NSAIDs ie: Ibuprofen, Advil, Naproxen (Aleve), Motrin, BC and Goody Powder. Son made aware to have pt take only 12 units of Novolin 70/30 at HS the night before surgery and no insulin DOS. Son made aware to check BG every 2 hours prior to arrival to hospital on DOS. Son made aware to treat a BG < 70 with 4 glucose tabs, wait 15 minutes after intervention to recheck BG, if BG remains < 70, call Short Stay unit to speak with a nurse. Son verbalized understanding of all pre-op instructions.

## 2018-02-13 NOTE — Progress Notes (Signed)
Anesthesia Chart Review:  Case:  [id number] Date/Time:  02/16/18 1040   Procedure:  BRACHIOBASILIC VEIN TRANSPOSITION SECOND STAGE (Left )   Anesthesia type:  Choice   Pre-op diagnosis:  end stage renal disease   Location:  [facility] OR ROOM 11 / [place] [facility]   Surgeon:  Conrad Iuka, MD      DISCUSSION: 67 yo male never smoker scheduled for above procedure. Pertinent medical hx includes GERD, HTN, Grade 1 diastolic dysfunction, Anemia, ESRD on HD T/TH/S, Goodpasture's, Nonalcoholic Cirrhosis w/ascites, Pancytopenia, DMII.  Pt was recently hospitalized 01/16/2018-01/26/2018 for shortness of breath due to end-stage renal disease and acute on chronic diastolic heart failure exacerbation. A tunneled dialysis catheter was placed on 01/19/2018.  Patient did have a prior left brachiocephalic AV fistula created on 12/26/2017.  Patient underwent dialysis with resolution of shortness of breath and pulmonary edema and heart failure exacerbation.   Pt is medically complex be appears to be doing better since starting dialysis. Anticipate pt can proceed with procedure as scheduled barring acute status change.  PROVIDERS: Gildardo Pounds, NP is PCP  [facility] provide nephrology care  Pt follows with [facility]. Per notes during last hospitalization concern for ANCA vasculitis and plan for transbronchial biopsy when platelet count is improved.  LABS: Labs reviewed: Acceptable for surgery. (all labs ordered are listed, but only abnormal results are displayed)  Lab Results  Component Value Date   WBC 3.1 (L) 01/26/2018   HGB 10.0 (L) 01/26/2018   HCT 32.7 (L) 01/26/2018   PLT 42 (L) 01/26/2018   GLUCOSE 149 (H) 01/26/2018   CHOL 213 (H) 06/20/2017   TRIG 216 (H) 06/20/2017   HDL 34 (L) 06/20/2017   LDLCALC 136 (H) 06/20/2017   ALT 15 (L) 01/25/2018   AST 34 01/25/2018   NA 135 01/26/2018   K 3.9 01/26/2018   CL 98 (L) 01/26/2018   CREATININE 3.11 (H) 01/26/2018   BUN 29 (H) 01/26/2018    CO2 26 01/26/2018   TSH 3.27 09/25/2017   INR 1.28 01/17/2018   HGBA1C 8.1 06/20/2017   MICROALBUR 91.4 08/25/2015     IMAGES: Dg Chest 2 View 01/16/2018 Enlargement of cardiac silhouette with pulmonary vascular congestion. Interstitial infiltrates consistent with pulmonary edema, slightly improved from previous exam. No pleural effusion or pneumothorax. Bones unremarkable. Incidentally noted atherosclerotic calcification at the aortic arch. IMPRESSION: Improved pulmonary edema versus 12/22/2017.    EKG: 01/17/2018: NSR with nonspecific ST abnormality  CV: ECHO 10/21/2017 - Left ventricle: The cavity size was normal. Systolic function was   vigorous. The estimated ejection fraction was in the range of 65%   to 70%. Wall motion was normal; there were no regional wall   motion abnormalities. Doppler parameters are consistent with   abnormal left ventricular relaxation (grade 1 diastolic   dysfunction). There was no evidence of elevated ventricular   filling pressure by Doppler parameters. - Aortic valve: There was no regurgitation. - Mitral valve: There was mild regurgitation. - Right ventricle: The cavity size was normal. Wall thickness was   normal. Systolic function was normal. - Right atrium: The atrium was normal in size. - Pulmonary arteries: Systolic pressure was increased. PA peak   pressure: 32 mm Hg (S). - Inferior vena cava: The vessel was normal in size. The   respirophasic diameter changes were in the normal range (= 50%),   consistent with normal central venous pressure. - Pericardium, extracardiac: There was no pericardial effusion. Past Medical History:  Diagnosis Date  . Anemia   . CHF (congestive heart failure) ([place])   . Cirrhosis ([place])   . Cirrhosis ([place])   . CKD (chronic kidney disease), stage IV ([place])   . Goodpasture's disease [facility])    on outpatient plasmapheresis/notes 11/20/2017  . Grade I diastolic dysfunction 95/28/4132  . History of anemia due to  chronic kidney disease   . History of blood transfusion X 1   "UGIB; low blood count"  . History of plasmapheresis    "qod" (11/20/2017)  . Hypertension   . Pancytopenia ([place]) 08/20/2017  . Type II diabetes mellitus ([place])     Past Surgical History:  Procedure Laterality Date  . AV FISTULA PLACEMENT Left 12/26/2017   Procedure: LEFT BRACHIOCEPHALIC ARTERIOVENOUS (AV) FISTULA CREATION;  Surgeon: Conrad [place], MD;  Location: [place];  Service: Vascular;  Laterality: Left;  . CATARACT EXTRACTION W/ INTRAOCULAR LENS  IMPLANT, BILATERAL Bilateral   . INSERTION OF DIALYSIS CATHETER N/A 01/19/2018   Procedure: INSERTION OF DIALYSIS CATHETER - RIGHT INTERNAL JUGULAR PLACEMENT;  Surgeon: Rosetta Posner, MD;  Location: [place];  Service: Vascular;  Laterality: N/A;  . IR FLUORO GUIDE CV LINE RIGHT  11/10/2017  . IR PARACENTESIS  12/10/2017  . IR PARACENTESIS  12/26/2017  . IR REMOVAL TUN CV CATH W/O FL  11/28/2017  . IR US GUIDE VASC ACCESS RIGHT  11/10/2017  . NECK SURGERY  2007   "back of neck; no hardware in there"    MEDICATIONS: No current facility-administered medications for this encounter.    Marland Kitchen amLODipine (NORVASC) 10 MG tablet  . calcium acetate (PHOSLO) 667 MG capsule  . carvedilol (COREG) 12.5 MG tablet  . ferrous gluconate (IRON 27) 240 (27 FE) MG tablet  . insulin NPH-regular Human (NOVOLIN 70/30) (70-30) 100 UNIT/ML injection  . Multiple Vitamins-Minerals (MULTIVITAMIN WITH MINERALS) tablet  . acetaminophen (TYLENOL) 650 MG CR tablet  . lactulose [facility]) 10 GM/15ML solution     Wynonia Musty [facility] Stay [facility]/Anesthesiology Phone [phone] 02/13/2018 3:18 PM

## 2018-02-14 DIAGNOSIS — N2581 Secondary hyperparathyroidism of renal origin: Secondary | ICD-10-CM | POA: Diagnosis not present

## 2018-02-14 DIAGNOSIS — N186 End stage renal disease: Secondary | ICD-10-CM | POA: Diagnosis not present

## 2018-02-14 DIAGNOSIS — D631 Anemia in chronic kidney disease: Secondary | ICD-10-CM | POA: Diagnosis not present

## 2018-02-14 DIAGNOSIS — D509 Iron deficiency anemia, unspecified: Secondary | ICD-10-CM | POA: Diagnosis not present

## 2018-02-16 ENCOUNTER — Ambulatory Visit (HOSPITAL_COMMUNITY): Payer: Medicare Other | Admitting: Physician Assistant

## 2018-02-16 ENCOUNTER — Ambulatory Visit (HOSPITAL_COMMUNITY)
Admission: RE | Admit: 2018-02-16 | Discharge: 2018-02-16 | Disposition: A | Discharge status: Home / Self Care | Payer: Medicare Other | Source: Ambulatory Visit | Attending: Vascular Surgery | Admitting: Vascular Surgery

## 2018-02-16 ENCOUNTER — Encounter (HOSPITAL_COMMUNITY): Payer: Self-pay | Admitting: *Deleted

## 2018-02-16 ENCOUNTER — Encounter (HOSPITAL_COMMUNITY)
Admission: RE | Disposition: A | Discharge status: Home / Self Care | Payer: Self-pay | Source: Ambulatory Visit | Attending: Vascular Surgery

## 2018-02-16 DIAGNOSIS — I509 Heart failure, unspecified: Secondary | ICD-10-CM | POA: Diagnosis not present

## 2018-02-16 DIAGNOSIS — M310 Hypersensitivity angiitis: Secondary | ICD-10-CM | POA: Insufficient documentation

## 2018-02-16 DIAGNOSIS — D61818 Other pancytopenia: Secondary | ICD-10-CM | POA: Diagnosis not present

## 2018-02-16 DIAGNOSIS — I132 Hypertensive heart and chronic kidney disease with heart failure and with stage 5 chronic kidney disease, or end stage renal disease: Secondary | ICD-10-CM | POA: Diagnosis not present

## 2018-02-16 DIAGNOSIS — N186 End stage renal disease: Secondary | ICD-10-CM | POA: Insufficient documentation

## 2018-02-16 DIAGNOSIS — E1122 Type 2 diabetes mellitus with diabetic chronic kidney disease: Secondary | ICD-10-CM | POA: Insufficient documentation

## 2018-02-16 DIAGNOSIS — D631 Anemia in chronic kidney disease: Secondary | ICD-10-CM | POA: Insufficient documentation

## 2018-02-16 DIAGNOSIS — Z794 Long term (current) use of insulin: Secondary | ICD-10-CM | POA: Diagnosis not present

## 2018-02-16 DIAGNOSIS — Z992 Dependence on renal dialysis: Secondary | ICD-10-CM | POA: Insufficient documentation

## 2018-02-16 DIAGNOSIS — I5033 Acute on chronic diastolic (congestive) heart failure: Secondary | ICD-10-CM | POA: Diagnosis not present

## 2018-02-16 DIAGNOSIS — K7460 Unspecified cirrhosis of liver: Secondary | ICD-10-CM | POA: Diagnosis not present

## 2018-02-16 HISTORY — PX: BASCILIC VEIN TRANSPOSITION: SHX5742

## 2018-02-16 LAB — POCT I-STAT 4, (NA,K, GLUC, HGB,HCT)
Glucose, Bld: 129 mg/dL — ABNORMAL HIGH (ref 70–99)
HCT: 29 % — ABNORMAL LOW (ref 39.0–52.0)
HEMOGLOBIN: 9.9 g/dL — AB (ref 13.0–17.0)
Potassium: 4.2 mmol/L (ref 3.5–5.1)
Sodium: 131 mmol/L — ABNORMAL LOW (ref 135–145)

## 2018-02-16 LAB — GLUCOSE, CAPILLARY
GLUCOSE-CAPILLARY: 183 mg/dL — AB (ref 70–99)
Glucose-Capillary: 124 mg/dL — ABNORMAL HIGH (ref 70–99)

## 2018-02-16 SURGERY — TRANSPOSITION, VEIN, BASILIC
Anesthesia: Monitor Anesthesia Care | Site: Arm Upper | Laterality: Left

## 2018-02-16 MED ORDER — HEMOSTATIC AGENTS (NO CHARGE) OPTIME
TOPICAL | Status: DC | PRN
  Administered 2018-02-16: 1 via TOPICAL

## 2018-02-16 MED ORDER — PROPOFOL 10 MG/ML IV BOLUS
INTRAVENOUS | Status: AC
  Filled 2018-02-16: qty 20

## 2018-02-16 MED ORDER — FENTANYL CITRATE (PF) 250 MCG/5ML IJ SOLN
INTRAMUSCULAR | Status: AC
  Filled 2018-02-16: qty 5

## 2018-02-16 MED ORDER — LIDOCAINE HCL (CARDIAC) PF 100 MG/5ML IV SOSY
PREFILLED_SYRINGE | INTRAVENOUS | Status: DC | PRN
  Administered 2018-02-16: 80 mg via INTRATRACHEAL

## 2018-02-16 MED ORDER — ONDANSETRON HCL 4 MG/2ML IJ SOLN
INTRAMUSCULAR | Status: DC | PRN
  Administered 2018-02-16: 4 mg via INTRAVENOUS

## 2018-02-16 MED ORDER — OXYCODONE HCL 5 MG PO TABS: 5.0000 mg | ORAL_TABLET | Freq: Three times a day (TID) | ORAL | 0 refills | Status: DC | PRN

## 2018-02-16 MED ORDER — SODIUM CHLORIDE 0.9 % IV SOLN
INTRAVENOUS | Status: DC | PRN
  Administered 2018-02-16: 25 ug/min via INTRAVENOUS

## 2018-02-16 MED ORDER — PROPOFOL 10 MG/ML IV BOLUS
INTRAVENOUS | Status: DC | PRN
  Administered 2018-02-16: 200 mg via INTRAVENOUS

## 2018-02-16 MED ORDER — FENTANYL CITRATE (PF) 100 MCG/2ML IJ SOLN
INTRAMUSCULAR | Status: DC | PRN
  Administered 2018-02-16: 25 ug via INTRAVENOUS
  Administered 2018-02-16: 12.5 ug via INTRAVENOUS
  Administered 2018-02-16: 25 ug via INTRAVENOUS

## 2018-02-16 MED ORDER — SODIUM CHLORIDE 0.9 % IV SOLN
INTRAVENOUS | Status: AC | PRN
  Administered 2018-02-16: 500 mL

## 2018-02-16 MED ORDER — SODIUM CHLORIDE 0.9 % IV SOLN
INTRAVENOUS | Status: DC
  Administered 2018-02-16: 25 mL/h via INTRAVENOUS

## 2018-02-16 MED ORDER — 0.9 % SODIUM CHLORIDE (POUR BTL) OPTIME
TOPICAL | Status: DC | PRN
  Administered 2018-02-16: 1000 mL

## 2018-02-16 MED ORDER — MIDAZOLAM HCL 2 MG/2ML IJ SOLN
INTRAMUSCULAR | Status: AC
  Filled 2018-02-16: qty 2

## 2018-02-16 MED ORDER — CEFAZOLIN SODIUM-DEXTROSE 2-4 GM/100ML-% IV SOLN
2.0000 g | INTRAVENOUS | Status: AC
  Administered 2018-02-16: 2 g via INTRAVENOUS
  Filled 2018-02-16: qty 100

## 2018-02-16 SURGICAL SUPPLY — 47 items
AGENT HMST SPONGE THK3/8 (HEMOSTASIS) ×1
ARMBAND PINK RESTRICT EXTREMIT (MISCELLANEOUS) ×3 IMPLANT
CANISTER SUCT 3000ML PPV (MISCELLANEOUS) ×3 IMPLANT
CLIP VESOCCLUDE MED 24/CT (CLIP) ×3 IMPLANT
CLIP VESOCCLUDE SM WIDE 24/CT (CLIP) ×3 IMPLANT
COVER PROBE W GEL 5X96 (DRAPES) ×3 IMPLANT
CUFF TOURNIQUET SINGLE 18IN (TOURNIQUET CUFF) ×2 IMPLANT
DECANTER SPIKE VIAL GLASS SM (MISCELLANEOUS) ×3 IMPLANT
DRSG COVADERM 4X14 (GAUZE/BANDAGES/DRESSINGS) ×2 IMPLANT
ELECT REM PT RETURN 9FT ADLT (ELECTROSURGICAL) ×3
ELECTRODE REM PT RTRN 9FT ADLT (ELECTROSURGICAL) ×1 IMPLANT
GAUZE SPONGE 4X4 12PLY STRL LF (GAUZE/BANDAGES/DRESSINGS) ×2 IMPLANT
GLOVE BIO SURGEON STRL SZ7 (GLOVE) ×3 IMPLANT
GLOVE BIO SURGEON STRL SZ7.5 (GLOVE) ×2 IMPLANT
GLOVE BIOGEL PI IND STRL 6.5 (GLOVE) IMPLANT
GLOVE BIOGEL PI IND STRL 7.0 (GLOVE) IMPLANT
GLOVE BIOGEL PI IND STRL 7.5 (GLOVE) ×1 IMPLANT
GLOVE BIOGEL PI INDICATOR 6.5 (GLOVE) ×4
GLOVE BIOGEL PI INDICATOR 7.0 (GLOVE) ×4
GLOVE BIOGEL PI INDICATOR 7.5 (GLOVE) ×2
GLOVE ECLIPSE 7.0 STRL STRAW (GLOVE) ×2 IMPLANT
GLOVE SURG SS PI 6.0 STRL IVOR (GLOVE) ×2 IMPLANT
GOWN STRL REUS W/ TWL LRG LVL3 (GOWN DISPOSABLE) ×3 IMPLANT
GOWN STRL REUS W/ TWL XL LVL3 (GOWN DISPOSABLE) IMPLANT
GOWN STRL REUS W/TWL LRG LVL3 (GOWN DISPOSABLE) ×9
GOWN STRL REUS W/TWL XL LVL3 (GOWN DISPOSABLE) ×3
HEMOSTAT SPONGE AVITENE ULTRA (HEMOSTASIS) ×2 IMPLANT
KIT BASIN OR (CUSTOM PROCEDURE TRAY) ×3 IMPLANT
KIT TURNOVER KIT B (KITS) ×3 IMPLANT
NDL HYPO 25GX1X1/2 BEV (NEEDLE) ×1 IMPLANT
NEEDLE HYPO 25GX1X1/2 BEV (NEEDLE) ×3 IMPLANT
NS IRRIG 1000ML POUR BTL (IV SOLUTION) ×3 IMPLANT
PACK CV ACCESS (CUSTOM PROCEDURE TRAY) ×3 IMPLANT
PAD ARMBOARD 7.5X6 YLW CONV (MISCELLANEOUS) ×6 IMPLANT
STAPLER VISISTAT (STAPLE) ×2 IMPLANT
SUT MNCRL AB 4-0 PS2 18 (SUTURE) ×3 IMPLANT
SUT PROLENE 6 0 BV (SUTURE) ×15 IMPLANT
SUT SILK 2 0 SH (SUTURE) IMPLANT
SUT SILK 3 0 (SUTURE) ×3
SUT SILK 3-0 18XBRD TIE 12 (SUTURE) IMPLANT
SUT VIC AB 2-0 CT1 27 (SUTURE) ×3
SUT VIC AB 2-0 CT1 TAPERPNT 27 (SUTURE) ×1 IMPLANT
SUT VIC AB 3-0 SH 27 (SUTURE) ×15
SUT VIC AB 3-0 SH 27X BRD (SUTURE) ×2 IMPLANT
TOWEL GREEN STERILE (TOWEL DISPOSABLE) ×3 IMPLANT
UNDERPAD 30X30 (UNDERPADS AND DIAPERS) ×3 IMPLANT
WATER STERILE IRR 1000ML POUR (IV SOLUTION) ×3 IMPLANT

## 2018-02-16 NOTE — Interval H&P Note (Signed)
History and Physical Interval Note:  02/16/2018 10:20 AM  William [name]  has presented today for surgery, with the diagnosis of end stage renal disease  The various methods of treatment have been discussed with the patient and family. After consideration of risks, benefits and other options for treatment, the patient has consented to  Procedure(s): BRACHIOBASILIC VEIN TRANSPOSITION SECOND STAGE (Left) as a surgical intervention .  The patient's history has been reviewed, patient examined, no change in status, stable for surgery.  I have reviewed the patient's chart and labs.  Questions were answered to the patient's satisfaction.     Adele Barthel

## 2018-02-16 NOTE — Anesthesia Procedure Notes (Signed)
Procedure Name: LMA Insertion Date/Time: 02/16/2018 11:17 AM Performed by: Neldon Newport, CRNA Pre-anesthesia Checklist: Patient identified, Emergency Drugs available, Suction available and Patient being monitored Patient Re-evaluated:Patient Re-evaluated prior to induction Oxygen Delivery Method: Circle System Utilized Preoxygenation: Pre-oxygenation with 100% oxygen Induction Type: IV induction Ventilation: Mask ventilation without difficulty LMA: LMA inserted LMA Size: 4.0 Number of attempts: 1 Placement Confirmation: positive ETCO2 Tube secured with: Tape Dental Injury: Teeth and Oropharynx as per pre-operative assessment

## 2018-02-16 NOTE — Transfer of Care (Signed)
Immediate Anesthesia Transfer of Care Note  Patient: William [name]  Procedure(s) Performed: BRACHIOBASILIC VEIN TRANSPOSITION SECOND STAGE (Left Arm Upper)  Patient Location: PACU  Anesthesia Type:General  Level of Consciousness: sedated  Airway & Oxygen Therapy: Patient Spontanous Breathing and Patient connected to nasal cannula oxygen  Post-op Assessment: Report given to RN, Post -op Vital signs reviewed and stable and Patient moving all extremities X 4  Post vital signs: Reviewed and stable  Last Vitals:  Vitals Value Taken Time  BP    Temp    Pulse 73 02/16/2018  1:52 PM  Resp    SpO2 99 % 02/16/2018  1:52 PM  Vitals shown include unvalidated device data.  Last Pain:  Vitals:   02/16/18 0843  TempSrc:   PainSc: 0-No pain         Complications: No apparent anesthesia complications

## 2018-02-16 NOTE — Anesthesia Preprocedure Evaluation (Addendum)
Anesthesia Evaluation  Patient identified by MRN, date of birth, ID band Patient awake    Reviewed: Allergy & Precautions, NPO status , Patient's Chart, lab work & pertinent test results, reviewed documented beta blocker date and time   Airway Mallampati: III  TM Distance: >3 FB Neck ROM: Full    Dental  (+) Missing,    Pulmonary neg pulmonary ROS,    Pulmonary exam normal breath sounds clear to auscultation       Cardiovascular hypertension, Pt. on medications and Pt. on home beta blockers +CHF  Normal cardiovascular exam Rhythm:Regular Rate:Normal  ECG: NSR, rate 78  ECHO: LV EF: 65% -   70%   Neuro/Psych negative neurological ROS  negative psych ROS   GI/Hepatic negative GI ROS, (+) Cirrhosis   ascites    ,   Endo/Other  diabetes, Insulin Dependent  Renal/GU ESRF and DialysisRenal diseaseOn HD T, R, Sat     Musculoskeletal negative musculoskeletal ROS (+)   Abdominal   Peds  Hematology  (+) anemia , Thrombocytopenia    Anesthesia Other Findings End stage renal disease  Reproductive/Obstetrics                            Anesthesia Physical Anesthesia Plan  ASA: IV  Anesthesia Plan: General   Post-op Pain Management:    Induction: Intravenous  PONV Risk Score and Plan: 2 and Ondansetron and Treatment may vary due to age or medical condition  Airway Management Planned: LMA  Additional Equipment:   Intra-op Plan:   Post-operative Plan: Extubation in OR  Informed Consent: I have reviewed the patients History and Physical, chart, labs and discussed the procedure including the risks, benefits and alternatives for the proposed anesthesia with the patient or authorized representative who has indicated his/her understanding and acceptance.   Dental advisory given  Plan Discussed with: CRNA  Anesthesia Plan Comments: (H and P and anesthesia plan discussed with patient's son.)        Anesthesia Quick Evaluation

## 2018-02-16 NOTE — Op Note (Signed)
OPERATIVE NOTE   PROCEDURE: left second stage basilic vein transposition (brachiobasilic arteriovenous fistula) placement with revision of anatomosis  PRE-OPERATIVE DIAGNOSIS: end stage renal disease  POST-OPERATIVE DIAGNOSIS: same as above   SURGEON: Adele Barthel, MD  ASSISTANT(S): [name], PAC   ANESTHESIA: general  ESTIMATED BLOOD LOSS: 200 cc  FINDING(S): 1.  Neointimal hyperplasia in distal fistula requiring shortening the fistula and new anatomosis 2.  Palpable thrill and radial pulse at end of case  SPECIMEN(S):  none  INDICATIONS:   William [name] is a 67 y.o. male who presents with end stage renal disease and successful first stage basilic vein transposition.  The patient is scheduled for left second stage basilic vein transposition.  The patient is aware the risks include but are not limited to: bleeding, infection, steal syndrome, nerve damage, ischemic monomelic neuropathy, failure to mature, and need for additional procedures.  The patient is aware of the risks of the procedure and elects to proceed forward.   DESCRIPTION: After full informed written consent was obtained from the patient, the patient was brought back to the operating room and placed supine upon the operating table.  Prior to induction, the patient received IV antibiotics.   After obtaining adequate anesthesia, the patient was then prepped and draped in the standard fashion for a left arm access procedure.  I turned my attention first to identifying the patient's brachiobasilic arteriovenous fistula.  Using SonoSite guidance, the location of this fistula was marked out on the skin.     I made an longitudinal incision over the fistula from its arterial anastomosis up to its axillary extent.  I carefully dissected the fistula away from its adjacent nerves.  Eventually the entirety of this fistula was mobilized. In the process of evaluating the distal fistula.  The thrill was extremely attenuated  despite tieing off multiple side branches.  I had concerns with neointimal hyperplasia.  I ligated the fistula just proximal to the prior anastomosis.  The fistula lumen demonstrated the anticipated neointimal hyperplasia,  I shortened the fistula by 2 cm.  The vein appeared adequate at this level.  I fully mobilized the vein up to the level of the axillary vein.  I distended the vein with saline flush and marked the anterior wall.  I clamped the vein proximally to keep it distended.  I marked the route for a curved fistula and determine the level the brachial artery needed to be dissected out.  I dissected out the brachial artery at this level proximally and distally.  I dissected a curvilinear route with the Gore tunneler.  I tied the fistula to the inner cannula and delivered the fistula through the metal tunnel.  I removed the metal tunnel, leaving the fistula in place.    I placed the brachial artery under tension proximally and distally with vessel loops.  I made an arteriotomy in the brachial artery with a 11-blade and extended it proximally and distally.  I sewed the basilic vein to the brachial artery in an end-to-side configuration with a running stitch of 6-0 Prolene.  I backbled both ends of the artery: no clot was noted.  I washed out the lumen with saline.  I completed the new anastomosis in the usual fashion.  I removed the proximal clamp and then dropped tension on the artery.  Immediately there was greatly improved thrill in the fistula.    There was a bleeding point in the axillary vein that appeared to be a sheared side branch.  This required repair with a 6-0 Prolene stitch.  I packed the entire incision with Avitene.  After applying some pressure, no further bleeding was evident.  I washed out the surgical site after waiting a few minutes, and there was no further bleeding.  The fascia was reapproximated with interrupted stitches of 3-0 Vicryl to eliminate some of the deep space.  The  superficial subcutaneous tissue was then reapproximated along the incision line with a running stitch of 3-0 Vicryl.  The skin was then reapproximated with staples.  The skin was then cleaned, dried, and dressed with a Coverall.  The patient tolerated this procedure well.    COMPLICATIONS: none  CONDITION: stable   Adele Barthel, MD, [facility] [place] Office: [phone] Pager: [phone]  02/16/2018, 1:47 PM

## 2018-02-17 ENCOUNTER — Encounter (HOSPITAL_COMMUNITY): Payer: Self-pay | Admitting: Vascular Surgery

## 2018-02-17 DIAGNOSIS — D509 Iron deficiency anemia, unspecified: Secondary | ICD-10-CM | POA: Diagnosis not present

## 2018-02-17 DIAGNOSIS — N186 End stage renal disease: Secondary | ICD-10-CM | POA: Diagnosis not present

## 2018-02-17 DIAGNOSIS — D631 Anemia in chronic kidney disease: Secondary | ICD-10-CM | POA: Diagnosis not present

## 2018-02-17 DIAGNOSIS — N2581 Secondary hyperparathyroidism of renal origin: Secondary | ICD-10-CM | POA: Diagnosis not present

## 2018-02-17 NOTE — Telephone Encounter (Signed)
Sharyn Lull from Dialysis center called to scheduled pt 2 week 03/04/18 @ 1:30  and 4 week appt.03/18/18 @ 1:00  Gave her the dates and times. Her contact number is [phone]

## 2018-02-17 NOTE — Anesthesia Postprocedure Evaluation (Signed)
Anesthesia Post Note  Patient: William [name]  Procedure(s) Performed: BRACHIOBASILIC VEIN TRANSPOSITION SECOND STAGE (Left Arm Upper)     Patient location during evaluation: PACU Anesthesia Type: MAC Level of consciousness: awake Pain management: pain level controlled Vital Signs Assessment: post-procedure vital signs reviewed and stable Respiratory status: spontaneous breathing, nonlabored ventilation, respiratory function stable and patient connected to nasal cannula oxygen Cardiovascular status: blood pressure returned to baseline and stable Postop Assessment: no apparent nausea or vomiting Anesthetic complications: no    Last Vitals:  Vitals:   02/16/18 1453 02/16/18 1455  BP: (!) 124/57   Pulse: 68   Resp: 12   Temp:  (!) 36.2 C  SpO2: 96%     Last Pain:  Vitals:   02/16/18 1455  TempSrc:   PainSc: 0-No pain                 Zamara Cozad P Verlaine Embry

## 2018-02-18 ENCOUNTER — Other Ambulatory Visit: Payer: Self-pay | Admitting: *Deleted

## 2018-02-18 ENCOUNTER — Encounter (HOSPITAL_COMMUNITY)
Admission: RE | Disposition: A | Discharge status: Home / Self Care | Payer: Self-pay | Source: Ambulatory Visit | Attending: Surgery

## 2018-02-18 ENCOUNTER — Ambulatory Visit (INDEPENDENT_AMBULATORY_CARE_PROVIDER_SITE_OTHER): Payer: Medicare Other | Admitting: Vascular Surgery

## 2018-02-18 ENCOUNTER — Telehealth: Payer: Self-pay | Admitting: *Deleted

## 2018-02-18 ENCOUNTER — Ambulatory Visit (HOSPITAL_COMMUNITY): Payer: Medicare Other | Admitting: Anesthesiology

## 2018-02-18 ENCOUNTER — Other Ambulatory Visit: Payer: Self-pay

## 2018-02-18 ENCOUNTER — Ambulatory Visit (HOSPITAL_COMMUNITY)
Admission: RE | Admit: 2018-02-18 | Discharge: 2018-02-18 | Disposition: A | Discharge status: Home / Self Care | Payer: Medicare Other | Source: Ambulatory Visit | Attending: Surgery | Admitting: Surgery

## 2018-02-18 ENCOUNTER — Encounter (HOSPITAL_COMMUNITY): Payer: Self-pay | Admitting: *Deleted

## 2018-02-18 ENCOUNTER — Encounter: Payer: Self-pay | Admitting: Vascular Surgery

## 2018-02-18 VITALS — BP 140/63 | HR 81 | Temp 98.8°F | Resp 16 | Ht 65.0 in | Wt 136.0 lb

## 2018-02-18 DIAGNOSIS — E1122 Type 2 diabetes mellitus with diabetic chronic kidney disease: Secondary | ICD-10-CM | POA: Insufficient documentation

## 2018-02-18 DIAGNOSIS — L7632 Postprocedural hematoma of skin and subcutaneous tissue following other procedure: Secondary | ICD-10-CM | POA: Diagnosis not present

## 2018-02-18 DIAGNOSIS — N186 End stage renal disease: Secondary | ICD-10-CM

## 2018-02-18 DIAGNOSIS — I509 Heart failure, unspecified: Secondary | ICD-10-CM | POA: Insufficient documentation

## 2018-02-18 DIAGNOSIS — X58XXXA Exposure to other specified factors, initial encounter: Secondary | ICD-10-CM | POA: Insufficient documentation

## 2018-02-18 DIAGNOSIS — I120 Hypertensive chronic kidney disease with stage 5 chronic kidney disease or end stage renal disease: Secondary | ICD-10-CM | POA: Diagnosis not present

## 2018-02-18 DIAGNOSIS — R188 Other ascites: Secondary | ICD-10-CM | POA: Insufficient documentation

## 2018-02-18 DIAGNOSIS — I132 Hypertensive heart and chronic kidney disease with heart failure and with stage 5 chronic kidney disease, or end stage renal disease: Secondary | ICD-10-CM | POA: Diagnosis not present

## 2018-02-18 DIAGNOSIS — D696 Thrombocytopenia, unspecified: Secondary | ICD-10-CM | POA: Insufficient documentation

## 2018-02-18 DIAGNOSIS — Z992 Dependence on renal dialysis: Secondary | ICD-10-CM

## 2018-02-18 DIAGNOSIS — K7460 Unspecified cirrhosis of liver: Secondary | ICD-10-CM | POA: Diagnosis not present

## 2018-02-18 DIAGNOSIS — T82838A Hemorrhage of vascular prosthetic devices, implants and grafts, initial encounter: Secondary | ICD-10-CM | POA: Insufficient documentation

## 2018-02-18 DIAGNOSIS — K219 Gastro-esophageal reflux disease without esophagitis: Secondary | ICD-10-CM | POA: Diagnosis not present

## 2018-02-18 DIAGNOSIS — Z794 Long term (current) use of insulin: Secondary | ICD-10-CM | POA: Diagnosis not present

## 2018-02-18 DIAGNOSIS — D631 Anemia in chronic kidney disease: Secondary | ICD-10-CM | POA: Diagnosis not present

## 2018-02-18 HISTORY — PX: I & D EXTREMITY: SHX5045

## 2018-02-18 LAB — POCT I-STAT 4, (NA,K, GLUC, HGB,HCT)
GLUCOSE: 264 mg/dL — AB (ref 70–99)
HEMATOCRIT: 25 % — AB (ref 39.0–52.0)
Hemoglobin: 8.5 g/dL — ABNORMAL LOW (ref 13.0–17.0)
POTASSIUM: 3.7 mmol/L (ref 3.5–5.1)
SODIUM: 132 mmol/L — AB (ref 135–145)

## 2018-02-18 LAB — GLUCOSE, CAPILLARY
GLUCOSE-CAPILLARY: 283 mg/dL — AB (ref 70–99)
Glucose-Capillary: 336 mg/dL — ABNORMAL HIGH (ref 70–99)
Glucose-Capillary: 153 mg/dL — ABNORMAL HIGH (ref 70–99)
Glucose-Capillary: 28 mg/dL — CL (ref 70–99)
Glucose-Capillary: 108 mg/dL — ABNORMAL HIGH (ref 70–99)

## 2018-02-18 LAB — HEMOGLOBIN A1C
Hgb A1c MFr Bld: 6.6 % — ABNORMAL HIGH (ref 4.8–5.6)
Mean Plasma Glucose: 142.72 mg/dL

## 2018-02-18 SURGERY — IRRIGATION AND DEBRIDEMENT EXTREMITY
Anesthesia: General | Laterality: Left

## 2018-02-18 MED ORDER — CEFAZOLIN SODIUM-DEXTROSE 2-4 GM/100ML-% IV SOLN
2.0000 g | INTRAVENOUS | Status: AC
  Administered 2018-02-18: 2 g via INTRAVENOUS
  Filled 2018-02-18: qty 100

## 2018-02-18 MED ORDER — INSULIN ASPART 100 UNIT/ML ~~LOC~~ SOLN
10.0000 [IU] | Freq: Once | SUBCUTANEOUS | Status: AC
  Administered 2018-02-18: 10 [IU] via SUBCUTANEOUS
  Filled 2018-02-18: qty 0.1

## 2018-02-18 MED ORDER — FENTANYL CITRATE (PF) 100 MCG/2ML IJ SOLN: 25.0000 ug | INTRAMUSCULAR | Status: DC | PRN

## 2018-02-18 MED ORDER — 0.9 % SODIUM CHLORIDE (POUR BTL) OPTIME
TOPICAL | Status: DC | PRN
  Administered 2018-02-18: 1000 mL

## 2018-02-18 MED ORDER — SODIUM CHLORIDE 0.9 % IV SOLN
INTRAVENOUS | Status: DC
  Administered 2018-02-18: 16:00:00 via INTRAVENOUS

## 2018-02-18 MED ORDER — HYDROMORPHONE HCL 1 MG/ML IJ SOLN: 0.2500 mg | INTRAMUSCULAR | Status: DC | PRN

## 2018-02-18 MED ORDER — OXYCODONE HCL 5 MG/5ML PO SOLN: 5.0000 mg | Freq: Once | ORAL | Status: DC | PRN

## 2018-02-18 MED ORDER — ONDANSETRON HCL 4 MG/2ML IJ SOLN: 4.0000 mg | Freq: Once | INTRAMUSCULAR | Status: DC | PRN

## 2018-02-18 MED ORDER — LIDOCAINE HCL (CARDIAC) PF 100 MG/5ML IV SOSY
PREFILLED_SYRINGE | INTRAVENOUS | Status: DC | PRN
  Administered 2018-02-18: 60 mg via INTRAVENOUS

## 2018-02-18 MED ORDER — DEXTROSE 50 % IV SOLN
1.0000 | Freq: Once | INTRAVENOUS | Status: AC
  Administered 2018-02-18: 50 mL via INTRAVENOUS
  Filled 2018-02-18: qty 50

## 2018-02-18 MED ORDER — PROPOFOL 10 MG/ML IV BOLUS
INTRAVENOUS | Status: DC | PRN
  Administered 2018-02-18: 110 mg via INTRAVENOUS

## 2018-02-18 MED ORDER — DEXTROSE 50 % IV SOLN
INTRAVENOUS | Status: AC
  Filled 2018-02-18: qty 50

## 2018-02-18 MED ORDER — PROMETHAZINE HCL 25 MG/ML IJ SOLN: 6.2500 mg | INTRAMUSCULAR | Status: DC | PRN

## 2018-02-18 MED ORDER — OXYCODONE HCL 5 MG PO TABS: 5.0000 mg | ORAL_TABLET | Freq: Once | ORAL | Status: DC | PRN

## 2018-02-18 MED ORDER — FENTANYL CITRATE (PF) 250 MCG/5ML IJ SOLN
INTRAMUSCULAR | Status: AC
  Filled 2018-02-18: qty 5

## 2018-02-18 MED ORDER — ONDANSETRON HCL 4 MG/2ML IJ SOLN
INTRAMUSCULAR | Status: DC | PRN
  Administered 2018-02-18: 4 mg via INTRAVENOUS

## 2018-02-18 SURGICAL SUPPLY — 44 items
ADH SKN CLS APL DERMABOND .7 (GAUZE/BANDAGES/DRESSINGS) ×3
BANDAGE ACE 4X5 VEL STRL LF (GAUZE/BANDAGES/DRESSINGS) IMPLANT
BANDAGE ACE 6X5 VEL STRL LF (GAUZE/BANDAGES/DRESSINGS) IMPLANT
BNDG GAUZE ELAST 4 BULKY (GAUZE/BANDAGES/DRESSINGS) IMPLANT
CANISTER SUCT 3000ML PPV (MISCELLANEOUS) ×3 IMPLANT
CLIP VESOCCLUDE MED 6/CT (CLIP) ×3 IMPLANT
CLIP VESOCCLUDE SM WIDE 6/CT (CLIP) ×3 IMPLANT
COVER SURGICAL LIGHT HANDLE (MISCELLANEOUS) ×3 IMPLANT
DERMABOND ADVANCED (GAUZE/BANDAGES/DRESSINGS) ×6
DERMABOND ADVANCED .7 DNX12 (GAUZE/BANDAGES/DRESSINGS) IMPLANT
DRAPE HALF SHEET 40X57 (DRAPES) IMPLANT
DRAPE U-SHAPE 76X120 STRL (DRAPES) IMPLANT
ELECT REM PT RETURN 9FT ADLT (ELECTROSURGICAL) ×3
ELECTRODE REM PT RTRN 9FT ADLT (ELECTROSURGICAL) ×1 IMPLANT
GAUZE SPONGE 4X4 12PLY STRL (GAUZE/BANDAGES/DRESSINGS) ×3 IMPLANT
GAUZE XEROFORM 5X9 LF (GAUZE/BANDAGES/DRESSINGS) IMPLANT
GLOVE BIO SURGEON STRL SZ 6.5 (GLOVE) ×1 IMPLANT
GLOVE BIO SURGEONS STRL SZ 6.5 (GLOVE) ×1
GLOVE BIOGEL PI IND STRL 7.0 (GLOVE) IMPLANT
GLOVE BIOGEL PI IND STRL 7.5 (GLOVE) ×1 IMPLANT
GLOVE BIOGEL PI INDICATOR 7.0 (GLOVE) ×6
GLOVE BIOGEL PI INDICATOR 7.5 (GLOVE) ×4
GLOVE SURG SS PI 7.5 STRL IVOR (GLOVE) ×3 IMPLANT
GOWN STRL REUS W/ TWL LRG LVL3 (GOWN DISPOSABLE) ×2 IMPLANT
GOWN STRL REUS W/ TWL XL LVL3 (GOWN DISPOSABLE) ×1 IMPLANT
GOWN STRL REUS W/TWL LRG LVL3 (GOWN DISPOSABLE) ×6
GOWN STRL REUS W/TWL XL LVL3 (GOWN DISPOSABLE) ×3
KIT BASIN OR (CUSTOM PROCEDURE TRAY) ×3 IMPLANT
KIT REMOVER STAPLE SKIN (MISCELLANEOUS) ×2 IMPLANT
KIT TURNOVER KIT B (KITS) ×3 IMPLANT
NS IRRIG 1000ML POUR BTL (IV SOLUTION) ×3 IMPLANT
PACK CV ACCESS (CUSTOM PROCEDURE TRAY) IMPLANT
PACK GENERAL/GYN (CUSTOM PROCEDURE TRAY) ×3 IMPLANT
PACK UNIVERSAL I (CUSTOM PROCEDURE TRAY) ×3 IMPLANT
PAD ARMBOARD 7.5X6 YLW CONV (MISCELLANEOUS) ×6 IMPLANT
SUT ETHILON 3 0 PS 1 (SUTURE) IMPLANT
SUT VIC AB 2-0 CT1 27 (SUTURE) ×3
SUT VIC AB 2-0 CT1 TAPERPNT 27 (SUTURE) IMPLANT
SUT VIC AB 2-0 CTX 36 (SUTURE) IMPLANT
SUT VIC AB 3-0 SH 27 (SUTURE) ×6
SUT VIC AB 3-0 SH 27X BRD (SUTURE) IMPLANT
SUT VICRYL 4-0 PS2 18IN ABS (SUTURE) IMPLANT
TOWEL GREEN STERILE (TOWEL DISPOSABLE) ×3 IMPLANT
WATER STERILE IRR 1000ML POUR (IV SOLUTION) ×3 IMPLANT

## 2018-02-18 NOTE — Anesthesia Preprocedure Evaluation (Addendum)
Anesthesia Evaluation  Patient identified by MRN, date of birth, ID band Patient awake    Reviewed: Allergy & Precautions, NPO status , Patient's Chart, lab work & pertinent test results, reviewed documented beta blocker date and time   History of Anesthesia Complications Negative for: history of anesthetic complications  Airway Mallampati: III  TM Distance: >3 FB Neck ROM: Full    Dental  (+) Missing,    Pulmonary neg pulmonary ROS,    Pulmonary exam normal breath sounds clear to auscultation       Cardiovascular hypertension, Pt. on medications and Pt. on home beta blockers +CHF  Normal cardiovascular exam Rhythm:Regular Rate:Normal  ECG: NSR, rate 78  ECHO: LV EF: 65% -   70%   Neuro/Psych negative neurological ROS  negative psych ROS   GI/Hepatic negative GI ROS, (+) Cirrhosis   ascites    ,   Endo/Other  diabetes, Insulin Dependent  Renal/GU ESRF and DialysisRenal diseaseOn HD T, R, Sat     Musculoskeletal negative musculoskeletal ROS (+)   Abdominal   Peds  Hematology  (+) anemia , Thrombocytopenia    Anesthesia Other Findings End stage renal disease  Reproductive/Obstetrics                            Anesthesia Physical  Anesthesia Plan  ASA: IV  Anesthesia Plan: MAC   Post-op Pain Management:    Induction: Intravenous  PONV Risk Score and Plan: 1 and Ondansetron and Treatment may vary due to age or medical condition  Airway Management Planned: Nasal Cannula  Additional Equipment: None  Intra-op Plan:   Post-operative Plan:   Informed Consent: I have reviewed the patients History and Physical, chart, labs and discussed the procedure including the risks, benefits and alternatives for the proposed anesthesia with the patient or authorized representative who has indicated his/her understanding and acceptance.   Dental advisory given  Plan Discussed with:  CRNA  Anesthesia Plan Comments: (H and P and anesthesia plan discussed with patient's son.)       Anesthesia Quick Evaluation

## 2018-02-18 NOTE — Op Note (Signed)
    Patient name: Piercen Covino MRN: [id number] DOB: [birth year] Sex: male  02/18/2018 Pre-operative Diagnosis: Bleeding from left basilic vein fistula Post-operative diagnosis:  Same Surgeon:  Annamarie Major Assistants: Luanne Bras Procedure:   Incision and drainage of left basilic vein fistula Anesthesia: General Blood Loss: Minimal Specimens: None  Findings: No obvious hematoma was identified.  Patient had skin edge bleeding.  Indications: Patient was seen in the office earlier today by Dr. Bridgett Larsson.  He was having bleeding at dialysis from his recently performed second stage basilic vein fistula creation.  He was scheduled for evacuation of hematoma.  Procedure:  The patient was identified in the holding area and taken to [place] [facility]  The patient was then placed supine on the table. general anesthesia was administered.  The patient was prepped and draped in the usual sterile fashion.  A time out was called and antibiotics were administered.  The staples from the patient's incision were removed with a hemostat.  I then removed the suture for the subcutaneous closure.  There was no obvious hematoma.  Patient did have some serous drainage from the wound bed.  Cautery was used to get hemostasis from the skin edges.  The wound was then irrigated.  There was an excellent thrill within the fistula.  I then reapproximated the subcutaneous tissue with 2-0 Vicryl and closed the skin with 3-0 Vicryl.  Dermabond was applied.  There were no immediate complications.   Disposition: To PACU stable   V. Annamarie Major, M.D. [facility] [place] Office: [phone] Pager:  [phone]

## 2018-02-18 NOTE — H&P (View-Only) (Signed)
    Postoperative Access Visit   History of Present Illness   Rune Mendez is a 67 y.o. year old male who presents for postoperative follow-up for: left second stage basilic vein transposition (Date: 02/16/18).  The patient started bleeding from staple line after HD yesterday.   Physical Examination   Vitals:   02/18/18 1349  BP: 140/63  Pulse: 81  Resp: 16  Temp: 98.8 F (37.1 C)  TempSrc: Oral  SpO2: 100%  Weight: 136 lb (61.7 kg)  Height: 5' 5"  (1.651 m)   L arm: some swelling in whole arm.  Staples in place, bleeding between staples   Medical Decision Making   Ibraheem Voris is a 67 y.o. year old male who presents s/p L 2nd stage BVT   Suspect pt is decompressing a L arm hematoma.  The arm is not markedly enlarged, which is what would be seen from an active arterial bleed.  Due to pt's co-morbidities and patient's family's inability to care for the patient's complications, would proceed with L arm hematoma evacuation and washout.    I offered to do the procedure tomorrow, but patient's family prefers to have it done today.  Risks include but are not limited to bleeding, infection, and need for additional procedures.  Thank you for allowing [place] to participate in this patient's care.   Adele Barthel, MD, FACS [facility] [place] Office: [phone] Pager: [phone]

## 2018-02-18 NOTE — Anesthesia Procedure Notes (Signed)
Procedure Name: LMA Insertion Date/Time: 02/18/2018 7:31 PM Performed by: Eligha Bridegroom, CRNA Pre-anesthesia Checklist: Patient identified, Emergency Drugs available, Suction available, Patient being monitored and Timeout performed Patient Re-evaluated:Patient Re-evaluated prior to induction Oxygen Delivery Method: Circle system utilized Preoxygenation: Pre-oxygenation with 100% oxygen Induction Type: IV induction LMA: LMA inserted LMA Size: 4.0 Number of attempts: 1 Placement Confirmation: positive ETCO2 and breath sounds checked- equal and bilateral Tube secured with: Tape Dental Injury: Teeth and Oropharynx as per pre-operative assessment

## 2018-02-18 NOTE — Interval H&P Note (Signed)
History and Physical Interval Note:  02/18/2018 5:06 PM  William [name]  has presented today for surgery, with the diagnosis of postoperative hematoma  The various methods of treatment have been discussed with the patient and family. After consideration of risks, benefits and other options for treatment, the patient has consented to  Procedure(s): IRRIGATION AND DEBRIDEMENT LEFT ARM EVACUATION OF HEMATOMA (Left) as a surgical intervention .  The patient's history has been reviewed, patient examined, no change in status, stable for surgery.  I have reviewed the patient's chart and labs.  Questions were answered to the patient's satisfaction.     Annamarie Major

## 2018-02-18 NOTE — Transfer of Care (Signed)
Immediate Anesthesia Transfer of Care Note  Patient: William [name]  Procedure(s) Performed: IRRIGATION AND DEBRIDEMENT LEFT ARM (Left )  Patient Location: PACU  Anesthesia Type:General  Level of Consciousness: awake and alert   Airway & Oxygen Therapy: Patient Spontanous Breathing  Post-op Assessment: Report given to RN and Post -op Vital signs reviewed and stable  Post vital signs: Reviewed and stable  Last Vitals:  Vitals Value Taken Time  BP    Temp    Pulse 70 02/18/2018  8:25 PM  Resp 7 02/18/2018  8:25 PM  SpO2 100 % 02/18/2018  8:25 PM  Vitals shown include unvalidated device data.  Last Pain:  Vitals:   02/18/18 1543  TempSrc:   PainSc: 0-No pain      Patients Stated Pain Goal: 3 (43/14/27 6701)  Complications: No apparent anesthesia complications

## 2018-02-18 NOTE — Telephone Encounter (Signed)
Call from patient's family c/o significant bleeding x 2 days post op that has covered dressing and onto clothes. C/ O swelling in arm and instructed to elevate above level of heart. Due to language barrier thought best to bring patient in to see Dr. Bridgett Larsson. Appt time given.

## 2018-02-18 NOTE — Progress Notes (Signed)
    Postoperative Access Visit   History of Present Illness   William [name] is a 67 y.o. year old male who presents for postoperative follow-up for: left second stage basilic vein transposition (Date: 02/16/18).  The patient started bleeding from staple line after HD yesterday.   Physical Examination   Vitals:   02/18/18 1349  BP: 140/63  Pulse: 81  Resp: 16  Temp: 98.8 F (37.1 C)  TempSrc: Oral  SpO2: 100%  Weight: 136 lb (61.7 kg)  Height: 5' 5"  (1.651 m)   L arm: some swelling in whole arm.  Staples in place, bleeding between staples   Medical Decision Making   [name] is a 66 y.o. year old male who presents s/p L 2nd stage BVT   Suspect pt is decompressing a L arm hematoma.  The arm is not markedly enlarged, which is what would be seen from an active arterial bleed.  Due to pt's co-morbidities and patient's family's inability to care for the patient's complications, would proceed with L arm hematoma evacuation and washout.    I offered to do the procedure tomorrow, but patient's family prefers to have it done today.  Risks include but are not limited to bleeding, infection, and need for additional procedures.  Thank you for allowing [place] to participate in this patient's care.   Adele Barthel, MD, FACS [facility] Office: [phone] Pager: [phone]

## 2018-02-18 NOTE — Progress Notes (Signed)
Hypoglycemic Event  CBG: 28  Treatment: D50 IV 50 mL  Symptoms: None  Follow-up CBG: [id number] CBG Result: 153   Possible Reasons for Event: Inadequate meal intake  Comments/MD notified: Dr. Ermalene Postin made aware, ordered one amp of dextrose 50, orders received and carried out    Schonewitz, Eulis Canner

## 2018-02-19 ENCOUNTER — Encounter (HOSPITAL_COMMUNITY): Payer: Self-pay | Admitting: Surgery

## 2018-02-19 DIAGNOSIS — N186 End stage renal disease: Secondary | ICD-10-CM | POA: Diagnosis not present

## 2018-02-19 DIAGNOSIS — N2581 Secondary hyperparathyroidism of renal origin: Secondary | ICD-10-CM | POA: Diagnosis not present

## 2018-02-19 DIAGNOSIS — D509 Iron deficiency anemia, unspecified: Secondary | ICD-10-CM | POA: Diagnosis not present

## 2018-02-19 DIAGNOSIS — D631 Anemia in chronic kidney disease: Secondary | ICD-10-CM | POA: Diagnosis not present

## 2018-02-19 NOTE — Anesthesia Postprocedure Evaluation (Signed)
Anesthesia Post Note  Patient: William [name]  Procedure(s) Performed: IRRIGATION AND DEBRIDEMENT LEFT ARM (Left )     Patient location during evaluation: PACU Anesthesia Type: General Level of consciousness: awake and alert Pain management: pain level controlled Vital Signs Assessment: post-procedure vital signs reviewed and stable Respiratory status: spontaneous breathing, nonlabored ventilation, respiratory function stable and patient connected to nasal cannula oxygen Cardiovascular status: blood pressure returned to baseline and stable Postop Assessment: no apparent nausea or vomiting Anesthetic complications: no    Last Vitals:  Vitals:   02/18/18 2045 02/18/18 2100  BP: (!) 136/59 140/63  Pulse: 73 74  Resp: 15 17  Temp:  36.5 C  SpO2: 100%     Last Pain:  Vitals:   02/18/18 2100  TempSrc:   PainSc: 0-No pain                 [name]

## 2018-02-21 DIAGNOSIS — D631 Anemia in chronic kidney disease: Secondary | ICD-10-CM | POA: Diagnosis not present

## 2018-02-21 DIAGNOSIS — N186 End stage renal disease: Secondary | ICD-10-CM | POA: Diagnosis not present

## 2018-02-21 DIAGNOSIS — N2581 Secondary hyperparathyroidism of renal origin: Secondary | ICD-10-CM | POA: Diagnosis not present

## 2018-02-21 DIAGNOSIS — D509 Iron deficiency anemia, unspecified: Secondary | ICD-10-CM | POA: Diagnosis not present

## 2018-02-24 ENCOUNTER — Inpatient Hospital Stay: Payer: Medicare Other | Attending: Internal Medicine | Admitting: Internal Medicine

## 2018-02-24 ENCOUNTER — Inpatient Hospital Stay: Payer: Medicare Other

## 2018-02-24 DIAGNOSIS — N2581 Secondary hyperparathyroidism of renal origin: Secondary | ICD-10-CM | POA: Diagnosis not present

## 2018-02-24 DIAGNOSIS — N186 End stage renal disease: Secondary | ICD-10-CM | POA: Diagnosis not present

## 2018-02-24 DIAGNOSIS — D631 Anemia in chronic kidney disease: Secondary | ICD-10-CM | POA: Diagnosis not present

## 2018-02-24 DIAGNOSIS — D509 Iron deficiency anemia, unspecified: Secondary | ICD-10-CM | POA: Diagnosis not present

## 2018-02-25 ENCOUNTER — Encounter (HOSPITAL_COMMUNITY): Payer: Medicaid Other

## 2018-02-26 DIAGNOSIS — E1129 Type 2 diabetes mellitus with other diabetic kidney complication: Secondary | ICD-10-CM | POA: Diagnosis not present

## 2018-02-26 DIAGNOSIS — D631 Anemia in chronic kidney disease: Secondary | ICD-10-CM | POA: Diagnosis not present

## 2018-02-26 DIAGNOSIS — D509 Iron deficiency anemia, unspecified: Secondary | ICD-10-CM | POA: Diagnosis not present

## 2018-02-26 DIAGNOSIS — N2581 Secondary hyperparathyroidism of renal origin: Secondary | ICD-10-CM | POA: Diagnosis not present

## 2018-02-26 DIAGNOSIS — N186 End stage renal disease: Secondary | ICD-10-CM | POA: Diagnosis not present

## 2018-02-28 DIAGNOSIS — N186 End stage renal disease: Secondary | ICD-10-CM | POA: Diagnosis not present

## 2018-02-28 DIAGNOSIS — D509 Iron deficiency anemia, unspecified: Secondary | ICD-10-CM | POA: Diagnosis not present

## 2018-02-28 DIAGNOSIS — D631 Anemia in chronic kidney disease: Secondary | ICD-10-CM | POA: Diagnosis not present

## 2018-02-28 DIAGNOSIS — N2581 Secondary hyperparathyroidism of renal origin: Secondary | ICD-10-CM | POA: Diagnosis not present

## 2018-03-03 ENCOUNTER — Telehealth: Payer: Self-pay | Admitting: Family Medicine

## 2018-03-03 DIAGNOSIS — D509 Iron deficiency anemia, unspecified: Secondary | ICD-10-CM | POA: Diagnosis not present

## 2018-03-03 DIAGNOSIS — N186 End stage renal disease: Secondary | ICD-10-CM | POA: Diagnosis not present

## 2018-03-03 DIAGNOSIS — D631 Anemia in chronic kidney disease: Secondary | ICD-10-CM | POA: Diagnosis not present

## 2018-03-03 DIAGNOSIS — N2581 Secondary hyperparathyroidism of renal origin: Secondary | ICD-10-CM | POA: Diagnosis not present

## 2018-03-03 NOTE — Telephone Encounter (Signed)
Called translator services and left VM with pt and asked him to call the office and reschedule his appt due to Dr. Nolon Rod being sick and out of the office. When pt calls back in, please reschedule him at his convenience.  Thank you!

## 2018-03-04 ENCOUNTER — Ambulatory Visit: Payer: Self-pay | Admitting: Family Medicine

## 2018-03-04 ENCOUNTER — Ambulatory Visit (INDEPENDENT_AMBULATORY_CARE_PROVIDER_SITE_OTHER): Payer: Self-pay | Admitting: Physician Assistant

## 2018-03-04 VITALS — BP 135/67 | HR 81 | Temp 97.0°F | Resp 16 | Ht 65.0 in | Wt 138.0 lb

## 2018-03-04 DIAGNOSIS — N186 End stage renal disease: Secondary | ICD-10-CM | POA: Diagnosis not present

## 2018-03-04 DIAGNOSIS — E1122 Type 2 diabetes mellitus with diabetic chronic kidney disease: Secondary | ICD-10-CM | POA: Diagnosis not present

## 2018-03-04 DIAGNOSIS — Z992 Dependence on renal dialysis: Secondary | ICD-10-CM

## 2018-03-04 NOTE — Progress Notes (Signed)
  POST OPERATIVE OFFICE NOTE    CC:  F/u for surgery  HPI:  This is a 67 y.o. male who is s/p left first stage basilic vein transposition (brachiobasilic arteriovenous fistula) placement 12/26/2017.  Right TDC placement on 01/19/2018 by Dr. Donnetta Hutching.  Left second stage basilic vein transposition (brachiobasilic arteriovenous fistula) placement with revision of anastomosis 02/16/2018 by Dr. Bridgett Larsson.  He developed bleeding and hematoma at the incision between the staple.  He was taken to the OR by Dr. Trula Slade 02/18/2018 for irrigation and drainage of the left basilic vein fistula.   Findings: No obvious hematoma was identified.  Patient had skin edge bleeding.   He is here today for a f/u exam.  He denise numbness, pain and loss of motor in his left UE.  He denise fever and chills.       Allergies  Allergen Reactions  . Heparin     Patient is Muslim and is not permitted Pork derative due to religious belief)  . Pork-Derived Products     Patient does not eat pork due to religious beliefs    Current Outpatient Medications  Medication Sig Dispense Refill  . acetaminophen (TYLENOL) 650 MG CR tablet Take 650 mg by mouth every 8 (eight) hours as needed for pain.    Marland Kitchen amLODipine (NORVASC) 10 MG tablet Take 1 tablet (10 mg total) daily by mouth. 90 tablet 0  . calcium acetate (PHOSLO) 667 MG capsule Take 2 capsules (1,334 mg total) by mouth 3 (three) times daily with meals. 90 capsule 1  . carvedilol (COREG) 12.5 MG tablet Take 12.5 mg by mouth 2 (two) times daily with a meal.    . ferrous gluconate (IRON 27) 240 (27 FE) MG tablet Take 240 mg by mouth daily.     . insulin NPH-regular Human (NOVOLIN 70/30) (70-30) 100 UNIT/ML injection Inject 18 Units into the skin 2 (two) times daily with a meal. 10 mL 1  . lactulose (CHRONULAC) 10 GM/15ML solution Take 30 mLs (20 g total) by mouth 2 (two) times daily. 240 mL 1  . Multiple Vitamins-Minerals (MULTIVITAMIN WITH MINERALS) tablet Take 1 tablet by mouth daily.      No current facility-administered medications for this visit.      ROS:  See HPI  Physical Exam:  Vitals:   03/04/18 1353  BP: 135/67  Pulse: 81  Resp: 16  Temp: (!) 97 F (36.1 C)  SpO2: 98%    Incision:  Well healing with minimal edema.  No hematoma and no active bleeding. Extremities:  Palpable radial pulse, palpable thrill throughout the basilic fistula.  Sensation and motor intact and equal B UE. Heart: RRR Abdomen:  + BS Lungs non labored breathing CTA   Assessment/Plan:  This is a 67 y.o. male who is s/p: Basilic fistula creation with transposition.  No signs of recurrent bleeding or infection.  No symptoms of steal.  The fistula can be stuck in 2 weeks If successful the catheter can be removed.  F/U PRN.       Roxy Horseman , PA-C [facility] [phone]

## 2018-03-05 DIAGNOSIS — D509 Iron deficiency anemia, unspecified: Secondary | ICD-10-CM | POA: Diagnosis not present

## 2018-03-05 DIAGNOSIS — Z992 Dependence on renal dialysis: Secondary | ICD-10-CM | POA: Diagnosis not present

## 2018-03-05 DIAGNOSIS — N2581 Secondary hyperparathyroidism of renal origin: Secondary | ICD-10-CM | POA: Diagnosis not present

## 2018-03-05 DIAGNOSIS — D631 Anemia in chronic kidney disease: Secondary | ICD-10-CM | POA: Diagnosis not present

## 2018-03-05 DIAGNOSIS — N186 End stage renal disease: Secondary | ICD-10-CM | POA: Diagnosis not present

## 2018-03-05 DIAGNOSIS — E1122 Type 2 diabetes mellitus with diabetic chronic kidney disease: Secondary | ICD-10-CM | POA: Diagnosis not present

## 2018-03-07 DIAGNOSIS — D631 Anemia in chronic kidney disease: Secondary | ICD-10-CM | POA: Diagnosis not present

## 2018-03-07 DIAGNOSIS — N186 End stage renal disease: Secondary | ICD-10-CM | POA: Diagnosis not present

## 2018-03-07 DIAGNOSIS — N2581 Secondary hyperparathyroidism of renal origin: Secondary | ICD-10-CM | POA: Diagnosis not present

## 2018-03-07 DIAGNOSIS — D509 Iron deficiency anemia, unspecified: Secondary | ICD-10-CM | POA: Diagnosis not present

## 2018-03-09 ENCOUNTER — Ambulatory Visit (INDEPENDENT_AMBULATORY_CARE_PROVIDER_SITE_OTHER): Payer: Medicare Other | Admitting: Emergency Medicine

## 2018-03-09 ENCOUNTER — Encounter: Payer: Self-pay | Admitting: Emergency Medicine

## 2018-03-09 ENCOUNTER — Other Ambulatory Visit: Payer: Self-pay

## 2018-03-09 VITALS — BP 140/59 | HR 81 | Temp 98.3°F | Resp 16 | Ht 65.0 in | Wt 136.2 lb

## 2018-03-09 DIAGNOSIS — I5033 Acute on chronic diastolic (congestive) heart failure: Secondary | ICD-10-CM | POA: Diagnosis not present

## 2018-03-09 DIAGNOSIS — N186 End stage renal disease: Secondary | ICD-10-CM | POA: Diagnosis not present

## 2018-03-09 DIAGNOSIS — K7460 Unspecified cirrhosis of liver: Secondary | ICD-10-CM | POA: Diagnosis not present

## 2018-03-09 DIAGNOSIS — E1122 Type 2 diabetes mellitus with diabetic chronic kidney disease: Secondary | ICD-10-CM | POA: Diagnosis not present

## 2018-03-09 DIAGNOSIS — R188 Other ascites: Secondary | ICD-10-CM

## 2018-03-09 DIAGNOSIS — Z794 Long term (current) use of insulin: Secondary | ICD-10-CM

## 2018-03-09 DIAGNOSIS — I10 Essential (primary) hypertension: Secondary | ICD-10-CM

## 2018-03-09 DIAGNOSIS — Z992 Dependence on renal dialysis: Secondary | ICD-10-CM

## 2018-03-09 NOTE — Assessment & Plan Note (Signed)
Dialysis 3 times a week.  Compliant.  No signs of volume overload.

## 2018-03-09 NOTE — Assessment & Plan Note (Signed)
Under control.  No change of medication.

## 2018-03-09 NOTE — Patient Instructions (Addendum)
IF you received an x-ray today, you will receive an invoice from [facility]. Please contact [facility] at [phone] with questions or concerns regarding your invoice.   IF you received labwork today, you will receive an invoice from [facility]. Please contact [facility] at [phone] with questions or concerns regarding your invoice.   Our billing staff will not be able to assist you with questions regarding bills from these companies.  You will be contacted with the lab results as soon as they are available. The fastest way to get your results is to activate your My Chart account. Instructions are located on the last page of this paperwork. If you have not heard from [place] regarding the results in 2 weeks, please contact this office.     Cirrhosis Cirrhosis is long-term (chronic) liver injury. The liver is your largest internal organ, and it performs many functions. The liver converts food into energy, removes toxic material from your blood, makes important proteins, and absorbs necessary vitamins from your diet. If you have cirrhosis, it means many of your healthy liver cells have been replaced by scar tissue. This prevents blood from flowing through your liver, which makes it difficult for your liver to function. This scarring is not reversible, but treatment can prevent it from getting worse. What are the causes? Hepatitis C and long-term alcohol abuse are the most common causes of cirrhosis. Other causes include:  Nonalcoholic fatty liver disease.  Hepatitis B infection.  Autoimmune hepatitis.  Diseases that cause blockage of ducts inside the liver.  Inherited liver diseases.  Reactions to certain long-term medicines.  Parasitic infections.  Long-term exposure to certain toxins.  What increases the risk? You may have a higher risk of cirrhosis if you:  Have certain hepatitis viruses.  Abuse alcohol, especially if you are male.  Are  overweight.  Share needles.  Have unprotected sex with someone who has hepatitis.  What are the signs or symptoms? You may not have any signs and symptoms at first. Symptoms may not develop until the damage to your liver starts to get worse. Signs and symptoms of cirrhosis may include:  Tenderness in the right-upper part of your abdomen.  Weakness and tiredness (fatigue).  Loss of appetite.  Nausea.  Weight loss and muscle loss.  Itchiness.  Yellow skin and eyes (jaundice).  Buildup of fluid in the abdomen (ascites).  Swelling of the feet and ankles (edema).  Appearance of tiny blood vessels under the skin.  Mental confusion.  Easy bruising and bleeding.  How is this diagnosed? Your health care provider may suspect cirrhosis based on your symptoms and medical history, especially if you have other medical conditions or a history of alcohol abuse. Your health care provider will do a physical exam to feel your liver and check for signs of cirrhosis. Your health care provider may perform other tests, including:  Blood tests to check: ? Whether you have hepatitis B or C. ? Kidney function. ? Liver function.  Imaging tests such as: ? MRI or CT scan to look for changes seen in advanced cirrhosis. ? Ultrasound to see if normal liver tissue is being replaced by scar tissue.  A procedure using a long needle to take a sample of liver tissue (biopsy) for examination under a microscope. Liver biopsy can confirm the diagnosis of cirrhosis.  How is this treated? Treatment depends on how damaged your liver is and what caused the damage. Treatment may include treating cirrhosis symptoms or treating the  underlying causes of the condition to try to slow the progression of the damage. Treatment may include:  Making lifestyle changes, such as: ? Eating a healthy diet. ? Restricting salt intake. ? Maintaining a healthy weight. ? Not abusing drugs or alcohol.  Taking medicines  to: ? Treat liver infections or other infections. ? Control itching. ? Reduce fluid buildup. ? Reduce certain blood toxins. ? Reduce risk of bleeding from enlarged blood vessels in the stomach or esophagus (varices).  If varices are causing bleeding problems, you may need treatment with a procedure that ties up the vessels causing them to fall off (band ligation).  If cirrhosis is causing your liver to fail, your health care provider may recommend a liver transplant.  Other treatments may be recommended depending on any complications of cirrhosis, such as liver-related kidney failure (hepatorenal syndrome).  Follow these instructions at home:  Take medicines only as directed by your health care provider. Do not use drugs that are toxic to your liver. Ask your health care provider before taking any new medicines, including over-the-counter medicines.  Rest as needed.  Eat a well-balanced diet. Ask your health care provider or dietitian for more information.  You may have to follow a low-salt diet or restrict your water intake as directed.  Do not drink alcohol. This is especially important if you are taking acetaminophen.  Keep all follow-up visits as directed by your health care provider. This is important. Contact a health care provider if:  You have fatigue or weakness that is getting worse.  You develop swelling of the hands, feet, legs, or face.  You have a fever.  You develop loss of appetite.  You have nausea or vomiting.  You develop jaundice.  You develop easy bruising or bleeding. Get help right away if:  You vomit bright red blood or a material that looks like coffee grounds.  You have blood in your stools.  Your stools appear black and tarry.  You become confused.  You have chest pain or trouble breathing. This information is not intended to replace advice given to you by your health care provider. Make sure you discuss any questions you have with your  health care provider. Document Released: 07/22/2005 Document Revised: 11/30/2015 Document Reviewed: 03/30/2014 Elsevier Interactive Patient Education  2018 [employer].  Dialysis Diet Dialysis is a treatment that cleans your blood. It is used when the kidneys are damaged. When you need dialysis, you should watch your diet. This is because some nutrients can build up in your blood between treatments and make you sick. These nutrients are:  Potassium.  Phosphorus.  Sodium.  Your doctor or dietitian will:  Tell you how much of these you can have.  Tell you if you need to look out for other nutrients too.  Help you plan meals.  Tell you how much to drink each day.  What do I need to know about this diet?  Limit potassium. Potassium is in milk, fruits, and vegetables.  Limit phosphorus. Phosphorus is in milk, cheese, beans, nuts, and carbonated beverages.  Limit salt (sodium). Foods that have a lot sodium include processed and cured meats, ready-made frozen meals, canned vegetables, and salty snack foods.  Do not use salt substitutes.  Try not to eat whole-grain foods and foods that have a lot of fiber.  Follow your doctor's instructions about how much to drink. You may be told to: ? Write down what you drink. ? Write down foods you eat that are  made mostly from water, such as gelatin and soups. ? Drink from small cups.  Ask your doctor if you should take a medicine that binds phosphorus.  Take vitamin and mineral supplements only as told by your doctor.  Eat meat, poultry, fish, and eggs. Limit nuts and beans.  Before you cook potatoes, cut them into small pieces. Then boil them in unsalted water.  Drain all fluid from cooked vegetables and canned fruits before you eat them. What foods can I eat? Grains White bread. White rice. Cooked cereal. Unsalted popcorn. Tortillas. Pasta. Vegetables Fresh or frozen broccoli, carrots, and green beans. Cabbage. Cauliflower.  Celery. Cucumbers. Eggplant. Radishes. Zucchini. Fruits Apples. Fresh or frozen berries. Fresh or canned pears, peaches, and pineapple. Grapes. Plums. Meats and Other Protein Sources Fresh or frozen beef, pork, chicken, and fish. Eggs. Low-sodium canned tuna or salmon. Dairy Cream cheese. Heavy cream. Ricotta cheese. Beverages Apple cider. Cranberry juice. Grape juice. Lemonade. Black coffee. Condiments Herbs. Spices. Jam and jelly. Honey. Sweets and Desserts Sherbet. Cakes. Cookies. Fats and Oils Olive oil, canola oil, and safflower oil. Other Non-dairy creamer. Non-dairy whipped topping. Homemade broth without salt. The items listed above may not be a complete list of recommended foods or beverages. Contact your dietitian for more options. What foods are not recommended? Grains Whole-grain bread. Whole-grain pasta. High-fiber cereal. Vegetables Potatoes. Beets. Tomatoes. Winter squash and pumpkin. Asparagus. Spinach. Parsnips. Fruits Star fruit. Bananas. Oranges. Kiwi. Nectarines. Prunes. Melon. Dried fruit. Avocado. Meats and Other Protein Sources Canned, smoked, and cured meats. Soil scientist. Sardines. Nuts and seeds. Peanut butter. Beans and legumes. Dairy Milk. Buttermilk. Yogurt. Cheese and cottage cheese. Processed cheese spreads. Beverages Orange juice. Prune juice. Carbonated soft drinks. Condiments Salt. Salt substitutes. Soy sauce. Sweets and Desserts Ice cream. Chocolate. Candied nuts. Fats and Oils Butter. Margarine. Other Ready-made frozen meals. Canned soups. The items listed above may not be a complete list of foods and beverages to avoid. Contact your dietitian for more information. This information is not intended to replace advice given to you by your health care provider. Make sure you discuss any questions you have with your health care provider. Document Released: 01/21/2012 Document Revised: 12/28/2015 Document Reviewed: 02/22/2014 Elsevier  Interactive Patient Education  2018 [employer].  Diabetes Mellitus and Nutrition When you have diabetes (diabetes mellitus), it is very important to have healthy eating habits because your blood sugar (glucose) levels are greatly affected by what you eat and drink. Eating healthy foods in the appropriate amounts, at about the same times every day, can help you:  Control your blood glucose.  Lower your risk of heart disease.  Improve your blood pressure.  Reach or maintain a healthy weight.  Every person with diabetes is different, and each person has different needs for a meal plan. Your health care provider may recommend that you work with a diet and nutrition specialist (dietitian) to make a meal plan that is best for you. Your meal plan may vary depending on factors such as:  The calories you need.  The medicines you take.  Your weight.  Your blood glucose, blood pressure, and cholesterol levels.  Your activity level.  Other health conditions you have, such as heart or kidney disease.  How do carbohydrates affect me? Carbohydrates affect your blood glucose level more than any other type of food. Eating carbohydrates naturally increases the amount of glucose in your blood. Carbohydrate counting is a method for keeping track of how many carbohydrates you eat. Counting  carbohydrates is important to keep your blood glucose at a healthy level, especially if you use insulin or take certain oral diabetes medicines. It is important to know how many carbohydrates you can safely have in each meal. This is different for every person. Your dietitian can help you calculate how many carbohydrates you should have at each meal and for snack. Foods that contain carbohydrates include:  Bread, cereal, rice, pasta, and crackers.  Potatoes and corn.  Peas, beans, and lentils.  Milk and yogurt.  Fruit and juice.  Desserts, such as cakes, cookies, ice cream, and candy.  How does alcohol  affect me? Alcohol can cause a sudden decrease in blood glucose (hypoglycemia), especially if you use insulin or take certain oral diabetes medicines. Hypoglycemia can be a life-threatening condition. Symptoms of hypoglycemia (sleepiness, dizziness, and confusion) are similar to symptoms of having too much alcohol. If your health care provider says that alcohol is safe for you, follow these guidelines:  Limit alcohol intake to no more than 1 drink per day for nonpregnant women and 2 drinks per day for men. One drink equals 12 oz of beer, 5 oz of wine, or 1 oz of hard liquor.  Do not drink on an empty stomach.  Keep yourself hydrated with water, diet soda, or unsweetened iced tea.  Keep in mind that regular soda, juice, and other mixers may contain a lot of sugar and must be counted as carbohydrates.  What are tips for following this plan? Reading food labels  Start by checking the serving size on the label. The amount of calories, carbohydrates, fats, and other nutrients listed on the label are based on one serving of the food. Many foods contain more than one serving per package.  Check the total grams (g) of carbohydrates in one serving. You can calculate the number of servings of carbohydrates in one serving by dividing the total carbohydrates by 15. For example, if a food has 30 g of total carbohydrates, it would be equal to 2 servings of carbohydrates.  Check the number of grams (g) of saturated and trans fats in one serving. Choose foods that have low or no amount of these fats.  Check the number of milligrams (mg) of sodium in one serving. Most people should limit total sodium intake to less than 2,300 mg per day.  Always check the nutrition information of foods labeled as "low-fat" or "nonfat". These foods may be higher in added sugar or refined carbohydrates and should be avoided.  Talk to your dietitian to identify your daily goals for nutrients listed on the  label. Shopping  Avoid buying canned, premade, or processed foods. These foods tend to be high in fat, sodium, and added sugar.  Shop around the outside edge of the grocery store. This includes fresh fruits and vegetables, bulk grains, fresh meats, and fresh dairy. Cooking  Use low-heat cooking methods, such as baking, instead of high-heat cooking methods like deep frying.  Cook using healthy oils, such as olive, canola, or sunflower oil.  Avoid cooking with butter, cream, or high-fat meats. Meal planning  Eat meals and snacks regularly, preferably at the same times every day. Avoid going long periods of time without eating.  Eat foods high in fiber, such as fresh fruits, vegetables, beans, and whole grains. Talk to your dietitian about how many servings of carbohydrates you can eat at each meal.  Eat 4-6 ounces of lean protein each day, such as lean meat, chicken, fish, eggs, or tofu.  1 ounce is equal to 1 ounce of meat, chicken, or fish, 1 egg, or 1/4 cup of tofu.  Eat some foods each day that contain healthy fats, such as avocado, nuts, seeds, and fish. Lifestyle   Check your blood glucose regularly.  Exercise at least 30 minutes 5 or more days each week, or as told by your health care provider.  Take medicines as told by your health care provider.  Do not use any products that contain nicotine or tobacco, such as cigarettes and e-cigarettes. If you need help quitting, ask your health care provider.  Work with a Social worker or diabetes educator to identify strategies to manage stress and any emotional and social challenges. What are some questions to ask my health care provider?  Do I need to meet with a diabetes educator?  Do I need to meet with a dietitian?  What number can I call if I have questions?  When are the best times to check my blood glucose? Where to find more information:  American Diabetes Association: diabetes.org/food-and-fitness/food  Academy of  Nutrition and Dietetics: PokerClues.dk  [employer] of Diabetes and Digestive and Kidney Diseases (NIH): ContactWire.be Summary  A healthy meal plan will help you control your blood glucose and maintain a healthy lifestyle.  Working with a diet and nutrition specialist (dietitian) can help you make a meal plan that is best for you.  Keep in mind that carbohydrates and alcohol have immediate effects on your blood glucose levels. It is important to count carbohydrates and to use alcohol carefully. This information is not intended to replace advice given to you by your health care provider. Make sure you discuss any questions you have with your health care provider. Document Released: 04/18/2005 Document Revised: 08/26/2016 Document Reviewed: 08/26/2016 Elsevier Interactive Patient Education  [employer].

## 2018-03-09 NOTE — Assessment & Plan Note (Signed)
Not very well controlled.  Need endocrinologist evaluation and recommendations.  Urgent referral placed.

## 2018-03-09 NOTE — Progress Notes (Signed)
William [name] 67 y.o.   Chief Complaint  Patient presents with  . Establish Care  . Diabetes    HISTORY OF PRESENT ILLNESS: This is a 67 y.o. male diabetic patient here to establish care.  First time visit to this clinic.  Patient is a Muslim from [place].  Has the following problems: 1.  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 2.  End-stage renal disease on dialysis 3 times a week. 3.  Nonalcoholic liver cirrhosis. 4.  Essential hypertension. 5.  Anemia. 6.  Congestive heart failure. Recently in the hospital for 10 days.  January 16, 2018 with volume overload. Clinically stable.  Has no complaints.  Son doing translation.  He states sugar has been up and down.  Sometimes very low and sometimes very high.  HPI   Prior to Admission medications   Medication Sig Start Date End Date Taking? Authorizing Provider  acetaminophen (TYLENOL) 650 MG CR tablet Take 650 mg by mouth every 8 (eight) hours as needed for pain.   Yes [provider]  amLODipine (NORVASC) 10 MG tablet Take 1 tablet (10 mg total) daily by mouth. 06/20/17 11/06/24 Yes Gildardo Pounds, NP  calcium acetate (PHOSLO) 667 MG capsule Take 2 capsules (1,334 mg total) by mouth 3 (three) times daily with meals. 01/26/18  Yes Purohit, Konrad Dolores, MD  carvedilol (COREG) 12.5 MG tablet Take 12.5 mg by mouth 2 (two) times daily with a meal.   Yes [provider]  ferrous gluconate (IRON 27) 240 (27 FE) MG tablet Take 240 mg by mouth daily.    Yes [provider]  insulin NPH-regular Human (NOVOLIN 70/30) (70-30) 100 UNIT/ML injection Inject 18 Units into the skin 2 (two) times daily with a meal. 11/18/17  Yes Regalado, Belkys A, MD  Multiple Vitamins-Minerals (MULTIVITAMIN WITH MINERALS) tablet Take 1 tablet by mouth daily.   Yes [provider]  lactulose (CHRONULAC) 10 GM/15ML solution Take 30 mLs (20 g total) by mouth 2 (two) times daily. Patient not taking: Reported on 03/09/2018  01/26/18   Cristy Folks, MD    Allergies  Allergen Reactions  . Heparin     Patient is Muslim and is not permitted Pork derative due to religious belief)  . Pork-Derived Products     Patient does not eat pork due to religious beliefs    Patient Active Problem List   Diagnosis Date Noted  . ESRD on dialysis ([place]) 02/06/2018  . SOB (shortness of breath) 01/16/2018  . Fluid overload 01/16/2018  . CKD (chronic kidney disease) stage 4, GFR 15-29 ml/min (HCC) 12/19/2017  . Hyperkalemia 12/19/2017  . HCAP (healthcare-associated pneumonia) 12/19/2017  . Ascites 12/19/2017  . Cirrhosis of liver with ascites ([place]) 12/09/2017  . Pulmonary infiltrates   . Goodpasture's syndrome ([place]) with associated hemoptysis 11/20/2017  . Asymmetric edema of both lower extremities 11/20/2017  . Metabolic acidosis 56/86/1683  . Hemoptysis   . AKI (acute kidney injury) ([place]) 11/07/2017  . Acute respiratory failure with hypoxia ([place]) 11/06/2017  . Acute on chronic diastolic CHF (congestive heart failure) ([place]) 11/06/2017  . Pancytopenia ([place]) 11/06/2017  . GERD (gastroesophageal reflux disease) 11/06/2017  . Essential hypertension 11/06/2017  . Dyspnea on exertion 09/25/2017  . Type II diabetes mellitus with renal manifestations ([place]) 08/25/2015  . Chronic cough 06/10/2013  . Chronic neck pain 06/10/2013    Past Medical History:  Diagnosis Date  . Anemia   . CHF (congestive heart failure) ([place])   .  Cirrhosis ([place])   . Cirrhosis ([place])   . CKD (chronic kidney disease), stage IV ([place])   . Goodpasture's disease [facility])    on outpatient plasmapheresis/notes 11/20/2017  . Grade I diastolic dysfunction 42/68/3419  . History of anemia due to chronic kidney disease   . History of blood transfusion X 1   "UGIB; low blood count"  . History of plasmapheresis    "qod" (11/20/2017)  . Hypertension   . Pancytopenia ([place]) 08/20/2017  . Type II diabetes mellitus ([place])     Past Surgical History:  Procedure  Laterality Date  . AV FISTULA PLACEMENT Left 12/26/2017   Procedure: LEFT BRACHIOCEPHALIC ARTERIOVENOUS (AV) FISTULA CREATION;  Surgeon: Conrad Sycamore, MD;  Location: [facility];  Service: Vascular;  Laterality: Left;  . BASCILIC VEIN TRANSPOSITION Left 02/16/2018   Procedure: BRACHIOBASILIC VEIN TRANSPOSITION SECOND STAGE;  Surgeon: Conrad Piffard, MD;  Location: [facility];  Service: Vascular;  Laterality: Left;  . CATARACT EXTRACTION W/ INTRAOCULAR LENS  IMPLANT, BILATERAL Bilateral   . I&D EXTREMITY Left 02/18/2018   Procedure: IRRIGATION AND DEBRIDEMENT LEFT ARM;  Surgeon: Serafina Mitchell, MD;  Location: [place];  Service: Vascular;  Laterality: Left;  . INSERTION OF DIALYSIS CATHETER N/A 01/19/2018   Procedure: INSERTION OF DIALYSIS CATHETER - RIGHT INTERNAL JUGULAR PLACEMENT;  Surgeon: Rosetta Posner, MD;  Location: [facility];  Service: Vascular;  Laterality: N/A;  . IR FLUORO GUIDE CV LINE RIGHT  11/10/2017  . IR PARACENTESIS  12/10/2017  . IR PARACENTESIS  12/26/2017  . IR REMOVAL TUN CV CATH W/O FL  11/28/2017  . IR US GUIDE VASC ACCESS RIGHT  11/10/2017  . NECK SURGERY  2007   "back of neck; no hardware in there"    Social History   Socioeconomic History  . Marital status: Married    Spouse name: Not on file  . Number of children: Not on file  . Years of education: Not on file  . Highest education level: Not on file  Occupational History  . Not on file  Social Needs  . Financial resource strain: Not on file  . Food insecurity:    Worry: Not on file    Inability: Not on file  . Transportation needs:    Medical: Not on file    Non-medical: Not on file  Tobacco Use  . Smoking status: Never Smoker  . Smokeless tobacco: Never Used  Substance and Sexual Activity  . Alcohol use: Never    Frequency: Never  . Drug use: Never  . Sexual activity: Not on file  Lifestyle  . Physical activity:    Days per week: Not on file    Minutes per session: Not on file  . Stress: Not on file  Relationships    . Social connections:    Talks on phone: Not on file    Gets together: Not on file    Attends religious service: Not on file    Active member of club or organization: Not on file    Attends meetings of clubs or organizations: Not on file    Relationship status: Not on file  . Intimate partner violence:    Fear of current or ex partner: Not on file    Emotionally abused: Not on file    Physically abused: Not on file    Forced sexual activity: Not on file  Other Topics Concern  . Not on file  Social History Narrative  . Not on file  Family History  Problem Relation Age of Onset  . Diabetes Mellitus II Sister   . Stroke Brother      Review of Systems  Constitutional: Positive for malaise/fatigue. Negative for chills and fever.  HENT: Negative.  Negative for hearing loss, nosebleeds and sore throat.   Eyes: Negative.  Negative for blurred vision and double vision.  Respiratory: Negative.  Negative for cough, hemoptysis and shortness of breath.   Cardiovascular: Negative.  Negative for chest pain, palpitations and leg swelling.  Gastrointestinal: Negative.  Negative for abdominal pain, blood in stool, diarrhea, nausea and vomiting.  Genitourinary: Negative.  Negative for dysuria and hematuria.  Musculoskeletal: Negative.  Negative for back pain, myalgias and neck pain.  Skin: Negative.  Negative for rash.  Neurological: Negative.  Negative for dizziness and headaches.  Endo/Heme/Allergies: Negative.   All other systems reviewed and are negative.   Vitals:   03/09/18 0855  BP: (!) 140/59  Pulse: 81  Resp: 16  Temp: 98.3 F (36.8 C)  SpO2: 99%    Physical Exam  Constitutional: He is oriented to person, place, and time. He appears well-developed and well-nourished.  HENT:  Head: Normocephalic and atraumatic.  Nose: Nose normal.  Mouth/Throat: Oropharynx is clear and moist.  Eyes: Pupils are equal, round, and reactive to light. Conjunctivae and EOM are normal.   Neck: Normal range of motion. Neck supple. No thyromegaly present.  Cardiovascular: Normal rate, regular rhythm and normal heart sounds.  Pulmonary/Chest: Effort normal and breath sounds normal. No respiratory distress. He has no rales.  Dialysis catheter on the right subclavian vein.  Abdominal: Soft. He exhibits distension (Mild ascites). There is no tenderness. There is no rebound.  Musculoskeletal: Normal range of motion. He exhibits no edema.  Lymphadenopathy:    He has no cervical adenopathy.  Neurological: He is alert and oriented to person, place, and time. No sensory deficit. He exhibits normal muscle tone.  Skin: Skin is warm and dry. Capillary refill takes less than 2 seconds.  Recently created AV fistula on the left upper arm.  Palpable thrill.  Psychiatric: He has a normal mood and affect. His behavior is normal.  Vitals reviewed.   Essential hypertension Under control.  No change of medication.  Acute on chronic diastolic CHF (congestive heart failure) (HCC) Clinically not in heart failure.  Lungs are clear.  Cirrhosis of liver with ascites (HCC) Stable.  No signs of encephalopathy.  ESRD on dialysis [facility]) Dialysis 3 times a week.  Compliant.  No signs of volume overload.  Type II diabetes mellitus with renal manifestations ([place]) Not very well controlled.  Need endocrinologist evaluation and recommendations.  Urgent referral placed.   ASSESSMENT & PLAN:  Kaston was seen today for establish care and diabetes.  Diagnoses and all orders for this visit:  Type 2 diabetes mellitus with chronic kidney disease on chronic dialysis, with long-term current use of insulin ([place]) -     Ambulatory referral to Endocrinology -     Hemoglobin A1c -     Comprehensive metabolic panel  Essential hypertension -     CBC with Differential/Platelet -     Comprehensive metabolic panel  Acute on chronic diastolic CHF (congestive heart failure) (HCC)  Cirrhosis of liver with  ascites, unspecified hepatic cirrhosis type ([place]) -     CBC with Differential/Platelet  ESRD on dialysis ([place]) -     CBC with Differential/Platelet -     Comprehensive metabolic panel    Patient Instructions  IF you received an x-ray today, you will receive an invoice from [facility]. Please contact [facility] at [phone] with questions or concerns regarding your invoice.   IF you received labwork today, you will receive an invoice from [facility]. Please contact [facility] at [phone] with questions or concerns regarding your invoice.   Our billing staff will not be able to assist you with questions regarding bills from these companies.  You will be contacted with the lab results as soon as they are available. The fastest way to get your results is to activate your My Chart account. Instructions are located on the last page of this paperwork. If you have not heard from [place] regarding the results in 2 weeks, please contact this office.     Cirrhosis Cirrhosis is long-term (chronic) liver injury. The liver is your largest internal organ, and it performs many functions. The liver converts food into energy, removes toxic material from your blood, makes important proteins, and absorbs necessary vitamins from your diet. If you have cirrhosis, it means many of your healthy liver cells have been replaced by scar tissue. This prevents blood from flowing through your liver, which makes it difficult for your liver to function. This scarring is not reversible, but treatment can prevent it from getting worse. What are the causes? Hepatitis C and long-term alcohol abuse are the most common causes of cirrhosis. Other causes include:  Nonalcoholic fatty liver disease.  Hepatitis B infection.  Autoimmune hepatitis.  Diseases that cause blockage of ducts inside the liver.  Inherited liver diseases.  Reactions to certain long-term medicines.  Parasitic  infections.  Long-term exposure to certain toxins.  What increases the risk? You may have a higher risk of cirrhosis if you:  Have certain hepatitis viruses.  Abuse alcohol, especially if you are male.  Are overweight.  Share needles.  Have unprotected sex with someone who has hepatitis.  What are the signs or symptoms? You may not have any signs and symptoms at first. Symptoms may not develop until the damage to your liver starts to get worse. Signs and symptoms of cirrhosis may include:  Tenderness in the right-upper part of your abdomen.  Weakness and tiredness (fatigue).  Loss of appetite.  Nausea.  Weight loss and muscle loss.  Itchiness.  Yellow skin and eyes (jaundice).  Buildup of fluid in the abdomen (ascites).  Swelling of the feet and ankles (edema).  Appearance of tiny blood vessels under the skin.  Mental confusion.  Easy bruising and bleeding.  How is this diagnosed? Your health care provider may suspect cirrhosis based on your symptoms and medical history, especially if you have other medical conditions or a history of alcohol abuse. Your health care provider will do a physical exam to feel your liver and check for signs of cirrhosis. Your health care provider may perform other tests, including:  Blood tests to check: ? Whether you have hepatitis B or C. ? Kidney function. ? Liver function.  Imaging tests such as: ? MRI or CT scan to look for changes seen in advanced cirrhosis. ? Ultrasound to see if normal liver tissue is being replaced by scar tissue.  A procedure using a long needle to take a sample of liver tissue (biopsy) for examination under a microscope. Liver biopsy can confirm the diagnosis of cirrhosis.  How is this treated? Treatment depends on how damaged your liver is and what caused the damage. Treatment may include treating cirrhosis symptoms or treating the underlying causes of  the condition to try to slow the progression of  the damage. Treatment may include:  Making lifestyle changes, such as: ? Eating a healthy diet. ? Restricting salt intake. ? Maintaining a healthy weight. ? Not abusing drugs or alcohol.  Taking medicines to: ? Treat liver infections or other infections. ? Control itching. ? Reduce fluid buildup. ? Reduce certain blood toxins. ? Reduce risk of bleeding from enlarged blood vessels in the stomach or esophagus (varices).  If varices are causing bleeding problems, you may need treatment with a procedure that ties up the vessels causing them to fall off (band ligation).  If cirrhosis is causing your liver to fail, your health care provider may recommend a liver transplant.  Other treatments may be recommended depending on any complications of cirrhosis, such as liver-related kidney failure (hepatorenal syndrome).  Follow these instructions at home:  Take medicines only as directed by your health care provider. Do not use drugs that are toxic to your liver. Ask your health care provider before taking any new medicines, including over-the-counter medicines.  Rest as needed.  Eat a well-balanced diet. Ask your health care provider or dietitian for more information.  You may have to follow a low-salt diet or restrict your water intake as directed.  Do not drink alcohol. This is especially important if you are taking acetaminophen.  Keep all follow-up visits as directed by your health care provider. This is important. Contact a health care provider if:  You have fatigue or weakness that is getting worse.  You develop swelling of the hands, feet, legs, or face.  You have a fever.  You develop loss of appetite.  You have nausea or vomiting.  You develop jaundice.  You develop easy bruising or bleeding. Get help right away if:  You vomit bright red blood or a material that looks like coffee grounds.  You have blood in your stools.  Your stools appear black and tarry.  You  become confused.  You have chest pain or trouble breathing. This information is not intended to replace advice given to you by your health care provider. Make sure you discuss any questions you have with your health care provider. Document Released: 07/22/2005 Document Revised: 11/30/2015 Document Reviewed: 03/30/2014 Elsevier Interactive Patient Education  2018 [employer].  Dialysis Diet Dialysis is a treatment that cleans your blood. It is used when the kidneys are damaged. When you need dialysis, you should watch your diet. This is because some nutrients can build up in your blood between treatments and make you sick. These nutrients are:  Potassium.  Phosphorus.  Sodium.  Your doctor or dietitian will:  Tell you how much of these you can have.  Tell you if you need to look out for other nutrients too.  Help you plan meals.  Tell you how much to drink each day.  What do I need to know about this diet?  Limit potassium. Potassium is in milk, fruits, and vegetables.  Limit phosphorus. Phosphorus is in milk, cheese, beans, nuts, and carbonated beverages.  Limit salt (sodium). Foods that have a lot sodium include processed and cured meats, ready-made frozen meals, canned vegetables, and salty snack foods.  Do not use salt substitutes.  Try not to eat whole-grain foods and foods that have a lot of fiber.  Follow your doctor's instructions about how much to drink. You may be told to: ? Write down what you drink. ? Write down foods you eat that are made mostly from  water, such as gelatin and soups. ? Drink from small cups.  Ask your doctor if you should take a medicine that binds phosphorus.  Take vitamin and mineral supplements only as told by your doctor.  Eat meat, poultry, fish, and eggs. Limit nuts and beans.  Before you cook potatoes, cut them into small pieces. Then boil them in unsalted water.  Drain all fluid from cooked vegetables and canned fruits before  you eat them. What foods can I eat? Grains White bread. White rice. Cooked cereal. Unsalted popcorn. Tortillas. Pasta. Vegetables Fresh or frozen broccoli, carrots, and green beans. Cabbage. Cauliflower. Celery. Cucumbers. Eggplant. Radishes. Zucchini. Fruits Apples. Fresh or frozen berries. Fresh or canned pears, peaches, and pineapple. Grapes. Plums. Meats and Other Protein Sources Fresh or frozen beef, pork, chicken, and fish. Eggs. Low-sodium canned tuna or salmon. Dairy Cream cheese. Heavy cream. Ricotta cheese. Beverages Apple cider. Cranberry juice. Grape juice. Lemonade. Black coffee. Condiments Herbs. Spices. Jam and jelly. Honey. Sweets and Desserts Sherbet. Cakes. Cookies. Fats and Oils Olive oil, canola oil, and safflower oil. Other Non-dairy creamer. Non-dairy whipped topping. Homemade broth without salt. The items listed above may not be a complete list of recommended foods or beverages. Contact your dietitian for more options. What foods are not recommended? Grains Whole-grain bread. Whole-grain pasta. High-fiber cereal. Vegetables Potatoes. Beets. Tomatoes. Winter squash and pumpkin. Asparagus. Spinach. Parsnips. Fruits Star fruit. Bananas. Oranges. Kiwi. Nectarines. Prunes. Melon. Dried fruit. Avocado. Meats and Other Protein Sources Canned, smoked, and cured meats. Soil scientist. Sardines. Nuts and seeds. Peanut butter. Beans and legumes. Dairy Milk. Buttermilk. Yogurt. Cheese and cottage cheese. Processed cheese spreads. Beverages Orange juice. Prune juice. Carbonated soft drinks. Condiments Salt. Salt substitutes. Soy sauce. Sweets and Desserts Ice cream. Chocolate. Candied nuts. Fats and Oils Butter. Margarine. Other Ready-made frozen meals. Canned soups. The items listed above may not be a complete list of foods and beverages to avoid. Contact your dietitian for more information. This information is not intended to replace advice given to  you by your health care provider. Make sure you discuss any questions you have with your health care provider. Document Released: 01/21/2012 Document Revised: 12/28/2015 Document Reviewed: 02/22/2014 Elsevier Interactive Patient Education  2018 [employer].  Diabetes Mellitus and Nutrition When you have diabetes (diabetes mellitus), it is very important to have healthy eating habits because your blood sugar (glucose) levels are greatly affected by what you eat and drink. Eating healthy foods in the appropriate amounts, at about the same times every day, can help you:  Control your blood glucose.  Lower your risk of heart disease.  Improve your blood pressure.  Reach or maintain a healthy weight.  Every person with diabetes is different, and each person has different needs for a meal plan. Your health care provider may recommend that you work with a diet and nutrition specialist (dietitian) to make a meal plan that is best for you. Your meal plan may vary depending on factors such as:  The calories you need.  The medicines you take.  Your weight.  Your blood glucose, blood pressure, and cholesterol levels.  Your activity level.  Other health conditions you have, such as heart or kidney disease.  How do carbohydrates affect me? Carbohydrates affect your blood glucose level more than any other type of food. Eating carbohydrates naturally increases the amount of glucose in your blood. Carbohydrate counting is a method for keeping track of how many carbohydrates you eat. Counting carbohydrates is important  to keep your blood glucose at a healthy level, especially if you use insulin or take certain oral diabetes medicines. It is important to know how many carbohydrates you can safely have in each meal. This is different for every person. Your dietitian can help you calculate how many carbohydrates you should have at each meal and for snack. Foods that contain carbohydrates  include:  Bread, cereal, rice, pasta, and crackers.  Potatoes and corn.  Peas, beans, and lentils.  Milk and yogurt.  Fruit and juice.  Desserts, such as cakes, cookies, ice cream, and candy.  How does alcohol affect me? Alcohol can cause a sudden decrease in blood glucose (hypoglycemia), especially if you use insulin or take certain oral diabetes medicines. Hypoglycemia can be a life-threatening condition. Symptoms of hypoglycemia (sleepiness, dizziness, and confusion) are similar to symptoms of having too much alcohol. If your health care provider says that alcohol is safe for you, follow these guidelines:  Limit alcohol intake to no more than 1 drink per day for nonpregnant women and 2 drinks per day for men. One drink equals 12 oz of beer, 5 oz of wine, or 1 oz of hard liquor.  Do not drink on an empty stomach.  Keep yourself hydrated with water, diet soda, or unsweetened iced tea.  Keep in mind that regular soda, juice, and other mixers may contain a lot of sugar and must be counted as carbohydrates.  What are tips for following this plan? Reading food labels  Start by checking the serving size on the label. The amount of calories, carbohydrates, fats, and other nutrients listed on the label are based on one serving of the food. Many foods contain more than one serving per package.  Check the total grams (g) of carbohydrates in one serving. You can calculate the number of servings of carbohydrates in one serving by dividing the total carbohydrates by 15. For example, if a food has 30 g of total carbohydrates, it would be equal to 2 servings of carbohydrates.  Check the number of grams (g) of saturated and trans fats in one serving. Choose foods that have low or no amount of these fats.  Check the number of milligrams (mg) of sodium in one serving. Most people should limit total sodium intake to less than 2,300 mg per day.  Always check the nutrition information of foods  labeled as "low-fat" or "nonfat". These foods may be higher in added sugar or refined carbohydrates and should be avoided.  Talk to your dietitian to identify your daily goals for nutrients listed on the label. Shopping  Avoid buying canned, premade, or processed foods. These foods tend to be high in fat, sodium, and added sugar.  Shop around the outside edge of the grocery store. This includes fresh fruits and vegetables, bulk grains, fresh meats, and fresh dairy. Cooking  Use low-heat cooking methods, such as baking, instead of high-heat cooking methods like deep frying.  Cook using healthy oils, such as olive, canola, or sunflower oil.  Avoid cooking with butter, cream, or high-fat meats. Meal planning  Eat meals and snacks regularly, preferably at the same times every day. Avoid going long periods of time without eating.  Eat foods high in fiber, such as fresh fruits, vegetables, beans, and whole grains. Talk to your dietitian about how many servings of carbohydrates you can eat at each meal.  Eat 4-6 ounces of lean protein each day, such as lean meat, chicken, fish, eggs, or tofu. 1 ounce is  equal to 1 ounce of meat, chicken, or fish, 1 egg, or 1/4 cup of tofu.  Eat some foods each day that contain healthy fats, such as avocado, nuts, seeds, and fish. Lifestyle   Check your blood glucose regularly.  Exercise at least 30 minutes 5 or more days each week, or as told by your health care provider.  Take medicines as told by your health care provider.  Do not use any products that contain nicotine or tobacco, such as cigarettes and e-cigarettes. If you need help quitting, ask your health care provider.  Work with a Social worker or diabetes educator to identify strategies to manage stress and any emotional and social challenges. What are some questions to ask my health care provider?  Do I need to meet with a diabetes educator?  Do I need to meet with a dietitian?  What number  can I call if I have questions?  When are the best times to check my blood glucose? Where to find more information:  American Diabetes Association: diabetes.org/food-and-fitness/food  Academy of Nutrition and Dietetics: PokerClues.dk  [employer] of Diabetes and Digestive and Kidney Diseases (NIH): ContactWire.be Summary  A healthy meal plan will help you control your blood glucose and maintain a healthy lifestyle.  Working with a diet and nutrition specialist (dietitian) can help you make a meal plan that is best for you.  Keep in mind that carbohydrates and alcohol have immediate effects on your blood glucose levels. It is important to count carbohydrates and to use alcohol carefully. This information is not intended to replace advice given to you by your health care provider. Make sure you discuss any questions you have with your health care provider. Document Released: 04/18/2005 Document Revised: 08/26/2016 Document Reviewed: 08/26/2016 Elsevier Interactive Patient Education  2018 [employer].     Agustina Caroli, MD [facility]

## 2018-03-09 NOTE — Assessment & Plan Note (Signed)
Stable.  No signs of encephalopathy.

## 2018-03-09 NOTE — Assessment & Plan Note (Signed)
Clinically not in heart failure.  Lungs are clear.

## 2018-03-10 DIAGNOSIS — D509 Iron deficiency anemia, unspecified: Secondary | ICD-10-CM | POA: Diagnosis not present

## 2018-03-10 DIAGNOSIS — N186 End stage renal disease: Secondary | ICD-10-CM | POA: Diagnosis not present

## 2018-03-10 DIAGNOSIS — N2581 Secondary hyperparathyroidism of renal origin: Secondary | ICD-10-CM | POA: Diagnosis not present

## 2018-03-10 DIAGNOSIS — D631 Anemia in chronic kidney disease: Secondary | ICD-10-CM | POA: Diagnosis not present

## 2018-03-10 LAB — CBC WITH DIFFERENTIAL/PLATELET
BASOS: 0 %
Basophils Absolute: 0 10*3/uL (ref 0.0–0.2)
EOS (ABSOLUTE): 0.2 10*3/uL (ref 0.0–0.4)
EOS: 8 %
HEMATOCRIT: 29.2 % — AB (ref 37.5–51.0)
HEMOGLOBIN: 9.3 g/dL — AB (ref 13.0–17.7)
Immature Grans (Abs): 0 10*3/uL (ref 0.0–0.1)
Immature Granulocytes: 0 %
LYMPHS ABS: 1 10*3/uL (ref 0.7–3.1)
Lymphs: 36 %
MCH: 25.4 pg — ABNORMAL LOW (ref 26.6–33.0)
MCHC: 31.8 g/dL (ref 31.5–35.7)
MCV: 80 fL (ref 79–97)
MONOCYTES: 11 %
MONOS ABS: 0.3 10*3/uL (ref 0.1–0.9)
Neutrophils Absolute: 1.2 10*3/uL — ABNORMAL LOW (ref 1.4–7.0)
Neutrophils: 45 %
PLATELETS: 60 10*3/uL — AB (ref 150–450)
RBC: 3.66 x10E6/uL — AB (ref 4.14–5.80)
RDW: 16.7 % — ABNORMAL HIGH (ref 12.3–15.4)
WBC: 2.8 10*3/uL — AB (ref 3.4–10.8)

## 2018-03-10 LAB — COMPREHENSIVE METABOLIC PANEL
A/G RATIO: 1.4 (ref 1.2–2.2)
ALK PHOS: 143 IU/L — AB (ref 39–117)
ALT: 13 IU/L (ref 0–44)
AST: 31 IU/L (ref 0–40)
Albumin: 4 g/dL (ref 3.6–4.8)
BUN/Creatinine Ratio: 12 (ref 10–24)
BUN: 47 mg/dL — AB (ref 8–27)
Bilirubin Total: 0.7 mg/dL (ref 0.0–1.2)
CO2: 23 mmol/L (ref 20–29)
CREATININE: 4.04 mg/dL — AB (ref 0.76–1.27)
Calcium: 9.3 mg/dL (ref 8.6–10.2)
Chloride: 91 mmol/L — ABNORMAL LOW (ref 96–106)
GFR calc Af Amer: 17 mL/min/{1.73_m2} — ABNORMAL LOW (ref >59)
GFR, EST NON AFRICAN AMERICAN: 14 mL/min/{1.73_m2} — AB (ref >59)
GLOBULIN, TOTAL: 2.8 g/dL (ref 1.5–4.5)
Glucose: 166 mg/dL — ABNORMAL HIGH (ref 65–99)
Potassium: 3.8 mmol/L (ref 3.5–5.2)
Sodium: 132 mmol/L — ABNORMAL LOW (ref 134–144)
Total Protein: 6.8 g/dL (ref 6.0–8.5)

## 2018-03-10 LAB — HEMOGLOBIN A1C
Est. average glucose Bld gHb Est-mCnc: 154 mg/dL
Hgb A1c MFr Bld: 7 % — ABNORMAL HIGH (ref 4.8–5.6)

## 2018-03-11 ENCOUNTER — Telehealth: Payer: Self-pay | Admitting: Emergency Medicine

## 2018-03-11 NOTE — Telephone Encounter (Signed)
Dr. Pamella Pert notified.  Platelet result r/t chronic dx and not a new abnormal.

## 2018-03-11 NOTE — Telephone Encounter (Signed)
Copied from [place] [id number]. Topic: Quick Communication - See Telephone Encounter >> Mar 11, 2018  8:36 AM Antonieta Iba C wrote: CRM for notification. See Telephone encounter for: 03/11/18.  Called in to get pt results.   Platelettes are [place] w/ [employer]  -- [phone] opt 1

## 2018-03-12 DIAGNOSIS — N2581 Secondary hyperparathyroidism of renal origin: Secondary | ICD-10-CM | POA: Diagnosis not present

## 2018-03-12 DIAGNOSIS — D509 Iron deficiency anemia, unspecified: Secondary | ICD-10-CM | POA: Diagnosis not present

## 2018-03-12 DIAGNOSIS — D631 Anemia in chronic kidney disease: Secondary | ICD-10-CM | POA: Diagnosis not present

## 2018-03-12 DIAGNOSIS — N186 End stage renal disease: Secondary | ICD-10-CM | POA: Diagnosis not present

## 2018-03-14 DIAGNOSIS — N2581 Secondary hyperparathyroidism of renal origin: Secondary | ICD-10-CM | POA: Diagnosis not present

## 2018-03-14 DIAGNOSIS — D509 Iron deficiency anemia, unspecified: Secondary | ICD-10-CM | POA: Diagnosis not present

## 2018-03-14 DIAGNOSIS — D631 Anemia in chronic kidney disease: Secondary | ICD-10-CM | POA: Diagnosis not present

## 2018-03-14 DIAGNOSIS — N186 End stage renal disease: Secondary | ICD-10-CM | POA: Diagnosis not present

## 2018-03-17 DIAGNOSIS — N2581 Secondary hyperparathyroidism of renal origin: Secondary | ICD-10-CM | POA: Diagnosis not present

## 2018-03-17 DIAGNOSIS — D509 Iron deficiency anemia, unspecified: Secondary | ICD-10-CM | POA: Diagnosis not present

## 2018-03-17 DIAGNOSIS — N186 End stage renal disease: Secondary | ICD-10-CM | POA: Diagnosis not present

## 2018-03-17 DIAGNOSIS — D631 Anemia in chronic kidney disease: Secondary | ICD-10-CM | POA: Diagnosis not present

## 2018-03-18 DIAGNOSIS — R945 Abnormal results of liver function studies: Secondary | ICD-10-CM | POA: Diagnosis not present

## 2018-03-18 DIAGNOSIS — K7460 Unspecified cirrhosis of liver: Secondary | ICD-10-CM | POA: Diagnosis not present

## 2018-03-18 DIAGNOSIS — M310 Hypersensitivity angiitis: Secondary | ICD-10-CM | POA: Diagnosis not present

## 2018-03-18 DIAGNOSIS — Z992 Dependence on renal dialysis: Secondary | ICD-10-CM | POA: Diagnosis not present

## 2018-03-18 DIAGNOSIS — D638 Anemia in other chronic diseases classified elsewhere: Secondary | ICD-10-CM | POA: Diagnosis not present

## 2018-03-18 DIAGNOSIS — N186 End stage renal disease: Secondary | ICD-10-CM | POA: Diagnosis not present

## 2018-03-18 DIAGNOSIS — D696 Thrombocytopenia, unspecified: Secondary | ICD-10-CM | POA: Diagnosis not present

## 2018-03-19 ENCOUNTER — Other Ambulatory Visit: Payer: Self-pay | Admitting: Gastroenterology

## 2018-03-19 DIAGNOSIS — D631 Anemia in chronic kidney disease: Secondary | ICD-10-CM | POA: Diagnosis not present

## 2018-03-19 DIAGNOSIS — D509 Iron deficiency anemia, unspecified: Secondary | ICD-10-CM | POA: Diagnosis not present

## 2018-03-19 DIAGNOSIS — N2581 Secondary hyperparathyroidism of renal origin: Secondary | ICD-10-CM | POA: Diagnosis not present

## 2018-03-19 DIAGNOSIS — N186 End stage renal disease: Secondary | ICD-10-CM | POA: Diagnosis not present

## 2018-03-21 DIAGNOSIS — D509 Iron deficiency anemia, unspecified: Secondary | ICD-10-CM | POA: Diagnosis not present

## 2018-03-21 DIAGNOSIS — N2581 Secondary hyperparathyroidism of renal origin: Secondary | ICD-10-CM | POA: Diagnosis not present

## 2018-03-21 DIAGNOSIS — D631 Anemia in chronic kidney disease: Secondary | ICD-10-CM | POA: Diagnosis not present

## 2018-03-21 DIAGNOSIS — N186 End stage renal disease: Secondary | ICD-10-CM | POA: Diagnosis not present

## 2018-03-24 DIAGNOSIS — N2581 Secondary hyperparathyroidism of renal origin: Secondary | ICD-10-CM | POA: Diagnosis not present

## 2018-03-24 DIAGNOSIS — D631 Anemia in chronic kidney disease: Secondary | ICD-10-CM | POA: Diagnosis not present

## 2018-03-24 DIAGNOSIS — N186 End stage renal disease: Secondary | ICD-10-CM | POA: Diagnosis not present

## 2018-03-24 DIAGNOSIS — D509 Iron deficiency anemia, unspecified: Secondary | ICD-10-CM | POA: Diagnosis not present

## 2018-03-25 DIAGNOSIS — H353211 Exudative age-related macular degeneration, right eye, with active choroidal neovascularization: Secondary | ICD-10-CM | POA: Diagnosis not present

## 2018-03-25 DIAGNOSIS — E113592 Type 2 diabetes mellitus with proliferative diabetic retinopathy without macular edema, left eye: Secondary | ICD-10-CM | POA: Diagnosis not present

## 2018-03-25 DIAGNOSIS — R7989 Other specified abnormal findings of blood chemistry: Secondary | ICD-10-CM | POA: Diagnosis not present

## 2018-03-25 DIAGNOSIS — H353121 Nonexudative age-related macular degeneration, left eye, early dry stage: Secondary | ICD-10-CM | POA: Diagnosis not present

## 2018-03-26 DIAGNOSIS — N2581 Secondary hyperparathyroidism of renal origin: Secondary | ICD-10-CM | POA: Diagnosis not present

## 2018-03-26 DIAGNOSIS — D509 Iron deficiency anemia, unspecified: Secondary | ICD-10-CM | POA: Diagnosis not present

## 2018-03-26 DIAGNOSIS — N186 End stage renal disease: Secondary | ICD-10-CM | POA: Diagnosis not present

## 2018-03-26 DIAGNOSIS — D631 Anemia in chronic kidney disease: Secondary | ICD-10-CM | POA: Diagnosis not present

## 2018-03-28 DIAGNOSIS — N2581 Secondary hyperparathyroidism of renal origin: Secondary | ICD-10-CM | POA: Diagnosis not present

## 2018-03-28 DIAGNOSIS — D631 Anemia in chronic kidney disease: Secondary | ICD-10-CM | POA: Diagnosis not present

## 2018-03-28 DIAGNOSIS — D509 Iron deficiency anemia, unspecified: Secondary | ICD-10-CM | POA: Diagnosis not present

## 2018-03-28 DIAGNOSIS — N186 End stage renal disease: Secondary | ICD-10-CM | POA: Diagnosis not present

## 2018-03-31 DIAGNOSIS — N2581 Secondary hyperparathyroidism of renal origin: Secondary | ICD-10-CM | POA: Diagnosis not present

## 2018-03-31 DIAGNOSIS — N186 End stage renal disease: Secondary | ICD-10-CM | POA: Diagnosis not present

## 2018-03-31 DIAGNOSIS — D509 Iron deficiency anemia, unspecified: Secondary | ICD-10-CM | POA: Diagnosis not present

## 2018-03-31 DIAGNOSIS — D631 Anemia in chronic kidney disease: Secondary | ICD-10-CM | POA: Diagnosis not present

## 2018-04-01 ENCOUNTER — Other Ambulatory Visit: Payer: Self-pay

## 2018-04-01 ENCOUNTER — Encounter (HOSPITAL_COMMUNITY)
Admission: RE | Disposition: A | Discharge status: Home / Self Care | Payer: Self-pay | Source: Ambulatory Visit | Attending: Gastroenterology

## 2018-04-01 ENCOUNTER — Ambulatory Visit (HOSPITAL_COMMUNITY)
Admission: RE | Admit: 2018-04-01 | Discharge: 2018-04-01 | Disposition: A | Discharge status: Home / Self Care | Payer: Medicare Other | Source: Ambulatory Visit | Attending: Gastroenterology | Admitting: Gastroenterology

## 2018-04-01 ENCOUNTER — Ambulatory Visit (HOSPITAL_COMMUNITY): Payer: Medicare Other | Admitting: Anesthesiology

## 2018-04-01 ENCOUNTER — Encounter (HOSPITAL_COMMUNITY): Payer: Self-pay | Admitting: *Deleted

## 2018-04-01 DIAGNOSIS — I132 Hypertensive heart and chronic kidney disease with heart failure and with stage 5 chronic kidney disease, or end stage renal disease: Secondary | ICD-10-CM | POA: Insufficient documentation

## 2018-04-01 DIAGNOSIS — Z9889 Other specified postprocedural states: Secondary | ICD-10-CM | POA: Diagnosis not present

## 2018-04-01 DIAGNOSIS — Z9841 Cataract extraction status, right eye: Secondary | ICD-10-CM | POA: Diagnosis not present

## 2018-04-01 DIAGNOSIS — Z992 Dependence on renal dialysis: Secondary | ICD-10-CM | POA: Insufficient documentation

## 2018-04-01 DIAGNOSIS — Z79899 Other long term (current) drug therapy: Secondary | ICD-10-CM | POA: Diagnosis not present

## 2018-04-01 DIAGNOSIS — K7469 Other cirrhosis of liver: Secondary | ICD-10-CM | POA: Diagnosis not present

## 2018-04-01 DIAGNOSIS — K2920 Alcoholic gastritis without bleeding: Secondary | ICD-10-CM | POA: Diagnosis not present

## 2018-04-01 DIAGNOSIS — K2950 Unspecified chronic gastritis without bleeding: Secondary | ICD-10-CM | POA: Diagnosis not present

## 2018-04-01 DIAGNOSIS — I864 Gastric varices: Secondary | ICD-10-CM | POA: Diagnosis not present

## 2018-04-01 DIAGNOSIS — K219 Gastro-esophageal reflux disease without esophagitis: Secondary | ICD-10-CM | POA: Diagnosis not present

## 2018-04-01 DIAGNOSIS — I8510 Secondary esophageal varices without bleeding: Secondary | ICD-10-CM | POA: Diagnosis not present

## 2018-04-01 DIAGNOSIS — Z961 Presence of intraocular lens: Secondary | ICD-10-CM | POA: Diagnosis not present

## 2018-04-01 DIAGNOSIS — N185 Chronic kidney disease, stage 5: Secondary | ICD-10-CM | POA: Diagnosis not present

## 2018-04-01 DIAGNOSIS — I8500 Esophageal varices without bleeding: Secondary | ICD-10-CM | POA: Diagnosis not present

## 2018-04-01 DIAGNOSIS — K7460 Unspecified cirrhosis of liver: Secondary | ICD-10-CM | POA: Diagnosis not present

## 2018-04-01 DIAGNOSIS — Z888 Allergy status to other drugs, medicaments and biological substances status: Secondary | ICD-10-CM | POA: Insufficient documentation

## 2018-04-01 DIAGNOSIS — E1122 Type 2 diabetes mellitus with diabetic chronic kidney disease: Secondary | ICD-10-CM | POA: Insufficient documentation

## 2018-04-01 DIAGNOSIS — Z794 Long term (current) use of insulin: Secondary | ICD-10-CM | POA: Diagnosis not present

## 2018-04-01 DIAGNOSIS — Z9842 Cataract extraction status, left eye: Secondary | ICD-10-CM | POA: Diagnosis not present

## 2018-04-01 DIAGNOSIS — Z91018 Allergy to other foods: Secondary | ICD-10-CM | POA: Diagnosis not present

## 2018-04-01 DIAGNOSIS — D631 Anemia in chronic kidney disease: Secondary | ICD-10-CM | POA: Insufficient documentation

## 2018-04-01 HISTORY — PX: ESOPHAGEAL BANDING: SHX5518

## 2018-04-01 HISTORY — PX: ESOPHAGOGASTRODUODENOSCOPY (EGD) WITH PROPOFOL: SHX5813

## 2018-04-01 LAB — POCT I-STAT 4, (NA,K, GLUC, HGB,HCT)
Glucose, Bld: 105 mg/dL — ABNORMAL HIGH (ref 70–99)
HCT: 33 % — ABNORMAL LOW (ref 39.0–52.0)
HEMOGLOBIN: 11.2 g/dL — AB (ref 13.0–17.0)
POTASSIUM: 4 mmol/L (ref 3.5–5.1)
Sodium: 131 mmol/L — ABNORMAL LOW (ref 135–145)

## 2018-04-01 SURGERY — ESOPHAGOGASTRODUODENOSCOPY (EGD) WITH PROPOFOL
Anesthesia: Monitor Anesthesia Care

## 2018-04-01 MED ORDER — LACTATED RINGERS IV SOLN: INTRAVENOUS | Status: DC

## 2018-04-01 MED ORDER — PROPOFOL 10 MG/ML IV BOLUS
INTRAVENOUS | Status: AC
  Filled 2018-04-01: qty 40

## 2018-04-01 MED ORDER — PROPOFOL 500 MG/50ML IV EMUL
INTRAVENOUS | Status: DC | PRN
  Administered 2018-04-01: 60 mg via INTRAVENOUS

## 2018-04-01 MED ORDER — PANTOPRAZOLE SODIUM 20 MG PO TBEC: 20.0000 mg | DELAYED_RELEASE_TABLET | Freq: Every day | ORAL | 1 refills | Status: DC

## 2018-04-01 MED ORDER — PROPOFOL 500 MG/50ML IV EMUL
INTRAVENOUS | Status: DC | PRN
  Administered 2018-04-01: 150 ug/kg/min via INTRAVENOUS

## 2018-04-01 MED ORDER — SODIUM CHLORIDE 0.9 % IV SOLN
INTRAVENOUS | Status: DC
  Administered 2018-04-01: 500 mL via INTRAVENOUS

## 2018-04-01 MED ORDER — SODIUM CHLORIDE 0.9 % IV SOLN
INTRAVENOUS | Status: DC
  Administered 2018-04-01: 13:00:00 via INTRAVENOUS

## 2018-04-01 SURGICAL SUPPLY — 15 items

## 2018-04-01 NOTE — Anesthesia Postprocedure Evaluation (Signed)
Anesthesia Post Note  Patient: William [name]  Procedure(s) Performed: ESOPHAGOGASTRODUODENOSCOPY (EGD) WITH PROPOFOL (N/A ) ESOPHAGEAL BANDING (N/A )     Patient location during evaluation: PACU Anesthesia Type: MAC Level of consciousness: awake and alert Pain management: pain level controlled Vital Signs Assessment: post-procedure vital signs reviewed and stable Respiratory status: spontaneous breathing, nonlabored ventilation, respiratory function stable and patient connected to nasal cannula oxygen Cardiovascular status: stable and blood pressure returned to baseline Postop Assessment: no apparent nausea or vomiting Anesthetic complications: no    Last Vitals:  Vitals:   04/01/18 1320 04/01/18 1330  BP: (!) 116/38 (!) 119/38  Pulse: 65 64  Resp: 17 16  Temp:    SpO2: 99% 98%    Last Pain:  Vitals:   04/01/18 1330  TempSrc:   PainSc: 0-No pain                 Effie Berkshire

## 2018-04-01 NOTE — Anesthesia Preprocedure Evaluation (Addendum)
Anesthesia Evaluation  Patient identified by MRN, date of birth, ID band Patient awake    Reviewed: Allergy & Precautions, NPO status , Patient's Chart, lab work & pertinent test results  Airway Mallampati: I  TM Distance: >3 FB Neck ROM: Full    Dental  (+) Teeth Intact, Dental Advisory Given   Pulmonary neg pulmonary ROS,    breath sounds clear to auscultation       Cardiovascular hypertension, Pt. on home beta blockers +CHF   Rhythm:Regular Rate:Normal     Neuro/Psych negative neurological ROS     GI/Hepatic Neg liver ROS, GERD  ,  Endo/Other  diabetes, Type 2, Insulin Dependent  Renal/GU CRFRenal disease     Musculoskeletal negative musculoskeletal ROS (+)   Abdominal Normal abdominal exam  (+)   Peds  Hematology   Anesthesia Other Findings   Reproductive/Obstetrics                            Anesthesia Physical Anesthesia Plan  ASA: III  Anesthesia Plan: MAC   Post-op Pain Management:    Induction: Intravenous  PONV Risk Score and Plan: Propofol infusion  Airway Management Planned: Nasal Cannula  Additional Equipment: None  Intra-op Plan:   Post-operative Plan:   Informed Consent: I have reviewed the patients History and Physical, chart, labs and discussed the procedure including the risks, benefits and alternatives for the proposed anesthesia with the patient or authorized representative who has indicated his/her understanding and acceptance.     Plan Discussed with: CRNA  Anesthesia Plan Comments:         Anesthesia Quick Evaluation

## 2018-04-01 NOTE — H&P (Signed)
Primary Care Physician:  Horald Pollen, MD Primary Gastroenterologist:  Dr. Alessandra Bevels  Reason for Visit : surveillance EGD  HPI: William [name] is a 67 y.o. male with past medical history of end-stage renal disease currently on hemodialysis, history of cryptogenic cirrhosis presented today for EGD to screen for esophageal varices.  Denies any GI symptoms today. Denies abdominal pain, nausea vomiting. He denies blood in the stool or black stool. Had an episode of diarrhea yesterday which is resolved now.  Past Medical History:  Diagnosis Date  . Anemia   . CHF (congestive heart failure) ([place])   . Cirrhosis ([place])   . Cirrhosis ([place])   . CKD (chronic kidney disease), stage IV ([place])   . Goodpasture's disease [facility])    on outpatient plasmapheresis/notes 11/20/2017  . Grade I diastolic dysfunction 00/86/7619  . History of anemia due to chronic kidney disease   . History of blood transfusion X 1   "UGIB; low blood count"  . History of plasmapheresis    "qod" (11/20/2017)  . Hypertension   . Pancytopenia ([place]) 08/20/2017  . Type II diabetes mellitus ([place])     Past Surgical History:  Procedure Laterality Date  . AV FISTULA PLACEMENT Left 12/26/2017   Procedure: LEFT BRACHIOCEPHALIC ARTERIOVENOUS (AV) FISTULA CREATION;  Surgeon: Conrad Grimes, MD;  Location: [place];  Service: Vascular;  Laterality: Left;  . BASCILIC VEIN TRANSPOSITION Left 02/16/2018   Procedure: BRACHIOBASILIC VEIN TRANSPOSITION SECOND STAGE;  Surgeon: Conrad [place], MD;  Location: [place];  Service: Vascular;  Laterality: Left;  . CATARACT EXTRACTION W/ INTRAOCULAR LENS  IMPLANT, BILATERAL Bilateral   . I&D EXTREMITY Left 02/18/2018   Procedure: IRRIGATION AND DEBRIDEMENT LEFT ARM;  Surgeon: Serafina Mitchell, MD;  Location: [place];  Service: Vascular;  Laterality: Left;  . INSERTION OF DIALYSIS CATHETER N/A 01/19/2018   Procedure: INSERTION OF DIALYSIS CATHETER - RIGHT INTERNAL JUGULAR PLACEMENT;  Surgeon: Rosetta Posner, MD;  Location: [place];  Service: Vascular;  Laterality: N/A;  . IR FLUORO GUIDE CV LINE RIGHT  11/10/2017  . IR PARACENTESIS  12/10/2017  . IR PARACENTESIS  12/26/2017  . IR REMOVAL TUN CV CATH W/O FL  11/28/2017  . IR US GUIDE VASC ACCESS RIGHT  11/10/2017  . NECK SURGERY  2007   "back of neck; no hardware in there"    Prior to Admission medications   Medication Sig Start Date End Date Taking? Authorizing Provider  acetaminophen (TYLENOL) 650 MG CR tablet Take 650 mg by mouth every 8 (eight) hours as needed for pain.   Yes [provider]  amLODipine (NORVASC) 10 MG tablet Take 1 tablet (10 mg total) daily by mouth. 06/20/17 11/06/24 Yes Gildardo Pounds, NP  carvedilol (COREG) 12.5 MG tablet Take 12.5 mg by mouth 2 (two) times daily with a meal.   Yes [provider]  ferrous gluconate (IRON 27) 240 (27 FE) MG tablet Take 240 mg by mouth daily.    Yes [provider]  insulin NPH-regular Human (NOVOLIN 70/30) (70-30) 100 UNIT/ML injection Inject 18 Units into the skin 2 (two) times daily with a meal. 11/18/17  Yes Regalado, Belkys A, MD  Multiple Vitamins-Minerals (MULTIVITAMIN WITH MINERALS) tablet Take 1 tablet by mouth daily.   Yes [provider]  Polyethyl Glycol-Propyl Glycol (SYSTANE) 0.4-0.3 % SOLN Place 1 drop into both eyes 3 (three) times daily as needed (for dry eyes).   Yes [provider]  calcium acetate (PHOSLO) 667 MG  capsule Take 2 capsules (1,334 mg total) by mouth 3 (three) times daily with meals. Patient not taking: Reported on 03/30/2018 01/26/18   Purohit, Konrad Dolores, MD  lactulose (CHRONULAC) 10 GM/15ML solution Take 30 mLs (20 g total) by mouth 2 (two) times daily. Patient not taking: Reported on 03/09/2018 01/26/18   Cristy Folks, MD    Scheduled Meds: Continuous Infusions: . sodium chloride    . sodium chloride    . lactated ringers     PRN Meds:.  Allergies as of 03/19/2018 - Review Complete 03/09/2018  Allergen Reaction  Noted  . Heparin  11/11/2017  . Pork-derived products  02/18/2018    Family History  Problem Relation Age of Onset  . Diabetes Mellitus II Sister   . Stroke Brother     Social History   Socioeconomic History  . Marital status: Married    Spouse name: Not on file  . Number of children: Not on file  . Years of education: Not on file  . Highest education level: Not on file  Occupational History  . Not on file  Social Needs  . Financial resource strain: Not on file  . Food insecurity:    Worry: Not on file    Inability: Not on file  . Transportation needs:    Medical: Not on file    Non-medical: Not on file  Tobacco Use  . Smoking status: Never Smoker  . Smokeless tobacco: Never Used  Substance and Sexual Activity  . Alcohol use: Never    Frequency: Never  . Drug use: Never  . Sexual activity: Not on file  Lifestyle  . Physical activity:    Days per week: Not on file    Minutes per session: Not on file  . Stress: Not on file  Relationships  . Social connections:    Talks on phone: Not on file    Gets together: Not on file    Attends religious service: Not on file    Active member of club or organization: Not on file    Attends meetings of clubs or organizations: Not on file    Relationship status: Not on file  . Intimate partner violence:    Fear of current or ex partner: Not on file    Emotionally abused: Not on file    Physically abused: Not on file    Forced sexual activity: Not on file  Other Topics Concern  . Not on file  Social History Narrative  . Not on file    Review of Systems: All negative except as stated above in HPI.  Physical Exam: Vital signs: There were no vitals filed for this visit.   General:   Alert,  Well-developed, well-nourished, pleasant and cooperative in NAD Lungs:  Clear throughout to auscultation.   No wheezes, crackles, or rhonchi. No acute distress. Heart:  Regular rate and rhythm; no murmurs, clicks, rubs,  or  gallops. Abdomen: soft, nontender, nondistended, bowel sounds present Rectal:  Deferred  GI:  Lab Results: No results for input(s): WBC, HGB, HCT, PLT in the last 72 hours. BMET No results for input(s): NA, K, CL, CO2, GLUCOSE, BUN, CREATININE, CALCIUM in the last 72 hours. LFT No results for input(s): PROT, ALBUMIN, AST, ALT, ALKPHOS, BILITOT, BILIDIR, IBILI in the last 72 hours. PT/INR No results for input(s): LABPROT, INR in the last 72 hours.   Studies/Results: No results found.  Impression/Plan: - unspecified cirrhosis of the liver. - end-stage renal disease on hemodialysis.  Recommendations -------------------------- - Procedure with EGD today and possible band ligation.  Risks (bleeding, infection, bowel perforation that could require surgery, sedation-related changes in cardiopulmonary systems), benefits (identification and possible treatment of source of symptoms, exclusion of certain causes of symptoms), and alternatives (watchful waiting, radiographic imaging studies, empiric medical treatment)  were explained to patient and family in detail and patient wishes to proceed.    LOS: 0 days   Otis Brace  MD, FACP 04/01/2018, 12:23 PM  Contact #  [phone]

## 2018-04-01 NOTE — Transfer of Care (Signed)
Immediate Anesthesia Transfer of Care Note  Patient: William [name]  Procedure(s) Performed: ESOPHAGOGASTRODUODENOSCOPY (EGD) WITH PROPOFOL (N/A ) ESOPHAGEAL BANDING (N/A )  Patient Location: PACU  Anesthesia Type:MAC  Level of Consciousness: sedated, patient cooperative and responds to stimulation  Airway & Oxygen Therapy: Patient Spontanous Breathing and Patient connected to nasal cannula oxygen  Post-op Assessment: Report given to RN and Post -op Vital signs reviewed and stable  Post vital signs: Reviewed and stable  Last Vitals:  Vitals Value Taken Time  BP    Temp    Pulse 62 04/01/2018  1:11 PM  Resp 15 04/01/2018  1:11 PM  SpO2 99 % 04/01/2018  1:11 PM  Vitals shown include unvalidated device data.  Last Pain:  Vitals:   04/01/18 1311  TempSrc: Oral  PainSc:          Complications: No apparent anesthesia complications

## 2018-04-01 NOTE — Discharge Instructions (Signed)
YOU HAD AN ENDOSCOPIC PROCEDURE TODAY: Refer to the procedure report and other information in the discharge instructions given to you for any specific questions about what was found during the examination. If this information does not answer your questions, please call [facility] office at [phone] to clarify.   YOU SHOULD EXPECT: Some feelings of bloating in the abdomen. Passage of more gas than usual. Walking can help get rid of the air that was put into your GI tract during the procedure and reduce the bloating. If you had a lower endoscopy (such as a colonoscopy or flexible sigmoidoscopy) you may notice spotting of blood in your stool or on the toilet paper. Some abdominal soreness may be present for a day or two, also.  DIET: Your first meal following the procedure should be a light meal and then it is ok to progress to your normal diet. A half-sandwich or bowl of soup is an example of a good first meal. Heavy or fried foods are harder to digest and may make you feel nauseous or bloated. Drink plenty of fluids but you should avoid alcoholic beverages for 24 hours. If you had a esophageal dilation, please see attached instructions for diet.   ACTIVITY: Your care partner should take you home directly after the procedure. You should plan to take it easy, moving slowly for the rest of the day. You can resume normal activity the day after the procedure however YOU SHOULD NOT DRIVE, use power tools, machinery or perform tasks that involve climbing or major physical exertion for 24 hours (because of the sedation medicines used during the test).   SYMPTOMS TO REPORT IMMEDIATELY: A gastroenterologist can be reached at any hour. Please call [phone]  for any of the following symptoms:    Following upper endoscopy (EGD, EUS, ERCP, esophageal dilation) Vomiting of blood or coffee ground material  New, significant abdominal pain  New, significant chest pain or pain under the shoulder blades  Painful or  persistently difficult swallowing  New shortness of breath  Black, tarry-looking or red, bloody stools  FOLLOW UP:  If any biopsies were taken you will be contacted by phone or by letter within the next 1-3 weeks. Call [phone]  if you have not heard about the biopsies in 3 weeks.  Please also call with any specific questions about appointments or follow up tests.

## 2018-04-01 NOTE — Op Note (Addendum)
[facility] Patient Name: William [name] Procedure Date: 04/01/2018 MRN: [id number] Attending MD: Otis Brace , MD Date of Birth: 1951/03/02 CSN: [id number] Age: 67 Admit Type: Inpatient Procedure:                Upper GI endoscopy Indications:              Cirrhosis rule out esophageal varices Providers:                Otis Brace, MD, Burtis Junes, RN, Tinnie Gens,                            Technician, Herbie Drape, CRNA Referring MD:              Medicines:                Sedation Administered by an Anesthesia Professional Complications:            No immediate complications. Estimated Blood Loss:     Estimated blood loss was minimal. Procedure:                Pre-Anesthesia Assessment:                           - Prior to the procedure, a History and Physical                            was performed, and patient medications and                            allergies were reviewed. The patient's tolerance of                            previous anesthesia was also reviewed. The risks                            and benefits of the procedure and the sedation                            options and risks were discussed with the patient.                            All questions were answered, and informed consent                            was obtained. Prior Anticoagulants: The patient has                            taken no previous anticoagulant or antiplatelet                            agents. ASA Grade Assessment: III - A patient with                            severe systemic disease. After reviewing the risks  and benefits, the patient was deemed in                            satisfactory condition to undergo the procedure.                           After obtaining informed consent, the endoscope was                            passed under direct vision. Throughout the                            procedure, the patient's blood  pressure, pulse, and                            oxygen saturations were monitored continuously. The                            GIF-H190 ([id number]) Olympus adult endoscope was                            introduced through the mouth, and advanced to the                            second part of duodenum. The upper GI endoscopy was                            accomplished without difficulty. The patient                            tolerated the procedure well. Scope In: Scope Out: Findings:      Two columns of non-bleeding large (> 5 mm) varices were found in the mid       esophagus and in the distal esophagus,. No stigmata of recent bleeding       were evident and no red wale signs were present. Two bands were       successfully placed with incomplete eradication of varices.      Large Type 2 gastroesophageal varices (GOV2, esophageal varices which       extend along the fundus) with no bleeding were found in the gastric       fundus. There were no stigmata of recent bleeding.      Scattered mild inflammation was found in the gastric body and in the       gastric antrum. Biopsies were taken with a cold forceps for histology.      The duodenal bulb, first portion of the duodenum and second portion of       the duodenum were normal. Impression:               - Non-bleeding large (> 5 mm) esophageal varices.                            Incompletely eradicated. Banded.                           -  Type 2 gastroesophageal varices (GOV2, esophageal                            varices which extend along the fundus), without                            bleeding.                           - Gastritis. Biopsied.                           - Normal duodenal bulb, first portion of the                            duodenum and second portion of the duodenum. Moderate Sedation:      Moderate (conscious) sedation was personally administered by an       anesthesia professional. The following parameters were  monitored: oxygen       saturation, heart rate, blood pressure, and response to care. Recommendation:           - Patient has a contact number available for                            emergencies. The signs and symptoms of potential                            delayed complications were discussed with the                            patient. Return to normal activities tomorrow.                            Written discharge instructions were provided to the                            patient.                           - Resume previous diet.                           - Continue present medications.                           - Await pathology results.                           - Repeat upper endoscopy in 4 weeks for                            retreatment. Currently on carvedilol 12.5 mg twice                            a day. Procedure Code(s):        --- Professional ---  43244, Esophagogastroduodenoscopy, flexible,                            transoral; with band ligation of esophageal/gastric                            varices                           43239, Esophagogastroduodenoscopy, flexible,                            transoral; with biopsy, single or multiple Diagnosis Code(s):        --- Professional ---                           K74.60, Unspecified cirrhosis of liver                           I85.10, Secondary esophageal varices without                            bleeding                           I86.4, Gastric varices                           K29.70, Gastritis, unspecified, without bleeding CPT copyright 2017 American Medical Association. All rights reserved. The codes documented in this report are preliminary and upon coder review may  be revised to meet current compliance requirements. Otis Brace, MD Otis Brace, MD 04/01/2018 1:15:34 PM Number of Addenda: 0

## 2018-04-02 DIAGNOSIS — N186 End stage renal disease: Secondary | ICD-10-CM | POA: Diagnosis not present

## 2018-04-02 DIAGNOSIS — D509 Iron deficiency anemia, unspecified: Secondary | ICD-10-CM | POA: Diagnosis not present

## 2018-04-02 DIAGNOSIS — D631 Anemia in chronic kidney disease: Secondary | ICD-10-CM | POA: Diagnosis not present

## 2018-04-02 DIAGNOSIS — N2581 Secondary hyperparathyroidism of renal origin: Secondary | ICD-10-CM | POA: Diagnosis not present

## 2018-04-03 ENCOUNTER — Encounter (HOSPITAL_COMMUNITY): Payer: Self-pay | Admitting: Gastroenterology

## 2018-04-04 DIAGNOSIS — N2581 Secondary hyperparathyroidism of renal origin: Secondary | ICD-10-CM | POA: Diagnosis not present

## 2018-04-04 DIAGNOSIS — D631 Anemia in chronic kidney disease: Secondary | ICD-10-CM | POA: Diagnosis not present

## 2018-04-04 DIAGNOSIS — N186 End stage renal disease: Secondary | ICD-10-CM | POA: Diagnosis not present

## 2018-04-04 DIAGNOSIS — D509 Iron deficiency anemia, unspecified: Secondary | ICD-10-CM | POA: Diagnosis not present

## 2018-04-05 DIAGNOSIS — N186 End stage renal disease: Secondary | ICD-10-CM | POA: Diagnosis not present

## 2018-04-05 DIAGNOSIS — Z992 Dependence on renal dialysis: Secondary | ICD-10-CM | POA: Diagnosis not present

## 2018-04-05 DIAGNOSIS — E1122 Type 2 diabetes mellitus with diabetic chronic kidney disease: Secondary | ICD-10-CM | POA: Diagnosis not present

## 2018-04-07 DIAGNOSIS — Z23 Encounter for immunization: Secondary | ICD-10-CM | POA: Diagnosis not present

## 2018-04-07 DIAGNOSIS — N2581 Secondary hyperparathyroidism of renal origin: Secondary | ICD-10-CM | POA: Diagnosis not present

## 2018-04-07 DIAGNOSIS — N186 End stage renal disease: Secondary | ICD-10-CM | POA: Diagnosis not present

## 2018-04-07 DIAGNOSIS — D509 Iron deficiency anemia, unspecified: Secondary | ICD-10-CM | POA: Diagnosis not present

## 2018-04-07 DIAGNOSIS — D631 Anemia in chronic kidney disease: Secondary | ICD-10-CM | POA: Diagnosis not present

## 2018-04-09 DIAGNOSIS — D631 Anemia in chronic kidney disease: Secondary | ICD-10-CM | POA: Diagnosis not present

## 2018-04-09 DIAGNOSIS — N2581 Secondary hyperparathyroidism of renal origin: Secondary | ICD-10-CM | POA: Diagnosis not present

## 2018-04-09 DIAGNOSIS — N186 End stage renal disease: Secondary | ICD-10-CM | POA: Diagnosis not present

## 2018-04-09 DIAGNOSIS — Z23 Encounter for immunization: Secondary | ICD-10-CM | POA: Diagnosis not present

## 2018-04-09 DIAGNOSIS — D509 Iron deficiency anemia, unspecified: Secondary | ICD-10-CM | POA: Diagnosis not present

## 2018-04-11 DIAGNOSIS — N186 End stage renal disease: Secondary | ICD-10-CM | POA: Diagnosis not present

## 2018-04-11 DIAGNOSIS — Z23 Encounter for immunization: Secondary | ICD-10-CM | POA: Diagnosis not present

## 2018-04-11 DIAGNOSIS — D509 Iron deficiency anemia, unspecified: Secondary | ICD-10-CM | POA: Diagnosis not present

## 2018-04-11 DIAGNOSIS — D631 Anemia in chronic kidney disease: Secondary | ICD-10-CM | POA: Diagnosis not present

## 2018-04-11 DIAGNOSIS — N2581 Secondary hyperparathyroidism of renal origin: Secondary | ICD-10-CM | POA: Diagnosis not present

## 2018-04-14 DIAGNOSIS — D509 Iron deficiency anemia, unspecified: Secondary | ICD-10-CM | POA: Diagnosis not present

## 2018-04-14 DIAGNOSIS — N186 End stage renal disease: Secondary | ICD-10-CM | POA: Diagnosis not present

## 2018-04-14 DIAGNOSIS — D631 Anemia in chronic kidney disease: Secondary | ICD-10-CM | POA: Diagnosis not present

## 2018-04-14 DIAGNOSIS — Z23 Encounter for immunization: Secondary | ICD-10-CM | POA: Diagnosis not present

## 2018-04-14 DIAGNOSIS — N2581 Secondary hyperparathyroidism of renal origin: Secondary | ICD-10-CM | POA: Diagnosis not present

## 2018-04-16 DIAGNOSIS — D509 Iron deficiency anemia, unspecified: Secondary | ICD-10-CM | POA: Diagnosis not present

## 2018-04-16 DIAGNOSIS — D631 Anemia in chronic kidney disease: Secondary | ICD-10-CM | POA: Diagnosis not present

## 2018-04-16 DIAGNOSIS — N186 End stage renal disease: Secondary | ICD-10-CM | POA: Diagnosis not present

## 2018-04-16 DIAGNOSIS — Z23 Encounter for immunization: Secondary | ICD-10-CM | POA: Diagnosis not present

## 2018-04-16 DIAGNOSIS — N2581 Secondary hyperparathyroidism of renal origin: Secondary | ICD-10-CM | POA: Diagnosis not present

## 2018-04-18 DIAGNOSIS — N2581 Secondary hyperparathyroidism of renal origin: Secondary | ICD-10-CM | POA: Diagnosis not present

## 2018-04-18 DIAGNOSIS — N186 End stage renal disease: Secondary | ICD-10-CM | POA: Diagnosis not present

## 2018-04-18 DIAGNOSIS — D631 Anemia in chronic kidney disease: Secondary | ICD-10-CM | POA: Diagnosis not present

## 2018-04-18 DIAGNOSIS — Z23 Encounter for immunization: Secondary | ICD-10-CM | POA: Diagnosis not present

## 2018-04-18 DIAGNOSIS — D509 Iron deficiency anemia, unspecified: Secondary | ICD-10-CM | POA: Diagnosis not present

## 2018-04-21 ENCOUNTER — Ambulatory Visit (INDEPENDENT_AMBULATORY_CARE_PROVIDER_SITE_OTHER): Payer: Medicare Other | Admitting: Emergency Medicine

## 2018-04-21 ENCOUNTER — Other Ambulatory Visit: Payer: Self-pay

## 2018-04-21 ENCOUNTER — Encounter: Payer: Self-pay | Admitting: Emergency Medicine

## 2018-04-21 VITALS — BP 137/52 | HR 79 | Temp 98.3°F | Resp 16 | Wt 132.2 lb

## 2018-04-21 DIAGNOSIS — E1122 Type 2 diabetes mellitus with diabetic chronic kidney disease: Secondary | ICD-10-CM

## 2018-04-21 DIAGNOSIS — Z992 Dependence on renal dialysis: Secondary | ICD-10-CM | POA: Diagnosis not present

## 2018-04-21 DIAGNOSIS — J069 Acute upper respiratory infection, unspecified: Secondary | ICD-10-CM | POA: Diagnosis not present

## 2018-04-21 DIAGNOSIS — D509 Iron deficiency anemia, unspecified: Secondary | ICD-10-CM | POA: Diagnosis not present

## 2018-04-21 DIAGNOSIS — Z794 Long term (current) use of insulin: Secondary | ICD-10-CM | POA: Diagnosis not present

## 2018-04-21 DIAGNOSIS — N186 End stage renal disease: Secondary | ICD-10-CM | POA: Diagnosis not present

## 2018-04-21 DIAGNOSIS — D631 Anemia in chronic kidney disease: Secondary | ICD-10-CM | POA: Diagnosis not present

## 2018-04-21 DIAGNOSIS — H6691 Otitis media, unspecified, right ear: Secondary | ICD-10-CM

## 2018-04-21 DIAGNOSIS — N2581 Secondary hyperparathyroidism of renal origin: Secondary | ICD-10-CM | POA: Diagnosis not present

## 2018-04-21 DIAGNOSIS — Z23 Encounter for immunization: Secondary | ICD-10-CM | POA: Diagnosis not present

## 2018-04-21 DIAGNOSIS — R112 Nausea with vomiting, unspecified: Secondary | ICD-10-CM | POA: Diagnosis not present

## 2018-04-21 DIAGNOSIS — I10 Essential (primary) hypertension: Secondary | ICD-10-CM | POA: Diagnosis not present

## 2018-04-21 MED ORDER — HYDROCORTISONE-ACETIC ACID 1-2 % OT SOLN: 3.0000 [drp] | Freq: Three times a day (TID) | OTIC | 1 refills | Status: DC

## 2018-04-21 MED ORDER — AMLODIPINE BESYLATE 10 MG PO TABS: 10.0000 mg | ORAL_TABLET | Freq: Every day | ORAL | 3 refills | Status: DC

## 2018-04-21 MED ORDER — AZITHROMYCIN 250 MG PO TABS: ORAL_TABLET | ORAL | 0 refills | Status: DC

## 2018-04-21 MED ORDER — ONDANSETRON HCL 4 MG PO TABS: 4.0000 mg | ORAL_TABLET | Freq: Three times a day (TID) | ORAL | 0 refills | Status: DC | PRN

## 2018-04-21 NOTE — Patient Instructions (Signed)
Ear Drainage  Ear drainage means that ear wax, pus, blood, or other fluid comes out of the ear (discharge).  Follow these instructions at home:  Pay attention to any changes in your ear drainage. Take these actions to help with your condition:  ? Take over-the-counter and prescription medicines only as told by your doctor.  ? Do not use cotton-tipped swabs in your ear. Do not put any other objects in your ear.  ? Do not swim until your doctor says it is okay.  ? Before you shower, cover a cotton ball with petroleum jelly and put that in your ear. This helps to keep water out of your ear.  ? Avoid being around smoke.  ? Wash your hands before and after you touch your ears.  ? Keep all follow-up visits as told by your doctor. This is important.    Contact a doctor if:  ? You have more drainage.  ? You have ear pain.  ? You have a fever.  ? Your drainage is not getting better with treatment.  ? Your ear drainage is bloody, white, clear, or yellow.  ? Your ear is red or swollen.  Get help right away if:  ? You have very bad ear pain.  ? You have a very bad headache.  ? You throw up (vomit).  ? You feel dizzy.  ? You have a seizure.  ? You have new hearing loss.  This information is not intended to replace advice given to you by your health care provider. Make sure you discuss any questions you have with your health care provider.  Document Released: 01/09/2010 Document Revised: 12/28/2015 Document Reviewed: 10/25/2014  Elsevier Interactive Patient Education ? 2018 [employer]

## 2018-04-21 NOTE — Progress Notes (Signed)
William [name] 67 y.o.   Chief Complaint  Patient presents with  . Emesis    after breakfast and lunch yesterday  . Medication Refill    Amloipine    HISTORY OF PRESENT ILLNESS: This is a 67 y.o. male diabetic patient on dialysis complaining of to 3-day history of flulike symptoms with runny nose, sneezing, and coughing.  Also complaining of right ear pain with discharge.  Had dialysis today. Has a history of hypertension.  Ran out of amlodipine.  Needs a refill. Vomited twice yesterday after breakfast and lunch.  No vomiting today.  HPI   Prior to Admission medications   Medication Sig Start Date End Date Taking? Authorizing Provider  acetaminophen (TYLENOL) 650 MG CR tablet Take 650 mg by mouth every 8 (eight) hours as needed for pain.   Yes [provider]  amLODipine (NORVASC) 10 MG tablet Take 1 tablet (10 mg total) daily by mouth. 06/20/17 11/06/24 Yes Gildardo Pounds, NP  calcium acetate (PHOSLO) 667 MG capsule Take 2 capsules (1,334 mg total) by mouth 3 (three) times daily with meals. 01/26/18  Yes Purohit, Konrad Dolores, MD  carvedilol (COREG) 12.5 MG tablet Take 12.5 mg by mouth 2 (two) times daily with a meal.   Yes [provider]  ferrous gluconate (IRON 27) 240 (27 FE) MG tablet Take 240 mg by mouth daily.    Yes [provider]  insulin NPH-regular Human (NOVOLIN 70/30) (70-30) 100 UNIT/ML injection Inject 18 Units into the skin 2 (two) times daily with a meal. 11/18/17  Yes Regalado, Belkys A, MD  Multiple Vitamins-Minerals (MULTIVITAMIN WITH MINERALS) tablet Take 1 tablet by mouth daily.   Yes [provider]  Polyethyl Glycol-Propyl Glycol (SYSTANE) 0.4-0.3 % SOLN Place 1 drop into both eyes 3 (three) times daily as needed (for dry eyes).   Yes [provider]  lactulose (CHRONULAC) 10 GM/15ML solution Take 30 mLs (20 g total) by mouth 2 (two) times daily. Patient not taking: Reported on 03/09/2018 01/26/18   Purohit, Konrad Dolores, MD    pantoprazole (PROTONIX) 20 MG tablet Take 1 tablet (20 mg total) by mouth daily. Patient not taking: Reported on 04/21/2018 04/01/18 04/01/19  Otis Brace, MD    Allergies  Allergen Reactions  . Heparin Other (See Comments)    Patient is Muslim and is not permitted Pork derative due to religious belief)  . Pork-Derived Products Other (See Comments)    Patient does not eat pork due to religious beliefs    Patient Active Problem List   Diagnosis Date Noted  . ESRD on dialysis ([place]) 02/06/2018  . CKD (chronic kidney disease) stage 4, GFR 15-29 ml/min (HCC) 12/19/2017  . Cirrhosis of liver with ascites ([place]) 12/09/2017  . Goodpasture's syndrome ([place]) with associated hemoptysis 11/20/2017  . Acute on chronic diastolic CHF (congestive heart failure) ([place]) 11/06/2017  . Pancytopenia ([place]) 11/06/2017  . GERD (gastroesophageal reflux disease) 11/06/2017  . Essential hypertension 11/06/2017  . Type II diabetes mellitus with renal manifestations ([place]) 08/25/2015    Past Medical History:  Diagnosis Date  . Anemia   . CHF (congestive heart failure) ([place])   . Cirrhosis ([place])   . Cirrhosis ([place])   . CKD (chronic kidney disease), stage IV ([place])   . Goodpasture's disease [facility])    on outpatient plasmapheresis/notes 11/20/2017  . Grade I diastolic dysfunction 16/05/9603  . History of anemia due to chronic kidney disease   . History of blood transfusion X 1   "UGIB;  low blood count"  . History of plasmapheresis    "qod" (11/20/2017)  . Hypertension   . Pancytopenia ([place]) 08/20/2017  . Type II diabetes mellitus ([place])     Past Surgical History:  Procedure Laterality Date  . AV FISTULA PLACEMENT Left 12/26/2017   Procedure: LEFT BRACHIOCEPHALIC ARTERIOVENOUS (AV) FISTULA CREATION;  Surgeon: Conrad [place], MD;  Location: [place];  Service: Vascular;  Laterality: Left;  . BASCILIC VEIN TRANSPOSITION Left 02/16/2018   Procedure: BRACHIOBASILIC VEIN TRANSPOSITION SECOND STAGE;  Surgeon: Conrad Holley, MD;  Location: [place];  Service: Vascular;  Laterality: Left;  . CATARACT EXTRACTION W/ INTRAOCULAR LENS  IMPLANT, BILATERAL Bilateral   . ESOPHAGEAL BANDING N/A 04/01/2018   Procedure: ESOPHAGEAL BANDING;  Surgeon: Otis Brace, MD;  [facility];  Service: Gastroenterology;  Laterality: N/A;  . ESOPHAGOGASTRODUODENOSCOPY (EGD) WITH PROPOFOL N/A 04/01/2018   Procedure: ESOPHAGOGASTRODUODENOSCOPY (EGD) WITH PROPOFOL;  Surgeon: Otis Brace, MD;  [facility];  Service: Gastroenterology;  Laterality: N/A;  . I&D EXTREMITY Left 02/18/2018   Procedure: IRRIGATION AND DEBRIDEMENT LEFT ARM;  Surgeon: Serafina Mitchell, MD;  Location: [place];  Service: Vascular;  Laterality: Left;  . INSERTION OF DIALYSIS CATHETER N/A 01/19/2018   Procedure: INSERTION OF DIALYSIS CATHETER - RIGHT INTERNAL JUGULAR PLACEMENT;  Surgeon: Rosetta Posner, MD;  Location: [place];  Service: Vascular;  Laterality: N/A;  . IR FLUORO GUIDE CV LINE RIGHT  11/10/2017  . IR PARACENTESIS  12/10/2017  . IR PARACENTESIS  12/26/2017  . IR REMOVAL TUN CV CATH W/O FL  11/28/2017  . IR US GUIDE VASC ACCESS RIGHT  11/10/2017  . NECK SURGERY  2007   "back of neck; no hardware in there"    Social History   Socioeconomic History  . Marital status: Married    Spouse name: Not on file  . Number of children: Not on file  . Years of education: Not on file  . Highest education level: Not on file  Occupational History  . Not on file  Social Needs  . Financial resource strain: Not on file  . Food insecurity:    Worry: Not on file    Inability: Not on file  . Transportation needs:    Medical: Not on file    Non-medical: Not on file  Tobacco Use  . Smoking status: Never Smoker  . Smokeless tobacco: Never Used  Substance and Sexual Activity  . Alcohol use: Never    Frequency: Never  . Drug use: Never  . Sexual activity: Not on file  Lifestyle  . Physical activity:    Days per week: Not on file    Minutes  per session: Not on file  . Stress: Not on file  Relationships  . Social connections:    Talks on phone: Not on file    Gets together: Not on file    Attends religious service: Not on file    Active member of club or organization: Not on file    Attends meetings of clubs or organizations: Not on file    Relationship status: Not on file  . Intimate partner violence:    Fear of current or ex partner: Not on file    Emotionally abused: Not on file    Physically abused: Not on file    Forced sexual activity: Not on file  Other Topics Concern  . Not on file  Social History Narrative  . Not on file    Family History  Problem Relation Age of Onset  . Diabetes Mellitus II Sister   . Stroke Brother      Review of Systems  Constitutional: Negative for chills and fever.  HENT: Positive for congestion, ear discharge and ear pain. Negative for sore throat.   Eyes: Negative.  Negative for discharge and redness.  Respiratory: Positive for cough. Negative for shortness of breath.   Cardiovascular: Negative.  Negative for chest pain and palpitations.  Gastrointestinal: Positive for nausea and vomiting. Negative for abdominal pain and diarrhea.  Musculoskeletal: Negative.  Negative for myalgias and neck pain.  Skin: Negative.  Negative for rash.  Neurological: Negative.  Negative for dizziness, loss of consciousness and headaches.  All other systems reviewed and are negative.   Vitals:   04/21/18 1704  BP: (!) 137/52  Pulse: 79  Resp: 16  Temp: 98.3 F (36.8 C)  SpO2: 96%    Physical Exam  Constitutional: He is oriented to person, place, and time. He appears well-developed and well-nourished.  HENT:  Head: Normocephalic and atraumatic.  Right Ear: External ear normal. There is drainage. Tympanic membrane is injected.  Left Ear: External ear normal.  Eyes: Pupils are equal, round, and reactive to light. EOM are normal.  Neck: Normal range of motion. Neck supple.    Cardiovascular: Normal rate and regular rhythm.  Pulmonary/Chest: Effort normal and breath sounds normal. No respiratory distress.  Abdominal: Soft. There is no tenderness.  Musculoskeletal: Normal range of motion.  Neurological: He is alert and oriented to person, place, and time. No sensory deficit. He exhibits normal muscle tone.  Skin: Skin is warm and dry. Capillary refill takes less than 2 seconds.  Psychiatric: He has a normal mood and affect. His behavior is normal.  Vitals reviewed.    ASSESSMENT & PLAN: Kitt was seen today for emesis and medication refill.  Diagnoses and all orders for this visit:  Right otitis media, unspecified otitis media type -     azithromycin (ZITHROMAX) 250 MG tablet; Sig as indicated -     acetic acid-hydrocortisone (VOSOL-HC) OTIC solution; Place 3 drops into the left ear 3 (three) times daily.  Essential hypertension -     amLODipine (NORVASC) 10 MG tablet; Take 1 tablet (10 mg total) by mouth daily.  Non-intractable vomiting with nausea, unspecified vomiting type -     ondansetron (ZOFRAN) 4 MG tablet; Take 1 tablet (4 mg total) by mouth every 8 (eight) hours as needed for nausea or vomiting.  Acute upper respiratory infection  Type 2 diabetes mellitus with chronic kidney disease on chronic dialysis, with long-term current use of insulin [facility])    Patient Instructions  Ear Drainage Ear drainage means that ear wax, pus, blood, or other fluid comes out of the ear (discharge). Follow these instructions at home: Pay attention to any changes in your ear drainage. Take these actions to help with your condition:  Take over-the-counter and prescription medicines only as told by your doctor.  Do not use cotton-tipped swabs in your ear. Do not put any other objects in your ear.  Do not swim until your doctor says it is okay.  Before you shower, cover a cotton ball with petroleum jelly and put that in your ear. This helps to keep water out  of your ear.  Avoid being around smoke.  Wash your hands before and after you touch your ears.  Keep all follow-up visits as told by your doctor. This is important.  Contact a doctor if:  You  have more drainage.  You have ear pain.  You have a fever.  Your drainage is not getting better with treatment.  Your ear drainage is bloody, white, clear, or yellow.  Your ear is red or swollen. Get help right away if:  You have very bad ear pain.  You have a very bad headache.  You throw up (vomit).  You feel dizzy.  You have a seizure.  You have new hearing loss. This information is not intended to replace advice given to you by your health care provider. Make sure you discuss any questions you have with your health care provider. Document Released: 01/09/2010 Document Revised: 12/28/2015 Document Reviewed: 10/25/2014 Elsevier Interactive Patient Education  2018 Elsevier Inc.      Agustina Caroli, MD [facility]

## 2018-04-23 DIAGNOSIS — N2581 Secondary hyperparathyroidism of renal origin: Secondary | ICD-10-CM | POA: Diagnosis not present

## 2018-04-23 DIAGNOSIS — D631 Anemia in chronic kidney disease: Secondary | ICD-10-CM | POA: Diagnosis not present

## 2018-04-23 DIAGNOSIS — Z23 Encounter for immunization: Secondary | ICD-10-CM | POA: Diagnosis not present

## 2018-04-23 DIAGNOSIS — D509 Iron deficiency anemia, unspecified: Secondary | ICD-10-CM | POA: Diagnosis not present

## 2018-04-23 DIAGNOSIS — N186 End stage renal disease: Secondary | ICD-10-CM | POA: Diagnosis not present

## 2018-04-25 DIAGNOSIS — N2581 Secondary hyperparathyroidism of renal origin: Secondary | ICD-10-CM | POA: Diagnosis not present

## 2018-04-25 DIAGNOSIS — D509 Iron deficiency anemia, unspecified: Secondary | ICD-10-CM | POA: Diagnosis not present

## 2018-04-25 DIAGNOSIS — Z23 Encounter for immunization: Secondary | ICD-10-CM | POA: Diagnosis not present

## 2018-04-25 DIAGNOSIS — N186 End stage renal disease: Secondary | ICD-10-CM | POA: Diagnosis not present

## 2018-04-25 DIAGNOSIS — D631 Anemia in chronic kidney disease: Secondary | ICD-10-CM | POA: Diagnosis not present

## 2018-04-26 ENCOUNTER — Ambulatory Visit (HOSPITAL_COMMUNITY)
Admission: EM | Admit: 2018-04-26 | Discharge: 2018-04-26 | Disposition: A | Discharge status: Home / Self Care | Payer: Medicare Other | Attending: Family Medicine | Admitting: Family Medicine

## 2018-04-26 ENCOUNTER — Encounter (HOSPITAL_COMMUNITY): Payer: Self-pay

## 2018-04-26 DIAGNOSIS — H6691 Otitis media, unspecified, right ear: Secondary | ICD-10-CM

## 2018-04-26 DIAGNOSIS — R112 Nausea with vomiting, unspecified: Secondary | ICD-10-CM | POA: Diagnosis not present

## 2018-04-26 DIAGNOSIS — H7291 Unspecified perforation of tympanic membrane, right ear: Secondary | ICD-10-CM

## 2018-04-26 MED ORDER — NEOMYCIN-POLYMYXIN-HC 3.5-10000-1 OT SUSP: 4.0000 [drp] | Freq: Three times a day (TID) | OTIC | 1 refills | Status: DC

## 2018-04-26 MED ORDER — CIPROFLOXACIN HCL 250 MG PO TABS: 250.0000 mg | ORAL_TABLET | Freq: Every day | ORAL | 0 refills | Status: DC

## 2018-04-26 NOTE — ED Triage Notes (Signed)
Pt presents with an upper respiratory cold that he is currently on antibiotics for; pt still has a persistent cough, ear pain in both ears and is vomiting.

## 2018-04-26 NOTE — ED Provider Notes (Signed)
William [name]    CSN: [id number] Arrival date & time: 04/26/18  1506     History   Chief Complaint Chief Complaint  Patient presents with  . Otalgia  . Emesis    HPI William [name] is a 67 y.o. male.   67 yo man from [place] seen with his son for persistent left ear drainage and recurrent nausea and vomiting.  He was seen 10 days ago and given azithromycin which seemed to help for awhile with vomiting resolving and less drainage.  The two latter symptoms returned yesterday.    No fever, diarrhea, abdominal pain, vertigo  Patient also lost his position when sitting this morning and fell from the chair, bruising his bottom.     Past Medical History:  Diagnosis Date  . Anemia   . CHF (congestive heart failure) ([place])   . Cirrhosis ([place])   . Cirrhosis ([place])   . CKD (chronic kidney disease), stage IV ([place])   . Goodpasture's disease [facility])    on outpatient plasmapheresis/notes 11/20/2017  . Grade I diastolic dysfunction 95/62/1308  . History of anemia due to chronic kidney disease   . History of blood transfusion X 1   "UGIB; low blood count"  . History of plasmapheresis    "qod" (11/20/2017)  . Hypertension   . Pancytopenia ([place]) 08/20/2017  . Type II diabetes mellitus [facility])     Patient Active Problem List   Diagnosis Date Noted  . Right otitis media 04/21/2018  . Non-intractable vomiting with nausea 04/21/2018  . Acute upper respiratory infection 04/21/2018  . ESRD on dialysis ([place]) 02/06/2018  . CKD (chronic kidney disease) stage 4, GFR 15-29 ml/min (HCC) 12/19/2017  . Cirrhosis of liver with ascites ([place]) 12/09/2017  . Goodpasture's syndrome ([place]) with associated hemoptysis 11/20/2017  . Acute on chronic diastolic CHF (congestive heart failure) ([place]) 11/06/2017  . Pancytopenia ([place]) 11/06/2017  . GERD (gastroesophageal reflux disease) 11/06/2017  . Essential hypertension 11/06/2017  . Type II diabetes mellitus with renal manifestations ([place]) 08/25/2015     Past Surgical History:  Procedure Laterality Date  . AV FISTULA PLACEMENT Left 12/26/2017   Procedure: LEFT BRACHIOCEPHALIC ARTERIOVENOUS (AV) FISTULA CREATION;  Surgeon: Conrad Sigourney, MD;  Location: [place];  Service: Vascular;  Laterality: Left;  . BASCILIC VEIN TRANSPOSITION Left 02/16/2018   Procedure: BRACHIOBASILIC VEIN TRANSPOSITION SECOND STAGE;  Surgeon: Conrad [place], MD;  Location: [place];  Service: Vascular;  Laterality: Left;  . CATARACT EXTRACTION W/ INTRAOCULAR LENS  IMPLANT, BILATERAL Bilateral   . ESOPHAGEAL BANDING N/A 04/01/2018   Procedure: ESOPHAGEAL BANDING;  Surgeon: Otis Brace, MD;  [facility];  Service: Gastroenterology;  Laterality: N/A;  . ESOPHAGOGASTRODUODENOSCOPY (EGD) WITH PROPOFOL N/A 04/01/2018   Procedure: ESOPHAGOGASTRODUODENOSCOPY (EGD) WITH PROPOFOL;  Surgeon: Otis Brace, MD;  [facility];  Service: Gastroenterology;  Laterality: N/A;  . I&D EXTREMITY Left 02/18/2018   Procedure: IRRIGATION AND DEBRIDEMENT LEFT ARM;  Surgeon: Serafina Mitchell, MD;  Location: [place];  Service: Vascular;  Laterality: Left;  . INSERTION OF DIALYSIS CATHETER N/A 01/19/2018   Procedure: INSERTION OF DIALYSIS CATHETER - RIGHT INTERNAL JUGULAR PLACEMENT;  Surgeon: Rosetta Posner, MD;  Location: [place];  Service: Vascular;  Laterality: N/A;  . IR FLUORO GUIDE CV LINE RIGHT  11/10/2017  . IR PARACENTESIS  12/10/2017  . IR PARACENTESIS  12/26/2017  . IR REMOVAL TUN CV CATH W/O FL  11/28/2017  . IR US GUIDE VASC ACCESS RIGHT  11/10/2017  .  NECK SURGERY  2007   "back of neck; no hardware in there"       Home Medications    Prior to Admission medications   Medication Sig Start Date End Date Taking? Authorizing Provider  acetaminophen (TYLENOL) 650 MG CR tablet Take 650 mg by mouth every 8 (eight) hours as needed for pain.    [provider]  acetic acid-hydrocortisone (VOSOL-HC) OTIC solution Place 3 drops into the left ear 3 (three) times  daily. 04/21/18   Horald Pollen, MD  amLODipine (NORVASC) 10 MG tablet Take 1 tablet (10 mg total) by mouth daily. 04/21/18 07/20/18  Horald Pollen, MD  azithromycin [facility]) 250 MG tablet Sig as indicated 04/21/18   Horald Pollen, MD  calcium acetate (PHOSLO) 667 MG capsule Take 2 capsules (1,334 mg total) by mouth 3 (three) times daily with meals. 01/26/18   Purohit, Konrad Dolores, MD  carvedilol (COREG) 12.5 MG tablet Take 12.5 mg by mouth 2 (two) times daily with a meal.    [provider]  ferrous gluconate (IRON 27) 240 (27 FE) MG tablet Take 240 mg by mouth daily.     [provider]  insulin NPH-regular Human (NOVOLIN 70/30) (70-30) 100 UNIT/ML injection Inject 18 Units into the skin 2 (two) times daily with a meal. 11/18/17   Regalado, Belkys A, MD  Multiple Vitamins-Minerals (MULTIVITAMIN WITH MINERALS) tablet Take 1 tablet by mouth daily.    [provider]  ondansetron (ZOFRAN) 4 MG tablet Take 1 tablet (4 mg total) by mouth every 8 (eight) hours as needed for nausea or vomiting. 04/21/18   Horald Pollen, MD  Polyethyl Glycol-Propyl Glycol (SYSTANE) 0.4-0.3 % SOLN Place 1 drop into both eyes 3 (three) times daily as needed (for dry eyes).    [provider]    Family History Family History  Problem Relation Age of Onset  . Diabetes Mellitus II Sister   . Stroke Brother     Social History Social History   Tobacco Use  . Smoking status: Never Smoker  . Smokeless tobacco: Never Used  Substance Use Topics  . Alcohol use: Never    Frequency: Never  . Drug use: Never     Allergies   Heparin and Pork-derived products   Review of Systems Review of Systems  Constitutional: Positive for appetite change. Negative for chills and fever.  HENT: Positive for ear discharge.   Respiratory: Negative.   Cardiovascular: Negative for chest pain.  Gastrointestinal: Positive for nausea and vomiting. Negative for abdominal pain.   Genitourinary: Negative.   Musculoskeletal: Positive for back pain.  Neurological: Negative for dizziness, syncope and headaches.  All other systems reviewed and are negative.    Physical Exam Triage Vital Signs ED Triage Vitals [04/26/18 1622]  Enc Vitals Group     BP (!) 150/59     Pulse Rate 71     Resp 18     Temp 98.1 F (36.7 C)     Temp Source Oral     SpO2 100 %     Weight      Height      Head Circumference      Peak Flow      Pain Score      Pain Loc      Pain Edu?      Excl. in [place]?    No data found.  Updated Vital Signs BP (!) 150/59 (BP Location: Right Arm)   Pulse 71  Temp 98.1 F (36.7 C) (Oral)   Resp 18   SpO2 100%   Physical Exam  Constitutional: He is oriented to person, place, and time. He appears well-developed and well-nourished.  HENT:  Left Ear: External ear normal.  Mouth/Throat: Oropharynx is clear and moist.  Right otorrhea with perforated TM and chronic scarring of TM  Eyes: Pupils are equal, round, and reactive to light. Conjunctivae are normal.  Neck: Normal range of motion. Neck supple.  Cardiovascular: Normal rate, regular rhythm and normal heart sounds.  Pulmonary/Chest: Effort normal and breath sounds normal.  Abdominal: Soft. He exhibits no mass. There is no tenderness.  Musculoskeletal: Normal range of motion.  Neurological: He is alert and oriented to person, place, and time.  Skin: Skin is warm and dry.  No gluteal ecchymosis or swelling  Nursing note and vitals reviewed.    UC Treatments / Results  Labs (all labs ordered are listed, but only abnormal results are displayed) Labs Reviewed - No data to display  EKG None  Radiology No results found.  Procedures Procedures (including critical care time)  Medications Ordered in UC Medications - No data to display  Initial Impression / Assessment and Plan / UC Course  I have reviewed the triage vital signs and the nursing notes.  Pertinent labs & imaging  results that were available during my care of the patient were reviewed by me and considered in my medical decision making (see chart for details).    Final Clinical Impressions(s) / UC Diagnoses   Final diagnoses:  None   Discharge Instructions   None    ED Prescriptions    None     Controlled Substance Prescriptions [place] Controlled Substance Registry consulted? Not Applicable   Robyn Haber, MD 04/26/18 [phone]

## 2018-04-26 NOTE — Progress Notes (Signed)
Patient ID: William [name], male   DOB: 01/28/1951, 67 y.o.   MRN: [id number]          Reason for Appointment: Consultation for Type 2 Diabetes  Referring physician: Dr. Carole Civil   History of Present Illness:          Date of diagnosis of type 2 diabetes mellitus: 1990s       Background history:   He has been on insulin for about 15 years, previously taking unknown oral agents Has been usually taking insulin with a syringe and only one type of premixed insulin Previous A1c has ranged between 6.6 and 9.2 in the last 2 years that records are available  Recent history:   Most recent A1c is 7% done in 8/19  INSULIN regimen is:  10-18 units of Novolin 70/30 AC twice daily      Current management, blood sugar patterns and problems identified:  He is taking insulin with a syringe and taking this just before eating breakfast and suppertime  However 3 days a week when he is going to dialysis at about noon he will take the first dose of insulin only in the late afternoon when he comes back from dialysis and not in the morning; this is because of tendency to low sugars when he is in dialysis  He has not brought a record of his blood sugars and recall for his blood sugars is incomplete, his son is helping him with his day-to-day management  He says he is adjusting his insulin dose based on blood sugar before eating and may take anywhere between 10-18 units  His injections are usually done in the upper arm on the inner side and no other sites  Hypoglycemia has been infrequent and may occasionally be having mild hypoglycemia with reading about 60 on waking up  Does not check blood sugars AFTER his meals especially after supper           Side effects from medications have been: None known  Compliance with the medical regimen: Fair     Meal times are:  Breakfast is at 10 Lunch: 2.30 Dinner: 9.30                                                                                                                         Typical meal intake: Breakfast is  an egg, bread and some fruit.  His daughter-in-law is usually doing the cooking              Exercise:  Walking 15-30 minutes about every other day in the late afternoon  Glucose monitoring:  done  times a day         Glucometer: [employer].       Blood Glucose readings by recall  PREMEAL Breakfast Lunch Dinner Bedtime  Overall   Glucose range:  60-190   220-400    Median:        POST-MEAL PC Breakfast PC Lunch PC Dinner  Glucose  range: ? ? ?  Median:       Dietician visit, most recent: Never except some interaction in the hospital  Weight history:  Wt Readings from Last 3 Encounters:  04/27/18 134 lb (60.8 kg)  04/21/18 132 lb 3.2 oz (60 kg)  04/01/18 136 lb 3.9 oz (61.8 kg)    Glycemic control:   Lab Results  Component Value Date   HGBA1C 7.0 (H) 03/09/2018   HGBA1C 6.6 (H) 02/18/2018   HGBA1C 8.1 06/20/2017   Lab Results  Component Value Date   MICROALBUR 91.4 08/25/2015   LDLCALC 136 (H) 06/20/2017   CREATININE 4.04 (HH) 03/09/2018   No results found for: MICRALBCREAT  No results found for: FRUCTOSAMINE  No visits with results within 1 Week(s) from this visit.  Latest known visit with results is:  Admission on 04/01/2018, Discharged on 04/01/2018  Component Date Value Ref Range Status  . Sodium 04/01/2018 131* 135 - 145 mmol/L Final  . Potassium 04/01/2018 4.0  3.5 - 5.1 mmol/L Final  . Glucose, Bld 04/01/2018 105* 70 - 99 mg/dL Final  . HCT 04/01/2018 33.0* 39.0 - 52.0 % Final  . Hemoglobin 04/01/2018 11.2* 13.0 - 17.0 g/dL Final    Allergies as of 04/27/2018      Reactions   Heparin Other (See Comments)   Patient is Muslim and is not permitted Pork derative due to religious belief)   Pork-derived Products Other (See Comments)   Patient does not eat pork due to religious beliefs      Medication List        Accurate as of 04/27/18  3:58 PM. Always use your most recent med list.            acetaminophen 650 MG CR tablet Commonly known as:  TYLENOL Take 650 mg by mouth every 8 (eight) hours as needed for pain.   amLODipine 10 MG tablet Commonly known as:  NORVASC Take 1 tablet (10 mg total) by mouth daily.   calcium acetate 667 MG capsule Commonly known as:  PHOSLO Take 2 capsules (1,334 mg total) by mouth 3 (three) times daily with meals.   carvedilol 12.5 MG tablet Commonly known as:  COREG Take 12.5 mg by mouth 2 (two) times daily with a meal.   ciprofloxacin 250 MG tablet Commonly known as:  CIPRO Take 1 tablet (250 mg total) by mouth daily with breakfast.   insulin lispro 100 UNIT/ML injection Commonly known as:  HUMALOG Inject into the skin 3 (three) times daily before meals.   insulin NPH-regular Human (70-30) 100 UNIT/ML injection Commonly known as:  NOVOLIN 70/30 Inject 18 Units into the skin 2 (two) times daily with a meal.   Insulin Pen Needle 31G X 5 MM Misc Use four times daily to inject insulin   IRON 27 240 (27 FE) MG tablet Generic drug:  ferrous gluconate Take 240 mg by mouth daily.   multivitamin with minerals tablet Take 1 tablet by mouth daily.   neomycin-polymyxin-hydrocortisone 3.5-10000-1 OTIC suspension Commonly known as:  CORTISPORIN Place 4 drops into the right ear 3 (three) times daily.   ondansetron 4 MG tablet Commonly known as:  ZOFRAN Take 1 tablet (4 mg total) by mouth every 8 (eight) hours as needed for nausea or vomiting.   ONE TOUCH LANCETS Misc Use to test blood sugar three times daily   ONETOUCH VERIO test strip Generic drug:  glucose blood 1 each by Other route as needed for other. Use as instructed  rosuvastatin 5 MG tablet Commonly known as:  CRESTOR Take 1 tablet (5 mg total) by mouth daily.   SYSTANE 0.4-0.3 % Soln Generic drug:  Polyethyl Glycol-Propyl Glycol Place 1 drop into both eyes 3 (three) times daily as needed (for dry eyes).   TOUJEO MAX SOLOSTAR 300 UNIT/ML Sopn Generic drug:   Insulin Glargine Inject into the skin.       Allergies:  Allergies  Allergen Reactions  . Heparin Other (See Comments)    Patient is Muslim and is not permitted Pork derative due to religious belief)  . Pork-Derived Products Other (See Comments)    Patient does not eat pork due to religious beliefs    Past Medical History:  Diagnosis Date  . Anemia   . CHF (congestive heart failure) ([place])   . Cirrhosis ([place])   . Cirrhosis ([place])   . CKD (chronic kidney disease), stage IV ([place])   . Goodpasture's disease [facility])    on outpatient plasmapheresis/notes 11/20/2017  . Grade I diastolic dysfunction 29/47/6546  . History of anemia due to chronic kidney disease   . History of blood transfusion X 1   "UGIB; low blood count"  . History of plasmapheresis    "qod" (11/20/2017)  . Hypertension   . Pancytopenia ([place]) 08/20/2017  . Type II diabetes mellitus ([place])     Past Surgical History:  Procedure Laterality Date  . AV FISTULA PLACEMENT Left 12/26/2017   Procedure: LEFT BRACHIOCEPHALIC ARTERIOVENOUS (AV) FISTULA CREATION;  Surgeon: Conrad Montclair, MD;  Location: [place];  Service: Vascular;  Laterality: Left;  . BASCILIC VEIN TRANSPOSITION Left 02/16/2018   Procedure: BRACHIOBASILIC VEIN TRANSPOSITION SECOND STAGE;  Surgeon: Conrad [place]ch, MD;  Location: [place];  Service: Vascular;  Laterality: Left;  . CATARACT EXTRACTION W/ INTRAOCULAR LENS  IMPLANT, BILATERAL Bilateral   . ESOPHAGEAL BANDING N/A 04/01/2018   Procedure: ESOPHAGEAL BANDING;  Surgeon: Otis Brace, MD;  Location: [facility];  Service: Gastroenterology;  Laterality: N/A;  . ESOPHAGOGASTRODUODENOSCOPY (EGD) WITH PROPOFOL N/A 04/01/2018   Procedure: ESOPHAGOGASTRODUODENOSCOPY (EGD) WITH PROPOFOL;  Surgeon: Otis Brace, MD;  Location: [facility];  Service: Gastroenterology;  Laterality: N/A;  . I&D EXTREMITY Left 02/18/2018   Procedure: IRRIGATION AND DEBRIDEMENT LEFT ARM;  Surgeon: Serafina Mitchell, MD;  Location: [place];  Service: Vascular;  Laterality: Left;  . INSERTION OF DIALYSIS CATHETER N/A 01/19/2018   Procedure: INSERTION OF DIALYSIS CATHETER - RIGHT INTERNAL JUGULAR PLACEMENT;  Surgeon: Rosetta Posner, MD;  Location: [place];  Service: Vascular;  Laterality: N/A;  . IR FLUORO GUIDE CV LINE RIGHT  11/10/2017  . IR PARACENTESIS  12/10/2017  . IR PARACENTESIS  12/26/2017  . IR REMOVAL TUN CV CATH W/O FL  11/28/2017  . IR US GUIDE VASC ACCESS RIGHT  11/10/2017  . NECK SURGERY  2007   "back of neck; no hardware in there"    Family History  Problem Relation Age of Onset  . Diabetes Mellitus II Sister   . Diabetes Sister   . Stroke Brother   . Heart attack Brother     Social History:  reports that he has never smoked. He has never used smokeless tobacco. He reports that he does not drink alcohol or use drugs.   Review of Systems  Constitutional: Negative for weight loss.  Eyes: Negative for blurred vision.  Respiratory: Negative for shortness of breath.   Cardiovascular: Negative for leg swelling.  Gastrointestinal: Positive for diarrhea.       Has  mild occasional diarrhea  Endocrine: Negative for fatigue.  Genitourinary: Negative for frequency.  Musculoskeletal: Negative for joint pain.  Skin: Negative for rash.  Neurological: Negative for numbness.     Lipid history: Last LDL 136, also has family history of CAD and currently not on any medications    Lab Results  Component Value Date   CHOL 213 (H) 06/20/2017   HDL 34 (L) 06/20/2017   LDLCALC 136 (H) 06/20/2017   TRIG 216 (H) 06/20/2017   CHOLHDL 6.3 (H) 06/20/2017           He reportedly has cirrhosis of the liver from unknown cause  Lab Results  Component Value Date   ALT 13 03/09/2018   ALT 16 01/07/2018   Hypertension: Has been present for several years managed by nephrologist  BP Readings from Last 3 Encounters:  04/27/18 126/60  04/26/18 (!) 150/59  04/21/18 (!) 137/52    Most recent eye exam was in 7/19  Most  recent foot exam: 9/19  Currently known complications of diabetes: Nephropathy?,  Unknown status of retinopathy  On dialysis since about 5/19  LABS:  No visits with results within 1 Week(s) from this visit.  Latest known visit with results is:  Admission on 04/01/2018, Discharged on 04/01/2018  Component Date Value Ref Range Status  . Sodium 04/01/2018 131* 135 - 145 mmol/L Final  . Potassium 04/01/2018 4.0  3.5 - 5.1 mmol/L Final  . Glucose, Bld 04/01/2018 105* 70 - 99 mg/dL Final  . HCT 04/01/2018 33.0* 39.0 - 52.0 % Final  . Hemoglobin 04/01/2018 11.2* 13.0 - 17.0 g/dL Final    Physical Examination:  BP 126/60   Pulse 76   Ht 5' 4"  (1.626 m)   Wt 134 lb (60.8 kg)   SpO2 96%   BMI 23.00 kg/m   GENERAL:         Patient has generalized obesity.    HEENT:         Eye exam shows normal external appearance.  Fundus exam shows no retinopathy.  Oral exam shows normal mucosa .   NECK:   There is no lymphadenopathy Dialysis catheter present in the right jugular area Thyroid is not enlarged and no nodules felt.   Carotids are normal to palpation and no bruit heard  LUNGS:         Chest is symmetrical. Lungs are clear to auscultation.Marland Kitchen   HEART:         Heart sounds:  S1 and S2 are normal. No murmur or click heard., no S3 or S4.   ABDOMEN:   There is no distention present. Liver and spleen are not palpable.  No other mass or tenderness present.    NEUROLOGICAL:   Ankle jerks are absent bilaterally.    Diabetic Foot Exam - Simple   Simple Foot Form Diabetic Foot exam was performed with the following findings:  Yes   Visual Inspection No deformities, no ulcerations, no other skin breakdown bilaterally:  Yes Sensation Testing Intact to touch and monofilament testing bilaterally:  Yes Pulse Check Posterior Tibialis and Dorsalis pulse intact bilaterally:  Yes Comments Mild hypersensitivity for monofilament sensation at distal toes            Vibration sense is mildly  reduced in distal first toes but patient response appears to be somewhat unreliable.  MUSCULOSKELETAL:  There is no swelling or deformity of the peripheral joints.     EXTREMITIES:     There is no ankle edema.  SKIN:       No rash or lesions of concern.        ASSESSMENT:  Diabetes type 2, insulin-dependent with poor control  See history of present illness for detailed discussion of current diabetes management, blood sugar patterns and problems identified  Although his A1c is 7% by history he has had significant fears of hyperglycemia especially after meals during the day He is not getting consistent amount of basal insulin on the days of dialysis because of skipping the morning insulin  Also with the fixed regimen of premixed insulin he is not getting any consistent coverage for his postprandial hypoglycemia especially lunchtime Currently he has taken erratic doses of insulin only based on his pre-meal blood sugar readings which is further causing increased variability Currently because of lack of adequate glucose monitoring and home records not clear exactly what his blood sugar patterns are Also tends to have occasional low normal blood sugars waking up  He has little understanding of his diabetes, meal planning, timing of insulin and blood sugar targets  Complications of diabetes: Diabetic nephropathy with ESRD on dialysis  HYPERCHOLESTEROLEMIA: Untreated, with his diabetes, hypertension, ESRD and family history of coronary artery disease he is at high risk and needs to be on a statin drug   PLAN:    Change premixed insulin to basal bolus insulin regimen to improve blood sugar patterns, prevent hypoglycemia and much more consistent insulin action along with control of postprandial readings with every meal  He can adjust the basal insulin on the days of dialysis also to avoid hypoglycemia  Can start with 14 units of Toujeo insulin in the morning once a day on nondialysis days and  with 8 units on days of dialysis  After 1 week he will adjust the dose by 2 units if blood sugar still over 140 but also his fasting blood sugars are getting below 90 he can reduce by 2 units  He was given instruction booklet on how to use an insulin pen since he is not able to do this on his own as yet with using syringes  Encouraged him to start rotating his injection sites and stop using the inside of his upper arm  Mealtime coverage: Today discussed in detail the need for mealtime insulin to cover postprandial spikes, action of mealtime insulin, use of the insulin pen, timing and action of the rapid acting insulin as well as starting dose and dosage titration to target the two-hour reading of under 180  He was started 6 units 4 average meals and 8 units for his largest meal  If his blood sugars are consistently over 180 after meals he will need to increase the dose by another 2 units  However will need to have short-term follow-up to assess his understanding and compliance with his regimen and diabetes educator will see him next week  He will be given a new glucose monitor and prescription also sent for the One Touch Verio monitor, showed her how this would be used  For his hypercholesterolemia he will start 5 mg rosuvastatin and discussed benefits of statin drugs  Patient Instructions  Check blood sugars on waking up  Daily  Try to check blood sugar before lunch or dinner at least once a day  Also check blood sugars about 2 hours after dinner at least 3 days a week  Recommended blood sugar levels on waking up is 90-140 and about 2 hours after meal is 130-160  Please bring your blood sugar  monitor to each visit, thank you  Stop the 70/30 insulin and start the following  1.  BASAL insulin will be once a day and this can be taken in the morning  Start with 14 units in the morning daily If the blood sugar in the morning stays over 140 after 1 week increase the dose by 2  units  DIALYSIS days: Take only 8 units in the morning  2.  MEALTIME insulin: This will be taken before each meal  Take 6 units before each meal.  For larger meals take 8 units      Consultation note has been sent to the referring physician  Counseling time on subjects discussed in assessment and plan sections is over 50% of today's 60 minute visit  Elayne Snare 04/27/2018, 3:58 PM   Note: This office note was prepared with Dragon voice recognition system technology. Any transcriptional errors that result from this process are unintentional.

## 2018-04-27 ENCOUNTER — Other Ambulatory Visit: Payer: Self-pay | Admitting: Endocrinology

## 2018-04-27 ENCOUNTER — Other Ambulatory Visit: Payer: Self-pay

## 2018-04-27 ENCOUNTER — Encounter: Payer: Self-pay | Admitting: Endocrinology

## 2018-04-27 ENCOUNTER — Telehealth: Payer: Self-pay

## 2018-04-27 ENCOUNTER — Ambulatory Visit (INDEPENDENT_AMBULATORY_CARE_PROVIDER_SITE_OTHER): Payer: Medicare Other | Admitting: Endocrinology

## 2018-04-27 VITALS — BP 126/60 | HR 76 | Ht 64.0 in | Wt 134.0 lb

## 2018-04-27 DIAGNOSIS — E1165 Type 2 diabetes mellitus with hyperglycemia: Secondary | ICD-10-CM | POA: Diagnosis not present

## 2018-04-27 DIAGNOSIS — Z992 Dependence on renal dialysis: Secondary | ICD-10-CM | POA: Diagnosis not present

## 2018-04-27 DIAGNOSIS — E7800 Pure hypercholesterolemia, unspecified: Secondary | ICD-10-CM

## 2018-04-27 DIAGNOSIS — N186 End stage renal disease: Secondary | ICD-10-CM | POA: Diagnosis not present

## 2018-04-27 DIAGNOSIS — Z794 Long term (current) use of insulin: Secondary | ICD-10-CM

## 2018-04-27 MED ORDER — ROSUVASTATIN CALCIUM 5 MG PO TABS: 5.0000 mg | ORAL_TABLET | Freq: Every day | ORAL | 3 refills | Status: DC

## 2018-04-27 MED ORDER — INSULIN LISPRO 100 UNIT/ML (KWIKPEN): PEN_INJECTOR | SUBCUTANEOUS | 1 refills | Status: DC

## 2018-04-27 MED ORDER — INSULIN PEN NEEDLE 31G X 5 MM MISC: 1 refills | Status: AC

## 2018-04-27 MED ORDER — GLUCOSE BLOOD VI STRP: ORAL_STRIP | 12 refills | Status: DC

## 2018-04-27 MED ORDER — INSULIN GLARGINE 300 UNIT/ML ~~LOC~~ SOPN: 14.0000 [IU] | PEN_INJECTOR | Freq: Every day | SUBCUTANEOUS | 1 refills | Status: DC

## 2018-04-27 MED ORDER — ONETOUCH LANCETS MISC: 2 refills | Status: AC

## 2018-04-27 NOTE — Patient Instructions (Addendum)
Check blood sugars on waking up  Daily  Try to check blood sugar before lunch or dinner at least once a day  Also check blood sugars about 2 hours after dinner at least 3 days a week  Recommended blood sugar levels on waking up is 90-140 and about 2 hours after meal is 130-160  Please bring your blood sugar monitor to each visit, thank you  Stop the 70/30 insulin and start the following  1.  BASAL insulin will be once a day and this can be taken in the morning  Start with 14 units in the morning daily If the blood sugar in the morning stays over 140 after 1 week increase the dose by 2 units  DIALYSIS days: Take only 8 units in the morning  2.  MEALTIME insulin: This will be taken before each meal  Take 6 units before each meal.  For larger meals take 8 units

## 2018-04-28 ENCOUNTER — Other Ambulatory Visit: Payer: Self-pay

## 2018-04-28 DIAGNOSIS — D509 Iron deficiency anemia, unspecified: Secondary | ICD-10-CM | POA: Diagnosis not present

## 2018-04-28 DIAGNOSIS — N2581 Secondary hyperparathyroidism of renal origin: Secondary | ICD-10-CM | POA: Diagnosis not present

## 2018-04-28 DIAGNOSIS — Z23 Encounter for immunization: Secondary | ICD-10-CM | POA: Diagnosis not present

## 2018-04-28 DIAGNOSIS — D631 Anemia in chronic kidney disease: Secondary | ICD-10-CM | POA: Diagnosis not present

## 2018-04-28 DIAGNOSIS — N186 End stage renal disease: Secondary | ICD-10-CM | POA: Diagnosis not present

## 2018-04-28 MED ORDER — GLUCOSE BLOOD VI STRP: ORAL_STRIP | 12 refills | Status: DC

## 2018-04-30 DIAGNOSIS — D509 Iron deficiency anemia, unspecified: Secondary | ICD-10-CM | POA: Diagnosis not present

## 2018-04-30 DIAGNOSIS — Z23 Encounter for immunization: Secondary | ICD-10-CM | POA: Diagnosis not present

## 2018-04-30 DIAGNOSIS — D631 Anemia in chronic kidney disease: Secondary | ICD-10-CM | POA: Diagnosis not present

## 2018-04-30 DIAGNOSIS — N2581 Secondary hyperparathyroidism of renal origin: Secondary | ICD-10-CM | POA: Diagnosis not present

## 2018-04-30 DIAGNOSIS — N186 End stage renal disease: Secondary | ICD-10-CM | POA: Diagnosis not present

## 2018-05-02 DIAGNOSIS — D631 Anemia in chronic kidney disease: Secondary | ICD-10-CM | POA: Diagnosis not present

## 2018-05-02 DIAGNOSIS — N186 End stage renal disease: Secondary | ICD-10-CM | POA: Diagnosis not present

## 2018-05-02 DIAGNOSIS — D509 Iron deficiency anemia, unspecified: Secondary | ICD-10-CM | POA: Diagnosis not present

## 2018-05-02 DIAGNOSIS — N2581 Secondary hyperparathyroidism of renal origin: Secondary | ICD-10-CM | POA: Diagnosis not present

## 2018-05-02 DIAGNOSIS — Z23 Encounter for immunization: Secondary | ICD-10-CM | POA: Diagnosis not present

## 2018-05-05 ENCOUNTER — Telehealth: Payer: Self-pay | Admitting: Nutrition

## 2018-05-05 ENCOUNTER — Encounter: Payer: Medicare Other | Attending: Endocrinology | Admitting: Nutrition

## 2018-05-05 DIAGNOSIS — D509 Iron deficiency anemia, unspecified: Secondary | ICD-10-CM | POA: Diagnosis not present

## 2018-05-05 DIAGNOSIS — D631 Anemia in chronic kidney disease: Secondary | ICD-10-CM | POA: Diagnosis not present

## 2018-05-05 DIAGNOSIS — Z992 Dependence on renal dialysis: Secondary | ICD-10-CM | POA: Diagnosis not present

## 2018-05-05 DIAGNOSIS — N186 End stage renal disease: Secondary | ICD-10-CM | POA: Diagnosis not present

## 2018-05-05 DIAGNOSIS — E1165 Type 2 diabetes mellitus with hyperglycemia: Secondary | ICD-10-CM | POA: Diagnosis not present

## 2018-05-05 DIAGNOSIS — Z794 Long term (current) use of insulin: Secondary | ICD-10-CM | POA: Insufficient documentation

## 2018-05-05 DIAGNOSIS — Z713 Dietary counseling and surveillance: Secondary | ICD-10-CM | POA: Insufficient documentation

## 2018-05-05 DIAGNOSIS — E1121 Type 2 diabetes mellitus with diabetic nephropathy: Secondary | ICD-10-CM

## 2018-05-05 DIAGNOSIS — N2581 Secondary hyperparathyroidism of renal origin: Secondary | ICD-10-CM | POA: Diagnosis not present

## 2018-05-05 DIAGNOSIS — E1122 Type 2 diabetes mellitus with diabetic chronic kidney disease: Secondary | ICD-10-CM | POA: Diagnosis not present

## 2018-05-05 NOTE — Telephone Encounter (Signed)
Change Toujeo to 18 units at dinnertime The day before dialysis he will take 10 units If not eating more than a small amount of carbohydrate may skip the NovoLog in the afternoon and evenings Needs to make sure he has consistent amount of carbohydrate at breakfast

## 2018-05-05 NOTE — Telephone Encounter (Signed)
Written instructions were given per dr. Ronnie Derby written orders below.

## 2018-05-05 NOTE — Progress Notes (Signed)
Daughter-in-law is giving him his insulin in his feft arm only.  Patient will not allow any other areas.  He is given Toujeo 14u at 10AM.  On dialysis days, he is given 8u.   Novolog: 6u ac meals. Typical day: 9:30 up 10AM bfast: 1 piece of fruit, and 4 eggs, 1/2 cup of milk in his tea.    1PM: piece of fruit 2:30-3PM: lunch;  Protein 4-5 ounces, occasional starchy veg. Like potatoes.  Diet sprite or water to drink 6PM: piece of fruit 9-10PM: supper.  On dialysis, not hungry and eats on 2-3 pieces of fruit.  Blood sugar dropped low to 68 at MN last night.   No snacking after supper. Plan: Per Dr. Ronnie Derby voice order: 1.  Toujeo moved to acS and increased to 18u, on night before dialysis: 10u.   2.  No Novolog acS on nights of dialysis if not very hungry. 3.  Add more carbohydrate to all meals.  Suggestions given for this. They had no final questions.

## 2018-05-05 NOTE — Telephone Encounter (Signed)
Meter download shows FBSs in the high 200s to 399.   Taking Toujeo 14 qAM.  Patient Taking Novolog 6u ac meals.  Dropped low after supper last night, due to not eating much after dialysis.

## 2018-05-05 NOTE — Patient Instructions (Signed)
1.  Move Toujeo to before Supper (9PM)  and increased to 18u. On night before dialysis: 10u.   2.  No Novolog acS on nights of dialysis if not very hungry. 3.  Add more carbohydrate to all meals.

## 2018-05-06 DIAGNOSIS — Z452 Encounter for adjustment and management of vascular access device: Secondary | ICD-10-CM | POA: Diagnosis not present

## 2018-05-07 DIAGNOSIS — D631 Anemia in chronic kidney disease: Secondary | ICD-10-CM | POA: Diagnosis not present

## 2018-05-07 DIAGNOSIS — D509 Iron deficiency anemia, unspecified: Secondary | ICD-10-CM | POA: Diagnosis not present

## 2018-05-07 DIAGNOSIS — N2581 Secondary hyperparathyroidism of renal origin: Secondary | ICD-10-CM | POA: Diagnosis not present

## 2018-05-07 DIAGNOSIS — N186 End stage renal disease: Secondary | ICD-10-CM | POA: Diagnosis not present

## 2018-05-09 DIAGNOSIS — N2581 Secondary hyperparathyroidism of renal origin: Secondary | ICD-10-CM | POA: Diagnosis not present

## 2018-05-09 DIAGNOSIS — D631 Anemia in chronic kidney disease: Secondary | ICD-10-CM | POA: Diagnosis not present

## 2018-05-09 DIAGNOSIS — D509 Iron deficiency anemia, unspecified: Secondary | ICD-10-CM | POA: Diagnosis not present

## 2018-05-09 DIAGNOSIS — N186 End stage renal disease: Secondary | ICD-10-CM | POA: Diagnosis not present

## 2018-05-12 DIAGNOSIS — N186 End stage renal disease: Secondary | ICD-10-CM | POA: Diagnosis not present

## 2018-05-12 DIAGNOSIS — D631 Anemia in chronic kidney disease: Secondary | ICD-10-CM | POA: Diagnosis not present

## 2018-05-12 DIAGNOSIS — D509 Iron deficiency anemia, unspecified: Secondary | ICD-10-CM | POA: Diagnosis not present

## 2018-05-12 DIAGNOSIS — N2581 Secondary hyperparathyroidism of renal origin: Secondary | ICD-10-CM | POA: Diagnosis not present

## 2018-05-14 DIAGNOSIS — N186 End stage renal disease: Secondary | ICD-10-CM | POA: Diagnosis not present

## 2018-05-14 DIAGNOSIS — D631 Anemia in chronic kidney disease: Secondary | ICD-10-CM | POA: Diagnosis not present

## 2018-05-14 DIAGNOSIS — D509 Iron deficiency anemia, unspecified: Secondary | ICD-10-CM | POA: Diagnosis not present

## 2018-05-14 DIAGNOSIS — N2581 Secondary hyperparathyroidism of renal origin: Secondary | ICD-10-CM | POA: Diagnosis not present

## 2018-05-16 DIAGNOSIS — N2581 Secondary hyperparathyroidism of renal origin: Secondary | ICD-10-CM | POA: Diagnosis not present

## 2018-05-16 DIAGNOSIS — N186 End stage renal disease: Secondary | ICD-10-CM | POA: Diagnosis not present

## 2018-05-16 DIAGNOSIS — D509 Iron deficiency anemia, unspecified: Secondary | ICD-10-CM | POA: Diagnosis not present

## 2018-05-16 DIAGNOSIS — D631 Anemia in chronic kidney disease: Secondary | ICD-10-CM | POA: Diagnosis not present

## 2018-05-19 DIAGNOSIS — D631 Anemia in chronic kidney disease: Secondary | ICD-10-CM | POA: Diagnosis not present

## 2018-05-19 DIAGNOSIS — D509 Iron deficiency anemia, unspecified: Secondary | ICD-10-CM | POA: Diagnosis not present

## 2018-05-19 DIAGNOSIS — N2581 Secondary hyperparathyroidism of renal origin: Secondary | ICD-10-CM | POA: Diagnosis not present

## 2018-05-19 DIAGNOSIS — N186 End stage renal disease: Secondary | ICD-10-CM | POA: Diagnosis not present

## 2018-05-21 ENCOUNTER — Other Ambulatory Visit (INDEPENDENT_AMBULATORY_CARE_PROVIDER_SITE_OTHER): Payer: Medicare Other

## 2018-05-21 DIAGNOSIS — Z794 Long term (current) use of insulin: Secondary | ICD-10-CM | POA: Diagnosis not present

## 2018-05-21 DIAGNOSIS — D631 Anemia in chronic kidney disease: Secondary | ICD-10-CM | POA: Diagnosis not present

## 2018-05-21 DIAGNOSIS — N2581 Secondary hyperparathyroidism of renal origin: Secondary | ICD-10-CM | POA: Diagnosis not present

## 2018-05-21 DIAGNOSIS — N186 End stage renal disease: Secondary | ICD-10-CM | POA: Diagnosis not present

## 2018-05-21 DIAGNOSIS — E1165 Type 2 diabetes mellitus with hyperglycemia: Secondary | ICD-10-CM

## 2018-05-21 DIAGNOSIS — E1129 Type 2 diabetes mellitus with other diabetic kidney complication: Secondary | ICD-10-CM | POA: Diagnosis not present

## 2018-05-21 DIAGNOSIS — D509 Iron deficiency anemia, unspecified: Secondary | ICD-10-CM | POA: Diagnosis not present

## 2018-05-21 DIAGNOSIS — E7800 Pure hypercholesterolemia, unspecified: Secondary | ICD-10-CM

## 2018-05-21 LAB — LIPID PANEL
CHOL/HDL RATIO: 3
Cholesterol: 150 mg/dL (ref 0–200)
HDL: 44.8 mg/dL (ref >39.00)
LDL Cholesterol: 93 mg/dL (ref 0–99)
NonHDL: 104.74
TRIGLYCERIDES: 61 mg/dL (ref 0.0–149.0)
VLDL: 12.2 mg/dL (ref 0.0–40.0)

## 2018-05-21 LAB — GLUCOSE, RANDOM: Glucose, Bld: 231 mg/dL — ABNORMAL HIGH (ref 70–99)

## 2018-05-21 LAB — ALT: ALT: 21 U/L (ref 0–53)

## 2018-05-22 LAB — FRUCTOSAMINE: Fructosamine: 524 umol/L — ABNORMAL HIGH (ref 0–285)

## 2018-05-23 DIAGNOSIS — D631 Anemia in chronic kidney disease: Secondary | ICD-10-CM | POA: Diagnosis not present

## 2018-05-23 DIAGNOSIS — N186 End stage renal disease: Secondary | ICD-10-CM | POA: Diagnosis not present

## 2018-05-23 DIAGNOSIS — D509 Iron deficiency anemia, unspecified: Secondary | ICD-10-CM | POA: Diagnosis not present

## 2018-05-23 DIAGNOSIS — N2581 Secondary hyperparathyroidism of renal origin: Secondary | ICD-10-CM | POA: Diagnosis not present

## 2018-05-25 ENCOUNTER — Ambulatory Visit (INDEPENDENT_AMBULATORY_CARE_PROVIDER_SITE_OTHER): Payer: Medicare Other | Admitting: Internal Medicine

## 2018-05-25 ENCOUNTER — Encounter: Payer: Self-pay | Admitting: Internal Medicine

## 2018-05-25 VITALS — BP 130/68 | HR 73 | Ht 64.0 in | Wt 136.6 lb

## 2018-05-25 DIAGNOSIS — N186 End stage renal disease: Secondary | ICD-10-CM | POA: Diagnosis not present

## 2018-05-25 DIAGNOSIS — E11319 Type 2 diabetes mellitus with unspecified diabetic retinopathy without macular edema: Secondary | ICD-10-CM | POA: Diagnosis not present

## 2018-05-25 DIAGNOSIS — E1122 Type 2 diabetes mellitus with diabetic chronic kidney disease: Secondary | ICD-10-CM | POA: Diagnosis not present

## 2018-05-25 DIAGNOSIS — Z992 Dependence on renal dialysis: Secondary | ICD-10-CM

## 2018-05-25 DIAGNOSIS — Z794 Long term (current) use of insulin: Secondary | ICD-10-CM

## 2018-05-25 MED ORDER — INSULIN LISPRO 100 UNIT/ML (KWIKPEN): PEN_INJECTOR | SUBCUTANEOUS | 3 refills | Status: DC

## 2018-05-25 MED ORDER — INSULIN GLARGINE 300 UNIT/ML ~~LOC~~ SOPN: 12.0000 [IU] | PEN_INJECTOR | Freq: Every day | SUBCUTANEOUS | 1 refills | Status: DC

## 2018-05-25 NOTE — Progress Notes (Signed)
Name: William [name]  Age/ Sex: 67 y.o., male   MRN/ DOB: [id number], [birth year]     PCP: Horald Pollen, MD   Reason for Endocrinology Evaluation: Type 2 Diabetes Mellitus  Initial Endocrine Consultative Visit: 04/2018    PATIENT IDENTIFIER: William [name] is a 67 y.o. male with a past medical history of Good Pasture's Syndrome,liver cirrhosis ESRD on HD (Tuesday, Thursday and Saturday)and T2DM. The patient has followed with Endocrinology clinic since 04/2018 for consultative assistance with management of his diabetes.  DIABETIC HISTORY:  William [name] was diagnosed with DM since 1990's, and started insulin therapy approximately 15 years ago. He has been tried on unknown oral glycemic agents in the past but until his September, 2009 visit at our clinic he was on premix insulin.  His hemoglobin A1c has ranged from 6.6% in 02/2018, peaking at 9.5% in 2013.   SUBJECTIVE:   During the last visit (04/27/2018): HIs A1c was 7.0% . His Novolin 70/30 was discontinued and switched to basal/bolus regimen . He was having hypoglycemia during the days of dialysis. He was also started on Rosuvastatin for dyslipidemia.   Today (05/25/2018): William [name]  He checks his blood sugars 2-3 times daily, before meals. The patient has had 1 hypoglycemic episodes since the last clinic visit, which typically occur 2-3 x / week - most often occuring during the days of dialysis . The patient is symptomatic with these episodes, with symptoms of fatigue, blurry vision and palpitations. Patient drinks a glass of juice during these episodes.Otherwise, the patient has not required any recent emergency interventions for hypoglycemia and has not had recent hospitalizations secondary to hyper or hypoglycemic episodes.    ROS: As per HPI and as detailed below: Review of Systems  Constitutional: Positive for malaise/fatigue.  HENT: Negative for  congestion and sore throat.   Eyes: Positive for blurred vision.       Legally blind on the right   Respiratory: Negative for cough and shortness of breath.   Cardiovascular: Negative for chest pain and palpitations.  Gastrointestinal: Negative for constipation and diarrhea.  Musculoskeletal: Negative for falls.  Skin: Negative for rash.  Neurological: Negative for tingling and sensory change.  Endo/Heme/Allergies: Negative.       HOME DIABETES REGIMEN:  Toujeo 14 units QHS - 10 units othe night prior to dialysis Humalog 6 units TID with meals    Statin: Yes ACE-I/ARB: No    METER DOWNLOAD SUMMARY: Date range evaluated: 9/21-10/21/19 Fingerstick Blood Glucose Tests = 53 Overall Mean FS Glucose = 277 Standard Deviation = 127  BG Ranges: Low = 61 High = 600   Hypoglycemic Events/30 Days: BG < 50 = 0 Episodes of symptomatic severe hypoglycemia = 0    DIABETIC COMPLICATIONS: Microvascular complications:   Neuropathy, ESRD on HD and retinopathy  Last Eye Exam: Completed 02/2018  Macrovascular complications:    Denies: CAD, CVA, PVD   HISTORY:  Past Medical History:  Past Medical History:  Diagnosis Date  . Anemia   . CHF (congestive heart failure) ([place])   . Cirrhosis ([place])   . Cirrhosis ([place])   . CKD (chronic kidney disease), stage IV ([place])   . Goodpasture's disease [facility])    on outpatient plasmapheresis/notes 11/20/2017  . Grade I diastolic dysfunction 71/24/5809  . History of anemia due to chronic kidney disease   . History of blood transfusion X 1   "UGIB; low blood count"  . History of plasmapheresis    "qod" (11/20/2017)  . Hypertension   .  Pancytopenia ([place]) 08/20/2017  . Type II diabetes mellitus ([place])    Past Surgical History:  Past Surgical History:  Procedure Laterality Date  . AV FISTULA PLACEMENT Left 12/26/2017   Procedure: LEFT BRACHIOCEPHALIC ARTERIOVENOUS (AV) FISTULA CREATION;  Surgeon: Conrad Bergoo, MD;  Location: [place];  Service:  Vascular;  Laterality: Left;  . BASCILIC VEIN TRANSPOSITION Left 02/16/2018   Procedure: BRACHIOBASILIC VEIN TRANSPOSITION SECOND STAGE;  Surgeon: Conrad Kermit, MD;  Location: [place];  Service: Vascular;  Laterality: Left;  . CATARACT EXTRACTION W/ INTRAOCULAR LENS  IMPLANT, BILATERAL Bilateral   . ESOPHAGEAL BANDING N/A 04/01/2018   Procedure: ESOPHAGEAL BANDING;  Surgeon: Otis Brace, MD;  Location: [facility];  Service: Gastroenterology;  Laterality: N/A;  . ESOPHAGOGASTRODUODENOSCOPY (EGD) WITH PROPOFOL N/A 04/01/2018   Procedure: ESOPHAGOGASTRODUODENOSCOPY (EGD) WITH PROPOFOL;  Surgeon: Otis Brace, MD;  Location: [facility];  Service: Gastroenterology;  Laterality: N/A;  . I&D EXTREMITY Left 02/18/2018   Procedure: IRRIGATION AND DEBRIDEMENT LEFT ARM;  Surgeon: Serafina Mitchell, MD;  Location: [place];  Service: Vascular;  Laterality: Left;  . INSERTION OF DIALYSIS CATHETER N/A 01/19/2018   Procedure: INSERTION OF DIALYSIS CATHETER - RIGHT INTERNAL JUGULAR PLACEMENT;  Surgeon: Rosetta Posner, MD;  Location: [place];  Service: Vascular;  Laterality: N/A;  . IR FLUORO GUIDE CV LINE RIGHT  11/10/2017  . IR PARACENTESIS  12/10/2017  . IR PARACENTESIS  12/26/2017  . IR REMOVAL TUN CV CATH W/O FL  11/28/2017  . IR US GUIDE VASC ACCESS RIGHT  11/10/2017  . NECK SURGERY  2007   "back of neck; no hardware in there"    Social History:  reports that he has never smoked. He has never used smokeless tobacco. He reports that he does not drink alcohol or use drugs. Family History:  Family History  Problem Relation Age of Onset  . Diabetes Mellitus II Sister   . Diabetes Sister   . Stroke Brother   . Heart attack Brother      HOME MEDICATIONS: Allergies as of 05/25/2018      Reactions   Heparin Other (See Comments)   Patient is Muslim and is not permitted Pork derative due to religious belief)   Pork-derived Products Other (See Comments)   Patient does not eat pork due to religious beliefs        Medication List        Accurate as of 05/25/18 10:10 AM. Always use your most recent med list.          acetaminophen 650 MG CR tablet Commonly known as:  TYLENOL Take 650 mg by mouth every 8 (eight) hours as needed for pain.   amLODipine 10 MG tablet Commonly known as:  NORVASC Take 1 tablet (10 mg total) by mouth daily.   calcium acetate 667 MG capsule Commonly known as:  PHOSLO Take 2 capsules (1,334 mg total) by mouth 3 (three) times daily with meals.   carvedilol 12.5 MG tablet Commonly known as:  COREG Take 12.5 mg by mouth 2 (two) times daily with a meal.   TOUJEO MAX SOLOSTAR 300 UNIT/ML Sopn Generic drug:  Insulin Glargine Inject into the skin.   Insulin Glargine 300 UNIT/ML Sopn Inject 14 Units into the skin daily.   insulin lispro 100 UNIT/ML KiwkPen Commonly known as:  HUMALOG 6 to 8 units before each meal   Insulin Pen Needle 31G X 5 MM Misc Use four times daily to inject insulin   IRON 27 240 (  27 FE) MG tablet Generic drug:  ferrous gluconate Take 240 mg by mouth daily.   multivitamin with minerals tablet Take 1 tablet by mouth daily.   ondansetron 4 MG tablet Commonly known as:  ZOFRAN Take 1 tablet (4 mg total) by mouth every 8 (eight) hours as needed for nausea or vomiting.   ONE TOUCH LANCETS Misc Use to test blood sugar three times daily   ONETOUCH VERIO test strip Generic drug:  glucose blood 1 each by Other route as needed for other. Use as instructed   glucose blood test strip Use to check blood sugar 3 times a day dx e11.65   rosuvastatin 5 MG tablet Commonly known as:  CRESTOR Take 1 tablet (5 mg total) by mouth daily.   SYSTANE 0.4-0.3 % Soln Generic drug:  Polyethyl Glycol-Propyl Glycol Place 1 drop into both eyes 3 (three) times daily as needed (for dry eyes).        OBJECTIVE:   Vital Signs: BP 130/68 (BP Location: Right Arm, Patient Position: Sitting, Cuff Size: Normal)   Pulse 73   Ht 5' 4"  (1.626 m)    Wt 62 kg   SpO2 95%   BMI 23.45 kg/m    Exam: General: Pt appears well and is in NAD  Hydration: Well-hydrated with moist mucous membranes and good skin turgor  HEENT: Head: Unremarkable with good dentition. Oropharynx clear without exudate.  Eyes: External eye exam normal without stare, lid lag or exophthalmos.  EOM intact.  PERRL.  Neck: General: Supple without adenopathy. Thyroid: Thyroid size normal.  No goiter or nodules appreciated. No thyroid bruit.  Lungs: Clear with good BS bilat with no rales, rhonchi, or wheezes  Heart: RRR with normal S1 and S2 and no gallops; no murmurs; no rub  Abdomen: Normoactive bowel sounds, soft, nontender, without masses or organomegaly palpable  Extremities: Trace pretibial edema. No tremor. Normal strength and motion throughout. See detailed diabetic foot exam below.  Skin: Normal texture and temperature to palpation. No rash noted. No Acanthosis nigricans/skin tags. No lipohypertrophy.  Neuro: MS is good with appropriate affect, pt is alert and Ox3    DM foot exam: 04/23/2018           DATA REVIEWED: A1c: Results for ZEESHAN, KORTE (MRN [id number]) as of 05/25/2018 10:11  Ref. Range 03/09/2018 12:29  Hemoglobin A1C Latest Ref Range: 4.8 - 5.6 % 7.0 (H)    ASSESSMENT / PLAN / RECOMMENDATIONS:   1) Type2 Diabetes Mellitus, poorly controlled, With microvascular complications - Most recent A1c of 7.0% %. Goal A1c < 7.5%.    Plan:  - His seemingly " good" A1c may have been due to recurrent hypoglycemia. This seems to be getting better with new insulin regimen.  - Discussed insulin pharmacokinetics of long/short acting insulin.  - He was advised to take Humalog before of with meals and not AFTER - Continue to check glucose before meals and at bedtime - His daughter-in law checks his finger sticks - There's a lot of variability in his  Glucose readings which is due to insulin- carb mismatched or eating with no prandial coverage. Overall his  glucose readings are highest at the end of the day (> 250 mg /dl) his fasting glucose are also variable ranging from 70's to 200's   -  MEDICATIONS:   Change Toujeo to 12 units QHS daily   Humalog Continue 6 units with Breakfast, Increase to 8 units with Lunch and Supper  EDUCATION / INSTRUCTIONS:  BG  monitoring instructions: Patient is instructed to check his blood sugars 4times a day, Before meals and bedtime.  Call [place] [facility] if: BG persistently < 70 or > 300. . I reviewed the Rule of 15 for the treatment of hypoglycemia in detail with the patient. Literature supplied.     2) Diabetic complications:   Eye: Does  have known diabetic retinopathy. Last eye exam was less than 1 year ago.   Neuro/ Feet: Does have known diabetic peripheral neuropathy (based on exam).   Renal: Patient does have known baseline CKD.He is on HD  3) Lipids: Patient is on a statin.  4) Hypertension: He is at goal of < 140/90 mmHg.    F/U in 2 months    Signed electronically by: Mack Guise, MD  [facility]  [facility] [address] [place], [place] [address] Phone: [phone] FAX: [phone]   CC: Horald Pollen, MD [place] [address] Phone: [phone]  Fax: [phone]  Return to Endocrinology clinic as below: Future Appointments  Date Time Provider [place]  07/09/2018 10:30 AM [facility] None  07/15/2018  2:00 PM Elayne Snare, MD [facility] None

## 2018-05-25 NOTE — Patient Instructions (Addendum)
-   Toujeo 12 units at Bedtime - Humalog Take 6 units with breakfast                            8 Units with Lunch and Supper - If you are going to eat half of your meal, take 50 % of your Humalog   - Check your sugar Before meals and at Bedtime   - HOW TO TREAT LOW BLOOD SUGARS (Blood sugar LESS THAN 70 MG/DL)  Please follow the RULE OF 15 for the treatment of hypoglycemia treatment (when your (blood sugars are less than 70 mg/dL)    STEP 1: Take 15 grams of carbohydrates when your blood sugar is low, which includes:   3-4 GLUCOSE TABS  OR  3-4 OZ OF JUICE OR REGULAR SODA OR  ONE TUBE OF GLUCOSE GEL     STEP 2: RECHECK blood sugar in 15 MINUTES STEP 3: If your blood sugar is still low at the 15 minute recheck --> then, go back to STEP 1 and treat AGAIN with another 15 grams of carbohydrates.

## 2018-05-26 DIAGNOSIS — D509 Iron deficiency anemia, unspecified: Secondary | ICD-10-CM | POA: Diagnosis not present

## 2018-05-26 DIAGNOSIS — N186 End stage renal disease: Secondary | ICD-10-CM | POA: Diagnosis not present

## 2018-05-26 DIAGNOSIS — D631 Anemia in chronic kidney disease: Secondary | ICD-10-CM | POA: Diagnosis not present

## 2018-05-26 DIAGNOSIS — N2581 Secondary hyperparathyroidism of renal origin: Secondary | ICD-10-CM | POA: Diagnosis not present

## 2018-05-28 DIAGNOSIS — D509 Iron deficiency anemia, unspecified: Secondary | ICD-10-CM | POA: Diagnosis not present

## 2018-05-28 DIAGNOSIS — N186 End stage renal disease: Secondary | ICD-10-CM | POA: Diagnosis not present

## 2018-05-28 DIAGNOSIS — D631 Anemia in chronic kidney disease: Secondary | ICD-10-CM | POA: Diagnosis not present

## 2018-05-28 DIAGNOSIS — N2581 Secondary hyperparathyroidism of renal origin: Secondary | ICD-10-CM | POA: Diagnosis not present

## 2018-05-30 DIAGNOSIS — N186 End stage renal disease: Secondary | ICD-10-CM | POA: Diagnosis not present

## 2018-05-30 DIAGNOSIS — D631 Anemia in chronic kidney disease: Secondary | ICD-10-CM | POA: Diagnosis not present

## 2018-05-30 DIAGNOSIS — N2581 Secondary hyperparathyroidism of renal origin: Secondary | ICD-10-CM | POA: Diagnosis not present

## 2018-05-30 DIAGNOSIS — D509 Iron deficiency anemia, unspecified: Secondary | ICD-10-CM | POA: Diagnosis not present

## 2018-06-01 NOTE — Telephone Encounter (Signed)
Error

## 2018-06-02 DIAGNOSIS — N2581 Secondary hyperparathyroidism of renal origin: Secondary | ICD-10-CM | POA: Diagnosis not present

## 2018-06-02 DIAGNOSIS — D631 Anemia in chronic kidney disease: Secondary | ICD-10-CM | POA: Diagnosis not present

## 2018-06-02 DIAGNOSIS — D509 Iron deficiency anemia, unspecified: Secondary | ICD-10-CM | POA: Diagnosis not present

## 2018-06-02 DIAGNOSIS — N186 End stage renal disease: Secondary | ICD-10-CM | POA: Diagnosis not present

## 2018-06-04 DIAGNOSIS — N186 End stage renal disease: Secondary | ICD-10-CM | POA: Diagnosis not present

## 2018-06-04 DIAGNOSIS — D509 Iron deficiency anemia, unspecified: Secondary | ICD-10-CM | POA: Diagnosis not present

## 2018-06-04 DIAGNOSIS — D631 Anemia in chronic kidney disease: Secondary | ICD-10-CM | POA: Diagnosis not present

## 2018-06-04 DIAGNOSIS — N2581 Secondary hyperparathyroidism of renal origin: Secondary | ICD-10-CM | POA: Diagnosis not present

## 2018-06-05 ENCOUNTER — Other Ambulatory Visit: Payer: Self-pay | Admitting: Gastroenterology

## 2018-06-05 DIAGNOSIS — Z992 Dependence on renal dialysis: Secondary | ICD-10-CM | POA: Diagnosis not present

## 2018-06-05 DIAGNOSIS — N186 End stage renal disease: Secondary | ICD-10-CM | POA: Diagnosis not present

## 2018-06-05 DIAGNOSIS — E1122 Type 2 diabetes mellitus with diabetic chronic kidney disease: Secondary | ICD-10-CM | POA: Diagnosis not present

## 2018-06-06 DIAGNOSIS — N2581 Secondary hyperparathyroidism of renal origin: Secondary | ICD-10-CM | POA: Diagnosis not present

## 2018-06-06 DIAGNOSIS — N186 End stage renal disease: Secondary | ICD-10-CM | POA: Diagnosis not present

## 2018-06-06 DIAGNOSIS — D509 Iron deficiency anemia, unspecified: Secondary | ICD-10-CM | POA: Diagnosis not present

## 2018-06-06 DIAGNOSIS — D631 Anemia in chronic kidney disease: Secondary | ICD-10-CM | POA: Diagnosis not present

## 2018-06-09 DIAGNOSIS — D509 Iron deficiency anemia, unspecified: Secondary | ICD-10-CM | POA: Diagnosis not present

## 2018-06-09 DIAGNOSIS — D631 Anemia in chronic kidney disease: Secondary | ICD-10-CM | POA: Diagnosis not present

## 2018-06-09 DIAGNOSIS — N2581 Secondary hyperparathyroidism of renal origin: Secondary | ICD-10-CM | POA: Diagnosis not present

## 2018-06-09 DIAGNOSIS — N186 End stage renal disease: Secondary | ICD-10-CM | POA: Diagnosis not present

## 2018-06-11 DIAGNOSIS — D509 Iron deficiency anemia, unspecified: Secondary | ICD-10-CM | POA: Diagnosis not present

## 2018-06-11 DIAGNOSIS — D631 Anemia in chronic kidney disease: Secondary | ICD-10-CM | POA: Diagnosis not present

## 2018-06-11 DIAGNOSIS — N186 End stage renal disease: Secondary | ICD-10-CM | POA: Diagnosis not present

## 2018-06-11 DIAGNOSIS — N2581 Secondary hyperparathyroidism of renal origin: Secondary | ICD-10-CM | POA: Diagnosis not present

## 2018-06-13 DIAGNOSIS — D509 Iron deficiency anemia, unspecified: Secondary | ICD-10-CM | POA: Diagnosis not present

## 2018-06-13 DIAGNOSIS — D631 Anemia in chronic kidney disease: Secondary | ICD-10-CM | POA: Diagnosis not present

## 2018-06-13 DIAGNOSIS — N186 End stage renal disease: Secondary | ICD-10-CM | POA: Diagnosis not present

## 2018-06-13 DIAGNOSIS — N2581 Secondary hyperparathyroidism of renal origin: Secondary | ICD-10-CM | POA: Diagnosis not present

## 2018-06-16 DIAGNOSIS — N2581 Secondary hyperparathyroidism of renal origin: Secondary | ICD-10-CM | POA: Diagnosis not present

## 2018-06-16 DIAGNOSIS — N186 End stage renal disease: Secondary | ICD-10-CM | POA: Diagnosis not present

## 2018-06-16 DIAGNOSIS — D509 Iron deficiency anemia, unspecified: Secondary | ICD-10-CM | POA: Diagnosis not present

## 2018-06-16 DIAGNOSIS — D631 Anemia in chronic kidney disease: Secondary | ICD-10-CM | POA: Diagnosis not present

## 2018-06-18 DIAGNOSIS — D631 Anemia in chronic kidney disease: Secondary | ICD-10-CM | POA: Diagnosis not present

## 2018-06-18 DIAGNOSIS — N2581 Secondary hyperparathyroidism of renal origin: Secondary | ICD-10-CM | POA: Diagnosis not present

## 2018-06-18 DIAGNOSIS — N186 End stage renal disease: Secondary | ICD-10-CM | POA: Diagnosis not present

## 2018-06-18 DIAGNOSIS — D509 Iron deficiency anemia, unspecified: Secondary | ICD-10-CM | POA: Diagnosis not present

## 2018-06-20 DIAGNOSIS — N186 End stage renal disease: Secondary | ICD-10-CM | POA: Diagnosis not present

## 2018-06-20 DIAGNOSIS — D509 Iron deficiency anemia, unspecified: Secondary | ICD-10-CM | POA: Diagnosis not present

## 2018-06-20 DIAGNOSIS — D631 Anemia in chronic kidney disease: Secondary | ICD-10-CM | POA: Diagnosis not present

## 2018-06-20 DIAGNOSIS — N2581 Secondary hyperparathyroidism of renal origin: Secondary | ICD-10-CM | POA: Diagnosis not present

## 2018-06-23 DIAGNOSIS — D631 Anemia in chronic kidney disease: Secondary | ICD-10-CM | POA: Diagnosis not present

## 2018-06-23 DIAGNOSIS — N186 End stage renal disease: Secondary | ICD-10-CM | POA: Diagnosis not present

## 2018-06-23 DIAGNOSIS — N2581 Secondary hyperparathyroidism of renal origin: Secondary | ICD-10-CM | POA: Diagnosis not present

## 2018-06-23 DIAGNOSIS — D509 Iron deficiency anemia, unspecified: Secondary | ICD-10-CM | POA: Diagnosis not present

## 2018-06-25 DIAGNOSIS — N2581 Secondary hyperparathyroidism of renal origin: Secondary | ICD-10-CM | POA: Diagnosis not present

## 2018-06-25 DIAGNOSIS — N186 End stage renal disease: Secondary | ICD-10-CM | POA: Diagnosis not present

## 2018-06-25 DIAGNOSIS — D631 Anemia in chronic kidney disease: Secondary | ICD-10-CM | POA: Diagnosis not present

## 2018-06-25 DIAGNOSIS — D509 Iron deficiency anemia, unspecified: Secondary | ICD-10-CM | POA: Diagnosis not present

## 2018-06-27 DIAGNOSIS — N2581 Secondary hyperparathyroidism of renal origin: Secondary | ICD-10-CM | POA: Diagnosis not present

## 2018-06-27 DIAGNOSIS — D631 Anemia in chronic kidney disease: Secondary | ICD-10-CM | POA: Diagnosis not present

## 2018-06-27 DIAGNOSIS — N186 End stage renal disease: Secondary | ICD-10-CM | POA: Diagnosis not present

## 2018-06-27 DIAGNOSIS — D509 Iron deficiency anemia, unspecified: Secondary | ICD-10-CM | POA: Diagnosis not present

## 2018-06-29 ENCOUNTER — Ambulatory Visit (HOSPITAL_COMMUNITY)
Admission: RE | Admit: 2018-06-29 | Discharge: 2018-06-29 | Disposition: A | Discharge status: Home / Self Care | Payer: Medicare Other | Source: Ambulatory Visit | Attending: Gastroenterology | Admitting: Gastroenterology

## 2018-06-29 ENCOUNTER — Encounter (HOSPITAL_COMMUNITY)
Admission: RE | Disposition: A | Discharge status: Home / Self Care | Payer: Self-pay | Source: Ambulatory Visit | Attending: Gastroenterology

## 2018-06-29 ENCOUNTER — Encounter (HOSPITAL_COMMUNITY): Payer: Self-pay | Admitting: *Deleted

## 2018-06-29 ENCOUNTER — Ambulatory Visit (HOSPITAL_COMMUNITY): Payer: Medicare Other | Admitting: Anesthesiology

## 2018-06-29 ENCOUNTER — Other Ambulatory Visit: Payer: Self-pay

## 2018-06-29 DIAGNOSIS — I8500 Esophageal varices without bleeding: Secondary | ICD-10-CM | POA: Insufficient documentation

## 2018-06-29 DIAGNOSIS — I5033 Acute on chronic diastolic (congestive) heart failure: Secondary | ICD-10-CM | POA: Diagnosis not present

## 2018-06-29 DIAGNOSIS — I509 Heart failure, unspecified: Secondary | ICD-10-CM | POA: Diagnosis not present

## 2018-06-29 DIAGNOSIS — K7469 Other cirrhosis of liver: Secondary | ICD-10-CM | POA: Insufficient documentation

## 2018-06-29 DIAGNOSIS — Z79899 Other long term (current) drug therapy: Secondary | ICD-10-CM | POA: Diagnosis not present

## 2018-06-29 DIAGNOSIS — I132 Hypertensive heart and chronic kidney disease with heart failure and with stage 5 chronic kidney disease, or end stage renal disease: Secondary | ICD-10-CM | POA: Diagnosis not present

## 2018-06-29 DIAGNOSIS — I864 Gastric varices: Secondary | ICD-10-CM | POA: Insufficient documentation

## 2018-06-29 DIAGNOSIS — N186 End stage renal disease: Secondary | ICD-10-CM | POA: Diagnosis not present

## 2018-06-29 DIAGNOSIS — E1122 Type 2 diabetes mellitus with diabetic chronic kidney disease: Secondary | ICD-10-CM | POA: Diagnosis not present

## 2018-06-29 DIAGNOSIS — Z794 Long term (current) use of insulin: Secondary | ICD-10-CM | POA: Diagnosis not present

## 2018-06-29 DIAGNOSIS — D631 Anemia in chronic kidney disease: Secondary | ICD-10-CM | POA: Diagnosis not present

## 2018-06-29 DIAGNOSIS — Z992 Dependence on renal dialysis: Secondary | ICD-10-CM | POA: Diagnosis not present

## 2018-06-29 HISTORY — PX: ESOPHAGOGASTRODUODENOSCOPY (EGD) WITH PROPOFOL: SHX5813

## 2018-06-29 HISTORY — PX: ESOPHAGEAL BANDING: SHX5518

## 2018-06-29 LAB — POCT I-STAT 4, (NA,K, GLUC, HGB,HCT)
GLUCOSE: 197 mg/dL — AB (ref 70–99)
HCT: 38 % — ABNORMAL LOW (ref 39.0–52.0)
HEMOGLOBIN: 12.9 g/dL — AB (ref 13.0–17.0)
Potassium: 4.1 mmol/L (ref 3.5–5.1)
Sodium: 134 mmol/L — ABNORMAL LOW (ref 135–145)

## 2018-06-29 SURGERY — ESOPHAGOGASTRODUODENOSCOPY (EGD) WITH PROPOFOL
Anesthesia: Monitor Anesthesia Care

## 2018-06-29 MED ORDER — PROPOFOL 10 MG/ML IV BOLUS
INTRAVENOUS | Status: DC | PRN
  Administered 2018-06-29: 40 mg via INTRAVENOUS

## 2018-06-29 MED ORDER — SODIUM CHLORIDE 0.9 % IV SOLN: INTRAVENOUS | Status: DC

## 2018-06-29 MED ORDER — PROPOFOL 10 MG/ML IV BOLUS
INTRAVENOUS | Status: AC
  Filled 2018-06-29: qty 40

## 2018-06-29 MED ORDER — PROPOFOL 500 MG/50ML IV EMUL
INTRAVENOUS | Status: DC | PRN
  Administered 2018-06-29: 100 ug/kg/min via INTRAVENOUS

## 2018-06-29 MED ORDER — SODIUM CHLORIDE 0.9 % IV SOLN
INTRAVENOUS | Status: DC
  Administered 2018-06-29: 08:00:00 via INTRAVENOUS

## 2018-06-29 SURGICAL SUPPLY — 15 items

## 2018-06-29 NOTE — Anesthesia Postprocedure Evaluation (Signed)
Anesthesia Post Note  Patient: William [name]  Procedure(s) Performed: ESOPHAGOGASTRODUODENOSCOPY (EGD) WITH PROPOFOL (N/A ) ESOPHAGEAL BANDING (N/A )     Patient location during evaluation: Endoscopy Anesthesia Type: MAC Level of consciousness: awake and alert Pain management: pain level controlled Vital Signs Assessment: post-procedure vital signs reviewed and stable Respiratory status: spontaneous breathing, nonlabored ventilation, respiratory function stable and patient connected to nasal cannula oxygen Cardiovascular status: stable and blood pressure returned to baseline Postop Assessment: no apparent nausea or vomiting Anesthetic complications: no    Last Vitals:  Vitals:   06/29/18 0830 06/29/18 0840  BP: (!) 115/47 (!) 129/52  Pulse: 65 65  Resp: 20 16  Temp:    SpO2: 100% 96%    Last Pain:  Vitals:   06/29/18 0824  TempSrc: Oral  PainSc:                  Jeffie Spivack COKER

## 2018-06-29 NOTE — Discharge Instructions (Signed)

## 2018-06-29 NOTE — Op Note (Signed)
[facility] Patient Name: William [name] Procedure Date: 06/29/2018 MRN: [id number] Attending MD: Otis Brace , MD Date of Birth: 1951-01-28 CSN: [id number] Age: 67 Admit Type: Outpatient Procedure:                Upper GI endoscopy Indications:              For therapy of esophageal varices Providers:                Otis Brace, MD, Carlyn Reichert, RN, Charolette Child, Technician, Anne Fu CRNA, CRNA Referring MD:              Medicines:                Sedation Administered by an Anesthesia Professional Complications:            No immediate complications. Estimated Blood Loss:     Estimated blood loss: none. Procedure:                Pre-Anesthesia Assessment:                           - Prior to the procedure, a History and Physical                            was performed, and patient medications and                            allergies were reviewed. The patient's tolerance of                            previous anesthesia was also reviewed. The risks                            and benefits of the procedure and the sedation                            options and risks were discussed with the patient.                            All questions were answered, and informed consent                            was obtained. Prior Anticoagulants: The patient has                            taken no previous anticoagulant or antiplatelet                            agents. ASA Grade Assessment: III - A patient with                            severe systemic disease. After reviewing the risks  and benefits, the patient was deemed in                            satisfactory condition to undergo the procedure.                           After obtaining informed consent, the endoscope was                            passed under direct vision. Throughout the                            procedure, the patient's blood  pressure, pulse, and                            oxygen saturations were monitored continuously. The                            [id number]-H190 ([id number] Olympus adult endoscope was                            introduced through the mouth, and advanced to the                            second part of duodenum. The upper GI endoscopy was                            technically difficult and complex due to presence                            of food. The patient tolerated the procedure well. Scope In: Scope Out: Findings:      Large (> 5 mm) varices were found in the distal esophagus. Three bands       were successfully placed with complete eradication, resulting in       deflation of varices. There was no bleeding during and at the end of the       procedure.      The Z-line was regular and was found 36 cm from the incisors.      A large amount of food (residue) was found in the gastric fundus and in       the gastric body.      Type 2 gastroesophageal varices (GOV2, esophageal varices which extend       along the fundus) with no bleeding were found in the cardia. There were       no stigmata of recent bleeding.      The duodenal bulb, first portion of the duodenum and second portion of       the duodenum were normal. Impression:               - Large (> 5 mm) esophageal varices. Completely                            eradicated. Banded.                           -  Z-line regular, 36 cm from the incisors.                           - A large amount of food (residue) in the stomach.                           - Type 2 gastroesophageal varices (GOV2, esophageal                            varices which extend along the fundus), without                            bleeding.                           - Normal duodenal bulb, first portion of the                            duodenum and second portion of the duodenum.                           - No specimens collected. Moderate Sedation:      Moderate  (conscious) sedation was personally administered by an       anesthesia professional. The following parameters were monitored: oxygen       saturation, heart rate, blood pressure, and response to care. Recommendation:           - Patient has a contact number available for                            emergencies. The signs and symptoms of potential                            delayed complications were discussed with the                            patient. Return to normal activities tomorrow.                            Written discharge instructions were provided to the                            patient.                           - Resume previous diet.                           - Continue present medications.                           - Repeat upper endoscopy in 2 months for                            retreatment.                           -  Return to my office as previously scheduled. Procedure Code(s):        --- Professional ---                           [id number], Esophagogastroduodenoscopy, flexible,                            transoral; with band ligation of esophageal/gastric                            varices Diagnosis Code(s):        --- Professional ---                           I85.00, Esophageal varices without bleeding                           I86.4, Gastric varices CPT copyright 2018 American Medical Association. All rights reserved. The codes documented in this report are preliminary and upon coder review may  be revised to meet current compliance requirements. Otis Brace, MD Otis Brace, MD 06/29/2018 8:25:00 AM Number of Addenda: 0

## 2018-06-29 NOTE — Anesthesia Preprocedure Evaluation (Addendum)
Anesthesia Evaluation  Patient identified by MRN, date of birth, ID band Patient awake    Reviewed: Allergy & Precautions, NPO status , Patient's Chart, lab work & pertinent test results  Airway Mallampati: II  TM Distance: >3 FB Neck ROM: Full    Dental  (+) Teeth Intact,    Pulmonary    breath sounds clear to auscultation       Cardiovascular hypertension,  Rhythm:Regular     Neuro/Psych    GI/Hepatic   Endo/Other  diabetes  Renal/GU      Musculoskeletal   Abdominal   Peds  Hematology   Anesthesia Other Findings   Reproductive/Obstetrics                            Anesthesia Physical Anesthesia Plan  ASA: III  Anesthesia Plan: MAC   Post-op Pain Management:    Induction:   PONV Risk Score and Plan: Propofol infusion and Ondansetron  Airway Management Planned: Natural Airway and Nasal Cannula  Additional Equipment:   Intra-op Plan:   Post-operative Plan:   Informed Consent: I have reviewed the patients History and Physical, chart, labs and discussed the procedure including the risks, benefits and alternatives for the proposed anesthesia with the patient or authorized representative who has indicated his/her understanding and acceptance.     Plan Discussed with: CRNA and Anesthesiologist  Anesthesia Plan Comments:         Anesthesia Quick Evaluation

## 2018-06-29 NOTE — H&P (Signed)
Primary Care Physician:  Horald Pollen, MD Primary Gastroenterologist:  Dr. Alessandra Bevels  Reason for Visit : EGD for variceal band ligation.  HPI: William [name] is a 67 y.o. male with past medical history of end-stage renal disease currently on hemodialysis, history of cryptogenic cirrhosis presented today for EGD for band ligation of esophageal varices.  Underwent EGD on March 24, 2018 and was found to have a large esophageal as well as type II GOV, 2 bands were placed. Denies any GI symptoms today. Denies abdominal pain, nausea vomiting. He denies blood in the stool or black stool.   Past Medical History:  Diagnosis Date  . Anemia   . CHF (congestive heart failure) ([place])   . Cirrhosis ([place])   . Cirrhosis ([place])   . CKD (chronic kidney disease), stage IV ([place])   . Goodpasture's disease [facility])    on outpatient plasmapheresis/notes 11/20/2017  . Grade I diastolic dysfunction 67/07/4579  . History of anemia due to chronic kidney disease   . History of blood transfusion X 1   "UGIB; low blood count"  . History of plasmapheresis    "qod" (11/20/2017)  . Hypertension   . Pancytopenia ([place]) 08/20/2017  . Type II diabetes mellitus ([place])     Past Surgical History:  Procedure Laterality Date  . AV FISTULA PLACEMENT Left 12/26/2017   Procedure: LEFT BRACHIOCEPHALIC ARTERIOVENOUS (AV) FISTULA CREATION;  Surgeon: Conrad Palmer, MD;  Location: [place];  Service: Vascular;  Laterality: Left;  . BASCILIC VEIN TRANSPOSITION Left 02/16/2018   Procedure: BRACHIOBASILIC VEIN TRANSPOSITION SECOND STAGE;  Surgeon: Conrad , MD;  Location: [place];  Service: Vascular;  Laterality: Left;  . CATARACT EXTRACTION W/ INTRAOCULAR LENS  IMPLANT, BILATERAL Bilateral   . ESOPHAGEAL BANDING N/A 04/01/2018   Procedure: ESOPHAGEAL BANDING;  Surgeon: Otis Brace, MD;  Location: [facility];  Service: Gastroenterology;  Laterality: N/A;  . ESOPHAGOGASTRODUODENOSCOPY (EGD) WITH PROPOFOL N/A 04/01/2018   Procedure: ESOPHAGOGASTRODUODENOSCOPY (EGD) WITH PROPOFOL;  Surgeon: Otis Brace, MD;  Location: [facility];  Service: Gastroenterology;  Laterality: N/A;  . I&D EXTREMITY Left 02/18/2018   Procedure: IRRIGATION AND DEBRIDEMENT LEFT ARM;  Surgeon: Serafina Mitchell, MD;  Location: [place];  Service: Vascular;  Laterality: Left;  . INSERTION OF DIALYSIS CATHETER N/A 01/19/2018   Procedure: INSERTION OF DIALYSIS CATHETER - RIGHT INTERNAL JUGULAR PLACEMENT;  Surgeon: Rosetta Posner, MD;  Location: [place];  Service: Vascular;  Laterality: N/A;  . IR FLUORO GUIDE CV LINE RIGHT  11/10/2017  . IR PARACENTESIS  12/10/2017  . IR PARACENTESIS  12/26/2017  . IR REMOVAL TUN CV CATH W/O FL  11/28/2017  . IR US GUIDE VASC ACCESS RIGHT  11/10/2017  . NECK SURGERY  2007   "back of neck; no hardware in there"    Prior to Admission medications   Medication Sig Start Date End Date Taking? Authorizing Provider  acetaminophen (TYLENOL) 650 MG CR tablet Take 650 mg by mouth every 8 (eight) hours as needed for pain.   Yes [provider]  amLODipine (NORVASC) 10 MG tablet Take 1 tablet (10 mg total) daily by mouth. 06/20/17 11/06/24 Yes Gildardo Pounds, NP  carvedilol (COREG) 12.5 MG tablet Take 12.5 mg by mouth 2 (two) times daily with a meal.   Yes [provider]  ferrous gluconate (IRON 27) 240 (27 FE) MG tablet Take 240 mg by mouth daily.    Yes [provider]  insulin NPH-regular Human (NOVOLIN 70/30) (70-30) 100 UNIT/ML  injection Inject 18 Units into the skin 2 (two) times daily with a meal. 11/18/17  Yes Regalado, Belkys A, MD  Multiple Vitamins-Minerals (MULTIVITAMIN WITH MINERALS) tablet Take 1 tablet by mouth daily.   Yes [provider]  Polyethyl Glycol-Propyl Glycol (SYSTANE) 0.4-0.3 % SOLN Place 1 drop into both eyes 3 (three) times daily as needed (for dry eyes).   Yes [provider]  calcium acetate (PHOSLO) 667 MG capsule Take 2 capsules (1,334 mg total) by  mouth 3 (three) times daily with meals. Patient not taking: Reported on 03/30/2018 01/26/18   Purohit, Konrad Dolores, MD  lactulose (CHRONULAC) 10 GM/15ML solution Take 30 mLs (20 g total) by mouth 2 (two) times daily. Patient not taking: Reported on 03/09/2018 01/26/18   Cristy Folks, MD    Scheduled Meds: Continuous Infusions: . sodium chloride    . sodium chloride     PRN Meds:.  Allergies as of 06/05/2018 - Review Complete 05/25/2018  Allergen Reaction Noted  . Heparin Other (See Comments) 11/11/2017  . Pork-derived products Other (See Comments) 02/18/2018    Family History  Problem Relation Age of Onset  . Diabetes Mellitus II Sister   . Diabetes Sister   . Stroke Brother   . Heart attack Brother     Social History   Socioeconomic History  . Marital status: Married    Spouse name: Not on file  . Number of children: Not on file  . Years of education: Not on file  . Highest education level: Not on file  Occupational History  . Not on file  Social Needs  . Financial resource strain: Not on file  . Food insecurity:    Worry: Not on file    Inability: Not on file  . Transportation needs:    Medical: Not on file    Non-medical: Not on file  Tobacco Use  . Smoking status: Never Smoker  . Smokeless tobacco: Never Used  Substance and Sexual Activity  . Alcohol use: Never    Frequency: Never  . Drug use: Never  . Sexual activity: Not on file  Lifestyle  . Physical activity:    Days per week: Not on file    Minutes per session: Not on file  . Stress: Not on file  Relationships  . Social connections:    Talks on phone: Not on file    Gets together: Not on file    Attends religious service: Not on file    Active member of club or organization: Not on file    Attends meetings of clubs or organizations: Not on file    Relationship status: Not on file  . Intimate partner violence:    Fear of current or ex partner: Not on file    Emotionally abused: Not on file     Physically abused: Not on file    Forced sexual activity: Not on file  Other Topics Concern  . Not on file  Social History Narrative  . Not on file    Review of Systems: All negative except as stated above in HPI.  Physical Exam: Vital signs: Vitals:   06/29/18 0735  BP: (!) 171/74  Pulse: 75  Resp: 14  Temp: 97.8 F (36.6 C)  SpO2: 98%     General:   Alert,  Well-developed, well-nourished, pleasant and cooperative in NAD Lungs:  Clear throughout to auscultation.   No wheezes, crackles, or rhonchi. No acute distress. Heart:  Regular rate and rhythm; no murmurs,  clicks, rubs,  or gallops. Abdomen: soft, nontender, nondistended, bowel sounds present Rectal:  Deferred  GI:  Lab Results: Recent Labs    06/29/18 0747  HGB 12.9*  HCT 38.0*   BMET Recent Labs    06/29/18 0747  NA 134*  K 4.1  GLUCOSE 197*   LFT No results for input(s): PROT, ALBUMIN, AST, ALT, ALKPHOS, BILITOT, BILIDIR, IBILI in the last 72 hours. PT/INR No results for input(s): LABPROT, INR in the last 72 hours.   Studies/Results: No results found.  Impression/Plan: -Esophageal and gastric varices.  unspecified cirrhosis of the liver. - end-stage renal disease on hemodialysis.  Recommendations -------------------------- - Procedure with EGD with possible band ligation.  Risks (bleeding, infection, bowel perforation that could require surgery, sedation-related changes in cardiopulmonary systems), benefits (identification and possible treatment of source of symptoms, exclusion of certain causes of symptoms), and alternatives (watchful waiting, radiographic imaging studies, empiric medical treatment)  were explained to patient and family in detail and patient wishes to proceed.    LOS: 0 days   Otis Brace  MD, FACP 06/29/2018, 7:58 AM  Contact #  [phone]

## 2018-06-29 NOTE — Transfer of Care (Signed)
Immediate Anesthesia Transfer of Care Note  Patient: William [name]  Procedure(s) Performed: Procedure(s): ESOPHAGOGASTRODUODENOSCOPY (EGD) WITH PROPOFOL (N/A) ESOPHAGEAL BANDING (N/A)  Patient Location: PACU  Anesthesia Type:MAC  Level of Consciousness:  sedated, patient cooperative and responds to stimulation  Airway & Oxygen Therapy:Patient Spontanous Breathing and Patient connected to face mask oxgen  Post-op Assessment:  Report given to PACU RN and Post -op Vital signs reviewed and stable  Post vital signs:  Reviewed and stable  Last Vitals:  Vitals:   06/29/18 0735  BP: (!) 171/74  Pulse: 75  Resp: 14  Temp: 36.6 C  SpO2: 62%    Complications: No apparent anesthesia complications

## 2018-06-30 DIAGNOSIS — D509 Iron deficiency anemia, unspecified: Secondary | ICD-10-CM | POA: Diagnosis not present

## 2018-06-30 DIAGNOSIS — D631 Anemia in chronic kidney disease: Secondary | ICD-10-CM | POA: Diagnosis not present

## 2018-06-30 DIAGNOSIS — N186 End stage renal disease: Secondary | ICD-10-CM | POA: Diagnosis not present

## 2018-06-30 DIAGNOSIS — N2581 Secondary hyperparathyroidism of renal origin: Secondary | ICD-10-CM | POA: Diagnosis not present

## 2018-06-30 NOTE — Addendum Note (Signed)
Addendum  created 06/30/18 0618 by Lollie Sails, CRNA   Charge Capture section accepted

## 2018-07-01 ENCOUNTER — Encounter (HOSPITAL_COMMUNITY): Payer: Self-pay | Admitting: Gastroenterology

## 2018-07-01 DIAGNOSIS — D509 Iron deficiency anemia, unspecified: Secondary | ICD-10-CM | POA: Diagnosis not present

## 2018-07-01 DIAGNOSIS — N2581 Secondary hyperparathyroidism of renal origin: Secondary | ICD-10-CM | POA: Diagnosis not present

## 2018-07-01 DIAGNOSIS — D631 Anemia in chronic kidney disease: Secondary | ICD-10-CM | POA: Diagnosis not present

## 2018-07-01 DIAGNOSIS — N186 End stage renal disease: Secondary | ICD-10-CM | POA: Diagnosis not present

## 2018-07-04 DIAGNOSIS — N2581 Secondary hyperparathyroidism of renal origin: Secondary | ICD-10-CM | POA: Diagnosis not present

## 2018-07-04 DIAGNOSIS — N186 End stage renal disease: Secondary | ICD-10-CM | POA: Diagnosis not present

## 2018-07-04 DIAGNOSIS — D631 Anemia in chronic kidney disease: Secondary | ICD-10-CM | POA: Diagnosis not present

## 2018-07-04 DIAGNOSIS — D509 Iron deficiency anemia, unspecified: Secondary | ICD-10-CM | POA: Diagnosis not present

## 2018-07-05 DIAGNOSIS — N186 End stage renal disease: Secondary | ICD-10-CM | POA: Diagnosis not present

## 2018-07-05 DIAGNOSIS — Z992 Dependence on renal dialysis: Secondary | ICD-10-CM | POA: Diagnosis not present

## 2018-07-05 DIAGNOSIS — E1122 Type 2 diabetes mellitus with diabetic chronic kidney disease: Secondary | ICD-10-CM | POA: Diagnosis not present

## 2018-07-07 DIAGNOSIS — D509 Iron deficiency anemia, unspecified: Secondary | ICD-10-CM | POA: Diagnosis not present

## 2018-07-07 DIAGNOSIS — N186 End stage renal disease: Secondary | ICD-10-CM | POA: Diagnosis not present

## 2018-07-07 DIAGNOSIS — N2581 Secondary hyperparathyroidism of renal origin: Secondary | ICD-10-CM | POA: Diagnosis not present

## 2018-07-07 DIAGNOSIS — D631 Anemia in chronic kidney disease: Secondary | ICD-10-CM | POA: Diagnosis not present

## 2018-07-08 ENCOUNTER — Other Ambulatory Visit: Payer: Self-pay | Admitting: Endocrinology

## 2018-07-08 DIAGNOSIS — E1122 Type 2 diabetes mellitus with diabetic chronic kidney disease: Secondary | ICD-10-CM

## 2018-07-08 DIAGNOSIS — Z992 Dependence on renal dialysis: Principal | ICD-10-CM

## 2018-07-08 DIAGNOSIS — Z794 Long term (current) use of insulin: Principal | ICD-10-CM

## 2018-07-08 DIAGNOSIS — N186 End stage renal disease: Principal | ICD-10-CM

## 2018-07-09 ENCOUNTER — Ambulatory Visit: Payer: Medicare Other | Admitting: Endocrinology

## 2018-07-09 ENCOUNTER — Other Ambulatory Visit (INDEPENDENT_AMBULATORY_CARE_PROVIDER_SITE_OTHER): Payer: Medicare Other

## 2018-07-09 DIAGNOSIS — R188 Other ascites: Secondary | ICD-10-CM

## 2018-07-09 DIAGNOSIS — Z794 Long term (current) use of insulin: Secondary | ICD-10-CM | POA: Diagnosis not present

## 2018-07-09 DIAGNOSIS — N186 End stage renal disease: Secondary | ICD-10-CM | POA: Diagnosis not present

## 2018-07-09 DIAGNOSIS — Z992 Dependence on renal dialysis: Secondary | ICD-10-CM

## 2018-07-09 DIAGNOSIS — E1122 Type 2 diabetes mellitus with diabetic chronic kidney disease: Secondary | ICD-10-CM

## 2018-07-09 DIAGNOSIS — N2581 Secondary hyperparathyroidism of renal origin: Secondary | ICD-10-CM | POA: Diagnosis not present

## 2018-07-09 DIAGNOSIS — D631 Anemia in chronic kidney disease: Secondary | ICD-10-CM | POA: Diagnosis not present

## 2018-07-09 DIAGNOSIS — D509 Iron deficiency anemia, unspecified: Secondary | ICD-10-CM | POA: Diagnosis not present

## 2018-07-09 LAB — HEMOGLOBIN A1C: Hgb A1c MFr Bld: 7.7 % — ABNORMAL HIGH (ref 4.6–6.5)

## 2018-07-09 LAB — GLUCOSE, RANDOM: Glucose, Bld: 206 mg/dL — ABNORMAL HIGH (ref 70–99)

## 2018-07-11 DIAGNOSIS — N2581 Secondary hyperparathyroidism of renal origin: Secondary | ICD-10-CM | POA: Diagnosis not present

## 2018-07-11 DIAGNOSIS — N186 End stage renal disease: Secondary | ICD-10-CM | POA: Diagnosis not present

## 2018-07-11 DIAGNOSIS — D631 Anemia in chronic kidney disease: Secondary | ICD-10-CM | POA: Diagnosis not present

## 2018-07-11 DIAGNOSIS — D509 Iron deficiency anemia, unspecified: Secondary | ICD-10-CM | POA: Diagnosis not present

## 2018-07-14 DIAGNOSIS — D509 Iron deficiency anemia, unspecified: Secondary | ICD-10-CM | POA: Diagnosis not present

## 2018-07-14 DIAGNOSIS — D631 Anemia in chronic kidney disease: Secondary | ICD-10-CM | POA: Diagnosis not present

## 2018-07-14 DIAGNOSIS — N186 End stage renal disease: Secondary | ICD-10-CM | POA: Diagnosis not present

## 2018-07-14 DIAGNOSIS — N2581 Secondary hyperparathyroidism of renal origin: Secondary | ICD-10-CM | POA: Diagnosis not present

## 2018-07-15 ENCOUNTER — Ambulatory Visit: Payer: Medicare Other | Admitting: Endocrinology

## 2018-07-16 DIAGNOSIS — D509 Iron deficiency anemia, unspecified: Secondary | ICD-10-CM | POA: Diagnosis not present

## 2018-07-16 DIAGNOSIS — N186 End stage renal disease: Secondary | ICD-10-CM | POA: Diagnosis not present

## 2018-07-16 DIAGNOSIS — N2581 Secondary hyperparathyroidism of renal origin: Secondary | ICD-10-CM | POA: Diagnosis not present

## 2018-07-16 DIAGNOSIS — D631 Anemia in chronic kidney disease: Secondary | ICD-10-CM | POA: Diagnosis not present

## 2018-07-17 ENCOUNTER — Telehealth: Payer: Self-pay

## 2018-07-17 ENCOUNTER — Other Ambulatory Visit: Payer: Self-pay

## 2018-07-17 NOTE — Telephone Encounter (Signed)
Please schedule this pt for an appointment with Dr. Dwyane Dee. He cannot get refills on his medications unless he comes for a visit.

## 2018-07-18 DIAGNOSIS — N186 End stage renal disease: Secondary | ICD-10-CM | POA: Diagnosis not present

## 2018-07-18 DIAGNOSIS — N2581 Secondary hyperparathyroidism of renal origin: Secondary | ICD-10-CM | POA: Diagnosis not present

## 2018-07-18 DIAGNOSIS — D631 Anemia in chronic kidney disease: Secondary | ICD-10-CM | POA: Diagnosis not present

## 2018-07-18 DIAGNOSIS — D509 Iron deficiency anemia, unspecified: Secondary | ICD-10-CM | POA: Diagnosis not present

## 2018-07-19 IMAGING — CT CT CHEST W/O CM
2 of 5 series · 14 of 46 positions shown, 16 images · non-contrast
Comparison: 12/06/2017 abdominal/pelvic CT and 11/20/2017 chest CT

CLINICAL DATA: 67-year-old male with shortness of breath, fever and
abdominal distension.. History of cirrhosis.

EXAM:
CT CHEST, ABDOMEN AND PELVIS WITHOUT CONTRAST
TECHNIQUE: Multidetector CT imaging of the chest, abdomen and pelvis was
performed following the standard protocol without IV contrast.

[Series 5: cap w/o 3.0 mm st cor · coronal · non-contrast · 0.68mm/px · 3 of 96 slices shown]
[im 32/96  soft-tissue]
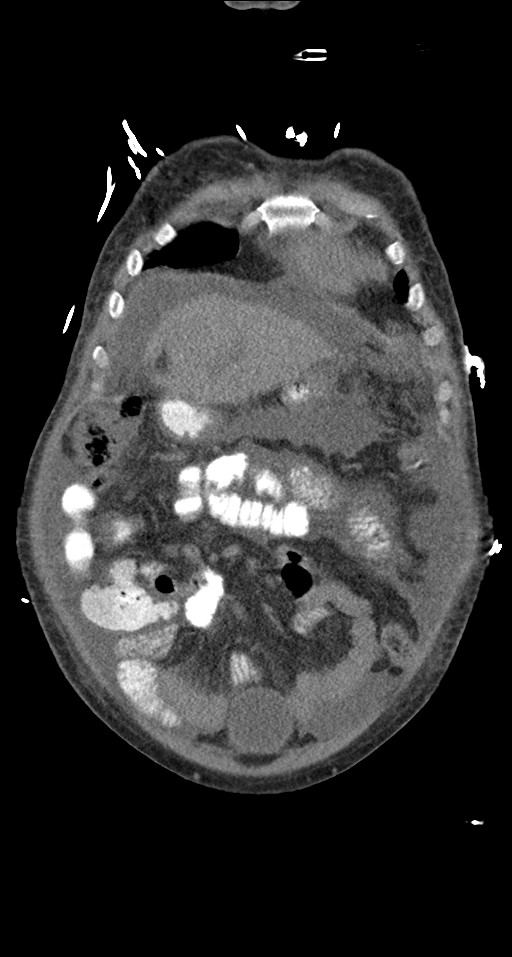
[im 43/96  soft-tissue]
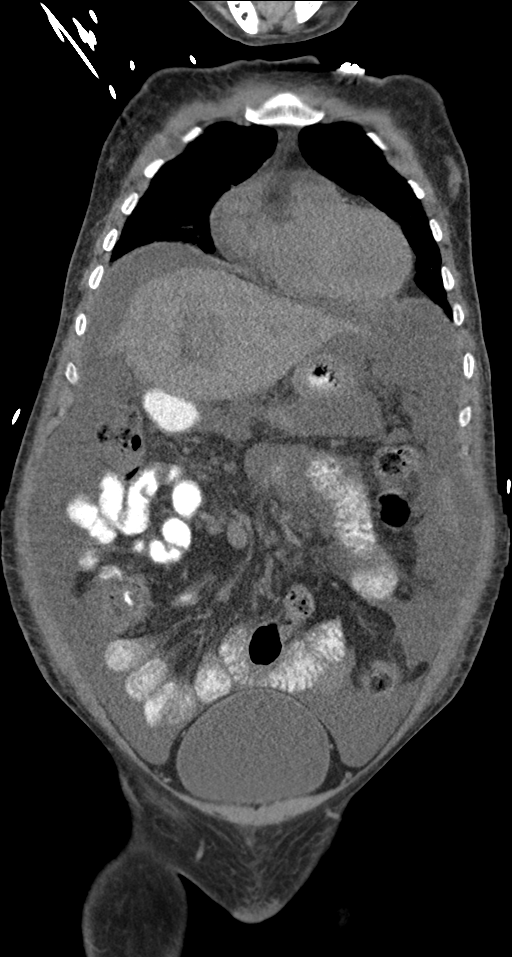
[im 53/96  soft-tissue]
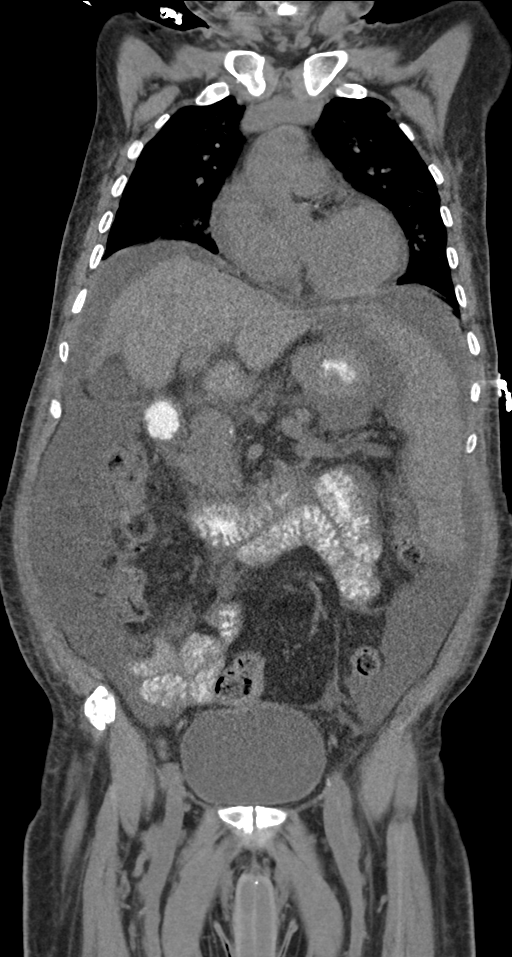

[Series 8: cap w/o 2.0 mm st · axial · non-contrast · 0.68mm/px · z∈[+781,+1355]mm · 11 of 327 slices shown, 13 images]
[im 20/327  soft-tissue]
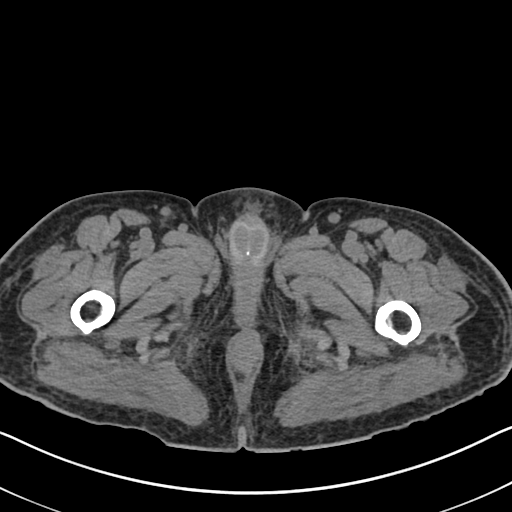
[im 20/327  bone]
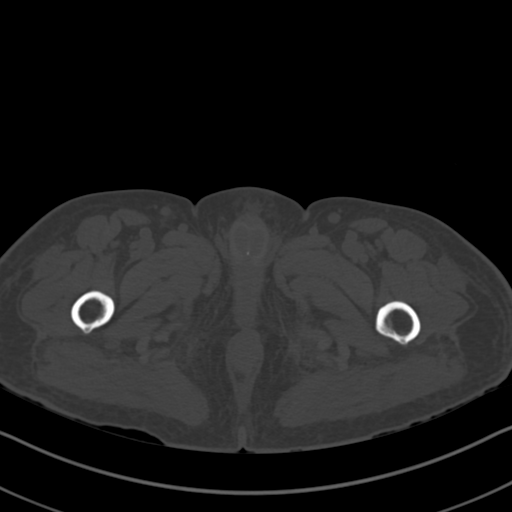
[im 58/327  soft-tissue]
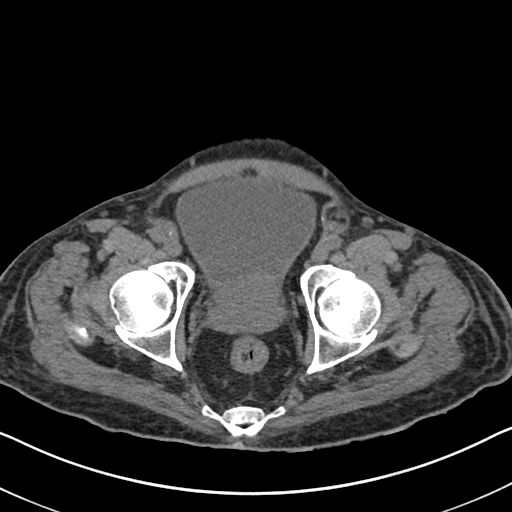
[im 77/327  soft-tissue]
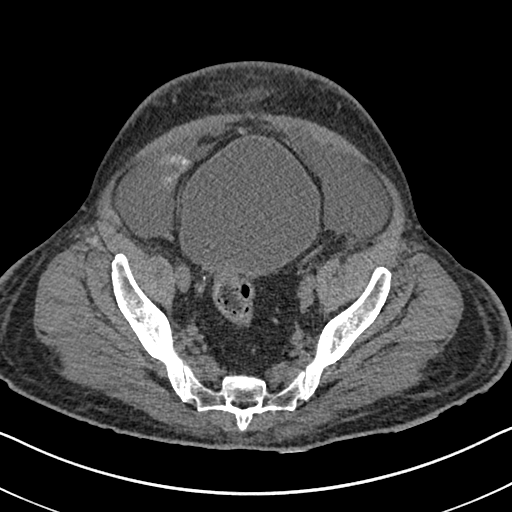
[im 116/327  soft-tissue]
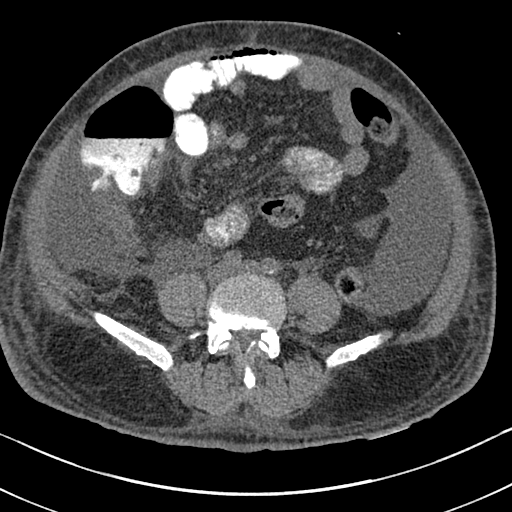
[im 135/327  soft-tissue]
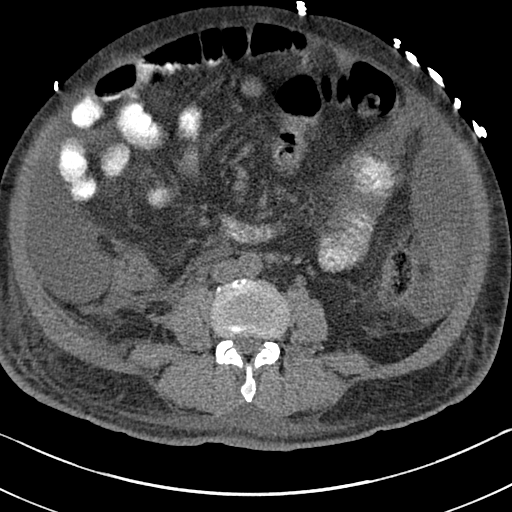
[im 173/327  soft-tissue]
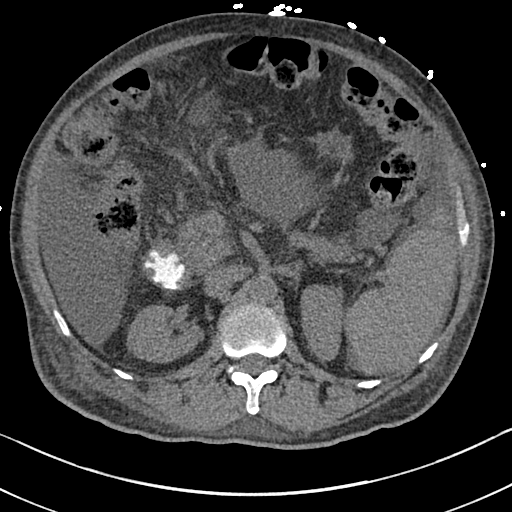
[im 192/327  soft-tissue]
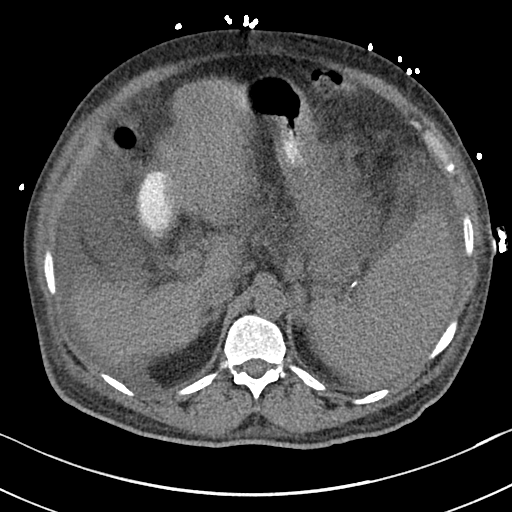
[im 211/327  soft-tissue]
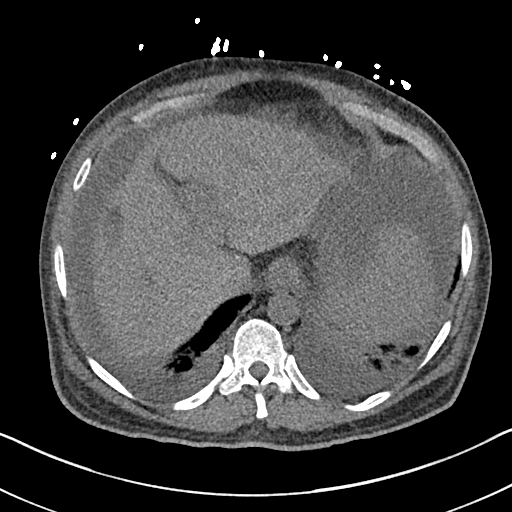
[im 250/327  soft-tissue]
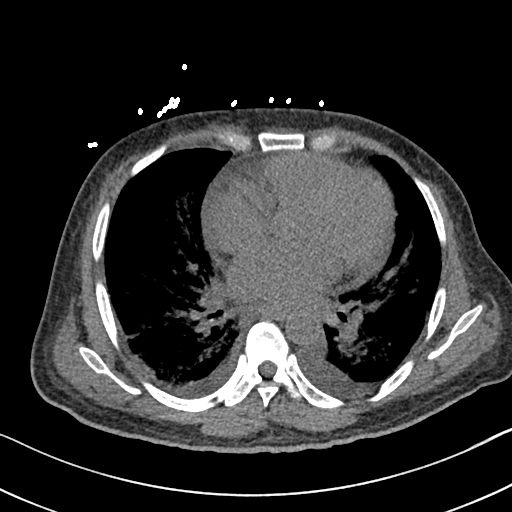
[im 250/327  bone]
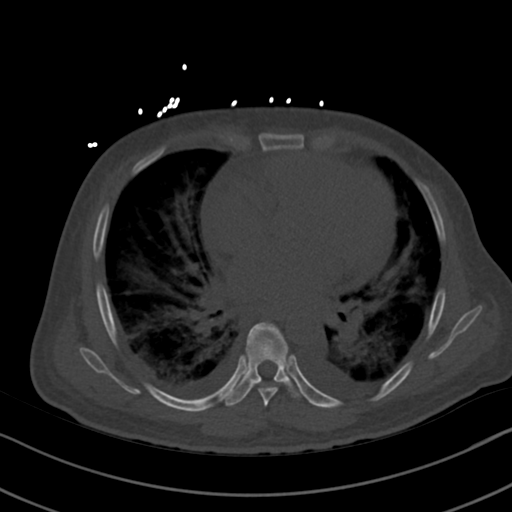
[im 269/327  soft-tissue]
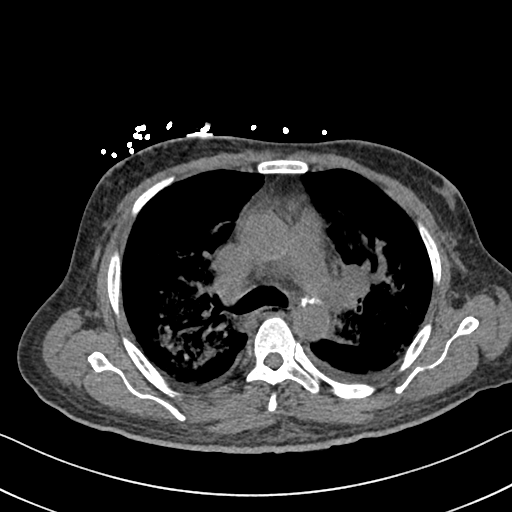
[im 307/327  soft-tissue]
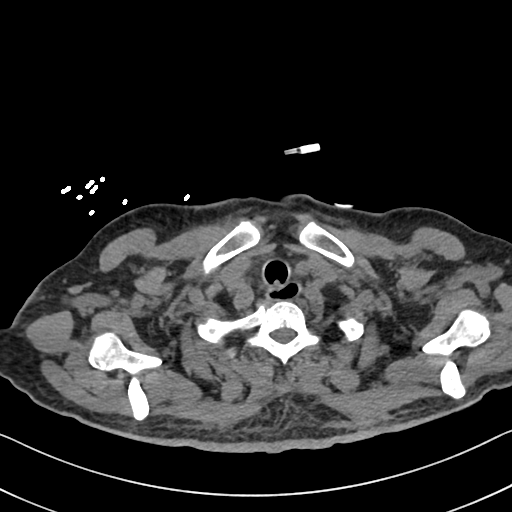

[14 of 46 positions shown; findings below may reference images not displayed]

FINDINGS: Please note that parenchymal abnormalities may be missed without
intravenous contrast.

CT CHEST FINDINGS

Cardiovascular: Cardiomegaly and coronary/thoracic aortic
atherosclerotic calcifications again noted. A small pericardial
effusion is unchanged. There is no evidence of thoracic aortic
aneurysm.

Mediastinum/Nodes: No enlarged mediastinal, hilar, or axillary lymph
nodes. Thyroid gland, trachea, and esophagus demonstrate no
significant findings.

Lungs/Pleura: Diffuse bilateral airspace opacities, primarily
central, are noted likely representing edema but
infection/superimposed infection is not excluded. Small bilateral
pleural effusions are noted. Bibasilar atelectasis identified. There
is no evidence of pneumothorax.

Musculoskeletal: No acute abnormality

CT ABDOMEN PELVIS FINDINGS

Hepatobiliary: Cirrhosis changes identified. No gross focal hepatic
abnormality noted. No definite gallbladder abnormality. No biliary
dilatation.

Pancreas: Unremarkable

Spleen: Splenomegaly again identified.

Adrenals/Urinary Tract: The kidneys, adrenal glands and bladder are
unremarkable.

Stomach/Bowel: Stomach is within normal limits. Appendix is
unremarkable. No evidence of bowel wall thickening, distention, or
inflammatory changes.

Vascular/Lymphatic: Aortic atherosclerosis. No enlarged abdominal or
pelvic lymph nodes.

Reproductive: Prostate enlargement again noted

Other: A moderate to large amount of ascites is again noted and
relatively unchanged. No evidence of pneumoperitoneum. Subcutaneous
edema identified.

Musculoskeletal: No acute or suspicious bony abnormalities.
IMPRESSION: 1. Diffuse bilateral airspace opacities likely representing
pulmonary edema. Infection/superimposed infection not excluded.
2. Moderate to large amount of ascites again noted with small
bilateral pleural effusions and diffuse subcutaneous edema.
3. Cirrhosis and splenomegaly.
4. Cardiomegaly and small pericardial effusion.
5. Coronary artery and Aortic Atherosclerosis ([id number]-[id number].L).
6. Prostatomegaly.

## 2018-07-20 ENCOUNTER — Encounter: Payer: Self-pay | Admitting: Endocrinology

## 2018-07-20 ENCOUNTER — Ambulatory Visit (INDEPENDENT_AMBULATORY_CARE_PROVIDER_SITE_OTHER): Payer: Medicare Other | Admitting: Endocrinology

## 2018-07-20 ENCOUNTER — Ambulatory Visit: Payer: Medicare Other | Admitting: Endocrinology

## 2018-07-20 VITALS — BP 150/70 | Ht 64.0 in | Wt 136.2 lb

## 2018-07-20 DIAGNOSIS — Z992 Dependence on renal dialysis: Secondary | ICD-10-CM | POA: Diagnosis not present

## 2018-07-20 DIAGNOSIS — E1122 Type 2 diabetes mellitus with diabetic chronic kidney disease: Secondary | ICD-10-CM

## 2018-07-20 DIAGNOSIS — Z794 Long term (current) use of insulin: Secondary | ICD-10-CM | POA: Diagnosis not present

## 2018-07-20 DIAGNOSIS — N186 End stage renal disease: Secondary | ICD-10-CM

## 2018-07-20 MED ORDER — INSULIN LISPRO (1 UNIT DIAL) 100 UNIT/ML (KWIKPEN): PEN_INJECTOR | SUBCUTANEOUS | 1 refills | Status: DC

## 2018-07-20 NOTE — Patient Instructions (Signed)
Humalog replaced by: Novolin R insulin Relion brand, take 30 min before meals: 8 at Bfst, 12 at lunch and 10 at dinner  [place] 16 in am

## 2018-07-20 NOTE — Progress Notes (Signed)
Patient ID: William [name], male   DOB: 01-04-1951, 67 y.o.   MRN: [id number]          Reason for Appointment: Follow-up for Type 2 Diabetes  Referring physician: Dr. Carole Civil   History of Present Illness:          Date of diagnosis of type 2 diabetes mellitus: 1990s       Background history:   He has been on insulin for about 15 years, previously taking unknown oral agents Has been usually taking insulin with a syringe and only one type of premixed insulin Previous A1c has ranged between 6.6 and 9.2 in the last 2 years that records are available  Recent history:   Most recent A1c is 7.7, previously was 7% done in 8/19  INSULIN regimen is:  Toujeo 12 units  Humalog at mealtimes 6-8-8  He has his dialysis 3 times a week at 11 AM  Current management, blood sugar patterns and problems identified:  His premixed insulin was changed to Toujeo and Humalog  However even though he had an interim follow-up he is now coming back to see me almost 3 months later  His blood sugars are markedly increased around dinnertime in the evening  Apparently is eating his main meal at lunchtime and no snacks between lunch and dinner but not clear why his blood sugars are extremely variable when he has his late evening dinner  Taking Toujeo around dinnertime also  With this his FASTING blood sugars are still averaging near 200 and quite variable also  His daughter-in-law is helping with his insulin injections  His had only 1 transient episode of hypoglycemia at about 6 PM with blood sugar of 53 and does not know why it was low  Does not check blood sugars AFTER his meals especially after supper because he goes to sleep right afterwards            Side effects from medications have been: None known  Compliance with the medical regimen: Fair     Meal times are:  Breakfast is at 10-11 AM lunch: 2.30 Dinner: 10-11 PM                                                                                                                         Typical meal intake: Breakfast is  an egg, bread and some fruit.  His daughter-in-law is usually doing the cooking              Exercise:  Walking 15-30 minutes about every other day in the late afternoon  Glucose monitoring:  done 2-3 times a day         Glucometer: brand.     One Touch variable    Blood Glucose readings by    PRE-MEAL Fasting Lunch Dinner Bedtime Overall  Glucose range:  90-328  53-310  126-540    Mean/median:   234  293   219   POST-MEAL PC Breakfast PC Lunch  PC Dinner  Glucose range:   ?  Mean/median:       CDE visit: 05/2018 Dietician visit, most recent: Never except some interaction in the hospital  Weight history:  Wt Readings from Last 3 Encounters:  07/20/18 136 lb 3.2 oz (61.8 kg)  06/29/18 136 lb 11 oz (62 kg)  05/25/18 136 lb 9.6 oz (62 kg)    Glycemic control:   Lab Results  Component Value Date   HGBA1C 7.7 (H) 07/09/2018   HGBA1C 7.0 (H) 03/09/2018   HGBA1C 6.6 (H) 02/18/2018   Lab Results  Component Value Date   MICROALBUR 91.4 08/25/2015   LDLCALC 93 05/21/2018   CREATININE 4.04 ([place]) 03/09/2018   No results found for: MICRALBCREAT  Lab Results  Component Value Date   FRUCTOSAMINE 524 (H) 05/21/2018    No visits with results within 1 Week(s) from this visit.  Latest known visit with results is:  Lab on 07/09/2018  Component Date Value Ref Range Status  . Glucose, Bld 07/09/2018 206* 70 - 99 mg/dL Final  . Hgb A1c MFr Bld 07/09/2018 7.7* 4.6 - 6.5 % Final   Glycemic Control Guidelines for People with Diabetes:Non Diabetic:  <6%Goal of Therapy: <7%Additional Action Suggested:  >8%     Allergies as of 07/20/2018      Reactions   Heparin Other (See Comments)   Patient is Muslim and is not permitted Pork derative due to religious belief)   Pork-derived Products Other (See Comments)   Patient does not eat pork due to religious beliefs      Medication List       Accurate as of  July 20, 2018 10:13 AM. Always use your most recent med list.        acetaminophen 650 MG CR tablet Commonly known as:  TYLENOL Take 650 mg by mouth every 8 (eight) hours as needed for pain.   amLODipine 10 MG tablet Commonly known as:  NORVASC Take 1 tablet (10 mg total) by mouth daily.   calcium acetate 667 MG capsule Commonly known as:  PHOSLO Take 2 capsules (1,334 mg total) by mouth 3 (three) times daily with meals.   carvedilol 12.5 MG tablet Commonly known as:  COREG Take 12.5 mg by mouth 2 (two) times daily with a meal.   insulin aspart 100 UNIT/ML injection Commonly known as:  novoLOG Inject into the skin 3 (three) times daily before meals. Inject 6 units under the skin at breakfast, and 8 units at lunch and supper.   Insulin Glargine 300 UNIT/ML Sopn Commonly known as:  TOUJEO MAX SOLOSTAR Inject 12 Units into the skin daily.   insulin lispro 100 UNIT/ML KwikPen Commonly known as:  HUMALOG KWIKPEN 8 at Bfst, 12 at lunch and 10 at dinner   Insulin Pen Needle 31G X 5 MM Misc Commonly known as:  ADVOCATE INSULIN PEN NEEDLES Use four times daily to inject insulin   IRON 27 240 (27 FE) MG tablet Generic drug:  ferrous gluconate Take 240 mg by mouth daily.   multivitamin with minerals tablet Take 1 tablet by mouth daily.   ondansetron 4 MG tablet Commonly known as:  ZOFRAN Take 1 tablet (4 mg total) by mouth every 8 (eight) hours as needed for nausea or vomiting.   ONE TOUCH LANCETS Misc Use to test blood sugar three times daily   ONETOUCH VERIO test strip Generic drug:  glucose blood 1 each by Other route as needed for other. Use as instructed  glucose blood test strip Commonly known as:  ONETOUCH VERIO Use to check blood sugar 3 times a day dx e11.65   rosuvastatin 5 MG tablet Commonly known as:  CRESTOR Take 1 tablet (5 mg total) by mouth daily.   SYSTANE 0.4-0.3 % Soln Generic drug:  Polyethyl Glycol-Propyl Glycol Place 1 drop into both  eyes 3 (three) times daily as needed (for dry eyes).       Allergies:  Allergies  Allergen Reactions  . Heparin Other (See Comments)    Patient is Muslim and is not permitted Pork derative due to religious belief)  . Pork-Derived Products Other (See Comments)    Patient does not eat pork due to religious beliefs    Past Medical History:  Diagnosis Date  . Anemia   . CHF (congestive heart failure) ([place])   . Cirrhosis ([place])   . Cirrhosis ([place])   . CKD (chronic kidney disease), stage IV ([place])   . Goodpasture's disease [facility])    on outpatient plasmapheresis/notes 11/20/2017  . Grade I diastolic dysfunction 40/98/1191  . History of anemia due to chronic kidney disease   . History of blood transfusion X 1   "UGIB; low blood count"  . History of plasmapheresis    "qod" (11/20/2017)  . Hypertension   . Pancytopenia ([place]) 08/20/2017  . Type II diabetes mellitus ([place])     Past Surgical History:  Procedure Laterality Date  . AV FISTULA PLACEMENT Left 12/26/2017   Procedure: LEFT BRACHIOCEPHALIC ARTERIOVENOUS (AV) FISTULA CREATION;  Surgeon: Conrad Wickliffe, MD;  Location: [place];  Service: Vascular;  Laterality: Left;  . BASCILIC VEIN TRANSPOSITION Left 02/16/2018   Procedure: BRACHIOBASILIC VEIN TRANSPOSITION SECOND STAGE;  Surgeon: Conrad Floris, MD;  Location: [place];  Service: Vascular;  Laterality: Left;  . CATARACT EXTRACTION W/ INTRAOCULAR LENS  IMPLANT, BILATERAL Bilateral   . ESOPHAGEAL BANDING N/A 04/01/2018   Procedure: ESOPHAGEAL BANDING;  Surgeon: Otis Brace, MD;  Location: [facility];  Service: Gastroenterology;  Laterality: N/A;  . ESOPHAGEAL BANDING N/A 06/29/2018   Procedure: ESOPHAGEAL BANDING;  Surgeon: Otis Brace, MD;  Location: [facility];  Service: Gastroenterology;  Laterality: N/A;  . ESOPHAGOGASTRODUODENOSCOPY (EGD) WITH PROPOFOL N/A 04/01/2018   Procedure: ESOPHAGOGASTRODUODENOSCOPY (EGD) WITH PROPOFOL;  Surgeon: Otis Brace, MD;  [facility];  Service: Gastroenterology;  Laterality: N/A;  . ESOPHAGOGASTRODUODENOSCOPY (EGD) WITH PROPOFOL N/A 06/29/2018   Procedure: ESOPHAGOGASTRODUODENOSCOPY (EGD) WITH PROPOFOL;  Surgeon: Otis Brace, MD;  Location: [facility];  Service: Gastroenterology;  Laterality: N/A;  . I&D EXTREMITY Left 02/18/2018   Procedure: IRRIGATION AND DEBRIDEMENT LEFT ARM;  Surgeon: Serafina Mitchell, MD;  Location: [place];  Service: Vascular;  Laterality: Left;  . INSERTION OF DIALYSIS CATHETER N/A 01/19/2018   Procedure: INSERTION OF DIALYSIS CATHETER - RIGHT INTERNAL JUGULAR PLACEMENT;  Surgeon: Rosetta Posner, MD;  Location: [place];  Service: Vascular;  Laterality: N/A;  . IR FLUORO GUIDE CV LINE RIGHT  11/10/2017  . IR PARACENTESIS  12/10/2017  . IR PARACENTESIS  12/26/2017  . IR REMOVAL TUN CV CATH W/O FL  11/28/2017  . IR US GUIDE VASC ACCESS RIGHT  11/10/2017  . NECK SURGERY  2007   "back of neck; no hardware in there"    Family History  Problem Relation Age of Onset  . Diabetes Mellitus II Sister   . Diabetes Sister   . Stroke Brother   . Heart attack Brother     Social History:  reports that he has  never smoked. He has never used smokeless tobacco. He reports that he does not drink alcohol or use drugs.   Review of Systems   Lipid history: Baseline LDL 136, also has family history of CAD and currently doing better with starting Crestor 5 mg daily    Lab Results  Component Value Date   CHOL 150 05/21/2018   HDL 44.80 05/21/2018   [place] 93 05/21/2018   TRIG 61.0 05/21/2018   CHOLHDL 3 05/21/2018           He reportedly has cirrhosis of the liver from unknown cause  Lab Results  Component Value Date   ALT 21 05/21/2018   ALT 16 01/07/2018   Hypertension: Has been present for several years managed by nephrologist  BP Readings from Last 3 Encounters:  07/20/18 (!) 150/70  06/29/18 (!) 129/52  05/25/18 130/68    Most recent eye exam was in 7/19  Most recent foot exam:  9/19  Currently known complications of diabetes: Nephropathy?,  Unknown status of retinopathy  On dialysis since about 5/19  LABS:  No visits with results within 1 Week(s) from this visit.  Latest known visit with results is:  Lab on 07/09/2018  Component Date Value Ref Range Status  . Glucose, Bld 07/09/2018 206* 70 - 99 mg/dL Final  . Hgb A1c MFr Bld 07/09/2018 7.7* 4.6 - 6.5 % Final   Glycemic Control Guidelines for People with Diabetes:Non Diabetic:  <6%Goal of Therapy: <7%Additional Action Suggested:  >8%     Physical Examination:  BP (!) 150/70 (BP Location: Right Arm, Patient Position: Sitting, Cuff Size: Normal)   Ht 5' 4"  (1.626 m)   Wt 136 lb 3.2 oz (61.8 kg)   SpO2 97%   BMI 23.38 kg/m       ASSESSMENT:  Diabetes type 2, insulin-dependent with poor control  See history of present illness for detailed discussion of current diabetes management, blood sugar patterns and problems identified  Although his A1c is 7.7 his average blood sugar at home is almost 240 recently  He is clearly insulin deficient However his blood sugars are extremely variable especially for dinnertime His caregiver is his daughter-in-law but his history was given by his son  Not clear if he is consistently taking his insulin when he is eating lunch and also having dialysis late morning  BASAL insulin amount is variable but not clear if his Toujeo was active 24 hours with taking it in the evening Most likely he needs more mealtime insulin most of the time because of his blood sugars are mostly high at lunch and dinner Cannot assess postprandial readings after dinner because he goes to sleep right afterwards Currently not checking blood sugars enough to be eligible for the freestyle libre  Also apparently may be in the donut hole as Novolog is not affordable for him  HYPERCHOLESTEROLEMIA: Controlled with Crestor now   PLAN:   Change NovoLog to [employer] brand Novolin R insulin with the  same dose Discussed need to take this preferably 30 minutes before eating He will use a syringe for this and is able to do this He will change the TOUJEO to the morning and increase the dose to 16 units Increase lunchtime coverage by 4 units and dinnertime coverage by 2 units for now More readings 2 hours after meals Consultation with dietitian for meal planning to see if any dietary factors can be improved Follow-up in 1 month to reassess level of control  Patient Instructions  Humalog  replaced by: Novolin R insulin [employer] brand, take 30 min before meals: 8 at Bfst, 12 at lunch and 10 at dinner  Toujeo 16 in am   Counseling time on subjects discussed in assessment and plan sections is over 50% of today's 25 minute visit   Elayne Snare 07/20/2018, 10:13 AM   Note: This office note was prepared with Dragon voice recognition system technology. Any transcriptional errors that result from this process are unintentional.

## 2018-07-21 DIAGNOSIS — D631 Anemia in chronic kidney disease: Secondary | ICD-10-CM | POA: Diagnosis not present

## 2018-07-21 DIAGNOSIS — D509 Iron deficiency anemia, unspecified: Secondary | ICD-10-CM | POA: Diagnosis not present

## 2018-07-21 DIAGNOSIS — N2581 Secondary hyperparathyroidism of renal origin: Secondary | ICD-10-CM | POA: Diagnosis not present

## 2018-07-21 DIAGNOSIS — N186 End stage renal disease: Secondary | ICD-10-CM | POA: Diagnosis not present

## 2018-07-23 DIAGNOSIS — D509 Iron deficiency anemia, unspecified: Secondary | ICD-10-CM | POA: Diagnosis not present

## 2018-07-23 DIAGNOSIS — N186 End stage renal disease: Secondary | ICD-10-CM | POA: Diagnosis not present

## 2018-07-23 DIAGNOSIS — N2581 Secondary hyperparathyroidism of renal origin: Secondary | ICD-10-CM | POA: Diagnosis not present

## 2018-07-23 DIAGNOSIS — D631 Anemia in chronic kidney disease: Secondary | ICD-10-CM | POA: Diagnosis not present

## 2018-07-25 DIAGNOSIS — D509 Iron deficiency anemia, unspecified: Secondary | ICD-10-CM | POA: Diagnosis not present

## 2018-07-25 DIAGNOSIS — N186 End stage renal disease: Secondary | ICD-10-CM | POA: Diagnosis not present

## 2018-07-25 DIAGNOSIS — N2581 Secondary hyperparathyroidism of renal origin: Secondary | ICD-10-CM | POA: Diagnosis not present

## 2018-07-25 DIAGNOSIS — D631 Anemia in chronic kidney disease: Secondary | ICD-10-CM | POA: Diagnosis not present

## 2018-07-26 IMAGING — US IR PARACENTESIS
1 series · 3 of 3 positions shown · non-contrast
Comparison: none

INDICATION: Patient with history of cirrhosis, abdominal distention. Request is
made for diagnostic and therapeutic paracentesis.

[Series 1: ir paracentesis · 3 of 3 slices shown]
[im 1/3]
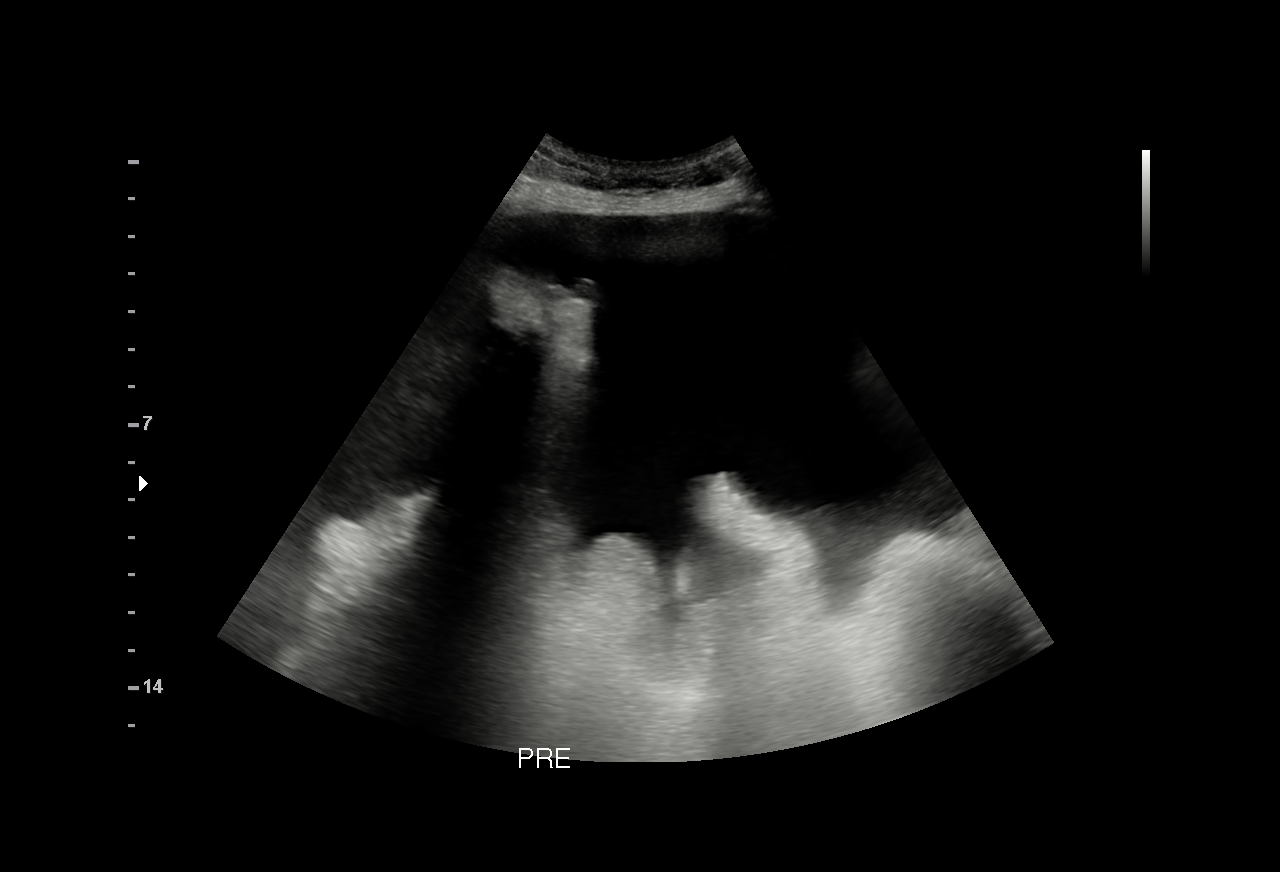
[im 2/3]
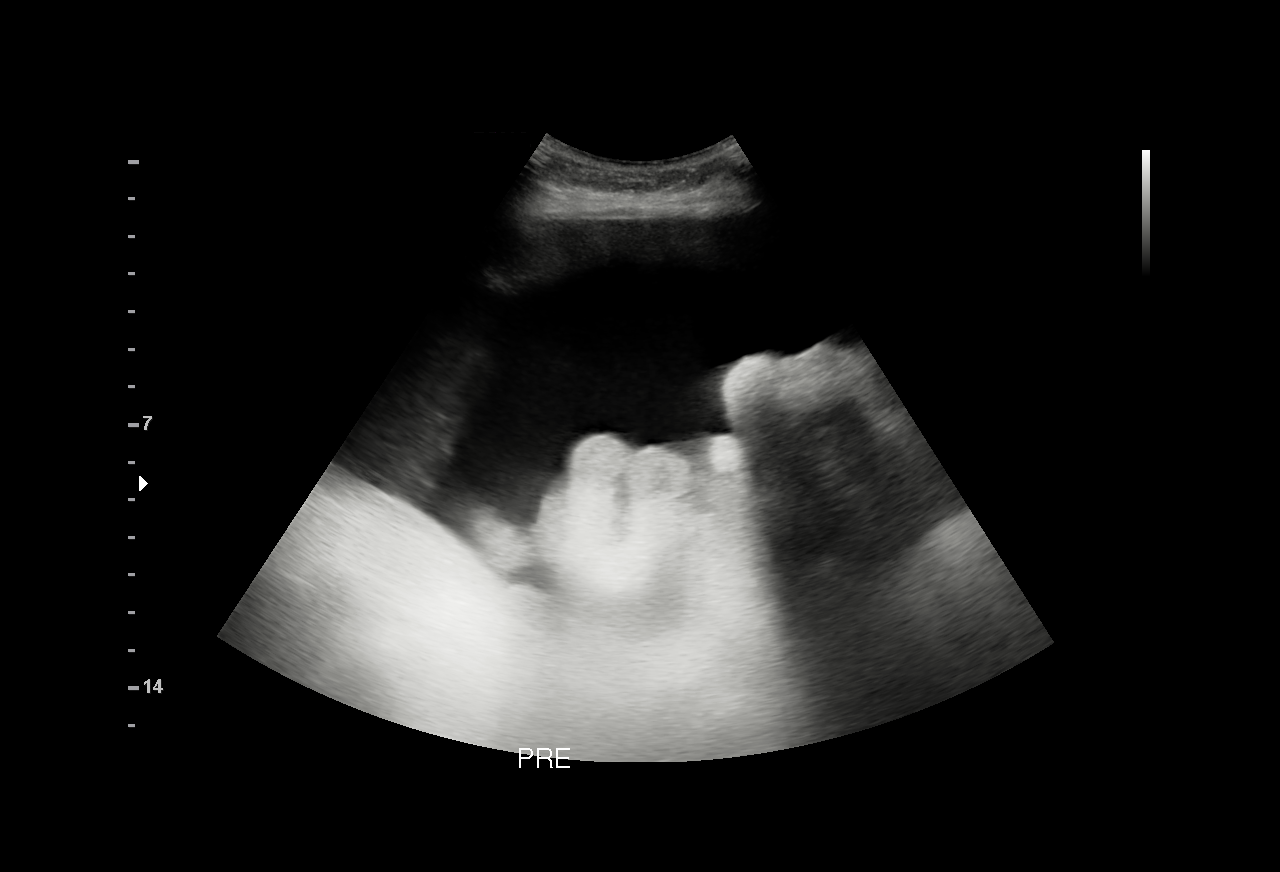
[im 3/3]
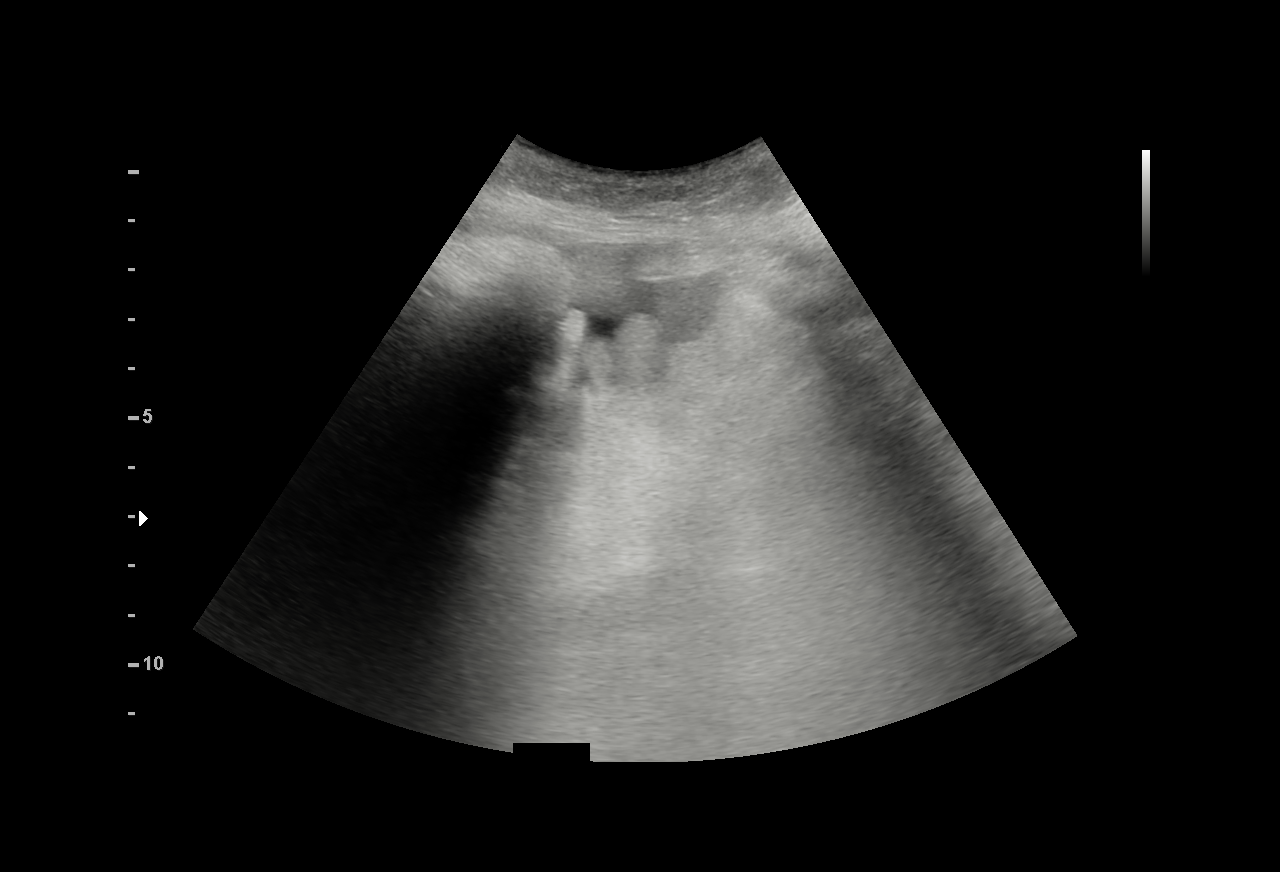

[3 of 3 positions shown; findings below may reference images not displayed]

EXAM:
ULTRASOUND GUIDED DIAGNOSTIC AND THERAPEUTIC PARACENTESIS

MEDICATIONS:
10 mL 2% lidocaine

COMPLICATIONS:
None immediate.

PROCEDURE:
Informed written consent was obtained from the patient after a
discussion of the risks, benefits and alternatives to treatment. A
timeout was performed prior to the initiation of the procedure.

Initial ultrasound scanning demonstrates a moderate amount of
ascites within the left lateral abdomen. The left lateral abdomen
was prepped and draped in the usual sterile fashion. 2% lidocaine
was used for local anesthesia.

Following this, a 19 gauge, 7-cm, Yueh catheter was introduced. An
ultrasound image was saved for documentation purposes. The
paracentesis was performed. The catheter was removed and a dressing
was applied. The patient tolerated the procedure well without
immediate post procedural complication.
FINDINGS: A total of approximately 3.1 liters of clear, yellow fluid was
removed. Samples were sent to the laboratory as requested by the
clinical team.
IMPRESSION: Successful ultrasound-guided diagnostic and therapeutic paracentesis
yielding 3.1 liters of peritoneal fluid.

## 2018-07-27 DIAGNOSIS — N2581 Secondary hyperparathyroidism of renal origin: Secondary | ICD-10-CM | POA: Diagnosis not present

## 2018-07-27 DIAGNOSIS — N186 End stage renal disease: Secondary | ICD-10-CM | POA: Diagnosis not present

## 2018-07-27 DIAGNOSIS — D631 Anemia in chronic kidney disease: Secondary | ICD-10-CM | POA: Diagnosis not present

## 2018-07-27 DIAGNOSIS — D509 Iron deficiency anemia, unspecified: Secondary | ICD-10-CM | POA: Diagnosis not present

## 2018-07-30 DIAGNOSIS — D631 Anemia in chronic kidney disease: Secondary | ICD-10-CM | POA: Diagnosis not present

## 2018-07-30 DIAGNOSIS — N2581 Secondary hyperparathyroidism of renal origin: Secondary | ICD-10-CM | POA: Diagnosis not present

## 2018-07-30 DIAGNOSIS — D509 Iron deficiency anemia, unspecified: Secondary | ICD-10-CM | POA: Diagnosis not present

## 2018-07-30 DIAGNOSIS — N186 End stage renal disease: Secondary | ICD-10-CM | POA: Diagnosis not present

## 2018-08-01 DIAGNOSIS — D509 Iron deficiency anemia, unspecified: Secondary | ICD-10-CM | POA: Diagnosis not present

## 2018-08-01 DIAGNOSIS — N186 End stage renal disease: Secondary | ICD-10-CM | POA: Diagnosis not present

## 2018-08-01 DIAGNOSIS — D631 Anemia in chronic kidney disease: Secondary | ICD-10-CM | POA: Diagnosis not present

## 2018-08-01 DIAGNOSIS — N2581 Secondary hyperparathyroidism of renal origin: Secondary | ICD-10-CM | POA: Diagnosis not present

## 2018-08-03 DIAGNOSIS — D509 Iron deficiency anemia, unspecified: Secondary | ICD-10-CM | POA: Diagnosis not present

## 2018-08-03 DIAGNOSIS — N2581 Secondary hyperparathyroidism of renal origin: Secondary | ICD-10-CM | POA: Diagnosis not present

## 2018-08-03 DIAGNOSIS — N186 End stage renal disease: Secondary | ICD-10-CM | POA: Diagnosis not present

## 2018-08-03 DIAGNOSIS — D631 Anemia in chronic kidney disease: Secondary | ICD-10-CM | POA: Diagnosis not present

## 2018-08-05 DIAGNOSIS — N186 End stage renal disease: Secondary | ICD-10-CM | POA: Diagnosis not present

## 2018-08-05 DIAGNOSIS — E1122 Type 2 diabetes mellitus with diabetic chronic kidney disease: Secondary | ICD-10-CM | POA: Diagnosis not present

## 2018-08-05 DIAGNOSIS — Z992 Dependence on renal dialysis: Secondary | ICD-10-CM | POA: Diagnosis not present

## 2018-08-06 DIAGNOSIS — N2581 Secondary hyperparathyroidism of renal origin: Secondary | ICD-10-CM | POA: Diagnosis not present

## 2018-08-06 DIAGNOSIS — D631 Anemia in chronic kidney disease: Secondary | ICD-10-CM | POA: Diagnosis not present

## 2018-08-06 DIAGNOSIS — N186 End stage renal disease: Secondary | ICD-10-CM | POA: Diagnosis not present

## 2018-08-06 DIAGNOSIS — D509 Iron deficiency anemia, unspecified: Secondary | ICD-10-CM | POA: Diagnosis not present

## 2018-08-08 DIAGNOSIS — D509 Iron deficiency anemia, unspecified: Secondary | ICD-10-CM | POA: Diagnosis not present

## 2018-08-08 DIAGNOSIS — D631 Anemia in chronic kidney disease: Secondary | ICD-10-CM | POA: Diagnosis not present

## 2018-08-08 DIAGNOSIS — N2581 Secondary hyperparathyroidism of renal origin: Secondary | ICD-10-CM | POA: Diagnosis not present

## 2018-08-08 DIAGNOSIS — N186 End stage renal disease: Secondary | ICD-10-CM | POA: Diagnosis not present

## 2018-08-11 DIAGNOSIS — D631 Anemia in chronic kidney disease: Secondary | ICD-10-CM | POA: Diagnosis not present

## 2018-08-11 DIAGNOSIS — D509 Iron deficiency anemia, unspecified: Secondary | ICD-10-CM | POA: Diagnosis not present

## 2018-08-11 DIAGNOSIS — N186 End stage renal disease: Secondary | ICD-10-CM | POA: Diagnosis not present

## 2018-08-11 DIAGNOSIS — N2581 Secondary hyperparathyroidism of renal origin: Secondary | ICD-10-CM | POA: Diagnosis not present

## 2018-08-13 DIAGNOSIS — N2581 Secondary hyperparathyroidism of renal origin: Secondary | ICD-10-CM | POA: Diagnosis not present

## 2018-08-13 DIAGNOSIS — D631 Anemia in chronic kidney disease: Secondary | ICD-10-CM | POA: Diagnosis not present

## 2018-08-13 DIAGNOSIS — D509 Iron deficiency anemia, unspecified: Secondary | ICD-10-CM | POA: Diagnosis not present

## 2018-08-13 DIAGNOSIS — N186 End stage renal disease: Secondary | ICD-10-CM | POA: Diagnosis not present

## 2018-08-15 DIAGNOSIS — D631 Anemia in chronic kidney disease: Secondary | ICD-10-CM | POA: Diagnosis not present

## 2018-08-15 DIAGNOSIS — N2581 Secondary hyperparathyroidism of renal origin: Secondary | ICD-10-CM | POA: Diagnosis not present

## 2018-08-15 DIAGNOSIS — D509 Iron deficiency anemia, unspecified: Secondary | ICD-10-CM | POA: Diagnosis not present

## 2018-08-15 DIAGNOSIS — N186 End stage renal disease: Secondary | ICD-10-CM | POA: Diagnosis not present

## 2018-08-18 DIAGNOSIS — D631 Anemia in chronic kidney disease: Secondary | ICD-10-CM | POA: Diagnosis not present

## 2018-08-18 DIAGNOSIS — N2581 Secondary hyperparathyroidism of renal origin: Secondary | ICD-10-CM | POA: Diagnosis not present

## 2018-08-18 DIAGNOSIS — D509 Iron deficiency anemia, unspecified: Secondary | ICD-10-CM | POA: Diagnosis not present

## 2018-08-18 DIAGNOSIS — N186 End stage renal disease: Secondary | ICD-10-CM | POA: Diagnosis not present

## 2018-08-19 ENCOUNTER — Ambulatory Visit: Payer: Medicare Other | Admitting: Endocrinology

## 2018-08-20 DIAGNOSIS — N186 End stage renal disease: Secondary | ICD-10-CM | POA: Diagnosis not present

## 2018-08-20 DIAGNOSIS — D509 Iron deficiency anemia, unspecified: Secondary | ICD-10-CM | POA: Diagnosis not present

## 2018-08-20 DIAGNOSIS — N2581 Secondary hyperparathyroidism of renal origin: Secondary | ICD-10-CM | POA: Diagnosis not present

## 2018-08-20 DIAGNOSIS — E1129 Type 2 diabetes mellitus with other diabetic kidney complication: Secondary | ICD-10-CM | POA: Diagnosis not present

## 2018-08-20 DIAGNOSIS — D631 Anemia in chronic kidney disease: Secondary | ICD-10-CM | POA: Diagnosis not present

## 2018-08-22 DIAGNOSIS — D631 Anemia in chronic kidney disease: Secondary | ICD-10-CM | POA: Diagnosis not present

## 2018-08-22 DIAGNOSIS — D509 Iron deficiency anemia, unspecified: Secondary | ICD-10-CM | POA: Diagnosis not present

## 2018-08-22 DIAGNOSIS — N2581 Secondary hyperparathyroidism of renal origin: Secondary | ICD-10-CM | POA: Diagnosis not present

## 2018-08-22 DIAGNOSIS — N186 End stage renal disease: Secondary | ICD-10-CM | POA: Diagnosis not present

## 2018-08-24 ENCOUNTER — Ambulatory Visit (INDEPENDENT_AMBULATORY_CARE_PROVIDER_SITE_OTHER): Payer: Medicare Other | Admitting: Endocrinology

## 2018-08-24 ENCOUNTER — Encounter: Payer: Self-pay | Admitting: Endocrinology

## 2018-08-24 VITALS — BP 190/100 | HR 85 | Ht 64.0 in | Wt 142.4 lb

## 2018-08-24 DIAGNOSIS — H353211 Exudative age-related macular degeneration, right eye, with active choroidal neovascularization: Secondary | ICD-10-CM | POA: Diagnosis not present

## 2018-08-24 DIAGNOSIS — I10 Essential (primary) hypertension: Secondary | ICD-10-CM | POA: Diagnosis not present

## 2018-08-24 DIAGNOSIS — E1165 Type 2 diabetes mellitus with hyperglycemia: Secondary | ICD-10-CM

## 2018-08-24 DIAGNOSIS — H31093 Other chorioretinal scars, bilateral: Secondary | ICD-10-CM | POA: Diagnosis not present

## 2018-08-24 DIAGNOSIS — Z961 Presence of intraocular lens: Secondary | ICD-10-CM | POA: Diagnosis not present

## 2018-08-24 DIAGNOSIS — H3582 Retinal ischemia: Secondary | ICD-10-CM | POA: Diagnosis not present

## 2018-08-24 DIAGNOSIS — E113552 Type 2 diabetes mellitus with stable proliferative diabetic retinopathy, left eye: Secondary | ICD-10-CM | POA: Diagnosis not present

## 2018-08-24 DIAGNOSIS — Z794 Long term (current) use of insulin: Secondary | ICD-10-CM

## 2018-08-24 DIAGNOSIS — E113311 Type 2 diabetes mellitus with moderate nonproliferative diabetic retinopathy with macular edema, right eye: Secondary | ICD-10-CM | POA: Diagnosis not present

## 2018-08-24 NOTE — Patient Instructions (Addendum)
Check blood sugars on waking up days a week  Also check blood sugars about 1-2 hours after meals and do this after different meals by rotation  Recommended blood sugar levels on waking up are 90-130 and about 2 hours after meal is 130-160  Please bring your blood sugar monitor to each visit, thank you  Toujeo 14 units in am, if am sugar < 90 then reduce dose to 12  If am sugar stays over 150 the go to 16 after 1 week   Regular insulin 6 in am, 8 at lunch and 8 at dinner Take when going out take 10 units   Add protein at dinner  Call sugars in 1 week

## 2018-08-24 NOTE — Progress Notes (Signed)
Patient ID: William [name], male   DOB: November 30, 1950, 68 y.o.   MRN: [id number]          Reason for Appointment: Follow-up for Type 2 Diabetes  Referring physician: Dr. Carole Civil   History of Present Illness:          Date of diagnosis of type 2 diabetes mellitus: 1990s       Background history:   He has been on insulin for about 15 years, previously taking unknown oral agents Has been usually taking insulin with a syringe and only one type of premixed insulin Previous A1c has ranged between 6.6 and 9.2 in the last 2 years that records are available  Recent history:   Most recent A1c is 7.7, previously was 7% done in 8/19  INSULIN regimen is:  Toujeo 12 units in a.m. Novolin R at mealtimes 6-8-8  He has his dialysis 3 times a week at 11 AM  Current management, blood sugar patterns and problems identified:  He was supposed to increase his TOUJEO units after his last visit but his son decided that he has continued the lower dose  However his blood sugars appeared to be somewhat better last month in the morning and are mostly higher FASTING since 1/12  Highest blood sugars appear to be late at night after his evening meal  He is reportedly taking his insulin consistently before meals except at least 1 day last week when he had gone out to eat and did not take it.  With this his blood sugars were over 500 at dinnertime for 2 days blood sugars are quite variable at home lunch and dinnertime and he does not know why  However at dinnertime he appears to be eating only grits and fruit and no protein, does have an egg in the morning for protein  Also during or after dialysis he may take protein supplement and not clear what the contents of this are  Usually blood sugars are at dinnertime although last month he had a significantly low sugar after lunch which is unusual  Also his family is afraid to increase his insulin since he had another couple of low sugars before lunchtime  earlier this month  THE HIGHEST blood sugars are before and after dinner with significant variability before dinner         Side effects from medications have been: None known  Compliance with the medical regimen: Fair     Meal times are:  Breakfast is at 10-11 AM lunch: 2.30 Dinner: 10-11 PM                                                                                                                       Typical meal intake: Breakfast is  an egg, bread and some fruit.  His daughter-in-law is usually doing the cooking        Dinner grits, fruit       Exercise:  Walking 15-30 minutes about every other day in the  late afternoon  Glucose monitoring:  done about 3 times a day         Glucometer: brand.     One Touch Verio    Blood Glucose readings by download of monitor   PRE-MEAL Fasting Lunch Dinner Bedtime Overall  Glucose range:  110-260  47-357  75-601    Mean/median:  183  156  203+/-109   193+/-113   POST-MEAL PC Breakfast PC Lunch PC Dinner  Glucose range:    297-440  Mean/median:    332    PREVIOUS readings:  PRE-MEAL Fasting Lunch Dinner Bedtime Overall  Glucose range:  90-328  53-310  126-540    Mean/median:   234  293   219   POST-MEAL PC Breakfast PC Lunch PC Dinner  Glucose range:   ?  Mean/median:       CDE visit: 05/2018 Dietician visit, most recent: Never except some interaction in the hospital  Weight history:  Wt Readings from Last 3 Encounters:  08/24/18 142 lb 6.4 oz (64.6 kg)  07/20/18 136 lb 3.2 oz (61.8 kg)  06/29/18 136 lb 11 oz (62 kg)    Glycemic control:   Lab Results  Component Value Date   HGBA1C 7.7 (H) 07/09/2018   HGBA1C 7.0 (H) 03/09/2018   HGBA1C 6.6 (H) 02/18/2018   Lab Results  Component Value Date   MICROALBUR 91.4 08/25/2015   LDLCALC 93 05/21/2018   CREATININE 4.04 ([place]) 03/09/2018   No results found for: MICRALBCREAT  Lab Results  Component Value Date   FRUCTOSAMINE 524 (H) 05/21/2018    No visits with  results within 1 Week(s) from this visit.  Latest known visit with results is:  Lab on 07/09/2018  Component Date Value Ref Range Status  . Glucose, Bld 07/09/2018 206* 70 - 99 mg/dL Final  . Hgb A1c MFr Bld 07/09/2018 7.7* 4.6 - 6.5 % Final   Glycemic Control Guidelines for People with Diabetes:Non Diabetic:  <6%Goal of Therapy: <7%Additional Action Suggested:  >8%     Allergies as of 08/24/2018      Reactions   Heparin Other (See Comments)   Patient is Muslim and is not permitted Pork derative due to religious belief)   Pork-derived Products Other (See Comments)   Patient does not eat pork due to religious beliefs      Medication List       Accurate as of August 24, 2018 11:50 AM. Always use your most recent med list.        acetaminophen 650 MG CR tablet Commonly known as:  TYLENOL Take 650 mg by mouth every 8 (eight) hours as needed for pain.   amLODipine 10 MG tablet Commonly known as:  NORVASC Take 1 tablet (10 mg total) by mouth daily.   calcium acetate 667 MG capsule Commonly known as:  PHOSLO Take 2 capsules (1,334 mg total) by mouth 3 (three) times daily with meals.   carvedilol 12.5 MG tablet Commonly known as:  COREG Take 12.5 mg by mouth 2 (two) times daily with a meal.   Insulin Glargine 300 UNIT/ML Sopn Commonly known as:  TOUJEO MAX SOLOSTAR Inject 12 Units into the skin daily.   Insulin Pen Needle 31G X 5 MM Misc Commonly known as:  ADVOCATE INSULIN PEN NEEDLES Use four times daily to inject insulin   insulin regular 100 units/mL injection Commonly known as:  NOVOLIN R,HUMULIN R Inject 6-8 Units into the skin 3 (three) times daily before meals. INJECT 6 UNITS  UNDER THE SKIN AT BREAKFAST, AND 8 UNITS AT LUNCH AND DINNER.   IRON 27 240 (27 FE) MG tablet Generic drug:  ferrous gluconate Take 240 mg by mouth daily.   multivitamin with minerals tablet Take 1 tablet by mouth daily.   ondansetron 4 MG tablet Commonly known as:  ZOFRAN Take 1  tablet (4 mg total) by mouth every 8 (eight) hours as needed for nausea or vomiting.   ONE TOUCH LANCETS Misc Use to test blood sugar three times daily   ONETOUCH VERIO test strip Generic drug:  glucose blood 1 each by Other route as needed for other. Use as instructed   glucose blood test strip Commonly known as:  ONETOUCH VERIO Use to check blood sugar 3 times a day dx e11.65   SYSTANE 0.4-0.3 % Soln Generic drug:  Polyethyl Glycol-Propyl Glycol Place 1 drop into both eyes 3 (three) times daily as needed (for dry eyes).       Allergies:  Allergies  Allergen Reactions  . Heparin Other (See Comments)    Patient is Muslim and is not permitted Pork derative due to religious belief)  . Pork-Derived Products Other (See Comments)    Patient does not eat pork due to religious beliefs    Past Medical History:  Diagnosis Date  . Anemia   . CHF (congestive heart failure) ([place])   . Cirrhosis ([place])   . Cirrhosis ([place])   . CKD (chronic kidney disease), stage IV ([place])   . Goodpasture's disease [facility])    on outpatient plasmapheresis/notes 11/20/2017  . Grade I diastolic dysfunction 81/44/8185  . History of anemia due to chronic kidney disease   . History of blood transfusion X 1   "UGIB; low blood count"  . History of plasmapheresis    "qod" (11/20/2017)  . Hypertension   . Pancytopenia ([place]) 08/20/2017  . Type II diabetes mellitus ([place])     Past Surgical History:  Procedure Laterality Date  . AV FISTULA PLACEMENT Left 12/26/2017   Procedure: LEFT BRACHIOCEPHALIC ARTERIOVENOUS (AV) FISTULA CREATION;  Surgeon: Conrad [place]loo, MD;  Location: [place];  Service: Vascular;  Laterality: Left;  . BASCILIC VEIN TRANSPOSITION Left 02/16/2018   Procedure: BRACHIOBASILIC VEIN TRANSPOSITION SECOND STAGE;  Surgeon: Conrad [place], MD;  Location: [place];  Service: Vascular;  Laterality: Left;  . CATARACT EXTRACTION W/ INTRAOCULAR LENS  IMPLANT, BILATERAL Bilateral   . ESOPHAGEAL BANDING N/A  04/01/2018   Procedure: ESOPHAGEAL BANDING;  Surgeon: Otis Brace, MD;  [facility];  Service: Gastroenterology;  Laterality: N/A;  . ESOPHAGEAL BANDING N/A 06/29/2018   Procedure: ESOPHAGEAL BANDING;  Surgeon: Otis Brace, MD;  Location: [facility];  Service: Gastroenterology;  Laterality: N/A;  . ESOPHAGOGASTRODUODENOSCOPY (EGD) WITH PROPOFOL N/A 04/01/2018   Procedure: ESOPHAGOGASTRODUODENOSCOPY (EGD) WITH PROPOFOL;  Surgeon: Otis Brace, MD;  [facility];  Service: Gastroenterology;  Laterality: N/A;  . ESOPHAGOGASTRODUODENOSCOPY (EGD) WITH PROPOFOL N/A 06/29/2018   Procedure: ESOPHAGOGASTRODUODENOSCOPY (EGD) WITH PROPOFOL;  Surgeon: Otis Brace, MD;  [facility];  Service: Gastroenterology;  Laterality: N/A;  . I&D EXTREMITY Left 02/18/2018   Procedure: IRRIGATION AND DEBRIDEMENT LEFT ARM;  Surgeon: Serafina Mitchell, MD;  Location: [place];  Service: Vascular;  Laterality: Left;  . INSERTION OF DIALYSIS CATHETER N/A 01/19/2018   Procedure: INSERTION OF DIALYSIS CATHETER - RIGHT INTERNAL JUGULAR PLACEMENT;  Surgeon: Rosetta Posner, MD;  Location: [place];  Service: Vascular;  Laterality: N/A;  . IR FLUORO GUIDE CV LINE RIGHT  11/10/2017  .  IR PARACENTESIS  12/10/2017  . IR PARACENTESIS  12/26/2017  . IR REMOVAL TUN CV CATH W/O FL  11/28/2017  . IR US GUIDE VASC ACCESS RIGHT  11/10/2017  . NECK SURGERY  2007   "back of neck; no hardware in there"    Family History  Problem Relation Age of Onset  . Diabetes Mellitus II Sister   . Diabetes Sister   . Stroke Brother   . Heart attack Brother     Social History:  reports that he has never smoked. He has never used smokeless tobacco. He reports that he does not drink alcohol or use drugs.   Review of Systems   Lipid history: Baseline LDL 136, also has family history of CAD, now LDL is at goal with Crestor 5 mg    Lab Results  Component Value Date   CHOL 150 05/21/2018   HDL 44.80  05/21/2018   [place] 93 05/21/2018   TRIG 61.0 05/21/2018   CHOLHDL 3 05/21/2018           He reportedly has cirrhosis of the liver from unknown cause  Lab Results  Component Value Date   ALT 21 05/21/2018   ALT 16 01/07/2018   Hypertension: Has been present for several years managed by nephrologist  BP Readings from Last 3 Encounters:  08/24/18 (!) 190/100  07/20/18 (!) 150/70  06/29/18 (!) 129/52    Most recent eye exam was in 7/19  Most recent foot exam: 9/19  Currently known complications of diabetes: Nephropathy?,  Unknown status of retinopathy  On dialysis since about 5/19  LABS:  No visits with results within 1 Week(s) from this visit.  Latest known visit with results is:  Lab on 07/09/2018  Component Date Value Ref Range Status  . Glucose, Bld 07/09/2018 206* 70 - 99 mg/dL Final  . Hgb A1c MFr Bld 07/09/2018 7.7* 4.6 - 6.5 % Final   Glycemic Control Guidelines for People with Diabetes:Non Diabetic:  <6%Goal of Therapy: <7%Additional Action Suggested:  >8%     Physical Examination:  BP (!) 190/100 (BP Location: Right Arm, Patient Position: Sitting, Cuff Size: Normal)   Pulse 85   Ht 5' 4"  (1.626 m)   Wt 142 lb 6.4 oz (64.6 kg)   SpO2 97%   BMI 24.44 kg/m       ASSESSMENT:  Diabetes type 2, insulin-dependent with poor control  See history of present illness for detailed discussion of current diabetes management, blood sugar patterns and problems identified  Last A1c is 7.7 As before his average blood sugar at home is still well over 200  Although he was having somewhat better blood sugars 2 to 4 weeks ago they are more consistently higher now His blood sugars are extremely variable before his evening meal which is late at night Most likely his diet is variable and not balanced except in the morning His family is afraid of his getting low sugars and does not adjust his insulin as directed usually Taking less basal insulin than prescribed Most  likely needs more insulin at lunchtime but recently sugars before dinnertime are not consistently high except when his diet is not good or he forgets his dosage Currently not getting enough basal insulin recent blood sugars in the mornings frequently over 200   PLAN:   Needs to take the Novolin R insulin consistently before each meal and preferably 30 minutes before eating Increase TOUJEO at least 2 units for the next week and then if the  morning sugars are still over 150 go up to 16 units Although he should be preferably taking more insulin at lunchtime will wait till next week to adjusted He needs to add a protein at dinnertime and not eat carbohydrate Did not reduce the insulin dose at mealtimes or skipped the dose even if blood sugars are near normal He may reduce her suppertime dose only if the blood sugars are below 100 Need to check sugars a couple of hours after eating or at bedtime more consistently to help adjust his mealtime dose Consider consultation with dietitian again  We will discuss his hypertension with nephrologist, will be going for dialysis tomorrow to be evaluated  Patient Instructions  Check blood sugars on waking up days a week  Also check blood sugars about 1-2 hours after meals and do this after different meals by rotation  Recommended blood sugar levels on waking up are 90-130 and about 2 hours after meal is 130-160  Please bring your blood sugar monitor to each visit, thank you  Toujeo 14 units in am, if am sugar < 90 then reduce dose to 12  If am sugar stays over 150 the go to 16 after 1 week   Regular insulin 6 in am, 8 at lunch and 8 at dinner Take when going out take 10 units   Add protein at dinner  Call sugars in 1 week     Counseling time on subjects discussed in assessment and plan sections is over 50% of today's 25 minute visit   Elayne Snare 08/24/2018, 11:50 AM   Note: This office note was prepared with Dragon voice recognition system  technology. Any transcriptional errors that result from this process are unintentional.

## 2018-08-25 ENCOUNTER — Other Ambulatory Visit: Payer: Self-pay

## 2018-08-25 DIAGNOSIS — N2581 Secondary hyperparathyroidism of renal origin: Secondary | ICD-10-CM | POA: Diagnosis not present

## 2018-08-25 DIAGNOSIS — N186 End stage renal disease: Secondary | ICD-10-CM | POA: Diagnosis not present

## 2018-08-25 DIAGNOSIS — D631 Anemia in chronic kidney disease: Secondary | ICD-10-CM | POA: Diagnosis not present

## 2018-08-25 DIAGNOSIS — D509 Iron deficiency anemia, unspecified: Secondary | ICD-10-CM | POA: Diagnosis not present

## 2018-08-25 MED ORDER — GLUCOSE BLOOD VI STRP: ORAL_STRIP | 3 refills | Status: DC

## 2018-08-27 DIAGNOSIS — N186 End stage renal disease: Secondary | ICD-10-CM | POA: Diagnosis not present

## 2018-08-27 DIAGNOSIS — N2581 Secondary hyperparathyroidism of renal origin: Secondary | ICD-10-CM | POA: Diagnosis not present

## 2018-08-27 DIAGNOSIS — D509 Iron deficiency anemia, unspecified: Secondary | ICD-10-CM | POA: Diagnosis not present

## 2018-08-27 DIAGNOSIS — D631 Anemia in chronic kidney disease: Secondary | ICD-10-CM | POA: Diagnosis not present

## 2018-08-29 DIAGNOSIS — D631 Anemia in chronic kidney disease: Secondary | ICD-10-CM | POA: Diagnosis not present

## 2018-08-29 DIAGNOSIS — D509 Iron deficiency anemia, unspecified: Secondary | ICD-10-CM | POA: Diagnosis not present

## 2018-08-29 DIAGNOSIS — N2581 Secondary hyperparathyroidism of renal origin: Secondary | ICD-10-CM | POA: Diagnosis not present

## 2018-08-29 DIAGNOSIS — N186 End stage renal disease: Secondary | ICD-10-CM | POA: Diagnosis not present

## 2018-09-01 DIAGNOSIS — N186 End stage renal disease: Secondary | ICD-10-CM | POA: Diagnosis not present

## 2018-09-01 DIAGNOSIS — D631 Anemia in chronic kidney disease: Secondary | ICD-10-CM | POA: Diagnosis not present

## 2018-09-01 DIAGNOSIS — D509 Iron deficiency anemia, unspecified: Secondary | ICD-10-CM | POA: Diagnosis not present

## 2018-09-01 DIAGNOSIS — N2581 Secondary hyperparathyroidism of renal origin: Secondary | ICD-10-CM | POA: Diagnosis not present

## 2018-09-02 ENCOUNTER — Other Ambulatory Visit: Payer: Self-pay | Admitting: Gastroenterology

## 2018-09-02 DIAGNOSIS — Z992 Dependence on renal dialysis: Secondary | ICD-10-CM | POA: Diagnosis not present

## 2018-09-02 DIAGNOSIS — R7989 Other specified abnormal findings of blood chemistry: Secondary | ICD-10-CM | POA: Diagnosis not present

## 2018-09-02 DIAGNOSIS — I864 Gastric varices: Secondary | ICD-10-CM | POA: Diagnosis not present

## 2018-09-02 DIAGNOSIS — I8500 Esophageal varices without bleeding: Secondary | ICD-10-CM | POA: Diagnosis not present

## 2018-09-02 DIAGNOSIS — K7460 Unspecified cirrhosis of liver: Secondary | ICD-10-CM | POA: Diagnosis not present

## 2018-09-02 DIAGNOSIS — M310 Hypersensitivity angiitis: Secondary | ICD-10-CM | POA: Diagnosis not present

## 2018-09-02 DIAGNOSIS — N186 End stage renal disease: Secondary | ICD-10-CM | POA: Diagnosis not present

## 2018-09-02 DIAGNOSIS — D638 Anemia in other chronic diseases classified elsewhere: Secondary | ICD-10-CM | POA: Diagnosis not present

## 2018-09-03 DIAGNOSIS — N2581 Secondary hyperparathyroidism of renal origin: Secondary | ICD-10-CM | POA: Diagnosis not present

## 2018-09-03 DIAGNOSIS — D509 Iron deficiency anemia, unspecified: Secondary | ICD-10-CM | POA: Diagnosis not present

## 2018-09-03 DIAGNOSIS — D631 Anemia in chronic kidney disease: Secondary | ICD-10-CM | POA: Diagnosis not present

## 2018-09-03 DIAGNOSIS — N186 End stage renal disease: Secondary | ICD-10-CM | POA: Diagnosis not present

## 2018-09-05 DIAGNOSIS — E1122 Type 2 diabetes mellitus with diabetic chronic kidney disease: Secondary | ICD-10-CM | POA: Diagnosis not present

## 2018-09-05 DIAGNOSIS — N186 End stage renal disease: Secondary | ICD-10-CM | POA: Diagnosis not present

## 2018-09-05 DIAGNOSIS — D631 Anemia in chronic kidney disease: Secondary | ICD-10-CM | POA: Diagnosis not present

## 2018-09-05 DIAGNOSIS — N2581 Secondary hyperparathyroidism of renal origin: Secondary | ICD-10-CM | POA: Diagnosis not present

## 2018-09-05 DIAGNOSIS — Z992 Dependence on renal dialysis: Secondary | ICD-10-CM | POA: Diagnosis not present

## 2018-09-05 DIAGNOSIS — D509 Iron deficiency anemia, unspecified: Secondary | ICD-10-CM | POA: Diagnosis not present

## 2018-09-07 ENCOUNTER — Ambulatory Visit
Admission: RE | Admit: 2018-09-07 | Discharge: 2018-09-07 | Disposition: A | Discharge status: Home / Self Care | Payer: Medicare Other | Source: Ambulatory Visit | Attending: Gastroenterology | Admitting: Gastroenterology

## 2018-09-07 DIAGNOSIS — K7460 Unspecified cirrhosis of liver: Secondary | ICD-10-CM | POA: Diagnosis not present

## 2018-09-08 DIAGNOSIS — N2581 Secondary hyperparathyroidism of renal origin: Secondary | ICD-10-CM | POA: Diagnosis not present

## 2018-09-08 DIAGNOSIS — N186 End stage renal disease: Secondary | ICD-10-CM | POA: Diagnosis not present

## 2018-09-08 DIAGNOSIS — D631 Anemia in chronic kidney disease: Secondary | ICD-10-CM | POA: Diagnosis not present

## 2018-09-08 DIAGNOSIS — D509 Iron deficiency anemia, unspecified: Secondary | ICD-10-CM | POA: Diagnosis not present

## 2018-09-10 ENCOUNTER — Other Ambulatory Visit: Payer: Self-pay | Admitting: Gastroenterology

## 2018-09-10 DIAGNOSIS — N186 End stage renal disease: Secondary | ICD-10-CM | POA: Diagnosis not present

## 2018-09-10 DIAGNOSIS — D509 Iron deficiency anemia, unspecified: Secondary | ICD-10-CM | POA: Diagnosis not present

## 2018-09-10 DIAGNOSIS — N2581 Secondary hyperparathyroidism of renal origin: Secondary | ICD-10-CM | POA: Diagnosis not present

## 2018-09-10 DIAGNOSIS — K7469 Other cirrhosis of liver: Secondary | ICD-10-CM

## 2018-09-10 DIAGNOSIS — R748 Abnormal levels of other serum enzymes: Secondary | ICD-10-CM

## 2018-09-10 DIAGNOSIS — D631 Anemia in chronic kidney disease: Secondary | ICD-10-CM | POA: Diagnosis not present

## 2018-09-12 DIAGNOSIS — N186 End stage renal disease: Secondary | ICD-10-CM | POA: Diagnosis not present

## 2018-09-12 DIAGNOSIS — N2581 Secondary hyperparathyroidism of renal origin: Secondary | ICD-10-CM | POA: Diagnosis not present

## 2018-09-12 DIAGNOSIS — D631 Anemia in chronic kidney disease: Secondary | ICD-10-CM | POA: Diagnosis not present

## 2018-09-12 DIAGNOSIS — D509 Iron deficiency anemia, unspecified: Secondary | ICD-10-CM | POA: Diagnosis not present

## 2018-09-15 DIAGNOSIS — D631 Anemia in chronic kidney disease: Secondary | ICD-10-CM | POA: Diagnosis not present

## 2018-09-15 DIAGNOSIS — N2581 Secondary hyperparathyroidism of renal origin: Secondary | ICD-10-CM | POA: Diagnosis not present

## 2018-09-15 DIAGNOSIS — N186 End stage renal disease: Secondary | ICD-10-CM | POA: Diagnosis not present

## 2018-09-15 DIAGNOSIS — D509 Iron deficiency anemia, unspecified: Secondary | ICD-10-CM | POA: Diagnosis not present

## 2018-09-17 DIAGNOSIS — D631 Anemia in chronic kidney disease: Secondary | ICD-10-CM | POA: Diagnosis not present

## 2018-09-17 DIAGNOSIS — N2581 Secondary hyperparathyroidism of renal origin: Secondary | ICD-10-CM | POA: Diagnosis not present

## 2018-09-17 DIAGNOSIS — N186 End stage renal disease: Secondary | ICD-10-CM | POA: Diagnosis not present

## 2018-09-17 DIAGNOSIS — D509 Iron deficiency anemia, unspecified: Secondary | ICD-10-CM | POA: Diagnosis not present

## 2018-09-19 DIAGNOSIS — N2581 Secondary hyperparathyroidism of renal origin: Secondary | ICD-10-CM | POA: Diagnosis not present

## 2018-09-19 DIAGNOSIS — D509 Iron deficiency anemia, unspecified: Secondary | ICD-10-CM | POA: Diagnosis not present

## 2018-09-19 DIAGNOSIS — N186 End stage renal disease: Secondary | ICD-10-CM | POA: Diagnosis not present

## 2018-09-19 DIAGNOSIS — D631 Anemia in chronic kidney disease: Secondary | ICD-10-CM | POA: Diagnosis not present

## 2018-09-21 DIAGNOSIS — N186 End stage renal disease: Secondary | ICD-10-CM | POA: Diagnosis not present

## 2018-09-21 DIAGNOSIS — D631 Anemia in chronic kidney disease: Secondary | ICD-10-CM | POA: Diagnosis not present

## 2018-09-21 DIAGNOSIS — N2581 Secondary hyperparathyroidism of renal origin: Secondary | ICD-10-CM | POA: Diagnosis not present

## 2018-09-21 DIAGNOSIS — D509 Iron deficiency anemia, unspecified: Secondary | ICD-10-CM | POA: Diagnosis not present

## 2018-09-22 DIAGNOSIS — Z794 Long term (current) use of insulin: Secondary | ICD-10-CM | POA: Diagnosis not present

## 2018-09-22 DIAGNOSIS — I10 Essential (primary) hypertension: Secondary | ICD-10-CM | POA: Diagnosis not present

## 2018-09-22 DIAGNOSIS — Z01810 Encounter for preprocedural cardiovascular examination: Secondary | ICD-10-CM | POA: Diagnosis not present

## 2018-09-22 DIAGNOSIS — I5030 Unspecified diastolic (congestive) heart failure: Secondary | ICD-10-CM | POA: Diagnosis not present

## 2018-09-22 DIAGNOSIS — I132 Hypertensive heart and chronic kidney disease with heart failure and with stage 5 chronic kidney disease, or end stage renal disease: Secondary | ICD-10-CM | POA: Diagnosis not present

## 2018-09-22 DIAGNOSIS — I509 Heart failure, unspecified: Secondary | ICD-10-CM | POA: Insufficient documentation

## 2018-09-22 DIAGNOSIS — E1122 Type 2 diabetes mellitus with diabetic chronic kidney disease: Secondary | ICD-10-CM | POA: Diagnosis not present

## 2018-09-22 DIAGNOSIS — N186 End stage renal disease: Secondary | ICD-10-CM | POA: Diagnosis not present

## 2018-09-22 DIAGNOSIS — Z01818 Encounter for other preprocedural examination: Secondary | ICD-10-CM | POA: Diagnosis not present

## 2018-09-22 DIAGNOSIS — E119 Type 2 diabetes mellitus without complications: Secondary | ICD-10-CM | POA: Diagnosis not present

## 2018-09-22 DIAGNOSIS — R942 Abnormal results of pulmonary function studies: Secondary | ICD-10-CM | POA: Diagnosis not present

## 2018-09-22 DIAGNOSIS — K7469 Other cirrhosis of liver: Secondary | ICD-10-CM | POA: Diagnosis not present

## 2018-09-22 DIAGNOSIS — Z992 Dependence on renal dialysis: Secondary | ICD-10-CM | POA: Diagnosis not present

## 2018-09-23 DIAGNOSIS — Z992 Dependence on renal dialysis: Secondary | ICD-10-CM | POA: Diagnosis not present

## 2018-09-23 DIAGNOSIS — D696 Thrombocytopenia, unspecified: Secondary | ICD-10-CM | POA: Diagnosis not present

## 2018-09-23 DIAGNOSIS — I864 Gastric varices: Secondary | ICD-10-CM | POA: Diagnosis not present

## 2018-09-23 DIAGNOSIS — I132 Hypertensive heart and chronic kidney disease with heart failure and with stage 5 chronic kidney disease, or end stage renal disease: Secondary | ICD-10-CM | POA: Diagnosis not present

## 2018-09-23 DIAGNOSIS — R188 Other ascites: Secondary | ICD-10-CM | POA: Diagnosis not present

## 2018-09-23 DIAGNOSIS — Z01818 Encounter for other preprocedural examination: Secondary | ICD-10-CM | POA: Insufficient documentation

## 2018-09-23 DIAGNOSIS — Z794 Long term (current) use of insulin: Secondary | ICD-10-CM | POA: Diagnosis not present

## 2018-09-23 DIAGNOSIS — K7290 Hepatic failure, unspecified without coma: Secondary | ICD-10-CM | POA: Diagnosis not present

## 2018-09-23 DIAGNOSIS — K766 Portal hypertension: Secondary | ICD-10-CM | POA: Diagnosis not present

## 2018-09-23 DIAGNOSIS — N186 End stage renal disease: Secondary | ICD-10-CM | POA: Diagnosis not present

## 2018-09-23 DIAGNOSIS — E1122 Type 2 diabetes mellitus with diabetic chronic kidney disease: Secondary | ICD-10-CM | POA: Diagnosis not present

## 2018-09-23 DIAGNOSIS — K760 Fatty (change of) liver, not elsewhere classified: Secondary | ICD-10-CM | POA: Diagnosis not present

## 2018-09-23 DIAGNOSIS — K7460 Unspecified cirrhosis of liver: Secondary | ICD-10-CM | POA: Diagnosis not present

## 2018-09-23 DIAGNOSIS — I8500 Esophageal varices without bleeding: Secondary | ICD-10-CM | POA: Insufficient documentation

## 2018-09-23 DIAGNOSIS — I5030 Unspecified diastolic (congestive) heart failure: Secondary | ICD-10-CM | POA: Diagnosis not present

## 2018-09-24 ENCOUNTER — Telehealth: Payer: Self-pay | Admitting: Emergency Medicine

## 2018-09-24 DIAGNOSIS — R918 Other nonspecific abnormal finding of lung field: Secondary | ICD-10-CM | POA: Diagnosis not present

## 2018-09-24 DIAGNOSIS — R161 Splenomegaly, not elsewhere classified: Secondary | ICD-10-CM | POA: Diagnosis not present

## 2018-09-24 DIAGNOSIS — Z992 Dependence on renal dialysis: Secondary | ICD-10-CM | POA: Diagnosis not present

## 2018-09-24 DIAGNOSIS — I10 Essential (primary) hypertension: Secondary | ICD-10-CM | POA: Diagnosis not present

## 2018-09-24 DIAGNOSIS — I864 Gastric varices: Secondary | ICD-10-CM | POA: Diagnosis not present

## 2018-09-24 DIAGNOSIS — R760 Raised antibody titer: Secondary | ICD-10-CM | POA: Diagnosis not present

## 2018-09-24 DIAGNOSIS — N186 End stage renal disease: Secondary | ICD-10-CM | POA: Diagnosis not present

## 2018-09-24 DIAGNOSIS — K766 Portal hypertension: Secondary | ICD-10-CM | POA: Diagnosis not present

## 2018-09-24 DIAGNOSIS — K7581 Nonalcoholic steatohepatitis (NASH): Secondary | ICD-10-CM | POA: Diagnosis not present

## 2018-09-24 DIAGNOSIS — Z794 Long term (current) use of insulin: Secondary | ICD-10-CM | POA: Diagnosis not present

## 2018-09-24 DIAGNOSIS — K7460 Unspecified cirrhosis of liver: Secondary | ICD-10-CM | POA: Diagnosis not present

## 2018-09-24 DIAGNOSIS — E1122 Type 2 diabetes mellitus with diabetic chronic kidney disease: Secondary | ICD-10-CM | POA: Diagnosis not present

## 2018-09-24 DIAGNOSIS — I120 Hypertensive chronic kidney disease with stage 5 chronic kidney disease or end stage renal disease: Secondary | ICD-10-CM | POA: Diagnosis not present

## 2018-09-24 DIAGNOSIS — R05 Cough: Secondary | ICD-10-CM | POA: Diagnosis not present

## 2018-09-24 DIAGNOSIS — I5030 Unspecified diastolic (congestive) heart failure: Secondary | ICD-10-CM | POA: Diagnosis not present

## 2018-09-24 DIAGNOSIS — Z01818 Encounter for other preprocedural examination: Secondary | ICD-10-CM | POA: Diagnosis not present

## 2018-09-24 DIAGNOSIS — E119 Type 2 diabetes mellitus without complications: Secondary | ICD-10-CM | POA: Diagnosis not present

## 2018-09-24 NOTE — Telephone Encounter (Signed)
Copied from [place] [id number]. Topic: General - Other >> Sep 24, 2018 10:30 AM Virl Axe D wrote: Reason for CRM: Cyril Mourning with [facility] called to speak with Dr. Barry Brunner assitant. There was no answer. Please advise. WI#[phone]

## 2018-09-25 DIAGNOSIS — D509 Iron deficiency anemia, unspecified: Secondary | ICD-10-CM | POA: Diagnosis not present

## 2018-09-25 DIAGNOSIS — N186 End stage renal disease: Secondary | ICD-10-CM | POA: Diagnosis not present

## 2018-09-25 DIAGNOSIS — N2581 Secondary hyperparathyroidism of renal origin: Secondary | ICD-10-CM | POA: Diagnosis not present

## 2018-09-25 DIAGNOSIS — D631 Anemia in chronic kidney disease: Secondary | ICD-10-CM | POA: Diagnosis not present

## 2018-09-26 DIAGNOSIS — D631 Anemia in chronic kidney disease: Secondary | ICD-10-CM | POA: Diagnosis not present

## 2018-09-26 DIAGNOSIS — N186 End stage renal disease: Secondary | ICD-10-CM | POA: Diagnosis not present

## 2018-09-26 DIAGNOSIS — D509 Iron deficiency anemia, unspecified: Secondary | ICD-10-CM | POA: Diagnosis not present

## 2018-09-26 DIAGNOSIS — N2581 Secondary hyperparathyroidism of renal origin: Secondary | ICD-10-CM | POA: Diagnosis not present

## 2018-09-28 NOTE — Telephone Encounter (Signed)
Left message on Kristen's voicemail to ask for Caren Griffins or Mariann Laster to follow up on message.

## 2018-09-29 ENCOUNTER — Other Ambulatory Visit: Payer: Medicare Other

## 2018-09-29 DIAGNOSIS — N2581 Secondary hyperparathyroidism of renal origin: Secondary | ICD-10-CM | POA: Diagnosis not present

## 2018-09-29 DIAGNOSIS — D509 Iron deficiency anemia, unspecified: Secondary | ICD-10-CM | POA: Diagnosis not present

## 2018-09-29 DIAGNOSIS — N186 End stage renal disease: Secondary | ICD-10-CM | POA: Diagnosis not present

## 2018-09-29 DIAGNOSIS — D631 Anemia in chronic kidney disease: Secondary | ICD-10-CM | POA: Diagnosis not present

## 2018-09-29 NOTE — Progress Notes (Signed)
Arabic interpreter scheduled for tomorrow with interpretive services. [phone] from [phone]

## 2018-09-30 ENCOUNTER — Ambulatory Visit (HOSPITAL_COMMUNITY)
Admission: RE | Admit: 2018-09-30 | Discharge: 2018-09-30 | Disposition: A | Discharge status: Home / Self Care | Payer: Medicare Other | Attending: Gastroenterology | Admitting: Gastroenterology

## 2018-09-30 ENCOUNTER — Ambulatory Visit (HOSPITAL_COMMUNITY): Payer: Medicare Other

## 2018-09-30 ENCOUNTER — Other Ambulatory Visit: Payer: Self-pay

## 2018-09-30 ENCOUNTER — Encounter (HOSPITAL_COMMUNITY): Payer: Self-pay | Admitting: *Deleted

## 2018-09-30 ENCOUNTER — Encounter (HOSPITAL_COMMUNITY)
Admission: RE | Disposition: A | Discharge status: Home / Self Care | Payer: Self-pay | Source: Home / Self Care | Attending: Gastroenterology

## 2018-09-30 ENCOUNTER — Other Ambulatory Visit: Payer: Self-pay | Admitting: Emergency Medicine

## 2018-09-30 DIAGNOSIS — K7460 Unspecified cirrhosis of liver: Secondary | ICD-10-CM | POA: Diagnosis not present

## 2018-09-30 DIAGNOSIS — Z992 Dependence on renal dialysis: Secondary | ICD-10-CM | POA: Insufficient documentation

## 2018-09-30 DIAGNOSIS — Z794 Long term (current) use of insulin: Secondary | ICD-10-CM | POA: Insufficient documentation

## 2018-09-30 DIAGNOSIS — E1122 Type 2 diabetes mellitus with diabetic chronic kidney disease: Secondary | ICD-10-CM | POA: Insufficient documentation

## 2018-09-30 DIAGNOSIS — I8510 Secondary esophageal varices without bleeding: Secondary | ICD-10-CM | POA: Diagnosis not present

## 2018-09-30 DIAGNOSIS — I509 Heart failure, unspecified: Secondary | ICD-10-CM | POA: Insufficient documentation

## 2018-09-30 DIAGNOSIS — N186 End stage renal disease: Secondary | ICD-10-CM | POA: Insufficient documentation

## 2018-09-30 DIAGNOSIS — M310 Hypersensitivity angiitis: Secondary | ICD-10-CM | POA: Insufficient documentation

## 2018-09-30 DIAGNOSIS — I8500 Esophageal varices without bleeding: Secondary | ICD-10-CM | POA: Diagnosis not present

## 2018-09-30 DIAGNOSIS — R972 Elevated prostate specific antigen [PSA]: Secondary | ICD-10-CM

## 2018-09-30 DIAGNOSIS — R188 Other ascites: Secondary | ICD-10-CM

## 2018-09-30 DIAGNOSIS — I864 Gastric varices: Secondary | ICD-10-CM | POA: Insufficient documentation

## 2018-09-30 DIAGNOSIS — I132 Hypertensive heart and chronic kidney disease with heart failure and with stage 5 chronic kidney disease, or end stage renal disease: Secondary | ICD-10-CM | POA: Diagnosis not present

## 2018-09-30 HISTORY — PX: ESOPHAGOGASTRODUODENOSCOPY (EGD) WITH PROPOFOL: SHX5813

## 2018-09-30 LAB — POCT I-STAT 4, (NA,K, GLUC, HGB,HCT)
Glucose, Bld: 226 mg/dL — ABNORMAL HIGH (ref 70–99)
HCT: 36 % — ABNORMAL LOW (ref 39.0–52.0)
Hemoglobin: 12.2 g/dL — ABNORMAL LOW (ref 13.0–17.0)
Potassium: 4.7 mmol/L (ref 3.5–5.1)
Sodium: 132 mmol/L — ABNORMAL LOW (ref 135–145)

## 2018-09-30 SURGERY — ESOPHAGOGASTRODUODENOSCOPY (EGD) WITH PROPOFOL
Anesthesia: Monitor Anesthesia Care

## 2018-09-30 MED ORDER — PROPOFOL 10 MG/ML IV BOLUS
INTRAVENOUS | Status: AC
  Filled 2018-09-30: qty 40

## 2018-09-30 MED ORDER — SODIUM CHLORIDE 0.9 % IV SOLN
INTRAVENOUS | Status: DC | PRN
  Administered 2018-09-30: 12:00:00 via INTRAVENOUS

## 2018-09-30 MED ORDER — PROPOFOL 500 MG/50ML IV EMUL
INTRAVENOUS | Status: DC | PRN
  Administered 2018-09-30: 100 ug/kg/min via INTRAVENOUS

## 2018-09-30 MED ORDER — PROPOFOL 10 MG/ML IV BOLUS
INTRAVENOUS | Status: DC | PRN
  Administered 2018-09-30: 20 mg via INTRAVENOUS

## 2018-09-30 MED ORDER — LIDOCAINE 2% (20 MG/ML) 5 ML SYRINGE
INTRAMUSCULAR | Status: DC | PRN
  Administered 2018-09-30: 50 mg via INTRAVENOUS

## 2018-09-30 SURGICAL SUPPLY — 15 items

## 2018-09-30 NOTE — Brief Op Note (Signed)
09/30/2018  1:24 PM  PATIENT:  William [name]  68 y.o. male  PRE-OPERATIVE DIAGNOSIS:  esophageal varices  POST-OPERATIVE DIAGNOSIS:  gastric varices / small esophageal varices  PROCEDURE:  Procedure(s): ESOPHAGOGASTRODUODENOSCOPY (EGD) WITH PROPOFOL (N/A)  SURGEON:  Surgeon(s) and Role:    * Safina Huard, MD - Primary  Findings --------- -Small esophageal varices, retained food in the stomach as well as large gastric varices.   Recommendations ------------------------- -Patient's esophageal varices are almost eradicated but he continues to have a large gastric varices.  -  Interventional radiology consult for possible B RTO and/or TIPS discussed with the patient's family.  They would like to hold off on interventional radiology evaluation as patient is currently undergoing liver and kidney transplant evaluation.  -Recommend repeat EGD in 6 months. -Continue previous medications. - follow up in GI clinic as previously scheduled.  Otis Brace MD, [place] 09/30/2018, 1:26 PM  Contact #  [phone]

## 2018-09-30 NOTE — Discharge Instructions (Signed)
YOU HAD AN ENDOSCOPIC PROCEDURE TODAY: Refer to the procedure report and other information in the discharge instructions given to you for any specific questions about what was found during the examination. If this information does not answer your questions, please call [facility] office at [phone] to clarify.   YOU SHOULD EXPECT: Some feelings of bloating in the abdomen. Passage of more gas than usual. Walking can help get rid of the air that was put into your GI tract during the procedure and reduce the bloating. If you had a lower endoscopy (such as a colonoscopy or flexible sigmoidoscopy) you may notice spotting of blood in your stool or on the toilet paper. Some abdominal soreness may be present for a day or two, also.  DIET: Your first meal following the procedure should be a light meal and then it is ok to progress to your normal diet. A half-sandwich or bowl of soup is an example of a good first meal. Heavy or fried foods are harder to digest and may make you feel nauseous or bloated. Drink plenty of fluids but you should avoid alcoholic beverages for 24 hours. If you had a esophageal dilation, please see attached instructions for diet.   ACTIVITY: Your care partner should take you home directly after the procedure. You should plan to take it easy, moving slowly for the rest of the day. You can resume normal activity the day after the procedure however YOU SHOULD NOT DRIVE, use power tools, machinery or perform tasks that involve climbing or major physical exertion for 24 hours (because of the sedation medicines used during the test).   SYMPTOMS TO REPORT IMMEDIATELY: A gastroenterologist can be reached at any hour. Please call [phone]  for any of the following symptoms:    Following upper endoscopy (EGD, EUS, ERCP, esophageal dilation) Vomiting of blood or coffee ground material  New, significant abdominal pain  New, significant chest pain or pain under the shoulder blades  Painful or  persistently difficult swallowing  New shortness of breath  Black, tarry-looking or red, bloody stools  FOLLOW UP:  If any biopsies were taken you will be contacted by phone or by letter within the next 1-3 weeks. Call [phone]  if you have not heard about the biopsies in 3 weeks.  Please also call with any specific questions about appointments or follow up tests.

## 2018-09-30 NOTE — Anesthesia Preprocedure Evaluation (Signed)
Anesthesia Evaluation  Patient identified by MRN, date of birth, ID band Patient awake    Reviewed: Allergy & Precautions, H&P , NPO status , Patient's Chart, lab work & pertinent test results  Airway Mallampati: II   Neck ROM: full    Dental   Pulmonary neg pulmonary ROS,    breath sounds clear to auscultation       Cardiovascular hypertension, +CHF   Rhythm:regular Rate:Normal     Neuro/Psych    GI/Hepatic GERD  ,  Endo/Other  diabetes, Type 2  Renal/GU ESRF and DialysisRenal disease     Musculoskeletal   Abdominal   Peds  Hematology  (+) Blood dyscrasia, anemia ,   Anesthesia Other Findings   Reproductive/Obstetrics                             Anesthesia Physical Anesthesia Plan  ASA: III  Anesthesia Plan: MAC   Post-op Pain Management:    Induction: Intravenous  PONV Risk Score and Plan: 1 and Propofol infusion, Treatment may vary due to age or medical condition and Ondansetron  Airway Management Planned: Simple Face Mask  Additional Equipment:   Intra-op Plan:   Post-operative Plan:   Informed Consent: I have reviewed the patients History and Physical, chart, labs and discussed the procedure including the risks, benefits and alternatives for the proposed anesthesia with the patient or authorized representative who has indicated his/her understanding and acceptance.       Plan Discussed with: CRNA, Anesthesiologist and Surgeon  Anesthesia Plan Comments:         Anesthesia Quick Evaluation

## 2018-09-30 NOTE — H&P (Signed)
William [name] is a 68 y.o. male with past medical history of cirrhosis complicated by esophageal varices.  Underwent band ligation in the past.  Here for further band ligation of his esophageal varices.  Being evaluated for kidney and liver transplant at [facility].  Please see office notes for full H&P.   The various methods of treatment have been discussed with the patient and family. After consideration of risks, benefits and other options for treatment, the patient has consented to  Procedure(s): Esophago-duodenoscopy with possible band ligation as a surgical intervention .  The patient's history has been reviewed, patient examined, no change in status, stable for surgery.  I have reviewed the patient's chart and labs.  Questions were answered to the patient's satisfaction.   Risks (bleeding, infection, bowel perforation that could require surgery, sedation-related changes in cardiopulmonary systems), benefits (identification and possible treatment of source of symptoms, exclusion of certain causes of symptoms), and alternatives (watchful waiting, radiographic imaging studies, empiric medical treatment)  were explained to patient/family in detail and patient wishes to proceed.  Otis Brace MD, [place] 09/30/2018, 12:13 PM  Contact #  [phone]

## 2018-09-30 NOTE — Op Note (Signed)
[facility] Patient Name: William [name] Procedure Date: 09/30/2018 MRN: [id number] Attending MD: Otis Brace , MD Date of Birth: 12-26-1950 CSN: [id number] Age: 68 Admit Type: Outpatient Procedure:                Upper GI endoscopy Indications:              Follow-up of esophageal varices, For therapy of                            esophageal varices Providers:                Otis Brace, MD, Cleda Daub, RN, Tinnie Gens, Technician, Anne Fu CRNA, CRNA Referring MD:              Medicines:                Sedation Administered by an Anesthesia Professional Complications:            No immediate complications. Estimated Blood Loss:     Estimated blood loss was minimal. Procedure:                Pre-Anesthesia Assessment:                           - Prior to the procedure, a History and Physical                            was performed, and patient medications and                            allergies were reviewed. The patient's tolerance of                            previous anesthesia was also reviewed. The risks                            and benefits of the procedure and the sedation                            options and risks were discussed with the patient.                            All questions were answered, and informed consent                            was obtained. Prior Anticoagulants: The patient has                            taken no previous anticoagulant or antiplatelet                            agents. ASA Grade Assessment: III - A patient with  severe systemic disease. After reviewing the risks                            and benefits, the patient was deemed in                            satisfactory condition to undergo the procedure.                           After obtaining informed consent, the endoscope was                            passed under direct vision.  Throughout the                            procedure, the patient's blood pressure, pulse, and                            oxygen saturations were monitored continuously. The                            GIF-H190 ([id number]) Olympus gastroscope was                            introduced through the mouth, and advanced to the                            second part of duodenum. The upper GI endoscopy was                            technically difficult and complex due to presence                            of food. The patient tolerated the procedure well. Scope In: Scope Out: Findings:      Non-bleeding small (< 5 mm) varices were found in the mid esophagus and       in the distal esophagus,. No stigmata of recent bleeding were evident       and no red wale signs were present. Stigmata of prior treatment were       evident. The varices appeared smaller than they were at prior exam.      The exam of the esophagus was otherwise normal.      A large amount of food (residue) was found in the gastric fundus and in       the gastric body.      Type 2 gastroesophageal varices (GOV2, esophageal varices which extend       along the fundus) with no bleeding were found in the gastric fundus.       They were large in largest diameter.      The ampulla, duodenal bulb, first portion of the duodenum and second       portion of the duodenum were normal. Impression:               - Non-bleeding small (< 5 mm) esophageal varices.                           -  A large amount of food (residue) in the stomach.                           - Type 2 gastroesophageal varices (GOV2, esophageal                            varices which extend along the fundus), without                            bleeding.                           - Normal ampulla, duodenal bulb, first portion of                            the duodenum and second portion of the duodenum.                           - No specimens collected. Moderate  Sedation:      Moderate (conscious) sedation was personally administered by an       anesthesia professional. The following parameters were monitored: oxygen       saturation, heart rate, blood pressure, and response to care. Recommendation:           - Patient has a contact number available for                            emergencies. The signs and symptoms of potential                            delayed complications were discussed with the                            patient. Return to normal activities tomorrow.                            Written discharge instructions were provided to the                            patient.                           - Resume previous diet.                           - Continue present medications.                           - Repeat upper endoscopy in 6 months for                            surveillance. Recommend interventional radiology                            consult for evaluation of possible BRTO and/or TIPS.                           -  Return to my office as previously scheduled. Procedure Code(s):        --- Professional ---                           [phone], Esophagogastroduodenoscopy, flexible,                            transoral; diagnostic, including collection of                            specimen(s) by brushing or washing, when performed                            (separate procedure) Diagnosis Code(s):        --- Professional ---                           I85.00, Esophageal varices without bleeding                           I86.4, Gastric varices CPT copyright 2018 American Medical Association. All rights reserved. The codes documented in this report are preliminary and upon coder review may  be revised to meet current compliance requirements. Otis Brace, MD Otis Brace, MD 09/30/2018 12:40:58 PM Number of Addenda: 0

## 2018-09-30 NOTE — Transfer of Care (Signed)
Immediate Anesthesia Transfer of Care Note  Patient: Dawaun Haan  Procedure(s) Performed: Procedure(s): ESOPHAGOGASTRODUODENOSCOPY (EGD) WITH PROPOFOL (N/A)  Patient Location: PACU  Anesthesia Type:MAC  Level of Consciousness:  sedated, patient cooperative and responds to stimulation  Airway & Oxygen Therapy:Patient Spontanous Breathing and Patient connected to face mask oxgen  Post-op Assessment:  Report given to PACU RN and Post -op Vital signs reviewed and stable  Post vital signs:  Reviewed and stable  Last Vitals:  Vitals:   09/30/18 1125 09/30/18 1231  BP: (!) 195/73 (!) 107/35  Pulse: 81 68  Resp: 12 16  Temp: 36.4 C   SpO2: 38% 46%    Complications: No apparent anesthesia complications

## 2018-10-01 ENCOUNTER — Encounter (HOSPITAL_COMMUNITY): Payer: Self-pay | Admitting: Gastroenterology

## 2018-10-01 DIAGNOSIS — D631 Anemia in chronic kidney disease: Secondary | ICD-10-CM | POA: Diagnosis not present

## 2018-10-01 DIAGNOSIS — N2581 Secondary hyperparathyroidism of renal origin: Secondary | ICD-10-CM | POA: Diagnosis not present

## 2018-10-01 DIAGNOSIS — D509 Iron deficiency anemia, unspecified: Secondary | ICD-10-CM | POA: Diagnosis not present

## 2018-10-01 DIAGNOSIS — N186 End stage renal disease: Secondary | ICD-10-CM | POA: Diagnosis not present

## 2018-10-02 NOTE — Anesthesia Postprocedure Evaluation (Signed)
Anesthesia Post Note  Patient: William [name]  Procedure(s) Performed: ESOPHAGOGASTRODUODENOSCOPY (EGD) WITH PROPOFOL (N/A )     Patient location during evaluation: Endoscopy Anesthesia Type: MAC Level of consciousness: awake and alert Pain management: pain level controlled Vital Signs Assessment: post-procedure vital signs reviewed and stable Respiratory status: spontaneous breathing, nonlabored ventilation, respiratory function stable and patient connected to nasal cannula oxygen Cardiovascular status: blood pressure returned to baseline and stable Postop Assessment: no apparent nausea or vomiting Anesthetic complications: no    Last Vitals:  Vitals:   09/30/18 1240 09/30/18 1245  BP: (!) 118/43 125/60  Pulse: 73 75  Resp: 18 15  Temp:    SpO2: 92% 96%    Last Pain:  Vitals:   10/01/18 1612  TempSrc:   PainSc: 0-No pain                 Korra Christine S

## 2018-10-03 DIAGNOSIS — N186 End stage renal disease: Secondary | ICD-10-CM | POA: Diagnosis not present

## 2018-10-03 DIAGNOSIS — N2581 Secondary hyperparathyroidism of renal origin: Secondary | ICD-10-CM | POA: Diagnosis not present

## 2018-10-03 DIAGNOSIS — D509 Iron deficiency anemia, unspecified: Secondary | ICD-10-CM | POA: Diagnosis not present

## 2018-10-03 DIAGNOSIS — D631 Anemia in chronic kidney disease: Secondary | ICD-10-CM | POA: Diagnosis not present

## 2018-10-04 DIAGNOSIS — Z992 Dependence on renal dialysis: Secondary | ICD-10-CM | POA: Diagnosis not present

## 2018-10-04 DIAGNOSIS — E1122 Type 2 diabetes mellitus with diabetic chronic kidney disease: Secondary | ICD-10-CM | POA: Diagnosis not present

## 2018-10-04 DIAGNOSIS — N186 End stage renal disease: Secondary | ICD-10-CM | POA: Diagnosis not present

## 2018-10-05 ENCOUNTER — Telehealth: Payer: Self-pay | Admitting: *Deleted

## 2018-10-05 NOTE — Telephone Encounter (Signed)
Entered in error

## 2018-10-05 NOTE — Telephone Encounter (Signed)
Spoke to William [name] at [facility] because the patient was referred there. I spoke to her on 10/02/2018, because she wanted to know the reason the referral was sent to the office and if a PSA was done. I advised her [facility] did the test during a routine workup and the PSA was elevated. [facility] requested the patient be referred to a urology in [place]. I will fax the PSA results to [facility] at [facility] today.

## 2018-10-06 ENCOUNTER — Other Ambulatory Visit: Payer: Self-pay

## 2018-10-06 ENCOUNTER — Ambulatory Visit (INDEPENDENT_AMBULATORY_CARE_PROVIDER_SITE_OTHER): Payer: Medicare Other | Admitting: Endocrinology

## 2018-10-06 ENCOUNTER — Encounter: Payer: Self-pay | Admitting: Endocrinology

## 2018-10-06 VITALS — BP 140/70 | HR 80 | Ht 64.0 in | Wt 142.0 lb

## 2018-10-06 DIAGNOSIS — E1165 Type 2 diabetes mellitus with hyperglycemia: Secondary | ICD-10-CM

## 2018-10-06 DIAGNOSIS — S90425S Blister (nonthermal), left lesser toe(s), sequela: Secondary | ICD-10-CM | POA: Diagnosis not present

## 2018-10-06 DIAGNOSIS — N2581 Secondary hyperparathyroidism of renal origin: Secondary | ICD-10-CM | POA: Diagnosis not present

## 2018-10-06 DIAGNOSIS — Z794 Long term (current) use of insulin: Secondary | ICD-10-CM | POA: Diagnosis not present

## 2018-10-06 DIAGNOSIS — D631 Anemia in chronic kidney disease: Secondary | ICD-10-CM | POA: Diagnosis not present

## 2018-10-06 DIAGNOSIS — D509 Iron deficiency anemia, unspecified: Secondary | ICD-10-CM | POA: Diagnosis not present

## 2018-10-06 DIAGNOSIS — N186 End stage renal disease: Secondary | ICD-10-CM | POA: Diagnosis not present

## 2018-10-06 LAB — POCT GLYCOSYLATED HEMOGLOBIN (HGB A1C): Hemoglobin A1C: 7.6 % — AB (ref 4.0–5.6)

## 2018-10-06 NOTE — Progress Notes (Signed)
Patient ID: William [name], male   DOB: [birth year], 68 y.o.   MRN: [id number]          Reason for Appointment: Follow-up for Type 2 Diabetes  Referring physician: Dr. Carole Civil   History of Present Illness:          Date of diagnosis of type 2 diabetes mellitus: 1990s       Background history:   He has been on insulin for about 15 years, previously taking unknown oral agents Has been usually taking insulin with a syringe and only one type of premixed insulin Previous A1c has ranged between 6.6 and 9.2 in the last 2 years that records are available  Recent history:   Most recent A1c is 7.7, previously was 7% done in 8/19  INSULIN regimen is:  Toujeo 14 units in a.m. Novolin R at mealtimes 6-8-8  He has his dialysis 3 times a week at 11 AM  Current management, blood sugar patterns and problems identified:  He still has very labile blood sugars  Also his blood sugars are not consistent at any given time  As before he is not checking readings after meals and mostly right before eating  He has been told to add a protein to each meal and not clear if he is doing this especially at dinnertime when he frequently has grits and fruit sometimes an egg or fish  Yesterday forgot his insulin at lunchtime causing his blood sugar to be 382 later  Otherwise his blood sugars were much better than usual yesterday and fasting was 95 today  His son does not think that he is forgetting his Toujeo  Also because of communication difficulty is not clear if his compliance with diet, snacks and insulin is consistent  Also relatively inactive lately because of winter months  No hypoglycemia         Side effects from medications have been: None known  Compliance with the medical regimen: Fair     Meal times are:  Breakfast is at 10-11 AM lunch: 2.30 Dinner: 10-11 PM                                                                                                                       Typical  meal intake: Breakfast is  an egg, bread and some fruit.  His daughter-in-law is usually doing the cooking        Dinner grits, fruit       Exercise:  Walking 15-30 minutes about every other day in the late afternoon  Glucose monitoring:  done about 3 times a day         Glucometer: brand.     One Touch Verio    Blood Glucose readings by download of monitor   PRE-MEAL Fasting Lunch Dinner Bedtime Overall  Glucose range:  95-337   102-601    Mean/median:  185  198  273   220+/-124   POST-MEAL PC Breakfast PC Lunch PC Dinner  Glucose  range:     Mean/median:      PREVIOUS readings:  PRE-MEAL Fasting Lunch Dinner Bedtime Overall  Glucose range:  110-260  47-357  75-601    Mean/median:  183  156  203+/-109   193+/-113   POST-MEAL PC Breakfast PC Lunch PC Dinner  Glucose range:    297-440  Mean/median:    332     CDE visit: 05/2018 Dietician visit, most recent: Never except some interaction in the hospital  Weight history:  Wt Readings from Last 3 Encounters:  10/06/18 142 lb (64.4 kg)  09/30/18 141 lb 1.5 oz (64 kg)  08/24/18 142 lb 6.4 oz (64.6 kg)    Glycemic control:   Lab Results  Component Value Date   HGBA1C 7.7 (H) 07/09/2018   HGBA1C 7.0 (H) 03/09/2018   HGBA1C 6.6 (H) 02/18/2018   Lab Results  Component Value Date   MICROALBUR 91.4 08/25/2015   LDLCALC 93 05/21/2018   CREATININE 4.04 ([place]) 03/09/2018   No results found for: MICRALBCREAT  Lab Results  Component Value Date   FRUCTOSAMINE 524 (H) 05/21/2018    Admission on 09/30/2018, Discharged on 09/30/2018  Component Date Value Ref Range Status  . Sodium 09/30/2018 132* 135 - 145 mmol/L Final  . Potassium 09/30/2018 4.7  3.5 - 5.1 mmol/L Final  . Glucose, Bld 09/30/2018 226* 70 - 99 mg/dL Final  . HCT 09/30/2018 36.0* 39.0 - 52.0 % Final  . Hemoglobin 09/30/2018 12.2* 13.0 - 17.0 g/dL Final    Allergies as of 10/06/2018      Reactions   Heparin Other (See Comments)   Patient is Muslim and is  not permitted Pork derative due to religious belief)   Pork-derived Products Other (See Comments)   Patient does not eat pork due to religious beliefs      Medication List       Accurate as of October 06, 2018  8:28 AM. Always use your most recent med list.        acetaminophen 650 MG CR tablet Commonly known as:  TYLENOL Take 650 mg by mouth every 8 (eight) hours as needed for pain.   amLODipine 10 MG tablet Commonly known as:  NORVASC Take 1 tablet (10 mg total) by mouth daily.   calcium acetate 667 MG capsule Commonly known as:  PHOSLO Take 2 capsules (1,334 mg total) by mouth 3 (three) times daily with meals.   carvedilol 3.125 MG tablet Commonly known as:  COREG Take 3.125 mg by mouth 2 (two) times daily.   glucose blood test strip Commonly known as:  ONETOUCH VERIO Use to check blood sugar 3 times a day dx e11.65   Insulin Glargine 300 UNIT/ML Sopn Commonly known as:  TOUJEO MAX SOLOSTAR Inject 12 Units into the skin daily.   Insulin Pen Needle 31G X 5 MM Misc Commonly known as:  ADVOCATE INSULIN PEN NEEDLES Use four times daily to inject insulin   insulin regular 100 units/mL injection Commonly known as:  NOVOLIN R,HUMULIN R Inject 6-8 Units into the skin See admin instructions. INJECT 6 UNITS UNDER THE SKIN AT BREAKFAST, AND 8 UNITS AT LUNCH AND DINNER.   IRON 27 240 (27 FE) MG tablet Generic drug:  ferrous gluconate Take 240 mg by mouth daily.   lidocaine-prilocaine cream Commonly known as:  EMLA Apply 1 application topically Every Tuesday,Thursday,and Saturday with dialysis.   multivitamin with minerals tablet Take 1 tablet by mouth daily.   ONE TOUCH LANCETS Misc Use to  test blood sugar three times daily   SYSTANE 0.4-0.3 % Soln Generic drug:  Polyethyl Glycol-Propyl Glycol Place 1 drop into both eyes 3 (three) times daily as needed (for dry eyes).       Allergies:  Allergies  Allergen Reactions  . Heparin Other (See Comments)    Patient is  Muslim and is not permitted Pork derative due to religious belief)  . Pork-Derived Products Other (See Comments)    Patient does not eat pork due to religious beliefs    Past Medical History:  Diagnosis Date  . Anemia   . CHF (congestive heart failure) ([place])   . Cirrhosis ([place])   . Cirrhosis ([place])   . CKD (chronic kidney disease), stage IV ([place])   . Goodpasture's disease [facility])    on outpatient plasmapheresis/notes 11/20/2017  . Grade I diastolic dysfunction 16/05/9603  . History of anemia due to chronic kidney disease   . History of blood transfusion X 1   "UGIB; low blood count"  . History of plasmapheresis    "qod" (11/20/2017)  . Hypertension   . Pancytopenia ([place]) 08/20/2017  . Type II diabetes mellitus ([place])     Past Surgical History:  Procedure Laterality Date  . AV FISTULA PLACEMENT Left 12/26/2017   Procedure: LEFT BRACHIOCEPHALIC ARTERIOVENOUS (AV) FISTULA CREATION;  Surgeon: Conrad [place], MD;  Location: [place];  Service: Vascular;  Laterality: Left;  . BASCILIC VEIN TRANSPOSITION Left 02/16/2018   Procedure: BRACHIOBASILIC VEIN TRANSPOSITION SECOND STAGE;  Surgeon: Conrad [place], MD;  Location: [place];  Service: Vascular;  Laterality: Left;  . CATARACT EXTRACTION W/ INTRAOCULAR LENS  IMPLANT, BILATERAL Bilateral   . ESOPHAGEAL BANDING N/A 04/01/2018   Procedure: ESOPHAGEAL BANDING;  Surgeon: Otis Brace, MD;  Location: [facility];  Service: Gastroenterology;  Laterality: N/A;  . ESOPHAGEAL BANDING N/A 06/29/2018   Procedure: ESOPHAGEAL BANDING;  Surgeon: Otis Brace, MD;  Location: [facility];  Service: Gastroenterology;  Laterality: N/A;  . ESOPHAGOGASTRODUODENOSCOPY (EGD) WITH PROPOFOL N/A 04/01/2018   Procedure: ESOPHAGOGASTRODUODENOSCOPY (EGD) WITH PROPOFOL;  Surgeon: Otis Brace, MD;  Location: [facility];  Service: Gastroenterology;  Laterality: N/A;  . ESOPHAGOGASTRODUODENOSCOPY (EGD) WITH PROPOFOL N/A 06/29/2018   Procedure:  ESOPHAGOGASTRODUODENOSCOPY (EGD) WITH PROPOFOL;  Surgeon: Otis Brace, MD;  Location: [facility];  Service: Gastroenterology;  Laterality: N/A;  . ESOPHAGOGASTRODUODENOSCOPY (EGD) WITH PROPOFOL N/A 09/30/2018   Procedure: ESOPHAGOGASTRODUODENOSCOPY (EGD) WITH PROPOFOL;  Surgeon: Otis Brace, MD;  Location: [facility];  Service: Gastroenterology;  Laterality: N/A;  . I&D EXTREMITY Left 02/18/2018   Procedure: IRRIGATION AND DEBRIDEMENT LEFT ARM;  Surgeon: Serafina Mitchell, MD;  Location: [place];  Service: Vascular;  Laterality: Left;  . INSERTION OF DIALYSIS CATHETER N/A 01/19/2018   Procedure: INSERTION OF DIALYSIS CATHETER - RIGHT INTERNAL JUGULAR PLACEMENT;  Surgeon: Rosetta Posner, MD;  Location: [place];  Service: Vascular;  Laterality: N/A;  . IR FLUORO GUIDE CV LINE RIGHT  11/10/2017  . IR PARACENTESIS  12/10/2017  . IR PARACENTESIS  12/26/2017  . IR REMOVAL TUN CV CATH W/O FL  11/28/2017  . IR US GUIDE VASC ACCESS RIGHT  11/10/2017  . NECK SURGERY  2007   "back of neck; no hardware in there"    Family History  Problem Relation Age of Onset  . Diabetes Mellitus II Sister   . Diabetes Sister   . Stroke Brother   . Heart attack Brother     Social History:  reports that he has never smoked. He has never  used smokeless tobacco. He reports that he does not drink alcohol or use drugs.   Review of Systems   Lipid history: Baseline LDL 136, also has family history of CAD, LDL is at goal with Crestor 5 mg    Lab Results  Component Value Date   CHOL 150 05/21/2018   HDL 44.80 05/21/2018   [place] 93 05/21/2018   TRIG 61.0 05/21/2018   CHOLHDL 3 05/21/2018           He reportedly has cirrhosis of the liver from unknown cause  Lab Results  Component Value Date   ALT 21 05/21/2018   ALT 16 01/07/2018   Hypertension: Has been present for several years managed by nephrologist  BP Readings from Last 3 Encounters:  10/06/18 140/70  09/30/18 125/60  08/24/18 (!) 190/100     Most recent eye exam was in 7/19  Most recent foot exam: 9/19  Currently known complications of diabetes: Nephropathy?,  Unknown status of retinopathy  He is asking about an open area on his left great toe for the last 3 weeks  On dialysis since about 5/19  LABS:  Admission on 09/30/2018, Discharged on 09/30/2018  Component Date Value Ref Range Status  . Sodium 09/30/2018 132* 135 - 145 mmol/L Final  . Potassium 09/30/2018 4.7  3.5 - 5.1 mmol/L Final  . Glucose, Bld 09/30/2018 226* 70 - 99 mg/dL Final  . HCT 09/30/2018 36.0* 39.0 - 52.0 % Final  . Hemoglobin 09/30/2018 12.2* 13.0 - 17.0 g/dL Final    Physical Examination:  BP 140/70 (BP Location: Right Arm, Patient Position: Sitting, Cuff Size: Normal)   Pulse 80   Ht 5' 4"  (1.626 m)   Wt 142 lb (64.4 kg)   SpO2 98%   BMI 24.37 kg/m   Distal left great toe has a remnant blister which is open and with dark edges no exudate, also has callused area surrounding this No significant redness or warmth to the toe Distal toes feel warm and no cyanosis no significant ankle edema     ASSESSMENT:  Diabetes type 2, insulin-dependent with poor control  See history of present illness for detailed discussion of current diabetes management, blood sugar patterns and problems identified  His A1c is 7.7 This is likely to be falsely low because of his renal failure  Although he has been instructed on taking his insulin consistently and having balanced meals his blood sugars are very labile Discussed above difficult to know whether this is related to compliance The last 1-2 days his blood sugars have been excellent except high when he forgot his lunchtime dose yesterday indicating that blood sugars do better with improved compliance Not doing readings after meals especially since he is eating dinner late at night Since fasting blood sugars are relatively stable and not consistently high will not increase Toujeo  He does need more  diabetes education and has not made appointment with dietitian  Left great toe noninfected blister and callus: He needs to be seen by podiatrist, currently does not appear to have cellulitis   PLAN:   Needs to take the Novolin R insulin consistently before each meal and try to take 30 minutes before eating  No change in Toujeo To have protein with every meal To check some readings after supper also Will refer to nutritionist for evaluation of his meal planning and with adjusting his insulin based on his meal sizes and carbohydrate intake  There are no Patient Instructions on file for this  visit.   Total visit time for evaluation and management of multiple problems and counseling =25 minutes  Elayne Snare 10/06/2018, 8:28 AM   Note: This office note was prepared with Dragon voice recognition system technology. Any transcriptional errors that result from this process are unintentional.

## 2018-10-08 DIAGNOSIS — N2581 Secondary hyperparathyroidism of renal origin: Secondary | ICD-10-CM | POA: Diagnosis not present

## 2018-10-08 DIAGNOSIS — D631 Anemia in chronic kidney disease: Secondary | ICD-10-CM | POA: Diagnosis not present

## 2018-10-08 DIAGNOSIS — D509 Iron deficiency anemia, unspecified: Secondary | ICD-10-CM | POA: Diagnosis not present

## 2018-10-08 DIAGNOSIS — N186 End stage renal disease: Secondary | ICD-10-CM | POA: Diagnosis not present

## 2018-10-10 DIAGNOSIS — N186 End stage renal disease: Secondary | ICD-10-CM | POA: Diagnosis not present

## 2018-10-10 DIAGNOSIS — D509 Iron deficiency anemia, unspecified: Secondary | ICD-10-CM | POA: Diagnosis not present

## 2018-10-10 DIAGNOSIS — N2581 Secondary hyperparathyroidism of renal origin: Secondary | ICD-10-CM | POA: Diagnosis not present

## 2018-10-10 DIAGNOSIS — D631 Anemia in chronic kidney disease: Secondary | ICD-10-CM | POA: Diagnosis not present

## 2018-10-13 DIAGNOSIS — N2581 Secondary hyperparathyroidism of renal origin: Secondary | ICD-10-CM | POA: Diagnosis not present

## 2018-10-13 DIAGNOSIS — D509 Iron deficiency anemia, unspecified: Secondary | ICD-10-CM | POA: Diagnosis not present

## 2018-10-13 DIAGNOSIS — N186 End stage renal disease: Secondary | ICD-10-CM | POA: Diagnosis not present

## 2018-10-13 DIAGNOSIS — D631 Anemia in chronic kidney disease: Secondary | ICD-10-CM | POA: Diagnosis not present

## 2018-10-15 DIAGNOSIS — N2581 Secondary hyperparathyroidism of renal origin: Secondary | ICD-10-CM | POA: Diagnosis not present

## 2018-10-15 DIAGNOSIS — D509 Iron deficiency anemia, unspecified: Secondary | ICD-10-CM | POA: Diagnosis not present

## 2018-10-15 DIAGNOSIS — D631 Anemia in chronic kidney disease: Secondary | ICD-10-CM | POA: Diagnosis not present

## 2018-10-15 DIAGNOSIS — N186 End stage renal disease: Secondary | ICD-10-CM | POA: Diagnosis not present

## 2018-10-16 DIAGNOSIS — N186 End stage renal disease: Secondary | ICD-10-CM | POA: Diagnosis not present

## 2018-10-16 DIAGNOSIS — Z992 Dependence on renal dialysis: Secondary | ICD-10-CM | POA: Diagnosis not present

## 2018-10-16 DIAGNOSIS — I871 Compression of vein: Secondary | ICD-10-CM | POA: Diagnosis not present

## 2018-10-17 DIAGNOSIS — N2581 Secondary hyperparathyroidism of renal origin: Secondary | ICD-10-CM | POA: Diagnosis not present

## 2018-10-17 DIAGNOSIS — N186 End stage renal disease: Secondary | ICD-10-CM | POA: Diagnosis not present

## 2018-10-17 DIAGNOSIS — D631 Anemia in chronic kidney disease: Secondary | ICD-10-CM | POA: Diagnosis not present

## 2018-10-17 DIAGNOSIS — D509 Iron deficiency anemia, unspecified: Secondary | ICD-10-CM | POA: Diagnosis not present

## 2018-10-20 DIAGNOSIS — D509 Iron deficiency anemia, unspecified: Secondary | ICD-10-CM | POA: Diagnosis not present

## 2018-10-20 DIAGNOSIS — D631 Anemia in chronic kidney disease: Secondary | ICD-10-CM | POA: Diagnosis not present

## 2018-10-20 DIAGNOSIS — N186 End stage renal disease: Secondary | ICD-10-CM | POA: Diagnosis not present

## 2018-10-20 DIAGNOSIS — N2581 Secondary hyperparathyroidism of renal origin: Secondary | ICD-10-CM | POA: Diagnosis not present

## 2018-10-22 DIAGNOSIS — N2581 Secondary hyperparathyroidism of renal origin: Secondary | ICD-10-CM | POA: Diagnosis not present

## 2018-10-22 DIAGNOSIS — N186 End stage renal disease: Secondary | ICD-10-CM | POA: Diagnosis not present

## 2018-10-22 DIAGNOSIS — D509 Iron deficiency anemia, unspecified: Secondary | ICD-10-CM | POA: Diagnosis not present

## 2018-10-22 DIAGNOSIS — D631 Anemia in chronic kidney disease: Secondary | ICD-10-CM | POA: Diagnosis not present

## 2018-10-24 DIAGNOSIS — D631 Anemia in chronic kidney disease: Secondary | ICD-10-CM | POA: Diagnosis not present

## 2018-10-24 DIAGNOSIS — N2581 Secondary hyperparathyroidism of renal origin: Secondary | ICD-10-CM | POA: Diagnosis not present

## 2018-10-24 DIAGNOSIS — N186 End stage renal disease: Secondary | ICD-10-CM | POA: Diagnosis not present

## 2018-10-24 DIAGNOSIS — D509 Iron deficiency anemia, unspecified: Secondary | ICD-10-CM | POA: Diagnosis not present

## 2018-10-27 DIAGNOSIS — N186 End stage renal disease: Secondary | ICD-10-CM | POA: Diagnosis not present

## 2018-10-27 DIAGNOSIS — N2581 Secondary hyperparathyroidism of renal origin: Secondary | ICD-10-CM | POA: Diagnosis not present

## 2018-10-27 DIAGNOSIS — D631 Anemia in chronic kidney disease: Secondary | ICD-10-CM | POA: Diagnosis not present

## 2018-10-27 DIAGNOSIS — D509 Iron deficiency anemia, unspecified: Secondary | ICD-10-CM | POA: Diagnosis not present

## 2018-10-29 DIAGNOSIS — D631 Anemia in chronic kidney disease: Secondary | ICD-10-CM | POA: Diagnosis not present

## 2018-10-29 DIAGNOSIS — D509 Iron deficiency anemia, unspecified: Secondary | ICD-10-CM | POA: Diagnosis not present

## 2018-10-29 DIAGNOSIS — N186 End stage renal disease: Secondary | ICD-10-CM | POA: Diagnosis not present

## 2018-10-29 DIAGNOSIS — N2581 Secondary hyperparathyroidism of renal origin: Secondary | ICD-10-CM | POA: Diagnosis not present

## 2018-10-31 DIAGNOSIS — N186 End stage renal disease: Secondary | ICD-10-CM | POA: Diagnosis not present

## 2018-10-31 DIAGNOSIS — N2581 Secondary hyperparathyroidism of renal origin: Secondary | ICD-10-CM | POA: Diagnosis not present

## 2018-10-31 DIAGNOSIS — D509 Iron deficiency anemia, unspecified: Secondary | ICD-10-CM | POA: Diagnosis not present

## 2018-10-31 DIAGNOSIS — D631 Anemia in chronic kidney disease: Secondary | ICD-10-CM | POA: Diagnosis not present

## 2018-11-03 DIAGNOSIS — D509 Iron deficiency anemia, unspecified: Secondary | ICD-10-CM | POA: Diagnosis not present

## 2018-11-03 DIAGNOSIS — D631 Anemia in chronic kidney disease: Secondary | ICD-10-CM | POA: Diagnosis not present

## 2018-11-03 DIAGNOSIS — N2581 Secondary hyperparathyroidism of renal origin: Secondary | ICD-10-CM | POA: Diagnosis not present

## 2018-11-03 DIAGNOSIS — N186 End stage renal disease: Secondary | ICD-10-CM | POA: Diagnosis not present

## 2018-11-04 DIAGNOSIS — Z992 Dependence on renal dialysis: Secondary | ICD-10-CM | POA: Diagnosis not present

## 2018-11-04 DIAGNOSIS — E1122 Type 2 diabetes mellitus with diabetic chronic kidney disease: Secondary | ICD-10-CM | POA: Diagnosis not present

## 2018-11-04 DIAGNOSIS — N186 End stage renal disease: Secondary | ICD-10-CM | POA: Diagnosis not present

## 2018-11-05 DIAGNOSIS — D509 Iron deficiency anemia, unspecified: Secondary | ICD-10-CM | POA: Diagnosis not present

## 2018-11-05 DIAGNOSIS — D631 Anemia in chronic kidney disease: Secondary | ICD-10-CM | POA: Diagnosis not present

## 2018-11-05 DIAGNOSIS — N2581 Secondary hyperparathyroidism of renal origin: Secondary | ICD-10-CM | POA: Diagnosis not present

## 2018-11-05 DIAGNOSIS — N186 End stage renal disease: Secondary | ICD-10-CM | POA: Diagnosis not present

## 2018-11-07 DIAGNOSIS — N2581 Secondary hyperparathyroidism of renal origin: Secondary | ICD-10-CM | POA: Diagnosis not present

## 2018-11-07 DIAGNOSIS — D631 Anemia in chronic kidney disease: Secondary | ICD-10-CM | POA: Diagnosis not present

## 2018-11-07 DIAGNOSIS — D509 Iron deficiency anemia, unspecified: Secondary | ICD-10-CM | POA: Diagnosis not present

## 2018-11-07 DIAGNOSIS — N186 End stage renal disease: Secondary | ICD-10-CM | POA: Diagnosis not present

## 2018-11-10 DIAGNOSIS — D631 Anemia in chronic kidney disease: Secondary | ICD-10-CM | POA: Diagnosis not present

## 2018-11-10 DIAGNOSIS — N2581 Secondary hyperparathyroidism of renal origin: Secondary | ICD-10-CM | POA: Diagnosis not present

## 2018-11-10 DIAGNOSIS — N186 End stage renal disease: Secondary | ICD-10-CM | POA: Diagnosis not present

## 2018-11-10 DIAGNOSIS — D509 Iron deficiency anemia, unspecified: Secondary | ICD-10-CM | POA: Diagnosis not present

## 2018-11-12 DIAGNOSIS — N2581 Secondary hyperparathyroidism of renal origin: Secondary | ICD-10-CM | POA: Diagnosis not present

## 2018-11-12 DIAGNOSIS — N186 End stage renal disease: Secondary | ICD-10-CM | POA: Diagnosis not present

## 2018-11-12 DIAGNOSIS — D509 Iron deficiency anemia, unspecified: Secondary | ICD-10-CM | POA: Diagnosis not present

## 2018-11-12 DIAGNOSIS — D631 Anemia in chronic kidney disease: Secondary | ICD-10-CM | POA: Diagnosis not present

## 2018-11-14 DIAGNOSIS — N186 End stage renal disease: Secondary | ICD-10-CM | POA: Diagnosis not present

## 2018-11-14 DIAGNOSIS — D509 Iron deficiency anemia, unspecified: Secondary | ICD-10-CM | POA: Diagnosis not present

## 2018-11-14 DIAGNOSIS — D631 Anemia in chronic kidney disease: Secondary | ICD-10-CM | POA: Diagnosis not present

## 2018-11-14 DIAGNOSIS — N2581 Secondary hyperparathyroidism of renal origin: Secondary | ICD-10-CM | POA: Diagnosis not present

## 2018-11-17 DIAGNOSIS — N2581 Secondary hyperparathyroidism of renal origin: Secondary | ICD-10-CM | POA: Diagnosis not present

## 2018-11-17 DIAGNOSIS — D631 Anemia in chronic kidney disease: Secondary | ICD-10-CM | POA: Diagnosis not present

## 2018-11-17 DIAGNOSIS — D509 Iron deficiency anemia, unspecified: Secondary | ICD-10-CM | POA: Diagnosis not present

## 2018-11-17 DIAGNOSIS — N186 End stage renal disease: Secondary | ICD-10-CM | POA: Diagnosis not present

## 2018-11-19 DIAGNOSIS — D631 Anemia in chronic kidney disease: Secondary | ICD-10-CM | POA: Diagnosis not present

## 2018-11-19 DIAGNOSIS — E1129 Type 2 diabetes mellitus with other diabetic kidney complication: Secondary | ICD-10-CM | POA: Diagnosis not present

## 2018-11-19 DIAGNOSIS — N186 End stage renal disease: Secondary | ICD-10-CM | POA: Diagnosis not present

## 2018-11-19 DIAGNOSIS — N2581 Secondary hyperparathyroidism of renal origin: Secondary | ICD-10-CM | POA: Diagnosis not present

## 2018-11-19 DIAGNOSIS — D509 Iron deficiency anemia, unspecified: Secondary | ICD-10-CM | POA: Diagnosis not present

## 2018-11-20 ENCOUNTER — Ambulatory Visit: Payer: Medicare Other | Admitting: Dietician

## 2018-11-21 DIAGNOSIS — N186 End stage renal disease: Secondary | ICD-10-CM | POA: Diagnosis not present

## 2018-11-21 DIAGNOSIS — D509 Iron deficiency anemia, unspecified: Secondary | ICD-10-CM | POA: Diagnosis not present

## 2018-11-21 DIAGNOSIS — D631 Anemia in chronic kidney disease: Secondary | ICD-10-CM | POA: Diagnosis not present

## 2018-11-21 DIAGNOSIS — N2581 Secondary hyperparathyroidism of renal origin: Secondary | ICD-10-CM | POA: Diagnosis not present

## 2018-11-24 DIAGNOSIS — N2581 Secondary hyperparathyroidism of renal origin: Secondary | ICD-10-CM | POA: Diagnosis not present

## 2018-11-24 DIAGNOSIS — D509 Iron deficiency anemia, unspecified: Secondary | ICD-10-CM | POA: Diagnosis not present

## 2018-11-24 DIAGNOSIS — D631 Anemia in chronic kidney disease: Secondary | ICD-10-CM | POA: Diagnosis not present

## 2018-11-24 DIAGNOSIS — N186 End stage renal disease: Secondary | ICD-10-CM | POA: Diagnosis not present

## 2018-11-26 DIAGNOSIS — N2581 Secondary hyperparathyroidism of renal origin: Secondary | ICD-10-CM | POA: Diagnosis not present

## 2018-11-26 DIAGNOSIS — N186 End stage renal disease: Secondary | ICD-10-CM | POA: Diagnosis not present

## 2018-11-26 DIAGNOSIS — D509 Iron deficiency anemia, unspecified: Secondary | ICD-10-CM | POA: Diagnosis not present

## 2018-11-26 DIAGNOSIS — D631 Anemia in chronic kidney disease: Secondary | ICD-10-CM | POA: Diagnosis not present

## 2018-11-28 DIAGNOSIS — N2581 Secondary hyperparathyroidism of renal origin: Secondary | ICD-10-CM | POA: Diagnosis not present

## 2018-11-28 DIAGNOSIS — D509 Iron deficiency anemia, unspecified: Secondary | ICD-10-CM | POA: Diagnosis not present

## 2018-11-28 DIAGNOSIS — N186 End stage renal disease: Secondary | ICD-10-CM | POA: Diagnosis not present

## 2018-11-28 DIAGNOSIS — D631 Anemia in chronic kidney disease: Secondary | ICD-10-CM | POA: Diagnosis not present

## 2018-12-01 DIAGNOSIS — D631 Anemia in chronic kidney disease: Secondary | ICD-10-CM | POA: Diagnosis not present

## 2018-12-01 DIAGNOSIS — D509 Iron deficiency anemia, unspecified: Secondary | ICD-10-CM | POA: Diagnosis not present

## 2018-12-01 DIAGNOSIS — N186 End stage renal disease: Secondary | ICD-10-CM | POA: Diagnosis not present

## 2018-12-01 DIAGNOSIS — N2581 Secondary hyperparathyroidism of renal origin: Secondary | ICD-10-CM | POA: Diagnosis not present

## 2018-12-03 DIAGNOSIS — N2581 Secondary hyperparathyroidism of renal origin: Secondary | ICD-10-CM | POA: Diagnosis not present

## 2018-12-03 DIAGNOSIS — N186 End stage renal disease: Secondary | ICD-10-CM | POA: Diagnosis not present

## 2018-12-03 DIAGNOSIS — D631 Anemia in chronic kidney disease: Secondary | ICD-10-CM | POA: Diagnosis not present

## 2018-12-03 DIAGNOSIS — D509 Iron deficiency anemia, unspecified: Secondary | ICD-10-CM | POA: Diagnosis not present

## 2018-12-04 DIAGNOSIS — Z992 Dependence on renal dialysis: Secondary | ICD-10-CM | POA: Diagnosis not present

## 2018-12-04 DIAGNOSIS — E1122 Type 2 diabetes mellitus with diabetic chronic kidney disease: Secondary | ICD-10-CM | POA: Diagnosis not present

## 2018-12-04 DIAGNOSIS — N186 End stage renal disease: Secondary | ICD-10-CM | POA: Diagnosis not present

## 2018-12-05 DIAGNOSIS — N186 End stage renal disease: Secondary | ICD-10-CM | POA: Diagnosis not present

## 2018-12-05 DIAGNOSIS — D631 Anemia in chronic kidney disease: Secondary | ICD-10-CM | POA: Diagnosis not present

## 2018-12-05 DIAGNOSIS — D509 Iron deficiency anemia, unspecified: Secondary | ICD-10-CM | POA: Diagnosis not present

## 2018-12-05 DIAGNOSIS — N2581 Secondary hyperparathyroidism of renal origin: Secondary | ICD-10-CM | POA: Diagnosis not present

## 2018-12-08 DIAGNOSIS — D631 Anemia in chronic kidney disease: Secondary | ICD-10-CM | POA: Diagnosis not present

## 2018-12-08 DIAGNOSIS — D509 Iron deficiency anemia, unspecified: Secondary | ICD-10-CM | POA: Diagnosis not present

## 2018-12-08 DIAGNOSIS — N2581 Secondary hyperparathyroidism of renal origin: Secondary | ICD-10-CM | POA: Diagnosis not present

## 2018-12-08 DIAGNOSIS — N186 End stage renal disease: Secondary | ICD-10-CM | POA: Diagnosis not present

## 2018-12-10 ENCOUNTER — Ambulatory Visit: Payer: Medicare Other | Admitting: Dietician

## 2018-12-10 DIAGNOSIS — D631 Anemia in chronic kidney disease: Secondary | ICD-10-CM | POA: Diagnosis not present

## 2018-12-10 DIAGNOSIS — N2581 Secondary hyperparathyroidism of renal origin: Secondary | ICD-10-CM | POA: Diagnosis not present

## 2018-12-10 DIAGNOSIS — D509 Iron deficiency anemia, unspecified: Secondary | ICD-10-CM | POA: Diagnosis not present

## 2018-12-10 DIAGNOSIS — N186 End stage renal disease: Secondary | ICD-10-CM | POA: Diagnosis not present

## 2018-12-12 DIAGNOSIS — D509 Iron deficiency anemia, unspecified: Secondary | ICD-10-CM | POA: Diagnosis not present

## 2018-12-12 DIAGNOSIS — N186 End stage renal disease: Secondary | ICD-10-CM | POA: Diagnosis not present

## 2018-12-12 DIAGNOSIS — N2581 Secondary hyperparathyroidism of renal origin: Secondary | ICD-10-CM | POA: Diagnosis not present

## 2018-12-12 DIAGNOSIS — D631 Anemia in chronic kidney disease: Secondary | ICD-10-CM | POA: Diagnosis not present

## 2018-12-14 ENCOUNTER — Ambulatory Visit (INDEPENDENT_AMBULATORY_CARE_PROVIDER_SITE_OTHER): Payer: Medicare Other | Admitting: Podiatry

## 2018-12-14 ENCOUNTER — Other Ambulatory Visit: Payer: Self-pay

## 2018-12-14 ENCOUNTER — Encounter: Payer: Self-pay | Admitting: Podiatry

## 2018-12-14 VITALS — Temp 97.9°F

## 2018-12-14 DIAGNOSIS — L97511 Non-pressure chronic ulcer of other part of right foot limited to breakdown of skin: Secondary | ICD-10-CM

## 2018-12-14 DIAGNOSIS — E1149 Type 2 diabetes mellitus with other diabetic neurological complication: Secondary | ICD-10-CM | POA: Diagnosis not present

## 2018-12-14 MED ORDER — MUPIROCIN 2 % EX OINT: 1.0000 "application " | TOPICAL_OINTMENT | Freq: Two times a day (BID) | CUTANEOUS | 2 refills | Status: DC

## 2018-12-14 NOTE — Patient Instructions (Signed)
Apply antibiotic ointment to the right big toe daily. Once the area has healed I would use a small amount of moisturizer to the area daily, not between your toes.   Monitor for any signs/symptoms of infection. Call the office immediately if any occur or go directly to the emergency room. Call with any questions/concerns.

## 2018-12-14 NOTE — Progress Notes (Signed)
Subjective:   Patient ID: William [name], male   DOB: 68 y.o.   MRN: [id number]   HPI 68 year old male presents the office after his son is with interpreter for concerns of a possible skin lesion, cracking skin on the right big toe.  He states he had a similar thing happen about 5 years ago.  About 3 months ago it has been open.  Denies any drainage or pus coming from the area.  He gets occasional swelling at times.  He has no other concerns today.   Review of Systems  All other systems reviewed and are negative.  Past Medical History:  Diagnosis Date  . Anemia   . CHF (congestive heart failure) ([place])   . Cirrhosis ([place])   . Cirrhosis ([place])   . CKD (chronic kidney disease), stage IV ([place])   . Goodpasture's disease [facility])    on outpatient plasmapheresis/notes 11/20/2017  . Grade I diastolic dysfunction 16/94/5038  . History of anemia due to chronic kidney disease   . History of blood transfusion X 1   "UGIB; low blood count"  . History of plasmapheresis    "qod" (11/20/2017)  . Hypertension   . Pancytopenia ([place]) 08/20/2017  . Type II diabetes mellitus ([place])     Past Surgical History:  Procedure Laterality Date  . AV FISTULA PLACEMENT Left 12/26/2017   Procedure: LEFT BRACHIOCEPHALIC ARTERIOVENOUS (AV) FISTULA CREATION;  Surgeon: Conrad Grapevine, MD;  Location: [place];  Service: Vascular;  Laterality: Left;  . BASCILIC VEIN TRANSPOSITION Left 02/16/2018   Procedure: BRACHIOBASILIC VEIN TRANSPOSITION SECOND STAGE;  Surgeon: Conrad , MD;  Location: [place];  Service: Vascular;  Laterality: Left;  . CATARACT EXTRACTION W/ INTRAOCULAR LENS  IMPLANT, BILATERAL Bilateral   . ESOPHAGEAL BANDING N/A 04/01/2018   Procedure: ESOPHAGEAL BANDING;  Surgeon: Otis Brace, MD;  [facility];  Service: Gastroenterology;  Laterality: N/A;  . ESOPHAGEAL BANDING N/A 06/29/2018   Procedure: ESOPHAGEAL BANDING;  Surgeon: Otis Brace, MD;  [facility];  Service:  Gastroenterology;  Laterality: N/A;  . ESOPHAGOGASTRODUODENOSCOPY (EGD) WITH PROPOFOL N/A 04/01/2018   Procedure: ESOPHAGOGASTRODUODENOSCOPY (EGD) WITH PROPOFOL;  Surgeon: Otis Brace, MD;  [facility];  Service: Gastroenterology;  Laterality: N/A;  . ESOPHAGOGASTRODUODENOSCOPY (EGD) WITH PROPOFOL N/A 06/29/2018   Procedure: ESOPHAGOGASTRODUODENOSCOPY (EGD) WITH PROPOFOL;  Surgeon: Otis Brace, MD;  [facility];  Service: Gastroenterology;  Laterality: N/A;  . ESOPHAGOGASTRODUODENOSCOPY (EGD) WITH PROPOFOL N/A 09/30/2018   Procedure: ESOPHAGOGASTRODUODENOSCOPY (EGD) WITH PROPOFOL;  Surgeon: Otis Brace, MD;  [facility];  Service: Gastroenterology;  Laterality: N/A;  . I&D EXTREMITY Left 02/18/2018   Procedure: IRRIGATION AND DEBRIDEMENT LEFT ARM;  Surgeon: Serafina Mitchell, MD;  Location: [place];  Service: Vascular;  Laterality: Left;  . INSERTION OF DIALYSIS CATHETER N/A 01/19/2018   Procedure: INSERTION OF DIALYSIS CATHETER - RIGHT INTERNAL JUGULAR PLACEMENT;  Surgeon: Rosetta Posner, MD;  Location: [place];  Service: Vascular;  Laterality: N/A;  . IR FLUORO GUIDE CV LINE RIGHT  11/10/2017  . IR PARACENTESIS  12/10/2017  . IR PARACENTESIS  12/26/2017  . IR REMOVAL TUN CV CATH W/O FL  11/28/2017  . IR US GUIDE VASC ACCESS RIGHT  11/10/2017  . NECK SURGERY  2007   "back of neck; no hardware in there"     Current Outpatient Medications:  .  acetaminophen (TYLENOL) 650 MG CR tablet, Take 650 mg by mouth every 8 (eight) hours as needed for pain., Disp: , Rfl:  .  amLODipine (NORVASC) 10 MG tablet, Take 1 tablet (10 mg total) by mouth daily. (Patient taking differently: Take 10 mg by mouth daily. Son states that the 10 mg has reduced down to 5 mg. Lattie Haw), Disp: 90 tablet, Rfl: 3 .  calcium acetate (PHOSLO) 667 MG capsule, Take 2 capsules (1,334 mg total) by mouth 3 (three) times daily with meals., Disp: 90 capsule, Rfl: 1 .  carvedilol (COREG) 3.125 MG tablet,  Take 3.125 mg by mouth 2 (two) times daily., Disp: , Rfl:  .  ferrous gluconate (IRON 27) 240 (27 FE) MG tablet, Take 240 mg by mouth daily. , Disp: , Rfl:  .  glucose blood (ONETOUCH VERIO) test strip, Use to check blood sugar 3 times a day dx e11.65, Disp: 150 each, Rfl: 3 .  Insulin Glargine (TOUJEO MAX SOLOSTAR) 300 UNIT/ML SOPN, Inject 12 Units into the skin daily. (Patient taking differently: Inject 14 Units into the skin daily. ), Disp: 5 pen, Rfl: 1 .  Insulin Pen Needle (ADVOCATE INSULIN PEN NEEDLES) 31G X 5 MM MISC, Use four times daily to inject insulin, Disp: 150 each, Rfl: 1 .  insulin regular (NOVOLIN R,HUMULIN R) 100 units/mL injection, Inject 6-8 Units into the skin See admin instructions. INJECT 6 UNITS UNDER THE SKIN AT BREAKFAST, AND 8 UNITS AT LUNCH AND DINNER. , Disp: , Rfl:  .  lidocaine-prilocaine (EMLA) cream, Apply 1 application topically Every Tuesday,Thursday,and Saturday with dialysis., Disp: , Rfl:  .  Multiple Vitamins-Minerals (MULTIVITAMIN WITH MINERALS) tablet, Take 1 tablet by mouth daily., Disp: , Rfl:  .  mupirocin ointment (BACTROBAN) 2 %, Apply 1 application topically 2 (two) times daily., Disp: 30 g, Rfl: 2 .  ONE TOUCH LANCETS MISC, Use to test blood sugar three times daily, Disp: 100 each, Rfl: 2 .  Polyethyl Glycol-Propyl Glycol (SYSTANE) 0.4-0.3 % SOLN, Place 1 drop into both eyes 3 (three) times daily as needed (for dry eyes)., Disp: , Rfl:   Allergies  Allergen Reactions  . Heparin Other (See Comments)    Patient is Muslim and is not permitted Pork derative due to religious belief)  . Pork-Derived Products Other (See Comments)    Patient does not eat pork due to religious beliefs         Objective:  Physical Exam  General: AAO x3, NAD  Dermatological: Hyperkeratotic lesion on the distal plantar aspect the right hallux.  Upon debridement of this area there is a small superficial area of skin breakdown there is no drainage or pus there is no  edema, erythema.  There is no ascending cellulitis.  There is no malodor.  Minimal hyperkeratotic tissue and somewhat variable control extremity with any underlying ulceration drainage or signs of infection.  No open lesions.  Vascular: Dorsalis Pedis artery and Posterior Tibial artery pedal pulses are 2/4 bilateral with immedate capillary fill time.There is no pain with calf compression, swelling, warmth, erythema.   Neruologic: Sensation decreased with Semmes Weinstein monofilament.  Musculoskeletal: No gross boney pedal deformities bilateral. No pain, crepitus, or limitation noted with foot and ankle range of motion bilateral. Muscular strength 5/5 in all groups tested bilateral.  Gait: Unassisted, Nonantalgic.       Assessment:   Superficial ulcer right hallux without signs of infection    Plan:  -Treatment options discussed including all alternatives, risks, and complications -Etiology of symptoms were discussed -Patient there is a hyperkeratotic lesion without any complications or bleeding to reveal the underlying wound.  Recommended mupirocin ointment dressing changes  daily which I prescribed today.  Offloading at all times.  Modify signs or symptoms of infection. -Discussed importance of daily foot inspection.  Glucose control.  Return in about 3 weeks (around 01/04/2019).

## 2018-12-15 DIAGNOSIS — D509 Iron deficiency anemia, unspecified: Secondary | ICD-10-CM | POA: Diagnosis not present

## 2018-12-15 DIAGNOSIS — D631 Anemia in chronic kidney disease: Secondary | ICD-10-CM | POA: Diagnosis not present

## 2018-12-15 DIAGNOSIS — N2581 Secondary hyperparathyroidism of renal origin: Secondary | ICD-10-CM | POA: Diagnosis not present

## 2018-12-15 DIAGNOSIS — N186 End stage renal disease: Secondary | ICD-10-CM | POA: Diagnosis not present

## 2018-12-17 DIAGNOSIS — N2581 Secondary hyperparathyroidism of renal origin: Secondary | ICD-10-CM | POA: Diagnosis not present

## 2018-12-17 DIAGNOSIS — N186 End stage renal disease: Secondary | ICD-10-CM | POA: Diagnosis not present

## 2018-12-17 DIAGNOSIS — D631 Anemia in chronic kidney disease: Secondary | ICD-10-CM | POA: Diagnosis not present

## 2018-12-17 DIAGNOSIS — D509 Iron deficiency anemia, unspecified: Secondary | ICD-10-CM | POA: Diagnosis not present

## 2018-12-19 DIAGNOSIS — N2581 Secondary hyperparathyroidism of renal origin: Secondary | ICD-10-CM | POA: Diagnosis not present

## 2018-12-19 DIAGNOSIS — N186 End stage renal disease: Secondary | ICD-10-CM | POA: Diagnosis not present

## 2018-12-19 DIAGNOSIS — D509 Iron deficiency anemia, unspecified: Secondary | ICD-10-CM | POA: Diagnosis not present

## 2018-12-19 DIAGNOSIS — D631 Anemia in chronic kidney disease: Secondary | ICD-10-CM | POA: Diagnosis not present

## 2018-12-22 DIAGNOSIS — D631 Anemia in chronic kidney disease: Secondary | ICD-10-CM | POA: Diagnosis not present

## 2018-12-22 DIAGNOSIS — N186 End stage renal disease: Secondary | ICD-10-CM | POA: Diagnosis not present

## 2018-12-22 DIAGNOSIS — N2581 Secondary hyperparathyroidism of renal origin: Secondary | ICD-10-CM | POA: Diagnosis not present

## 2018-12-22 DIAGNOSIS — D509 Iron deficiency anemia, unspecified: Secondary | ICD-10-CM | POA: Diagnosis not present

## 2018-12-24 DIAGNOSIS — N186 End stage renal disease: Secondary | ICD-10-CM | POA: Diagnosis not present

## 2018-12-24 DIAGNOSIS — D631 Anemia in chronic kidney disease: Secondary | ICD-10-CM | POA: Diagnosis not present

## 2018-12-24 DIAGNOSIS — N2581 Secondary hyperparathyroidism of renal origin: Secondary | ICD-10-CM | POA: Diagnosis not present

## 2018-12-24 DIAGNOSIS — D509 Iron deficiency anemia, unspecified: Secondary | ICD-10-CM | POA: Diagnosis not present

## 2018-12-26 DIAGNOSIS — N186 End stage renal disease: Secondary | ICD-10-CM | POA: Diagnosis not present

## 2018-12-26 DIAGNOSIS — D509 Iron deficiency anemia, unspecified: Secondary | ICD-10-CM | POA: Diagnosis not present

## 2018-12-26 DIAGNOSIS — N2581 Secondary hyperparathyroidism of renal origin: Secondary | ICD-10-CM | POA: Diagnosis not present

## 2018-12-26 DIAGNOSIS — D631 Anemia in chronic kidney disease: Secondary | ICD-10-CM | POA: Diagnosis not present

## 2018-12-29 DIAGNOSIS — N186 End stage renal disease: Secondary | ICD-10-CM | POA: Diagnosis not present

## 2018-12-29 DIAGNOSIS — D631 Anemia in chronic kidney disease: Secondary | ICD-10-CM | POA: Diagnosis not present

## 2018-12-29 DIAGNOSIS — D509 Iron deficiency anemia, unspecified: Secondary | ICD-10-CM | POA: Diagnosis not present

## 2018-12-29 DIAGNOSIS — N2581 Secondary hyperparathyroidism of renal origin: Secondary | ICD-10-CM | POA: Diagnosis not present

## 2018-12-31 DIAGNOSIS — N2581 Secondary hyperparathyroidism of renal origin: Secondary | ICD-10-CM | POA: Diagnosis not present

## 2018-12-31 DIAGNOSIS — D631 Anemia in chronic kidney disease: Secondary | ICD-10-CM | POA: Diagnosis not present

## 2018-12-31 DIAGNOSIS — N186 End stage renal disease: Secondary | ICD-10-CM | POA: Diagnosis not present

## 2018-12-31 DIAGNOSIS — D509 Iron deficiency anemia, unspecified: Secondary | ICD-10-CM | POA: Diagnosis not present

## 2019-01-02 DIAGNOSIS — N2581 Secondary hyperparathyroidism of renal origin: Secondary | ICD-10-CM | POA: Diagnosis not present

## 2019-01-02 DIAGNOSIS — N186 End stage renal disease: Secondary | ICD-10-CM | POA: Diagnosis not present

## 2019-01-02 DIAGNOSIS — D509 Iron deficiency anemia, unspecified: Secondary | ICD-10-CM | POA: Diagnosis not present

## 2019-01-02 DIAGNOSIS — D631 Anemia in chronic kidney disease: Secondary | ICD-10-CM | POA: Diagnosis not present

## 2019-01-04 ENCOUNTER — Encounter: Payer: Self-pay | Admitting: Podiatry

## 2019-01-04 ENCOUNTER — Ambulatory Visit (INDEPENDENT_AMBULATORY_CARE_PROVIDER_SITE_OTHER): Payer: Medicare Other | Admitting: Podiatry

## 2019-01-04 ENCOUNTER — Other Ambulatory Visit: Payer: Self-pay

## 2019-01-04 VITALS — Temp 97.5°F

## 2019-01-04 DIAGNOSIS — M2041 Other hammer toe(s) (acquired), right foot: Secondary | ICD-10-CM

## 2019-01-04 DIAGNOSIS — B351 Tinea unguium: Secondary | ICD-10-CM

## 2019-01-04 DIAGNOSIS — M79675 Pain in left toe(s): Secondary | ICD-10-CM

## 2019-01-04 DIAGNOSIS — M2042 Other hammer toe(s) (acquired), left foot: Secondary | ICD-10-CM | POA: Diagnosis not present

## 2019-01-04 DIAGNOSIS — M79674 Pain in right toe(s): Secondary | ICD-10-CM | POA: Diagnosis not present

## 2019-01-04 DIAGNOSIS — E1149 Type 2 diabetes mellitus with other diabetic neurological complication: Secondary | ICD-10-CM

## 2019-01-04 DIAGNOSIS — L97511 Non-pressure chronic ulcer of other part of right foot limited to breakdown of skin: Secondary | ICD-10-CM | POA: Diagnosis not present

## 2019-01-04 NOTE — Progress Notes (Signed)
Subjective: 68 year old male presents the office today with an interpreter as well as his son for follow-up evaluation of a wound to his right big toe.  He is been keeping antibiotic ointment and a bandage on the area is doing a lot better he reports.  He denies any swelling, redness, drainage.  His nails are also thick he cannot trim them himself. Denies any systemic complaints such as fevers, chills, nausea, vomiting. No acute changes since last appointment, and no other complaints at this time.   He states that his blood sugar has been "perfect". His son does state it gets into the 300's.   Last A1c was 7.6 on 10/06/2018  Objective: AAO x3, NAD DP/PT pulses palpable bilaterally, CRT less than 3 seconds Protective sensation decreased with Simms Weinstein monofilament Hyperkeratotic lesions present the distal aspects of bilateral hallux.  Upon debridement there is no ongoing ulceration, drainage or any signs of infection today.  The right is pre-ulcerative but there is no skin breakdown identified today.  There is no drainage or pus or any signs of infection. Nails are mildly hypertrophic, dystrophic and discolored with yellow-brown discoloration.  There is tenderness suggestive of the nails as well 1-5 bilaterally.  Hammertoe deformities are present with hallux valgus. No open lesions or pre-ulcerative lesions.  No pain with calf compression, swelling, warmth, erythema      Assessment: Healed wound right hallux symptomatic onychomycosis  Plan: -All treatment options discussed with the patient including all alternatives, risks, complications.  -I debrided hyperkeratotic tissue to bilateral hallux without any complications or bleeding.  Continue open all times.  The wound appears to be healed.  Continue offloading for now.  Paperwork submitted for precertification diabetic shoes.  Given his history of diabetes, wounds and digital deformity I think you benefit from this. -Nails debrided x10  without any complications or bleeding. -Patient encouraged to call the office with any questions, concerns, change in symptoms.   Trula Slade DPM

## 2019-01-05 DIAGNOSIS — N186 End stage renal disease: Secondary | ICD-10-CM | POA: Diagnosis not present

## 2019-01-05 DIAGNOSIS — D631 Anemia in chronic kidney disease: Secondary | ICD-10-CM | POA: Diagnosis not present

## 2019-01-05 DIAGNOSIS — N2581 Secondary hyperparathyroidism of renal origin: Secondary | ICD-10-CM | POA: Diagnosis not present

## 2019-01-05 DIAGNOSIS — D509 Iron deficiency anemia, unspecified: Secondary | ICD-10-CM | POA: Diagnosis not present

## 2019-01-05 NOTE — Progress Notes (Deleted)
Patient ID: William [name], male   DOB: 05-25-1951, 68 y.o.   MRN: [id number]          Reason for Appointment: Follow-up for Type 2 Diabetes  Referring physician: Dr. Carole Civil   History of Present Illness:          Date of diagnosis of type 2 diabetes mellitus: 1990s       Background history:   He has been on insulin for about 15 years, previously taking unknown oral agents Has been usually taking insulin with a syringe and only one type of premixed insulin Previous A1c has ranged between 6.6 and 9.2 in the last 2 years that records are available  Recent history:   Most recent A1c is 7.7, previously was 7% done in 8/19  INSULIN regimen is:  Toujeo 14 units in a.m. Novolin R at mealtimes 6-8-8  He has his dialysis 3 times a week at 11 AM  Current management, blood sugar patterns and problems identified:  He still has very labile blood sugars  Also his blood sugars are not consistent at any given time  As before he is not checking readings after meals and mostly right before eating  He has been told to add a protein to each meal and not clear if he is doing this especially at dinnertime when he frequently has grits and fruit sometimes an egg or fish  Yesterday forgot his insulin at lunchtime causing his blood sugar to be 382 later  Otherwise his blood sugars were much better than usual yesterday and fasting was 95 today  His son does not think that he is forgetting his Toujeo  Also because of communication difficulty is not clear if his compliance with diet, snacks and insulin is consistent  Also relatively inactive lately because of winter months  No hypoglycemia         Side effects from medications have been: None known  Compliance with the medical regimen: Fair     Meal times are:  Breakfast is at 10-11 AM lunch: 2.30 Dinner: 10-11 PM                                                                                                                       Typical  meal intake: Breakfast is  an egg, bread and some fruit.  His daughter-in-law is usually doing the cooking        Dinner grits, fruit       Exercise:  Walking 15-30 minutes about every other day in the late afternoon  Glucose monitoring:  done about 3 times a day         Glucometer: brand.     One Touch Verio    Blood Glucose readings by download of monitor   PRE-MEAL Fasting Lunch Dinner Bedtime Overall  Glucose range:  95-337   102-601    Mean/median:  185  198  273   220+/-124   POST-MEAL PC Breakfast PC Lunch PC Dinner  Glucose  range:     Mean/median:      PREVIOUS readings:  PRE-MEAL Fasting Lunch Dinner Bedtime Overall  Glucose range:  110-260  47-357  75-601    Mean/median:  183  156  203+/-109   193+/-113   POST-MEAL PC Breakfast PC Lunch PC Dinner  Glucose range:    297-440  Mean/median:    332     CDE visit: 05/2018 Dietician visit, most recent: Never except some interaction in the hospital  Weight history:  Wt Readings from Last 3 Encounters:  10/06/18 142 lb (64.4 kg)  09/30/18 141 lb 1.5 oz (64 kg)  08/24/18 142 lb 6.4 oz (64.6 kg)    Glycemic control:   Lab Results  Component Value Date   HGBA1C 7.6 (A) 10/06/2018   HGBA1C 7.7 (H) 07/09/2018   HGBA1C 7.0 (H) 03/09/2018   Lab Results  Component Value Date   MICROALBUR 91.4 08/25/2015   LDLCALC 93 05/21/2018   CREATININE 4.04 (HH) 03/09/2018   No results found for: MICRALBCREAT  Lab Results  Component Value Date   FRUCTOSAMINE 524 (H) 05/21/2018    No visits with results within 1 Week(s) from this visit.  Latest known visit with results is:  Office Visit on 10/06/2018  Component Date Value Ref Range Status  . Hemoglobin A1C 10/06/2018 7.6* 4.0 - 5.6 % Final    Allergies as of 01/06/2019      Reactions   Heparin Other (See Comments)   Patient is Muslim and is not permitted Pork derative due to religious belief)   Pork-derived Products Other (See Comments)   Patient does not eat pork  due to religious beliefs      Medication List       Accurate as of January 05, 2019  9:21 PM. If you have any questions, ask your nurse or doctor.        acetaminophen 650 MG CR tablet Commonly known as:  TYLENOL Take 650 mg by mouth every 8 (eight) hours as needed for pain.   amLODipine 10 MG tablet Commonly known as:  Norvasc Take 1 tablet (10 mg total) by mouth daily. What changed:  additional instructions   calcium acetate 667 MG capsule Commonly known as:  PHOSLO Take 2 capsules (1,334 mg total) by mouth 3 (three) times daily with meals.   carvedilol 3.125 MG tablet Commonly known as:  COREG Take 3.125 mg by mouth 2 (two) times daily.   glucose blood test strip Commonly known as:  OneTouch Verio Use to check blood sugar 3 times a day dx e11.65   Insulin Glargine 300 UNIT/ML Sopn Commonly known as:  Toujeo Max SoloStar Inject 12 Units into the skin daily. What changed:  how much to take   Insulin Pen Needle 31G X 5 MM Misc Commonly known as:  Advocate Insulin Pen Needles Use four times daily to inject insulin   insulin regular 100 units/mL injection Commonly known as:  NOVOLIN R Inject 6-8 Units into the skin See admin instructions. INJECT 6 UNITS UNDER THE SKIN AT BREAKFAST, AND 8 UNITS AT LUNCH AND DINNER.   Iron 27 240 (27 FE) MG tablet Generic drug:  ferrous gluconate Take 240 mg by mouth daily.   lidocaine-prilocaine cream Commonly known as:  EMLA Apply 1 application topically Every Tuesday,Thursday,and Saturday with dialysis.   multivitamin with minerals tablet Take 1 tablet by mouth daily.   mupirocin ointment 2 % Commonly known as:  BACTROBAN Apply 1 application topically 2 (two) times daily.  ONE TOUCH LANCETS Misc Use to test blood sugar three times daily   Systane 0.4-0.3 % Soln Generic drug:  Polyethyl Glycol-Propyl Glycol Place 1 drop into both eyes 3 (three) times daily as needed (for dry eyes).       Allergies:  Allergies   Allergen Reactions  . Heparin Other (See Comments)    Patient is Muslim and is not permitted Pork derative due to religious belief)  . Pork-Derived Products Other (See Comments)    Patient does not eat pork due to religious beliefs    Past Medical History:  Diagnosis Date  . Anemia   . CHF (congestive heart failure) ([place])   . Cirrhosis ([place])   . Cirrhosis ([place])   . CKD (chronic kidney disease), stage IV ([place])   . Goodpasture's disease [facility])    on outpatient plasmapheresis/notes 11/20/2017  . Grade I diastolic dysfunction 70/35/0093  . History of anemia due to chronic kidney disease   . History of blood transfusion X 1   "UGIB; low blood count"  . History of plasmapheresis    "qod" (11/20/2017)  . Hypertension   . Pancytopenia ([place]) 08/20/2017  . Type II diabetes mellitus ([place])     Past Surgical History:  Procedure Laterality Date  . AV FISTULA PLACEMENT Left 12/26/2017   Procedure: LEFT BRACHIOCEPHALIC ARTERIOVENOUS (AV) FISTULA CREATION;  Surgeon: Conrad Girard, MD;  Location: [place];  Service: Vascular;  Laterality: Left;  . BASCILIC VEIN TRANSPOSITION Left 02/16/2018   Procedure: BRACHIOBASILIC VEIN TRANSPOSITION SECOND STAGE;  Surgeon: Conrad Brandon, MD;  Location: [place];  Service: Vascular;  Laterality: Left;  . CATARACT EXTRACTION W/ INTRAOCULAR LENS  IMPLANT, BILATERAL Bilateral   . ESOPHAGEAL BANDING N/A 04/01/2018   Procedure: ESOPHAGEAL BANDING;  Surgeon: Otis Brace, MD;  [facility];  Service: Gastroenterology;  Laterality: N/A;  . ESOPHAGEAL BANDING N/A 06/29/2018   Procedure: ESOPHAGEAL BANDING;  Surgeon: Otis Brace, MD;  [facility];  Service: Gastroenterology;  Laterality: N/A;  . ESOPHAGOGASTRODUODENOSCOPY (EGD) WITH PROPOFOL N/A 04/01/2018   Procedure: ESOPHAGOGASTRODUODENOSCOPY (EGD) WITH PROPOFOL;  Surgeon: Otis Brace, MD;  [facility];  Service: Gastroenterology;  Laterality: N/A;  . ESOPHAGOGASTRODUODENOSCOPY  (EGD) WITH PROPOFOL N/A 06/29/2018   Procedure: ESOPHAGOGASTRODUODENOSCOPY (EGD) WITH PROPOFOL;  Surgeon: Otis Brace, MD;  [facility];  Service: Gastroenterology;  Laterality: N/A;  . ESOPHAGOGASTRODUODENOSCOPY (EGD) WITH PROPOFOL N/A 09/30/2018   Procedure: ESOPHAGOGASTRODUODENOSCOPY (EGD) WITH PROPOFOL;  Surgeon: Otis Brace, MD;  [facility];  Service: Gastroenterology;  Laterality: N/A;  . I&D EXTREMITY Left 02/18/2018   Procedure: IRRIGATION AND DEBRIDEMENT LEFT ARM;  Surgeon: Serafina Mitchell, MD;  Location: [employer];  Service: Vascular;  Laterality: Left;  . INSERTION OF DIALYSIS CATHETER N/A 01/19/2018   Procedure: INSERTION OF DIALYSIS CATHETER - RIGHT INTERNAL JUGULAR PLACEMENT;  Surgeon: Rosetta Posner, MD;  Location: [place];  Service: Vascular;  Laterality: N/A;  . IR FLUORO GUIDE CV LINE RIGHT  11/10/2017  . IR PARACENTESIS  12/10/2017  . IR PARACENTESIS  12/26/2017  . IR REMOVAL TUN CV CATH W/O FL  11/28/2017  . IR US GUIDE VASC ACCESS RIGHT  11/10/2017  . NECK SURGERY  2007   "back of neck; no hardware in there"    Family History  Problem Relation Age of Onset  . Diabetes Mellitus II Sister   . Diabetes Sister   . Stroke Brother   . Heart attack Brother     Social History:  reports that he  has never smoked. He has never used smokeless tobacco. He reports that he does not drink alcohol or use drugs.   Review of Systems   Lipid history: Baseline LDL 136, also has family history of CAD, LDL is at goal with Crestor 5 mg    Lab Results  Component Value Date   CHOL 150 05/21/2018   HDL 44.80 05/21/2018   [place] 93 05/21/2018   TRIG 61.0 05/21/2018   CHOLHDL 3 05/21/2018           He reportedly has cirrhosis of the liver from unknown cause  Lab Results  Component Value Date   ALT 21 05/21/2018   ALT 16 01/07/2018   Hypertension: Has been present for several years managed by nephrologist  BP Readings from Last 3 Encounters:  10/06/18  140/70  09/30/18 125/60  08/24/18 (!) 190/100    Most recent eye exam was in 7/19  Most recent foot exam: 9/19  Currently known complications of diabetes: Nephropathy?,  Unknown status of retinopathy  He is asking about an open area on his left great toe for the last 3 weeks  On dialysis since about 5/19  LABS:  No visits with results within 1 Week(s) from this visit.  Latest known visit with results is:  Office Visit on 10/06/2018  Component Date Value Ref Range Status  . Hemoglobin A1C 10/06/2018 7.6* 4.0 - 5.6 % Final    Physical Examination:  There were no vitals taken for this visit.  Distal left great toe has a remnant blister which is open and with dark edges no exudate, also has callused area surrounding this No significant redness or warmth to the toe Distal toes feel warm and no cyanosis no significant ankle edema     ASSESSMENT:  Diabetes type 2, insulin-dependent with poor control  See history of present illness for detailed discussion of current diabetes management, blood sugar patterns and problems identified  His A1c is 7.7 This is likely to be falsely low because of his renal failure  Although he has been instructed on taking his insulin consistently and having balanced meals his blood sugars are very labile Discussed above difficult to know whether this is related to compliance The last 1-2 days his blood sugars have been excellent except high when he forgot his lunchtime dose yesterday indicating that blood sugars do better with improved compliance Not doing readings after meals especially since he is eating dinner late at night Since fasting blood sugars are relatively stable and not consistently high will not increase Toujeo  He does need more diabetes education and has not made appointment with dietitian  Left great toe noninfected blister and callus: He needs to be seen by podiatrist, currently does not appear to have cellulitis   PLAN:    Needs to take the Novolin R insulin consistently before each meal and try to take 30 minutes before eating  No change in Toujeo To have protein with every meal To check some readings after supper also Will refer to nutritionist for evaluation of his meal planning and with adjusting his insulin based on his meal sizes and carbohydrate intake  There are no Patient Instructions on file for this visit.   Total visit time for evaluation and management of multiple problems and counseling =25 minutes  William [name] 01/05/2019, 9:21 PM   Note: This office note was prepared with Dragon voice recognition system technology. Any transcriptional errors that result from this process are unintentional.

## 2019-01-06 ENCOUNTER — Ambulatory Visit: Payer: Medicare Other | Admitting: Endocrinology

## 2019-01-07 DIAGNOSIS — D631 Anemia in chronic kidney disease: Secondary | ICD-10-CM | POA: Diagnosis not present

## 2019-01-07 DIAGNOSIS — N186 End stage renal disease: Secondary | ICD-10-CM | POA: Diagnosis not present

## 2019-01-07 DIAGNOSIS — N2581 Secondary hyperparathyroidism of renal origin: Secondary | ICD-10-CM | POA: Diagnosis not present

## 2019-01-07 DIAGNOSIS — D509 Iron deficiency anemia, unspecified: Secondary | ICD-10-CM | POA: Diagnosis not present

## 2019-01-09 DIAGNOSIS — N186 End stage renal disease: Secondary | ICD-10-CM | POA: Diagnosis not present

## 2019-01-09 DIAGNOSIS — D631 Anemia in chronic kidney disease: Secondary | ICD-10-CM | POA: Diagnosis not present

## 2019-01-09 DIAGNOSIS — N2581 Secondary hyperparathyroidism of renal origin: Secondary | ICD-10-CM | POA: Diagnosis not present

## 2019-01-09 DIAGNOSIS — D509 Iron deficiency anemia, unspecified: Secondary | ICD-10-CM | POA: Diagnosis not present

## 2019-01-11 ENCOUNTER — Telehealth: Payer: Self-pay | Admitting: Emergency Medicine

## 2019-01-11 NOTE — Telephone Encounter (Signed)
Pt's son calling to report he (son) tested positive for covid-19, resulted today. States he is caregiver for pt, requested advise on precautions. Reviewed precautions per protocols, verbalizes understanding. Also requesting testing for pt. States pt is asymptomatic but did have LGT few days ago. After hours call.  Please advise: [phone]

## 2019-01-12 ENCOUNTER — Telehealth: Payer: Self-pay

## 2019-01-12 ENCOUNTER — Telehealth: Payer: Self-pay | Admitting: Emergency Medicine

## 2019-01-12 DIAGNOSIS — D631 Anemia in chronic kidney disease: Secondary | ICD-10-CM | POA: Diagnosis not present

## 2019-01-12 DIAGNOSIS — N186 End stage renal disease: Secondary | ICD-10-CM | POA: Diagnosis not present

## 2019-01-12 DIAGNOSIS — Z20822 Contact with and (suspected) exposure to covid-19: Secondary | ICD-10-CM

## 2019-01-12 DIAGNOSIS — N2581 Secondary hyperparathyroidism of renal origin: Secondary | ICD-10-CM | POA: Diagnosis not present

## 2019-01-12 DIAGNOSIS — D509 Iron deficiency anemia, unspecified: Secondary | ICD-10-CM | POA: Diagnosis not present

## 2019-01-12 NOTE — Telephone Encounter (Signed)
Patient called and his son [name] answered, who is on the DPR, advised of the request for covid testing. Appointment scheduled for tomorrow, 01/13/19 at [facility] at 1430, advised of location and to wear a mask for everyone in the vehicle, he verbalized understanding. Order placed.

## 2019-01-12 NOTE — Telephone Encounter (Signed)
Please schedule pt for COVID-19 testing based on direct contact/caretaker for confirmed positive.

## 2019-01-12 NOTE — Telephone Encounter (Signed)
Scheduled for tomorrow.

## 2019-01-12 NOTE — Telephone Encounter (Signed)
Copied from [place] [id number]. Topic: General - Other >> Jan 12, 2019 10:56 AM Carolyn Stare wrote:  Pt son has tested positive for Covid and now has a cough and is asking if he need to be test for Covid

## 2019-01-12 NOTE — Telephone Encounter (Signed)
Please advise, is there anything you want me to tell this pt?

## 2019-01-12 NOTE — Telephone Encounter (Signed)
It is safe to assume that he is developing a COVID infection given his close exposure and symptoms.  At this point he needs to treat the symptoms and quarantine himself.  However, given his chronic medical problems, he is considered high risk and should probably be tested.  If his symptoms get worse and clinical condition deteriorates he must go to the hospital.  Thanks.

## 2019-01-13 ENCOUNTER — Other Ambulatory Visit: Payer: Medicare Other

## 2019-01-13 DIAGNOSIS — Z20822 Contact with and (suspected) exposure to covid-19: Secondary | ICD-10-CM

## 2019-01-13 DIAGNOSIS — R6889 Other general symptoms and signs: Secondary | ICD-10-CM | POA: Diagnosis not present

## 2019-01-14 DIAGNOSIS — N186 End stage renal disease: Secondary | ICD-10-CM | POA: Diagnosis not present

## 2019-01-14 DIAGNOSIS — N2581 Secondary hyperparathyroidism of renal origin: Secondary | ICD-10-CM | POA: Diagnosis not present

## 2019-01-14 DIAGNOSIS — D509 Iron deficiency anemia, unspecified: Secondary | ICD-10-CM | POA: Diagnosis not present

## 2019-01-14 DIAGNOSIS — D631 Anemia in chronic kidney disease: Secondary | ICD-10-CM | POA: Diagnosis not present

## 2019-01-15 ENCOUNTER — Telehealth: Payer: Self-pay | Admitting: *Deleted

## 2019-01-15 DIAGNOSIS — N2581 Secondary hyperparathyroidism of renal origin: Secondary | ICD-10-CM | POA: Diagnosis not present

## 2019-01-15 DIAGNOSIS — N186 End stage renal disease: Secondary | ICD-10-CM | POA: Diagnosis not present

## 2019-01-15 DIAGNOSIS — D631 Anemia in chronic kidney disease: Secondary | ICD-10-CM | POA: Diagnosis not present

## 2019-01-15 DIAGNOSIS — D509 Iron deficiency anemia, unspecified: Secondary | ICD-10-CM | POA: Diagnosis not present

## 2019-01-15 NOTE — Telephone Encounter (Signed)
I called Primary Care at [facility] for Dr Mitchel Honour and spoke with [facility].  Dr Mitchel Honour is not in today.  She requested I send a note over and she would give it to the clinical team making them aware his COVID-19 is positive.  I sent note to Primary Care at [facility] high priority  letting know he is positive and needs to be contacted.

## 2019-01-18 DIAGNOSIS — U071 COVID-19: Secondary | ICD-10-CM | POA: Diagnosis not present

## 2019-01-18 DIAGNOSIS — D631 Anemia in chronic kidney disease: Secondary | ICD-10-CM | POA: Diagnosis not present

## 2019-01-18 DIAGNOSIS — D509 Iron deficiency anemia, unspecified: Secondary | ICD-10-CM | POA: Diagnosis not present

## 2019-01-18 DIAGNOSIS — N2581 Secondary hyperparathyroidism of renal origin: Secondary | ICD-10-CM | POA: Diagnosis not present

## 2019-01-18 DIAGNOSIS — N186 End stage renal disease: Secondary | ICD-10-CM | POA: Diagnosis not present

## 2019-01-18 LAB — NOVEL CORONAVIRUS, NAA: SARS-CoV-2, NAA: DETECTED — AB

## 2019-01-18 NOTE — Telephone Encounter (Signed)
Please advise 

## 2019-01-19 NOTE — Telephone Encounter (Signed)
He needs to be contacted and told about results.  He is a high risk patient and should be advised to go to the emergency room if condition worsens.  Otherwise should remain in quarantine at home.

## 2019-01-20 DIAGNOSIS — N2581 Secondary hyperparathyroidism of renal origin: Secondary | ICD-10-CM | POA: Diagnosis not present

## 2019-01-20 DIAGNOSIS — D509 Iron deficiency anemia, unspecified: Secondary | ICD-10-CM | POA: Diagnosis not present

## 2019-01-20 DIAGNOSIS — D631 Anemia in chronic kidney disease: Secondary | ICD-10-CM | POA: Diagnosis not present

## 2019-01-20 DIAGNOSIS — N186 End stage renal disease: Secondary | ICD-10-CM | POA: Diagnosis not present

## 2019-01-21 NOTE — Telephone Encounter (Signed)
Called pt and informed him of results, he verbalized understanding. He was advised to quarantine for 14 days and to continue to check tempeture. Also advise to go to the ER if he notice any shob, chest pain,cough, or high fever. He verbalized understanding.

## 2019-01-22 DIAGNOSIS — N186 End stage renal disease: Secondary | ICD-10-CM | POA: Diagnosis not present

## 2019-01-22 DIAGNOSIS — D509 Iron deficiency anemia, unspecified: Secondary | ICD-10-CM | POA: Diagnosis not present

## 2019-01-22 DIAGNOSIS — N2581 Secondary hyperparathyroidism of renal origin: Secondary | ICD-10-CM | POA: Diagnosis not present

## 2019-01-22 DIAGNOSIS — D631 Anemia in chronic kidney disease: Secondary | ICD-10-CM | POA: Diagnosis not present

## 2019-01-25 DIAGNOSIS — N2581 Secondary hyperparathyroidism of renal origin: Secondary | ICD-10-CM | POA: Diagnosis not present

## 2019-01-25 DIAGNOSIS — D509 Iron deficiency anemia, unspecified: Secondary | ICD-10-CM | POA: Diagnosis not present

## 2019-01-25 DIAGNOSIS — N186 End stage renal disease: Secondary | ICD-10-CM | POA: Diagnosis not present

## 2019-01-25 DIAGNOSIS — U071 COVID-19: Secondary | ICD-10-CM | POA: Diagnosis not present

## 2019-01-25 DIAGNOSIS — D631 Anemia in chronic kidney disease: Secondary | ICD-10-CM | POA: Diagnosis not present

## 2019-01-27 DIAGNOSIS — N2581 Secondary hyperparathyroidism of renal origin: Secondary | ICD-10-CM | POA: Diagnosis not present

## 2019-01-27 DIAGNOSIS — D509 Iron deficiency anemia, unspecified: Secondary | ICD-10-CM | POA: Diagnosis not present

## 2019-01-27 DIAGNOSIS — D631 Anemia in chronic kidney disease: Secondary | ICD-10-CM | POA: Diagnosis not present

## 2019-01-27 DIAGNOSIS — U071 COVID-19: Secondary | ICD-10-CM | POA: Diagnosis not present

## 2019-01-27 DIAGNOSIS — N186 End stage renal disease: Secondary | ICD-10-CM | POA: Diagnosis not present

## 2019-01-29 DIAGNOSIS — N2581 Secondary hyperparathyroidism of renal origin: Secondary | ICD-10-CM | POA: Diagnosis not present

## 2019-01-29 DIAGNOSIS — D509 Iron deficiency anemia, unspecified: Secondary | ICD-10-CM | POA: Diagnosis not present

## 2019-01-29 DIAGNOSIS — N186 End stage renal disease: Secondary | ICD-10-CM | POA: Diagnosis not present

## 2019-01-29 DIAGNOSIS — D631 Anemia in chronic kidney disease: Secondary | ICD-10-CM | POA: Diagnosis not present

## 2019-02-01 DIAGNOSIS — D631 Anemia in chronic kidney disease: Secondary | ICD-10-CM | POA: Diagnosis not present

## 2019-02-01 DIAGNOSIS — N186 End stage renal disease: Secondary | ICD-10-CM | POA: Diagnosis not present

## 2019-02-01 DIAGNOSIS — U071 COVID-19: Secondary | ICD-10-CM | POA: Diagnosis not present

## 2019-02-01 DIAGNOSIS — N2581 Secondary hyperparathyroidism of renal origin: Secondary | ICD-10-CM | POA: Diagnosis not present

## 2019-02-01 DIAGNOSIS — D509 Iron deficiency anemia, unspecified: Secondary | ICD-10-CM | POA: Diagnosis not present

## 2019-02-03 DIAGNOSIS — Z992 Dependence on renal dialysis: Secondary | ICD-10-CM | POA: Diagnosis not present

## 2019-02-03 DIAGNOSIS — U071 COVID-19: Secondary | ICD-10-CM | POA: Diagnosis not present

## 2019-02-03 DIAGNOSIS — D631 Anemia in chronic kidney disease: Secondary | ICD-10-CM | POA: Diagnosis not present

## 2019-02-03 DIAGNOSIS — N186 End stage renal disease: Secondary | ICD-10-CM | POA: Diagnosis not present

## 2019-02-03 DIAGNOSIS — E1122 Type 2 diabetes mellitus with diabetic chronic kidney disease: Secondary | ICD-10-CM | POA: Diagnosis not present

## 2019-02-03 DIAGNOSIS — N2581 Secondary hyperparathyroidism of renal origin: Secondary | ICD-10-CM | POA: Diagnosis not present

## 2019-02-03 DIAGNOSIS — D509 Iron deficiency anemia, unspecified: Secondary | ICD-10-CM | POA: Diagnosis not present

## 2019-02-04 ENCOUNTER — Telehealth: Payer: Self-pay | Admitting: Emergency Medicine

## 2019-02-04 NOTE — Telephone Encounter (Signed)
Medication Refill - Medication: carvedilol (COREG) 3.125 MG tablet    Has the patient contacted their pharmacy? Yes.   (Agent: If no, request that the patient contact the pharmacy for the refill.) (Agent: If yes, when and what did the pharmacy advise?)  Preferred Pharmacy (with phone number or street name):  [facility] [address]  [place]. [place] [address]  Phone: [phone] Fax: [phone]     Agent: Please be advised that RX refills may take up to 3 business days. We ask that you follow-up with your pharmacy.

## 2019-02-05 DIAGNOSIS — D631 Anemia in chronic kidney disease: Secondary | ICD-10-CM | POA: Diagnosis not present

## 2019-02-05 DIAGNOSIS — N2581 Secondary hyperparathyroidism of renal origin: Secondary | ICD-10-CM | POA: Diagnosis not present

## 2019-02-05 DIAGNOSIS — N186 End stage renal disease: Secondary | ICD-10-CM | POA: Diagnosis not present

## 2019-02-05 DIAGNOSIS — D509 Iron deficiency anemia, unspecified: Secondary | ICD-10-CM | POA: Diagnosis not present

## 2019-02-08 ENCOUNTER — Other Ambulatory Visit: Payer: Self-pay

## 2019-02-08 DIAGNOSIS — N186 End stage renal disease: Secondary | ICD-10-CM | POA: Diagnosis not present

## 2019-02-08 DIAGNOSIS — U071 COVID-19: Secondary | ICD-10-CM | POA: Diagnosis not present

## 2019-02-08 DIAGNOSIS — D631 Anemia in chronic kidney disease: Secondary | ICD-10-CM | POA: Diagnosis not present

## 2019-02-08 DIAGNOSIS — D509 Iron deficiency anemia, unspecified: Secondary | ICD-10-CM | POA: Diagnosis not present

## 2019-02-08 DIAGNOSIS — N2581 Secondary hyperparathyroidism of renal origin: Secondary | ICD-10-CM | POA: Diagnosis not present

## 2019-02-08 MED ORDER — CARVEDILOL 3.125 MG PO TABS: 3.1250 mg | ORAL_TABLET | Freq: Two times a day (BID) | ORAL | 0 refills | Status: DC

## 2019-02-08 NOTE — Telephone Encounter (Signed)
Rx sent to pharmacy   

## 2019-02-10 DIAGNOSIS — N186 End stage renal disease: Secondary | ICD-10-CM | POA: Diagnosis not present

## 2019-02-10 DIAGNOSIS — D509 Iron deficiency anemia, unspecified: Secondary | ICD-10-CM | POA: Diagnosis not present

## 2019-02-10 DIAGNOSIS — U071 COVID-19: Secondary | ICD-10-CM | POA: Diagnosis not present

## 2019-02-10 DIAGNOSIS — N2581 Secondary hyperparathyroidism of renal origin: Secondary | ICD-10-CM | POA: Diagnosis not present

## 2019-02-10 DIAGNOSIS — D631 Anemia in chronic kidney disease: Secondary | ICD-10-CM | POA: Diagnosis not present

## 2019-02-12 DIAGNOSIS — N2581 Secondary hyperparathyroidism of renal origin: Secondary | ICD-10-CM | POA: Diagnosis not present

## 2019-02-12 DIAGNOSIS — D509 Iron deficiency anemia, unspecified: Secondary | ICD-10-CM | POA: Diagnosis not present

## 2019-02-12 DIAGNOSIS — D631 Anemia in chronic kidney disease: Secondary | ICD-10-CM | POA: Diagnosis not present

## 2019-02-12 DIAGNOSIS — N186 End stage renal disease: Secondary | ICD-10-CM | POA: Diagnosis not present

## 2019-02-15 DIAGNOSIS — N186 End stage renal disease: Secondary | ICD-10-CM | POA: Diagnosis not present

## 2019-02-15 DIAGNOSIS — N2581 Secondary hyperparathyroidism of renal origin: Secondary | ICD-10-CM | POA: Diagnosis not present

## 2019-02-15 DIAGNOSIS — D509 Iron deficiency anemia, unspecified: Secondary | ICD-10-CM | POA: Diagnosis not present

## 2019-02-15 DIAGNOSIS — D631 Anemia in chronic kidney disease: Secondary | ICD-10-CM | POA: Diagnosis not present

## 2019-02-16 DIAGNOSIS — K766 Portal hypertension: Secondary | ICD-10-CM | POA: Diagnosis not present

## 2019-02-16 DIAGNOSIS — I509 Heart failure, unspecified: Secondary | ICD-10-CM | POA: Diagnosis not present

## 2019-02-16 DIAGNOSIS — N186 End stage renal disease: Secondary | ICD-10-CM | POA: Diagnosis not present

## 2019-02-16 DIAGNOSIS — K7581 Nonalcoholic steatohepatitis (NASH): Secondary | ICD-10-CM | POA: Diagnosis not present

## 2019-02-16 DIAGNOSIS — M310 Hypersensitivity angiitis: Secondary | ICD-10-CM | POA: Diagnosis not present

## 2019-02-16 DIAGNOSIS — Z992 Dependence on renal dialysis: Secondary | ICD-10-CM | POA: Diagnosis not present

## 2019-02-16 DIAGNOSIS — R932 Abnormal findings on diagnostic imaging of liver and biliary tract: Secondary | ICD-10-CM | POA: Diagnosis not present

## 2019-02-16 DIAGNOSIS — K7469 Other cirrhosis of liver: Secondary | ICD-10-CM | POA: Diagnosis not present

## 2019-02-16 DIAGNOSIS — E1122 Type 2 diabetes mellitus with diabetic chronic kidney disease: Secondary | ICD-10-CM | POA: Diagnosis not present

## 2019-02-16 DIAGNOSIS — R188 Other ascites: Secondary | ICD-10-CM | POA: Diagnosis not present

## 2019-02-16 DIAGNOSIS — Z79899 Other long term (current) drug therapy: Secondary | ICD-10-CM | POA: Diagnosis not present

## 2019-02-16 DIAGNOSIS — I132 Hypertensive heart and chronic kidney disease with heart failure and with stage 5 chronic kidney disease, or end stage renal disease: Secondary | ICD-10-CM | POA: Diagnosis not present

## 2019-02-16 DIAGNOSIS — Z01818 Encounter for other preprocedural examination: Secondary | ICD-10-CM | POA: Diagnosis not present

## 2019-02-17 IMAGING — US US ABDOMEN LIMITED
1 series · 9 of 9 positions shown · non-contrast
Comparison: CT 12/19/2017

CLINICAL DATA: Abdominal distension, check for ascites

EXAM:
LIMITED ABDOMEN ULTRASOUND FOR ASCITES
TECHNIQUE: Limited ultrasound survey for ascites was performed in all four
abdominal quadrants.

[Series 1: us abdomen limited · 0.30mm/px · 9 of 9 slices shown]
[im 1/9]
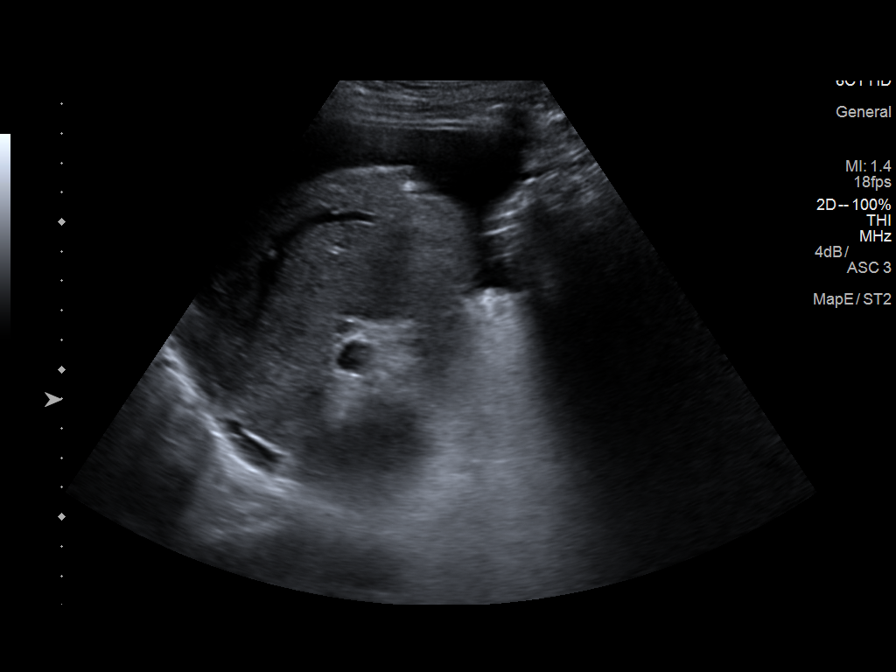
[im 2/9]
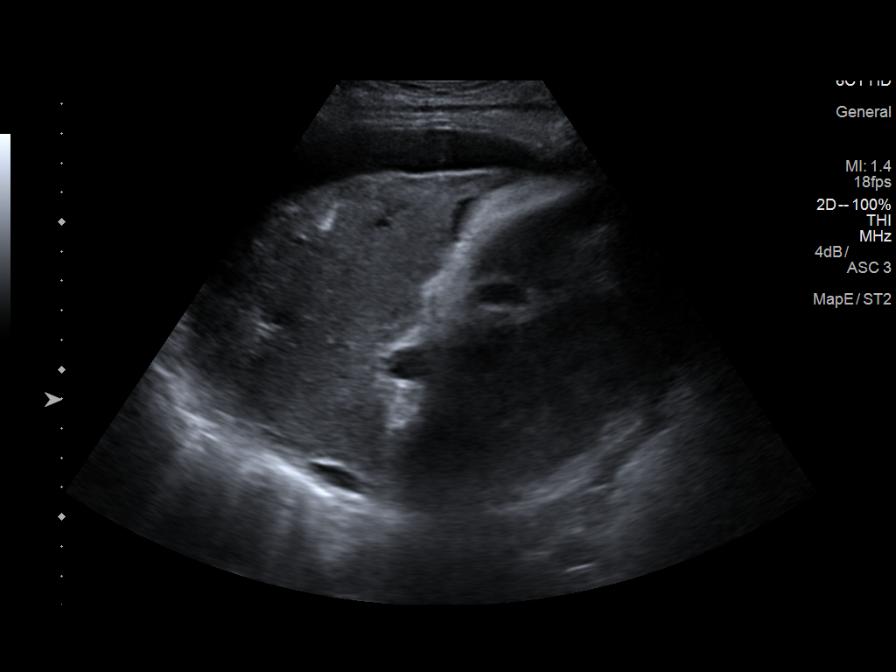
[im 3/9]
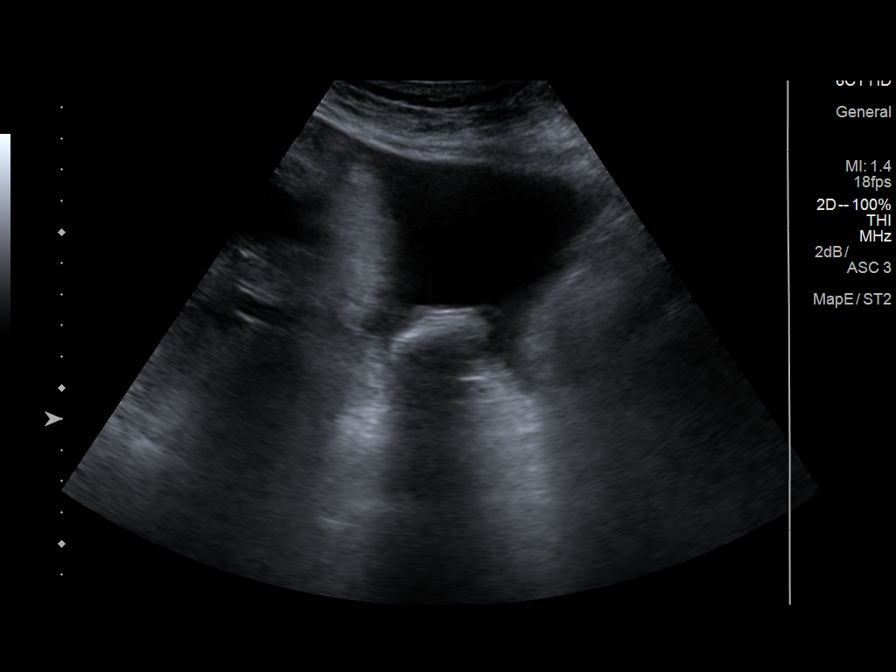
[im 4/9]
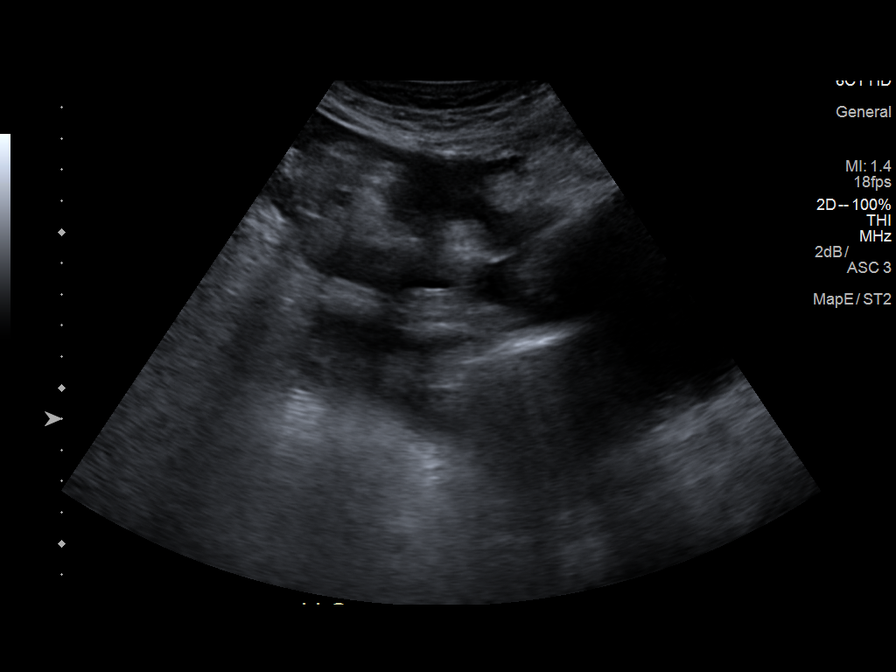
[im 5/9]
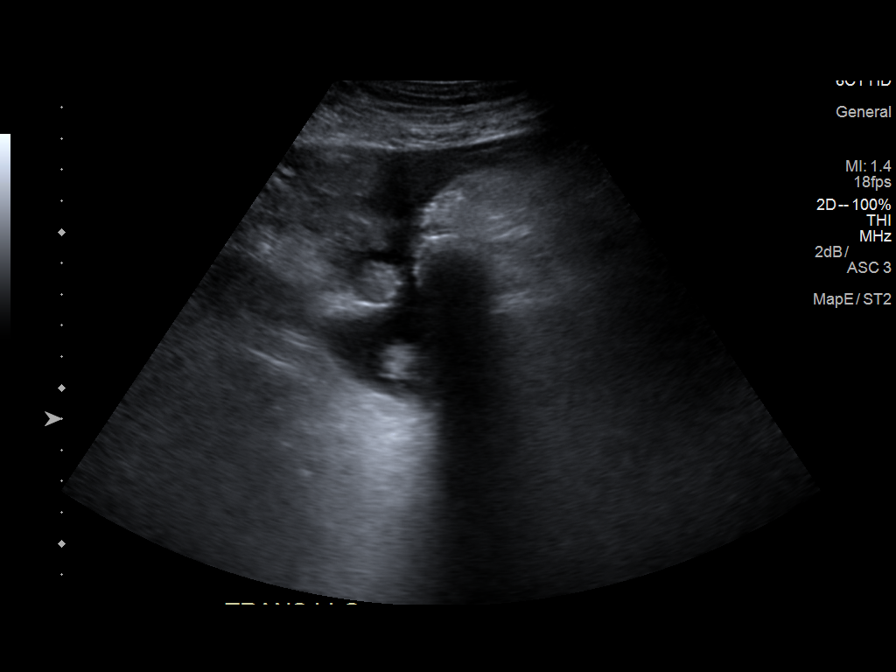
[im 6/9]
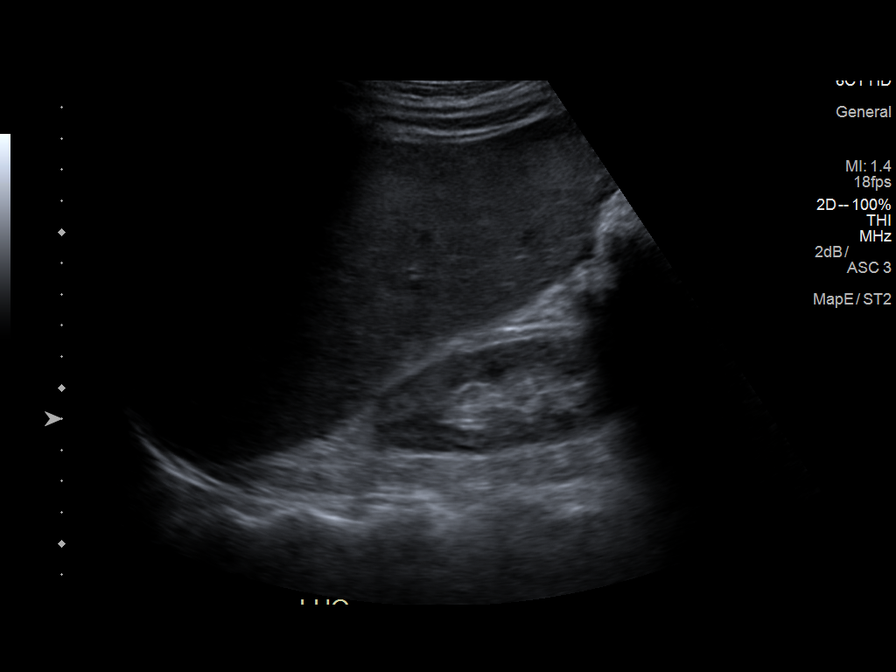
[im 7/9]
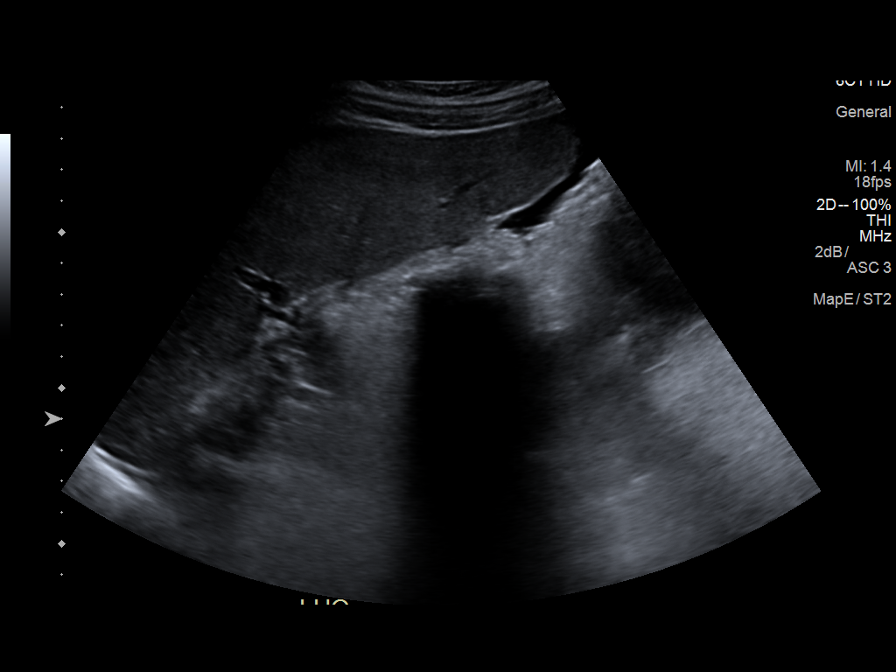
[im 8/9]
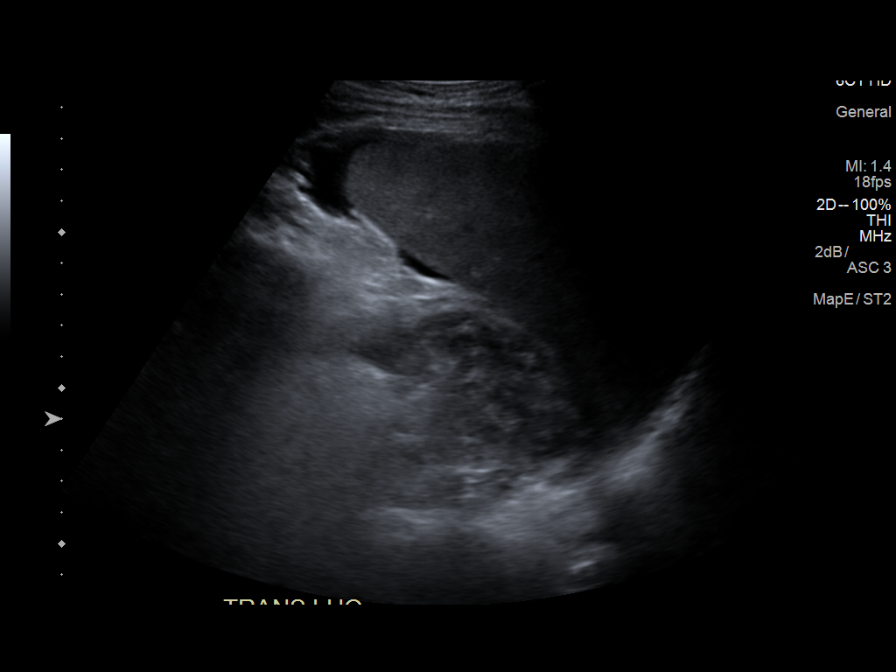
[im 9/9]
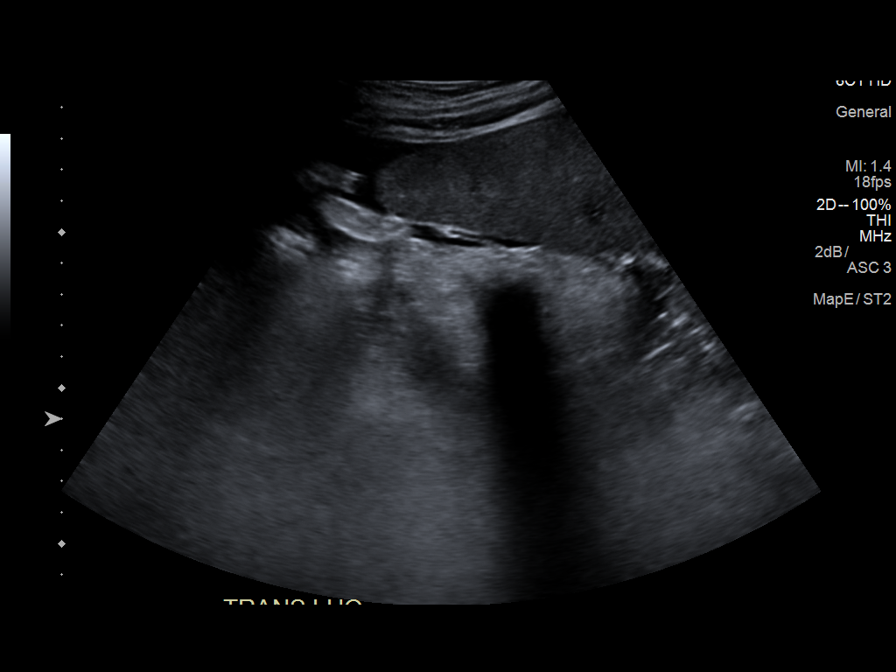

[9 of 9 positions shown; findings below may reference images not displayed]

FINDINGS: Mild to moderate ascites within the abdomen with large is fluid
pocket seen in the right lower quadrant. Cirrhotic morphology of the
liver
IMPRESSION: Mild to moderate abdominal ascites

## 2019-02-18 DIAGNOSIS — D509 Iron deficiency anemia, unspecified: Secondary | ICD-10-CM | POA: Diagnosis not present

## 2019-02-18 DIAGNOSIS — E1129 Type 2 diabetes mellitus with other diabetic kidney complication: Secondary | ICD-10-CM | POA: Diagnosis not present

## 2019-02-18 DIAGNOSIS — N2581 Secondary hyperparathyroidism of renal origin: Secondary | ICD-10-CM | POA: Diagnosis not present

## 2019-02-18 DIAGNOSIS — N186 End stage renal disease: Secondary | ICD-10-CM | POA: Diagnosis not present

## 2019-02-18 DIAGNOSIS — D631 Anemia in chronic kidney disease: Secondary | ICD-10-CM | POA: Diagnosis not present

## 2019-02-20 DIAGNOSIS — D631 Anemia in chronic kidney disease: Secondary | ICD-10-CM | POA: Diagnosis not present

## 2019-02-20 DIAGNOSIS — N186 End stage renal disease: Secondary | ICD-10-CM | POA: Diagnosis not present

## 2019-02-20 DIAGNOSIS — D509 Iron deficiency anemia, unspecified: Secondary | ICD-10-CM | POA: Diagnosis not present

## 2019-02-20 DIAGNOSIS — N2581 Secondary hyperparathyroidism of renal origin: Secondary | ICD-10-CM | POA: Diagnosis not present

## 2019-02-22 ENCOUNTER — Other Ambulatory Visit: Payer: Self-pay

## 2019-02-22 MED ORDER — TOUJEO MAX SOLOSTAR 300 UNIT/ML ~~LOC~~ SOPN: 14.0000 [IU] | PEN_INJECTOR | Freq: Every day | SUBCUTANEOUS | 1 refills | Status: DC

## 2019-02-23 DIAGNOSIS — N186 End stage renal disease: Secondary | ICD-10-CM | POA: Diagnosis not present

## 2019-02-23 DIAGNOSIS — D631 Anemia in chronic kidney disease: Secondary | ICD-10-CM | POA: Diagnosis not present

## 2019-02-23 DIAGNOSIS — D509 Iron deficiency anemia, unspecified: Secondary | ICD-10-CM | POA: Diagnosis not present

## 2019-02-23 DIAGNOSIS — N2581 Secondary hyperparathyroidism of renal origin: Secondary | ICD-10-CM | POA: Diagnosis not present

## 2019-02-25 DIAGNOSIS — D509 Iron deficiency anemia, unspecified: Secondary | ICD-10-CM | POA: Diagnosis not present

## 2019-02-25 DIAGNOSIS — N2581 Secondary hyperparathyroidism of renal origin: Secondary | ICD-10-CM | POA: Diagnosis not present

## 2019-02-25 DIAGNOSIS — N186 End stage renal disease: Secondary | ICD-10-CM | POA: Diagnosis not present

## 2019-02-25 DIAGNOSIS — D631 Anemia in chronic kidney disease: Secondary | ICD-10-CM | POA: Diagnosis not present

## 2019-02-26 DIAGNOSIS — J849 Interstitial pulmonary disease, unspecified: Secondary | ICD-10-CM | POA: Diagnosis not present

## 2019-02-26 DIAGNOSIS — R05 Cough: Secondary | ICD-10-CM | POA: Diagnosis not present

## 2019-02-27 DIAGNOSIS — D509 Iron deficiency anemia, unspecified: Secondary | ICD-10-CM | POA: Diagnosis not present

## 2019-02-27 DIAGNOSIS — N2581 Secondary hyperparathyroidism of renal origin: Secondary | ICD-10-CM | POA: Diagnosis not present

## 2019-02-27 DIAGNOSIS — N186 End stage renal disease: Secondary | ICD-10-CM | POA: Diagnosis not present

## 2019-02-27 DIAGNOSIS — D631 Anemia in chronic kidney disease: Secondary | ICD-10-CM | POA: Diagnosis not present

## 2019-03-01 ENCOUNTER — Telehealth: Payer: Self-pay

## 2019-03-01 NOTE — Telephone Encounter (Signed)
Patient's insurance is requesting a PA for [employer] Formulary alternatives are Basaglar, Lantus, Levemir, or [place]. Would you like to do the PA or change to another drug?

## 2019-03-01 NOTE — Telephone Encounter (Signed)
Tyler Aas same dose

## 2019-03-02 ENCOUNTER — Other Ambulatory Visit: Payer: Self-pay

## 2019-03-02 DIAGNOSIS — N2581 Secondary hyperparathyroidism of renal origin: Secondary | ICD-10-CM | POA: Diagnosis not present

## 2019-03-02 DIAGNOSIS — N186 End stage renal disease: Secondary | ICD-10-CM | POA: Diagnosis not present

## 2019-03-02 DIAGNOSIS — D631 Anemia in chronic kidney disease: Secondary | ICD-10-CM | POA: Diagnosis not present

## 2019-03-02 DIAGNOSIS — D509 Iron deficiency anemia, unspecified: Secondary | ICD-10-CM | POA: Diagnosis not present

## 2019-03-02 MED ORDER — TRESIBA FLEXTOUCH 100 UNIT/ML ~~LOC~~ SOPN: 14.0000 [IU] | PEN_INJECTOR | Freq: Every day | SUBCUTANEOUS | 3 refills | Status: DC

## 2019-03-02 NOTE — Telephone Encounter (Signed)
U100 or U200 Tresiba?

## 2019-03-02 NOTE — Telephone Encounter (Signed)
Rx sent 

## 2019-03-02 NOTE — Telephone Encounter (Signed)
U-100.  U-200 is use only for patients needing 40 units or more

## 2019-03-03 DIAGNOSIS — I8500 Esophageal varices without bleeding: Secondary | ICD-10-CM | POA: Diagnosis not present

## 2019-03-03 DIAGNOSIS — M310 Hypersensitivity angiitis: Secondary | ICD-10-CM | POA: Diagnosis not present

## 2019-03-03 DIAGNOSIS — R790 Abnormal level of blood mineral: Secondary | ICD-10-CM | POA: Diagnosis not present

## 2019-03-03 DIAGNOSIS — D638 Anemia in other chronic diseases classified elsewhere: Secondary | ICD-10-CM | POA: Diagnosis not present

## 2019-03-03 DIAGNOSIS — Z1211 Encounter for screening for malignant neoplasm of colon: Secondary | ICD-10-CM | POA: Diagnosis not present

## 2019-03-03 DIAGNOSIS — K7460 Unspecified cirrhosis of liver: Secondary | ICD-10-CM | POA: Diagnosis not present

## 2019-03-03 DIAGNOSIS — N186 End stage renal disease: Secondary | ICD-10-CM | POA: Diagnosis not present

## 2019-03-03 DIAGNOSIS — Z992 Dependence on renal dialysis: Secondary | ICD-10-CM | POA: Diagnosis not present

## 2019-03-03 DIAGNOSIS — I864 Gastric varices: Secondary | ICD-10-CM | POA: Diagnosis not present

## 2019-03-04 DIAGNOSIS — N2581 Secondary hyperparathyroidism of renal origin: Secondary | ICD-10-CM | POA: Diagnosis not present

## 2019-03-04 DIAGNOSIS — N186 End stage renal disease: Secondary | ICD-10-CM | POA: Diagnosis not present

## 2019-03-04 DIAGNOSIS — D631 Anemia in chronic kidney disease: Secondary | ICD-10-CM | POA: Diagnosis not present

## 2019-03-04 DIAGNOSIS — D509 Iron deficiency anemia, unspecified: Secondary | ICD-10-CM | POA: Diagnosis not present

## 2019-03-05 ENCOUNTER — Other Ambulatory Visit: Payer: Self-pay | Admitting: Gastroenterology

## 2019-03-05 ENCOUNTER — Other Ambulatory Visit: Payer: Self-pay | Admitting: Emergency Medicine

## 2019-03-05 DIAGNOSIS — R972 Elevated prostate specific antigen [PSA]: Secondary | ICD-10-CM

## 2019-03-05 DIAGNOSIS — K7460 Unspecified cirrhosis of liver: Secondary | ICD-10-CM

## 2019-03-06 DIAGNOSIS — Z23 Encounter for immunization: Secondary | ICD-10-CM | POA: Diagnosis not present

## 2019-03-06 DIAGNOSIS — E1122 Type 2 diabetes mellitus with diabetic chronic kidney disease: Secondary | ICD-10-CM | POA: Diagnosis not present

## 2019-03-06 DIAGNOSIS — Z992 Dependence on renal dialysis: Secondary | ICD-10-CM | POA: Diagnosis not present

## 2019-03-06 DIAGNOSIS — N186 End stage renal disease: Secondary | ICD-10-CM | POA: Diagnosis not present

## 2019-03-06 DIAGNOSIS — D509 Iron deficiency anemia, unspecified: Secondary | ICD-10-CM | POA: Diagnosis not present

## 2019-03-06 DIAGNOSIS — D631 Anemia in chronic kidney disease: Secondary | ICD-10-CM | POA: Diagnosis not present

## 2019-03-06 DIAGNOSIS — N2581 Secondary hyperparathyroidism of renal origin: Secondary | ICD-10-CM | POA: Diagnosis not present

## 2019-03-08 ENCOUNTER — Ambulatory Visit
Admission: RE | Admit: 2019-03-08 | Discharge: 2019-03-08 | Disposition: A | Discharge status: Home / Self Care | Payer: Medicare Other | Source: Ambulatory Visit | Attending: Gastroenterology | Admitting: Gastroenterology

## 2019-03-08 DIAGNOSIS — K7460 Unspecified cirrhosis of liver: Secondary | ICD-10-CM

## 2019-03-09 DIAGNOSIS — N186 End stage renal disease: Secondary | ICD-10-CM | POA: Diagnosis not present

## 2019-03-09 DIAGNOSIS — Z23 Encounter for immunization: Secondary | ICD-10-CM | POA: Diagnosis not present

## 2019-03-09 DIAGNOSIS — D631 Anemia in chronic kidney disease: Secondary | ICD-10-CM | POA: Diagnosis not present

## 2019-03-09 DIAGNOSIS — N2581 Secondary hyperparathyroidism of renal origin: Secondary | ICD-10-CM | POA: Diagnosis not present

## 2019-03-09 DIAGNOSIS — D509 Iron deficiency anemia, unspecified: Secondary | ICD-10-CM | POA: Diagnosis not present

## 2019-03-09 DIAGNOSIS — Z992 Dependence on renal dialysis: Secondary | ICD-10-CM | POA: Diagnosis not present

## 2019-03-11 DIAGNOSIS — N186 End stage renal disease: Secondary | ICD-10-CM | POA: Diagnosis not present

## 2019-03-11 DIAGNOSIS — D509 Iron deficiency anemia, unspecified: Secondary | ICD-10-CM | POA: Diagnosis not present

## 2019-03-11 DIAGNOSIS — N2581 Secondary hyperparathyroidism of renal origin: Secondary | ICD-10-CM | POA: Diagnosis not present

## 2019-03-11 DIAGNOSIS — Z23 Encounter for immunization: Secondary | ICD-10-CM | POA: Diagnosis not present

## 2019-03-11 DIAGNOSIS — Z992 Dependence on renal dialysis: Secondary | ICD-10-CM | POA: Diagnosis not present

## 2019-03-11 DIAGNOSIS — D631 Anemia in chronic kidney disease: Secondary | ICD-10-CM | POA: Diagnosis not present

## 2019-03-13 DIAGNOSIS — N186 End stage renal disease: Secondary | ICD-10-CM | POA: Diagnosis not present

## 2019-03-13 DIAGNOSIS — D631 Anemia in chronic kidney disease: Secondary | ICD-10-CM | POA: Diagnosis not present

## 2019-03-13 DIAGNOSIS — D509 Iron deficiency anemia, unspecified: Secondary | ICD-10-CM | POA: Diagnosis not present

## 2019-03-13 DIAGNOSIS — Z23 Encounter for immunization: Secondary | ICD-10-CM | POA: Diagnosis not present

## 2019-03-13 DIAGNOSIS — Z992 Dependence on renal dialysis: Secondary | ICD-10-CM | POA: Diagnosis not present

## 2019-03-13 DIAGNOSIS — N2581 Secondary hyperparathyroidism of renal origin: Secondary | ICD-10-CM | POA: Diagnosis not present

## 2019-03-16 DIAGNOSIS — Z992 Dependence on renal dialysis: Secondary | ICD-10-CM | POA: Diagnosis not present

## 2019-03-16 DIAGNOSIS — N2581 Secondary hyperparathyroidism of renal origin: Secondary | ICD-10-CM | POA: Diagnosis not present

## 2019-03-16 DIAGNOSIS — Z23 Encounter for immunization: Secondary | ICD-10-CM | POA: Diagnosis not present

## 2019-03-16 DIAGNOSIS — N186 End stage renal disease: Secondary | ICD-10-CM | POA: Diagnosis not present

## 2019-03-16 DIAGNOSIS — D509 Iron deficiency anemia, unspecified: Secondary | ICD-10-CM | POA: Diagnosis not present

## 2019-03-16 DIAGNOSIS — D631 Anemia in chronic kidney disease: Secondary | ICD-10-CM | POA: Diagnosis not present

## 2019-03-17 ENCOUNTER — Encounter: Payer: Self-pay | Admitting: Endocrinology

## 2019-03-17 ENCOUNTER — Telehealth: Payer: Self-pay | Admitting: Endocrinology

## 2019-03-17 ENCOUNTER — Ambulatory Visit (INDEPENDENT_AMBULATORY_CARE_PROVIDER_SITE_OTHER): Payer: Medicare Other | Admitting: Endocrinology

## 2019-03-17 ENCOUNTER — Other Ambulatory Visit: Payer: Self-pay

## 2019-03-17 VITALS — BP 128/60 | HR 70 | Ht 64.0 in | Wt 146.8 lb

## 2019-03-17 DIAGNOSIS — Z794 Long term (current) use of insulin: Secondary | ICD-10-CM

## 2019-03-17 DIAGNOSIS — E7800 Pure hypercholesterolemia, unspecified: Secondary | ICD-10-CM

## 2019-03-17 DIAGNOSIS — E1165 Type 2 diabetes mellitus with hyperglycemia: Secondary | ICD-10-CM | POA: Diagnosis not present

## 2019-03-17 LAB — POCT GLYCOSYLATED HEMOGLOBIN (HGB A1C): Hemoglobin A1C: 6.8 % — AB (ref 4.0–5.6)

## 2019-03-17 MED ORDER — ROSUVASTATIN CALCIUM 5 MG PO TABS: 5.0000 mg | ORAL_TABLET | Freq: Every day | ORAL | 3 refills | Status: DC

## 2019-03-17 NOTE — Telephone Encounter (Signed)
Please let the patient's son know that I am restarting his rosuvastatin for cholesterol, not clear why this has been stopped

## 2019-03-17 NOTE — Patient Instructions (Addendum)
Check sugar before bedtime 2-3 x per week  Take 10 units R insulin at lunch  Check blood sugars on waking up days a week  Also check blood sugars about 2 hours after meals and do this after different meals by rotation  Recommended blood sugar levels on waking up are 90-130 and about 2 hours after meal is 130-180  Please bring your blood sugar monitor to each visit, thank you  Drink only 1/2 Nepro at a time

## 2019-03-17 NOTE — Progress Notes (Signed)
Patient ID: William [name], male   DOB: [birth year], 68 y.o.   MRN: [id number]          Reason for Appointment: Follow-up for Type 2 Diabetes  Referring physician: Dr. Carole Civil   History of Present Illness:          Date of diagnosis of type 2 diabetes mellitus: 1990s       Background history:   He has been on insulin for about 15 years, previously taking unknown oral agents Has been usually taking insulin with a syringe and only one type of premixed insulin Previous A1c has ranged between 6.6 and 9.2 in the last 2 years that records are available  Recent history:    INSULIN regimen is:  Toujeo 14 units in a.m. Novolin R at mealtimes 6-8-8  He has his dialysis 3 times a week at 11 AM  Current management, blood sugar patterns and problems identified:  A1c has come down to 6.8 compared to 7.6  He still has A1c lower than expected for his blood sugars  As before his blood sugars are mostly higher postprandially  His son now says that he will drink a can of the renal nutritional supplement sometimes in the afternoon or during dialysis in the early afternoon and this may possibly be causing his variable readings in the afternoon  Also not clear if he has taken his morning regular insulin consistently when his blood sugars are near normal  HYPERGLYCEMIA is very consistent before dinnertime which is about 6 hours after his lunch generally  As before he is not checking his sugars AFTER his evening meal  The regular insulin is generally given right before eating and not ahead of time  He probably does have some carbohydrate consistently and at times more fruit  His weight is up slightly Previously he could not afford Humalog because of the higher co-pay         Compliance with the medical regimen: Fair     Meal times are:  Breakfast is at 10-11 AM lunch: 4.30 Dinner: 10-11 PM                                                                                                                        Typical meal intake: Breakfast is  an egg, bread and some fruit.  His daughter-in-law is usually doing the cooking        Dinner grits, fruit       Exercise:  Walking 15-30 minutes about every other day in the late afternoon  Glucose monitoring:  done about 3 times a day         Glucometer: brand.     One Touch Verio    Blood Glucose readings by download of monitor   PRE-MEAL Fasting Lunch Dinner Bedtime Overall  Glucose range:  88-205  103-437  176-392 ?   Mean/median: 135  200  240   196   Previous readings:   PRE-MEAL Fasting Lunch Dinner  Bedtime Overall  Glucose range:  95-337   102-601    Mean/median:  185  198  273   220+/-124   POST-MEAL PC Breakfast PC Lunch PC Dinner  Glucose range:     Mean/median:        CDE visit: 05/2018 Dietician visit, most recent: Never except some interaction in the hospital  Weight history:  Wt Readings from Last 3 Encounters:  03/17/19 146 lb 12.8 oz (66.6 kg)  10/06/18 142 lb (64.4 kg)  09/30/18 141 lb 1.5 oz (64 kg)    Glycemic control:   Lab Results  Component Value Date   HGBA1C 6.8 (A) 03/17/2019   HGBA1C 7.6 (A) 10/06/2018   HGBA1C 7.7 (H) 07/09/2018   Lab Results  Component Value Date   MICROALBUR 91.4 08/25/2015   LDLCALC 93 05/21/2018   CREATININE 4.04 ([place]) 03/09/2018   No results found for: [facility]  Lab Results  Component Value Date   FRUCTOSAMINE 524 (H) 05/21/2018    Office Visit on 03/17/2019  Component Date Value Ref Range Status  . Hemoglobin A1C 03/17/2019 6.8* 4.0 - 5.6 % Final    Allergies as of 03/17/2019      Reactions   Heparin Other (See Comments)   Patient is Muslim and is not permitted Pork derative due to religious belief)   Pork-derived Products Other (See Comments)   Patient does not eat pork due to religious beliefs      Medication List       Accurate as of March 17, 2019  8:54 PM. If you have any questions, ask your nurse or doctor.         acetaminophen 650 MG CR tablet Commonly known as: TYLENOL Take 650 mg by mouth every 8 (eight) hours as needed for pain.   amLODipine 10 MG tablet Commonly known as: Norvasc Take 1 tablet (10 mg total) by mouth daily. What changed: additional instructions   calcium acetate 667 MG capsule Commonly known as: PHOSLO Take 2 capsules (1,334 mg total) by mouth 3 (three) times daily with meals.   carvedilol 3.125 MG tablet Commonly known as: COREG Take 1 tablet (3.125 mg total) by mouth 2 (two) times daily.   glucose blood test strip Commonly known as: OneTouch Verio Use to check blood sugar 3 times a day dx e11.65   Insulin Pen Needle 31G X 5 MM Misc Commonly known as: [place] Insulin Pen Needles Use four times daily to inject insulin   insulin regular 100 units/mL injection Commonly known as: NOVOLIN R Inject 6-8 Units into the skin See admin instructions. INJECT 6 UNITS UNDER THE SKIN AT BREAKFAST, AND 8 UNITS AT LUNCH AND DINNER.   Iron 27 240 (27 FE) MG tablet Generic drug: ferrous gluconate Take 240 mg by mouth daily.   lidocaine-prilocaine cream Commonly known as: EMLA Apply 1 application topically Every Tuesday,Thursday,and Saturday with dialysis.   multivitamin with minerals tablet Take 1 tablet by mouth daily.   mupirocin ointment 2 % Commonly known as: BACTROBAN Apply 1 application topically 2 (two) times daily.   ONE TOUCH LANCETS Misc Use to test blood sugar three times daily   Systane 0.4-0.3 % Soln Generic drug: Polyethyl Glycol-Propyl Glycol Place 1 drop into both eyes 3 (three) times daily as needed (for dry eyes).   [name] 100 UNIT/ML Sopn FlexTouch Pen Generic drug: insulin degludec Inject 0.14 mLs (14 Units total) into the skin daily. Inject 14 units under the skin once daily.  Allergies:  Allergies  Allergen Reactions  . Heparin Other (See Comments)    Patient is Muslim and is not permitted Pork derative due to religious  belief)  . Pork-Derived Products Other (See Comments)    Patient does not eat pork due to religious beliefs    Past Medical History:  Diagnosis Date  . Anemia   . CHF (congestive heart failure) ([place])   . Cirrhosis ([place])   . Cirrhosis ([place])   . CKD (chronic kidney disease), stage IV ([place])   . Goodpasture's disease [facility])    on outpatient plasmapheresis/notes 11/20/2017  . Grade I diastolic dysfunction 53/97/6734  . History of anemia due to chronic kidney disease   . History of blood transfusion X 1   "UGIB; low blood count"  . History of plasmapheresis    "qod" (11/20/2017)  . Hypertension   . Pancytopenia ([place]) 08/20/2017  . Type II diabetes mellitus ([place])     Past Surgical History:  Procedure Laterality Date  . AV FISTULA PLACEMENT Left 12/26/2017   Procedure: LEFT BRACHIOCEPHALIC ARTERIOVENOUS (AV) FISTULA CREATION;  Surgeon: Conrad Ambridge, MD;  Location: [facility];  Service: Vascular;  Laterality: Left;  . BASCILIC VEIN TRANSPOSITION Left 02/16/2018   Procedure: BRACHIOBASILIC VEIN TRANSPOSITION SECOND STAGE;  Surgeon: Conrad Hamden, MD;  Location: [facility];  Service: Vascular;  Laterality: Left;  . CATARACT EXTRACTION W/ INTRAOCULAR LENS  IMPLANT, BILATERAL Bilateral   . ESOPHAGEAL BANDING N/A 04/01/2018   Procedure: ESOPHAGEAL BANDING;  Surgeon: Otis Brace, MD;  [facility];  Service: Gastroenterology;  Laterality: N/A;  . ESOPHAGEAL BANDING N/A 06/29/2018   Procedure: ESOPHAGEAL BANDING;  Surgeon: Otis Brace, MD;  [facility]: [facility];  Service: Gastroenterology;  Laterality: N/A;  . ESOPHAGOGASTRODUODENOSCOPY (EGD) WITH PROPOFOL N/A 04/01/2018   Procedure: ESOPHAGOGASTRODUODENOSCOPY (EGD) WITH PROPOFOL;  Surgeon: Otis Brace, MD;  [facility];  Service: Gastroenterology;  Laterality: N/A;  . ESOPHAGOGASTRODUODENOSCOPY (EGD) WITH PROPOFOL N/A 06/29/2018   Procedure: ESOPHAGOGASTRODUODENOSCOPY (EGD) WITH PROPOFOL;  Surgeon: Otis Brace,  MD;  [facility];  Service: Gastroenterology;  Laterality: N/A;  . ESOPHAGOGASTRODUODENOSCOPY (EGD) WITH PROPOFOL N/A 09/30/2018   Procedure: ESOPHAGOGASTRODUODENOSCOPY (EGD) WITH PROPOFOL;  Surgeon: Otis Brace, MD;  [facility];  Service: Gastroenterology;  Laterality: N/A;  . I&D EXTREMITY Left 02/18/2018   Procedure: IRRIGATION AND DEBRIDEMENT LEFT ARM;  Surgeon: Serafina Mitchell, MD;  Location: [place] [facility];  Service: Vascular;  Laterality: Left;  . INSERTION OF DIALYSIS CATHETER N/A 01/19/2018   Procedure: INSERTION OF DIALYSIS CATHETER - RIGHT INTERNAL JUGULAR PLACEMENT;  Surgeon: Rosetta Posner, MD;  Location: [facility];  Service: Vascular;  Laterality: N/A;  . IR FLUORO GUIDE CV LINE RIGHT  11/10/2017  . IR PARACENTESIS  12/10/2017  . IR PARACENTESIS  12/26/2017  . IR REMOVAL TUN CV CATH W/O FL  11/28/2017  . IR US GUIDE VASC ACCESS RIGHT  11/10/2017  . NECK SURGERY  2007   "back of neck; no hardware in there"    Family History  Problem Relation Age of Onset  . Diabetes Mellitus II Sister   . Diabetes Sister   . Stroke Brother   . Heart attack Brother     Social History:  reports that he has never smoked. He has never used smokeless tobacco. He reports that he does not drink alcohol or use drugs.   Review of Systems   Lipid history: Baseline LDL 136, also has family history of CAD, LDL previously was at goal with Crestor  5 mg Not clear why he has no statin on his medication list currently    Lab Results  Component Value Date   CHOL 150 05/21/2018   HDL 44.80 05/21/2018   LDLCALC 93 05/21/2018   TRIG 61.0 05/21/2018   CHOLHDL 3 05/21/2018           He reportedly has cirrhosis of the liver from unknown cause  Lab Results  Component Value Date   ALT 21 05/21/2018   ALT 16 01/07/2018   Hypertension: Has been present for several years managed by nephrologist  BP Readings from Last 3 Encounters:  03/17/19 128/60  10/06/18 140/70  09/30/18 125/60     Most recent eye exam was in 7/19  Most recent foot exam: 9/19  Currently known complications of diabetes: Nephropathy?,  Unknown status of retinopathy  He is asking about an open area on his left great toe for the last 3 weeks  On dialysis since about 5/19  LABS:  Office Visit on 03/17/2019  Component Date Value Ref Range Status  . Hemoglobin A1C 03/17/2019 6.8* 4.0 - 5.6 % Final    Physical Examination:  BP 128/60 (BP Location: Right Arm, Patient Position: Sitting, Cuff Size: Normal)   Pulse 70   Ht 5' 4"  (1.626 m)   Wt 146 lb 12.8 oz (66.6 kg)   SpO2 98%   BMI 25.20 kg/m     ASSESSMENT:  Diabetes type 2, insulin-dependent with poor control  See history of present illness for detailed discussion of current diabetes management, blood sugar patterns and problems identified  His A1c is 6.8 This is lower than expected for his home blood sugar average of nearly 200 using preprandial readings  As discussed above he continues to have postprandial hyperglycemia However this is related to higher carbohydrate intake, using a nutritional supplement in between meals in the afternoons at times and inadequate regimen of mealtime insulin He appears to be needing much more mealtime coverage compared to basal insulin which is only 14 units Fasting readings are generally fairly good and less variable this time Also no readings being done after dinner  History of hyperlipidemia: Will need to restart his Crestor, not clear why he has not renewed this   PLAN:   Needs to take the Novolin R insulin consistently before each meal and try to take 30 minutes before eating  No change in Toujeo unless fasting readings are consistently below 90 If he is wanting to have his protein supplement would prefer that he have only half a serving at a time and split it up into 2 times a day Increased lunchtime coverage to 10 units regular insulin Also can take additional 2 units if blood sugars are  above 200 He can check some readings at bedtime 3 times a week Discussed blood sugar targets at various times  Follow-up more regularly and will have him come back in 3 months  Patient Instructions  Check sugar before bedtime 2-3 x per week  Take 10 units R insulin at lunch  Check blood sugars on waking up days a week  Also check blood sugars about 2 hours after meals and do this after different meals by rotation  Recommended blood sugar levels on waking up are 90-130 and about 2 hours after meal is 130-180  Please bring your blood sugar monitor to each visit, thank you  Drink only 1/2 Nepro at a time    Total visit time for evaluation and management of multiple problems and  counseling =25 minutes  Elayne Snare 03/17/2019, 8:54 PM   Note: This office note was prepared with Dragon voice recognition system technology. Any transcriptional errors that result from this process are unintentional.

## 2019-03-18 DIAGNOSIS — Z992 Dependence on renal dialysis: Secondary | ICD-10-CM | POA: Diagnosis not present

## 2019-03-18 DIAGNOSIS — Z23 Encounter for immunization: Secondary | ICD-10-CM | POA: Diagnosis not present

## 2019-03-18 DIAGNOSIS — D631 Anemia in chronic kidney disease: Secondary | ICD-10-CM | POA: Diagnosis not present

## 2019-03-18 DIAGNOSIS — D509 Iron deficiency anemia, unspecified: Secondary | ICD-10-CM | POA: Diagnosis not present

## 2019-03-18 DIAGNOSIS — N2581 Secondary hyperparathyroidism of renal origin: Secondary | ICD-10-CM | POA: Diagnosis not present

## 2019-03-18 DIAGNOSIS — N186 End stage renal disease: Secondary | ICD-10-CM | POA: Diagnosis not present

## 2019-03-18 NOTE — Telephone Encounter (Signed)
Called pt's son and informed him of this. Pt's son verbalized understanding.

## 2019-03-20 DIAGNOSIS — D631 Anemia in chronic kidney disease: Secondary | ICD-10-CM | POA: Diagnosis not present

## 2019-03-20 DIAGNOSIS — Z992 Dependence on renal dialysis: Secondary | ICD-10-CM | POA: Diagnosis not present

## 2019-03-20 DIAGNOSIS — D509 Iron deficiency anemia, unspecified: Secondary | ICD-10-CM | POA: Diagnosis not present

## 2019-03-20 DIAGNOSIS — Z23 Encounter for immunization: Secondary | ICD-10-CM | POA: Diagnosis not present

## 2019-03-20 DIAGNOSIS — N2581 Secondary hyperparathyroidism of renal origin: Secondary | ICD-10-CM | POA: Diagnosis not present

## 2019-03-20 DIAGNOSIS — N186 End stage renal disease: Secondary | ICD-10-CM | POA: Diagnosis not present

## 2019-03-23 DIAGNOSIS — N2581 Secondary hyperparathyroidism of renal origin: Secondary | ICD-10-CM | POA: Diagnosis not present

## 2019-03-23 DIAGNOSIS — D631 Anemia in chronic kidney disease: Secondary | ICD-10-CM | POA: Diagnosis not present

## 2019-03-23 DIAGNOSIS — Z992 Dependence on renal dialysis: Secondary | ICD-10-CM | POA: Diagnosis not present

## 2019-03-23 DIAGNOSIS — N186 End stage renal disease: Secondary | ICD-10-CM | POA: Diagnosis not present

## 2019-03-23 DIAGNOSIS — D509 Iron deficiency anemia, unspecified: Secondary | ICD-10-CM | POA: Diagnosis not present

## 2019-03-23 DIAGNOSIS — Z23 Encounter for immunization: Secondary | ICD-10-CM | POA: Diagnosis not present

## 2019-03-25 DIAGNOSIS — D631 Anemia in chronic kidney disease: Secondary | ICD-10-CM | POA: Diagnosis not present

## 2019-03-25 DIAGNOSIS — Z992 Dependence on renal dialysis: Secondary | ICD-10-CM | POA: Diagnosis not present

## 2019-03-25 DIAGNOSIS — N186 End stage renal disease: Secondary | ICD-10-CM | POA: Diagnosis not present

## 2019-03-25 DIAGNOSIS — Z23 Encounter for immunization: Secondary | ICD-10-CM | POA: Diagnosis not present

## 2019-03-25 DIAGNOSIS — D509 Iron deficiency anemia, unspecified: Secondary | ICD-10-CM | POA: Diagnosis not present

## 2019-03-25 DIAGNOSIS — N2581 Secondary hyperparathyroidism of renal origin: Secondary | ICD-10-CM | POA: Diagnosis not present

## 2019-03-27 DIAGNOSIS — Z23 Encounter for immunization: Secondary | ICD-10-CM | POA: Diagnosis not present

## 2019-03-27 DIAGNOSIS — D509 Iron deficiency anemia, unspecified: Secondary | ICD-10-CM | POA: Diagnosis not present

## 2019-03-27 DIAGNOSIS — N186 End stage renal disease: Secondary | ICD-10-CM | POA: Diagnosis not present

## 2019-03-27 DIAGNOSIS — Z992 Dependence on renal dialysis: Secondary | ICD-10-CM | POA: Diagnosis not present

## 2019-03-27 DIAGNOSIS — D631 Anemia in chronic kidney disease: Secondary | ICD-10-CM | POA: Diagnosis not present

## 2019-03-27 DIAGNOSIS — N2581 Secondary hyperparathyroidism of renal origin: Secondary | ICD-10-CM | POA: Diagnosis not present

## 2019-03-29 ENCOUNTER — Encounter: Payer: Self-pay | Admitting: Podiatry

## 2019-03-30 DIAGNOSIS — Z992 Dependence on renal dialysis: Secondary | ICD-10-CM | POA: Diagnosis not present

## 2019-03-30 DIAGNOSIS — Z23 Encounter for immunization: Secondary | ICD-10-CM | POA: Diagnosis not present

## 2019-03-30 DIAGNOSIS — N2581 Secondary hyperparathyroidism of renal origin: Secondary | ICD-10-CM | POA: Diagnosis not present

## 2019-03-30 DIAGNOSIS — D631 Anemia in chronic kidney disease: Secondary | ICD-10-CM | POA: Diagnosis not present

## 2019-03-30 DIAGNOSIS — D509 Iron deficiency anemia, unspecified: Secondary | ICD-10-CM | POA: Diagnosis not present

## 2019-03-30 DIAGNOSIS — N186 End stage renal disease: Secondary | ICD-10-CM | POA: Diagnosis not present

## 2019-04-01 ENCOUNTER — Other Ambulatory Visit: Payer: Self-pay | Admitting: Emergency Medicine

## 2019-04-01 DIAGNOSIS — Z992 Dependence on renal dialysis: Secondary | ICD-10-CM | POA: Diagnosis not present

## 2019-04-01 DIAGNOSIS — N2581 Secondary hyperparathyroidism of renal origin: Secondary | ICD-10-CM | POA: Diagnosis not present

## 2019-04-01 DIAGNOSIS — D509 Iron deficiency anemia, unspecified: Secondary | ICD-10-CM | POA: Diagnosis not present

## 2019-04-01 DIAGNOSIS — D631 Anemia in chronic kidney disease: Secondary | ICD-10-CM | POA: Diagnosis not present

## 2019-04-01 DIAGNOSIS — N186 End stage renal disease: Secondary | ICD-10-CM | POA: Diagnosis not present

## 2019-04-01 DIAGNOSIS — Z23 Encounter for immunization: Secondary | ICD-10-CM | POA: Diagnosis not present

## 2019-04-01 NOTE — Telephone Encounter (Signed)
Requested medication (s) are due for refill today:yes  Requested medication (s) are on the active medication list: yes  Last refill: 02/2019  Future visit scheduled: no  Notes to clinic: review for refill   Requested Prescriptions  Pending Prescriptions Disp Refills   carvedilol (COREG) 3.125 MG tablet [Pharmacy Med Name: Carvedilol 3.125 MG Oral Tablet] 60 tablet 0    Sig: Take 1 tablet by mouth twice daily     Cardiovascular:  Beta Blockers Failed - 04/01/2019 11:31 AM      Failed - Valid encounter within last 6 months    Recent Outpatient Visits          11 months ago Right otitis media, unspecified otitis media type   Primary Care at [facility], [place], MD   1 year ago Type 2 diabetes mellitus with chronic kidney disease on chronic dialysis, with long-term current use of insulin [facility])   Primary Care at [facility], [place], MD             [place] BP in normal range    BP Readings from Last 1 Encounters:  03/17/19 128/60         Passed - Last Heart Rate in normal range    Pulse Readings from Last 1 Encounters:  03/17/19 70

## 2019-04-03 DIAGNOSIS — Z992 Dependence on renal dialysis: Secondary | ICD-10-CM | POA: Diagnosis not present

## 2019-04-03 DIAGNOSIS — N2581 Secondary hyperparathyroidism of renal origin: Secondary | ICD-10-CM | POA: Diagnosis not present

## 2019-04-03 DIAGNOSIS — D509 Iron deficiency anemia, unspecified: Secondary | ICD-10-CM | POA: Diagnosis not present

## 2019-04-03 DIAGNOSIS — Z23 Encounter for immunization: Secondary | ICD-10-CM | POA: Diagnosis not present

## 2019-04-03 DIAGNOSIS — D631 Anemia in chronic kidney disease: Secondary | ICD-10-CM | POA: Diagnosis not present

## 2019-04-03 DIAGNOSIS — N186 End stage renal disease: Secondary | ICD-10-CM | POA: Diagnosis not present

## 2019-04-06 DIAGNOSIS — Z23 Encounter for immunization: Secondary | ICD-10-CM | POA: Diagnosis not present

## 2019-04-06 DIAGNOSIS — Z992 Dependence on renal dialysis: Secondary | ICD-10-CM | POA: Diagnosis not present

## 2019-04-06 DIAGNOSIS — D509 Iron deficiency anemia, unspecified: Secondary | ICD-10-CM | POA: Diagnosis not present

## 2019-04-06 DIAGNOSIS — D631 Anemia in chronic kidney disease: Secondary | ICD-10-CM | POA: Diagnosis not present

## 2019-04-06 DIAGNOSIS — N186 End stage renal disease: Secondary | ICD-10-CM | POA: Diagnosis not present

## 2019-04-06 DIAGNOSIS — N2581 Secondary hyperparathyroidism of renal origin: Secondary | ICD-10-CM | POA: Diagnosis not present

## 2019-04-06 DIAGNOSIS — E1122 Type 2 diabetes mellitus with diabetic chronic kidney disease: Secondary | ICD-10-CM | POA: Diagnosis not present

## 2019-04-07 DIAGNOSIS — R972 Elevated prostate specific antigen [PSA]: Secondary | ICD-10-CM | POA: Diagnosis not present

## 2019-04-08 DIAGNOSIS — D631 Anemia in chronic kidney disease: Secondary | ICD-10-CM | POA: Diagnosis not present

## 2019-04-08 DIAGNOSIS — Z992 Dependence on renal dialysis: Secondary | ICD-10-CM | POA: Diagnosis not present

## 2019-04-08 DIAGNOSIS — D509 Iron deficiency anemia, unspecified: Secondary | ICD-10-CM | POA: Diagnosis not present

## 2019-04-08 DIAGNOSIS — Z23 Encounter for immunization: Secondary | ICD-10-CM | POA: Diagnosis not present

## 2019-04-08 DIAGNOSIS — N2581 Secondary hyperparathyroidism of renal origin: Secondary | ICD-10-CM | POA: Diagnosis not present

## 2019-04-08 DIAGNOSIS — N186 End stage renal disease: Secondary | ICD-10-CM | POA: Diagnosis not present

## 2019-04-10 DIAGNOSIS — Z992 Dependence on renal dialysis: Secondary | ICD-10-CM | POA: Diagnosis not present

## 2019-04-10 DIAGNOSIS — Z23 Encounter for immunization: Secondary | ICD-10-CM | POA: Diagnosis not present

## 2019-04-10 DIAGNOSIS — N186 End stage renal disease: Secondary | ICD-10-CM | POA: Diagnosis not present

## 2019-04-10 DIAGNOSIS — N2581 Secondary hyperparathyroidism of renal origin: Secondary | ICD-10-CM | POA: Diagnosis not present

## 2019-04-10 DIAGNOSIS — D509 Iron deficiency anemia, unspecified: Secondary | ICD-10-CM | POA: Diagnosis not present

## 2019-04-10 DIAGNOSIS — D631 Anemia in chronic kidney disease: Secondary | ICD-10-CM | POA: Diagnosis not present

## 2019-04-13 ENCOUNTER — Ambulatory Visit: Payer: Medicare Other | Admitting: Podiatry

## 2019-04-13 ENCOUNTER — Other Ambulatory Visit: Payer: Self-pay | Admitting: Endocrinology

## 2019-04-13 DIAGNOSIS — D509 Iron deficiency anemia, unspecified: Secondary | ICD-10-CM | POA: Diagnosis not present

## 2019-04-13 DIAGNOSIS — D631 Anemia in chronic kidney disease: Secondary | ICD-10-CM | POA: Diagnosis not present

## 2019-04-13 DIAGNOSIS — Z23 Encounter for immunization: Secondary | ICD-10-CM | POA: Diagnosis not present

## 2019-04-13 DIAGNOSIS — N2581 Secondary hyperparathyroidism of renal origin: Secondary | ICD-10-CM | POA: Diagnosis not present

## 2019-04-13 DIAGNOSIS — N186 End stage renal disease: Secondary | ICD-10-CM | POA: Diagnosis not present

## 2019-04-13 DIAGNOSIS — Z992 Dependence on renal dialysis: Secondary | ICD-10-CM | POA: Diagnosis not present

## 2019-04-14 NOTE — Progress Notes (Signed)
Spoke with patient's son, Maris Berger. Patient speaks Arabic and is blind. His son is working and cannot stay on the phone for 10-15 minutes. Instructed to call [phone] before 5 PM.

## 2019-04-15 DIAGNOSIS — D509 Iron deficiency anemia, unspecified: Secondary | ICD-10-CM | POA: Diagnosis not present

## 2019-04-15 DIAGNOSIS — D631 Anemia in chronic kidney disease: Secondary | ICD-10-CM | POA: Diagnosis not present

## 2019-04-15 DIAGNOSIS — N2581 Secondary hyperparathyroidism of renal origin: Secondary | ICD-10-CM | POA: Diagnosis not present

## 2019-04-15 DIAGNOSIS — N186 End stage renal disease: Secondary | ICD-10-CM | POA: Diagnosis not present

## 2019-04-15 DIAGNOSIS — Z23 Encounter for immunization: Secondary | ICD-10-CM | POA: Diagnosis not present

## 2019-04-15 DIAGNOSIS — Z992 Dependence on renal dialysis: Secondary | ICD-10-CM | POA: Diagnosis not present

## 2019-04-16 ENCOUNTER — Other Ambulatory Visit: Payer: Self-pay

## 2019-04-16 ENCOUNTER — Encounter (HOSPITAL_COMMUNITY): Payer: Self-pay

## 2019-04-16 NOTE — Progress Notes (Signed)
SPOKE W/  [name] (son)     SCREENING SYMPTOMS OF COVID 19:   COUGH-- NO  RUNNY NOSE--- NO  SORE THROAT---NO  NASAL CONGESTION----NO  SNEEZING----NO  SHORTNESS OF BREATH---NO  DIFFICULTY BREATHING---NO  TEMP >100.0 -----NO  UNEXPLAINED BODY ACHES------NO  CHILLS -------- NO  HEADACHES ---------NO  LOSS OF SMELL/ TASTE --------NO    HAVE YOU OR ANY FAMILY MEMBER TRAVELLED PAST 14 DAYS OUT OF THE   COUNTY---NO STATE----NO COUNTRY---NO-  HAVE YOU OR ANY FAMILY MEMBER BEEN EXPOSED TO ANYONE WITH COVID 19? NO

## 2019-04-17 ENCOUNTER — Other Ambulatory Visit (HOSPITAL_COMMUNITY)
Admission: RE | Admit: 2019-04-17 | Discharge: 2019-04-17 | Disposition: A | Discharge status: Home / Self Care | Payer: Medicare Other | Source: Ambulatory Visit | Attending: Gastroenterology | Admitting: Gastroenterology

## 2019-04-17 DIAGNOSIS — Z992 Dependence on renal dialysis: Secondary | ICD-10-CM | POA: Diagnosis not present

## 2019-04-17 DIAGNOSIS — Z20828 Contact with and (suspected) exposure to other viral communicable diseases: Secondary | ICD-10-CM | POA: Insufficient documentation

## 2019-04-17 DIAGNOSIS — N2581 Secondary hyperparathyroidism of renal origin: Secondary | ICD-10-CM | POA: Diagnosis not present

## 2019-04-17 DIAGNOSIS — Z01812 Encounter for preprocedural laboratory examination: Secondary | ICD-10-CM | POA: Insufficient documentation

## 2019-04-17 DIAGNOSIS — N186 End stage renal disease: Secondary | ICD-10-CM | POA: Diagnosis not present

## 2019-04-17 DIAGNOSIS — D631 Anemia in chronic kidney disease: Secondary | ICD-10-CM | POA: Diagnosis not present

## 2019-04-17 DIAGNOSIS — D509 Iron deficiency anemia, unspecified: Secondary | ICD-10-CM | POA: Diagnosis not present

## 2019-04-17 DIAGNOSIS — Z23 Encounter for immunization: Secondary | ICD-10-CM | POA: Diagnosis not present

## 2019-04-18 LAB — NOVEL CORONAVIRUS, NAA (HOSP ORDER, SEND-OUT TO REF LAB; TAT 18-24 HRS): SARS-CoV-2, NAA: NOT DETECTED

## 2019-04-19 DIAGNOSIS — Z794 Long term (current) use of insulin: Secondary | ICD-10-CM | POA: Diagnosis not present

## 2019-04-19 DIAGNOSIS — N186 End stage renal disease: Secondary | ICD-10-CM | POA: Diagnosis not present

## 2019-04-19 DIAGNOSIS — I132 Hypertensive heart and chronic kidney disease with heart failure and with stage 5 chronic kidney disease, or end stage renal disease: Secondary | ICD-10-CM | POA: Diagnosis not present

## 2019-04-19 DIAGNOSIS — N410 Acute prostatitis: Secondary | ICD-10-CM | POA: Diagnosis not present

## 2019-04-19 DIAGNOSIS — E1122 Type 2 diabetes mellitus with diabetic chronic kidney disease: Secondary | ICD-10-CM | POA: Diagnosis not present

## 2019-04-19 DIAGNOSIS — Z20828 Contact with and (suspected) exposure to other viral communicable diseases: Secondary | ICD-10-CM | POA: Diagnosis not present

## 2019-04-19 DIAGNOSIS — R972 Elevated prostate specific antigen [PSA]: Secondary | ICD-10-CM | POA: Diagnosis not present

## 2019-04-19 DIAGNOSIS — N411 Chronic prostatitis: Secondary | ICD-10-CM | POA: Diagnosis not present

## 2019-04-19 DIAGNOSIS — K219 Gastro-esophageal reflux disease without esophagitis: Secondary | ICD-10-CM | POA: Diagnosis not present

## 2019-04-19 DIAGNOSIS — Z5181 Encounter for therapeutic drug level monitoring: Secondary | ICD-10-CM | POA: Diagnosis not present

## 2019-04-19 DIAGNOSIS — Z79899 Other long term (current) drug therapy: Secondary | ICD-10-CM | POA: Diagnosis not present

## 2019-04-19 DIAGNOSIS — Z7682 Awaiting organ transplant status: Secondary | ICD-10-CM | POA: Diagnosis not present

## 2019-04-19 DIAGNOSIS — K7460 Unspecified cirrhosis of liver: Secondary | ICD-10-CM | POA: Diagnosis not present

## 2019-04-19 DIAGNOSIS — D696 Thrombocytopenia, unspecified: Secondary | ICD-10-CM | POA: Diagnosis not present

## 2019-04-19 DIAGNOSIS — I8500 Esophageal varices without bleeding: Secondary | ICD-10-CM | POA: Diagnosis not present

## 2019-04-19 DIAGNOSIS — Z992 Dependence on renal dialysis: Secondary | ICD-10-CM | POA: Diagnosis not present

## 2019-04-19 DIAGNOSIS — M310 Hypersensitivity angiitis: Secondary | ICD-10-CM | POA: Diagnosis not present

## 2019-04-19 DIAGNOSIS — I509 Heart failure, unspecified: Secondary | ICD-10-CM | POA: Diagnosis not present

## 2019-04-20 ENCOUNTER — Ambulatory Visit (HOSPITAL_COMMUNITY): Payer: Medicare Other | Admitting: Certified Registered Nurse Anesthetist

## 2019-04-20 ENCOUNTER — Ambulatory Visit (HOSPITAL_COMMUNITY)
Admission: RE | Admit: 2019-04-20 | Discharge: 2019-04-20 | Disposition: A | Discharge status: Home / Self Care | Payer: Medicare Other | Attending: Gastroenterology | Admitting: Gastroenterology

## 2019-04-20 ENCOUNTER — Other Ambulatory Visit: Payer: Self-pay

## 2019-04-20 ENCOUNTER — Encounter (HOSPITAL_COMMUNITY)
Admission: RE | Disposition: A | Discharge status: Home / Self Care | Payer: Self-pay | Source: Home / Self Care | Attending: Gastroenterology

## 2019-04-20 DIAGNOSIS — N2581 Secondary hyperparathyroidism of renal origin: Secondary | ICD-10-CM | POA: Diagnosis not present

## 2019-04-20 DIAGNOSIS — I8500 Esophageal varices without bleeding: Secondary | ICD-10-CM | POA: Diagnosis not present

## 2019-04-20 DIAGNOSIS — D509 Iron deficiency anemia, unspecified: Secondary | ICD-10-CM | POA: Diagnosis not present

## 2019-04-20 DIAGNOSIS — Z992 Dependence on renal dialysis: Secondary | ICD-10-CM | POA: Insufficient documentation

## 2019-04-20 DIAGNOSIS — I8510 Secondary esophageal varices without bleeding: Secondary | ICD-10-CM | POA: Diagnosis not present

## 2019-04-20 DIAGNOSIS — Z1159 Encounter for screening for other viral diseases: Secondary | ICD-10-CM | POA: Diagnosis not present

## 2019-04-20 DIAGNOSIS — Z794 Long term (current) use of insulin: Secondary | ICD-10-CM | POA: Diagnosis not present

## 2019-04-20 DIAGNOSIS — Z8619 Personal history of other infectious and parasitic diseases: Secondary | ICD-10-CM | POA: Insufficient documentation

## 2019-04-20 DIAGNOSIS — Z79899 Other long term (current) drug therapy: Secondary | ICD-10-CM | POA: Diagnosis not present

## 2019-04-20 DIAGNOSIS — I864 Gastric varices: Secondary | ICD-10-CM | POA: Insufficient documentation

## 2019-04-20 DIAGNOSIS — N186 End stage renal disease: Secondary | ICD-10-CM | POA: Diagnosis not present

## 2019-04-20 DIAGNOSIS — K219 Gastro-esophageal reflux disease without esophagitis: Secondary | ICD-10-CM | POA: Diagnosis not present

## 2019-04-20 DIAGNOSIS — I132 Hypertensive heart and chronic kidney disease with heart failure and with stage 5 chronic kidney disease, or end stage renal disease: Secondary | ICD-10-CM | POA: Insufficient documentation

## 2019-04-20 DIAGNOSIS — E1122 Type 2 diabetes mellitus with diabetic chronic kidney disease: Secondary | ICD-10-CM | POA: Diagnosis not present

## 2019-04-20 DIAGNOSIS — Z23 Encounter for immunization: Secondary | ICD-10-CM | POA: Diagnosis not present

## 2019-04-20 DIAGNOSIS — D631 Anemia in chronic kidney disease: Secondary | ICD-10-CM | POA: Diagnosis not present

## 2019-04-20 HISTORY — DX: Blindness, one eye, unspecified eye: H54.40

## 2019-04-20 HISTORY — PX: ESOPHAGOGASTRODUODENOSCOPY (EGD) WITH PROPOFOL: SHX5813

## 2019-04-20 LAB — POCT I-STAT 4, (NA,K, GLUC, HGB,HCT)
Glucose, Bld: 136 mg/dL — ABNORMAL HIGH (ref 70–99)
HCT: 32 % — ABNORMAL LOW (ref 39.0–52.0)
Hemoglobin: 10.9 g/dL — ABNORMAL LOW (ref 13.0–17.0)
Potassium: 4.9 mmol/L (ref 3.5–5.1)
Sodium: 131 mmol/L — ABNORMAL LOW (ref 135–145)

## 2019-04-20 SURGERY — ESOPHAGOGASTRODUODENOSCOPY (EGD) WITH PROPOFOL
Anesthesia: Monitor Anesthesia Care

## 2019-04-20 MED ORDER — PROPOFOL 10 MG/ML IV BOLUS
INTRAVENOUS | Status: AC
  Filled 2019-04-20: qty 20

## 2019-04-20 MED ORDER — PROPOFOL 500 MG/50ML IV EMUL
INTRAVENOUS | Status: DC | PRN
  Administered 2019-04-20: 100 ug/kg/min via INTRAVENOUS

## 2019-04-20 MED ORDER — PROPOFOL 10 MG/ML IV BOLUS
INTRAVENOUS | Status: DC | PRN
  Administered 2019-04-20 (×5): 10 mg via INTRAVENOUS

## 2019-04-20 MED ORDER — LIDOCAINE 2% (20 MG/ML) 5 ML SYRINGE
INTRAMUSCULAR | Status: DC | PRN
  Administered 2019-04-20: 60 mg via INTRAVENOUS

## 2019-04-20 MED ORDER — SODIUM CHLORIDE 0.9 % IV SOLN
INTRAVENOUS | Status: DC
  Administered 2019-04-20: 08:00:00 via INTRAVENOUS

## 2019-04-20 SURGICAL SUPPLY — 25 items

## 2019-04-20 NOTE — H&P (Signed)
Primary Care Physician:  Horald Pollen, MD Primary Gastroenterologist:  Dr. Alessandra Bevels  Reason for Visit : EGD for variceal band ligation.  HPI: William [name] is a 68 y.o. male with past medical history of end-stage renal disease currently on hemodialysis, history of cryptogenic cirrhosis presented today for EGD for band ligation of esophageal varices.  Last EGD in February 2020 showed small varices.  Band ligation was not performed.  Also has gastroesophageal varices.  He was originally scheduled for EGD and colonoscopy but he did not drink colonoscopy prep   Denies any GI symptoms today. Denies abdominal pain, nausea vomiting. He denies blood in the stool or black stool.   Past Medical History:  Diagnosis Date  . Anemia   . Blind one eye   . CHF (congestive heart failure) ([place])   . Cirrhosis ([place])   . Cirrhosis ([place])   . CKD (chronic kidney disease), stage IV ([place])   . Dialysis patient ([place])   . Goodpasture's disease [facility])    on outpatient plasmapheresis/notes 11/20/2017  . Grade I diastolic dysfunction 25/36/6440  . History of anemia due to chronic kidney disease   . History of blood transfusion X 1   "UGIB; low blood count"  . History of plasmapheresis    "qod" (11/20/2017)  . Hypertension   . Pancytopenia ([place]) 08/20/2017  . Type II diabetes mellitus ([place])     Past Surgical History:  Procedure Laterality Date  . AV FISTULA PLACEMENT Left 12/26/2017   Procedure: LEFT BRACHIOCEPHALIC ARTERIOVENOUS (AV) FISTULA CREATION;  Surgeon: Conrad Westport, MD;  Location: [place];  Service: Vascular;  Laterality: Left;  . BASCILIC VEIN TRANSPOSITION Left 02/16/2018   Procedure: BRACHIOBASILIC VEIN TRANSPOSITION SECOND STAGE;  Surgeon: Conrad Corozal, MD;  Location: [place];  Service: Vascular;  Laterality: Left;  . CATARACT EXTRACTION W/ INTRAOCULAR LENS  IMPLANT, BILATERAL Bilateral   . ESOPHAGEAL BANDING N/A 04/01/2018   Procedure: ESOPHAGEAL BANDING;  Surgeon: Otis Brace,  MD;  [facility];  Service: Gastroenterology;  Laterality: N/A;  . ESOPHAGEAL BANDING N/A 06/29/2018   Procedure: ESOPHAGEAL BANDING;  Surgeon: Otis Brace, MD;  [facility];  Service: Gastroenterology;  Laterality: N/A;  . ESOPHAGOGASTRODUODENOSCOPY (EGD) WITH PROPOFOL N/A 04/01/2018   Procedure: ESOPHAGOGASTRODUODENOSCOPY (EGD) WITH PROPOFOL;  Surgeon: Otis Brace, MD;  [facility];  Service: Gastroenterology;  Laterality: N/A;  . ESOPHAGOGASTRODUODENOSCOPY (EGD) WITH PROPOFOL N/A 06/29/2018   Procedure: ESOPHAGOGASTRODUODENOSCOPY (EGD) WITH PROPOFOL;  Surgeon: Otis Brace, MD;  [facility];  Service: Gastroenterology;  Laterality: N/A;  . ESOPHAGOGASTRODUODENOSCOPY (EGD) WITH PROPOFOL N/A 09/30/2018   Procedure: ESOPHAGOGASTRODUODENOSCOPY (EGD) WITH PROPOFOL;  Surgeon: Otis Brace, MD;  [facility];  Service: Gastroenterology;  Laterality: N/A;  . I&D EXTREMITY Left 02/18/2018   Procedure: IRRIGATION AND DEBRIDEMENT LEFT ARM;  Surgeon: Serafina Mitchell, MD;  [facility];  Service: Vascular;  Laterality: Left;  . INSERTION OF DIALYSIS CATHETER N/A 01/19/2018   Procedure: INSERTION OF DIALYSIS CATHETER - RIGHT INTERNAL JUGULAR PLACEMENT;  Surgeon: Rosetta Posner, MD;  Location: [place];  Service: Vascular;  Laterality: N/A;  . IR FLUORO GUIDE CV LINE RIGHT  11/10/2017  . IR PARACENTESIS  12/10/2017  . IR PARACENTESIS  12/26/2017  . IR REMOVAL TUN CV CATH W/O FL  11/28/2017  . IR US GUIDE VASC ACCESS RIGHT  11/10/2017  . NECK SURGERY  2007   "back of neck; no hardware in there"    Prior to Admission medications   Medication Sig  Start Date End Date Taking? Authorizing Provider  acetaminophen (TYLENOL) 650 MG CR tablet Take 650 mg by mouth every 8 (eight) hours as needed for pain.   Yes [provider]  amLODipine (NORVASC) 10 MG tablet Take 1 tablet (10 mg total) daily by mouth. 06/20/17 11/06/24 Yes Gildardo Pounds,  NP  carvedilol (COREG) 12.5 MG tablet Take 12.5 mg by mouth 2 (two) times daily with a meal.   Yes [provider]  ferrous gluconate (IRON 27) 240 (27 FE) MG tablet Take 240 mg by mouth daily.    Yes [provider]  insulin NPH-regular Human (NOVOLIN 70/30) (70-30) 100 UNIT/ML injection Inject 18 Units into the skin 2 (two) times daily with a meal. 11/18/17  Yes Regalado, Belkys A, MD  Multiple Vitamins-Minerals (MULTIVITAMIN WITH MINERALS) tablet Take 1 tablet by mouth daily.   Yes [provider]  Polyethyl Glycol-Propyl Glycol (SYSTANE) 0.4-0.3 % SOLN Place 1 drop into both eyes 3 (three) times daily as needed (for dry eyes).   Yes [provider]  calcium acetate (PHOSLO) 667 MG capsule Take 2 capsules (1,334 mg total) by mouth 3 (three) times daily with meals. Patient not taking: Reported on 03/30/2018 01/26/18   Purohit, Konrad Dolores, MD  lactulose (CHRONULAC) 10 GM/15ML solution Take 30 mLs (20 g total) by mouth 2 (two) times daily. Patient not taking: Reported on 03/09/2018 01/26/18   Purohit, Konrad Dolores, MD    Scheduled Meds: Continuous Infusions: . sodium chloride     PRN Meds:.  Allergies as of 03/05/2019 - Review Complete 01/04/2019  Allergen Reaction Noted  . Heparin Other (See Comments) 11/11/2017  . Pork-derived products Other (See Comments) 02/18/2018    Family History  Problem Relation Age of Onset  . Diabetes Mellitus II Sister   . Diabetes Sister   . Stroke Brother   . Heart attack Brother     Social History   Socioeconomic History  . Marital status: Married    Spouse name: Not on file  . Number of children: Not on file  . Years of education: Not on file  . Highest education level: Not on file  Occupational History  . Not on file  Social Needs  . Financial resource strain: Not on file  . Food insecurity    Worry: Not on file    Inability: Not on file  . Transportation needs    Medical: Not on file    Non-medical: Not on file   Tobacco Use  . Smoking status: Never Smoker  . Smokeless tobacco: Never Used  Substance and Sexual Activity  . Alcohol use: Never    Frequency: Never  . Drug use: Never  . Sexual activity: Not on file  Lifestyle  . Physical activity    Days per week: Not on file    Minutes per session: Not on file  . Stress: Not on file  Relationships  . Social Herbalist on phone: Not on file    Gets together: Not on file    Attends religious service: Not on file    Active member of club or organization: Not on file    Attends meetings of clubs or organizations: Not on file    Relationship status: Not on file  . Intimate partner violence    Fear of current or ex partner: Not on file    Emotionally abused: Not on file    Physically abused: Not on file    Forced sexual  activity: Not on file  Other Topics Concern  . Not on file  Social History Narrative  . Not on file    Review of Systems: All negative except as stated above in HPI.  Physical Exam: Vital signs: Vitals:   04/20/19 0654  BP: (!) 176/72  Pulse: 78  Resp: 17  Temp: 98.4 F (36.9 C)     General:   Alert,  Well-developed, well-nourished, pleasant and cooperative in NAD Lungs:  Clear throughout to auscultation.   No wheezes, crackles, or rhonchi. No acute distress. Heart:  Regular rate and rhythm; no murmurs, clicks, rubs,  or gallops. Abdomen: soft, nontender, nondistended, bowel sounds present Rectal:  Deferred  GI:  Lab Results: No results for input(s): WBC, HGB, HCT, PLT in the last 72 hours. BMET No results for input(s): NA, K, CL, CO2, GLUCOSE, BUN, CREATININE, CALCIUM in the last 72 hours. LFT No results for input(s): PROT, ALBUMIN, AST, ALT, ALKPHOS, BILITOT, BILIDIR, IBILI in the last 72 hours. PT/INR No results for input(s): LABPROT, INR in the last 72 hours.   Studies/Results: No results found.  Impression/Plan: -Esophageal and gastric varices.  unspecified cirrhosis of the liver. -  end-stage renal disease on hemodialysis.  Recommendations -------------------------- - Procedure with EGD with possible band ligation.  Risks (bleeding, infection, bowel perforation that could require surgery, sedation-related changes in cardiopulmonary systems), benefits (identification and possible treatment of source of symptoms, exclusion of certain causes of symptoms), and alternatives (watchful waiting, radiographic imaging studies, empiric medical treatment)  were explained to patient and family in detail and patient wishes to proceed.    LOS: 0 days   Otis Brace  MD, FACP 04/20/2019, 8:06 AM  Contact #  [phone]

## 2019-04-20 NOTE — Discharge Instructions (Signed)
YOU HAD AN ENDOSCOPIC PROCEDURE TODAY: Refer to the procedure report and other information in the discharge instructions given to you for any specific questions about what was found during the examination. If this information does not answer your questions, please call [facility] office at [phone] to clarify.   YOU SHOULD EXPECT: Some feelings of bloating in the abdomen. Passage of more gas than usual. Walking can help get rid of the air that was put into your GI tract during the procedure and reduce the bloating. If you had a lower endoscopy (such as a colonoscopy or flexible sigmoidoscopy) you may notice spotting of blood in your stool or on the toilet paper. Some abdominal soreness may be present for a day or two, also.  DIET: Your first meal following the procedure should be a light meal and then it is ok to progress to your normal diet. A half-sandwich or bowl of soup is an example of a good first meal. Heavy or fried foods are harder to digest and may make you feel nauseous or bloated. Drink plenty of fluids but you should avoid alcoholic beverages for 24 hours. If you had a esophageal dilation, please see attached instructions for diet.   ACTIVITY: Your care partner should take you home directly after the procedure. You should plan to take it easy, moving slowly for the rest of the day. You can resume normal activity the day after the procedure however YOU SHOULD NOT DRIVE, use power tools, machinery or perform tasks that involve climbing or major physical exertion for 24 hours (because of the sedation medicines used during the test).   SYMPTOMS TO REPORT IMMEDIATELY: A gastroenterologist can be reached at any hour. Please call [phone]  for any of the following symptoms:    Following upper endoscopy (EGD, EUS, ERCP, esophageal dilation) Vomiting of blood or coffee ground material  New, significant abdominal pain  New, significant chest pain or pain under the shoulder blades  Painful or  persistently difficult swallowing  New shortness of breath  Black, tarry-looking or red, bloody stools  FOLLOW UP:  If any biopsies were taken you will be contacted by phone or by letter within the next 1-3 weeks. Call [phone]  if you have not heard about the biopsies in 3 weeks.  Please also call with any specific questions about appointments or follow up tests.

## 2019-04-20 NOTE — Op Note (Signed)
[facility] Patient Name: William [name] Procedure Date: 04/20/2019 MRN: [id number] Attending MD: Otis Brace , MD Date of Birth: [birth year] CSN: [id number] Age: 68 Admit Type: Outpatient Procedure:                Upper GI endoscopy Indications:              Follow-up of esophageal varices Providers:                Otis Brace, MD, Angus Seller, Cherylynn Ridges, Technician, Cathe Mons, CRNA Referring MD:              Medicines:                Sedation Administered by an Anesthesia Professional Complications:            No immediate complications. Estimated Blood Loss:     Estimated blood loss was minimal. Procedure:                Pre-Anesthesia Assessment:                           - Prior to the procedure, a History and Physical                            was performed, and patient medications and                            allergies were reviewed. The patient's tolerance of                            previous anesthesia was also reviewed. The risks                            and benefits of the procedure and the sedation                            options and risks were discussed with the patient.                            All questions were answered, and informed consent                            was obtained. Prior Anticoagulants: The patient has                            taken no previous anticoagulant or antiplatelet                            agents. ASA Grade Assessment: III - A patient with                            severe systemic disease. After reviewing the risks  and benefits, the patient was deemed in                            satisfactory condition to undergo the procedure.                           After obtaining informed consent, the endoscope was                            passed under direct vision. Throughout the                            procedure, the patient's blood  pressure, pulse, and                            oxygen saturations were monitored continuously. The                            GIF-H190 ([id number]) Olympus gastroscope was                            introduced through the mouth, and advanced to the                            second part of duodenum. The upper GI endoscopy was                            performed with moderate difficulty due to presence                            of food. The patient tolerated the procedure well. Scope In: Scope Out: Findings:      Non-bleeding grade I varices were found in the lower third of the       esophagus,. No stigmata of recent bleeding were evident and no red wale       signs were present.      The exam of the esophagus was otherwise normal.      A medium amount of food (residue) was found in the gastric body.      Type 2 gastroesophageal varices (GOV2, esophageal varices which extend       along the fundus) with no bleeding were found in the cardia. There were       no stigmata of recent bleeding. They were large in largest diameter.      The ampulla, duodenal bulb, first portion of the duodenum and second       portion of the duodenum were normal. Impression:               - Non-bleeding grade I esophageal varices.                           - A medium amount of food (residue) in the stomach.                           - Type 2 gastroesophageal varices (GOV2, esophageal  varices which extend along the fundus), without                            bleeding.                           - Normal ampulla, duodenal bulb, first portion of                            the duodenum and second portion of the duodenum.                           - No specimens collected. Moderate Sedation:      Moderate (conscious) sedation was personally administered by an       anesthesia professional. The following parameters were monitored: oxygen       saturation, heart rate, blood pressure, and  response to care. Recommendation:           - Patient has a contact number available for                            emergencies. The signs and symptoms of potential                            delayed complications were discussed with the                            patient. Return to normal activities tomorrow.                            Written discharge instructions were provided to the                            patient.                           - Resume previous diet.                           - Continue present medications.                           - Repeat upper endoscopy in 6 to 12 months to                            assess disease activity.                           - Return to GI clinic as previously scheduled. Procedure Code(s):        --- Professional ---                           [phone], Esophagogastroduodenoscopy, flexible,                            transoral; diagnostic, including collection of  specimen(s) by brushing or washing, when performed                            (separate procedure) Diagnosis Code(s):        --- Professional ---                           I85.00, Esophageal varices without bleeding                           I86.4, Gastric varices CPT copyright 2019 American Medical Association. All rights reserved. The codes documented in this report are preliminary and upon coder review may  be revised to meet current compliance requirements. Otis Brace, MD Otis Brace, MD 04/20/2019 8:36:56 AM Number of Addenda: 0

## 2019-04-20 NOTE — Transfer of Care (Signed)
Immediate Anesthesia Transfer of Care Note  Patient: William [name]  Procedure(s) Performed: ESOPHAGOGASTRODUODENOSCOPY (EGD) WITH PROPOFOL (N/A )  Patient Location: Endoscopy Unit  Anesthesia Type:MAC  Level of Consciousness: drowsy and patient cooperative  Airway & Oxygen Therapy: Patient Spontanous Breathing and Patient connected to nasal cannula oxygen  Post-op Assessment: Report given to RN and Post -op Vital signs reviewed and stable  Post vital signs: Reviewed and stable  Last Vitals:  Vitals Value Taken Time  BP    Temp    Pulse    Resp    SpO2      Last Pain:  Vitals:   04/20/19 0654  TempSrc: Oral  PainSc: 0-No pain         Complications: No apparent anesthesia complications

## 2019-04-20 NOTE — Anesthesia Postprocedure Evaluation (Signed)
Anesthesia Post Note  Patient: Agastya Derocher  Procedure(s) Performed: ESOPHAGOGASTRODUODENOSCOPY (EGD) WITH PROPOFOL (N/A )     Patient location during evaluation: Endoscopy Anesthesia Type: MAC Level of consciousness: awake and alert Pain management: pain level controlled Vital Signs Assessment: post-procedure vital signs reviewed and stable Respiratory status: spontaneous breathing, nonlabored ventilation, respiratory function stable and patient connected to nasal cannula oxygen Cardiovascular status: blood pressure returned to baseline and stable Postop Assessment: no apparent nausea or vomiting Anesthetic complications: no    Last Vitals:  Vitals:   04/20/19 0840 04/20/19 0850  BP: 136/70 (!) 156/69  Pulse: 70 70  Resp: 16 14  Temp:    SpO2: 96% 97%    Last Pain:  Vitals:   04/20/19 0832  TempSrc: Oral  PainSc: 0-No pain                 Chelsey L Woodrum

## 2019-04-20 NOTE — Anesthesia Preprocedure Evaluation (Addendum)
Anesthesia Evaluation  Patient identified by MRN, date of birth, ID band Patient awake    Reviewed: Allergy & Precautions, NPO status , Patient's Chart, lab work & pertinent test results  Airway Mallampati: II  TM Distance: >3 FB Neck ROM: Full    Dental no notable dental hx. (+) Missing,    Pulmonary neg pulmonary ROS,  Positive for COVID 3 months ago now negative   Pulmonary exam normal breath sounds clear to auscultation       Cardiovascular hypertension, +CHF  Normal cardiovascular exam Rhythm:Regular Rate:Normal  TTE 2019 EF 65-70%, G1DD, no significant valvular abnormalities   Neuro/Psych negative neurological ROS  negative psych ROS   GI/Hepatic GERD  ,(+) Cirrhosis       ,   Endo/Other  negative endocrine ROSdiabetes, Insulin Dependent  Renal/GU ESRF and DialysisRenal disease  negative genitourinary   Musculoskeletal negative musculoskeletal ROS (+)   Abdominal   Peds  Hematology negative hematology ROS (+)   Anesthesia Other Findings EGD for h/o esophageal varices  Reproductive/Obstetrics                            Anesthesia Physical Anesthesia Plan  ASA: III  Anesthesia Plan: MAC   Post-op Pain Management:    Induction: Intravenous  PONV Risk Score and Plan: Propofol infusion and Treatment may vary due to age or medical condition  Airway Management Planned: Natural Airway  Additional Equipment:   Intra-op Plan:   Post-operative Plan:   Informed Consent: I have reviewed the patients History and Physical, chart, labs and discussed the procedure including the risks, benefits and alternatives for the proposed anesthesia with the patient or authorized representative who has indicated his/her understanding and acceptance.     Dental advisory given  Plan Discussed with: CRNA  Anesthesia Plan Comments:         Anesthesia Quick Evaluation

## 2019-04-21 ENCOUNTER — Other Ambulatory Visit: Payer: Self-pay

## 2019-04-21 ENCOUNTER — Encounter (HOSPITAL_COMMUNITY): Payer: Self-pay | Admitting: Gastroenterology

## 2019-04-21 MED ORDER — ONETOUCH VERIO VI STRP: ORAL_STRIP | 3 refills | Status: DC

## 2019-04-22 ENCOUNTER — Other Ambulatory Visit: Payer: Self-pay | Admitting: Gastroenterology

## 2019-04-22 DIAGNOSIS — N186 End stage renal disease: Secondary | ICD-10-CM | POA: Diagnosis not present

## 2019-04-22 DIAGNOSIS — D509 Iron deficiency anemia, unspecified: Secondary | ICD-10-CM | POA: Diagnosis not present

## 2019-04-22 DIAGNOSIS — Z1211 Encounter for screening for malignant neoplasm of colon: Secondary | ICD-10-CM

## 2019-04-22 DIAGNOSIS — D631 Anemia in chronic kidney disease: Secondary | ICD-10-CM | POA: Diagnosis not present

## 2019-04-22 DIAGNOSIS — Z23 Encounter for immunization: Secondary | ICD-10-CM | POA: Diagnosis not present

## 2019-04-22 DIAGNOSIS — Z992 Dependence on renal dialysis: Secondary | ICD-10-CM | POA: Diagnosis not present

## 2019-04-22 DIAGNOSIS — N2581 Secondary hyperparathyroidism of renal origin: Secondary | ICD-10-CM | POA: Diagnosis not present

## 2019-04-24 DIAGNOSIS — N2581 Secondary hyperparathyroidism of renal origin: Secondary | ICD-10-CM | POA: Diagnosis not present

## 2019-04-24 DIAGNOSIS — Z23 Encounter for immunization: Secondary | ICD-10-CM | POA: Diagnosis not present

## 2019-04-24 DIAGNOSIS — Z992 Dependence on renal dialysis: Secondary | ICD-10-CM | POA: Diagnosis not present

## 2019-04-24 DIAGNOSIS — D509 Iron deficiency anemia, unspecified: Secondary | ICD-10-CM | POA: Diagnosis not present

## 2019-04-24 DIAGNOSIS — D631 Anemia in chronic kidney disease: Secondary | ICD-10-CM | POA: Diagnosis not present

## 2019-04-24 DIAGNOSIS — N186 End stage renal disease: Secondary | ICD-10-CM | POA: Diagnosis not present

## 2019-04-27 DIAGNOSIS — D631 Anemia in chronic kidney disease: Secondary | ICD-10-CM | POA: Diagnosis not present

## 2019-04-27 DIAGNOSIS — D509 Iron deficiency anemia, unspecified: Secondary | ICD-10-CM | POA: Diagnosis not present

## 2019-04-27 DIAGNOSIS — N186 End stage renal disease: Secondary | ICD-10-CM | POA: Diagnosis not present

## 2019-04-27 DIAGNOSIS — Z23 Encounter for immunization: Secondary | ICD-10-CM | POA: Diagnosis not present

## 2019-04-27 DIAGNOSIS — N2581 Secondary hyperparathyroidism of renal origin: Secondary | ICD-10-CM | POA: Diagnosis not present

## 2019-04-27 DIAGNOSIS — Z992 Dependence on renal dialysis: Secondary | ICD-10-CM | POA: Diagnosis not present

## 2019-04-29 DIAGNOSIS — D509 Iron deficiency anemia, unspecified: Secondary | ICD-10-CM | POA: Diagnosis not present

## 2019-04-29 DIAGNOSIS — Z23 Encounter for immunization: Secondary | ICD-10-CM | POA: Diagnosis not present

## 2019-04-29 DIAGNOSIS — N2581 Secondary hyperparathyroidism of renal origin: Secondary | ICD-10-CM | POA: Diagnosis not present

## 2019-04-29 DIAGNOSIS — D631 Anemia in chronic kidney disease: Secondary | ICD-10-CM | POA: Diagnosis not present

## 2019-04-29 DIAGNOSIS — Z992 Dependence on renal dialysis: Secondary | ICD-10-CM | POA: Diagnosis not present

## 2019-04-29 DIAGNOSIS — N186 End stage renal disease: Secondary | ICD-10-CM | POA: Diagnosis not present

## 2019-05-01 DIAGNOSIS — D631 Anemia in chronic kidney disease: Secondary | ICD-10-CM | POA: Diagnosis not present

## 2019-05-01 DIAGNOSIS — Z23 Encounter for immunization: Secondary | ICD-10-CM | POA: Diagnosis not present

## 2019-05-01 DIAGNOSIS — N186 End stage renal disease: Secondary | ICD-10-CM | POA: Diagnosis not present

## 2019-05-01 DIAGNOSIS — D509 Iron deficiency anemia, unspecified: Secondary | ICD-10-CM | POA: Diagnosis not present

## 2019-05-01 DIAGNOSIS — Z992 Dependence on renal dialysis: Secondary | ICD-10-CM | POA: Diagnosis not present

## 2019-05-01 DIAGNOSIS — N2581 Secondary hyperparathyroidism of renal origin: Secondary | ICD-10-CM | POA: Diagnosis not present

## 2019-05-03 ENCOUNTER — Other Ambulatory Visit: Payer: Self-pay | Admitting: Emergency Medicine

## 2019-05-03 MED ORDER — CARVEDILOL 3.125 MG PO TABS: 3.1250 mg | ORAL_TABLET | Freq: Two times a day (BID) | ORAL | 1 refills | Status: DC

## 2019-05-04 DIAGNOSIS — D631 Anemia in chronic kidney disease: Secondary | ICD-10-CM | POA: Diagnosis not present

## 2019-05-04 DIAGNOSIS — Z23 Encounter for immunization: Secondary | ICD-10-CM | POA: Diagnosis not present

## 2019-05-04 DIAGNOSIS — Z992 Dependence on renal dialysis: Secondary | ICD-10-CM | POA: Diagnosis not present

## 2019-05-04 DIAGNOSIS — N2581 Secondary hyperparathyroidism of renal origin: Secondary | ICD-10-CM | POA: Diagnosis not present

## 2019-05-04 DIAGNOSIS — N186 End stage renal disease: Secondary | ICD-10-CM | POA: Diagnosis not present

## 2019-05-04 DIAGNOSIS — D509 Iron deficiency anemia, unspecified: Secondary | ICD-10-CM | POA: Diagnosis not present

## 2019-05-06 DIAGNOSIS — M310 Hypersensitivity angiitis: Secondary | ICD-10-CM | POA: Diagnosis not present

## 2019-05-06 DIAGNOSIS — J479 Bronchiectasis, uncomplicated: Secondary | ICD-10-CM | POA: Diagnosis not present

## 2019-05-06 DIAGNOSIS — Z992 Dependence on renal dialysis: Secondary | ICD-10-CM | POA: Diagnosis not present

## 2019-05-06 DIAGNOSIS — E1122 Type 2 diabetes mellitus with diabetic chronic kidney disease: Secondary | ICD-10-CM | POA: Diagnosis not present

## 2019-05-06 DIAGNOSIS — N186 End stage renal disease: Secondary | ICD-10-CM | POA: Diagnosis not present

## 2019-05-06 DIAGNOSIS — D509 Iron deficiency anemia, unspecified: Secondary | ICD-10-CM | POA: Diagnosis not present

## 2019-05-06 DIAGNOSIS — J849 Interstitial pulmonary disease, unspecified: Secondary | ICD-10-CM | POA: Diagnosis not present

## 2019-05-06 DIAGNOSIS — Z01818 Encounter for other preprocedural examination: Secondary | ICD-10-CM | POA: Diagnosis not present

## 2019-05-06 DIAGNOSIS — K7460 Unspecified cirrhosis of liver: Secondary | ICD-10-CM | POA: Diagnosis not present

## 2019-05-06 DIAGNOSIS — N2581 Secondary hyperparathyroidism of renal origin: Secondary | ICD-10-CM | POA: Diagnosis not present

## 2019-05-06 DIAGNOSIS — Z23 Encounter for immunization: Secondary | ICD-10-CM | POA: Diagnosis not present

## 2019-05-06 DIAGNOSIS — R188 Other ascites: Secondary | ICD-10-CM | POA: Diagnosis not present

## 2019-05-06 DIAGNOSIS — D631 Anemia in chronic kidney disease: Secondary | ICD-10-CM | POA: Diagnosis not present

## 2019-05-07 DIAGNOSIS — R188 Other ascites: Secondary | ICD-10-CM | POA: Diagnosis not present

## 2019-05-07 DIAGNOSIS — J849 Interstitial pulmonary disease, unspecified: Secondary | ICD-10-CM | POA: Diagnosis not present

## 2019-05-07 DIAGNOSIS — J479 Bronchiectasis, uncomplicated: Secondary | ICD-10-CM | POA: Diagnosis not present

## 2019-05-07 DIAGNOSIS — Z01818 Encounter for other preprocedural examination: Secondary | ICD-10-CM | POA: Diagnosis not present

## 2019-05-07 DIAGNOSIS — N186 End stage renal disease: Secondary | ICD-10-CM | POA: Diagnosis not present

## 2019-05-07 DIAGNOSIS — Z992 Dependence on renal dialysis: Secondary | ICD-10-CM | POA: Diagnosis not present

## 2019-05-07 DIAGNOSIS — M310 Hypersensitivity angiitis: Secondary | ICD-10-CM | POA: Diagnosis not present

## 2019-05-07 DIAGNOSIS — K7460 Unspecified cirrhosis of liver: Secondary | ICD-10-CM | POA: Diagnosis not present

## 2019-05-08 DIAGNOSIS — N186 End stage renal disease: Secondary | ICD-10-CM | POA: Diagnosis not present

## 2019-05-08 DIAGNOSIS — Z23 Encounter for immunization: Secondary | ICD-10-CM | POA: Diagnosis not present

## 2019-05-08 DIAGNOSIS — D509 Iron deficiency anemia, unspecified: Secondary | ICD-10-CM | POA: Diagnosis not present

## 2019-05-08 DIAGNOSIS — N2581 Secondary hyperparathyroidism of renal origin: Secondary | ICD-10-CM | POA: Diagnosis not present

## 2019-05-08 DIAGNOSIS — Z992 Dependence on renal dialysis: Secondary | ICD-10-CM | POA: Diagnosis not present

## 2019-05-08 DIAGNOSIS — D631 Anemia in chronic kidney disease: Secondary | ICD-10-CM | POA: Diagnosis not present

## 2019-05-11 DIAGNOSIS — Z23 Encounter for immunization: Secondary | ICD-10-CM | POA: Diagnosis not present

## 2019-05-11 DIAGNOSIS — D509 Iron deficiency anemia, unspecified: Secondary | ICD-10-CM | POA: Diagnosis not present

## 2019-05-11 DIAGNOSIS — D631 Anemia in chronic kidney disease: Secondary | ICD-10-CM | POA: Diagnosis not present

## 2019-05-11 DIAGNOSIS — Z992 Dependence on renal dialysis: Secondary | ICD-10-CM | POA: Diagnosis not present

## 2019-05-11 DIAGNOSIS — N2581 Secondary hyperparathyroidism of renal origin: Secondary | ICD-10-CM | POA: Diagnosis not present

## 2019-05-11 DIAGNOSIS — N186 End stage renal disease: Secondary | ICD-10-CM | POA: Diagnosis not present

## 2019-05-13 DIAGNOSIS — N186 End stage renal disease: Secondary | ICD-10-CM | POA: Diagnosis not present

## 2019-05-13 DIAGNOSIS — Z23 Encounter for immunization: Secondary | ICD-10-CM | POA: Diagnosis not present

## 2019-05-13 DIAGNOSIS — D509 Iron deficiency anemia, unspecified: Secondary | ICD-10-CM | POA: Diagnosis not present

## 2019-05-13 DIAGNOSIS — N2581 Secondary hyperparathyroidism of renal origin: Secondary | ICD-10-CM | POA: Diagnosis not present

## 2019-05-13 DIAGNOSIS — D631 Anemia in chronic kidney disease: Secondary | ICD-10-CM | POA: Diagnosis not present

## 2019-05-13 DIAGNOSIS — Z992 Dependence on renal dialysis: Secondary | ICD-10-CM | POA: Diagnosis not present

## 2019-05-15 DIAGNOSIS — N186 End stage renal disease: Secondary | ICD-10-CM | POA: Diagnosis not present

## 2019-05-15 DIAGNOSIS — D509 Iron deficiency anemia, unspecified: Secondary | ICD-10-CM | POA: Diagnosis not present

## 2019-05-15 DIAGNOSIS — Z23 Encounter for immunization: Secondary | ICD-10-CM | POA: Diagnosis not present

## 2019-05-15 DIAGNOSIS — Z992 Dependence on renal dialysis: Secondary | ICD-10-CM | POA: Diagnosis not present

## 2019-05-15 DIAGNOSIS — N2581 Secondary hyperparathyroidism of renal origin: Secondary | ICD-10-CM | POA: Diagnosis not present

## 2019-05-15 DIAGNOSIS — D631 Anemia in chronic kidney disease: Secondary | ICD-10-CM | POA: Diagnosis not present

## 2019-05-18 DIAGNOSIS — D509 Iron deficiency anemia, unspecified: Secondary | ICD-10-CM | POA: Diagnosis not present

## 2019-05-18 DIAGNOSIS — N2581 Secondary hyperparathyroidism of renal origin: Secondary | ICD-10-CM | POA: Diagnosis not present

## 2019-05-18 DIAGNOSIS — D631 Anemia in chronic kidney disease: Secondary | ICD-10-CM | POA: Diagnosis not present

## 2019-05-18 DIAGNOSIS — N186 End stage renal disease: Secondary | ICD-10-CM | POA: Diagnosis not present

## 2019-05-18 DIAGNOSIS — Z992 Dependence on renal dialysis: Secondary | ICD-10-CM | POA: Diagnosis not present

## 2019-05-18 DIAGNOSIS — Z23 Encounter for immunization: Secondary | ICD-10-CM | POA: Diagnosis not present

## 2019-05-20 DIAGNOSIS — D509 Iron deficiency anemia, unspecified: Secondary | ICD-10-CM | POA: Diagnosis not present

## 2019-05-20 DIAGNOSIS — Z23 Encounter for immunization: Secondary | ICD-10-CM | POA: Diagnosis not present

## 2019-05-20 DIAGNOSIS — N186 End stage renal disease: Secondary | ICD-10-CM | POA: Diagnosis not present

## 2019-05-20 DIAGNOSIS — Z992 Dependence on renal dialysis: Secondary | ICD-10-CM | POA: Diagnosis not present

## 2019-05-20 DIAGNOSIS — D631 Anemia in chronic kidney disease: Secondary | ICD-10-CM | POA: Diagnosis not present

## 2019-05-20 DIAGNOSIS — N2581 Secondary hyperparathyroidism of renal origin: Secondary | ICD-10-CM | POA: Diagnosis not present

## 2019-05-22 DIAGNOSIS — N2581 Secondary hyperparathyroidism of renal origin: Secondary | ICD-10-CM | POA: Diagnosis not present

## 2019-05-22 DIAGNOSIS — Z992 Dependence on renal dialysis: Secondary | ICD-10-CM | POA: Diagnosis not present

## 2019-05-22 DIAGNOSIS — D509 Iron deficiency anemia, unspecified: Secondary | ICD-10-CM | POA: Diagnosis not present

## 2019-05-22 DIAGNOSIS — D631 Anemia in chronic kidney disease: Secondary | ICD-10-CM | POA: Diagnosis not present

## 2019-05-22 DIAGNOSIS — N186 End stage renal disease: Secondary | ICD-10-CM | POA: Diagnosis not present

## 2019-05-22 DIAGNOSIS — Z23 Encounter for immunization: Secondary | ICD-10-CM | POA: Diagnosis not present

## 2019-05-24 ENCOUNTER — Ambulatory Visit
Admission: RE | Admit: 2019-05-24 | Discharge: 2019-05-24 | Disposition: A | Discharge status: Home / Self Care | Payer: Medicare Other | Source: Ambulatory Visit | Attending: Gastroenterology | Admitting: Gastroenterology

## 2019-05-24 DIAGNOSIS — Z1211 Encounter for screening for malignant neoplasm of colon: Secondary | ICD-10-CM

## 2019-05-24 DIAGNOSIS — K7460 Unspecified cirrhosis of liver: Secondary | ICD-10-CM | POA: Diagnosis not present

## 2019-05-25 DIAGNOSIS — D509 Iron deficiency anemia, unspecified: Secondary | ICD-10-CM | POA: Diagnosis not present

## 2019-05-25 DIAGNOSIS — N2581 Secondary hyperparathyroidism of renal origin: Secondary | ICD-10-CM | POA: Diagnosis not present

## 2019-05-25 DIAGNOSIS — Z992 Dependence on renal dialysis: Secondary | ICD-10-CM | POA: Diagnosis not present

## 2019-05-25 DIAGNOSIS — Z23 Encounter for immunization: Secondary | ICD-10-CM | POA: Diagnosis not present

## 2019-05-25 DIAGNOSIS — D631 Anemia in chronic kidney disease: Secondary | ICD-10-CM | POA: Diagnosis not present

## 2019-05-25 DIAGNOSIS — N186 End stage renal disease: Secondary | ICD-10-CM | POA: Diagnosis not present

## 2019-05-27 DIAGNOSIS — D631 Anemia in chronic kidney disease: Secondary | ICD-10-CM | POA: Diagnosis not present

## 2019-05-27 DIAGNOSIS — N2581 Secondary hyperparathyroidism of renal origin: Secondary | ICD-10-CM | POA: Diagnosis not present

## 2019-05-27 DIAGNOSIS — Z23 Encounter for immunization: Secondary | ICD-10-CM | POA: Diagnosis not present

## 2019-05-27 DIAGNOSIS — D509 Iron deficiency anemia, unspecified: Secondary | ICD-10-CM | POA: Diagnosis not present

## 2019-05-27 DIAGNOSIS — N186 End stage renal disease: Secondary | ICD-10-CM | POA: Diagnosis not present

## 2019-05-27 DIAGNOSIS — E1129 Type 2 diabetes mellitus with other diabetic kidney complication: Secondary | ICD-10-CM | POA: Diagnosis not present

## 2019-05-27 DIAGNOSIS — Z992 Dependence on renal dialysis: Secondary | ICD-10-CM | POA: Diagnosis not present

## 2019-05-29 DIAGNOSIS — D509 Iron deficiency anemia, unspecified: Secondary | ICD-10-CM | POA: Diagnosis not present

## 2019-05-29 DIAGNOSIS — N2581 Secondary hyperparathyroidism of renal origin: Secondary | ICD-10-CM | POA: Diagnosis not present

## 2019-05-29 DIAGNOSIS — Z992 Dependence on renal dialysis: Secondary | ICD-10-CM | POA: Diagnosis not present

## 2019-05-29 DIAGNOSIS — N186 End stage renal disease: Secondary | ICD-10-CM | POA: Diagnosis not present

## 2019-05-29 DIAGNOSIS — D631 Anemia in chronic kidney disease: Secondary | ICD-10-CM | POA: Diagnosis not present

## 2019-05-29 DIAGNOSIS — Z23 Encounter for immunization: Secondary | ICD-10-CM | POA: Diagnosis not present

## 2019-06-01 ENCOUNTER — Observation Stay (HOSPITAL_BASED_OUTPATIENT_CLINIC_OR_DEPARTMENT_OTHER)
Admission: EM | Admit: 2019-06-01 | Discharge: 2019-06-02 | Disposition: A | Discharge status: Home / Self Care | Payer: Medicare Other | Attending: Family Medicine | Admitting: Family Medicine

## 2019-06-01 ENCOUNTER — Encounter (HOSPITAL_BASED_OUTPATIENT_CLINIC_OR_DEPARTMENT_OTHER): Payer: Self-pay

## 2019-06-01 ENCOUNTER — Emergency Department (HOSPITAL_BASED_OUTPATIENT_CLINIC_OR_DEPARTMENT_OTHER): Payer: Medicare Other

## 2019-06-01 ENCOUNTER — Other Ambulatory Visit: Payer: Self-pay

## 2019-06-01 DIAGNOSIS — Z79899 Other long term (current) drug therapy: Secondary | ICD-10-CM | POA: Diagnosis not present

## 2019-06-01 DIAGNOSIS — N186 End stage renal disease: Secondary | ICD-10-CM | POA: Insufficient documentation

## 2019-06-01 DIAGNOSIS — I132 Hypertensive heart and chronic kidney disease with heart failure and with stage 5 chronic kidney disease, or end stage renal disease: Secondary | ICD-10-CM | POA: Diagnosis not present

## 2019-06-01 DIAGNOSIS — Z8249 Family history of ischemic heart disease and other diseases of the circulatory system: Secondary | ICD-10-CM | POA: Insufficient documentation

## 2019-06-01 DIAGNOSIS — I8510 Secondary esophageal varices without bleeding: Secondary | ICD-10-CM | POA: Insufficient documentation

## 2019-06-01 DIAGNOSIS — Z888 Allergy status to other drugs, medicaments and biological substances status: Secondary | ICD-10-CM | POA: Insufficient documentation

## 2019-06-01 DIAGNOSIS — T82898A Other specified complication of vascular prosthetic devices, implants and grafts, initial encounter: Secondary | ICD-10-CM

## 2019-06-01 DIAGNOSIS — Z95828 Presence of other vascular implants and grafts: Secondary | ICD-10-CM | POA: Insufficient documentation

## 2019-06-01 DIAGNOSIS — Z20828 Contact with and (suspected) exposure to other viral communicable diseases: Secondary | ICD-10-CM | POA: Insufficient documentation

## 2019-06-01 DIAGNOSIS — I10 Essential (primary) hypertension: Secondary | ICD-10-CM | POA: Diagnosis not present

## 2019-06-01 DIAGNOSIS — E875 Hyperkalemia: Secondary | ICD-10-CM

## 2019-06-01 DIAGNOSIS — H547 Unspecified visual loss: Secondary | ICD-10-CM | POA: Insufficient documentation

## 2019-06-01 DIAGNOSIS — K7581 Nonalcoholic steatohepatitis (NASH): Secondary | ICD-10-CM | POA: Diagnosis not present

## 2019-06-01 DIAGNOSIS — E1122 Type 2 diabetes mellitus with diabetic chronic kidney disease: Secondary | ICD-10-CM | POA: Diagnosis not present

## 2019-06-01 DIAGNOSIS — N2581 Secondary hyperparathyroidism of renal origin: Secondary | ICD-10-CM | POA: Insufficient documentation

## 2019-06-01 DIAGNOSIS — E1169 Type 2 diabetes mellitus with other specified complication: Secondary | ICD-10-CM

## 2019-06-01 DIAGNOSIS — K7460 Unspecified cirrhosis of liver: Secondary | ICD-10-CM | POA: Insufficient documentation

## 2019-06-01 DIAGNOSIS — K7469 Other cirrhosis of liver: Secondary | ICD-10-CM | POA: Diagnosis not present

## 2019-06-01 DIAGNOSIS — I517 Cardiomegaly: Secondary | ICD-10-CM | POA: Diagnosis not present

## 2019-06-01 DIAGNOSIS — D61818 Other pancytopenia: Secondary | ICD-10-CM | POA: Diagnosis not present

## 2019-06-01 DIAGNOSIS — Z7682 Awaiting organ transplant status: Secondary | ICD-10-CM | POA: Insufficient documentation

## 2019-06-01 DIAGNOSIS — I5022 Chronic systolic (congestive) heart failure: Secondary | ICD-10-CM

## 2019-06-01 DIAGNOSIS — Z992 Dependence on renal dialysis: Secondary | ICD-10-CM | POA: Insufficient documentation

## 2019-06-01 DIAGNOSIS — E785 Hyperlipidemia, unspecified: Secondary | ICD-10-CM | POA: Insufficient documentation

## 2019-06-01 DIAGNOSIS — Z833 Family history of diabetes mellitus: Secondary | ICD-10-CM | POA: Insufficient documentation

## 2019-06-01 DIAGNOSIS — Y832 Surgical operation with anastomosis, bypass or graft as the cause of abnormal reaction of the patient, or of later complication, without mention of misadventure at the time of the procedure: Secondary | ICD-10-CM | POA: Insufficient documentation

## 2019-06-01 DIAGNOSIS — T82868A Thrombosis of vascular prosthetic devices, implants and grafts, initial encounter: Secondary | ICD-10-CM | POA: Diagnosis not present

## 2019-06-01 DIAGNOSIS — Z03818 Encounter for observation for suspected exposure to other biological agents ruled out: Secondary | ICD-10-CM | POA: Diagnosis not present

## 2019-06-01 DIAGNOSIS — Z794 Long term (current) use of insulin: Secondary | ICD-10-CM | POA: Diagnosis not present

## 2019-06-01 DIAGNOSIS — T8249XA Other complication of vascular dialysis catheter, initial encounter: Secondary | ICD-10-CM

## 2019-06-01 LAB — CBC WITH DIFFERENTIAL/PLATELET
Abs Immature Granulocytes: 0.01 10*3/uL (ref 0.00–0.07)
Basophils Absolute: 0 10*3/uL (ref 0.0–0.1)
Basophils Relative: 0 %
Eosinophils Absolute: 0.1 10*3/uL (ref 0.0–0.5)
Eosinophils Relative: 4 %
HCT: 34.3 % — ABNORMAL LOW (ref 39.0–52.0)
Hemoglobin: 10.7 g/dL — ABNORMAL LOW (ref 13.0–17.0)
Immature Granulocytes: 0 %
Lymphocytes Relative: 24 %
Lymphs Abs: 0.8 10*3/uL (ref 0.7–4.0)
MCH: 26 pg (ref 26.0–34.0)
MCHC: 31.2 g/dL (ref 30.0–36.0)
MCV: 83.3 fL (ref 80.0–100.0)
Monocytes Absolute: 0.4 10*3/uL (ref 0.1–1.0)
Monocytes Relative: 14 %
Neutro Abs: 1.9 10*3/uL (ref 1.7–7.7)
Neutrophils Relative %: 58 %
Platelets: 44 10*3/uL — ABNORMAL LOW (ref 150–400)
RBC: 4.12 MIL/uL — ABNORMAL LOW (ref 4.22–5.81)
RDW: 16.1 % — ABNORMAL HIGH (ref 11.5–15.5)
Smear Review: DECREASED
WBC: 3.2 10*3/uL — ABNORMAL LOW (ref 4.0–10.5)
nRBC: 0 % (ref 0.0–0.2)

## 2019-06-01 LAB — COMPREHENSIVE METABOLIC PANEL
ALT: 19 U/L (ref 0–44)
AST: 24 U/L (ref 15–41)
Albumin: 3.6 g/dL (ref 3.5–5.0)
Alkaline Phosphatase: 123 U/L (ref 38–126)
Anion gap: 13 (ref 5–15)
BUN: 115 mg/dL — ABNORMAL HIGH (ref 8–23)
CO2: 18 mmol/L — ABNORMAL LOW (ref 22–32)
Calcium: 8.4 mg/dL — ABNORMAL LOW (ref 8.9–10.3)
Chloride: 100 mmol/L (ref 98–111)
Creatinine, Ser: 9.01 mg/dL — ABNORMAL HIGH (ref 0.61–1.24)
GFR calc Af Amer: 6 mL/min — ABNORMAL LOW (ref >60)
GFR calc non Af Amer: 5 mL/min — ABNORMAL LOW (ref >60)
Glucose, Bld: 339 mg/dL — ABNORMAL HIGH (ref 70–99)
Potassium: 6.3 mmol/L (ref 3.5–5.1)
Sodium: 131 mmol/L — ABNORMAL LOW (ref 135–145)
Total Bilirubin: 0.8 mg/dL (ref 0.3–1.2)
Total Protein: 6.9 g/dL (ref 6.5–8.1)

## 2019-06-01 LAB — CBG MONITORING, ED: Glucose-Capillary: 254 mg/dL — ABNORMAL HIGH (ref 70–99)

## 2019-06-01 LAB — GLUCOSE, CAPILLARY
Glucose-Capillary: 178 mg/dL — ABNORMAL HIGH (ref 70–99)
Glucose-Capillary: 241 mg/dL — ABNORMAL HIGH (ref 70–99)

## 2019-06-01 LAB — MRSA PCR SCREENING: MRSA by PCR: NEGATIVE

## 2019-06-01 LAB — SARS CORONAVIRUS 2 BY RT PCR (HOSPITAL ORDER, PERFORMED IN ~~LOC~~ HOSPITAL LAB): SARS Coronavirus 2: NEGATIVE

## 2019-06-01 MED ORDER — CALCIUM GLUCONATE-NACL 1-0.675 GM/50ML-% IV SOLN
1.0000 g | Freq: Once | INTRAVENOUS | Status: AC
  Administered 2019-06-01: 1000 mg via INTRAVENOUS
  Filled 2019-06-01: qty 50

## 2019-06-01 MED ORDER — CALCIUM GLUCONATE-NACL 1-0.675 GM/50ML-% IV SOLN
INTRAVENOUS | Status: AC
  Filled 2019-06-01: qty 50

## 2019-06-01 MED ORDER — SODIUM ZIRCONIUM CYCLOSILICATE 10 G PO PACK
10.0000 g | PACK | Freq: Once | ORAL | Status: AC
  Administered 2019-06-01: 10 g via ORAL
  Filled 2019-06-01: qty 1

## 2019-06-01 MED ORDER — SODIUM POLYSTYRENE SULFONATE 15 GM/60ML PO SUSP: 30.0000 g | Freq: Once | ORAL | Status: DC

## 2019-06-01 MED ORDER — INSULIN REGULAR HUMAN 100 UNIT/ML IJ SOLN
5.0000 [IU] | Freq: Once | INTRAMUSCULAR | Status: AC
  Administered 2019-06-01: 5 [IU] via SUBCUTANEOUS
  Filled 2019-06-01: qty 1

## 2019-06-01 MED ORDER — INSULIN ASPART 100 UNIT/ML ~~LOC~~ SOLN: 3.0000 [IU] | Freq: Three times a day (TID) | SUBCUTANEOUS | Status: DC

## 2019-06-01 MED ORDER — INSULIN ASPART 100 UNIT/ML ~~LOC~~ SOLN: 0.0000 [IU] | Freq: Three times a day (TID) | SUBCUTANEOUS | Status: DC

## 2019-06-01 MED ORDER — INSULIN ASPART 100 UNIT/ML ~~LOC~~ SOLN
0.0000 [IU] | Freq: Every day | SUBCUTANEOUS | Status: DC
  Administered 2019-06-01: 2 [IU] via SUBCUTANEOUS

## 2019-06-01 MED ORDER — INSULIN GLARGINE 100 UNIT/ML ~~LOC~~ SOLN
10.0000 [IU] | Freq: Every day | SUBCUTANEOUS | Status: DC
  Administered 2019-06-01 – 2019-06-02 (×2): 10 [IU] via SUBCUTANEOUS
  Filled 2019-06-01 (×2): qty 0.1

## 2019-06-01 MED ORDER — SODIUM POLYSTYRENE SULFONATE 15 GM/60ML PO SUSP
45.0000 g | Freq: Once | ORAL | Status: AC
  Administered 2019-06-01: 45 g via ORAL
  Filled 2019-06-01: qty 180

## 2019-06-01 MED ORDER — DEXTROSE 50 % IV SOLN
1.0000 | Freq: Once | INTRAVENOUS | Status: AC
  Administered 2019-06-01: 50 mL via INTRAVENOUS
  Filled 2019-06-01: qty 50

## 2019-06-01 MED ORDER — SODIUM CHLORIDE 0.9 % IV SOLN
1.0000 g | Freq: Once | INTRAVENOUS | Status: DC
  Filled 2019-06-01: qty 10

## 2019-06-01 NOTE — ED Notes (Signed)
Pt on monitor 

## 2019-06-01 NOTE — ED Provider Notes (Signed)
[facility] EMERGENCY DEPARTMENT Provider Note   CSN: [id number] Arrival date & time: 06/01/19  1457     History   Chief Complaint Chief Complaint  Patient presents with   Vascular Access Problem    HPI Ercel Pepitone is a 68 y.o. male.     HPI   Presents from [facility] with concern for thrombosed fistula. Spoke to [place], Camera operator that reports [facility] is currently closed due to positive COVID in staff members and that they were unable to schedule with IR and sent him to the ED for help.  Son interpretting states the same. No other acute concerns.  Specifically no dyspnea, cough, fever, n/v, or other concerns.   Past Medical History:  Diagnosis Date   Anemia    Blind one eye    CHF (congestive heart failure) ([place])    Cirrhosis (HCC)    Cirrhosis ([place])    CKD (chronic kidney disease), stage IV ([place])    Dialysis patient ([place])    Goodpasture's disease ([place])    on outpatient plasmapheresis/notes 11/20/2017   Grade I diastolic dysfunction 97/98/9211   History of anemia due to chronic kidney disease    History of blood transfusion X 1   "UGIB; low blood count"   History of plasmapheresis    "qod" (11/20/2017)   Hypertension    Pancytopenia ([place]) 08/20/2017   Type II diabetes mellitus ([place])     Patient Active Problem List   Diagnosis Date Noted   Hyperkalemia, diminished renal excretion 06/01/2019   AV fistula thrombosis, initial encounter ([place]) 06/01/2019   AV fistula occlusion, initial encounter ([place])    Chronic systolic CHF (congestive heart failure) ([place])    Encounter for other preprocedural examination 09/23/2018   Esophageal varices ([place]) 09/23/2018   Thrombocytopenia ([place]) 09/23/2018   CHF (congestive heart failure) ([place]) 09/22/2018   ESRD on dialysis ([place]) 02/06/2018   CKD (chronic kidney disease) stage 4, GFR 15-29 ml/min ([place]) 12/19/2017   Cirrhosis of liver with ascites ([place])  12/09/2017   Goodpasture's syndrome ([place]) with associated hemoptysis 11/20/2017   Acute on chronic diastolic CHF (congestive heart failure) ([place]) 11/06/2017   Pancytopenia ([place]) 11/06/2017   GERD (gastroesophageal reflux disease) 11/06/2017   Essential hypertension 11/06/2017   Type II diabetes mellitus with renal manifestations ([place]) 08/25/2015    Past Surgical History:  Procedure Laterality Date   AV FISTULA PLACEMENT Left 12/26/2017   Procedure: LEFT BRACHIOCEPHALIC ARTERIOVENOUS (AV) FISTULA CREATION;  Surgeon: Conrad Cascade, MD;  Location: [facility];  Service: Vascular;  Laterality: Left;   BASCILIC VEIN TRANSPOSITION Left 02/16/2018   Procedure: BRACHIOBASILIC VEIN TRANSPOSITION SECOND STAGE;  Surgeon: Conrad [place], MD;  Location: [facility] OR;  Service: Vascular;  Laterality: Left;   CATARACT EXTRACTION W/ INTRAOCULAR LENS  IMPLANT, BILATERAL Bilateral    ESOPHAGEAL BANDING N/A 04/01/2018   Procedure: ESOPHAGEAL BANDING;  Surgeon: Otis Brace, MD;  Location: [facility];  Service: Gastroenterology;  Laterality: N/A;   ESOPHAGEAL BANDING N/A 06/29/2018   Procedure: ESOPHAGEAL BANDING;  Surgeon: Otis Brace, MD;  Location: [facility];  Service: Gastroenterology;  Laterality: N/A;   ESOPHAGOGASTRODUODENOSCOPY (EGD) WITH PROPOFOL N/A 04/01/2018   Procedure: ESOPHAGOGASTRODUODENOSCOPY (EGD) WITH PROPOFOL;  Surgeon: Otis Brace, MD;  [facility];  Service: Gastroenterology;  Laterality: N/A;   ESOPHAGOGASTRODUODENOSCOPY (EGD) WITH PROPOFOL N/A 06/29/2018   Procedure: ESOPHAGOGASTRODUODENOSCOPY (EGD) WITH PROPOFOL;  Surgeon: Otis Brace, MD;  [facility];  Service: Gastroenterology;  Laterality: N/A;   ESOPHAGOGASTRODUODENOSCOPY (  EGD) WITH PROPOFOL N/A 09/30/2018   Procedure: ESOPHAGOGASTRODUODENOSCOPY (EGD) WITH PROPOFOL;  Surgeon: Otis Brace, MD;  [facility];  Service: Gastroenterology;  Laterality: N/A;    ESOPHAGOGASTRODUODENOSCOPY (EGD) WITH PROPOFOL N/A 04/20/2019   Procedure: ESOPHAGOGASTRODUODENOSCOPY (EGD) WITH PROPOFOL;  Surgeon: Otis Brace, MD;  [facility];  Service: Gastroenterology;  Laterality: N/A;   I&D EXTREMITY Left 02/18/2018   Procedure: IRRIGATION AND DEBRIDEMENT LEFT ARM;  Surgeon: Serafina Mitchell, MD;  Location: [facility];  Service: Vascular;  Laterality: Left;   INSERTION OF DIALYSIS CATHETER N/A 01/19/2018   Procedure: INSERTION OF DIALYSIS CATHETER - RIGHT INTERNAL JUGULAR PLACEMENT;  Surgeon: Rosetta Posner, MD;  Location: [facility];  Service: Vascular;  Laterality: N/A;   IR FLUORO GUIDE CV LINE RIGHT  11/10/2017   IR PARACENTESIS  12/10/2017   IR PARACENTESIS  12/26/2017   IR REMOVAL TUN CV CATH W/O FL  11/28/2017   IR US GUIDE VASC ACCESS RIGHT  11/10/2017   NECK SURGERY  2007   "back of neck; no hardware in there"        Home Medications    Prior to Admission medications   Medication Sig Start Date End Date Taking? Authorizing Provider  acetaminophen (TYLENOL) 650 MG CR tablet Take 650 mg by mouth every 8 (eight) hours as needed for pain.    [provider]  amLODipine (NORVASC) 10 MG tablet Take 1 tablet (10 mg total) by mouth daily. Patient taking differently: Take 5 mg by mouth daily.  04/21/18 09/25/19  Horald Pollen, MD  calcium acetate (PHOSLO) 667 MG capsule Take 2 capsules (1,334 mg total) by mouth 3 (three) times daily with meals. Patient taking differently: Take 667 mg by mouth 3 (three) times daily with meals.  01/26/18   Purohit, Konrad Dolores, MD  carvedilol (COREG) 3.125 MG tablet Take 1 tablet (3.125 mg total) by mouth 2 (two) times daily. 05/03/19 08/01/19  Horald Pollen, MD  ferrous sulfate 325 (65 FE) MG tablet Take 325 mg by mouth daily with breakfast.    [provider]  glucose blood (ONETOUCH VERIO) test strip USE TO CHECK BLOOD SUGAR THREE TIMES DAILY. DX:E11.65 04/21/19   Elayne Snare, MD  insulin degludec  (TRESIBA FLEXTOUCH) 100 UNIT/ML SOPN FlexTouch Pen Inject 0.14 mLs (14 Units total) into the skin daily. Inject 14 units under the skin once daily. Patient taking differently: Inject 12 Units into the skin daily.  03/02/19   Elayne Snare, MD  Insulin Pen Needle (ADVOCATE INSULIN PEN NEEDLES) 31G X 5 MM MISC Use four times daily to inject insulin 04/27/18   Elayne Snare, MD  insulin regular (NOVOLIN R,HUMULIN R) 100 units/mL injection Inject 6-10 Units into the skin See admin instructions. INJECT 8 UNITS UNDER THE SKIN AT BREAKFAST, 10 UNITS AT LUNCH AND 8 UNITS AT DINNER.    [provider]  lidocaine-prilocaine (EMLA) cream Apply 1 application topically Every Tuesday,Thursday,and Saturday with dialysis. 08/24/18   [provider]  Multiple Vitamins-Minerals (MULTIVITAMIN WITH MINERALS) tablet Take 1 tablet by mouth daily.    [provider]  mupirocin ointment (BACTROBAN) 2 % Apply 1 application topically 2 (two) times daily. Patient not taking: Reported on 04/15/2019 12/14/18   Trula Slade, DPM  ONE TOUCH LANCETS MISC Use to test blood sugar three times daily 04/27/18   Elayne Snare, MD  Polyethyl Glycol-Propyl Glycol (SYSTANE) 0.4-0.3 % SOLN Place 1 drop into both eyes 3 (three) times daily as needed (for dry eyes).  [provider]  rosuvastatin (CRESTOR) 5 MG tablet Take 1 tablet (5 mg total) by mouth daily. 03/17/19   Elayne Snare, MD    Family History Family History  Problem Relation Age of Onset   Diabetes Mellitus II Sister    Diabetes Sister    Stroke Brother    Heart attack Brother     Social History Social History   Tobacco Use   Smoking status: Never Smoker   Smokeless tobacco: Never Used  Substance Use Topics   Alcohol use: Never    Frequency: Never   Drug use: Never     Allergies   Heparin and Pork-derived products   Review of Systems Review of Systems  Constitutional: Negative for fever.  Respiratory: Negative for  cough and shortness of breath.   Cardiovascular: Negative for chest pain.  Gastrointestinal: Negative for abdominal pain, nausea and vomiting.  Skin: Negative for rash.  Neurological: Negative for syncope and headaches.     Physical Exam Updated Vital Signs BP 140/69 (BP Location: Right Arm)    Pulse 82    Temp 98 F (36.7 C) (Oral)    Resp 12    Wt 69.4 kg    SpO2 99%    BMI 25.46 kg/m   Physical Exam Vitals signs and nursing note reviewed.  Constitutional:      General: He is not in acute distress.    Appearance: He is well-developed. He is not diaphoretic.  HENT:     Head: Normocephalic and atraumatic.  Eyes:     Conjunctiva/sclera: Conjunctivae normal.  Neck:     Musculoskeletal: Normal range of motion.  Cardiovascular:     Rate and Rhythm: Normal rate and regular rhythm.  Pulmonary:     Effort: Pulmonary effort is normal. No respiratory distress.  Abdominal:     General: There is no distension.     Palpations: Abdomen is soft.     Tenderness: There is no abdominal tenderness. There is no guarding.  Musculoskeletal:     Comments: LUE fistula without thrill  Skin:    General: Skin is warm and dry.  Neurological:     Mental Status: He is alert and oriented to person, place, and time.      ED Treatments / Results  Labs (all labs ordered are listed, but only abnormal results are displayed) Labs Reviewed  CBC WITH DIFFERENTIAL/PLATELET - Abnormal; Notable for the following components:      Result Value   WBC 3.2 (*)    RBC 4.12 (*)    Hemoglobin 10.7 (*)    HCT 34.3 (*)    RDW 16.1 (*)    Platelets 44 (*)    All other components within normal limits  COMPREHENSIVE METABOLIC PANEL - Abnormal; Notable for the following components:   Sodium 131 (*)    Potassium 6.3 (*)    CO2 18 (*)    Glucose, Bld 339 (*)    BUN 115 (*)    Creatinine, Ser 9.01 (*)    Calcium 8.4 (*)    GFR calc non Af Amer 5 (*)    GFR calc Af Amer 6 (*)    All other components within  normal limits  BASIC METABOLIC PANEL - Abnormal; Notable for the following components:   CO2 18 (*)    Glucose, Bld 175 (*)    BUN 112 (*)    Creatinine, Ser 9.12 (*)    Calcium 8.6 (*)    GFR calc non Af Wyvonnia Lora  5 (*)    GFR calc Af Amer 6 (*)    All other components within normal limits  GLUCOSE, CAPILLARY - Abnormal; Notable for the following components:   Glucose-Capillary 178 (*)    All other components within normal limits  GLUCOSE, CAPILLARY - Abnormal; Notable for the following components:   Glucose-Capillary 241 (*)    All other components within normal limits  CBG MONITORING, ED - Abnormal; Notable for the following components:   Glucose-Capillary 254 (*)    All other components within normal limits  SARS CORONAVIRUS 2 BY RT PCR (HOSPITAL ORDER, [facility])  MRSA PCR SCREENING  HIV ANTIBODY (ROUTINE TESTING W REFLEX)  BASIC METABOLIC PANEL  CBC    EKG EKG Interpretation  Date/Time:  Tuesday June 01 2019 15:33:28 EDT Ventricular Rate:  82 PR Interval:    QRS Duration: 78 QT Interval:  365 QTC Calculation: 427 R Axis:   17 Text Interpretation: Sinus rhythm Baseline wander in lead(s) V5 No significant change since last tracing Confirmed by Gareth Morgan [phone]) on 06/01/2019 3:48:22 PM   Radiology Dg Chest Portable 1 View  Result Date: 06/01/2019 CLINICAL DATA:  Volume overload.  End-stage renal disease EXAM: PORTABLE CHEST 1 VIEW COMPARISON:  January 23, 2018 FINDINGS: No edema or consolidation. Heart is mildly enlarged with pulmonary vascularity within normal limits. No adenopathy. There is aortic atherosclerosis. No appreciable bone lesions. IMPRESSION: Mild cardiomegaly. No edema or consolidation. Aortic Atherosclerosis (ICD10-I70.0). Electronically Signed   By: Lowella Grip III M.D.   On: 06/01/2019 17:23    Procedures .Critical Care Performed by: Gareth Morgan, MD Authorized by: Gareth Morgan, MD   Critical care  provider statement:    Critical care time (minutes):  70   Critical care was time spent personally by me on the following activities:  Discussions with consultants, evaluation of patient's response to treatment, examination of patient, ordering and performing treatments and interventions, ordering and review of laboratory studies, ordering and review of radiographic studies, pulse oximetry, re-evaluation of patient's condition, obtaining history from patient or surrogate and review of old charts   (including critical care time)  Medications Ordered in ED Medications  insulin aspart (novoLOG) injection 0-9 Units (has no administration in time range)  insulin glargine (LANTUS) injection 10 Units (has no administration in time range)  insulin aspart (novoLOG) injection 0-5 Units (has no administration in time range)  insulin aspart (novoLOG) injection 3 Units (has no administration in time range)  sodium zirconium cyclosilicate (LOKELMA) packet 10 g (10 g Oral Given 06/01/19 1704)  insulin regular (NOVOLIN R) 100 units/mL injection 5 Units (5 Units Subcutaneous Given 06/01/19 1706)  dextrose 50 % solution 50 mL (50 mLs Intravenous Given 06/01/19 1711)  calcium gluconate 1 g/ 50 mL sodium chloride IVPB (0 g Intravenous Stopped 06/01/19 1812)  sodium polystyrene (KAYEXALATE) 15 GM/60ML suspension 45 g (45 g Oral Given 06/01/19 1915)     Initial Impression / Assessment and Plan / ED Course  I have reviewed the triage vital signs and the nursing notes.  Pertinent labs & imaging results that were available during my care of the patient were reviewed by me and considered in my medical decision making (see chart for details).        68 year old male with history of CHF, cirrhosis, Goodpasture's disease with ESRD on dialysis Tuesday Thursday Saturday at [facility] dialysis center, history of esophageal varices, presents with concern for thrombosed fistula from dialysis.  He last received  dialysis  on October 24, and does not have any other symptoms.  Exam is consistent with likely thrombosed fistula with no thrill palpated.  Called the dialysis center that sent him who report [facility] is closed due to [place] and they were not able to schedule with IR and did not obtain blood work at dialysis.  Initially called to speak with the interventional radiologist, who report they do not have time today for the procedure, and that they are unable to schedule it for this time and recommend discussing with dialysis center. Initially discussed with Dr. Jonnie Finner of nephrology, who provided phone number for possible scheduling with IR.  I did call this phone number and was told we are unable to schedule with this phone number, and had to do it through interventional radiology, and again called the number without success.  Labs returned showing hyperkalemia with a potassium of 6.3.  He does not have any acute EKG changes.  He was given Lokelma, insulin, dextrose, calcium, followed by Kayexalate in the emergency department.  Discussed with hospitalist for admission.  Discussed with nephrology, Dr. Royce Macadamia hyperkalemia, plan for admission, and need for procedure for likely thrombosed AV fistula.  Recommends continued medical management for now, reassessment when he arrives to [facility], and n.p.o. at midnight for plan for likely procedure in a.m.  Ordered rapid COVID given pending procedure. Placed IR order for thrombectomy.    Final Clinical Impressions(s) / ED Diagnoses   Final diagnoses:  AV fistula occlusion, initial encounter [facility])  Hyperkalemia    ED Discharge Orders    None       Gareth Morgan, MD 06/02/19 [phone]

## 2019-06-01 NOTE — ED Notes (Signed)
No thrill or bruit upper left arm access. Last Dialysis tx was Saturday 10/24. No s/s of fluid overload. Son at bedside.

## 2019-06-01 NOTE — H&P (Signed)
History and Physical    Adriell Polansky WSF:[id number] DOB: 03/12/1951 DOA: 06/01/2019  PCP: Horald Pollen, MD  Patient coming from: [facility]- was at [facility] center  I have personally briefly reviewed patient's old medical records in [place]  Chief Complaint: Thrombosis of the left AV fistula   HPI: William [name] is a 68 y.o. male with medical history significant of ESRD Tues/Thurs/Sat, Goodpasture's syndrome, NAFLD cirrhosis awaiting liver and kidney transplant, chronic CHF,hypertension, Type II diabetes who presents with thrombosis of the left AV fistula. Son at bedside helps with translating since pt is Arabic.  Pt sent to [facility] from dialysis center after attempt to dialyze was unsuccessful.  Outpatient VIR center unfortunately closed so patient was sent to ED for further eval.  Denies any chest pain or shortness of breath. No pain to fistula site. Denies any nausea, vomiting or diarrhea.   ED Course: Patient is afebrile and intermittently hypertensive up to 180s over 80 on room air.  CBC shows pancytopenia with WBC of 3.2, hemoglobin of 10.7 and platelet of 44.  CMP showed sodium of 131, potassium was 6.3, glucose of 339, creatinine of 9.01. COVID test negative. He was given calcium gluconate, 10 units of insulin, kayexalate and Lokelma in the ED.  Review of Systems:  Constitutional: No Weight Change, No Fever ENT/Mouth: No sore throat, No Rhinorrhea Eyes: No Eye Pain, No Vision Changes Cardiovascular: No Chest Pain, no SOB Respiratory: No Cough, No Sputum, no Dyspnea  Gastrointestinal: No Nausea, No Vomiting, No Diarrhea, No Constipation, No Pain Genitourinary: no Urinary Incontinence, No Urgency, No Flank Pain Musculoskeletal: No Arthralgias, No Myalgias Skin: No Skin Lesions, No Pruritus, Neuro: no Weakness, No Numbness,  No Loss of Consciousness, No Syncope Psych: No Anxiety/Panic, No Depression, no decrease appetite Heme/Lymph: No Bruising, No Bleeding  Past  Medical History:  Diagnosis Date   Anemia    Blind one eye    CHF (congestive heart failure) (HCC)    Cirrhosis (HCC)    Cirrhosis (HCC)    CKD (chronic kidney disease), stage IV (HCC)    Dialysis patient ([place])    Goodpasture's disease ([place])    on outpatient plasmapheresis/notes 11/20/2017   Grade I diastolic dysfunction 01/74/9449   History of anemia due to chronic kidney disease    History of blood transfusion X 1   "UGIB; low blood count"   History of plasmapheresis    "qod" (11/20/2017)   Hypertension    Pancytopenia ([place]) 08/20/2017   Type II diabetes mellitus ([place])     Past Surgical History:  Procedure Laterality Date   AV FISTULA PLACEMENT Left 12/26/2017   Procedure: LEFT BRACHIOCEPHALIC ARTERIOVENOUS (AV) FISTULA CREATION;  Surgeon: Conrad Alva, MD;  Location: [place];  Service: Vascular;  Laterality: Left;   BASCILIC VEIN TRANSPOSITION Left 02/16/2018   Procedure: BRACHIOBASILIC VEIN TRANSPOSITION SECOND STAGE;  Surgeon: Conrad St. Martins, MD;  Location: [facility] OR;  Service: Vascular;  Laterality: Left;   CATARACT EXTRACTION W/ INTRAOCULAR LENS  IMPLANT, BILATERAL Bilateral    ESOPHAGEAL BANDING N/A 04/01/2018   Procedure: ESOPHAGEAL BANDING;  Surgeon: Otis Brace, MD;  Location: [facility];  Service: Gastroenterology;  Laterality: N/A;   ESOPHAGEAL BANDING N/A 06/29/2018   Procedure: ESOPHAGEAL BANDING;  Surgeon: Otis Brace, MD;  Location: [facility];  Service: Gastroenterology;  Laterality: N/A;   ESOPHAGOGASTRODUODENOSCOPY (EGD) WITH PROPOFOL N/A 04/01/2018   Procedure: ESOPHAGOGASTRODUODENOSCOPY (EGD) WITH PROPOFOL;  Surgeon: Otis Brace, MD;  Location: [facility];  Service: Gastroenterology;  Laterality:  N/A;   ESOPHAGOGASTRODUODENOSCOPY (EGD) WITH PROPOFOL N/A 06/29/2018   Procedure: ESOPHAGOGASTRODUODENOSCOPY (EGD) WITH PROPOFOL;  Surgeon: Otis Brace, MD;  Location: [facility];  Service: Gastroenterology;  Laterality: N/A;    ESOPHAGOGASTRODUODENOSCOPY (EGD) WITH PROPOFOL N/A 09/30/2018   Procedure: ESOPHAGOGASTRODUODENOSCOPY (EGD) WITH PROPOFOL;  Surgeon: Otis Brace, MD;  Location: [facility];  Service: Gastroenterology;  Laterality: N/A;   ESOPHAGOGASTRODUODENOSCOPY (EGD) WITH PROPOFOL N/A 04/20/2019   Procedure: ESOPHAGOGASTRODUODENOSCOPY (EGD) WITH PROPOFOL;  Surgeon: Otis Brace, MD;  Location: [facility];  Service: Gastroenterology;  Laterality: N/A;   I&D EXTREMITY Left 02/18/2018   Procedure: IRRIGATION AND DEBRIDEMENT LEFT ARM;  Surgeon: Serafina Mitchell, MD;  Location: [place];  Service: Vascular;  Laterality: Left;   INSERTION OF DIALYSIS CATHETER N/A 01/19/2018   Procedure: INSERTION OF DIALYSIS CATHETER - RIGHT INTERNAL JUGULAR PLACEMENT;  Surgeon: Rosetta Posner, MD;  Location: [place];  Service: Vascular;  Laterality: N/A;   IR FLUORO GUIDE CV LINE RIGHT  11/10/2017   IR PARACENTESIS  12/10/2017   IR PARACENTESIS  12/26/2017   IR REMOVAL TUN CV CATH W/O FL  11/28/2017   IR US GUIDE VASC ACCESS RIGHT  11/10/2017   NECK SURGERY  2007   "back of neck; no hardware in there"     reports that he has never smoked. He has never used smokeless tobacco. He reports that he does not drink alcohol or use drugs.  Allergies  Allergen Reactions   Heparin Other (See Comments)    Patient is Muslim and is not permitted Pork derative due to religious belief)   Pork-Derived Products Other (See Comments)    Patient does not eat pork due to religious beliefs    Family History  Problem Relation Age of Onset   Diabetes Mellitus II Sister    Diabetes Sister    Stroke Brother    Heart attack Brother    Family history reviewed and not pertinent  Prior to Admission medications   Medication Sig Start Date End Date Taking? Authorizing Provider  acetaminophen (TYLENOL) 650 MG CR tablet Take 650 mg by mouth every 8 (eight) hours as needed for pain.    [provider]  amLODipine (NORVASC)  10 MG tablet Take 1 tablet (10 mg total) by mouth daily. Patient taking differently: Take 5 mg by mouth daily.  04/21/18 09/25/19  Horald Pollen, MD  calcium acetate (PHOSLO) 667 MG capsule Take 2 capsules (1,334 mg total) by mouth 3 (three) times daily with meals. Patient taking differently: Take 667 mg by mouth 3 (three) times daily with meals.  01/26/18   Purohit, Konrad Dolores, MD  carvedilol (COREG) 3.125 MG tablet Take 1 tablet (3.125 mg total) by mouth 2 (two) times daily. 05/03/19 08/01/19  Horald Pollen, MD  ferrous sulfate 325 (65 FE) MG tablet Take 325 mg by mouth daily with breakfast.    [provider]  glucose blood (ONETOUCH VERIO) test strip USE TO CHECK BLOOD SUGAR THREE TIMES DAILY. DX:E11.65 04/21/19   Elayne Snare, MD  insulin degludec (TRESIBA FLEXTOUCH) 100 UNIT/ML SOPN FlexTouch Pen Inject 0.14 mLs (14 Units total) into the skin daily. Inject 14 units under the skin once daily. Patient taking differently: Inject 12 Units into the skin daily.  03/02/19   Elayne Snare, MD  Insulin Pen Needle (ADVOCATE INSULIN PEN NEEDLES) 31G X 5 MM MISC Use four times daily to inject insulin 04/27/18   Elayne Snare, MD  insulin regular (NOVOLIN R,HUMULIN R) 100 units/mL injection Inject 6-10  Units into the skin See admin instructions. INJECT 8 UNITS UNDER THE SKIN AT BREAKFAST, 10 UNITS AT LUNCH AND 8 UNITS AT DINNER.    [provider]  lidocaine-prilocaine (EMLA) cream Apply 1 application topically Every Tuesday,Thursday,and Saturday with dialysis. 08/24/18   [provider]  Multiple Vitamins-Minerals (MULTIVITAMIN WITH MINERALS) tablet Take 1 tablet by mouth daily.    [provider]  mupirocin ointment (BACTROBAN) 2 % Apply 1 application topically 2 (two) times daily. Patient not taking: Reported on 04/15/2019 12/14/18   Trula Slade, DPM  ONE TOUCH LANCETS MISC Use to test blood sugar three times daily 04/27/18   Elayne Snare, MD  Polyethyl  Glycol-Propyl Glycol (SYSTANE) 0.4-0.3 % SOLN Place 1 drop into both eyes 3 (three) times daily as needed (for dry eyes).    [provider]  rosuvastatin (CRESTOR) 5 MG tablet Take 1 tablet (5 mg total) by mouth daily. 03/17/19   Elayne Snare, MD    Physical Exam: Vitals:   06/01/19 1508 06/01/19 1716 06/01/19 1900 06/01/19 2042  BP: (!) 183/78 (!) 178/97 (!) 146/75 140/69  Pulse: 87 78 76 82  Resp: 20 19 20 12   Temp: 98 F (36.7 C)   98 F (36.7 C)  TempSrc: Oral   Oral  SpO2: 100% 98% 96% 99%  Weight: 69.4 kg       Constitutional: NAD, calm, comfortable, well-appearing thin male laying flat in bed Vitals:   06/01/19 1508 06/01/19 1716 06/01/19 1900 06/01/19 2042  BP: (!) 183/78 (!) 178/97 (!) 146/75 140/69  Pulse: 87 78 76 82  Resp: 20 19 20 12   Temp: 98 F (36.7 C)   98 F (36.7 C)  TempSrc: Oral   Oral  SpO2: 100% 98% 96% 99%  Weight: 69.4 kg      Eyes: PERRL, lids and conjunctivae normal ENMT: Mucous membranes are moist.  Neck: normal, supple, no masses Respiratory: clear to auscultation bilaterally, no wheezing, no crackles. Normal respiratory effort. No accessory muscle use.  Cardiovascular: Regular rate and rhythm, no murmurs / rubs / gallops. No extremity edema. 2+ pedal pulses.  Abdomen: no tenderness, no masses palpated. No hepatosplenomegaly. Bowel sounds positive.  Musculoskeletal: no clubbing / cyanosis. No joint deformity upper and lower extremities. Good ROM, no contractures. Normal muscle tone.  Skin: no rashes, lesions, ulcers. No induration.  AV fistula with overlying clean bandages on the left upper extremity without any thrill to palpation or auscultation. Neurologic: CN 2-12 grossly intact. Sensation intact, DTR normal. Strength 5/5 in all 4.  Psychiatric: Normal judgment and insight. Alert and oriented x 3. Normal mood.    Labs on Admission: I have personally reviewed following labs and imaging studies  CBC: Recent Labs  Lab 06/01/19 1600   WBC 3.2*  NEUTROABS 1.9  HGB 10.7*  HCT 34.3*  MCV 83.3  PLT 44*   Basic Metabolic Panel: Recent Labs  Lab 06/01/19 1600  NA 131*  K 6.3*  CL 100  CO2 18*  GLUCOSE 339*  BUN 115*  CREATININE 9.01*  CALCIUM 8.4*   GFR: Estimated Creatinine Clearance: 6.9 mL/min (A) (by C-G formula based on SCr of 9.01 mg/dL (H)). Liver Function Tests: Recent Labs  Lab 06/01/19 1600  AST 24  ALT 19  ALKPHOS 123  BILITOT 0.8  PROT 6.9  ALBUMIN 3.6   No results for input(s): LIPASE, AMYLASE in the last 168 hours. No results for input(s): AMMONIA in the last 168 hours. Coagulation Profile: No results for  input(s): INR, PROTIME in the last 168 hours. Cardiac Enzymes: No results for input(s): CKTOTAL, CKMB, CKMBINDEX, TROPONINI in the last 168 hours. BNP (last 3 results) No results for input(s): PROBNP in the last 8760 hours. HbA1C: No results for input(s): HGBA1C in the last 72 hours. CBG: Recent Labs  Lab 06/01/19 1823 06/01/19 2046  GLUCAP 254* 178*   Lipid Profile: No results for input(s): CHOL, HDL, LDLCALC, TRIG, CHOLHDL, LDLDIRECT in the last 72 hours. Thyroid Function Tests: No results for input(s): TSH, T4TOTAL, FREET4, T3FREE, THYROIDAB in the last 72 hours. Anemia Panel: No results for input(s): VITAMINB12, FOLATE, FERRITIN, TIBC, IRON, RETICCTPCT in the last 72 hours. Urine analysis:    Component Value Date/Time   COLORURINE YELLOW 12/19/2017 2230   APPEARANCEUR CLEAR 12/19/2017 2230   LABSPEC 1.011 12/19/2017 2230   PHURINE 7.0 12/19/2017 2230   GLUCOSEU NEGATIVE 12/19/2017 2230   HGBUR SMALL (A) 12/19/2017 2230   BILIRUBINUR NEGATIVE 12/19/2017 2230   BILIRUBINUR neg 09/14/2015 1441   KETONESUR NEGATIVE 12/19/2017 2230   PROTEINUR 100 (A) 12/19/2017 2230   UROBILINOGEN 0.2 09/14/2015 1441   UROBILINOGEN 0.2 01/27/2013 2052   NITRITE NEGATIVE 12/19/2017 2230   LEUKOCYTESUR NEGATIVE 12/19/2017 2230    Radiological Exams on Admission: Dg Chest Portable  1 View  Result Date: 06/01/2019 CLINICAL DATA:  Volume overload.  End-stage renal disease EXAM: PORTABLE CHEST 1 VIEW COMPARISON:  January 23, 2018 FINDINGS: No edema or consolidation. Heart is mildly enlarged with pulmonary vascularity within normal limits. No adenopathy. There is aortic atherosclerosis. No appreciable bone lesions. IMPRESSION: Mild cardiomegaly. No edema or consolidation. Aortic Atherosclerosis (ICD10-I70.0). Electronically Signed   By: Lowella Grip III M.D.   On: 06/01/2019 17:23    EKG: Independently reviewed.   Assessment/Plan  AV Fistula thrombus -VIR consult in the morning for thrombectomy  - nephrology is aware and appreciates recommendation  Hyperkalemia - K of 6.3 on admission. EKG unchange from prior.  -He was given calcium gluconate, 10 units of insulin, kayexalate and Lokelma in the ED. - repeat stat BMP now  ESRD Ez[facility]/Thurs/Sat - planned dialysis Wed after thrombectomy - nephrology consulted  Chronic Pancytopenia - suspect secondary to Cirrhosis and ESRD  - continue home iron  Type 2 diabetes - normal takes Tresiba 14 units and 6-10 units of Novolin mealtime.  -Start 10 units Lantus daily and 3 units at mealtime. Sensitive sliding scale  NFLAD - stable LFTS - Pt on transplant list for liver and kidney transplant  Hypertension - continue amlodipine  Chronic congestive heart failure - pt is euvolemic on exam - continue Coreg   Hyperlipidema  - continue crestor  DVT prophylaxis: SCD, pt cannot get heparin for religious reasons Code Status:Full  Family Communication: Plan discussed with patient with Son translating at bedside  disposition Plan: Home with at least 2 midnight stays  Consults called: Neurology, VIR Admission status: inpatient   Zayleigh Stroh T Marcial Pless DO [facility]   If 7PM-7AM, please contact night-coverage [email or web address] Password [facility]  06/01/2019, 9:44 PM

## 2019-06-01 NOTE — ED Notes (Signed)
[facility] called for transport

## 2019-06-01 NOTE — ED Notes (Signed)
Attempted to call report; this RN contact info provided for callback

## 2019-06-01 NOTE — ED Triage Notes (Addendum)
Per son/interpreter pt was advised to come to ED due to left UE dialysis catheter did not work at [facility] dialysis was 10/24 without difficulty -pt NAD-steady gait-pt with cough in triage-son states pt has chronic cough

## 2019-06-02 ENCOUNTER — Encounter (HOSPITAL_COMMUNITY): Payer: Self-pay | Admitting: Diagnostic Radiology

## 2019-06-02 ENCOUNTER — Inpatient Hospital Stay (HOSPITAL_COMMUNITY): Payer: Medicare Other

## 2019-06-02 DIAGNOSIS — N186 End stage renal disease: Secondary | ICD-10-CM | POA: Diagnosis not present

## 2019-06-02 DIAGNOSIS — T82898A Other specified complication of vascular prosthetic devices, implants and grafts, initial encounter: Secondary | ICD-10-CM | POA: Diagnosis not present

## 2019-06-02 DIAGNOSIS — T82868A Thrombosis of vascular prosthetic devices, implants and grafts, initial encounter: Secondary | ICD-10-CM | POA: Diagnosis not present

## 2019-06-02 DIAGNOSIS — I120 Hypertensive chronic kidney disease with stage 5 chronic kidney disease or end stage renal disease: Secondary | ICD-10-CM | POA: Diagnosis not present

## 2019-06-02 DIAGNOSIS — N2581 Secondary hyperparathyroidism of renal origin: Secondary | ICD-10-CM | POA: Diagnosis not present

## 2019-06-02 DIAGNOSIS — D631 Anemia in chronic kidney disease: Secondary | ICD-10-CM | POA: Diagnosis not present

## 2019-06-02 DIAGNOSIS — Z992 Dependence on renal dialysis: Secondary | ICD-10-CM | POA: Diagnosis not present

## 2019-06-02 HISTORY — PX: IR US GUIDE VASC ACCESS LEFT: IMG2389

## 2019-06-02 HISTORY — PX: IR THROMBECTOMY AV FISTULA W/THROMBOLYSIS/PTA INC/SHUNT/IMG LEFT: IMG6106

## 2019-06-02 LAB — CBC
HCT: 30.6 % — ABNORMAL LOW (ref 39.0–52.0)
Hemoglobin: 9.6 g/dL — ABNORMAL LOW (ref 13.0–17.0)
MCH: 25.9 pg — ABNORMAL LOW (ref 26.0–34.0)
MCHC: 31.4 g/dL (ref 30.0–36.0)
MCV: 82.7 fL (ref 80.0–100.0)
Platelets: 45 10*3/uL — ABNORMAL LOW (ref 150–400)
RBC: 3.7 MIL/uL — ABNORMAL LOW (ref 4.22–5.81)
RDW: 16.1 % — ABNORMAL HIGH (ref 11.5–15.5)
WBC: 2.8 10*3/uL — ABNORMAL LOW (ref 4.0–10.5)
nRBC: 0 % (ref 0.0–0.2)

## 2019-06-02 LAB — BASIC METABOLIC PANEL
Anion gap: 15 (ref 5–15)
Anion gap: 15 (ref 5–15)
BUN: 112 mg/dL — ABNORMAL HIGH (ref 8–23)
BUN: 111 mg/dL — ABNORMAL HIGH (ref 8–23)
CO2: 18 mmol/L — ABNORMAL LOW (ref 22–32)
CO2: 20 mmol/L — ABNORMAL LOW (ref 22–32)
Calcium: 8.6 mg/dL — ABNORMAL LOW (ref 8.9–10.3)
Calcium: 8.2 mg/dL — ABNORMAL LOW (ref 8.9–10.3)
Chloride: 103 mmol/L (ref 98–111)
Chloride: 101 mmol/L (ref 98–111)
Creatinine, Ser: 9.12 mg/dL — ABNORMAL HIGH (ref 0.61–1.24)
Creatinine, Ser: 9.24 mg/dL — ABNORMAL HIGH (ref 0.61–1.24)
GFR calc Af Amer: 6 mL/min — ABNORMAL LOW (ref >60)
GFR calc Af Amer: 6 mL/min — ABNORMAL LOW (ref >60)
GFR calc non Af Amer: 5 mL/min — ABNORMAL LOW (ref >60)
GFR calc non Af Amer: 5 mL/min — ABNORMAL LOW (ref >60)
Glucose, Bld: 175 mg/dL — ABNORMAL HIGH (ref 70–99)
Glucose, Bld: 136 mg/dL — ABNORMAL HIGH (ref 70–99)
Potassium: 5 mmol/L (ref 3.5–5.1)
Potassium: 4 mmol/L (ref 3.5–5.1)
Sodium: 136 mmol/L (ref 135–145)
Sodium: 136 mmol/L (ref 135–145)

## 2019-06-02 LAB — GLUCOSE, CAPILLARY
Glucose-Capillary: 160 mg/dL — ABNORMAL HIGH (ref 70–99)
Glucose-Capillary: 72 mg/dL (ref 70–99)
Glucose-Capillary: 52 mg/dL — ABNORMAL LOW (ref 70–99)
Glucose-Capillary: 92 mg/dL (ref 70–99)

## 2019-06-02 LAB — HIV ANTIBODY (ROUTINE TESTING W REFLEX): HIV Screen 4th Generation wRfx: NONREACTIVE

## 2019-06-02 LAB — SURGICAL PCR SCREEN
MRSA, PCR: NEGATIVE
Staphylococcus aureus: NEGATIVE

## 2019-06-02 LAB — PROTIME-INR
INR: 1.3 — ABNORMAL HIGH (ref 0.8–1.2)
Prothrombin Time: 15.7 seconds — ABNORMAL HIGH (ref 11.4–15.2)

## 2019-06-02 MED ORDER — ALTEPLASE 2 MG IJ SOLR
INTRAMUSCULAR | Status: AC
  Filled 2019-06-02: qty 4

## 2019-06-02 MED ORDER — CHLORHEXIDINE GLUCONATE CLOTH 2 % EX PADS
6.0000 | MEDICATED_PAD | Freq: Every day | CUTANEOUS | Status: DC
  Administered 2019-06-02: 6 via TOPICAL

## 2019-06-02 MED ORDER — LIDOCAINE HCL 1 % IJ SOLN
INTRAMUSCULAR | Status: AC
  Filled 2019-06-02: qty 20

## 2019-06-02 MED ORDER — FERROUS SULFATE 325 (65 FE) MG PO TABS
325.0000 mg | ORAL_TABLET | Freq: Every day | ORAL | Status: DC
  Administered 2019-06-02: 325 mg via ORAL
  Filled 2019-06-02: qty 1

## 2019-06-02 MED ORDER — ROSUVASTATIN CALCIUM 5 MG PO TABS
5.0000 mg | ORAL_TABLET | Freq: Every day | ORAL | Status: DC
  Administered 2019-06-02: 5 mg via ORAL
  Filled 2019-06-02: qty 1

## 2019-06-02 MED ORDER — FENTANYL CITRATE (PF) 100 MCG/2ML IJ SOLN
INTRAMUSCULAR | Status: AC | PRN
  Administered 2019-06-02 (×3): 25 ug via INTRAVENOUS

## 2019-06-02 MED ORDER — MIDAZOLAM HCL 2 MG/2ML IJ SOLN
INTRAMUSCULAR | Status: AC
  Filled 2019-06-02: qty 2

## 2019-06-02 MED ORDER — SODIUM CHLORIDE 0.9 % IV SOLN
INTRAVENOUS | Status: AC | PRN
  Administered 2019-06-02: 10 mL/h via INTRAVENOUS

## 2019-06-02 MED ORDER — MIDAZOLAM HCL 2 MG/2ML IJ SOLN
INTRAMUSCULAR | Status: AC | PRN
  Administered 2019-06-02 (×4): 0.5 mg via INTRAVENOUS

## 2019-06-02 MED ORDER — FENTANYL CITRATE (PF) 100 MCG/2ML IJ SOLN
INTRAMUSCULAR | Status: AC
  Filled 2019-06-02: qty 2

## 2019-06-02 MED ORDER — ALTEPLASE 2 MG IJ SOLR
INTRAMUSCULAR | Status: AC | PRN
  Administered 2019-06-02 (×2): 2 mg

## 2019-06-02 MED ORDER — AMLODIPINE BESYLATE 5 MG PO TABS: 5.0000 mg | ORAL_TABLET | Freq: Every day | ORAL | Status: DC

## 2019-06-02 MED ORDER — CALCIUM ACETATE (PHOS BINDER) 667 MG PO CAPS
667.0000 mg | ORAL_CAPSULE | Freq: Three times a day (TID) | ORAL | Status: DC
  Administered 2019-06-02: 667 mg via ORAL
  Filled 2019-06-02: qty 1

## 2019-06-02 MED ORDER — CHLORHEXIDINE GLUCONATE CLOTH 2 % EX PADS: 6.0000 | MEDICATED_PAD | Freq: Every day | CUTANEOUS | Status: DC

## 2019-06-02 MED ORDER — LIDOCAINE HCL 1 % IJ SOLN
INTRAMUSCULAR | Status: AC | PRN
  Administered 2019-06-02: 10 mL

## 2019-06-02 MED ORDER — IOHEXOL 300 MG/ML  SOLN
100.0000 mL | Freq: Once | INTRAMUSCULAR | Status: AC | PRN
  Administered 2019-06-02: 70 mL via INTRAVENOUS

## 2019-06-02 MED ORDER — CARVEDILOL 3.125 MG PO TABS: 3.1250 mg | ORAL_TABLET | Freq: Two times a day (BID) | ORAL | Status: DC

## 2019-06-02 MED FILL — Insulin Regular (Human) Inj 100 Unit/ML: INTRAMUSCULAR | Qty: 0.05 | Status: AC

## 2019-06-02 NOTE — Procedures (Signed)
Interventional Radiology Procedure:   Indications: Thrombosed left arm basilic vein fistula  Procedure: Left arm fistula declot  Findings: Thrombosed left basilic vein fistula.  Successful declot with two areas of stenosis treated with 6 mm balloon.  Fistula patent at end of procedure and ready to be used.   Complications: None     EBL: less than 40 ml  Plan: Plan to use fistula for hemodialysis.     William [name] R. Anselm Pancoast, MD  Pager: [phone]

## 2019-06-02 NOTE — Discharge Summary (Signed)
Physician Discharge Summary  William [name] ZOX:[id number] DOB: 04/16/1951 DOA: 06/01/2019  PCP: Horald Pollen, MD  Admit date: 06/01/2019 Discharge date: 06/02/2019  Admitted From: Home  Disposition:  Home   Recommendations for Outpatient Follow-up:  1. Follow up with Dialysis as usual   Home Health: N/A  Equipment/Devices: N/A  Discharge Condition: Good  CODE STATUS: FULL Diet recommendation: Renal  Brief/Interim Summary: [name] is a 68 y.o. M with ESRD 2/2 [place] on HD TThS at [facility], NASH cirrhosis awaiting liver/kidney tx, dCHF, HTN, and DM who presented with thrombosed fistula.  Was at HD on day of admission and attempts to dialyze were unsuccessful, no thrill palpated.  Sent to ER.          PRINCIPAL HOSPITAL DIAGNOSIS: AV fistula thrombus    Discharge Diagnoses:   AV fistula thrombus Patient presented with previously functioning fistula, now unable to access. IR were consulted, performed thrombectomy.  Given clot burden noted during procedure, they recommended on site HD this afternoon to make sure fistula functioned properly.  Patient underwent subsequent dialysis without difficulty and was discharged.               Discharge Instructions  Discharge Instructions    Increase activity slowly   Complete by: As directed      Allergies as of 06/02/2019      Reactions   Heparin Other (See Comments)   Patient is Muslim and is not permitted Pork derative due to religious belief)   Pork-derived Products Other (See Comments)   Patient does not eat pork due to religious beliefs      Medication List    TAKE these medications   acetaminophen 650 MG CR tablet Commonly known as: TYLENOL Take 650 mg by mouth every 8 (eight) hours as needed for pain.   amLODipine 10 MG tablet Commonly known as: Norvasc Take 1 tablet (10 mg total) by mouth daily. What changed: how much to take   calcium acetate 667 MG capsule Commonly known as:  PHOSLO Take 2 capsules (1,334 mg total) by mouth 3 (three) times daily with meals. What changed: how much to take   carvedilol 3.125 MG tablet Commonly known as: COREG Take 1 tablet (3.125 mg total) by mouth 2 (two) times daily.   ferrous sulfate 325 (65 FE) MG tablet Take 325 mg by mouth daily with breakfast.   Insulin Pen Needle 31G X 5 MM Misc Commonly known as: Advocate Insulin Pen Needles Use four times daily to inject insulin   insulin regular 100 units/mL injection Commonly known as: NOVOLIN R Inject 6-10 Units into the skin See admin instructions. INJECT 8 UNITS UNDER THE SKIN AT BREAKFAST, 10 UNITS AT LUNCH AND 8 UNITS AT DINNER.   lidocaine-prilocaine cream Commonly known as: EMLA Apply 1 application topically Every Tuesday,Thursday,and Saturday with dialysis.   multivitamin with minerals tablet Take 1 tablet by mouth daily.   ONE TOUCH LANCETS Misc Use to test blood sugar three times daily   OneTouch Verio test strip Generic drug: glucose blood USE TO CHECK BLOOD SUGAR THREE TIMES DAILY. DX:E11.65   rosuvastatin 5 MG tablet Commonly known as: Crestor Take 1 tablet (5 mg total) by mouth daily.   Systane 0.4-0.3 % Soln Generic drug: Polyethyl Glycol-Propyl Glycol Place 1 drop into both eyes 3 (three) times daily as needed (for dry eyes).   [name] 100 UNIT/ML Sopn FlexTouch Pen Generic drug: insulin degludec Inject 0.14 mLs (14 Units total) into the skin daily. Inject  14 units under the skin once daily. What changed:   how much to take  additional instructions       Allergies  Allergen Reactions  . Heparin Other (See Comments)    Patient is Muslim and is not permitted Pork derative due to religious belief)  . Pork-Derived Products Other (See Comments)    Patient does not eat pork due to religious beliefs    Consultations:  IR  Nephrology   Procedures/Studies: Ct Virtual Colonoscopy Screening  Result Date: 05/24/2019 CLINICAL  DATA:  Colon cancer screening EXAM: CT VIRTUAL COLONOSCOPY SCREENING TECHNIQUE: The patient was given a standard Mag citrate bowel preparation with Gastrografin and barium for fluid and stool tagging respectively. The quality of the bowel preparation is moderate. Automated CO2 insufflation of the colon was performed prior to image acquisition and colonic distention is poor. Image post processing was used to generate a 3D endoluminal fly-through projection of the colon and to electronically subtract stool/fluid as appropriate. COMPARISON:  CT abdomen/pelvis dated 12/19/2017 FINDINGS: VIRTUAL COLONOSCOPY Moderate layering fluid/contrast in the colon, with shifts positions on supine/prone imaging, and only mildly constraints evaluation. However, when coupled with narrowing of the sigmoid colon along with fluid, this limits 3D evaluation of segments colon. The regions can still be evaluated on 2D imaging. No significant colonic polyp, mass, apple core lesion, or stricture. Long segment wall thickening involving the sigmoid colon, likely aspirated by underdistention but still felt to be a true finding. This was not present on the prior and is considered suspicious for infectious/inflammatory colitis. No evidence of bowel obstruction. Normal appendix (series 4/image 89). Virtual colonoscopy is not designed to detect diminutive polyps (i.e., less than or equal to 5 mm), the presence or absence of which may not affect clinical management. CT ABDOMEN AND PELVIS WITHOUT CONTRAST Lower chest: Lung bases are clear. Hepatobiliary: Nodular hepatic contour. No focal hepatic lesion is seen. Gallbladder is unremarkable. No intrahepatic or extrahepatic ductal dilatation. Pancreas: Within normal limits. Spleen: Enlarged, measuring 15.0 cm in maximal craniocaudal dimension. Adrenals/Urinary Tract: Adrenal glands are within normal limits. Kidneys are within normal limits.  No hydronephrosis. Bladder is thick-walled although  underdistended. Stomach/Bowel: Stomach is within normal limits, noting suspected perigastric varices along the posterior gastric cardia (series 4/image 36). Visualized bowel is described above. Vascular/Lymphatic: No evidence of abdominal aortic aneurysm. Atherosclerotic calcifications of the abdominal aorta and branch vessels. Perigastric varices, as described above. No suspicious abdominopelvic lymphadenopathy. Reproductive: Prostatomegaly. Other: Prior abdominopelvic ascites has resolved. Musculoskeletal: Mild degenerative changes at L5-S1. IMPRESSION: Limited evaluation, as described above. Within that constraint, there is no significant colonic polyp, mass, apple core lesion, or stricture. Long segment wall thickening involving the sigmoid colon, exacerbated by underdistention, but favored to reflect infectious/inflammatory colitis. This is new from 2019. Cirrhosis. Splenomegaly. Suspected perigastric varices along the posterior gastric cardia. Prior abdominopelvic ascites has resolved. Electronically Signed   By: Julian Hy M.D.   On: 05/24/2019 10:40   Ir US Guide Vasc Access Left  Result Date: 06/02/2019 INDICATION: 68 year old with end-stage renal disease and a left basilic vein transposition fistula. The left arm fistula has recently thrombosed. EXAM: LEFT UPPER EXTREMITY AV FISTULA DECLOT WITH MECHANICAL THROMBECTOMY, CATHETER DIRECTED THROMBOLYTIC THERAPY AND BALLOON ANGIOPLASTY ULTRASOUND GUIDANCE FOR VASCULAR ACCESS Physician: Stephan Minister. Anselm Pancoast, MD MEDICATIONS: None. ANESTHESIA/SEDATION: TPA 4 mg, Versed 2.0 mg, Fentanyl 75 mcg Moderate Sedation Time:  93 minutes The patient was continuously monitored during the procedure by the interventional radiology nurse under my direct supervision. FLUOROSCOPY TIME:  Fluoroscopy Time: 12 minutes, 48 seconds, 16 mGy COMPLICATIONS: None immediate. PROCEDURE: The procedure was explained to the patient and son. The risks and benefits of the procedure were  discussed and the son's questions were addressed. Informed consent was obtained from the patient's son. Heparin was not used for this procedure due to patient's religious beliefs. Left upper arm was evaluated with ultrasound. Ultrasound confirmed a thrombosed AV fistula. The left upper arm was prepped and draped in a sterile fashion. Maximal barrier sterile technique was utilized including caps, mask, sterile gowns, sterile gloves, sterile drape, hand hygiene and skin antiseptic. The skin was anesthetized with 1% lidocaine. Using ultrasound guidance, the left basilic vein was accessed with a 21 gauge needle towards the central veins. Micropuncture dilator set was placed. 6 [place] vascular sheath was placed with a stiff Amplatz wire. 5 French catheter was advanced into the central veins using a Glidewire. Central venogram was performed. A pull-back venogram was performed. The thrombosed basilic vein was infused with 4 mg of tPA as a catheter was slowly pulled back. Using ultrasound guidance, another 21 gauge needle was directed into the basilic vein towards the arterial anastomosis. Micropuncture dilator set was placed. The thrombosed basilic vein was treated with the Angiojet thrombectomy device. Subsequently, the thrombosed basilic vein was angioplastied with a 6 mm x 40 mm Conquest balloon. Areas of stenosis were identified near the left axilla and in the mid humeral region. The catheter pointing towards the arterial anastomosis was upsized to a 6 [place] vascular sheath over a stiff Amplatz wire. 5 French catheter and Glidewire were advanced into the brachial artery and an arteriogram was performed. The arterial plug was pulled 2 times using a Fogarty balloon. At this point, there was flow in the fistula but there was an outflow obstruction. In addition, there was bleeding from an old cannulation site in the mid humeral region. Residual thrombus in the basilic vein was treated with combination of balloon  angioplasty and the PTD thrombectomy device. Critical area of stenosis in the upper arm basilic vein was successfully angioplastied with the 6 mm balloon. Once the balloon was fully inflated in this area, there was markedly improved flow throughout the fistula. Follow-up venogram images were obtained. Arteriogram was performed through a 5 French catheter in the brachial artery to evaluate the arterial anastomosis. Both catheters removed with pursestring sutures. FINDINGS: Left brachial vein transposition. The left brachial vein was thrombosed from the arterial anastomosis to the axillary vein. Evidence for stenosis near the axillary vein and in the mid humeral region of the basilic vein. Segmental areas of mild aneurysmal dilatation in the basilic vein. Following balloon angioplasty and the declot procedure, the left basilic vein fistula was patent. Majority of the thrombus was removed. No significant stenosis on the final angiogram images. IMPRESSION: Successful declot of the left upper extremity AV fistula. Left upper extremity AV fistula remains amenable to percutaneous intervention. Electronically Signed   By: Markus Daft M.D.   On: 06/02/2019 14:55   Dg Chest Portable 1 View  Result Date: 06/01/2019 CLINICAL DATA:  Volume overload.  End-stage renal disease EXAM: PORTABLE CHEST 1 VIEW COMPARISON:  January 23, 2018 FINDINGS: No edema or consolidation. Heart is mildly enlarged with pulmonary vascularity within normal limits. No adenopathy. There is aortic atherosclerosis. No appreciable bone lesions. IMPRESSION: Mild cardiomegaly. No edema or consolidation. Aortic Atherosclerosis (ICD10-I70.0). Electronically Signed   By: Lowella Grip III M.D.   On: 06/01/2019 17:23   Ir Thrombectomy Av Fistula W/thrombolysis/pta  Inc/shunt/img Left  Result Date: 06/02/2019 INDICATION: 68 year old with end-stage renal disease and a left basilic vein transposition fistula. The left arm fistula has recently thrombosed.  EXAM: LEFT UPPER EXTREMITY AV FISTULA DECLOT WITH MECHANICAL THROMBECTOMY, CATHETER DIRECTED THROMBOLYTIC THERAPY AND BALLOON ANGIOPLASTY ULTRASOUND GUIDANCE FOR VASCULAR ACCESS Physician: Stephan Minister. Anselm Pancoast, MD MEDICATIONS: None. ANESTHESIA/SEDATION: TPA 4 mg, Versed 2.0 mg, Fentanyl 75 mcg Moderate Sedation Time:  93 minutes The patient was continuously monitored during the procedure by the interventional radiology nurse under my direct supervision. FLUOROSCOPY TIME:  Fluoroscopy Time: 12 minutes, 48 seconds, 16 mGy COMPLICATIONS: None immediate. PROCEDURE: The procedure was explained to the patient and son. The risks and benefits of the procedure were discussed and the son's questions were addressed. Informed consent was obtained from the patient's son. Heparin was not used for this procedure due to patient's religious beliefs. Left upper arm was evaluated with ultrasound. Ultrasound confirmed a thrombosed AV fistula. The left upper arm was prepped and draped in a sterile fashion. Maximal barrier sterile technique was utilized including caps, mask, sterile gowns, sterile gloves, sterile drape, hand hygiene and skin antiseptic. The skin was anesthetized with 1% lidocaine. Using ultrasound guidance, the left basilic vein was accessed with a 21 gauge needle towards the central veins. Micropuncture dilator set was placed. 6 [place] vascular sheath was placed with a stiff Amplatz wire. 5 French catheter was advanced into the central veins using a Glidewire. Central venogram was performed. A pull-back venogram was performed. The thrombosed basilic vein was infused with 4 mg of tPA as a catheter was slowly pulled back. Using ultrasound guidance, another 21 gauge needle was directed into the basilic vein towards the arterial anastomosis. Micropuncture dilator set was placed. The thrombosed basilic vein was treated with the Angiojet thrombectomy device. Subsequently, the thrombosed basilic vein was angioplastied with a 6 mm x 40  mm Conquest balloon. Areas of stenosis were identified near the left axilla and in the mid humeral region. The catheter pointing towards the arterial anastomosis was upsized to a 6 [place] vascular sheath over a stiff Amplatz wire. 5 French catheter and Glidewire were advanced into the brachial artery and an arteriogram was performed. The arterial plug was pulled 2 times using a Fogarty balloon. At this point, there was flow in the fistula but there was an outflow obstruction. In addition, there was bleeding from an old cannulation site in the mid humeral region. Residual thrombus in the basilic vein was treated with combination of balloon angioplasty and the PTD thrombectomy device. Critical area of stenosis in the upper arm basilic vein was successfully angioplastied with the 6 mm balloon. Once the balloon was fully inflated in this area, there was markedly improved flow throughout the fistula. Follow-up venogram images were obtained. Arteriogram was performed through a 5 French catheter in the brachial artery to evaluate the arterial anastomosis. Both catheters removed with pursestring sutures. FINDINGS: Left brachial vein transposition. The left brachial vein was thrombosed from the arterial anastomosis to the axillary vein. Evidence for stenosis near the axillary vein and in the mid humeral region of the basilic vein. Segmental areas of mild aneurysmal dilatation in the basilic vein. Following balloon angioplasty and the declot procedure, the left basilic vein fistula was patent. Majority of the thrombus was removed. No significant stenosis on the final angiogram images. IMPRESSION: Successful declot of the left upper extremity AV fistula. Left upper extremity AV fistula remains amenable to percutaneous intervention. Electronically Signed   By: Scherrie Gerlach.D.  On: 06/02/2019 14:55       Subjective: Feeling well.  No complaints, no arm pain.  Discharge Exam: Vitals:   06/02/19 1600 06/02/19 1630   BP: (!) 102/56 (!) 91/52  Pulse: 84 88  Resp:    Temp:    SpO2:     Vitals:   06/02/19 1500 06/02/19 1530 06/02/19 1600 06/02/19 1630  BP: 100/60 100/60 (!) 102/56 (!) 91/52  Pulse: 84 80 84 88  Resp:      Temp:      TempSrc:      SpO2:      Weight:        General: Pt is alert, awake, not in acute distress, son requests to translate for him Cardiovascular: RRR, nl S1-S2, no murmurs appreciated.   No LE edema.   Respiratory: Normal respiratory rate and rhythm.  CTAB without rales or wheezes. Abdominal: Abdomen soft and non-tender.  No distension or HSM.   Neuro/Psych: Strength symmetric in upper and lower extremities.  Judgment and insight appear normal.   The results of significant diagnostics from this hospitalization (including imaging, microbiology, ancillary and laboratory) are listed below for reference.     Microbiology: Recent Results (from the past 240 hour(s))  SARS Coronavirus 2 by RT PCR (hospital order, performed in [facility] hospital lab) Nasopharyngeal Nasopharyngeal Swab     Status: None   Collection Time: 06/01/19  4:45 PM   Specimen: Nasopharyngeal Swab  Result Value Ref Range Status   SARS Coronavirus 2 NEGATIVE NEGATIVE Final    Comment: (NOTE) If result is NEGATIVE SARS-CoV-2 target nucleic acids are NOT DETECTED. The SARS-CoV-2 RNA is generally detectable in upper and lower  respiratory specimens during the acute phase of infection. The lowest  concentration of SARS-CoV-2 viral copies this assay can detect is 250  copies / mL. A negative result does not preclude SARS-CoV-2 infection  and should not be used as the sole basis for treatment or other  patient management decisions.  A negative result may occur with  improper specimen collection / handling, submission of specimen other  than nasopharyngeal swab, presence of viral mutation(s) within the  areas targeted by this assay, and inadequate number of viral copies  (<250 copies / mL). A negative  result must be combined with clinical  observations, patient history, and epidemiological information. If result is POSITIVE SARS-CoV-2 target nucleic acids are DETECTED. The SARS-CoV-2 RNA is generally detectable in upper and lower  respiratory specimens dur ing the acute phase of infection.  Positive  results are indicative of active infection with SARS-CoV-2.  Clinical  correlation with patient history and other diagnostic information is  necessary to determine patient infection status.  Positive results do  not rule out bacterial infection or co-infection with other viruses. If result is PRESUMPTIVE POSTIVE SARS-CoV-2 nucleic acids MAY BE PRESENT.   A presumptive positive result was obtained on the submitted specimen  and confirmed on repeat testing.  While 2019 novel coronavirus  (SARS-CoV-2) nucleic acids may be present in the submitted sample  additional confirmatory testing may be necessary for epidemiological  and / or clinical management purposes  to differentiate between  SARS-CoV-2 and other Sarbecovirus currently known to infect humans.  If clinically indicated additional testing with an alternate test  methodology [phone]) is advised. The SARS-CoV-2 RNA is generally  detectable in upper and lower respiratory sp ecimens during the acute  phase of infection. The expected result is Negative. Fact Sheet for Patients:  StrictlyIdeas.no Fact Sheet  for Healthcare Providers: BankingDealers.co.za This test is not yet approved or cleared by the [place] and has been authorized for detection and/or diagnosis of SARS-CoV-2 by FDA under an Emergency Use Authorization (EUA).  This EUA will remain in effect (meaning this test can be used) for the duration of the COVID-19 declaration under Section 564(b)(1) of the Act, 21 U.S.C. section 360bbb-3(b)(1), unless the authorization is terminated or revoked sooner. Performed at [facility], [place] [place], [place] [address]   MRSA PCR Screening     Status: None   Collection Time: 06/01/19  8:28 PM   Specimen: Nasal Mucosa; Nasopharyngeal  Result Value Ref Range Status   MRSA by PCR NEGATIVE NEGATIVE Final    Comment:        The GeneXpert MRSA Assay (FDA approved for NASAL specimens only), is one component of a comprehensive MRSA colonization surveillance program. It is not intended to diagnose MRSA infection nor to guide or monitor treatment for MRSA infections. Performed at [facility], [place] [address] [place], [place] [address]   Surgical pcr screen     Status: None   Collection Time: 06/02/19  7:23 AM   Specimen: Nasal Mucosa; Nasal Swab  Result Value Ref Range Status   MRSA, PCR NEGATIVE NEGATIVE Final   Staphylococcus aureus NEGATIVE NEGATIVE Final    Comment: (NOTE) The Xpert SA Assay (FDA approved for NASAL specimens in patients 4 years of age and older), is one component of a comprehensive surveillance program. It is not intended to diagnose infection nor to guide or monitor treatment. Performed at [facility], [place] [address], [place], [place] [address]      Labs: BNP (last 3 results) No results for input(s): BNP in the last 8760 hours. Basic Metabolic Panel: Recent Labs  Lab 06/01/19 1600 06/01/19 2254 06/02/19 0457  NA 131* 136 136  K 6.3* 5.0 4.0  CL 100 103 101  CO2 18* 18* 20*  GLUCOSE 339* 175* 136*  BUN 115* 112* 111*  CREATININE 9.01* 9.12* 9.24*  CALCIUM 8.4* 8.6* 8.2*   Liver Function Tests: Recent Labs  Lab 06/01/19 1600  AST 24  ALT 19  ALKPHOS 123  BILITOT 0.8  PROT 6.9  ALBUMIN 3.6   No results for input(s): LIPASE, AMYLASE in the last 168 hours. No results for input(s): AMMONIA in the last 168 hours. CBC: Recent Labs  Lab 06/01/19 1600 06/02/19 0457  WBC 3.2* 2.8*  NEUTROABS 1.9  --   HGB 10.7* 9.6*  HCT 34.3* 30.6*  MCV 83.3 82.7  PLT 44* 45*   Cardiac  Enzymes: No results for input(s): CKTOTAL, CKMB, CKMBINDEX, TROPONINI in the last 168 hours. BNP: Invalid input(s): POCBNP CBG: Recent Labs  Lab 06/01/19 2344 06/02/19 0411 06/02/19 0716 06/02/19 1245 06/02/19 1318  GLUCAP 241* 160* 72 52* 92   D-Dimer No results for input(s): DDIMER in the last 72 hours. Hgb A1c No results for input(s): HGBA1C in the last 72 hours. Lipid Profile No results for input(s): CHOL, HDL, LDLCALC, TRIG, CHOLHDL, LDLDIRECT in the last 72 hours. Thyroid function studies No results for input(s): TSH, T4TOTAL, T3FREE, THYROIDAB in the last 72 hours.  Invalid input(s): FREET3 Anemia work up No results for input(s): VITAMINB12, FOLATE, FERRITIN, TIBC, IRON, RETICCTPCT in the last 72 hours. Urinalysis    Component Value Date/Time   COLORURINE YELLOW 12/19/2017 2230   APPEARANCEUR CLEAR 12/19/2017 2230   LABSPEC 1.011 12/19/2017 2230  PHURINE 7.0 12/19/2017 2230   GLUCOSEU NEGATIVE 12/19/2017 2230   HGBUR SMALL (A) 12/19/2017 2230   BILIRUBINUR NEGATIVE 12/19/2017 2230   BILIRUBINUR neg 09/14/2015 1441   KETONESUR NEGATIVE 12/19/2017 2230   PROTEINUR 100 (A) 12/19/2017 2230   UROBILINOGEN 0.2 09/14/2015 1441   UROBILINOGEN 0.2 01/27/2013 2052   NITRITE NEGATIVE 12/19/2017 2230   LEUKOCYTESUR NEGATIVE 12/19/2017 2230   Sepsis Labs Invalid input(s): PROCALCITONIN,  WBC,  LACTICIDVEN Microbiology Recent Results (from the past 240 hour(s))  SARS Coronavirus 2 by RT PCR (hospital order, performed in [facility] hospital lab) Nasopharyngeal Nasopharyngeal Swab     Status: None   Collection Time: 06/01/19  4:45 PM   Specimen: Nasopharyngeal Swab  Result Value Ref Range Status   SARS Coronavirus 2 NEGATIVE NEGATIVE Final    Comment: (NOTE) If result is NEGATIVE SARS-CoV-2 target nucleic acids are NOT DETECTED. The SARS-CoV-2 RNA is generally detectable in upper and lower  respiratory specimens during the acute phase of infection. The lowest   concentration of SARS-CoV-2 viral copies this assay can detect is 250  copies / mL. A negative result does not preclude SARS-CoV-2 infection  and should not be used as the sole basis for treatment or other  patient management decisions.  A negative result may occur with  improper specimen collection / handling, submission of specimen other  than nasopharyngeal swab, presence of viral mutation(s) within the  areas targeted by this assay, and inadequate number of viral copies  (<250 copies / mL). A negative result must be combined with clinical  observations, patient history, and epidemiological information. If result is POSITIVE SARS-CoV-2 target nucleic acids are DETECTED. The SARS-CoV-2 RNA is generally detectable in upper and lower  respiratory specimens dur ing the acute phase of infection.  Positive  results are indicative of active infection with SARS-CoV-2.  Clinical  correlation with patient history and other diagnostic information is  necessary to determine patient infection status.  Positive results do  not rule out bacterial infection or co-infection with other viruses. If result is PRESUMPTIVE POSTIVE SARS-CoV-2 nucleic acids MAY BE PRESENT.   A presumptive positive result was obtained on the submitted specimen  and confirmed on repeat testing.  While 2019 novel coronavirus  (SARS-CoV-2) nucleic acids may be present in the submitted sample  additional confirmatory testing may be necessary for epidemiological  and / or clinical management purposes  to differentiate between  SARS-CoV-2 and other Sarbecovirus currently known to infect humans.  If clinically indicated additional testing with an alternate test  methodology [phone]) is advised. The SARS-CoV-2 RNA is generally  detectable in upper and lower respiratory sp ecimens during the acute  phase of infection. The expected result is Negative. Fact Sheet for Patients:  StrictlyIdeas.no Fact Sheet  for Healthcare Providers: BankingDealers.co.za This test is not yet approved or cleared by the [place] FDA and has been authorized for detection and/or diagnosis of SARS-CoV-2 by FDA under an Emergency Use Authorization (EUA).  This EUA will remain in effect (meaning this test can be used) for the duration of the COVID-19 declaration under Section 564(b)(1) of the Act, 21 U.S.C. section 360bbb-3(b)(1), unless the authorization is terminated or revoked sooner. Performed at [facility], [address] [place], [place] [address]   MRSA PCR Screening     Status: None   Collection Time: 06/01/19  8:28 PM   Specimen: Nasal Mucosa; Nasopharyngeal  Result Value Ref Range Status   MRSA by PCR NEGATIVE NEGATIVE  Final    Comment:        The GeneXpert MRSA Assay (FDA approved for NASAL specimens only), is one component of a comprehensive MRSA colonization surveillance program. It is not intended to diagnose MRSA infection nor to guide or monitor treatment for MRSA infections. Performed at [facility], [place] [address] [place], [place] [address]   Surgical pcr screen     Status: None   Collection Time: 06/02/19  7:23 AM   Specimen: Nasal Mucosa; Nasal Swab  Result Value Ref Range Status   MRSA, PCR NEGATIVE NEGATIVE Final   Staphylococcus aureus NEGATIVE NEGATIVE Final    Comment: (NOTE) The Xpert SA Assay (FDA approved for NASAL specimens in patients 4 years of age and older), is one component of a comprehensive surveillance program. It is not intended to diagnose infection nor to guide or monitor treatment. Performed at [facility], [place] [address] [place], [place] [address]      Time coordinating discharge: 25 minutes     SIGNED:   Edwin Dada, MD  [facility] 06/02/2019, 5:27 PM

## 2019-06-02 NOTE — Care Management Obs Status (Signed)
[facility] NOTIFICATION   Patient Details  Name: William [name] MRN: [id number] Date of Birth: 1951-03-04   Medicare Observation Status Notification Given:  Yes    Bartholomew Crews, RN 06/02/2019, 5:36 PM

## 2019-06-02 NOTE — Procedures (Signed)
   I was present at this dialysis session, have reviewed the session itself and made  appropriate changes Kelly Splinter MD [place] pager [phone]   06/02/2019, 4:29 PM

## 2019-06-02 NOTE — Progress Notes (Signed)
MD called concerning blood work still not resulted.  Spoke with main lab regarding the issue and she stated she will paged them to draw BMP ordered at 2049.

## 2019-06-02 NOTE — Care Management CC44 (Signed)
Condition Code 44 Documentation Completed  Patient Details  Name: William [name] MRN: [id number] Date of Birth: 1951-05-14   Condition Code 44 given:  Yes Patient signature on Condition Code 44 notice:  Yes Documentation of 2 MD's agreement:  Yes Code 44 added to claim:  Yes    Bartholomew Crews, RN 06/02/2019, 5:36 PM

## 2019-06-02 NOTE — Progress Notes (Signed)
Inpatient Diabetes Program Recommendations  AACE/ADA: New Consensus Statement on Inpatient Glycemic Control (2015)  Target Ranges:  Prepandial:   less than 140 mg/dL      [place]ak postprandial:   less than 180 mg/dL (1-2 hours)      Critically ill patients:  140 - 180 mg/dL   Lab Results  Component Value Date   GLUCAP 52 (L) 06/02/2019   HGBA1C 6.8 (A) 03/17/2019    Review of Glycemic Control  Diabetes history: DM2 Outpatient Diabetes medications: Tresiba 12 units QD, Novolin R 8-10-8 units tidwc Current orders for Inpatient glycemic control: Lantus 10 units QD, Novolog 0-9 units tidwc and 0-5 units QHS + 3 units tidwc  HgbA1C - 6.8% Hypo of 52 before lunch. No Novolog given today.  Inpatient Diabetes Program Recommendations:     Decrease Lantus to 8 units QD.  Will follow closely.  Thank you. Lorenda Peck, RD, LDN, CDE Inpatient Diabetes Coordinator [phone]

## 2019-06-02 NOTE — Progress Notes (Signed)
Patient discharged to home, AVS reviewed, dressing over AV fistula clean dry and intact. Iv removed, tele-box returned. Patient left floor via wheelchair with staff member, son provided transportation.

## 2019-06-02 NOTE — Consult Note (Addendum)
[facility] Renal Consultation Note    Indication for Consultation:  Management of ESRD/hemodialysis; anemia, hypertension/volume and secondary hyperparathyroidism  PXT:GGYIRSWN, Ines Bloomer, MD  HPI: William [name] is a 68 y.o. male. ESRD 2/2 DM on HD TTS at [facility], first starting on 01/19/18.  Past medical history significant for DM, diastolic HF grade 1, H/O [place] disease followed by oncology/pulmonary, non-alcoholic cirrhosis, AOCD, pancytopenia.   Patient presented to the hospital due to clotted dialysis access.  History obtained from son as interpretor and through chart review.  When he presented to outpatient dialysis center on Tuesday it was determined that his dialysis access was clotted. Last dialysis completed on Saturday 05/29/2019.  Patient states he feels fine, just in need of dialysis.  Denies SOB, CP, n/v/d, abdominal pain.   Patient underwent successful L AVF declot and PCA of 2 areas of stenosis today by [facility] IR.    Past Medical History:  Diagnosis Date  . Anemia   . Blind one eye   . CHF (congestive heart failure) ([place])   . Cirrhosis ([place])   . Cirrhosis ([place])   . CKD (chronic kidney disease), stage IV ([place])   . Dialysis patient ([place])   . [place]'s disease [facility])    on outpatient plasmapheresis/notes 11/20/2017  . Grade I diastolic dysfunction 46/27/0350  . History of anemia due to chronic kidney disease   . History of blood transfusion X 1   "UGIB; low blood count"  . History of plasmapheresis    "qod" (11/20/2017)  . Hypertension   . Pancytopenia ([place]) 08/20/2017  . Type II diabetes mellitus ([place])    Past Surgical History:  Procedure Laterality Date  . AV FISTULA PLACEMENT Left 12/26/2017   Procedure: LEFT BRACHIOCEPHALIC ARTERIOVENOUS (AV) FISTULA CREATION;  Surgeon: Conrad Keachi, MD;  Location: [facility];  Service: Vascular;  Laterality: Left;  . BASCILIC VEIN TRANSPOSITION Left 02/16/2018   Procedure: BRACHIOBASILIC VEIN TRANSPOSITION  SECOND STAGE;  Surgeon: Conrad [place] Cass[place], MD;  Location: [facility];  Service: Vascular;  Laterality: Left;  . CATARACT EXTRACTION W/ INTRAOCULAR LENS  IMPLANT, BILATERAL Bilateral   . ESOPHAGEAL BANDING N/A 04/01/2018   Procedure: ESOPHAGEAL BANDING;  Surgeon: Otis Brace, MD;  [facility]: [facility];  Service: Gastroenterology;  Laterality: N/A;  . ESOPHAGEAL BANDING N/A 06/29/2018   Procedure: ESOPHAGEAL BANDING;  Surgeon: Otis Brace, MD;  [facility]: [facility];  Service: Gastroenterology;  Laterality: N/A;  . ESOPHAGOGASTRODUODENOSCOPY (EGD) WITH PROPOFOL N/A 04/01/2018   Procedure: ESOPHAGOGASTRODUODENOSCOPY (EGD) WITH PROPOFOL;  Surgeon: Otis Brace, MD;  [facility];  Service: Gastroenterology;  Laterality: N/A;  . ESOPHAGOGASTRODUODENOSCOPY (EGD) WITH PROPOFOL N/A 06/29/2018   Procedure: ESOPHAGOGASTRODUODENOSCOPY (EGD) WITH PROPOFOL;  Surgeon: Otis Brace, MD;  [facility];  Service: Gastroenterology;  Laterality: N/A;  . ESOPHAGOGASTRODUODENOSCOPY (EGD) WITH PROPOFOL N/A 09/30/2018   Procedure: ESOPHAGOGASTRODUODENOSCOPY (EGD) WITH PROPOFOL;  Surgeon: Otis Brace, MD;  [facility];  Service: Gastroenterology;  Laterality: N/A;  . ESOPHAGOGASTRODUODENOSCOPY (EGD) WITH PROPOFOL N/A 04/20/2019   Procedure: ESOPHAGOGASTRODUODENOSCOPY (EGD) WITH PROPOFOL;  Surgeon: Otis Brace, MD;  [facility];  Service: Gastroenterology;  Laterality: N/A;  . I&D EXTREMITY Left 02/18/2018   Procedure: IRRIGATION AND DEBRIDEMENT LEFT ARM;  Surgeon: Serafina Mitchell, MD;  Location: [facility];  Service: Vascular;  Laterality: Left;  . INSERTION OF DIALYSIS CATHETER N/A 01/19/2018   Procedure: INSERTION OF DIALYSIS CATHETER - RIGHT INTERNAL JUGULAR PLACEMENT;  Surgeon: Rosetta Posner, MD;  Location: [facility];  Service: Vascular;  Laterality: N/A;  . IR FLUORO GUIDE CV LINE RIGHT  11/10/2017  . IR PARACENTESIS  12/10/2017  . IR PARACENTESIS  12/26/2017  .  IR REMOVAL TUN CV CATH W/O FL  11/28/2017  . IR US GUIDE VASC ACCESS RIGHT  11/10/2017  . NECK SURGERY  2007   "back of neck; no hardware in there"   Family History  Problem Relation Age of Onset  . Diabetes Mellitus II Sister   . Diabetes Sister   . Stroke Brother   . Heart attack Brother    Social History:  reports that he has never smoked. He has never used smokeless tobacco. He reports that he does not drink alcohol or use drugs. Allergies  Allergen Reactions  . Heparin Other (See Comments)    Patient is Muslim and is not permitted Pork derative due to religious belief)  . Pork-Derived Products Other (See Comments)    Patient does not eat pork due to religious beliefs   Prior to Admission medications   Medication Sig Start Date End Date Taking? Authorizing Provider  acetaminophen (TYLENOL) 650 MG CR tablet Take 650 mg by mouth every 8 (eight) hours as needed for pain.   Yes [provider]  amLODipine (NORVASC) 10 MG tablet Take 1 tablet (10 mg total) by mouth daily. Patient taking differently: Take 5 mg by mouth daily.  04/21/18 09/25/19 Yes Sagardia, Ines Bloomer, MD  calcium acetate (PHOSLO) 667 MG capsule Take 2 capsules (1,334 mg total) by mouth 3 (three) times daily with meals. Patient taking differently: Take 667 mg by mouth 3 (three) times daily with meals.  01/26/18  Yes Purohit, Konrad Dolores, MD  carvedilol (COREG) 3.125 MG tablet Take 1 tablet (3.125 mg total) by mouth 2 (two) times daily. 05/03/19 08/01/19 Yes Sagardia, Ines Bloomer, MD  ferrous sulfate 325 (65 FE) MG tablet Take 325 mg by mouth daily with breakfast.   Yes [provider]  insulin degludec (TRESIBA FLEXTOUCH) 100 UNIT/ML SOPN FlexTouch Pen Inject 0.14 mLs (14 Units total) into the skin daily. Inject 14 units under the skin once daily. Patient taking differently: Inject 12 Units into the skin daily.  03/02/19  Yes Elayne Snare, MD  insulin regular (NOVOLIN R,HUMULIN R) 100 units/mL injection Inject  6-10 Units into the skin See admin instructions. INJECT 8 UNITS UNDER THE SKIN AT BREAKFAST, 10 UNITS AT LUNCH AND 8 UNITS AT DINNER.   Yes [provider]  Multiple Vitamins-Minerals (MULTIVITAMIN WITH MINERALS) tablet Take 1 tablet by mouth daily.   Yes [provider]  Polyethyl Glycol-Propyl Glycol (SYSTANE) 0.4-0.3 % SOLN Place 1 drop into both eyes 3 (three) times daily as needed (for dry eyes).   Yes [provider]  rosuvastatin (CRESTOR) 5 MG tablet Take 1 tablet (5 mg total) by mouth daily. 03/17/19  Yes Elayne Snare, MD  glucose blood (ONETOUCH VERIO) test strip USE TO CHECK BLOOD SUGAR THREE TIMES DAILY. DX:E11.65 04/21/19   Elayne Snare, MD  Insulin Pen Needle (ADVOCATE INSULIN PEN NEEDLES) 31G X 5 MM MISC Use four times daily to inject insulin 04/27/18   Elayne Snare, MD  lidocaine-prilocaine (EMLA) cream Apply 1 application topically Every Tuesday,Thursday,and Saturday with dialysis. 08/24/18   [provider]  ONE TOUCH LANCETS MISC Use to test blood sugar three times daily 04/27/18   Elayne Snare, MD   Current Facility-Administered Medications  Medication Dose Route Frequency Provider Last Rate Last Dose  . 0.9 %  sodium chloride infusion   Intravenous  Continuous PRN Markus Daft, MD 10 mL/hr at 06/02/19 0931 10 mL/hr at 06/02/19 0931  . alteplase (CATHFLO ACTIVASE) 2 MG injection           . alteplase (CATHFLO ACTIVASE) injection   Intracatheter PRN Markus Daft, MD   2 mg at 06/02/19 1057  . amLODipine (NORVASC) tablet 5 mg  5 mg Oral Daily Tu, Ching T, DO      . calcium acetate (PHOSLO) capsule 667 mg  667 mg Oral TID WC Tu, Ching T, DO      . carvedilol (COREG) tablet 3.125 mg  3.125 mg Oral BID AC Tu, Ching T, DO      . Chlorhexidine Gluconate Cloth 2 % PADS 6 each  6 each Topical Q0600 Penninger, Lindsay, PA      . fentaNYL (SUBLIMAZE) 100 MCG/2ML injection           . fentaNYL (SUBLIMAZE) injection   Intravenous PRN Markus Daft, MD   25 mcg at 06/02/19  1205  . ferrous sulfate tablet 325 mg  325 mg Oral Q breakfast Tu, Ching T, DO      . insulin aspart (novoLOG) injection 0-5 Units  0-5 Units Subcutaneous QHS Tu, Ching T, DO   2 Units at 06/01/19 2350  . insulin aspart (novoLOG) injection 0-9 Units  0-9 Units Subcutaneous TID WC Tu, Ching T, DO      . insulin aspart (novoLOG) injection 3 Units  3 Units Subcutaneous TID WC Tu, Ching T, DO      . insulin glargine (LANTUS) injection 10 Units  10 Units Subcutaneous Daily Tu, Ching T, DO   10 Units at 06/01/19 2351  . lidocaine (XYLOCAINE) 1 % (with pres) injection           . lidocaine (XYLOCAINE) 1 % (with pres) injection   Infiltration PRN Markus Daft, MD   10 mL at 06/02/19 1052  . midazolam (VERSED) 2 MG/2ML injection           . midazolam (VERSED) injection   Intravenous PRN Markus Daft, MD   0.5 mg at 06/02/19 1150  . rosuvastatin (CRESTOR) tablet 5 mg  5 mg Oral Daily Orene Desanctis, DO       Labs: Basic Metabolic Panel: Recent Labs  Lab 06/01/19 1600 06/01/19 2254 06/02/19 0457  NA 131* 136 136  K 6.3* 5.0 4.0  CL 100 103 101  CO2 18* 18* 20*  GLUCOSE 339* 175* 136*  BUN 115* 112* 111*  CREATININE 9.01* 9.12* 9.24*  CALCIUM 8.4* 8.6* 8.2*   Liver Function Tests: Recent Labs  Lab 06/01/19 1600  AST 24  ALT 19  ALKPHOS 123  BILITOT 0.8  PROT 6.9  ALBUMIN 3.6   CBC: Recent Labs  Lab 06/01/19 1600 06/02/19 0457  WBC 3.2* 2.8*  NEUTROABS 1.9  --   HGB 10.7* 9.6*  HCT 34.3* 30.6*  MCV 83.3 82.7  PLT 44* 45*   CBG: Recent Labs  Lab 06/01/19 1823 06/01/19 2046 06/01/19 2344 06/02/19 0411 06/02/19 0716  GLUCAP 254* 178* 241* 160* 72   Studies/Results: Dg Chest Portable 1 View  Result Date: 06/01/2019 CLINICAL DATA:  Volume overload.  End-stage renal disease EXAM: PORTABLE CHEST 1 VIEW COMPARISON:  January 23, 2018 FINDINGS: No edema or consolidation. Heart is mildly enlarged with pulmonary vascularity within normal limits. No adenopathy. There is aortic  atherosclerosis. No appreciable bone lesions. IMPRESSION: Mild cardiomegaly. No edema or consolidation. Aortic Atherosclerosis (ICD10-I70.0). Electronically Signed   By:  Lowella Grip III M.D.   On: 06/01/2019 17:23    ROS: All others negative except those listed in HPI.   Physical Exam: Vitals:   06/02/19 1145 06/02/19 1150 06/02/19 1155 06/02/19 1200  BP: (!) 161/80 (!) 160/80 (!) 145/70 140/76  Pulse: 77 78 76 75  Resp: 13 13 15 11   Temp:      TempSrc:      SpO2: 99% 97% 98% 99%  Weight:         General: WDWN NAD, well appearing male Head: NCAT sclera not icteric MMM Neck: Supple. No lymphadenopathy Lungs: CTA bilaterally. No wheeze, rales or rhonchi. Breathing is unlabored. Heart: RRR. No murmur, rubs or gallops.  Abdomen: soft, nontender, +BS, no guarding, no rebound tenderness Lower extremities:no edema, ischemic changes, or open wounds  Neuro: AAOx3. Moves all extremities spontaneously. Psych:  Responds to questions appropriately with a normal affect. Dialysis Access: LU AVF accessed  Dialysis Orders:  TTS - AF  4hrs, BFR 400, DFR 800,  EDW 67kg, 2K/ 3.5Ca  Access: LU AVF  Heparin None  Assessment/Plan: 1.  Thrombosed LU AVF - s/p successful declot and TPA 2 stenosed areas by IR on 10/28. Successfully cannulated today and currently in use.  2.  ESRD -  On HD TTS.  Make up HD today.  Resume regular schedule tomorrow as outpatient. K 4.0. 3.  Hypertension/volume  - BP currently well controlled.  Does not appear volume overloaded.  4.  Anemia of CKD - Hgb 9.6. ESA recently dosed.  5.  Secondary Hyperparathyroidism -  Ca at goal. Phos in goal as OP. Continue binders.  6.    Nutrition - Renal diet w/fluid restrictions.  7. Dispo - ok to d/c from renal standpoint, Dialysis access successfully declotted and working well during dialysis today.    Jen Mow, PA-C [facility] Pager: [phone] 06/02/2019, 12:11 PM   Pt seen, examined and  agree w A/P as above.  Kelly Splinter  MD 06/02/2019, 4:28 PM

## 2019-06-02 NOTE — Progress Notes (Signed)
DISCHARGE NOTE HOME Dam Kozma to be discharged Home per MD order. Discussed prescriptions and follow up appointments with the patient. Prescriptions given to patient; medication list explained in detail. Patient verbalized understanding.  Skin clean, dry and intact without evidence of skin break down, no evidence of skin tears noted. IV catheter discontinued intact. Site without signs and symptoms of complications. Dressing and pressure applied. Pt denies pain at the site currently. No complaints noted.  Patient free of lines, drains, and wounds.   An After Visit Summary (AVS) was printed and given to the patient. Patient escorted via wheelchair, and discharged home via private auto.  Aneta Mins BSN, RN3

## 2019-06-02 NOTE — Consult Note (Signed)
Chief Complaint: Patient was seen in consultation today for left arm dialysis fistula evaluation/thrombolysis Chief Complaint  Patient presents with  . Vascular Access Problem   at the request of Dr  Jodelle Gross   Supervising Physician: Markus Daft  Patient Status: [facility] - In-pt  History of Present Illness: William [name] is a 69 y.o. male   ESRD Goodpastures syndrome; NAFLD cirrhosis CHF; HTN; AODM To dialysis Ctr yesterday-- clotted fistula Last successful dialysis 10/25  Sent to ED for evaluation  ED MD discussed with Nephrology per chart Request for Declot     Past Medical History:  Diagnosis Date  . Anemia   . Blind one eye   . CHF (congestive heart failure) ([place])   . Cirrhosis ([place])   . Cirrhosis ([place])   . CKD (chronic kidney disease), stage IV ([place])   . Dialysis patient ([place])   . Goodpasture's disease [facility])    on outpatient plasmapheresis/notes 11/20/2017  . Grade I diastolic dysfunction 58/52/7782  . History of anemia due to chronic kidney disease   . History of blood transfusion X 1   "UGIB; low blood count"  . History of plasmapheresis    "qod" (11/20/2017)  . Hypertension   . Pancytopenia ([place]) 08/20/2017  . Type II diabetes mellitus ([place])     Past Surgical History:  Procedure Laterality Date  . AV FISTULA PLACEMENT Left 12/26/2017   Procedure: LEFT BRACHIOCEPHALIC ARTERIOVENOUS (AV) FISTULA CREATION;  Surgeon: Conrad Grafton, MD;  Location: [place];  Service: Vascular;  Laterality: Left;  . BASCILIC VEIN TRANSPOSITION Left 02/16/2018   Procedure: BRACHIOBASILIC VEIN TRANSPOSITION SECOND STAGE;  Surgeon: Conrad Reinerton, MD;  Location: [place];  Service: Vascular;  Laterality: Left;  . CATARACT EXTRACTION W/ INTRAOCULAR LENS  IMPLANT, BILATERAL Bilateral   . ESOPHAGEAL BANDING N/A 04/01/2018   Procedure: ESOPHAGEAL BANDING;  Surgeon: Otis Brace, MD;  [facility];  Service: Gastroenterology;  Laterality: N/A;  . ESOPHAGEAL BANDING N/A 06/29/2018    Procedure: ESOPHAGEAL BANDING;  Surgeon: Otis Brace, MD;  [facility];  Service: Gastroenterology;  Laterality: N/A;  . ESOPHAGOGASTRODUODENOSCOPY (EGD) WITH PROPOFOL N/A 04/01/2018   Procedure: ESOPHAGOGASTRODUODENOSCOPY (EGD) WITH PROPOFOL;  Surgeon: Otis Brace, MD;  [facility];  Service: Gastroenterology;  Laterality: N/A;  . ESOPHAGOGASTRODUODENOSCOPY (EGD) WITH PROPOFOL N/A 06/29/2018   Procedure: ESOPHAGOGASTRODUODENOSCOPY (EGD) WITH PROPOFOL;  Surgeon: Otis Brace, MD;  [facility];  Service: Gastroenterology;  Laterality: N/A;  . ESOPHAGOGASTRODUODENOSCOPY (EGD) WITH PROPOFOL N/A 09/30/2018   Procedure: ESOPHAGOGASTRODUODENOSCOPY (EGD) WITH PROPOFOL;  Surgeon: Otis Brace, MD;  [facility];  Service: Gastroenterology;  Laterality: N/A;  . ESOPHAGOGASTRODUODENOSCOPY (EGD) WITH PROPOFOL N/A 04/20/2019   Procedure: ESOPHAGOGASTRODUODENOSCOPY (EGD) WITH PROPOFOL;  Surgeon: Otis Brace, MD;  [facility];  Service: Gastroenterology;  Laterality: N/A;  . I&D EXTREMITY Left 02/18/2018   Procedure: IRRIGATION AND DEBRIDEMENT LEFT ARM;  Surgeon: Serafina Mitchell, MD;  Location: [place];  Service: Vascular;  Laterality: Left;  . INSERTION OF DIALYSIS CATHETER N/A 01/19/2018   Procedure: INSERTION OF DIALYSIS CATHETER - RIGHT INTERNAL JUGULAR PLACEMENT;  Surgeon: Rosetta Posner, MD;  Location: [place];  Service: Vascular;  Laterality: N/A;  . IR FLUORO GUIDE CV LINE RIGHT  11/10/2017  . IR PARACENTESIS  12/10/2017  . IR PARACENTESIS  12/26/2017  . IR REMOVAL TUN CV CATH W/O FL  11/28/2017  . IR US GUIDE VASC ACCESS RIGHT  11/10/2017  . NECK SURGERY  2007   "back of neck;  no hardware in there"    Allergies: Heparin and Pork-derived products  Medications: Prior to Admission medications   Medication Sig Start Date End Date Taking? Authorizing Provider  acetaminophen (TYLENOL) 650 MG CR tablet Take 650 mg by mouth every 8  (eight) hours as needed for pain.    [provider]  amLODipine (NORVASC) 10 MG tablet Take 1 tablet (10 mg total) by mouth daily. Patient taking differently: Take 5 mg by mouth daily.  04/21/18 09/25/19  Horald Pollen, MD  calcium acetate (PHOSLO) 667 MG capsule Take 2 capsules (1,334 mg total) by mouth 3 (three) times daily with meals. Patient taking differently: Take 667 mg by mouth 3 (three) times daily with meals.  01/26/18   Purohit, Konrad Dolores, MD  carvedilol (COREG) 3.125 MG tablet Take 1 tablet (3.125 mg total) by mouth 2 (two) times daily. 05/03/19 08/01/19  Horald Pollen, MD  ferrous sulfate 325 (65 FE) MG tablet Take 325 mg by mouth daily with breakfast.    [provider]  glucose blood (ONETOUCH VERIO) test strip USE TO CHECK BLOOD SUGAR THREE TIMES DAILY. DX:E11.65 04/21/19   Elayne Snare, MD  insulin degludec (TRESIBA FLEXTOUCH) 100 UNIT/ML SOPN FlexTouch Pen Inject 0.14 mLs (14 Units total) into the skin daily. Inject 14 units under the skin once daily. Patient taking differently: Inject 12 Units into the skin daily.  03/02/19   Elayne Snare, MD  Insulin Pen Needle (ADVOCATE INSULIN PEN NEEDLES) 31G X 5 MM MISC Use four times daily to inject insulin 04/27/18   Elayne Snare, MD  insulin regular (NOVOLIN R,HUMULIN R) 100 units/mL injection Inject 6-10 Units into the skin See admin instructions. INJECT 8 UNITS UNDER THE SKIN AT BREAKFAST, 10 UNITS AT LUNCH AND 8 UNITS AT DINNER.    [provider]  lidocaine-prilocaine (EMLA) cream Apply 1 application topically Every Tuesday,Thursday,and Saturday with dialysis. 08/24/18   [provider]  Multiple Vitamins-Minerals (MULTIVITAMIN WITH MINERALS) tablet Take 1 tablet by mouth daily.    [provider]  mupirocin ointment (BACTROBAN) 2 % Apply 1 application topically 2 (two) times daily. Patient not taking: Reported on 04/15/2019 12/14/18   Trula Slade, DPM  ONE TOUCH LANCETS MISC Use to  test blood sugar three times daily 04/27/18   Elayne Snare, MD  Polyethyl Glycol-Propyl Glycol (SYSTANE) 0.4-0.3 % SOLN Place 1 drop into both eyes 3 (three) times daily as needed (for dry eyes).    [provider]  rosuvastatin (CRESTOR) 5 MG tablet Take 1 tablet (5 mg total) by mouth daily. 03/17/19   Elayne Snare, MD     Family History  Problem Relation Age of Onset  . Diabetes Mellitus II Sister   . Diabetes Sister   . Stroke Brother   . Heart attack Brother     Social History   Socioeconomic History  . Marital status: Married    Spouse name: Not on file  . Number of children: Not on file  . Years of education: Not on file  . Highest education level: Not on file  Occupational History  . Not on file  Social Needs  . Financial resource strain: Not on file  . Food insecurity    Worry: Not on file    Inability: Not on file  . Transportation needs    Medical: Not on file    Non-medical: Not on file  Tobacco Use  . Smoking status: Never Smoker  . Smokeless tobacco: Never Used  Substance  and Sexual Activity  . Alcohol use: Never    Frequency: Never  . Drug use: Never  . Sexual activity: Not on file  Lifestyle  . Physical activity    Days per week: Not on file    Minutes per session: Not on file  . Stress: Not on file  Relationships  . Social Herbalist on phone: Not on file    Gets together: Not on file    Attends religious service: Not on file    Active member of club or organization: Not on file    Attends meetings of clubs or organizations: Not on file    Relationship status: Not on file  Other Topics Concern  . Not on file  Social History Narrative  . Not on file    Review of Systems: A 12 point ROS discussed and pertinent positives are indicated in the HPI above.  All other systems are negative.  Review of Systems  Constitutional: Positive for fatigue.  Respiratory: Negative for cough and shortness of breath.   Cardiovascular: Negative  for chest pain.  Gastrointestinal: Negative for abdominal pain.  Psychiatric/Behavioral: Negative for behavioral problems and confusion.    Vital Signs: BP 140/63 (BP Location: Right Arm)   Pulse 80   Temp 99.1 F (37.3 C) (Oral)   Resp 14   Wt 153 lb (69.4 kg)   SpO2 100%   BMI 25.46 kg/m   Physical Exam Cardiovascular:     Rate and Rhythm: Normal rate and regular rhythm.     Heart sounds: Normal heart sounds.  Pulmonary:     Breath sounds: Normal breath sounds.  Abdominal:     Palpations: Abdomen is soft.  Musculoskeletal: Normal range of motion.     Comments: Left arm dialysis fistula  No thrill; no pulse  Skin:    General: Skin is warm and dry.  Neurological:     Mental Status: He is alert and oriented to person, place, and time.  Psychiatric:        Behavior: Behavior normal.     Comments: Spoke to Son via phone this am Consented for procedure     Imaging: Ct Virtual Colonoscopy Screening  Result Date: 05/24/2019 CLINICAL DATA:  Colon cancer screening EXAM: CT VIRTUAL COLONOSCOPY SCREENING TECHNIQUE: The patient was given a standard Mag citrate bowel preparation with Gastrografin and barium for fluid and stool tagging respectively. The quality of the bowel preparation is moderate. Automated CO2 insufflation of the colon was performed prior to image acquisition and colonic distention is poor. Image post processing was used to generate a 3D endoluminal fly-through projection of the colon and to electronically subtract stool/fluid as appropriate. COMPARISON:  CT abdomen/pelvis dated 12/19/2017 FINDINGS: VIRTUAL COLONOSCOPY Moderate layering fluid/contrast in the colon, with shifts positions on supine/prone imaging, and only mildly constraints evaluation. However, when coupled with narrowing of the sigmoid colon along with fluid, this limits 3D evaluation of segments colon. The regions can still be evaluated on 2D imaging. No significant colonic polyp, mass, apple core  lesion, or stricture. Long segment wall thickening involving the sigmoid colon, likely aspirated by underdistention but still felt to be a true finding. This was not present on the prior and is considered suspicious for infectious/inflammatory colitis. No evidence of bowel obstruction. Normal appendix (series 4/image 89). Virtual colonoscopy is not designed to detect diminutive polyps (i.e., less than or equal to 5 mm), the presence or absence of which may not affect clinical management. CT ABDOMEN  AND PELVIS WITHOUT CONTRAST Lower chest: Lung bases are clear. Hepatobiliary: Nodular hepatic contour. No focal hepatic lesion is seen. Gallbladder is unremarkable. No intrahepatic or extrahepatic ductal dilatation. Pancreas: Within normal limits. Spleen: Enlarged, measuring 15.0 cm in maximal craniocaudal dimension. Adrenals/Urinary Tract: Adrenal glands are within normal limits. Kidneys are within normal limits.  No hydronephrosis. Bladder is thick-walled although underdistended. Stomach/Bowel: Stomach is within normal limits, noting suspected perigastric varices along the posterior gastric cardia (series 4/image 36). Visualized bowel is described above. Vascular/Lymphatic: No evidence of abdominal aortic aneurysm. Atherosclerotic calcifications of the abdominal aorta and branch vessels. Perigastric varices, as described above. No suspicious abdominopelvic lymphadenopathy. Reproductive: Prostatomegaly. Other: Prior abdominopelvic ascites has resolved. Musculoskeletal: Mild degenerative changes at L5-S1. IMPRESSION: Limited evaluation, as described above. Within that constraint, there is no significant colonic polyp, mass, apple core lesion, or stricture. Long segment wall thickening involving the sigmoid colon, exacerbated by underdistention, but favored to reflect infectious/inflammatory colitis. This is new from 2019. Cirrhosis. Splenomegaly. Suspected perigastric varices along the posterior gastric cardia. Prior  abdominopelvic ascites has resolved. Electronically Signed   By: Julian Hy M.D.   On: 05/24/2019 10:40   Dg Chest Portable 1 View  Result Date: 06/01/2019 CLINICAL DATA:  Volume overload.  End-stage renal disease EXAM: PORTABLE CHEST 1 VIEW COMPARISON:  January 23, 2018 FINDINGS: No edema or consolidation. Heart is mildly enlarged with pulmonary vascularity within normal limits. No adenopathy. There is aortic atherosclerosis. No appreciable bone lesions. IMPRESSION: Mild cardiomegaly. No edema or consolidation. Aortic Atherosclerosis (ICD10-I70.0). Electronically Signed   By: Lowella Grip III M.D.   On: 06/01/2019 17:23    Labs:  CBC: Recent Labs    06/29/18 0747 09/30/18 1158 04/20/19 0752 06/01/19 1600  WBC  --   --   --  3.2*  HGB 12.9* 12.2* 10.9* 10.7*  HCT 38.0* 36.0* 32.0* 34.3*  PLT  --   --   --  44*    COAGS: No results for input(s): INR, APTT in the last 8760 hours.  BMP: Recent Labs    04/20/19 0752 06/01/19 1600 06/01/19 2254 06/02/19 0457  NA 131* 131* 136 136  K 4.9 6.3* 5.0 4.0  CL  --  100 103 101  CO2  --  18* 18* 20*  GLUCOSE 136* 339* 175* 136*  BUN  --  115* 112* 111*  CALCIUM  --  8.4* 8.6* 8.2*  CREATININE  --  9.01* 9.12* 9.24*  GFRNONAA  --  5* 5* 5*  GFRAA  --  6* 6* 6*    LIVER FUNCTION TESTS: Recent Labs    06/01/19 1600  BILITOT 0.8  AST 24  ALT 19  ALKPHOS 123  PROT 6.9  ALBUMIN 3.6    TUMOR MARKERS: No results for input(s): AFPTM, CEA, CA199, CHROMGRNA in the last 8760 hours.  Assessment and Plan:  Clotted left arm dialysis fistula Last dialysis 10/25 Scheduled for evaluation/thrombolysis- possible tunneled dialysis catheter if needed Risks and benefits discussed with the patient's son via phone including, but not limited to bleeding, infection, vascular injury, pulmonary embolism, need for tunneled HD catheter placement or even death.  All of his questions were answered, son is agreeable to proceed. Consent  signed and in chart.   Thank you for this interesting consult.  I greatly enjoyed meeting William [name] and look forward to participating in their care.  A copy of this report was sent to the requesting provider on this date.  Electronically Signed: Lavonia Drafts,  PA-C 06/02/2019, 7:22 AM   I spent a total of 20 Minutes    in face to face in clinical consultation, greater than 50% of which was counseling/coordinating care for left arm dialysis fistula declot

## 2019-06-02 NOTE — Progress Notes (Signed)
Patient can have dialysis today at OP HD clinic/SW as long as he can arrive by 1:00pm (will have remainder of regular treatment). He can have a full treatment if he arrives by 12-12:30pm. Renal Navigator spoke with IR staff prior to his procedure today and asked to be contacted when he was finished so Navigator can keep clinic informed on whether or not he will make it to clinic today. Navigator has already talked to patient's son, who states he can take patient straight to OP HD clinic from hospital for HD if there is time. Renal Navigator updated Renal PA, RN staff and Attending.  Alphonzo Cruise, [place] [facility] Renal Navigator [phone]

## 2019-06-03 DIAGNOSIS — D631 Anemia in chronic kidney disease: Secondary | ICD-10-CM | POA: Diagnosis not present

## 2019-06-03 DIAGNOSIS — D509 Iron deficiency anemia, unspecified: Secondary | ICD-10-CM | POA: Diagnosis not present

## 2019-06-03 DIAGNOSIS — N2581 Secondary hyperparathyroidism of renal origin: Secondary | ICD-10-CM | POA: Diagnosis not present

## 2019-06-03 DIAGNOSIS — Z992 Dependence on renal dialysis: Secondary | ICD-10-CM | POA: Diagnosis not present

## 2019-06-03 DIAGNOSIS — N186 End stage renal disease: Secondary | ICD-10-CM | POA: Diagnosis not present

## 2019-06-03 DIAGNOSIS — Z23 Encounter for immunization: Secondary | ICD-10-CM | POA: Diagnosis not present

## 2019-06-04 ENCOUNTER — Telehealth: Payer: Self-pay | Admitting: Nephrology

## 2019-06-04 NOTE — Telephone Encounter (Signed)
Transitional Care Management Note  Date of discharge: 06/02/2019            Date of contact: 06/04/2019 Method of contact: Phone  Attempted to contact patients to discuss transition of care from inpatient admission. Patient did not answer the phone. Unable to leave message on patient phone due to his mail box being full.  Will attempt to call again tomorrow.   Jen Mow, PA-C [facility] Pager: [phone]

## 2019-06-05 DIAGNOSIS — N186 End stage renal disease: Secondary | ICD-10-CM | POA: Diagnosis not present

## 2019-06-05 DIAGNOSIS — N2581 Secondary hyperparathyroidism of renal origin: Secondary | ICD-10-CM | POA: Diagnosis not present

## 2019-06-05 DIAGNOSIS — D631 Anemia in chronic kidney disease: Secondary | ICD-10-CM | POA: Diagnosis not present

## 2019-06-05 DIAGNOSIS — Z23 Encounter for immunization: Secondary | ICD-10-CM | POA: Diagnosis not present

## 2019-06-05 DIAGNOSIS — Z992 Dependence on renal dialysis: Secondary | ICD-10-CM | POA: Diagnosis not present

## 2019-06-05 DIAGNOSIS — D509 Iron deficiency anemia, unspecified: Secondary | ICD-10-CM | POA: Diagnosis not present

## 2019-06-06 DIAGNOSIS — E1122 Type 2 diabetes mellitus with diabetic chronic kidney disease: Secondary | ICD-10-CM | POA: Diagnosis not present

## 2019-06-06 DIAGNOSIS — N186 End stage renal disease: Secondary | ICD-10-CM | POA: Diagnosis not present

## 2019-06-06 DIAGNOSIS — Z992 Dependence on renal dialysis: Secondary | ICD-10-CM | POA: Diagnosis not present

## 2019-06-08 DIAGNOSIS — Z23 Encounter for immunization: Secondary | ICD-10-CM | POA: Diagnosis not present

## 2019-06-08 DIAGNOSIS — D631 Anemia in chronic kidney disease: Secondary | ICD-10-CM | POA: Diagnosis not present

## 2019-06-08 DIAGNOSIS — N186 End stage renal disease: Secondary | ICD-10-CM | POA: Diagnosis not present

## 2019-06-08 DIAGNOSIS — N2581 Secondary hyperparathyroidism of renal origin: Secondary | ICD-10-CM | POA: Diagnosis not present

## 2019-06-08 DIAGNOSIS — Z992 Dependence on renal dialysis: Secondary | ICD-10-CM | POA: Diagnosis not present

## 2019-06-08 DIAGNOSIS — D509 Iron deficiency anemia, unspecified: Secondary | ICD-10-CM | POA: Diagnosis not present

## 2019-06-09 ENCOUNTER — Other Ambulatory Visit (HOSPITAL_COMMUNITY): Payer: Self-pay | Admitting: Gastroenterology

## 2019-06-09 ENCOUNTER — Other Ambulatory Visit: Payer: Self-pay

## 2019-06-09 ENCOUNTER — Ambulatory Visit (HOSPITAL_COMMUNITY)
Admission: RE | Admit: 2019-06-09 | Discharge: 2019-06-09 | Disposition: A | Discharge status: Home / Self Care | Payer: Medicare Other | Source: Ambulatory Visit | Attending: Gastroenterology | Admitting: Gastroenterology

## 2019-06-09 DIAGNOSIS — Z01818 Encounter for other preprocedural examination: Secondary | ICD-10-CM | POA: Diagnosis not present

## 2019-06-09 DIAGNOSIS — K056 Periodontal disease, unspecified: Secondary | ICD-10-CM | POA: Diagnosis not present

## 2019-06-10 DIAGNOSIS — N2581 Secondary hyperparathyroidism of renal origin: Secondary | ICD-10-CM | POA: Diagnosis not present

## 2019-06-10 DIAGNOSIS — Z23 Encounter for immunization: Secondary | ICD-10-CM | POA: Diagnosis not present

## 2019-06-10 DIAGNOSIS — Z992 Dependence on renal dialysis: Secondary | ICD-10-CM | POA: Diagnosis not present

## 2019-06-10 DIAGNOSIS — D631 Anemia in chronic kidney disease: Secondary | ICD-10-CM | POA: Diagnosis not present

## 2019-06-10 DIAGNOSIS — N186 End stage renal disease: Secondary | ICD-10-CM | POA: Diagnosis not present

## 2019-06-10 DIAGNOSIS — D509 Iron deficiency anemia, unspecified: Secondary | ICD-10-CM | POA: Diagnosis not present

## 2019-06-11 ENCOUNTER — Other Ambulatory Visit: Payer: Medicare Other

## 2019-06-12 DIAGNOSIS — D631 Anemia in chronic kidney disease: Secondary | ICD-10-CM | POA: Diagnosis not present

## 2019-06-12 DIAGNOSIS — N186 End stage renal disease: Secondary | ICD-10-CM | POA: Diagnosis not present

## 2019-06-12 DIAGNOSIS — Z23 Encounter for immunization: Secondary | ICD-10-CM | POA: Diagnosis not present

## 2019-06-12 DIAGNOSIS — Z992 Dependence on renal dialysis: Secondary | ICD-10-CM | POA: Diagnosis not present

## 2019-06-12 DIAGNOSIS — D509 Iron deficiency anemia, unspecified: Secondary | ICD-10-CM | POA: Diagnosis not present

## 2019-06-12 DIAGNOSIS — N2581 Secondary hyperparathyroidism of renal origin: Secondary | ICD-10-CM | POA: Diagnosis not present

## 2019-06-14 ENCOUNTER — Other Ambulatory Visit: Payer: Self-pay

## 2019-06-15 DIAGNOSIS — N186 End stage renal disease: Secondary | ICD-10-CM | POA: Diagnosis not present

## 2019-06-15 DIAGNOSIS — Z23 Encounter for immunization: Secondary | ICD-10-CM | POA: Diagnosis not present

## 2019-06-15 DIAGNOSIS — D509 Iron deficiency anemia, unspecified: Secondary | ICD-10-CM | POA: Diagnosis not present

## 2019-06-15 DIAGNOSIS — Z992 Dependence on renal dialysis: Secondary | ICD-10-CM | POA: Diagnosis not present

## 2019-06-15 DIAGNOSIS — N2581 Secondary hyperparathyroidism of renal origin: Secondary | ICD-10-CM | POA: Diagnosis not present

## 2019-06-15 DIAGNOSIS — D631 Anemia in chronic kidney disease: Secondary | ICD-10-CM | POA: Diagnosis not present

## 2019-06-16 ENCOUNTER — Ambulatory Visit: Payer: Medicare Other | Admitting: Endocrinology

## 2019-06-17 DIAGNOSIS — N186 End stage renal disease: Secondary | ICD-10-CM | POA: Diagnosis not present

## 2019-06-17 DIAGNOSIS — D509 Iron deficiency anemia, unspecified: Secondary | ICD-10-CM | POA: Diagnosis not present

## 2019-06-17 DIAGNOSIS — Z23 Encounter for immunization: Secondary | ICD-10-CM | POA: Diagnosis not present

## 2019-06-17 DIAGNOSIS — Z992 Dependence on renal dialysis: Secondary | ICD-10-CM | POA: Diagnosis not present

## 2019-06-17 DIAGNOSIS — D631 Anemia in chronic kidney disease: Secondary | ICD-10-CM | POA: Diagnosis not present

## 2019-06-17 DIAGNOSIS — N2581 Secondary hyperparathyroidism of renal origin: Secondary | ICD-10-CM | POA: Diagnosis not present

## 2019-06-19 DIAGNOSIS — D509 Iron deficiency anemia, unspecified: Secondary | ICD-10-CM | POA: Diagnosis not present

## 2019-06-19 DIAGNOSIS — N2581 Secondary hyperparathyroidism of renal origin: Secondary | ICD-10-CM | POA: Diagnosis not present

## 2019-06-19 DIAGNOSIS — D631 Anemia in chronic kidney disease: Secondary | ICD-10-CM | POA: Diagnosis not present

## 2019-06-19 DIAGNOSIS — Z23 Encounter for immunization: Secondary | ICD-10-CM | POA: Diagnosis not present

## 2019-06-19 DIAGNOSIS — Z992 Dependence on renal dialysis: Secondary | ICD-10-CM | POA: Diagnosis not present

## 2019-06-19 DIAGNOSIS — N186 End stage renal disease: Secondary | ICD-10-CM | POA: Diagnosis not present

## 2019-06-22 DIAGNOSIS — D509 Iron deficiency anemia, unspecified: Secondary | ICD-10-CM | POA: Diagnosis not present

## 2019-06-22 DIAGNOSIS — D631 Anemia in chronic kidney disease: Secondary | ICD-10-CM | POA: Diagnosis not present

## 2019-06-22 DIAGNOSIS — N186 End stage renal disease: Secondary | ICD-10-CM | POA: Diagnosis not present

## 2019-06-22 DIAGNOSIS — Z23 Encounter for immunization: Secondary | ICD-10-CM | POA: Diagnosis not present

## 2019-06-22 DIAGNOSIS — Z992 Dependence on renal dialysis: Secondary | ICD-10-CM | POA: Diagnosis not present

## 2019-06-22 DIAGNOSIS — N2581 Secondary hyperparathyroidism of renal origin: Secondary | ICD-10-CM | POA: Diagnosis not present

## 2019-06-24 DIAGNOSIS — N2581 Secondary hyperparathyroidism of renal origin: Secondary | ICD-10-CM | POA: Diagnosis not present

## 2019-06-24 DIAGNOSIS — N186 End stage renal disease: Secondary | ICD-10-CM | POA: Diagnosis not present

## 2019-06-24 DIAGNOSIS — Z23 Encounter for immunization: Secondary | ICD-10-CM | POA: Diagnosis not present

## 2019-06-24 DIAGNOSIS — D509 Iron deficiency anemia, unspecified: Secondary | ICD-10-CM | POA: Diagnosis not present

## 2019-06-24 DIAGNOSIS — Z992 Dependence on renal dialysis: Secondary | ICD-10-CM | POA: Diagnosis not present

## 2019-06-24 DIAGNOSIS — D631 Anemia in chronic kidney disease: Secondary | ICD-10-CM | POA: Diagnosis not present

## 2019-06-25 ENCOUNTER — Other Ambulatory Visit: Payer: Self-pay

## 2019-06-25 ENCOUNTER — Encounter: Payer: Self-pay | Admitting: Family Medicine

## 2019-06-25 ENCOUNTER — Ambulatory Visit (INDEPENDENT_AMBULATORY_CARE_PROVIDER_SITE_OTHER): Payer: Medicare Other | Admitting: Family Medicine

## 2019-06-25 VITALS — BP 135/70 | HR 78 | Temp 98.9°F | Ht 65.0 in | Wt 147.0 lb

## 2019-06-25 DIAGNOSIS — H7291 Unspecified perforation of tympanic membrane, right ear: Secondary | ICD-10-CM | POA: Diagnosis not present

## 2019-06-25 MED ORDER — OFLOXACIN 0.3 % OT SOLN: 5.0000 [drp] | Freq: Two times a day (BID) | OTIC | 0 refills | Status: DC

## 2019-06-25 NOTE — Progress Notes (Signed)
11/20/20209:56 AM  William [name] 1951-03-09, 68 y.o., male [id number]  Chief Complaint  Patient presents with  . Ear Problem    fullness in the right ear    HPI:   Patient is a 68 y.o. male with past medical history significant for ESRD on HD who presents today for right ear drainage  Patient has noted right ear drainage for past several days Denies any pain, hearing loss, fever or chills  Interpreter - son  Depression screen [facility] 2/9 04/21/2018 03/09/2018 06/20/2017  Decreased Interest 0 0 2  Down, Depressed, Hopeless 0 - 2  PHQ - 2 Score 0 0 4  Altered sleeping - - 1  Tired, decreased energy - - 2  Feeling bad or failure about yourself  - - 0  Trouble concentrating - - 0  Moving slowly or fidgety/restless - - 0  Suicidal thoughts - - 0  PHQ-9 Score - - 7    Fall Risk  06/25/2019 04/21/2018 03/09/2018  Falls in the past year? 0 No No  Number falls in past yr: 0 - -  Injury with Fall? 0 - -     Allergies  Allergen Reactions  . Heparin Other (See Comments)    Patient is Muslim and is not permitted Pork derative due to religious belief)  . Pork-Derived Products Other (See Comments)    Patient does not eat pork due to religious beliefs    Prior to Admission medications   Medication Sig Start Date End Date Taking? Authorizing Provider  acetaminophen (TYLENOL) 650 MG CR tablet Take 650 mg by mouth every 8 (eight) hours as needed for pain.   Yes [provider]  amLODipine (NORVASC) 10 MG tablet Take 1 tablet (10 mg total) by mouth daily. Patient taking differently: Take 5 mg by mouth daily.  04/21/18 09/25/19 Yes Sagardia, Ines Bloomer, MD  calcium acetate (PHOSLO) 667 MG capsule Take 2 capsules (1,334 mg total) by mouth 3 (three) times daily with meals. Patient taking differently: Take 667 mg by mouth 3 (three) times daily with meals.  01/26/18  Yes Purohit, Konrad Dolores, MD  carvedilol (COREG) 3.125 MG tablet Take 1 tablet (3.125 mg total) by mouth 2 (two) times daily.  05/03/19 08/01/19 Yes Sagardia, Ines Bloomer, MD  ferrous sulfate 325 (65 FE) MG tablet Take 325 mg by mouth daily with breakfast.   Yes [provider]  glucose blood (ONETOUCH VERIO) test strip USE TO CHECK BLOOD SUGAR THREE TIMES DAILY. DX:E11.65 04/21/19  Yes Elayne Snare, MD  insulin degludec (TRESIBA FLEXTOUCH) 100 UNIT/ML SOPN FlexTouch Pen Inject 0.14 mLs (14 Units total) into the skin daily. Inject 14 units under the skin once daily. Patient taking differently: Inject 12 Units into the skin daily.  03/02/19  Yes Elayne Snare, MD  Insulin Pen Needle (ADVOCATE INSULIN PEN NEEDLES) 31G X 5 MM MISC Use four times daily to inject insulin 04/27/18  Yes Elayne Snare, MD  insulin regular (NOVOLIN R,HUMULIN R) 100 units/mL injection Inject 6-10 Units into the skin See admin instructions. INJECT 8 UNITS UNDER THE SKIN AT BREAKFAST, 10 UNITS AT LUNCH AND 8 UNITS AT DINNER.   Yes [provider]  lidocaine-prilocaine (EMLA) cream Apply 1 application topically Every Tuesday,Thursday,and Saturday with dialysis. 08/24/18  Yes [provider]  Multiple Vitamins-Minerals (MULTIVITAMIN WITH MINERALS) tablet Take 1 tablet by mouth daily.   Yes [provider]  ONE TOUCH LANCETS MISC Use to test blood sugar three times daily 04/27/18  Yes Elayne Snare,  MD  Polyethyl Glycol-Propyl Glycol (SYSTANE) 0.4-0.3 % SOLN Place 1 drop into both eyes 3 (three) times daily as needed (for dry eyes).   Yes [provider]  rosuvastatin (CRESTOR) 5 MG tablet Take 1 tablet (5 mg total) by mouth daily. 03/17/19  Yes Elayne Snare, MD    Past Medical History:  Diagnosis Date  . Anemia   . Blind one eye   . CHF (congestive heart failure) ([place])   . Cirrhosis ([place])   . Cirrhosis ([place])   . CKD (chronic kidney disease), stage IV ([place])   . Dialysis patient ([place])   . Goodpasture's disease [facility])    on outpatient plasmapheresis/notes 11/20/2017  . Grade I diastolic dysfunction 96/28/3662  . History  of anemia due to chronic kidney disease   . History of blood transfusion X 1   "UGIB; low blood count"  . History of plasmapheresis    "qod" (11/20/2017)  . Hypertension   . Pancytopenia ([place]) 08/20/2017  . Type II diabetes mellitus ([place])     Past Surgical History:  Procedure Laterality Date  . AV FISTULA PLACEMENT Left 12/26/2017   Procedure: LEFT BRACHIOCEPHALIC ARTERIOVENOUS (AV) FISTULA CREATION;  Surgeon: Conrad [place], MD;  Location: [place];  Service: Vascular;  Laterality: Left;  . BASCILIC VEIN TRANSPOSITION Left 02/16/2018   Procedure: BRACHIOBASILIC VEIN TRANSPOSITION SECOND STAGE;  Surgeon: Conrad Frierson, MD;  Location: [place];  Service: Vascular;  Laterality: Left;  . CATARACT EXTRACTION W/ INTRAOCULAR LENS  IMPLANT, BILATERAL Bilateral   . ESOPHAGEAL BANDING N/A 04/01/2018   Procedure: ESOPHAGEAL BANDING;  Surgeon: Otis Brace, MD;  [facility];  Service: Gastroenterology;  Laterality: N/A;  . ESOPHAGEAL BANDING N/A 06/29/2018   Procedure: ESOPHAGEAL BANDING;  Surgeon: Otis Brace, MD;  [facility];  Service: Gastroenterology;  Laterality: N/A;  . ESOPHAGOGASTRODUODENOSCOPY (EGD) WITH PROPOFOL N/A 04/01/2018   Procedure: ESOPHAGOGASTRODUODENOSCOPY (EGD) WITH PROPOFOL;  Surgeon: Otis Brace, MD;  [facility];  Service: Gastroenterology;  Laterality: N/A;  . ESOPHAGOGASTRODUODENOSCOPY (EGD) WITH PROPOFOL N/A 06/29/2018   Procedure: ESOPHAGOGASTRODUODENOSCOPY (EGD) WITH PROPOFOL;  Surgeon: Otis Brace, MD;  [facility];  Service: Gastroenterology;  Laterality: N/A;  . ESOPHAGOGASTRODUODENOSCOPY (EGD) WITH PROPOFOL N/A 09/30/2018   Procedure: ESOPHAGOGASTRODUODENOSCOPY (EGD) WITH PROPOFOL;  Surgeon: Otis Brace, MD;  [facility];  Service: Gastroenterology;  Laterality: N/A;  . ESOPHAGOGASTRODUODENOSCOPY (EGD) WITH PROPOFOL N/A 04/20/2019   Procedure: ESOPHAGOGASTRODUODENOSCOPY (EGD) WITH PROPOFOL;   Surgeon: Otis Brace, MD;  [facility];  Service: Gastroenterology;  Laterality: N/A;  . I&D EXTREMITY Left 02/18/2018   Procedure: IRRIGATION AND DEBRIDEMENT LEFT ARM;  Surgeon: Serafina Mitchell, MD;  Location: [place];  Service: Vascular;  Laterality: Left;  . INSERTION OF DIALYSIS CATHETER N/A 01/19/2018   Procedure: INSERTION OF DIALYSIS CATHETER - RIGHT INTERNAL JUGULAR PLACEMENT;  Surgeon: Rosetta Posner, MD;  Location: [place];  Service: Vascular;  Laterality: N/A;  . IR FLUORO GUIDE CV LINE RIGHT  11/10/2017  . IR PARACENTESIS  12/10/2017  . IR PARACENTESIS  12/26/2017  . IR REMOVAL TUN CV CATH W/O FL  11/28/2017  . IR THROMBECTOMY AV FISTULA W/THROMBOLYSIS/PTA INC/SHUNT/IMG LEFT Left 06/02/2019  . IR US GUIDE VASC ACCESS LEFT  06/02/2019  . IR US GUIDE VASC ACCESS RIGHT  11/10/2017  . NECK SURGERY  2007   "back of neck; no hardware in there"    Social History   Tobacco Use  . Smoking status: Never Smoker  . Smokeless tobacco: Never Used  Substance Use Topics  . Alcohol use: Never    Frequency: Never    Family History  Problem Relation Age of Onset  . Diabetes Mellitus II Sister   . Diabetes Sister   . Stroke Brother   . Heart attack Brother     ROS Per hpi  OBJECTIVE:  Today's Vitals   06/25/19 0936  BP: 135/70  Pulse: 78  Temp: 98.9 F (37.2 C)  SpO2: 95%  Weight: 147 lb (66.7 kg)  Height: 5' 5"  (1.651 m)   Body mass index is 24.46 kg/m.   Physical Exam  Gen: AAOx3, NAD Right ear: external ear normal, canal mild erythematous with purulent debris, no swelling. TM not erythematous, with small perforation ~ 4 oclock. Non mastoid TTP. No LAD.   No results found for this or any previous visit (from the past 24 hour(s)).  No results found.   ASSESSMENT and PLAN  1. Perforated ear drum, right Discussed supportive measures, new meds r/se/b and RTC precautions. Patient educational handout given.  Other orders - ofloxacin (FLOXIN) 0.3 % OTIC  solution; Place 5 drops into the right ear 2 (two) times daily.  Return in about 2 weeks (around 07/09/2019).    Rutherford Guys, MD Primary Care at [place],  [address] Ph.  [phone] Fax [phone]

## 2019-06-25 NOTE — Patient Instructions (Addendum)
?Avoid getting water in the affected ear when bathing or showering (ie, use a cotton ball coated with petroleum jelly in the ear to create a barrier)   Eardrum Rupture  An eardrum rupture is a tear (perforation) that makes a hole in the eardrum. The eardrum is a thin, round tissue inside the ear. It allows you to hear. This condition often heals on its own. There is often little or no long-term hearing loss. Eardrum rupture can be caused by an infection, an injury, long-term ear problems, or surgery on the ear. This condition can cause you to have:  Pain.  Ringing in the ear.  Fluid leaking from the ear.  Hearing loss.  Dizziness. Follow these instructions at home: Medicines  Take over-the-counter and prescription medicines only as told by your doctor.  If you were prescribed an antibiotic medicine, use it as told by your doctor. Do not stop using the antibiotic even if you start to feel better. Caring for your ear  Keep your ear dry while it heals: ? Do not let your head go under water. ? Do not swim or dive until your doctor says it is okay.  Before you take a bath or shower, place a waterproof earplug in your ear to keep water out of your ear.  When you play sports in which ear injuries may happen, wear a headgear with ear protection. Managing pain and swelling  If told, apply heat to the affected ear. Use a moist heat pack, or a heating pad, or another heat source that your doctor tells you. ? Place a towel between your skin and the heat source. ? Leave the heat on for 20-30 minutes. ? Remove the heat if your skin turns bright red. You must remove the heat if you are unable to feel pain, heat, or cold. You are more likely to get burned. General instructions  Continue your normal activities after your eardrum heals. Your doctor will tell you when your eardrum has healed.  Talk to your doctor before you fly on an airplane.  Keep all follow-up visits as told by your  doctor. This is important. Contact a doctor if you:  Have mucus, blood, or pus leaking from your ear.  Have a fever.  Have ear pain.  Cannot hear.  Have ringing in the ear.  Feel dizzy. Get help right away if you:  Have sudden hearing loss.  Become very dizzy.  Get very bad pain in your ear.  Have weakness in your face.  Cannot move parts of your face. Summary  An eardrum rupture is a tear that makes a hole in the eardrum.  The eardrum will likely heal on its own within a few weeks.  Follow instructions from your doctor about how to keep your ear dry and protected as it heals. This information is not intended to replace advice given to you by your health care provider. Make sure you discuss any questions you have with your health care provider. Document Released: 01/09/2010 Document Revised: 08/08/2017 Document Reviewed: 08/08/2017 Elsevier Patient Education  [employer].   If you have lab work done today you will be contacted with your lab results within the next 2 weeks.  If you have not heard from [place] then please contact us. The fastest way to get your results is to register for My Chart.   IF you received an x-ray today, you will receive an invoice from [facility]. Please contact [facility] at [phone] with  questions or concerns regarding your invoice.   IF you received labwork today, you will receive an invoice from [facility]. Please contact [facility] at [phone] with questions or concerns regarding your invoice.   Our billing staff will not be able to assist you with questions regarding bills from these companies.  You will be contacted with the lab results as soon as they are available. The fastest way to get your results is to activate your My Chart account. Instructions are located on the last page of this paperwork. If you have not heard from [place] regarding the results in 2 weeks, please contact this office.

## 2019-06-26 DIAGNOSIS — D631 Anemia in chronic kidney disease: Secondary | ICD-10-CM | POA: Diagnosis not present

## 2019-06-26 DIAGNOSIS — Z992 Dependence on renal dialysis: Secondary | ICD-10-CM | POA: Diagnosis not present

## 2019-06-26 DIAGNOSIS — D509 Iron deficiency anemia, unspecified: Secondary | ICD-10-CM | POA: Diagnosis not present

## 2019-06-26 DIAGNOSIS — Z23 Encounter for immunization: Secondary | ICD-10-CM | POA: Diagnosis not present

## 2019-06-26 DIAGNOSIS — N186 End stage renal disease: Secondary | ICD-10-CM | POA: Diagnosis not present

## 2019-06-26 DIAGNOSIS — N2581 Secondary hyperparathyroidism of renal origin: Secondary | ICD-10-CM | POA: Diagnosis not present

## 2019-06-28 DIAGNOSIS — Z23 Encounter for immunization: Secondary | ICD-10-CM | POA: Diagnosis not present

## 2019-06-28 DIAGNOSIS — D631 Anemia in chronic kidney disease: Secondary | ICD-10-CM | POA: Diagnosis not present

## 2019-06-28 DIAGNOSIS — N186 End stage renal disease: Secondary | ICD-10-CM | POA: Diagnosis not present

## 2019-06-28 DIAGNOSIS — Z992 Dependence on renal dialysis: Secondary | ICD-10-CM | POA: Diagnosis not present

## 2019-06-28 DIAGNOSIS — D509 Iron deficiency anemia, unspecified: Secondary | ICD-10-CM | POA: Diagnosis not present

## 2019-06-28 DIAGNOSIS — N2581 Secondary hyperparathyroidism of renal origin: Secondary | ICD-10-CM | POA: Diagnosis not present

## 2019-06-30 DIAGNOSIS — N2581 Secondary hyperparathyroidism of renal origin: Secondary | ICD-10-CM | POA: Diagnosis not present

## 2019-06-30 DIAGNOSIS — Z992 Dependence on renal dialysis: Secondary | ICD-10-CM | POA: Diagnosis not present

## 2019-06-30 DIAGNOSIS — N186 End stage renal disease: Secondary | ICD-10-CM | POA: Diagnosis not present

## 2019-06-30 DIAGNOSIS — Z23 Encounter for immunization: Secondary | ICD-10-CM | POA: Diagnosis not present

## 2019-06-30 DIAGNOSIS — D631 Anemia in chronic kidney disease: Secondary | ICD-10-CM | POA: Diagnosis not present

## 2019-06-30 DIAGNOSIS — D509 Iron deficiency anemia, unspecified: Secondary | ICD-10-CM | POA: Diagnosis not present

## 2019-07-03 DIAGNOSIS — Z23 Encounter for immunization: Secondary | ICD-10-CM | POA: Diagnosis not present

## 2019-07-03 DIAGNOSIS — N2581 Secondary hyperparathyroidism of renal origin: Secondary | ICD-10-CM | POA: Diagnosis not present

## 2019-07-03 DIAGNOSIS — D509 Iron deficiency anemia, unspecified: Secondary | ICD-10-CM | POA: Diagnosis not present

## 2019-07-03 DIAGNOSIS — D631 Anemia in chronic kidney disease: Secondary | ICD-10-CM | POA: Diagnosis not present

## 2019-07-03 DIAGNOSIS — Z992 Dependence on renal dialysis: Secondary | ICD-10-CM | POA: Diagnosis not present

## 2019-07-03 DIAGNOSIS — N186 End stage renal disease: Secondary | ICD-10-CM | POA: Diagnosis not present

## 2019-07-06 DIAGNOSIS — D631 Anemia in chronic kidney disease: Secondary | ICD-10-CM | POA: Diagnosis not present

## 2019-07-06 DIAGNOSIS — Z992 Dependence on renal dialysis: Secondary | ICD-10-CM | POA: Diagnosis not present

## 2019-07-06 DIAGNOSIS — Z23 Encounter for immunization: Secondary | ICD-10-CM | POA: Diagnosis not present

## 2019-07-06 DIAGNOSIS — N2581 Secondary hyperparathyroidism of renal origin: Secondary | ICD-10-CM | POA: Diagnosis not present

## 2019-07-06 DIAGNOSIS — D509 Iron deficiency anemia, unspecified: Secondary | ICD-10-CM | POA: Diagnosis not present

## 2019-07-06 DIAGNOSIS — N186 End stage renal disease: Secondary | ICD-10-CM | POA: Diagnosis not present

## 2019-07-06 DIAGNOSIS — E1122 Type 2 diabetes mellitus with diabetic chronic kidney disease: Secondary | ICD-10-CM | POA: Diagnosis not present

## 2019-07-07 DIAGNOSIS — K7469 Other cirrhosis of liver: Secondary | ICD-10-CM | POA: Diagnosis not present

## 2019-07-07 DIAGNOSIS — N186 End stage renal disease: Secondary | ICD-10-CM | POA: Diagnosis not present

## 2019-07-07 DIAGNOSIS — Z992 Dependence on renal dialysis: Secondary | ICD-10-CM | POA: Diagnosis not present

## 2019-07-07 DIAGNOSIS — E875 Hyperkalemia: Secondary | ICD-10-CM | POA: Diagnosis not present

## 2019-07-07 DIAGNOSIS — K045 Chronic apical periodontitis: Secondary | ICD-10-CM | POA: Diagnosis not present

## 2019-07-07 DIAGNOSIS — T82868A Thrombosis of vascular prosthetic devices, implants and grafts, initial encounter: Secondary | ICD-10-CM | POA: Diagnosis not present

## 2019-07-07 DIAGNOSIS — Z01818 Encounter for other preprocedural examination: Secondary | ICD-10-CM | POA: Diagnosis not present

## 2019-07-12 ENCOUNTER — Ambulatory Visit (INDEPENDENT_AMBULATORY_CARE_PROVIDER_SITE_OTHER): Payer: Medicare Other | Admitting: Family Medicine

## 2019-07-12 ENCOUNTER — Other Ambulatory Visit: Payer: Self-pay

## 2019-07-12 ENCOUNTER — Encounter: Payer: Self-pay | Admitting: Family Medicine

## 2019-07-12 VITALS — BP 154/83 | HR 68 | Temp 98.0°F | Ht 65.0 in | Wt 152.6 lb

## 2019-07-12 DIAGNOSIS — J302 Other seasonal allergic rhinitis: Secondary | ICD-10-CM

## 2019-07-12 DIAGNOSIS — H7291 Unspecified perforation of tympanic membrane, right ear: Secondary | ICD-10-CM

## 2019-07-12 MED ORDER — CETIRIZINE HCL 5 MG PO TABS: 5.0000 mg | ORAL_TABLET | ORAL | 0 refills | Status: DC

## 2019-07-12 NOTE — Patient Instructions (Signed)
° ° ° °  If you have lab work done today you will be contacted with your lab results within the next 2 weeks.  If you have not heard from us then please contact us. The fastest way to get your results is to register for My Chart. ° ° °IF you received an x-ray today, you will receive an invoice from [facility]. Please contact [facility] at [phone] with questions or concerns regarding your invoice.  ° °IF you received labwork today, you will receive an invoice from [facility]. Please contact [facility] at [phone] with questions or concerns regarding your invoice.  ° °Our billing staff will not be able to assist you with questions regarding bills from these companies. ° °You will be contacted with the lab results as soon as they are available. The fastest way to get your results is to activate your My Chart account. Instructions are located on the last page of this paperwork. If you have not heard from us regarding the results in 2 weeks, please contact this office. °  ° ° ° °

## 2019-07-12 NOTE — Progress Notes (Signed)
12/7/20203:13 PM  William [name] 09/24/1950, 68 y.o., male [id number]  Chief Complaint  Patient presents with  . Ear Problem    still having draining in the right ear    HPI:   Patient is a 68 y.o. male with past medical history significant for ESRD on HD who presents today for perforated Right TM  Seen 2 weeks ago - ofloxacin Completed abx Still feels he has drainage Denies any hearing loss, ear pain, ringing Denies any fever or chills  Requesting med for allergies, clear runny nose, sneezing  Interpreter - son  Depression screen [employer] 2/9 07/12/2019 04/21/2018 03/09/2018  Decreased Interest 0 0 0  Down, Depressed, Hopeless 0 0 -  PHQ - 2 Score 0 0 0  Altered sleeping - - -  Tired, decreased energy - - -  Feeling bad or failure about yourself  - - -  Trouble concentrating - - -  Moving slowly or fidgety/restless - - -  Suicidal thoughts - - -  PHQ-9 Score - - -    Fall Risk  07/12/2019 06/25/2019 04/21/2018 03/09/2018  Falls in the past year? 0 0 No No  Number falls in past yr: 0 0 - -  Injury with Fall? 0 0 - -     Allergies  Allergen Reactions  . Heparin Other (See Comments)    Patient is Muslim and is not permitted Pork derative due to religious belief)  . Pork-Derived Products Other (See Comments)    Patient does not eat pork due to religious beliefs    Prior to Admission medications   Medication Sig Start Date End Date Taking? Authorizing Provider  acetaminophen (TYLENOL) 650 MG CR tablet Take 650 mg by mouth every 8 (eight) hours as needed for pain.   Yes [provider]  amLODipine (NORVASC) 10 MG tablet Take 1 tablet (10 mg total) by mouth daily. Patient taking differently: Take 5 mg by mouth daily.  04/21/18 09/25/19 Yes Sagardia, Ines Bloomer, MD  calcium acetate (PHOSLO) 667 MG capsule Take 2 capsules (1,334 mg total) by mouth 3 (three) times daily with meals. Patient taking differently: Take 667 mg by mouth 3 (three) times daily with meals.  01/26/18   Yes Purohit, Konrad Dolores, MD  carvedilol (COREG) 3.125 MG tablet Take 1 tablet (3.125 mg total) by mouth 2 (two) times daily. 05/03/19 08/01/19 Yes Sagardia, Ines Bloomer, MD  ferrous sulfate 325 (65 FE) MG tablet Take 325 mg by mouth daily with breakfast.   Yes [provider]  glucose blood (ONETOUCH VERIO) test strip USE TO CHECK BLOOD SUGAR THREE TIMES DAILY. DX:E11.65 04/21/19  Yes Elayne Snare, MD  insulin degludec (TRESIBA FLEXTOUCH) 100 UNIT/ML SOPN FlexTouch Pen Inject 0.14 mLs (14 Units total) into the skin daily. Inject 14 units under the skin once daily. Patient taking differently: Inject 12 Units into the skin daily.  03/02/19  Yes Elayne Snare, MD  Insulin Pen Needle (ADVOCATE INSULIN PEN NEEDLES) 31G X 5 MM MISC Use four times daily to inject insulin 04/27/18  Yes Elayne Snare, MD  insulin regular (NOVOLIN R,HUMULIN R) 100 units/mL injection Inject 6-10 Units into the skin See admin instructions. INJECT 8 UNITS UNDER THE SKIN AT BREAKFAST, 10 UNITS AT LUNCH AND 8 UNITS AT DINNER.   Yes [provider]  lidocaine-prilocaine (EMLA) cream Apply 1 application topically Every Tuesday,Thursday,and Saturday with dialysis. 08/24/18  Yes [provider]  Multiple Vitamins-Minerals (MULTIVITAMIN WITH MINERALS) tablet Take 1 tablet by mouth daily.  Yes [provider]  ofloxacin (FLOXIN) 0.3 % OTIC solution Place 5 drops into the right ear 2 (two) times daily. 06/25/19  Yes Rutherford Guys, MD  ONE TOUCH LANCETS MISC Use to test blood sugar three times daily 04/27/18  Yes Elayne Snare, MD  Polyethyl Glycol-Propyl Glycol (SYSTANE) 0.4-0.3 % SOLN Place 1 drop into both eyes 3 (three) times daily as needed (for dry eyes).   Yes [provider]  rosuvastatin (CRESTOR) 5 MG tablet Take 1 tablet (5 mg total) by mouth daily. 03/17/19  Yes Elayne Snare, MD    Past Medical History:  Diagnosis Date  . Anemia   . Blind one eye   . CHF (congestive heart failure) ([place])   .  Cirrhosis ([place])   . Cirrhosis ([place])   . CKD (chronic kidney disease), stage IV ([place])   . Dialysis patient ([employer])   . Goodpasture's disease [facility])    on outpatient plasmapheresis/notes 11/20/2017  . Grade I diastolic dysfunction 35/45/6256  . History of anemia due to chronic kidney disease   . History of blood transfusion X 1   "UGIB; low blood count"  . History of plasmapheresis    "qod" (11/20/2017)  . Hypertension   . Pancytopenia ([place]) 08/20/2017  . Type II diabetes mellitus ([place])     Past Surgical History:  Procedure Laterality Date  . AV FISTULA PLACEMENT Left 12/26/2017   Procedure: LEFT BRACHIOCEPHALIC ARTERIOVENOUS (AV) FISTULA CREATION;  Surgeon: Conrad Talmo, MD;  Location: [facility];  Service: Vascular;  Laterality: Left;  . BASCILIC VEIN TRANSPOSITION Left 02/16/2018   Procedure: BRACHIOBASILIC VEIN TRANSPOSITION SECOND STAGE;  Surgeon: Conrad [place] David, MD;  Location: [facility];  Service: Vascular;  Laterality: Left;  . CATARACT EXTRACTION W/ INTRAOCULAR LENS  IMPLANT, BILATERAL Bilateral   . ESOPHAGEAL BANDING N/A 04/01/2018   Procedure: ESOPHAGEAL BANDING;  Surgeon: Otis Brace, MD;  [facility];  Service: Gastroenterology;  Laterality: N/A;  . ESOPHAGEAL BANDING N/A 06/29/2018   Procedure: ESOPHAGEAL BANDING;  Surgeon: Otis Brace, MD;  [facility];  Service: Gastroenterology;  Laterality: N/A;  . ESOPHAGOGASTRODUODENOSCOPY (EGD) WITH PROPOFOL N/A 04/01/2018   Procedure: ESOPHAGOGASTRODUODENOSCOPY (EGD) WITH PROPOFOL;  Surgeon: Otis Brace, MD;  [facility];  Service: Gastroenterology;  Laterality: N/A;  . ESOPHAGOGASTRODUODENOSCOPY (EGD) WITH PROPOFOL N/A 06/29/2018   Procedure: ESOPHAGOGASTRODUODENOSCOPY (EGD) WITH PROPOFOL;  Surgeon: Otis Brace, MD;  [facility];  Service: Gastroenterology;  Laterality: N/A;  . ESOPHAGOGASTRODUODENOSCOPY (EGD) WITH PROPOFOL N/A 09/30/2018   Procedure: ESOPHAGOGASTRODUODENOSCOPY  (EGD) WITH PROPOFOL;  Surgeon: Otis Brace, MD;  [facility];  Service: Gastroenterology;  Laterality: N/A;  . ESOPHAGOGASTRODUODENOSCOPY (EGD) WITH PROPOFOL N/A 04/20/2019   Procedure: ESOPHAGOGASTRODUODENOSCOPY (EGD) WITH PROPOFOL;  Surgeon: Otis Brace, MD;  [facility];  Service: Gastroenterology;  Laterality: N/A;  . I&D EXTREMITY Left 02/18/2018   Procedure: IRRIGATION AND DEBRIDEMENT LEFT ARM;  Surgeon: Serafina Mitchell, MD;  Location: [facility];  Service: Vascular;  Laterality: Left;  . INSERTION OF DIALYSIS CATHETER N/A 01/19/2018   Procedure: INSERTION OF DIALYSIS CATHETER - RIGHT INTERNAL JUGULAR PLACEMENT;  Surgeon: Rosetta Posner, MD;  Location: [facility];  Service: Vascular;  Laterality: N/A;  . IR FLUORO GUIDE CV LINE RIGHT  11/10/2017  . IR PARACENTESIS  12/10/2017  . IR PARACENTESIS  12/26/2017  . IR REMOVAL TUN CV CATH W/O FL  11/28/2017  . IR THROMBECTOMY AV FISTULA W/THROMBOLYSIS/PTA INC/SHUNT/IMG LEFT Left 06/02/2019  . IR US GUIDE VASC ACCESS LEFT  06/02/2019  . IR US GUIDE VASC ACCESS RIGHT  11/10/2017  . NECK SURGERY  2007   "back of neck; no hardware in there"    Social History   Tobacco Use  . Smoking status: Never Smoker  . Smokeless tobacco: Never Used  Substance Use Topics  . Alcohol use: Never    Frequency: Never    Family History  Problem Relation Age of Onset  . Diabetes Mellitus II Sister   . Diabetes Sister   . Stroke Brother   . Heart attack Brother     ROS Per hpi  OBJECTIVE:  Today's Vitals   07/12/19 1512  BP: (!) 154/83  Pulse: 68  Temp: 98 F (36.7 C)  SpO2: 93%  Weight: 152 lb 9.6 oz (69.2 kg)  Height: 5' 5"  (1.651 m)   Body mass index is 25.39 kg/m.   Physical Exam   Gen: AAOx3, NAD Right ear: external ear normal, canal not erythematous, no swelling, no drainage. TM erythematous, perforation ~ 4 oclock. No mastoid TTP. No LAD.  No results found for this or any previous visit (from the past 24 hour(s)).   No results found.   ASSESSMENT and PLAN  1. Perforated ear drum, right Not healing. Referring to ENT - Ambulatory referral to ENT  2. Seasonal allergies - cetirizine (ZYRTEC) 5 MG tablet; Take 1 tablet (5 mg total) by mouth 3 (three) times a week. Before dialysis  Return if symptoms worsen or fail to improve.    Rutherford Guys, MD Primary Care at [place], [place] [address] Ph.  [phone] Fax [phone]

## 2019-07-16 DIAGNOSIS — Z01818 Encounter for other preprocedural examination: Secondary | ICD-10-CM | POA: Diagnosis not present

## 2019-07-16 DIAGNOSIS — D649 Anemia, unspecified: Secondary | ICD-10-CM | POA: Diagnosis not present

## 2019-07-16 DIAGNOSIS — D696 Thrombocytopenia, unspecified: Secondary | ICD-10-CM | POA: Diagnosis not present

## 2019-07-16 DIAGNOSIS — N186 End stage renal disease: Secondary | ICD-10-CM | POA: Diagnosis not present

## 2019-07-16 DIAGNOSIS — R161 Splenomegaly, not elsewhere classified: Secondary | ICD-10-CM | POA: Diagnosis not present

## 2019-07-16 DIAGNOSIS — Z992 Dependence on renal dialysis: Secondary | ICD-10-CM | POA: Diagnosis not present

## 2019-07-16 DIAGNOSIS — Z7682 Awaiting organ transplant status: Secondary | ICD-10-CM | POA: Diagnosis not present

## 2019-07-16 DIAGNOSIS — M310 Hypersensitivity angiitis: Secondary | ICD-10-CM | POA: Diagnosis not present

## 2019-07-19 ENCOUNTER — Other Ambulatory Visit: Payer: Self-pay

## 2019-07-19 ENCOUNTER — Telehealth: Payer: Self-pay

## 2019-07-19 ENCOUNTER — Telehealth (INDEPENDENT_AMBULATORY_CARE_PROVIDER_SITE_OTHER): Payer: Medicare Other | Admitting: Registered Nurse

## 2019-07-19 ENCOUNTER — Encounter: Payer: Self-pay | Admitting: Registered Nurse

## 2019-07-19 ENCOUNTER — Ambulatory Visit: Payer: Self-pay | Admitting: Emergency Medicine

## 2019-07-19 VITALS — Ht 65.0 in | Wt 147.0 lb

## 2019-07-19 DIAGNOSIS — R197 Diarrhea, unspecified: Secondary | ICD-10-CM

## 2019-07-19 MED ORDER — LOPERAMIDE HCL 2 MG PO TABS: 2.0000 mg | ORAL_TABLET | Freq: Four times a day (QID) | ORAL | 0 refills | Status: DC | PRN

## 2019-07-19 NOTE — Telephone Encounter (Signed)
Nakia from [facility] called requesting bone marrow biopsy and aspiration results.  Faxed to [phone]

## 2019-07-19 NOTE — Progress Notes (Signed)
Telemedicine Encounter- SOAP NOTE Established Patient  This telephone encounter was conducted with the patient's (or proxy's) verbal consent via audio telecommunications: yes  Patient was instructed to have this encounter in a suitably private space; and to only have persons present to whom they give permission to participate. In addition, patient identity was confirmed by use of name plus two identifiers (DOB and address).  I discussed the limitations, risks, security and privacy concerns of performing an evaluation and management service by telephone and the availability of in person appointments. I also discussed with the patient that there may be a patient responsible charge related to this service. The patient expressed understanding and agreed to proceed.  I spent a total of 15 minutes talking with the patient or their proxy.  Chief Complaint  Patient presents with  . Diarrhea    pt stated---diarrhea,blood in stool,---5 days. Denied fever    Subjective   William [name] is a 68 y.o. established patient. Telephone visit today for diarrhea  HPI Pt reports onset was Friday. It has been ongoing since then. It has not worsened or improved. He notes that one episode had scant amounts of dark red blood. This occurred on Sunday. It has not recurred since, nor did it happen before. Denies abdominal pain. Denies hemoptysis. Denies fever, chills, fatigue, shob, cough, malaise, lightheadedness.  Long med hx significant for CHF, cirrhosis, renal failure, and esophageal varices.  Patient Active Problem List   Diagnosis Date Noted  . Hyperkalemia, diminished renal excretion 06/01/2019  . AV fistula thrombosis, initial encounter ([place]) 06/01/2019  . AV fistula occlusion, initial encounter ([place])   . Chronic systolic CHF (congestive heart failure) ([place])   . Encounter for other preprocedural examination 09/23/2018  . Esophageal varices ([place]) 09/23/2018  . Thrombocytopenia ([place]) 09/23/2018  . CHF  (congestive heart failure) ([place]) 09/22/2018  . ESRD on dialysis ([place]) 02/06/2018  . CKD (chronic kidney disease) stage 4, GFR 15-29 ml/min ([facility]) 12/19/2017  . Cirrhosis of liver with ascites ([place]) 12/09/2017  . Goodpasture's syndrome ([place]) with associated hemoptysis 11/20/2017  . Acute on chronic diastolic CHF (congestive heart failure) ([place]) 11/06/2017  . Pancytopenia ([place]) 11/06/2017  . GERD (gastroesophageal reflux disease) 11/06/2017  . Essential hypertension 11/06/2017  . Type II diabetes mellitus with renal manifestations ([place]) 08/25/2015    Past Medical History:  Diagnosis Date  . Anemia   . Blind one eye   . CHF (congestive heart failure) ([place])   . Cirrhosis ([place])   . Cirrhosis ([place])   . CKD (chronic kidney disease), stage IV ([place])   . Dialysis patient ([place])   . Goodpasture's disease [facility])    on outpatient plasmapheresis/notes 11/20/2017  . Grade I diastolic dysfunction 41/96/2229  . History of anemia due to chronic kidney disease   . History of blood transfusion X 1   "UGIB; low blood count"  . History of plasmapheresis    "qod" (11/20/2017)  . Hypertension   . Pancytopenia ([place]) 08/20/2017  . Type II diabetes mellitus ([place])     Current Outpatient Medications  Medication Sig Dispense Refill  . acetaminophen (TYLENOL) 650 MG CR tablet Take 650 mg by mouth every 8 (eight) hours as needed for pain.    Marland Kitchen amLODipine (NORVASC) 10 MG tablet Take 1 tablet (10 mg total) by mouth daily. (Patient taking differently: Take 5 mg by mouth daily. ) 90 tablet 3  . calcium acetate (PHOSLO) 667 MG capsule Take 2 capsules (1,334 mg total) by mouth 3 (three)  times daily with meals. (Patient taking differently: Take 667 mg by mouth 3 (three) times daily with meals. ) 90 capsule 1  . carvedilol (COREG) 3.125 MG tablet Take 1 tablet (3.125 mg total) by mouth 2 (two) times daily. 180 tablet 1  . cetirizine (ZYRTEC) 5 MG tablet Take 1 tablet (5 mg total) by mouth 3 (three) times a week. Before  dialysis 90 tablet 0  . ferrous sulfate 325 (65 FE) MG tablet Take 325 mg by mouth daily with breakfast.    . glucose blood (ONETOUCH VERIO) test strip USE TO CHECK BLOOD SUGAR THREE TIMES DAILY. DX:E11.65 150 each 3  . insulin degludec (TRESIBA FLEXTOUCH) 100 UNIT/ML SOPN FlexTouch Pen Inject 0.14 mLs (14 Units total) into the skin daily. Inject 14 units under the skin once daily. (Patient taking differently: Inject 12 Units into the skin daily. ) 2 pen 3  . Insulin Pen Needle (ADVOCATE INSULIN PEN NEEDLES) 31G X 5 MM MISC Use four times daily to inject insulin 150 each 1  . insulin regular (NOVOLIN R,HUMULIN R) 100 units/mL injection Inject 6-10 Units into the skin See admin instructions. INJECT 8 UNITS UNDER THE SKIN AT BREAKFAST, 10 UNITS AT LUNCH AND 8 UNITS AT DINNER.    Marland Kitchen lidocaine-prilocaine (EMLA) cream Apply 1 application topically Every Tuesday,Thursday,and Saturday with dialysis.    . Multiple Vitamins-Minerals (MULTIVITAMIN WITH MINERALS) tablet Take 1 tablet by mouth daily.    Marland Kitchen ofloxacin (FLOXIN) 0.3 % OTIC solution Place 5 drops into the right ear 2 (two) times daily. 5 mL 0  . ONE TOUCH LANCETS MISC Use to test blood sugar three times daily 100 each 2  . Polyethyl Glycol-Propyl Glycol (SYSTANE) 0.4-0.3 % SOLN Place 1 drop into both eyes 3 (three) times daily as needed (for dry eyes).    . rosuvastatin (CRESTOR) 5 MG tablet Take 1 tablet (5 mg total) by mouth daily. 90 tablet 3  . loperamide (IMODIUM A-D) 2 MG tablet Take 1 tablet (2 mg total) by mouth 4 (four) times daily as needed for diarrhea or loose stools. 30 tablet 0   No current facility-administered medications for this visit.    Allergies  Allergen Reactions  . Heparin Other (See Comments)    Patient is Muslim and is not permitted Pork derative due to religious belief)  . Pork-Derived Products Other (See Comments)    Patient does not eat pork due to religious beliefs    Social History   Socioeconomic History  .  Marital status: Married    Spouse name: Not on file  . Number of children: Not on file  . Years of education: Not on file  . Highest education level: Not on file  Occupational History  . Not on file  Tobacco Use  . Smoking status: Never Smoker  . Smokeless tobacco: Never Used  Substance and Sexual Activity  . Alcohol use: Never  . Drug use: Never  . Sexual activity: Not on file  Other Topics Concern  . Not on file  Social History Narrative  . Not on file   Social Determinants of Health   Financial Resource Strain:   . Difficulty of Paying Living Expenses: Not on file  Food Insecurity:   . Worried About Charity fundraiser in the Last Year: Not on file  . Ran Out of Food in the Last Year: Not on file  Transportation Needs:   . Lack of Transportation (Medical): Not on file  . Lack of Transportation (Non-Medical):  Not on file  Physical Activity:   . Days of Exercise per Week: Not on file  . Minutes of Exercise per Session: Not on file  Stress:   . Feeling of Stress : Not on file  Social Connections:   . Frequency of Communication with Friends and Family: Not on file  . Frequency of Social Gatherings with Friends and Family: Not on file  . Attends Religious Services: Not on file  . Active Member of Clubs or Organizations: Not on file  . Attends Archivist Meetings: Not on file  . Marital Status: Not on file  Intimate Partner Violence:   . Fear of Current or Ex-Partner: Not on file  . Emotionally Abused: Not on file  . Physically Abused: Not on file  . Sexually Abused: Not on file    Review of Systems  Constitutional: Negative.   HENT: Negative.   Eyes: Negative.   Respiratory: Negative.   Cardiovascular: Negative.   Gastrointestinal: Positive for diarrhea and melena. Negative for abdominal pain, blood in stool, constipation, heartburn, nausea and vomiting.  Genitourinary: Negative.   Musculoskeletal: Negative.   Skin: Negative.   Neurological: Negative.    Endo/Heme/Allergies: Negative.   Psychiatric/Behavioral: Negative.   All other systems reviewed and are negative.   Objective   Vitals as reported by the patient: Today's Vitals   07/19/19 1602  Weight: 147 lb (66.7 kg)  Height: 5' 5"  (1.651 m)    Roran was seen today for diarrhea.  Diagnoses and all orders for this visit:  Diarrhea, unspecified type -     loperamide (IMODIUM A-D) 2 MG tablet; Take 1 tablet (2 mg total) by mouth 4 (four) times daily as needed for diarrhea or loose stools.   PLAN  Try imodium - take up to four times daily as needed. Replenish fluids with water and pedialyte.  If no relief, pt would benefit from ED visit  Given strict ED precautions, including any further blood in stool, melena, or changes to stool. This patient has a number of factors putting him at risk for a GI bleed. He demonstrated the importance of proceeding to the ED when appropriate  Patient encouraged to call clinic with any questions, comments, or concerns.   I discussed the assessment and treatment plan with the patient. The patient was provided an opportunity to ask questions and all were answered. The patient agreed with the plan and demonstrated an understanding of the instructions.   The patient was advised to call back or seek an in-person evaluation if the symptoms worsen or if the condition fails to improve as anticipated.  I provided 15 minutes of non-face-to-face time during this encounter.  Maximiano Coss, NP  Primary Care at [facility]

## 2019-07-19 NOTE — Telephone Encounter (Signed)
Assisted by translator Nira Conn' # [phone]  Pt reports diarrhea x 1 week. States 10-15 times a day.States loose but watery at times. Denies any dizziness, no weakness , no abdominal pain. States is urinating as usual. Denies fever. No nausea or vomiting, has not traveled. States small amount of dark red blood noted in stool one time yesterday. States is staying hydrated, denies any signs of dehydration. Pt initially declined appt. Made aware appt would be virtual with his symptoms. Pt agreed to virtual appt. Advised if appt could not be scheduled this afternoon, would need to go to ED. Verbalizes understanding.  Call transferred to practice for consideration of appt, interpretor on line.   Reason for Disposition . [1] SEVERE diarrhea (e.g., 7 or more times / day more than normal) AND [2] age > 60 years  Answer Assessment - Initial Assessment Questions 1. DIARRHEA SEVERITY: "How bad is the diarrhea?" "How many extra stools have you had in the past 24 hours than normal?"    - NO DIARRHEA (SCALE 0)   - MILD (SCALE 1-3): Few loose or mushy BMs; increase of 1-3 stools over normal daily number of stools; mild increase in ostomy output.   -  MODERATE (SCALE 4-7): Increase of 4-6 stools daily over normal; moderate increase in ostomy output. * SEVERE (SCALE 8-10; OR 'WORST POSSIBLE'): Increase of 7 or more stools daily over normal; moderate increase in ostomy output; incontinence.     severe 2. ONSET: "When did the diarrhea begin?"      1 week ago 3. BM CONSISTENCY: "How loose or watery is the diarrhea?"      Sometimes watery 4. VOMITING: "Are you also vomiting?" If so, ask: "How many times in the past 24 hours?"      no 5. ABDOMINAL PAIN: "Are you having any abdominal pain?" If yes: "What does it feel like?" (e.g., crampy, dull, intermittent, constant)      no 6. ABDOMINAL PAIN SEVERITY: If present, ask: "How bad is the pain?"  (e.g., Scale 1-10; mild, moderate, or severe)   - MILD (1-3): doesn't  interfere with normal activities, abdomen soft and not tender to touch    - MODERATE (4-7): interferes with normal activities or awakens from sleep, tender to touch    - SEVERE (8-10): excruciating pain, doubled over, unable to do any normal activities       NA 7. ORAL INTAKE: If vomiting, "Have you been able to drink liquids?" "How much fluids have you had in the past 24 hours?"     NA 8. HYDRATION: "Any signs of dehydration?" (e.g., dry mouth [not just dry lips], too weak to stand, dizziness, new weight loss) "When did you last urinate?"     no 9. EXPOSURE: "Have you traveled to a foreign country recently?" "Have you been exposed to anyone with diarrhea?" "Could you have eaten any food that was spoiled?"     no 10. ANTIBIOTIC USE: "Are you taking antibiotics now or have you taken antibiotics in the past 2 months?"       no 11. OTHER SYMPTOMS: "Do you have any other symptoms?" (e.g., fever, blood in stool)      Yesterday dark red blood in stool,"Very little"  Protocols used: DIARRHEA-A-AH

## 2019-08-06 DIAGNOSIS — E1122 Type 2 diabetes mellitus with diabetic chronic kidney disease: Secondary | ICD-10-CM | POA: Diagnosis not present

## 2019-08-06 DIAGNOSIS — N186 End stage renal disease: Secondary | ICD-10-CM | POA: Diagnosis not present

## 2019-08-06 DIAGNOSIS — Z992 Dependence on renal dialysis: Secondary | ICD-10-CM | POA: Diagnosis not present

## 2019-08-08 DIAGNOSIS — N2581 Secondary hyperparathyroidism of renal origin: Secondary | ICD-10-CM | POA: Diagnosis not present

## 2019-08-08 DIAGNOSIS — D509 Iron deficiency anemia, unspecified: Secondary | ICD-10-CM | POA: Diagnosis not present

## 2019-08-08 DIAGNOSIS — D631 Anemia in chronic kidney disease: Secondary | ICD-10-CM | POA: Diagnosis not present

## 2019-08-08 DIAGNOSIS — Z992 Dependence on renal dialysis: Secondary | ICD-10-CM | POA: Diagnosis not present

## 2019-08-08 DIAGNOSIS — N186 End stage renal disease: Secondary | ICD-10-CM | POA: Diagnosis not present

## 2019-08-09 ENCOUNTER — Encounter (HOSPITAL_COMMUNITY): Payer: Self-pay

## 2019-08-09 ENCOUNTER — Ambulatory Visit (HOSPITAL_COMMUNITY)
Admission: EM | Admit: 2019-08-09 | Discharge: 2019-08-09 | Disposition: A | Discharge status: Home / Self Care | Payer: Medicare Other | Attending: Family Medicine | Admitting: Family Medicine

## 2019-08-09 DIAGNOSIS — Z794 Long term (current) use of insulin: Secondary | ICD-10-CM | POA: Diagnosis not present

## 2019-08-09 DIAGNOSIS — H9201 Otalgia, right ear: Secondary | ICD-10-CM | POA: Diagnosis not present

## 2019-08-09 DIAGNOSIS — I132 Hypertensive heart and chronic kidney disease with heart failure and with stage 5 chronic kidney disease, or end stage renal disease: Secondary | ICD-10-CM | POA: Insufficient documentation

## 2019-08-09 DIAGNOSIS — R05 Cough: Secondary | ICD-10-CM | POA: Insufficient documentation

## 2019-08-09 DIAGNOSIS — E1122 Type 2 diabetes mellitus with diabetic chronic kidney disease: Secondary | ICD-10-CM | POA: Insufficient documentation

## 2019-08-09 DIAGNOSIS — Z992 Dependence on renal dialysis: Secondary | ICD-10-CM | POA: Diagnosis not present

## 2019-08-09 DIAGNOSIS — Z823 Family history of stroke: Secondary | ICD-10-CM | POA: Diagnosis not present

## 2019-08-09 DIAGNOSIS — N186 End stage renal disease: Secondary | ICD-10-CM | POA: Insufficient documentation

## 2019-08-09 DIAGNOSIS — D631 Anemia in chronic kidney disease: Secondary | ICD-10-CM | POA: Diagnosis not present

## 2019-08-09 DIAGNOSIS — Z91018 Allergy to other foods: Secondary | ICD-10-CM | POA: Insufficient documentation

## 2019-08-09 DIAGNOSIS — Z888 Allergy status to other drugs, medicaments and biological substances status: Secondary | ICD-10-CM | POA: Insufficient documentation

## 2019-08-09 DIAGNOSIS — R110 Nausea: Secondary | ICD-10-CM | POA: Diagnosis not present

## 2019-08-09 DIAGNOSIS — Z79899 Other long term (current) drug therapy: Secondary | ICD-10-CM | POA: Diagnosis not present

## 2019-08-09 DIAGNOSIS — Z8249 Family history of ischemic heart disease and other diseases of the circulatory system: Secondary | ICD-10-CM | POA: Insufficient documentation

## 2019-08-09 DIAGNOSIS — Z20822 Contact with and (suspected) exposure to covid-19: Secondary | ICD-10-CM | POA: Diagnosis not present

## 2019-08-09 DIAGNOSIS — Z833 Family history of diabetes mellitus: Secondary | ICD-10-CM | POA: Diagnosis not present

## 2019-08-09 MED ORDER — ONDANSETRON 4 MG PO TBDP: 4.0000 mg | ORAL_TABLET | Freq: Three times a day (TID) | ORAL | 0 refills | Status: DC | PRN

## 2019-08-09 MED ORDER — OFLOXACIN 0.3 % OT SOLN: 5.0000 [drp] | Freq: Every day | OTIC | 0 refills | Status: DC

## 2019-08-09 NOTE — ED Triage Notes (Addendum)
Pt presents to UC with right ear pain x 1 month;  headache and nausea x 2 days.

## 2019-08-09 NOTE — ED Notes (Signed)
Son of the pt interrupted the translator all the time during the visit. Son of the patient do not have a polite behavior.

## 2019-08-09 NOTE — Discharge Instructions (Signed)
Covid swab pending, monitor my chart for results Use Zofran, dissolves under tongue, as needed for nausea/vomiting every 8 hours as needed Please use ofloxacin eardrops daily, follow-up with ear nose and throat Please contact your primary care to check on referral  Please follow-up if developing abdominal pain, fevers, worsening ear symptoms

## 2019-08-09 NOTE — ED Provider Notes (Signed)
[place]    CSN: [id number] Arrival date & time: 08/09/19  0848      History   Chief Complaint Chief Complaint  Patient presents with  . Otalgia  . Cough  . Nausea    HPI William [name] is a 69 y.o. male history of ESRD on dialysis, CHF, DM type II, presenting today for evaluation of ear pain and nausea.  Patient states that over the past 1 to 2 months he has had ear pain.  Initially was seen by primary care was noted to have a TM perforation.  Was treated with ofloxacin for externa otitis.  Is awaiting to follow-up with ENT.  Pain has returned over the past 1 to 2 weeks.  He has also had nausea over the past 2 days, denies vomiting.  Denies associated abdominal pain.  Looser bowel movement last night, but no persistent diarrhea or bowel changes.  Denies fevers.  Has a chronic cough, but denies worsening of cough.  Denies any URI symptoms of congestion or sore throat.  Denies known exposure to Covid.  Patient had Covid in June, approximately 6 months ago.  HPI  Past Medical History:  Diagnosis Date  . Anemia   . Blind one eye   . CHF (congestive heart failure) ([place])   . Cirrhosis ([place])   . Cirrhosis ([place])   . CKD (chronic kidney disease), stage IV ([place])   . Dialysis patient ([place])   . Goodpasture's disease [facility])    on outpatient plasmapheresis/notes 11/20/2017  . Grade I diastolic dysfunction 38/05/1750  . History of anemia due to chronic kidney disease   . History of blood transfusion X 1   "UGIB; low blood count"  . History of plasmapheresis    "qod" (11/20/2017)  . Hypertension   . Pancytopenia ([place]) 08/20/2017  . Type II diabetes mellitus [facility])     Patient Active Problem List   Diagnosis Date Noted  . Hyperkalemia, diminished renal excretion 06/01/2019  . AV fistula thrombosis, initial encounter ([place]) 06/01/2019  . AV fistula occlusion, initial encounter ([place])   . Chronic systolic CHF (congestive heart failure) ([place])   . Encounter for other  preprocedural examination 09/23/2018  . Esophageal varices ([place]) 09/23/2018  . Thrombocytopenia ([place]) 09/23/2018  . CHF (congestive heart failure) ([place]) 09/22/2018  . ESRD on dialysis ([place]) 02/06/2018  . CKD (chronic kidney disease) stage 4, GFR 15-29 ml/min ([facility]) 12/19/2017  . Cirrhosis of liver with ascites ([place]) 12/09/2017  . Goodpasture's syndrome ([place]) with associated hemoptysis 11/20/2017  . Acute on chronic diastolic CHF (congestive heart failure) ([place]) 11/06/2017  . Pancytopenia ([place]) 11/06/2017  . GERD (gastroesophageal reflux disease) 11/06/2017  . Essential hypertension 11/06/2017  . Type II diabetes mellitus with renal manifestations ([place]) 08/25/2015    Past Surgical History:  Procedure Laterality Date  . AV FISTULA PLACEMENT Left 12/26/2017   Procedure: LEFT BRACHIOCEPHALIC ARTERIOVENOUS (AV) FISTULA CREATION;  Surgeon: Conrad Beatrice, MD;  Location: [place];  Service: Vascular;  Laterality: Left;  . BASCILIC VEIN TRANSPOSITION Left 02/16/2018   Procedure: BRACHIOBASILIC VEIN TRANSPOSITION SECOND STAGE;  Surgeon: Conrad [place], MD;  Location: [place] [facility];  Service: Vascular;  Laterality: Left;  . CATARACT EXTRACTION W/ INTRAOCULAR LENS  IMPLANT, BILATERAL Bilateral   . ESOPHAGEAL BANDING N/A 04/01/2018   Procedure: ESOPHAGEAL BANDING;  Surgeon: Otis Brace, MD;  [facility]: [facility];  Service: Gastroenterology;  Laterality: N/A;  . ESOPHAGEAL BANDING N/A 06/29/2018   Procedure: ESOPHAGEAL BANDING;  Surgeon: Otis Brace, MD;  Location: [facility];  Service: Gastroenterology;  Laterality: N/A;  . ESOPHAGOGASTRODUODENOSCOPY (EGD) WITH PROPOFOL N/A 04/01/2018   Procedure: ESOPHAGOGASTRODUODENOSCOPY (EGD) WITH PROPOFOL;  Surgeon: Otis Brace, MD;  [facility];  Service: Gastroenterology;  Laterality: N/A;  . ESOPHAGOGASTRODUODENOSCOPY (EGD) WITH PROPOFOL N/A 06/29/2018   Procedure: ESOPHAGOGASTRODUODENOSCOPY (EGD) WITH PROPOFOL;  Surgeon: Otis Brace,  MD;  [facility]: [facility];  Service: Gastroenterology;  Laterality: N/A;  . ESOPHAGOGASTRODUODENOSCOPY (EGD) WITH PROPOFOL N/A 09/30/2018   Procedure: ESOPHAGOGASTRODUODENOSCOPY (EGD) WITH PROPOFOL;  Surgeon: Otis Brace, MD;  [facility]: [facility];  Service: Gastroenterology;  Laterality: N/A;  . ESOPHAGOGASTRODUODENOSCOPY (EGD) WITH PROPOFOL N/A 04/20/2019   Procedure: ESOPHAGOGASTRODUODENOSCOPY (EGD) WITH PROPOFOL;  Surgeon: Otis Brace, MD;  [facility]: [facility];  Service: Gastroenterology;  Laterality: N/A;  . I & D EXTREMITY Left 02/18/2018   Procedure: IRRIGATION AND DEBRIDEMENT LEFT ARM;  Surgeon: Serafina Mitchell, MD;  [facility];  Service: Vascular;  Laterality: Left;  . INSERTION OF DIALYSIS CATHETER N/A 01/19/2018   Procedure: INSERTION OF DIALYSIS CATHETER - RIGHT INTERNAL JUGULAR PLACEMENT;  Surgeon: Rosetta Posner, MD;  Location: [place];  Service: Vascular;  Laterality: N/A;  . IR FLUORO GUIDE CV LINE RIGHT  11/10/2017  . IR PARACENTESIS  12/10/2017  . IR PARACENTESIS  12/26/2017  . IR REMOVAL TUN CV CATH W/O FL  11/28/2017  . IR THROMBECTOMY AV FISTULA W/THROMBOLYSIS/PTA INC/SHUNT/IMG LEFT Left 06/02/2019  . IR US GUIDE VASC ACCESS LEFT  06/02/2019  . IR US GUIDE VASC ACCESS RIGHT  11/10/2017  . NECK SURGERY  2007   "back of neck; no hardware in there"       Home Medications    Prior to Admission medications   Medication Sig Start Date End Date Taking? Authorizing Provider  acetaminophen (TYLENOL) 650 MG CR tablet Take 650 mg by mouth every 8 (eight) hours as needed for pain.    [provider]  amLODipine (NORVASC) 10 MG tablet Take 1 tablet (10 mg total) by mouth daily. Patient taking differently: Take 5 mg by mouth daily.  04/21/18 09/25/19  Horald Pollen, MD  calcium acetate (PHOSLO) 667 MG capsule Take 2 capsules (1,334 mg total) by mouth 3 (three) times daily with meals. Patient taking differently: Take 667 mg by mouth 3 (three) times daily  with meals.  01/26/18   Purohit, Konrad Dolores, MD  carvedilol (COREG) 3.125 MG tablet Take 1 tablet (3.125 mg total) by mouth 2 (two) times daily. 05/03/19 08/01/19  Horald Pollen, MD  cetirizine (ZYRTEC) 5 MG tablet Take 1 tablet (5 mg total) by mouth 3 (three) times a week. Before dialysis 07/12/19   Rutherford Guys, MD  ferrous sulfate 325 (65 FE) MG tablet Take 325 mg by mouth daily with breakfast.    [provider]  glucose blood (ONETOUCH VERIO) test strip USE TO CHECK BLOOD SUGAR THREE TIMES DAILY. DX:E11.65 04/21/19   Elayne Snare, MD  insulin degludec (TRESIBA FLEXTOUCH) 100 UNIT/ML SOPN FlexTouch Pen Inject 0.14 mLs (14 Units total) into the skin daily. Inject 14 units under the skin once daily. Patient taking differently: Inject 12 Units into the skin daily.  03/02/19   Elayne Snare, MD  Insulin Pen Needle (ADVOCATE INSULIN PEN NEEDLES) 31G X 5 MM MISC Use four times daily to inject insulin 04/27/18   Elayne Snare, MD  insulin regular (NOVOLIN R,HUMULIN R) 100 units/mL injection Inject 6-10 Units into the skin See admin instructions. INJECT 8 UNITS UNDER THE SKIN AT BREAKFAST, 10  UNITS AT LUNCH AND 8 UNITS AT DINNER.    [provider]  lidocaine-prilocaine (EMLA) cream Apply 1 application topically Every Tuesday,Thursday,and Saturday with dialysis. 08/24/18   [provider]  loperamide (IMODIUM A-D) 2 MG tablet Take 1 tablet (2 mg total) by mouth 4 (four) times daily as needed for diarrhea or loose stools. 07/19/19   Maximiano Coss, NP  Multiple Vitamins-Minerals (MULTIVITAMIN WITH MINERALS) tablet Take 1 tablet by mouth daily.    [provider]  ofloxacin (FLOXIN) 0.3 % OTIC solution Place 5 drops into the right ear daily. 08/09/19   Jae Skeet C, PA-C  ondansetron (ZOFRAN ODT) 4 MG disintegrating tablet Take 1 tablet (4 mg total) by mouth every 8 (eight) hours as needed for nausea or vomiting. Dissolves in mouth 08/09/19   Taige Housman, [place] C, PA-C  ONE  TOUCH LANCETS MISC Use to test blood sugar three times daily 04/27/18   Elayne Snare, MD  Polyethyl Glycol-Propyl Glycol (SYSTANE) 0.4-0.3 % SOLN Place 1 drop into both eyes 3 (three) times daily as needed (for dry eyes).    [provider]  rosuvastatin (CRESTOR) 5 MG tablet Take 1 tablet (5 mg total) by mouth daily. 03/17/19   Elayne Snare, MD    Family History Family History  Problem Relation Age of Onset  . Diabetes Mellitus II Sister   . Diabetes Sister   . Stroke Brother   . Heart attack Brother     Social History Social History   Tobacco Use  . Smoking status: Never Smoker  . Smokeless tobacco: Never Used  Substance Use Topics  . Alcohol use: Never  . Drug use: Never     Allergies   Heparin and Pork-derived products   Review of Systems Review of Systems  Constitutional: Negative for activity change, appetite change, chills, fatigue and fever.  HENT: Positive for ear pain. Negative for congestion, rhinorrhea, sinus pressure, sore throat and trouble swallowing.   Eyes: Negative for discharge and redness.  Respiratory: Positive for cough. Negative for chest tightness and shortness of breath.   Cardiovascular: Negative for chest pain.  Gastrointestinal: Positive for nausea. Negative for abdominal pain, diarrhea and vomiting.  Musculoskeletal: Negative for myalgias.  Skin: Negative for rash.  Neurological: Negative for dizziness, light-headedness and headaches.     Physical Exam Triage Vital Signs ED Triage Vitals  Enc Vitals Group     BP 08/09/19 0954 101/61     Pulse Rate 08/09/19 0954 79     Resp 08/09/19 0954 18     Temp 08/09/19 0954 99.1 F (37.3 C)     Temp Source 08/09/19 0954 Oral     SpO2 08/09/19 0954 97 %     Weight --      Height --      Head Circumference --      Peak Flow --      Pain Score 08/09/19 0952 0     Pain Loc --      Pain Edu? --      Excl. in [place]? --    No data found.  Updated Vital Signs BP 101/61 (BP Location: Right  Arm)   Pulse 79   Temp 99.1 F (37.3 C) (Oral)   Resp 18   SpO2 97%   Visual Acuity Right Eye Distance:   Left Eye Distance:   Bilateral Distance:    Right Eye Near:   Left Eye Near:    Bilateral Near:     Physical Exam Vitals  and nursing note reviewed.  Constitutional:      Appearance: He is well-developed.     Comments: No acute distress  HENT:     Head: Normocephalic and atraumatic.     Ears:     Comments: Right TM with perforation, TM does not appear erythematous, canal without swelling or erythema, more lateral canal does appear to have some discharge versus cerumen, tenderness to palpation over tragus    Nose: Nose normal.     Mouth/Throat:     Comments: Oral mucosa pink and moist, no tonsillar enlargement or exudate. Posterior pharynx patent and nonerythematous, no uvula deviation or swelling. Normal phonation.  Eyes:     Conjunctiva/sclera: Conjunctivae normal.  Cardiovascular:     Rate and Rhythm: Normal rate.  Pulmonary:     Effort: Pulmonary effort is normal. No respiratory distress.     Breath sounds: Normal breath sounds.     Comments: Breathing comfortably at rest, CTABL, no wheezing, rales or other adventitious sounds auscultated Abdominal:     General: There is no distension.     Comments: Soft, nondistended, nontender to light deep palpation throughout entire abdomen  Musculoskeletal:        General: Normal range of motion.     Cervical back: Neck supple.  Skin:    General: Skin is warm and dry.  Neurological:     Mental Status: He is alert and oriented to person, place, and time.      UC Treatments / Results  Labs (all labs ordered are listed, but only abnormal results are displayed) Labs Reviewed  NOVEL CORONAVIRUS, NAA (HOSP ORDER, SEND-OUT TO REF LAB; TAT 18-24 HRS)    EKG   Radiology No results found.  Procedures Procedures (including critical care time)  Medications Ordered in UC Medications - No data to display  Initial  Impression / Assessment and Plan / UC Course  I have reviewed the triage vital signs and the nursing notes.  Pertinent labs & imaging results that were available during my care of the patient were reviewed by me and considered in my medical decision making (see chart for details).     Covid swab pending.  No abdominal tenderness, do not suspect abdominal emergency.  Recommended Zofran to use as needed for nausea to increase oral intake.  Ear exam with persistent perforation, no obvious otitis externa, but will repeat course of ofloxacin given pain has returned.  Follow-up with ENT as planned.  Continue to monitor, follow-up if symptoms progressing or worsening.  Discussed strict return precautions. Patient verbalized understanding and is agreeable with plan.  Final Clinical Impressions(s) / UC Diagnoses   Final diagnoses:  Nausea without vomiting  Right ear pain     Discharge Instructions     Covid swab pending, monitor my chart for results Use Zofran, dissolves under tongue, as needed for nausea/vomiting every 8 hours as needed Please use ofloxacin eardrops daily, follow-up with ear nose and throat Please contact your primary care to check on referral  Please follow-up if developing abdominal pain, fevers, worsening ear symptoms   ED Prescriptions    Medication Sig Dispense Auth. Provider   ofloxacin (FLOXIN) 0.3 % OTIC solution Place 5 drops into the right ear daily. 5 mL Ecko Beasley C, PA-C   ondansetron (ZOFRAN ODT) 4 MG disintegrating tablet Take 1 tablet (4 mg total) by mouth every 8 (eight) hours as needed for nausea or vomiting. Dissolves in mouth 20 tablet Joselito Fieldhouse, [place] C, PA-C  PDMP not reviewed this encounter.   Janith Lima, PA-C 08/09/19 1035

## 2019-08-12 LAB — NOVEL CORONAVIRUS, NAA (HOSP ORDER, SEND-OUT TO REF LAB; TAT 18-24 HRS): SARS-CoV-2, NAA: NOT DETECTED

## 2019-08-17 DIAGNOSIS — R972 Elevated prostate specific antigen [PSA]: Secondary | ICD-10-CM | POA: Diagnosis not present

## 2019-08-17 DIAGNOSIS — K7460 Unspecified cirrhosis of liver: Secondary | ICD-10-CM | POA: Diagnosis not present

## 2019-08-17 DIAGNOSIS — D72822 Plasmacytosis: Secondary | ICD-10-CM | POA: Diagnosis not present

## 2019-08-17 DIAGNOSIS — R188 Other ascites: Secondary | ICD-10-CM | POA: Diagnosis not present

## 2019-08-17 DIAGNOSIS — Z01818 Encounter for other preprocedural examination: Secondary | ICD-10-CM | POA: Diagnosis not present

## 2019-08-17 DIAGNOSIS — M810 Age-related osteoporosis without current pathological fracture: Secondary | ICD-10-CM | POA: Diagnosis not present

## 2019-08-20 DIAGNOSIS — D496 Neoplasm of unspecified behavior of brain: Secondary | ICD-10-CM | POA: Diagnosis not present

## 2019-08-20 DIAGNOSIS — N186 End stage renal disease: Secondary | ICD-10-CM | POA: Diagnosis not present

## 2019-08-20 DIAGNOSIS — D631 Anemia in chronic kidney disease: Secondary | ICD-10-CM | POA: Diagnosis not present

## 2019-08-20 DIAGNOSIS — Z992 Dependence on renal dialysis: Secondary | ICD-10-CM | POA: Diagnosis not present

## 2019-08-20 DIAGNOSIS — I132 Hypertensive heart and chronic kidney disease with heart failure and with stage 5 chronic kidney disease, or end stage renal disease: Secondary | ICD-10-CM | POA: Diagnosis not present

## 2019-08-20 DIAGNOSIS — D696 Thrombocytopenia, unspecified: Secondary | ICD-10-CM | POA: Diagnosis not present

## 2019-08-20 DIAGNOSIS — K029 Dental caries, unspecified: Secondary | ICD-10-CM | POA: Diagnosis not present

## 2019-08-20 DIAGNOSIS — K056 Periodontal disease, unspecified: Secondary | ICD-10-CM | POA: Diagnosis not present

## 2019-08-20 DIAGNOSIS — K760 Fatty (change of) liver, not elsewhere classified: Secondary | ICD-10-CM | POA: Diagnosis not present

## 2019-08-20 DIAGNOSIS — I8500 Esophageal varices without bleeding: Secondary | ICD-10-CM | POA: Diagnosis not present

## 2019-08-20 DIAGNOSIS — K7460 Unspecified cirrhosis of liver: Secondary | ICD-10-CM | POA: Diagnosis not present

## 2019-08-20 DIAGNOSIS — R188 Other ascites: Secondary | ICD-10-CM | POA: Diagnosis not present

## 2019-08-20 DIAGNOSIS — Z79899 Other long term (current) drug therapy: Secondary | ICD-10-CM | POA: Diagnosis not present

## 2019-08-20 DIAGNOSIS — M310 Hypersensitivity angiitis: Secondary | ICD-10-CM | POA: Diagnosis not present

## 2019-08-20 DIAGNOSIS — E1122 Type 2 diabetes mellitus with diabetic chronic kidney disease: Secondary | ICD-10-CM | POA: Diagnosis not present

## 2019-08-20 DIAGNOSIS — Z8616 Personal history of COVID-19: Secondary | ICD-10-CM | POA: Diagnosis not present

## 2019-08-20 DIAGNOSIS — K7290 Hepatic failure, unspecified without coma: Secondary | ICD-10-CM | POA: Diagnosis not present

## 2019-08-20 DIAGNOSIS — K219 Gastro-esophageal reflux disease without esophagitis: Secondary | ICD-10-CM | POA: Diagnosis not present

## 2019-08-20 DIAGNOSIS — Z794 Long term (current) use of insulin: Secondary | ICD-10-CM | POA: Diagnosis not present

## 2019-08-20 DIAGNOSIS — I509 Heart failure, unspecified: Secondary | ICD-10-CM | POA: Diagnosis not present

## 2019-08-25 DIAGNOSIS — H6611 Chronic tubotympanic suppurative otitis media, right ear: Secondary | ICD-10-CM | POA: Diagnosis not present

## 2019-08-26 DIAGNOSIS — E1129 Type 2 diabetes mellitus with other diabetic kidney complication: Secondary | ICD-10-CM | POA: Diagnosis not present

## 2019-09-01 DIAGNOSIS — B369 Superficial mycosis, unspecified: Secondary | ICD-10-CM | POA: Diagnosis not present

## 2019-09-01 DIAGNOSIS — H6240 Otitis externa in other diseases classified elsewhere, unspecified ear: Secondary | ICD-10-CM | POA: Diagnosis not present

## 2019-09-01 DIAGNOSIS — H6611 Chronic tubotympanic suppurative otitis media, right ear: Secondary | ICD-10-CM | POA: Diagnosis not present

## 2019-09-03 ENCOUNTER — Telehealth: Payer: Self-pay | Admitting: Emergency Medicine

## 2019-09-03 NOTE — Telephone Encounter (Signed)
Dr. Erik Obey would to know if pt can take difllucan due to his liver failure. Please advise  at [phone].

## 2019-09-03 NOTE — Telephone Encounter (Signed)
Please advise 

## 2019-09-06 DIAGNOSIS — E1122 Type 2 diabetes mellitus with diabetic chronic kidney disease: Secondary | ICD-10-CM | POA: Diagnosis not present

## 2019-09-06 DIAGNOSIS — Z992 Dependence on renal dialysis: Secondary | ICD-10-CM | POA: Diagnosis not present

## 2019-09-06 DIAGNOSIS — N186 End stage renal disease: Secondary | ICD-10-CM | POA: Diagnosis not present

## 2019-09-07 DIAGNOSIS — N186 End stage renal disease: Secondary | ICD-10-CM | POA: Diagnosis not present

## 2019-09-07 DIAGNOSIS — D631 Anemia in chronic kidney disease: Secondary | ICD-10-CM | POA: Diagnosis not present

## 2019-09-07 DIAGNOSIS — Z992 Dependence on renal dialysis: Secondary | ICD-10-CM | POA: Diagnosis not present

## 2019-09-07 DIAGNOSIS — N2581 Secondary hyperparathyroidism of renal origin: Secondary | ICD-10-CM | POA: Diagnosis not present

## 2019-09-07 DIAGNOSIS — D509 Iron deficiency anemia, unspecified: Secondary | ICD-10-CM | POA: Diagnosis not present

## 2019-09-22 DIAGNOSIS — K7460 Unspecified cirrhosis of liver: Secondary | ICD-10-CM | POA: Diagnosis not present

## 2019-09-22 DIAGNOSIS — E1121 Type 2 diabetes mellitus with diabetic nephropathy: Secondary | ICD-10-CM | POA: Diagnosis not present

## 2019-09-22 DIAGNOSIS — Z4822 Encounter for aftercare following kidney transplant: Secondary | ICD-10-CM | POA: Diagnosis not present

## 2019-09-22 DIAGNOSIS — Z01810 Encounter for preprocedural cardiovascular examination: Secondary | ICD-10-CM | POA: Diagnosis not present

## 2019-09-22 DIAGNOSIS — K117 Disturbances of salivary secretion: Secondary | ICD-10-CM | POA: Diagnosis not present

## 2019-09-22 DIAGNOSIS — Z1211 Encounter for screening for malignant neoplasm of colon: Secondary | ICD-10-CM | POA: Diagnosis not present

## 2019-09-22 DIAGNOSIS — R161 Splenomegaly, not elsewhere classified: Secondary | ICD-10-CM | POA: Diagnosis not present

## 2019-09-22 DIAGNOSIS — M310 Hypersensitivity angiitis: Secondary | ICD-10-CM | POA: Diagnosis not present

## 2019-09-22 DIAGNOSIS — Z992 Dependence on renal dialysis: Secondary | ICD-10-CM | POA: Diagnosis not present

## 2019-09-22 DIAGNOSIS — Z125 Encounter for screening for malignant neoplasm of prostate: Secondary | ICD-10-CM | POA: Diagnosis not present

## 2019-09-22 DIAGNOSIS — R933 Abnormal findings on diagnostic imaging of other parts of digestive tract: Secondary | ICD-10-CM | POA: Diagnosis not present

## 2019-09-22 DIAGNOSIS — Z1159 Encounter for screening for other viral diseases: Secondary | ICD-10-CM | POA: Diagnosis not present

## 2019-09-22 DIAGNOSIS — N186 End stage renal disease: Secondary | ICD-10-CM | POA: Diagnosis not present

## 2019-09-22 DIAGNOSIS — E1122 Type 2 diabetes mellitus with diabetic chronic kidney disease: Secondary | ICD-10-CM | POA: Diagnosis not present

## 2019-09-22 DIAGNOSIS — Z01818 Encounter for other preprocedural examination: Secondary | ICD-10-CM | POA: Diagnosis not present

## 2019-09-22 DIAGNOSIS — E11319 Type 2 diabetes mellitus with unspecified diabetic retinopathy without macular edema: Secondary | ICD-10-CM | POA: Diagnosis not present

## 2019-09-22 DIAGNOSIS — I8500 Esophageal varices without bleeding: Secondary | ICD-10-CM | POA: Diagnosis not present

## 2019-09-22 DIAGNOSIS — R972 Elevated prostate specific antigen [PSA]: Secondary | ICD-10-CM | POA: Diagnosis not present

## 2019-09-22 DIAGNOSIS — I129 Hypertensive chronic kidney disease with stage 1 through stage 4 chronic kidney disease, or unspecified chronic kidney disease: Secondary | ICD-10-CM | POA: Diagnosis not present

## 2019-09-22 DIAGNOSIS — Z794 Long term (current) use of insulin: Secondary | ICD-10-CM | POA: Diagnosis not present

## 2019-09-22 DIAGNOSIS — Z4823 Encounter for aftercare following liver transplant: Secondary | ICD-10-CM | POA: Diagnosis not present

## 2019-09-22 DIAGNOSIS — Z113 Encounter for screening for infections with a predominantly sexual mode of transmission: Secondary | ICD-10-CM | POA: Diagnosis not present

## 2019-09-22 DIAGNOSIS — K7469 Other cirrhosis of liver: Secondary | ICD-10-CM | POA: Diagnosis not present

## 2019-09-24 DIAGNOSIS — H6611 Chronic tubotympanic suppurative otitis media, right ear: Secondary | ICD-10-CM | POA: Diagnosis not present

## 2019-09-24 DIAGNOSIS — H90A22 Sensorineural hearing loss, unilateral, left ear, with restricted hearing on the contralateral side: Secondary | ICD-10-CM | POA: Diagnosis not present

## 2019-09-24 DIAGNOSIS — H9113 Presbycusis, bilateral: Secondary | ICD-10-CM | POA: Insufficient documentation

## 2019-09-24 DIAGNOSIS — H90A31 Mixed conductive and sensorineural hearing loss, unilateral, right ear with restricted hearing on the contralateral side: Secondary | ICD-10-CM | POA: Diagnosis not present

## 2019-09-27 ENCOUNTER — Other Ambulatory Visit: Payer: Self-pay | Admitting: Gastroenterology

## 2019-09-27 ENCOUNTER — Telehealth: Payer: Self-pay | Admitting: *Deleted

## 2019-09-27 DIAGNOSIS — K7460 Unspecified cirrhosis of liver: Secondary | ICD-10-CM

## 2019-09-27 NOTE — Telephone Encounter (Signed)
Faxed Audiology Medical clearance for hearing aid use to [facility]. Confirmation page 12:32 pm.

## 2019-10-01 DIAGNOSIS — I871 Compression of vein: Secondary | ICD-10-CM | POA: Diagnosis not present

## 2019-10-01 DIAGNOSIS — N186 End stage renal disease: Secondary | ICD-10-CM | POA: Diagnosis not present

## 2019-10-01 DIAGNOSIS — T82858A Stenosis of vascular prosthetic devices, implants and grafts, initial encounter: Secondary | ICD-10-CM | POA: Diagnosis not present

## 2019-10-01 DIAGNOSIS — Z992 Dependence on renal dialysis: Secondary | ICD-10-CM | POA: Diagnosis not present

## 2019-10-04 DIAGNOSIS — E1122 Type 2 diabetes mellitus with diabetic chronic kidney disease: Secondary | ICD-10-CM | POA: Diagnosis not present

## 2019-10-04 DIAGNOSIS — Z992 Dependence on renal dialysis: Secondary | ICD-10-CM | POA: Diagnosis not present

## 2019-10-04 DIAGNOSIS — N186 End stage renal disease: Secondary | ICD-10-CM | POA: Diagnosis not present

## 2019-10-05 DIAGNOSIS — N2581 Secondary hyperparathyroidism of renal origin: Secondary | ICD-10-CM | POA: Diagnosis not present

## 2019-10-05 DIAGNOSIS — Z992 Dependence on renal dialysis: Secondary | ICD-10-CM | POA: Diagnosis not present

## 2019-10-05 DIAGNOSIS — D509 Iron deficiency anemia, unspecified: Secondary | ICD-10-CM | POA: Diagnosis not present

## 2019-10-05 DIAGNOSIS — D631 Anemia in chronic kidney disease: Secondary | ICD-10-CM | POA: Diagnosis not present

## 2019-10-05 DIAGNOSIS — N186 End stage renal disease: Secondary | ICD-10-CM | POA: Diagnosis not present

## 2019-10-13 ENCOUNTER — Ambulatory Visit
Admission: RE | Admit: 2019-10-13 | Discharge: 2019-10-13 | Disposition: A | Discharge status: Home / Self Care | Payer: Medicare Other | Source: Ambulatory Visit | Attending: Gastroenterology | Admitting: Gastroenterology

## 2019-10-13 DIAGNOSIS — K7460 Unspecified cirrhosis of liver: Secondary | ICD-10-CM

## 2019-10-13 DIAGNOSIS — K7689 Other specified diseases of liver: Secondary | ICD-10-CM | POA: Diagnosis not present

## 2019-10-13 DIAGNOSIS — K828 Other specified diseases of gallbladder: Secondary | ICD-10-CM | POA: Diagnosis not present

## 2019-10-15 DIAGNOSIS — K7469 Other cirrhosis of liver: Secondary | ICD-10-CM | POA: Diagnosis not present

## 2019-10-15 DIAGNOSIS — Z7901 Long term (current) use of anticoagulants: Secondary | ICD-10-CM | POA: Diagnosis not present

## 2019-10-15 DIAGNOSIS — Z01818 Encounter for other preprocedural examination: Secondary | ICD-10-CM | POA: Diagnosis not present

## 2019-10-15 DIAGNOSIS — Z7682 Awaiting organ transplant status: Secondary | ICD-10-CM | POA: Diagnosis not present

## 2019-10-20 ENCOUNTER — Ambulatory Visit
Admission: RE | Admit: 2019-10-20 | Discharge: 2019-10-20 | Disposition: A | Discharge status: Home / Self Care | Payer: Medicare Other | Source: Ambulatory Visit | Attending: Gastroenterology | Admitting: Gastroenterology

## 2019-10-20 ENCOUNTER — Other Ambulatory Visit: Payer: Self-pay | Admitting: Gastroenterology

## 2019-10-20 DIAGNOSIS — I509 Heart failure, unspecified: Secondary | ICD-10-CM

## 2019-10-20 DIAGNOSIS — R05 Cough: Secondary | ICD-10-CM | POA: Diagnosis not present

## 2019-10-25 DIAGNOSIS — K7469 Other cirrhosis of liver: Secondary | ICD-10-CM | POA: Diagnosis not present

## 2019-10-25 DIAGNOSIS — Z7682 Awaiting organ transplant status: Secondary | ICD-10-CM | POA: Diagnosis not present

## 2019-10-25 DIAGNOSIS — Z01818 Encounter for other preprocedural examination: Secondary | ICD-10-CM | POA: Diagnosis not present

## 2019-11-04 ENCOUNTER — Other Ambulatory Visit: Payer: Self-pay | Admitting: Endocrinology

## 2019-11-04 DIAGNOSIS — N2581 Secondary hyperparathyroidism of renal origin: Secondary | ICD-10-CM | POA: Diagnosis not present

## 2019-11-04 DIAGNOSIS — N186 End stage renal disease: Secondary | ICD-10-CM | POA: Diagnosis not present

## 2019-11-04 DIAGNOSIS — E1122 Type 2 diabetes mellitus with diabetic chronic kidney disease: Secondary | ICD-10-CM | POA: Diagnosis not present

## 2019-11-04 DIAGNOSIS — Z23 Encounter for immunization: Secondary | ICD-10-CM | POA: Diagnosis not present

## 2019-11-04 DIAGNOSIS — D631 Anemia in chronic kidney disease: Secondary | ICD-10-CM | POA: Diagnosis not present

## 2019-11-04 DIAGNOSIS — Z992 Dependence on renal dialysis: Secondary | ICD-10-CM | POA: Diagnosis not present

## 2019-11-04 DIAGNOSIS — D509 Iron deficiency anemia, unspecified: Secondary | ICD-10-CM | POA: Diagnosis not present

## 2019-11-08 ENCOUNTER — Other Ambulatory Visit: Payer: Self-pay

## 2019-11-08 MED ORDER — ONETOUCH VERIO VI STRP: ORAL_STRIP | 0 refills | Status: DC

## 2019-11-10 DIAGNOSIS — N186 End stage renal disease: Secondary | ICD-10-CM | POA: Diagnosis not present

## 2019-11-10 DIAGNOSIS — Z992 Dependence on renal dialysis: Secondary | ICD-10-CM | POA: Diagnosis not present

## 2019-11-10 DIAGNOSIS — I871 Compression of vein: Secondary | ICD-10-CM | POA: Diagnosis not present

## 2019-11-22 DIAGNOSIS — N186 End stage renal disease: Secondary | ICD-10-CM | POA: Diagnosis not present

## 2019-11-22 DIAGNOSIS — T82868A Thrombosis of vascular prosthetic devices, implants and grafts, initial encounter: Secondary | ICD-10-CM | POA: Diagnosis not present

## 2019-11-22 DIAGNOSIS — I871 Compression of vein: Secondary | ICD-10-CM | POA: Diagnosis not present

## 2019-11-22 DIAGNOSIS — Z992 Dependence on renal dialysis: Secondary | ICD-10-CM | POA: Diagnosis not present

## 2019-11-25 DIAGNOSIS — E1129 Type 2 diabetes mellitus with other diabetic kidney complication: Secondary | ICD-10-CM | POA: Diagnosis not present

## 2019-11-29 DIAGNOSIS — Z7901 Long term (current) use of anticoagulants: Secondary | ICD-10-CM | POA: Diagnosis not present

## 2019-11-29 DIAGNOSIS — K7469 Other cirrhosis of liver: Secondary | ICD-10-CM | POA: Diagnosis not present

## 2019-11-29 DIAGNOSIS — Z01818 Encounter for other preprocedural examination: Secondary | ICD-10-CM | POA: Diagnosis not present

## 2019-11-29 DIAGNOSIS — Z7682 Awaiting organ transplant status: Secondary | ICD-10-CM | POA: Diagnosis not present

## 2019-12-04 DIAGNOSIS — N2581 Secondary hyperparathyroidism of renal origin: Secondary | ICD-10-CM | POA: Diagnosis not present

## 2019-12-04 DIAGNOSIS — D631 Anemia in chronic kidney disease: Secondary | ICD-10-CM | POA: Diagnosis not present

## 2019-12-04 DIAGNOSIS — D509 Iron deficiency anemia, unspecified: Secondary | ICD-10-CM | POA: Diagnosis not present

## 2019-12-04 DIAGNOSIS — Z23 Encounter for immunization: Secondary | ICD-10-CM | POA: Diagnosis not present

## 2019-12-04 DIAGNOSIS — Z992 Dependence on renal dialysis: Secondary | ICD-10-CM | POA: Diagnosis not present

## 2019-12-04 DIAGNOSIS — N186 End stage renal disease: Secondary | ICD-10-CM | POA: Diagnosis not present

## 2019-12-04 DIAGNOSIS — E1122 Type 2 diabetes mellitus with diabetic chronic kidney disease: Secondary | ICD-10-CM | POA: Diagnosis not present

## 2019-12-27 DIAGNOSIS — Z7901 Long term (current) use of anticoagulants: Secondary | ICD-10-CM | POA: Diagnosis not present

## 2019-12-27 DIAGNOSIS — K7469 Other cirrhosis of liver: Secondary | ICD-10-CM | POA: Diagnosis not present

## 2019-12-27 DIAGNOSIS — Z01818 Encounter for other preprocedural examination: Secondary | ICD-10-CM | POA: Diagnosis not present

## 2019-12-27 DIAGNOSIS — Z7682 Awaiting organ transplant status: Secondary | ICD-10-CM | POA: Diagnosis not present

## 2020-01-01 ENCOUNTER — Other Ambulatory Visit: Payer: Self-pay | Admitting: Endocrinology

## 2020-01-04 DIAGNOSIS — N186 End stage renal disease: Secondary | ICD-10-CM | POA: Diagnosis not present

## 2020-01-04 DIAGNOSIS — D509 Iron deficiency anemia, unspecified: Secondary | ICD-10-CM | POA: Diagnosis not present

## 2020-01-04 DIAGNOSIS — Z992 Dependence on renal dialysis: Secondary | ICD-10-CM | POA: Diagnosis not present

## 2020-01-04 DIAGNOSIS — E1122 Type 2 diabetes mellitus with diabetic chronic kidney disease: Secondary | ICD-10-CM | POA: Diagnosis not present

## 2020-01-04 DIAGNOSIS — N2581 Secondary hyperparathyroidism of renal origin: Secondary | ICD-10-CM | POA: Diagnosis not present

## 2020-01-04 DIAGNOSIS — D631 Anemia in chronic kidney disease: Secondary | ICD-10-CM | POA: Diagnosis not present

## 2020-01-07 ENCOUNTER — Other Ambulatory Visit: Payer: Self-pay

## 2020-01-07 MED ORDER — CETIRIZINE HCL 5 MG PO TABS: 5.0000 mg | ORAL_TABLET | ORAL | 0 refills | Status: DC

## 2020-01-07 NOTE — Addendum Note (Signed)
Addended by: Rutherford Guys on: 01/07/2020 02:27 PM   Modules accepted: Orders

## 2020-01-07 NOTE — Telephone Encounter (Signed)
Patient is requesting a refill of the following medications: Requested Prescriptions   Pending Prescriptions Disp Refills  . cetirizine (ZYRTEC) 5 MG tablet 90 tablet 0    Sig: Take 1 tablet (5 mg total) by mouth 3 (three) times a week. Before dialysis    Date of patient request: 01/07/2020 Last office visit: 07/12/2019 Date of last refill: 07/12/2019 Last refill amount: 90 tablets Follow up time period per chart: not specified

## 2020-01-27 ENCOUNTER — Other Ambulatory Visit: Payer: Self-pay | Admitting: Endocrinology

## 2020-01-31 ENCOUNTER — Other Ambulatory Visit: Payer: Self-pay

## 2020-01-31 MED ORDER — ONETOUCH VERIO VI STRP: ORAL_STRIP | 0 refills | Status: DC

## 2020-02-03 DIAGNOSIS — Z992 Dependence on renal dialysis: Secondary | ICD-10-CM | POA: Diagnosis not present

## 2020-02-03 DIAGNOSIS — N2581 Secondary hyperparathyroidism of renal origin: Secondary | ICD-10-CM | POA: Diagnosis not present

## 2020-02-03 DIAGNOSIS — D631 Anemia in chronic kidney disease: Secondary | ICD-10-CM | POA: Diagnosis not present

## 2020-02-03 DIAGNOSIS — N186 End stage renal disease: Secondary | ICD-10-CM | POA: Diagnosis not present

## 2020-02-03 DIAGNOSIS — D509 Iron deficiency anemia, unspecified: Secondary | ICD-10-CM | POA: Diagnosis not present

## 2020-02-03 DIAGNOSIS — E1122 Type 2 diabetes mellitus with diabetic chronic kidney disease: Secondary | ICD-10-CM | POA: Diagnosis not present

## 2020-02-09 DIAGNOSIS — K7469 Other cirrhosis of liver: Secondary | ICD-10-CM | POA: Diagnosis not present

## 2020-02-09 DIAGNOSIS — Z7901 Long term (current) use of anticoagulants: Secondary | ICD-10-CM | POA: Diagnosis not present

## 2020-02-09 DIAGNOSIS — Z01818 Encounter for other preprocedural examination: Secondary | ICD-10-CM | POA: Diagnosis not present

## 2020-02-09 DIAGNOSIS — Z7682 Awaiting organ transplant status: Secondary | ICD-10-CM | POA: Diagnosis not present

## 2020-02-24 DIAGNOSIS — E1129 Type 2 diabetes mellitus with other diabetic kidney complication: Secondary | ICD-10-CM | POA: Diagnosis not present

## 2020-03-05 DIAGNOSIS — N186 End stage renal disease: Secondary | ICD-10-CM | POA: Diagnosis not present

## 2020-03-05 DIAGNOSIS — Z992 Dependence on renal dialysis: Secondary | ICD-10-CM | POA: Diagnosis not present

## 2020-03-05 DIAGNOSIS — E1122 Type 2 diabetes mellitus with diabetic chronic kidney disease: Secondary | ICD-10-CM | POA: Diagnosis not present

## 2020-03-06 DIAGNOSIS — T7840XA Allergy, unspecified, initial encounter: Secondary | ICD-10-CM | POA: Insufficient documentation

## 2020-03-06 DIAGNOSIS — T782XXA Anaphylactic shock, unspecified, initial encounter: Secondary | ICD-10-CM | POA: Insufficient documentation

## 2020-03-07 DIAGNOSIS — D509 Iron deficiency anemia, unspecified: Secondary | ICD-10-CM | POA: Diagnosis not present

## 2020-03-07 DIAGNOSIS — N2581 Secondary hyperparathyroidism of renal origin: Secondary | ICD-10-CM | POA: Diagnosis not present

## 2020-03-07 DIAGNOSIS — N186 End stage renal disease: Secondary | ICD-10-CM | POA: Diagnosis not present

## 2020-03-07 DIAGNOSIS — D631 Anemia in chronic kidney disease: Secondary | ICD-10-CM | POA: Diagnosis not present

## 2020-03-07 DIAGNOSIS — Z992 Dependence on renal dialysis: Secondary | ICD-10-CM | POA: Diagnosis not present

## 2020-03-29 DIAGNOSIS — I81 Portal vein thrombosis: Secondary | ICD-10-CM | POA: Diagnosis not present

## 2020-03-29 DIAGNOSIS — E119 Type 2 diabetes mellitus without complications: Secondary | ICD-10-CM | POA: Diagnosis not present

## 2020-03-29 DIAGNOSIS — K7460 Unspecified cirrhosis of liver: Secondary | ICD-10-CM | POA: Diagnosis not present

## 2020-03-29 DIAGNOSIS — R188 Other ascites: Secondary | ICD-10-CM | POA: Diagnosis not present

## 2020-03-30 ENCOUNTER — Telehealth: Payer: Self-pay | Admitting: Emergency Medicine

## 2020-03-30 ENCOUNTER — Telehealth: Payer: Self-pay | Admitting: *Deleted

## 2020-03-30 NOTE — Telephone Encounter (Signed)
Transplant coordinator Altha Harm is calling from [facility] to discuss this mutual patient .  Please reach out ti Altha Harm at [phone]   asap

## 2020-03-30 NOTE — Telephone Encounter (Signed)
Returned call to Gurney Maxin, Therapist, sports at [facility], left message in voice mail.

## 2020-03-31 NOTE — Telephone Encounter (Signed)
I have called Christine back at [facility] and received an update.   This is an update from [facility] for the liver Transplant.  ~ Pt currently has another foot wound and now as an appointment with DR. Wagnor on Monday. ~ They did preliminary CT and found a clot near belly so they want to start pt on anticoagulant (Coumadin) but wanted [place] to mange it. I have informed her that we are not able to do that so she will work on it.  ~ There was a bone density test done on pt and they need [place] to put in the ambulatory referral to Endocrinology to mange osteoporosis so once we get those results we can possible refer the pt out. Referral has been pended.   Please advise if you have any additional questions.

## 2020-04-03 ENCOUNTER — Ambulatory Visit (INDEPENDENT_AMBULATORY_CARE_PROVIDER_SITE_OTHER): Payer: Medicare Other | Admitting: Podiatry

## 2020-04-03 ENCOUNTER — Ambulatory Visit: Payer: Medicare Other

## 2020-04-03 ENCOUNTER — Ambulatory Visit (INDEPENDENT_AMBULATORY_CARE_PROVIDER_SITE_OTHER): Payer: Medicare Other

## 2020-04-03 ENCOUNTER — Other Ambulatory Visit: Payer: Self-pay

## 2020-04-03 DIAGNOSIS — E1149 Type 2 diabetes mellitus with other diabetic neurological complication: Secondary | ICD-10-CM | POA: Diagnosis not present

## 2020-04-03 DIAGNOSIS — L97511 Non-pressure chronic ulcer of other part of right foot limited to breakdown of skin: Secondary | ICD-10-CM

## 2020-04-03 MED ORDER — DOXYCYCLINE HYCLATE 100 MG PO TABS: 100.0000 mg | ORAL_TABLET | Freq: Two times a day (BID) | ORAL | 0 refills | Status: DC

## 2020-04-03 MED ORDER — MUPIROCIN 2 % EX OINT: 1.0000 "application " | TOPICAL_OINTMENT | Freq: Two times a day (BID) | CUTANEOUS | 2 refills | Status: DC

## 2020-04-03 NOTE — Progress Notes (Signed)
Subjective: 69 year old male presents the office today for concerns of an ulceration to the top of his right third toe.  Also has noticed some mild swelling to the second toe.  No wound on the second toe.  There in the right third toe started as a blister.  Denies any current drainage or pus no redness or red streaks.  No recent treatment. Denies any systemic complaints such as fevers, chills, nausea, vomiting. No acute changes since last appointment, and no other complaints at this time.   Objective: AAO x3, NAD DP/PT pulses palpable bilaterally, CRT less than 3 seconds Hyperkeratotic lesion with central ulceration present to the dorsal third toe on the IPJ.  After debridement there is a granular wound base without any probing, undermining or tunneling.  No surrounding erythema, ascending cellulitis.  There is no fluctuance or crepitation but there is no malodor. No pain with calf compression, swelling, warmth, erythema      Assessment: Ulceration right third toe  Plan: -All treatment options discussed with the patient including all alternatives, risks, complications.  -X-rays obtained reviewed.  No evidence of acute fracture, foreign body, osteomyelitis or soft tissue emphysema -Today sharply debrided the wound on the right third toe without any complications utilizing the 312 with scalpel down to healthy, granular, viable tissue. -Recommend mupirocin ointment dressing changes daily.  Prescribed doxycycline.  Offloading. -Monitor for any clinical signs or symptoms of infection and directed to call the office immediately should any occur or go to the ER. -Patient encouraged to call the office with any questions, concerns, change in symptoms.   Trula Slade DPM

## 2020-04-05 DIAGNOSIS — E1122 Type 2 diabetes mellitus with diabetic chronic kidney disease: Secondary | ICD-10-CM | POA: Diagnosis not present

## 2020-04-05 DIAGNOSIS — N186 End stage renal disease: Secondary | ICD-10-CM | POA: Diagnosis not present

## 2020-04-05 DIAGNOSIS — Z992 Dependence on renal dialysis: Secondary | ICD-10-CM | POA: Diagnosis not present

## 2020-04-06 DIAGNOSIS — D509 Iron deficiency anemia, unspecified: Secondary | ICD-10-CM | POA: Diagnosis not present

## 2020-04-06 DIAGNOSIS — N186 End stage renal disease: Secondary | ICD-10-CM | POA: Diagnosis not present

## 2020-04-06 DIAGNOSIS — Z992 Dependence on renal dialysis: Secondary | ICD-10-CM | POA: Diagnosis not present

## 2020-04-06 DIAGNOSIS — N2581 Secondary hyperparathyroidism of renal origin: Secondary | ICD-10-CM | POA: Diagnosis not present

## 2020-04-06 DIAGNOSIS — D631 Anemia in chronic kidney disease: Secondary | ICD-10-CM | POA: Diagnosis not present

## 2020-04-12 DIAGNOSIS — E1122 Type 2 diabetes mellitus with diabetic chronic kidney disease: Secondary | ICD-10-CM | POA: Diagnosis not present

## 2020-04-12 DIAGNOSIS — R188 Other ascites: Secondary | ICD-10-CM | POA: Diagnosis not present

## 2020-04-12 DIAGNOSIS — T182XXA Foreign body in stomach, initial encounter: Secondary | ICD-10-CM | POA: Diagnosis not present

## 2020-04-12 DIAGNOSIS — K3189 Other diseases of stomach and duodenum: Secondary | ICD-10-CM | POA: Diagnosis not present

## 2020-04-12 DIAGNOSIS — N186 End stage renal disease: Secondary | ICD-10-CM | POA: Diagnosis not present

## 2020-04-12 DIAGNOSIS — I132 Hypertensive heart and chronic kidney disease with heart failure and with stage 5 chronic kidney disease, or end stage renal disease: Secondary | ICD-10-CM | POA: Diagnosis not present

## 2020-04-12 DIAGNOSIS — I864 Gastric varices: Secondary | ICD-10-CM | POA: Diagnosis not present

## 2020-04-12 DIAGNOSIS — I8510 Secondary esophageal varices without bleeding: Secondary | ICD-10-CM | POA: Diagnosis not present

## 2020-04-12 DIAGNOSIS — Z794 Long term (current) use of insulin: Secondary | ICD-10-CM | POA: Diagnosis not present

## 2020-04-12 DIAGNOSIS — K219 Gastro-esophageal reflux disease without esophagitis: Secondary | ICD-10-CM | POA: Diagnosis not present

## 2020-04-12 DIAGNOSIS — Z79899 Other long term (current) drug therapy: Secondary | ICD-10-CM | POA: Diagnosis not present

## 2020-04-12 DIAGNOSIS — D696 Thrombocytopenia, unspecified: Secondary | ICD-10-CM | POA: Diagnosis not present

## 2020-04-12 DIAGNOSIS — I8500 Esophageal varices without bleeding: Secondary | ICD-10-CM | POA: Diagnosis not present

## 2020-04-12 DIAGNOSIS — K7460 Unspecified cirrhosis of liver: Secondary | ICD-10-CM | POA: Diagnosis not present

## 2020-04-12 DIAGNOSIS — M310 Hypersensitivity angiitis: Secondary | ICD-10-CM | POA: Diagnosis not present

## 2020-04-12 DIAGNOSIS — K766 Portal hypertension: Secondary | ICD-10-CM | POA: Diagnosis not present

## 2020-04-12 DIAGNOSIS — I509 Heart failure, unspecified: Secondary | ICD-10-CM | POA: Diagnosis not present

## 2020-04-16 ENCOUNTER — Emergency Department (HOSPITAL_COMMUNITY): Payer: Medicare Other

## 2020-04-16 ENCOUNTER — Other Ambulatory Visit: Payer: Self-pay

## 2020-04-16 ENCOUNTER — Inpatient Hospital Stay (HOSPITAL_COMMUNITY)
Admission: EM | Admit: 2020-04-16 | Discharge: 2020-04-19 | DRG: 377 | Disposition: A | Discharge status: Other Acute Inpatient Hospital | Payer: Medicare Other | Attending: Pulmonary Disease | Admitting: Pulmonary Disease

## 2020-04-16 ENCOUNTER — Encounter (HOSPITAL_COMMUNITY): Payer: Self-pay

## 2020-04-16 DIAGNOSIS — N2581 Secondary hyperparathyroidism of renal origin: Secondary | ICD-10-CM | POA: Diagnosis not present

## 2020-04-16 DIAGNOSIS — Z86718 Personal history of other venous thrombosis and embolism: Secondary | ICD-10-CM

## 2020-04-16 DIAGNOSIS — N186 End stage renal disease: Secondary | ICD-10-CM | POA: Diagnosis present

## 2020-04-16 DIAGNOSIS — D696 Thrombocytopenia, unspecified: Secondary | ICD-10-CM | POA: Diagnosis present

## 2020-04-16 DIAGNOSIS — M310 Hypersensitivity angiitis: Secondary | ICD-10-CM | POA: Diagnosis present

## 2020-04-16 DIAGNOSIS — Z7682 Awaiting organ transplant status: Secondary | ICD-10-CM | POA: Diagnosis not present

## 2020-04-16 DIAGNOSIS — D62 Acute posthemorrhagic anemia: Secondary | ICD-10-CM | POA: Diagnosis not present

## 2020-04-16 DIAGNOSIS — E1165 Type 2 diabetes mellitus with hyperglycemia: Secondary | ICD-10-CM | POA: Diagnosis not present

## 2020-04-16 DIAGNOSIS — E1129 Type 2 diabetes mellitus with other diabetic kidney complication: Secondary | ICD-10-CM | POA: Diagnosis not present

## 2020-04-16 DIAGNOSIS — I8510 Secondary esophageal varices without bleeding: Secondary | ICD-10-CM | POA: Diagnosis present

## 2020-04-16 DIAGNOSIS — E1122 Type 2 diabetes mellitus with diabetic chronic kidney disease: Secondary | ICD-10-CM | POA: Diagnosis present

## 2020-04-16 DIAGNOSIS — Z8616 Personal history of COVID-19: Secondary | ICD-10-CM | POA: Diagnosis not present

## 2020-04-16 DIAGNOSIS — Z823 Family history of stroke: Secondary | ICD-10-CM

## 2020-04-16 DIAGNOSIS — I5032 Chronic diastolic (congestive) heart failure: Secondary | ICD-10-CM | POA: Diagnosis not present

## 2020-04-16 DIAGNOSIS — K922 Gastrointestinal hemorrhage, unspecified: Secondary | ICD-10-CM | POA: Diagnosis present

## 2020-04-16 DIAGNOSIS — Z79899 Other long term (current) drug therapy: Secondary | ICD-10-CM

## 2020-04-16 DIAGNOSIS — Z833 Family history of diabetes mellitus: Secondary | ICD-10-CM

## 2020-04-16 DIAGNOSIS — K929 Disease of digestive system, unspecified: Secondary | ICD-10-CM | POA: Diagnosis not present

## 2020-04-16 DIAGNOSIS — E872 Acidosis: Secondary | ICD-10-CM | POA: Diagnosis not present

## 2020-04-16 DIAGNOSIS — K3189 Other diseases of stomach and duodenum: Secondary | ICD-10-CM | POA: Diagnosis present

## 2020-04-16 DIAGNOSIS — K7460 Unspecified cirrhosis of liver: Secondary | ICD-10-CM | POA: Diagnosis present

## 2020-04-16 DIAGNOSIS — Z888 Allergy status to other drugs, medicaments and biological substances status: Secondary | ICD-10-CM

## 2020-04-16 DIAGNOSIS — R1084 Generalized abdominal pain: Secondary | ICD-10-CM | POA: Diagnosis not present

## 2020-04-16 DIAGNOSIS — I959 Hypotension, unspecified: Secondary | ICD-10-CM | POA: Diagnosis not present

## 2020-04-16 DIAGNOSIS — R069 Unspecified abnormalities of breathing: Secondary | ICD-10-CM

## 2020-04-16 DIAGNOSIS — E8889 Other specified metabolic disorders: Secondary | ICD-10-CM | POA: Diagnosis not present

## 2020-04-16 DIAGNOSIS — R571 Hypovolemic shock: Secondary | ICD-10-CM | POA: Diagnosis not present

## 2020-04-16 DIAGNOSIS — I10 Essential (primary) hypertension: Secondary | ICD-10-CM | POA: Diagnosis not present

## 2020-04-16 DIAGNOSIS — I132 Hypertensive heart and chronic kidney disease with heart failure and with stage 5 chronic kidney disease, or end stage renal disease: Secondary | ICD-10-CM | POA: Diagnosis not present

## 2020-04-16 DIAGNOSIS — H5440 Blindness, one eye, unspecified eye: Secondary | ICD-10-CM | POA: Diagnosis present

## 2020-04-16 DIAGNOSIS — Z794 Long term (current) use of insulin: Secondary | ICD-10-CM

## 2020-04-16 DIAGNOSIS — K766 Portal hypertension: Secondary | ICD-10-CM | POA: Diagnosis present

## 2020-04-16 DIAGNOSIS — R0902 Hypoxemia: Secondary | ICD-10-CM | POA: Diagnosis not present

## 2020-04-16 DIAGNOSIS — I864 Gastric varices: Secondary | ICD-10-CM | POA: Diagnosis present

## 2020-04-16 DIAGNOSIS — I509 Heart failure, unspecified: Secondary | ICD-10-CM | POA: Diagnosis not present

## 2020-04-16 DIAGNOSIS — R404 Transient alteration of awareness: Secondary | ICD-10-CM | POA: Diagnosis not present

## 2020-04-16 DIAGNOSIS — D631 Anemia in chronic kidney disease: Secondary | ICD-10-CM | POA: Diagnosis present

## 2020-04-16 DIAGNOSIS — K920 Hematemesis: Secondary | ICD-10-CM | POA: Diagnosis not present

## 2020-04-16 DIAGNOSIS — E875 Hyperkalemia: Secondary | ICD-10-CM | POA: Diagnosis not present

## 2020-04-16 DIAGNOSIS — I8500 Esophageal varices without bleeding: Secondary | ICD-10-CM | POA: Diagnosis not present

## 2020-04-16 DIAGNOSIS — A419 Sepsis, unspecified organism: Secondary | ICD-10-CM | POA: Diagnosis not present

## 2020-04-16 DIAGNOSIS — K921 Melena: Secondary | ICD-10-CM | POA: Diagnosis present

## 2020-04-16 DIAGNOSIS — Z992 Dependence on renal dialysis: Secondary | ICD-10-CM | POA: Diagnosis not present

## 2020-04-16 DIAGNOSIS — Z8249 Family history of ischemic heart disease and other diseases of the circulatory system: Secondary | ICD-10-CM

## 2020-04-16 LAB — I-STAT CHEM 8, ED
BUN: 49 mg/dL — ABNORMAL HIGH (ref 8–23)
Calcium, Ion: 1.04 mmol/L — ABNORMAL LOW (ref 1.15–1.40)
Chloride: 100 mmol/L (ref 98–111)
Creatinine, Ser: 4.4 mg/dL — ABNORMAL HIGH (ref 0.61–1.24)
Glucose, Bld: 352 mg/dL — ABNORMAL HIGH (ref 70–99)
HCT: 20 % — ABNORMAL LOW (ref 39.0–52.0)
Hemoglobin: 6.8 g/dL — CL (ref 13.0–17.0)
Potassium: 6 mmol/L — ABNORMAL HIGH (ref 3.5–5.1)
Sodium: 133 mmol/L — ABNORMAL LOW (ref 135–145)
TCO2: 22 mmol/L (ref 22–32)

## 2020-04-16 LAB — CBC
HCT: 23.9 % — ABNORMAL LOW (ref 39.0–52.0)
HCT: 20.5 % — ABNORMAL LOW (ref 39.0–52.0)
Hemoglobin: 7.6 g/dL — ABNORMAL LOW (ref 13.0–17.0)
Hemoglobin: 6.5 g/dL — CL (ref 13.0–17.0)
MCH: 26.7 pg (ref 26.0–34.0)
MCH: 26.4 pg (ref 26.0–34.0)
MCHC: 31.8 g/dL (ref 30.0–36.0)
MCHC: 31.7 g/dL (ref 30.0–36.0)
MCV: 83.9 fL (ref 80.0–100.0)
MCV: 83.3 fL (ref 80.0–100.0)
Platelets: 38 10*3/uL — ABNORMAL LOW (ref 150–400)
Platelets: 30 10*3/uL — ABNORMAL LOW (ref 150–400)
RBC: 2.85 MIL/uL — ABNORMAL LOW (ref 4.22–5.81)
RBC: 2.46 MIL/uL — ABNORMAL LOW (ref 4.22–5.81)
RDW: 15.7 % — ABNORMAL HIGH (ref 11.5–15.5)
RDW: 15.6 % — ABNORMAL HIGH (ref 11.5–15.5)
WBC: 5 10*3/uL (ref 4.0–10.5)
WBC: 4.2 10*3/uL (ref 4.0–10.5)
nRBC: 0 % (ref 0.0–0.2)
nRBC: 0 % (ref 0.0–0.2)

## 2020-04-16 LAB — I-STAT VENOUS BLOOD GAS, ED
Acid-Base Excess: 1 mmol/L (ref 0.0–2.0)
Bicarbonate: 23.5 mmol/L (ref 20.0–28.0)
Calcium, Ion: 1.03 mmol/L — ABNORMAL LOW (ref 1.15–1.40)
HCT: 20 % — ABNORMAL LOW (ref 39.0–52.0)
Hemoglobin: 6.8 g/dL — CL (ref 13.0–17.0)
O2 Saturation: 100 %
Potassium: 6.1 mmol/L — ABNORMAL HIGH (ref 3.5–5.1)
Sodium: 134 mmol/L — ABNORMAL LOW (ref 135–145)
TCO2: 24 mmol/L (ref 22–32)
pCO2, Ven: 26.4 mmHg — ABNORMAL LOW (ref 44.0–60.0)
pH, Ven: 7.559 — ABNORMAL HIGH (ref 7.250–7.430)
pO2, Ven: 140 mmHg — ABNORMAL HIGH (ref 32.0–45.0)

## 2020-04-16 LAB — COMPREHENSIVE METABOLIC PANEL
ALT: 16 U/L (ref 0–44)
AST: 27 U/L (ref 15–41)
Albumin: 2.1 g/dL — ABNORMAL LOW (ref 3.5–5.0)
Alkaline Phosphatase: 73 U/L (ref 38–126)
Anion gap: 14 (ref 5–15)
BUN: 52 mg/dL — ABNORMAL HIGH (ref 8–23)
CO2: 20 mmol/L — ABNORMAL LOW (ref 22–32)
Calcium: 8.8 mg/dL — ABNORMAL LOW (ref 8.9–10.3)
Chloride: 101 mmol/L (ref 98–111)
Creatinine, Ser: 4.32 mg/dL — ABNORMAL HIGH (ref 0.61–1.24)
GFR calc Af Amer: 15 mL/min — ABNORMAL LOW (ref >60)
GFR calc non Af Amer: 13 mL/min — ABNORMAL LOW (ref >60)
Glucose, Bld: 373 mg/dL — ABNORMAL HIGH (ref 70–99)
Potassium: 6.1 mmol/L — ABNORMAL HIGH (ref 3.5–5.1)
Sodium: 135 mmol/L (ref 135–145)
Total Bilirubin: 0.8 mg/dL (ref 0.3–1.2)
Total Protein: 4.8 g/dL — ABNORMAL LOW (ref 6.5–8.1)

## 2020-04-16 LAB — GLUCOSE, CAPILLARY
Glucose-Capillary: 127 mg/dL — ABNORMAL HIGH (ref 70–99)
Glucose-Capillary: 151 mg/dL — ABNORMAL HIGH (ref 70–99)
Glucose-Capillary: 118 mg/dL — ABNORMAL HIGH (ref 70–99)

## 2020-04-16 LAB — POTASSIUM: Potassium: 5.6 mmol/L — ABNORMAL HIGH (ref 3.5–5.1)

## 2020-04-16 LAB — SARS CORONAVIRUS 2 BY RT PCR (HOSPITAL ORDER, PERFORMED IN ~~LOC~~ HOSPITAL LAB): SARS Coronavirus 2: NEGATIVE

## 2020-04-16 LAB — LACTIC ACID, PLASMA
Lactic Acid, Venous: 5.8 mmol/L (ref 0.5–1.9)
Lactic Acid, Venous: 2.9 mmol/L (ref 0.5–1.9)

## 2020-04-16 LAB — POC OCCULT BLOOD, ED: Fecal Occult Bld: POSITIVE — AB

## 2020-04-16 LAB — FIBRINOGEN: Fibrinogen: 153 mg/dL — ABNORMAL LOW (ref 210–475)

## 2020-04-16 LAB — CBG MONITORING, ED: Glucose-Capillary: 364 mg/dL — ABNORMAL HIGH (ref 70–99)

## 2020-04-16 LAB — PREPARE RBC (CROSSMATCH)

## 2020-04-16 LAB — APTT: aPTT: 20 seconds — ABNORMAL LOW (ref 24–36)

## 2020-04-16 LAB — PROTIME-INR
INR: 1.6 — ABNORMAL HIGH (ref 0.8–1.2)
Prothrombin Time: 18.3 seconds — ABNORMAL HIGH (ref 11.4–15.2)

## 2020-04-16 LAB — LIPASE, BLOOD: Lipase: 24 U/L (ref 11–51)

## 2020-04-16 MED ORDER — ONDANSETRON HCL 4 MG/2ML IJ SOLN
4.0000 mg | Freq: Once | INTRAMUSCULAR | Status: AC
  Administered 2020-04-16: 4 mg via INTRAVENOUS

## 2020-04-16 MED ORDER — CHLORHEXIDINE GLUCONATE CLOTH 2 % EX PADS
6.0000 | MEDICATED_PAD | Freq: Every day | CUTANEOUS | Status: DC
  Administered 2020-04-16 – 2020-04-19 (×3): 6 via TOPICAL

## 2020-04-16 MED ORDER — SODIUM CHLORIDE 0.9 % IV SOLN
1.0000 g | INTRAVENOUS | Status: DC
  Administered 2020-04-16: 1 g via INTRAVENOUS
  Filled 2020-04-16 (×2): qty 10

## 2020-04-16 MED ORDER — INSULIN ASPART 100 UNIT/ML ~~LOC~~ SOLN
6.0000 [IU] | Freq: Once | SUBCUTANEOUS | Status: AC
  Administered 2020-04-16: 6 [IU] via INTRAVENOUS

## 2020-04-16 MED ORDER — LACTATED RINGERS IV BOLUS (SEPSIS)
1000.0000 mL | Freq: Once | INTRAVENOUS | Status: AC
  Administered 2020-04-16: 1000 mL via INTRAVENOUS

## 2020-04-16 MED ORDER — PANTOPRAZOLE SODIUM 40 MG IV SOLR
40.0000 mg | Freq: Two times a day (BID) | INTRAVENOUS | Status: DC
  Administered 2020-04-16 – 2020-04-19 (×7): 40 mg via INTRAVENOUS
  Filled 2020-04-16 (×7): qty 40

## 2020-04-16 MED ORDER — OCTREOTIDE LOAD VIA INFUSION
50.0000 ug | Freq: Once | INTRAVENOUS | Status: AC
  Administered 2020-04-16: 50 ug via INTRAVENOUS
  Filled 2020-04-16: qty 25

## 2020-04-16 MED ORDER — POLYETHYLENE GLYCOL 3350 17 G PO PACK: 17.0000 g | PACK | Freq: Every day | ORAL | Status: DC | PRN

## 2020-04-16 MED ORDER — LORAZEPAM 2 MG/ML IJ SOLN
INTRAMUSCULAR | Status: AC
  Filled 2020-04-16: qty 1

## 2020-04-16 MED ORDER — SODIUM CHLORIDE 0.9 % IV SOLN: 250.0000 mL | INTRAVENOUS | Status: DC

## 2020-04-16 MED ORDER — PHENYLEPHRINE HCL-NACL 10-0.9 MG/250ML-% IV SOLN
25.0000 ug/min | INTRAVENOUS | Status: DC
  Filled 2020-04-16: qty 250

## 2020-04-16 MED ORDER — SODIUM CHLORIDE 0.9 % IV SOLN
1000.0000 mL | INTRAVENOUS | Status: DC
  Administered 2020-04-16 – 2020-04-17 (×4): 1000 mL via INTRAVENOUS

## 2020-04-16 MED ORDER — METOCLOPRAMIDE HCL 5 MG/ML IJ SOLN
5.0000 mg | Freq: Once | INTRAMUSCULAR | Status: AC
  Administered 2020-04-16: 5 mg via INTRAVENOUS
  Filled 2020-04-16: qty 2

## 2020-04-16 MED ORDER — SODIUM CHLORIDE 0.9 % IV BOLUS (SEPSIS)
1000.0000 mL | Freq: Once | INTRAVENOUS | Status: AC
  Administered 2020-04-16: 1000 mL via INTRAVENOUS

## 2020-04-16 MED ORDER — SODIUM CHLORIDE 0.9% IV SOLUTION: Freq: Once | INTRAVENOUS | Status: AC

## 2020-04-16 MED ORDER — PANTOPRAZOLE SODIUM 40 MG IV SOLR
40.0000 mg | Freq: Once | INTRAVENOUS | Status: AC
  Administered 2020-04-16: 40 mg via INTRAVENOUS
  Filled 2020-04-16: qty 40

## 2020-04-16 MED ORDER — SODIUM CHLORIDE 0.9 % IV SOLN
1.0000 g | Freq: Once | INTRAVENOUS | Status: AC
  Administered 2020-04-16: 1 g via INTRAVENOUS
  Filled 2020-04-16: qty 10

## 2020-04-16 MED ORDER — INSULIN ASPART 100 UNIT/ML ~~LOC~~ SOLN
1.0000 [IU] | SUBCUTANEOUS | Status: DC
  Administered 2020-04-16: 1 [IU] via SUBCUTANEOUS
  Administered 2020-04-16: 2 [IU] via SUBCUTANEOUS
  Administered 2020-04-17 (×2): 1 [IU] via SUBCUTANEOUS

## 2020-04-16 MED ORDER — SODIUM CHLORIDE 0.9 % IV SOLN
50.0000 ug/h | INTRAVENOUS | Status: DC
  Administered 2020-04-16 – 2020-04-19 (×8): 50 ug/h via INTRAVENOUS
  Filled 2020-04-16 (×12): qty 1

## 2020-04-16 MED ORDER — DOCUSATE SODIUM 100 MG PO CAPS: 100.0000 mg | ORAL_CAPSULE | Freq: Two times a day (BID) | ORAL | Status: DC | PRN

## 2020-04-16 NOTE — H&P (Signed)
NAMEAiden [name], MRN:  [id number], DOB:  [birth year], LOS: 0 ADMISSION DATE:  04/16/2020, CONSULTATION DATE:  04/16/2020 REFERRING MD:  EDP, CHIEF COMPLAINT:  Shock  Brief History   69 year old man with history of HFpEF, cirrhosis c/b esophageal varices, ESRD on HD, Goodpasture's disease, hypertension, type 2 diabetes presented after fall found to have hypotension and GI bleed.  History of present illness   68 year old man with history of HFpEF, cirrhosis c/b esophageal varices, ESRD on HD, Goodpasture's disease, hypertension, type 2 diabetes presented after fall.  He is accompanied by his son who helped with providing history.  He has been "spitting up blood" and also had bright red blood per rectum that started today.  Presently he denies chest pain, shortness of breath, abdominal pain.  Per EMS was initially found to be hypotensive.  He was given IV fluids with some improvement in his blood pressure.  Past Medical History  HFpEF, cirrhosis c/b esophageal varices, ESRD on HD, Goodpasture's disease, hypertension, type 2 diabetes  Significant Hospital Events   9/12> admit  Consults:  9/12 GI  Procedures:  None  Significant Diagnostic Tests:  XR abd 9/12>No acute abdominal findings.  XR chest 9/12>No acute cardiopulmonary process.  Micro Data:  COVID 9/12> neg BCx 9/12> neg  Antimicrobials:  ceftriaxone 9/12>  Interim history/subjective:  Feels a little better now  Objective   Blood pressure (!) 121/52, pulse 78, temperature 97.7 F (36.5 C), temperature source Axillary, resp. rate 17, height 5' 5"  (1.651 m), weight 66 kg, SpO2 100 %.        Intake/Output Summary (Last 24 hours) at 04/16/2020 0705 Last data filed at 04/16/2020 2993 Gross per 24 hour  Intake 315 ml  Output --  Net 315 ml   Filed Weights   04/16/20 0515  Weight: 66 kg    Examination: General: NAD HENT: [place]/AT Lungs: Clear bilaterally without wheezes Cardiovascular: RRR Abdomen: Soft,  nondistended, nontender Extremities: No LE edema, warm, dry Neuro: Able to follow commands and answer questions appropriately  Assessment & Plan:  69 year old man with history of HFpEF, cirrhosis c/b esophageal varices, ESRD on HD, Goodpasture's disease, hypertension, type 2 diabetes presented after fall found to have hypotension and GI bleed.  GI bleed, possible variceal bleed, shock, cirrhosis: --Octreotide gtt --IV PPI BID --GI consulted by ED --Follow CBC q4hr, transfuse pRBC prn for goal hgb >7, Plt prn for goal >50k --Follow PT/INR --Check fibrinogen --Ceftriaxone --follow up blood culture, UA if able --Neo gtt for goal MAP > 65 --Hold home antihypertensives  Lactic acidosis: improving  ESRD:  --Consult nephrology in AM  DM: --Monitor CBG and SSI  Best practice:  Diet: NPO Pain/Anxiety/Delirium protocol (if indicated): Monitor VAP protocol (if indicated): N/A DVT prophylaxis: SCDs GI prophylaxis: See above Glucose control: See above Mobility: OOB when able Code Status: Full Family Communication: Discussed with patient and his son at bedside Disposition: ICU  Labs   CBC: Recent Labs  Lab 04/16/20 0229 04/16/20 0259  WBC 5.0  --   NEUTROABS 2.9  --   HGB 7.0* 6.8*  6.8*  HCT 22.8* 20.0*  20.0*  MCV 86.7  --   PLT 39*  --     Basic Metabolic Panel: Recent Labs  Lab 04/16/20 0229 04/16/20 0259  NA 135 134*  133*  K 6.1* 6.1*  6.0*  CL 101 100  CO2 20*  --   GLUCOSE 373* 352*  BUN 52* 49*  CREATININE 4.32* 4.40*  CALCIUM 8.8*  --  GFR: Estimated Creatinine Clearance: 14 mL/min (A) (by C-G formula based on SCr of 4.4 mg/dL (H)). Recent Labs  Lab 04/16/20 0229 04/16/20 0428  WBC 5.0  --   LATICACIDVEN 5.8* 2.9*    Liver Function Tests: Recent Labs  Lab 04/16/20 0229  AST 27  ALT 16  ALKPHOS 73  BILITOT 0.8  PROT 4.8*  ALBUMIN 2.1*   Recent Labs  Lab 04/16/20 0229  LIPASE 24   No results for input(s): AMMONIA in the last  168 hours.  ABG    Component Value Date/Time   PHART 7.453 (H) 12/20/2017 1149   PCO2ART 29.6 (L) 12/20/2017 1149   PO2ART 63.0 (L) 12/20/2017 1149   HCO3 23.5 04/16/2020 0259   TCO2 24 04/16/2020 0259   TCO2 22 04/16/2020 0259   ACIDBASEDEF 3.0 (H) 12/20/2017 1149   O2SAT 100.0 04/16/2020 0259     Coagulation Profile: Recent Labs  Lab 04/16/20 0229  INR 1.6*    Cardiac Enzymes: No results for input(s): CKTOTAL, CKMB, CKMBINDEX, TROPONINI in the last 168 hours.  HbA1C: Hemoglobin A1C  Date/Time Value Ref Range Status  03/17/2019 10:24 AM 6.8 (A) 4.0 - 5.6 % Final  10/06/2018 08:28 AM 7.6 (A) 4.0 - 5.6 % Final   Hgb A1c MFr Bld  Date/Time Value Ref Range Status  07/09/2018 09:15 AM 7.7 (H) 4.6 - 6.5 % Final    Comment:    Glycemic Control Guidelines for People with Diabetes:Non Diabetic:  <6%Goal of Therapy: <7%Additional Action Suggested:  >8%   03/09/2018 12:29 PM 7.0 (H) 4.8 - 5.6 % Final    Comment:             Prediabetes: 5.7 - 6.4          Diabetes: >6.4          Glycemic control for adults with diabetes: <7.0     CBG: Recent Labs  Lab 04/16/20 0255  GLUCAP 364*    Review of Systems:   10 point ROS performed and negative except per HPI  Past Medical History  He,  has a past medical history of Anemia, Blind one eye, CHF (congestive heart failure) ([place]), Cirrhosis ([place]), Cirrhosis ([place]), CKD (chronic kidney disease), stage IV ([place]), Dialysis patient ([place]), Goodpasture's disease ([place]), Grade I diastolic dysfunction (67/54/4920), History of anemia due to chronic kidney disease, History of blood transfusion (X 1), History of plasmapheresis, Hypertension, Pancytopenia ([place]) (08/20/2017), and Type II diabetes mellitus ([place]).   Surgical History    Past Surgical History:  Procedure Laterality Date  . AV FISTULA PLACEMENT Left 12/26/2017   Procedure: LEFT BRACHIOCEPHALIC ARTERIOVENOUS (AV) FISTULA CREATION;  Surgeon: Conrad [place], MD;  Location: [place];   Service: Vascular;  Laterality: Left;  . BASCILIC VEIN TRANSPOSITION Left 02/16/2018   Procedure: BRACHIOBASILIC VEIN TRANSPOSITION SECOND STAGE;  Surgeon: Conrad [place], MD;  Location: [facility];  Service: Vascular;  Laterality: Left;  . CATARACT EXTRACTION W/ INTRAOCULAR LENS  IMPLANT, BILATERAL Bilateral   . ESOPHAGEAL BANDING N/A 04/01/2018   Procedure: ESOPHAGEAL BANDING;  Surgeon: Otis Brace, MD;  Location: [facility];  Service: Gastroenterology;  Laterality: N/A;  . ESOPHAGEAL BANDING N/A 06/29/2018   Procedure: ESOPHAGEAL BANDING;  Surgeon: Otis Brace, MD;  Location: [facility];  Service: Gastroenterology;  Laterality: N/A;  . ESOPHAGOGASTRODUODENOSCOPY (EGD) WITH PROPOFOL N/A 04/01/2018   Procedure: ESOPHAGOGASTRODUODENOSCOPY (EGD) WITH PROPOFOL;  Surgeon: Otis Brace, MD;  Location: [facility];  Service: Gastroenterology;  Laterality: N/A;  . ESOPHAGOGASTRODUODENOSCOPY (EGD) WITH PROPOFOL  N/A 06/29/2018   Procedure: ESOPHAGOGASTRODUODENOSCOPY (EGD) WITH PROPOFOL;  Surgeon: Otis Brace, MD;  Location: [facility];  Service: Gastroenterology;  Laterality: N/A;  . ESOPHAGOGASTRODUODENOSCOPY (EGD) WITH PROPOFOL N/A 09/30/2018   Procedure: ESOPHAGOGASTRODUODENOSCOPY (EGD) WITH PROPOFOL;  Surgeon: Otis Brace, MD;  Location: [facility];  Service: Gastroenterology;  Laterality: N/A;  . ESOPHAGOGASTRODUODENOSCOPY (EGD) WITH PROPOFOL N/A 04/20/2019   Procedure: ESOPHAGOGASTRODUODENOSCOPY (EGD) WITH PROPOFOL;  Surgeon: Otis Brace, MD;  Location: [facility];  Service: Gastroenterology;  Laterality: N/A;  . I & D EXTREMITY Left 02/18/2018   Procedure: IRRIGATION AND DEBRIDEMENT LEFT ARM;  Surgeon: Serafina Mitchell, MD;  Location: [facility];  Service: Vascular;  Laterality: Left;  . INSERTION OF DIALYSIS CATHETER N/A 01/19/2018   Procedure: INSERTION OF DIALYSIS CATHETER - RIGHT INTERNAL JUGULAR PLACEMENT;  Surgeon: Rosetta Posner, MD;  Location: [place];  Service:  Vascular;  Laterality: N/A;  . IR FLUORO GUIDE CV LINE RIGHT  11/10/2017  . IR PARACENTESIS  12/10/2017  . IR PARACENTESIS  12/26/2017  . IR REMOVAL TUN CV CATH W/O FL  11/28/2017  . IR THROMBECTOMY AV FISTULA W/THROMBOLYSIS/PTA INC/SHUNT/IMG LEFT Left 06/02/2019  . IR US GUIDE VASC ACCESS LEFT  06/02/2019  . IR US GUIDE VASC ACCESS RIGHT  11/10/2017  . NECK SURGERY  2007   "back of neck; no hardware in there"     Social History   reports that he has never smoked. He has never used smokeless tobacco. He reports that he does not drink alcohol and does not use drugs.   Family History   His family history includes Diabetes in his sister; Diabetes Mellitus II in his sister; Heart attack in his brother; Stroke in his brother.   Allergies Allergies  Allergen Reactions  . Heparin Other (See Comments)    Patient is Muslim and is not permitted Pork derative due to religious belief)  . Pork-Derived Products Other (See Comments)    Patient does not eat pork due to religious beliefs     Home Medications  Prior to Admission medications   Medication Sig Start Date End Date Taking? Authorizing Provider  acetaminophen (TYLENOL) 650 MG CR tablet Take 650 mg by mouth every 8 (eight) hours as needed for pain.   Yes [provider]  amLODipine (NORVASC) 10 MG tablet Take 5 mg by mouth daily.   Yes [provider]  calcium acetate (PHOSLO) 667 MG capsule Take 2 capsules (1,334 mg total) by mouth 3 (three) times daily with meals. Patient taking differently: Take 667 mg by mouth 3 (three) times daily with meals.  01/26/18  Yes Purohit, Konrad Dolores, MD  carvedilol (COREG) 12.5 MG tablet Take 25 mg by mouth 2 (two) times daily. 03/29/20  Yes [provider]  cetirizine (ZYRTEC) 5 MG tablet Take 1 tablet (5 mg total) by mouth 3 (three) times a week. Before dialysis 01/07/20  Yes Rutherford Guys, MD  doxycycline (VIBRA-TABS) 100 MG tablet Take 1 tablet (100 mg total) by mouth 2 (two) times  daily. 04/03/20  Yes Trula Slade, DPM  insulin degludec (TRESIBA FLEXTOUCH) 100 UNIT/ML SOPN FlexTouch Pen Inject 0.14 mLs (14 Units total) into the skin daily. Inject 14 units under the skin once daily. Patient taking differently: Inject 12 Units into the skin daily.  03/02/19  Yes Elayne Snare, MD  insulin regular (NOVOLIN R,HUMULIN R) 100 units/mL injection Inject 8-10 Units into the skin See admin instructions. INJECT 8 UNITS UNDER THE SKIN AT BREAKFAST, 10 UNITS AT  LUNCH AND 8 UNITS AT DINNER.   Yes [provider]  lidocaine-prilocaine (EMLA) cream Apply 1 application topically Every Tuesday,Thursday,and Saturday with dialysis. 08/24/18  Yes [provider]  loperamide (IMODIUM A-D) 2 MG tablet Take 1 tablet (2 mg total) by mouth 4 (four) times daily as needed for diarrhea or loose stools. 07/19/19  Yes Maximiano Coss, NP  Multiple Vitamins-Minerals (MULTIVITAMIN WITH MINERALS) tablet Take 1 tablet by mouth daily.   Yes [provider]  mupirocin ointment (BACTROBAN) 2 % Apply 1 application topically 2 (two) times daily. 04/03/20  Yes Trula Slade, DPM  ondansetron (ZOFRAN ODT) 4 MG disintegrating tablet Take 1 tablet (4 mg total) by mouth every 8 (eight) hours as needed for nausea or vomiting. Dissolves in mouth 08/09/19  Yes [name], [employer] C, PA-C  Polyethyl Glycol-Propyl Glycol (SYSTANE) 0.4-0.3 % SOLN Place 1 drop into both eyes 3 (three) times daily as needed (for dry eyes).   Yes [provider]  rosuvastatin (CRESTOR) 5 MG tablet Take 1 tablet (5 mg total) by mouth daily. 03/17/19  Yes Elayne Snare, MD  glucose blood (ONETOUCH VERIO) test strip USE 1 STRIP TO CHECK GLUCOSE THREE TIMES DAILY. DX:E11.65 01/31/20   Elayne Snare, MD  Insulin Pen Needle (ADVOCATE INSULIN PEN NEEDLES) 31G X 5 MM MISC Use four times daily to inject insulin 04/27/18   Elayne Snare, MD  ofloxacin (FLOXIN) 0.3 % OTIC solution Place 5 drops into the right ear daily. Patient not  taking: Reported on 04/16/2020 08/09/19   [name], Elesa Hacker, PA-C  ONE TOUCH LANCETS MISC Use to test blood sugar three times daily 04/27/18   Elayne Snare, MD     Critical care time: The patient is critically ill with multiple organ systems failure and requires high complexity decision making for assessment and support, frequent evaluation and titration of therapies, application of advanced monitoring technologies and extensive interpretation of multiple databases.   Critical Care Time devoted to patient care services described in this note is  40 Minutes. This time reflects time of care of this signee. This critical care time does not reflect procedure time, or teaching time or supervisory time of PA/NP/Med student/Med Resident etc but could involve care discussion time.  Jacques Earthly, M.D. [facility] Pulmonary/Critical Care Medicine After hours pager: [phone].

## 2020-04-16 NOTE — Consult Note (Signed)
[facility] Renal Consultation Note    Indication for Consultation:  Management of ESRD/hemodialysis, anemia, hypertension/volume, and secondary hyperparathyroidism  HPI: William [name] is a 69 y.o. male with past medical history including ESRD on dialysis TTS, CHF, cirrhosis, Goodpasture's disease, hypertension, and type 2 diabetes mellitus, who presented to the ED this morning with fall and acute GI bleed.  Patient son provides some of the history and acts as a Optometrist today.  Per notes, patient completed dialysis yesterday and was in his normal state of health until about 5 PM when he began to complain of abdominal discomfort.  Approximately 1 AM, his wife found him collapsed in the bathroom.  Patient was having active bright red bloody emesis and bloody bowel movements.  He was hypotensive with EMS.  Hemoglobin was down to 6.9 with thrombocytopenia and hyperglycemia.  He was given 2 L IV fluids, Protonix, and Rocephin.  Receiving 2 units packed red blood cells.  GI recommended 1 unit of platelets and octreotide.  GI did plan for EGD today however potassium was elevated at 6.1. EKG with minimal ST changes, otherwise WNL.   Pt seen in ICU. Pt awake and alert, BP currently 126/45 and RBC transfusing. Patient's son assists with translation. Denies any issues with HD recently and completed full treatment. Saturday without complication. Denies SOB, CP, dizziness and edema. Per RN, he did have another large bloody bowel movement recently.   Past Medical History:  Diagnosis Date  . Anemia   . Blind one eye   . CHF (congestive heart failure) ([place])   . Cirrhosis ([place])   . Cirrhosis ([place])   . CKD (chronic kidney disease), stage IV ([place])   . Dialysis patient ([place])   . Goodpasture's disease [facility])    on outpatient plasmapheresis/notes 11/20/2017  . Grade I diastolic dysfunction 02/58/5277  . History of anemia due to chronic kidney disease   . History of blood transfusion X 1   "UGIB; low  blood count"  . History of plasmapheresis    "qod" (11/20/2017)  . Hypertension   . Pancytopenia ([place]) 08/20/2017  . Type II diabetes mellitus ([place])    Past Surgical History:  Procedure Laterality Date  . AV FISTULA PLACEMENT Left 12/26/2017   Procedure: LEFT BRACHIOCEPHALIC ARTERIOVENOUS (AV) FISTULA CREATION;  Surgeon: Conrad Evans, MD;  Location: [place];  Service: Vascular;  Laterality: Left;  . BASCILIC VEIN TRANSPOSITION Left 02/16/2018   Procedure: BRACHIOBASILIC VEIN TRANSPOSITION SECOND STAGE;  Surgeon: Conrad Hamburg, MD;  Location: [place];  Service: Vascular;  Laterality: Left;  . CATARACT EXTRACTION W/ INTRAOCULAR LENS  IMPLANT, BILATERAL Bilateral   . ESOPHAGEAL BANDING N/A 04/01/2018   Procedure: ESOPHAGEAL BANDING;  Surgeon: Otis Brace, MD;  Location: [facility];  Service: Gastroenterology;  Laterality: N/A;  . ESOPHAGEAL BANDING N/A 06/29/2018   Procedure: ESOPHAGEAL BANDING;  Surgeon: Otis Brace, MD;  Location: [facility];  Service: Gastroenterology;  Laterality: N/A;  . ESOPHAGOGASTRODUODENOSCOPY (EGD) WITH PROPOFOL N/A 04/01/2018   Procedure: ESOPHAGOGASTRODUODENOSCOPY (EGD) WITH PROPOFOL;  Surgeon: Otis Brace, MD;  [facility]: [facility];  Service: Gastroenterology;  Laterality: N/A;  . ESOPHAGOGASTRODUODENOSCOPY (EGD) WITH PROPOFOL N/A 06/29/2018   Procedure: ESOPHAGOGASTRODUODENOSCOPY (EGD) WITH PROPOFOL;  Surgeon: Otis Brace, MD;  [facility];  Service: Gastroenterology;  Laterality: N/A;  . ESOPHAGOGASTRODUODENOSCOPY (EGD) WITH PROPOFOL N/A 09/30/2018   Procedure: ESOPHAGOGASTRODUODENOSCOPY (EGD) WITH PROPOFOL;  Surgeon: Otis Brace, MD;  [facility]: [facility];  Service: Gastroenterology;  Laterality: N/A;  . ESOPHAGOGASTRODUODENOSCOPY (EGD) WITH PROPOFOL N/A  04/20/2019   Procedure: ESOPHAGOGASTRODUODENOSCOPY (EGD) WITH PROPOFOL;  Surgeon: Otis Brace, MD;  Location: [facility];  Service: Gastroenterology;  Laterality:  N/A;  . I & D EXTREMITY Left 02/18/2018   Procedure: IRRIGATION AND DEBRIDEMENT LEFT ARM;  Surgeon: Serafina Mitchell, MD;  Location: [facility];  Service: Vascular;  Laterality: Left;  . INSERTION OF DIALYSIS CATHETER N/A 01/19/2018   Procedure: INSERTION OF DIALYSIS CATHETER - RIGHT INTERNAL JUGULAR PLACEMENT;  Surgeon: Rosetta Posner, MD;  Location: [place];  Service: Vascular;  Laterality: N/A;  . IR FLUORO GUIDE CV LINE RIGHT  11/10/2017  . IR PARACENTESIS  12/10/2017  . IR PARACENTESIS  12/26/2017  . IR REMOVAL TUN CV CATH W/O FL  11/28/2017  . IR THROMBECTOMY AV FISTULA W/THROMBOLYSIS/PTA INC/SHUNT/IMG LEFT Left 06/02/2019  . IR US GUIDE VASC ACCESS LEFT  06/02/2019  . IR US GUIDE VASC ACCESS RIGHT  11/10/2017  . NECK SURGERY  2007   "back of neck; no hardware in there"   Family History  Problem Relation Age of Onset  . Diabetes Mellitus II Sister   . Diabetes Sister   . Stroke Brother   . Heart attack Brother    Social History:  reports that he has never smoked. He has never used smokeless tobacco. He reports that he does not drink alcohol and does not use drugs.  ROS: As per HPI otherwise negative.  Physical Exam: Vitals:   04/16/20 0910 04/16/20 0915 04/16/20 1035 04/16/20 1100  BP: 93/64 (!) 126/48 (!) 126/45 (!) 126/45  Pulse:   83 86  Resp: 15 15 19 15   Temp:   97.6 F (36.4 C)   TempSrc:   Oral   SpO2:   99% 100%  Weight:      Height:         General: Well developed, well nourished, alert Head: Normocephalic, atraumatic, sclera non-icteric, mucus membranes are moist. Neck:  JVD not elevated. Lungs: Clear bilaterally to auscultation without wheezes, rales, or rhonchi. Breathing is unlabored on RA. Heart: RRR with normal S1, S2. No murmurs, rubs, or gallops appreciated. Abdomen: Soft,  non-distended with normoactive bowel sounds.  Lower extremities: No edema or ischemic changes, no open wounds. Neuro: Alert and oriented X 3. Moves all extremities spontaneously. Psych:   Responds to questions appropriately with a normal affect with interpreter assistance Dialysis Access: LUE AVF + bruit  Allergies  Allergen Reactions  . Heparin Other (See Comments)    Patient is Muslim and is not permitted Pork derative due to religious belief)  . Pork-Derived Products Other (See Comments)    Patient does not eat pork due to religious beliefs   Prior to Admission medications   Medication Sig Start Date End Date Taking? Authorizing Provider  acetaminophen (TYLENOL) 650 MG CR tablet Take 650 mg by mouth every 8 (eight) hours as needed for pain.   Yes [provider]  amLODipine (NORVASC) 10 MG tablet Take 5 mg by mouth daily.   Yes [provider]  calcium acetate (PHOSLO) 667 MG capsule Take 2 capsules (1,334 mg total) by mouth 3 (three) times daily with meals. Patient taking differently: Take 667 mg by mouth 3 (three) times daily with meals.  01/26/18  Yes Purohit, Konrad Dolores, MD  carvedilol (COREG) 12.5 MG tablet Take 25 mg by mouth 2 (two) times daily. 03/29/20  Yes [provider]  cetirizine (ZYRTEC) 5 MG tablet Take 1 tablet (5 mg total) by mouth 3 (three) times a week. Before  dialysis 01/07/20  Yes Rutherford Guys, MD  doxycycline (VIBRA-TABS) 100 MG tablet Take 1 tablet (100 mg total) by mouth 2 (two) times daily. 04/03/20  Yes Trula Slade, DPM  insulin degludec (TRESIBA FLEXTOUCH) 100 UNIT/ML SOPN FlexTouch Pen Inject 0.14 mLs (14 Units total) into the skin daily. Inject 14 units under the skin once daily. Patient taking differently: Inject 12 Units into the skin daily.  03/02/19  Yes Elayne Snare, MD  insulin regular (NOVOLIN R,HUMULIN R) 100 units/mL injection Inject 8-10 Units into the skin See admin instructions. INJECT 8 UNITS UNDER THE SKIN AT BREAKFAST, 10 UNITS AT LUNCH AND 8 UNITS AT DINNER.   Yes [provider]  lidocaine-prilocaine (EMLA) cream Apply 1 application topically Every Tuesday,Thursday,and Saturday with dialysis.  08/24/18  Yes [provider]  loperamide (IMODIUM A-D) 2 MG tablet Take 1 tablet (2 mg total) by mouth 4 (four) times daily as needed for diarrhea or loose stools. 07/19/19  Yes Maximiano Coss, NP  Multiple Vitamins-Minerals (MULTIVITAMIN WITH MINERALS) tablet Take 1 tablet by mouth daily.   Yes [provider]  mupirocin ointment (BACTROBAN) 2 % Apply 1 application topically 2 (two) times daily. 04/03/20  Yes Trula Slade, DPM  ondansetron (ZOFRAN ODT) 4 MG disintegrating tablet Take 1 tablet (4 mg total) by mouth every 8 (eight) hours as needed for nausea or vomiting. Dissolves in mouth 08/09/19  Yes [name], [employer] C, PA-C  Polyethyl Glycol-Propyl Glycol (SYSTANE) 0.4-0.3 % SOLN Place 1 drop into both eyes 3 (three) times daily as needed (for dry eyes).   Yes [provider]  rosuvastatin (CRESTOR) 5 MG tablet Take 1 tablet (5 mg total) by mouth daily. 03/17/19  Yes Elayne Snare, MD  glucose blood (ONETOUCH VERIO) test strip USE 1 STRIP TO CHECK GLUCOSE THREE TIMES DAILY. DX:E11.65 01/31/20   Elayne Snare, MD  Insulin Pen Needle (ADVOCATE INSULIN PEN NEEDLES) 31G X 5 MM MISC Use four times daily to inject insulin 04/27/18   Elayne Snare, MD  ofloxacin (FLOXIN) 0.3 % OTIC solution Place 5 drops into the right ear daily. Patient not taking: Reported on 04/16/2020 08/09/19   [name], Elesa Hacker, PA-C  ONE TOUCH LANCETS MISC Use to test blood sugar three times daily 04/27/18   Elayne Snare, MD   Current Facility-Administered Medications  Medication Dose Route Frequency Provider Last Rate Last Admin  . 0.9 %  sodium chloride infusion  1,000 mL Intravenous Continuous Gleason, Otilio Carpen, PA-C 125 mL/hr at 04/16/20 0504 1,000 mL at 04/16/20 0504  . 0.9 %  sodium chloride infusion  250 mL Intravenous Continuous Gleason, Otilio Carpen, PA-C      . cefTRIAXone (ROCEPHIN) 1 g in sodium chloride 0.9 % 100 mL IVPB  1 g Intravenous Q24H Jacques Earthly T, MD      . docusate sodium (COLACE) capsule  100 mg  100 mg Oral BID PRN Gleason, Otilio Carpen, PA-C      . insulin aspart (novoLOG) injection 1-3 Units  1-3 Units Subcutaneous Q4H Milagros Loll, MD   1 Units at 04/16/20 1106  . octreotide (SANDOSTATIN) 500 mcg in sodium chloride 0.9 % 250 mL (2 mcg/mL) infusion  50 mcg/hr Intravenous Continuous Gleason, Otilio Carpen, PA-C 25 mL/hr at 04/16/20 1100 50 mcg/hr at 04/16/20 1100  . pantoprazole (PROTONIX) injection 40 mg  40 mg Intravenous Q12H Jacques Earthly T, MD      . phenylephrine (NEOSYNEPHRINE) 10-0.9 MG/250ML-% infusion  25-200 mcg/min Intravenous Titrated Gleason, Otilio Carpen,  PA-C   Held at 04/16/20 0910  . polyethylene glycol (MIRALAX / GLYCOLAX) packet 17 g  17 g Oral Daily PRN Gleason, Otilio Carpen, PA-C       Labs: Basic Metabolic Panel: Recent Labs  Lab 04/16/20 0229 04/16/20 0259  NA 135 134*  133*  K 6.1* 6.1*  6.0*  CL 101 100  CO2 20*  --   GLUCOSE 373* 352*  BUN 52* 49*  CREATININE 4.32* 4.40*  CALCIUM 8.8*  --    Liver Function Tests: Recent Labs  Lab 04/16/20 0229  AST 27  ALT 16  ALKPHOS 73  BILITOT 0.8  PROT 4.8*  ALBUMIN 2.1*   Recent Labs  Lab 04/16/20 0229  LIPASE 24   CBC: Recent Labs  Lab 04/16/20 0229 04/16/20 0259  WBC 5.0  --   NEUTROABS 2.9  --   HGB 7.0* 6.8*  6.8*  HCT 22.8* 20.0*  20.0*  MCV 86.7  --   PLT 39*  --    CBG: Recent Labs  Lab 04/16/20 0255 04/16/20 1105  GLUCAP 364* 127*   Studies/Results: DG Abdomen 1 View  Result Date: 04/16/2020 CLINICAL DATA:  Vomiting blood.  Recent dialysis EXAM: X-RAY ABDOMEN 1 VIEW COMPARISON:  None. FINDINGS: No dilated loops of large or small bowel. Gas and stool in the rectum. No pathologic calcifications. No organomegaly. No acute osseous abnormality IMPRESSION: No acute abdominal findings. Electronically Signed   By: Suzy Bouchard M.D.   On: 04/16/2020 04:33   DG Chest Port 1 View  Result Date: 04/16/2020 CLINICAL DATA:  Questionable sepsis EXAM: PORTABLE CHEST 1 VIEW COMPARISON:   12/20/2019 FINDINGS: Normal mediastinum and cardiac silhouette. Normal pulmonary vasculature. No evidence of effusion, infiltrate, or pneumothorax. No acute bony abnormality. IMPRESSION: No acute cardiopulmonary process. Electronically Signed   By: Suzy Bouchard M.D.   On: 04/16/2020 04:31    Dialysis Orders:  Center: [facility] on TTS. 4 hrs 0 min, 180NRe Optiflux, BFR 400, DFR Manual 800 mL/min, EDW 68 (kg), Dialysate 2.0 K, 3.5 Ca No heparin Mircera 75 mcg IV q 2 weeks- last dose 9/7 (hgb 9.8 on 9/9) Venofer 36m weekly  Assessment/Plan: 1.  Acute GI bleed: Presented to the ED after a fall with active bloody emesis and bowel movements. Hgb 6.8 with  plt 39. RBC transfusing. Receiving octreotide and platelets per GI. Planned for endoscopy however K+ elevated at 6.1, procedure scheduled for tomorrow.  2. Hyperkalemia: K+ 6.1. EKG without hyperkalemia-associated changes and VSS. Will plan HD tonight and repeat BMP in AM.  3.  ESRD:  On dialysis TTS, last HD yesterday however K+ now elevated at 6.1. Planned for HD tonight as above. VSS, pt does seem stable enough for intermittent hemodialysis at this time. 4.  Hypertension/volume: Hypotensive on arrival, improving with IVF and PRBC. Does not appear volume overloaded on exam and is below his outpatient EDW. No edema on CXR. Will keep UF even with HD tonight.  5.  Metabolic bone disease: Corrected calcium ok. Not on VDRA. On calcium acetate once daily as outpatient, follow phos once tolerating PO.  6.  Nutrition:  Currently NPO  SAnice Paganini PA-C 04/16/2020, 11:21 AM  [facility] Pager: [phone]

## 2020-04-16 NOTE — ED Provider Notes (Signed)
[facility] EMERGENCY DEPARTMENT Provider Note  CSN: [id number] Arrival date & time: 04/16/20 0204  Chief Complaint(s) Abdominal Pain (with vomiting )  HPI William [name] is a 69 y.o. male with extended past medical history listed below including ESRD on dialysis TTS, cirrhosis, diabetes who presents to the emergency department by EMS after they were called out for fall.  Patient speaks [place] and unable to provide history.  Per EMS patient was found to be hypotensive with systolics initially in the 50s.  He was noted to have hematemesis of bright red blood as well as incontinent of bloody stool.  CBG greater than 400.  IO was placed and patient received 500 cc of IV fluids.  Pressure is now in the 70s.  Patient is more alert.  On review of records and noted the patient had an EGD on September 8, that noted esophageal varices that were nonbleeding.  Son arrived at bedside and provided additional history.  He confirmed the EGD.  Reports that the patient has been in his normal state of health up until today.  He went to dialysis and got home around noon.  Around 5:00p patient began to complain of abdominal discomfort after eating, this apparently persisted throughout the night.  The son reports leaving the patient's home around 7 PM but being called at 1 AM by the wife reported that the patient had collapsed in the bathroom.  Currently patient is complaining of abdominal discomfort.  No chest pain or shortness of breath.  He is having active nausea and bright red blood bloody emesis.  He has a large melanotic bowel movement.   Remainder of history, ROS, and physical exam limited due to patient's condition (acuity of condition). Additional information was obtained from EMS and family.   Level V Caveat.    HPI  Past Medical History Past Medical History:  Diagnosis Date  . Anemia   . Blind one eye   . CHF (congestive heart failure) ([place])   . Cirrhosis ([place])   .  Cirrhosis ([place])   . CKD (chronic kidney disease), stage IV ([place])   . Dialysis patient ([place])   . Goodpasture's disease [facility])    on outpatient plasmapheresis/notes 11/20/2017  . Grade I diastolic dysfunction 78/29/5621  . History of anemia due to chronic kidney disease   . History of blood transfusion X 1   "UGIB; low blood count"  . History of plasmapheresis    "qod" (11/20/2017)  . Hypertension   . Pancytopenia ([place]) 08/20/2017  . Type II diabetes mellitus [facility])    Patient Active Problem List   Diagnosis Date Noted  . Hyperkalemia, diminished renal excretion 06/01/2019  . AV fistula thrombosis, initial encounter ([place]) 06/01/2019  . AV fistula occlusion, initial encounter ([place])   . Chronic systolic CHF (congestive heart failure) ([place])   . Encounter for other preprocedural examination 09/23/2018  . Esophageal varices ([place]) 09/23/2018  . Thrombocytopenia ([place]) 09/23/2018  . CHF (congestive heart failure) ([place]) 09/22/2018  . ESRD on dialysis ([place]) 02/06/2018  . CKD (chronic kidney disease) stage 4, GFR 15-29 ml/min ([facility]) 12/19/2017  . Cirrhosis of liver with ascites ([place]) 12/09/2017  . Goodpasture's syndrome ([place]) with associated hemoptysis 11/20/2017  . Acute on chronic diastolic CHF (congestive heart failure) ([place]) 11/06/2017  . Pancytopenia ([place]) 11/06/2017  . GERD (gastroesophageal reflux disease) 11/06/2017  . Essential hypertension 11/06/2017  . Type II diabetes mellitus with renal manifestations ([place]) 08/25/2015   Home Medication(s) Prior to Admission medications  Medication Sig Start Date End Date Taking? Authorizing Provider  acetaminophen (TYLENOL) 650 MG CR tablet Take 650 mg by mouth every 8 (eight) hours as needed for pain.   Yes [provider]  amLODipine (NORVASC) 10 MG tablet Take 5 mg by mouth daily.   Yes [provider]  calcium acetate (PHOSLO) 667 MG capsule Take 2 capsules (1,334 mg total) by mouth 3 (three) times daily with meals. Patient  taking differently: Take 667 mg by mouth 3 (three) times daily with meals.  01/26/18  Yes Purohit, Konrad Dolores, MD  carvedilol (COREG) 12.5 MG tablet Take 25 mg by mouth 2 (two) times daily. 03/29/20  Yes [provider]  cetirizine (ZYRTEC) 5 MG tablet Take 1 tablet (5 mg total) by mouth 3 (three) times a week. Before dialysis 01/07/20  Yes Rutherford Guys, MD  doxycycline (VIBRA-TABS) 100 MG tablet Take 1 tablet (100 mg total) by mouth 2 (two) times daily. 04/03/20  Yes Trula Slade, DPM  insulin degludec (TRESIBA FLEXTOUCH) 100 UNIT/ML SOPN FlexTouch Pen Inject 0.14 mLs (14 Units total) into the skin daily. Inject 14 units under the skin once daily. Patient taking differently: Inject 12 Units into the skin daily.  03/02/19  Yes Elayne Snare, MD  insulin regular (NOVOLIN R,HUMULIN R) 100 units/mL injection Inject 8-10 Units into the skin See admin instructions. INJECT 8 UNITS UNDER THE SKIN AT BREAKFAST, 10 UNITS AT LUNCH AND 8 UNITS AT DINNER.   Yes [provider]  lidocaine-prilocaine (EMLA) cream Apply 1 application topically Every Tuesday,Thursday,and Saturday with dialysis. 08/24/18  Yes [provider]  loperamide (IMODIUM A-D) 2 MG tablet Take 1 tablet (2 mg total) by mouth 4 (four) times daily as needed for diarrhea or loose stools. 07/19/19  Yes Maximiano Coss, NP  Multiple Vitamins-Minerals (MULTIVITAMIN WITH MINERALS) tablet Take 1 tablet by mouth daily.   Yes [provider]  mupirocin ointment (BACTROBAN) 2 % Apply 1 application topically 2 (two) times daily. 04/03/20  Yes Trula Slade, DPM  ondansetron (ZOFRAN ODT) 4 MG disintegrating tablet Take 1 tablet (4 mg total) by mouth every 8 (eight) hours as needed for nausea or vomiting. Dissolves in mouth 08/09/19  Yes [name], [employer] C, PA-C  Polyethyl Glycol-Propyl Glycol (SYSTANE) 0.4-0.3 % SOLN Place 1 drop into both eyes 3 (three) times daily as needed (for dry eyes).   Yes [provider]    rosuvastatin (CRESTOR) 5 MG tablet Take 1 tablet (5 mg total) by mouth daily. 03/17/19  Yes Elayne Snare, MD  glucose blood (ONETOUCH VERIO) test strip USE 1 STRIP TO CHECK GLUCOSE THREE TIMES DAILY. DX:E11.65 01/31/20   Elayne Snare, MD  Insulin Pen Needle (ADVOCATE INSULIN PEN NEEDLES) 31G X 5 MM MISC Use four times daily to inject insulin 04/27/18   Elayne Snare, MD  ofloxacin (FLOXIN) 0.3 % OTIC solution Place 5 drops into the right ear daily. Patient not taking: Reported on 04/16/2020 08/09/19   [name], Elesa Hacker, PA-C  ONE TOUCH LANCETS MISC Use to test blood sugar three times daily 04/27/18   Elayne Snare, MD  Past Surgical History Past Surgical History:  Procedure Laterality Date  . AV FISTULA PLACEMENT Left 12/26/2017   Procedure: LEFT BRACHIOCEPHALIC ARTERIOVENOUS (AV) FISTULA CREATION;  Surgeon: Conrad Nesika Beach, MD;  Location: [facility];  Service: Vascular;  Laterality: Left;  . BASCILIC VEIN TRANSPOSITION Left 02/16/2018   Procedure: BRACHIOBASILIC VEIN TRANSPOSITION SECOND STAGE;  Surgeon: Conrad Gadsden, MD;  Location: [facility];  Service: Vascular;  Laterality: Left;  . CATARACT EXTRACTION W/ INTRAOCULAR LENS  IMPLANT, BILATERAL Bilateral   . ESOPHAGEAL BANDING N/A 04/01/2018   Procedure: ESOPHAGEAL BANDING;  Surgeon: Otis Brace, MD;  Location: [facility];  Service: Gastroenterology;  Laterality: N/A;  . ESOPHAGEAL BANDING N/A 06/29/2018   Procedure: ESOPHAGEAL BANDING;  Surgeon: Otis Brace, MD;  Location: [facility];  Service: Gastroenterology;  Laterality: N/A;  . ESOPHAGOGASTRODUODENOSCOPY (EGD) WITH PROPOFOL N/A 04/01/2018   Procedure: ESOPHAGOGASTRODUODENOSCOPY (EGD) WITH PROPOFOL;  Surgeon: Otis Brace, MD;  [facility];  Service: Gastroenterology;  Laterality: N/A;  . ESOPHAGOGASTRODUODENOSCOPY (EGD) WITH PROPOFOL N/A 06/29/2018    Procedure: ESOPHAGOGASTRODUODENOSCOPY (EGD) WITH PROPOFOL;  Surgeon: Otis Brace, MD;  [facility];  Service: Gastroenterology;  Laterality: N/A;  . ESOPHAGOGASTRODUODENOSCOPY (EGD) WITH PROPOFOL N/A 09/30/2018   Procedure: ESOPHAGOGASTRODUODENOSCOPY (EGD) WITH PROPOFOL;  Surgeon: Otis Brace, MD;  [facility];  Service: Gastroenterology;  Laterality: N/A;  . ESOPHAGOGASTRODUODENOSCOPY (EGD) WITH PROPOFOL N/A 04/20/2019   Procedure: ESOPHAGOGASTRODUODENOSCOPY (EGD) WITH PROPOFOL;  Surgeon: Otis Brace, MD;  [facility];  Service: Gastroenterology;  Laterality: N/A;  . I & D EXTREMITY Left 02/18/2018   Procedure: IRRIGATION AND DEBRIDEMENT LEFT ARM;  Surgeon: Serafina Mitchell, MD;  [facility];  Service: Vascular;  Laterality: Left;  . INSERTION OF DIALYSIS CATHETER N/A 01/19/2018   Procedure: INSERTION OF DIALYSIS CATHETER - RIGHT INTERNAL JUGULAR PLACEMENT;  Surgeon: Rosetta Posner, MD;  Location: [facility];  Service: Vascular;  Laterality: N/A;  . IR FLUORO GUIDE CV LINE RIGHT  11/10/2017  . IR PARACENTESIS  12/10/2017  . IR PARACENTESIS  12/26/2017  . IR REMOVAL TUN CV CATH W/O FL  11/28/2017  . IR THROMBECTOMY AV FISTULA W/THROMBOLYSIS/PTA INC/SHUNT/IMG LEFT Left 06/02/2019  . IR US GUIDE VASC ACCESS LEFT  06/02/2019  . IR US GUIDE VASC ACCESS RIGHT  11/10/2017  . NECK SURGERY  2007   "back of neck; no hardware in there"   Family History Family History  Problem Relation Age of Onset  . Diabetes Mellitus II Sister   . Diabetes Sister   . Stroke Brother   . Heart attack Brother     Social History Social History   Tobacco Use  . Smoking status: Never Smoker  . Smokeless tobacco: Never Used  Vaping Use  . Vaping Use: Never used  Substance Use Topics  . Alcohol use: Never  . Drug use: Never   Allergies Heparin and Pork-derived products  Review of Systems Review of Systems  Unable to perform ROS: Acuity of condition    Physical  Exam Vital Signs  I have reviewed the triage vital signs BP (!) 100/43   Pulse 75   Temp 97.7 F (36.5 C) (Axillary)   Resp 16   Ht 5' 5"  (1.651 m)   Wt 66 kg   SpO2 100%   BMI 24.21 kg/m   Physical Exam Vitals reviewed.  Constitutional:      General: He is not in acute distress.    Appearance: He is well-developed. He is not diaphoretic.  HENT:  Head: Normocephalic and atraumatic.     Nose: Nose normal.  Eyes:     General: No scleral icterus.       Right eye: No discharge.        Left eye: No discharge.     Conjunctiva/sclera: Conjunctivae normal.     Pupils: Pupils are equal, round, and reactive to light.  Cardiovascular:     Rate and Rhythm: Normal rate and regular rhythm.     Heart sounds: No murmur heard.  No friction rub. No gallop.   Pulmonary:     Effort: Pulmonary effort is normal. No respiratory distress.     Breath sounds: Normal breath sounds. No stridor. No rales.  Abdominal:     General: There is no distension.     Palpations: Abdomen is soft.     Tenderness: There is no abdominal tenderness. There is no guarding or rebound.  Genitourinary:    Comments: Melenic stool; hemoccult + Musculoskeletal:        General: No tenderness.     Cervical back: Normal range of motion and neck supple.  Skin:    General: Skin is warm and dry.     Findings: No erythema or rash.  Neurological:     Mental Status: He is alert and oriented to person, place, and time.     ED Results and Treatments Labs (all labs ordered are listed, but only abnormal results are displayed) Labs Reviewed  LACTIC ACID, PLASMA - Abnormal; Notable for the following components:      Result Value   Lactic Acid, Venous 5.8 (*)    All other components within normal limits  LACTIC ACID, PLASMA - Abnormal; Notable for the following components:   Lactic Acid, Venous 2.9 (*)    All other components within normal limits  COMPREHENSIVE METABOLIC PANEL - Abnormal; Notable for the following  components:   Potassium 6.1 (*)    CO2 20 (*)    Glucose, Bld 373 (*)    BUN 52 (*)    Creatinine, Ser 4.32 (*)    Calcium 8.8 (*)    Total Protein 4.8 (*)    Albumin 2.1 (*)    GFR calc non Af Amer 13 (*)    GFR calc Af Amer 15 (*)    All other components within normal limits  CBC WITH DIFFERENTIAL/PLATELET - Abnormal; Notable for the following components:   RBC 2.63 (*)    Hemoglobin 7.0 (*)    HCT 22.8 (*)    Platelets 39 (*)    nRBC 0.6 (*)    All other components within normal limits  PROTIME-INR - Abnormal; Notable for the following components:   Prothrombin Time 18.3 (*)    INR 1.6 (*)    All other components within normal limits  APTT - Abnormal; Notable for the following components:   aPTT 20 (*)    All other components within normal limits  I-STAT VENOUS BLOOD GAS, ED - Abnormal; Notable for the following components:   pH, Ven 7.559 (*)    pCO2, Ven 26.4 (*)    pO2, Ven 140.0 (*)    Sodium 134 (*)    Potassium 6.1 (*)    Calcium, Ion 1.03 (*)    HCT 20.0 (*)    Hemoglobin 6.8 (*)    All other components within normal limits  CBG MONITORING, ED - Abnormal; Notable for the following components:   Glucose-Capillary 364 (*)    All other components within normal limits  I-STAT CHEM 8, ED - Abnormal; Notable for the following components:   Sodium 133 (*)    Potassium 6.0 (*)    BUN 49 (*)    Creatinine, Ser 4.40 (*)    Glucose, Bld 352 (*)    Calcium, Ion 1.04 (*)    Hemoglobin 6.8 (*)    HCT 20.0 (*)    All other components within normal limits  POC OCCULT BLOOD, ED - Abnormal; Notable for the following components:   Fecal Occult Bld POSITIVE (*)    All other components within normal limits  SARS CORONAVIRUS 2 BY RT PCR (HOSPITAL ORDER, [facility])  CULTURE, BLOOD (SINGLE)  URINE CULTURE  LIPASE, BLOOD  URINALYSIS, ROUTINE W REFLEX MICROSCOPIC  TYPE AND SCREEN  PREPARE RBC (CROSSMATCH)  PREPARE PLATELET PHERESIS                                                                                                                          EKG  EKG Interpretation  Date/Time:  Sunday April 16 2020 02:19:39 EDT Ventricular Rate:  80 PR Interval:    QRS Duration: 92 QT Interval:  389 QTC Calculation: 449 R Axis:   36 Text Interpretation: Sinus rhythm Minimal ST depression, inferior leads Minimal ST elevation, lateral leads Confirmed by Addison Lank [id number]) on 04/16/2020 2:32:06 AM      Radiology DG Abdomen 1 View  Result Date: 04/16/2020 CLINICAL DATA:  Vomiting blood.  Recent dialysis EXAM: X-RAY ABDOMEN 1 VIEW COMPARISON:  None. FINDINGS: No dilated loops of large or small bowel. Gas and stool in the rectum. No pathologic calcifications. No organomegaly. No acute osseous abnormality IMPRESSION: No acute abdominal findings. Electronically Signed   By: Suzy Bouchard M.D.   On: 04/16/2020 04:33   DG Chest Port 1 View  Result Date: 04/16/2020 CLINICAL DATA:  Questionable sepsis EXAM: PORTABLE CHEST 1 VIEW COMPARISON:  12/20/2019 FINDINGS: Normal mediastinum and cardiac silhouette. Normal pulmonary vasculature. No evidence of effusion, infiltrate, or pneumothorax. No acute bony abnormality. IMPRESSION: No acute cardiopulmonary process. Electronically Signed   By: Suzy Bouchard M.D.   On: 04/16/2020 04:31    Pertinent labs & imaging results that were available during my care of the patient were reviewed by me and considered in my medical decision making (see chart for details).  Medications Ordered in ED Medications  sodium chloride 0.9 % bolus 1,000 mL (0 mLs Intravenous Stopped 04/16/20 0538)    Followed by  0.9 %  sodium chloride infusion (1,000 mLs Intravenous New Bag/Given 04/16/20 0504)  octreotide (SANDOSTATIN) 2 mcg/mL load via infusion 50 mcg (has no administration in time range)    And  octreotide (SANDOSTATIN) 500 mcg in sodium chloride 0.9 % 250 mL (2 mcg/mL) infusion (has no administration in time  range)  0.9 %  sodium chloride infusion (Manually program via Guardrails IV Fluids) (has no administration in time range)  0.9 %  sodium chloride infusion (has no administration  in time range)  phenylephrine (NEOSYNEPHRINE) 10-0.9 MG/250ML-% infusion (has no administration in time range)  lactated ringers bolus 1,000 mL (0 mLs Intravenous Stopped 04/16/20 0406)  cefTRIAXone (ROCEPHIN) 1 g in sodium chloride 0.9 % 100 mL IVPB (0 g Intravenous Stopped 04/16/20 0418)  pantoprazole (PROTONIX) injection 40 mg (40 mg Intravenous Given 04/16/20 0334)  metoCLOPramide (REGLAN) injection 5 mg (5 mg Intravenous Given 04/16/20 0332)  ondansetron (ZOFRAN) injection 4 mg (4 mg Intravenous Given 04/16/20 0242)  insulin aspart (novoLOG) injection 6 Units (6 Units Intravenous Given 04/16/20 0430)  0.9 %  sodium chloride infusion (Manually program via Guardrails IV Fluids) ( Intravenous New Bag/Given 04/16/20 0506)                                                                                                                                    Procedures .1-3 Lead EKG Interpretation Performed by: Fatima Blank, MD Authorized by: Fatima Blank, MD     Interpretation: normal     ECG rate:  76   ECG rate assessment: normal     Rhythm: sinus rhythm     Ectopy: none     Conduction: normal   Ultrasound ED Peripheral IV (Provider)  Date/Time: 04/16/2020 3:24 AM Performed by: Fatima Blank, MD Authorized by: Fatima Blank, MD   Procedure details:    Indications: hypotension     Skin Prep: chlorhexidine gluconate     Location:  Right AC   Angiocath:  20 G   Bedside Ultrasound Guided: Yes     Images: archived     Patient tolerated procedure without complications: Yes     Dressing applied: Yes   .Critical Care Performed by: Fatima Blank, MD Authorized by: Fatima Blank, MD    CRITICAL CARE Performed by: Grayce Sessions Tayon Parekh Total critical care time: 78  minutes Critical care time was exclusive of separately billable procedures and treating other patients. Critical care was necessary to treat or prevent imminent or life-threatening deterioration. Critical care was time spent personally by me on the following activities: development of treatment plan with patient and/or surrogate as well as nursing, discussions with consultants, evaluation of patient's response to treatment, examination of patient, obtaining history from patient or surrogate, ordering and performing treatments and interventions, ordering and review of laboratory studies, ordering and review of radiographic studies, pulse oximetry and re-evaluation of patient's condition.    (including critical care time)  Medical Decision Making / ED Course I have reviewed the nursing notes for this encounter and the patient's prior records (if available in EHR or on provided paperwork).   Lotus Montfort was evaluated in Emergency Department on 04/16/2020 for the symptoms described in the history of present illness. He was evaluated in the context of the global COVID-19 pandemic, which necessitated consideration that the patient might be at risk for infection with the SARS-CoV-2 virus that causes COVID-19. Institutional protocols and  algorithms that pertain to the evaluation of patients at risk for COVID-19 are in a state of rapid change based on information released by regulatory bodies including the CDC and federal and state organizations. These policies and algorithms were followed during the patient's care in the ED.  Patient presented for hypotension from upper GI bleed.  Hb down to 6.9 from 11.1 three weeks ago. Has thrombocytopenia as well Mild hyperkalemia. Hyperglycemia w/o DKA. No evidence of biliary obstruction or pancreatitis.  Given 2L IVF protonix and rocephin for GI bleed. 2U pRBC ordered for anemia.  Spoke with Dr. Therisa Doyne from [facility] who recommended 1U Plts and octreotide.  Pressors if needed. They will scope in the morning.  Pressures still tenuous. Pressors ordered. Spoke with ICU team who will admit for further work up and management.        Final Clinical Impression(s) / ED Diagnoses Final diagnoses:  Acute upper GI bleeding  Acute blood loss anemia  Hypovolemic shock ([place])      This chart was dictated using voice recognition software.  Despite best efforts to proofread,  errors can occur which can change the documentation meaning.   Fatima Blank, MD 04/16/20 [phone]

## 2020-04-16 NOTE — Anesthesia Preprocedure Evaluation (Addendum)
Anesthesia Evaluation  Patient identified by MRN, date of birth, ID band Patient awake    Reviewed: Allergy & Precautions, NPO status , Patient's Chart, lab work & pertinent test results  Airway Mallampati: II  TM Distance: >3 FB Neck ROM: Full    Dental  (+) Dental Advisory Given, Poor Dentition, Missing, Edentulous Upper,    Pulmonary neg pulmonary ROS,  Positive for COVID 3 months ago now negative   Pulmonary exam normal breath sounds clear to auscultation       Cardiovascular hypertension, +CHF  Normal cardiovascular exam Rhythm:Regular Rate:Normal  TTE 2019 EF 65-70%, G1DD, no significant valvular abnormalities   Neuro/Psych negative neurological ROS  negative psych ROS   GI/Hepatic GERD  ,(+) Cirrhosis       ,   Endo/Other  negative endocrine ROSdiabetes, Insulin Dependent  Renal/GU ESRF and DialysisRenal disease  negative genitourinary   Musculoskeletal negative musculoskeletal ROS (+)   Abdominal   Peds  Hematology  (+) Blood dyscrasia, anemia ,   Anesthesia Other Findings EGD for h/o esophageal varices  Reproductive/Obstetrics                            Anesthesia Physical  Anesthesia Plan  ASA: III  Anesthesia Plan: MAC   Post-op Pain Management:    Induction: Intravenous  PONV Risk Score and Plan: 1 and Propofol infusion, Treatment may vary due to age or medical condition and TIVA  Airway Management Planned: Natural Airway  Additional Equipment:   Intra-op Plan:   Post-operative Plan:   Informed Consent: I have reviewed the patients History and Physical, chart, labs and discussed the procedure including the risks, benefits and alternatives for the proposed anesthesia with the patient or authorized representative who has indicated his/her understanding and acceptance.     Dental advisory given  Plan Discussed with: CRNA  Anesthesia Plan Comments:         Anesthesia Quick Evaluation

## 2020-04-16 NOTE — Consult Note (Signed)
[facility] Consult  Referring Provider: ER Primary Care Physician:  Horald Pollen, MD Primary Gastroenterologist: Dr.Brahmbhatt  Reason for Consultation: Spitting up blood  HPI: William [name] is a 69 y.o. male with past medical history of cirrhosis, being evaluated for liver transplant at [facility], last EGD at [facility] on 04/12/2020 which showed small esophageal varices and gastric varices, end-stage renal disease on dialysis(last dialysis yesterday), diabetes presented to the ER with complaints of spitting/vomiting bright red blood. Patient is Arabic and does not speak [place], his son is present at bedside for translation who states that he was in his usual state of health until yesterday when he started feeling nauseous and spitting small quantities of bright red blood. As per documentation he was found to be hypotensive with systolic blood pressure in 50s, was incontinent of bloody stool, blood sugar was over 400, and intraosseous line was placed, patient was given 500 cc of IV fluid bolus and was brought to the ED.  So far in the ER patient has received 1 unit of platelet, is being transfused 1 unit of PRBC with plans to transfuse second unit of PRBC subsequently. He is not tachycardic or hypotensive currently. He denies nausea, vomiting, abdominal discomfort. He has not had a bowel movement since presentation to the ER. He has been given IV ceftriaxone, received bolus of Sandostatin, is continued on Sandostatin drip and Protonix 40 mg IV every 12 hours.  Past Medical History:  Diagnosis Date   Anemia    Blind one eye    CHF (congestive heart failure) (HCC)    Cirrhosis (HCC)    Cirrhosis (HCC)    CKD (chronic kidney disease), stage IV ([place])    Dialysis patient ([place])    Goodpasture's disease ([place])    on outpatient plasmapheresis/notes 11/20/2017   Grade I diastolic dysfunction 00/37/0488   History of anemia due to chronic kidney disease    History of blood  transfusion X 1   "UGIB; low blood count"   History of plasmapheresis    "qod" (11/20/2017)   Hypertension    Pancytopenia ([place]) 08/20/2017   Type II diabetes mellitus ([place])     Past Surgical History:  Procedure Laterality Date   AV FISTULA PLACEMENT Left 12/26/2017   Procedure: LEFT BRACHIOCEPHALIC ARTERIOVENOUS (AV) FISTULA CREATION;  Surgeon: Conrad Ronceverte, MD;  Location: [place];  Service: Vascular;  Laterality: Left;   BASCILIC VEIN TRANSPOSITION Left 02/16/2018   Procedure: BRACHIOBASILIC VEIN TRANSPOSITION SECOND STAGE;  Surgeon: Conrad Darden, MD;  Location: [facility] OR;  Service: Vascular;  Laterality: Left;   CATARACT EXTRACTION W/ INTRAOCULAR LENS  IMPLANT, BILATERAL Bilateral    ESOPHAGEAL BANDING N/A 04/01/2018   Procedure: ESOPHAGEAL BANDING;  Surgeon: Otis Brace, MD;  Location: [facility];  Service: Gastroenterology;  Laterality: N/A;   ESOPHAGEAL BANDING N/A 06/29/2018   Procedure: ESOPHAGEAL BANDING;  Surgeon: Otis Brace, MD;  Location: [facility];  Service: Gastroenterology;  Laterality: N/A;   ESOPHAGOGASTRODUODENOSCOPY (EGD) WITH PROPOFOL N/A 04/01/2018   Procedure: ESOPHAGOGASTRODUODENOSCOPY (EGD) WITH PROPOFOL;  Surgeon: Otis Brace, MD;  Location: [facility];  Service: Gastroenterology;  Laterality: N/A;   ESOPHAGOGASTRODUODENOSCOPY (EGD) WITH PROPOFOL N/A 06/29/2018   Procedure: ESOPHAGOGASTRODUODENOSCOPY (EGD) WITH PROPOFOL;  Surgeon: Otis Brace, MD;  Location: [facility];  Service: Gastroenterology;  Laterality: N/A;   ESOPHAGOGASTRODUODENOSCOPY (EGD) WITH PROPOFOL N/A 09/30/2018   Procedure: ESOPHAGOGASTRODUODENOSCOPY (EGD) WITH PROPOFOL;  Surgeon: Otis Brace, MD;  Location: [facility];  Service: Gastroenterology;  Laterality: N/A;   ESOPHAGOGASTRODUODENOSCOPY (EGD) WITH PROPOFOL  N/A 04/20/2019   Procedure: ESOPHAGOGASTRODUODENOSCOPY (EGD) WITH PROPOFOL;  Surgeon: Otis Brace, MD;  Location: [facility];  Service:  Gastroenterology;  Laterality: N/A;   I & D EXTREMITY Left 02/18/2018   Procedure: IRRIGATION AND DEBRIDEMENT LEFT ARM;  Surgeon: Serafina Mitchell, MD;  Location: [place] OR;  Service: Vascular;  Laterality: Left;   INSERTION OF DIALYSIS CATHETER N/A 01/19/2018   Procedure: INSERTION OF DIALYSIS CATHETER - RIGHT INTERNAL JUGULAR PLACEMENT;  Surgeon: Rosetta Posner, MD;  Location: [place];  Service: Vascular;  Laterality: N/A;   IR FLUORO GUIDE CV LINE RIGHT  11/10/2017   IR PARACENTESIS  12/10/2017   IR PARACENTESIS  12/26/2017   IR REMOVAL TUN CV CATH W/O FL  11/28/2017   IR THROMBECTOMY AV FISTULA W/THROMBOLYSIS/PTA INC/SHUNT/IMG LEFT Left 06/02/2019   IR US GUIDE VASC ACCESS LEFT  06/02/2019   IR US GUIDE VASC ACCESS RIGHT  11/10/2017   NECK SURGERY  2007   "back of neck; no hardware in there"    Prior to Admission medications   Medication Sig Start Date End Date Taking? Authorizing Provider  acetaminophen (TYLENOL) 650 MG CR tablet Take 650 mg by mouth every 8 (eight) hours as needed for pain.   Yes [provider]  amLODipine (NORVASC) 10 MG tablet Take 5 mg by mouth daily.   Yes [provider]  calcium acetate (PHOSLO) 667 MG capsule Take 2 capsules (1,334 mg total) by mouth 3 (three) times daily with meals. Patient taking differently: Take 667 mg by mouth 3 (three) times daily with meals.  01/26/18  Yes Purohit, Konrad Dolores, MD  carvedilol (COREG) 12.5 MG tablet Take 25 mg by mouth 2 (two) times daily. 03/29/20  Yes [provider]  cetirizine (ZYRTEC) 5 MG tablet Take 1 tablet (5 mg total) by mouth 3 (three) times a week. Before dialysis 01/07/20  Yes Rutherford Guys, MD  doxycycline (VIBRA-TABS) 100 MG tablet Take 1 tablet (100 mg total) by mouth 2 (two) times daily. 04/03/20  Yes Trula Slade, DPM  insulin degludec (TRESIBA FLEXTOUCH) 100 UNIT/ML SOPN FlexTouch Pen Inject 0.14 mLs (14 Units total) into the skin daily. Inject 14 units under the skin once  daily. Patient taking differently: Inject 12 Units into the skin daily.  03/02/19  Yes Elayne Snare, MD  insulin regular (NOVOLIN R,HUMULIN R) 100 units/mL injection Inject 8-10 Units into the skin See admin instructions. INJECT 8 UNITS UNDER THE SKIN AT BREAKFAST, 10 UNITS AT LUNCH AND 8 UNITS AT DINNER.   Yes [provider]  lidocaine-prilocaine (EMLA) cream Apply 1 application topically Every Tuesday,Thursday,and Saturday with dialysis. 08/24/18  Yes [provider]  loperamide (IMODIUM A-D) 2 MG tablet Take 1 tablet (2 mg total) by mouth 4 (four) times daily as needed for diarrhea or loose stools. 07/19/19  Yes Maximiano Coss, NP  Multiple Vitamins-Minerals (MULTIVITAMIN WITH MINERALS) tablet Take 1 tablet by mouth daily.   Yes [provider]  mupirocin ointment (BACTROBAN) 2 % Apply 1 application topically 2 (two) times daily. 04/03/20  Yes Trula Slade, DPM  ondansetron (ZOFRAN ODT) 4 MG disintegrating tablet Take 1 tablet (4 mg total) by mouth every 8 (eight) hours as needed for nausea or vomiting. Dissolves in mouth 08/09/19  Yes [name], [employer] C, PA-C  Polyethyl Glycol-Propyl Glycol (SYSTANE) 0.4-0.3 % SOLN Place 1 drop into both eyes 3 (three) times daily as needed (for dry eyes).   Yes [provider]  rosuvastatin (CRESTOR) 5 MG tablet  Take 1 tablet (5 mg total) by mouth daily. 03/17/19  Yes Elayne Snare, MD  glucose blood (ONETOUCH VERIO) test strip USE 1 STRIP TO CHECK GLUCOSE THREE TIMES DAILY. DX:E11.65 01/31/20   Elayne Snare, MD  Insulin Pen Needle (ADVOCATE INSULIN PEN NEEDLES) 31G X 5 MM MISC Use four times daily to inject insulin 04/27/18   Elayne Snare, MD  ofloxacin (FLOXIN) 0.3 % OTIC solution Place 5 drops into the right ear daily. Patient not taking: Reported on 04/16/2020 08/09/19   [name], Elesa Hacker, PA-C  ONE TOUCH LANCETS MISC Use to test blood sugar three times daily 04/27/18   Elayne Snare, MD    Current Facility-Administered Medications   Medication Dose Route Frequency Provider Last Rate Last Admin   0.9 %  sodium chloride infusion  1,000 mL Intravenous Continuous Gleason, Otilio Carpen, PA-C 125 mL/hr at 04/16/20 0504 1,000 mL at 04/16/20 0504   0.9 %  sodium chloride infusion  250 mL Intravenous Continuous Gleason, Otilio Carpen, PA-C       cefTRIAXone (ROCEPHIN) 1 g in sodium chloride 0.9 % 100 mL IVPB  1 g Intravenous Q24H Milagros Loll, MD       docusate sodium (COLACE) capsule 100 mg  100 mg Oral BID PRN Gleason, Otilio Carpen, PA-C       octreotide (SANDOSTATIN) 500 mcg in sodium chloride 0.9 % 250 mL (2 mcg/mL) infusion  50 mcg/hr Intravenous Continuous Gleason, Otilio Carpen, PA-C 25 mL/hr at 04/16/20 0644 50 mcg/hr at 04/16/20 0644   pantoprazole (PROTONIX) injection 40 mg  40 mg Intravenous Q12H Jacques Earthly T, MD       phenylephrine (NEOSYNEPHRINE) 10-0.9 MG/250ML-% infusion  25-200 mcg/min Intravenous Titrated Gleason, Otilio Carpen, PA-C       polyethylene glycol (MIRALAX / GLYCOLAX) packet 17 g  17 g Oral Daily PRN Gleason, Otilio Carpen, PA-C       Current Outpatient Medications  Medication Sig Dispense Refill   acetaminophen (TYLENOL) 650 MG CR tablet Take 650 mg by mouth every 8 (eight) hours as needed for pain.     amLODipine (NORVASC) 10 MG tablet Take 5 mg by mouth daily.     calcium acetate (PHOSLO) 667 MG capsule Take 2 capsules (1,334 mg total) by mouth 3 (three) times daily with meals. (Patient taking differently: Take 667 mg by mouth 3 (three) times daily with meals. ) 90 capsule 1   carvedilol (COREG) 12.5 MG tablet Take 25 mg by mouth 2 (two) times daily.     cetirizine (ZYRTEC) 5 MG tablet Take 1 tablet (5 mg total) by mouth 3 (three) times a week. Before dialysis 90 tablet 0   doxycycline (VIBRA-TABS) 100 MG tablet Take 1 tablet (100 mg total) by mouth 2 (two) times daily. 20 tablet 0   insulin degludec (TRESIBA FLEXTOUCH) 100 UNIT/ML SOPN FlexTouch Pen Inject 0.14 mLs (14 Units total) into the skin daily. Inject  14 units under the skin once daily. (Patient taking differently: Inject 12 Units into the skin daily. ) 2 pen 3   insulin regular (NOVOLIN R,HUMULIN R) 100 units/mL injection Inject 8-10 Units into the skin See admin instructions. INJECT 8 UNITS UNDER THE SKIN AT BREAKFAST, 10 UNITS AT LUNCH AND 8 UNITS AT DINNER.     lidocaine-prilocaine (EMLA) cream Apply 1 application topically Every Tuesday,Thursday,and Saturday with dialysis.     loperamide (IMODIUM A-D) 2 MG tablet Take 1 tablet (2 mg total) by mouth 4 (four) times daily as needed for diarrhea or  loose stools. 30 tablet 0   Multiple Vitamins-Minerals (MULTIVITAMIN WITH MINERALS) tablet Take 1 tablet by mouth daily.     mupirocin ointment (BACTROBAN) 2 % Apply 1 application topically 2 (two) times daily. 30 g 2   ondansetron (ZOFRAN ODT) 4 MG disintegrating tablet Take 1 tablet (4 mg total) by mouth every 8 (eight) hours as needed for nausea or vomiting. Dissolves in mouth 20 tablet 0   Polyethyl Glycol-Propyl Glycol (SYSTANE) 0.4-0.3 % SOLN Place 1 drop into both eyes 3 (three) times daily as needed (for dry eyes).     rosuvastatin (CRESTOR) 5 MG tablet Take 1 tablet (5 mg total) by mouth daily. 90 tablet 3   glucose blood (ONETOUCH VERIO) test strip USE 1 STRIP TO CHECK GLUCOSE THREE TIMES DAILY. DX:E11.65 150 each 0   Insulin Pen Needle (ADVOCATE INSULIN PEN NEEDLES) 31G X 5 MM MISC Use four times daily to inject insulin 150 each 1   ofloxacin (FLOXIN) 0.3 % OTIC solution Place 5 drops into the right ear daily. (Patient not taking: Reported on 04/16/2020) 5 mL 0   ONE TOUCH LANCETS MISC Use to test blood sugar three times daily 100 each 2    Allergies as of 04/16/2020 - Review Complete 04/16/2020  Allergen Reaction Noted   Heparin Other (See Comments) 11/11/2017   Pork-derived products Other (See Comments) 02/18/2018    Family History  Problem Relation Age of Onset   Diabetes Mellitus II Sister    Diabetes Sister     Stroke Brother    Heart attack Brother     Social History   Socioeconomic History   Marital status: Married    Spouse name: Not on file   Number of children: Not on file   Years of education: Not on file   Highest education level: Not on file  Occupational History   Not on file  Tobacco Use   Smoking status: Never Smoker   Smokeless tobacco: Never Used  Vaping Use   Vaping Use: Never used  Substance and Sexual Activity   Alcohol use: Never   Drug use: Never   Sexual activity: Not on file  Other Topics Concern   Not on file  Social History Narrative   Not on file   Social Determinants of Health   Financial Resource Strain:    Difficulty of Paying Living Expenses: Not on file  Food Insecurity:    Worried About Charity fundraiser in the Last Year: Not on file   [employer] of Food in the Last Year: Not on file  Transportation Needs:    Lack of Transportation (Medical): Not on file   Lack of Transportation (Non-Medical): Not on file  Physical Activity:    Days of Exercise per Week: Not on file   Minutes of Exercise per Session: Not on file  Stress:    Feeling of Stress : Not on file  Social Connections:    Frequency of Communication with Friends and Family: Not on file   Frequency of Social Gatherings with Friends and Family: Not on file   Attends Religious Services: Not on file   Active Member of Clubs or Organizations: Not on file   Attends Archivist Meetings: Not on file   Marital Status: Not on file  Intimate Partner Violence:    Fear of Current or Ex-Partner: Not on file   Emotionally Abused: Not on file   Physically Abused: Not on file   Sexually Abused: Not on file  Review of Systems: Positive for: GI: Described in detail in HPI.    Gen: Denies any fever, chills, rigors, night sweats, anorexia, fatigue, weakness, malaise, involuntary weight loss, and sleep disorder CV: Denies chest pain, angina, palpitations,  syncope, orthopnea, PND, peripheral edema, and claudication. Resp: Denies dyspnea, cough, sputum, wheezing, coughing up blood. GU : Denies urinary burning, blood in urine, urinary frequency, urinary hesitancy, nocturnal urination, and urinary incontinence. MS: Denies joint pain or swelling.  Denies muscle weakness, cramps, atrophy.  Derm: Denies rash, itching, oral ulcerations, hives, unhealing ulcers.  Psych: Denies depression, anxiety, memory loss, suicidal ideation, hallucinations,  and confusion. Heme: Denies bruising and enlarged lymph nodes. Neuro:  Denies any headaches, dizziness, paresthesias. Endo:  DM, denies any problems with  thyroid, adrenal function.  Physical Exam: Vital signs in last 24 hours: Temp:  [97.5 F (36.4 C)-97.8 F (36.6 C)] 97.7 F (36.5 C) (09/12 3825) Pulse Rate:  [75-81] 78 (09/12 0638) Resp:  [14-26] 17 (09/12 0645) BP: (80-127)/(40-94) 121/52 (09/12 0645) SpO2:  [100 %] 100 % (09/12 0539) Weight:  [66 kg] 66 kg (09/12 0515)    General:   Alert,  Well-developed, well-nourished, pleasant and cooperative in NAD Head:  Normocephalic and atraumatic. Eyes: Mild pallor, no icterus Ears:  Normal auditory acuity. Nose:  No deformity, discharge,  or lesions. Mouth:  No deformity or lesions.  Oropharynx pink & moist. Neck:  Supple; no masses or thyromegaly. Lungs:  Clear throughout to auscultation.   No wheezes, crackles, or rhonchi. No acute distress. Heart:  Regular rate and rhythm; no murmurs, clicks, rubs,  or gallops. Extremities: Left arm fistula. Neurologic:  Alert and  oriented x4;  grossly normal neurologically. Skin:  Intact without significant lesions or rashes. Psych:  Alert and cooperative. Normal mood and affect. Abdomen:  Soft, nontender and nondistended.  Tympanic on percussion  Lab Results: Recent Labs    04/16/20 0229 04/16/20 0259  WBC 5.0  --   HGB 7.0* 6.8*   6.8*  HCT 22.8* 20.0*   20.0*  PLT 39*  --    BMET Recent Labs     04/16/20 0229 04/16/20 0259  NA 135 134*   133*  K 6.1* 6.1*   6.0*  CL 101 100  CO2 20*  --   GLUCOSE 373* 352*  BUN 52* 49*  CREATININE 4.32* 4.40*  CALCIUM 8.8*  --    LFT Recent Labs    04/16/20 0229  PROT 4.8*  ALBUMIN 2.1*  AST 27  ALT 16  ALKPHOS 73  BILITOT 0.8   PT/INR Recent Labs    04/16/20 0229  LABPROT 18.3*  INR 1.6*    Studies/Results: DG Abdomen 1 View  Result Date: 04/16/2020 CLINICAL DATA:  Vomiting blood.  Recent dialysis EXAM: X-RAY ABDOMEN 1 VIEW COMPARISON:  None. FINDINGS: No dilated loops of large or small bowel. Gas and stool in the rectum. No pathologic calcifications. No organomegaly. No acute osseous abnormality IMPRESSION: No acute abdominal findings. Electronically Signed   By: Suzy Bouchard M.D.   On: 04/16/2020 04:33   DG Chest Port 1 View  Result Date: 04/16/2020 CLINICAL DATA:  Questionable sepsis EXAM: PORTABLE CHEST 1 VIEW COMPARISON:  12/20/2019 FINDINGS: Normal mediastinum and cardiac silhouette. Normal pulmonary vasculature. No evidence of effusion, infiltrate, or pneumothorax. No acute bony abnormality. IMPRESSION: No acute cardiopulmonary process. Electronically Signed   By: Suzy Bouchard M.D.   On: 04/16/2020 04:31    Impression: Hematemesis in a patient known to have esophageal  and gastric varices Hemoglobin 7 on presentation, repeat hemoglobin 6.8, patient received 2 units of PRBC transfusion Platelet 39, has received 1 unit platelet PT/INR 18.3/1.6 MELDNa 26  Multiple comorbidities-end-stage renal disease on hemodialysis, diabetes  Plan: EGD today, hopefully after second unit of PRBC transfusion is complete If there is bleeding from gastric varices, patient will benefit from IR consult for evaluation for TIPS Currently is hemodynamically stable, continue IV octreotide    LOS: 0 days   Ronnette Juniper, MD  04/16/2020, 7:54 AM

## 2020-04-16 NOTE — Progress Notes (Signed)
CRITICAL VALUE ALERT  Critical Value:  Hgb 6.5  Date & Time Notied:  04/16/20 1530   Provider Notified: Dr. Lamonte Sakai  Orders Received/Actions taken: 2U PRBC ordered to transfuse

## 2020-04-16 NOTE — Progress Notes (Signed)
Patient initially planned for EGD today.  However, his potassium was 6.1, and as per anesthesia will not be stable for sedation with propofol.  Fortunately, no further episodes of hematemesis reported.  Patient's blood pressure and heart rate seems stable.  Discussed the same with critical care team.  Plan to continue IV octreotide and Protonix.  Plan to get nephrology involved.  Hopefully with correction of potassium, endoscopy can be performed in a.m. tomorrow.

## 2020-04-16 NOTE — ED Triage Notes (Signed)
PT had dialysis today. Was initially called out for a fall. Pt has had bright red vomiting and bowel incontinence. Was cool, pale, diaphoretic. EMS reports GCS 8. Initial BP 50/20. I.O. was placed in Left tibia. Hx of diabetes- CBG 444

## 2020-04-16 NOTE — Progress Notes (Signed)
eLink Physician-Brief Progress Note Patient Name: William [name] DOB: 12/26/1950 MRN: [id number]   Date of Service  04/16/2020  HPI/Events of Note  Nursing request for CBC in AM.  eICU Interventions  Will order CBC with platelets at 5 AM.      Intervention Category Major Interventions: Other:  Lysle Dingwall 04/16/2020, 11:56 PM

## 2020-04-17 ENCOUNTER — Inpatient Hospital Stay (HOSPITAL_COMMUNITY): Payer: Medicare Other | Admitting: Anesthesiology

## 2020-04-17 ENCOUNTER — Encounter (HOSPITAL_COMMUNITY)
Admission: EM | Disposition: A | Discharge status: Other Acute Inpatient Hospital | Payer: Self-pay | Source: Home / Self Care | Attending: Emergency Medicine

## 2020-04-17 ENCOUNTER — Inpatient Hospital Stay (HOSPITAL_COMMUNITY): Payer: Medicare Other

## 2020-04-17 ENCOUNTER — Ambulatory Visit: Payer: Medicare Other | Admitting: Podiatry

## 2020-04-17 ENCOUNTER — Encounter (HOSPITAL_COMMUNITY): Payer: Self-pay | Admitting: Pulmonary Disease

## 2020-04-17 DIAGNOSIS — N400 Enlarged prostate without lower urinary tract symptoms: Secondary | ICD-10-CM | POA: Diagnosis not present

## 2020-04-17 DIAGNOSIS — Z8616 Personal history of COVID-19: Secondary | ICD-10-CM | POA: Diagnosis not present

## 2020-04-17 DIAGNOSIS — E1122 Type 2 diabetes mellitus with diabetic chronic kidney disease: Secondary | ICD-10-CM | POA: Diagnosis not present

## 2020-04-17 DIAGNOSIS — I5032 Chronic diastolic (congestive) heart failure: Secondary | ICD-10-CM | POA: Diagnosis not present

## 2020-04-17 DIAGNOSIS — I132 Hypertensive heart and chronic kidney disease with heart failure and with stage 5 chronic kidney disease, or end stage renal disease: Secondary | ICD-10-CM | POA: Diagnosis not present

## 2020-04-17 DIAGNOSIS — N186 End stage renal disease: Secondary | ICD-10-CM | POA: Diagnosis not present

## 2020-04-17 DIAGNOSIS — K920 Hematemesis: Principal | ICD-10-CM

## 2020-04-17 DIAGNOSIS — N271 Small kidney, bilateral: Secondary | ICD-10-CM | POA: Diagnosis not present

## 2020-04-17 DIAGNOSIS — I864 Gastric varices: Secondary | ICD-10-CM | POA: Diagnosis not present

## 2020-04-17 DIAGNOSIS — R140 Abdominal distension (gaseous): Secondary | ICD-10-CM | POA: Diagnosis not present

## 2020-04-17 DIAGNOSIS — D62 Acute posthemorrhagic anemia: Secondary | ICD-10-CM | POA: Diagnosis not present

## 2020-04-17 DIAGNOSIS — R571 Hypovolemic shock: Secondary | ICD-10-CM | POA: Diagnosis not present

## 2020-04-17 DIAGNOSIS — K219 Gastro-esophageal reflux disease without esophagitis: Secondary | ICD-10-CM | POA: Diagnosis not present

## 2020-04-17 DIAGNOSIS — E872 Acidosis: Secondary | ICD-10-CM | POA: Diagnosis not present

## 2020-04-17 DIAGNOSIS — K766 Portal hypertension: Secondary | ICD-10-CM | POA: Diagnosis not present

## 2020-04-17 DIAGNOSIS — E1129 Type 2 diabetes mellitus with other diabetic kidney complication: Secondary | ICD-10-CM | POA: Diagnosis not present

## 2020-04-17 DIAGNOSIS — I8500 Esophageal varices without bleeding: Secondary | ICD-10-CM | POA: Diagnosis not present

## 2020-04-17 DIAGNOSIS — M310 Hypersensitivity angiitis: Secondary | ICD-10-CM | POA: Diagnosis not present

## 2020-04-17 DIAGNOSIS — K922 Gastrointestinal hemorrhage, unspecified: Secondary | ICD-10-CM | POA: Diagnosis not present

## 2020-04-17 DIAGNOSIS — I509 Heart failure, unspecified: Secondary | ICD-10-CM | POA: Diagnosis not present

## 2020-04-17 DIAGNOSIS — N2581 Secondary hyperparathyroidism of renal origin: Secondary | ICD-10-CM | POA: Diagnosis not present

## 2020-04-17 DIAGNOSIS — K7460 Unspecified cirrhosis of liver: Secondary | ICD-10-CM | POA: Diagnosis not present

## 2020-04-17 DIAGNOSIS — E875 Hyperkalemia: Secondary | ICD-10-CM | POA: Diagnosis not present

## 2020-04-17 DIAGNOSIS — Z992 Dependence on renal dialysis: Secondary | ICD-10-CM | POA: Diagnosis not present

## 2020-04-17 HISTORY — PX: ESOPHAGOGASTRODUODENOSCOPY (EGD) WITH PROPOFOL: SHX5813

## 2020-04-17 LAB — GLUCOSE, CAPILLARY
Glucose-Capillary: 100 mg/dL — ABNORMAL HIGH (ref 70–99)
Glucose-Capillary: 103 mg/dL — ABNORMAL HIGH (ref 70–99)
Glucose-Capillary: 104 mg/dL — ABNORMAL HIGH (ref 70–99)
Glucose-Capillary: 104 mg/dL — ABNORMAL HIGH (ref 70–99)
Glucose-Capillary: 117 mg/dL — ABNORMAL HIGH (ref 70–99)
Glucose-Capillary: 122 mg/dL — ABNORMAL HIGH (ref 70–99)
Glucose-Capillary: 131 mg/dL — ABNORMAL HIGH (ref 70–99)
Glucose-Capillary: 93 mg/dL (ref 70–99)

## 2020-04-17 LAB — BASIC METABOLIC PANEL
Anion gap: 8 (ref 5–15)
BUN: 17 mg/dL (ref 8–23)
CO2: 27 mmol/L (ref 22–32)
Calcium: 7.6 mg/dL — ABNORMAL LOW (ref 8.9–10.3)
Chloride: 103 mmol/L (ref 98–111)
Creatinine, Ser: 1.92 mg/dL — ABNORMAL HIGH (ref 0.61–1.24)
GFR calc Af Amer: 41 mL/min — ABNORMAL LOW (ref >60)
GFR calc non Af Amer: 35 mL/min — ABNORMAL LOW (ref >60)
Glucose, Bld: 108 mg/dL — ABNORMAL HIGH (ref 70–99)
Potassium: 3.3 mmol/L — ABNORMAL LOW (ref 3.5–5.1)
Sodium: 138 mmol/L (ref 135–145)

## 2020-04-17 LAB — PREPARE PLATELET PHERESIS: Unit division: 0

## 2020-04-17 LAB — TYPE AND SCREEN
ABO/RH(D): O POS
Antibody Screen: NEGATIVE
Unit division: 0
Unit division: 0
Unit division: 0
Unit division: 0

## 2020-04-17 LAB — BPAM RBC
Blood Product Expiration Date: 202110132359
Blood Product Expiration Date: 202110142359
Blood Product Expiration Date: 202110142359
Blood Product Expiration Date: 202110142359
ISSUE DATE / TIME: 202109120559
ISSUE DATE / TIME: 202109120825
ISSUE DATE / TIME: 202109121618
ISSUE DATE / TIME: 202109122309
Unit Type and Rh: 5100
Unit Type and Rh: 5100
Unit Type and Rh: 5100
Unit Type and Rh: 5100

## 2020-04-17 LAB — PHOSPHORUS: Phosphorus: 1.4 mg/dL — ABNORMAL LOW (ref 2.5–4.6)

## 2020-04-17 LAB — MAGNESIUM: Magnesium: 1.6 mg/dL — ABNORMAL LOW (ref 1.7–2.4)

## 2020-04-17 LAB — BPAM PLATELET PHERESIS
Blood Product Expiration Date: 202109132359
ISSUE DATE / TIME: 202109120427
Unit Type and Rh: 5100

## 2020-04-17 SURGERY — ESOPHAGOGASTRODUODENOSCOPY (EGD) WITH PROPOFOL
Anesthesia: Monitor Anesthesia Care

## 2020-04-17 MED ORDER — SODIUM CHLORIDE 0.9 % IV SOLN
2.0000 g | INTRAVENOUS | Status: DC
  Administered 2020-04-17 – 2020-04-18 (×2): 2 g via INTRAVENOUS
  Filled 2020-04-17: qty 20
  Filled 2020-04-17 (×2): qty 2
  Filled 2020-04-17: qty 20

## 2020-04-17 MED ORDER — SODIUM CHLORIDE 0.9 % IV SOLN: INTRAVENOUS | Status: DC | PRN

## 2020-04-17 MED ORDER — PROPOFOL 10 MG/ML IV BOLUS
INTRAVENOUS | Status: DC | PRN
  Administered 2020-04-17 (×2): 20 mg via INTRAVENOUS

## 2020-04-17 MED ORDER — SODIUM CHLORIDE 0.9 % IV SOLN
INTRAVENOUS | Status: DC | PRN
  Administered 2020-04-17: 1000 mL via INTRAVENOUS

## 2020-04-17 MED ORDER — HYDRALAZINE HCL 20 MG/ML IJ SOLN
5.0000 mg | INTRAMUSCULAR | Status: DC | PRN
  Administered 2020-04-17 – 2020-04-18 (×5): 5 mg via INTRAVENOUS
  Filled 2020-04-17 (×4): qty 1

## 2020-04-17 MED ORDER — ORAL CARE MOUTH RINSE
15.0000 mL | Freq: Two times a day (BID) | OROMUCOSAL | Status: DC
  Administered 2020-04-18 – 2020-04-19 (×3): 15 mL via OROMUCOSAL

## 2020-04-17 MED ORDER — CHLORHEXIDINE GLUCONATE CLOTH 2 % EX PADS: 6.0000 | MEDICATED_PAD | Freq: Every day | CUTANEOUS | Status: DC

## 2020-04-17 MED ORDER — IOHEXOL 350 MG/ML SOLN
100.0000 mL | Freq: Once | INTRAVENOUS | Status: AC | PRN
  Administered 2020-04-17: 100 mL via INTRAVENOUS

## 2020-04-17 MED ORDER — SODIUM PHOSPHATES 45 MMOLE/15ML IV SOLN
20.0000 mmol | Freq: Once | INTRAVENOUS | Status: AC
  Administered 2020-04-17: 20 mmol via INTRAVENOUS
  Filled 2020-04-17: qty 6.67

## 2020-04-17 MED ORDER — ONDANSETRON HCL 4 MG/2ML IJ SOLN
INTRAMUSCULAR | Status: DC | PRN
  Administered 2020-04-17: 4 mg via INTRAVENOUS

## 2020-04-17 MED ORDER — PROPOFOL 500 MG/50ML IV EMUL
INTRAVENOUS | Status: DC | PRN
  Administered 2020-04-17: 100 ug/kg/min via INTRAVENOUS

## 2020-04-17 SURGICAL SUPPLY — 15 items

## 2020-04-17 NOTE — Progress Notes (Signed)
[facility] Progress Note   Subjective:   Patient seen in room, s/p endoscopy this AM. Son at bedside and interpreting for patient. Pt denies SOB, CP, palpitations, abdominal pain, N/V/D. L hand mildly edematous, likely from IV. Receiving IVF with high BP, will stop fluids.   Objective Vitals:   04/17/20 0858 04/17/20 0900 04/17/20 0956 04/17/20 1007  BP: (!) 187/61 (!) 187/61  (!) 188/69  Pulse: 83 84    Resp: 19 20    Temp:   98.6 F (37 C)   TempSrc:   Oral   SpO2: 100% 100%    Weight:      Height:       Physical Exam General: Well developed, alert and in NAD Heart: RRR, no murmurs, rubs or gallops Lungs: CTA bilaterally without wheezing, rhonchi or rales Abdomen: Soft, non-tender, non-distended, +BS Extremities: Nonpitting edema LLE, no edema b/l lower extremities Dialysis Access: LUE AVF +bruit  Additional Objective Labs: Basic Metabolic Panel: Recent Labs  Lab 04/16/20 0229 04/16/20 0229 04/16/20 0259 04/16/20 1138 04/17/20 0451  NA 135  --  134*  133*  --  138  K 6.1*   < > 6.1*  6.0* 5.6* 3.3*  CL 101  --  100  --  103  CO2 20*  --   --   --  27  GLUCOSE 373*  --  352*  --  108*  BUN 52*  --  49*  --  17  CREATININE 4.32*  --  4.40*  --  1.92*  CALCIUM 8.8*  --   --   --  7.6*  PHOS  --   --   --   --  1.4*   < > = values in this interval not displayed.   Liver Function Tests: Recent Labs  Lab 04/16/20 0229  AST 27  ALT 16  ALKPHOS 73  BILITOT 0.8  PROT 4.8*  ALBUMIN 2.1*   Recent Labs  Lab 04/16/20 0229  LIPASE 24   CBC: Recent Labs  Lab 04/16/20 0229 04/16/20 0259 04/16/20 1132 04/16/20 1454 04/17/20 0451  WBC 5.0   < > 5.0 4.2 4.8  NEUTROABS 2.9  --   --   --  3.1  HGB 7.0*   < > 7.6* 6.5* 10.1*  HCT 22.8*   < > 23.9* 20.5* 30.0*  MCV 86.7  --  83.9 83.3 82.6  PLT 39*   < > 38* 30* 33*   < > = values in this interval not displayed.   Blood Culture    Component Value Date/Time   SDES BLOOD RIGHT HAND  04/16/2020 0246   SPECREQUEST  04/16/2020 0246    BOTTLES DRAWN AEROBIC AND ANAEROBIC Blood Culture results may not be optimal due to an inadequate volume of blood received in culture bottles   CULT  04/16/2020 0246    NO GROWTH < 12 HOURS Performed at [facility], [place] [address], [place], [place] [address]    REPTSTATUS PENDING 04/16/2020 0246   CBG: Recent Labs  Lab 04/16/20 2038 04/17/20 0006 04/17/20 0349 04/17/20 0756 04/17/20 0953  GLUCAP 118* 100* 104* 117* 131*    Studies/Results: DG Abdomen 1 View  Result Date: 04/16/2020 CLINICAL DATA:  Vomiting blood.  Recent dialysis EXAM: X-RAY ABDOMEN 1 VIEW COMPARISON:  None. FINDINGS: No dilated loops of large or small bowel. Gas and stool in the rectum. No pathologic calcifications. No organomegaly. No acute osseous abnormality IMPRESSION: No acute abdominal findings. Electronically  Signed   By: Suzy Bouchard M.D.   On: 04/16/2020 04:33   DG Chest Port 1 View  Result Date: 04/16/2020 CLINICAL DATA:  Questionable sepsis EXAM: PORTABLE CHEST 1 VIEW COMPARISON:  12/20/2019 FINDINGS: Normal mediastinum and cardiac silhouette. Normal pulmonary vasculature. No evidence of effusion, infiltrate, or pneumothorax. No acute bony abnormality. IMPRESSION: No acute cardiopulmonary process. Electronically Signed   By: Suzy Bouchard M.D.   On: 04/16/2020 04:31   Medications: . sodium chloride    . cefTRIAXone (ROCEPHIN)  IV    . octreotide  (SANDOSTATIN)    IV infusion 50 mcg/hr (04/17/20 0929)  . phenylephrine (NEO-SYNEPHRINE) Adult infusion Stopped (04/16/20 0910)   . Chlorhexidine Gluconate Cloth  6 each Topical Q0600  . insulin aspart  1-3 Units Subcutaneous Q4H  . pantoprazole (PROTONIX) IV  40 mg Intravenous Q12H    Dialysis Orders: Center: [facility] on TTS. 4 hrs 0 min, 180NRe Optiflux, BFR 400, DFR Manual 800 mL/min, EDW 68 (kg), Dialysate 2.0 K, 3.5 Ca No heparin Mircera 75 mcg IV q 2 weeks- last dose  9/7 (hgb 9.8 on 9/9) Venofer 101m weekly  Assessment/Plan: 1.  Acute GI bleed: Presented to the ED after a fall with active bloody emesis and bowel movements. Hgb 6.8 with  plt 39. Received  4 units PRBC and 1 unit platelets, Hgb 10.1 this AM. S/p endoscopy this morning. Per GI. Up to date on ESA. 2. Hyperkalemia: K+ 6.1 on admission, underwent HD overnight to correct hyperkalemia. K+ now slightly low due to low K+ dialysis overnight, will allow to come up and use higher K+ bath tomorrow.  3.  ESRD:  On dialysis TTS, had extra HD night of 9/12 as above. BUN 17, K+3.3. Reduce HD time to 3 hours tomorrow.  4.  Hypertension/volume: Hypotensive on arrival, improved with IVF and PRBC and now hypertensive. Stopping IVF.  5.  Metabolic bone disease: Corrected calcium 9.1. Not on VDRA. On calcium acetate once daily as outpatient, phos is 1.4 today, replete and continue to hold binder. Discussed with pharmacy.  6.  Nutrition:  Currently NPO  SAnice Paganini PA-C 04/17/2020, 10:35 AM  [facility] Pager: [phone]

## 2020-04-17 NOTE — Progress Notes (Signed)
[name], MRN:  [id number], DOB:  [birth year], LOS: 1 ADMISSION DATE:  04/16/2020, CONSULTATION DATE:  04/16/2020 REFERRING MD:  EDP, CHIEF COMPLAINT:  Shock  Brief History   69 year old man with history of HFpEF, cirrhosis c/b esophageal varices, ESRD on HD, Goodpasture's disease, hypertension, type 2 diabetes presented after fall found to have hypotension and GI bleed.  History of present illness   69 year old man with history of HFpEF, cirrhosis c/b esophageal varices, ESRD on HD, Goodpasture's disease, hypertension, type 2 diabetes presented after fall.  He is accompanied by his son who helped with providing history.  He has been "spitting up blood" and also had bright red blood per rectum that started today.  Presently he denies chest pain, shortness of breath, abdominal pain.  Per EMS was initially found to be hypotensive.  He was given IV fluids with some improvement in his blood pressure.  Past Medical History  HFpEF, cirrhosis c/b esophageal varices, ESRD on HD, Goodpasture's disease, hypertension, type 2 diabetes  Significant Hospital Events   9/12> admit  Consults:  9/12 GI  Procedures:  None  Significant Diagnostic Tests:  XR abd 9/12>No acute abdominal findings.  XR chest 9/12>No acute cardiopulmonary process.  EGD 9/13 >> grade 1 varices in the middle lower thirds of the esophagus without stigmata of bleeding.  1 large gastric varix at the gastric fundus it was ulcerated or erosive with 1 larger 1 smaller erosive area on it.  Evidence for portal hypertensive gastropathy in the body of the stomach.  Old clot in the stomach.  Duodenum was normal  Micro Data:  COVID 9/12> neg BCx 9/12> neg  Antimicrobials:  ceftriaxone 9/12>  Interim history/subjective:  HD performed overnight EGD performed this morning as above  Objective   Blood pressure (!) 144/48, pulse 86, temperature 98.5 F (36.9 C), temperature source Oral, resp. rate 18, height 5' 5"  (1.651  m), weight 66 kg, SpO2 97 %.        Intake/Output Summary (Last 24 hours) at 04/17/2020 1400 Last data filed at 04/17/2020 1100 Gross per 24 hour  Intake 3470.65 ml  Output 200 ml  Net 3270.65 ml   Filed Weights   04/16/20 0515  Weight: 66 kg    Examination: General: Thin man, no distress HENT: Oropharynx pale, moist Lungs: Clear, distant, no wheeze Cardiovascular: Regular, no murmur Abdomen: Nondistended, nontender, positive bowel sounds Extremities: No lower extremity edema Neuro: Awake, alert, able to communicate with translation assistance from his son.  No focal deficits  Assessment & Plan:  69 year old man with history of HFpEF, cirrhosis c/b esophageal varices, ESRD on HD, Goodpasture's disease, hypertension, type 2 diabetes presented after fall found to have hypotension and GI bleed.  Acute upper GI bleed, with history of cirrhosis.  Evidence for gastric variceal bleeding on EGD 9/13: -Appreciate gastroenterology assistance.  Dr. Penelope Coop recommends either transfer to [facility] where he gets his chronic care versus urgent TIPS locally.  I spoke with the [facility] transfer center and there are no open beds.  I believe the most straightforward and safest approach would be to discuss with interventional radiology here in [place]. -Continue octreotide infusion -PPI twice daily -Follow serial CBC -Home antihypertensive regimen is on hold (carvedilol, amlodipine)  Lactic acidosis: Improved -Maintain adequate perfusion  ESRD:  -hemodialysis performed with improvement in acidosis, creatinine, hyperkalemia -Appreciate nephrology assistance  DM: -Sliding scale insulin per protocol -Follow CBG  Best practice:  Diet: NPO Pain/Anxiety/Delirium protocol (if indicated): Monitor VAP protocol (if indicated):  N/A DVT prophylaxis: SCDs GI prophylaxis: See above Glucose control: See above Mobility: OOB when able Code Status: Full Family Communication: Discussed with patient and his son  at bedside on 9/13 Disposition: ICU  Labs   CBC: Recent Labs  Lab 04/16/20 0229 04/16/20 0259 04/16/20 1132 04/16/20 1454 04/17/20 0451  WBC 5.0  --  5.0 4.2 4.8  NEUTROABS 2.9  --   --   --  3.1  HGB 7.0* 6.8*  6.8* 7.6* 6.5* 10.1*  HCT 22.8* 20.0*  20.0* 23.9* 20.5* 30.0*  MCV 86.7  --  83.9 83.3 82.6  PLT 39*  --  38* 30* 33*    Basic Metabolic Panel: Recent Labs  Lab 04/16/20 0229 04/16/20 0259 04/16/20 1138 04/17/20 0451  NA 135 134*  133*  --  138  K 6.1* 6.1*  6.0* 5.6* 3.3*  CL 101 100  --  103  CO2 20*  --   --  27  GLUCOSE 373* 352*  --  108*  BUN 52* 49*  --  17  CREATININE 4.32* 4.40*  --  1.92*  CALCIUM 8.8*  --   --  7.6*  MG  --   --   --  1.6*  PHOS  --   --   --  1.4*   GFR: Estimated Creatinine Clearance: 32 mL/min (A) (by C-G formula based on SCr of 1.92 mg/dL (H)). Recent Labs  Lab 04/16/20 0229 04/16/20 0428 04/16/20 1132 04/16/20 1454 04/17/20 0451  WBC 5.0  --  5.0 4.2 4.8  LATICACIDVEN 5.8* 2.9*  --   --   --     Liver Function Tests: Recent Labs  Lab 04/16/20 0229  AST 27  ALT 16  ALKPHOS 73  BILITOT 0.8  PROT 4.8*  ALBUMIN 2.1*   Recent Labs  Lab 04/16/20 0229  LIPASE 24   No results for input(s): AMMONIA in the last 168 hours.  ABG    Component Value Date/Time   PHART 7.453 (H) 12/20/2017 1149   PCO2ART 29.6 (L) 12/20/2017 1149   PO2ART 63.0 (L) 12/20/2017 1149   HCO3 23.5 04/16/2020 0259   TCO2 24 04/16/2020 0259   TCO2 22 04/16/2020 0259   ACIDBASEDEF 3.0 (H) 12/20/2017 1149   O2SAT 100.0 04/16/2020 0259     Coagulation Profile: Recent Labs  Lab 04/16/20 0229  INR 1.6*    Cardiac Enzymes: No results for input(s): CKTOTAL, CKMB, CKMBINDEX, TROPONINI in the last 168 hours.  HbA1C: Hemoglobin A1C  Date/Time Value Ref Range Status  03/17/2019 10:24 AM 6.8 (A) 4.0 - 5.6 % Final  10/06/2018 08:28 AM 7.6 (A) 4.0 - 5.6 % Final   Hgb A1c MFr Bld  Date/Time Value Ref Range Status  07/09/2018  09:15 AM 7.7 (H) 4.6 - 6.5 % Final    Comment:    Glycemic Control Guidelines for People with Diabetes:Non Diabetic:  <6%Goal of Therapy: <7%Additional Action Suggested:  >8%   03/09/2018 12:29 PM 7.0 (H) 4.8 - 5.6 % Final    Comment:             Prediabetes: 5.7 - 6.4          Diabetes: >6.4          Glycemic control for adults with diabetes: <7.0     CBG: Recent Labs  Lab 04/17/20 0006 04/17/20 0349 04/17/20 0756 04/17/20 0953 04/17/20 1215  GLUCAP 100* 104* 117* 131* 93     Critical care time: The patient is critically  ill with multiple organ systems failure and requires high complexity decision making for assessment and support, frequent evaluation and titration of therapies, application of advanced monitoring technologies and extensive interpretation of multiple databases.   Critical Care Time devoted to patient care services described in this note is  35 Minutes. This time reflects time of care of this signee. This critical care time does not reflect procedure time, or teaching time or supervisory time of PA/NP/Med student/Med Resident etc but could involve care discussion time.  Baltazar Apo, MD, PhD 04/17/2020, 2:09 PM [facility] [phone] or if no answer [phone]

## 2020-04-17 NOTE — Progress Notes (Signed)
   04/17/20 0300  Hand-Off documentation  Handoff Given Given to shift RN/LPN  Report given to (Full Name) Vita Barley, RN  Handoff Received Received from shift RN/LPN  Report received from (Full Name) Bernetta Sutley, RN  Vital Signs  Temp 98.4 F (36.9 C) (pulling prn med)  Temp Source Oral  Pulse Rate 87  Pulse Rate Source Monitor  Resp 20  BP (!) 202/50  BP Location Left Leg  BP Method Automatic  Patient Position (if appropriate) Lying  Oxygen Therapy  SpO2 98 %  O2 Device Nasal Cannula  Post-Hemodialysis Assessment  Rinseback Volume (mL) 250 mL  KECN 279 V  Dialyzer Clearance Lightly streaked  Duration of HD Treatment -hour(s) 4 hour(s)  Hemodialysis Intake (mL) 800 mL  UF Total -Machine (mL) 1000 mL  Net UF (mL) 200 mL  Tolerated HD Treatment Yes  Post-Hemodialysis Comments tx achieved as expected, well tolerated, no complaints.  AVG/AVF Arterial Site Held (minutes) 10 minutes  AVG/AVF Venous Site Held (minutes) 15 minutes  Education / Care Plan  Dialysis Education Provided No (Comment)  Note  Observations alert and stable

## 2020-04-17 NOTE — Anesthesia Procedure Notes (Signed)
Procedure Name: MAC Date/Time: 04/17/2020 8:14 AM Performed by: Inda Coke, CRNA Pre-anesthesia Checklist: Patient identified, Emergency Drugs available, Suction available, Timeout performed and Patient being monitored Patient Re-evaluated:Patient Re-evaluated prior to induction Oxygen Delivery Method: Nasal cannula Induction Type: IV induction Dental Injury: Teeth and Oropharynx as per pre-operative assessment

## 2020-04-17 NOTE — Progress Notes (Signed)
Spoke with Cyril Mourning, nurse transplant coordinator at [facility], her number is [phone] for any questions regarding his transplant status. Hiram Gash RN

## 2020-04-17 NOTE — Progress Notes (Signed)
PHARMACY NOTE:  ANTIMICROBIAL DOSAGE ADJUSTMENT  Current antimicrobial regimen includes a mismatch between antimicrobial dosage and indication.  As per policy approved by the Pharmacy & Therapeutics and Medical Executive Committees, the antimicrobial dosage will be adjusted accordingly.  Current antimicrobial dosage:  Ceftriaxone 1g IV q24h  Indication: variceal bleed in cirrhotic patient  Renal Function:  Estimated Creatinine Clearance: 32 mL/min (A) (by C-G formula based on SCr of 1.92 mg/dL (H)).   Antimicrobial dosage has been changed to:  Ceftriaxone 2g IV q24h   Arturo Morton, PharmD, BCPS Please check AMION for all [place] contact numbers Clinical Pharmacist 04/17/2020 10:41 AM

## 2020-04-17 NOTE — Transfer of Care (Signed)
Immediate Anesthesia Transfer of Care Note  Patient: William [name]  Procedure(s) Performed: ESOPHAGOGASTRODUODENOSCOPY (EGD) WITH PROPOFOL (N/A )  Patient Location: Endoscopy Unit  Anesthesia Type:MAC  Level of Consciousness: awake and drowsy  Airway & Oxygen Therapy: Patient Spontanous Breathing and Patient connected to nasal cannula oxygen  Post-op Assessment: Report given to RN and Post -op Vital signs reviewed and stable  Post vital signs: Reviewed and stable  Last Vitals:  Vitals Value Taken Time  BP    Temp    Pulse    Resp    SpO2      Last Pain:  Vitals:   04/17/20 0726  TempSrc: Oral  PainSc: 0-No pain         Complications: No complications documented.

## 2020-04-17 NOTE — H&P (Signed)
Patient seen today in the endoscopy unit and interviewed and examined with the use of the Stratus interpreter machine.  The patient presents to the endoscopy unit today for EGD to evaluate for recent complaints of hematemesis.  He has a history of cirrhosis of the liver and has on the list for transplant evaluation at [facility].  In reviewing the records he had an EGD at [facility] on September 8 showing small grade 1 esophageal varices and localized gastric varix in the fundus.  He has been transfused blood and he feels better today.  Physical:  No distress  Heart regular rhythm  Lungs clear  Abdomen soft and nontender with some ascites   Impression: Cirrhosis of the liver with recent hematemesis  Plan: Proceed with EGD

## 2020-04-17 NOTE — Op Note (Addendum)
[facility] Patient Name: William [name] Procedure Date : 04/17/2020 MRN: [id number] Attending MD: Wonda Horner , MD Date of Birth: [birth year] CSN: [id number] Age: 69 Admit Type: Inpatient Procedure:                Upper GI endoscopy Indications:              Hematemesis Providers:                Wonda Horner, MD, Burtis Junes, RN, Tyna Jaksch                            Technician, Rejeana Brock, CRNA Referring MD:              Medicines:                Propofol per Anesthesia Complications:            No immediate complications. Estimated Blood Loss:      Procedure:                Pre-Anesthesia Assessment:                           - Prior to the procedure, a History and Physical                            was performed, and patient medications and                            allergies were reviewed. The patient's tolerance of                            previous anesthesia was also reviewed. The risks                            and benefits of the procedure and the sedation                            options and risks were discussed with the patient.                            All questions were answered, and informed consent                            was obtained. Prior Anticoagulants: The patient has                            taken no previous anticoagulant or antiplatelet                            agents. ASA Grade Assessment: III - A patient with                            severe systemic disease. After reviewing the risks  and benefits, the patient was deemed in                            satisfactory condition to undergo the procedure.                           After obtaining informed consent, the endoscope was                            passed under direct vision. Throughout the                            procedure, the patient's blood pressure, pulse, and                            oxygen saturations were monitored continuously. The                             GIF-H190 ([id number]) Olympus gastroscope was                            introduced through the mouth, and advanced to the                            second part of duodenum. The upper GI endoscopy was                            accomplished without difficulty. The patient                            tolerated the procedure well. Scope In: Scope Out: Findings:      Grade I varices were found in the middle third of the esophagus and in       the lower third of the esophagus. No stigmata of bleeding.      One large gastric varix was found in the gastric fundus. This varix was       ulcerated or erosive with one large erosive and one smaller erosive area       on it. There was portal hypertensive gastropathy in the body of the       stomach. There was old blood clot in the stomach.      The examined duodenum was normal. Impression:               - Grade I esophageal varices.                           - Gastric varices.                           - Normal examined duodenum.                           - No specimens collected. Recommendation:           - NPO.                           -  Continue present medications.PPI and Octreotide.                           Discuss with his liver doctor at [facility] to consider                            possible transfer vs IR consult here for                            consideration of TIPS. Procedure Code(s):        --- Professional ---                           [phone], Esophagogastroduodenoscopy, flexible,                            transoral; diagnostic, including collection of                            specimen(s) by brushing or washing, when performed                            (separate procedure) Diagnosis Code(s):        --- Professional ---                           I85.00, Esophageal varices without bleeding                           I86.4, Gastric varices                           K92.0, Hematemesis CPT copyright 2019  American Medical Association. All rights reserved. The codes documented in this report are preliminary and upon coder review may  be revised to meet current compliance requirements. Wonda Horner, MD 04/17/2020 8:57:58 AM This report has been signed electronically. Number of Addenda: 0

## 2020-04-17 NOTE — Progress Notes (Signed)
    Hx Cirrhosis- espoh varices and gastric varices ESRD- on HD-- Goodpastures disease HTN; DM Admitted after fall; hypotension GI bleed  Spitting up blood and BRBPR since yesterday  EGD 9/13 >> grade 1 varices in the middle lower thirds of the esophagus without stigmata of bleeding.  1 large gastric varix at the gastric fundus it was ulcerated or erosive with 1 larger 1 smaller erosive area on it.  Evidence for portal hypertensive gastropathy in the body of the stomach.  Old clot in the stomach.  Duodenum was normal  Dr Lamonte Sakai spoke to Dr Earleen Newport Will get CTA Abd/pelvis with BRTO protocol today  Determine of candidate for BRTO

## 2020-04-18 DIAGNOSIS — I864 Gastric varices: Secondary | ICD-10-CM | POA: Diagnosis not present

## 2020-04-18 DIAGNOSIS — I132 Hypertensive heart and chronic kidney disease with heart failure and with stage 5 chronic kidney disease, or end stage renal disease: Secondary | ICD-10-CM | POA: Diagnosis not present

## 2020-04-18 DIAGNOSIS — K922 Gastrointestinal hemorrhage, unspecified: Secondary | ICD-10-CM | POA: Diagnosis not present

## 2020-04-18 DIAGNOSIS — K920 Hematemesis: Secondary | ICD-10-CM | POA: Diagnosis not present

## 2020-04-18 DIAGNOSIS — Z992 Dependence on renal dialysis: Secondary | ICD-10-CM | POA: Diagnosis not present

## 2020-04-18 DIAGNOSIS — E875 Hyperkalemia: Secondary | ICD-10-CM | POA: Diagnosis not present

## 2020-04-18 DIAGNOSIS — I8500 Esophageal varices without bleeding: Secondary | ICD-10-CM | POA: Diagnosis not present

## 2020-04-18 DIAGNOSIS — I509 Heart failure, unspecified: Secondary | ICD-10-CM | POA: Diagnosis not present

## 2020-04-18 DIAGNOSIS — N186 End stage renal disease: Secondary | ICD-10-CM | POA: Diagnosis not present

## 2020-04-18 DIAGNOSIS — E1129 Type 2 diabetes mellitus with other diabetic kidney complication: Secondary | ICD-10-CM | POA: Diagnosis not present

## 2020-04-18 LAB — CBC WITH DIFFERENTIAL/PLATELET
Abs Immature Granulocytes: 0.02 10*3/uL (ref 0.00–0.07)
Abs Immature Granulocytes: 0.01 10*3/uL (ref 0.00–0.07)
Basophils Absolute: 0 10*3/uL (ref 0.0–0.1)
Basophils Absolute: 0 10*3/uL (ref 0.0–0.1)
Basophils Relative: 0 %
Basophils Relative: 0 %
Eosinophils Absolute: 0.2 10*3/uL (ref 0.0–0.5)
Eosinophils Absolute: 0.2 10*3/uL (ref 0.0–0.5)
Eosinophils Relative: 4 %
Eosinophils Relative: 5 %
HCT: 22.8 % — ABNORMAL LOW (ref 39.0–52.0)
HCT: 30 % — ABNORMAL LOW (ref 39.0–52.0)
Hemoglobin: 7 g/dL — ABNORMAL LOW (ref 13.0–17.0)
Hemoglobin: 10.1 g/dL — ABNORMAL LOW (ref 13.0–17.0)
Immature Granulocytes: 0 %
Immature Granulocytes: 0 %
Lymphocytes Relative: 25 %
Lymphocytes Relative: 15 %
Lymphs Abs: 1.3 10*3/uL (ref 0.7–4.0)
Lymphs Abs: 0.7 10*3/uL (ref 0.7–4.0)
MCH: 26.6 pg (ref 26.0–34.0)
MCH: 27.8 pg (ref 26.0–34.0)
MCHC: 30.7 g/dL (ref 30.0–36.0)
MCHC: 33.7 g/dL (ref 30.0–36.0)
MCV: 86.7 fL (ref 80.0–100.0)
MCV: 82.6 fL (ref 80.0–100.0)
Monocytes Absolute: 0.6 10*3/uL (ref 0.1–1.0)
Monocytes Absolute: 0.7 10*3/uL (ref 0.1–1.0)
Monocytes Relative: 12 %
Monocytes Relative: 15 %
Neutro Abs: 2.9 10*3/uL (ref 1.7–7.7)
Neutro Abs: 3.1 10*3/uL (ref 1.7–7.7)
Neutrophils Relative %: 59 %
Neutrophils Relative %: 65 %
Platelets: 39 10*3/uL — ABNORMAL LOW (ref 150–400)
Platelets: 33 10*3/uL — ABNORMAL LOW (ref 150–400)
RBC: 2.63 MIL/uL — ABNORMAL LOW (ref 4.22–5.81)
RBC: 3.63 MIL/uL — ABNORMAL LOW (ref 4.22–5.81)
RDW: 15.2 % (ref 11.5–15.5)
RDW: 15.3 % (ref 11.5–15.5)
WBC: 5 10*3/uL (ref 4.0–10.5)
WBC: 4.8 10*3/uL (ref 4.0–10.5)
nRBC: 0.6 % — ABNORMAL HIGH (ref 0.0–0.2)
nRBC: 0 % (ref 0.0–0.2)

## 2020-04-18 LAB — URINALYSIS, ROUTINE W REFLEX MICROSCOPIC
Bacteria, UA: NONE SEEN
Bilirubin Urine: NEGATIVE
Glucose, UA: NEGATIVE mg/dL
Hgb urine dipstick: NEGATIVE
Ketones, ur: NEGATIVE mg/dL
Leukocytes,Ua: NEGATIVE
Nitrite: NEGATIVE
Protein, ur: 30 mg/dL — AB
Specific Gravity, Urine: 1.027 (ref 1.005–1.030)
pH: 5 (ref 5.0–8.0)

## 2020-04-18 LAB — RENAL FUNCTION PANEL
Albumin: 2.4 g/dL — ABNORMAL LOW (ref 3.5–5.0)
Anion gap: 15 (ref 5–15)
BUN: 39 mg/dL — ABNORMAL HIGH (ref 8–23)
CO2: 20 mmol/L — ABNORMAL LOW (ref 22–32)
Calcium: 6.9 mg/dL — ABNORMAL LOW (ref 8.9–10.3)
Chloride: 100 mmol/L (ref 98–111)
Creatinine, Ser: 4.4 mg/dL — ABNORMAL HIGH (ref 0.61–1.24)
GFR calc Af Amer: 15 mL/min — ABNORMAL LOW (ref >60)
GFR calc non Af Amer: 13 mL/min — ABNORMAL LOW (ref >60)
Glucose, Bld: 122 mg/dL — ABNORMAL HIGH (ref 70–99)
Phosphorus: 4.3 mg/dL (ref 2.5–4.6)
Potassium: 3.8 mmol/L (ref 3.5–5.1)
Sodium: 135 mmol/L (ref 135–145)

## 2020-04-18 LAB — CBC
HCT: 28.4 % — ABNORMAL LOW (ref 39.0–52.0)
Hemoglobin: 9 g/dL — ABNORMAL LOW (ref 13.0–17.0)
MCH: 27.1 pg (ref 26.0–34.0)
MCHC: 31.7 g/dL (ref 30.0–36.0)
MCV: 85.5 fL (ref 80.0–100.0)
Platelets: 33 10*3/uL — ABNORMAL LOW (ref 150–400)
RBC: 3.32 MIL/uL — ABNORMAL LOW (ref 4.22–5.81)
RDW: 16.6 % — ABNORMAL HIGH (ref 11.5–15.5)
WBC: 4.4 10*3/uL (ref 4.0–10.5)
nRBC: 0 % (ref 0.0–0.2)

## 2020-04-18 LAB — GLUCOSE, CAPILLARY
Glucose-Capillary: 118 mg/dL — ABNORMAL HIGH (ref 70–99)
Glucose-Capillary: 84 mg/dL (ref 70–99)
Glucose-Capillary: 85 mg/dL (ref 70–99)
Glucose-Capillary: 328 mg/dL — ABNORMAL HIGH (ref 70–99)
Glucose-Capillary: 256 mg/dL — ABNORMAL HIGH (ref 70–99)

## 2020-04-18 LAB — MRSA PCR SCREENING: MRSA by PCR: NEGATIVE

## 2020-04-18 MED ORDER — INSULIN ASPART 100 UNIT/ML ~~LOC~~ SOLN
0.0000 [IU] | Freq: Every day | SUBCUTANEOUS | Status: DC
  Administered 2020-04-18: 3 [IU] via SUBCUTANEOUS

## 2020-04-18 MED ORDER — INSULIN ASPART 100 UNIT/ML ~~LOC~~ SOLN
0.0000 [IU] | Freq: Three times a day (TID) | SUBCUTANEOUS | Status: DC
  Administered 2020-04-18 – 2020-04-19 (×2): 11 [IU] via SUBCUTANEOUS
  Administered 2020-04-19: 8 [IU] via SUBCUTANEOUS

## 2020-04-18 NOTE — Progress Notes (Signed)
[facility] Progress Note   Subjective:   Patient seen in room. Tolerating well. BP remains elevated, UF goal increased to 3L today. No CP or SOB reported.   Objective Vitals:   04/18/20 0930 04/18/20 0945 04/18/20 1000 04/18/20 1014  BP: (!) 175/66 (!) 137/56 (!) 152/59 (!) 165/65  Pulse: 89 82 83 82  Resp: (!) 21 16 16 16   Temp:    99 F (37.2 C)  TempSrc:    Oral  SpO2: 100% 100% 99% 99%  Weight:    69.9 kg  Height:       Physical Exam General: Well developed, alert and in NAD Heart: RRR, no murmurs, rubs or gallops Lungs: CTA bilaterally without wheezing, rhonchi or rales Abdomen: Soft, non-tender, non-distended, +BS Extremities: Nonpitting edema LLE, no edema b/l lower extremities Dialysis Access: LUE AVF +bruit   Additional Objective Labs: Basic Metabolic Panel: Recent Labs  Lab 04/16/20 0229 04/16/20 0229 04/16/20 0259 04/16/20 0259 04/16/20 1138 04/17/20 0451 04/18/20 0617  NA 135   < > 134*  133*  --   --  138 135  K 6.1*   < > 6.1*  6.0*   < > 5.6* 3.3* 3.8  CL 101   < > 100  --   --  103 100  CO2 20*  --   --   --   --  27 20*  GLUCOSE 373*   < > 352*  --   --  108* 122*  BUN 52*   < > 49*  --   --  17 39*  CREATININE 4.32*   < > 4.40*  --   --  1.92* 4.40*  CALCIUM 8.8*  --   --   --   --  7.6* 6.9*  PHOS  --   --   --   --   --  1.4* 4.3   < > = values in this interval not displayed.   Liver Function Tests: Recent Labs  Lab 04/16/20 0229 04/18/20 0617  AST 27  --   ALT 16  --   ALKPHOS 73  --   BILITOT 0.8  --   PROT 4.8*  --   ALBUMIN 2.1* 2.4*   Recent Labs  Lab 04/16/20 0229  LIPASE 24   CBC: Recent Labs  Lab 04/16/20 0229 04/16/20 0259 04/16/20 1132 04/16/20 1132 04/16/20 1454 04/17/20 0451 04/18/20 0617  WBC 5.0   < > 5.0   < > 4.2 4.8 4.4  NEUTROABS 2.9  --   --   --   --  3.1  --   HGB 7.0*   < > 7.6*   < > 6.5* 10.1* 9.0*  HCT 22.8*   < > 23.9*   < > 20.5* 30.0* 28.4*  MCV 86.7  --  83.9  --   83.3 82.6 85.5  PLT 39*   < > 38*   < > 30* 33* 33*   < > = values in this interval not displayed.   Blood Culture    Component Value Date/Time   SDES BLOOD RIGHT HAND 04/16/2020 0246   SPECREQUEST  04/16/2020 0246    BOTTLES DRAWN AEROBIC AND ANAEROBIC Blood Culture results may not be optimal due to an inadequate volume of blood received in culture bottles   CULT  04/16/2020 0246    NO GROWTH 2 DAYS Performed at [facility], [place] [address], [place], [place] [address]    REPTSTATUS PENDING 04/16/2020  0246    CBG: Recent Labs  Lab 04/17/20 1559 04/17/20 2005 04/17/20 2343 04/18/20 0349 04/18/20 0834  GLUCAP 122* 103* 104* 118* 84    Studies/Results: CT Angio Abd/Pel w/ and/or w/o  Result Date: 04/17/2020 CLINICAL DATA:  Abdominal pain and distension. EXAM: CTA ABDOMEN AND PELVIS WITHOUT AND WITH CONTRAST TECHNIQUE: Multidetector CT imaging of the abdomen and pelvis was performed using the standard protocol during bolus administration of intravenous contrast. Multiplanar reconstructed images and MIPs were obtained and reviewed to evaluate the vascular anatomy. CONTRAST:  180m OMNIPAQUE IOHEXOL 350 MG/ML SOLN COMPARISON:  May 24, 2019 FINDINGS: VASCULAR Aorta: Normal caliber aorta without aneurysm, dissection, vasculitis or significant stenosis. Celiac: Patent without evidence of aneurysm, dissection, vasculitis or significant stenosis. SMA: Patent without evidence of aneurysm, dissection, vasculitis or significant stenosis. Renals: Both renal arteries are patent without evidence of aneurysm, dissection, vasculitis, fibromuscular dysplasia or significant stenosis. IMA: Patent without evidence of aneurysm, dissection, vasculitis or significant stenosis. Inflow: Patent without evidence of aneurysm, dissection, vasculitis or significant stenosis. Proximal Outflow: Bilateral common femoral and visualized portions of the superficial and profunda femoral arteries are patent without  evidence of aneurysm, dissection, vasculitis or significant stenosis. Veins: No obvious venous abnormality within the limitations of this arterial phase study. Review of the MIP images confirms the above findings. NON-VASCULAR Lower chest: A stable 6 mm noncalcified lung nodule is seen within the anterolateral aspect of the right lower lobe. Small bilateral pleural effusions are noted. Hepatobiliary: The liver is cirrhotic in appearance. No focal liver abnormality is seen. No gallstones, gallbladder wall thickening, or biliary dilatation. Pancreas: Unremarkable. No pancreatic ductal dilatation or surrounding inflammatory changes. Spleen: The spleen is moderately enlarged with multiple dilated tortuous vessels seen along the splenic hilum. Adrenals/Urinary Tract: Adrenal glands are unremarkable. Kidneys are small in size, without renal calculi, focal lesion, or hydronephrosis. Bladder is unremarkable. Stomach/Bowel: Stomach is within normal limits. Appendix appears normal. No evidence of bowel dilatation. Mild thickening of the mid and distal sigmoid colon is seen. Lymphatic: No abnormal abdominal or pelvic lymph nodes are identified. Reproductive: The prostate gland is markedly enlarged. Other: No abdominal wall hernia or abnormality. A very small amount of abdominal free fluid is seen. Musculoskeletal: No acute or significant osseous findings. IMPRESSION: 1. Cirrhosis with findings consistent with portal hypertension. 2. Mild thickening of the mid and distal sigmoid colon, which may represent mild colitis. 3. Small bilateral pleural effusions. 4. Stable 6 mm noncalcified lung nodule within the anterolateral aspect of the right lower lobe. Correlation with 12 month follow-up chest CT is recommended to determine stability. 5. Markedly enlarged prostate gland. 6. Very small amount of abdominal free fluid. 7. No evidence of aneurysmal dilatation, dissection, vasculitis or significant stenosis within the arterial  structures of the abdomen and pelvis. Electronically Signed   By: TVirgina NorfolkM.D.   On: 04/17/2020 20:38   Medications: . sodium chloride    . sodium chloride Stopped (04/17/20 2257)  . cefTRIAXone (ROCEPHIN)  IV Stopped (04/17/20 2151)  . octreotide  (SANDOSTATIN)    IV infusion 50 mcg/hr (04/18/20 0945)   . Chlorhexidine Gluconate Cloth  6 each Topical Q0600  . insulin aspart  1-3 Units Subcutaneous Q4H  . mouth rinse  15 mL Mouth Rinse BID  . pantoprazole (PROTONIX) IV  40 mg Intravenous Q12H    [facility] hrs 0 min, 180NRe Optiflux, BFR 400, DFR Manual 800 mL/min, EDW 68 (kg), Dialysate 2.0 K, 3.5 Ca  No heparin Mircera 75 mcg IV q 2 weeks- last dose 9/7 (hgb 9.8 on 9/9) Venofer 43m weekly  Assessment/Plan: 1. Acute GI bleed: Presented to the ED after a fall with active bloody emesis and bowel movements. Hgb 6.8 with plt 39. Received  4 units PRBC and 1 unit platelets, Hgb 9.0 this AM. S/p endoscopy 9/13, IR consulted for consideration of TIPS. Work up per primary team  2. Hyperkalemia:K+ 6.1 on admission, underwent HD overnight to correct hyperkalemia. K+ now at goal.  3. ESRD:On dialysis TTS, had extra HD night of 9/12 as above. Receiving HD today, next dialysis 04/20/20.  4. Hypertension/volume:Hypotensive on arrival, improved with IVF and PRBC and now hypertensive. IVF stopped and UF goal increased to 3L today. If patient requires antihypertensive meds, would ocnsider beta blocker given history of esophageal varices.  5. Metabolic bone disease:Corrected calcium 8.2. Not on VDRA. On calcium acetate once daily as outpatient, phos is 1.4 9/13, supplemented and now at goal. Continue to hold binder.  6. Nutrition:Currently NPO  SAnice Paganini PA-C 04/18/2020, 10:42 AM  [facility] Pager: [phone]

## 2020-04-18 NOTE — Progress Notes (Signed)
William [name], MRN:  [id number], DOB:  [birth year], LOS: 2 ADMISSION DATE:  04/16/2020, CONSULTATION DATE:  04/16/2020 REFERRING MD:  EDP, CHIEF COMPLAINT:  Shock  Brief History   69 year old man with history of HFpEF, cirrhosis c/b esophageal varices, ESRD on HD, Goodpasture's disease, hypertension, type 2 diabetes presented after fall found to have hypotension and GI bleed.  History of present illness   69 year old man with history of HFpEF, cirrhosis c/b esophageal varices, ESRD on HD, Goodpasture's disease, hypertension, type 2 diabetes presented after fall.  He is accompanied by his son who helped with providing history.  He has been "spitting up blood" and also had bright red blood per rectum that started today.  Presently he denies chest pain, shortness of breath, abdominal pain.  Per EMS was initially found to be hypotensive.  He was given IV fluids with some improvement in his blood pressure.  Past Medical History  HFpEF, cirrhosis c/b esophageal varices, ESRD on HD, Goodpasture's disease, hypertension, type 2 diabetes  Significant Hospital Events   9/12> admit  Consults:  9/12 GI  Procedures:  None  Significant Diagnostic Tests:  XR abd 9/12>No acute abdominal findings.  XR chest 9/12>No acute cardiopulmonary process.  EGD 9/13 >> grade 1 varices in the middle lower thirds of the esophagus without stigmata of bleeding.  1 large gastric varix at the gastric fundus it was ulcerated or erosive with 1 larger 1 smaller erosive area on it.  Evidence for portal hypertensive gastropathy in the body of the stomach.  Old clot in the stomach.  Duodenum was normal  Micro Data:  COVID 9/12> neg BCx 9/12> neg  Antimicrobials:  ceftriaxone 9/12>  Interim history/subjective:  Await transfer to [facility]  Objective   Blood pressure (!) 146/54, pulse 82, temperature 98.1 F (36.7 C), temperature source Oral, resp. rate 14, height 5' 5"  (1.651 m), weight  69.9 kg, SpO2 100 %.        Intake/Output Summary (Last 24 hours) at 04/18/2020 1316 Last data filed at 04/18/2020 1014 Gross per 24 hour  Intake 863.59 ml  Output 3050 ml  Net -2186.41 ml   Filed Weights   04/18/20 0500 04/18/20 0700 04/18/20 1014  Weight: 73.2 kg 73.3 kg 69.9 kg    Examination: General: No acute distress at rest HEENT: No JVD Neuro: Grossly intact CV: Heart sounds are regular PULM: Diminished in the bases GI: soft, bsx4 active and painful to help Extremities: warm/dry,  edema  Skin: no rashes or lesions   Assessment & Plan:  69 year old man with history of HFpEF, cirrhosis c/b esophageal varices, ESRD on HD, Goodpasture's disease, hypertension, type 2 diabetes presented after fall found to have hypotension and GI bleed.  Acute upper GI bleed, with history of cirrhosis.  Evidence for gastric variceal bleeding on EGD 9/13: -Appreciate gastroenterology assistance.  Dr. Penelope Coop recommends either transfer to [facility] where he gets his chronic care versus urgent TIPS locally.  I spoke with the [facility] transfer center and there are no open beds.  I believe the most straightforward and safest approach would be to discuss with interventional radiology here in [place].  Continue to octreotide infusion Proton pump inhibitor Monitor CBC Hold antihypertensive regimen Transfer to [facility] bed available Transfer to progressive care current hospital transfer   Lactic acidosis: Improved Resolved  ESRD:  Hemodialysis per nephrology  DM: CBG (last 3)  Recent Labs    04/18/20 0349 04/18/20 0834 04/18/20 1207  GLUCAP 118*  84 85    -Sliding scale insulin per protocol -Follow CBG  Best practice:  Diet: NPO Pain/Anxiety/Delirium protocol (if indicated): Monitor VAP protocol (if indicated): N/A DVT prophylaxis: SCDs GI prophylaxis: See above Glucose control: See above Mobility: OOB when able Code Status: Full Family Communication: Discussed  with patient and his son at bedside on 9/14 Disposition: ICU  Labs   CBC: Recent Labs  Lab 04/16/20 0229 04/16/20 0229 04/16/20 0259 04/16/20 1132 04/16/20 1454 04/17/20 0451 04/18/20 0617  WBC 5.0  --   --  5.0 4.2 4.8 4.4  NEUTROABS 2.9  --   --   --   --  3.1  --   HGB 7.0*   < > 6.8*  6.8* 7.6* 6.5* 10.1* 9.0*  HCT 22.8*   < > 20.0*  20.0* 23.9* 20.5* 30.0* 28.4*  MCV 86.7  --   --  83.9 83.3 82.6 85.5  PLT 39*  --   --  38* 30* 33* 33*   < > = values in this interval not displayed.    Basic Metabolic Panel: Recent Labs  Lab 04/16/20 0229 04/16/20 0259 04/16/20 1138 04/17/20 0451 04/18/20 0617  NA 135 134*  133*  --  138 135  K 6.1* 6.1*  6.0* 5.6* 3.3* 3.8  CL 101 100  --  103 100  CO2 20*  --   --  27 20*  GLUCOSE 373* 352*  --  108* 122*  BUN 52* 49*  --  17 39*  CREATININE 4.32* 4.40*  --  1.92* 4.40*  CALCIUM 8.8*  --   --  7.6* 6.9*  MG  --   --   --  1.6*  --   PHOS  --   --   --  1.4* 4.3   GFR: Estimated Creatinine Clearance: 14 mL/min (A) (by C-G formula based on SCr of 4.4 mg/dL (H)). Recent Labs  Lab 04/16/20 0229 04/16/20 0229 04/16/20 0428 04/16/20 1132 04/16/20 1454 04/17/20 0451 04/18/20 0617  WBC 5.0   < >  --  5.0 4.2 4.8 4.4  LATICACIDVEN 5.8*  --  2.9*  --   --   --   --    < > = values in this interval not displayed.    Liver Function Tests: Recent Labs  Lab 04/16/20 0229 04/18/20 0617  AST 27  --   ALT 16  --   ALKPHOS 73  --   BILITOT 0.8  --   PROT 4.8*  --   ALBUMIN 2.1* 2.4*   Recent Labs  Lab 04/16/20 0229  LIPASE 24   No results for input(s): AMMONIA in the last 168 hours.  ABG    Component Value Date/Time   PHART 7.453 (H) 12/20/2017 1149   PCO2ART 29.6 (L) 12/20/2017 1149   PO2ART 63.0 (L) 12/20/2017 1149   HCO3 23.5 04/16/2020 0259   TCO2 24 04/16/2020 0259   TCO2 22 04/16/2020 0259   ACIDBASEDEF 3.0 (H) 12/20/2017 1149   O2SAT 100.0 04/16/2020 0259     Coagulation Profile: Recent Labs    Lab 04/16/20 0229  INR 1.6*    Cardiac Enzymes: No results for input(s): CKTOTAL, CKMB, CKMBINDEX, TROPONINI in the last 168 hours.  HbA1C: Hemoglobin A1C  Date/Time Value Ref Range Status  03/17/2019 10:24 AM 6.8 (A) 4.0 - 5.6 % Final  10/06/2018 08:28 AM 7.6 (A) 4.0 - 5.6 % Final   Hgb A1c MFr Bld  Date/Time Value Ref Range Status  07/09/2018  09:15 AM 7.7 (H) 4.6 - 6.5 % Final    Comment:    Glycemic Control Guidelines for People with Diabetes:Non Diabetic:  <6%Goal of Therapy: <7%Additional Action Suggested:  >8%   03/09/2018 12:29 PM 7.0 (H) 4.8 - 5.6 % Final    Comment:             Prediabetes: 5.7 - 6.4          Diabetes: >6.4          Glycemic control for adults with diabetes: <7.0     CBG: Recent Labs  Lab 04/17/20 2005 04/17/20 2343 04/18/20 0349 04/18/20 0834 04/18/20 1207  GLUCAP 103* 104* 118* 84 85     App cct 30 min  William [name] ACNP Acute Care Nurse Practitioner [place] Please consult Amion 04/18/2020, 1:17 PM

## 2020-04-18 NOTE — Progress Notes (Signed)
The patient is doing well today and no signs of GI bleeding.  Interventional radiology's consult noted.  They do not have plans for B RTO unless status changes.  If he bleeds again he will need interventional radiology's assistance with either B RTO or TIPS procedure because of gastric varices.  We should also consider prophylactic BRTO to prevent gastric variceal bleeding.  In the meanwhile would be okay with advancing diet and continuing octreotide and PPI therapy.  Would recommend seeing if [facility] hepatology could get him on a waiting list for transfer.

## 2020-04-18 NOTE — Progress Notes (Signed)
VIR available for intervention should patient condition change.

## 2020-04-19 ENCOUNTER — Encounter (HOSPITAL_COMMUNITY): Payer: Self-pay | Admitting: Gastroenterology

## 2020-04-19 ENCOUNTER — Inpatient Hospital Stay (HOSPITAL_COMMUNITY): Payer: Medicare Other

## 2020-04-19 DIAGNOSIS — K922 Gastrointestinal hemorrhage, unspecified: Secondary | ICD-10-CM | POA: Diagnosis not present

## 2020-04-19 DIAGNOSIS — I864 Gastric varices: Secondary | ICD-10-CM | POA: Diagnosis not present

## 2020-04-19 DIAGNOSIS — E1165 Type 2 diabetes mellitus with hyperglycemia: Secondary | ICD-10-CM | POA: Diagnosis not present

## 2020-04-19 DIAGNOSIS — M310 Hypersensitivity angiitis: Secondary | ICD-10-CM | POA: Diagnosis not present

## 2020-04-19 DIAGNOSIS — D631 Anemia in chronic kidney disease: Secondary | ICD-10-CM | POA: Diagnosis not present

## 2020-04-19 DIAGNOSIS — R571 Hypovolemic shock: Secondary | ICD-10-CM | POA: Diagnosis not present

## 2020-04-19 DIAGNOSIS — Z888 Allergy status to other drugs, medicaments and biological substances status: Secondary | ICD-10-CM | POA: Diagnosis not present

## 2020-04-19 DIAGNOSIS — E875 Hyperkalemia: Secondary | ICD-10-CM | POA: Diagnosis not present

## 2020-04-19 DIAGNOSIS — N2581 Secondary hyperparathyroidism of renal origin: Secondary | ICD-10-CM | POA: Diagnosis not present

## 2020-04-19 DIAGNOSIS — I509 Heart failure, unspecified: Secondary | ICD-10-CM | POA: Diagnosis not present

## 2020-04-19 DIAGNOSIS — Z1624 Resistance to multiple antibiotics: Secondary | ICD-10-CM | POA: Diagnosis not present

## 2020-04-19 DIAGNOSIS — E8889 Other specified metabolic disorders: Secondary | ICD-10-CM | POA: Diagnosis not present

## 2020-04-19 DIAGNOSIS — K7460 Unspecified cirrhosis of liver: Secondary | ICD-10-CM | POA: Diagnosis not present

## 2020-04-19 DIAGNOSIS — D638 Anemia in other chronic diseases classified elsewhere: Secondary | ICD-10-CM | POA: Diagnosis not present

## 2020-04-19 DIAGNOSIS — I5032 Chronic diastolic (congestive) heart failure: Secondary | ICD-10-CM | POA: Diagnosis not present

## 2020-04-19 DIAGNOSIS — Z1612 Extended spectrum beta lactamase (ESBL) resistance: Secondary | ICD-10-CM | POA: Diagnosis not present

## 2020-04-19 DIAGNOSIS — J90 Pleural effusion, not elsewhere classified: Secondary | ICD-10-CM | POA: Diagnosis not present

## 2020-04-19 DIAGNOSIS — D6959 Other secondary thrombocytopenia: Secondary | ICD-10-CM | POA: Diagnosis not present

## 2020-04-19 DIAGNOSIS — K760 Fatty (change of) liver, not elsewhere classified: Secondary | ICD-10-CM | POA: Diagnosis not present

## 2020-04-19 DIAGNOSIS — I81 Portal vein thrombosis: Secondary | ICD-10-CM | POA: Diagnosis not present

## 2020-04-19 DIAGNOSIS — K920 Hematemesis: Secondary | ICD-10-CM | POA: Diagnosis not present

## 2020-04-19 DIAGNOSIS — I132 Hypertensive heart and chronic kidney disease with heart failure and with stage 5 chronic kidney disease, or end stage renal disease: Secondary | ICD-10-CM | POA: Diagnosis not present

## 2020-04-19 DIAGNOSIS — N390 Urinary tract infection, site not specified: Secondary | ICD-10-CM | POA: Diagnosis not present

## 2020-04-19 DIAGNOSIS — Z992 Dependence on renal dialysis: Secondary | ICD-10-CM | POA: Diagnosis not present

## 2020-04-19 DIAGNOSIS — R188 Other ascites: Secondary | ICD-10-CM | POA: Diagnosis not present

## 2020-04-19 DIAGNOSIS — K7290 Hepatic failure, unspecified without coma: Secondary | ICD-10-CM | POA: Diagnosis not present

## 2020-04-19 DIAGNOSIS — D696 Thrombocytopenia, unspecified: Secondary | ICD-10-CM | POA: Diagnosis not present

## 2020-04-19 DIAGNOSIS — E1122 Type 2 diabetes mellitus with diabetic chronic kidney disease: Secondary | ICD-10-CM | POA: Diagnosis not present

## 2020-04-19 DIAGNOSIS — I8510 Secondary esophageal varices without bleeding: Secondary | ICD-10-CM | POA: Diagnosis not present

## 2020-04-19 DIAGNOSIS — K3189 Other diseases of stomach and duodenum: Secondary | ICD-10-CM | POA: Diagnosis not present

## 2020-04-19 DIAGNOSIS — D62 Acute posthemorrhagic anemia: Secondary | ICD-10-CM | POA: Diagnosis not present

## 2020-04-19 DIAGNOSIS — Z7682 Awaiting organ transplant status: Secondary | ICD-10-CM | POA: Diagnosis not present

## 2020-04-19 DIAGNOSIS — I272 Pulmonary hypertension, unspecified: Secondary | ICD-10-CM | POA: Diagnosis not present

## 2020-04-19 DIAGNOSIS — I8500 Esophageal varices without bleeding: Secondary | ICD-10-CM | POA: Diagnosis not present

## 2020-04-19 DIAGNOSIS — N186 End stage renal disease: Secondary | ICD-10-CM | POA: Diagnosis not present

## 2020-04-19 DIAGNOSIS — Z20822 Contact with and (suspected) exposure to covid-19: Secondary | ICD-10-CM | POA: Diagnosis not present

## 2020-04-19 DIAGNOSIS — E1129 Type 2 diabetes mellitus with other diabetic kidney complication: Secondary | ICD-10-CM | POA: Diagnosis not present

## 2020-04-19 DIAGNOSIS — K766 Portal hypertension: Secondary | ICD-10-CM | POA: Diagnosis not present

## 2020-04-19 DIAGNOSIS — E872 Acidosis: Secondary | ICD-10-CM | POA: Diagnosis not present

## 2020-04-19 DIAGNOSIS — Z8616 Personal history of COVID-19: Secondary | ICD-10-CM | POA: Diagnosis not present

## 2020-04-19 LAB — BASIC METABOLIC PANEL
Anion gap: 10 (ref 5–15)
BUN: 26 mg/dL — ABNORMAL HIGH (ref 8–23)
CO2: 26 mmol/L (ref 22–32)
Calcium: 7 mg/dL — ABNORMAL LOW (ref 8.9–10.3)
Chloride: 102 mmol/L (ref 98–111)
Creatinine, Ser: 3.84 mg/dL — ABNORMAL HIGH (ref 0.61–1.24)
GFR calc Af Amer: 18 mL/min — ABNORMAL LOW (ref >60)
GFR calc non Af Amer: 15 mL/min — ABNORMAL LOW (ref >60)
Glucose, Bld: 147 mg/dL — ABNORMAL HIGH (ref 70–99)
Potassium: 4.1 mmol/L (ref 3.5–5.1)
Sodium: 138 mmol/L (ref 135–145)

## 2020-04-19 LAB — CBC WITH DIFFERENTIAL/PLATELET
Abs Immature Granulocytes: 0.01 10*3/uL (ref 0.00–0.07)
Basophils Absolute: 0 10*3/uL (ref 0.0–0.1)
Basophils Relative: 1 %
Eosinophils Absolute: 0.2 10*3/uL (ref 0.0–0.5)
Eosinophils Relative: 6 %
HCT: 28.9 % — ABNORMAL LOW (ref 39.0–52.0)
Hemoglobin: 9.2 g/dL — ABNORMAL LOW (ref 13.0–17.0)
Immature Granulocytes: 0 %
Lymphocytes Relative: 18 %
Lymphs Abs: 0.6 10*3/uL — ABNORMAL LOW (ref 0.7–4.0)
MCH: 27.1 pg (ref 26.0–34.0)
MCHC: 31.8 g/dL (ref 30.0–36.0)
MCV: 85 fL (ref 80.0–100.0)
Monocytes Absolute: 0.5 10*3/uL (ref 0.1–1.0)
Monocytes Relative: 16 %
Neutro Abs: 1.9 10*3/uL (ref 1.7–7.7)
Neutrophils Relative %: 59 %
Platelets: 29 10*3/uL — CL (ref 150–400)
RBC: 3.4 MIL/uL — ABNORMAL LOW (ref 4.22–5.81)
RDW: 16.6 % — ABNORMAL HIGH (ref 11.5–15.5)
Smear Review: DECREASED
WBC: 3.3 10*3/uL — ABNORMAL LOW (ref 4.0–10.5)
nRBC: 0 % (ref 0.0–0.2)

## 2020-04-19 LAB — URINE CULTURE: Culture: 70000 — AB

## 2020-04-19 LAB — MAGNESIUM: Magnesium: 1.8 mg/dL (ref 1.7–2.4)

## 2020-04-19 LAB — GLUCOSE, CAPILLARY
Glucose-Capillary: 111 mg/dL — ABNORMAL HIGH (ref 70–99)
Glucose-Capillary: 276 mg/dL — ABNORMAL HIGH (ref 70–99)
Glucose-Capillary: 316 mg/dL — ABNORMAL HIGH (ref 70–99)

## 2020-04-19 LAB — PHOSPHORUS: Phosphorus: 2.5 mg/dL (ref 2.5–4.6)

## 2020-04-19 LAB — SARS CORONAVIRUS 2 BY RT PCR (HOSPITAL ORDER, PERFORMED IN ~~LOC~~ HOSPITAL LAB): SARS Coronavirus 2: NEGATIVE

## 2020-04-19 MED ORDER — SODIUM CHLORIDE 0.9 % IV SOLN: 50.0000 ug/h | INTRAVENOUS | 0 refills | Status: AC

## 2020-04-19 MED ORDER — SODIUM CHLORIDE 0.9 % IV SOLN: 2.0000 g | INTRAVENOUS | 0 refills | Status: AC

## 2020-04-19 MED ORDER — CARVEDILOL 12.5 MG PO TABS
12.5000 mg | ORAL_TABLET | Freq: Two times a day (BID) | ORAL | Status: DC
  Administered 2020-04-19: 12.5 mg via ORAL
  Filled 2020-04-19: qty 1

## 2020-04-19 MED ORDER — AMLODIPINE BESYLATE 10 MG PO TABS
10.0000 mg | ORAL_TABLET | Freq: Every day | ORAL | Status: DC
  Administered 2020-04-19: 10 mg via ORAL
  Filled 2020-04-19: qty 1

## 2020-04-19 MED ORDER — PANTOPRAZOLE SODIUM 40 MG IV SOLR: 40.0000 mg | Freq: Two times a day (BID) | INTRAVENOUS | 0 refills | Status: DC

## 2020-04-19 MED ORDER — CHLORHEXIDINE GLUCONATE CLOTH 2 % EX PADS: 6.0000 | MEDICATED_PAD | Freq: Every day | CUTANEOUS | Status: DC

## 2020-04-19 NOTE — Plan of Care (Addendum)
D/w IR this morning  -pt remains on PCCM service 9/15, has been transferred out of ICU 9/14 -pt evaluated and deemed candidate for BRTO -Plan 9/14 was for transfer to [facility] for hepatology service, but remains at [facility] at this time -In discussion with IR (who has discussed with GI), best course of action in this instance is likely to proceed with BRTO this admission  -I am awaiting callback from [facility] transfer center / hepatology for clarification & to discuss. In interim, we will lay groundwork for possible BRTO here  PCCM progress note to follow  Eliseo Gum MSN, AGACNP-BC [place] [phone] If no answer, [phone] 04/19/2020, 11:15 AM

## 2020-04-19 NOTE — Progress Notes (Signed)
[facility] Progress Note   Subjective:   Pt seen in room. Reports abdomen is sore and feels full today. No SOB or CP.   Objective Vitals:   04/18/20 1932 04/18/20 2301 04/19/20 0301 04/19/20 0807  BP: (!) 160/57 (!) 163/53 (!) 166/52 (!) 164/46  Pulse: 85 80 79 83  Resp: (!) 24 18 17 16   Temp: 99.2 F (37.3 C) (!) 97.5 F (36.4 C) 100 F (37.8 C) 99.7 F (37.6 C)  TempSrc: Oral  Oral Oral  SpO2: 100% 100% 99% 96%  Weight:      Height:       Physical Exam General: Well developed, alert and in NAD Heart: RRR, no murmurs, rubs or gallops Lungs: CTA bilaterally without wheezing, rhonchi or rales Abdomen: Soft, non-distended, +BS Extremities: No edema b/l lower extremities Dialysis Access: LUE AVF + bruit  Additional Objective Labs: Basic Metabolic Panel: Recent Labs  Lab 04/17/20 0451 04/18/20 0617 04/19/20 0223  NA 138 135 138  K 3.3* 3.8 4.1  CL 103 100 102  CO2 27 20* 26  GLUCOSE 108* 122* 147*  BUN 17 39* 26*  CREATININE 1.92* 4.40* 3.84*  CALCIUM 7.6* 6.9* 7.0*  PHOS 1.4* 4.3 2.5   Liver Function Tests: Recent Labs  Lab 04/16/20 0229 04/18/20 0617  AST 27  --   ALT 16  --   ALKPHOS 73  --   BILITOT 0.8  --   PROT 4.8*  --   ALBUMIN 2.1* 2.4*   Recent Labs  Lab 04/16/20 0229  LIPASE 24   CBC: Recent Labs  Lab 04/16/20 0229 04/16/20 0259 04/16/20 1132 04/16/20 1132 04/16/20 1454 04/16/20 1454 04/17/20 0451 04/18/20 0617 04/19/20 0223  WBC 5.0   < > 5.0   < > 4.2   < > 4.8 4.4 3.3*  NEUTROABS 2.9  --   --   --   --   --  3.1  --  1.9  HGB 7.0*   < > 7.6*   < > 6.5*   < > 10.1* 9.0* 9.2*  HCT 22.8*   < > 23.9*   < > 20.5*   < > 30.0* 28.4* 28.9*  MCV 86.7   < > 83.9  --  83.3  --  82.6 85.5 85.0  PLT 39*   < > 38*   < > 30*   < > 33* 33* 29*   < > = values in this interval not displayed.   Blood Culture    Component Value Date/Time   SDES URINE, CLEAN CATCH 04/18/2020 0118   SPECREQUEST  04/18/2020 0118     NONE Performed at [facility] [address], [place], [place] [address]    CULT (A) 04/18/2020 0118    70,000 COLONIES/mL ESCHERICHIA COLI Confirmed Extended Spectrum Beta-Lactamase Producer (ESBL).  In bloodstream infections from ESBL organisms, carbapenems are preferred over piperacillin/tazobactam. They are shown to have a lower risk of mortality.    REPTSTATUS 04/19/2020 FINAL 04/18/2020 0118    CBG: Recent Labs  Lab 04/18/20 0834 04/18/20 1207 04/18/20 1746 04/18/20 2004 04/19/20 0615  GLUCAP 84 85 328* 256* 111*    Studies/Results: DG Chest Port 1 View  Result Date: 04/19/2020 CLINICAL DATA:  Abnormal respirations EXAM: PORTABLE CHEST 1 VIEW COMPARISON:  04/16/2020 FINDINGS: New bilateral airspace disease, right greater than left. Heart is borderline in size. Small right pleural effusion. No pneumothorax. IMPRESSION: New bilateral airspace disease, right greater than left. This could reflect  asymmetric edema or infection. Small right effusion. Electronically Signed   By: Rolm Baptise M.D.   On: 04/19/2020 08:27   CT Angio Abd/Pel w/ and/or w/o  Result Date: 04/17/2020 CLINICAL DATA:  Abdominal pain and distension. EXAM: CTA ABDOMEN AND PELVIS WITHOUT AND WITH CONTRAST TECHNIQUE: Multidetector CT imaging of the abdomen and pelvis was performed using the standard protocol during bolus administration of intravenous contrast. Multiplanar reconstructed images and MIPs were obtained and reviewed to evaluate the vascular anatomy. CONTRAST:  14m OMNIPAQUE IOHEXOL 350 MG/ML SOLN COMPARISON:  May 24, 2019 FINDINGS: VASCULAR Aorta: Normal caliber aorta without aneurysm, dissection, vasculitis or significant stenosis. Celiac: Patent without evidence of aneurysm, dissection, vasculitis or significant stenosis. SMA: Patent without evidence of aneurysm, dissection, vasculitis or significant stenosis. Renals: Both renal arteries are patent without evidence of aneurysm, dissection,  vasculitis, fibromuscular dysplasia or significant stenosis. IMA: Patent without evidence of aneurysm, dissection, vasculitis or significant stenosis. Inflow: Patent without evidence of aneurysm, dissection, vasculitis or significant stenosis. Proximal Outflow: Bilateral common femoral and visualized portions of the superficial and profunda femoral arteries are patent without evidence of aneurysm, dissection, vasculitis or significant stenosis. Veins: No obvious venous abnormality within the limitations of this arterial phase study. Review of the MIP images confirms the above findings. NON-VASCULAR Lower chest: A stable 6 mm noncalcified lung nodule is seen within the anterolateral aspect of the right lower lobe. Small bilateral pleural effusions are noted. Hepatobiliary: The liver is cirrhotic in appearance. No focal liver abnormality is seen. No gallstones, gallbladder wall thickening, or biliary dilatation. Pancreas: Unremarkable. No pancreatic ductal dilatation or surrounding inflammatory changes. Spleen: The spleen is moderately enlarged with multiple dilated tortuous vessels seen along the splenic hilum. Adrenals/Urinary Tract: Adrenal glands are unremarkable. Kidneys are small in size, without renal calculi, focal lesion, or hydronephrosis. Bladder is unremarkable. Stomach/Bowel: Stomach is within normal limits. Appendix appears normal. No evidence of bowel dilatation. Mild thickening of the mid and distal sigmoid colon is seen. Lymphatic: No abnormal abdominal or pelvic lymph nodes are identified. Reproductive: The prostate gland is markedly enlarged. Other: No abdominal wall hernia or abnormality. A very small amount of abdominal free fluid is seen. Musculoskeletal: No acute or significant osseous findings. IMPRESSION: 1. Cirrhosis with findings consistent with portal hypertension. 2. Mild thickening of the mid and distal sigmoid colon, which may represent mild colitis. 3. Small bilateral pleural effusions.  4. Stable 6 mm noncalcified lung nodule within the anterolateral aspect of the right lower lobe. Correlation with 12 month follow-up chest CT is recommended to determine stability. 5. Markedly enlarged prostate gland. 6. Very small amount of abdominal free fluid. 7. No evidence of aneurysmal dilatation, dissection, vasculitis or significant stenosis within the arterial structures of the abdomen and pelvis. Electronically Signed   By: TVirgina NorfolkM.D.   On: 04/17/2020 20:38   Medications:  sodium chloride     sodium chloride Stopped (04/17/20 2257)   cefTRIAXone (ROCEPHIN)  IV Stopped (04/19/20 01324   octreotide  (SANDOSTATIN)    IV infusion 50 mcg/hr (04/19/20 04010    Chlorhexidine Gluconate Cloth  6 each Topical Q0600   insulin aspart  0-15 Units Subcutaneous TID WC   insulin aspart  0-5 Units Subcutaneous QHS   mouth rinse  15 mL Mouth Rinse BID   pantoprazole (PROTONIX) IV  40 mg Intravenous Q12H    [facility] hrs 0 min, 180NRe Optiflux, BFR 400, DFR Manual 800 mL/min, EDW 68 (kg), Dialysate 2.0  K, 3.5 Ca No heparin Mircera 75 mcg IV q 2 weeks- last dose 9/7 (hgb 9.8 on 9/9) Venofer 76m weekly   Assessment/Plan: 1. Acute GI bleed: Presented to the ED after a fall with active bloody emesis and bowel movements. Hgb 6.8 with plt 39.Received 4 units PRBC and 1 unit platelets. Hgb stabele at 9.2 this AM. S/p endoscopy 9/13, IR consulted for consideration of TIPS, no procedure planned for now unless he rebleeds. [facility]  2. Hyperkalemia:K+ 6.1on admission, underwent HD overnight to correct hyperkalemia.K+ now at goal.   3. ESRD:On dialysis TTS,had extra HD night of 9/12 as above. Next dialysis 04/20/20.  4. Hypertension/volume:Hypotensive on arrival, improvedwith IVF and PRBC and now hypertensive, but improved after net UF 3L with HD yesterday. Continue to titrate down volume  with dialysis tomorrow.  If patient requires antihypertensive meds, would consider beta blocker given history of esophageal varices.  5. Metabolic bone disease:Corrected calcium8.3. Not on VDRA. Use high Ca bath with HD. On calcium acetate once daily as outpatient,phos is 1.4 9/13, supplemented and now 2.5. Continue to hold binder and will supplement phos as needed. 6. Nutrition:Advancing diet per GI  SAnice Paganini PA-C 04/19/2020, 9:32 AM  [facility] Pager: [phone]

## 2020-04-19 NOTE — Progress Notes (Signed)
NAMERamon [name], MRN:  [id number], DOB:  1950-12-04, LOS: 3 ADMISSION DATE:  04/16/2020, CONSULTATION DATE:  04/16/2020 REFERRING MD:  EDP, CHIEF COMPLAINT:  Shock  Brief History   69 year old man with history of HFpEF, cirrhosis c/b esophageal varices, ESRD on HD, Goodpasture's disease, hypertension, type 2 diabetes presented after fall found to have hypotension and GI bleed.  History of present illness   69 year old man with history of HFpEF, cirrhosis c/b esophageal varices, ESRD on HD, Goodpasture's disease, hypertension, type 2 diabetes presented after fall.  He is accompanied by his son who helped with providing history.  He has been "spitting up blood" and also had bright red blood per rectum that started today.  Presently he denies chest pain, shortness of breath, abdominal pain.  Per EMS was initially found to be hypotensive.  He was given IV fluids with some improvement in his blood pressure.  Past Medical History  HFpEF, cirrhosis c/b esophageal varices, ESRD on HD, Goodpasture's disease, hypertension, type 2 diabetes  Significant Hospital Events   9/12> admit  Consults:  9/12 GI  Procedures:  None  Significant Diagnostic Tests:  XR abd 9/12>No acute abdominal findings.  XR chest 9/12>No acute cardiopulmonary process.  EGD 9/13 >> grade 1 varices in the middle lower thirds of the esophagus without stigmata of bleeding.  1 large gastric varix at the gastric fundus it was ulcerated or erosive with 1 larger 1 smaller erosive area on it.  Evidence for portal hypertensive gastropathy in the body of the stomach.  Old clot in the stomach.  Duodenum was normal  Micro Data:  COVID 9/12> neg BCx 9/12> neg  Antimicrobials:  ceftriaxone 9/12>  Interim history/subjective:  Still not tx to [facility] Tx to triad service  Objective   Blood pressure (!) 164/46, pulse 83, temperature 99.7 F (37.6 C), temperature source Oral, resp. rate 16, height 5' 5"  (1.651 m), weight 69.9 kg,  SpO2 96 %.        Intake/Output Summary (Last 24 hours) at 04/19/2020 1133 Last data filed at 04/18/2020 1500 Gross per 24 hour  Intake 414.6 ml  Output --  Net 414.6 ml   Filed Weights   04/18/20 0500 04/18/20 0700 04/18/20 1014  Weight: 73.2 kg 73.3 kg 69.9 kg    Examination: General:  nad @ rest HEENT: MM pink/moist, no jvd Neuro: Grossly intactCV:  PULM:  Room air cta GI: soft, bsx4 active , slight fluid waveGU: Extremities: warm/dry,  edema  Skin: no rashes or lesions   Assessment & Plan:  69 year old man with history of HFpEF, cirrhosis c/b esophageal varices, ESRD on HD, Goodpasture's disease, hypertension, type 2 diabetes presented after fall found to have hypotension and GI bleed.  Acute upper GI bleed, with history of cirrhosis.  Evidence for gastric variceal bleeding on EGD 9/13: -Appreciate gastroenterology assistance.  Dr. Penelope Coop recommends either transfer to [facility] where he gets his chronic care versus urgent TIPS locally.  I spoke with the [facility] transfer center and there are no open beds.  I believe the most straightforward and safest approach would be to discuss with interventional radiology here in [place].  Reach out to [facility] again If no tx then proceed with IR procedure Continue octreotide drip Resume antihypertensive Tx to Triad service 9/16   Continue to octreotide infusion Proton pump inhibitor Monitor CBC Hold antihypertensive regimen Transfer to [facility] bed available Transfer to progressive care current hospital transfer     ESRD:  HD per renal  DM: CBG (last 3)  Recent Labs    04/18/20 1746 04/18/20 2004 04/19/20 0615  GLUCAP 328* 256* 111*    SSI protocol  Best practice:  Diet: NPO Pain/Anxiety/Delirium protocol (if indicated): Monitor VAP protocol (if indicated): N/A DVT prophylaxis: SCDs GI prophylaxis: See above Glucose control: See above Mobility: OOB when able Code Status: Full Family  Communication: Discussed with patient and his son at bedside on 9/15 Disposition: SDU  Labs   CBC: Recent Labs  Lab 04/16/20 0229 04/16/20 0259 04/16/20 1132 04/16/20 1454 04/17/20 0451 04/18/20 0617 04/19/20 0223  WBC 5.0   < > 5.0 4.2 4.8 4.4 3.3*  NEUTROABS 2.9  --   --   --  3.1  --  1.9  HGB 7.0*   < > 7.6* 6.5* 10.1* 9.0* 9.2*  HCT 22.8*   < > 23.9* 20.5* 30.0* 28.4* 28.9*  MCV 86.7   < > 83.9 83.3 82.6 85.5 85.0  PLT 39*   < > 38* 30* 33* 33* 29*   < > = values in this interval not displayed.    Basic Metabolic Panel: Recent Labs  Lab 04/16/20 0229 04/16/20 0229 04/16/20 0259 04/16/20 1138 04/17/20 0451 04/18/20 0617 04/19/20 0223  NA 135  --  134*  133*  --  138 135 138  K 6.1*   < > 6.1*  6.0* 5.6* 3.3* 3.8 4.1  CL 101  --  100  --  103 100 102  CO2 20*  --   --   --  27 20* 26  GLUCOSE 373*  --  352*  --  108* 122* 147*  BUN 52*  --  49*  --  17 39* 26*  CREATININE 4.32*  --  4.40*  --  1.92* 4.40* 3.84*  CALCIUM 8.8*  --   --   --  7.6* 6.9* 7.0*  MG  --   --   --   --  1.6*  --  1.8  PHOS  --   --   --   --  1.4* 4.3 2.5   < > = values in this interval not displayed.   GFR: Estimated Creatinine Clearance: 16 mL/min (A) (by C-G formula based on SCr of 3.84 mg/dL (H)). Recent Labs  Lab 04/16/20 0229 04/16/20 0428 04/16/20 1132 04/16/20 1454 04/17/20 0451 04/18/20 0617 04/19/20 0223  WBC 5.0  --    < > 4.2 4.8 4.4 3.3*  LATICACIDVEN 5.8* 2.9*  --   --   --   --   --    < > = values in this interval not displayed.    Liver Function Tests: Recent Labs  Lab 04/16/20 0229 04/18/20 0617  AST 27  --   ALT 16  --   ALKPHOS 73  --   BILITOT 0.8  --   PROT 4.8*  --   ALBUMIN 2.1* 2.4*   Recent Labs  Lab 04/16/20 0229  LIPASE 24   No results for input(s): AMMONIA in the last 168 hours.  ABG    Component Value Date/Time   PHART 7.453 (H) 12/20/2017 1149   PCO2ART 29.6 (L) 12/20/2017 1149   PO2ART 63.0 (L) 12/20/2017 1149   HCO3  23.5 04/16/2020 0259   TCO2 24 04/16/2020 0259   TCO2 22 04/16/2020 0259   ACIDBASEDEF 3.0 (H) 12/20/2017 1149   O2SAT 100.0 04/16/2020 0259     Coagulation Profile: Recent Labs  Lab 04/16/20 0229  INR 1.6*    Cardiac  Enzymes: No results for input(s): CKTOTAL, CKMB, CKMBINDEX, TROPONINI in the last 168 hours.  HbA1C: Hemoglobin A1C  Date/Time Value Ref Range Status  03/17/2019 10:24 AM 6.8 (A) 4.0 - 5.6 % Final  10/06/2018 08:28 AM 7.6 (A) 4.0 - 5.6 % Final   Hgb A1c MFr Bld  Date/Time Value Ref Range Status  07/09/2018 09:15 AM 7.7 (H) 4.6 - 6.5 % Final    Comment:    Glycemic Control Guidelines for People with Diabetes:Non Diabetic:  <6%Goal of Therapy: <7%Additional Action Suggested:  >8%   03/09/2018 12:29 PM 7.0 (H) 4.8 - 5.6 % Final    Comment:             Prediabetes: 5.7 - 6.4          Diabetes: >6.4          Glycemic control for adults with diabetes: <7.0     CBG: Recent Labs  Lab 04/18/20 0834 04/18/20 1207 04/18/20 1746 04/18/20 2004 04/19/20 0615  GLUCAP 84 85 [place] ACNP Acute Care Nurse Practitioner [place] Please consult Amion 04/19/2020, 11:33 AM

## 2020-04-19 NOTE — Plan of Care (Signed)
I received callback from and  discussed case with Dr. Posey Pronto -- accepting transplant physician at [facility]  -likely that pt will transfer to [facility] today (9/15) -pt actively listed for transplant, and in this setting [place] preference is for transfer to [facility] for interventions as indicated  -If unable to transfer today, will revisit possible BRTO here. Concerns for ongoing deferral given risk for acute decompensation -Tentatively, will ask to slot pt for BRTO tomorrow at [facility]. I would prefer to have this option available should patient not be able to transfer (would again discuss plan w [facility] if does not transfer 9/15 to discuss risks/benefits) -Case discussed with GI and IR-- greatly appreciate ongoing collaboration -closely following up with [facility] transfer center     Eliseo Gum MSN, AGACNP-BC [place] [phone] If no answer, [phone] 04/19/2020, 12:16 PM

## 2020-04-19 NOTE — Anesthesia Postprocedure Evaluation (Signed)
Anesthesia Post Note  Patient: William [name]  Procedure(s) Performed: ESOPHAGOGASTRODUODENOSCOPY (EGD) WITH PROPOFOL (N/A )     Patient location during evaluation: Endoscopy Anesthesia Type: MAC Level of consciousness: awake and alert Pain management: pain level controlled Vital Signs Assessment: post-procedure vital signs reviewed and stable Respiratory status: spontaneous breathing Cardiovascular status: stable Anesthetic complications: no   No complications documented.  Last Vitals:  Vitals:   04/18/20 2301 04/19/20 0301  BP: (!) 163/53 (!) 166/52  Pulse: 80 79  Resp: 18 17  Temp: (!) 36.4 C 37.8 C  SpO2: 100% 99%    Last Pain:  Vitals:   04/19/20 0301  TempSrc: Oral  PainSc:    Pain Goal:                   Nolon Nations

## 2020-04-19 NOTE — Progress Notes (Signed)
Interventional Radiology Brief Note:  IR following for known, precarious gastric varices with need for intervention (BRTO) either here or with [place].  Patient's current care plan well summarized by Eliseo Gum, NP with CCM.  After several discussions today between care teams and providers, all appear to be in agreement that BRTO is indicated and that discharging patient home with varices untreated would leave him at high risk of rebleeding.  As patient has been stable, transfer to [facility] was preferred given his status as a liver transplant patient with established care with [place].  [facility] was consulted and initially a transfer was not possible, however now after re-consult with CCM it appears the patient has been accepted in transfer pending bed availability.  I have requested that IR team determine anesthesia availability for possible procedure tomorrow if patient does not transfer.  NPO p MN unless transferred.   Formal consult to follow pending today's events.   Brynda Greathouse, MS RD PA-C 1:11 PM

## 2020-04-19 NOTE — Plan of Care (Signed)
  Problem: Education: Goal: Knowledge of General Education information will improve Description: Including pain rating scale, medication(s)/side effects and non-pharmacologic comfort measures Outcome: Progressing   Problem: Health Behavior/Discharge Planning: Goal: Ability to manage health-related needs will improve Outcome: Progressing   Problem: Nutrition: Goal: Adequate nutrition will be maintained Outcome: Progressing  Patient able to tolerate 50% or less of full liquid diet, no vomiting this shift.

## 2020-04-19 NOTE — Discharge Summary (Signed)
Physician Discharge Summary  Patient ID: William [name] MRN: [id number] DOB/AGE: 1950-09-18 69 y.o.  Admit date: 04/16/2020 Discharge date: 04/19/2020  Problem List Active Problems:   GIB (gastrointestinal bleeding)  HPI: 69 year old man with history of HFpEF, cirrhosis c/b esophageal varices, ESRD on HD, Goodpasture's disease, hypertension, type 2 diabetes presented after fall.  He is accompanied by his son who helped with providing history.  He has been "spitting up blood" and also had bright red blood per rectum that started today.  Presently he denies chest pain, shortness of breath, abdominal pain.  Per EMS was initially found to be hypotensive.  He was given IV fluids with some improvement in his blood pressure. Hospital Course:  Past Medical History  HFpEF, cirrhosis c/b esophageal varices, ESRD on HD, Goodpasture's disease, hypertension, type 2 diabetes  Significant Hospital Events   9/12> admit  Consults:  9/12 GI renal Procedures:  04/17/20 upper endoscopy  Findings: One large gastric varix was found in the gastric fundus. This varix was ulcerated or erosive with one large erosive and one smaller erosive area on it. There was portal hypertensive gastropathy in the body of the stomach. There was old blood clot in the stomach. The examined duodenum was normal. - Grade I esophageal varices. - Gastric varices. - Normal examined duodenum. - No specimens collected.  Impression: -Has been discussed with GI, IR -- BRTO is option here if not able to transfer to [facility]. Continue octreotide and protonix  -Transferring to [facility] 04/19/20   Significant Diagnostic Tests:  XR abd 9/12>No acute abdominal findings.  XR chest 9/12>No acute cardiopulmonary process.  EGD 9/13 >> grade 1 varices in the middle lower thirds of the esophagus without stigmata of bleeding.  1 large gastric varix at the gastric fundus it was ulcerated or erosive with 1 larger 1 smaller erosive area on it.   Evidence for portal hypertensive gastropathy in the body of the stomach.  Old clot in the stomach.  Duodenum was normal  Micro Data:  COVID 9/12> neg BCx 9/12> neg  Antimicrobials:  ceftriaxone 9/12>   Examination: General:  Chronically ill appearing older adult M, NAD HEENT: MM pink/moist, no jvd Neuro: Grossly intact CV: RRR s1s2 brisk cap refill  PULM:  CTA on RA  GI: soft, bsx4 active  GU: Defer  Extremities: warm, well perfused Skin: no rashes or lesions   Assessment & Plan:  69 year old man with history of HFpEF, cirrhosis c/b esophageal varices, ESRD on HD, Goodpasture's disease, hypertension, type 2 diabetes presented after fall found to have hypotension and GI bleed.  Acute upper GI bleed, with history of cirrhosis.  Evidence for gastric variceal bleeding on EGD 9/13: -Appreciate IR, GI collaboration. BRTO candidate. Listed for transplant; [facility] recommends transfer, and holding possible procedure.  P Bed available at [facility] 9/15. Plan to transfer to [facility] for further management and care Continue octreotide, PPI   ESRD:  iHD   DM: SSI in setting of acute illness     Best practice:  Diet: NPO Pain/Anxiety/Delirium protocol (if indicated): Monitor VAP protocol (if indicated): N/A DVT prophylaxis: SCDs GI prophylaxis:octreotide, protonix Glucose control: SSI   Mobility: OOB Code Status: Full Family Communication: Discussed with patient via video interpreter service. Preferred language: Arabic  Disposition: Transferring to [facility] 04/19/2020, for care under transplant service    Labs at discharge Lab Results  Component Value Date   CREATININE 3.84 (H) 04/19/2020   BUN 26 (H) 04/19/2020   NA 138 04/19/2020   K 4.1 04/19/2020  CL 102 04/19/2020   CO2 26 04/19/2020   Lab Results  Component Value Date   WBC 3.3 (L) 04/19/2020   HGB 9.2 (L) 04/19/2020   HCT 28.9 (L) 04/19/2020   MCV 85.0 04/19/2020   PLT 29 (LL) 04/19/2020   Lab Results   Component Value Date   ALT 16 04/16/2020   AST 27 04/16/2020   ALKPHOS 73 04/16/2020   BILITOT 0.8 04/16/2020   Lab Results  Component Value Date   INR 1.6 (H) 04/16/2020   INR 1.3 (H) 06/02/2019   INR 1.28 01/17/2018    Current radiology studies DG Chest Port 1 View  Result Date: 04/19/2020 CLINICAL DATA:  Abnormal respirations EXAM: PORTABLE CHEST 1 VIEW COMPARISON:  04/16/2020 FINDINGS: New bilateral airspace disease, right greater than left. Heart is borderline in size. Small right pleural effusion. No pneumothorax. IMPRESSION: New bilateral airspace disease, right greater than left. This could reflect asymmetric edema or infection. Small right effusion. Electronically Signed   By: Rolm Baptise M.D.   On: 04/19/2020 08:27    Disposition:  Discharge disposition: [place] Not Defined       Discharge Instructions    Call MD for:   Complete by: As directed    Call MD for:  difficulty breathing, headache or visual disturbances   Complete by: As directed    Call MD for:  extreme fatigue   Complete by: As directed    Call MD for:  hives   Complete by: As directed    Call MD for:  persistant dizziness or light-headedness   Complete by: As directed    Call MD for:  persistant nausea and vomiting   Complete by: As directed    Call MD for:  severe uncontrolled pain   Complete by: As directed    Call MD for:  temperature >100.4   Complete by: As directed    Discharge instructions   Complete by: As directed    Patient is being transferred to [facility] for higher level of care, for mare/management under transplant service.  Discussed with patient.     Allergies as of 04/19/2020      Reactions   Heparin Other (See Comments)   Patient is Muslim and is not permitted Pork derative due to religious belief)   Pork-derived Products Other (See Comments)   Patient does not eat pork due to religious beliefs      Medication List    STOP  taking these medications   acetaminophen 650 MG CR tablet Commonly known as: TYLENOL   ofloxacin 0.3 % OTIC solution Commonly known as: FLOXIN     TAKE these medications   amLODipine 10 MG tablet Commonly known as: NORVASC Take 5 mg by mouth daily.   calcium acetate 667 MG capsule Commonly known as: PHOSLO Take 2 capsules (1,334 mg total) by mouth 3 (three) times daily with meals. What changed: how much to take   carvedilol 12.5 MG tablet Commonly known as: COREG Take 25 mg by mouth 2 (two) times daily.   cefTRIAXone 2 g in sodium chloride 0.9 % 100 mL Inject 2 g into the vein daily for 1 dose.   cetirizine 5 MG tablet Commonly known as: ZYRTEC Take 1 tablet (5 mg total) by mouth 3 (three) times a week. Before dialysis   doxycycline 100 MG tablet Commonly known as: VIBRA-TABS Take 1 tablet (100 mg total) by mouth 2 (two) times daily.   Insulin Pen Needle 31G  X 5 MM Misc Commonly known as: Advocate Insulin Pen Needles Use four times daily to inject insulin   insulin regular 100 units/mL injection Commonly known as: NOVOLIN R Inject 8-10 Units into the skin See admin instructions. INJECT 8 UNITS UNDER THE SKIN AT BREAKFAST, 10 UNITS AT LUNCH AND 8 UNITS AT DINNER.   lidocaine-prilocaine cream Commonly known as: EMLA Apply 1 application topically Every Tuesday,Thursday,and Saturday with dialysis.   loperamide 2 MG tablet Commonly known as: Imodium A-D Take 1 tablet (2 mg total) by mouth 4 (four) times daily as needed for diarrhea or loose stools.   multivitamin with minerals tablet Take 1 tablet by mouth daily.   mupirocin ointment 2 % Commonly known as: BACTROBAN Apply 1 application topically 2 (two) times daily.   octreotide 500 mcg in sodium chloride 0.9 % 250 mL Inject 50 mcg/hr into the vein continuous for 1 day.   ondansetron 4 MG disintegrating tablet Commonly known as: Zofran ODT Take 1 tablet (4 mg total) by mouth every 8 (eight) hours as needed for  nausea or vomiting. Dissolves in mouth   ONE TOUCH LANCETS Misc Use to test blood sugar three times daily   OneTouch Verio test strip Generic drug: glucose blood USE 1 STRIP TO CHECK GLUCOSE THREE TIMES DAILY. DX:E11.65   pantoprazole 40 MG injection Commonly known as: PROTONIX Inject 40 mg into the vein every 12 (twelve) hours for 1 day.   rosuvastatin 5 MG tablet Commonly known as: Crestor Take 1 tablet (5 mg total) by mouth daily.   Systane 0.4-0.3 % Soln Generic drug: Polyethyl Glycol-Propyl Glycol Place 1 drop into both eyes 3 (three) times daily as needed (for dry eyes).   [name] 100 UNIT/ML FlexTouch Pen Generic drug: insulin degludec Inject 0.14 mLs (14 Units total) into the skin daily. Inject 14 units under the skin once daily. What changed:   how much to take  additional instructions         Discharged Condition: fair  Time spent on discharge 40 minutes.  Vital signs at Discharge. Temp:  [97.5 F (36.4 C)-100 F (37.8 C)] 98.9 F (37.2 C) (09/15 1610) Pulse Rate:  [79-85] 84 (09/15 1610) Resp:  [15-24] 18 (09/15 1610) BP: (157-185)/(46-99) 181/54 (09/15 1610) SpO2:  [93 %-100 %] 98 % (09/15 1610)    Office follow up Special Information or instructions. Per [facility]   Signed:  Eliseo Gum MSN, AGACNP-BC [place] [phone] If no answer, [phone] 04/19/2020, 4:41 PM

## 2020-04-19 NOTE — Progress Notes (Signed)
  Received call from [facility] transfer center -- bed available and pt to be transferred.  D/w bedside RN who may be called for report.    Eliseo Gum MSN, AGACNP-BC [place] [phone] If no answer, [phone] 04/19/2020, 4:06 PM

## 2020-04-19 NOTE — Progress Notes (Signed)
Inpatient Diabetes Program Recommendations  AACE/ADA: New Consensus Statement on Inpatient Glycemic Control  Target Ranges:  Prepandial:   less than 140 mg/dL      Peak postprandial:   less than 180 mg/dL (1-2 hours)      Critically ill patients:  140 - 180 mg/dL   Results for William [name], William [name] (MRN [id number]) as of 04/19/2020 13:17  Ref. Range 04/18/2020 08:34 04/18/2020 12:07 04/18/2020 17:46 04/18/2020 20:04 04/19/2020 06:15 04/19/2020 13:02  Glucose-Capillary Latest Ref Range: 70 - 99 mg/dL 84 85 328 (H) 256 (H) 111 (H) 276 (H)   Review of Glycemic Control  Diabetes history: DM2  Outpatient Diabetes medications: Tresiba 12 units daily, Regular 8 units with breakfast, 10 units with lunch, 8 units with supper Current orders for Inpatient glycemic control: Novolog 0-15 units TID with meals, Novolog 0-5 units QHS  Inpatient Diabetes Program Recommendations:    Insulin: Please consider ordering Novolog 3 units TID with meals for meal coverage if patient eats at least 50% of meals.  Thanks, Barnie Alderman, RN, MSN, CDE Diabetes Coordinator Inpatient Diabetes Program [phone] (Team Pager from 8am to 5pm)

## 2020-04-19 NOTE — Progress Notes (Signed)
The patient was seen today.  No signs of active GI bleeding at this time.  Discussed case with Dr. Earleen Newport from interventional radiology and both he and I think that this patient would be a candidate and needs a prophylactic BRTO to prevent gastric variceal bleeding.  This could be done either here or at [facility].  Discussed also with nurse practitioner who is seeing him currently from critical care and she has been in conversation with [facility] today and they are trying to get him transferred to [facility] hepatology today.  If he is not transferred I think we will need to proceed with the BRTO here.  Would continue octreotide drip until procedure is done.

## 2020-04-19 NOTE — Progress Notes (Signed)
Report called to RN Richardson Landry at [facility] at [facility].

## 2020-04-20 DIAGNOSIS — R188 Other ascites: Secondary | ICD-10-CM | POA: Diagnosis not present

## 2020-04-20 DIAGNOSIS — Z794 Long term (current) use of insulin: Secondary | ICD-10-CM | POA: Diagnosis not present

## 2020-04-20 DIAGNOSIS — I10 Essential (primary) hypertension: Secondary | ICD-10-CM | POA: Diagnosis not present

## 2020-04-20 DIAGNOSIS — E119 Type 2 diabetes mellitus without complications: Secondary | ICD-10-CM | POA: Diagnosis not present

## 2020-04-20 DIAGNOSIS — I8500 Esophageal varices without bleeding: Secondary | ICD-10-CM | POA: Diagnosis not present

## 2020-04-20 DIAGNOSIS — N186 End stage renal disease: Secondary | ICD-10-CM | POA: Diagnosis not present

## 2020-04-20 DIAGNOSIS — Z01818 Encounter for other preprocedural examination: Secondary | ICD-10-CM | POA: Diagnosis not present

## 2020-04-20 DIAGNOSIS — K922 Gastrointestinal hemorrhage, unspecified: Secondary | ICD-10-CM | POA: Diagnosis not present

## 2020-04-20 DIAGNOSIS — I864 Gastric varices: Secondary | ICD-10-CM | POA: Diagnosis not present

## 2020-04-20 DIAGNOSIS — K7460 Unspecified cirrhosis of liver: Secondary | ICD-10-CM | POA: Diagnosis not present

## 2020-04-20 DIAGNOSIS — Z992 Dependence on renal dialysis: Secondary | ICD-10-CM | POA: Diagnosis not present

## 2020-04-21 DIAGNOSIS — I864 Gastric varices: Secondary | ICD-10-CM | POA: Diagnosis not present

## 2020-04-21 DIAGNOSIS — R188 Other ascites: Secondary | ICD-10-CM | POA: Diagnosis not present

## 2020-04-21 DIAGNOSIS — Z992 Dependence on renal dialysis: Secondary | ICD-10-CM | POA: Diagnosis not present

## 2020-04-21 DIAGNOSIS — R8279 Other abnormal findings on microbiological examination of urine: Secondary | ICD-10-CM | POA: Insufficient documentation

## 2020-04-21 DIAGNOSIS — K7460 Unspecified cirrhosis of liver: Secondary | ICD-10-CM | POA: Diagnosis not present

## 2020-04-21 DIAGNOSIS — M310 Hypersensitivity angiitis: Secondary | ICD-10-CM | POA: Diagnosis not present

## 2020-04-21 DIAGNOSIS — Z95828 Presence of other vascular implants and grafts: Secondary | ICD-10-CM | POA: Diagnosis not present

## 2020-04-21 DIAGNOSIS — N186 End stage renal disease: Secondary | ICD-10-CM | POA: Diagnosis not present

## 2020-04-21 DIAGNOSIS — K922 Gastrointestinal hemorrhage, unspecified: Secondary | ICD-10-CM | POA: Diagnosis not present

## 2020-04-21 DIAGNOSIS — Z01818 Encounter for other preprocedural examination: Secondary | ICD-10-CM | POA: Diagnosis not present

## 2020-04-21 DIAGNOSIS — I8500 Esophageal varices without bleeding: Secondary | ICD-10-CM | POA: Diagnosis not present

## 2020-04-21 LAB — CULTURE, BLOOD (SINGLE): Culture: NO GROWTH

## 2020-04-22 DIAGNOSIS — N186 End stage renal disease: Secondary | ICD-10-CM | POA: Diagnosis not present

## 2020-04-22 DIAGNOSIS — R509 Fever, unspecified: Secondary | ICD-10-CM | POA: Diagnosis not present

## 2020-04-22 DIAGNOSIS — R8279 Other abnormal findings on microbiological examination of urine: Secondary | ICD-10-CM | POA: Diagnosis not present

## 2020-04-22 DIAGNOSIS — J811 Chronic pulmonary edema: Secondary | ICD-10-CM | POA: Diagnosis not present

## 2020-04-22 DIAGNOSIS — K7460 Unspecified cirrhosis of liver: Secondary | ICD-10-CM | POA: Diagnosis not present

## 2020-04-22 DIAGNOSIS — I864 Gastric varices: Secondary | ICD-10-CM | POA: Diagnosis not present

## 2020-04-22 DIAGNOSIS — R188 Other ascites: Secondary | ICD-10-CM | POA: Diagnosis not present

## 2020-04-22 DIAGNOSIS — M310 Hypersensitivity angiitis: Secondary | ICD-10-CM | POA: Diagnosis not present

## 2020-04-22 DIAGNOSIS — Z01818 Encounter for other preprocedural examination: Secondary | ICD-10-CM | POA: Diagnosis not present

## 2020-04-22 DIAGNOSIS — K922 Gastrointestinal hemorrhage, unspecified: Secondary | ICD-10-CM | POA: Diagnosis not present

## 2020-04-22 DIAGNOSIS — Z95828 Presence of other vascular implants and grafts: Secondary | ICD-10-CM | POA: Diagnosis not present

## 2020-04-22 DIAGNOSIS — Z992 Dependence on renal dialysis: Secondary | ICD-10-CM | POA: Diagnosis not present

## 2020-04-22 DIAGNOSIS — I8500 Esophageal varices without bleeding: Secondary | ICD-10-CM | POA: Diagnosis not present

## 2020-04-24 DIAGNOSIS — Z7682 Awaiting organ transplant status: Secondary | ICD-10-CM | POA: Diagnosis not present

## 2020-04-24 DIAGNOSIS — R5381 Other malaise: Secondary | ICD-10-CM | POA: Diagnosis not present

## 2020-04-24 DIAGNOSIS — K7460 Unspecified cirrhosis of liver: Secondary | ICD-10-CM | POA: Diagnosis not present

## 2020-04-24 DIAGNOSIS — Z992 Dependence on renal dialysis: Secondary | ICD-10-CM | POA: Diagnosis not present

## 2020-04-24 DIAGNOSIS — N186 End stage renal disease: Secondary | ICD-10-CM | POA: Diagnosis not present

## 2020-04-24 DIAGNOSIS — Z95828 Presence of other vascular implants and grafts: Secondary | ICD-10-CM | POA: Diagnosis not present

## 2020-04-24 DIAGNOSIS — E1121 Type 2 diabetes mellitus with diabetic nephropathy: Secondary | ICD-10-CM | POA: Diagnosis not present

## 2020-04-24 DIAGNOSIS — M310 Hypersensitivity angiitis: Secondary | ICD-10-CM | POA: Diagnosis not present

## 2020-04-24 DIAGNOSIS — R188 Other ascites: Secondary | ICD-10-CM | POA: Diagnosis not present

## 2020-04-24 DIAGNOSIS — I864 Gastric varices: Secondary | ICD-10-CM | POA: Diagnosis not present

## 2020-04-24 DIAGNOSIS — I8500 Esophageal varices without bleeding: Secondary | ICD-10-CM | POA: Diagnosis not present

## 2020-04-24 DIAGNOSIS — Z9189 Other specified personal risk factors, not elsewhere classified: Secondary | ICD-10-CM | POA: Diagnosis not present

## 2020-04-25 ENCOUNTER — Other Ambulatory Visit: Payer: Self-pay | Admitting: Endocrinology

## 2020-04-25 DIAGNOSIS — R8279 Other abnormal findings on microbiological examination of urine: Secondary | ICD-10-CM | POA: Diagnosis not present

## 2020-04-25 DIAGNOSIS — Z992 Dependence on renal dialysis: Secondary | ICD-10-CM | POA: Diagnosis not present

## 2020-04-25 DIAGNOSIS — R188 Other ascites: Secondary | ICD-10-CM | POA: Diagnosis not present

## 2020-04-25 DIAGNOSIS — K7460 Unspecified cirrhosis of liver: Secondary | ICD-10-CM | POA: Diagnosis not present

## 2020-04-25 DIAGNOSIS — I864 Gastric varices: Secondary | ICD-10-CM | POA: Diagnosis not present

## 2020-04-25 DIAGNOSIS — N186 End stage renal disease: Secondary | ICD-10-CM | POA: Diagnosis not present

## 2020-04-25 DIAGNOSIS — R509 Fever, unspecified: Secondary | ICD-10-CM | POA: Diagnosis not present

## 2020-04-25 DIAGNOSIS — M310 Hypersensitivity angiitis: Secondary | ICD-10-CM | POA: Diagnosis not present

## 2020-04-26 DIAGNOSIS — R188 Other ascites: Secondary | ICD-10-CM | POA: Diagnosis not present

## 2020-04-26 DIAGNOSIS — N186 End stage renal disease: Secondary | ICD-10-CM | POA: Diagnosis not present

## 2020-04-26 DIAGNOSIS — D869 Sarcoidosis, unspecified: Secondary | ICD-10-CM | POA: Diagnosis not present

## 2020-04-26 DIAGNOSIS — I8500 Esophageal varices without bleeding: Secondary | ICD-10-CM | POA: Diagnosis not present

## 2020-04-26 DIAGNOSIS — Z01818 Encounter for other preprocedural examination: Secondary | ICD-10-CM | POA: Diagnosis not present

## 2020-04-26 DIAGNOSIS — J90 Pleural effusion, not elsewhere classified: Secondary | ICD-10-CM | POA: Diagnosis not present

## 2020-04-26 DIAGNOSIS — M310 Hypersensitivity angiitis: Secondary | ICD-10-CM | POA: Diagnosis not present

## 2020-04-26 DIAGNOSIS — Z794 Long term (current) use of insulin: Secondary | ICD-10-CM | POA: Diagnosis not present

## 2020-04-26 DIAGNOSIS — R8279 Other abnormal findings on microbiological examination of urine: Secondary | ICD-10-CM | POA: Diagnosis not present

## 2020-04-26 DIAGNOSIS — K7460 Unspecified cirrhosis of liver: Secondary | ICD-10-CM | POA: Diagnosis not present

## 2020-04-26 DIAGNOSIS — E1121 Type 2 diabetes mellitus with diabetic nephropathy: Secondary | ICD-10-CM | POA: Diagnosis not present

## 2020-04-26 DIAGNOSIS — I864 Gastric varices: Secondary | ICD-10-CM | POA: Diagnosis not present

## 2020-04-26 DIAGNOSIS — Z992 Dependence on renal dialysis: Secondary | ICD-10-CM | POA: Diagnosis not present

## 2020-04-26 DIAGNOSIS — R509 Fever, unspecified: Secondary | ICD-10-CM | POA: Diagnosis not present

## 2020-04-27 DIAGNOSIS — R8279 Other abnormal findings on microbiological examination of urine: Secondary | ICD-10-CM | POA: Diagnosis not present

## 2020-04-27 DIAGNOSIS — R5381 Other malaise: Secondary | ICD-10-CM | POA: Insufficient documentation

## 2020-04-27 DIAGNOSIS — Z944 Liver transplant status: Secondary | ICD-10-CM | POA: Insufficient documentation

## 2020-04-27 DIAGNOSIS — K7460 Unspecified cirrhosis of liver: Secondary | ICD-10-CM | POA: Diagnosis not present

## 2020-04-27 DIAGNOSIS — I864 Gastric varices: Secondary | ICD-10-CM | POA: Diagnosis not present

## 2020-04-27 DIAGNOSIS — N186 End stage renal disease: Secondary | ICD-10-CM | POA: Diagnosis not present

## 2020-04-27 DIAGNOSIS — M310 Hypersensitivity angiitis: Secondary | ICD-10-CM | POA: Diagnosis not present

## 2020-04-27 DIAGNOSIS — R188 Other ascites: Secondary | ICD-10-CM | POA: Diagnosis not present

## 2020-04-27 DIAGNOSIS — Z7682 Awaiting organ transplant status: Secondary | ICD-10-CM | POA: Insufficient documentation

## 2020-04-27 DIAGNOSIS — R509 Fever, unspecified: Secondary | ICD-10-CM | POA: Diagnosis not present

## 2020-04-27 DIAGNOSIS — Z992 Dependence on renal dialysis: Secondary | ICD-10-CM | POA: Diagnosis not present

## 2020-04-27 DIAGNOSIS — R34 Anuria and oliguria: Secondary | ICD-10-CM | POA: Insufficient documentation

## 2020-04-27 DIAGNOSIS — Z95828 Presence of other vascular implants and grafts: Secondary | ICD-10-CM | POA: Insufficient documentation

## 2020-05-01 ENCOUNTER — Other Ambulatory Visit: Payer: Self-pay

## 2020-05-01 ENCOUNTER — Encounter (HOSPITAL_COMMUNITY): Payer: Self-pay

## 2020-05-01 ENCOUNTER — Other Ambulatory Visit: Payer: Self-pay | Admitting: Endocrinology

## 2020-05-01 ENCOUNTER — Ambulatory Visit (HOSPITAL_COMMUNITY)
Admission: EM | Admit: 2020-05-01 | Discharge: 2020-05-01 | Disposition: A | Discharge status: Home / Self Care | Payer: Medicare Other | Attending: Family Medicine | Admitting: Family Medicine

## 2020-05-01 DIAGNOSIS — R0989 Other specified symptoms and signs involving the circulatory and respiratory systems: Secondary | ICD-10-CM | POA: Insufficient documentation

## 2020-05-01 DIAGNOSIS — R112 Nausea with vomiting, unspecified: Secondary | ICD-10-CM | POA: Diagnosis not present

## 2020-05-01 DIAGNOSIS — R197 Diarrhea, unspecified: Secondary | ICD-10-CM | POA: Diagnosis not present

## 2020-05-01 DIAGNOSIS — R12 Heartburn: Secondary | ICD-10-CM | POA: Diagnosis not present

## 2020-05-01 LAB — CBC
HCT: 31 % — ABNORMAL LOW (ref 39.0–52.0)
Hemoglobin: 9.7 g/dL — ABNORMAL LOW (ref 13.0–17.0)
MCH: 26.5 pg (ref 26.0–34.0)
MCHC: 31.3 g/dL (ref 30.0–36.0)
MCV: 84.7 fL (ref 80.0–100.0)
Platelets: 77 10*3/uL — ABNORMAL LOW (ref 150–400)
RBC: 3.66 MIL/uL — ABNORMAL LOW (ref 4.22–5.81)
RDW: 18.6 % — ABNORMAL HIGH (ref 11.5–15.5)
WBC: 3.8 10*3/uL — ABNORMAL LOW (ref 4.0–10.5)
nRBC: 0 % (ref 0.0–0.2)

## 2020-05-01 LAB — BASIC METABOLIC PANEL
Anion gap: 12 (ref 5–15)
BUN: 31 mg/dL — ABNORMAL HIGH (ref 8–23)
CO2: 23 mmol/L (ref 22–32)
Calcium: 8.9 mg/dL (ref 8.9–10.3)
Chloride: 102 mmol/L (ref 98–111)
Creatinine, Ser: 6.38 mg/dL — ABNORMAL HIGH (ref 0.61–1.24)
GFR calc Af Amer: 9 mL/min — ABNORMAL LOW (ref >60)
GFR calc non Af Amer: 8 mL/min — ABNORMAL LOW (ref >60)
Glucose, Bld: 281 mg/dL — ABNORMAL HIGH (ref 70–99)
Potassium: 4.2 mmol/L (ref 3.5–5.1)
Sodium: 137 mmol/L (ref 135–145)

## 2020-05-01 LAB — CBG MONITORING, ED: Glucose-Capillary: 261 mg/dL — ABNORMAL HIGH (ref 70–99)

## 2020-05-01 MED ORDER — LOPERAMIDE HCL 2 MG PO CAPS: 4.0000 mg | ORAL_CAPSULE | ORAL | 0 refills | Status: DC | PRN

## 2020-05-01 MED ORDER — ONDANSETRON 4 MG PO TBDP: 4.0000 mg | ORAL_TABLET | Freq: Two times a day (BID) | ORAL | 0 refills | Status: AC | PRN

## 2020-05-01 NOTE — Discharge Instructions (Signed)
Zofran for nausea Imodium 4 mg for diarrhea Blood work pending- I will call if abnormal If symptoms worsening go to emergency room Follow up outpatient as planned

## 2020-05-01 NOTE — ED Triage Notes (Signed)
Pt states he has nausea/vomiting since yesterday. Pt states he has no abdominal pain. Pt is aox4 and ambulatory at baseline.

## 2020-05-02 ENCOUNTER — Inpatient Hospital Stay (HOSPITAL_COMMUNITY)
Admission: EM | Admit: 2020-05-02 | Discharge: 2020-05-04 | DRG: 441 | Disposition: A | Discharge status: Home / Self Care | Payer: Medicare Other | Attending: Internal Medicine | Admitting: Internal Medicine

## 2020-05-02 ENCOUNTER — Inpatient Hospital Stay: Payer: Medicare Other | Admitting: Registered Nurse

## 2020-05-02 ENCOUNTER — Other Ambulatory Visit: Payer: Self-pay

## 2020-05-02 ENCOUNTER — Emergency Department (HOSPITAL_COMMUNITY): Payer: Medicare Other

## 2020-05-02 ENCOUNTER — Encounter (HOSPITAL_COMMUNITY): Payer: Self-pay | Admitting: Internal Medicine

## 2020-05-02 DIAGNOSIS — M311 Thrombotic microangiopathy, unspecified: Secondary | ICD-10-CM | POA: Insufficient documentation

## 2020-05-02 DIAGNOSIS — I509 Heart failure, unspecified: Secondary | ICD-10-CM

## 2020-05-02 DIAGNOSIS — Z8249 Family history of ischemic heart disease and other diseases of the circulatory system: Secondary | ICD-10-CM | POA: Diagnosis not present

## 2020-05-02 DIAGNOSIS — R188 Other ascites: Secondary | ICD-10-CM | POA: Diagnosis not present

## 2020-05-02 DIAGNOSIS — K7460 Unspecified cirrhosis of liver: Secondary | ICD-10-CM

## 2020-05-02 DIAGNOSIS — K760 Fatty (change of) liver, not elsewhere classified: Secondary | ICD-10-CM | POA: Diagnosis not present

## 2020-05-02 DIAGNOSIS — Z833 Family history of diabetes mellitus: Secondary | ICD-10-CM | POA: Diagnosis not present

## 2020-05-02 DIAGNOSIS — D6959 Other secondary thrombocytopenia: Secondary | ICD-10-CM | POA: Diagnosis present

## 2020-05-02 DIAGNOSIS — Z91018 Allergy to other foods: Secondary | ICD-10-CM

## 2020-05-02 DIAGNOSIS — Z7682 Awaiting organ transplant status: Secondary | ICD-10-CM | POA: Diagnosis not present

## 2020-05-02 DIAGNOSIS — I132 Hypertensive heart and chronic kidney disease with heart failure and with stage 5 chronic kidney disease, or end stage renal disease: Secondary | ICD-10-CM | POA: Diagnosis not present

## 2020-05-02 DIAGNOSIS — R042 Hemoptysis: Secondary | ICD-10-CM | POA: Diagnosis not present

## 2020-05-02 DIAGNOSIS — N2581 Secondary hyperparathyroidism of renal origin: Secondary | ICD-10-CM | POA: Diagnosis not present

## 2020-05-02 DIAGNOSIS — N186 End stage renal disease: Secondary | ICD-10-CM

## 2020-05-02 DIAGNOSIS — G319 Degenerative disease of nervous system, unspecified: Secondary | ICD-10-CM | POA: Diagnosis not present

## 2020-05-02 DIAGNOSIS — Z6824 Body mass index (BMI) 24.0-24.9, adult: Secondary | ICD-10-CM

## 2020-05-02 DIAGNOSIS — I120 Hypertensive chronic kidney disease with stage 5 chronic kidney disease or end stage renal disease: Secondary | ICD-10-CM | POA: Diagnosis not present

## 2020-05-02 DIAGNOSIS — R059 Cough, unspecified: Secondary | ICD-10-CM

## 2020-05-02 DIAGNOSIS — I5032 Chronic diastolic (congestive) heart failure: Secondary | ICD-10-CM | POA: Diagnosis present

## 2020-05-02 DIAGNOSIS — H5440 Blindness, one eye, unspecified eye: Secondary | ICD-10-CM | POA: Diagnosis present

## 2020-05-02 DIAGNOSIS — E1121 Type 2 diabetes mellitus with diabetic nephropathy: Secondary | ICD-10-CM | POA: Diagnosis not present

## 2020-05-02 DIAGNOSIS — Z515 Encounter for palliative care: Secondary | ICD-10-CM

## 2020-05-02 DIAGNOSIS — D631 Anemia in chronic kidney disease: Secondary | ICD-10-CM | POA: Diagnosis present

## 2020-05-02 DIAGNOSIS — Z992 Dependence on renal dialysis: Secondary | ICD-10-CM

## 2020-05-02 DIAGNOSIS — K7290 Hepatic failure, unspecified without coma: Principal | ICD-10-CM | POA: Diagnosis present

## 2020-05-02 DIAGNOSIS — E7220 Disorder of urea cycle metabolism, unspecified: Secondary | ICD-10-CM

## 2020-05-02 DIAGNOSIS — K219 Gastro-esophageal reflux disease without esophagitis: Secondary | ICD-10-CM | POA: Diagnosis not present

## 2020-05-02 DIAGNOSIS — D696 Thrombocytopenia, unspecified: Secondary | ICD-10-CM | POA: Diagnosis present

## 2020-05-02 DIAGNOSIS — E1165 Type 2 diabetes mellitus with hyperglycemia: Secondary | ICD-10-CM | POA: Diagnosis not present

## 2020-05-02 DIAGNOSIS — Z823 Family history of stroke: Secondary | ICD-10-CM | POA: Diagnosis not present

## 2020-05-02 DIAGNOSIS — M310 Hypersensitivity angiitis: Secondary | ICD-10-CM | POA: Diagnosis not present

## 2020-05-02 DIAGNOSIS — E46 Unspecified protein-calorie malnutrition: Secondary | ICD-10-CM | POA: Diagnosis not present

## 2020-05-02 DIAGNOSIS — Z7189 Other specified counseling: Secondary | ICD-10-CM

## 2020-05-02 DIAGNOSIS — R404 Transient alteration of awareness: Secondary | ICD-10-CM | POA: Diagnosis not present

## 2020-05-02 DIAGNOSIS — Z20822 Contact with and (suspected) exposure to covid-19: Secondary | ICD-10-CM | POA: Diagnosis present

## 2020-05-02 DIAGNOSIS — R58 Hemorrhage, not elsewhere classified: Secondary | ICD-10-CM | POA: Diagnosis not present

## 2020-05-02 DIAGNOSIS — E1122 Type 2 diabetes mellitus with diabetic chronic kidney disease: Secondary | ICD-10-CM | POA: Diagnosis not present

## 2020-05-02 DIAGNOSIS — E1129 Type 2 diabetes mellitus with other diabetic kidney complication: Secondary | ICD-10-CM | POA: Diagnosis present

## 2020-05-02 DIAGNOSIS — Z888 Allergy status to other drugs, medicaments and biological substances status: Secondary | ICD-10-CM

## 2020-05-02 DIAGNOSIS — I10 Essential (primary) hypertension: Secondary | ICD-10-CM | POA: Diagnosis present

## 2020-05-02 DIAGNOSIS — R0989 Other specified symptoms and signs involving the circulatory and respiratory systems: Secondary | ICD-10-CM | POA: Diagnosis not present

## 2020-05-02 DIAGNOSIS — Z794 Long term (current) use of insulin: Secondary | ICD-10-CM

## 2020-05-02 DIAGNOSIS — K7682 Hepatic encephalopathy: Secondary | ICD-10-CM | POA: Diagnosis present

## 2020-05-02 DIAGNOSIS — R410 Disorientation, unspecified: Secondary | ICD-10-CM | POA: Diagnosis not present

## 2020-05-02 DIAGNOSIS — I7090 Unspecified atherosclerosis: Secondary | ICD-10-CM | POA: Diagnosis not present

## 2020-05-02 DIAGNOSIS — Z79899 Other long term (current) drug therapy: Secondary | ICD-10-CM

## 2020-05-02 DIAGNOSIS — R509 Fever, unspecified: Secondary | ICD-10-CM | POA: Diagnosis not present

## 2020-05-02 DIAGNOSIS — R4182 Altered mental status, unspecified: Secondary | ICD-10-CM | POA: Diagnosis not present

## 2020-05-02 LAB — COMPREHENSIVE METABOLIC PANEL
ALT: 23 U/L (ref 0–44)
AST: 34 U/L (ref 15–41)
Albumin: 2.4 g/dL — ABNORMAL LOW (ref 3.5–5.0)
Alkaline Phosphatase: 100 U/L (ref 38–126)
Anion gap: 14 (ref 5–15)
BUN: 17 mg/dL (ref 8–23)
CO2: 22 mmol/L (ref 22–32)
Calcium: 9.3 mg/dL (ref 8.9–10.3)
Chloride: 100 mmol/L (ref 98–111)
Creatinine, Ser: 3.9 mg/dL — ABNORMAL HIGH (ref 0.61–1.24)
GFR calc Af Amer: 17 mL/min — ABNORMAL LOW (ref >60)
GFR calc non Af Amer: 15 mL/min — ABNORMAL LOW (ref >60)
Glucose, Bld: 205 mg/dL — ABNORMAL HIGH (ref 70–99)
Potassium: 4.4 mmol/L (ref 3.5–5.1)
Sodium: 136 mmol/L (ref 135–145)
Total Bilirubin: 2 mg/dL — ABNORMAL HIGH (ref 0.3–1.2)
Total Protein: 6.3 g/dL — ABNORMAL LOW (ref 6.5–8.1)

## 2020-05-02 LAB — HEMOGLOBIN A1C
Hgb A1c MFr Bld: 6.1 % — ABNORMAL HIGH (ref 4.8–5.6)
Mean Plasma Glucose: 128.37 mg/dL

## 2020-05-02 LAB — CBC
HCT: 30 % — ABNORMAL LOW (ref 39.0–52.0)
Hemoglobin: 9.8 g/dL — ABNORMAL LOW (ref 13.0–17.0)
MCH: 27.4 pg (ref 26.0–34.0)
MCHC: 32.7 g/dL (ref 30.0–36.0)
MCV: 83.8 fL (ref 80.0–100.0)
Platelets: 57 10*3/uL — ABNORMAL LOW (ref 150–400)
RBC: 3.58 MIL/uL — ABNORMAL LOW (ref 4.22–5.81)
RDW: 18.4 % — ABNORMAL HIGH (ref 11.5–15.5)
WBC: 4.6 10*3/uL (ref 4.0–10.5)
nRBC: 0 % (ref 0.0–0.2)

## 2020-05-02 LAB — RESPIRATORY PANEL BY RT PCR (FLU A&B, COVID)
Influenza A by PCR: NEGATIVE
Influenza B by PCR: NEGATIVE
SARS Coronavirus 2 by RT PCR: NEGATIVE

## 2020-05-02 LAB — AMMONIA: Ammonia: 67 umol/L — ABNORMAL HIGH (ref 9–35)

## 2020-05-02 LAB — PROTIME-INR
INR: 1.2 (ref 0.8–1.2)
Prothrombin Time: 14.8 seconds (ref 11.4–15.2)

## 2020-05-02 LAB — CBG MONITORING, ED: Glucose-Capillary: 222 mg/dL — ABNORMAL HIGH (ref 70–99)

## 2020-05-02 LAB — GLUCOSE, CAPILLARY: Glucose-Capillary: 168 mg/dL — ABNORMAL HIGH (ref 70–99)

## 2020-05-02 MED ORDER — LACTULOSE 10 GM/15ML PO SOLN
20.0000 g | Freq: Once | ORAL | Status: DC
  Filled 2020-05-02 (×2): qty 30

## 2020-05-02 MED ORDER — ACETAMINOPHEN 650 MG RE SUPP: 650.0000 mg | Freq: Four times a day (QID) | RECTAL | Status: DC | PRN

## 2020-05-02 MED ORDER — INSULIN DEGLUDEC 100 UNIT/ML ~~LOC~~ SOPN: 7.0000 [IU] | PEN_INJECTOR | Freq: Every day | SUBCUTANEOUS | Status: DC

## 2020-05-02 MED ORDER — INSULIN GLARGINE 100 UNIT/ML ~~LOC~~ SOLN
7.0000 [IU] | Freq: Every day | SUBCUTANEOUS | Status: DC
  Filled 2020-05-02 (×2): qty 0.07

## 2020-05-02 MED ORDER — ONDANSETRON HCL 4 MG/2ML IJ SOLN: 4.0000 mg | Freq: Four times a day (QID) | INTRAMUSCULAR | Status: DC | PRN

## 2020-05-02 MED ORDER — LACTULOSE 10 GM/15ML PO SOLN
30.0000 g | Freq: Once | ORAL | Status: AC
  Administered 2020-05-02: 30 g via ORAL

## 2020-05-02 MED ORDER — AMLODIPINE BESYLATE 5 MG PO TABS
5.0000 mg | ORAL_TABLET | Freq: Every day | ORAL | Status: DC
  Administered 2020-05-03: 5 mg via ORAL
  Filled 2020-05-02: qty 1

## 2020-05-02 MED ORDER — POLYETHYL GLYCOL-PROPYL GLYCOL 0.4-0.3 % OP SOLN: 1.0000 [drp] | Freq: Three times a day (TID) | OPHTHALMIC | Status: DC | PRN

## 2020-05-02 MED ORDER — LACTULOSE 10 GM/15ML PO SOLN
30.0000 g | Freq: Three times a day (TID) | ORAL | Status: DC
  Administered 2020-05-02: 30 g via ORAL
  Filled 2020-05-02: qty 45

## 2020-05-02 MED ORDER — ROSUVASTATIN CALCIUM 5 MG PO TABS
5.0000 mg | ORAL_TABLET | Freq: Every day | ORAL | Status: DC
  Administered 2020-05-03: 5 mg via ORAL
  Filled 2020-05-02: qty 1

## 2020-05-02 MED ORDER — ONDANSETRON HCL 4 MG PO TABS: 4.0000 mg | ORAL_TABLET | Freq: Four times a day (QID) | ORAL | Status: DC | PRN

## 2020-05-02 MED ORDER — INSULIN ASPART 100 UNIT/ML ~~LOC~~ SOLN
0.0000 [IU] | Freq: Three times a day (TID) | SUBCUTANEOUS | Status: DC
  Administered 2020-05-02: 2 [IU] via SUBCUTANEOUS
  Administered 2020-05-03 (×3): 3 [IU] via SUBCUTANEOUS
  Administered 2020-05-04: 1 [IU] via SUBCUTANEOUS

## 2020-05-02 MED ORDER — POLYVINYL ALCOHOL 1.4 % OP SOLN: 1.0000 [drp] | Freq: Three times a day (TID) | OPHTHALMIC | Status: DC | PRN

## 2020-05-02 MED ORDER — PANTOPRAZOLE SODIUM 40 MG IV SOLR
40.0000 mg | INTRAVENOUS | Status: DC
  Administered 2020-05-02: 40 mg via INTRAVENOUS
  Filled 2020-05-02: qty 40

## 2020-05-02 MED ORDER — CALCIUM ACETATE (PHOS BINDER) 667 MG PO CAPS
667.0000 mg | ORAL_CAPSULE | Freq: Three times a day (TID) | ORAL | Status: DC
  Administered 2020-05-03 – 2020-05-04 (×4): 667 mg via ORAL
  Filled 2020-05-02 (×5): qty 1

## 2020-05-02 MED ORDER — INSULIN ASPART 100 UNIT/ML ~~LOC~~ SOLN
0.0000 [IU] | Freq: Every day | SUBCUTANEOUS | Status: DC
  Administered 2020-05-03: 2 [IU] via SUBCUTANEOUS

## 2020-05-02 MED ORDER — CARVEDILOL 25 MG PO TABS
25.0000 mg | ORAL_TABLET | Freq: Two times a day (BID) | ORAL | Status: DC
  Administered 2020-05-02 – 2020-05-03 (×3): 25 mg via ORAL
  Filled 2020-05-02 (×3): qty 1

## 2020-05-02 MED ORDER — ACETAMINOPHEN 325 MG PO TABS: 650.0000 mg | ORAL_TABLET | Freq: Four times a day (QID) | ORAL | Status: DC | PRN

## 2020-05-02 NOTE — H&P (Addendum)
History and Physical    William [name] FFM:[id number] DOB: 10-21-1950 DOA: 05/02/2020  PCP: Horald Pollen, MD   Patient coming from: Home  I have personally briefly reviewed patient's old medical records in [place]  Chief Complaint: Altered mental status  HPI: William [name] is a 69 y.o. male with medical history significant of Goodpasture's disease, end-stage renal disease on hemodialysis, cirrhosis of liver, chronic anemia, thrombocytopenia, hypertension, labs mellitus type II, recent admission to [facility] ICU on 04/16/2020 for GI bleeding for which he underwent upper GI endoscopy which showed 1 large ulcerated gastric varix/portal hypertensive gastropathy/grade 1 esophageal varices/gastric varices for which patient was transferred to [facility] where he underwent TIPS/CARTO (transvenous obliteration and coagulation of gastric varices and gastrojejunal shunt) procedure on 04/20/2020 and was subsequently discharged on 04/27/2020 presented with increasing confusion for 1 day.  Patient is awake but slightly confused and does not speak [place].  Son is at bedside who translates for the patient.  As per the son, patient has been progressively getting more confused for the last 1 to 2 days with intermittent nausea, with several episodes of vomiting and diarrhea for the last 2 days.  Patient went for dialysis today but was able to complete only half of the session and he was transported to the ED by EMS because of confusion and patient would not remain still during dialysis.  Patient apparently denies abdominal pain.  No fever reported.  No worsening cough, chest pain, loss of consciousness, seizures, new rash.  ED Course: Ammonia was 67.  Lactulose was ordered.  CT of the head without contrast was negative for acute abnormality.  Chest x-ray showed vascular congestion likely related to fluid overload. Hospitalist service was called to evaluate the patient.  Review of Systems: Could not  be appropriately obtained because of patient's confusional state.  Past Medical History:  Diagnosis Date  . Anemia   . Blind one eye   . CHF (congestive heart failure) ([place])   . Cirrhosis ([place])   . Cirrhosis ([place])   . CKD (chronic kidney disease), stage IV ([place])   . Dialysis patient ([place])   . Goodpasture's disease [facility])    on outpatient plasmapheresis/notes 11/20/2017  . Grade I diastolic dysfunction 57/08/7791  . History of anemia due to chronic kidney disease   . History of blood transfusion X 1   "UGIB; low blood count"  . History of plasmapheresis    "qod" (11/20/2017)  . Hypertension   . Pancytopenia ([place]) 08/20/2017  . Type II diabetes mellitus ([place])     Past Surgical History:  Procedure Laterality Date  . AV FISTULA PLACEMENT Left 12/26/2017   Procedure: LEFT BRACHIOCEPHALIC ARTERIOVENOUS (AV) FISTULA CREATION;  Surgeon: Conrad Elbert, MD;  Location: [place];  Service: Vascular;  Laterality: Left;  . BASCILIC VEIN TRANSPOSITION Left 02/16/2018   Procedure: BRACHIOBASILIC VEIN TRANSPOSITION SECOND STAGE;  Surgeon: Conrad Carrollton, MD;  Location: [place];  Service: Vascular;  Laterality: Left;  . CATARACT EXTRACTION W/ INTRAOCULAR LENS  IMPLANT, BILATERAL Bilateral   . ESOPHAGEAL BANDING N/A 04/01/2018   Procedure: ESOPHAGEAL BANDING;  Surgeon: Otis Brace, MD;  [facility];  Service: Gastroenterology;  Laterality: N/A;  . ESOPHAGEAL BANDING N/A 06/29/2018   Procedure: ESOPHAGEAL BANDING;  Surgeon: Otis Brace, MD;  [facility];  Service: Gastroenterology;  Laterality: N/A;  . ESOPHAGOGASTRODUODENOSCOPY (EGD) WITH PROPOFOL N/A 04/01/2018   Procedure: ESOPHAGOGASTRODUODENOSCOPY (EGD) WITH PROPOFOL;  Surgeon: Otis Brace, MD;  [facility];  Service: Gastroenterology;  Laterality: N/A;  . ESOPHAGOGASTRODUODENOSCOPY (EGD) WITH PROPOFOL N/A 06/29/2018   Procedure: ESOPHAGOGASTRODUODENOSCOPY (EGD) WITH PROPOFOL;  Surgeon: Otis Brace,  MD;  [facility];  Service: Gastroenterology;  Laterality: N/A;  . ESOPHAGOGASTRODUODENOSCOPY (EGD) WITH PROPOFOL N/A 09/30/2018   Procedure: ESOPHAGOGASTRODUODENOSCOPY (EGD) WITH PROPOFOL;  Surgeon: Otis Brace, MD;  [facility];  Service: Gastroenterology;  Laterality: N/A;  . ESOPHAGOGASTRODUODENOSCOPY (EGD) WITH PROPOFOL N/A 04/20/2019   Procedure: ESOPHAGOGASTRODUODENOSCOPY (EGD) WITH PROPOFOL;  Surgeon: Otis Brace, MD;  [facility];  Service: Gastroenterology;  Laterality: N/A;  . ESOPHAGOGASTRODUODENOSCOPY (EGD) WITH PROPOFOL N/A 04/17/2020   Procedure: ESOPHAGOGASTRODUODENOSCOPY (EGD) WITH PROPOFOL;  Surgeon: Wonda Horner, MD;  Location: [place];  Service: Gastroenterology;  Laterality: N/A;  . I & D EXTREMITY Left 02/18/2018   Procedure: IRRIGATION AND DEBRIDEMENT LEFT ARM;  Surgeon: Serafina Mitchell, MD;  [facility];  Service: Vascular;  Laterality: Left;  . INSERTION OF DIALYSIS CATHETER N/A 01/19/2018   Procedure: INSERTION OF DIALYSIS CATHETER - RIGHT INTERNAL JUGULAR PLACEMENT;  Surgeon: Rosetta Posner, MD;  Location: [place];  Service: Vascular;  Laterality: N/A;  . IR FLUORO GUIDE CV LINE RIGHT  11/10/2017  . IR PARACENTESIS  12/10/2017  . IR PARACENTESIS  12/26/2017  . IR REMOVAL TUN CV CATH W/O FL  11/28/2017  . IR THROMBECTOMY AV FISTULA W/THROMBOLYSIS/PTA INC/SHUNT/IMG LEFT Left 06/02/2019  . IR US GUIDE VASC ACCESS LEFT  06/02/2019  . IR US GUIDE VASC ACCESS RIGHT  11/10/2017  . NECK SURGERY  2007   "back of neck; no hardware in there"   Social history: As per prior records  reports that he has never smoked. He has never used smokeless tobacco. He reports that he does not drink alcohol and does not use drugs.  -Currently lives with son at home  Allergies  Allergen Reactions  . Heparin Other (See Comments)    Patient is Muslim and is not permitted Pork derative due to religious belief)  . Pork-Derived Products Other (See  Comments)    Patient does not eat pork due to religious beliefs    Family History  Problem Relation Age of Onset  . Diabetes Mellitus II Sister   . Diabetes Sister   . Stroke Brother   . Heart attack Brother     Prior to Admission medications   Medication Sig Start Date End Date Taking? Authorizing Provider  amLODipine (NORVASC) 10 MG tablet Take 5 mg by mouth daily.    [provider]  calcium acetate (PHOSLO) 667 MG capsule Take 2 capsules (1,334 mg total) by mouth 3 (three) times daily with meals. Patient taking differently: Take 667 mg by mouth 3 (three) times daily with meals.  01/26/18   Purohit, Konrad Dolores, MD  carvedilol (COREG) 12.5 MG tablet Take 25 mg by mouth 2 (two) times daily. 03/29/20   [provider]  cetirizine (ZYRTEC) 5 MG tablet Take 1 tablet (5 mg total) by mouth 3 (three) times a week. Before dialysis 01/07/20   Rutherford Guys, MD  doxycycline (VIBRA-TABS) 100 MG tablet Take 1 tablet (100 mg total) by mouth 2 (two) times daily. 04/03/20   Trula Slade, DPM  glucose blood (ONETOUCH VERIO) test strip USE 1 STRIP TO CHECK GLUCOSE THREE TIMES DAILY. DX:E11.65 01/31/20   Elayne Snare, MD  insulin degludec (TRESIBA FLEXTOUCH) 100 UNIT/ML SOPN FlexTouch Pen Inject 0.14 mLs (14 Units total) into the skin daily. Inject 14 units under the skin once daily.  Patient taking differently: Inject 12 Units into the skin daily.  03/02/19   Elayne Snare, MD  Insulin Pen Needle (ADVOCATE INSULIN PEN NEEDLES) 31G X 5 MM MISC Use four times daily to inject insulin 04/27/18   Elayne Snare, MD  insulin regular (NOVOLIN R,HUMULIN R) 100 units/mL injection Inject 8-10 Units into the skin See admin instructions. INJECT 8 UNITS UNDER THE SKIN AT BREAKFAST, 10 UNITS AT LUNCH AND 8 UNITS AT DINNER.    [provider]  lidocaine-prilocaine (EMLA) cream Apply 1 application topically Every Tuesday,Thursday,and Saturday with dialysis. 08/24/18   [provider]  loperamide  (IMODIUM) 2 MG capsule Take 2 capsules (4 mg total) by mouth as needed for diarrhea or loose stools. 05/01/20   [name], Hallie C, PA-C  Multiple Vitamins-Minerals (MULTIVITAMIN WITH MINERALS) tablet Take 1 tablet by mouth daily.    [provider]  mupirocin ointment (BACTROBAN) 2 % Apply 1 application topically 2 (two) times daily. 04/03/20   Trula Slade, DPM  ondansetron (ZOFRAN ODT) 4 MG disintegrating tablet Take 1 tablet (4 mg total) by mouth 2 (two) times daily as needed for nausea or vomiting. 05/01/20   [name], Madelynn Done C, PA-C  ONE TOUCH LANCETS MISC Use to test blood sugar three times daily 04/27/18   Elayne Snare, MD  pantoprazole (PROTONIX) 40 MG injection Inject 40 mg into the vein every 12 (twelve) hours for 1 day. 04/19/20 04/20/20  Bowser, Laurel Dimmer, NP  Polyethyl Glycol-Propyl Glycol (SYSTANE) 0.4-0.3 % SOLN Place 1 drop into both eyes 3 (three) times daily as needed (for dry eyes).    [provider]  rosuvastatin (CRESTOR) 5 MG tablet Take 1 tablet by mouth once daily 04/25/20   Elayne Snare, MD    Physical Exam: Vitals:   05/02/20 1140  BP: (!) 158/113  Pulse: 88  Resp: 15  Temp: 97.9 F (36.6 C)  TempSrc: Oral  SpO2: 100%    Constitutional: No acute distress.  Chronically ill looking gentleman lying on bed.  Awake, answers some questions with the son.   Vitals:   05/02/20 1140  BP: (!) 158/113  Pulse: 88  Resp: 15  Temp: 97.9 F (36.6 C)  TempSrc: Oral  SpO2: 100%   Eyes: PERRL, lids and conjunctivae normal ENMT: Mucous membranes are moist. Posterior pharynx clear of any exudate or lesions. Neck: normal, supple, no masses, no thyromegaly.  No neck stiffness or rigidity Respiratory: bilateral decreased breath sounds at bases, no wheezing; scattered crackles heard. Normal respiratory effort. No accessory muscle use.  Cardiovascular: S1 S2 positive, rate controlled. No extremity edema. 2+ pedal pulses.  Abdomen: no tenderness, no masses palpated.  No hepatosplenomegaly. Bowel sounds positive.  Musculoskeletal: no clubbing / cyanosis. No joint deformity upper and lower extremities.  Skin: no rashes, lesions, ulcers. No induration Neurologic: CN 2-12 grossly intact. Moving extremities. No focal neurologic deficits.  Alert, slightly confused.  Does not follow much commands.  Intermittently answers some questions asked by the son. Psychiatric: Could not be properly assessed because of mental status.   Labs on Admission: I have personally reviewed following labs and imaging studies  CBC: Recent Labs  Lab 05/01/20 1834 05/02/20 1208  WBC 3.8* 4.6  HGB 9.7* 9.8*  HCT 31.0* 30.0*  MCV 84.7 83.8  PLT 77* 57*   Basic Metabolic Panel: Recent Labs  Lab 05/01/20 1834 05/02/20 1208  NA 137 136  K 4.2 4.4  CL 102 100  CO2 23 22  GLUCOSE 281* 205*  BUN 31* 17  CREATININE 6.38* 3.90*  CALCIUM 8.9 9.3   GFR: CrCl cannot be calculated (Unknown ideal weight.). Liver Function Tests: Recent Labs  Lab 05/02/20 1208  AST 34  ALT 23  ALKPHOS 100  BILITOT 2.0*  PROT 6.3*  ALBUMIN 2.4*   No results for input(s): LIPASE, AMYLASE in the last 168 hours. Recent Labs  Lab 05/02/20 1208  AMMONIA 67*   Coagulation Profile: Recent Labs  Lab 05/02/20 1208  INR 1.2   Cardiac Enzymes: No results for input(s): CKTOTAL, CKMB, CKMBINDEX, TROPONINI in the last 168 hours. BNP (last 3 results) No results for input(s): PROBNP in the last 8760 hours. HbA1C: No results for input(s): HGBA1C in the last 72 hours. CBG: Recent Labs  Lab 05/01/20 1902  GLUCAP 261*   Lipid Profile: No results for input(s): CHOL, HDL, LDLCALC, TRIG, CHOLHDL, LDLDIRECT in the last 72 hours. Thyroid Function Tests: No results for input(s): TSH, T4TOTAL, FREET4, T3FREE, THYROIDAB in the last 72 hours. Anemia Panel: No results for input(s): VITAMINB12, FOLATE, FERRITIN, TIBC, IRON, RETICCTPCT in the last 72 hours. Urine analysis:    Component Value  Date/Time   COLORURINE YELLOW 04/18/2020 0118   APPEARANCEUR HAZY (A) 04/18/2020 0118   LABSPEC 1.027 04/18/2020 0118   PHURINE 5.0 04/18/2020 0118   GLUCOSEU NEGATIVE 04/18/2020 0118   HGBUR NEGATIVE 04/18/2020 0118   BILIRUBINUR NEGATIVE 04/18/2020 0118   BILIRUBINUR neg 09/14/2015 1441   KETONESUR NEGATIVE 04/18/2020 0118   PROTEINUR 30 (A) 04/18/2020 0118   UROBILINOGEN 0.2 09/14/2015 1441   UROBILINOGEN 0.2 01/27/2013 2052   NITRITE NEGATIVE 04/18/2020 0118   LEUKOCYTESUR NEGATIVE 04/18/2020 0118    Radiological Exams on Admission: DG Chest 1 View  Result Date: 05/02/2020 CLINICAL DATA:  Cough and altered mental status EXAM: CHEST  1 VIEW COMPARISON:  04/19/2020 FINDINGS: Cardiac shadow is enlarged. Aortic calcifications are again noted. Vascular congestion is seen although improved from the prior exam likely related to a degree of volume overload. No focal confluent infiltrate is seen. No bony abnormality is noted. IMPRESSION: Vascular congestion likely related to fluid overload. Electronically Signed   By: Inez Catalina M.D.   On: 05/02/2020 13:38   CT HEAD WO CONTRAST  Result Date: 05/02/2020 CLINICAL DATA:  Mental status change EXAM: CT HEAD WITHOUT CONTRAST TECHNIQUE: Contiguous axial images were obtained from the base of the skull through the vertex without intravenous contrast. COMPARISON:  None. FINDINGS: Brain: No evidence of acute large vascular territory infarction, hemorrhage, hydrocephalus, extra-axial collection or mass lesion/mass effect. Mild patchy white matter hypoattenuation, likely the sequela of chronic microvascular ischemic disease. Mild cerebral atrophy with ex vacuo ventricular dilation. Vascular: No hyperdense vessel identified. Calcific intracranial atherosclerosis. Skull: Normal. Negative for fracture or focal lesion. Sinuses/Orbits: No acute finding. Other: No mastoid effusions. IMPRESSION: No evidence of acute intracranial abnormality. Electronically Signed    By: Margaretha Sheffield MD   On: 05/02/2020 16:21     Assessment/Plan  Probable hepatic encephalopathy causing confusion/altered mental status History of decompensated cirrhosis of liver with varices with recent GI bleeding requiring TIPS/CARTO on 04/20/20 at [facility] -Patient presented with worsening confusion for the last 1 to 2 days along with nausea, vomiting and diarrhea. -Ammonia 67 -Patient is not on lactulose at home.  Will start lactulose.  Patient apparently split out the lactulose ordered in the ED.  I have asked the son to encourage patient to swallow the lactulose otherwise patient will need to be put on rectal lactulose. -Monitor mental  status.  Fall precautions.  CT of the head was negative for acute abnormality. -PT eval in AM.  End-stage renal disease on hemodialysis History of Goodpasture syndrome -Patient could not complete his regular dialysis session today.  I have informed Dr. Shelda Altes regarding the patient.  Dialysis as per nephrology schedule  Anemia of chronic disease -From renal failure and cirrhosis of liver.  Hemoglobin stable.  Chronic thrombocytopenia -From cirrhosis of liver.  Monitor platelets.  No signs of bleeding.  Diabetes mellitus type 2 with hyperglycemia -Continue home long-acting insulin but half the dose.  Continue CBGs with SSI.  Carb modified diet.  Hypertension -Monitor blood pressure.  Resume home regimen.  Chronic diastolic heart failure -Strict input and output.  Daily weights.  Fluid restriction.  Volume managed by dialysis.  Generalized conditioning -PT eval in a.m. -Palliative care consultation for goals of care discussion   DVT prophylaxis: SCDs.  No heparin/Lovenox because of thrombocytopenia Code Status: Full Family Communication: Son at bedside Disposition Plan: Home in 1 to 2 days if clinically improves Consults called: Nephrology Admission status: Observation/telemetry  Severity of Illness: The appropriate patient  status for this patient is OBSERVATION. Observation status is judged to be reasonable and necessary in order to provide the required intensity of service to ensure the patient's safety. The patient's presenting symptoms, physical exam findings, and initial radiographic and laboratory data in the context of their medical condition is felt to place them at decreased risk for further clinical deterioration. Furthermore, it is anticipated that the patient will be medically stable for discharge from the hospital within 2 midnights of admission. The following factors support the patient status of observation.   " The patient's presenting symptoms include altered mental status/nausea/vomiting/diarrhea " The physical exam findings include altered mental status. " The initial radiographic and laboratory data are elevated ammonia level.      Aline August MD [facility]  05/02/2020, 4:26 PM

## 2020-05-02 NOTE — Consult Note (Signed)
Consultation Note Date: 05/02/2020   Patient Name: William [name]  DOB: 1951/02/25  MRN: [id number]  Age / Sex: 69 y.o., male  PCP: Horald Pollen, MD Referring Physician: Aline August, MD  Reason for Consultation: Establishing goals of care  HPI/Patient Profile: 69 y.o. male  with past medical history of Goodpasture's disease, ESRD on dialysis, cirrhosis, chronic anemia, pancytopenia, hypertension, and diabetes mellitus type II. He was recently hospitalized at [facility] 9/12-9/15 with GI bleeding, underwent EGD was notable for large ulcerated gastric varices and portal hypertensive gastropathy. He was transferred to [facility] and underwent TIPS/CARTO procedures on 9/16.  Patient presented to [facility] emergency department from the dialysis center on 05/02/2020 with AMS.  ED course: Ammonia 67. Lactulose ordered. CT head negative for acute abnormality. Chest x-ray showed vascular congestion likely related to fluids overload.   Patient is currently actively listed at [facility] for liver and kidney transplant with MELD 27 (9/21).  Primary decision maker: Johnmichael Melhorn (son) [phone]  Clinical Assessment and Goals of Care: I have reviewed medical records including EPIC notes, labs and imaging, examined the patient and met at bedside with son/[place] to discuss diagnosis, prognosis, GOC, EOL wishes, disposition, and options. Patient is sleeping throughout our discussion. [name] states he has been very confused.   I introduced Palliative Medicine as specialized medical care for people living with serious illness. It focuses on providing relief from the symptoms and stress of a serious illness.   We discussed a brief life review of the patient. He is originally from [place], and worked as a Psychologist, sport and exercise. He and his wife started came to the [place]. in 2011, but would often travel back to [place]. They have 3 children; [name] has  lived in [place] since 2001, and the other 2 children live in [place]. Patient practices the the Muslim faith.  Social history: patient and his wife live in [place] with [place], his wife, and their 5 children. Patient has been on dialysis since 2019 and overall has done well with it per [facility]. As far as functional status, he is ambulatory ad independent with ADLs. Albumin is 2.4, indicating some degree of malnutrition.  We discussed his current illness and what it means in the larger context of his ongoing co-morbidities.  Natural disease trajectory of kidney and liver disease was discussed.  I attempted to elicit values and goals of care important to the patient. [name] is hopeful for his father to receive the transplant and be able to come off dialysis.    The difference between aggressive medical intervention and comfort care was considered in light of the patient's goals of care. The patient is interested in all aggressive medical interventions offered.    Advanced directives, concepts specific to code status, artifical feeding and hydration, and rehospitalization were considered and discussed. MOST form was completed.  Hospice and Palliative Care services outpatient were explained and offered.  Questions and concerns were addressed.  The family was encouraged to call with questions or concerns.    SUMMARY OF RECOMMENDATIONS   -  full code, full scope treatment - patient/family is hopeful to receive a liver and kidney transplant - MOST form completed and will be scanned into Epic   I completed a MOST form today. The patient and family outlined their wishes for the following treatment decisions:  Cardiopulmonary Resuscitation: Attempt Resuscitation (CPR)  Medical Interventions: Full Scope of Treatment: Use intubation, advanced airway interventions, mechanical ventilation, cardioversion as indicated, medical treatment, IV fluids, etc, also provide comfort measures. Transfer to the hospital  if indicated  Antibiotics: Antibiotics if indicated  IV Fluids: IV fluids if indicated  Feeding Tube: Feeding tube for a defined trial period    Code Status/Advance Care Planning:  Full code  Palliative Prophylaxis:   Aspiration and Turn Reposition  Additional Recommendations (Limitations, Scope, Preferences):  Full Scope Treatment  Psycho-social/Spiritual:   Created space and opportunity for family to express thoughts and feelings regarding patient's current medical situation.   Emotional support provided   Prognosis:   Unable to determine  Discharge Planning: To Be Determined      Primary Diagnoses: Present on Admission: . Hepatic encephalopathy ([place]) . Cirrhosis of liver with ascites ([place]) . Goodpasture's syndrome ([place]) with associated hemoptysis . Thrombocytopenia ([place]) . Essential hypertension . Type II diabetes mellitus with renal manifestations ([place])   I have reviewed the medical record, interviewed the patient and family, and examined the patient. The following aspects are pertinent.  Past Medical History:  Diagnosis Date  . Anemia   . Blind one eye   . CHF (congestive heart failure) ([place])   . Cirrhosis ([place])   . Cirrhosis ([place])   . CKD (chronic kidney disease), stage IV ([place])   . Dialysis patient ([place])   . Goodpasture's disease [facility])    on outpatient plasmapheresis/notes 11/20/2017  . Grade I diastolic dysfunction 97/41/6384  . History of anemia due to chronic kidney disease   . History of blood transfusion X 1   "UGIB; low blood count"  . History of plasmapheresis    "qod" (11/20/2017)  . Hypertension   . Pancytopenia ([place]) 08/20/2017  . Type II diabetes mellitus ([place])      Scheduled Meds: . [START ON 05/03/2020] amLODipine  5 mg Oral Daily  . calcium acetate  667 mg Oral TID WC  . carvedilol  25 mg Oral BID  . insulin aspart  0-5 Units Subcutaneous QHS  . insulin aspart  0-6 Units Subcutaneous TID WC  . insulin glargine  7 Units  Subcutaneous QHS  . lactulose  20 g Oral Once  . lactulose  30 g Oral TID  . pantoprazole (PROTONIX) IV  40 mg Intravenous Q24H  . [START ON 05/03/2020] rosuvastatin  5 mg Oral QHS   Continuous Infusions: PRN Meds:.acetaminophen **OR** acetaminophen, ondansetron **OR** ondansetron (ZOFRAN) IV, polyvinyl alcohol  Allergies  Allergen Reactions  . Heparin Other (See Comments)    Patient is Muslim and is not permitted Pork derative due to religious belief)  . Pork-Derived Products Other (See Comments)    Patient does not eat pork due to religious beliefs   Review of Systems  Unable to perform ROS: Mental status change  (patient sleeping and has been confused)  Physical Exam Vitals reviewed.  Constitutional:      General: He is sleeping. He is not in acute distress.    Comments: Chronically ill-appearing  HENT:     Head: Normocephalic and atraumatic.  Pulmonary:     Effort: Pulmonary effort is normal.     Vital Signs: BP Marland Kitchen)  155/54 (BP Location: Right Arm)   Pulse 82   Temp 97.9 F (36.6 C) (Oral)   Resp 14   SpO2 100%  Pain Scale: 0-10   Pain Score: 0-No pain   SpO2: SpO2: 100 % O2 Device:SpO2: 100 % O2 Flow Rate: .   IO: Intake/output summary: No intake or output data in the 24 hours ending 05/02/20 1855       Palliative Assessment/Data: 60%     Time In:18:55 Time Out: 19:50 Time Total: 55 minutes Greater than 50%  of this time was spent counseling and coordinating care related to the above assessment and plan.  Signed by: Lavena Bullion, NP   Please contact Palliative Medicine Team phone at [phone] for questions and concerns.  For individual provider: See Shea Evans

## 2020-05-02 NOTE — ED Triage Notes (Signed)
Pt from dialysis for AMS. Pt received HALF of his tx today. Unable to complete tx d/t being curled up in a ball and flailing his arms around. Son reports hypotension over the last few days. Recent admission for GI bleed. Pt nonverbal with EMS and at dialysis, although normally is A/O x 4. Pt has cough, but family reports it is normal for the patient.

## 2020-05-02 NOTE — ED Provider Notes (Signed)
[facility] Provider Note   CSN: [id number] Arrival date & time: 05/02/20  1122     History Chief Complaint  Patient presents with  . Altered Mental Status    Christipher Criswell is a 69 y.o. male.  Patient with hx Goodpastures disease, cirrhosis, ESRD/HD,  s/p TIPS/CARTO procedure at [facility] 9/16, presents with recent nausea, vomiting, diarrhea, for the past 2 days, and increased confusion for 1 day. Symptoms acute onset, moderate, persistent, worse today. Family tried to take to dialysis today, they were able to complete ~ 1/2 of the session, and transported to ED via EMS as pt confused, and would not remain still during dialysis. Several episodes of diarrhea and vomiting in past two days. Diarrhea not bloody, no melena. Emesis not bloody or bilious. No abd pain. No fever. +recent antibiotic tx. No other specific known ill contacts or bad food ingestion.  Pt confused/altered - level 5 caveat.   The history is provided by the patient, the EMS personnel, a relative and medical records. The history is limited by the condition of the patient.       Past Medical History:  Diagnosis Date  . Anemia   . Blind one eye   . CHF (congestive heart failure) ([place])   . Cirrhosis ([place])   . Cirrhosis ([place])   . CKD (chronic kidney disease), stage IV ([place])   . Dialysis patient ([place])   . Goodpasture's disease [facility])    on outpatient plasmapheresis/notes 11/20/2017  . Grade I diastolic dysfunction 84/69/6295  . History of anemia due to chronic kidney disease   . History of blood transfusion X 1   "UGIB; low blood count"  . History of plasmapheresis    "qod" (11/20/2017)  . Hypertension   . Pancytopenia ([place]) 08/20/2017  . Type II diabetes mellitus [facility])     Patient Active Problem List   Diagnosis Date Noted  . GIB (gastrointestinal bleeding) 04/16/2020  . Hyperkalemia, diminished renal excretion 06/01/2019  . AV fistula thrombosis, initial encounter ([place])  06/01/2019  . AV fistula occlusion, initial encounter ([place])   . Chronic systolic CHF (congestive heart failure) ([place])   . Encounter for other preprocedural examination 09/23/2018  . Esophageal varices ([place]) 09/23/2018  . Thrombocytopenia ([place]) 09/23/2018  . CHF (congestive heart failure) ([place]) 09/22/2018  . ESRD on dialysis ([place]) 02/06/2018  . CKD (chronic kidney disease) stage 4, GFR 15-29 ml/min (H[facility]) 12/19/2017  . Cirrhosis of liver with ascites ([place]) 12/09/2017  . Goodpasture's syndrome ([place]) with associated hemoptysis 11/20/2017  . Acute on chronic diastolic CHF (congestive heart failure) ([place]) 11/06/2017  . P[place]enia ([place] 11/06/2017  . GERD (gastroesophageal reflux disease) 11/06/2017  . Essential hypertension 11/06/2017  . Type II diabetes mellitus with renal manifestations ([place]) 08/25/2015    Past Surgical History:  Procedure Laterality Date  . AV FISTULA PLACEMENT Left 12/26/2017   Procedure: LEFT BRACHIOCEPHALIC ARTERIOVENOUS (AV) FISTULA CREATION;  Surgeon: Conrad Portal, MD;  Location: [place];  Service: Vascular;  Laterality: Left;  . BASCILIC VEIN TRANSPOSITION Left 02/16/2018   Procedure: BRACHIOBASILIC VEIN TRANSPOSITION SECOND STAGE;  Surgeon: Conrad Sherwood, MD;  Location: [place];  Service: Vascular;  Laterality: Left;  . CATARACT EXTRACTION W/ INTRAOCULAR LENS  IMPLANT, BILATERAL Bilateral   . ESOPHAGEAL BANDING N/A 04/01/2018   Procedure: ESOPHAGEAL BANDING;  Surgeon: Otis Brace, MD;  Location: [facility];  Service: Gastroenterology;  Laterality: N/A;  . ESOPHAGEAL BANDING N/A 06/29/2018   Procedure: ESOPHAGEAL BANDING;  Surgeon: Alessandra Bevels,  Orson Gear, MD;  Location: [facility];  Service: Gastroenterology;  Laterality: N/A;  . ESOPHAGOGASTRODUODENOSCOPY (EGD) WITH PROPOFOL N/A 04/01/2018   Procedure: ESOPHAGOGASTRODUODENOSCOPY (EGD) WITH PROPOFOL;  Surgeon: Otis Brace, MD;  [facility]: [facility];  Service: Gastroenterology;  Laterality: N/A;  .  ESOPHAGOGASTRODUODENOSCOPY (EGD) WITH PROPOFOL N/A 06/29/2018   Procedure: ESOPHAGOGASTRODUODENOSCOPY (EGD) WITH PROPOFOL;  Surgeon: Otis Brace, MD;  [facility]: [facility];  Service: Gastroenterology;  Laterality: N/A;  . ESOPHAGOGASTRODUODENOSCOPY (EGD) WITH PROPOFOL N/A 09/30/2018   Procedure: ESOPHAGOGASTRODUODENOSCOPY (EGD) WITH PROPOFOL;  Surgeon: Otis Brace, MD;  [facility];  Service: Gastroenterology;  Laterality: N/A;  . ESOPHAGOGASTRODUODENOSCOPY (EGD) WITH PROPOFOL N/A 04/20/2019   Procedure: ESOPHAGOGASTRODUODENOSCOPY (EGD) WITH PROPOFOL;  Surgeon: Otis Brace, MD;  [facility];  Service: Gastroenterology;  Laterality: N/A;  . ESOPHAGOGASTRODUODENOSCOPY (EGD) WITH PROPOFOL N/A 04/17/2020   Procedure: ESOPHAGOGASTRODUODENOSCOPY (EGD) WITH PROPOFOL;  Surgeon: Wonda Horner, MD;  Location: [place];  Service: Gastroenterology;  Laterality: N/A;  . I & D EXTREMITY Left 02/18/2018   Procedure: IRRIGATION AND DEBRIDEMENT LEFT ARM;  Surgeon: Serafina Mitchell, MD;  Location: [facility];  Service: Vascular;  Laterality: Left;  . INSERTION OF DIALYSIS CATHETER N/A 01/19/2018   Procedure: INSERTION OF DIALYSIS CATHETER - RIGHT INTERNAL JUGULAR PLACEMENT;  Surgeon: Rosetta Posner, MD;  Location: [place];  Service: Vascular;  Laterality: N/A;  . IR FLUORO GUIDE CV LINE RIGHT  11/10/2017  . IR PARACENTESIS  12/10/2017  . IR PARACENTESIS  12/26/2017  . IR REMOVAL TUN CV CATH W/O FL  11/28/2017  . IR THROMBECTOMY AV FISTULA W/THROMBOLYSIS/PTA INC/SHUNT/IMG LEFT Left 06/02/2019  . IR US GUIDE VASC ACCESS LEFT  06/02/2019  . IR US GUIDE VASC ACCESS RIGHT  11/10/2017  . NECK SURGERY  2007   "back of neck; no hardware in there"       Family History  Problem Relation Age of Onset  . Diabetes Mellitus II Sister   . Diabetes Sister   . Stroke Brother   . Heart attack Brother     Social History   Tobacco Use  . Smoking status: Never Smoker  . Smokeless tobacco:  Never Used  Vaping Use  . Vaping Use: Never used  Substance Use Topics  . Alcohol use: Never  . Drug use: Never    Home Medications Prior to Admission medications   Medication Sig Start Date End Date Taking? Authorizing Provider  amLODipine (NORVASC) 10 MG tablet Take 5 mg by mouth daily.    [provider]  calcium acetate (PHOSLO) 667 MG capsule Take 2 capsules (1,334 mg total) by mouth 3 (three) times daily with meals. Patient taking differently: Take 667 mg by mouth 3 (three) times daily with meals.  01/26/18   Purohit, Konrad Dolores, MD  carvedilol (COREG) 12.5 MG tablet Take 25 mg by mouth 2 (two) times daily. 03/29/20   [provider]  cetirizine (ZYRTEC) 5 MG tablet Take 1 tablet (5 mg total) by mouth 3 (three) times a week. Before dialysis 01/07/20   Rutherford Guys, MD  doxycycline (VIBRA-TABS) 100 MG tablet Take 1 tablet (100 mg total) by mouth 2 (two) times daily. 04/03/20   Trula Slade, DPM  glucose blood (ONETOUCH VERIO) test strip USE 1 STRIP TO CHECK GLUCOSE THREE TIMES DAILY. DX:E11.65 01/31/20   Elayne Snare, MD  insulin degludec (TRESIBA FLEXTOUCH) 100 UNIT/ML SOPN FlexTouch Pen Inject 0.14 mLs (14 Units total) into the skin daily. Inject 14 units under the skin once daily. Patient  taking differently: Inject 12 Units into the skin daily.  03/02/19   Elayne Snare, MD  Insulin Pen Needle (ADVOCATE INSULIN PEN NEEDLES) 31G X 5 MM MISC Use four times daily to inject insulin 04/27/18   Elayne Snare, MD  insulin regular (NOVOLIN R,HUMULIN R) 100 units/mL injection Inject 8-10 Units into the skin See admin instructions. INJECT 8 UNITS UNDER THE SKIN AT BREAKFAST, 10 UNITS AT LUNCH AND 8 UNITS AT DINNER.    [provider]  lidocaine-prilocaine (EMLA) cream Apply 1 application topically Every Tuesday,Thursday,and Saturday with dialysis. 08/24/18   [provider]  loperamide (IMODIUM) 2 MG capsule Take 2 capsules (4 mg total) by mouth as needed for  diarrhea or loose stools. 05/01/20   [name], Hallie C, PA-C  Multiple Vitamins-Minerals (MULTIVITAMIN WITH MINERALS) tablet Take 1 tablet by mouth daily.    [provider]  mupirocin ointment (BACTROBAN) 2 % Apply 1 application topically 2 (two) times daily. 04/03/20   Trula Slade, DPM  ondansetron (ZOFRAN ODT) 4 MG disintegrating tablet Take 1 tablet (4 mg total) by mouth 2 (two) times daily as needed for nausea or vomiting. 05/01/20   [name], Madelynn Done C, PA-C  ONE TOUCH LANCETS MISC Use to test blood sugar three times daily 04/27/18   Elayne Snare, MD  pantoprazole (PROTONIX) 40 MG injection Inject 40 mg into the vein every 12 (twelve) hours for 1 day. 04/19/20 04/20/20  Bowser, Laurel Dimmer, NP  Polyethyl Glycol-Propyl Glycol (SYSTANE) 0.4-0.3 % SOLN Place 1 drop into both eyes 3 (three) times daily as needed (for dry eyes).    [provider]  rosuvastatin (CRESTOR) 5 MG tablet Take 1 tablet by mouth once daily 04/25/20   Elayne Snare, MD    Allergies    Heparin and Pork-derived products  Review of Systems   Review of Systems  Unable to perform ROS: Mental status change  Constitutional: Negative for fever.  Respiratory:       Occasional non prod cough ('for a long time, months, per family').  pt altered/confused, unable to complete ros.    Physical Exam Updated Vital Signs BP (!) 158/113   Pulse 88   Temp 97.9 F (36.6 C) (Oral)   Resp 15   SpO2 100%   Physical Exam Vitals and nursing note reviewed.  Constitutional:      Appearance: Normal appearance. He is well-developed.  HENT:     Head: Atraumatic.     Nose: Nose normal.     Mouth/Throat:     Mouth: Mucous membranes are moist.     Pharynx: Oropharynx is clear.  Eyes:     General: No scleral icterus.    Conjunctiva/sclera: Conjunctivae normal.     Pupils: Pupils are equal, round, and reactive to light.  Neck:     Vascular: No carotid bruit.     Trachea: No tracheal deviation.     Comments: No  stiffness or rigidity.  Cardiovascular:     Rate and Rhythm: Normal rate and regular rhythm.     Pulses: Normal pulses.     Heart sounds: Normal heart sounds. No murmur heard.  No friction rub. No gallop.   Pulmonary:     Effort: Pulmonary effort is normal. No accessory muscle usage or respiratory distress.     Breath sounds: Normal breath sounds.  Abdominal:     General: Bowel sounds are normal. There is no distension.     Palpations: Abdomen is soft.     Tenderness: There is  no abdominal tenderness. There is no guarding.  Genitourinary:    Comments: No cva tenderness. Musculoskeletal:        General: No swelling or tenderness.     Cervical back: Normal range of motion and neck supple. No rigidity.     Comments: Dialysis clamps in place LUE.  Skin:    General: Skin is warm and dry.     Findings: No rash.  Neurological:     Mental Status: He is alert.     Comments: Alert, however confused, mild agitated. Moves bilateral extremities purposefully with good strength. Not following commands.   Psychiatric:     Comments: Resting comfortably, easily agitated.       ED Results / Procedures / Treatments   Labs (all labs ordered are listed, but only abnormal results are displayed) Results for orders placed or performed during the hospital encounter of 05/02/20  Comprehensive metabolic panel  Result Value Ref Range   Sodium 136 135 - 145 mmol/L   Potassium 4.4 3.5 - 5.1 mmol/L   Chloride 100 98 - 111 mmol/L   CO2 22 22 - 32 mmol/L   Glucose, Bld 205 (H) 70 - 99 mg/dL   BUN 17 8 - 23 mg/dL   Creatinine, Ser 3.90 (H) 0.61 - 1.24 mg/dL   Calcium 9.3 8.9 - 10.3 mg/dL   Total Protein 6.3 (L) 6.5 - 8.1 g/dL   Albumin 2.4 (L) 3.5 - 5.0 g/dL   AST 34 15 - 41 U/L   ALT 23 0 - 44 U/L   Alkaline Phosphatase 100 38 - 126 U/L   Total Bilirubin 2.0 (H) 0.3 - 1.2 mg/dL   GFR calc non Af Amer 15 (L) >60 mL/min   GFR calc Af Amer 17 (L) >60 mL/min   Anion gap 14 5 - 15  CBC  Result Value  Ref Range   WBC 4.6 4.0 - 10.5 K/uL   RBC 3.58 (L) 4.22 - 5.81 MIL/uL   Hemoglobin 9.8 (L) 13.0 - 17.0 g/dL   HCT 30.0 (L) 39 - 52 %   MCV 83.8 80.0 - 100.0 fL   MCH 27.4 26.0 - 34.0 pg   MCHC 32.7 30.0 - 36.0 g/dL   RDW 18.4 (H) 11.5 - 15.5 %   Platelets 57 (L) 150 - 400 K/uL   nRBC 0.0 0.0 - 0.2 %  Protime-INR  Result Value Ref Range   Prothrombin Time 14.8 11.4 - 15.2 seconds   INR 1.2 0.8 - 1.2  Ammonia  Result Value Ref Range   Ammonia 67 (H) 9 - 35 umol/L   DG Chest 1 View  Result Date: 05/02/2020 CLINICAL DATA:  Cough and altered mental status EXAM: CHEST  1 VIEW COMPARISON:  04/19/2020 FINDINGS: Cardiac shadow is enlarged. Aortic calcifications are again noted. Vascular congestion is seen although improved from the prior exam likely related to a degree of volume overload. No focal confluent infiltrate is seen. No bony abnormality is noted. IMPRESSION: Vascular congestion likely related to fluid overload. Electronically Signed   By: Inez Catalina M.D.   On: 05/02/2020 13:38   DG Abdomen 1 View  Result Date: 04/16/2020 CLINICAL DATA:  Vomiting blood.  Recent dialysis EXAM: X-RAY ABDOMEN 1 VIEW COMPARISON:  None. FINDINGS: No dilated loops of large or small bowel. Gas and stool in the rectum. No pathologic calcifications. No organomegaly. No acute osseous abnormality IMPRESSION: No acute abdominal findings. Electronically Signed   By: Suzy Bouchard M.D.   On: 04/16/2020  04:33   DG Chest Port 1 View  Result Date: 04/19/2020 CLINICAL DATA:  Abnormal respirations EXAM: PORTABLE CHEST 1 VIEW COMPARISON:  04/16/2020 FINDINGS: New bilateral airspace disease, right greater than left. Heart is borderline in size. Small right pleural effusion. No pneumothorax. IMPRESSION: New bilateral airspace disease, right greater than left. This could reflect asymmetric edema or infection. Small right effusion. Electronically Signed   By: Rolm Baptise M.D.   On: 04/19/2020 08:27   DG Chest Port 1  View  Result Date: 04/16/2020 CLINICAL DATA:  Questionable sepsis EXAM: PORTABLE CHEST 1 VIEW COMPARISON:  12/20/2019 FINDINGS: Normal mediastinum and cardiac silhouette. Normal pulmonary vasculature. No evidence of effusion, infiltrate, or pneumothorax. No acute bony abnormality. IMPRESSION: No acute cardiopulmonary process. Electronically Signed   By: Suzy Bouchard M.D.   On: 04/16/2020 04:31   DG Foot Complete Right  Result Date: 04/04/2020 Please see detailed radiograph report in office note.  CT Angio Abd/Pel w/ and/or w/o  Result Date: 04/17/2020 CLINICAL DATA:  Abdominal pain and distension. EXAM: CTA ABDOMEN AND PELVIS WITHOUT AND WITH CONTRAST TECHNIQUE: Multidetector CT imaging of the abdomen and pelvis was performed using the standard protocol during bolus administration of intravenous contrast. Multiplanar reconstructed images and MIPs were obtained and reviewed to evaluate the vascular anatomy. CONTRAST:  165m OMNIPAQUE IOHEXOL 350 MG/ML SOLN COMPARISON:  May 24, 2019 FINDINGS: VASCULAR Aorta: Normal caliber aorta without aneurysm, dissection, vasculitis or significant stenosis. Celiac: Patent without evidence of aneurysm, dissection, vasculitis or significant stenosis. SMA: Patent without evidence of aneurysm, dissection, vasculitis or significant stenosis. Renals: Both renal arteries are patent without evidence of aneurysm, dissection, vasculitis, fibromuscular dysplasia or significant stenosis. IMA: Patent without evidence of aneurysm, dissection, vasculitis or significant stenosis. Inflow: Patent without evidence of aneurysm, dissection, vasculitis or significant stenosis. Proximal Outflow: Bilateral common femoral and visualized portions of the superficial and profunda femoral arteries are patent without evidence of aneurysm, dissection, vasculitis or significant stenosis. Veins: No obvious venous abnormality within the limitations of this arterial phase study. Review of the MIP  images confirms the above findings. NON-VASCULAR Lower chest: A stable 6 mm noncalcified lung nodule is seen within the anterolateral aspect of the right lower lobe. Small bilateral pleural effusions are noted. Hepatobiliary: The liver is cirrhotic in appearance. No focal liver abnormality is seen. No gallstones, gallbladder wall thickening, or biliary dilatation. Pancreas: Unremarkable. No pancreatic ductal dilatation or surrounding inflammatory changes. Spleen: The spleen is moderately enlarged with multiple dilated tortuous vessels seen along the splenic hilum. Adrenals/Urinary Tract: Adrenal glands are unremarkable. Kidneys are small in size, without renal calculi, focal lesion, or hydronephrosis. Bladder is unremarkable. Stomach/Bowel: Stomach is within normal limits. Appendix appears normal. No evidence of bowel dilatation. Mild thickening of the mid and distal sigmoid colon is seen. Lymphatic: No abnormal abdominal or pelvic lymph nodes are identified. Reproductive: The prostate gland is markedly enlarged. Other: No abdominal wall hernia or abnormality. A very small amount of abdominal free fluid is seen. Musculoskeletal: No acute or significant osseous findings. IMPRESSION: 1. Cirrhosis with findings consistent with portal hypertension. 2. Mild thickening of the mid and distal sigmoid colon, which may represent mild colitis. 3. Small bilateral pleural effusions. 4. Stable 6 mm noncalcified lung nodule within the anterolateral aspect of the right lower lobe. Correlation with 12 month follow-up chest CT is recommended to determine stability. 5. Markedly enlarged prostate gland. 6. Very small amount of abdominal free fluid. 7. No evidence of aneurysmal dilatation, dissection, vasculitis or significant stenosis  within the arterial structures of the abdomen and pelvis. Electronically Signed   By: Virgina Norfolk M.D.   On: 04/17/2020 20:38   ED ECG REPORT   Date: 05/02/2020  Rate: 89  Rhythm: normal sinus  rhythm  QRS Axis: normal  Intervals: normal  ST/T Wave abnormalities: normal  Conduction Disutrbances:none  Narrative Interpretation:   Old EKG Reviewed: unchanged  I have personally reviewed the EKG tracing    Radiology DG Chest 1 View  Result Date: 05/02/2020 CLINICAL DATA:  Cough and altered mental status EXAM: CHEST  1 VIEW COMPARISON:  04/19/2020 FINDINGS: Cardiac shadow is enlarged. Aortic calcifications are again noted. Vascular congestion is seen although improved from the prior exam likely related to a degree of volume overload. No focal confluent infiltrate is seen. No bony abnormality is noted. IMPRESSION: Vascular congestion likely related to fluid overload. Electronically Signed   By: Inez Catalina M.D.   On: 05/02/2020 13:38    Procedures Procedures (including critical care time)  Medications Ordered in ED Medications - No data to display  ED Course  I have reviewed the triage vital signs and the nursing notes.  Pertinent labs & imaging results that were available during my care of the patient were reviewed by me and considered in my medical decision making (see chart for details).    MDM Rules/Calculators/A&P                          Iv ns. Continuous pulse ox and cardiac monitoring. Stat labs.   Reviewed nursing notes and prior charts for additional history. Recent admission, TIPS/CARTO procedure reviewed.   MDM Number of Diagnoses or Management Options   Amount and/or Complexity of Data Reviewed Clinical lab tests: ordered and reviewed Tests in the radiology section of CPT: ordered and reviewed Tests in the medicine section of CPT: ordered and reviewed Discussion of test results with the performing providers: yes Decide to obtain previous medical records or to obtain history from someone other than the patient: yes Obtain history from someone other than the patient: yes Review and summarize past medical records: yes Discuss the patient with other  providers: yes Independent visualization of images, tracings, or specimens: yes  Risk of Complications, Morbidity, and/or Mortality Presenting problems: high Diagnostic procedures: high Management options: high   Await ammonia level and other labs.   Initial labs reviewed/interpreted by me -  Ammonia level is high. Lactulose po. Hct, plt, c/w prior. Wbc normal.   CXR reviewed/interpreted by me - no pna.   GIven altered ms, hyperammonemia, will consult unassigned medicine for admission (recent admission to ICU team noted, however pt does not appear to require icu level care at this time).  CRITICAL CARE RE: acute alteration in mental status/hyperammonemia, s/p TIPS/ CORTA procedure. Performed by: Mirna Mires Total critical care time: 35 minutes Critical care time was exclusive of separately billable procedures and treating other patients. Critical care was necessary to treat or prevent imminent or life-threatening deterioration. Critical care was time spent personally by me on the following activities: development of treatment plan with patient and/or surrogate as well as nursing, discussions with consultants, evaluation of patient's response to treatment, examination of patient, obtaining history from patient or surrogate, ordering and performing treatments and interventions, ordering and review of laboratory studies, ordering and review of radiographic studies, pulse oximetry and re-evaluation of patient's condition.    Final Clinical Impression(s) / ED Diagnoses Final diagnoses:  None  Rx / DC Orders ED Discharge Orders    None       Lajean Saver, MD 05/02/20 1444

## 2020-05-02 NOTE — ED Provider Notes (Signed)
[place]    CSN: [id number] Arrival date & time: 05/01/20  1546      History   Chief Complaint Chief Complaint  Patient presents with  . Nausea    since yesterday  . Emesis    since yesterday    HPI Yoshiaki Tamburro is a 69 y.o. male history of ESRD on dialysis, CHF, cirrhosis of liver with esophageal varices, DM type II, presenting today for evaluation nausea and vomiting and concern of blood pressure.  Reports that isolated episode of nausea and vomiting yesterday.  Denies blood in vomit.  Denies abdominal pain.  Has had some episodes of diarrhea which is off and on.  Typically will take 1 tablet of Imodium.   Denies blood in stool.  Also concerned is noted that his blood pressure has had diastolic numbers in the 75I.  He denies any specific dizziness or lightheadedness or change from patient's baseline.  Reports significant fatigue and low energy.  Has been ambulating on own.  Reports poor oral intake and poor appetite.  Recently admitted and transferred to [facility] for bleeding esophageal varices, had TIPS procedures.  Has follow-up with hepatology next week on the seventh.  HPI  Past Medical History:  Diagnosis Date  . Anemia   . Blind one eye   . CHF (congestive heart failure) ([place])   . Cirrhosis ([place])   . Cirrhosis ([place])   . CKD (chronic kidney disease), stage IV ([place])   . Dialysis patient ([place])   . Goodpasture's disease [facility])    on outpatient plasmapheresis/notes 11/20/2017  . Grade I diastolic dysfunction 43/32/9518  . History of anemia due to chronic kidney disease   . History of blood transfusion X 1   "UGIB; low blood count"  . History of plasmapheresis    "qod" (11/20/2017)  . Hypertension   . Pancytopenia ([place]) 08/20/2017  . Type II diabetes mellitus [facility])     Patient Active Problem List   Diagnosis Date Noted  . GIB (gastrointestinal bleeding) 04/16/2020  . Hyperkalemia, diminished renal excretion 06/01/2019  . AV fistula thrombosis, initial  encounter ([place]) 06/01/2019  . AV fistula occlusion, initial encounter ([place])   . Chronic systolic CHF (congestive heart failure) ([name])   . Encounter for other preprocedural examination 09/23/2018  . Esophageal varices ([place]) 09/23/2018  . Thrombocytopenia ([place]) 09/23/2018  . CHF (congestive heart failure) ([place]) 09/22/2018  . ESRD on dialysis ([place]) 02/06/2018  . CKD (chronic kidney disease) stage 4, GFR 15-29 ml/min (HCC) 12/19/2017  . Cirrhosis of liver with ascites ([place]) 12/09/2017  . Goodpasture's syndrome ([place]) with associated hemoptysis 11/20/2017  . Acute on chronic diastolic CHF (congestive heart failure) ([place]) 11/06/2017  . Pancytopenia ([place]) 11/06/2017  . GERD (gastroesophageal reflux disease) 11/06/2017  . Essential hypertension 11/06/2017  . Type II diabetes mellitus with renal manifestations ([place]) 08/25/2015    Past Surgical History:  Procedure Laterality Date  . AV FISTULA PLACEMENT Left 12/26/2017   Procedure: LEFT BRACHIOCEPHALIC ARTERIOVENOUS (AV) FISTULA CREATION;  Surgeon: Conrad Coconino, MD;  Location: [place];  Service: Vascular;  Laterality: Left;  . BASCILIC VEIN TRANSPOSITION Left 02/16/2018   Procedure: BRACHIOBASILIC VEIN TRANSPOSITION SECOND STAGE;  Surgeon: Conrad Chester, MD;  Location: [place];  Service: Vascular;  Laterality: Left;  . CATARACT EXTRACTION W/ INTRAOCULAR LENS  IMPLANT, BILATERAL Bilateral   . ESOPHAGEAL BANDING N/A 04/01/2018   Procedure: ESOPHAGEAL BANDING;  Surgeon: Otis Brace, MD;  Location: [facility];  Service: Gastroenterology;  Laterality: N/A;  .  ESOPHAGEAL BANDING N/A 06/29/2018   Procedure: ESOPHAGEAL BANDING;  Surgeon: Otis Brace, MD;  Location: [facility];  Service: Gastroenterology;  Laterality: N/A;  . ESOPHAGOGASTRODUODENOSCOPY (EGD) WITH PROPOFOL N/A 04/01/2018   Procedure: ESOPHAGOGASTRODUODENOSCOPY (EGD) WITH PROPOFOL;  Surgeon: Otis Brace, MD;  [facility];  Service: Gastroenterology;  Laterality:  N/A;  . ESOPHAGOGASTRODUODENOSCOPY (EGD) WITH PROPOFOL N/A 06/29/2018   Procedure: ESOPHAGOGASTRODUODENOSCOPY (EGD) WITH PROPOFOL;  Surgeon: Otis Brace, MD;  [facility]: [facility];  Service: Gastroenterology;  Laterality: N/A;  . ESOPHAGOGASTRODUODENOSCOPY (EGD) WITH PROPOFOL N/A 09/30/2018   Procedure: ESOPHAGOGASTRODUODENOSCOPY (EGD) WITH PROPOFOL;  Surgeon: Otis Brace, MD;  [facility]: [facility];  Service: Gastroenterology;  Laterality: N/A;  . ESOPHAGOGASTRODUODENOSCOPY (EGD) WITH PROPOFOL N/A 04/20/2019   Procedure: ESOPHAGOGASTRODUODENOSCOPY (EGD) WITH PROPOFOL;  Surgeon: Otis Brace, MD;  [facility];  Service: Gastroenterology;  Laterality: N/A;  . ESOPHAGOGASTRODUODENOSCOPY (EGD) WITH PROPOFOL N/A 04/17/2020   Procedure: ESOPHAGOGASTRODUODENOSCOPY (EGD) WITH PROPOFOL;  Surgeon: Wonda Horner, MD;  Location: [place];  Service: Gastroenterology;  Laterality: N/A;  . I & D EXTREMITY Left 02/18/2018   Procedure: IRRIGATION AND DEBRIDEMENT LEFT ARM;  Surgeon: Serafina Mitchell, MD;  Location: [facility];  Service: Vascular;  Laterality: Left;  . INSERTION OF DIALYSIS CATHETER N/A 01/19/2018   Procedure: INSERTION OF DIALYSIS CATHETER - RIGHT INTERNAL JUGULAR PLACEMENT;  Surgeon: Rosetta Posner, MD;  Location: [place];  Service: Vascular;  Laterality: N/A;  . IR FLUORO GUIDE CV LINE RIGHT  11/10/2017  . IR PARACENTESIS  12/10/2017  . IR PARACENTESIS  12/26/2017  . IR REMOVAL TUN CV CATH W/O FL  11/28/2017  . IR THROMBECTOMY AV FISTULA W/THROMBOLYSIS/PTA INC/SHUNT/IMG LEFT Left 06/02/2019  . IR US GUIDE VASC ACCESS LEFT  06/02/2019  . IR US GUIDE VASC ACCESS RIGHT  11/10/2017  . NECK SURGERY  2007   "back of neck; no hardware in there"       Home Medications    Prior to Admission medications   Medication Sig Start Date End Date Taking? Authorizing Provider  amLODipine (NORVASC) 10 MG tablet Take 5 mg by mouth daily.    [provider]  calcium acetate  (PHOSLO) 667 MG capsule Take 2 capsules (1,334 mg total) by mouth 3 (three) times daily with meals. Patient taking differently: Take 667 mg by mouth 3 (three) times daily with meals.  01/26/18   Purohit, Konrad Dolores, MD  carvedilol (COREG) 12.5 MG tablet Take 25 mg by mouth 2 (two) times daily. 03/29/20   [provider]  cetirizine (ZYRTEC) 5 MG tablet Take 1 tablet (5 mg total) by mouth 3 (three) times a week. Before dialysis 01/07/20   Rutherford Guys, MD  doxycycline (VIBRA-TABS) 100 MG tablet Take 1 tablet (100 mg total) by mouth 2 (two) times daily. 04/03/20   Trula Slade, DPM  glucose blood (ONETOUCH VERIO) test strip USE 1 STRIP TO CHECK GLUCOSE THREE TIMES DAILY. DX:E11.65 01/31/20   Elayne Snare, MD  insulin degludec (TRESIBA FLEXTOUCH) 100 UNIT/ML SOPN FlexTouch Pen Inject 0.14 mLs (14 Units total) into the skin daily. Inject 14 units under the skin once daily. Patient taking differently: Inject 12 Units into the skin daily.  03/02/19   Elayne Snare, MD  Insulin Pen Needle (ADVOCATE INSULIN PEN NEEDLES) 31G X 5 MM MISC Use four times daily to inject insulin 04/27/18   Elayne Snare, MD  insulin regular (NOVOLIN R,HUMULIN R) 100 units/mL injection Inject 8-10 Units into the skin See admin instructions. INJECT 8 UNITS  UNDER THE SKIN AT BREAKFAST, 10 UNITS AT LUNCH AND 8 UNITS AT DINNER.    [provider]  lidocaine-prilocaine (EMLA) cream Apply 1 application topically Every Tuesday,Thursday,and Saturday with dialysis. 08/24/18   [provider]  loperamide (IMODIUM) 2 MG capsule Take 2 capsules (4 mg total) by mouth as needed for diarrhea or loose stools. 05/01/20   Emersen Mascari C, PA-C  Multiple Vitamins-Minerals (MULTIVITAMIN WITH MINERALS) tablet Take 1 tablet by mouth daily.    [provider]  mupirocin ointment (BACTROBAN) 2 % Apply 1 application topically 2 (two) times daily. 04/03/20   Trula Slade, DPM  ondansetron (ZOFRAN ODT) 4 MG disintegrating  tablet Take 1 tablet (4 mg total) by mouth 2 (two) times daily as needed for nausea or vomiting. 05/01/20   Chad Donoghue, Madelynn Done C, PA-C  ONE TOUCH LANCETS MISC Use to test blood sugar three times daily 04/27/18   Elayne Snare, MD  pantoprazole (PROTONIX) 40 MG injection Inject 40 mg into the vein every 12 (twelve) hours for 1 day. 04/19/20 04/20/20  Bowser, Laurel Dimmer, NP  Polyethyl Glycol-Propyl Glycol (SYSTANE) 0.4-0.3 % SOLN Place 1 drop into both eyes 3 (three) times daily as needed (for dry eyes).    [provider]  rosuvastatin (CRESTOR) 5 MG tablet Take 1 tablet by mouth once daily 04/25/20   Elayne Snare, MD    Family History Family History  Problem Relation Age of Onset  . Diabetes Mellitus II Sister   . Diabetes Sister   . Stroke Brother   . Heart attack Brother     Social History Social History   Tobacco Use  . Smoking status: Never Smoker  . Smokeless tobacco: Never Used  Vaping Use  . Vaping Use: Never used  Substance Use Topics  . Alcohol use: Never  . Drug use: Never     Allergies   Heparin and Pork-derived products   Review of Systems Review of Systems  Constitutional: Positive for fatigue. Negative for fever.  HENT: Negative for congestion, sinus pressure and sore throat.   Eyes: Negative for photophobia, pain and visual disturbance.  Respiratory: Negative for cough and shortness of breath.   Cardiovascular: Negative for chest pain.  Gastrointestinal: Negative for abdominal pain, nausea and vomiting.  Genitourinary: Negative for decreased urine volume and hematuria.  Musculoskeletal: Negative for myalgias, neck pain and neck stiffness.  Neurological: Positive for weakness. Negative for dizziness, syncope, facial asymmetry, speech difficulty, light-headedness, numbness and headaches.     Physical Exam Triage Vital Signs ED Triage Vitals  Enc Vitals Group     BP 05/01/20 1716 (!) 155/45     Pulse Rate 05/01/20 1716 78     Resp 05/01/20 1716 17      Temp 05/01/20 1716 98.3 F (36.8 C)     Temp Source 05/01/20 1716 Oral     SpO2 05/01/20 1716 100 %     Weight --      Height --      Head Circumference --      Peak Flow --      Pain Score 05/01/20 1718 0     Pain Loc --      Pain Edu? --      Excl. in [place]? --    No data found.  Updated Vital Signs BP (!) 155/45 (BP Location: Right Arm)   Pulse 78   Temp 98.3 F (36.8 C) (Oral)   Resp 17   SpO2 100%   Visual Acuity  Right Eye Distance:   Left Eye Distance:   Bilateral Distance:    Right Eye Near:   Left Eye Near:    Bilateral Near:     Physical Exam Vitals and nursing note reviewed.  Constitutional:      Appearance: He is well-developed.     Comments: No acute distress  HENT:     Head: Normocephalic and atraumatic.     Nose: Nose normal.     Mouth/Throat:     Comments: Oral mucosa pink and moist, no tonsillar enlargement or exudate. Posterior pharynx patent and nonerythematous, no uvula deviation or swelling. Normal phonation. Eyes:     Extraocular Movements: Extraocular movements intact.     Conjunctiva/sclera: Conjunctivae normal.     Pupils: Pupils are equal, round, and reactive to light.  Cardiovascular:     Rate and Rhythm: Normal rate.  Pulmonary:     Effort: Pulmonary effort is normal. No respiratory distress.     Comments: Breathing comfortably at rest, CTABL, no wheezing, rales or other adventitious sounds auscultated Abdominal:     General: There is no distension.     Comments: Soft, nondistended, nontender palpation throughout abdomen  Musculoskeletal:        General: Normal range of motion.     Cervical back: Neck supple.     Comments: No lower leg swelling or redness  Skin:    General: Skin is warm and dry.  Neurological:     Mental Status: He is alert and oriented to person, place, and time.      UC Treatments / Results  Labs (all labs ordered are listed, but only abnormal results are displayed) Labs Reviewed  CBC - Abnormal; Notable  for the following components:      Result Value   WBC 3.8 (*)    RBC 3.66 (*)    Hemoglobin 9.7 (*)    HCT 31.0 (*)    RDW 18.6 (*)    Platelets 77 (*)    All other components within normal limits  BASIC METABOLIC PANEL - Abnormal; Notable for the following components:   Glucose, Bld 281 (*)    BUN 31 (*)    Creatinine, Ser 6.38 (*)    GFR calc non Af Amer 8 (*)    GFR calc Af Amer 9 (*)    All other components within normal limits  CBG MONITORING, ED - Abnormal; Notable for the following components:   Glucose-Capillary 261 (*)    All other components within normal limits    EKG   Radiology No results found.  Procedures Procedures (including critical care time)  Medications Ordered in UC Medications - No data to display  Initial Impression / Assessment and Plan / UC Course  I have reviewed the triage vital signs and the nursing notes.  Pertinent labs & imaging results that were available during my care of the patient were reviewed by me and considered in my medical decision making (see chart for details).  Clinical Course as of May 03 1211  Mon May 01, 2020  1804 150/49   [HW]    Clinical Course User Index [HW] Gabriele Zwilling C, PA-C    Blood sugar 271 in clinic, recheck blood pressure and was 150/49.  Has had prior readings with similar diastolic readings.  Nonbloody emesis yesterday, tolerating oral intake today.  Denies abdominal pain.  We will recheck basic labs of CBC and BMP and ensure stable from prior hospital visit.  Will call if significantly abnormal.  Overall blood work appears stable or improving.  Creatinine is elevated from 3.5-6.4, but patient is already on dialysis.  Recommending continuing medicines prescribed at discharge from hospital, will provide Zofran to use for nausea, does appear to limit dose to less than 8 mg/day with hepatic impairment.  Continue with Imodium for diarrhea.  Son reports he has overall been at his baseline just continued  fatigue and weakness.  Recommending to have patient follow-up in emergency room if noticing any worsening of symptoms.   Discussed strict return precautions. Patient verbalized understanding and is agreeable with plan.  Final Clinical Impressions(s) / UC Diagnoses   Final diagnoses:  Nausea vomiting and diarrhea  Decreased diastolic blood pressure     Discharge Instructions     Zofran for nausea Imodium 4 mg for diarrhea Blood work pending- I will call if abnormal If symptoms worsening go to emergency room Follow up outpatient as planned   ED Prescriptions    Medication Sig Dispense Auth. Provider   loperamide (IMODIUM) 2 MG capsule Take 2 capsules (4 mg total) by mouth as needed for diarrhea or loose stools. 12 capsule Finnigan Warriner C, PA-C   ondansetron (ZOFRAN ODT) 4 MG disintegrating tablet Take 1 tablet (4 mg total) by mouth 2 (two) times daily as needed for nausea or vomiting. 20 tablet Danyell Shader, [place] C, PA-C     PDMP not reviewed this encounter.   Janith Lima, [place] 05/02/20 1217

## 2020-05-02 NOTE — Progress Notes (Signed)
Report received. His room is ready.

## 2020-05-03 DIAGNOSIS — Z515 Encounter for palliative care: Secondary | ICD-10-CM

## 2020-05-03 DIAGNOSIS — R4182 Altered mental status, unspecified: Secondary | ICD-10-CM | POA: Diagnosis not present

## 2020-05-03 DIAGNOSIS — K219 Gastro-esophageal reflux disease without esophagitis: Secondary | ICD-10-CM | POA: Diagnosis not present

## 2020-05-03 DIAGNOSIS — Z833 Family history of diabetes mellitus: Secondary | ICD-10-CM | POA: Diagnosis not present

## 2020-05-03 DIAGNOSIS — N186 End stage renal disease: Secondary | ICD-10-CM | POA: Diagnosis not present

## 2020-05-03 DIAGNOSIS — E46 Unspecified protein-calorie malnutrition: Secondary | ICD-10-CM | POA: Diagnosis not present

## 2020-05-03 DIAGNOSIS — Z823 Family history of stroke: Secondary | ICD-10-CM | POA: Diagnosis not present

## 2020-05-03 DIAGNOSIS — D631 Anemia in chronic kidney disease: Secondary | ICD-10-CM | POA: Diagnosis not present

## 2020-05-03 DIAGNOSIS — K760 Fatty (change of) liver, not elsewhere classified: Secondary | ICD-10-CM | POA: Diagnosis not present

## 2020-05-03 DIAGNOSIS — K7290 Hepatic failure, unspecified without coma: Secondary | ICD-10-CM | POA: Diagnosis not present

## 2020-05-03 DIAGNOSIS — K7460 Unspecified cirrhosis of liver: Secondary | ICD-10-CM | POA: Diagnosis not present

## 2020-05-03 DIAGNOSIS — R188 Other ascites: Secondary | ICD-10-CM | POA: Diagnosis not present

## 2020-05-03 DIAGNOSIS — Z20822 Contact with and (suspected) exposure to covid-19: Secondary | ICD-10-CM | POA: Diagnosis not present

## 2020-05-03 DIAGNOSIS — I132 Hypertensive heart and chronic kidney disease with heart failure and with stage 5 chronic kidney disease, or end stage renal disease: Secondary | ICD-10-CM | POA: Diagnosis not present

## 2020-05-03 DIAGNOSIS — I120 Hypertensive chronic kidney disease with stage 5 chronic kidney disease or end stage renal disease: Secondary | ICD-10-CM | POA: Diagnosis not present

## 2020-05-03 DIAGNOSIS — D6959 Other secondary thrombocytopenia: Secondary | ICD-10-CM | POA: Diagnosis not present

## 2020-05-03 DIAGNOSIS — E8770 Fluid overload, unspecified: Secondary | ICD-10-CM | POA: Diagnosis not present

## 2020-05-03 DIAGNOSIS — M310 Hypersensitivity angiitis: Secondary | ICD-10-CM | POA: Diagnosis not present

## 2020-05-03 DIAGNOSIS — H5440 Blindness, one eye, unspecified eye: Secondary | ICD-10-CM | POA: Diagnosis not present

## 2020-05-03 DIAGNOSIS — E1165 Type 2 diabetes mellitus with hyperglycemia: Secondary | ICD-10-CM | POA: Diagnosis not present

## 2020-05-03 DIAGNOSIS — Z7682 Awaiting organ transplant status: Secondary | ICD-10-CM | POA: Diagnosis not present

## 2020-05-03 DIAGNOSIS — N2581 Secondary hyperparathyroidism of renal origin: Secondary | ICD-10-CM | POA: Diagnosis not present

## 2020-05-03 DIAGNOSIS — Z992 Dependence on renal dialysis: Secondary | ICD-10-CM | POA: Diagnosis not present

## 2020-05-03 DIAGNOSIS — I5032 Chronic diastolic (congestive) heart failure: Secondary | ICD-10-CM | POA: Diagnosis not present

## 2020-05-03 DIAGNOSIS — Z7189 Other specified counseling: Secondary | ICD-10-CM

## 2020-05-03 DIAGNOSIS — Z6824 Body mass index (BMI) 24.0-24.9, adult: Secondary | ICD-10-CM | POA: Diagnosis not present

## 2020-05-03 DIAGNOSIS — R042 Hemoptysis: Secondary | ICD-10-CM | POA: Diagnosis not present

## 2020-05-03 DIAGNOSIS — Z8249 Family history of ischemic heart disease and other diseases of the circulatory system: Secondary | ICD-10-CM | POA: Diagnosis not present

## 2020-05-03 DIAGNOSIS — K7291 Hepatic failure, unspecified with coma: Secondary | ICD-10-CM | POA: Diagnosis not present

## 2020-05-03 DIAGNOSIS — E1122 Type 2 diabetes mellitus with diabetic chronic kidney disease: Secondary | ICD-10-CM | POA: Diagnosis not present

## 2020-05-03 LAB — COMPREHENSIVE METABOLIC PANEL
ALT: 22 U/L (ref 0–44)
AST: 27 U/L (ref 15–41)
Albumin: 2.3 g/dL — ABNORMAL LOW (ref 3.5–5.0)
Alkaline Phosphatase: 85 U/L (ref 38–126)
Anion gap: 14 (ref 5–15)
BUN: 26 mg/dL — ABNORMAL HIGH (ref 8–23)
CO2: 23 mmol/L (ref 22–32)
Calcium: 8.8 mg/dL — ABNORMAL LOW (ref 8.9–10.3)
Chloride: 100 mmol/L (ref 98–111)
Creatinine, Ser: 5.58 mg/dL — ABNORMAL HIGH (ref 0.61–1.24)
GFR calc Af Amer: 11 mL/min — ABNORMAL LOW (ref >60)
GFR calc non Af Amer: 10 mL/min — ABNORMAL LOW (ref >60)
Glucose, Bld: 310 mg/dL — ABNORMAL HIGH (ref 70–99)
Potassium: 4.2 mmol/L (ref 3.5–5.1)
Sodium: 137 mmol/L (ref 135–145)
Total Bilirubin: 2 mg/dL — ABNORMAL HIGH (ref 0.3–1.2)
Total Protein: 6 g/dL — ABNORMAL LOW (ref 6.5–8.1)

## 2020-05-03 LAB — GLUCOSE, CAPILLARY
Glucose-Capillary: 296 mg/dL — ABNORMAL HIGH (ref 70–99)
Glucose-Capillary: 277 mg/dL — ABNORMAL HIGH (ref 70–99)
Glucose-Capillary: 282 mg/dL — ABNORMAL HIGH (ref 70–99)
Glucose-Capillary: 215 mg/dL — ABNORMAL HIGH (ref 70–99)

## 2020-05-03 LAB — CBC
HCT: 27.9 % — ABNORMAL LOW (ref 39.0–52.0)
Hemoglobin: 8.9 g/dL — ABNORMAL LOW (ref 13.0–17.0)
MCH: 27.1 pg (ref 26.0–34.0)
MCHC: 31.9 g/dL (ref 30.0–36.0)
MCV: 84.8 fL (ref 80.0–100.0)
Platelets: 60 10*3/uL — ABNORMAL LOW (ref 150–400)
RBC: 3.29 MIL/uL — ABNORMAL LOW (ref 4.22–5.81)
RDW: 18.2 % — ABNORMAL HIGH (ref 11.5–15.5)
WBC: 3.5 10*3/uL — ABNORMAL LOW (ref 4.0–10.5)
nRBC: 0 % (ref 0.0–0.2)

## 2020-05-03 LAB — AMMONIA: Ammonia: 40 umol/L — ABNORMAL HIGH (ref 9–35)

## 2020-05-03 MED ORDER — DARBEPOETIN ALFA 60 MCG/0.3ML IJ SOSY: 60.0000 ug | PREFILLED_SYRINGE | INTRAMUSCULAR | Status: DC

## 2020-05-03 MED ORDER — PANTOPRAZOLE SODIUM 40 MG PO TBEC
40.0000 mg | DELAYED_RELEASE_TABLET | ORAL | Status: DC
  Administered 2020-05-03: 40 mg via ORAL
  Filled 2020-05-03: qty 1

## 2020-05-03 MED ORDER — LACTULOSE 10 GM/15ML PO SOLN
10.0000 g | Freq: Two times a day (BID) | ORAL | Status: DC
  Administered 2020-05-03 – 2020-05-04 (×3): 10 g via ORAL
  Filled 2020-05-03 (×3): qty 15

## 2020-05-03 MED ORDER — INSULIN GLARGINE 100 UNIT/ML ~~LOC~~ SOLN
14.0000 [IU] | Freq: Every day | SUBCUTANEOUS | Status: DC
  Administered 2020-05-03: 14 [IU] via SUBCUTANEOUS
  Filled 2020-05-03 (×2): qty 0.14

## 2020-05-03 MED ORDER — INSULIN ASPART 100 UNIT/ML ~~LOC~~ SOLN
2.0000 [IU] | Freq: Three times a day (TID) | SUBCUTANEOUS | Status: DC
  Administered 2020-05-03 – 2020-05-04 (×2): 2 [IU] via SUBCUTANEOUS

## 2020-05-03 MED ORDER — CHLORHEXIDINE GLUCONATE CLOTH 2 % EX PADS
6.0000 | MEDICATED_PAD | Freq: Every day | CUTANEOUS | Status: DC
  Administered 2020-05-04: 6 via TOPICAL

## 2020-05-03 NOTE — Progress Notes (Signed)
PROGRESS NOTE    William [name]  AGT:[id number] DOB: 07/16/1951 DOA: 05/02/2020 PCP: Horald Pollen, MD  Brief Narrative: This is a chronically ill 69 year old male with history of Goodpasture's syndrome, end-stage renal disease on hemodialysis, end-stage liver cirrhosis/NAFLD, chronic anemia/thrombocytopenia, type 2 diabetes mellitus was recently admitted to [facility] with upper GI bleeding underwent an endoscopy which showed large ulcerated gastric varices and gastrojejunal shunt he was transferred to [facility] where he underwent TIPS/CA RTO (transvenous obliteration and coagulation of gastric varices and gastrojejunal shunt) on 9/16 subsequently discharged home on 9/23. -Presented to the emergency room 9/28 at [facility] with worsening confusion for 1 day, he was taken for dialysis however was too confused and would not sit still, subsequently sent to the emergency room. -Work-up was notable for hepatic encephalopathy with elevated ammonia levels   Assessment & Plan:   Hepatic encephalopathy Decompensated liver cirrhosis History of end-stage liver disease/NAFLD -Followed by transplant team at [facility] and reportedly on a transplant list -Recently underwent TIPS/CARTO, which increases risk of hepatic encephalopathy -Started on lactulose yesterday, clinically improving, mental status better per son but having very frequent stools all night -Continue lactulose today, will decrease dose -Labs in a.m. -PT OT eval  End-stage renal disease on hemodialysis History of Goodpasture's syndrome -Did not complete hemodialysis yesterday, nephrology consulting -?  HD today  Type 2 diabetes mellitus -CBGs poorly controlled, will increase Lantus and add meal coverage NovoLog  Chronic diastolic CHF -Volume managed with hemodialysis  Anemia of chronic disease Chronic thrombocytopenia -Continue EPO with hemodialysis -Monitor  Goals: This is a chronically ill male with ESRD and end-stage liver disease,  decompensated cirrhosis on the transplant list at [facility] -He is stable but remains at high risk of decompensation and complications, this was discussed with patient's son at bedside -Status post palliative care meeting yesterday, patient and family wishes full code and full scope of treatment including transplant  DVT prophylaxis: SCDs Code Status: Full code Family Communication: Son at bedside Disposition Plan:  Status is: Inpatient  Remains inpatient appropriate because:Inpatient level of care appropriate due to severity of illness   Dispo: The patient is from: Home              Anticipated d/c is to: Home              Anticipated d/c date is:1- 2 days              Patient currently is not medically stable to d/c.  Consultants:   Renal   Procedures:   Antimicrobials:    Subjective: -Had very frequent bowel movements yesterday following lactulose enema and p.o. -Mental status improving  Objective: Vitals:   05/02/20 2232 05/03/20 0254 05/03/20 0621 05/03/20 0849  BP: (!) 149/105 (!) 149/63 (!) 147/64 (!) 144/50  Pulse: 80 74 77 78  Resp: 15 17 17 18   Temp: 98.8 F (37.1 C) 98.6 F (37 C) 99.1 F (37.3 C) 99.7 F (37.6 C)  TempSrc:  Oral Oral Oral  SpO2: 95% 99% 99% 93%  Weight: 65.1 kg       Intake/Output Summary (Last 24 hours) at 05/03/2020 1349 Last data filed at 05/03/2020 1013 Gross per 24 hour  Intake 120 ml  Output 0 ml  Net 120 ml   Filed Weights   05/02/20 2232  Weight: 65.1 kg    Examination:  General exam: Chronically ill male appears much older than stated age, awake alert oriented to self, place CVS: S1-S2, regular rate rhythm Lungs:  Few basilar rales, otherwise clear Abdomen: Soft, nontender, bowel sounds present Central nervous system: Awake and alert, no asterixis noted, moves all extremities no localizing signs Extremities: No edema Psychiatry: Flat affect   Data Reviewed:   CBC: Recent Labs  Lab 05/01/20 1834 05/02/20 1208  05/03/20 0732  WBC 3.8* 4.6 3.5*  HGB 9.7* 9.8* 8.9*  HCT 31.0* 30.0* 27.9*  MCV 84.7 83.8 84.8  PLT 77* 57* 60*   Basic Metabolic Panel: Recent Labs  Lab 05/01/20 1834 05/02/20 1208 05/03/20 0732  NA 137 136 137  K 4.2 4.4 4.2  CL 102 100 100  CO2 23 22 23   GLUCOSE 281* 205* 310*  BUN 31* 17 26*  CREATININE 6.38* 3.90* 5.58*  CALCIUM 8.9 9.3 8.8*   GFR: Estimated Creatinine Clearance: 11 mL/min (A) (by C-G formula based on SCr of 5.58 mg/dL (H)). Liver Function Tests: Recent Labs  Lab 05/02/20 1208 05/03/20 0732  AST 34 27  ALT 23 22  ALKPHOS 100 85  BILITOT 2.0* 2.0*  PROT 6.3* 6.0*  ALBUMIN 2.4* 2.3*   No results for input(s): LIPASE, AMYLASE in the last 168 hours. Recent Labs  Lab 05/02/20 1208 05/03/20 0732  AMMONIA 67* 40*   Coagulation Profile: Recent Labs  Lab 05/02/20 1208  INR 1.2   Cardiac Enzymes: No results for input(s): CKTOTAL, CKMB, CKMBINDEX, TROPONINI in the last 168 hours. BNP (last 3 results) No results for input(s): PROBNP in the last 8760 hours. HbA1C: Recent Labs    05/02/20 1750  HGBA1C 6.1*   CBG: Recent Labs  Lab 05/01/20 1902 05/02/20 1643 05/02/20 2233 05/03/20 0647 05/03/20 1127  GLUCAP 261* 222* 168* 296* 277*   Lipid Profile: No results for input(s): CHOL, HDL, LDLCALC, TRIG, CHOLHDL, LDLDIRECT in the last 72 hours. Thyroid Function Tests: No results for input(s): TSH, T4TOTAL, FREET4, T3FREE, THYROIDAB in the last 72 hours. Anemia Panel: No results for input(s): VITAMINB12, FOLATE, FERRITIN, TIBC, IRON, RETICCTPCT in the last 72 hours. Urine analysis:    Component Value Date/Time   COLORURINE YELLOW 04/18/2020 0118   APPEARANCEUR HAZY (A) 04/18/2020 0118   LABSPEC 1.027 04/18/2020 0118   PHURINE 5.0 04/18/2020 0118   GLUCOSEU NEGATIVE 04/18/2020 0118   HGBUR NEGATIVE 04/18/2020 0118   BILIRUBINUR NEGATIVE 04/18/2020 0118   BILIRUBINUR neg 09/14/2015 1441   KETONESUR NEGATIVE 04/18/2020 0118    PROTEINUR 30 (A) 04/18/2020 0118   UROBILINOGEN 0.2 09/14/2015 1441   UROBILINOGEN 0.2 01/27/2013 2052   NITRITE NEGATIVE 04/18/2020 0118   LEUKOCYTESUR NEGATIVE 04/18/2020 0118   Sepsis Labs: @LABRCNTIP (procalcitonin:4,lacticidven:4)  ) Recent Results (from the past 240 hour(s))  Respiratory Panel by RT PCR (Flu A&B, Covid) - Nasopharyngeal Swab     Status: None   Collection Time: 05/02/20  4:45 PM   Specimen: Nasopharyngeal Swab  Result Value Ref Range Status   SARS Coronavirus 2 by RT PCR NEGATIVE NEGATIVE Final    Comment: (NOTE) SARS-CoV-2 target nucleic acids are NOT DETECTED.  The SARS-CoV-2 RNA is generally detectable in upper respiratoy specimens during the acute phase of infection. The lowest concentration of SARS-CoV-2 viral copies this assay can detect is 131 copies/mL. A negative result does not preclude SARS-Cov-2 infection and should not be used as the sole basis for treatment or other patient management decisions. A negative result may occur with  improper specimen collection/handling, submission of specimen other than nasopharyngeal swab, presence of viral mutation(s) within the areas targeted by this assay, and inadequate number of viral copies (<131  copies/mL). A negative result must be combined with clinical observations, patient history, and epidemiological information. The expected result is Negative.  Fact Sheet for Patients:  PinkCheek.be  Fact Sheet for Healthcare Providers:  GravelBags.it  This test is no t yet approved or cleared by the [place] FDA and  has been authorized for detection and/or diagnosis of SARS-CoV-2 by FDA under an Emergency Use Authorization (EUA). This EUA will remain  in effect (meaning this test can be used) for the duration of the COVID-19 declaration under Section 564(b)(1) of the Act, 21 U.S.C. section 360bbb-3(b)(1), unless the authorization is terminated  or revoked sooner.     Influenza A by PCR NEGATIVE NEGATIVE Final   Influenza B by PCR NEGATIVE NEGATIVE Final    Comment: (NOTE) The Xpert Xpress SARS-CoV-2/FLU/RSV assay is intended as an aid in  the diagnosis of influenza from Nasopharyngeal swab specimens and  should not be used as a sole basis for treatment. Nasal washings and  aspirates are unacceptable for Xpert Xpress SARS-CoV-2/FLU/RSV  testing.  Fact Sheet for Patients: PinkCheek.be  Fact Sheet for Healthcare Providers: GravelBags.it  This test is not yet approved or cleared by the [place] FDA and  has been authorized for detection and/or diagnosis of SARS-CoV-2 by  FDA under an Emergency Use Authorization (EUA). This EUA will remain  in effect (meaning this test can be used) for the duration of the  Covid-19 declaration under Section 564(b)(1) of the Act, 21  U.S.C. section 360bbb-3(b)(1), unless the authorization is  terminated or revoked. Performed at [facility], [place] [address], [place], [place] [address]          Radiology Studies: DG Chest 1 View  Result Date: 05/02/2020 CLINICAL DATA:  Cough and altered mental status EXAM: CHEST  1 VIEW COMPARISON:  04/19/2020 FINDINGS: Cardiac shadow is enlarged. Aortic calcifications are again noted. Vascular congestion is seen although improved from the prior exam likely related to a degree of volume overload. No focal confluent infiltrate is seen. No bony abnormality is noted. IMPRESSION: Vascular congestion likely related to fluid overload. Electronically Signed   By: Inez Catalina M.D.   On: 05/02/2020 13:38   CT HEAD WO CONTRAST  Result Date: 05/02/2020 CLINICAL DATA:  Mental status change EXAM: CT HEAD WITHOUT CONTRAST TECHNIQUE: Contiguous axial images were obtained from the base of the skull through the vertex without intravenous contrast. COMPARISON:  None. FINDINGS: Brain: No evidence of acute  large vascular territory infarction, hemorrhage, hydrocephalus, extra-axial collection or mass lesion/mass effect. Mild patchy white matter hypoattenuation, likely the sequela of chronic microvascular ischemic disease. Mild cerebral atrophy with ex vacuo ventricular dilation. Vascular: No hyperdense vessel identified. Calcific intracranial atherosclerosis. Skull: Normal. Negative for fracture or focal lesion. Sinuses/Orbits: No acute finding. Other: No mastoid effusions. IMPRESSION: No evidence of acute intracranial abnormality. Electronically Signed   By: Margaretha Sheffield MD   On: 05/02/2020 16:21        Scheduled Meds: . amLODipine  5 mg Oral Daily  . calcium acetate  667 mg Oral TID WC  . carvedilol  25 mg Oral BID  . insulin aspart  0-5 Units Subcutaneous QHS  . insulin aspart  0-6 Units Subcutaneous TID WC  . insulin glargine  7 Units Subcutaneous QHS  . lactulose  10 g Oral BID  . lactulose  20 g Oral Once  . pantoprazole  40 mg Oral Q24H  . rosuvastatin  5 mg Oral QHS   Continuous Infusions:  LOS: 0 days    Time spent: 63mn  PDomenic Polite MD [facility]  05/03/2020, 1:49 PM

## 2020-05-03 NOTE — Progress Notes (Signed)
Patient assisted to [facility]. Had a large formed soft stool. No DIARRHEA.

## 2020-05-03 NOTE — Evaluation (Signed)
Physical Therapy Evaluation and Discharge Patient Details Name: William [name] MRN: [id number] DOB: [birth year] Today's Date: 05/03/2020   History of Present Illness  69 y.o. male  with past medical history of Goodpasture's disease, ESRD on dialysis, cirrhosis, chronic anemia, pancytopenia, hypertension, and diabetes mellitus type II. He was recently hospitalized at [facility] 9/12-9/15 with GI bleeding, underwent EGD was notable for large ulcerated gastric varices and portal hypertensive gastropathy. He was transferred to [facility] and underwent TIPS/CARTO procedures on 9/16.   Clinical Impression   Patient evaluated by Physical Therapy with no further acute PT needs identified. All education has been completed and the patient has no further questions.  See below for any follow-up Physical Therapy or equipment needs. PT is signing off. Thank you for this referral.  Recommend that William [name] walks the hallways a few times a day while here in hospital     Follow Up Recommendations No PT follow up    Equipment Recommendations  None recommended by PT    Recommendations for Other Services       Precautions / Restrictions Precautions Precautions: None Restrictions Weight Bearing Restrictions: No      Mobility  Bed Mobility Overal bed mobility: Independent                Transfers Overall transfer level: Independent                  Ambulation/Gait Ambulation/Gait assistance: Independent Gait Distance (Feet): 300 Feet Assistive device: None Gait Pattern/deviations: Step-through pattern;WFL(Within Functional Limits)     General Gait Details: No difficulty  Stairs Stairs: Yes Stairs assistance: Modified independent (Device/Increase time) Stair Management: Two rails;Alternating pattern;Forwards Number of Stairs: 12 General stair comments: Slightly winded after flight of steps, but overall no difficulty noted  Wheelchair Mobility    Modified Rankin (Stroke Patients Only)        Balance Overall balance assessment: No apparent balance deficits (not formally assessed)                                           Pertinent Vitals/Pain Pain Assessment: No/denies pain    Home Living Family/patient expects to be discharged to:: Private residence Living Arrangements: Spouse/significant other;Children Available Help at Discharge: Family Type of Home: House Home Access: Stairs to enter       Home Equipment: None      Prior Function Level of Independence: Independent               Hand Dominance        Extremity/Trunk Assessment   Upper Extremity Assessment Upper Extremity Assessment: Overall WFL for tasks assessed    Lower Extremity Assessment Lower Extremity Assessment: Overall WFL for tasks assessed       Communication   Communication: Prefers language other than [place] Freight forwarder, Academic librarian, present for communication)  Cognition Arousal/Alertness: Awake/alert Behavior During Therapy: WFL for tasks assessed/performed Overall Cognitive Status: Within Functional Limits for tasks assessed                                        General Comments      Exercises     Assessment/Plan    PT Assessment Patent does not need any further PT services  PT Problem List  PT Treatment Interventions      PT Goals (Current goals can be found in the Care Plan section)  Acute Rehab PT Goals Patient Stated Goal: Agrees to ambulate PT Goal Formulation: All assessment and education complete, DC therapy    Frequency     Barriers to discharge        Co-evaluation               AM-PAC PT "6 Clicks" Mobility  Outcome Measure Help needed turning from your back to your side while in a flat bed without using bedrails?: None Help needed moving from lying on your back to sitting on the side of a flat bed without using bedrails?: None Help needed moving to and from a bed to a chair (including  a wheelchair)?: None Help needed standing up from a chair using your arms (e.g., wheelchair or bedside chair)?: None Help needed to walk in hospital room?: None Help needed climbing 3-5 steps with a railing? : None 6 Click Score: 24    End of Session   Activity Tolerance: Patient tolerated treatment well Patient left: in bed;with call bell/phone within reach;with family/visitor present Nurse Communication: Mobility status PT Visit Diagnosis: Other abnormalities of gait and mobility (R26.89)    Time: 1030-1050 PT Time Calculation (min) (ACUTE ONLY): 20 min   Charges:   PT Evaluation $PT Eval Low Complexity: 1 Low          Roney Marion, [place] Pager [phone] Office [place] 05/03/2020, 1:15 PM

## 2020-05-03 NOTE — Consult Note (Signed)
[facility] Renal Consultation Note  Requesting MD: Domenic Polite MD Indication for Consultation:  ESRD  Chief complaint: ams  HPI:   William [name] is a 69 y.o. male with a history including cirrhosis, ESRD, hypertension, and diabetes mellitus who presented to the hospital with altered mental status.  He received a couple of hours of dialysis at his outpatient unit on 9/28 per report.  AMS was worked up and felt secondary to hepatic encephalopathy per the team and they have initiated lactulose.  Mental status has improved today on 9/29 per charting.  Nephrology is consulted for assistance with management of hemodialysis.  Note that palliative care was consulted per primary team this hospitalization and the patient and the family expressed that they wish to continue full code and full treatment.  Note history of recent admission with GI bleed.  Spoke with pt via assistance of video interpreter for arabic.  He states he came "to get treatment" and doesn't offer much history as to events preceding admission.   PMHx:   Past Medical History:  Diagnosis Date  . Anemia   . Blind one eye   . CHF (congestive heart failure) ([place])   . Cirrhosis ([place])   . Cirrhosis ([place])   . CKD (chronic kidney disease), stage IV ([place])   . Dialysis patient ([place])   . Goodpasture's disease [facility])    on outpatient plasmapheresis/notes 11/20/2017  . Grade I diastolic dysfunction 84/16/6063  . History of anemia due to chronic kidney disease   . History of blood transfusion X 1   "UGIB; low blood count"  . History of plasmapheresis    "qod" (11/20/2017)  . Hypertension   . Pancytopenia ([place]) 08/20/2017  . Type II diabetes mellitus ([place])     Past Surgical History:  Procedure Laterality Date  . AV FISTULA PLACEMENT Left 12/26/2017   Procedure: LEFT BRACHIOCEPHALIC ARTERIOVENOUS (AV) FISTULA CREATION;  Surgeon: Conrad Hamburg, MD;  Location: [place];  Service: Vascular;  Laterality: Left;  . BASCILIC VEIN  TRANSPOSITION Left 02/16/2018   Procedure: BRACHIOBASILIC VEIN TRANSPOSITION SECOND STAGE;  Surgeon: Conrad [place], MD;  Location: [place];  Service: Vascular;  Laterality: Left;  . CATARACT EXTRACTION W/ INTRAOCULAR LENS  IMPLANT, BILATERAL Bilateral   . ESOPHAGEAL BANDING N/A 04/01/2018   Procedure: ESOPHAGEAL BANDING;  Surgeon: Otis Brace, MD;  [facility];  Service: Gastroenterology;  Laterality: N/A;  . ESOPHAGEAL BANDING N/A 06/29/2018   Procedure: ESOPHAGEAL BANDING;  Surgeon: Otis Brace, MD;  [facility]: [facility];  Service: Gastroenterology;  Laterality: N/A;  . ESOPHAGOGASTRODUODENOSCOPY (EGD) WITH PROPOFOL N/A 04/01/2018   Procedure: ESOPHAGOGASTRODUODENOSCOPY (EGD) WITH PROPOFOL;  Surgeon: Otis Brace, MD;  [facility];  Service: Gastroenterology;  Laterality: N/A;  . ESOPHAGOGASTRODUODENOSCOPY (EGD) WITH PROPOFOL N/A 06/29/2018   Procedure: ESOPHAGOGASTRODUODENOSCOPY (EGD) WITH PROPOFOL;  Surgeon: Otis Brace, MD;  [facility];  Service: Gastroenterology;  Laterality: N/A;  . ESOPHAGOGASTRODUODENOSCOPY (EGD) WITH PROPOFOL N/A 09/30/2018   Procedure: ESOPHAGOGASTRODUODENOSCOPY (EGD) WITH PROPOFOL;  Surgeon: Otis Brace, MD;  [facility];  Service: Gastroenterology;  Laterality: N/A;  . ESOPHAGOGASTRODUODENOSCOPY (EGD) WITH PROPOFOL N/A 04/20/2019   Procedure: ESOPHAGOGASTRODUODENOSCOPY (EGD) WITH PROPOFOL;  Surgeon: Otis Brace, MD;  [facility];  Service: Gastroenterology;  Laterality: N/A;  . ESOPHAGOGASTRODUODENOSCOPY (EGD) WITH PROPOFOL N/A 04/17/2020   Procedure: ESOPHAGOGASTRODUODENOSCOPY (EGD) WITH PROPOFOL;  Surgeon: Wonda Horner, MD;  Location: [place];  Service: Gastroenterology;  Laterality: N/A;  . I & D EXTREMITY Left 02/18/2018   Procedure:  IRRIGATION AND DEBRIDEMENT LEFT ARM;  Surgeon: Serafina Mitchell, MD;  Location: [place];  Service: Vascular;  Laterality: Left;  . INSERTION OF  DIALYSIS CATHETER N/A 01/19/2018   Procedure: INSERTION OF DIALYSIS CATHETER - RIGHT INTERNAL JUGULAR PLACEMENT;  Surgeon: Rosetta Posner, MD;  Location: [place];  Service: Vascular;  Laterality: N/A;  . IR FLUORO GUIDE CV LINE RIGHT  11/10/2017  . IR PARACENTESIS  12/10/2017  . IR PARACENTESIS  12/26/2017  . IR REMOVAL TUN CV CATH W/O FL  11/28/2017  . IR THROMBECTOMY AV FISTULA W/THROMBOLYSIS/PTA INC/SHUNT/IMG LEFT Left 06/02/2019  . IR US GUIDE VASC ACCESS LEFT  06/02/2019  . IR US GUIDE VASC ACCESS RIGHT  11/10/2017  . NECK SURGERY  2007   "back of neck; no hardware in there"    Family Hx:  Family History  Problem Relation Age of Onset  . Diabetes Mellitus II Sister   . Diabetes Sister   . Stroke Brother   . Heart attack Brother     Social History:  reports that he has never smoked. He has never used smokeless tobacco. He reports that he does not drink alcohol and does not use drugs.  Allergies:  Allergies  Allergen Reactions  . Heparin Other (See Comments)    Patient is Muslim and is not permitted Pork derative due to religious belief)  . Pork-Derived Products Other (See Comments)    Patient does not eat pork due to religious beliefs    Medications: Prior to Admission medications   Medication Sig Start Date End Date Taking? Authorizing Provider  amLODipine (NORVASC) 10 MG tablet Take 5 mg by mouth daily.   Yes [provider]  calcium acetate (PHOSLO) 667 MG capsule Take 2 capsules (1,334 mg total) by mouth 3 (three) times daily with meals. Patient taking differently: Take 667 mg by mouth 3 (three) times daily with meals.  01/26/18  Yes Purohit, Konrad Dolores, MD  carvedilol (COREG) 12.5 MG tablet Take 25 mg by mouth 2 (two) times daily. 03/29/20  Yes [provider]  cetirizine (ZYRTEC) 5 MG tablet Take 1 tablet (5 mg total) by mouth 3 (three) times a week. Before dialysis 01/07/20  Yes Rutherford Guys, MD  insulin degludec (TRESIBA FLEXTOUCH) 100 UNIT/ML SOPN FlexTouch  Pen Inject 0.14 mLs (14 Units total) into the skin daily. Inject 14 units under the skin once daily. Patient taking differently: Inject 12 Units into the skin daily.  03/02/19  Yes Elayne Snare, MD  insulin regular (NOVOLIN R,HUMULIN R) 100 units/mL injection Inject 6-8 Units into the skin See admin instructions. INJECT 6 UNITS UNDER THE SKIN AT BREAKFAST, 8 UNITS AT LUNCH AND 8 UNITS AT DINNER.   Yes [provider]  lidocaine-prilocaine (EMLA) cream Apply 1 application topically Every Tuesday,Thursday,and Saturday with dialysis. 08/24/18  Yes [provider]  loperamide (IMODIUM) 2 MG capsule Take 2 capsules (4 mg total) by mouth as needed for diarrhea or loose stools. 05/01/20  Yes [name], Hallie C, PA-C  Multiple Vitamins-Minerals (MULTIVITAMIN WITH MINERALS) tablet Take 1 tablet by mouth daily.   Yes [provider]  mupirocin ointment (BACTROBAN) 2 % Apply 1 application topically 2 (two) times daily. 04/03/20  Yes Trula Slade, DPM  ondansetron (ZOFRAN ODT) 4 MG disintegrating tablet Take 1 tablet (4 mg total) by mouth 2 (two) times daily as needed for nausea or vomiting. 05/01/20  Yes [name], Hallie C, PA-C  pantoprazole (PROTONIX) 40 MG injection Inject 40 mg into the  vein every 12 (twelve) hours for 1 day. 04/19/20 05/02/21 Yes Bowser, Laurel Dimmer, NP  Polyethyl Glycol-Propyl Glycol (SYSTANE) 0.4-0.3 % SOLN Place 1 drop into both eyes 3 (three) times daily as needed (for dry eyes).   Yes [provider]  rosuvastatin (CRESTOR) 5 MG tablet Take 1 tablet by mouth once daily Patient taking differently: Take 5 mg by mouth daily.  04/25/20  Yes Elayne Snare, MD  doxycycline (VIBRA-TABS) 100 MG tablet Take 1 tablet (100 mg total) by mouth 2 (two) times daily. Patient not taking: Reported on 05/02/2020 04/03/20   Trula Slade, DPM  glucose blood (ONETOUCH VERIO) test strip USE 1 STRIP TO CHECK GLUCOSE THREE TIMES DAILY. DX:E11.65 01/31/20   Elayne Snare, MD  Insulin  Pen Needle (ADVOCATE INSULIN PEN NEEDLES) 31G X 5 MM MISC Use four times daily to inject insulin 04/27/18   Elayne Snare, MD  ONE [facility] LANCETS MISC Use to test blood sugar three times daily 04/27/18   Elayne Snare, MD    I have reviewed the patient's reported prior to admission and current medications.  Labs:  BMP Latest Ref Rng & Units 05/03/2020 05/02/2020 05/01/2020  Glucose 70 - 99 mg/dL 310(H) 205(H) 281(H)  BUN 8 - 23 mg/dL 26(H) 17 31(H)  Creatinine 0.61 - 1.24 mg/dL 5.58(H) 3.90(H) 6.38(H)  BUN/Creat Ratio 10 - 24 - - -  Sodium 135 - 145 mmol/L 137 136 137  Potassium 3.5 - 5.1 mmol/L 4.2 4.4 4.2  Chloride 98 - 111 mmol/L 100 100 102  CO2 22 - 32 mmol/L 23 22 23   Calcium 8.9 - 10.3 mg/dL 8.8(L) 9.3 8.9    Urinalysis    Component Value Date/Time   COLORURINE YELLOW 04/18/2020 0118   APPEARANCEUR HAZY (A) 04/18/2020 0118   LABSPEC 1.027 04/18/2020 0118   PHURINE 5.0 04/18/2020 0118   GLUCOSEU NEGATIVE 04/18/2020 0118   HGBUR NEGATIVE 04/18/2020 0118   BILIRUBINUR NEGATIVE 04/18/2020 0118   BILIRUBINUR neg 09/14/2015 1441   KETONESUR NEGATIVE 04/18/2020 0118   PROTEINUR 30 (A) 04/18/2020 0118   UROBILINOGEN 0.2 09/14/2015 1441   UROBILINOGEN 0.2 01/27/2013 2052   NITRITE NEGATIVE 04/18/2020 0118   LEUKOCYTESUR NEGATIVE 04/18/2020 0118     ROS:  Pertinent items noted in HPI and remainder of comprehensive ROS otherwise negative. some limitations of AMS but seem to be resolving   Physical Exam: Vitals:   05/03/20 0849 05/03/20 1626  BP: (!) 144/50 (!) 148/66  Pulse: 78 77  Resp: 18 18  Temp: 99.7 F (37.6 C) 99.6 F (37.6 C)  SpO2: 93% 96%     General: adult male in bed in NAD  HEENT: NCAT Eyes: EOMI  Neck: supple trachea midline Heart: S1S2 no rub Lungs: clear to auscultation and unlabored on room air Abdomen:soft/nd/nt Extremities: no edema appreciated; no cyanosis or clubbing Skin: no rash on extremities exposed Neuro: awake and alert; oriented to person,  year, and location Access: LUE AVF bruit and thrill   [facility]  BF 400 DF 800  EDW 68 kg  2K /3.5 Ca bath AVF No heparin  Not on activated vit D venofer 50 mg weekly with HD mircera 75 mcg every 2 weeks with HD - hasn't yet received this dose Last tx on 9/28 - 2 hrs and 12 min; had post weight of 66.3 kg and had no UF that tx; left at 67.1 kg the tx before that Last mircera outpt was 04/11/20 at 60 mcg  Assessment/Plan:  #  Altered mental status - Felt secondary to hepatic encephalopathy elevated ammonia level was noted.  Management per primary team  # Cirrhosis with hepatic encephalopathy - Team has initiated lactulose.  Patient follows with [facility] and is per report on transplant list  # End-stage renal disease on hemodialysis Tuesday Thursday Saturday. May be able to have HD outpatient if discharged tomorrow; spoke with renal navigator to update  # Chronic diastolic CHF optimize volume with HD  # HTN  - optimize volume with HD; cont current regimen   # Anemia of CKD - for aranesp tomorrow 9/30    # Secondary hyperparathyroidism   - on phoslo. Check phos in am.   Claudia Desanctis 05/03/2020, 6:24 PM

## 2020-05-04 DIAGNOSIS — I120 Hypertensive chronic kidney disease with stage 5 chronic kidney disease or end stage renal disease: Secondary | ICD-10-CM | POA: Diagnosis not present

## 2020-05-04 DIAGNOSIS — R4182 Altered mental status, unspecified: Secondary | ICD-10-CM | POA: Diagnosis not present

## 2020-05-04 DIAGNOSIS — K7290 Hepatic failure, unspecified without coma: Secondary | ICD-10-CM | POA: Diagnosis not present

## 2020-05-04 DIAGNOSIS — K7291 Hepatic failure, unspecified with coma: Secondary | ICD-10-CM | POA: Diagnosis not present

## 2020-05-04 DIAGNOSIS — E8770 Fluid overload, unspecified: Secondary | ICD-10-CM | POA: Diagnosis not present

## 2020-05-04 DIAGNOSIS — N2581 Secondary hyperparathyroidism of renal origin: Secondary | ICD-10-CM | POA: Diagnosis not present

## 2020-05-04 DIAGNOSIS — I5032 Chronic diastolic (congestive) heart failure: Secondary | ICD-10-CM | POA: Diagnosis not present

## 2020-05-04 DIAGNOSIS — D631 Anemia in chronic kidney disease: Secondary | ICD-10-CM | POA: Diagnosis not present

## 2020-05-04 LAB — GLUCOSE, CAPILLARY
Glucose-Capillary: 174 mg/dL — ABNORMAL HIGH (ref 70–99)
Glucose-Capillary: 239 mg/dL — ABNORMAL HIGH (ref 70–99)
Glucose-Capillary: 178 mg/dL — ABNORMAL HIGH (ref 70–99)

## 2020-05-04 LAB — CBC
HCT: 29.8 % — ABNORMAL LOW (ref 39.0–52.0)
Hemoglobin: 9.4 g/dL — ABNORMAL LOW (ref 13.0–17.0)
MCH: 26.7 pg (ref 26.0–34.0)
MCHC: 31.5 g/dL (ref 30.0–36.0)
MCV: 84.7 fL (ref 80.0–100.0)
Platelets: 68 10*3/uL — ABNORMAL LOW (ref 150–400)
RBC: 3.52 MIL/uL — ABNORMAL LOW (ref 4.22–5.81)
RDW: 17.9 % — ABNORMAL HIGH (ref 11.5–15.5)
WBC: 3.8 10*3/uL — ABNORMAL LOW (ref 4.0–10.5)
nRBC: 0 % (ref 0.0–0.2)

## 2020-05-04 LAB — COMPREHENSIVE METABOLIC PANEL
ALT: 20 U/L (ref 0–44)
AST: 27 U/L (ref 15–41)
Albumin: 2.4 g/dL — ABNORMAL LOW (ref 3.5–5.0)
Alkaline Phosphatase: 93 U/L (ref 38–126)
Anion gap: 13 (ref 5–15)
BUN: 31 mg/dL — ABNORMAL HIGH (ref 8–23)
CO2: 24 mmol/L (ref 22–32)
Calcium: 9 mg/dL (ref 8.9–10.3)
Chloride: 102 mmol/L (ref 98–111)
Creatinine, Ser: 7.31 mg/dL — ABNORMAL HIGH (ref 0.61–1.24)
GFR calc Af Amer: 8 mL/min — ABNORMAL LOW (ref >60)
GFR calc non Af Amer: 7 mL/min — ABNORMAL LOW (ref >60)
Glucose, Bld: 199 mg/dL — ABNORMAL HIGH (ref 70–99)
Potassium: 3.8 mmol/L (ref 3.5–5.1)
Sodium: 139 mmol/L (ref 135–145)
Total Bilirubin: 2 mg/dL — ABNORMAL HIGH (ref 0.3–1.2)
Total Protein: 6.4 g/dL — ABNORMAL LOW (ref 6.5–8.1)

## 2020-05-04 LAB — PHOSPHORUS: Phosphorus: 3.7 mg/dL (ref 2.5–4.6)

## 2020-05-04 MED ORDER — PANTOPRAZOLE SODIUM 40 MG PO TBEC
40.0000 mg | DELAYED_RELEASE_TABLET | Freq: Two times a day (BID) | ORAL | 1 refills | Status: DC
Start: 2020-05-04 — End: 2020-11-05

## 2020-05-04 MED ORDER — LACTULOSE 10 GM/15ML PO SOLN: 10.0000 g | Freq: Two times a day (BID) | ORAL | 1 refills | Status: DC

## 2020-05-04 NOTE — Progress Notes (Signed)
Renal Navigator attempted to reschedule patient at [facility] for treatment today after discharge, however, they are unfortunately unable to accommodate patient on 2nd shift today. (Patient is normally a first shift patient). Navigator updated Attending and Nephrologist. Patient to have HD in the hospital prior to discharge.  Alphonzo Cruise, [place] Renal Navigator [phone]

## 2020-05-04 NOTE — Discharge Summary (Signed)
Physician Discharge Summary  Nalu Troublefield YTK:[id number] DOB: [birth year] DOA: 05/02/2020  PCP: Horald Pollen, MD  Admit date: 05/02/2020 Discharge date: 05/04/2020  Time spent: 35 minutes  Recommendations for Outpatient Follow-up:  1. Follow-up with [facility] [facility], patient has an upcoming appointment next month 2. PCP in 1 week   Discharge Diagnoses:  Principal Problem:   Hepatic encephalopathy ([place]) Active Problems:   Type II diabetes mellitus with renal manifestations ([place])   Essential hypertension   Goodpasture's syndrome ([place]) with associated hemoptysis   Cirrhosis of liver with ascites ([place] )   ESRD on dialysis ([place])   CHF (congestive heart failure) ([place])   Thrombocytopenia ([place])   Goals of care, counseling/discussion   Palliative care by specialist   Discharge Condition: Stable, but poor overall prognosis  Diet recommendation: Low-sodium  Filed Weights   05/02/20 2232 05/03/20 2154 05/04/20 1210  Weight: 65.1 kg 66.7 kg 66.6 kg    History of present illness:  chronically ill 69 year old male with history of Goodpasture's syndrome, end-stage renal disease on hemodialysis, end-stage liver cirrhosis/NAFLD, chronic anemia/thrombocytopenia, type 2 diabetes mellitus was recently admitted to [facility] with upper GI bleeding underwent an endoscopy which showed large ulcerated gastric varices and gastrojejunal shunt he was transferred to [facility] where he underwent TIPS/CA RTO (transvenous obliteration and coagulation of gastric varices and gastrojejunal shunt) on 9/16 subsequently discharged home on 9/23. -Presented to the emergency room 9/28 at [facility] with worsening confusion for 1 day, he was taken for dialysis however was too confused and would not sit still, subsequently sent to the emergency room. -Work-up was notable for hepatic encephalopathy with elevated ammonia levels   Hospital Course:   Hepatic encephalopathy Decompensated liver cirrhosis History of end-stage  liver disease/NAFLD -Followed by transplant team at [facility] and reportedly on a transplant list -Recently underwent TIPS/CARTO last week at [facility], which increases risk of hepatic encephalopathy -Started on lactulose on admission, clinically improving, mental status back to baseline -Ambulating in the room -Discharged home on oral lactulose twice daily to titrate for 2-3 soft stools per day, this was discussed extensively with patient son today -Advised to follow-up with [facility], patient has an upcoming appointment next week  End-stage renal disease on hemodialysis History of Goodpasture's syndrome -Followed by nephrology, dialyzed today in the hospital  Type 2 diabetes mellitus -Continue Lantus  Chronic diastolic CHF -Volume managed with hemodialysis  Anemia of chronic disease Chronic thrombocytopenia -Continue EPO with hemodialysis  Goals: This is a chronically ill male with ESRD and end-stage liver disease, decompensated cirrhosis on the transplant list at [facility] -He is stable but remains at high risk of decompensation and complications, this was discussed with patient's son at bedside -Status post palliative care meeting, patient and family wishes full code and full scope of treatment including transplant  Discharge Exam: Vitals:   05/04/20 1300 05/04/20 1330  BP: (!) 122/47 (!) 122/47  Pulse: 70 70  Resp:    Temp:    SpO2:      General: Awake alert oriented x3 Cardiovascular: S1-S2, regular rate rhythm Respiratory: Clear  Discharge Instructions    Allergies as of 05/04/2020      Reactions   Heparin Other (See Comments)   Patient is Muslim and is not permitted Pork derative due to religious belief)   Pork-derived Products Other (See Comments)   Patient does not eat pork due to religious beliefs      Medication List    STOP taking these medications   doxycycline 100 MG  tablet Commonly known as: VIBRA-TABS   loperamide 2 MG capsule Commonly known as:  IMODIUM   pantoprazole 40 MG injection Commonly known as: PROTONIX Replaced by: pantoprazole 40 MG tablet     TAKE these medications   amLODipine 10 MG tablet Commonly known as: NORVASC Take 5 mg by mouth daily.   calcium acetate 667 MG capsule Commonly known as: PHOSLO Take 2 capsules (1,334 mg total) by mouth 3 (three) times daily with meals. What changed: how much to take   carvedilol 12.5 MG tablet Commonly known as: COREG Take 25 mg by mouth 2 (two) times daily.   cetirizine 5 MG tablet Commonly known as: ZYRTEC Take 1 tablet (5 mg total) by mouth 3 (three) times a week. Before dialysis   Insulin Pen Needle 31G X 5 MM Misc Commonly known as: Advocate Insulin Pen Needles Use four times daily to inject insulin   insulin regular 100 units/mL injection Commonly known as: NOVOLIN R Inject 6-8 Units into the skin See admin instructions. INJECT 6 UNITS UNDER THE SKIN AT BREAKFAST, 8 UNITS AT LUNCH AND 8 UNITS AT DINNER.   lactulose 10 GM/15ML solution Commonly known as: CHRONULAC Take 15 mLs (10 g total) by mouth 2 (two) times daily.   lidocaine-prilocaine cream Commonly known as: EMLA Apply 1 application topically Every Tuesday,Thursday,and Saturday with dialysis.   multivitamin with minerals tablet Take 1 tablet by mouth daily.   mupirocin ointment 2 % Commonly known as: BACTROBAN Apply 1 application topically 2 (two) times daily.   ondansetron 4 MG disintegrating tablet Commonly known as: Zofran ODT Take 1 tablet (4 mg total) by mouth 2 (two) times daily as needed for nausea or vomiting.   ONE TOUCH LANCETS Misc Use to test blood sugar three times daily   OneTouch Verio test strip Generic drug: glucose blood USE 1 STRIP TO CHECK GLUCOSE THREE TIMES DAILY. DX:E11.65   pantoprazole 40 MG tablet Commonly known as: PROTONIX Take 1 tablet (40 mg total) by mouth 2 (two) times daily. Replaces: pantoprazole 40 MG injection   rosuvastatin 5 MG tablet Commonly  known as: CRESTOR Take 1 tablet by mouth once daily   Systane 0.4-0.3 % Soln Generic drug: Polyethyl Glycol-Propyl Glycol Place 1 drop into both eyes 3 (three) times daily as needed (for dry eyes).   [name] 100 UNIT/ML FlexTouch Pen Generic drug: insulin degludec Inject 0.14 mLs (14 Units total) into the skin daily. Inject 14 units under the skin once daily. What changed:   how much to take  additional instructions      Allergies  Allergen Reactions  . Heparin Other (See Comments)    Patient is Muslim and is not permitted Pork derative due to religious belief)  . Pork-Derived Products Other (See Comments)    Patient does not eat pork due to religious beliefs      The results of significant diagnostics from this hospitalization (including imaging, microbiology, ancillary and laboratory) are listed below for reference.    Significant Diagnostic Studies: DG Chest 1 View  Result Date: 05/02/2020 CLINICAL DATA:  Cough and altered mental status EXAM: CHEST  1 VIEW COMPARISON:  04/19/2020 FINDINGS: Cardiac shadow is enlarged. Aortic calcifications are again noted. Vascular congestion is seen although improved from the prior exam likely related to a degree of volume overload. No focal confluent infiltrate is seen. No bony abnormality is noted. IMPRESSION: Vascular congestion likely related to fluid overload. Electronically Signed   By: Inez Catalina M.D.   On:  05/02/2020 13:38   DG Abdomen 1 View  Result Date: 04/16/2020 CLINICAL DATA:  Vomiting blood.  Recent dialysis EXAM: X-RAY ABDOMEN 1 VIEW COMPARISON:  None. FINDINGS: No dilated loops of large or small bowel. Gas and stool in the rectum. No pathologic calcifications. No organomegaly. No acute osseous abnormality IMPRESSION: No acute abdominal findings. Electronically Signed   By: Suzy Bouchard M.D.   On: 04/16/2020 04:33   CT HEAD WO CONTRAST  Result Date: 05/02/2020 CLINICAL DATA:  Mental status change EXAM: CT  HEAD WITHOUT CONTRAST TECHNIQUE: Contiguous axial images were obtained from the base of the skull through the vertex without intravenous contrast. COMPARISON:  None. FINDINGS: Brain: No evidence of acute large vascular territory infarction, hemorrhage, hydrocephalus, extra-axial collection or mass lesion/mass effect. Mild patchy white matter hypoattenuation, likely the sequela of chronic microvascular ischemic disease. Mild cerebral atrophy with ex vacuo ventricular dilation. Vascular: No hyperdense vessel identified. Calcific intracranial atherosclerosis. Skull: Normal. Negative for fracture or focal lesion. Sinuses/Orbits: No acute finding. Other: No mastoid effusions. IMPRESSION: No evidence of acute intracranial abnormality. Electronically Signed   By: Margaretha Sheffield MD   On: 05/02/2020 16:21   DG Chest Port 1 View  Result Date: 04/19/2020 CLINICAL DATA:  Abnormal respirations EXAM: PORTABLE CHEST 1 VIEW COMPARISON:  04/16/2020 FINDINGS: New bilateral airspace disease, right greater than left. Heart is borderline in size. Small right pleural effusion. No pneumothorax. IMPRESSION: New bilateral airspace disease, right greater than left. This could reflect asymmetric edema or infection. Small right effusion. Electronically Signed   By: Rolm Baptise M.D.   On: 04/19/2020 08:27   DG Chest Port 1 View  Result Date: 04/16/2020 CLINICAL DATA:  Questionable sepsis EXAM: PORTABLE CHEST 1 VIEW COMPARISON:  12/20/2019 FINDINGS: Normal mediastinum and cardiac silhouette. Normal pulmonary vasculature. No evidence of effusion, infiltrate, or pneumothorax. No acute bony abnormality. IMPRESSION: No acute cardiopulmonary process. Electronically Signed   By: Suzy Bouchard M.D.   On: 04/16/2020 04:31   CT Angio Abd/Pel w/ and/or w/o  Result Date: 04/17/2020 CLINICAL DATA:  Abdominal pain and distension. EXAM: CTA ABDOMEN AND PELVIS WITHOUT AND WITH CONTRAST TECHNIQUE: Multidetector CT imaging of the abdomen and  pelvis was performed using the standard protocol during bolus administration of intravenous contrast. Multiplanar reconstructed images and MIPs were obtained and reviewed to evaluate the vascular anatomy. CONTRAST:  174m OMNIPAQUE IOHEXOL 350 MG/ML SOLN COMPARISON:  May 24, 2019 FINDINGS: VASCULAR Aorta: Normal caliber aorta without aneurysm, dissection, vasculitis or significant stenosis. Celiac: Patent without evidence of aneurysm, dissection, vasculitis or significant stenosis. SMA: Patent without evidence of aneurysm, dissection, vasculitis or significant stenosis. Renals: Both renal arteries are patent without evidence of aneurysm, dissection, vasculitis, fibromuscular dysplasia or significant stenosis. IMA: Patent without evidence of aneurysm, dissection, vasculitis or significant stenosis. Inflow: Patent without evidence of aneurysm, dissection, vasculitis or significant stenosis. Proximal Outflow: Bilateral common femoral and visualized portions of the superficial and profunda femoral arteries are patent without evidence of aneurysm, dissection, vasculitis or significant stenosis. Veins: No obvious venous abnormality within the limitations of this arterial phase study. Review of the MIP images confirms the above findings. NON-VASCULAR Lower chest: A stable 6 mm noncalcified lung nodule is seen within the anterolateral aspect of the right lower lobe. Small bilateral pleural effusions are noted. Hepatobiliary: The liver is cirrhotic in appearance. No focal liver abnormality is seen. No gallstones, gallbladder wall thickening, or biliary dilatation. Pancreas: Unremarkable. No pancreatic ductal dilatation or surrounding inflammatory changes. Spleen: The spleen is moderately  enlarged with multiple dilated tortuous vessels seen along the splenic hilum. Adrenals/Urinary Tract: Adrenal glands are unremarkable. Kidneys are small in size, without renal calculi, focal lesion, or hydronephrosis. Bladder is  unremarkable. Stomach/Bowel: Stomach is within normal limits. Appendix appears normal. No evidence of bowel dilatation. Mild thickening of the mid and distal sigmoid colon is seen. Lymphatic: No abnormal abdominal or pelvic lymph nodes are identified. Reproductive: The prostate gland is markedly enlarged. Other: No abdominal wall hernia or abnormality. A very small amount of abdominal free fluid is seen. Musculoskeletal: No acute or significant osseous findings. IMPRESSION: 1. Cirrhosis with findings consistent with portal hypertension. 2. Mild thickening of the mid and distal sigmoid colon, which may represent mild colitis. 3. Small bilateral pleural effusions. 4. Stable 6 mm noncalcified lung nodule within the anterolateral aspect of the right lower lobe. Correlation with 12 month follow-up chest CT is recommended to determine stability. 5. Markedly enlarged prostate gland. 6. Very small amount of abdominal free fluid. 7. No evidence of aneurysmal dilatation, dissection, vasculitis or significant stenosis within the arterial structures of the abdomen and pelvis. Electronically Signed   By: Virgina Norfolk M.D.   On: 04/17/2020 20:38    Microbiology: Recent Results (from the past 240 hour(s))  Respiratory Panel by RT PCR (Flu A&B, Covid) - Nasopharyngeal Swab     Status: None   Collection Time: 05/02/20  4:45 PM   Specimen: Nasopharyngeal Swab  Result Value Ref Range Status   SARS Coronavirus 2 by RT PCR NEGATIVE NEGATIVE Final    Comment: (NOTE) SARS-CoV-2 target nucleic acids are NOT DETECTED.  The SARS-CoV-2 RNA is generally detectable in upper respiratoy specimens during the acute phase of infection. The lowest concentration of SARS-CoV-2 viral copies this assay can detect is 131 copies/mL. A negative result does not preclude SARS-Cov-2 infection and should not be used as the sole basis for treatment or other patient management decisions. A negative result may occur with  improper specimen  collection/handling, submission of specimen other than nasopharyngeal swab, presence of viral mutation(s) within the areas targeted by this assay, and inadequate number of viral copies (<131 copies/mL). A negative result must be combined with clinical observations, patient history, and epidemiological information. The expected result is Negative.  Fact Sheet for Patients:  PinkCheek.be  Fact Sheet for Healthcare Providers:  GravelBags.it  This test is no t yet approved or cleared by the [place] FDA and  has been authorized for detection and/or diagnosis of SARS-CoV-2 by FDA under an Emergency Use Authorization (EUA). This EUA will remain  in effect (meaning this test can be used) for the duration of the COVID-19 declaration under Section 564(b)(1) of the Act, 21 U.S.C. section 360bbb-3(b)(1), unless the authorization is terminated or revoked sooner.     Influenza A by PCR NEGATIVE NEGATIVE Final   Influenza B by PCR NEGATIVE NEGATIVE Final    Comment: (NOTE) The Xpert Xpress SARS-CoV-2/FLU/RSV assay is intended as an aid in  the diagnosis of influenza from Nasopharyngeal swab specimens and  should not be used as a sole basis for treatment. Nasal washings and  aspirates are unacceptable for Xpert Xpress SARS-CoV-2/FLU/RSV  testing.  Fact Sheet for Patients: PinkCheek.be  Fact Sheet for Healthcare Providers: GravelBags.it  This test is not yet approved or cleared by the [place] FDA and  has been authorized for detection and/or diagnosis of SARS-CoV-2 by  FDA under an Emergency Use Authorization (EUA). This EUA will remain  in effect (meaning this test can  be used) for the duration of the  Covid-19 declaration under Section 564(b)(1) of the Act, 21  U.S.C. section 360bbb-3(b)(1), unless the authorization is  terminated or revoked. Performed at [facility], [place] [address] [place], [place] [address]      Labs: Basic Metabolic Panel: Recent Labs  Lab 05/01/20 1834 05/02/20 1208 05/03/20 0732 05/04/20 0841  NA 137 136 137 139  K 4.2 4.4 4.2 3.8  CL 102 100 100 102  CO2 23 22 23 24   GLUCOSE 281* 205* 310* 199*  BUN 31* 17 26* 31*  CREATININE 6.38* 3.90* 5.58* 7.31*  CALCIUM 8.9 9.3 8.8* 9.0  PHOS  --   --   --  3.7   Liver Function Tests: Recent Labs  Lab 05/02/20 1208 05/03/20 0732 05/04/20 0841  AST 34 27 27  ALT 23 22 20   ALKPHOS 100 85 93  BILITOT 2.0* 2.0* 2.0*  PROT 6.3* 6.0* 6.4*  ALBUMIN 2.4* 2.3* 2.4*   No results for input(s): LIPASE, AMYLASE in the last 168 hours. Recent Labs  Lab 05/02/20 1208 05/03/20 0732  AMMONIA 67* 40*   CBC: Recent Labs  Lab 05/01/20 1834 05/02/20 1208 05/03/20 0732 05/04/20 0841  WBC 3.8* 4.6 3.5* 3.8*  HGB 9.7* 9.8* 8.9* 9.4*  HCT 31.0* 30.0* 27.9* 29.8*  MCV 84.7 83.8 84.8 84.7  PLT 77* 57* 60* 68*   Cardiac Enzymes: No results for input(s): CKTOTAL, CKMB, CKMBINDEX, TROPONINI in the last 168 hours. BNP: BNP (last 3 results) No results for input(s): BNP in the last 8760 hours.  ProBNP (last 3 results) No results for input(s): PROBNP in the last 8760 hours.  CBG: Recent Labs  Lab 05/03/20 1127 05/03/20 1629 05/03/20 2156 05/04/20 0651 05/04/20 1149  GLUCAP 277* 282* 215* 174* 239*       Signed:  Domenic Polite MD.  [facility] 05/04/2020, 2:07 PM

## 2020-05-04 NOTE — Plan of Care (Signed)
  Problem: Safety: Goal: Ability to remain free from injury will improve Outcome: Progressing   

## 2020-05-04 NOTE — Progress Notes (Signed)
[facility] Progress Note  Name: William [name] MRN: [id number] DOB: [birth year]  Chief Complaint:  ams  Subjective:  Last HD outpt prior to admission without UF on 9/28.  Spoke with his son at bedside this AM who states that his dad's mental status is much improved - he confirmed lactulose is a new med.  Review of systems:  Denies shortness of breath or chest pain  Denies n/v Has had diarrhea after the lactulose.    Intake/Output Summary (Last 24 hours) at 05/04/2020 1029 Last data filed at 05/04/2020 0600 Gross per 24 hour  Intake 0 ml  Output 0 ml  Net 0 ml    Vitals:  Vitals:   05/03/20 1626 05/03/20 2154 05/04/20 0527 05/04/20 1005  BP: (!) 148/66 (!) 126/46 (!) 142/55 (!) 131/53  Pulse: 77 80 77 71  Resp: 18 18 17 18   Temp: 99.6 F (37.6 C) 99.5 F (37.5 C) 98.9 F (37.2 C) 98.7 F (37.1 C)  TempSrc: Oral Oral Oral Oral  SpO2: 96% 99% 96% 99%  Weight:  66.7 kg       Physical Exam:  General: adult male in bed in NAD    HEENT: NCAT Eyes: EOMI  Neck: supple trachea midline Heart: S1S2 no rub Lungs: clear to auscultation and unlabored on room air Abdomen:soft/nd/nt Extremities: no edema appreciated Skin: no rash on extremities exposed Neuro: awake and alert; oriented to person, year, and location Access: LUE AVF bruit and thrill   Medications reviewed   Labs:  BMP Latest Ref Rng & Units 05/03/2020 05/02/2020 05/01/2020  Glucose 70 - 99 mg/dL 310(H) 205(H) 281(H)  BUN 8 - 23 mg/dL 26(H) 17 31(H)  Creatinine 0.61 - 1.24 mg/dL 5.58(H) 3.90(H) 6.38(H)  BUN/Creat Ratio 10 - 24 - - -  Sodium 135 - 145 mmol/L 137 136 137  Potassium 3.5 - 5.1 mmol/L 4.2 4.4 4.2  Chloride 98 - 111 mmol/L 100 100 102  CO2 22 - 32 mmol/L 23 22 23   Calcium 8.9 - 10.3 mg/dL 8.8(L) 9.3 8.9    [facility]  BF 400 DF 800  EDW 68 kg  2K /3.5 Ca bath AVF No heparin  Not on activated vit D venofer 50 mg weekly with HD mircera 75 mcg every 2  weeks with HD - hasn't yet received this dose Last tx on 9/28 - 2 hrs and 12 min; had post weight of 66.3 kg and had no UF that tx; left at 67.1 kg the tx before that Last mircera outpt was 04/11/20 at 60 mcg  Assessment/Plan:  # Altered mental status - Felt secondary to hepatic encephalopathy elevated ammonia level was noted.  Management per primary team  # Cirrhosis with hepatic encephalopathy - Team has initiated lactulose.  Patient follows with [facility] and is per report on transplant list  # End-stage renal disease on hemodialysis Tuesday Thursday Saturday.  Unable to accommodate at outpatient unit today and he will get HD here  # Chronic diastolic CHF optimize volume with HD  # HTN  - optimize volume with HD; cont current regimen   # Anemia of CKD - for aranesp 9/30    # Secondary hyperparathyroidism   - on phoslo. await phos   Disposition per primary team.  Stable from a renal standpoint  Claudia Desanctis, MD 05/04/2020 10:29 AM

## 2020-05-05 ENCOUNTER — Telehealth: Payer: Self-pay | Admitting: Physician Assistant

## 2020-05-05 DIAGNOSIS — N186 End stage renal disease: Secondary | ICD-10-CM | POA: Diagnosis not present

## 2020-05-05 DIAGNOSIS — E1122 Type 2 diabetes mellitus with diabetic chronic kidney disease: Secondary | ICD-10-CM | POA: Diagnosis not present

## 2020-05-05 DIAGNOSIS — Z992 Dependence on renal dialysis: Secondary | ICD-10-CM | POA: Diagnosis not present

## 2020-05-05 NOTE — Telephone Encounter (Signed)
Transition of care contact from inpatient facility  Date of discharge: 05/04/20 Date of contact: 05/05/20 Method: Phone Spoke to: Patient's son, [name]  Patient contacted to discuss transition of care from recent inpatient hospitalization. Patient was admitted to [facility] from 05/02/20-10/1/1 with discharge diagnosis of hepatic encephalopathy.  Medication changes were reviewed. Patient's son has already picked up the lactulose. Son assists in patient's care and denies any other needs at this time.  Patient will follow up with his/her outpatient HD unit on: 05/06/20  Anice Paganini, PA-C 05/05/2020, 11:17 AM  [employer]

## 2020-05-06 DIAGNOSIS — D631 Anemia in chronic kidney disease: Secondary | ICD-10-CM | POA: Diagnosis not present

## 2020-05-06 DIAGNOSIS — N186 End stage renal disease: Secondary | ICD-10-CM | POA: Diagnosis not present

## 2020-05-06 DIAGNOSIS — N2581 Secondary hyperparathyroidism of renal origin: Secondary | ICD-10-CM | POA: Diagnosis not present

## 2020-05-06 DIAGNOSIS — Z992 Dependence on renal dialysis: Secondary | ICD-10-CM | POA: Diagnosis not present

## 2020-05-06 DIAGNOSIS — D509 Iron deficiency anemia, unspecified: Secondary | ICD-10-CM | POA: Diagnosis not present

## 2020-05-08 ENCOUNTER — Inpatient Hospital Stay (HOSPITAL_COMMUNITY)
Admission: EM | Admit: 2020-05-08 | Discharge: 2020-05-11 | DRG: 441 | Disposition: A | Discharge status: Home Health | Payer: Medicare Other | Attending: Internal Medicine | Admitting: Internal Medicine

## 2020-05-08 ENCOUNTER — Emergency Department (HOSPITAL_COMMUNITY): Payer: Medicare Other

## 2020-05-08 ENCOUNTER — Encounter (HOSPITAL_COMMUNITY): Payer: Self-pay | Admitting: Internal Medicine

## 2020-05-08 ENCOUNTER — Other Ambulatory Visit: Payer: Self-pay

## 2020-05-08 DIAGNOSIS — Z794 Long term (current) use of insulin: Secondary | ICD-10-CM

## 2020-05-08 DIAGNOSIS — Z833 Family history of diabetes mellitus: Secondary | ICD-10-CM | POA: Diagnosis not present

## 2020-05-08 DIAGNOSIS — I132 Hypertensive heart and chronic kidney disease with heart failure and with stage 5 chronic kidney disease, or end stage renal disease: Secondary | ICD-10-CM | POA: Diagnosis present

## 2020-05-08 DIAGNOSIS — I10 Essential (primary) hypertension: Secondary | ICD-10-CM | POA: Diagnosis not present

## 2020-05-08 DIAGNOSIS — K7682 Hepatic encephalopathy: Secondary | ICD-10-CM

## 2020-05-08 DIAGNOSIS — H5440 Blindness, one eye, unspecified eye: Secondary | ICD-10-CM | POA: Diagnosis not present

## 2020-05-08 DIAGNOSIS — Z20822 Contact with and (suspected) exposure to covid-19: Secondary | ICD-10-CM | POA: Diagnosis not present

## 2020-05-08 DIAGNOSIS — R0600 Dyspnea, unspecified: Secondary | ICD-10-CM | POA: Diagnosis not present

## 2020-05-08 DIAGNOSIS — R410 Disorientation, unspecified: Secondary | ICD-10-CM | POA: Diagnosis not present

## 2020-05-08 DIAGNOSIS — Z8249 Family history of ischemic heart disease and other diseases of the circulatory system: Secondary | ICD-10-CM

## 2020-05-08 DIAGNOSIS — E1122 Type 2 diabetes mellitus with diabetic chronic kidney disease: Secondary | ICD-10-CM | POA: Diagnosis not present

## 2020-05-08 DIAGNOSIS — D631 Anemia in chronic kidney disease: Secondary | ICD-10-CM | POA: Diagnosis not present

## 2020-05-08 DIAGNOSIS — Z992 Dependence on renal dialysis: Secondary | ICD-10-CM

## 2020-05-08 DIAGNOSIS — E1165 Type 2 diabetes mellitus with hyperglycemia: Secondary | ICD-10-CM | POA: Diagnosis not present

## 2020-05-08 DIAGNOSIS — Z7682 Awaiting organ transplant status: Secondary | ICD-10-CM

## 2020-05-08 DIAGNOSIS — Z79899 Other long term (current) drug therapy: Secondary | ICD-10-CM | POA: Diagnosis not present

## 2020-05-08 DIAGNOSIS — I959 Hypotension, unspecified: Secondary | ICD-10-CM | POA: Diagnosis not present

## 2020-05-08 DIAGNOSIS — J439 Emphysema, unspecified: Secondary | ICD-10-CM | POA: Diagnosis not present

## 2020-05-08 DIAGNOSIS — I2510 Atherosclerotic heart disease of native coronary artery without angina pectoris: Secondary | ICD-10-CM | POA: Diagnosis not present

## 2020-05-08 DIAGNOSIS — Z888 Allergy status to other drugs, medicaments and biological substances status: Secondary | ICD-10-CM

## 2020-05-08 DIAGNOSIS — J810 Acute pulmonary edema: Secondary | ICD-10-CM | POA: Diagnosis not present

## 2020-05-08 DIAGNOSIS — D696 Thrombocytopenia, unspecified: Secondary | ICD-10-CM | POA: Diagnosis not present

## 2020-05-08 DIAGNOSIS — R188 Other ascites: Secondary | ICD-10-CM | POA: Diagnosis present

## 2020-05-08 DIAGNOSIS — K7460 Unspecified cirrhosis of liver: Secondary | ICD-10-CM | POA: Diagnosis not present

## 2020-05-08 DIAGNOSIS — E1121 Type 2 diabetes mellitus with diabetic nephropathy: Secondary | ICD-10-CM

## 2020-05-08 DIAGNOSIS — K7290 Hepatic failure, unspecified without coma: Principal | ICD-10-CM | POA: Diagnosis present

## 2020-05-08 DIAGNOSIS — K219 Gastro-esophageal reflux disease without esophagitis: Secondary | ICD-10-CM | POA: Diagnosis present

## 2020-05-08 DIAGNOSIS — I700 Atherosclerosis of aorta: Secondary | ICD-10-CM | POA: Diagnosis not present

## 2020-05-08 DIAGNOSIS — M310 Hypersensitivity angiitis: Secondary | ICD-10-CM | POA: Diagnosis not present

## 2020-05-08 DIAGNOSIS — R404 Transient alteration of awareness: Secondary | ICD-10-CM | POA: Diagnosis not present

## 2020-05-08 DIAGNOSIS — Z823 Family history of stroke: Secondary | ICD-10-CM | POA: Diagnosis not present

## 2020-05-08 DIAGNOSIS — E1129 Type 2 diabetes mellitus with other diabetic kidney complication: Secondary | ICD-10-CM | POA: Diagnosis present

## 2020-05-08 DIAGNOSIS — I517 Cardiomegaly: Secondary | ICD-10-CM | POA: Diagnosis not present

## 2020-05-08 DIAGNOSIS — I5033 Acute on chronic diastolic (congestive) heart failure: Secondary | ICD-10-CM | POA: Diagnosis not present

## 2020-05-08 DIAGNOSIS — N186 End stage renal disease: Secondary | ICD-10-CM

## 2020-05-08 DIAGNOSIS — I5032 Chronic diastolic (congestive) heart failure: Secondary | ICD-10-CM | POA: Diagnosis not present

## 2020-05-08 DIAGNOSIS — R4182 Altered mental status, unspecified: Secondary | ICD-10-CM | POA: Diagnosis not present

## 2020-05-08 DIAGNOSIS — I120 Hypertensive chronic kidney disease with stage 5 chronic kidney disease or end stage renal disease: Secondary | ICD-10-CM | POA: Diagnosis not present

## 2020-05-08 HISTORY — DX: End stage renal disease: Z99.2

## 2020-05-08 HISTORY — DX: End stage renal disease: N18.6

## 2020-05-08 LAB — COMPREHENSIVE METABOLIC PANEL
ALT: 15 U/L (ref 0–44)
AST: 30 U/L (ref 15–41)
Albumin: 2.5 g/dL — ABNORMAL LOW (ref 3.5–5.0)
Alkaline Phosphatase: 103 U/L (ref 38–126)
Anion gap: 12 (ref 5–15)
BUN: 23 mg/dL (ref 8–23)
CO2: 24 mmol/L (ref 22–32)
Calcium: 9.6 mg/dL (ref 8.9–10.3)
Chloride: 101 mmol/L (ref 98–111)
Creatinine, Ser: 5.83 mg/dL — ABNORMAL HIGH (ref 0.61–1.24)
GFR calc Af Amer: 11 mL/min — ABNORMAL LOW (ref >60)
GFR calc non Af Amer: 9 mL/min — ABNORMAL LOW (ref >60)
Glucose, Bld: 176 mg/dL — ABNORMAL HIGH (ref 70–99)
Potassium: 3.8 mmol/L (ref 3.5–5.1)
Sodium: 137 mmol/L (ref 135–145)
Total Bilirubin: 1.8 mg/dL — ABNORMAL HIGH (ref 0.3–1.2)
Total Protein: 6.8 g/dL (ref 6.5–8.1)

## 2020-05-08 LAB — CBC WITH DIFFERENTIAL/PLATELET
Abs Immature Granulocytes: 0.01 10*3/uL (ref 0.00–0.07)
Basophils Absolute: 0 10*3/uL (ref 0.0–0.1)
Basophils Relative: 1 %
Eosinophils Absolute: 0.2 10*3/uL (ref 0.0–0.5)
Eosinophils Relative: 4 %
HCT: 32.7 % — ABNORMAL LOW (ref 39.0–52.0)
Hemoglobin: 10.5 g/dL — ABNORMAL LOW (ref 13.0–17.0)
Immature Granulocytes: 0 %
Lymphocytes Relative: 16 %
Lymphs Abs: 0.7 10*3/uL (ref 0.7–4.0)
MCH: 27.6 pg (ref 26.0–34.0)
MCHC: 32.1 g/dL (ref 30.0–36.0)
MCV: 86.1 fL (ref 80.0–100.0)
Monocytes Absolute: 0.8 10*3/uL (ref 0.1–1.0)
Monocytes Relative: 18 %
Neutro Abs: 2.6 10*3/uL (ref 1.7–7.7)
Neutrophils Relative %: 61 %
Platelets: 67 10*3/uL — ABNORMAL LOW (ref 150–400)
RBC: 3.8 MIL/uL — ABNORMAL LOW (ref 4.22–5.81)
RDW: 17.5 % — ABNORMAL HIGH (ref 11.5–15.5)
WBC: 4.2 10*3/uL (ref 4.0–10.5)
nRBC: 0 % (ref 0.0–0.2)

## 2020-05-08 LAB — I-STAT VENOUS BLOOD GAS, ED
Acid-Base Excess: 8 mmol/L — ABNORMAL HIGH (ref 0.0–2.0)
Bicarbonate: 26.8 mmol/L (ref 20.0–28.0)
Calcium, Ion: 0.93 mmol/L — ABNORMAL LOW (ref 1.15–1.40)
HCT: 28 % — ABNORMAL LOW (ref 39.0–52.0)
Hemoglobin: 9.5 g/dL — ABNORMAL LOW (ref 13.0–17.0)
O2 Saturation: 100 %
Potassium: 4.3 mmol/L (ref 3.5–5.1)
Sodium: 136 mmol/L (ref 135–145)
TCO2: 27 mmol/L (ref 22–32)
pCO2, Ven: 21 mmHg — ABNORMAL LOW (ref 44.0–60.0)
pH, Ven: 7.715 (ref 7.250–7.430)
pO2, Ven: 176 mmHg — ABNORMAL HIGH (ref 32.0–45.0)

## 2020-05-08 LAB — LACTIC ACID, PLASMA: Lactic Acid, Venous: 2.5 mmol/L (ref 0.5–1.9)

## 2020-05-08 LAB — HEPATITIS B SURFACE ANTIGEN: Hepatitis B Surface Ag: NONREACTIVE

## 2020-05-08 LAB — CBG MONITORING, ED
Glucose-Capillary: 169 mg/dL — ABNORMAL HIGH (ref 70–99)
Glucose-Capillary: 225 mg/dL — ABNORMAL HIGH (ref 70–99)
Glucose-Capillary: 186 mg/dL — ABNORMAL HIGH (ref 70–99)

## 2020-05-08 LAB — RESPIRATORY PANEL BY RT PCR (FLU A&B, COVID)
Influenza A by PCR: NEGATIVE
Influenza B by PCR: NEGATIVE
SARS Coronavirus 2 by RT PCR: NEGATIVE

## 2020-05-08 LAB — PROTIME-INR
INR: 1.2 (ref 0.8–1.2)
Prothrombin Time: 14.8 seconds (ref 11.4–15.2)

## 2020-05-08 LAB — GLUCOSE, CAPILLARY: Glucose-Capillary: 160 mg/dL — ABNORMAL HIGH (ref 70–99)

## 2020-05-08 LAB — AMMONIA: Ammonia: 119 umol/L — ABNORMAL HIGH (ref 9–35)

## 2020-05-08 LAB — ETHANOL: Alcohol, Ethyl (B): <10 mg/dL (ref <10)

## 2020-05-08 MED ORDER — ROSUVASTATIN CALCIUM 5 MG PO TABS
5.0000 mg | ORAL_TABLET | Freq: Every day | ORAL | Status: DC
  Administered 2020-05-08 – 2020-05-11 (×4): 5 mg via ORAL
  Filled 2020-05-08 (×4): qty 1

## 2020-05-08 MED ORDER — AMLODIPINE BESYLATE 5 MG PO TABS
5.0000 mg | ORAL_TABLET | Freq: Every day | ORAL | Status: DC
  Administered 2020-05-08 – 2020-05-10 (×3): 5 mg via ORAL
  Filled 2020-05-08 (×3): qty 1

## 2020-05-08 MED ORDER — INSULIN DEGLUDEC 100 UNIT/ML ~~LOC~~ SOPN: 10.0000 [IU] | PEN_INJECTOR | Freq: Every day | SUBCUTANEOUS | Status: DC

## 2020-05-08 MED ORDER — ONDANSETRON HCL 4 MG PO TABS: 4.0000 mg | ORAL_TABLET | Freq: Four times a day (QID) | ORAL | Status: DC | PRN

## 2020-05-08 MED ORDER — RIFAXIMIN 550 MG PO TABS
550.0000 mg | ORAL_TABLET | Freq: Every day | ORAL | Status: DC
  Administered 2020-05-08 – 2020-05-11 (×4): 550 mg via ORAL
  Filled 2020-05-08 (×5): qty 1

## 2020-05-08 MED ORDER — ONDANSETRON HCL 4 MG/2ML IJ SOLN
4.0000 mg | Freq: Four times a day (QID) | INTRAMUSCULAR | Status: DC | PRN
  Administered 2020-05-10: 4 mg via INTRAVENOUS
  Filled 2020-05-08: qty 2

## 2020-05-08 MED ORDER — POLYVINYL ALCOHOL 1.4 % OP SOLN: 1.0000 [drp] | Freq: Three times a day (TID) | OPHTHALMIC | Status: DC | PRN

## 2020-05-08 MED ORDER — INSULIN ASPART 100 UNIT/ML ~~LOC~~ SOLN: 0.0000 [IU] | SUBCUTANEOUS | Status: DC

## 2020-05-08 MED ORDER — LACTULOSE ENEMA
300.0000 mL | Freq: Once | ORAL | Status: AC
  Administered 2020-05-08: 300 mL via RECTAL
  Filled 2020-05-08: qty 300

## 2020-05-08 MED ORDER — LACTULOSE 10 GM/15ML PO SOLN
30.0000 g | Freq: Once | ORAL | Status: AC
  Administered 2020-05-08: 30 g via ORAL
  Filled 2020-05-08: qty 45

## 2020-05-08 MED ORDER — ACETAMINOPHEN 325 MG PO TABS: 650.0000 mg | ORAL_TABLET | Freq: Four times a day (QID) | ORAL | Status: DC | PRN

## 2020-05-08 MED ORDER — ACETAMINOPHEN 650 MG RE SUPP: 650.0000 mg | Freq: Four times a day (QID) | RECTAL | Status: DC | PRN

## 2020-05-08 MED ORDER — LACTULOSE 10 GM/15ML PO SOLN
30.0000 g | Freq: Three times a day (TID) | ORAL | Status: DC
  Administered 2020-05-08 – 2020-05-11 (×8): 30 g via ORAL
  Filled 2020-05-08 (×12): qty 45

## 2020-05-08 MED ORDER — INSULIN ASPART 100 UNIT/ML ~~LOC~~ SOLN
0.0000 [IU] | Freq: Three times a day (TID) | SUBCUTANEOUS | Status: DC
  Administered 2020-05-08: 3 [IU] via SUBCUTANEOUS
  Administered 2020-05-08 – 2020-05-09 (×2): 2 [IU] via SUBCUTANEOUS
  Administered 2020-05-10: 9 [IU] via SUBCUTANEOUS
  Administered 2020-05-11: 1 [IU] via SUBCUTANEOUS

## 2020-05-08 MED ORDER — PANTOPRAZOLE SODIUM 40 MG PO TBEC
40.0000 mg | DELAYED_RELEASE_TABLET | Freq: Two times a day (BID) | ORAL | Status: DC
  Administered 2020-05-08 – 2020-05-11 (×6): 40 mg via ORAL
  Filled 2020-05-08 (×3): qty 1
  Filled 2020-05-08: qty 2
  Filled 2020-05-08 (×4): qty 1

## 2020-05-08 MED ORDER — ADULT MULTIVITAMIN W/MINERALS CH
1.0000 | ORAL_TABLET | Freq: Every day | ORAL | Status: DC
  Administered 2020-05-09 – 2020-05-11 (×3): 1 via ORAL
  Filled 2020-05-08 (×4): qty 1

## 2020-05-08 MED ORDER — INSULIN GLARGINE 100 UNIT/ML ~~LOC~~ SOLN
10.0000 [IU] | Freq: Every day | SUBCUTANEOUS | Status: DC
  Administered 2020-05-08 – 2020-05-11 (×4): 10 [IU] via SUBCUTANEOUS
  Filled 2020-05-08 (×4): qty 0.1

## 2020-05-08 MED ORDER — CALCIUM ACETATE (PHOS BINDER) 667 MG PO CAPS
667.0000 mg | ORAL_CAPSULE | Freq: Three times a day (TID) | ORAL | Status: DC
  Administered 2020-05-08 – 2020-05-11 (×6): 667 mg via ORAL
  Filled 2020-05-08 (×6): qty 1

## 2020-05-08 MED ORDER — CHLORHEXIDINE GLUCONATE CLOTH 2 % EX PADS: 6.0000 | MEDICATED_PAD | Freq: Every day | CUTANEOUS | Status: DC

## 2020-05-08 MED ORDER — CARVEDILOL 25 MG PO TABS
25.0000 mg | ORAL_TABLET | Freq: Two times a day (BID) | ORAL | Status: DC
  Administered 2020-05-08 – 2020-05-10 (×5): 25 mg via ORAL
  Filled 2020-05-08 (×2): qty 1
  Filled 2020-05-08: qty 8
  Filled 2020-05-08 (×2): qty 1

## 2020-05-08 NOTE — ED Notes (Signed)
Lf arm restriction.

## 2020-05-08 NOTE — Progress Notes (Signed)
NEW ADMISSION NOTE New Admission Note:   Arrival Method: BED Mental Orientation: Oriented to person and place, disoriented to time and situation Telemetry: BOX 09 Assessment: Completed Skin: Intact IV: Rt. hand Pain: 0/10 Tubes: n/a Safety Measures: Safety Fall Prevention Plan has been given, discussed and signed Admission: Completed [facility] Orientation: Patient has been orientated to the room, unit and staff.  Family: Son is at bedside.  Orders have been reviewed and implemented. Will continue to monitor the patient. Call light has been placed within reach and bed alarm has been activated.   Vira Agar, RN

## 2020-05-08 NOTE — ED Triage Notes (Signed)
Pt arrives to ED BIB [facility] presenting with AMS. Per EMS pt's family stated that pt started to become altered yesterday around 1400, alert but not oriented to self, place or situation. Per EMS pt was at [facility] x1 week ago for same and had a TIPS procedure done. Hx of Cirrhosis and Dialysis. T TH SA. Received dialysis yesterday (Saturday). Pt only speaks arabic. EDP at bedside upon pt's arrival.  BP 152/56 HR 62  100% RA CBG 210

## 2020-05-08 NOTE — ED Provider Notes (Signed)
[name] EMERGENCY DEPARTMENT Provider Note   CSN: [id number] Arrival date & time: 05/08/20  0031     History Chief Complaint  Patient presents with  . Altered Mental Status    William [name] is a 69 y.o. male.  69 yo M with a chief complaints of altered mental status.  The patient was recently in the hospital for the same.  Was diagnosed with hepatic encephalopathy.  Has cirrhosis and is on the transplant list at [facility].  Patient started acting confused yesterday afternoon and had worsened throughout the day and into the night and was not sleeping and so EMS was called.  No known infectious symptoms from the family.  They have been giving him the lactulose at home.  Gave him an extra dose today without improvement.  Patient is altered and unable to provide further history.  Level 5 caveat.  The history is provided by the patient.  Illness Severity:  Moderate Onset quality:  Gradual Duration:  12 hours Timing:  Constant Progression:  Unchanged Chronicity:  Recurrent Associated symptoms: no abdominal pain, no chest pain, no congestion, no diarrhea, no fever, no headaches, no myalgias, no rash, no shortness of breath and no vomiting        Past Medical History:  Diagnosis Date  . Anemia   . Blind one eye   . CHF (congestive heart failure) ([place])   . Cirrhosis ([place])   . Cirrhosis ([place])   . CKD (chronic kidney disease), stage IV ([place])   . Dialysis patient ([place])   . Goodpasture's disease [facility])    on outpatient plasmapheresis/notes 11/20/2017  . Grade I diastolic dysfunction 97/35/3299  . History of anemia due to chronic kidney disease   . History of blood transfusion X 1   "UGIB; low blood count"  . History of plasmapheresis    "qod" (11/20/2017)  . Hypertension   . Pancytopenia ([place]) 08/20/2017  . Type II diabetes mellitus [facility])     Patient Active Problem List   Diagnosis Date Noted  . Goals of care, counseling/discussion   . Palliative care by  specialist   . Hepatic encephalopathy ([place]) 05/02/2020  . GIB (gastrointestinal bleeding) 04/16/2020  . Hyperkalemia, diminished renal excretion 06/01/2019  . AV fistula thrombosis, initial encounter ([place]) 06/01/2019  . AV fistula occlusion, initial encounter ([place])   . Chronic systolic CHF (congestive heart failure) ([place])   . Encounter for other preprocedural examination 09/23/2018  . Esophageal varices ([place]) 09/23/2018  . Thrombocytopenia ([place]) 09/23/2018  . CHF (congestive heart failure) ([place]) 09/22/2018  . ESRD on dialysis ([place]) 02/06/2018  . CKD (chronic kidney disease) stage 4, GFR 15-29 ml/min (HCC) 12/19/2017  . Cirrhosis of liver with ascites ([place]) 12/09/2017  . Goodpasture's syndrome ([place]) with associated hemoptysis 11/20/2017  . Acute on chronic diastolic CHF (congestive heart failure) ([place]) 11/06/2017  . Pancytopenia ([place]) 11/06/2017  . GERD (gastroesophageal reflux disease) 11/06/2017  . Essential hypertension 11/06/2017  . Type II diabetes mellitus with renal manifestations ([place]) 08/25/2015    Past Surgical History:  Procedure Laterality Date  . AV FISTULA PLACEMENT Left 12/26/2017   Procedure: LEFT BRACHIOCEPHALIC ARTERIOVENOUS (AV) FISTULA CREATION;  Surgeon: Conrad Hanna, MD;  Location: [place];  Service: Vascular;  Laterality: Left;  . BASCILIC VEIN TRANSPOSITION Left 02/16/2018   Procedure: BRACHIOBASILIC VEIN TRANSPOSITION SECOND STAGE;  Surgeon: Conrad , MD;  Location: [facility];  Service: Vascular;  Laterality: Left;  . CATARACT EXTRACTION W/ INTRAOCULAR LENS  IMPLANT, BILATERAL Bilateral   .  ESOPHAGEAL BANDING N/A 04/01/2018   Procedure: ESOPHAGEAL BANDING;  Surgeon: Otis Brace, MD;  [facility];  Service: Gastroenterology;  Laterality: N/A;  . ESOPHAGEAL BANDING N/A 06/29/2018   Procedure: ESOPHAGEAL BANDING;  Surgeon: Otis Brace, MD;  Location: [facility];  Service: Gastroenterology;  Laterality: N/A;  . ESOPHAGOGASTRODUODENOSCOPY  (EGD) WITH PROPOFOL N/A 04/01/2018   Procedure: ESOPHAGOGASTRODUODENOSCOPY (EGD) WITH PROPOFOL;  Surgeon: Otis Brace, MD;  [facility];  Service: Gastroenterology;  Laterality: N/A;  . ESOPHAGOGASTRODUODENOSCOPY (EGD) WITH PROPOFOL N/A 06/29/2018   Procedure: ESOPHAGOGASTRODUODENOSCOPY (EGD) WITH PROPOFOL;  Surgeon: Otis Brace, MD;  [facility];  Service: Gastroenterology;  Laterality: N/A;  . ESOPHAGOGASTRODUODENOSCOPY (EGD) WITH PROPOFOL N/A 09/30/2018   Procedure: ESOPHAGOGASTRODUODENOSCOPY (EGD) WITH PROPOFOL;  Surgeon: Otis Brace, MD;  [facility];  Service: Gastroenterology;  Laterality: N/A;  . ESOPHAGOGASTRODUODENOSCOPY (EGD) WITH PROPOFOL N/A 04/20/2019   Procedure: ESOPHAGOGASTRODUODENOSCOPY (EGD) WITH PROPOFOL;  Surgeon: Otis Brace, MD;  [facility];  Service: Gastroenterology;  Laterality: N/A;  . ESOPHAGOGASTRODUODENOSCOPY (EGD) WITH PROPOFOL N/A 04/17/2020   Procedure: ESOPHAGOGASTRODUODENOSCOPY (EGD) WITH PROPOFOL;  Surgeon: Wonda Horner, MD;  Location: [place];  Service: Gastroenterology;  Laterality: N/A;  . I & D EXTREMITY Left 02/18/2018   Procedure: IRRIGATION AND DEBRIDEMENT LEFT ARM;  Surgeon: Serafina Mitchell, MD;  [facility];  Service: Vascular;  Laterality: Left;  . INSERTION OF DIALYSIS CATHETER N/A 01/19/2018   Procedure: INSERTION OF DIALYSIS CATHETER - RIGHT INTERNAL JUGULAR PLACEMENT;  Surgeon: Rosetta Posner, MD;  Location: [place];  Service: Vascular;  Laterality: N/A;  . IR FLUORO GUIDE CV LINE RIGHT  11/10/2017  . IR PARACENTESIS  12/10/2017  . IR PARACENTESIS  12/26/2017  . IR REMOVAL TUN CV CATH W/O FL  11/28/2017  . IR THROMBECTOMY AV FISTULA W/THROMBOLYSIS/PTA INC/SHUNT/IMG LEFT Left 06/02/2019  . IR US GUIDE VASC ACCESS LEFT  06/02/2019  . IR US GUIDE VASC ACCESS RIGHT  11/10/2017  . NECK SURGERY  2007   "back of neck; no hardware in there"       Family History  Problem Relation Age  of Onset  . Diabetes Mellitus II Sister   . Diabetes Sister   . Stroke Brother   . Heart attack Brother     Social History   Tobacco Use  . Smoking status: Never Smoker  . Smokeless tobacco: Never Used  Vaping Use  . Vaping Use: Never used  Substance Use Topics  . Alcohol use: Never  . Drug use: Never    Home Medications Prior to Admission medications   Medication Sig Start Date End Date Taking? Authorizing Provider  amLODipine (NORVASC) 10 MG tablet Take 5 mg by mouth daily.    [provider]  calcium acetate (PHOSLO) 667 MG capsule Take 2 capsules (1,334 mg total) by mouth 3 (three) times daily with meals. Patient taking differently: Take 667 mg by mouth 3 (three) times daily with meals.  01/26/18   Purohit, Konrad Dolores, MD  carvedilol (COREG) 12.5 MG tablet Take 25 mg by mouth 2 (two) times daily. 03/29/20   [provider]  cetirizine (ZYRTEC) 5 MG tablet Take 1 tablet (5 mg total) by mouth 3 (three) times a week. Before dialysis 01/07/20   Rutherford Guys, MD  glucose blood (ONETOUCH VERIO) test strip USE 1 STRIP TO CHECK GLUCOSE THREE TIMES DAILY. DX:E11.65 01/31/20   Elayne Snare, MD  insulin degludec (TRESIBA FLEXTOUCH) 100 UNIT/ML SOPN FlexTouch Pen Inject 0.14 mLs (14 Units total)  into the skin daily. Inject 14 units under the skin once daily. Patient taking differently: Inject 12 Units into the skin daily.  03/02/19   Elayne Snare, MD  Insulin Pen Needle (ADVOCATE INSULIN PEN NEEDLES) 31G X 5 MM MISC Use four times daily to inject insulin 04/27/18   Elayne Snare, MD  insulin regular (NOVOLIN R,HUMULIN R) 100 units/mL injection Inject 6-8 Units into the skin See admin instructions. INJECT 6 UNITS UNDER THE SKIN AT BREAKFAST, 8 UNITS AT LUNCH AND 8 UNITS AT DINNER.    [provider]  lactulose (CHRONULAC) 10 GM/15ML solution Take 15 mLs (10 g total) by mouth 2 (two) times daily. 05/04/20   Domenic Polite, MD  lidocaine-prilocaine (EMLA) cream Apply 1  application topically Every Tuesday,Thursday,and Saturday with dialysis. 08/24/18   [provider]  Multiple Vitamins-Minerals (MULTIVITAMIN WITH MINERALS) tablet Take 1 tablet by mouth daily.    [provider]  mupirocin ointment (BACTROBAN) 2 % Apply 1 application topically 2 (two) times daily. 04/03/20   Trula Slade, DPM  ondansetron (ZOFRAN ODT) 4 MG disintegrating tablet Take 1 tablet (4 mg total) by mouth 2 (two) times daily as needed for nausea or vomiting. 05/01/20   [name], Madelynn Done C, PA-C  ONE TOUCH LANCETS MISC Use to test blood sugar three times daily 04/27/18   Elayne Snare, MD  pantoprazole (PROTONIX) 40 MG tablet Take 1 tablet (40 mg total) by mouth 2 (two) times daily. 05/04/20   Domenic Polite, MD  Polyethyl Glycol-Propyl Glycol (SYSTANE) 0.4-0.3 % SOLN Place 1 drop into both eyes 3 (three) times daily as needed (for dry eyes).    [provider]  rosuvastatin (CRESTOR) 5 MG tablet Take 1 tablet by mouth once daily Patient taking differently: Take 5 mg by mouth daily.  04/25/20   Elayne Snare, MD    Allergies    Heparin and Pork-derived products  Review of Systems   Review of Systems  Unable to perform ROS: Mental status change  Constitutional: Negative for chills and fever.  HENT: Negative for congestion and facial swelling.   Eyes: Negative for discharge and visual disturbance.  Respiratory: Negative for shortness of breath.   Cardiovascular: Negative for chest pain and palpitations.  Gastrointestinal: Negative for abdominal pain, diarrhea and vomiting.  Musculoskeletal: Negative for arthralgias and myalgias.  Skin: Negative for color change and rash.  Neurological: Negative for tremors, syncope and headaches.  Psychiatric/Behavioral: Negative for confusion and dysphoric mood.    Physical Exam Updated Vital Signs BP (!) 153/70 (BP Location: Right Arm)   Pulse 79   Temp 98.5 F (36.9 C)   Resp 16   SpO2 100%   Physical Exam Vitals  and nursing note reviewed.  Constitutional:      Appearance: He is well-developed.  HENT:     Head: Normocephalic and atraumatic.  Eyes:     Pupils: Pupils are equal, round, and reactive to light.  Neck:     Vascular: No JVD.  Cardiovascular:     Rate and Rhythm: Normal rate and regular rhythm.     Heart sounds: No murmur heard.  No friction rub. No gallop.   Pulmonary:     Effort: No respiratory distress.     Breath sounds: No wheezing.  Abdominal:     General: There is no distension.     Tenderness: There is no abdominal tenderness. There is no guarding or rebound.  Musculoskeletal:        General: Normal range of motion.  Cervical back: Normal range of motion and neck supple.     Comments: Moves all 4 extremities spontaneously.   Skin:    Coloration: Skin is not pale.     Findings: No rash.  Neurological:     Mental Status: He is alert.     ED Results / Procedures / Treatments   Labs (all labs ordered are listed, but only abnormal results are displayed) Labs Reviewed  AMMONIA - Abnormal; Notable for the following components:      Result Value   Ammonia 119 (*)    All other components within normal limits  COMPREHENSIVE METABOLIC PANEL - Abnormal; Notable for the following components:   Glucose, Bld 176 (*)    Creatinine, Ser 5.83 (*)    Albumin 2.5 (*)    Total Bilirubin 1.8 (*)    GFR calc non Af Amer 9 (*)    GFR calc Af Amer 11 (*)    All other components within normal limits  CBC WITH DIFFERENTIAL/PLATELET - Abnormal; Notable for the following components:   RBC 3.80 (*)    Hemoglobin 10.5 (*)    HCT 32.7 (*)    RDW 17.5 (*)    Platelets 67 (*)    All other components within normal limits  LACTIC ACID, PLASMA - Abnormal; Notable for the following components:   Lactic Acid, Venous 2.5 (*)    All other components within normal limits  CBG MONITORING, ED - Abnormal; Notable for the following components:   Glucose-Capillary 169 (*)    All other  components within normal limits  URINE CULTURE  RESPIRATORY PANEL BY RT PCR (FLU A&B, COVID)  PROTIME-INR  ETHANOL  URINALYSIS, ROUTINE W REFLEX MICROSCOPIC    EKG None  Radiology DG Chest 1 View  Result Date: 05/08/2020 CLINICAL DATA:  Altered level consciousness. EXAM: CHEST  1 VIEW COMPARISON:  Chest x-ray dated 05/02/2020 FINDINGS: There is cardiomegaly with vascular congestion. There is suggestion of hazy perihilar airspace opacities bilaterally. There is no pneumothorax. Aortic calcifications are noted. There are new metallic coils in the left upper quadrant in addition to radiopaque material. The patient appears to have undergone interval TIPS placement. IMPRESSION: 1. Cardiomegaly with vascular congestion and developing interstitial edema. 2. Interval placement of a transjugular intrahepatic portosystemic shunt. Interval CARTO of the previously demonstrated gastric varices. 3. In the setting of new altered level of consciousness and a recently placed TIPS, consider further evaluation of the patient's ammonia level. Electronically Signed   By: Constance Holster M.D.   On: 05/08/2020 01:18    Procedures Procedures (including critical care time)  Medications Ordered in ED Medications  lactulose (CHRONULAC) 10 GM/15ML solution 30 g (has no administration in time range)  insulin degludec (TRESIBA) 100 UNIT/ML FlexTouch Pen 10 Units (has no administration in time range)  lactulose (CHRONULAC) 10 GM/15ML solution 30 g (has no administration in time range)    ED Course  I have reviewed the triage vital signs and the nursing notes.  Pertinent labs & imaging results that were available during my care of the patient were reviewed by me and considered in my medical decision making (see chart for details).    MDM Rules/Calculators/A&P                          69 yo M recently in the hospital for hepatic encephalopathy comes in with altered mental status.  Likely recurrence of the  same.  Will obtain a  laboratory evaluation.  No signs of trauma to the head.  Ammonia level is 120.  No significant electrolyte abnormality.  Blood sugar in the 170s.  Will discuss with medicine for admission.  Given dose of lactulose.  The patients results and plan were reviewed and discussed.   Any x-rays performed were independently reviewed by myself.   Differential diagnosis were considered with the presenting HPI.  Medications  lactulose (CHRONULAC) 10 GM/15ML solution 30 g (has no administration in time range)  insulin degludec (TRESIBA) 100 UNIT/ML FlexTouch Pen 10 Units (has no administration in time range)  lactulose (CHRONULAC) 10 GM/15ML solution 30 g (has no administration in time range)    Vitals:   05/08/20 0057 05/08/20 0100  BP:  (!) 153/70  Pulse:  79  Resp:  16  Temp:  98.5 F (36.9 C)  SpO2: 100% 100%    Final diagnoses:  Hepatic encephalopathy ([place])    Admission/ observation were discussed with the admitting physician, patient and/or family and they are comfortable with the plan.     Final Clinical Impression(s) / ED Diagnoses Final diagnoses:  Hepatic encephalopathy [facility])    Rx / DC Orders ED Discharge Orders    None       Deno Etienne, DO 05/08/20 0157

## 2020-05-08 NOTE — H&P (Signed)
History and Physical    [name] WSF:[id number] DOB: 1950-12-06 DOA: 05/08/2020  PCP: Horald Pollen, MD  Patient coming from: Home  I have personally briefly reviewed patient's old medical records in [place]  Chief Complaint: AMS  HPI: William [name] is a 69 y.o. male with medical history significant of ESRD on TTS dialysis, goodpasture's dz, cirrhosis s/p TIPS last month, DM2, HTN.  Pt recently admitted with hepatic encephalopathy and hyper ammonemia 9/28-9/30.  This was following a TIPS procedure done earlier in Sept for bleeding esophageal varices.  Pt improved that admit with lactulose.  Discharged on lactulose.  Unfortunately pt now re-developed AMS throughout the day today, extra dose of lactulose at home didn't seem to help.  Pt brought in by brother for AMS.   ED Course: Ammonia 119, CXR shows vascular congestion and mild developing pulmonary edema.  (Also shows the new TIPS present).   Review of Systems: Unable to perform due to AMS Past Medical History:  Diagnosis Date   Anemia    Blind one eye    CHF (congestive heart failure) (HCC)    Cirrhosis (HCC)    Cirrhosis ([place])    CKD (chronic kidney disease), stage IV ([place])    Dialysis patient ([place])    Goodpasture's disease ([place])    on outpatient plasmapheresis/notes 11/20/2017   Grade I diastolic dysfunction 01/74/9449   History of anemia due to chronic kidney disease    History of blood transfusion X 1   "UGIB; low blood count"   History of plasmapheresis    "qod" (11/20/2017)   Hypertension    Pancytopenia ([place]) 08/20/2017   Type II diabetes mellitus ([place])     Past Surgical History:  Procedure Laterality Date   AV FISTULA PLACEMENT Left 12/26/2017   Procedure: LEFT BRACHIOCEPHALIC ARTERIOVENOUS (AV) FISTULA CREATION;  Surgeon: Conrad [place], MD;  Location: [place];  Service: Vascular;  Laterality: Left;   BASCILIC VEIN TRANSPOSITION Left 02/16/2018   Procedure: BRACHIOBASILIC  VEIN TRANSPOSITION SECOND STAGE;  Surgeon: Conrad Mabank, MD;  [facility];  Service: Vascular;  Laterality: Left;   CATARACT EXTRACTION W/ INTRAOCULAR LENS  IMPLANT, BILATERAL Bilateral    ESOPHAGEAL BANDING N/A 04/01/2018   Procedure: ESOPHAGEAL BANDING;  Surgeon: Otis Brace, MD;  [facility];  Service: Gastroenterology;  Laterality: N/A;   ESOPHAGEAL BANDING N/A 06/29/2018   Procedure: ESOPHAGEAL BANDING;  Surgeon: Otis Brace, MD;  [facility];  Service: Gastroenterology;  Laterality: N/A;   ESOPHAGOGASTRODUODENOSCOPY (EGD) WITH PROPOFOL N/A 04/01/2018   Procedure: ESOPHAGOGASTRODUODENOSCOPY (EGD) WITH PROPOFOL;  Surgeon: Otis Brace, MD;  [facility];  Service: Gastroenterology;  Laterality: N/A;   ESOPHAGOGASTRODUODENOSCOPY (EGD) WITH PROPOFOL N/A 06/29/2018   Procedure: ESOPHAGOGASTRODUODENOSCOPY (EGD) WITH PROPOFOL;  Surgeon: Otis Brace, MD;  [facility];  Service: Gastroenterology;  Laterality: N/A;   ESOPHAGOGASTRODUODENOSCOPY (EGD) WITH PROPOFOL N/A 09/30/2018   Procedure: ESOPHAGOGASTRODUODENOSCOPY (EGD) WITH PROPOFOL;  Surgeon: Otis Brace, MD;  [facility];  Service: Gastroenterology;  Laterality: N/A;   ESOPHAGOGASTRODUODENOSCOPY (EGD) WITH PROPOFOL N/A 04/20/2019   Procedure: ESOPHAGOGASTRODUODENOSCOPY (EGD) WITH PROPOFOL;  Surgeon: Otis Brace, MD;  [facility];  Service: Gastroenterology;  Laterality: N/A;   ESOPHAGOGASTRODUODENOSCOPY (EGD) WITH PROPOFOL N/A 04/17/2020   Procedure: ESOPHAGOGASTRODUODENOSCOPY (EGD) WITH PROPOFOL;  Surgeon: Wonda Horner, MD;  Location: [place];  Service: Gastroenterology;  Laterality: N/A;   I & D EXTREMITY Left 02/18/2018   Procedure: IRRIGATION AND DEBRIDEMENT LEFT ARM;  Surgeon: Serafina Mitchell, MD;  [facility];  Service: Vascular;  Laterality: Left;   INSERTION OF DIALYSIS CATHETER N/A 01/19/2018   Procedure: INSERTION OF  DIALYSIS CATHETER - RIGHT INTERNAL JUGULAR PLACEMENT;  Surgeon: Rosetta Posner, MD;  Location: [place] OR;  Service: Vascular;  Laterality: N/A;   IR FLUORO GUIDE CV LINE RIGHT  11/10/2017   IR PARACENTESIS  12/10/2017   IR PARACENTESIS  12/26/2017   IR REMOVAL TUN CV CATH W/O FL  11/28/2017   IR THROMBECTOMY AV FISTULA W/THROMBOLYSIS/PTA INC/SHUNT/IMG LEFT Left 06/02/2019   IR US GUIDE VASC ACCESS LEFT  06/02/2019   IR US GUIDE VASC ACCESS RIGHT  11/10/2017   NECK SURGERY  2007   "back of neck; no hardware in there"     reports that he has never smoked. He has never used smokeless tobacco. He reports that he does not drink alcohol and does not use drugs.  Allergies  Allergen Reactions   Heparin Other (See Comments)    Patient is Muslim and is not permitted Pork derative due to religious belief)   Pork-Derived Products Other (See Comments)    Patient does not eat pork due to religious beliefs    Family History  Problem Relation Age of Onset   Diabetes Mellitus II Sister    Diabetes Sister    Stroke Brother    Heart attack Brother      Prior to Admission medications   Medication Sig Start Date End Date Taking? Authorizing Provider  amLODipine (NORVASC) 10 MG tablet Take 5 mg by mouth daily.    [provider]  calcium acetate (PHOSLO) 667 MG capsule Take 2 capsules (1,334 mg total) by mouth 3 (three) times daily with meals. Patient taking differently: Take 667 mg by mouth 3 (three) times daily with meals.  01/26/18   Purohit, Konrad Dolores, MD  carvedilol (COREG) 12.5 MG tablet Take 25 mg by mouth 2 (two) times daily. 03/29/20   [provider]  cetirizine (ZYRTEC) 5 MG tablet Take 1 tablet (5 mg total) by mouth 3 (three) times a week. Before dialysis 01/07/20   Rutherford Guys, MD  glucose blood (ONETOUCH VERIO) test strip USE 1 STRIP TO CHECK GLUCOSE THREE TIMES DAILY. DX:E11.65 01/31/20   Elayne Snare, MD  insulin degludec (TRESIBA FLEXTOUCH) 100 UNIT/ML SOPN FlexTouch  Pen Inject 0.14 mLs (14 Units total) into the skin daily. Inject 14 units under the skin once daily. Patient taking differently: Inject 12 Units into the skin daily.  03/02/19   Elayne Snare, MD  Insulin Pen Needle (ADVOCATE INSULIN PEN NEEDLES) 31G X 5 MM MISC Use four times daily to inject insulin 04/27/18   Elayne Snare, MD  insulin regular (NOVOLIN R,HUMULIN R) 100 units/mL injection Inject 6-8 Units into the skin See admin instructions. INJECT 6 UNITS UNDER THE SKIN AT BREAKFAST, 8 UNITS AT LUNCH AND 8 UNITS AT DINNER.    [provider]  lactulose (CHRONULAC) 10 GM/15ML solution Take 15 mLs (10 g total) by mouth 2 (two) times daily. 05/04/20   Domenic Polite, MD  lidocaine-prilocaine (EMLA) cream Apply 1 application topically Every Tuesday,Thursday,and Saturday with dialysis. 08/24/18   [provider]  Multiple Vitamins-Minerals (MULTIVITAMIN WITH MINERALS) tablet Take 1 tablet by mouth daily.    [provider]  mupirocin ointment (BACTROBAN) 2 % Apply 1 application topically 2 (two) times daily. 04/03/20   Trula Slade, DPM  ondansetron (ZOFRAN ODT) 4 MG disintegrating tablet Take 1 tablet (4 mg total) by mouth 2 (two)  times daily as needed for nausea or vomiting. 05/01/20   [name], Madelynn Done C, PA-C  ONE TOUCH LANCETS MISC Use to test blood sugar three times daily 04/27/18   Elayne Snare, MD  pantoprazole (PROTONIX) 40 MG tablet Take 1 tablet (40 mg total) by mouth 2 (two) times daily. 05/04/20   Domenic Polite, MD  Polyethyl Glycol-Propyl Glycol (SYSTANE) 0.4-0.3 % SOLN Place 1 drop into both eyes 3 (three) times daily as needed (for dry eyes).    [provider]  rosuvastatin (CRESTOR) 5 MG tablet Take 1 tablet by mouth once daily Patient taking differently: Take 5 mg by mouth daily.  04/25/20   Elayne Snare, MD    Physical Exam: Vitals:   05/08/20 0057 05/08/20 0100  BP:  (!) 153/70  Pulse:  79  Resp:  16  Temp:  98.5 F (36.9 C)  SpO2: 100% 100%     Constitutional: Lethargic Eyes: PERRL, lids and conjunctivae normal ENMT: Mucous membranes are moist. Posterior pharynx clear of any exudate or lesions.Normal dentition.  Neck: normal, supple, no masses, no thyromegaly Respiratory: clear to auscultation bilaterally, no wheezing, no crackles. Normal respiratory effort. No accessory muscle use.  Cardiovascular: Regular rate and rhythm, no murmurs / rubs / gallops. No extremity edema. 2+ pedal pulses. No carotid bruits.  Abdomen: no tenderness, no masses palpated. No hepatosplenomegaly. Bowel sounds positive.  Musculoskeletal: no clubbing / cyanosis. No joint deformity upper and lower extremities. Good ROM, no contractures. Normal muscle tone.  Skin: no rashes, lesions, ulcers. No induration Neurologic: MAE, grossly non-focal Psychiatric: Lethargic   Labs on Admission: I have personally reviewed following labs and imaging studies  CBC: Recent Labs  Lab 05/01/20 1834 05/02/20 1208 05/03/20 0732 05/04/20 0841 05/08/20 0042  WBC 3.8* 4.6 3.5* 3.8* 4.2  NEUTROABS  --   --   --   --  2.6  HGB 9.7* 9.8* 8.9* 9.4* 10.5*  HCT 31.0* 30.0* 27.9* 29.8* 32.7*  MCV 84.7 83.8 84.8 84.7 86.1  PLT 77* 57* 60* 68* 67*   Basic Metabolic Panel: Recent Labs  Lab 05/01/20 1834 05/02/20 1208 05/03/20 0732 05/04/20 0841 05/08/20 0042  NA 137 136 137 139 137  K 4.2 4.4 4.2 3.8 3.8  CL 102 100 100 102 101  CO2 23 22 23 24 24   GLUCOSE 281* 205* 310* 199* 176*  BUN 31* 17 26* 31* 23  CREATININE 6.38* 3.90* 5.58* 7.31* 5.83*  CALCIUM 8.9 9.3 8.8* 9.0 9.6  PHOS  --   --   --  3.7  --    GFR: Estimated Creatinine Clearance: 10.5 mL/min (A) (by C-G formula based on SCr of 5.83 mg/dL (H)). Liver Function Tests: Recent Labs  Lab 05/02/20 1208 05/03/20 0732 05/04/20 0841 05/08/20 0042  AST 34 27 27 30   ALT 23 22 20 15   ALKPHOS 100 85 93 103  BILITOT 2.0* 2.0* 2.0* 1.8*  PROT 6.3* 6.0* 6.4* 6.8  ALBUMIN 2.4* 2.3* 2.4* 2.5*   No  results for input(s): LIPASE, AMYLASE in the last 168 hours. Recent Labs  Lab 05/02/20 1208 05/03/20 0732 05/08/20 0101  AMMONIA 67* 40* 119*   Coagulation Profile: Recent Labs  Lab 05/02/20 1208 05/08/20 0042  INR 1.2 1.2   Cardiac Enzymes: No results for input(s): CKTOTAL, CKMB, CKMBINDEX, TROPONINI in the last 168 hours. BNP (last 3 results) No results for input(s): PROBNP in the last 8760 hours. HbA1C: No results for input(s): HGBA1C in the last 72 hours. CBG: Recent Labs  Lab 05/03/20 2156 05/04/20 0651 05/04/20 1149 05/04/20 1643 05/08/20 0101  GLUCAP 215* 174* 239* 178* 169*   Lipid Profile: No results for input(s): CHOL, HDL, LDLCALC, TRIG, CHOLHDL, LDLDIRECT in the last 72 hours. Thyroid Function Tests: No results for input(s): TSH, T4TOTAL, FREET4, T3FREE, THYROIDAB in the last 72 hours. Anemia Panel: No results for input(s): VITAMINB12, FOLATE, FERRITIN, TIBC, IRON, RETICCTPCT in the last 72 hours. Urine analysis:    Component Value Date/Time   COLORURINE YELLOW 04/18/2020 0118   APPEARANCEUR HAZY (A) 04/18/2020 0118   LABSPEC 1.027 04/18/2020 0118   PHURINE 5.0 04/18/2020 0118   GLUCOSEU NEGATIVE 04/18/2020 0118   HGBUR NEGATIVE 04/18/2020 0118   BILIRUBINUR NEGATIVE 04/18/2020 0118   BILIRUBINUR neg 09/14/2015 1441   KETONESUR NEGATIVE 04/18/2020 0118   PROTEINUR 30 (A) 04/18/2020 0118   UROBILINOGEN 0.2 09/14/2015 1441   UROBILINOGEN 0.2 01/27/2013 2052   NITRITE NEGATIVE 04/18/2020 0118   LEUKOCYTESUR NEGATIVE 04/18/2020 0118    Radiological Exams on Admission: DG Chest 1 View  Result Date: 05/08/2020 CLINICAL DATA:  Altered level consciousness. EXAM: CHEST  1 VIEW COMPARISON:  Chest x-ray dated 05/02/2020 FINDINGS: There is cardiomegaly with vascular congestion. There is suggestion of hazy perihilar airspace opacities bilaterally. There is no pneumothorax. Aortic calcifications are noted. There are new metallic coils in the left upper  quadrant in addition to radiopaque material. The patient appears to have undergone interval TIPS placement. IMPRESSION: 1. Cardiomegaly with vascular congestion and developing interstitial edema. 2. Interval placement of a transjugular intrahepatic portosystemic shunt. Interval CARTO of the previously demonstrated gastric varices. 3. In the setting of new altered level of consciousness and a recently placed TIPS, consider further evaluation of the patient's ammonia level. Electronically Signed   By: Constance Holster M.D.   On: 05/08/2020 01:18    EKG: Independently reviewed.  Assessment/Plan Principal Problem:   Hepatic encephalopathy (HCC) Active Problems:   Type II diabetes mellitus with renal manifestations (HCC)   Acute on chronic diastolic CHF (congestive heart failure) (HCC)   Essential hypertension   Goodpasture's syndrome ([place]) with associated hemoptysis   Cirrhosis of liver with ascites ([place])   ESRD on dialysis ([place])    1. Hepatic encephalopathy - 1. In setting of cirrhosis and recent TIPS 2. Trying PO lactulose first 3. If not able to get him to take this PO will have to do PR lactulose instead 4. Lactulose ordered 30g TID for the moment 2. Acute on chronic diastolic CHF - 1. No O2 requirement 2. Call nephro in AM to see if they want to dialyze today 3. DM2 - 1. Insulin Degludec 10u daily (takes 12 at baseline) 2. Sensitive SSI AC 4. ESRD - 1. Call nephro in AM as above 5. HTN - 1. Cont home BP meds  DVT prophylaxis: SCDs - chronic thrombocytopenia Code Status: Full Family Communication: Brother at bedside Disposition Plan: Home after mental status improved Consults called: None Admission status: Place in obs    Halli Equihua, [place]  How to contact the [facility] Attending or Consulting provider [place] or covering provider during after hours [place], for this patient?  1. Check the care team in [facility] and look for a) attending/consulting [facility] provider  listed and b) the [facility] team listed 2. Log into [email or web address]  Amion Physician Scheduling and messaging for groups and whole hospitals  On call and physician scheduling software for group practices, residents, hospitalists and other medical providers for call, clinic, rotation and  shift schedules. OnCall Enterprise is a hospital-wide system for scheduling doctors and paging doctors on call. EasyPlot is for scientific plotting and data analysis.  [email or web address]  and use [place]'s universal password to access. If you do not have the password, please contact the hospital operator.  3. Locate the [facility] provider you are looking for under [facility] and page to a number that you can be directly reached. 4. If you still have difficulty reaching the provider, please page the [facility] (Director on Call) for the Hospitalists listed on amion for assistance.  05/08/2020, 2:30 AM

## 2020-05-08 NOTE — ED Notes (Signed)
CBG 169. 

## 2020-05-08 NOTE — Consult Note (Signed)
Renal Service Consult Note [facility]  William [name] 05/08/2020 [place] Requesting Physician: Dr Marthenia Rolling  Reason for Consult:  ESRD pt w/ AMS and pulm edema HPI: The patient is a 69 y.o. year-old w/ hx of ESRD (Goodpasture's), cirrhosis, HTN, DM2 presented to ED early today w/ AMS. Per ED notes pt was at [facility] for same issue for 1 week and had a TIPS procedure done. TTS HD. Last HD Sat. Non-english speaking, son at bedside.   With son interpreting pt denies any SOB, cough or CP, no abd pain. Pt is confused.        ROS  denies CP  no joint pain   no HA  no blurry vision  no rash  no diarrhea  no nausea/ vomiting  no dysuria  no difficulty voiding  no change in urine color    Past Medical History  Past Medical History:  Diagnosis Date  . Anemia   . Blind one eye   . CHF (congestive heart failure) ([place])   . Cirrhosis ([place])   . Cirrhosis ([place])   . CKD (chronic kidney disease), stage IV ([place])   . Dialysis patient ([place])   . Goodpasture's disease [facility])    on outpatient plasmapheresis/notes 11/20/2017  . Grade I diastolic dysfunction 07/28/8249  . History of anemia due to chronic kidney disease   . History of blood transfusion X 1   "UGIB; low blood count"  . History of plasmapheresis    "qod" (11/20/2017)  . Hypertension   . Pancytopenia ([place]) 08/20/2017  . Type II diabetes mellitus ([place])    Past Surgical History  Past Surgical History:  Procedure Laterality Date  . AV FISTULA PLACEMENT Left 12/26/2017   Procedure: LEFT BRACHIOCEPHALIC ARTERIOVENOUS (AV) FISTULA CREATION;  Surgeon: Conrad Poc[place]ontas, MD;  Location: [place];  Service: Vascular;  Laterality: Left;  . BASCILIC VEIN TRANSPOSITION Left 02/16/2018   Procedure: BRACHIOBASILIC VEIN TRANSPOSITION SECOND STAGE;  Surgeon: Conrad [place], MD;  Location: [place];  Service: Vascular;  Laterality: Left;  . CATARACT EXTRACTION W/ INTRAOCULAR LENS  IMPLANT, BILATERAL Bilateral   . ESOPHAGEAL BANDING N/A  04/01/2018   Procedure: ESOPHAGEAL BANDING;  Surgeon: Otis Brace, MD;  [facility];  Service: Gastroenterology;  Laterality: N/A;  . ESOPHAGEAL BANDING N/A 06/29/2018   Procedure: ESOPHAGEAL BANDING;  Surgeon: Otis Brace, MD;  [facility];  Service: Gastroenterology;  Laterality: N/A;  . ESOPHAGOGASTRODUODENOSCOPY (EGD) WITH PROPOFOL N/A 04/01/2018   Procedure: ESOPHAGOGASTRODUODENOSCOPY (EGD) WITH PROPOFOL;  Surgeon: Otis Brace, MD;  [facility];  Service: Gastroenterology;  Laterality: N/A;  . ESOPHAGOGASTRODUODENOSCOPY (EGD) WITH PROPOFOL N/A 06/29/2018   Procedure: ESOPHAGOGASTRODUODENOSCOPY (EGD) WITH PROPOFOL;  Surgeon: Otis Brace, MD;  [facility];  Service: Gastroenterology;  Laterality: N/A;  . ESOPHAGOGASTRODUODENOSCOPY (EGD) WITH PROPOFOL N/A 09/30/2018   Procedure: ESOPHAGOGASTRODUODENOSCOPY (EGD) WITH PROPOFOL;  Surgeon: Otis Brace, MD;  [facility];  Service: Gastroenterology;  Laterality: N/A;  . ESOPHAGOGASTRODUODENOSCOPY (EGD) WITH PROPOFOL N/A 04/20/2019   Procedure: ESOPHAGOGASTRODUODENOSCOPY (EGD) WITH PROPOFOL;  Surgeon: Otis Brace, MD;  [facility];  Service: Gastroenterology;  Laterality: N/A;  . ESOPHAGOGASTRODUODENOSCOPY (EGD) WITH PROPOFOL N/A 04/17/2020   Procedure: ESOPHAGOGASTRODUODENOSCOPY (EGD) WITH PROPOFOL;  Surgeon: Wonda Horner, MD;  Location: [place];  Service: Gastroenterology;  Laterality: N/A;  . I & D EXTREMITY Left 02/18/2018   Procedure: IRRIGATION AND DEBRIDEMENT LEFT ARM;  Surgeon: Serafina Mitchell, MD;  [facility];  Service: Vascular;  Laterality: Left;  .  INSERTION OF DIALYSIS CATHETER N/A 01/19/2018   Procedure: INSERTION OF DIALYSIS CATHETER - RIGHT INTERNAL JUGULAR PLACEMENT;  Surgeon: Rosetta Posner, MD;  Location: [place];  Service: Vascular;  Laterality: N/A;  . IR FLUORO GUIDE CV LINE RIGHT  11/10/2017  . IR PARACENTESIS  12/10/2017  . IR  PARACENTESIS  12/26/2017  . IR REMOVAL TUN CV CATH W/O FL  11/28/2017  . IR THROMBECTOMY AV FISTULA W/THROMBOLYSIS/PTA INC/SHUNT/IMG LEFT Left 06/02/2019  . IR US GUIDE VASC ACCESS LEFT  06/02/2019  . IR US GUIDE VASC ACCESS RIGHT  11/10/2017  . NECK SURGERY  2007   "back of neck; no hardware in there"   Family History  Family History  Problem Relation Age of Onset  . Diabetes Mellitus II Sister   . Diabetes Sister   . Stroke Brother   . Heart attack Brother    Social History  reports that he has never smoked. He has never used smokeless tobacco. He reports that he does not drink alcohol and does not use drugs. Allergies  Allergies  Allergen Reactions  . Heparin Other (See Comments)    Patient is Muslim and is not permitted Pork derative due to religious belief)  . Pork-Derived Products Other (See Comments)    Patient does not eat pork due to religious beliefs   Home medications Prior to Admission medications   Medication Sig Start Date End Date Taking? Authorizing Provider  amLODipine (NORVASC) 10 MG tablet Take 5 mg by mouth daily.   Yes [provider]  carvedilol (COREG) 12.5 MG tablet Take 12.5 mg by mouth 2 (two) times daily.  03/29/20  Yes [provider]  insulin regular (NOVOLIN R,HUMULIN R) 100 units/mL injection Inject 6-8 Units into the skin See admin instructions. INJECT 6 UNITS UNDER THE SKIN AT BREAKFAST, 8 UNITS AT LUNCH AND 8 UNITS AT DINNER.   Yes [provider]  lactulose (CHRONULAC) 10 GM/15ML solution Take 15 mLs (10 g total) by mouth 2 (two) times daily. 05/04/20  Yes Domenic Polite, MD  lidocaine-prilocaine (EMLA) cream Apply 1 application topically Every Tuesday,Thursday,and Saturday with dialysis. 08/24/18  Yes [provider]  ondansetron (ZOFRAN ODT) 4 MG disintegrating tablet Take 1 tablet (4 mg total) by mouth 2 (two) times daily as needed for nausea or vomiting. 05/01/20  Yes [name], Hallie C, PA-C  pantoprazole (PROTONIX)  40 MG tablet Take 1 tablet (40 mg total) by mouth 2 (two) times daily. 05/04/20  Yes Domenic Polite, MD  rosuvastatin (CRESTOR) 5 MG tablet Take 1 tablet by mouth once daily Patient taking differently: Take 5 mg by mouth daily.  04/25/20  Yes Elayne Snare, MD  calcium acetate (PHOSLO) 667 MG capsule Take 2 capsules (1,334 mg total) by mouth 3 (three) times daily with meals. Patient not taking: Reported on 05/08/2020 01/26/18   Purohit, Konrad Dolores, MD  cetirizine (ZYRTEC) 5 MG tablet Take 1 tablet (5 mg total) by mouth 3 (three) times a week. Before dialysis Patient not taking: Reported on 05/08/2020 01/07/20   Rutherford Guys, MD  glucose blood (ONETOUCH VERIO) test strip USE 1 STRIP TO CHECK GLUCOSE THREE TIMES DAILY. DX:E11.65 01/31/20   Elayne Snare, MD  insulin degludec (TRESIBA FLEXTOUCH) 100 UNIT/ML SOPN FlexTouch Pen Inject 0.14 mLs (14 Units total) into the skin daily. Inject 14 units under the skin once daily. Patient not taking: Reported on 05/08/2020 03/02/19   Elayne Snare, MD  Insulin Pen Needle (ADVOCATE INSULIN PEN NEEDLES) 31G X 5 MM  MISC Use four times daily to inject insulin 04/27/18   Elayne Snare, MD  mupirocin ointment (BACTROBAN) 2 % Apply 1 application topically 2 (two) times daily. Patient not taking: Reported on 05/08/2020 04/03/20   Trula Slade, DPM  ONE [facility] LANCETS MISC Use to test blood sugar three times daily 04/27/18   Elayne Snare, MD     Vitals:   05/08/20 0937 05/08/20 1000 05/08/20 1005 05/08/20 1100  BP: (!) 146/66 (!) 160/68 (!) 160/68 (!) 159/75  Pulse: 73 73  75  Resp: 19 15  (!) 21  Temp:      SpO2: 95% 97%  97%   Exam Gen elderly chron ill appearing, not responding verbally, following commands though No rash, cyanosis or gangrene Sclera anicteric, throat clear  No jvd or bruits Chest some mild crackles bilat bases, no rhonchi/ bronc BS RRR no MRG Abd soft ntnd no mass or ascites +bs  GU normal male MS no joint effusions or deformity Ext no leg or UE  edema, no wounds or ulcers Neuro is lethargic, gen'd weakness, nonfocal   L AVF + bruit   Home meds:  - norvasc 5/ coreg 12.5 bid/ crestor 5  - insulin tresiba 14u qd/ regular 6-8 u tid ssi  - lactulose 10gm bid  - phoslo 2 ac tid/ protonix 40 bid  - prn's/ vitamins/ supplements   CXR 10/04 - IMPRESSION: Cardiomegaly with vascular congestion and developing interstitial edema. 2. Interval placement of a transjugular intrahepatic portosystemic shunt. Interval CARTO of the previously demonstrated gastric varices...     OP HD: TTS HD   4h  67.5kg  2/3.5Ca  L  AVF  Hep none    - mircera 60 q2 , to start 10/5  - last HD 10/2, left under at 65.2kg      Na 136  K 3.8  BUN 23  Cr 5.8      Assessment: 1. Pulm edema - not symptomatic, most likely vol overload in esrd pt 2. ESRD - on HD TTS, last HD Sat.  Has not missed HD recently.  3. AMS - hx recent TIPS, prob d/t cirrhosis.  B/Cr not sig ^^.  4. Cirrhosis - sp TIPS last week at [facility] 5. HTN - cont meds 6. IDDM - pre primary team 7. Anemia ckd - Hb 9.4, esa w/ HD tomorrow   Plan - extra HD today, then usual HD tomorrow, get vol down   Kelly Splinter  MD 05/08/2020, 2:02 PM  Recent Labs  Lab 05/04/20 0841 05/04/20 0841 05/08/20 0042 05/08/20 0749  WBC 3.8*  --  4.2  --   HGB 9.4*   < > 10.5* 9.5*   < > = values in this interval not displayed.   Recent Labs  Lab 05/04/20 0841 05/04/20 0841 05/08/20 0042 05/08/20 0749  K 3.8   < > 3.8 4.3  BUN 31*  --  23  --   CREATININE 7.31*  --  5.83*  --   CALCIUM 9.0  --  9.6  --   PHOS 3.7  --   --   --    < > = values in this interval not displayed.

## 2020-05-08 NOTE — ED Notes (Signed)
Notified MD [facility] called and notified pt is on a liver and kidney transplant list and also reports had the TIPS procedure which made him confused and reccomends giving pt Xiafaxian 550 mg daily along with the lactulose to help with confusion and can add zinc outpatient and reccomends you call her nurse teransplant coordinator Erasmo Downer [phone]

## 2020-05-08 NOTE — ED Notes (Signed)
Pt unable to finish PO lactulose. MD notified. RN to attempt again when able.

## 2020-05-08 NOTE — Progress Notes (Signed)
Brief note: -Patient was admitted earlier today. -Currently see H&P done by Dr. Harlene Ramus. -As per Dr. Alcario Drought " William [name] is a 69 y.o. male with medical history significant of ESRD on TTS dialysis, goodpasture's dz, cirrhosis s/p TIPS last month, DM2, HTN.  Pt recently admitted with hepatic encephalopathy and hyper ammonemia 9/28-9/30.  This was following a TIPS procedure done earlier in Sept for bleeding esophageal varices.  Pt improved that admit with lactulose.  Discharged on lactulose.  Unfortunately pt now re-developed AMS throughout the day today, extra dose of lactulose at home didn't seem to help.  Pt brought in by brother for AMS.   ED Course: Ammonia 119, CXR shows vascular congestion and mild developing pulmonary edema.  (Also shows the new TIPS present)".  -Lactulose changed to rectal as patient is not awake enough to take oral lactulose.   -Add rifaximin. -Continue to monitor ammonia level. -Nephrology team consulted for hemodialysis. -Patient is on liver and kidney transplant team at [facility]. -Further management will depend on hospital course.

## 2020-05-09 ENCOUNTER — Inpatient Hospital Stay (HOSPITAL_COMMUNITY): Payer: Medicare Other

## 2020-05-09 DIAGNOSIS — E1121 Type 2 diabetes mellitus with diabetic nephropathy: Secondary | ICD-10-CM

## 2020-05-09 DIAGNOSIS — K7290 Hepatic failure, unspecified without coma: Secondary | ICD-10-CM

## 2020-05-09 DIAGNOSIS — N186 End stage renal disease: Secondary | ICD-10-CM

## 2020-05-09 DIAGNOSIS — R188 Other ascites: Secondary | ICD-10-CM

## 2020-05-09 DIAGNOSIS — K7460 Unspecified cirrhosis of liver: Secondary | ICD-10-CM | POA: Diagnosis not present

## 2020-05-09 DIAGNOSIS — M310 Hypersensitivity angiitis: Secondary | ICD-10-CM

## 2020-05-09 DIAGNOSIS — R4182 Altered mental status, unspecified: Secondary | ICD-10-CM | POA: Diagnosis not present

## 2020-05-09 DIAGNOSIS — I10 Essential (primary) hypertension: Secondary | ICD-10-CM | POA: Diagnosis not present

## 2020-05-09 DIAGNOSIS — Z992 Dependence on renal dialysis: Secondary | ICD-10-CM

## 2020-05-09 LAB — COMPREHENSIVE METABOLIC PANEL
ALT: 14 U/L (ref 0–44)
AST: 26 U/L (ref 15–41)
Albumin: 2.2 g/dL — ABNORMAL LOW (ref 3.5–5.0)
Alkaline Phosphatase: 88 U/L (ref 38–126)
Anion gap: 12 (ref 5–15)
BUN: 11 mg/dL (ref 8–23)
CO2: 26 mmol/L (ref 22–32)
Calcium: 8.4 mg/dL — ABNORMAL LOW (ref 8.9–10.3)
Chloride: 96 mmol/L — ABNORMAL LOW (ref 98–111)
Creatinine, Ser: 3.39 mg/dL — ABNORMAL HIGH (ref 0.61–1.24)
GFR calc Af Amer: 20 mL/min — ABNORMAL LOW (ref >60)
GFR calc non Af Amer: 18 mL/min — ABNORMAL LOW (ref >60)
Glucose, Bld: 189 mg/dL — ABNORMAL HIGH (ref 70–99)
Potassium: 3.2 mmol/L — ABNORMAL LOW (ref 3.5–5.1)
Sodium: 134 mmol/L — ABNORMAL LOW (ref 135–145)
Total Bilirubin: 1.9 mg/dL — ABNORMAL HIGH (ref 0.3–1.2)
Total Protein: 6 g/dL — ABNORMAL LOW (ref 6.5–8.1)

## 2020-05-09 LAB — CBC WITH DIFFERENTIAL/PLATELET
Abs Immature Granulocytes: 0.01 10*3/uL (ref 0.00–0.07)
Basophils Absolute: 0 10*3/uL (ref 0.0–0.1)
Basophils Relative: 0 %
Eosinophils Absolute: 0.1 10*3/uL (ref 0.0–0.5)
Eosinophils Relative: 2 %
HCT: 28.7 % — ABNORMAL LOW (ref 39.0–52.0)
Hemoglobin: 9.2 g/dL — ABNORMAL LOW (ref 13.0–17.0)
Immature Granulocytes: 0 %
Lymphocytes Relative: 17 %
Lymphs Abs: 0.8 10*3/uL (ref 0.7–4.0)
MCH: 26.5 pg (ref 26.0–34.0)
MCHC: 32.1 g/dL (ref 30.0–36.0)
MCV: 82.7 fL (ref 80.0–100.0)
Monocytes Absolute: 0.7 10*3/uL (ref 0.1–1.0)
Monocytes Relative: 14 %
Neutro Abs: 3.2 10*3/uL (ref 1.7–7.7)
Neutrophils Relative %: 67 %
Platelets: 50 10*3/uL — ABNORMAL LOW (ref 150–400)
RBC: 3.47 MIL/uL — ABNORMAL LOW (ref 4.22–5.81)
RDW: 17.1 % — ABNORMAL HIGH (ref 11.5–15.5)
WBC: 4.8 10*3/uL (ref 4.0–10.5)
nRBC: 0 % (ref 0.0–0.2)

## 2020-05-09 LAB — AMMONIA: Ammonia: 53 umol/L — ABNORMAL HIGH (ref 9–35)

## 2020-05-09 LAB — GLUCOSE, CAPILLARY: Glucose-Capillary: 135 mg/dL — ABNORMAL HIGH (ref 70–99)

## 2020-05-09 MED ORDER — DARBEPOETIN ALFA 60 MCG/0.3ML IJ SOSY
60.0000 ug | PREFILLED_SYRINGE | INTRAMUSCULAR | Status: DC
  Filled 2020-05-09: qty 0.3

## 2020-05-09 MED ORDER — ALBUMIN HUMAN 25 % IV SOLN
25.0000 g | Freq: Once | INTRAVENOUS | Status: AC
  Filled 2020-05-09: qty 100

## 2020-05-09 MED ORDER — ALBUMIN HUMAN 25 % IV SOLN
INTRAVENOUS | Status: AC
  Administered 2020-05-09: 25 g via INTRAVENOUS
  Filled 2020-05-09: qty 100

## 2020-05-09 MED ORDER — DARBEPOETIN ALFA 60 MCG/0.3ML IJ SOSY
PREFILLED_SYRINGE | INTRAMUSCULAR | Status: AC
  Administered 2020-05-09: 60 ug via INTRAVENOUS
  Filled 2020-05-09: qty 0.3

## 2020-05-09 NOTE — Progress Notes (Signed)
William [name] Progress Note   Subjective:  Seen in room. Son at bedside, interpreting. Completed dialysis yesterday - tolerated 1L UF. Tired after dialysis. Denies cough, SOB, CP. MS improving per son.   Objective Vitals:   05/08/20 2200 05/08/20 2230 05/08/20 2300 05/09/20 0432  BP: (!) 104/53 (!) 105/58 (!) 134/42 (!) 112/58  Pulse: 73 78 89 76  Resp:    18  Temp:   98.9 F (37.2 C) 97.8 F (36.6 C)  TempSrc:   Oral Oral  SpO2:   98% 98%  Weight:   64.9 kg     Additional Objective Labs: Basic Metabolic Panel: Recent Labs  Lab 05/04/20 0841 05/04/20 0841 05/08/20 0042 05/08/20 0749 05/09/20 0401  NA 139   < > 137 136 134*  K 3.8   < > 3.8 4.3 3.2*  CL 102  --  101  --  96*  CO2 24  --  24  --  26  GLUCOSE 199*  --  176*  --  189*  BUN 31*  --  23  --  11  CREATININE 7.31*  --  5.83*  --  3.39*  CALCIUM 9.0  --  9.6  --  8.4*  PHOS 3.7  --   --   --   --    < > = values in this interval not displayed.   CBC: Recent Labs  Lab 05/02/20 1208 05/02/20 1208 05/03/20 0732 05/03/20 0732 05/04/20 0841 05/04/20 0841 05/08/20 0042 05/08/20 0749 05/09/20 0401  WBC 4.6   < > 3.5*   < > 3.8*  --  4.2  --  4.8  NEUTROABS  --   --   --   --   --   --  2.6  --  3.2  HGB 9.8*   < > 8.9*   < > 9.4*   < > 10.5* 9.5* 9.2*  HCT 30.0*   < > 27.9*   < > 29.8*   < > 32.7* 28.0* 28.7*  MCV 83.8  --  84.8  --  84.7  --  86.1  --  82.7  PLT 57*   < > 60*   < > 68*  --  67*  --  50*   < > = values in this interval not displayed.   Blood Culture    Component Value Date/Time   SDES URINE, CLEAN CATCH 04/18/2020 0118   SPECREQUEST  04/18/2020 0118    NONE Performed at [facility] [address], [place], [place] [address]    CULT (A) 04/18/2020 0118    70,000 COLONIES/mL ESCHERICHIA COLI Confirmed Extended Spectrum Beta-Lactamase Producer (ESBL).  In bloodstream infections from ESBL organisms, carbapenems are preferred over piperacillin/tazobactam. They  are shown to have a lower risk of mortality.    REPTSTATUS 04/19/2020 FINAL 04/18/2020 0118     Physical Exam General: WNWD man, nad, Heart: RRR S1,SE Lungs: Clear bilaterally, breathing unlabored on RA Abdomen: soft non-tender Extremities: No LE edema  Dialysis Access: LUE AVF +bruit   Medications:  . amLODipine  5 mg Oral Daily  . calcium acetate  667 mg Oral TID WC  . carvedilol  25 mg Oral BID WC  . Chlorhexidine Gluconate Cloth  6 each Topical Q0600  . insulin aspart  0-9 Units Subcutaneous TID WC  . insulin glargine  10 Units Subcutaneous Daily  . lactulose  30 g Oral TID  . multivitamin with minerals  1 tablet Oral Daily  .  pantoprazole  40 mg Oral BID  . rifaximin  550 mg Oral Daily  . rosuvastatin  5 mg Oral Daily    Dialysis Orders:  AF TTS 4h  67.5kg  2/3.5Ca  L  AVF  Hep none    - mircera 60 q2 , to start 10/5  - last HD 10/2, left under at 65.2kg  Assessment/Plan: 69 y.o. year-old w/ hx of ESRD (Goodpasture's), cirrhosis, HTN, DM2  S/p TIPS procedure. Admit with AMS and pulm edema.   1. Pulm edema on CXR--. not symptomatic, most likely vol overload in esrd pt. Only tolerated 1L UF yesterday. Well below OP dry weight. Lower at discharge.  2. ESRD - on HD TTS.  Has not missed HD recently. Had extra HD 10/4. HD on schedule today.  3. AMS - hx recent TIPS, prob d/t cirrhosis.  B/Cr not sig ^^.  4. Cirrhosis - sp TIPS last week at [facility] 5. HTN - cont meds 6. IDDM - pre primary team 7. Anemia ckd - Hb 9.4, esa w/ HD tomorrow  Lynnda Child PA-C [name] Pager [phone] 05/09/2020,9:53 AM

## 2020-05-09 NOTE — Progress Notes (Signed)
Patient ID: William [name], male   DOB: 11-24-1950, 69 y.o.   MRN: [id number]  PROGRESS NOTE    William [name]  OBS:[id number] DOB: 03-Apr-1951 DOA: 05/08/2020 PCP: Horald Pollen, MD   Brief Narrative:  69 y.o. male with medical history significant of Goodpasture's disease, end-stage renal disease on hemodialysis, cirrhosis of liver, chronic anemia, thrombocytopenia, hypertension, diabetes mellitus type II, recent admission to [facility] ICU on 04/16/2020 for GI bleeding for which he underwent upper GI endoscopy which showed 1 large ulcerated gastric varix/portal hypertensive gastropathy/grade 1 esophageal varices/gastric varices for which patient was transferred to [facility] where he underwent TIPS/CARTO (transvenous obliteration and coagulation of gastric varices and gastrojejunal shunt) procedure on 04/20/2020 and was subsequently discharged on 04/27/2020 followed by admission at [facility] from 05/02/2020-05/04/2020 for hepatic encephalopathy treated with oral lactulose presented again on 05/08/2020 with altered mental status and was found to have ammonia of 119 with chest x-ray showing pulmonary edema.  He was started on lactulose.  Nephrology was consulted.  Assessment & Plan:   Recurrent hepatic encephalopathy decompensated cirrhosis of liver and recent TIPS/CARTO at [facility] -Patient is apparently on transplant list at [facility]. -Patient apparently refused oral meds last night.  Encouraged the son at bedside to try and convince his father to take oral lactulose.  If not, will have to use rectal lactulose -Continue rifaximin. -Mental status not significantly improved yet. -Fall precautions. -PT eval -Palliative care consultation for goals of care discussion  End-stage renal disease on hemodialysis History of Goodpasture syndrome -Nephrology following.  Dialysis as per nephrology schedule  Diabetes mellitus type 2 uncontrolled with hyperglycemia.  Continue Lantus along with CBGs with  SSI  Hypertension -Continue amlodipine, Coreg  Anemia of chronic disease -From renal failure.  Hemoglobin stable  Chronic thrombocytopenia -Platelets stable.  Monitor  Chronic diastolic CHF -Volume managed by dialysis  Generalized deconditioning -PT eval   DVT prophylaxis: SCDs Code Status: Full Family Communication: Son at bedside Disposition Plan: Status is: Inpatient  Remains inpatient appropriate because:Inpatient level of care appropriate due to severity of illness   Dispo: The patient is from: Home              Anticipated d/c is to: Home              Anticipated d/c date is: 1 day              Patient currently is not medically stable to d/c.  Consultants: Nephrology  Procedures: None  Antimicrobials: None   Subjective: Patient seen and examined at bedside.  Son at bedside provides history.  Patient apparently did not take his oral meds last night as per the nursing staff.  Encouraged the son to asked the patient to take oral meds.  No overnight fever or vomiting reported by nursing staff.  Objective: Vitals:   05/08/20 2200 05/08/20 2230 05/08/20 2300 05/09/20 0432  BP: (!) 104/53 (!) 105/58 (!) 134/42 (!) 112/58  Pulse: 73 78 89 76  Resp:    18  Temp:   98.9 F (37.2 C) 97.8 F (36.6 C)  TempSrc:   Oral Oral  SpO2:   98% 98%  Weight:   64.9 kg     Intake/Output Summary (Last 24 hours) at 05/09/2020 1110 Last data filed at 05/08/2020 2300 Gross per 24 hour  Intake --  Output 1000 ml  Net -1000 ml   Filed Weights   05/08/20 1930 05/08/20 2300  Weight: 66.5 kg 64.9 kg  Examination:  General exam: Appears calm and comfortable.  Looks chronically ill.  Sleepy, wakes up slightly, Respiratory system: Bilateral decreased breath sounds at bases with some scattered crackles Cardiovascular system: S1 & S2 heard, Rate controlled Gastrointestinal system: Abdomen is distended, soft and nontender. Normal bowel sounds heard. Extremities: No cyanosis,  clubbing; trace lower extremity edema present Central nervous system:Sleepy, wakes up slightly. Hardly answers any questions.  No focal neurological deficits. Moving extremities Skin: No obvious ecchymosis/lesions Psychiatry: Could not be assessed because of mental status    Data Reviewed: I have personally reviewed following labs and imaging studies  CBC: Recent Labs  Lab 05/02/20 1208 05/02/20 1208 05/03/20 0732 05/04/20 0841 05/08/20 0042 05/08/20 0749 05/09/20 0401  WBC 4.6  --  3.5* 3.8* 4.2  --  4.8  NEUTROABS  --   --   --   --  2.6  --  3.2  HGB 9.8*   < > 8.9* 9.4* 10.5* 9.5* 9.2*  HCT 30.0*   < > 27.9* 29.8* 32.7* 28.0* 28.7*  MCV 83.8  --  84.8 84.7 86.1  --  82.7  PLT 57*  --  60* 68* 67*  --  50*   < > = values in this interval not displayed.   Basic Metabolic Panel: Recent Labs  Lab 05/02/20 1208 05/02/20 1208 05/03/20 0732 05/04/20 0841 05/08/20 0042 05/08/20 0749 05/09/20 0401  NA 136   < > 137 139 137 136 134*  K 4.4   < > 4.2 3.8 3.8 4.3 3.2*  CL 100  --  100 102 101  --  96*  CO2 22  --  23 24 24   --  26  GLUCOSE 205*  --  310* 199* 176*  --  189*  BUN 17  --  26* 31* 23  --  11  CREATININE 3.90*  --  5.58* 7.31* 5.83*  --  3.39*  CALCIUM 9.3  --  8.8* 9.0 9.6  --  8.4*  PHOS  --   --   --  3.7  --   --   --    < > = values in this interval not displayed.   GFR: Estimated Creatinine Clearance: 18.1 mL/min (A) (by C-G formula based on SCr of 3.39 mg/dL (H)). Liver Function Tests: Recent Labs  Lab 05/02/20 1208 05/03/20 0732 05/04/20 0841 05/08/20 0042 05/09/20 0401  AST 34 27 27 30 26   ALT 23 22 20 15 14   ALKPHOS 100 85 93 103 88  BILITOT 2.0* 2.0* 2.0* 1.8* 1.9*  PROT 6.3* 6.0* 6.4* 6.8 6.0*  ALBUMIN 2.4* 2.3* 2.4* 2.5* 2.2*   No results for input(s): LIPASE, AMYLASE in the last 168 hours. Recent Labs  Lab 05/02/20 1208 05/03/20 0732 05/08/20 0101 05/09/20 0401  AMMONIA 67* 40* 119* 53*   Coagulation Profile: Recent Labs    Lab 05/02/20 1208 05/08/20 0042  INR 1.2 1.2   Cardiac Enzymes: No results for input(s): CKTOTAL, CKMB, CKMBINDEX, TROPONINI in the last 168 hours. BNP (last 3 results) No results for input(s): PROBNP in the last 8760 hours. HbA1C: No results for input(s): HGBA1C in the last 72 hours. CBG: Recent Labs  Lab 05/04/20 1643 05/08/20 0101 05/08/20 0747 05/08/20 1136 05/08/20 1721  GLUCAP 178* 169* 225* 186* 160*   Lipid Profile: No results for input(s): CHOL, HDL, LDLCALC, TRIG, CHOLHDL, LDLDIRECT in the last 72 hours. Thyroid Function Tests: No results for input(s): TSH, T4TOTAL, FREET4, T3FREE, THYROIDAB in the last 72 hours.  Anemia Panel: No results for input(s): VITAMINB12, FOLATE, FERRITIN, TIBC, IRON, RETICCTPCT in the last 72 hours. Sepsis Labs: Recent Labs  Lab 05/08/20 0102  LATICACIDVEN 2.5*    Recent Results (from the past 240 hour(s))  Respiratory Panel by RT PCR (Flu A&B, Covid) - Nasopharyngeal Swab     Status: None   Collection Time: 05/02/20  4:45 PM   Specimen: Nasopharyngeal Swab  Result Value Ref Range Status   SARS Coronavirus 2 by RT PCR NEGATIVE NEGATIVE Final    Comment: (NOTE) SARS-CoV-2 target nucleic acids are NOT DETECTED.  The SARS-CoV-2 RNA is generally detectable in upper respiratoy specimens during the acute phase of infection. The lowest concentration of SARS-CoV-2 viral copies this assay can detect is 131 copies/mL. A negative result does not preclude SARS-Cov-2 infection and should not be used as the sole basis for treatment or other patient management decisions. A negative result may occur with  improper specimen collection/handling, submission of specimen other than nasopharyngeal swab, presence of viral mutation(s) within the areas targeted by this assay, and inadequate number of viral copies (<131 copies/mL). A negative result must be combined with clinical observations, patient history, and epidemiological information.  The expected result is Negative.  Fact Sheet for Patients:  PinkCheek.be  Fact Sheet for Healthcare Providers:  GravelBags.it  This test is no t yet approved or cleared by the [place] FDA and  has been authorized for detection and/or diagnosis of SARS-CoV-2 by FDA under an Emergency Use Authorization (EUA). This EUA will remain  in effect (meaning this test can be used) for the duration of the COVID-19 declaration under Section 564(b)(1) of the Act, 21 U.S.C. section 360bbb-3(b)(1), unless the authorization is terminated or revoked sooner.     Influenza A by PCR NEGATIVE NEGATIVE Final   Influenza B by PCR NEGATIVE NEGATIVE Final    Comment: (NOTE) The Xpert Xpress SARS-CoV-2/FLU/RSV assay is intended as an aid in  the diagnosis of influenza from Nasopharyngeal swab specimens and  should not be used as a sole basis for treatment. Nasal washings and  aspirates are unacceptable for Xpert Xpress SARS-CoV-2/FLU/RSV  testing.  Fact Sheet for Patients: PinkCheek.be  Fact Sheet for Healthcare Providers: GravelBags.it  This test is not yet approved or cleared by the [place] FDA and  has been authorized for detection and/or diagnosis of SARS-CoV-2 by  FDA under an Emergency Use Authorization (EUA). This EUA will remain  in effect (meaning this test can be used) for the duration of the  Covid-19 declaration under Section 564(b)(1) of the Act, 21  U.S.C. section 360bbb-3(b)(1), unless the authorization is  terminated or revoked. Performed at [facility], [place] [address], [place], [place] [address]   Respiratory Panel by RT PCR (Flu A&B, Covid) - Nasopharyngeal Swab     Status: None   Collection Time: 05/08/20  2:38 AM   Specimen: Nasopharyngeal Swab  Result Value Ref Range Status   SARS Coronavirus 2 by RT PCR NEGATIVE NEGATIVE Final    Comment:  (NOTE) SARS-CoV-2 target nucleic acids are NOT DETECTED.  The SARS-CoV-2 RNA is generally detectable in upper respiratoy specimens during the acute phase of infection. The lowest concentration of SARS-CoV-2 viral copies this assay can detect is 131 copies/mL. A negative result does not preclude SARS-Cov-2 infection and should not be used as the sole basis for treatment or other patient management decisions. A negative result may occur with  improper specimen collection/handling, submission of specimen other than nasopharyngeal swab,  presence of viral mutation(s) within the areas targeted by this assay, and inadequate number of viral copies (<131 copies/mL). A negative result must be combined with clinical observations, patient history, and epidemiological information. The expected result is Negative.  Fact Sheet for Patients:  PinkCheek.be  Fact Sheet for Healthcare Providers:  GravelBags.it  This test is no t yet approved or cleared by the [place] FDA and  has been authorized for detection and/or diagnosis of SARS-CoV-2 by FDA under an Emergency Use Authorization (EUA). This EUA will remain  in effect (meaning this test can be used) for the duration of the COVID-19 declaration under Section 564(b)(1) of the Act, 21 U.S.C. section 360bbb-3(b)(1), unless the authorization is terminated or revoked sooner.     Influenza A by PCR NEGATIVE NEGATIVE Final   Influenza B by PCR NEGATIVE NEGATIVE Final    Comment: (NOTE) The Xpert Xpress SARS-CoV-2/FLU/RSV assay is intended as an aid in  the diagnosis of influenza from Nasopharyngeal swab specimens and  should not be used as a sole basis for treatment. Nasal washings and  aspirates are unacceptable for Xpert Xpress SARS-CoV-2/FLU/RSV  testing.  Fact Sheet for Patients: PinkCheek.be  Fact Sheet for Healthcare  Providers: GravelBags.it  This test is not yet approved or cleared by the [place] FDA and  has been authorized for detection and/or diagnosis of SARS-CoV-2 by  FDA under an Emergency Use Authorization (EUA). This EUA will remain  in effect (meaning this test can be used) for the duration of the  Covid-19 declaration under Section 564(b)(1) of the Act, 21  U.S.C. section 360bbb-3(b)(1), unless the authorization is  terminated or revoked. Performed at [facility], [place] [address], [place], [place] [address]          Radiology Studies: DG Chest 1 View  Result Date: 05/08/2020 CLINICAL DATA:  Altered level consciousness. EXAM: CHEST  1 VIEW COMPARISON:  Chest x-ray dated 05/02/2020 FINDINGS: There is cardiomegaly with vascular congestion. There is suggestion of hazy perihilar airspace opacities bilaterally. There is no pneumothorax. Aortic calcifications are noted. There are new metallic coils in the left upper quadrant in addition to radiopaque material. The patient appears to have undergone interval TIPS placement. IMPRESSION: 1. Cardiomegaly with vascular congestion and developing interstitial edema. 2. Interval placement of a transjugular intrahepatic portosystemic shunt. Interval CARTO of the previously demonstrated gastric varices. 3. In the setting of new altered level of consciousness and a recently placed TIPS, consider further evaluation of the patient's ammonia level. Electronically Signed   By: Constance Holster M.D.   On: 05/08/2020 01:18        Scheduled Meds: . amLODipine  5 mg Oral Daily  . calcium acetate  667 mg Oral TID WC  . carvedilol  25 mg Oral BID WC  . Chlorhexidine Gluconate Cloth  6 each Topical Q0600  . darbepoetin (ARANESP) injection - DIALYSIS  60 mcg Intravenous Q Tue-HD  . insulin aspart  0-9 Units Subcutaneous TID WC  . insulin glargine  10 Units Subcutaneous Daily  . lactulose  30 g Oral TID  . multivitamin  with minerals  1 tablet Oral Daily  . pantoprazole  40 mg Oral BID  . rifaximin  550 mg Oral Daily  . rosuvastatin  5 mg Oral Daily   Continuous Infusions:        Aline August, MD [facility] 05/09/2020, 11:10 AM

## 2020-05-09 NOTE — Progress Notes (Signed)
PT Cancellation Note  Patient Details Name: William [name] MRN: [id number] DOB: 06-23-1951   Cancelled Treatment:    Reason Eval/Treat Not Completed: Patient at procedure or test/unavailable Pt at HD. Will follow.   Marguarite Arbour A Taisley Mordan 05/09/2020, 2:20 PM Marisa Severin, PT, DPT Acute Rehabilitation Services Pager [phone] Office [phone]

## 2020-05-10 DIAGNOSIS — I5032 Chronic diastolic (congestive) heart failure: Secondary | ICD-10-CM

## 2020-05-10 DIAGNOSIS — I10 Essential (primary) hypertension: Secondary | ICD-10-CM | POA: Diagnosis not present

## 2020-05-10 DIAGNOSIS — R4182 Altered mental status, unspecified: Secondary | ICD-10-CM | POA: Diagnosis not present

## 2020-05-10 DIAGNOSIS — N186 End stage renal disease: Secondary | ICD-10-CM | POA: Diagnosis not present

## 2020-05-10 DIAGNOSIS — K7460 Unspecified cirrhosis of liver: Secondary | ICD-10-CM | POA: Diagnosis not present

## 2020-05-10 DIAGNOSIS — K7290 Hepatic failure, unspecified without coma: Secondary | ICD-10-CM | POA: Diagnosis not present

## 2020-05-10 LAB — CBC WITH DIFFERENTIAL/PLATELET
Abs Immature Granulocytes: 0.01 10*3/uL (ref 0.00–0.07)
Basophils Absolute: 0 10*3/uL (ref 0.0–0.1)
Basophils Relative: 1 %
Eosinophils Absolute: 0.1 10*3/uL (ref 0.0–0.5)
Eosinophils Relative: 4 %
HCT: 30.7 % — ABNORMAL LOW (ref 39.0–52.0)
Hemoglobin: 9.6 g/dL — ABNORMAL LOW (ref 13.0–17.0)
Immature Granulocytes: 0 %
Lymphocytes Relative: 18 %
Lymphs Abs: 0.6 10*3/uL — ABNORMAL LOW (ref 0.7–4.0)
MCH: 26.2 pg (ref 26.0–34.0)
MCHC: 31.3 g/dL (ref 30.0–36.0)
MCV: 83.9 fL (ref 80.0–100.0)
Monocytes Absolute: 0.9 10*3/uL (ref 0.1–1.0)
Monocytes Relative: 25 %
Neutro Abs: 1.7 10*3/uL (ref 1.7–7.7)
Neutrophils Relative %: 52 %
Platelets: 44 10*3/uL — ABNORMAL LOW (ref 150–400)
RBC: 3.66 MIL/uL — ABNORMAL LOW (ref 4.22–5.81)
RDW: 17 % — ABNORMAL HIGH (ref 11.5–15.5)
WBC: 3.4 10*3/uL — ABNORMAL LOW (ref 4.0–10.5)
nRBC: 0 % (ref 0.0–0.2)

## 2020-05-10 LAB — COMPREHENSIVE METABOLIC PANEL
ALT: 14 U/L (ref 0–44)
AST: 27 U/L (ref 15–41)
Albumin: 2.8 g/dL — ABNORMAL LOW (ref 3.5–5.0)
Alkaline Phosphatase: 88 U/L (ref 38–126)
Anion gap: 14 (ref 5–15)
BUN: 10 mg/dL (ref 8–23)
CO2: 24 mmol/L (ref 22–32)
Calcium: 7.9 mg/dL — ABNORMAL LOW (ref 8.9–10.3)
Chloride: 99 mmol/L (ref 98–111)
Creatinine, Ser: 3.11 mg/dL — ABNORMAL HIGH (ref 0.61–1.24)
GFR calc non Af Amer: 20 mL/min — ABNORMAL LOW (ref >60)
Glucose, Bld: 303 mg/dL — ABNORMAL HIGH (ref 70–99)
Potassium: 3.1 mmol/L — ABNORMAL LOW (ref 3.5–5.1)
Sodium: 137 mmol/L (ref 135–145)
Total Bilirubin: 1.8 mg/dL — ABNORMAL HIGH (ref 0.3–1.2)
Total Protein: 6.2 g/dL — ABNORMAL LOW (ref 6.5–8.1)

## 2020-05-10 LAB — AMMONIA: Ammonia: 54 umol/L — ABNORMAL HIGH (ref 9–35)

## 2020-05-10 LAB — GLUCOSE, CAPILLARY
Glucose-Capillary: 408 mg/dL — ABNORMAL HIGH (ref 70–99)
Glucose-Capillary: 426 mg/dL — ABNORMAL HIGH (ref 70–99)
Glucose-Capillary: 178 mg/dL — ABNORMAL HIGH (ref 70–99)

## 2020-05-10 LAB — HEPATITIS B SURFACE ANTIBODY, QUANTITATIVE: Hep B S AB Quant (Post): 97.8 m[IU]/mL (ref >9.9)

## 2020-05-10 MED ORDER — INSULIN ASPART 100 UNIT/ML ~~LOC~~ SOLN
5.0000 [IU] | Freq: Once | SUBCUTANEOUS | Status: AC
  Administered 2020-05-10: 5 [IU] via SUBCUTANEOUS

## 2020-05-10 NOTE — Progress Notes (Signed)
CRITICAL VALUE ALERT  Critical Value: CBG: 426  Date & Time Notied: 05/10/20 1815  Provider Notified: Starla Link, MD   Orders Received/Actions taken: Order placed for 5 units of Novolog once.

## 2020-05-10 NOTE — Evaluation (Signed)
Physical Therapy Evaluation Patient Details Name: William [name] MRN: [id number] DOB: June 20, 1951 Today's Date: 05/10/2020   History of Present Illness  Patient is a 69 y.o. male who presents with AMS. Recent admission 9/12-9/15 with GI bleeding, tx to [facility] and underwent TIPS/CARTO procedures 9/16 and 9/28-10/1 for hepatic encephalopathy. CXR- vascular congestion and pulmonary edema. Admitted again with hepatic encephalopathy. PMH includes Goodpasture's disease, ESRD on dialysis, cirrhosis, chronic anemia, pancytopenia, HTN and DM2.  Clinical Impression   Pt admitted with above diagnosis. Comes from home where he lives with family in a two story house (full bath upstairs); Independent at baseline; Presents to PT with Postural Hypotension; Appeared very unsteady coming out of the bathroom upon my arrival to the room, which prompted the decision to obtain orthostatic BPs; Significant SBP drop from supine to standing at 3 minutes, and pt was requesting to sit because he was feeling weak and tired;  Pt currently with functional limitations due to the deficits listed below (see PT Problem List). Pt will benefit from skilled PT to increase their independence and safety with mobility to allow discharge to the venue listed below.      05/10/20 1248  Vital Signs  Patient Position (if appropriate) Orthostatic Vitals  Orthostatic Lying   BP- Lying 147/58 (MAP 83)  Pulse- Lying 71  Orthostatic Sitting  BP- Sitting 126/53 (MAP 72)  Pulse- Sitting 71  Orthostatic Standing at 0 minutes  BP- Standing at 0 minutes 90/46 (MAP 60)  Pulse- Standing at 0 minutes 72  Orthostatic Standing at 3 minutes  BP- Standing at 3 minutes (!) 80/52 (MAP 52)  Pulse- Standing at 3 minutes 75        Follow Up Recommendations No PT follow up (though will consider HHPT if orthostasis persists)    Equipment Recommendations  Other (comment) (will monitor; likely will not need equipment)    Recommendations for Other  Services       Precautions / Restrictions Precautions Precautions: Fall Precaution Comments: Monitor orthostatics      Mobility  Bed Mobility Overal bed mobility: Needs Assistance Bed Mobility: Supine to Sit;Sit to Supine     Supine to sit: Supervision Sit to supine: Supervision   General bed mobility comments: Cues to self-monitor for activity tolerance; overall little difficulty  Transfers Overall transfer level: Needs assistance Equipment used: None Transfers: Sit to/from Stand Sit to Stand: Supervision         General transfer comment: Cues to self-monitor for activity tolerance  Ambulation/Gait             General Gait Details: Held progressive amb due to BP drop in standing  Stairs            Wheelchair Mobility    Modified Rankin (Stroke Patients Only)       Balance Overall balance assessment:  (Unsteady with incr time in upright standing)                                           Pertinent Vitals/Pain Pain Assessment: No/denies pain    Home Living Family/patient expects to be discharged to:: Private residence Living Arrangements: Spouse/significant other;Children Available Help at Discharge: Family Type of Home: House Home Access: Stairs to enter       Home Equipment: None      Prior Function Level of Independence: Independent  Hand Dominance        Extremity/Trunk Assessment   Upper Extremity Assessment Upper Extremity Assessment: Generalized weakness    Lower Extremity Assessment Lower Extremity Assessment: Generalized weakness       Communication   Communication: Prefers language other than English (Son translates)  Cognition Arousal/Alertness: Awake/alert Behavior During Therapy: WFL for tasks assessed/performed Overall Cognitive Status: Within Functional Limits for tasks assessed                                        General Comments General comments  (skin integrity, edema, etc.): See Clinical Impression    Exercises     Assessment/Plan    PT Assessment Patient needs continued PT services  PT Problem List Decreased strength;Decreased activity tolerance;Decreased balance;Decreased knowledge of use of DME;Cardiopulmonary status limiting activity       PT Treatment Interventions DME instruction;Gait training;Stair training;Functional mobility training;Therapeutic activities;Therapeutic exercise;Balance training;Patient/family education    PT Goals (Current goals can be found in the Care Plan section)  Acute Rehab PT Goals Patient Stated Goal: Did not specifically state PT Goal Formulation: With patient Time For Goal Achievement: 05/24/20 Potential to Achieve Goals: Good    Frequency Min 3X/week   Barriers to discharge        Co-evaluation               AM-PAC PT "6 Clicks" Mobility  Outcome Measure Help needed turning from your back to your side while in a flat bed without using bedrails?: None Help needed moving from lying on your back to sitting on the side of a flat bed without using bedrails?: None Help needed moving to and from a bed to a chair (including a wheelchair)?: None Help needed standing up from a chair using your arms (e.g., wheelchair or bedside chair)?: A Little Help needed to walk in hospital room?: A Little Help needed climbing 3-5 steps with a railing? : A Little 6 Click Score: 21    End of Session   Activity Tolerance: Patient tolerated treatment well Patient left: in bed;with call bell/phone within reach;with family/visitor present Nurse Communication: Mobility status PT Visit Diagnosis: Other abnormalities of gait and mobility (R26.89)    Time: 2174-7159 PT Time Calculation (min) (ACUTE ONLY): 31 min   Charges:   PT Evaluation $PT Eval Low Complexity: 1 Low PT Treatments $Therapeutic Activity: 8-22 mins        Roney Marion, PT  Acute Rehabilitation Services Pager  [phone] Office [phone]   Colletta [place] 05/10/2020, 2:03 PM

## 2020-05-10 NOTE — Progress Notes (Signed)
[facility] Progress Note   Subjective:   Seen in room. Son at bedside, interpreting. Completed dialysis yesterday with another 1.9L removed. Feeling better. He is alert, interactive this am. Denies cp, sob.  Per son may discharge tomorrow.   Objective Vitals:   05/09/20 1635 05/09/20 1637 05/10/20 0510 05/10/20 0932  BP: (!) 108/52 (!) 114/50 (!) 137/56 (!) 128/55  Pulse:   73 76  Resp:  16 17 18   Temp:  (!) 97.5 F (36.4 C) 98.4 F (36.9 C) 98 F (36.7 C)  TempSrc:  Oral Oral Oral  SpO2:  98% 96% 98%  Weight:  62.3 kg      Additional Objective Labs: Basic Metabolic Panel: Recent Labs  Lab 05/04/20 0841 05/04/20 0841 05/08/20 0042 05/08/20 0042 05/08/20 0749 05/09/20 0401 05/10/20 0156  NA 139   < > 137   < > 136 134* 137  K 3.8   < > 3.8   < > 4.3 3.2* 3.1*  CL 102   < > 101  --   --  96* 99  CO2 24   < > 24  --   --  26 24  GLUCOSE 199*   < > 176*  --   --  189* 303*  BUN 31*   < > 23  --   --  11 10  CREATININE 7.31*   < > 5.83*  --   --  3.39* 3.11*  CALCIUM 9.0   < > 9.6  --   --  8.4* 7.9*  PHOS 3.7  --   --   --   --   --   --    < > = values in this interval not displayed.   CBC: Recent Labs  Lab 05/04/20 0841 05/04/20 0841 05/08/20 0042 05/08/20 0042 05/08/20 0749 05/09/20 0401 05/10/20 0156  WBC 3.8*   < > 4.2  --   --  4.8 3.4*  NEUTROABS  --   --  2.6  --   --  3.2 1.7  HGB 9.4*   < > 10.5*   < > 9.5* 9.2* 9.6*  HCT 29.8*   < > 32.7*   < > 28.0* 28.7* 30.7*  MCV 84.7  --  86.1  --   --  82.7 83.9  PLT 68*   < > 67*  --   --  50* 44*   < > = values in this interval not displayed.   Blood Culture    Component Value Date/Time   SDES URINE, CLEAN CATCH 04/18/2020 0118   SPECREQUEST  04/18/2020 0118    NONE Performed at [facility] [address], [place], [place] [address]    CULT (A) 04/18/2020 0118    70,000 COLONIES/mL ESCHERICHIA COLI Confirmed Extended Spectrum Beta-Lactamase Producer (ESBL).  In bloodstream  infections from ESBL organisms, carbapenems are preferred over piperacillin/tazobactam. They are shown to have a lower risk of mortality.    REPTSTATUS 04/19/2020 FINAL 04/18/2020 0118     Physical Exam General: WNWD man, nad, Heart: RRR S1,SE Lungs: Clear bilaterally, breathing unlabored on RA Abdomen: soft non-tender Extremities: No LE edema  Dialysis Access: LUE AVF +bruit   Medications:  . amLODipine  5 mg Oral Daily  . calcium acetate  667 mg Oral TID WC  . carvedilol  25 mg Oral BID WC  . Chlorhexidine Gluconate Cloth  6 each Topical Q0600  . darbepoetin (ARANESP) injection - DIALYSIS  60 mcg Intravenous Q  Tue-HD  . insulin aspart  0-9 Units Subcutaneous TID WC  . insulin glargine  10 Units Subcutaneous Daily  . lactulose  30 g Oral TID  . multivitamin with minerals  1 tablet Oral Daily  . pantoprazole  40 mg Oral BID  . rifaximin  550 mg Oral Daily  . rosuvastatin  5 mg Oral Daily    Dialysis Orders:  AF TTS 4h  67.5kg  2/3.5Ca  L  AVF  Hep none    - mircera 60 q2 , to start 10/5  - last HD 10/2, left under at 65.2kg  Assessment/Plan: 69 y.o. year-old w/ hx of ESRD (Goodpasture's), cirrhosis, HTN, DM2  S/p TIPS procedure. Admit with AMS and concern for pulm edema.   1. Bilat airspace opacities/concern for Pulm edema on CXR--. not symptomatic, not fitting clinical picture of volume overload.  CT chest unremarkable.  Is now below OP dry weight. Lower at discharge.  2. ESRD - on HD TTS.  Has not missed HD recently. Had HD 10/4, 105. Back on schedule next HD 10/7.  3. AMS - hx recent TIPS, prob d/t cirrhosis.  B/Cr not sig ^^. Improving.  4. Cirrhosis - sp TIPS last week at [facility] 5. HTN - cont meds 6. IDDM - pre primary team 7. Anemia ckd - Hb 9.4. Aranesp 60 given 10/5.  8. Dispo: Possible discharge tomorrow. Family request to have HD in hospital tomorrow. Will write HD orders 1st shift.   Lynnda Child PA-C [facility] 05/10/2020,12:00  PM

## 2020-05-10 NOTE — Progress Notes (Signed)
Patient ID: [name], male   DOB: Sep 21, 1950, 68 y.o.   MRN: [id number]  PROGRESS NOTE    Raffael Bugarin  UPJ:[id number] DOB: 1951/07/17 DOA: 05/08/2020 PCP: Horald Pollen, MD   Brief Narrative:  69 y.o. male with medical history significant of Goodpasture's disease, end-stage renal disease on hemodialysis, cirrhosis of liver, chronic anemia, thrombocytopenia, hypertension, diabetes mellitus type II, recent admission to [facility] ICU on 04/16/2020 for GI bleeding for which he underwent upper GI endoscopy which showed 1 large ulcerated gastric varix/portal hypertensive gastropathy/grade 1 esophageal varices/gastric varices for which patient was transferred to [facility] where he underwent TIPS/CARTO (transvenous obliteration and coagulation of gastric varices and gastrojejunal shunt) procedure on 04/20/2020 and was subsequently discharged on 04/27/2020 followed by admission at [facility] from 05/02/2020-05/04/2020 for hepatic encephalopathy treated with oral lactulose presented again on 05/08/2020 with altered mental status and was found to have ammonia of 119 with chest x-ray showing pulmonary edema.  He was started on lactulose.  Nephrology was consulted.  Assessment & Plan:   Recurrent hepatic encephalopathy decompensated cirrhosis of liver and recent TIPS/CARTO at [facility] -Patient is apparently on transplant list at [facility]. -Continue lactulose and rifaximin -Mental status not significantly improved yet. -Fall precautions. -PT eval pending -Palliative care consultation for goals of care discussion because of recurrent admissions recently and overall poor prognosis.  End-stage renal disease on hemodialysis History of Goodpasture syndrome -Nephrology following.  Dialysis as per nephrology schedule  Diabetes mellitus type 2 uncontrolled with hyperglycemia -Continue Lantus along with CBGs with SSI  Hypertension -Continue amlodipine, Coreg  Anemia of chronic disease -From renal  failure.  Hemoglobin stable  Chronic thrombocytopenia -Platelets stable.  Monitor  Chronic diastolic CHF -Volume managed by dialysis  Generalized deconditioning -PT eval pending   DVT prophylaxis: SCDs Code Status: Full Family Communication: Son at bedside Disposition Plan: Status is: Inpatient  Remains inpatient appropriate because:Inpatient level of care appropriate due to severity of illness   Dispo: The patient is from: Home              Anticipated d/c is to: Home              Anticipated d/c date is: 1 day              Patient currently is not medically stable to d/c.  Consultants: Nephrology/palliative care  Procedures: None  Antimicrobials: None   Subjective: Patient seen and examined at bedside.  Son at bedside provides history.  Patient is a very poor historian.  Wakes up slightly; does not participate in conversation much.  Feels better this morning.  No overnight fever, vomiting reported.  No worsening abdominal pain. Objective: Vitals:   05/09/20 1620 05/09/20 1635 05/09/20 1637 05/10/20 0510  BP: (!) 84/47 (!) 108/52 (!) 114/50 (!) 137/56  Pulse:    73  Resp:   16 17  Temp:   (!) 97.5 F (36.4 C) 98.4 F (36.9 C)  TempSrc:   Oral Oral  SpO2:   98% 96%  Weight:   62.3 kg     Intake/Output Summary (Last 24 hours) at 05/10/2020 0723 Last data filed at 05/10/2020 0600 Gross per 24 hour  Intake 0 ml  Output 1928 ml  Net -1928 ml   Filed Weights   05/08/20 2300 05/09/20 1330 05/09/20 1637  Weight: 64.9 kg 64 kg 62.3 kg    Examination:  General exam: No acute distress.  Looks chronically ill.   Respiratory system: Bilateral decreased breath  sounds at bases with some crackles.  No wheezing  cardiovascular system: Rate controlled, S1-S2 heard Gastrointestinal system: Abdomen is slightly distended, soft and nontender.  Bowel sounds are heard  extremities: Trace lower extremity edema present.  No clubbing Central nervous system: Sleepy, wakes up  and answers some questions asked by the son at bedside.  No focal neurological deficits. Moving extremities Skin: No obvious rashes/ulcers Psychiatry: Flat affect  Data Reviewed: I have personally reviewed following labs and imaging studies  CBC: Recent Labs  Lab 05/03/20 0732 05/03/20 0732 05/04/20 0841 05/08/20 0042 05/08/20 0749 05/09/20 0401 05/10/20 0156  WBC 3.5*  --  3.8* 4.2  --  4.8 3.4*  NEUTROABS  --   --   --  2.6  --  3.2 1.7  HGB 8.9*   < > 9.4* 10.5* 9.5* 9.2* 9.6*  HCT 27.9*   < > 29.8* 32.7* 28.0* 28.7* 30.7*  MCV 84.8  --  84.7 86.1  --  82.7 83.9  PLT 60*  --  68* 67*  --  50* 44*   < > = values in this interval not displayed.   Basic Metabolic Panel: Recent Labs  Lab 05/03/20 0732 05/03/20 0732 05/04/20 0841 05/08/20 0042 05/08/20 0749 05/09/20 0401 05/10/20 0156  NA 137   < > 139 137 136 134* 137  K 4.2   < > 3.8 3.8 4.3 3.2* 3.1*  CL 100  --  102 101  --  96* 99  CO2 23  --  24 24  --  26 24  GLUCOSE 310*  --  199* 176*  --  189* 303*  BUN 26*  --  31* 23  --  11 10  CREATININE 5.58*  --  7.31* 5.83*  --  3.39* 3.11*  CALCIUM 8.8*  --  9.0 9.6  --  8.4* 7.9*  PHOS  --   --  3.7  --   --   --   --    < > = values in this interval not displayed.   GFR: Estimated Creatinine Clearance: 19.8 mL/min (A) (by C-G formula based on SCr of 3.11 mg/dL (H)). Liver Function Tests: Recent Labs  Lab 05/03/20 0732 05/04/20 0841 05/08/20 0042 05/09/20 0401 05/10/20 0156  AST 27 27 30 26 27   ALT 22 20 15 14 14   ALKPHOS 85 93 103 88 88  BILITOT 2.0* 2.0* 1.8* 1.9* 1.8*  PROT 6.0* 6.4* 6.8 6.0* 6.2*  ALBUMIN 2.3* 2.4* 2.5* 2.2* 2.8*   No results for input(s): LIPASE, AMYLASE in the last 168 hours. Recent Labs  Lab 05/03/20 0732 05/08/20 0101 05/09/20 0401 05/10/20 0156  AMMONIA 40* 119* 53* 54*   Coagulation Profile: Recent Labs  Lab 05/08/20 0042  INR 1.2   Cardiac Enzymes: No results for input(s): CKTOTAL, CKMB, CKMBINDEX, TROPONINI in  the last 168 hours. BNP (last 3 results) No results for input(s): PROBNP in the last 8760 hours. HbA1C: No results for input(s): HGBA1C in the last 72 hours. CBG: Recent Labs  Lab 05/08/20 0101 05/08/20 0747 05/08/20 1136 05/08/20 1721 05/08/20 2310  GLUCAP 169* 225* 186* 160* 135*   Lipid Profile: No results for input(s): CHOL, HDL, LDLCALC, TRIG, CHOLHDL, LDLDIRECT in the last 72 hours. Thyroid Function Tests: No results for input(s): TSH, T4TOTAL, FREET4, T3FREE, THYROIDAB in the last 72 hours. Anemia Panel: No results for input(s): VITAMINB12, FOLATE, FERRITIN, TIBC, IRON, RETICCTPCT in the last 72 hours. Sepsis Labs: Recent Labs  Lab 05/08/20 0102  LATICACIDVEN 2.5*    Recent Results (from the past 240 hour(s))  Respiratory Panel by RT PCR (Flu A&B, Covid) - Nasopharyngeal Swab     Status: None   Collection Time: 05/02/20  4:45 PM   Specimen: Nasopharyngeal Swab  Result Value Ref Range Status   SARS Coronavirus 2 by RT PCR NEGATIVE NEGATIVE Final    Comment: (NOTE) SARS-CoV-2 target nucleic acids are NOT DETECTED.  The SARS-CoV-2 RNA is generally detectable in upper respiratoy specimens during the acute phase of infection. The lowest concentration of SARS-CoV-2 viral copies this assay can detect is 131 copies/mL. A negative result does not preclude SARS-Cov-2 infection and should not be used as the sole basis for treatment or other patient management decisions. A negative result may occur with  improper specimen collection/handling, submission of specimen other than nasopharyngeal swab, presence of viral mutation(s) within the areas targeted by this assay, and inadequate number of viral copies (<131 copies/mL). A negative result must be combined with clinical observations, patient history, and epidemiological information. The expected result is Negative.  Fact Sheet for Patients:  PinkCheek.be  Fact Sheet for Healthcare Providers:   GravelBags.it  This test is no t yet approved or cleared by the [place] FDA and  has been authorized for detection and/or diagnosis of SARS-CoV-2 by FDA under an Emergency Use Authorization (EUA). This EUA will remain  in effect (meaning this test can be used) for the duration of the COVID-19 declaration under Section 564(b)(1) of the Act, 21 U.S.C. section 360bbb-3(b)(1), unless the authorization is terminated or revoked sooner.     Influenza A by PCR NEGATIVE NEGATIVE Final   Influenza B by PCR NEGATIVE NEGATIVE Final    Comment: (NOTE) The Xpert Xpress SARS-CoV-2/FLU/RSV assay is intended as an aid in  the diagnosis of influenza from Nasopharyngeal swab specimens and  should not be used as a sole basis for treatment. Nasal washings and  aspirates are unacceptable for Xpert Xpress SARS-CoV-2/FLU/RSV  testing.  Fact Sheet for Patients: PinkCheek.be  Fact Sheet for Healthcare Providers: GravelBags.it  This test is not yet approved or cleared by the [place] FDA and  has been authorized for detection and/or diagnosis of SARS-CoV-2 by  FDA under an Emergency Use Authorization (EUA). This EUA will remain  in effect (meaning this test can be used) for the duration of the  Covid-19 declaration under Section 564(b)(1) of the Act, 21  U.S.C. section 360bbb-3(b)(1), unless the authorization is  terminated or revoked. Performed at [facility], [place] [address], [place], [place] [address]   Respiratory Panel by RT PCR (Flu A&B, Covid) - Nasopharyngeal Swab     Status: None   Collection Time: 05/08/20  2:38 AM   Specimen: Nasopharyngeal Swab  Result Value Ref Range Status   SARS Coronavirus 2 by RT PCR NEGATIVE NEGATIVE Final    Comment: (NOTE) SARS-CoV-2 target nucleic acids are NOT DETECTED.  The SARS-CoV-2 RNA is generally detectable in upper respiratoy specimens during the  acute phase of infection. The lowest concentration of SARS-CoV-2 viral copies this assay can detect is 131 copies/mL. A negative result does not preclude SARS-Cov-2 infection and should not be used as the sole basis for treatment or other patient management decisions. A negative result may occur with  improper specimen collection/handling, submission of specimen other than nasopharyngeal swab, presence of viral mutation(s) within the areas targeted by this assay, and inadequate number of viral copies (<131 copies/mL). A negative result must be combined with  clinical observations, patient history, and epidemiological information. The expected result is Negative.  Fact Sheet for Patients:  PinkCheek.be  Fact Sheet for Healthcare Providers:  GravelBags.it  This test is no t yet approved or cleared by the [place] FDA and  has been authorized for detection and/or diagnosis of SARS-CoV-2 by FDA under an Emergency Use Authorization (EUA). This EUA will remain  in effect (meaning this test can be used) for the duration of the COVID-19 declaration under Section 564(b)(1) of the Act, 21 U.S.C. section 360bbb-3(b)(1), unless the authorization is terminated or revoked sooner.     Influenza A by PCR NEGATIVE NEGATIVE Final   Influenza B by PCR NEGATIVE NEGATIVE Final    Comment: (NOTE) The Xpert Xpress SARS-CoV-2/FLU/RSV assay is intended as an aid in  the diagnosis of influenza from Nasopharyngeal swab specimens and  should not be used as a sole basis for treatment. Nasal washings and  aspirates are unacceptable for Xpert Xpress SARS-CoV-2/FLU/RSV  testing.  Fact Sheet for Patients: PinkCheek.be  Fact Sheet for Healthcare Providers: GravelBags.it  This test is not yet approved or cleared by the [place] FDA and  has been authorized for detection and/or diagnosis  of SARS-CoV-2 by  FDA under an Emergency Use Authorization (EUA). This EUA will remain  in effect (meaning this test can be used) for the duration of the  Covid-19 declaration under Section 564(b)(1) of the Act, 21  U.S.C. section 360bbb-3(b)(1), unless the authorization is  terminated or revoked. Performed at [facility], [place] [address] [place], [place] [address]          Radiology Studies: CT CHEST WO CONTRAST  Result Date: 05/09/2020 CLINICAL DATA:  Dyspnea. EXAM: CT CHEST WITHOUT CONTRAST TECHNIQUE: Multidetector CT imaging of the chest was performed following the standard protocol without IV contrast. COMPARISON:  Dec 19, 2017 FINDINGS: Cardiovascular: The heart size is mildly enlarged. The intracardiac blood pool is hypodense relative to the adjacent myocardium consistent with anemia. There are coronary artery calcifications. Mild thoracic aortic calcifications are noted. There is no evidence for thoracic aortic aneurysm. Mediastinum/Nodes: -- No mediastinal lymphadenopathy. -- No hilar lymphadenopathy. -- No axillary lymphadenopathy. -- No supraclavicular lymphadenopathy. -- Normal thyroid gland where visualized. -  Unremarkable esophagus. Lungs/Pleura: Mild emphysematous changes are noted bilaterally. There is some mild interlobular septal thickening bilaterally, especially at the lung bases. The trachea is unremarkable. Upper Abdomen: The liver is cirrhotic. The patient is status post placement of a transjugular intrahepatic portosystemic shunt. The patient is status post prior gastric variceal embolization with STS and coils. The spleen is significantly enlarged as before. The kidneys appear to be somewhat atrophic. Musculoskeletal: No chest wall abnormality. No bony spinal canal stenosis. IMPRESSION: 1. Mild interlobular septal thickening may reflect mild interstitial edema. 2. Cardiomegaly with coronary artery disease. 3. Cirrhosis with sequela of portal hypertension and  postsurgical changes as detailed above. Aortic Atherosclerosis (ICD10-I70.0) and Emphysema (ICD10-J43.9). Electronically Signed   By: Constance Holster M.D.   On: 05/09/2020 19:13        Scheduled Meds: . amLODipine  5 mg Oral Daily  . calcium acetate  667 mg Oral TID WC  . carvedilol  25 mg Oral BID WC  . Chlorhexidine Gluconate Cloth  6 each Topical Q0600  . darbepoetin (ARANESP) injection - DIALYSIS  60 mcg Intravenous Q Tue-HD  . insulin aspart  0-9 Units Subcutaneous TID WC  . insulin glargine  10 Units Subcutaneous Daily  . lactulose  30 g  Oral TID  . multivitamin with minerals  1 tablet Oral Daily  . pantoprazole  40 mg Oral BID  . rifaximin  550 mg Oral Daily  . rosuvastatin  5 mg Oral Daily   Continuous Infusions:        Aline August, MD [facility] 05/10/2020, 7:23 AM

## 2020-05-10 NOTE — Progress Notes (Signed)
Inpatient Diabetes Program Recommendations  AACE/ADA: New Consensus Statement on Inpatient Glycemic Control (2015)  Target Ranges:  Prepandial:   less than 140 mg/dL      Peak postprandial:   less than 180 mg/dL (1-2 hours)      Critically ill patients:  140 - 180 mg/dL   Lab Results  Component Value Date   GLUCAP 135 (H) 05/08/2020   HGBA1C 6.1 (H) 05/02/2020    Review of Glycemic Control  Diabetes history: DM2 Outpatient Diabetes medications: Lantus 10 units, Novolin R 6-8-8 units tidwc Current orders for Inpatient glycemic control: Lantus 10 units QD, Novolog 0-9 units tidwc  HgbA1C - 6.1%  Inpatient Diabetes Program Recommendations:     Add Novolog 3 units tidwc if pt eats > 50% meal  Continue to follow.  Thank you. Lorenda Peck, RD, LDN, CDE Inpatient Diabetes Coordinator [phone]

## 2020-05-11 DIAGNOSIS — N186 End stage renal disease: Secondary | ICD-10-CM | POA: Diagnosis not present

## 2020-05-11 DIAGNOSIS — K7460 Unspecified cirrhosis of liver: Secondary | ICD-10-CM | POA: Diagnosis not present

## 2020-05-11 DIAGNOSIS — R4182 Altered mental status, unspecified: Secondary | ICD-10-CM | POA: Diagnosis not present

## 2020-05-11 DIAGNOSIS — I10 Essential (primary) hypertension: Secondary | ICD-10-CM | POA: Diagnosis not present

## 2020-05-11 DIAGNOSIS — K7290 Hepatic failure, unspecified without coma: Secondary | ICD-10-CM | POA: Diagnosis not present

## 2020-05-11 LAB — CBC WITH DIFFERENTIAL/PLATELET
Abs Immature Granulocytes: 0.01 10*3/uL (ref 0.00–0.07)
Basophils Absolute: 0 10*3/uL (ref 0.0–0.1)
Basophils Relative: 1 %
Eosinophils Absolute: 0.2 10*3/uL (ref 0.0–0.5)
Eosinophils Relative: 5 %
HCT: 30.9 % — ABNORMAL LOW (ref 39.0–52.0)
Hemoglobin: 10.1 g/dL — ABNORMAL LOW (ref 13.0–17.0)
Immature Granulocytes: 0 %
Lymphocytes Relative: 18 %
Lymphs Abs: 0.8 10*3/uL (ref 0.7–4.0)
MCH: 27.7 pg (ref 26.0–34.0)
MCHC: 32.7 g/dL (ref 30.0–36.0)
MCV: 84.7 fL (ref 80.0–100.0)
Monocytes Absolute: 1.2 10*3/uL — ABNORMAL HIGH (ref 0.1–1.0)
Monocytes Relative: 26 %
Neutro Abs: 2.3 10*3/uL (ref 1.7–7.7)
Neutrophils Relative %: 50 %
Platelets: 52 10*3/uL — ABNORMAL LOW (ref 150–400)
RBC: 3.65 MIL/uL — ABNORMAL LOW (ref 4.22–5.81)
RDW: 16.8 % — ABNORMAL HIGH (ref 11.5–15.5)
WBC: 4.5 10*3/uL (ref 4.0–10.5)
nRBC: 0 % (ref 0.0–0.2)

## 2020-05-11 LAB — AMMONIA: Ammonia: 69 umol/L — ABNORMAL HIGH (ref 9–35)

## 2020-05-11 LAB — COMPREHENSIVE METABOLIC PANEL
ALT: 14 U/L (ref 0–44)
AST: 29 U/L (ref 15–41)
Albumin: 2.8 g/dL — ABNORMAL LOW (ref 3.5–5.0)
Alkaline Phosphatase: 95 U/L (ref 38–126)
Anion gap: 14 (ref 5–15)
BUN: 17 mg/dL (ref 8–23)
CO2: 24 mmol/L (ref 22–32)
Calcium: 8.5 mg/dL — ABNORMAL LOW (ref 8.9–10.3)
Chloride: 100 mmol/L (ref 98–111)
Creatinine, Ser: 4.93 mg/dL — ABNORMAL HIGH (ref 0.61–1.24)
GFR calc non Af Amer: 11 mL/min — ABNORMAL LOW (ref >60)
Glucose, Bld: 98 mg/dL (ref 70–99)
Potassium: 3 mmol/L — ABNORMAL LOW (ref 3.5–5.1)
Sodium: 138 mmol/L (ref 135–145)
Total Bilirubin: 1.2 mg/dL (ref 0.3–1.2)
Total Protein: 6.8 g/dL (ref 6.5–8.1)

## 2020-05-11 LAB — GLUCOSE, CAPILLARY
Glucose-Capillary: 162 mg/dL — ABNORMAL HIGH (ref 70–99)
Glucose-Capillary: 145 mg/dL — ABNORMAL HIGH (ref 70–99)

## 2020-05-11 MED ORDER — LIDOCAINE HCL (PF) 1 % IJ SOLN: 5.0000 mL | INTRAMUSCULAR | Status: DC | PRN

## 2020-05-11 MED ORDER — RIFAXIMIN 550 MG PO TABS: 550.0000 mg | ORAL_TABLET | Freq: Two times a day (BID) | ORAL | 0 refills | Status: AC

## 2020-05-11 MED ORDER — PENTAFLUOROPROP-TETRAFLUOROETH EX AERO: 1.0000 "application " | INHALATION_SPRAY | CUTANEOUS | Status: DC | PRN

## 2020-05-11 MED ORDER — LIDOCAINE-PRILOCAINE 2.5-2.5 % EX CREA: 1.0000 "application " | TOPICAL_CREAM | CUTANEOUS | Status: DC | PRN

## 2020-05-11 MED ORDER — RIFAXIMIN 550 MG PO TABS: 550.0000 mg | ORAL_TABLET | Freq: Every day | ORAL | 0 refills | Status: DC

## 2020-05-11 MED ORDER — CALCIUM ACETATE (PHOS BINDER) 667 MG PO CAPS: 667.0000 mg | ORAL_CAPSULE | Freq: Three times a day (TID) | ORAL | Status: DC

## 2020-05-11 MED ORDER — SODIUM CHLORIDE 0.9 % IV SOLN: 100.0000 mL | INTRAVENOUS | Status: DC | PRN

## 2020-05-11 MED ORDER — LACTULOSE 10 GM/15ML PO SOLN: 30.0000 g | Freq: Three times a day (TID) | ORAL | 1 refills | Status: DC

## 2020-05-11 MED ORDER — ALTEPLASE 2 MG IJ SOLR: 2.0000 mg | Freq: Once | INTRAMUSCULAR | Status: DC | PRN

## 2020-05-11 NOTE — Progress Notes (Signed)
Palliative Medicine RN Note: Note that pt will likely be d/c today before PMT is able to see him (due to high consult volume and the need to set up an interpreter for the visit).   PMT did see him last week, and he wished to continue all aggressive care, and there is a MOST form in the ACP tab with his wishes. If further [place] discussions are desired, we recommend TOC set up OP palliative care to follow him after discharge.   If he remains admitted, and we have a provider become available, we will reach out.  Marjie Skiff Tiffiany Beadles, RN, BSN, [facility] Palliative Medicine Team 05/11/2020 9:31 AM Office [phone]

## 2020-05-11 NOTE — Progress Notes (Signed)
DISCHARGE NOTE HOME William [name] to be discharged home per MD order. Discussed prescriptions and follow up appointments with the patient. Prescriptions given to patient; medication list explained in detail. Patient verbalized understanding.  Skin clean, dry and intact without evidence of skin break down, no evidence of skin tears noted. IV catheter discontinued intact. Site without signs and symptoms of complications. Dressing and pressure applied. Pt denies pain at the site currently. No complaints noted.  Patient free of lines, drains, and wounds.   An After Visit Summary (AVS) was printed and given to the patient. Patient escorted via wheelchair, and discharged home via private auto.  [name], RN

## 2020-05-11 NOTE — Plan of Care (Signed)
  Problem: Nutrition: Goal: Adequate nutrition will be maintained Outcome: Progressing   

## 2020-05-11 NOTE — Plan of Care (Signed)
  Problem: Education: Goal: Knowledge of General Education information will improve Description Including pain rating scale, medication(s)/side effects and non-pharmacologic comfort measures Outcome: Progressing   Problem: Health Behavior/Discharge Planning: Goal: Ability to manage health-related needs will improve Outcome: Progressing   

## 2020-05-11 NOTE — Discharge Summary (Addendum)
Physician Discharge Summary  William [name] VQM:[id number] DOB: 1951/04/29 DOA: 05/08/2020  PCP: Horald Pollen, MD  Admit date: 05/08/2020 Discharge date: 05/11/2020  Admitted From: Home Disposition: Home  Recommendations for Outpatient Follow-up:  1. Follow up with PCP in 1 week 2. Recommend outpatient evaluation and follow-up by palliative care 3. Outpatient follow-up with dialysis unit as scheduled 4. Follow-up with regular GI as scheduled 5. Follow up in ED if symptoms worsen or new appear   Home Health: Home health RN/PT/OT Equipment/Devices: None  Discharge Condition: Guarded to poor CODE STATUS: Full Diet recommendation: Heart healthy/carb modified/renal hemodialysis diet  Brief/Interim Summary: 68 y.o.malewith medical history significant ofGoodpasture's disease, end-stage renal disease on hemodialysis, cirrhosis of liver, chronic anemia, thrombocytopenia, hypertension, diabetes mellitus type II, recent admission to [facility] ICU on 04/16/2020 for GI bleeding for which he underwent upper GI endoscopy which showed 1 large ulcerated gastric varix/portal hypertensive gastropathy/grade 1 esophageal varices/gastric varices for which patient was transferred to [facility] where he underwent TIPS/CARTO(transvenous obliteration and coagulation of gastric varices and gastrojejunal shunt) procedure on 04/20/2020 and was subsequently discharged on 04/27/2020 followed by admission at [facility] from 05/02/2020-05/04/2020 for hepatic encephalopathy treated with oral lactulose presented again on 05/08/2020 with altered mental status and was found to have ammonia of 119 with chest x-ray showing pulmonary edema.  He was started on lactulose.  Nephrology was consulted.  No lateralizing, he has tolerated urinalysis.  He is having bowel movements with lactulose.  His mental status is improving.  Palliative care evaluation is pending, this can happen as an outpatient.  He will be discharged  home with home and PT/OT/RN.  Overall prognosis is very poor.   Discharge Diagnoses:   Recurrent hepatic encephalopathy decompensated cirrhosis of liver and recent TIPS/CARTO at [facility] -Patient is apparently on transplant list at [facility]. -Continue lactulose and rifaximin -Mental status has improved.  He is having good bowel movements.  Explained to the son regarding importance of continuing lactulose to have 3-4 loose bowel movements a day.  If patient starts to have a lot of diarrhea, 1 or 2 doses can be held and then restarted subsequently. -Palliative care consultation is still pending for goals of care discussion because of recurrent admissions recently and overall poor prognosis.  This can happen as an outpatient -Discharge patient home today on lactulose and rifaximin.  Outpatient follow-up with GI as scheduled.  End-stage renal disease on hemodialysis History of Goodpasture syndrome -Nephrology following.  Dialysis as per nephrology schedule.  Nephrology has cleared the patient for discharge with outpatient follow-up with dialysis unit as scheduled.  Diabetes mellitus type 2 uncontrolled with hyperglycemia -Continue carb modified diet.  Continue Lantus  Hypertension -Blood pressure on the lower side today.  DC amlodipine and Coreg for now and this can be resumed as an outpatient for blood pressure becomes an issue.  Anemia of chronic disease -From renal failure.  Hemoglobin stable.  Outpatient follow-up  Chronic thrombocytopenia -Platelets stable.  Monitor  Chronic diastolic CHF -Volume managed by dialysis  Generalized deconditioning -Patient will need home with PT.  Discharge Instructions  Discharge Instructions    Amb Referral to Palliative Care   Complete by: As directed    Continue goals of care discussion   Diet - low sodium heart healthy   Complete by: As directed    Diet Carb Modified   Complete by: As directed    Increase activity slowly   Complete by:  As directed    No wound  care   Complete by: As directed      Allergies as of 05/11/2020      Reactions   Heparin Other (See Comments)   Patient is Muslim and is not permitted Pork derative due to religious belief)   Pork-derived Products Other (See Comments)   Patient does not eat pork due to religious beliefs      Medication List    STOP taking these medications   amLODipine 10 MG tablet Commonly known as: NORVASC   carvedilol 12.5 MG tablet Commonly known as: COREG   cetirizine 5 MG tablet Commonly known as: ZYRTEC   mupirocin ointment 2 % Commonly known as: BACTROBAN     TAKE these medications   calcium acetate 667 MG capsule Commonly known as: PHOSLO Take 1 capsule (667 mg total) by mouth 3 (three) times daily with meals. What changed: how much to take   Insulin Pen Needle 31G X 5 MM Misc Commonly known as: Advocate Insulin Pen Needles Use four times daily to inject insulin   insulin regular 100 units/mL injection Commonly known as: NOVOLIN R Inject 6-8 Units into the skin See admin instructions. INJECT 6 UNITS UNDER THE SKIN AT BREAKFAST, 8 UNITS AT LUNCH AND 8 UNITS AT DINNER.   lactulose 10 GM/15ML solution Commonly known as: CHRONULAC Take 45 mLs (30 g total) by mouth 3 (three) times daily. What changed:   how much to take  when to take this   lidocaine-prilocaine cream Commonly known as: EMLA Apply 1 application topically Every Tuesday,Thursday,and Saturday with dialysis.   ondansetron 4 MG disintegrating tablet Commonly known as: Zofran ODT Take 1 tablet (4 mg total) by mouth 2 (two) times daily as needed for nausea or vomiting.   ONE TOUCH LANCETS Misc Use to test blood sugar three times daily   OneTouch Verio test strip Generic drug: glucose blood USE 1 STRIP TO CHECK GLUCOSE THREE TIMES DAILY. DX:E11.65   pantoprazole 40 MG tablet Commonly known as: PROTONIX Take 1 tablet (40 mg total) by mouth 2 (two) times daily.   rifaximin 550 MG  Tabs tablet Commonly known as: XIFAXAN Take 1 tablet (550 mg total) by mouth 2 (two) times daily.   rosuvastatin 5 MG tablet Commonly known as: CRESTOR Take 1 tablet by mouth once daily   Tresiba FlexTouch 100 UNIT/ML FlexTouch Pen Generic drug: insulin degludec Inject 0.14 mLs (14 Units total) into the skin daily. Inject 14 units under the skin once daily.        Follow-up Information    Horald Pollen, MD. Schedule an appointment as soon as possible for a visit in 1 week(s).   Specialty: Internal Medicine Contact information: [place] [address] [phone]              Allergies  Allergen Reactions  . Heparin Other (See Comments)    Patient is Muslim and is not permitted Pork derative due to religious belief)  . Pork-Derived Products Other (See Comments)    Patient does not eat pork due to religious beliefs    Consultations:  Nephrology  Palliative care evaluation is pending   Procedures/Studies: DG Chest 1 View  Result Date: 05/08/2020 CLINICAL DATA:  Altered level consciousness. EXAM: CHEST  1 VIEW COMPARISON:  Chest x-ray dated 05/02/2020 FINDINGS: There is cardiomegaly with vascular congestion. There is suggestion of hazy perihilar airspace opacities bilaterally. There is no pneumothorax. Aortic calcifications are noted. There are new metallic coils in the left upper quadrant in  addition to radiopaque material. The patient appears to have undergone interval TIPS placement. IMPRESSION: 1. Cardiomegaly with vascular congestion and developing interstitial edema. 2. Interval placement of a transjugular intrahepatic portosystemic shunt. Interval CARTO of the previously demonstrated gastric varices. 3. In the setting of new altered level of consciousness and a recently placed TIPS, consider further evaluation of the patient's ammonia level. Electronically Signed   By: Constance Holster M.D.   On: 05/08/2020 01:18   DG Chest 1 View  Result  Date: 05/02/2020 CLINICAL DATA:  Cough and altered mental status EXAM: CHEST  1 VIEW COMPARISON:  04/19/2020 FINDINGS: Cardiac shadow is enlarged. Aortic calcifications are again noted. Vascular congestion is seen although improved from the prior exam likely related to a degree of volume overload. No focal confluent infiltrate is seen. No bony abnormality is noted. IMPRESSION: Vascular congestion likely related to fluid overload. Electronically Signed   By: Inez Catalina M.D.   On: 05/02/2020 13:38   DG Abdomen 1 View  Result Date: 04/16/2020 CLINICAL DATA:  Vomiting blood.  Recent dialysis EXAM: X-RAY ABDOMEN 1 VIEW COMPARISON:  None. FINDINGS: No dilated loops of large or small bowel. Gas and stool in the rectum. No pathologic calcifications. No organomegaly. No acute osseous abnormality IMPRESSION: No acute abdominal findings. Electronically Signed   By: Suzy Bouchard M.D.   On: 04/16/2020 04:33   CT HEAD WO CONTRAST  Result Date: 05/02/2020 CLINICAL DATA:  Mental status change EXAM: CT HEAD WITHOUT CONTRAST TECHNIQUE: Contiguous axial images were obtained from the base of the skull through the vertex without intravenous contrast. COMPARISON:  None. FINDINGS: Brain: No evidence of acute large vascular territory infarction, hemorrhage, hydrocephalus, extra-axial collection or mass lesion/mass effect. Mild patchy white matter hypoattenuation, likely the sequela of chronic microvascular ischemic disease. Mild cerebral atrophy with ex vacuo ventricular dilation. Vascular: No hyperdense vessel identified. Calcific intracranial atherosclerosis. Skull: Normal. Negative for fracture or focal lesion. Sinuses/Orbits: No acute finding. Other: No mastoid effusions. IMPRESSION: No evidence of acute intracranial abnormality. Electronically Signed   By: Margaretha Sheffield MD   On: 05/02/2020 16:21   CT CHEST WO CONTRAST  Result Date: 05/09/2020 CLINICAL DATA:  Dyspnea. EXAM: CT CHEST WITHOUT CONTRAST TECHNIQUE:  Multidetector CT imaging of the chest was performed following the standard protocol without IV contrast. COMPARISON:  Dec 19, 2017 FINDINGS: Cardiovascular: The heart size is mildly enlarged. The intracardiac blood pool is hypodense relative to the adjacent myocardium consistent with anemia. There are coronary artery calcifications. Mild thoracic aortic calcifications are noted. There is no evidence for thoracic aortic aneurysm. Mediastinum/Nodes: -- No mediastinal lymphadenopathy. -- No hilar lymphadenopathy. -- No axillary lymphadenopathy. -- No supraclavicular lymphadenopathy. -- Normal thyroid gland where visualized. -  Unremarkable esophagus. Lungs/Pleura: Mild emphysematous changes are noted bilaterally. There is some mild interlobular septal thickening bilaterally, especially at the lung bases. The trachea is unremarkable. Upper Abdomen: The liver is cirrhotic. The patient is status post placement of a transjugular intrahepatic portosystemic shunt. The patient is status post prior gastric variceal embolization with STS and coils. The spleen is significantly enlarged as before. The kidneys appear to be somewhat atrophic. Musculoskeletal: No chest wall abnormality. No bony spinal canal stenosis. IMPRESSION: 1. Mild interlobular septal thickening may reflect mild interstitial edema. 2. Cardiomegaly with coronary artery disease. 3. Cirrhosis with sequela of portal hypertension and postsurgical changes as detailed above. Aortic Atherosclerosis (ICD10-I70.0) and Emphysema (ICD10-J43.9). Electronically Signed   By: Constance Holster M.D.   On: 05/09/2020 19:13  DG Chest Port 1 View  Result Date: 04/19/2020 CLINICAL DATA:  Abnormal respirations EXAM: PORTABLE CHEST 1 VIEW COMPARISON:  04/16/2020 FINDINGS: New bilateral airspace disease, right greater than left. Heart is borderline in size. Small right pleural effusion. No pneumothorax. IMPRESSION: New bilateral airspace disease, right greater than left. This  could reflect asymmetric edema or infection. Small right effusion. Electronically Signed   By: Rolm Baptise M.D.   On: 04/19/2020 08:27   DG Chest Port 1 View  Result Date: 04/16/2020 CLINICAL DATA:  Questionable sepsis EXAM: PORTABLE CHEST 1 VIEW COMPARISON:  12/20/2019 FINDINGS: Normal mediastinum and cardiac silhouette. Normal pulmonary vasculature. No evidence of effusion, infiltrate, or pneumothorax. No acute bony abnormality. IMPRESSION: No acute cardiopulmonary process. Electronically Signed   By: Suzy Bouchard M.D.   On: 04/16/2020 04:31   CT Angio Abd/Pel w/ and/or w/o  Result Date: 04/17/2020 CLINICAL DATA:  Abdominal pain and distension. EXAM: CTA ABDOMEN AND PELVIS WITHOUT AND WITH CONTRAST TECHNIQUE: Multidetector CT imaging of the abdomen and pelvis was performed using the standard protocol during bolus administration of intravenous contrast. Multiplanar reconstructed images and MIPs were obtained and reviewed to evaluate the vascular anatomy. CONTRAST:  170m OMNIPAQUE IOHEXOL 350 MG/ML SOLN COMPARISON:  May 24, 2019 FINDINGS: VASCULAR Aorta: Normal caliber aorta without aneurysm, dissection, vasculitis or significant stenosis. Celiac: Patent without evidence of aneurysm, dissection, vasculitis or significant stenosis. SMA: Patent without evidence of aneurysm, dissection, vasculitis or significant stenosis. Renals: Both renal arteries are patent without evidence of aneurysm, dissection, vasculitis, fibromuscular dysplasia or significant stenosis. IMA: Patent without evidence of aneurysm, dissection, vasculitis or significant stenosis. Inflow: Patent without evidence of aneurysm, dissection, vasculitis or significant stenosis. Proximal Outflow: Bilateral common femoral and visualized portions of the superficial and profunda femoral arteries are patent without evidence of aneurysm, dissection, vasculitis or significant stenosis. Veins: No obvious venous abnormality within the limitations  of this arterial phase study. Review of the MIP images confirms the above findings. NON-VASCULAR Lower chest: A stable 6 mm noncalcified lung nodule is seen within the anterolateral aspect of the right lower lobe. Small bilateral pleural effusions are noted. Hepatobiliary: The liver is cirrhotic in appearance. No focal liver abnormality is seen. No gallstones, gallbladder wall thickening, or biliary dilatation. Pancreas: Unremarkable. No pancreatic ductal dilatation or surrounding inflammatory changes. Spleen: The spleen is moderately enlarged with multiple dilated tortuous vessels seen along the splenic hilum. Adrenals/Urinary Tract: Adrenal glands are unremarkable. Kidneys are small in size, without renal calculi, focal lesion, or hydronephrosis. Bladder is unremarkable. Stomach/Bowel: Stomach is within normal limits. Appendix appears normal. No evidence of bowel dilatation. Mild thickening of the mid and distal sigmoid colon is seen. Lymphatic: No abnormal abdominal or pelvic lymph nodes are identified. Reproductive: The prostate gland is markedly enlarged. Other: No abdominal wall hernia or abnormality. A very small amount of abdominal free fluid is seen. Musculoskeletal: No acute or significant osseous findings. IMPRESSION: 1. Cirrhosis with findings consistent with portal hypertension. 2. Mild thickening of the mid and distal sigmoid colon, which may represent mild colitis. 3. Small bilateral pleural effusions. 4. Stable 6 mm noncalcified lung nodule within the anterolateral aspect of the right lower lobe. Correlation with 12 month follow-up chest CT is recommended to determine stability. 5. Markedly enlarged prostate gland. 6. Very small amount of abdominal free fluid. 7. No evidence of aneurysmal dilatation, dissection, vasculitis or significant stenosis within the arterial structures of the abdomen and pelvis. Electronically Signed   By: TJoyce GrossD.  On: 04/17/2020 20:38        Subjective: Patient seen and examined at bedside undergoing dialysis.  Bedside dialysis nurse interprets for the patient.  He feels okay to go home.  No overnight fever, vomiting reported.  Spoke to the son on phone and he states that patient is having diarrhea.  Discharge Exam: Vitals:   05/11/20 1014 05/11/20 1029  BP: (!) 107/46 (!) 104/40  Pulse:    Resp: 16 17  Temp:    SpO2:      General: Pt is alert, awake, not in acute distress.  Looks chronically ill.  Poor historian.  Awake, answers some questions. Cardiovascular: rate controlled, S1/S2 + Respiratory: bilateral decreased breath sounds at bases with some scattered crackles Abdominal: Soft, NT, ND, bowel sounds + Extremities: Trace lower extremity edema; no cyanosis    The results of significant diagnostics from this hospitalization (including imaging, microbiology, ancillary and laboratory) are listed below for reference.     Microbiology: Recent Results (from the past 240 hour(s))  Respiratory Panel by RT PCR (Flu A&B, Covid) - Nasopharyngeal Swab     Status: None   Collection Time: 05/02/20  4:45 PM   Specimen: Nasopharyngeal Swab  Result Value Ref Range Status   SARS Coronavirus 2 by RT PCR NEGATIVE NEGATIVE Final    Comment: (NOTE) SARS-CoV-2 target nucleic acids are NOT DETECTED.  The SARS-CoV-2 RNA is generally detectable in upper respiratoy specimens during the acute phase of infection. The lowest concentration of SARS-CoV-2 viral copies this assay can detect is 131 copies/mL. A negative result does not preclude SARS-Cov-2 infection and should not be used as the sole basis for treatment or other patient management decisions. A negative result may occur with  improper specimen collection/handling, submission of specimen other than nasopharyngeal swab, presence of viral mutation(s) within the areas targeted by this assay, and inadequate number of viral copies (<131 copies/mL). A negative result  must be combined with clinical observations, patient history, and epidemiological information. The expected result is Negative.  Fact Sheet for Patients:  PinkCheek.be  Fact Sheet for Healthcare Providers:  GravelBags.it  This test is no t yet approved or cleared by the [place] FDA and  has been authorized for detection and/or diagnosis of SARS-CoV-2 by FDA under an Emergency Use Authorization (EUA). This EUA will remain  in effect (meaning this test can be used) for the duration of the COVID-19 declaration under Section 564(b)(1) of the Act, 21 U.S.C. section 360bbb-3(b)(1), unless the authorization is terminated or revoked sooner.     Influenza A by PCR NEGATIVE NEGATIVE Final   Influenza B by PCR NEGATIVE NEGATIVE Final    Comment: (NOTE) The Xpert Xpress SARS-CoV-2/FLU/RSV assay is intended as an aid in  the diagnosis of influenza from Nasopharyngeal swab specimens and  should not be used as a sole basis for treatment. Nasal washings and  aspirates are unacceptable for Xpert Xpress SARS-CoV-2/FLU/RSV  testing.  Fact Sheet for Patients: PinkCheek.be  Fact Sheet for Healthcare Providers: GravelBags.it  This test is not yet approved or cleared by the [place] FDA and  has been authorized for detection and/or diagnosis of SARS-CoV-2 by  FDA under an Emergency Use Authorization (EUA). This EUA will remain  in effect (meaning this test can be used) for the duration of the  Covid-19 declaration under Section 564(b)(1) of the Act, 21  U.S.C. section 360bbb-3(b)(1), unless the authorization is  terminated or revoked. Performed at [facility], [place] [address],  [place], [place] [address]   Respiratory Panel by RT PCR (Flu A&B, Covid) - Nasopharyngeal Swab     Status: None   Collection Time: 05/08/20  2:38 AM   Specimen: Nasopharyngeal Swab  Result  Value Ref Range Status   SARS Coronavirus 2 by RT PCR NEGATIVE NEGATIVE Final    Comment: (NOTE) SARS-CoV-2 target nucleic acids are NOT DETECTED.  The SARS-CoV-2 RNA is generally detectable in upper respiratoy specimens during the acute phase of infection. The lowest concentration of SARS-CoV-2 viral copies this assay can detect is 131 copies/mL. A negative result does not preclude SARS-Cov-2 infection and should not be used as the sole basis for treatment or other patient management decisions. A negative result may occur with  improper specimen collection/handling, submission of specimen other than nasopharyngeal swab, presence of viral mutation(s) within the areas targeted by this assay, and inadequate number of viral copies (<131 copies/mL). A negative result must be combined with clinical observations, patient history, and epidemiological information. The expected result is Negative.  Fact Sheet for Patients:  PinkCheek.be  Fact Sheet for Healthcare Providers:  GravelBags.it  This test is no t yet approved or cleared by the [place] FDA and  has been authorized for detection and/or diagnosis of SARS-CoV-2 by FDA under an Emergency Use Authorization (EUA). This EUA will remain  in effect (meaning this test can be used) for the duration of the COVID-19 declaration under Section 564(b)(1) of the Act, 21 U.S.C. section 360bbb-3(b)(1), unless the authorization is terminated or revoked sooner.     Influenza A by PCR NEGATIVE NEGATIVE Final   Influenza B by PCR NEGATIVE NEGATIVE Final    Comment: (NOTE) The Xpert Xpress SARS-CoV-2/FLU/RSV assay is intended as an aid in  the diagnosis of influenza from Nasopharyngeal swab specimens and  should not be used as a sole basis for treatment. Nasal washings and  aspirates are unacceptable for Xpert Xpress SARS-CoV-2/FLU/RSV  testing.  Fact Sheet for  Patients: PinkCheek.be  Fact Sheet for Healthcare Providers: GravelBags.it  This test is not yet approved or cleared by the [place] FDA and  has been authorized for detection and/or diagnosis of SARS-CoV-2 by  FDA under an Emergency Use Authorization (EUA). This EUA will remain  in effect (meaning this test can be used) for the duration of the  Covid-19 declaration under Section 564(b)(1) of the Act, 21  U.S.C. section 360bbb-3(b)(1), unless the authorization is  terminated or revoked. Performed at [facility], [place] [address], [place], [place] [address]      Labs: BNP (last 3 results) No results for input(s): BNP in the last 8760 hours. Basic Metabolic Panel: Recent Labs  Lab 05/08/20 0042 05/08/20 0749 05/09/20 0401 05/10/20 0156 05/11/20 0043  NA 137 136 134* 137 138  K 3.8 4.3 3.2* 3.1* 3.0*  CL 101  --  96* 99 100  CO2 24  --  26 24 24   GLUCOSE 176*  --  189* 303* 98  BUN 23  --  11 10 17   CREATININE 5.83*  --  3.39* 3.11* 4.93*  CALCIUM 9.6  --  8.4* 7.9* 8.5*   Liver Function Tests: Recent Labs  Lab 05/08/20 0042 05/09/20 0401 05/10/20 0156 05/11/20 0043  AST 30 26 27 29   ALT 15 14 14 14   ALKPHOS 103 88 88 95  BILITOT 1.8* 1.9* 1.8* 1.2  PROT 6.8 6.0* 6.2* 6.8  ALBUMIN 2.5* 2.2* 2.8* 2.8*   No results for input(s): LIPASE, AMYLASE in  the last 168 hours. Recent Labs  Lab 05/08/20 0101 05/09/20 0401 05/10/20 0156 05/11/20 0043  AMMONIA 119* 53* 54* 69*   CBC: Recent Labs  Lab 05/08/20 0042 05/08/20 0749 05/09/20 0401 05/10/20 0156 05/11/20 0043  WBC 4.2  --  4.8 3.4* 4.5  NEUTROABS 2.6  --  3.2 1.7 2.3  HGB 10.5* 9.5* 9.2* 9.6* 10.1*  HCT 32.7* 28.0* 28.7* 30.7* 30.9*  MCV 86.1  --  82.7 83.9 84.7  PLT 67*  --  50* 44* 52*   Cardiac Enzymes: No results for input(s): CKTOTAL, CKMB, CKMBINDEX, TROPONINI in the last 168 hours. BNP: Invalid input(s):  POCBNP CBG: Recent Labs  Lab 05/08/20 2310 05/10/20 1629 05/10/20 1814 05/10/20 2138 05/11/20 0646  GLUCAP 135* 408* 426* 178* 162*   D-Dimer No results for input(s): DDIMER in the last 72 hours. Hgb A1c No results for input(s): HGBA1C in the last 72 hours. Lipid Profile No results for input(s): CHOL, HDL, LDLCALC, TRIG, CHOLHDL, LDLDIRECT in the last 72 hours. Thyroid function studies No results for input(s): TSH, T4TOTAL, T3FREE, THYROIDAB in the last 72 hours.  Invalid input(s): FREET3 Anemia work up No results for input(s): VITAMINB12, FOLATE, FERRITIN, TIBC, IRON, RETICCTPCT in the last 72 hours. Urinalysis    Component Value Date/Time   COLORURINE YELLOW 04/18/2020 0118   APPEARANCEUR HAZY (A) 04/18/2020 0118   LABSPEC 1.027 04/18/2020 0118   PHURINE 5.0 04/18/2020 0118   GLUCOSEU NEGATIVE 04/18/2020 0118   HGBUR NEGATIVE 04/18/2020 0118   BILIRUBINUR NEGATIVE 04/18/2020 0118   BILIRUBINUR neg 09/14/2015 1441   KETONESUR NEGATIVE 04/18/2020 0118   PROTEINUR 30 (A) 04/18/2020 0118   UROBILINOGEN 0.2 09/14/2015 1441   UROBILINOGEN 0.2 01/27/2013 2052   NITRITE NEGATIVE 04/18/2020 0118   LEUKOCYTESUR NEGATIVE 04/18/2020 0118   Sepsis Labs Invalid input(s): PROCALCITONIN,  WBC,  LACTICIDVEN Microbiology Recent Results (from the past 240 hour(s))  Respiratory Panel by RT PCR (Flu A&B, Covid) - Nasopharyngeal Swab     Status: None   Collection Time: 05/02/20  4:45 PM   Specimen: Nasopharyngeal Swab  Result Value Ref Range Status   SARS Coronavirus 2 by RT PCR NEGATIVE NEGATIVE Final    Comment: (NOTE) SARS-CoV-2 target nucleic acids are NOT DETECTED.  The SARS-CoV-2 RNA is generally detectable in upper respiratoy specimens during the acute phase of infection. The lowest concentration of SARS-CoV-2 viral copies this assay can detect is 131 copies/mL. A negative result does not preclude SARS-Cov-2 infection and should not be used as the sole basis for treatment  or other patient management decisions. A negative result may occur with  improper specimen collection/handling, submission of specimen other than nasopharyngeal swab, presence of viral mutation(s) within the areas targeted by this assay, and inadequate number of viral copies (<131 copies/mL). A negative result must be combined with clinical observations, patient history, and epidemiological information. The expected result is Negative.  Fact Sheet for Patients:  PinkCheek.be  Fact Sheet for Healthcare Providers:  GravelBags.it  This test is no t yet approved or cleared by the [place] FDA and  has been authorized for detection and/or diagnosis of SARS-CoV-2 by FDA under an Emergency Use Authorization (EUA). This EUA will remain  in effect (meaning this test can be used) for the duration of the COVID-19 declaration under Section 564(b)(1) of the Act, 21 U.S.C. section 360bbb-3(b)(1), unless the authorization is terminated or revoked sooner.     Influenza A by PCR NEGATIVE NEGATIVE Final   Influenza B by PCR  NEGATIVE NEGATIVE Final    Comment: (NOTE) The Xpert Xpress SARS-CoV-2/FLU/RSV assay is intended as an aid in  the diagnosis of influenza from Nasopharyngeal swab specimens and  should not be used as a sole basis for treatment. Nasal washings and  aspirates are unacceptable for Xpert Xpress SARS-CoV-2/FLU/RSV  testing.  Fact Sheet for Patients: PinkCheek.be  Fact Sheet for Healthcare Providers: GravelBags.it  This test is not yet approved or cleared by the [place] FDA and  has been authorized for detection and/or diagnosis of SARS-CoV-2 by  FDA under an Emergency Use Authorization (EUA). This EUA will remain  in effect (meaning this test can be used) for the duration of the  Covid-19 declaration under Section 564(b)(1) of the Act, 21  U.S.C.  section 360bbb-3(b)(1), unless the authorization is  terminated or revoked. Performed at [facility], [place] [address], [place], [place] [address]   Respiratory Panel by RT PCR (Flu A&B, Covid) - Nasopharyngeal Swab     Status: None   Collection Time: 05/08/20  2:38 AM   Specimen: Nasopharyngeal Swab  Result Value Ref Range Status   SARS Coronavirus 2 by RT PCR NEGATIVE NEGATIVE Final    Comment: (NOTE) SARS-CoV-2 target nucleic acids are NOT DETECTED.  The SARS-CoV-2 RNA is generally detectable in upper respiratoy specimens during the acute phase of infection. The lowest concentration of SARS-CoV-2 viral copies this assay can detect is 131 copies/mL. A negative result does not preclude SARS-Cov-2 infection and should not be used as the sole basis for treatment or other patient management decisions. A negative result may occur with  improper specimen collection/handling, submission of specimen other than nasopharyngeal swab, presence of viral mutation(s) within the areas targeted by this assay, and inadequate number of viral copies (<131 copies/mL). A negative result must be combined with clinical observations, patient history, and epidemiological information. The expected result is Negative.  Fact Sheet for Patients:  PinkCheek.be  Fact Sheet for Healthcare Providers:  GravelBags.it  This test is no t yet approved or cleared by the [place] FDA and  has been authorized for detection and/or diagnosis of SARS-CoV-2 by FDA under an Emergency Use Authorization (EUA). This EUA will remain  in effect (meaning this test can be used) for the duration of the COVID-19 declaration under Section 564(b)(1) of the Act, 21 U.S.C. section 360bbb-3(b)(1), unless the authorization is terminated or revoked sooner.     Influenza A by PCR NEGATIVE NEGATIVE Final   Influenza B by PCR NEGATIVE NEGATIVE Final    Comment:  (NOTE) The Xpert Xpress SARS-CoV-2/FLU/RSV assay is intended as an aid in  the diagnosis of influenza from Nasopharyngeal swab specimens and  should not be used as a sole basis for treatment. Nasal washings and  aspirates are unacceptable for Xpert Xpress SARS-CoV-2/FLU/RSV  testing.  Fact Sheet for Patients: PinkCheek.be  Fact Sheet for Healthcare Providers: GravelBags.it  This test is not yet approved or cleared by the [place] FDA and  has been authorized for detection and/or diagnosis of SARS-CoV-2 by  FDA under an Emergency Use Authorization (EUA). This EUA will remain  in effect (meaning this test can be used) for the duration of the  Covid-19 declaration under Section 564(b)(1) of the Act, 21  U.S.C. section 360bbb-3(b)(1), unless the authorization is  terminated or revoked. Performed at [facility], [place] [address] [place], [place] [address]      Time coordinating discharge: 35 minutes  SIGNED:   Aline August, MD  [facility] 05/11/2020, 10:54 AM

## 2020-05-11 NOTE — Progress Notes (Signed)
[facility] Progress Note   Subjective:  Seen in dialysis unit. Low BP on dialysis, but alert, not symptomatic.   Objective Vitals:   05/11/20 0827 05/11/20 0828 05/11/20 0842 05/11/20 0857  BP: (!) 93/39  (!) 87/42 (!) 77/35  Pulse:      Resp: 16 17 14 16   Temp:      TempSrc:      SpO2:      Weight:        Additional Objective Labs: Basic Metabolic Panel: Recent Labs  Lab 05/09/20 0401 05/10/20 0156 05/11/20 0043  NA 134* 137 138  K 3.2* 3.1* 3.0*  CL 96* 99 100  CO2 26 24 24   GLUCOSE 189* 303* 98  BUN 11 10 17   CREATININE 3.39* 3.11* 4.93*  CALCIUM 8.4* 7.9* 8.5*   CBC: Recent Labs  Lab 05/08/20 0042 05/08/20 0749 05/09/20 0401 05/10/20 0156 05/11/20 0043  WBC 4.2  --  4.8 3.4* 4.5  NEUTROABS 2.6  --  3.2 1.7 2.3  HGB 10.5*   < > 9.2* 9.6* 10.1*  HCT 32.7*   < > 28.7* 30.7* 30.9*  MCV 86.1  --  82.7 83.9 84.7  PLT 67*  --  50* 44* 52*   < > = values in this interval not displayed.   Blood Culture    Component Value Date/Time   SDES URINE, CLEAN CATCH 04/18/2020 0118   SPECREQUEST  04/18/2020 0118    NONE Performed at [facility] [address] [place], [place] [address]    CULT (A) 04/18/2020 0118    70,000 COLONIES/mL ESCHERICHIA COLI Confirmed Extended Spectrum Beta-Lactamase Producer (ESBL).  In bloodstream infections from ESBL organisms, carbapenems are preferred over piperacillin/tazobactam. They are shown to have a lower risk of mortality.    REPTSTATUS 04/19/2020 FINAL 04/18/2020 0118     Physical Exam General: WNWD man, nad, Heart: RRR S1,SE Lungs: Clear bilaterally, breathing unlabored on RA Abdomen: soft non-tender Extremities: No LE edema  Dialysis Access: LUE AVF +bruit   Medications:  [START ON 05/12/2020] sodium chloride     [START ON 05/12/2020] sodium chloride      amLODipine  5 mg Oral Daily   calcium acetate  667 mg Oral TID WC   carvedilol  25 mg Oral BID WC   Chlorhexidine Gluconate  Cloth  6 each Topical Q0600   darbepoetin (ARANESP) injection - DIALYSIS  60 mcg Intravenous Q Tue-HD   insulin aspart  0-9 Units Subcutaneous TID WC   insulin glargine  10 Units Subcutaneous Daily   lactulose  30 g Oral TID   multivitamin with minerals  1 tablet Oral Daily   pantoprazole  40 mg Oral BID   rifaximin  550 mg Oral Daily   rosuvastatin  5 mg Oral Daily    Dialysis Orders:  AF TTS 4h  67.5kg  2/3.5Ca  L  AVF  Hep none    - mircera 60 q2 , to start 10/5  - last HD 10/2, left under at 65.2kg  Assessment/Plan: 69 y.o. year-old w/ hx of ESRD (Goodpasture's), cirrhosis, HTN, DM2  S/p TIPS procedure. Admit with AMS and concern for pulm edema.   1. Bilat airspace opacities/concern for Pulm edema on CXR--. not symptomatic, not fitting clinical picture of volume overload.  CT chest unremarkable.  Is now below OP dry weight. Lower at discharge.  2. ESRD - on HD TTS.  Has not missed HD recently. Had HD 10/4, 10/5. HD on schedule today.  3. AMS - hx recent TIPS, prob d/t cirrhosis.  B/Cr not sig ^^. Improving.  4. Cirrhosis - sp TIPS last week at [facility] 5. HTN - cont meds 6. IDDM - pre primary team 7. Anemia ckd - Hb 10.1 Aranesp 60 given 10/5.  Dispo: Stable for discharge from renal standpoint.   Lynnda Child PA-C [facility] 05/11/2020,9:27 AM

## 2020-05-11 NOTE — TOC Initial Note (Addendum)
Transition of Care [facility]) - Initial/Assessment Note    Patient Details  Name: William [name] MRN: [id number] Date of Birth: March 04, 1951  [facility] of [facility] CM/SW Contact:    Zenon Mayo, RN Phone Number: 05/11/2020, 4:14 PM  Clinical Narrative:                 Patient is for dc today, his son was here to transport him home, he wanted a walker , NCM gave him one that was donated to the hospital. He states he does not want any HHPT, HHRN or [place].  NCM asked if he wanted outpatient palliative services , he states no. He has no other needs.  Expected Discharge Plan: Home/Self Care Barriers to Discharge: No Barriers Identified   Patient Goals and CMS Choice Patient states their goals for this hospitalization and ongoing recovery are:: get better   Choice offered to / list presented to : NA  Expected Discharge Plan and Services Expected Discharge Plan: Home/Self Care In-house Referral: NA Discharge Planning Services: CM Consult Post Acute Care Choice: NA Living arrangements for the past 2 months: Single Family Home Expected Discharge Date: 05/11/20                 DME Agency: NA       HH Arranged: Refused HH          Prior Living Arrangements/Services Living arrangements for the past 2 months: Single Family Home Lives with:: Adult Children Patient language and need for interpreter reviewed:: Yes Do you feel safe going back to the place where you live?: Yes      Need for Family Participation in Patient Care: Yes (Comment) Care giver support system in place?: Yes (comment)   Criminal Activity/Legal Involvement Pertinent to Current Situation/Hospitalization: No - Comment as needed  Activities of Daily Living      Permission Sought/Granted                  Emotional Assessment Appearance:: Appears stated age Attitude/Demeanor/Rapport: Engaged Affect (typically observed): Appropriate Orientation: : Oriented to Self, Oriented to Place, Oriented to   Time, Oriented to Situation Alcohol / Substance Use: Not Applicable Psych Involvement: No (comment)  Admission diagnosis:  Hepatic encephalopathy ([place]) [K72.90] Patient Active Problem List   Diagnosis Date Noted  . Goals of care, counseling/discussion   . Palliative care by specialist   . Hepatic encephalopathy ([place]) 05/02/2020  . GIB (gastrointestinal bleeding) 04/16/2020  . Hyperkalemia, diminished renal excretion 06/01/2019  . AV fistula thrombosis, initial encounter ([place]) 06/01/2019  . AV fistula occlusion, initial encounter ([place])   . Chronic systolic CHF (congestive heart failure) ([place])   . Encounter for other preprocedural examination 09/23/2018  . Esophageal varices ([place]) 09/23/2018  . Thrombocytopenia ([place]) 09/23/2018  . CHF (congestive heart failure) ([place]) 09/22/2018  . ESRD on dialysis ([place]) 02/06/2018  . CKD (chronic kidney disease) stage 4, GFR 15-29 ml/min (HCC) 12/19/2017  . Cirrhosis of liver with ascites ([place]) 12/09/2017  . 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 Acute on chronic diastolic CHF (congestive heart failure) ([place]) 11/06/2017  . Pancytopenia ([place]) 11/06/2017  . GERD (gastroesophageal reflux disease) 11/06/2017  . Essential hypertension 11/06/2017  . Type II diabetes mellitus with renal manifestations ([place]) 08/25/2015   PCP:  Horald Pollen, MD Pharmacy:   [facility], [place]. [place]. [place] [address] Phone: [phone] Fax: [phone]     Social Determinants of  Health (SDOH) Interventions    Readmission Risk Interventions Readmission Risk Prevention Plan 05/11/2020  Transportation Screening Complete  HRI or Home Care Consult Complete  Social Work Consult for [place] Planning/Counseling Complete  Palliative Care Screening Not Applicable  Medication Review Press photographer) Complete  Some recent data might be hidden

## 2020-05-13 ENCOUNTER — Telehealth: Payer: Self-pay | Admitting: Nephrology

## 2020-05-13 NOTE — Telephone Encounter (Signed)
Transition of Care Contact from [place]   Date of Discharge: 05/11/20 Date of Contact: 05/13/20 Method of contact: phone Talked to patient son - Maris Berger   Patient contacted to discuss transition of care form recent hospitaliztion. Patient was admitted to [facility] from 05/08/20 to 05/11/20 with the discharge diagnosis of hepatic encephalopathy.    Medication changes were reviewed - amlodipine, carvedilol, zyrtec and bactroban d/c.   Patient will follow up with is outpatient dialysis center today and 05/16/20.   Other follow up needs include none identified.   Jen Mow, PA-C [facility] Pager: [phone]

## 2020-05-18 DIAGNOSIS — K7291 Hepatic failure, unspecified with coma: Secondary | ICD-10-CM | POA: Diagnosis not present

## 2020-05-18 DIAGNOSIS — N186 End stage renal disease: Secondary | ICD-10-CM | POA: Diagnosis not present

## 2020-05-18 DIAGNOSIS — I120 Hypertensive chronic kidney disease with stage 5 chronic kidney disease or end stage renal disease: Secondary | ICD-10-CM | POA: Diagnosis not present

## 2020-05-18 DIAGNOSIS — Z992 Dependence on renal dialysis: Secondary | ICD-10-CM | POA: Diagnosis not present

## 2020-05-24 DIAGNOSIS — Z01818 Encounter for other preprocedural examination: Secondary | ICD-10-CM | POA: Diagnosis not present

## 2020-05-24 DIAGNOSIS — R188 Other ascites: Secondary | ICD-10-CM | POA: Diagnosis not present

## 2020-05-24 DIAGNOSIS — I8500 Esophageal varices without bleeding: Secondary | ICD-10-CM | POA: Diagnosis not present

## 2020-05-24 DIAGNOSIS — K7460 Unspecified cirrhosis of liver: Secondary | ICD-10-CM | POA: Diagnosis not present

## 2020-05-24 DIAGNOSIS — Z95828 Presence of other vascular implants and grafts: Secondary | ICD-10-CM | POA: Diagnosis not present

## 2020-05-25 DIAGNOSIS — E1129 Type 2 diabetes mellitus with other diabetic kidney complication: Secondary | ICD-10-CM | POA: Diagnosis not present

## 2020-06-05 DIAGNOSIS — Z992 Dependence on renal dialysis: Secondary | ICD-10-CM | POA: Diagnosis not present

## 2020-06-05 DIAGNOSIS — E1122 Type 2 diabetes mellitus with diabetic chronic kidney disease: Secondary | ICD-10-CM | POA: Diagnosis not present

## 2020-06-05 DIAGNOSIS — N186 End stage renal disease: Secondary | ICD-10-CM | POA: Diagnosis not present

## 2020-06-06 DIAGNOSIS — N186 End stage renal disease: Secondary | ICD-10-CM | POA: Diagnosis not present

## 2020-06-06 DIAGNOSIS — N2581 Secondary hyperparathyroidism of renal origin: Secondary | ICD-10-CM | POA: Diagnosis not present

## 2020-06-06 DIAGNOSIS — D509 Iron deficiency anemia, unspecified: Secondary | ICD-10-CM | POA: Diagnosis not present

## 2020-06-06 DIAGNOSIS — Z992 Dependence on renal dialysis: Secondary | ICD-10-CM | POA: Diagnosis not present

## 2020-06-06 DIAGNOSIS — D631 Anemia in chronic kidney disease: Secondary | ICD-10-CM | POA: Diagnosis not present

## 2020-06-21 DIAGNOSIS — Z7901 Long term (current) use of anticoagulants: Secondary | ICD-10-CM | POA: Diagnosis not present

## 2020-07-04 ENCOUNTER — Encounter (HOSPITAL_COMMUNITY): Payer: Self-pay | Admitting: Pediatrics

## 2020-07-04 ENCOUNTER — Inpatient Hospital Stay (HOSPITAL_COMMUNITY)
Admission: EM | Admit: 2020-07-04 | Discharge: 2020-07-07 | DRG: 070 | Disposition: A | Discharge status: Home Health | Payer: Medicare Other | Attending: Family Medicine | Admitting: Family Medicine

## 2020-07-04 ENCOUNTER — Emergency Department (HOSPITAL_COMMUNITY): Payer: Medicare Other

## 2020-07-04 DIAGNOSIS — I6782 Cerebral ischemia: Secondary | ICD-10-CM | POA: Diagnosis not present

## 2020-07-04 DIAGNOSIS — Z833 Family history of diabetes mellitus: Secondary | ICD-10-CM | POA: Diagnosis not present

## 2020-07-04 DIAGNOSIS — Z992 Dependence on renal dialysis: Secondary | ICD-10-CM

## 2020-07-04 DIAGNOSIS — Z20822 Contact with and (suspected) exposure to covid-19: Secondary | ICD-10-CM | POA: Diagnosis not present

## 2020-07-04 DIAGNOSIS — I959 Hypotension, unspecified: Secondary | ICD-10-CM | POA: Diagnosis not present

## 2020-07-04 DIAGNOSIS — I5030 Unspecified diastolic (congestive) heart failure: Secondary | ICD-10-CM | POA: Diagnosis not present

## 2020-07-04 DIAGNOSIS — I10 Essential (primary) hypertension: Secondary | ICD-10-CM | POA: Diagnosis not present

## 2020-07-04 DIAGNOSIS — E1122 Type 2 diabetes mellitus with diabetic chronic kidney disease: Secondary | ICD-10-CM | POA: Diagnosis present

## 2020-07-04 DIAGNOSIS — R531 Weakness: Secondary | ICD-10-CM | POA: Diagnosis not present

## 2020-07-04 DIAGNOSIS — Z823 Family history of stroke: Secondary | ICD-10-CM

## 2020-07-04 DIAGNOSIS — H5440 Blindness, one eye, unspecified eye: Secondary | ICD-10-CM | POA: Diagnosis present

## 2020-07-04 DIAGNOSIS — I5032 Chronic diastolic (congestive) heart failure: Secondary | ICD-10-CM | POA: Diagnosis present

## 2020-07-04 DIAGNOSIS — Z888 Allergy status to other drugs, medicaments and biological substances status: Secondary | ICD-10-CM | POA: Diagnosis not present

## 2020-07-04 DIAGNOSIS — Z7682 Awaiting organ transplant status: Secondary | ICD-10-CM | POA: Diagnosis not present

## 2020-07-04 DIAGNOSIS — I132 Hypertensive heart and chronic kidney disease with heart failure and with stage 5 chronic kidney disease, or end stage renal disease: Secondary | ICD-10-CM | POA: Diagnosis not present

## 2020-07-04 DIAGNOSIS — K7290 Hepatic failure, unspecified without coma: Secondary | ICD-10-CM

## 2020-07-04 DIAGNOSIS — G319 Degenerative disease of nervous system, unspecified: Secondary | ICD-10-CM | POA: Diagnosis not present

## 2020-07-04 DIAGNOSIS — Z79899 Other long term (current) drug therapy: Secondary | ICD-10-CM | POA: Diagnosis not present

## 2020-07-04 DIAGNOSIS — N250 Renal osteodystrophy: Secondary | ICD-10-CM | POA: Diagnosis not present

## 2020-07-04 DIAGNOSIS — J811 Chronic pulmonary edema: Secondary | ICD-10-CM | POA: Diagnosis not present

## 2020-07-04 DIAGNOSIS — M310 Hypersensitivity angiitis: Secondary | ICD-10-CM | POA: Diagnosis present

## 2020-07-04 DIAGNOSIS — I517 Cardiomegaly: Secondary | ICD-10-CM | POA: Diagnosis not present

## 2020-07-04 DIAGNOSIS — Z794 Long term (current) use of insulin: Secondary | ICD-10-CM

## 2020-07-04 DIAGNOSIS — K7210 Chronic hepatic failure without coma: Secondary | ICD-10-CM | POA: Diagnosis not present

## 2020-07-04 DIAGNOSIS — Z91018 Allergy to other foods: Secondary | ICD-10-CM | POA: Diagnosis not present

## 2020-07-04 DIAGNOSIS — N186 End stage renal disease: Secondary | ICD-10-CM | POA: Diagnosis not present

## 2020-07-04 DIAGNOSIS — Z9841 Cataract extraction status, right eye: Secondary | ICD-10-CM

## 2020-07-04 DIAGNOSIS — Z961 Presence of intraocular lens: Secondary | ICD-10-CM | POA: Diagnosis present

## 2020-07-04 DIAGNOSIS — K7460 Unspecified cirrhosis of liver: Secondary | ICD-10-CM | POA: Diagnosis not present

## 2020-07-04 DIAGNOSIS — E7220 Disorder of urea cycle metabolism, unspecified: Secondary | ICD-10-CM

## 2020-07-04 DIAGNOSIS — D631 Anemia in chronic kidney disease: Secondary | ICD-10-CM | POA: Diagnosis not present

## 2020-07-04 DIAGNOSIS — D61818 Other pancytopenia: Secondary | ICD-10-CM | POA: Diagnosis present

## 2020-07-04 DIAGNOSIS — R410 Disorientation, unspecified: Secondary | ICD-10-CM | POA: Diagnosis not present

## 2020-07-04 DIAGNOSIS — R4182 Altered mental status, unspecified: Secondary | ICD-10-CM

## 2020-07-04 DIAGNOSIS — E1129 Type 2 diabetes mellitus with other diabetic kidney complication: Secondary | ICD-10-CM | POA: Diagnosis not present

## 2020-07-04 DIAGNOSIS — E785 Hyperlipidemia, unspecified: Secondary | ICD-10-CM | POA: Diagnosis present

## 2020-07-04 DIAGNOSIS — K7682 Hepatic encephalopathy: Secondary | ICD-10-CM

## 2020-07-04 DIAGNOSIS — G9341 Metabolic encephalopathy: Principal | ICD-10-CM | POA: Diagnosis present

## 2020-07-04 DIAGNOSIS — N2581 Secondary hyperparathyroidism of renal origin: Secondary | ICD-10-CM | POA: Diagnosis not present

## 2020-07-04 DIAGNOSIS — E8889 Other specified metabolic disorders: Secondary | ICD-10-CM | POA: Diagnosis present

## 2020-07-04 DIAGNOSIS — R404 Transient alteration of awareness: Secondary | ICD-10-CM | POA: Diagnosis not present

## 2020-07-04 DIAGNOSIS — J90 Pleural effusion, not elsewhere classified: Secondary | ICD-10-CM | POA: Diagnosis not present

## 2020-07-04 DIAGNOSIS — H748X1 Other specified disorders of right middle ear and mastoid: Secondary | ICD-10-CM | POA: Diagnosis not present

## 2020-07-04 DIAGNOSIS — R58 Hemorrhage, not elsewhere classified: Secondary | ICD-10-CM | POA: Diagnosis not present

## 2020-07-04 DIAGNOSIS — D696 Thrombocytopenia, unspecified: Secondary | ICD-10-CM | POA: Diagnosis present

## 2020-07-04 DIAGNOSIS — Z9842 Cataract extraction status, left eye: Secondary | ICD-10-CM

## 2020-07-04 DIAGNOSIS — Z8249 Family history of ischemic heart disease and other diseases of the circulatory system: Secondary | ICD-10-CM

## 2020-07-04 DIAGNOSIS — R000 Tachycardia, unspecified: Secondary | ICD-10-CM | POA: Diagnosis not present

## 2020-07-04 DIAGNOSIS — I120 Hypertensive chronic kidney disease with stage 5 chronic kidney disease or end stage renal disease: Secondary | ICD-10-CM | POA: Diagnosis not present

## 2020-07-04 LAB — CBC WITH DIFFERENTIAL/PLATELET
Abs Immature Granulocytes: 0 10*3/uL (ref 0.00–0.07)
Basophils Absolute: 0 10*3/uL (ref 0.0–0.1)
Basophils Relative: 1 %
Eosinophils Absolute: 0.1 10*3/uL (ref 0.0–0.5)
Eosinophils Relative: 3 %
HCT: 33.3 % — ABNORMAL LOW (ref 39.0–52.0)
Hemoglobin: 10.5 g/dL — ABNORMAL LOW (ref 13.0–17.0)
Immature Granulocytes: 0 %
Lymphocytes Relative: 34 %
Lymphs Abs: 1.3 10*3/uL (ref 0.7–4.0)
MCH: 26.9 pg (ref 26.0–34.0)
MCHC: 31.5 g/dL (ref 30.0–36.0)
MCV: 85.2 fL (ref 80.0–100.0)
Monocytes Absolute: 0.6 10*3/uL (ref 0.1–1.0)
Monocytes Relative: 17 %
Neutro Abs: 1.8 10*3/uL (ref 1.7–7.7)
Neutrophils Relative %: 45 %
Platelets: 54 10*3/uL — ABNORMAL LOW (ref 150–400)
RBC: 3.91 MIL/uL — ABNORMAL LOW (ref 4.22–5.81)
RDW: 15.2 % (ref 11.5–15.5)
WBC: 3.8 10*3/uL — ABNORMAL LOW (ref 4.0–10.5)
nRBC: 0 % (ref 0.0–0.2)

## 2020-07-04 LAB — COMPREHENSIVE METABOLIC PANEL
ALT: 22 U/L (ref 0–44)
AST: 57 U/L — ABNORMAL HIGH (ref 15–41)
Albumin: 2.9 g/dL — ABNORMAL LOW (ref 3.5–5.0)
Alkaline Phosphatase: 116 U/L (ref 38–126)
Anion gap: 14 (ref 5–15)
BUN: 15 mg/dL (ref 8–23)
CO2: 27 mmol/L (ref 22–32)
Calcium: 8.4 mg/dL — ABNORMAL LOW (ref 8.9–10.3)
Chloride: 95 mmol/L — ABNORMAL LOW (ref 98–111)
Creatinine, Ser: 3.65 mg/dL — ABNORMAL HIGH (ref 0.61–1.24)
GFR, Estimated: 17 mL/min — ABNORMAL LOW (ref >60)
Glucose, Bld: 131 mg/dL — ABNORMAL HIGH (ref 70–99)
Potassium: 4.5 mmol/L (ref 3.5–5.1)
Sodium: 136 mmol/L (ref 135–145)
Total Bilirubin: 1.9 mg/dL — ABNORMAL HIGH (ref 0.3–1.2)
Total Protein: 6.7 g/dL (ref 6.5–8.1)

## 2020-07-04 LAB — MAGNESIUM: Magnesium: 2.3 mg/dL (ref 1.7–2.4)

## 2020-07-04 LAB — PROTIME-INR
INR: 1.1 (ref 0.8–1.2)
Prothrombin Time: 14.1 seconds (ref 11.4–15.2)

## 2020-07-04 LAB — AMMONIA: Ammonia: 97 umol/L — ABNORMAL HIGH (ref 9–35)

## 2020-07-04 LAB — HEMOGLOBIN A1C
Hgb A1c MFr Bld: 6.9 % — ABNORMAL HIGH (ref 4.8–5.6)
Mean Plasma Glucose: 151.33 mg/dL

## 2020-07-04 LAB — RESP PANEL BY RT-PCR (FLU A&B, COVID) ARPGX2
Influenza A by PCR: NEGATIVE
Influenza B by PCR: NEGATIVE
SARS Coronavirus 2 by RT PCR: NEGATIVE

## 2020-07-04 LAB — CBG MONITORING, ED: Glucose-Capillary: 218 mg/dL — ABNORMAL HIGH (ref 70–99)

## 2020-07-04 LAB — LACTIC ACID, PLASMA
Lactic Acid, Venous: 2.8 mmol/L (ref 0.5–1.9)
Lactic Acid, Venous: 3 mmol/L (ref 0.5–1.9)

## 2020-07-04 MED ORDER — HYDRALAZINE HCL 25 MG PO TABS: 25.0000 mg | ORAL_TABLET | Freq: Three times a day (TID) | ORAL | Status: DC

## 2020-07-04 MED ORDER — CARVEDILOL 12.5 MG PO TABS
12.5000 mg | ORAL_TABLET | Freq: Two times a day (BID) | ORAL | Status: DC
  Administered 2020-07-05 – 2020-07-06 (×3): 12.5 mg via ORAL
  Filled 2020-07-04 (×3): qty 1

## 2020-07-04 MED ORDER — ADULT MULTIVITAMIN W/MINERALS CH
1.0000 | ORAL_TABLET | Freq: Every day | ORAL | Status: DC
  Administered 2020-07-05 – 2020-07-07 (×3): 1 via ORAL
  Filled 2020-07-04 (×5): qty 1

## 2020-07-04 MED ORDER — CALCIUM ACETATE (PHOS BINDER) 667 MG PO CAPS
667.0000 mg | ORAL_CAPSULE | Freq: Three times a day (TID) | ORAL | Status: DC
  Administered 2020-07-05 – 2020-07-06 (×5): 667 mg via ORAL
  Filled 2020-07-04 (×6): qty 1

## 2020-07-04 MED ORDER — INSULIN ASPART 100 UNIT/ML ~~LOC~~ SOLN
0.0000 [IU] | Freq: Every day | SUBCUTANEOUS | Status: DC
  Administered 2020-07-04 – 2020-07-06 (×2): 2 [IU] via SUBCUTANEOUS

## 2020-07-04 MED ORDER — RIFAXIMIN 550 MG PO TABS
550.0000 mg | ORAL_TABLET | Freq: Two times a day (BID) | ORAL | Status: DC
  Administered 2020-07-05 – 2020-07-07 (×5): 550 mg via ORAL
  Filled 2020-07-04 (×7): qty 1

## 2020-07-04 MED ORDER — PANTOPRAZOLE SODIUM 40 MG PO TBEC
40.0000 mg | DELAYED_RELEASE_TABLET | Freq: Two times a day (BID) | ORAL | Status: DC
  Administered 2020-07-04 – 2020-07-07 (×6): 40 mg via ORAL
  Filled 2020-07-04 (×6): qty 1

## 2020-07-04 MED ORDER — ROSUVASTATIN CALCIUM 5 MG PO TABS
5.0000 mg | ORAL_TABLET | Freq: Every day | ORAL | Status: DC
  Administered 2020-07-05 – 2020-07-07 (×3): 5 mg via ORAL
  Filled 2020-07-04 (×4): qty 1

## 2020-07-04 MED ORDER — LACTULOSE 10 GM/15ML PO SOLN
30.0000 g | Freq: Three times a day (TID) | ORAL | Status: DC
  Administered 2020-07-04 – 2020-07-07 (×7): 30 g via ORAL
  Filled 2020-07-04 (×12): qty 45

## 2020-07-04 MED ORDER — LACTULOSE 10 GM/15ML PO SOLN
30.0000 g | Freq: Once | ORAL | Status: AC
  Administered 2020-07-04: 30 g via ORAL
  Filled 2020-07-04: qty 45

## 2020-07-04 MED ORDER — INSULIN ASPART 100 UNIT/ML ~~LOC~~ SOLN
0.0000 [IU] | Freq: Three times a day (TID) | SUBCUTANEOUS | Status: DC
  Administered 2020-07-05: 2 [IU] via SUBCUTANEOUS
  Administered 2020-07-06: 4 [IU] via SUBCUTANEOUS
  Administered 2020-07-07: 2 [IU] via SUBCUTANEOUS

## 2020-07-04 MED ORDER — CARVEDILOL 3.125 MG PO TABS: 3.1250 mg | ORAL_TABLET | Freq: Two times a day (BID) | ORAL | Status: DC

## 2020-07-04 MED ORDER — INSULIN GLARGINE 100 UNIT/ML ~~LOC~~ SOLN
14.0000 [IU] | Freq: Every day | SUBCUTANEOUS | Status: DC
  Administered 2020-07-05 – 2020-07-06 (×2): 14 [IU] via SUBCUTANEOUS
  Filled 2020-07-04 (×5): qty 0.14

## 2020-07-04 NOTE — ED Provider Notes (Signed)
[place] EMERGENCY DEPARTMENT Provider Note   CSN: [id number] Arrival date & time: 07/04/20  1221     History Chief Complaint  Patient presents with  . Altered Mental Status    William [name] is a 69 y.o. male.  HPI     69 year old male with history of cirrhosis/end-stage liver disease possibly secondary to nonalcoholic fatty liver disease complicated by portal hypertension with ascites, thrombocytopenia, varices, under evaluation for combined liver-renal transplant at [facility], portal vein thrombosis, history of TIPS and transvenous obliteration coil embolization of gastric varices and gastric renal shunt in September, prior admissions for hepatic encephalopathy, ESRD on dialysis, Goodpasture's disease, type 2 diabetes, hypertension anemia due to chronic kidney disease, CHF, who presents with concern for altered mental status.   Attempted arabic translator however pt with AMS Son at bedside providing history  He takes lactulose 45 mL liters 3 times daily and and rifaximin but rifaximin ran out on Sunday  Had nausea this AM, however was able to walk into dialysis. While he was at dialysis developed worsening altered mental status. Had had similar symptoms with prior hepatic encephalopathy, does seem worse today however. Has some shaking/jerking movements. No history of head trauma, focal weakness, no diarrhea/black or bloody stools. Had not complained of chest pain or dyspnea while he was more coherent. Did not describe abdominal pain. At this time is confused, sleepy.   Past Medical History:  Diagnosis Date  . Anemia   . Blind one eye   . CHF (congestive heart failure) ([place])   . Cirrhosis ([place])   . ESRD on hemodialysis ([place])   . Goodpasture's disease [facility])    on outpatient plasmapheresis/notes 11/20/2017  . Grade I diastolic dysfunction 43/27/6147  . History of anemia due to chronic kidney disease   . History of blood transfusion X 1   "UGIB; low blood  count"  . History of plasmapheresis    "qod" (11/20/2017)  . Hypertension   . Pancytopenia ([place]) 08/20/2017  . Type II diabetes mellitus [facility])     Patient Active Problem List   Diagnosis Date Noted  . Metabolic encephalopathy 05/03/5746  . Goals of care, counseling/discussion   . Palliative care by specialist   . Hepatic encephalopathy ([place]) 05/02/2020  . GIB (gastrointestinal bleeding) 04/16/2020  . Hyperkalemia, diminished renal excretion 06/01/2019  . AV fistula thrombosis, initial encounter ([place]) 06/01/2019  . AV fistula occlusion, initial encounter ([place])   . Chronic systolic CHF (congestive heart failure) ([place])   . Encounter for other preprocedural examination 09/23/2018  . Esophageal varices ([place]) 09/23/2018  . Thrombocytopenia ([place]) 09/23/2018  . CHF (congestive heart failure) ([place]) 09/22/2018  . ESRD on dialysis ([place]) 02/06/2018  . CKD (chronic kidney disease) stage 4, GFR 15-29 ml/min (HCC) 12/19/2017  . Cirrhosis of liver with ascites ([place]) 12/09/2017  . Goodpasture's syndrome ([place]) with associated hemoptysis 11/20/2017  . Acute on chronic diastolic CHF (congestive heart failure) ([place]) 11/06/2017  . Pancytopenia ([place]) 11/06/2017  . GERD (gastroesophageal reflux disease) 11/06/2017  . Essential hypertension 11/06/2017  . Type II diabetes mellitus with renal manifestations ([place]) 08/25/2015    Past Surgical History:  Procedure Laterality Date  . AV FISTULA PLACEMENT Left 12/26/2017   Procedure: LEFT BRACHIOCEPHALIC ARTERIOVENOUS (AV) FISTULA CREATION;  Surgeon: Conrad Freeland, MD;  Location: [place];  Service: Vascular;  Laterality: Left;  . BASCILIC VEIN TRANSPOSITION Left 02/16/2018   Procedure: BRACHIOBASILIC VEIN TRANSPOSITION SECOND STAGE;  Surgeon: Conrad Ellston, MD;  Location: [place];  Service: Vascular;  Laterality: Left;  . CATARACT EXTRACTION W/ INTRAOCULAR LENS  IMPLANT, BILATERAL Bilateral   . ESOPHAGEAL BANDING N/A 04/01/2018   Procedure: ESOPHAGEAL BANDING;   Surgeon: Otis Brace, MD;  Location: [facility];  Service: Gastroenterology;  Laterality: N/A;  . ESOPHAGEAL BANDING N/A 06/29/2018   Procedure: ESOPHAGEAL BANDING;  Surgeon: Otis Brace, MD;  Location: [facility];  Service: Gastroenterology;  Laterality: N/A;  . ESOPHAGOGASTRODUODENOSCOPY (EGD) WITH PROPOFOL N/A 04/01/2018   Procedure: ESOPHAGOGASTRODUODENOSCOPY (EGD) WITH PROPOFOL;  Surgeon: Otis Brace, MD;  [facility]: [facility];  Service: Gastroenterology;  Laterality: N/A;  . ESOPHAGOGASTRODUODENOSCOPY (EGD) WITH PROPOFOL N/A 06/29/2018   Procedure: ESOPHAGOGASTRODUODENOSCOPY (EGD) WITH PROPOFOL;  Surgeon: Otis Brace, MD;  [facility]: [facility];  Service: Gastroenterology;  Laterality: N/A;  . ESOPHAGOGASTRODUODENOSCOPY (EGD) WITH PROPOFOL N/A 09/30/2018   Procedure: ESOPHAGOGASTRODUODENOSCOPY (EGD) WITH PROPOFOL;  Surgeon: Otis Brace, MD;  [facility]: [facility];  Service: Gastroenterology;  Laterality: N/A;  . ESOPHAGOGASTRODUODENOSCOPY (EGD) WITH PROPOFOL N/A 04/20/2019   Procedure: ESOPHAGOGASTRODUODENOSCOPY (EGD) WITH PROPOFOL;  Surgeon: Otis Brace, MD;  [facility]: [facility];  Service: Gastroenterology;  Laterality: N/A;  . ESOPHAGOGASTRODUODENOSCOPY (EGD) WITH PROPOFOL N/A 04/17/2020   Procedure: ESOPHAGOGASTRODUODENOSCOPY (EGD) WITH PROPOFOL;  Surgeon: Wonda Horner, MD;  Location: [place];  Service: Gastroenterology;  Laterality: N/A;  . I & D EXTREMITY Left 02/18/2018   Procedure: IRRIGATION AND DEBRIDEMENT LEFT ARM;  Surgeon: Serafina Mitchell, MD;  Location: [facility];  Service: Vascular;  Laterality: Left;  . INSERTION OF DIALYSIS CATHETER N/A 01/19/2018   Procedure: INSERTION OF DIALYSIS CATHETER - RIGHT INTERNAL JUGULAR PLACEMENT;  Surgeon: Rosetta Posner, MD;  Location: [place];  Service: Vascular;  Laterality: N/A;  . IR FLUORO GUIDE CV LINE RIGHT  11/10/2017  . IR PARACENTESIS  12/10/2017  . IR PARACENTESIS  12/26/2017  . IR REMOVAL TUN CV  CATH W/O FL  11/28/2017  . IR THROMBECTOMY AV FISTULA W/THROMBOLYSIS/PTA INC/SHUNT/IMG LEFT Left 06/02/2019  . IR US GUIDE VASC ACCESS LEFT  06/02/2019  . IR US GUIDE VASC ACCESS RIGHT  11/10/2017  . NECK SURGERY  2007   "back of neck; no hardware in there"       Family History  Problem Relation Age of Onset  . Diabetes Mellitus II Sister   . Diabetes Sister   . Stroke Brother   . Heart attack Brother     Social History   Tobacco Use  . Smoking status: Never Smoker  . Smokeless tobacco: Never Used  Vaping Use  . Vaping Use: Never used  Substance Use Topics  . Alcohol use: Never  . Drug use: Never    Home Medications Prior to Admission medications   Medication Sig Start Date End Date Taking? Authorizing Provider  calcium acetate (PHOSLO) 667 MG capsule Take 1 capsule (667 mg total) by mouth 3 (three) times daily with meals. 05/11/20  Yes Alekh, Kshitiz, MD  glucose blood (ONETOUCH VERIO) test strip USE 1 STRIP TO CHECK GLUCOSE THREE TIMES DAILY. DX:E11.65 01/31/20  Yes Elayne Snare, MD  insulin degludec (TRESIBA FLEXTOUCH) 100 UNIT/ML SOPN FlexTouch Pen Inject 0.14 mLs (14 Units total) into the skin daily. Inject 14 units under the skin once daily. 03/02/19  Yes Elayne Snare, MD  Insulin Pen Needle (ADVOCATE INSULIN PEN NEEDLES) 31G X 5 MM MISC Use four times daily to inject insulin 04/27/18  Yes Elayne Snare, MD  insulin regular (NOVOLIN R,HUMULIN R) 100 units/mL injection Inject 6-8 Units into the skin See admin instructions. INJECT 6  UNITS UNDER THE SKIN AT BREAKFAST, 8 UNITS AT LUNCH AND 8 UNITS AT DINNER.   Yes [provider]  lactulose (CHRONULAC) 10 GM/15ML solution Take 30 g by mouth 2 (two) times daily.  05/12/20  Yes [provider]  lidocaine-prilocaine (EMLA) cream Apply 1 application topically Every Tuesday,Thursday,and Saturday with dialysis. 08/24/18  Yes [provider]  Methoxy PEG-Epoetin Beta (MIRCERA IJ) 1 Dose See admin instructions.  Dialysis gives this medication 05/09/20 05/08/21 Yes [provider]  Multiple Vitamins-Minerals (ONE-A-DAY MENS 50+ PO) Take 1 tablet by mouth daily.   Yes [provider]  ondansetron (ZOFRAN ODT) 4 MG disintegrating tablet Take 1 tablet (4 mg total) by mouth 2 (two) times daily as needed for nausea or vomiting. 05/01/20  Yes [name], [place] C, PA-C  ONE TOUCH LANCETS MISC Use to test blood sugar three times daily 04/27/18  Yes Elayne Snare, MD  pantoprazole (PROTONIX) 40 MG tablet Take 1 tablet (40 mg total) by mouth 2 (two) times daily. 05/04/20  Yes Domenic Polite, MD  rifaximin (XIFAXAN) 550 MG TABS tablet Take 550 mg by mouth 2 (two) times daily.  05/12/20  Yes [provider]  rosuvastatin (CRESTOR) 5 MG tablet Take 1 tablet by mouth once daily Patient taking differently: Take 5 mg by mouth daily.  04/25/20  Yes Elayne Snare, MD  lactulose (CHRONULAC) 10 GM/15ML solution Take 45 mLs (30 g total) by mouth 3 (three) times daily. Patient not taking: Reported on 07/04/2020 05/11/20   Aline August, MD    Allergies    Heparin and Pork-derived products  Review of Systems   Review of Systems  Constitutional: Negative for fever.  Respiratory: Negative for cough and shortness of breath.   Cardiovascular: Negative for chest pain.  Gastrointestinal: Positive for blood in stool, nausea and vomiting. Negative for abdominal pain, anal bleeding and diarrhea.  Psychiatric/Behavioral: Positive for confusion.    Physical Exam Updated Vital Signs BP (!) 142/66   Pulse 87   Temp 98.8 F (37.1 C) (Oral)   Resp 16   SpO2 99%   Physical Exam Vitals and nursing note reviewed.  Constitutional:      General: He is not in acute distress.    Appearance: He is well-developed. He is not diaphoretic.  HENT:     Head: Normocephalic and atraumatic.  Eyes:     Conjunctiva/sclera: Conjunctivae normal.  Cardiovascular:     Rate and Rhythm: Normal rate and regular rhythm.     Heart  sounds: Normal heart sounds. No murmur heard.  No friction rub. No gallop.   Pulmonary:     Effort: Pulmonary effort is normal. No respiratory distress.     Breath sounds: Normal breath sounds. No wheezing or rales.  Abdominal:     General: There is no distension.     Palpations: Abdomen is soft.     Tenderness: There is no abdominal tenderness. There is no guarding.  Musculoskeletal:     Cervical back: Normal range of motion.  Skin:    General: Skin is warm and dry.  Neurological:     Mental Status: He is alert.     Comments: Sleepy, intermittently following commands, moving all 4 extremities, no focal weakness, no facial droop, normal sensation. Asterixis present when holding arms up. Appears to have normal EOM but not able to participate.  Continues to roll on side to sleep while attempting exam     ED Results / Procedures / Treatments   Labs (all labs  ordered are listed, but only abnormal results are displayed) Labs Reviewed  COMPREHENSIVE METABOLIC PANEL - Abnormal; Notable for the following components:      Result Value   Chloride 95 (*)    Glucose, Bld 131 (*)    Creatinine, Ser 3.65 (*)    Calcium 8.4 (*)    Albumin 2.9 (*)    AST 57 (*)    Total Bilirubin 1.9 (*)    GFR, Estimated 17 (*)    All other components within normal limits  AMMONIA - Abnormal; Notable for the following components:   Ammonia 97 (*)    All other components within normal limits  LACTIC ACID, PLASMA - Abnormal; Notable for the following components:   Lactic Acid, Venous 2.8 (*)    All other components within normal limits  LACTIC ACID, PLASMA - Abnormal; Notable for the following components:   Lactic Acid, Venous 3.0 (*)    All other components within normal limits  CBC WITH DIFFERENTIAL/PLATELET - Abnormal; Notable for the following components:   WBC 3.8 (*)    RBC 3.91 (*)    Hemoglobin 10.5 (*)    HCT 33.3 (*)    Platelets 54 (*)    All other components within normal limits    HEMOGLOBIN A1C - Abnormal; Notable for the following components:   Hgb A1c MFr Bld 6.9 (*)    All other components within normal limits  CBG MONITORING, ED - Abnormal; Notable for the following components:   Glucose-Capillary 218 (*)    All other components within normal limits  RESP PANEL BY RT-PCR (FLU A&B, COVID) ARPGX2  MAGNESIUM  PROTIME-INR  CBC WITH DIFFERENTIAL/PLATELET  URINALYSIS, ROUTINE W REFLEX MICROSCOPIC  HIV ANTIBODY (ROUTINE TESTING W REFLEX)  BASIC METABOLIC PANEL  CBC  VITAMIN B12  TSH    EKG EKG Interpretation  Date/Time:  Tuesday July 04 2020 12:34:39 EST Ventricular Rate:  90 PR Interval:    QRS Duration: 78 QT Interval:  393 QTC Calculation: 481 R Axis:   62 Text Interpretation: Sinus rhythm, artifact Borderline T wave abnormalities Borderline prolonged QT interval No significant change since last tracing Confirmed by Gareth Morgan [id number]) on 07/04/2020 10:01:23 PM   Radiology CT Head Wo Contrast  Result Date: 07/04/2020 CLINICAL DATA:  Delirium. EXAM: CT HEAD WITHOUT CONTRAST TECHNIQUE: Contiguous axial images were obtained from the base of the skull through the vertex without intravenous contrast. COMPARISON:  Head CT 05/02/2020 FINDINGS: Brain: Mild generalized cerebral atrophy. Mild ill-defined hypoattenuation within the cerebral white matter is nonspecific, but compatible with chronic small vessel ischemic disease. There is no acute intracranial hemorrhage. No demarcated cortical infarct. No extra-axial fluid collection. No evidence of intracranial mass. No midline shift. Vascular: No hyperdense vessel.  Atherosclerotic calcifications. Skull: Normal. Negative for fracture or focal lesion. Sinuses/Orbits: Visualized orbits show no acute finding. Mild mucosal thickening within the imaged right maxillary sinus. Other: Right mastoid effusion. IMPRESSION: No evidence of acute intracranial abnormality. Mild cerebral atrophy and chronic small vessel  ischemic disease, stable as compared to the head CT of 05/02/2020. Mild right maxillary sinus mucosal thickening. Right mastoid effusion. Electronically Signed   By: Kellie Simmering DO   On: 07/04/2020 16:05   DG Chest Portable 1 View  Result Date: 07/04/2020 CLINICAL DATA:  Altered mental status. EXAM: PORTABLE CHEST 1 VIEW COMPARISON:  May 08, 2020. FINDINGS: Stable cardiomegaly with mild central pulmonary vascular congestion. No pneumothorax or pleural effusion is noted. No consolidative process is noted. Bony thorax  is unremarkable. IMPRESSION: Stable cardiomegaly with mild central pulmonary vascular congestion. Electronically Signed   By: Marijo Conception M.D.   On: 07/04/2020 14:34    Procedures Procedures (including critical care time)  Medications Ordered in ED Medications  rifaximin (XIFAXAN) tablet 550 mg (has no administration in time range)  rosuvastatin (CRESTOR) tablet 5 mg (5 mg Oral Refused 07/04/20 1856)  insulin glargine (LANTUS) injection 14 Units (has no administration in time range)  calcium acetate (PHOSLO) capsule 667 mg (has no administration in time range)  lactulose (CHRONULAC) 10 GM/15ML solution 30 g (30 g Oral Given 07/04/20 1855)  pantoprazole (PROTONIX) EC tablet 40 mg (has no administration in time range)  multivitamin with minerals tablet 1 tablet (1 tablet Oral Refused 07/04/20 1856)  insulin aspart (novoLOG) injection 0-6 Units (has no administration in time range)  insulin aspart (novoLOG) injection 0-5 Units (has no administration in time range)  carvedilol (COREG) tablet 12.5 mg (has no administration in time range)  lactulose (CHRONULAC) 10 GM/15ML solution 30 g (30 g Oral Given 07/04/20 1610)    ED Course  I have reviewed the triage vital signs and the nursing notes.  Pertinent labs & imaging results that were available during my care of the patient were reviewed by me and considered in my medical decision making (see chart for details).    MDM  Rules/Calculators/A&P                          69 year old male with history of cirrhosis/end-stage liver disease possibly secondary to nonalcoholic fatty liver disease complicated by portal hypertension with ascites, thrombocytopenia, varices, under evaluation for combined liver[facility]renal [facility] at [facility], portal vein thrombosis, history of TIPS and transvenous obliteration coil embolization of gastric varices and gastric renal shunt in September, prior admissions for hepatic encephalopathy, ESRD on dialysis, Goodpasture's disease, type 2 diabetes, hypertension anemia due to chronic kidney disease, CHF, who presents with concern for altered mental status.  Do not see focal abnormaliteis on exam, presentation more consistent with general confusion than aphasia and doubt CVA.  No history of medication changes with exception of stopping the rifaximin.  No abdominal tenderness, no fever, doubt SBP or sepsis as etiology of symptoms.   Ammonia returned in 90s, suspect hepatic encephalopathy in setting of recently stopping rifaximin. Given lactulose. Plan for admission however CT pending at this time.   Final Clinical Impression(s) / ED Diagnoses Final diagnoses:  Altered mental status, unspecified altered mental status type  Hepatic encephalopathy ([place])  Hyperammonemia ([place])  ESRD (end stage renal disease) ([place])    Rx / DC Orders ED Discharge Orders    None       Gareth Morgan, MD 07/04/20 2235

## 2020-07-04 NOTE — ED Notes (Signed)
Dinner Tray Ordered @ 1723.

## 2020-07-04 NOTE — ED Notes (Signed)
RN messaged pharmacy to send meds

## 2020-07-04 NOTE — H&P (Addendum)
History and Physical   William [name] KPV:[id number] DOB: 07/22/1951 DOA: 07/04/2020  PCP: Horald Pollen, MD  Outpatient Specialists: [facility], Dr. Marcille Buffy Patient coming from: Hemodialysis center  I have personally briefly reviewed patient's old medical records in [place].  Chief Concern: Mental status change  HPI: William [name] is a 69 y.o. male with medical history significant for end-stage renal disease on hemodialysis, end-stage liver disease, liver cirrhosis, on transplant list, history of hypertension, presented to the emergency department from HD because of altered mental status.   Per son at bedside, patient's mental status change started today, minimally responsive today.  He reports patient took last dose of rifaximin on Sunday 07/02/20.   ROS was negative for fever, chills per son  Endorses nausea and vomting x 2 (once last night and once in the am on day of admission).   Social history: lives with son, denies tobacco use. Denies etoh use and recreational drugs. Formerly worked as Psychologist, sport and exercise. Immigrated to [place] in 2011.   ED Course:   Review of Systems: Unable to complete due to acute encephalopathy  Assessment/Plan  Active Problems:   Metabolic encephalopathy   Acute mental status change with hypotension secondary to metabolic encephalopathy-suspect secondary to lack of understanding of medication requirement -Ammonia elevated at 48 -Educated son at bedside the importance of lactulose and rifaximin daily chronic use -Rifaximin 550 mg p.o. twice daily resumed -Lactulose 30 g p.o. 3 times daily resumed -Checking B12, TSH  History of acute GI bleed-CBC is reassuring and H&H is at baseline -CBC in the a.m.  Hyperlipidemia-Crestor 5 mg p.o. daily resumed  Insulin-dependent diabetes mellitus resumed home glargine 14 units subcutaneous daily -Insulin SSI -Diet: Carb/heart  End-stage renal disease on hemodialysis-completed session at  hemodialysis today, reported 2.7 L removed on today session  Hypertension in the ED and on admission -Previously on Coreg 12.5 twice daily and this was discontinued at previous admission due to low normal pressure -Resumed Coreg 12.5 mg twice daily, instructions to hold if SBP less than 100  Chart reviewed.   DVT prophylaxis: TED hose due to chronic thrombocytopenia Code Status: full code Diet: Heart healthy and carb modified Family Communication: son at bedside, [facility] Disposition Plan: Clinical course Consults called: None at this time Admission status: Inpatient  Past Medical History:  Diagnosis Date  . Anemia   . Blind one eye   . CHF (congestive heart failure) ([place])   . Cirrhosis ([place])   . ESRD on hemodialysis ([place])   . Goodpasture's disease [facility])    on outpatient plasmapheresis/notes 11/20/2017  . Grade I diastolic dysfunction 67/54/4920  . History of anemia due to chronic kidney disease   . History of blood transfusion X 1   "UGIB; low blood count"  . History of plasmapheresis    "qod" (11/20/2017)  . Hypertension   . Pancytopenia ([place]) 08/20/2017  . Type II diabetes mellitus ([place])    Past Surgical History:  Procedure Laterality Date  . AV FISTULA PLACEMENT Left 12/26/2017   Procedure: LEFT BRACHIOCEPHALIC ARTERIOVENOUS (AV) FISTULA CREATION;  Surgeon: Conrad Oak Hill, MD;  Location: [facility];  Service: Vascular;  Laterality: Left;  . BASCILIC VEIN TRANSPOSITION Left 02/16/2018   Procedure: BRACHIOBASILIC VEIN TRANSPOSITION SECOND STAGE;  Surgeon: Conrad Berthoud, MD;  Location: [place];  Service: Vascular;  Laterality: Left;  . CATARACT EXTRACTION W/ INTRAOCULAR LENS  IMPLANT, BILATERAL Bilateral   . ESOPHAGEAL BANDING N/A 04/01/2018   Procedure: ESOPHAGEAL BANDING;  Surgeon: Alessandra Bevels,  Orson Gear, MD;  Location: [facility];  Service: Gastroenterology;  Laterality: N/A;  . ESOPHAGEAL BANDING N/A 06/29/2018   Procedure: ESOPHAGEAL BANDING;  Surgeon: Otis Brace, MD;   Location: [facility];  Service: Gastroenterology;  Laterality: N/A;  . ESOPHAGOGASTRODUODENOSCOPY (EGD) WITH PROPOFOL N/A 04/01/2018   Procedure: ESOPHAGOGASTRODUODENOSCOPY (EGD) WITH PROPOFOL;  Surgeon: Otis Brace, MD;  [facility];  Service: Gastroenterology;  Laterality: N/A;  . ESOPHAGOGASTRODUODENOSCOPY (EGD) WITH PROPOFOL N/A 06/29/2018   Procedure: ESOPHAGOGASTRODUODENOSCOPY (EGD) WITH PROPOFOL;  Surgeon: Otis Brace, MD;  [facility];  Service: Gastroenterology;  Laterality: N/A;  . ESOPHAGOGASTRODUODENOSCOPY (EGD) WITH PROPOFOL N/A 09/30/2018   Procedure: ESOPHAGOGASTRODUODENOSCOPY (EGD) WITH PROPOFOL;  Surgeon: Otis Brace, MD;  [facility];  Service: Gastroenterology;  Laterality: N/A;  . ESOPHAGOGASTRODUODENOSCOPY (EGD) WITH PROPOFOL N/A 04/20/2019   Procedure: ESOPHAGOGASTRODUODENOSCOPY (EGD) WITH PROPOFOL;  Surgeon: Otis Brace, MD;  [facility];  Service: Gastroenterology;  Laterality: N/A;  . ESOPHAGOGASTRODUODENOSCOPY (EGD) WITH PROPOFOL N/A 04/17/2020   Procedure: ESOPHAGOGASTRODUODENOSCOPY (EGD) WITH PROPOFOL;  Surgeon: Wonda Horner, MD;  Location: [place] [place];  Service: Gastroenterology;  Laterality: N/A;  . I & D EXTREMITY Left 02/18/2018   Procedure: IRRIGATION AND DEBRIDEMENT LEFT ARM;  Surgeon: Serafina Mitchell, MD;  Location: [facility];  Service: Vascular;  Laterality: Left;  . INSERTION OF DIALYSIS CATHETER N/A 01/19/2018   Procedure: INSERTION OF DIALYSIS CATHETER - RIGHT INTERNAL JUGULAR PLACEMENT;  Surgeon: Rosetta Posner, MD;  Location: [place];  Service: Vascular;  Laterality: N/A;  . IR FLUORO GUIDE CV LINE RIGHT  11/10/2017  . IR PARACENTESIS  12/10/2017  . IR PARACENTESIS  12/26/2017  . IR REMOVAL TUN CV CATH W/O FL  11/28/2017  . IR THROMBECTOMY AV FISTULA W/THROMBOLYSIS/PTA INC/SHUNT/IMG LEFT Left 06/02/2019  . IR US GUIDE VASC ACCESS LEFT  06/02/2019  . IR US GUIDE VASC ACCESS RIGHT  11/10/2017  . NECK  SURGERY  2007   "back of neck; no hardware in there"   Social History:  reports that he has never smoked. He has never used smokeless tobacco. He reports that he does not drink alcohol and does not use drugs.  Allergies  Allergen Reactions  . Heparin Other (See Comments)    Patient is Muslim and is not permitted Pork derative due to religious belief)  . Pork-Derived Products Other (See Comments)    Patient does not eat pork due to religious beliefs   Family History  Problem Relation Age of Onset  . Diabetes Mellitus II Sister   . Diabetes Sister   . Stroke Brother   . Heart attack Brother    Family history: Family history reviewed and not pertinent  Prior to Admission medications   Medication Sig Start Date End Date Taking? Authorizing Provider  calcium acetate (PHOSLO) 667 MG capsule Take 1 capsule (667 mg total) by mouth 3 (three) times daily with meals. 05/11/20  Yes Alekh, Kshitiz, MD  glucose blood (ONETOUCH VERIO) test strip USE 1 STRIP TO CHECK GLUCOSE THREE TIMES DAILY. DX:E11.65 01/31/20  Yes Elayne Snare, MD  insulin degludec (TRESIBA FLEXTOUCH) 100 UNIT/ML SOPN FlexTouch Pen Inject 0.14 mLs (14 Units total) into the skin daily. Inject 14 units under the skin once daily. 03/02/19  Yes Elayne Snare, MD  Insulin Pen Needle (ADVOCATE INSULIN PEN NEEDLES) 31G X 5 MM MISC Use four times daily to inject insulin 04/27/18  Yes Elayne Snare, MD  insulin regular (NOVOLIN R,HUMULIN R) 100 units/mL injection Inject 6-8 Units into the skin See admin  instructions. INJECT 6 UNITS UNDER THE SKIN AT BREAKFAST, 8 UNITS AT LUNCH AND 8 UNITS AT DINNER.   Yes [provider]  lactulose (CHRONULAC) 10 GM/15ML solution Take 30 g by mouth 2 (two) times daily.  05/12/20  Yes [provider]  lidocaine-prilocaine (EMLA) cream Apply 1 application topically Every Tuesday,Thursday,and Saturday with dialysis. 08/24/18  Yes [provider]  Methoxy PEG-Epoetin Beta (MIRCERA IJ) 1 Dose See  admin instructions. Dialysis gives this medication 05/09/20 05/08/21 Yes [provider]  Multiple Vitamins-Minerals (ONE-A-DAY MENS 50+ PO) Take 1 tablet by mouth daily.   Yes [provider]  ondansetron (ZOFRAN ODT) 4 MG disintegrating tablet Take 1 tablet (4 mg total) by mouth 2 (two) times daily as needed for nausea or vomiting. 05/01/20  Yes [name], Ostrander C, PA-C  ONE TOUCH LANCETS MISC Use to test blood sugar three times daily 04/27/18  Yes Elayne Snare, MD  pantoprazole (PROTONIX) 40 MG tablet Take 1 tablet (40 mg total) by mouth 2 (two) times daily. 05/04/20  Yes Domenic Polite, MD  rifaximin (XIFAXAN) 550 MG TABS tablet Take 550 mg by mouth 2 (two) times daily.  05/12/20  Yes [provider]  rosuvastatin (CRESTOR) 5 MG tablet Take 1 tablet by mouth once daily Patient taking differently: Take 5 mg by mouth daily.  04/25/20  Yes Elayne Snare, MD  lactulose (CHRONULAC) 10 GM/15ML solution Take 45 mLs (30 g total) by mouth 3 (three) times daily. Patient not taking: Reported on 07/04/2020 05/11/20   Aline August, MD   Physical Exam: Vitals:   07/04/20 1430 07/04/20 1515 07/04/20 1545 07/04/20 1600  BP: (!) 165/64 (!) 150/56  (!) 168/64  Pulse: 86 84 82 93  Resp: (!) 22 16 13    Temp:      TempSrc:      SpO2: 100% 100% 100% 98%   Constitutional: appears older than chronological age, NAD, calm, comfortable Eyes: PERRL, lids and conjunctivae normal ENMT: Mucous membranes are moist. Posterior pharynx clear of any exudate or lesions. Age-appropriate dentition. Hearing appropriate Neck: normal, supple, no masses, no thyromegaly Respiratory: clear to auscultation bilaterally, no wheezing, no crackles. Normal respiratory effort. No accessory muscle use.  Cardiovascular: Regular rate and rhythm, no murmurs / rubs / gallops. No extremity edema. 2+ pedal pulses. No carotid bruits.  Abdomen: no tenderness, no masses palpated, no hepatosplenomegaly. Bowel sounds positive.    Musculoskeletal: no clubbing / cyanosis. No joint deformity upper and lower extremities. Good ROM, no contractures, no atrophy. Normal muscle tone.  Skin: no rashes, lesions, ulcers. No induration Neurologic: Sensation intact. Strength 5/5 in all 4.  Psychiatric: Normal judgment and insight. Alert and oriented x 3. Normal mood.   EKG: Sinus rhythm with rate of 90, QTc 481  Chest x-ray on Admission: Personally reviewed and I agree with radiologist reading as below.  CT Head Wo Contrast  Result Date: 07/04/2020 CLINICAL DATA:  Delirium. EXAM: CT HEAD WITHOUT CONTRAST TECHNIQUE: Contiguous axial images were obtained from the base of the skull through the vertex without intravenous contrast. COMPARISON:  Head CT 05/02/2020 FINDINGS: Brain: Mild generalized cerebral atrophy. Mild ill-defined hypoattenuation within the cerebral white matter is nonspecific, but compatible with chronic small vessel ischemic disease. There is no acute intracranial hemorrhage. No demarcated cortical infarct. No extra-axial fluid collection. No evidence of intracranial mass. No midline shift. Vascular: No hyperdense vessel.  Atherosclerotic calcifications. Skull: Normal. Negative for fracture or focal lesion. Sinuses/Orbits: Visualized orbits show no acute finding. Mild mucosal thickening  within the imaged right maxillary sinus. Other: Right mastoid effusion. IMPRESSION: No evidence of acute intracranial abnormality. Mild cerebral atrophy and chronic small vessel ischemic disease, stable as compared to the head CT of 05/02/2020. Mild right maxillary sinus mucosal thickening. Right mastoid effusion. Electronically Signed   By: Kellie Simmering DO   On: 07/04/2020 16:05   DG Chest Portable 1 View  Result Date: 07/04/2020 CLINICAL DATA:  Altered mental status. EXAM: PORTABLE CHEST 1 VIEW COMPARISON:  May 08, 2020. FINDINGS: Stable cardiomegaly with mild central pulmonary vascular congestion. No pneumothorax or pleural effusion  is noted. No consolidative process is noted. Bony thorax is unremarkable. IMPRESSION: Stable cardiomegaly with mild central pulmonary vascular congestion. Electronically Signed   By: Marijo Conception M.D.   On: 07/04/2020 14:34   Labs on Admission: I have personally reviewed following labs  CBC: No results for input(s): WBC, NEUTROABS, HGB, HCT, MCV, PLT in the last 168 hours. Basic Metabolic Panel: Recent Labs  Lab 07/04/20 1315  NA 136  K 4.5  CL 95*  CO2 27  GLUCOSE 131*  BUN 15  CREATININE 3.65*  CALCIUM 8.4*  MG 2.3   GFR: CrCl cannot be calculated (Unknown ideal weight.).  Liver Function Tests: Recent Labs  Lab 07/04/20 1315  AST 57*  ALT 22  ALKPHOS 116  BILITOT 1.9*  PROT 6.7  ALBUMIN 2.9*   No results for input(s): LIPASE, AMYLASE in the last 168 hours. Recent Labs  Lab 07/04/20 1315  AMMONIA 97*   Urine analysis:    Component Value Date/Time   COLORURINE YELLOW 04/18/2020 0118   APPEARANCEUR HAZY (A) 04/18/2020 0118   LABSPEC 1.027 04/18/2020 0118   PHURINE 5.0 04/18/2020 0118   GLUCOSEU NEGATIVE 04/18/2020 0118   HGBUR NEGATIVE 04/18/2020 0118   BILIRUBINUR NEGATIVE 04/18/2020 0118   BILIRUBINUR neg 09/14/2015 1441   KETONESUR NEGATIVE 04/18/2020 0118   PROTEINUR 30 (A) 04/18/2020 0118   UROBILINOGEN 0.2 09/14/2015 1441   UROBILINOGEN 0.2 01/27/2013 2052   NITRITE NEGATIVE 04/18/2020 0118   LEUKOCYTESUR NEGATIVE 04/18/2020 0118   Banks Chaikin N [place] Filice D.O. [facility]  If 12AM-7AM, please contact overnight-coverage provider If 7AM-7PM, please contact day coverage provider [email or web address]  07/04/2020, 5:02 PM

## 2020-07-04 NOTE — ED Triage Notes (Signed)
Patient arrived via EMS; from HD. Reported 2.7 L removed on today's session. Staff reported altered mental status and hypotension. EMS endorsed recent issue w/ GI bleed about a month ago. Patient is not responding verbally w/ interpreter.

## 2020-07-04 NOTE — ED Provider Notes (Signed)
Medical Decision Making: Care of patient assumed from Dr. Billy Fischer at [phone].  Agree with history, physical exam and plan.  See their note for further details.  Briefly, The pt p/w Altered mental status, elevated ammonia, given lactulose, mild lactic acidosis, CKD on labs, hx of ESRD, CT head pending.   Current plan is as follows: admit for AMS and treatment, CT pending  CT imaging reviewed by radiology myself shows no acute intracranial abnormality.  I spoke to the hospitalist team to admit this patient for further management of the acute panic encephalopathy given the patient's chronic disease state and they agreed to treat the patient and admit.  I personally reviewed and interpreted all labs/imaging.      Breck Coons, MD 07/04/20 [phone]

## 2020-07-05 DIAGNOSIS — H5440 Blindness, one eye, unspecified eye: Secondary | ICD-10-CM | POA: Diagnosis not present

## 2020-07-05 DIAGNOSIS — Z91018 Allergy to other foods: Secondary | ICD-10-CM | POA: Diagnosis not present

## 2020-07-05 DIAGNOSIS — N186 End stage renal disease: Secondary | ICD-10-CM | POA: Diagnosis not present

## 2020-07-05 DIAGNOSIS — Z8249 Family history of ischemic heart disease and other diseases of the circulatory system: Secondary | ICD-10-CM | POA: Diagnosis not present

## 2020-07-05 DIAGNOSIS — Z833 Family history of diabetes mellitus: Secondary | ICD-10-CM | POA: Diagnosis not present

## 2020-07-05 DIAGNOSIS — D631 Anemia in chronic kidney disease: Secondary | ICD-10-CM | POA: Diagnosis not present

## 2020-07-05 DIAGNOSIS — Z888 Allergy status to other drugs, medicaments and biological substances status: Secondary | ICD-10-CM | POA: Diagnosis not present

## 2020-07-05 DIAGNOSIS — Z992 Dependence on renal dialysis: Secondary | ICD-10-CM | POA: Diagnosis not present

## 2020-07-05 DIAGNOSIS — E1122 Type 2 diabetes mellitus with diabetic chronic kidney disease: Secondary | ICD-10-CM | POA: Diagnosis not present

## 2020-07-05 LAB — BASIC METABOLIC PANEL
Anion gap: 16 — ABNORMAL HIGH (ref 5–15)
BUN: 24 mg/dL — ABNORMAL HIGH (ref 8–23)
CO2: 26 mmol/L (ref 22–32)
Calcium: 9 mg/dL (ref 8.9–10.3)
Chloride: 99 mmol/L (ref 98–111)
Creatinine, Ser: 5.36 mg/dL — ABNORMAL HIGH (ref 0.61–1.24)
GFR, Estimated: 11 mL/min — ABNORMAL LOW (ref >60)
Glucose, Bld: 127 mg/dL — ABNORMAL HIGH (ref 70–99)
Potassium: 3.8 mmol/L (ref 3.5–5.1)
Sodium: 141 mmol/L (ref 135–145)

## 2020-07-05 LAB — CBC
HCT: 33.4 % — ABNORMAL LOW (ref 39.0–52.0)
Hemoglobin: 10.5 g/dL — ABNORMAL LOW (ref 13.0–17.0)
MCH: 26.8 pg (ref 26.0–34.0)
MCHC: 31.4 g/dL (ref 30.0–36.0)
MCV: 85.2 fL (ref 80.0–100.0)
Platelets: 57 10*3/uL — ABNORMAL LOW (ref 150–400)
RBC: 3.92 MIL/uL — ABNORMAL LOW (ref 4.22–5.81)
RDW: 15.4 % (ref 11.5–15.5)
WBC: 3.6 10*3/uL — ABNORMAL LOW (ref 4.0–10.5)
nRBC: 0 % (ref 0.0–0.2)

## 2020-07-05 LAB — GLUCOSE, CAPILLARY
Glucose-Capillary: 228 mg/dL — ABNORMAL HIGH (ref 70–99)
Glucose-Capillary: 174 mg/dL — ABNORMAL HIGH (ref 70–99)
Glucose-Capillary: 151 mg/dL — ABNORMAL HIGH (ref 70–99)

## 2020-07-05 LAB — AMMONIA: Ammonia: 49 umol/L — ABNORMAL HIGH (ref 9–35)

## 2020-07-05 LAB — HIV ANTIBODY (ROUTINE TESTING W REFLEX): HIV Screen 4th Generation wRfx: NONREACTIVE

## 2020-07-05 LAB — CBG MONITORING, ED
Glucose-Capillary: 193 mg/dL — ABNORMAL HIGH (ref 70–99)
Glucose-Capillary: 77 mg/dL (ref 70–99)
Glucose-Capillary: 135 mg/dL — ABNORMAL HIGH (ref 70–99)

## 2020-07-05 LAB — C DIFFICILE QUICK SCREEN W PCR REFLEX
C Diff antigen: NEGATIVE
C Diff interpretation: NOT DETECTED
C Diff toxin: NEGATIVE

## 2020-07-05 LAB — VITAMIN B12: Vitamin B-12: 2158 pg/mL — ABNORMAL HIGH (ref 180–914)

## 2020-07-05 LAB — TSH: TSH: 0.509 u[IU]/mL (ref 0.350–4.500)

## 2020-07-05 MED ORDER — SODIUM CHLORIDE 0.9 % IV BOLUS
250.0000 mL | Freq: Once | INTRAVENOUS | Status: AC
  Administered 2020-07-05: 250 mL via INTRAVENOUS

## 2020-07-05 MED ORDER — ACETAMINOPHEN 325 MG PO TABS
650.0000 mg | ORAL_TABLET | Freq: Four times a day (QID) | ORAL | Status: DC | PRN
  Administered 2020-07-05: 650 mg via ORAL
  Filled 2020-07-05: qty 2

## 2020-07-05 MED ORDER — HYDRALAZINE HCL 20 MG/ML IJ SOLN
5.0000 mg | Freq: Once | INTRAMUSCULAR | Status: AC
  Administered 2020-07-05: 5 mg via INTRAVENOUS
  Filled 2020-07-05: qty 1

## 2020-07-05 MED ORDER — ONDANSETRON HCL 4 MG PO TABS
4.0000 mg | ORAL_TABLET | Freq: Once | ORAL | Status: AC
  Administered 2020-07-05: 4 mg via ORAL
  Filled 2020-07-05: qty 1

## 2020-07-05 NOTE — ED Notes (Signed)
Phlebotomy aware of ammonia  Lab drawl.

## 2020-07-05 NOTE — Progress Notes (Signed)
Was notified earlier that son wish to speak to the physician about patient's plan of care.  Reviewing his H&P from Dr. Tobie Poet is clearly stated that she did discuss the plan of care with the son when she admitted the patient. Went to the room and both patient and his son were sound asleep.  Son did not wake up to voice so I let him sleep and he discussed care plan with the daytime attending

## 2020-07-05 NOTE — ED Notes (Signed)
Pt sleeping.  Visitor at bedside states everything going well.  No request at present

## 2020-07-05 NOTE — Progress Notes (Addendum)
Patient BP was 93/51 when checked at 1528, patient was asymptomatic and MD was notified, patient BP was rechecked manually and was 107/48 at 1540. Patient blood pressure was rechecked at [place] and was 87/45, MD was notified and no new orders have been placed the previous occurrence and no new orders have been placed for this occurrence. RN will continue to monitor this patient.   Update: New orders were verbally given to administer 299m/hr Normal saline bolus.

## 2020-07-05 NOTE — Progress Notes (Addendum)
NEW ADMISSION NOTE  Arrival Method: bed Mental Orientation: Alert and oriented x4 Telemetry: yes Assessment: Completed Skin: see notes Iv: right hand Pain: 0 Tubes: 0 Safety Measures: Safety Fall Prevention Plan has been given, discussed and signed Admission: Completed [facility] Orientation: Patient has been orientated to the room, unit and staff.  Family: 1, son  Orders have been reviewed and implemented. Will continue to monitor the patient. Call light has been placed within reach and bed alarm has been activated.  Patient is a poor historian and unable to answer admitting questions  Patient and patient son unable to answer all admitting documentation.  Beatris Ship, RN

## 2020-07-05 NOTE — Progress Notes (Signed)
PROGRESS NOTE    William [name]  OBS:[id number] DOB: 11-30-1950 DOA: 07/04/2020 PCP: Horald Pollen, MD   Brief Narrative:  This 69 years old male with medical history significant for end-stage renal disease on hemodialysis, end-stage liver disease, liver cirrhosis on transplant list,  history of hypertension presented in the emergency department from hemodialysis unit because of altered mental status. Patient only speaks Arabic,  history is obtained from patient's son.  Patient mental status is improving.   Patient is admitted for hepatic encephalopathy could be secondary to inadequate medications( ran out of Rifaximin). Patient has fallen while going to the bathroom. Denies any head injury or visible injuries. PT and OT ordered.   Assessment & Plan:   Active Problems:   Metabolic encephalopathy  Acute mental status change with hypotension secondary to metabolic encephalopathy -suspect secondary to lack of understanding of medication requirement. -Ammonia elevated at 97-trended down to 6 -Educated son at bedside the importance of lactulose and rifaximin daily chronic use. -Rifaximin 550 mg p.o. twice daily resumed -Lactulose 30 g p.o. 3 times daily resumed -Patient had two bowel movements.  History of acute GI bleed -CBC is reassuring and H&H is at baseline. -CBC in the a.m.  Hyperlipidemia -Crestor 5 mg p.o. daily   Insulin-dependent diabetes mellitus  Continue home glargine 14 units subcutaneous daily -Insulin SSI -Diet: Carb/heart  End-stage renal disease on hemodialysis Continue hemodialysis as scheduled.  Hypertension in the ED and on admission -Previously on Coreg 12.5 twice daily and this was discontinued at previous admission due to low normal pressure -Resumed Coreg 12.5 mg twice daily, instructions to hold if SBP less than 100   DVT prophylaxis: SCDS Code Status: Full code. Family Communication: Spoke with son at bedside Disposition  Plan:  Status is: Inpatient  Remains inpatient appropriate because:Inpatient level of care appropriate due to severity of illness   Dispo: The patient is from: Home              Anticipated d/c is to: Home with home services.              Anticipated d/c date is: 2 days              Patient currently is not medically stable to d/c.   Consultants:    None  Procedures: Antimicrobials:  Anti-infectives (From admission, onward)   Start     Dose/Rate Route Frequency Ordered Stop   07/04/20 1730  rifaximin (XIFAXAN) tablet 550 mg        550 mg Oral 2 times daily 07/04/20 1725        Subjective: Patient was seen and examined at bedside. Overnight events noted. Patient appears alert and oriented x 2.  Son reports patient's mental status is improving.  he is generally weak.  Objective: Vitals:   07/05/20 1246 07/05/20 1300 07/05/20 1528 07/05/20 1540  BP:  (!) 127/107 (!) 93/51 (!) 107/48  Pulse:  75 75   Resp:  17 17   Temp: 98.1 F (36.7 C) 97.9 F (36.6 C) 97.9 F (36.6 C)   TempSrc: Oral Oral Oral   SpO2:  95% 97%   Weight:  59.7 kg    Height:  5' 5"  (1.651 m)     No intake or output data in the 24 hours ending 07/05/20 1718 Filed Weights   07/05/20 1300  Weight: 59.7 kg    Examination:  General exam: Appears calm and comfortable , not in any distress Respiratory system: Clear to auscultation.  Respiratory effort normal. Cardiovascular system: S1 & S2 heard, RRR. No JVD, murmurs, rubs, gallops or clicks. No pedal edema. Gastrointestinal system: Abdomen is nondistended, soft and nontender. No organomegaly or masses felt. Normal bowel sounds heard. Central nervous system: Alert and oriented. No focal neurological deficits. Extremities: No edema, no cyanosis, no clubbing. Skin: No rashes, lesions or ulcers Psychiatry: Judgement and insight appear normal. Mood & affect appropriate.     Data Reviewed: I have personally reviewed following labs and imaging  studies  CBC: Recent Labs  Lab 07/04/20 1639 07/05/20 0434  WBC 3.8* 3.6*  NEUTROABS 1.8  --   HGB 10.5* 10.5*  HCT 33.3* 33.4*  MCV 85.2 85.2  PLT 54* 57*   Basic Metabolic Panel: Recent Labs  Lab 07/04/20 1315 07/05/20 0434  NA 136 141  K 4.5 3.8  CL 95* 99  CO2 27 26  GLUCOSE 131* 127*  BUN 15 24*  CREATININE 3.65* 5.36*  CALCIUM 8.4* 9.0  MG 2.3  --    GFR: Estimated Creatinine Clearance: 11.1 mL/min (A) (by C-G formula based on SCr of 5.36 mg/dL (H)). Liver Function Tests: Recent Labs  Lab 07/04/20 1315  AST 57*  ALT 22  ALKPHOS 116  BILITOT 1.9*  PROT 6.7  ALBUMIN 2.9*   No results for input(s): LIPASE, AMYLASE in the last 168 hours. Recent Labs  Lab 07/04/20 1315 07/05/20 1230  AMMONIA 97* 49*   Coagulation Profile: Recent Labs  Lab 07/04/20 1639  INR 1.1   Cardiac Enzymes: No results for input(s): CKTOTAL, CKMB, CKMBINDEX, TROPONINI in the last 168 hours. BNP (last 3 results) No results for input(s): PROBNP in the last 8760 hours. HbA1C: Recent Labs    07/04/20 1639  HGBA1C 6.9*   CBG: Recent Labs  Lab 07/04/20 2155 07/05/20 0117 07/05/20 0733 07/05/20 1058  GLUCAP 218* 193* 77 135*   Lipid Profile: No results for input(s): CHOL, HDL, LDLCALC, TRIG, CHOLHDL, LDLDIRECT in the last 72 hours. Thyroid Function Tests: Recent Labs    07/05/20 0434  TSH 0.509   Anemia Panel: Recent Labs    07/05/20 0434  VITAMINB12 2,158*   Sepsis Labs: Recent Labs  Lab 07/04/20 1315 07/04/20 1639  LATICACIDVEN 2.8* 3.0*    Recent Results (from the past 240 hour(s))  Resp Panel by RT-PCR (Flu A&B, Covid) Nasopharyngeal Swab     Status: None   Collection Time: 07/04/20  1:59 PM   Specimen: Nasopharyngeal Swab; Nasopharyngeal(NP) swabs in vial transport medium  Result Value Ref Range Status   SARS Coronavirus 2 by RT PCR NEGATIVE NEGATIVE Final    Comment: (NOTE) SARS-CoV-2 target nucleic acids are NOT DETECTED.  The SARS-CoV-2  RNA is generally detectable in upper respiratory specimens during the acute phase of infection. The lowest concentration of SARS-CoV-2 viral copies this assay can detect is 138 copies/mL. A negative result does not preclude SARS-Cov-2 infection and should not be used as the sole basis for treatment or other patient management decisions. A negative result may occur with  improper specimen collection/handling, submission of specimen other than nasopharyngeal swab, presence of viral mutation(s) within the areas targeted by this assay, and inadequate number of viral copies(<138 copies/mL). A negative result must be combined with clinical observations, patient history, and epidemiological information. The expected result is Negative.  Fact Sheet for Patients:  EntrepreneurPulse.com.au  Fact Sheet for Healthcare Providers:  IncredibleEmployment.be  This test is no t yet approved or cleared by the [place] FDA and  has been  authorized for detection and/or diagnosis of SARS-CoV-2 by FDA under an Emergency Use Authorization (EUA). This EUA will remain  in effect (meaning this test can be used) for the duration of the COVID-19 declaration under Section 564(b)(1) of the Act, 21 U.S.C.section 360bbb-3(b)(1), unless the authorization is terminated  or revoked sooner.       Influenza A by PCR NEGATIVE NEGATIVE Final   Influenza B by PCR NEGATIVE NEGATIVE Final    Comment: (NOTE) The Xpert Xpress SARS-CoV-2/FLU/RSV plus assay is intended as an aid in the diagnosis of influenza from Nasopharyngeal swab specimens and should not be used as a sole basis for treatment. Nasal washings and aspirates are unacceptable for Xpert Xpress SARS-CoV-2/FLU/RSV testing.  Fact Sheet for Patients: EntrepreneurPulse.com.au  Fact Sheet for Healthcare Providers: IncredibleEmployment.be  This test is not yet approved or cleared by the  [place] FDA and has been authorized for detection and/or diagnosis of SARS-CoV-2 by FDA under an Emergency Use Authorization (EUA). This EUA will remain in effect (meaning this test can be used) for the duration of the COVID-19 declaration under Section 564(b)(1) of the Act, 21 U.S.C. section 360bbb-3(b)(1), unless the authorization is terminated or revoked.  Performed at [facility], [place] [address], [place], [place] [address]      Radiology Studies: CT Head Wo Contrast  Result Date: 07/04/2020 CLINICAL DATA:  Delirium. EXAM: CT HEAD WITHOUT CONTRAST TECHNIQUE: Contiguous axial images were obtained from the base of the skull through the vertex without intravenous contrast. COMPARISON:  Head CT 05/02/2020 FINDINGS: Brain: Mild generalized cerebral atrophy. Mild ill-defined hypoattenuation within the cerebral white matter is nonspecific, but compatible with chronic small vessel ischemic disease. There is no acute intracranial hemorrhage. No demarcated cortical infarct. No extra-axial fluid collection. No evidence of intracranial mass. No midline shift. Vascular: No hyperdense vessel.  Atherosclerotic calcifications. Skull: Normal. Negative for fracture or focal lesion. Sinuses/Orbits: Visualized orbits show no acute finding. Mild mucosal thickening within the imaged right maxillary sinus. Other: Right mastoid effusion. IMPRESSION: No evidence of acute intracranial abnormality. Mild cerebral atrophy and chronic small vessel ischemic disease, stable as compared to the head CT of 05/02/2020. Mild right maxillary sinus mucosal thickening. Right mastoid effusion. Electronically Signed   By: Kellie Simmering DO   On: 07/04/2020 16:05   DG Chest Portable 1 View  Result Date: 07/04/2020 CLINICAL DATA:  Altered mental status. EXAM: PORTABLE CHEST 1 VIEW COMPARISON:  May 08, 2020. FINDINGS: Stable cardiomegaly with mild central pulmonary vascular congestion. No pneumothorax or pleural effusion  is noted. No consolidative process is noted. Bony thorax is unremarkable. IMPRESSION: Stable cardiomegaly with mild central pulmonary vascular congestion. Electronically Signed   By: Marijo Conception M.D.   On: 07/04/2020 14:34   Scheduled Meds:  calcium acetate  667 mg Oral TID WC   carvedilol  12.5 mg Oral BID WC   insulin aspart  0-5 Units Subcutaneous QHS   insulin aspart  0-6 Units Subcutaneous TID WC   insulin glargine  14 Units Subcutaneous Q2200   lactulose  30 g Oral TID   multivitamin with minerals  1 tablet Oral Daily   pantoprazole  40 mg Oral BID   rifaximin  550 mg Oral BID   rosuvastatin  5 mg Oral Daily   Continuous Infusions:   LOS: 1 day    Time spent: 25 mins    Shawna Clamp, MD [facility]   If 7PM-7AM, please contact night-coverage

## 2020-07-05 NOTE — Progress Notes (Signed)
   07/05/20 1600  What Happened  Was fall witnessed? No  Was patient injured? No  Patient found on floor  Found by Staff-comment (nt)  Stated prior activity other (comment) (adlib)  Follow Up  MD notified yes Shawna Clamp)  Time MD notified [phone]  Family notified Yes - comment (son)  Time family notified 1530  Additional tests No  Progress note created (see row info) Yes  Adult Fall Risk Assessment  Risk Factor Category (scoring not indicated) Fall has occurred during this admission (document High fall risk)  Age 69  Fall History: Fall within 6 months prior to admission 5  Elimination; Bowel and/or Urine Incontinence 2  Elimination; Bowel and/or Urine Urgency/Frequency 2  Medications: includes PCA/Opiates, Anti-convulsants, Anti-hypertensives, Diuretics, Hypnotics, Laxatives, Sedatives, and Psychotropics 0  Patient Care Equipment 0  Mobility-Assistance 2  Mobility-Gait 2  Mobility-Sensory Deficit 0  Altered awareness of immediate physical environment 0  Impulsiveness 2  Lack of understanding of one's physical/cognitive limitations 4  Total Score 20  Patient Fall Risk Level High fall risk  Adult Fall Risk Interventions  Required Bundle Interventions *See Row Information* High fall risk - low, moderate, and high requirements implemented  Additional Interventions PT/OT need assessed if change in mobility from baseline;Use of appropriate toileting equipment (bedpan, BSC, etc.)  Screening for Fall Injury Risk (To be completed on HIGH fall risk patients) - Assessing Need for Low Bed  Risk For Fall Injury- Low Bed Criteria Previous fall this admission  Will Implement Low Bed and Floor Mats Yes  Screening for Fall Injury Risk (To be completed on HIGH fall risk patients who do not meet crieteria for Low Bed) - Assessing Need for Floor Mats Only  Risk For Fall Injury- Criteria for Floor Mats Noncompliant with safety precautions  Will Implement Floor Mats Yes  Pain Assessment  Pain  Scale 0-10  Pain Score 0  Neurological  Neuro (WDL) X  Level of Consciousness Alert  Orientation Level Oriented X4  Cognition Appropriate at baseline  Speech Clear  Glasgow Coma Scale  Eye Opening 4  Best Verbal Response (NON-intubated) 5  Best Motor Response 6  Glasgow Coma Scale Score 15  Musculoskeletal  Musculoskeletal (WDL) X  Assistive Device Front wheel walker  Generalized Weakness Yes  Weight Bearing Restrictions No  Integumentary  Integumentary (WDL) WDL

## 2020-07-05 NOTE — ED Notes (Signed)
hospitalist at bedside

## 2020-07-06 LAB — COMPREHENSIVE METABOLIC PANEL
ALT: 20 U/L (ref 0–44)
AST: 36 U/L (ref 15–41)
Albumin: 2.7 g/dL — ABNORMAL LOW (ref 3.5–5.0)
Alkaline Phosphatase: 97 U/L (ref 38–126)
Anion gap: 18 — ABNORMAL HIGH (ref 5–15)
BUN: 38 mg/dL — ABNORMAL HIGH (ref 8–23)
CO2: 26 mmol/L (ref 22–32)
Calcium: 9.9 mg/dL (ref 8.9–10.3)
Chloride: 100 mmol/L (ref 98–111)
Creatinine, Ser: 7.96 mg/dL — ABNORMAL HIGH (ref 0.61–1.24)
GFR, Estimated: 7 mL/min — ABNORMAL LOW (ref >60)
Glucose, Bld: 198 mg/dL — ABNORMAL HIGH (ref 70–99)
Potassium: 4.3 mmol/L (ref 3.5–5.1)
Sodium: 144 mmol/L (ref 135–145)
Total Bilirubin: 1.5 mg/dL — ABNORMAL HIGH (ref 0.3–1.2)
Total Protein: 6.6 g/dL (ref 6.5–8.1)

## 2020-07-06 LAB — GLUCOSE, CAPILLARY
Glucose-Capillary: 142 mg/dL — ABNORMAL HIGH (ref 70–99)
Glucose-Capillary: 342 mg/dL — ABNORMAL HIGH (ref 70–99)
Glucose-Capillary: 140 mg/dL — ABNORMAL HIGH (ref 70–99)
Glucose-Capillary: 243 mg/dL — ABNORMAL HIGH (ref 70–99)

## 2020-07-06 LAB — CBC
HCT: 33.1 % — ABNORMAL LOW (ref 39.0–52.0)
Hemoglobin: 10.4 g/dL — ABNORMAL LOW (ref 13.0–17.0)
MCH: 27.1 pg (ref 26.0–34.0)
MCHC: 31.4 g/dL (ref 30.0–36.0)
MCV: 86.2 fL (ref 80.0–100.0)
Platelets: 61 10*3/uL — ABNORMAL LOW (ref 150–400)
RBC: 3.84 MIL/uL — ABNORMAL LOW (ref 4.22–5.81)
RDW: 15.7 % — ABNORMAL HIGH (ref 11.5–15.5)
WBC: 3.7 10*3/uL — ABNORMAL LOW (ref 4.0–10.5)
nRBC: 0 % (ref 0.0–0.2)

## 2020-07-06 LAB — AMMONIA: Ammonia: 63 umol/L — ABNORMAL HIGH (ref 9–35)

## 2020-07-06 LAB — MAGNESIUM: Magnesium: 3 mg/dL — ABNORMAL HIGH (ref 1.7–2.4)

## 2020-07-06 LAB — PHOSPHORUS: Phosphorus: 5.3 mg/dL — ABNORMAL HIGH (ref 2.5–4.6)

## 2020-07-06 MED ORDER — CHLORHEXIDINE GLUCONATE CLOTH 2 % EX PADS
6.0000 | MEDICATED_PAD | Freq: Every day | CUTANEOUS | Status: DC
  Administered 2020-07-06: 6 via TOPICAL

## 2020-07-06 MED ORDER — SEVELAMER CARBONATE 800 MG PO TABS
800.0000 mg | ORAL_TABLET | Freq: Three times a day (TID) | ORAL | Status: DC
  Administered 2020-07-07 (×2): 800 mg via ORAL
  Filled 2020-07-06 (×2): qty 1

## 2020-07-06 NOTE — Progress Notes (Signed)
   07/06/20 1824  Assess: MEWS Score  Temp 97.7 F (36.5 C)  BP (!) 80/32  Pulse Rate 74  Resp 18  SpO2 100 %  O2 Device Room Air  Assess: MEWS Score  MEWS Temp 0  MEWS Systolic 2  MEWS Pulse 0  MEWS RR 0  MEWS LOC 0  MEWS Score 2  MEWS Score Color Yellow  Assess: if the MEWS score is Yellow or Red  Were vital signs taken at a resting state? Yes  Focused Assessment No change from prior assessment  Early Detection of Sepsis Score *See Row Information* Low  MEWS guidelines implemented *See Row Information* Yes  Treat  MEWS Interventions Administered scheduled meds/treatments  Pain Scale 0-10  Pain Score 0  Take Vital Signs  Increase Vital Sign Frequency  Yellow: Q 2hr X 2 then Q 4hr X 2, if remains yellow, continue Q 4hrs  Escalate  MEWS: Escalate Yellow: discuss with charge nurse/RN and consider discussing with provider and RRT  Notify: Charge Nurse/RN  Name of Charge Nurse/RN Notified Nichola Gauntlettt  Date Charge Nurse/RN Notified 07/06/20  Time Charge Nurse/RN Notified 1824  Notify: Provider  Provider Name/Title Shawna Clamp  Date Provider Notified 07/06/20  Time Provider Notified 1825  Notification Type Page  Notification Reason Change in status  Response No new orders  Date of Provider Response 07/06/20  Time of Provider Response 1827  Document  Patient Outcome Stabilized after interventions  Progress note created (see row info) Yes

## 2020-07-06 NOTE — Progress Notes (Signed)
PROGRESS NOTE    William [name]  YQM:[id number] DOB: 05-25-1951 DOA: 07/04/2020 PCP: Horald Pollen, MD   Brief Narrative:  This 70 years old male with medical history significant for end-stage renal disease on hemodialysis, end-stage liver disease, liver cirrhosis on transplant list,  history of hypertension presented in the emergency department from hemodialysis unit because of altered mental status. Patient only speaks Arabic,  history is obtained from patient's son.  Patient mental status is improving.   Patient is admitted for hepatic encephalopathy could be secondary to inadequate medications( ran out of Rifaximin). Patient has fallen while going to the bathroom in the hospital. Denies any head injury or visible injuries. PT and OT ordered.  Patient is going to get hemodialysis today,  nephrology consulted.   Assessment & Plan:   Active Problems:   Metabolic encephalopathy  Acute mental status change with hypotension secondary to metabolic encephalopathy -suspect secondary to lack of understanding about medication requirement. -Ammonia elevated at 97-trended down to 49- up to 21 -Educated son at bedside the importance of lactulose and rifaximin daily chronic use. -Continue Rifaximin 550 mg p.o. twice daily -Continue Lactulose 30 g p.o. 3 times daily  -Patient had two bowel movements.  History of acute GI bleed -CBC is reassuring and H&H is at baseline. -CBC in the a.m.  Hyperlipidemia -Crestor 5 mg p.o. daily   Insulin-dependent diabetes mellitus  Continue home glargine 14 units subcutaneous daily -Insulin SSI -Diet: Carb/heart  End-stage renal disease on hemodialysis Continue hemodialysis as scheduled. Nephrology consulted, he is going to have hemodialysis today.  Hypertension in the ED and on admission -Previously on Coreg 12.5 twice daily and this was discontinued at previous admission due to low normal pressure -Resumed Coreg 12.5 mg twice daily,  instructions to hold if SBP less than 100   DVT prophylaxis: SCDS Code Status: Full code. Family Communication: Spoke with son at bedside Disposition Plan:  Status is: Inpatient  Remains inpatient appropriate because:Inpatient level of care appropriate due to severity of illness   Dispo: The patient is from: Home              Anticipated d/c is to: Home with home services.              Anticipated d/c date is:1 days              Patient currently is not medically stable to d/c.   Consultants:    None  Procedures: Antimicrobials:  Anti-infectives (From admission, onward)   Start     Dose/Rate Route Frequency Ordered Stop   07/04/20 1730  rifaximin (XIFAXAN) tablet 550 mg        550 mg Oral 2 times daily 07/04/20 1725        Subjective: Patient was seen and examined at bedside. Overnight events noted. Patient appears alert and oriented x 2.  Patient has fallen overnight while going to the bathroom,  denies any head injury.  He denies any pain. Patient is going to have hemodialysis today.  Objective: Vitals:   07/05/20 2156 07/06/20 0104 07/06/20 0550 07/06/20 0939  BP: (!) 96/52 93/60 (!) 111/51 (!) 170/94  Pulse:   83 80  Resp:   17 18  Temp: 98.7 F (37.1 C) 98.2 F (36.8 C) 98.3 F (36.8 C) 98.6 F (37 C)  TempSrc: Oral Oral    SpO2:  99% 96% 95%  Weight:      Height:        Intake/Output Summary (Last  24 hours) at 07/06/2020 1354 Last data filed at 07/06/2020 0817 Gross per 24 hour  Intake 440 ml  Output 0 ml  Net 440 ml   Filed Weights   07/05/20 1300 07/05/20 2102  Weight: 59.7 kg 59.7 kg    Examination:  General exam: Appears calm and comfortable , not in any distress Respiratory system: Clear to auscultation. Respiratory effort normal. Cardiovascular system: S1 & S2 heard, RRR. No JVD, murmurs, rubs, gallops or clicks. No pedal edema. Gastrointestinal system: Abdomen is nondistended, soft and nontender. No organomegaly or masses felt.    Normal bowel sounds heard. Central nervous system: Alert and oriented. No focal neurological deficits. Extremities: No edema, no cyanosis, no clubbing. Skin: No rashes, lesions or ulcers Psychiatry: Judgement and insight appear normal. Mood & affect appropriate.     Data Reviewed: I have personally reviewed following labs and imaging studies  CBC: Recent Labs  Lab 07/04/20 1639 07/05/20 0434 07/06/20 0802  WBC 3.8* 3.6* 3.7*  NEUTROABS 1.8  --   --   HGB 10.5* 10.5* 10.4*  HCT 33.3* 33.4* 33.1*  MCV 85.2 85.2 86.2  PLT 54* 57* 61*   Basic Metabolic Panel: Recent Labs  Lab 07/04/20 1315 07/05/20 0434 07/06/20 0802  NA 136 141 144  K 4.5 3.8 4.3  CL 95* 99 100  CO2 27 26 26   GLUCOSE 131* 127* 198*  BUN 15 24* 38*  CREATININE 3.65* 5.36* 7.96*  CALCIUM 8.4* 9.0 9.9  MG 2.3  --  3.0*  PHOS  --   --  5.3*   GFR: Estimated Creatinine Clearance: 7.5 mL/min (A) (by C-G formula based on SCr of 7.96 mg/dL (H)). Liver Function Tests: Recent Labs  Lab 07/04/20 1315 07/06/20 0802  AST 57* 36  ALT 22 20  ALKPHOS 116 97  BILITOT 1.9* 1.5*  PROT 6.7 6.6  ALBUMIN 2.9* 2.7*   No results for input(s): LIPASE, AMYLASE in the last 168 hours. Recent Labs  Lab 07/04/20 1315 07/05/20 1230 07/06/20 0802  AMMONIA 97* 49* 63*   Coagulation Profile: Recent Labs  Lab 07/04/20 1639  INR 1.1   Cardiac Enzymes: No results for input(s): CKTOTAL, CKMB, CKMBINDEX, TROPONINI in the last 168 hours. BNP (last 3 results) No results for input(s): PROBNP in the last 8760 hours. HbA1C: Recent Labs    07/04/20 1639  HGBA1C 6.9*   CBG: Recent Labs  Lab 07/05/20 1724 07/05/20 2103 07/05/20 2150 07/06/20 0651 07/06/20 1127  GLUCAP 228* 174* 151* 142* 342*   Lipid Profile: No results for input(s): CHOL, HDL, LDLCALC, TRIG, CHOLHDL, LDLDIRECT in the last 72 hours. Thyroid Function Tests: Recent Labs    07/05/20 0434  TSH 0.509   Anemia Panel: Recent Labs     07/05/20 0434  VITAMINB12 2,158*   Sepsis Labs: Recent Labs  Lab 07/04/20 1315 07/04/20 1639  LATICACIDVEN 2.8* 3.0*    Recent Results (from the past 240 hour(s))  Resp Panel by RT-PCR (Flu A&B, Covid) Nasopharyngeal Swab     Status: None   Collection Time: 07/04/20  1:59 PM   Specimen: Nasopharyngeal Swab; Nasopharyngeal(NP) swabs in vial transport medium  Result Value Ref Range Status   SARS Coronavirus 2 by RT PCR NEGATIVE NEGATIVE Final    Comment: (NOTE) SARS-CoV-2 target nucleic acids are NOT DETECTED.  The SARS-CoV-2 RNA is generally detectable in upper respiratory specimens during the acute phase of infection. The lowest concentration of SARS-CoV-2 viral copies this assay can detect is 138 copies/mL. A negative  result does not preclude SARS-Cov-2 infection and should not be used as the sole basis for treatment or other patient management decisions. A negative result may occur with  improper specimen collection/handling, submission of specimen other than nasopharyngeal swab, presence of viral mutation(s) within the areas targeted by this assay, and inadequate number of viral copies(<138 copies/mL). A negative result must be combined with clinical observations, patient history, and epidemiological information. The expected result is Negative.  Fact Sheet for Patients:  EntrepreneurPulse.com.au  Fact Sheet for Healthcare Providers:  IncredibleEmployment.be  This test is no t yet approved or cleared by the [place] FDA and  has been authorized for detection and/or diagnosis of SARS-CoV-2 by FDA under an Emergency Use Authorization (EUA). This EUA will remain  in effect (meaning this test can be used) for the duration of the COVID-19 declaration under Section 564(b)(1) of the Act, 21 U.S.C.section 360bbb-3(b)(1), unless the authorization is terminated  or revoked sooner.       Influenza A by PCR NEGATIVE NEGATIVE Final    Influenza B by PCR NEGATIVE NEGATIVE Final    Comment: (NOTE) The Xpert Xpress SARS-CoV-2/FLU/RSV plus assay is intended as an aid in the diagnosis of influenza from Nasopharyngeal swab specimens and should not be used as a sole basis for treatment. Nasal washings and aspirates are unacceptable for Xpert Xpress SARS-CoV-2/FLU/RSV testing.  Fact Sheet for Patients: EntrepreneurPulse.com.au  Fact Sheet for Healthcare Providers: IncredibleEmployment.be  This test is not yet approved or cleared by the [place] FDA and has been authorized for detection and/or diagnosis of SARS-CoV-2 by FDA under an Emergency Use Authorization (EUA). This EUA will remain in effect (meaning this test can be used) for the duration of the COVID-19 declaration under Section 564(b)(1) of the Act, 21 U.S.C. section 360bbb-3(b)(1), unless the authorization is terminated or revoked.  Performed at [facility], [place] [address] [place], [place] [address]   C Difficile Quick Screen w PCR reflex     Status: None   Collection Time: 07/05/20  9:45 PM   Specimen: STOOL  Result Value Ref Range Status   C Diff antigen NEGATIVE NEGATIVE Final   C Diff toxin NEGATIVE NEGATIVE Final   C Diff interpretation No C. difficile detected.  Final    Comment: Performed at [facility], [place] [address] [place], [place] [address]     Radiology Studies: CT Head Wo Contrast  Result Date: 07/04/2020 CLINICAL DATA:  Delirium. EXAM: CT HEAD WITHOUT CONTRAST TECHNIQUE: Contiguous axial images were obtained from the base of the skull through the vertex without intravenous contrast. COMPARISON:  Head CT 05/02/2020 FINDINGS: Brain: Mild generalized cerebral atrophy. Mild ill-defined hypoattenuation within the cerebral white matter is nonspecific, but compatible with chronic small vessel ischemic disease. There is no acute intracranial hemorrhage. No demarcated cortical infarct. No  extra-axial fluid collection. No evidence of intracranial mass. No midline shift. Vascular: No hyperdense vessel.  Atherosclerotic calcifications. Skull: Normal. Negative for fracture or focal lesion. Sinuses/Orbits: Visualized orbits show no acute finding. Mild mucosal thickening within the imaged right maxillary sinus. Other: Right mastoid effusion. IMPRESSION: No evidence of acute intracranial abnormality. Mild cerebral atrophy and chronic small vessel ischemic disease, stable as compared to the head CT of 05/02/2020. Mild right maxillary sinus mucosal thickening. Right mastoid effusion. Electronically Signed   By: Kellie Simmering DO   On: 07/04/2020 16:05   DG Chest Portable 1 View  Result Date: 07/04/2020 CLINICAL DATA:  Altered mental status. EXAM: PORTABLE  CHEST 1 VIEW COMPARISON:  May 08, 2020. FINDINGS: Stable cardiomegaly with mild central pulmonary vascular congestion. No pneumothorax or pleural effusion is noted. No consolidative process is noted. Bony thorax is unremarkable. IMPRESSION: Stable cardiomegaly with mild central pulmonary vascular congestion. Electronically Signed   By: Marijo Conception M.D.   On: 07/04/2020 14:34   Scheduled Meds: . calcium acetate  667 mg Oral TID WC  . carvedilol  12.5 mg Oral BID WC  . Chlorhexidine Gluconate Cloth  6 each Topical Q0600  . insulin aspart  0-5 Units Subcutaneous QHS  . insulin aspart  0-6 Units Subcutaneous TID WC  . insulin glargine  14 Units Subcutaneous Q2200  . lactulose  30 g Oral TID  . multivitamin with minerals  1 tablet Oral Daily  . pantoprazole  40 mg Oral BID  . rifaximin  550 mg Oral BID  . rosuvastatin  5 mg Oral Daily   Continuous Infusions:   LOS: 2 days    Time spent: 25 mins    Shawna Clamp, MD [facility]   If 7PM-7AM, please contact night-coverage

## 2020-07-06 NOTE — Plan of Care (Signed)
°  Problem: Clinical Measurements: Goal: Ability to maintain clinical measurements within normal limits will improve Outcome: Progressing   Problem: Elimination: Goal: Will not experience complications related to bowel motility Outcome: Progressing   Problem: Safety: Goal: Ability to remain free from injury will improve Outcome: Progressing   Problem: Skin Integrity: Goal: Risk for impaired skin integrity will decrease Outcome: Progressing

## 2020-07-06 NOTE — Consult Note (Signed)
[facility] Renal Consultation Note    Indication for Consultation:  Management of ESRD/hemodialysis; anemia, hypertension/volume and secondary hyperparathyroidism PCP:  HPI: William [name] is a 69 y.o. male ESRD 2/2 DM Nephroslerosis, 1st HD 01/19/18. DM, diastolic HF grade 1, H/O [place] disease followed by oncology/pulmonary, non-alcoholic cirrhosis on transplant list at [facility], [place], pancytopenia. Patient is compliant with HD, last HD 07/04/2020. EDW recently raised. He is blind in one eye. He only speaks Arabic.   Patient presented to [facility] 07/04/2020 with AMS. Apparently patient was on Rifamaxin, ran out of medications or not given-last dose 07/02/2020. Ammonia on admission 97. He has been admitted with acute encephalopathy.   Patient seen on HD unfortunately initially patient because hypotensive requiring NS. BP now stable but he is refusing to speak to remote interpretor. He usually knows me and is very cooperative. HPI gathered from EMR.   Past Medical History:  Diagnosis Date   Anemia    Blind one eye    CHF (congestive heart failure) ([place])    Cirrhosis (HCC)    ESRD on hemodialysis ([place])    [place]'s disease ([place])    on outpatient plasmapheresis/notes 11/20/2017   Grade I diastolic dysfunction 61/44/3154   History of anemia due to chronic kidney disease    History of blood transfusion X 1   "UGIB; low blood count"   History of plasmapheresis    "qod" (11/20/2017)   Hypertension    Pancytopenia ([place]) 08/20/2017   Type II diabetes mellitus ([place])    Past Surgical History:  Procedure Laterality Date   AV FISTULA PLACEMENT Left 12/26/2017   Procedure: LEFT BRACHIOCEPHALIC ARTERIOVENOUS (AV) FISTULA CREATION;  Surgeon: Conrad Jack, MD;  Location: [place];  Service: Vascular;  Laterality: Left;   BASCILIC VEIN TRANSPOSITION Left 02/16/2018   Procedure: BRACHIOBASILIC VEIN TRANSPOSITION SECOND STAGE;  Surgeon: Conrad Maricopa, MD;  Location: [facility] OR;   Service: Vascular;  Laterality: Left;   CATARACT EXTRACTION W/ INTRAOCULAR LENS  IMPLANT, BILATERAL Bilateral    ESOPHAGEAL BANDING N/A 04/01/2018   Procedure: ESOPHAGEAL BANDING;  Surgeon: Otis Brace, MD;  Location: [facility];  Service: Gastroenterology;  Laterality: N/A;   ESOPHAGEAL BANDING N/A 06/29/2018   Procedure: ESOPHAGEAL BANDING;  Surgeon: Otis Brace, MD;  Location: [facility];  Service: Gastroenterology;  Laterality: N/A;   ESOPHAGOGASTRODUODENOSCOPY (EGD) WITH PROPOFOL N/A 04/01/2018   Procedure: ESOPHAGOGASTRODUODENOSCOPY (EGD) WITH PROPOFOL;  Surgeon: Otis Brace, MD;  Location: [facility];  Service: Gastroenterology;  Laterality: N/A;   ESOPHAGOGASTRODUODENOSCOPY (EGD) WITH PROPOFOL N/A 06/29/2018   Procedure: ESOPHAGOGASTRODUODENOSCOPY (EGD) WITH PROPOFOL;  Surgeon: Otis Brace, MD;  Location: [facility];  Service: Gastroenterology;  Laterality: N/A;   ESOPHAGOGASTRODUODENOSCOPY (EGD) WITH PROPOFOL N/A 09/30/2018   Procedure: ESOPHAGOGASTRODUODENOSCOPY (EGD) WITH PROPOFOL;  Surgeon: Otis Brace, MD;  Location: [facility];  Service: Gastroenterology;  Laterality: N/A;   ESOPHAGOGASTRODUODENOSCOPY (EGD) WITH PROPOFOL N/A 04/20/2019   Procedure: ESOPHAGOGASTRODUODENOSCOPY (EGD) WITH PROPOFOL;  Surgeon: Otis Brace, MD;  Location: [facility];  Service: Gastroenterology;  Laterality: N/A;   ESOPHAGOGASTRODUODENOSCOPY (EGD) WITH PROPOFOL N/A 04/17/2020   Procedure: ESOPHAGOGASTRODUODENOSCOPY (EGD) WITH PROPOFOL;  Surgeon: Wonda Horner, MD;  Location: [place];  Service: Gastroenterology;  Laterality: N/A;   I & D EXTREMITY Left 02/18/2018   Procedure: IRRIGATION AND DEBRIDEMENT LEFT ARM;  Surgeon: Serafina Mitchell, MD;  Location: [facility] OR;  Service: Vascular;  Laterality: Left;   INSERTION OF DIALYSIS CATHETER N/A 01/19/2018   Procedure: INSERTION OF DIALYSIS CATHETER - RIGHT INTERNAL JUGULAR PLACEMENT;  Surgeon: Rosetta Posner,  MD;  Location: [facility] OR;  Service: Vascular;  Laterality: N/A;   IR FLUORO GUIDE CV LINE RIGHT  11/10/2017   IR PARACENTESIS  12/10/2017   IR PARACENTESIS  12/26/2017   IR REMOVAL TUN CV CATH W/O FL  11/28/2017   IR THROMBECTOMY AV FISTULA W/THROMBOLYSIS/PTA INC/SHUNT/IMG LEFT Left 06/02/2019   IR US GUIDE VASC ACCESS LEFT  06/02/2019   IR US GUIDE VASC ACCESS RIGHT  11/10/2017   NECK SURGERY  2007   "back of neck; no hardware in there"   Family History  Problem Relation Age of Onset   Diabetes Mellitus II Sister    Diabetes Sister    Stroke Brother    Heart attack Brother    Social History:  reports that he has never smoked. He has never used smokeless tobacco. He reports that he does not drink alcohol and does not use drugs. Allergies  Allergen Reactions   Heparin Other (See Comments)    Patient is Muslim and is not permitted Pork derative due to religious belief)   Pork-Derived Products Other (See Comments)    Patient does not eat pork due to religious beliefs   Prior to Admission medications   Medication Sig Start Date End Date Taking? Authorizing Provider  calcium acetate (PHOSLO) 667 MG capsule Take 1 capsule (667 mg total) by mouth 3 (three) times daily with meals. 05/11/20  Yes Alekh, Kshitiz, MD  glucose blood (ONETOUCH VERIO) test strip USE 1 STRIP TO CHECK GLUCOSE THREE TIMES DAILY. DX:E11.65 01/31/20  Yes Elayne Snare, MD  insulin degludec (TRESIBA FLEXTOUCH) 100 UNIT/ML SOPN FlexTouch Pen Inject 0.14 mLs (14 Units total) into the skin daily. Inject 14 units under the skin once daily. 03/02/19  Yes Elayne Snare, MD  Insulin Pen Needle (ADVOCATE INSULIN PEN NEEDLES) 31G X 5 MM MISC Use four times daily to inject insulin 04/27/18  Yes Elayne Snare, MD  insulin regular (NOVOLIN R,HUMULIN R) 100 units/mL injection Inject 6-8 Units into the skin See admin instructions. INJECT 6 UNITS UNDER THE SKIN AT BREAKFAST, 8 UNITS AT LUNCH AND 8 UNITS AT DINNER.   Yes [provider]  lactulose (CHRONULAC) 10 GM/15ML solution Take 30 g by mouth 2 (two) times daily.  05/12/20  Yes [provider]  lidocaine-prilocaine (EMLA) cream Apply 1 application topically Every Tuesday,Thursday,and Saturday with dialysis. 08/24/18  Yes [provider]  Methoxy PEG-Epoetin Beta (MIRCERA IJ) 1 Dose See admin instructions. Dialysis gives this medication 05/09/20 05/08/21 Yes [provider]  Multiple Vitamins-Minerals (ONE-A-DAY MENS 50+ PO) Take 1 tablet by mouth daily.   Yes [provider]  ondansetron (ZOFRAN ODT) 4 MG disintegrating tablet Take 1 tablet (4 mg total) by mouth 2 (two) times daily as needed for nausea or vomiting. 05/01/20  Yes [name], [place] C, PA-C  ONE TOUCH LANCETS MISC Use to test blood sugar three times daily 04/27/18  Yes Elayne Snare, MD  pantoprazole (PROTONIX) 40 MG tablet Take 1 tablet (40 mg total) by mouth 2 (two) times daily. 05/04/20  Yes Domenic Polite, MD  rifaximin (XIFAXAN) 550 MG TABS tablet Take 550 mg by mouth 2 (two) times daily.  05/12/20  Yes [provider]  rosuvastatin (CRESTOR) 5 MG tablet Take 1 tablet by mouth once daily Patient taking differently: Take 5 mg by mouth daily.  04/25/20  Yes Elayne Snare, MD  lactulose (CHRONULAC) 10 GM/15ML solution Take 45 mLs (30 g total) by mouth 3 (three) times daily. Patient  not taking: Reported on 07/04/2020 05/11/20   Aline August, MD   Current Facility-Administered Medications  Medication Dose Route Frequency Provider Last Rate Last Admin   acetaminophen (TYLENOL) tablet 650 mg  650 mg Oral Q6H PRN Shawna Clamp, MD   650 mg at 07/05/20 1741   calcium acetate (PHOSLO) capsule 667 mg  667 mg Oral TID WC Cox, Amy N, DO   667 mg at 07/06/20 1225   carvedilol (COREG) tablet 12.5 mg  12.5 mg Oral BID WC Cox, Amy N, DO   12.5 mg at 07/06/20 0831   Chlorhexidine Gluconate Cloth 2 % PADS 6 each  6 each Topical Q0600 Roney Jaffe, MD   6 each at 07/06/20 1018    insulin aspart (novoLOG) injection 0-5 Units  0-5 Units Subcutaneous QHS Cox, Amy N, DO   2 Units at 07/04/20 2258   insulin aspart (novoLOG) injection 0-6 Units  0-6 Units Subcutaneous TID WC Cox, Amy N, DO   4 Units at 07/06/20 1225   insulin glargine (LANTUS) injection 14 Units  14 Units Subcutaneous Q2200 Cox, Amy N, DO   14 Units at 07/05/20 0134   lactulose (CHRONULAC) 10 GM/15ML solution 30 g  30 g Oral TID Cox, Amy N, DO   30 g at 07/06/20 1018   multivitamin with minerals tablet 1 tablet  1 tablet Oral Daily Cox, Amy N, DO   1 tablet at 07/05/20 0940   pantoprazole (PROTONIX) EC tablet 40 mg  40 mg Oral BID Cox, Amy N, DO   40 mg at 07/06/20 1017   rifaximin (XIFAXAN) tablet 550 mg  550 mg Oral BID Cox, Amy N, DO   550 mg at 07/06/20 1017   rosuvastatin (CRESTOR) tablet 5 mg  5 mg Oral Daily Cox, Amy N, DO   5 mg at 07/05/20 0940   Labs: Basic Metabolic Panel: Recent Labs  Lab 07/04/20 1315 07/05/20 0434 07/06/20 0802  NA 136 141 144  K 4.5 3.8 4.3  CL 95* 99 100  CO2 27 26 26   GLUCOSE 131* 127* 198*  BUN 15 24* 38*  CREATININE 3.65* 5.36* 7.96*  CALCIUM 8.4* 9.0 9.9  PHOS  --   --  5.3*   Liver Function Tests: Recent Labs  Lab 07/04/20 1315 07/06/20 0802  AST 57* 36  ALT 22 20  ALKPHOS 116 97  BILITOT 1.9* 1.5*  PROT 6.7 6.6  ALBUMIN 2.9* 2.7*   No results for input(s): LIPASE, AMYLASE in the last 168 hours. Recent Labs  Lab 07/04/20 1315 07/05/20 1230 07/06/20 0802  AMMONIA 97* 49* 63*   CBC: Recent Labs  Lab 07/04/20 1639 07/05/20 0434 07/06/20 0802  WBC 3.8* 3.6* 3.7*  NEUTROABS 1.8  --   --   HGB 10.5* 10.5* 10.4*  HCT 33.3* 33.4* 33.1*  MCV 85.2 85.2 86.2  PLT 54* 57* 61*   Cardiac Enzymes: No results for input(s): CKTOTAL, CKMB, CKMBINDEX, TROPONINI in the last 168 hours. CBG: Recent Labs  Lab 07/05/20 1724 07/05/20 2103 07/05/20 2150 07/06/20 0651 07/06/20 1127  GLUCAP 228* 174* 151* 142* 342*   Iron Studies: No results  for input(s): IRON, TIBC, TRANSFERRIN, FERRITIN in the last 72 hours. Studies/Results: CT Head Wo Contrast  Result Date: 07/04/2020 CLINICAL DATA:  Delirium. EXAM: CT HEAD WITHOUT CONTRAST TECHNIQUE: Contiguous axial images were obtained from the base of the skull through the vertex without intravenous contrast. COMPARISON:  Head CT 05/02/2020 FINDINGS: Brain: Mild generalized cerebral atrophy. Mild ill-defined hypoattenuation  within the cerebral white matter is nonspecific, but compatible with chronic small vessel ischemic disease. There is no acute intracranial hemorrhage. No demarcated cortical infarct. No extra-axial fluid collection. No evidence of intracranial mass. No midline shift. Vascular: No hyperdense vessel.  Atherosclerotic calcifications. Skull: Normal. Negative for fracture or focal lesion. Sinuses/Orbits: Visualized orbits show no acute finding. Mild mucosal thickening within the imaged right maxillary sinus. Other: Right mastoid effusion. IMPRESSION: No evidence of acute intracranial abnormality. Mild cerebral atrophy and chronic small vessel ischemic disease, stable as compared to the head CT of 05/02/2020. Mild right maxillary sinus mucosal thickening. Right mastoid effusion. Electronically Signed   By: Kellie Simmering DO   On: 07/04/2020 16:05    ROS: As per HPI otherwise negative.   Physical Exam: Vitals:   07/05/20 2156 07/06/20 0104 07/06/20 0550 07/06/20 0939  BP: (!) 96/52 93/60 (!) 111/51 (!) 170/94  Pulse:   83 80  Resp:   17 18  Temp: 98.7 F (37.1 C) 98.2 F (36.8 C) 98.3 F (36.8 C) 98.6 F (37 C)  TempSrc: Oral Oral    SpO2:  99% 96% 95%  Weight:      Height:         General: Chronically ill appearing older male in no acute distress. Head: Normocephalic, atraumatic, sclera non-icteric, mucus membranes are moist Neck: Supple. JVD not elevated. Lungs: Clear bilaterally to auscultation without wheezes, rales, or rhonchi. Breathing is unlabored. Heart: RRR with  S1 S2. No murmurs, rubs, or gallops appreciated. Abdomen: Soft, non-tender, non-distended with normoactive bowel sounds. No evidence of ascites by exam.  Lower extremities:without edema or ischemic changes, no open wounds  Neuro: Unable to assess. Patient refuses to speak to remote interpreter.. Moves all extremities spontaneously. Psych:  Unable to assess. Patient refuses to speak to remote interpreter.. Dialysis Access: L AVF cannulated at present  Dialysis Orders: [name] T,Th,S 4 hrs 180NRe 400/800 62 kg 3.0 K/ 2.25 Ca AVF -No heparin -Micera 50 mcg IV q 2 weeks (last dose 07/04/2020)   Assessment/Plan: 1.  AMS-Thought to be 2/2 noncompliance/inadequate medication. Patient with H/O cirrhosis on Rifamixin.Ammonia 97 on admission. Down to 59 today. Per primary 2. Non alcoholic Cirrhosis-on liver transplant lis at [facility]. 09/16/2021at [facility].  3.  ESRD -  T,Th,S. HD today on schedule. 4.  Hypertension/volume  - On HD attempting UFG 3.5. Became hypotensive SBP down to 60s, pt is symptomatic. Lowered UFG 2.0 L. Given NS. SBP up to 90s. EDW recently raised at [place] [facility] last week. Try to keep SBP >100. 5.  Anemia  -10.4 Recent ESA dose. Follow HGB.  6.  Metabolic bone disease - C Ca 10.9 PO4 at goal. Low Ca bath, change from calcium acetate to renvela 800 mg PO TIW 7.  Nutrition - Changed to renal diet with fluid restrictions. Albumin low in setting of cirrhosis. Add protein supps 8. H/O GIB-no heparin. HGB stable. 9. DM-per primary.   Bernerd Terhune H. Owens Shark, NP-C 07/06/2020, 2:54 PM  [employer] [phone]

## 2020-07-06 NOTE — Procedures (Signed)
   I was present at this dialysis session, have reviewed the session itself and made  appropriate changes Kelly Splinter MD [place] pager [phone]   07/06/2020, 4:31 PM

## 2020-07-06 NOTE — Evaluation (Signed)
Occupational Therapy Evaluation Patient Details Name: William [name] MRN: [id number] DOB: July 20, 1951 Today's Date: 07/06/2020    History of Present Illness 69 y.o. male with medical history significant for end-stage renal disease on hemodialysis, end-stage liver disease, liver cirrhosis, on transplant list, history of hypertension, presented to the emergency department from HD because of altered mental status.   Clinical Impression   Patient admitted for above and limited by problem list below, including generalized weakness, decreased activity tolerance and impaired balance. Patient oriented to self, place and time, but could not recall birthday, utilized [place] interpreter during session and interpreter reports need to repeat questions for pt to understand/respond.  Unsure of baseline cognitive status, as no family present; but limited historian.  Currently requires supervision for bed mobility, min assist for mobility, min guard for basic transfers and up to min assist for ADLs.  Believe he will benefit from continued OT services while admitted and after dc at [facility] level, given 24/7 support to optimize independence, safety and reduce risk of falls with ADLs, mobility.     Follow Up Recommendations  Home health OT;Supervision/Assistance - 24 hour    Equipment Recommendations  3 in 1 bedside commode    Recommendations for Other Services       Precautions / Restrictions Precautions Precautions: Fall;Other (comment) Precaution Comments: Monitor orthostatics Restrictions Weight Bearing Restrictions: No      Mobility Bed Mobility Overal bed mobility: Needs Assistance Bed Mobility: Supine to Sit;Sit to Supine     Supine to sit: Supervision;HOB elevated Sit to supine: Supervision;HOB elevated   General bed mobility comments: supervision for safety, +rail    Transfers Overall transfer level: Needs assistance Equipment used: None Transfers: Sit to/from Stand Sit to Stand: Min  guard         General transfer comment: min guard for safety    Balance Overall balance assessment: Needs assistance Sitting-balance support: No upper extremity supported;Feet supported Sitting balance-Leahy Scale: Good     Standing balance support: No upper extremity supported;During functional activity Standing balance-Leahy Scale: Poor Standing balance comment: reliant on external support                           ADL either performed or assessed with clinical judgement   ADL Overall ADL's : Needs assistance/impaired     Grooming: Min guard;Standing   Upper Body Bathing: Set up;Sitting   Lower Body Bathing: Minimal assistance;Sit to/from stand   Upper Body Dressing : Set up;Sitting   Lower Body Dressing: Minimal assistance;Sit to/from stand   Toilet Transfer: Minimal assistance;Ambulation Toilet Transfer Details (indicate cue type and reason): hand held assist          Functional mobility during ADLs: Minimal assistance General ADL Comments: pt limited by weakness, balance and cognition      Vision         Perception     Praxis      Pertinent Vitals/Pain Pain Assessment: No/denies pain     Hand Dominance     Extremity/Trunk Assessment Upper Extremity Assessment Upper Extremity Assessment: Overall WFL for tasks assessed   Lower Extremity Assessment Lower Extremity Assessment: Defer to PT evaluation   Cervical / Trunk Assessment Cervical / Trunk Assessment: Normal   Communication Communication Communication: Prefers language other than English (arabic, ipad translater: HDQQI #[id number])   Cognition Arousal/Alertness: Awake/alert Behavior During Therapy: WFL for tasks assessed/performed Overall Cognitive Status: No family/caregiver present to determine baseline cognitive functioning  General Comments: Pt oriented to self, place and date; unable to state birthday.  Admitted for AMS and  presents mildly confused- interpreter reports need to repeat questions for pt to understand and respond.    General Comments  VSS    Exercises     Shoulder Instructions      Home Living Family/patient expects to be discharged to:: Private residence Living Arrangements: Spouse/significant other;Children Available Help at Discharge: Family Type of Home: House Home Access: Stairs to enter     Home Layout: Two level;1/2 bath on main level;Able to live on main level with bedroom/bathroom Alternate Level Stairs-Number of Steps: flight Alternate Level Stairs-Rails: Right;Left Bathroom Shower/Tub: Tub/shower unit (upstairs)         Home Equipment: None   Additional Comments: history taken from previous admission (Oct 2021) due to pt is a poor historian. No family present.      Prior Functioning/Environment Level of Independence: Independent        Comments: reports independent ADLs, mobility         OT Problem List: Decreased strength;Decreased activity tolerance;Impaired balance (sitting and/or standing);Decreased cognition;Decreased safety awareness;Decreased knowledge of use of DME or AE;Decreased knowledge of precautions      OT Treatment/Interventions: Self-care/ADL training;DME and/or AE instruction;Therapeutic exercise;Therapeutic activities;Cognitive remediation/compensation;Balance training;Patient/family education    OT Goals(Current goals can be found in the care plan section) Acute Rehab OT Goals Patient Stated Goal: not stated OT Goal Formulation: With patient Time For Goal Achievement: 07/20/20 Potential to Achieve Goals: Good  OT Frequency: Min 2X/week   Barriers to D/C:            Co-evaluation PT/OT/SLP Co-Evaluation/Treatment: Yes Reason for Co-Treatment: For patient/therapist safety;To address functional/ADL transfers PT goals addressed during session: Mobility/safety with mobility;Balance OT goals addressed during session: ADL's and self-care       AM-PAC OT "6 Clicks" Daily Activity     Outcome Measure Help from another person eating meals?: None Help from another person taking care of personal grooming?: A Little Help from another person toileting, which includes using toliet, bedpan, or urinal?: A Little Help from another person bathing (including washing, rinsing, drying)?: A Little Help from another person to put on and taking off regular upper body clothing?: A Little Help from another person to put on and taking off regular lower body clothing?: A Little 6 Click Score: 19   End of Session Equipment Utilized During Treatment: Gait belt Nurse Communication: Mobility status  Activity Tolerance: Patient tolerated treatment well Patient left: in bed;with call bell/phone within reach;with bed alarm set  OT Visit Diagnosis: Other abnormalities of gait and mobility (R26.89);Muscle weakness (generalized) (M62.81);Other symptoms and signs involving cognitive function                Time: 0263-7858 OT Time Calculation (min): 20 min Charges:  OT General Charges $OT Visit: 1 Visit OT Evaluation $OT Eval Moderate Complexity: 1 Mod  Jolaine Artist, OT Acute Rehabilitation Services Pager [phone] Office [phone]   Delight Stare 07/06/2020, 10:53 AM

## 2020-07-06 NOTE — Evaluation (Signed)
Physical Therapy Evaluation Patient Details Name: William [name] MRN: [id number] DOB: October 15, 1950 Today's Date: 07/06/2020   History of Present Illness  69 y.o. male with medical history significant for end-stage renal disease on hemodialysis, end-stage liver disease, liver cirrhosis, on transplant list, history of hypertension, presented to the emergency department from HD because of altered mental status.    Clinical Impression  Pt admitted with above diagnosis. PTA pt lived at home with family, independent with mobility. On eval, pt required supervision bed mobility, min guard assist sit to stand, and min assist ambulation 150' without AD. He presents with unsteady gait and high risk for falls. BP taken EOB and WFL. Pt without c/o dizziness or nausea. No family present to verify baseline cognitive status. Pt currently with functional limitations due to the deficits listed below. Pt will benefit from skilled PT to increase their independence and safety with mobility to allow discharge to the venue listed below.       Follow Up Recommendations Home health PT;Supervision/Assistance - 24 hour    Equipment Recommendations  None recommended by PT    Recommendations for Other Services       Precautions / Restrictions Precautions Precautions: Fall;Other (comment) Precaution Comments: Monitor orthostatics      Mobility  Bed Mobility Overal bed mobility: Needs Assistance Bed Mobility: Supine to Sit;Sit to Supine     Supine to sit: Supervision;HOB elevated Sit to supine: Supervision;HOB elevated   General bed mobility comments: supervision for safety, +rail    Transfers Overall transfer level: Needs assistance Equipment used: None Transfers: Sit to/from Stand Sit to Stand: Min guard         General transfer comment: min guard for safety  Ambulation/Gait Ambulation/Gait assistance: Min assist Gait Distance (Feet): 150 Feet Assistive device: None;1 person hand held  assist Gait Pattern/deviations: Step-through pattern;Drifts right/left Gait velocity: decreased Gait velocity interpretation: 1.31 - 2.62 ft/sec, indicative of limited community ambulator General Gait Details: unsteady gait, min assist to maintain balance  Stairs            Wheelchair Mobility    Modified Rankin (Stroke Patients Only)       Balance Overall balance assessment: Needs assistance Sitting-balance support: No upper extremity supported;Feet supported Sitting balance-Leahy Scale: Good     Standing balance support: No upper extremity supported;During functional activity Standing balance-Leahy Scale: Poor Standing balance comment: reliant on external support                             Pertinent Vitals/Pain Pain Assessment: No/denies pain    Home Living Family/patient expects to be discharged to:: Private residence Living Arrangements: Spouse/significant other;Children Available Help at Discharge: Family Type of Home: House Home Access: Stairs to enter     Home Layout: Two level;1/2 bath on main level;Able to live on main level with bedroom/bathroom Home Equipment: None Additional Comments: history taken from previous admission (Oct 2021) due to pt is a poor historian. No family present.    Prior Function Level of Independence: Independent               Hand Dominance        Extremity/Trunk Assessment   Upper Extremity Assessment Upper Extremity Assessment: Defer to OT evaluation    Lower Extremity Assessment Lower Extremity Assessment: Generalized weakness    Cervical / Trunk Assessment Cervical / Trunk Assessment: Normal  Communication   Communication: Prefers language other than English (arabic, ipad translater: [id number] #[id number])  Cognition Arousal/Alertness: Awake/alert Behavior During Therapy: WFL for tasks assessed/performed Overall Cognitive Status: No family/caregiver present to determine baseline cognitive  functioning                                 General Comments: Admitted for AMS. Interpreter had to repeat most questions more than once for pt to respond/understand. Oriented to self and place. Was able to state current month. Unable to state birthday.      General Comments General comments (skin integrity, edema, etc.): VSS    Exercises     Assessment/Plan    PT Assessment Patient needs continued PT services  PT Problem List Decreased strength;Decreased activity tolerance;Decreased balance;Decreased knowledge of use of DME;Cardiopulmonary status limiting activity       PT Treatment Interventions DME instruction;Gait training;Stair training;Functional mobility training;Therapeutic activities;Therapeutic exercise;Balance training;Patient/family education    PT Goals (Current goals can be found in the Care Plan section)  Acute Rehab PT Goals Patient Stated Goal: not stated PT Goal Formulation: With patient Time For Goal Achievement: 07/20/20 Potential to Achieve Goals: Good    Frequency Min 3X/week   Barriers to discharge        Co-evaluation PT/OT/SLP Co-Evaluation/Treatment: Yes Reason for Co-Treatment: For patient/therapist safety;Necessary to address cognition/behavior during functional activity PT goals addressed during session: Mobility/safety with mobility;Balance         AM-PAC PT "6 Clicks" Mobility  Outcome Measure Help needed turning from your back to your side while in a flat bed without using bedrails?: None Help needed moving from lying on your back to sitting on the side of a flat bed without using bedrails?: None Help needed moving to and from a bed to a chair (including a wheelchair)?: A Little Help needed standing up from a chair using your arms (e.g., wheelchair or bedside chair)?: A Little Help needed to walk in hospital room?: A Little Help needed climbing 3-5 steps with a railing? : A Little 6 Click Score: 20    End of Session  Equipment Utilized During Treatment: Gait belt Activity Tolerance: Patient tolerated treatment well Patient left: in bed;with call bell/phone within reach;with bed alarm set Nurse Communication: Mobility status PT Visit Diagnosis: Other abnormalities of gait and mobility (R26.89)    Time: 6503-5465 PT Time Calculation (min) (ACUTE ONLY): 23 min   Charges:   PT Evaluation $PT Eval Moderate Complexity: 1 Mod          Lorrin Goodell, PT  Office # [phone] Pager [phone]   Lorriane Shire 07/06/2020, 10:29 AM

## 2020-07-06 NOTE — Progress Notes (Signed)
Patient BP was 80/32 at 1824, MD was notified and no new orders have been placed at this, RN will recheck blood pressure in 90m per MD request. RN will continue to monitor this patient.

## 2020-07-07 LAB — CBC
HCT: 30.3 % — ABNORMAL LOW (ref 39.0–52.0)
Hemoglobin: 9.6 g/dL — ABNORMAL LOW (ref 13.0–17.0)
MCH: 27.1 pg (ref 26.0–34.0)
MCHC: 31.7 g/dL (ref 30.0–36.0)
MCV: 85.6 fL (ref 80.0–100.0)
Platelets: 42 10*3/uL — ABNORMAL LOW (ref 150–400)
RBC: 3.54 MIL/uL — ABNORMAL LOW (ref 4.22–5.81)
RDW: 15.4 % (ref 11.5–15.5)
WBC: 3.5 10*3/uL — ABNORMAL LOW (ref 4.0–10.5)
nRBC: 0 % (ref 0.0–0.2)

## 2020-07-07 LAB — BASIC METABOLIC PANEL
Anion gap: 13 (ref 5–15)
BUN: 15 mg/dL (ref 8–23)
CO2: 27 mmol/L (ref 22–32)
Calcium: 8.6 mg/dL — ABNORMAL LOW (ref 8.9–10.3)
Chloride: 99 mmol/L (ref 98–111)
Creatinine, Ser: 5.14 mg/dL — ABNORMAL HIGH (ref 0.61–1.24)
GFR, Estimated: 11 mL/min — ABNORMAL LOW (ref >60)
Glucose, Bld: 167 mg/dL — ABNORMAL HIGH (ref 70–99)
Potassium: 3.8 mmol/L (ref 3.5–5.1)
Sodium: 139 mmol/L (ref 135–145)

## 2020-07-07 LAB — AMMONIA: Ammonia: 53 umol/L — ABNORMAL HIGH (ref 9–35)

## 2020-07-07 LAB — GLUCOSE, CAPILLARY
Glucose-Capillary: 140 mg/dL — ABNORMAL HIGH (ref 70–99)
Glucose-Capillary: 238 mg/dL — ABNORMAL HIGH (ref 70–99)

## 2020-07-07 MED ORDER — RIFAXIMIN 550 MG PO TABS
550.0000 mg | ORAL_TABLET | Freq: Two times a day (BID) | ORAL | 1 refills | Status: DC
Start: 2020-07-07 — End: 2023-12-02

## 2020-07-07 NOTE — Discharge Instructions (Signed)
Advised to follow-up with primary care physician in 1 week. Advised to continue rifaximin and lactulose as advised. Advised to hold lactulose until closer towards improved.

## 2020-07-07 NOTE — Progress Notes (Signed)
DISCHARGE NOTE HOME  William [name] to be discharged Home per MD order. Discussed prescriptions and follow up appointments with the patient. Prescriptions given to patient; medication list explained in detail. Patient verbalized understanding.  Skin clean, dry and intact without evidence of skin break down, no evidence of skin tears noted. IV catheter discontinued intact. Site without signs and symptoms of complications. Dressing and pressure applied. Pt denies pain at the site currently. No complaints noted.  Patient free of lines, drains, and wounds.   An After Visit Summary (AVS) was printed and given to the patient and patient's son. Patient escorted via wheelchair, and discharged home via private auto.  Beatris Ship, RN

## 2020-07-07 NOTE — Discharge Summary (Signed)
Physician Discharge Summary  William [name] XBM:[id number] DOB: April 08, 1951 DOA: 07/04/2020  PCP: Horald Pollen, MD  Admit date: 07/04/2020   Discharge date: 07/07/2020  Admitted From: Home. Disposition: Home with home services  Recommendations for Outpatient Follow-up:  1. Follow up with PCP in 1-2 weeks. 2. Please obtain BMP/CBC in one week. 3. Advised to continue rifaximin and lactulose as advised. 4. Advised to hold lactulose until loose stools improved. 5. Continue Hemodialysis as scheduled.  Home Health: PT/OT Equipment/Devices: None  Discharge Condition: Stable CODE STATUS:Full code Diet recommendation: Renal Diet  Brief Summary : This 69 years old male with medical history significant for End-stage renal disease on hemodialysis, End-stage liver disease, liver cirrhosis on transplant list, history of hypertension presented in the emergency department from hemodialysis unit because of altered mental status. Patient only speaks Arabic,  history is obtained from patient's son. Patient ran out of rifaximin and has not taken in last few days.  Patient has been taking lactulose.  He was found very lethargic after hemodialysis.  Patient was admitted for hepatic encephalopathy could be secondary to inadequate medications.  Patient's ammonia level was found to be 93 on arrival.  Hospital course: Patient was admitted for hepatic encephalopathy could be secondary to inadequate medications ( ran out of Rifaximin).  Patient was resumed back on his medications.  Patient 's mental status has improved after having 2 bowel movements.  Patient has fallen while going to the bathroom in the hospital. Denies any head injury or visible injuries.  Nephrology was consulted for continuation of hemodialysis.  Patient underwent hemodialysis.  PT and OT ordered recommended home PT and OT services.  Ammonia level has improved to 53 on discharge.  Patient is alert oriented x 3 , wants to discharge  home. Patient is being discharged home.  He was managed for below problems.  Discharge Diagnoses:  Active Problems:   Metabolic encephalopathy  Acute mental statuschangewith hypotension secondary to metabolic encephalopathy >> Improved -suspect secondary to lack of understanding about medication requirement. -Ammonia elevated at 97-trended down to 49- up to 63-53 -Educated son at bedside the importance of lactulose and rifaximin daily chronic use. -Continue Rifaximin550 mg p.o. twice daily -Continue Lactulose 30 g p.o. 3 times daily  -Patient had two bowel movements.  History of acute GI bleed -CBC is reassuring and H&H is at baseline. -CBC in the a.m.  Hyperlipidemia -Crestor 5 mg p.o. daily   Insulin-dependent diabetes mellitus  Continue home glargine 14 units subcutaneous daily -Insulin SSI -Diet: Carb/heart  End-stage renal disease on hemodialysis Continue hemodialysis as scheduled. Nephrology consulted, he underwent hemodialysis He will continue hemodialysis outpatient as scheduled.  Hypertension in the ED and on admission -Previously on Coreg 12.5 twice daily and this was discontinued at previous admission due to low pressure -Resumed Coreg 12.5 mg twice daily, but blood pressure continues to remain low. - Coreg was discontinued.    Discharge Instructions  Discharge Instructions    Call MD for:  difficulty breathing, headache or visual disturbances   Complete by: As directed    Call MD for:  persistant dizziness or light-headedness   Complete by: As directed    Call MD for:  persistant nausea and vomiting   Complete by: As directed    Diet - low sodium heart healthy   Complete by: As directed    Diet Carb Modified   Complete by: As directed    Discharge instructions   Complete by: As directed    Advised to  follow-up with primary care physician in 1 week. Advised to continue rifaximin and lactulose as advised. Advised to hold lactulose until  closer towards improved.   Increase activity slowly   Complete by: As directed      Allergies as of 07/07/2020      Reactions   Heparin Other (See Comments)   Patient is Muslim and is not permitted Pork derative due to religious belief)   Pork-derived Products Other (See Comments)   Patient does not eat pork due to religious beliefs      Medication List    TAKE these medications   calcium acetate 667 MG capsule Commonly known as: PHOSLO Take 1 capsule (667 mg total) by mouth 3 (three) times daily with meals.   Insulin Pen Needle 31G X 5 MM Misc Commonly known as: Advocate Insulin Pen Needles Use four times daily to inject insulin   insulin regular 100 units/mL injection Commonly known as: NOVOLIN R Inject 6-8 Units into the skin See admin instructions. INJECT 6 UNITS UNDER THE SKIN AT BREAKFAST, 8 UNITS AT LUNCH AND 8 UNITS AT DINNER.   lactulose 10 GM/15ML solution Commonly known as: CHRONULAC Take 45 mLs (30 g total) by mouth 3 (three) times daily. What changed: Another medication with the same name was removed. Continue taking this medication, and follow the directions you see here.   lidocaine-prilocaine cream Commonly known as: EMLA Apply 1 application topically Every Tuesday,Thursday,and Saturday with dialysis.   MIRCERA IJ 1 Dose See admin instructions. Dialysis gives this medication   ondansetron 4 MG disintegrating tablet Commonly known as: Zofran ODT Take 1 tablet (4 mg total) by mouth 2 (two) times daily as needed for nausea or vomiting.   ONE TOUCH LANCETS Misc Use to test blood sugar three times daily   ONE-A-DAY MENS 50+ PO Take 1 tablet by mouth daily.   OneTouch Verio test strip Generic drug: glucose blood USE 1 STRIP TO CHECK GLUCOSE THREE TIMES DAILY. DX:E11.65   pantoprazole 40 MG tablet Commonly known as: PROTONIX Take 1 tablet (40 mg total) by mouth 2 (two) times daily.   rifaximin 550 MG Tabs tablet Commonly known as: XIFAXAN Take 1  tablet (550 mg total) by mouth 2 (two) times daily.   rosuvastatin 5 MG tablet Commonly known as: CRESTOR Take 1 tablet by mouth once daily   Tresiba FlexTouch 100 UNIT/ML FlexTouch Pen Generic drug: insulin degludec Inject 0.14 mLs (14 Units total) into the skin daily. Inject 14 units under the skin once daily.            Durable Medical Equipment  (From admission, onward)         Start     Ordered   07/07/20 1015  DME 3-in-1  Once       Comments: Bedside commode   07/07/20 1015          Follow-up Information    Sagardia, Ines Bloomer, MD Follow up in 1 week(s).   Specialty: Internal Medicine Contact information: [place] [address] [phone]              Allergies  Allergen Reactions  . Heparin Other (See Comments)    Patient is Muslim and is not permitted Pork derative due to religious belief)  . Pork-Derived Products Other (See Comments)    Patient does not eat pork due to religious beliefs    Consultations:  None   Procedures/Studies: CT Head Wo Contrast  Result Date: 07/04/2020 CLINICAL DATA:  Delirium. EXAM: CT HEAD WITHOUT CONTRAST TECHNIQUE: Contiguous axial images were obtained from the base of the skull through the vertex without intravenous contrast. COMPARISON:  Head CT 05/02/2020 FINDINGS: Brain: Mild generalized cerebral atrophy. Mild ill-defined hypoattenuation within the cerebral white matter is nonspecific, but compatible with chronic small vessel ischemic disease. There is no acute intracranial hemorrhage. No demarcated cortical infarct. No extra-axial fluid collection. No evidence of intracranial mass. No midline shift. Vascular: No hyperdense vessel.  Atherosclerotic calcifications. Skull: Normal. Negative for fracture or focal lesion. Sinuses/Orbits: Visualized orbits show no acute finding. Mild mucosal thickening within the imaged right maxillary sinus. Other: Right mastoid effusion. IMPRESSION: No evidence of acute  intracranial abnormality. Mild cerebral atrophy and chronic small vessel ischemic disease, stable as compared to the head CT of 05/02/2020. Mild right maxillary sinus mucosal thickening. Right mastoid effusion. Electronically Signed   By: Kellie Simmering DO   On: 07/04/2020 16:05   DG Chest Portable 1 View  Result Date: 07/04/2020 CLINICAL DATA:  Altered mental status. EXAM: PORTABLE CHEST 1 VIEW COMPARISON:  May 08, 2020. FINDINGS: Stable cardiomegaly with mild central pulmonary vascular congestion. No pneumothorax or pleural effusion is noted. No consolidative process is noted. Bony thorax is unremarkable. IMPRESSION: Stable cardiomegaly with mild central pulmonary vascular congestion. Electronically Signed   By: Marijo Conception M.D.   On: 07/04/2020 14:34      Subjective: Patient was seen and examined at bedside.  Overnight events noted.  Patient blood pressure has improved.   Patient has ambulated in the hallway.  Patient son is at bedside.  Patient reports feeling better and wants to be discharged home.  Discharge Exam: Vitals:   07/07/20 0537 07/07/20 0811  BP: (!) 90/45 (!) 107/50  Pulse: 78 75  Resp: 16 16  Temp: 98.2 F (36.8 C) 98.6 F (37 C)  SpO2: 90% 97%   Vitals:   07/06/20 2135 07/07/20 0200 07/07/20 0537 07/07/20 0811  BP: (!) 99/53 101/65 (!) 90/45 (!) 107/50  Pulse: 87 83 78 75  Resp: 17 18 16 16   Temp: 98.4 F (36.9 C) 98 F (36.7 C) 98.2 F (36.8 C) 98.6 F (37 C)  TempSrc:    Oral  SpO2: 97% 97% 90% 97%  Weight: 59.5 kg     Height:        General: Pt is alert, awake, not in acute distress Cardiovascular: RRR, S1/S2 +, no rubs, no gallops Respiratory: CTA bilaterally, no wheezing, no rhonchi Abdominal: Soft, NT, ND, bowel sounds + Extremities: no edema, no cyanosis    The results of significant diagnostics from this hospitalization (including imaging, microbiology, ancillary and laboratory) are listed below for reference.      Microbiology: Recent Results (from the past 240 hour(s))  Resp Panel by RT-PCR (Flu A&B, Covid) Nasopharyngeal Swab     Status: None   Collection Time: 07/04/20  1:59 PM   Specimen: Nasopharyngeal Swab; Nasopharyngeal(NP) swabs in vial transport medium  Result Value Ref Range Status   SARS Coronavirus 2 by RT PCR NEGATIVE NEGATIVE Final    Comment: (NOTE) SARS-CoV-2 target nucleic acids are NOT DETECTED.  The SARS-CoV-2 RNA is generally detectable in upper respiratory specimens during the acute phase of infection. The lowest concentration of SARS-CoV-2 viral copies this assay can detect is 138 copies/mL. A negative result does not preclude SARS-Cov-2 infection and should not be used as the sole basis for treatment or other patient management decisions. A negative result may occur with  improper specimen  collection/handling, submission of specimen other than nasopharyngeal swab, presence of viral mutation(s) within the areas targeted by this assay, and inadequate number of viral copies(<138 copies/mL). A negative result must be combined with clinical observations, patient history, and epidemiological information. The expected result is Negative.  Fact Sheet for Patients:  EntrepreneurPulse.com.au  Fact Sheet for Healthcare Providers:  IncredibleEmployment.be  This test is no t yet approved or cleared by the [place] FDA and  has been authorized for detection and/or diagnosis of SARS-CoV-2 by FDA under an Emergency Use Authorization (EUA). This EUA will remain  in effect (meaning this test can be used) for the duration of the COVID-19 declaration under Section 564(b)(1) of the Act, 21 U.S.C.section 360bbb-3(b)(1), unless the authorization is terminated  or revoked sooner.       Influenza A by PCR NEGATIVE NEGATIVE Final   Influenza B by PCR NEGATIVE NEGATIVE Final    Comment: (NOTE) The Xpert Xpress SARS-CoV-2/FLU/RSV plus assay is  intended as an aid in the diagnosis of influenza from Nasopharyngeal swab specimens and should not be used as a sole basis for treatment. Nasal washings and aspirates are unacceptable for Xpert Xpress SARS-CoV-2/FLU/RSV testing.  Fact Sheet for Patients: EntrepreneurPulse.com.au  Fact Sheet for Healthcare Providers: IncredibleEmployment.be  This test is not yet approved or cleared by the [place] FDA and has been authorized for detection and/or diagnosis of SARS-CoV-2 by FDA under an Emergency Use Authorization (EUA). This EUA will remain in effect (meaning this test can be used) for the duration of the COVID-19 declaration under Section 564(b)(1) of the Act, 21 U.S.C. section 360bbb-3(b)(1), unless the authorization is terminated or revoked.  Performed at [facility], [place] [address] [place], [place] [address]   C Difficile Quick Screen w PCR reflex     Status: None   Collection Time: 07/05/20  9:45 PM   Specimen: STOOL  Result Value Ref Range Status   C Diff antigen NEGATIVE NEGATIVE Final   C Diff toxin NEGATIVE NEGATIVE Final   C Diff interpretation No C. difficile detected.  Final    Comment: Performed at [facility], [place] [address], [place], [place] [address]     Labs: BNP (last 3 results) No results for input(s): BNP in the last 8760 hours. Basic Metabolic Panel: Recent Labs  Lab 07/04/20 1315 07/05/20 0434 07/06/20 0802 07/07/20 0512  NA 136 141 144 139  K 4.5 3.8 4.3 3.8  CL 95* 99 100 99  CO2 27 26 26 27   GLUCOSE 131* 127* 198* 167*  BUN 15 24* 38* 15  CREATININE 3.65* 5.36* 7.96* 5.14*  CALCIUM 8.4* 9.0 9.9 8.6*  MG 2.3  --  3.0*  --   PHOS  --   --  5.3*  --    Liver Function Tests: Recent Labs  Lab 07/04/20 1315 07/06/20 0802  AST 57* 36  ALT 22 20  ALKPHOS 116 97  BILITOT 1.9* 1.5*  PROT 6.7 6.6  ALBUMIN 2.9* 2.7*   No results for input(s): LIPASE, AMYLASE in the last 168  hours. Recent Labs  Lab 07/04/20 1315 07/05/20 1230 07/06/20 0802 07/07/20 0512  AMMONIA 97* 49* 63* 53*   CBC: Recent Labs  Lab 07/04/20 1639 07/05/20 0434 07/06/20 0802 07/07/20 0512  WBC 3.8* 3.6* 3.7* 3.5*  NEUTROABS 1.8  --   --   --   HGB 10.5* 10.5* 10.4* 9.6*  HCT 33.3* 33.4* 33.1* 30.3*  MCV 85.2 85.2 86.2 85.6  PLT  54* 57* 61* 42*   Cardiac Enzymes: No results for input(s): CKTOTAL, CKMB, CKMBINDEX, TROPONINI in the last 168 hours. BNP: Invalid input(s): POCBNP CBG: Recent Labs  Lab 07/06/20 0651 07/06/20 1127 07/06/20 1839 07/06/20 2136 07/07/20 0653  GLUCAP 142* 342* 140* 243* 140*   D-Dimer No results for input(s): DDIMER in the last 72 hours. Hgb A1c Recent Labs    07/04/20 1639  HGBA1C 6.9*   Lipid Profile No results for input(s): CHOL, HDL, LDLCALC, TRIG, CHOLHDL, LDLDIRECT in the last 72 hours. Thyroid function studies Recent Labs    07/05/20 0434  TSH 0.509   Anemia work up Recent Labs    07/05/20 0434  VITAMINB12 2,158*   Urinalysis    Component Value Date/Time   COLORURINE YELLOW 04/18/2020 0118   APPEARANCEUR HAZY (A) 04/18/2020 0118   LABSPEC 1.027 04/18/2020 0118   PHURINE 5.0 04/18/2020 0118   GLUCOSEU NEGATIVE 04/18/2020 0118   HGBUR NEGATIVE 04/18/2020 0118   BILIRUBINUR NEGATIVE 04/18/2020 0118   BILIRUBINUR neg 09/14/2015 1441   KETONESUR NEGATIVE 04/18/2020 0118   PROTEINUR 30 (A) 04/18/2020 0118   UROBILINOGEN 0.2 09/14/2015 1441   UROBILINOGEN 0.2 01/27/2013 2052   NITRITE NEGATIVE 04/18/2020 0118   LEUKOCYTESUR NEGATIVE 04/18/2020 0118   Sepsis Labs Invalid input(s): PROCALCITONIN,  WBC,  LACTICIDVEN Microbiology Recent Results (from the past 240 hour(s))  Resp Panel by RT-PCR (Flu A&B, Covid) Nasopharyngeal Swab     Status: None   Collection Time: 07/04/20  1:59 PM   Specimen: Nasopharyngeal Swab; Nasopharyngeal(NP) swabs in vial transport medium  Result Value Ref Range Status   SARS Coronavirus 2 by  RT PCR NEGATIVE NEGATIVE Final    Comment: (NOTE) SARS-CoV-2 target nucleic acids are NOT DETECTED.  The SARS-CoV-2 RNA is generally detectable in upper respiratory specimens during the acute phase of infection. The lowest concentration of SARS-CoV-2 viral copies this assay can detect is 138 copies/mL. A negative result does not preclude SARS-Cov-2 infection and should not be used as the sole basis for treatment or other patient management decisions. A negative result may occur with  improper specimen collection/handling, submission of specimen other than nasopharyngeal swab, presence of viral mutation(s) within the areas targeted by this assay, and inadequate number of viral copies(<138 copies/mL). A negative result must be combined with clinical observations, patient history, and epidemiological information. The expected result is Negative.  Fact Sheet for Patients:  EntrepreneurPulse.com.au  Fact Sheet for Healthcare Providers:  IncredibleEmployment.be  This test is no t yet approved or cleared by the [place] FDA and  has been authorized for detection and/or diagnosis of SARS-CoV-2 by FDA under an Emergency Use Authorization (EUA). This EUA will remain  in effect (meaning this test can be used) for the duration of the COVID-19 declaration under Section 564(b)(1) of the Act, 21 U.S.C.section 360bbb-3(b)(1), unless the authorization is terminated  or revoked sooner.       Influenza A by PCR NEGATIVE NEGATIVE Final   Influenza B by PCR NEGATIVE NEGATIVE Final    Comment: (NOTE) The Xpert Xpress SARS-CoV-2/FLU/RSV plus assay is intended as an aid in the diagnosis of influenza from Nasopharyngeal swab specimens and should not be used as a sole basis for treatment. Nasal washings and aspirates are unacceptable for Xpert Xpress SARS-CoV-2/FLU/RSV testing.  Fact Sheet for Patients: EntrepreneurPulse.com.au  Fact Sheet  for Healthcare Providers: IncredibleEmployment.be  This test is not yet approved or cleared by the [place] FDA and has been authorized for detection and/or diagnosis of  SARS-CoV-2 by FDA under an Emergency Use Authorization (EUA). This EUA will remain in effect (meaning this test can be used) for the duration of the COVID-19 declaration under Section 564(b)(1) of the Act, 21 U.S.C. section 360bbb-3(b)(1), unless the authorization is terminated or revoked.  Performed at [facility], [place] [address], [place], [place] [address]   C Difficile Quick Screen w PCR reflex     Status: None   Collection Time: 07/05/20  9:45 PM   Specimen: STOOL  Result Value Ref Range Status   C Diff antigen NEGATIVE NEGATIVE Final   C Diff toxin NEGATIVE NEGATIVE Final   C Diff interpretation No C. difficile detected.  Final    Comment: Performed at [facility], [place] [address], [place], [place] [address]     Time coordinating discharge: Over 30 minutes  SIGNED:   Shawna Clamp, MD  [facility] 07/07/2020, 10:16 AM Pager   If 7PM-7AM, please contact night-coverage [email or web address]

## 2020-07-07 NOTE — Progress Notes (Signed)
Physical Therapy Treatment Patient Details Name: William [name] MRN: [id number] DOB: Apr 16, 1951 Today's Date: 07/07/2020    History of Present Illness 69 y.o. male with medical history significant for end-stage renal disease on hemodialysis, end-stage liver disease, liver cirrhosis, on transplant list, history of hypertension, presented to the emergency department from HD because of altered mental status.    PT Comments    Patient received asleep in bed, son present and acted as interpreter during session. Still fairly unsteady with gait in hallway without assistive device with narrow BOS/scissoring pattern requiring MinA to maintain balance. No reported dizziness with positional changes or when walking in hallway with PT. Responded well to simple cues in arabic along with simple visual cues, did not seem confused today. Son reports he has 24/7A at home and has a personal RW, was educated that patient will definitely benefit from RW and guarding when walking given gait impairments/gross weakness. Left sitting at EOB with son providing direct supervision, RN aware of patient status. OK for DC home when medically ready.     Follow Up Recommendations  Home health PT;Supervision/Assistance - 24 hour     Equipment Recommendations  None recommended by PT    Recommendations for Other Services       Precautions / Restrictions Precautions Precautions: Fall;Other (comment) Precaution Comments: Monitor orthostatics Restrictions Weight Bearing Restrictions: No    Mobility  Bed Mobility Overal bed mobility: Modified Independent Bed Mobility: Supine to Sit;Sit to Supine     Supine to sit: Modified independent (Device/Increase time) Sit to supine: Modified independent (Device/Increase time)   General bed mobility comments: rail, increased time  Transfers Overall transfer level: Needs assistance Equipment used: None Transfers: Sit to/from Stand Sit to Stand: Min guard         General  transfer comment: min guard for safety  Ambulation/Gait Ambulation/Gait assistance: Min assist Gait Distance (Feet): 160 Feet Assistive device: 1 person hand held assist Gait Pattern/deviations: Step-through pattern;Drifts right/left;Narrow base of support;Scissoring Gait velocity: decreased   General Gait Details: still with unsteady gait and scissoring/drifting right and left; MinA to maintain balance. Son reports that he has a walker he usually uses at home and they will be with him 24/7   Stairs             Wheelchair Mobility    Modified Rankin (Stroke Patients Only)       Balance Overall balance assessment: Needs assistance Sitting-balance support: No upper extremity supported;Feet supported Sitting balance-Leahy Scale: Good     Standing balance support: No upper extremity supported;During functional activity Standing balance-Leahy Scale: Poor Standing balance comment: reliant on external support                            Cognition Arousal/Alertness: Awake/alert Behavior During Therapy: WFL for tasks assessed/performed Overall Cognitive Status: No family/caregiver present to determine baseline cognitive functioning                                 General Comments: followed visual cues and simple commands in arabic well today, responded well to son interpreting languages, no signs of confusion during this session      Exercises      General Comments General comments (skin integrity, edema, etc.): VSS, no reported dizziness when walking      Pertinent Vitals/Pain Pain Assessment: No/denies pain    Home Living  Prior Function            PT Goals (current goals can now be found in the care plan section) Acute Rehab PT Goals Patient Stated Goal: not stated PT Goal Formulation: With patient Time For Goal Achievement: 07/20/20 Potential to Achieve Goals: Good Progress towards PT goals:  Progressing toward goals    Frequency    Min 3X/week      PT Plan Current plan remains appropriate    Co-evaluation              AM-PAC PT "6 Clicks" Mobility   Outcome Measure  Help needed turning from your back to your side while in a flat bed without using bedrails?: None Help needed moving from lying on your back to sitting on the side of a flat bed without using bedrails?: None Help needed moving to and from a bed to a chair (including a wheelchair)?: A Little Help needed standing up from a chair using your arms (e.g., wheelchair or bedside chair)?: A Little Help needed to walk in hospital room?: A Little Help needed climbing 3-5 steps with a railing? : A Little 6 Click Score: 20    End of Session   Activity Tolerance: Patient tolerated treatment well Patient left: in bed;with call bell/phone within reach;with family/visitor present Nurse Communication: Mobility status PT Visit Diagnosis: Other abnormalities of gait and mobility (R26.89)     Time: 1005-1020 PT Time Calculation (min) (ACUTE ONLY): 15 min  Charges:  $Gait Training: 8-22 mins                     Windell Norfolk, DPT, PN1   Supplemental Physical Therapist [place]    Pager [phone] Acute Rehab Office [phone]

## 2020-07-08 DIAGNOSIS — N186 End stage renal disease: Secondary | ICD-10-CM | POA: Diagnosis not present

## 2020-07-08 DIAGNOSIS — Z23 Encounter for immunization: Secondary | ICD-10-CM | POA: Diagnosis not present

## 2020-07-08 DIAGNOSIS — D631 Anemia in chronic kidney disease: Secondary | ICD-10-CM | POA: Diagnosis not present

## 2020-07-08 DIAGNOSIS — Z992 Dependence on renal dialysis: Secondary | ICD-10-CM | POA: Diagnosis not present

## 2020-07-08 DIAGNOSIS — N2581 Secondary hyperparathyroidism of renal origin: Secondary | ICD-10-CM | POA: Diagnosis not present

## 2020-07-08 DIAGNOSIS — D509 Iron deficiency anemia, unspecified: Secondary | ICD-10-CM | POA: Diagnosis not present

## 2020-07-12 DIAGNOSIS — Z992 Dependence on renal dialysis: Secondary | ICD-10-CM | POA: Diagnosis not present

## 2020-07-12 DIAGNOSIS — K7460 Unspecified cirrhosis of liver: Secondary | ICD-10-CM | POA: Diagnosis not present

## 2020-07-12 DIAGNOSIS — D696 Thrombocytopenia, unspecified: Secondary | ICD-10-CM | POA: Diagnosis not present

## 2020-07-12 DIAGNOSIS — I509 Heart failure, unspecified: Secondary | ICD-10-CM | POA: Diagnosis not present

## 2020-07-12 DIAGNOSIS — N186 End stage renal disease: Secondary | ICD-10-CM | POA: Diagnosis not present

## 2020-07-12 DIAGNOSIS — K766 Portal hypertension: Secondary | ICD-10-CM | POA: Diagnosis not present

## 2020-07-12 DIAGNOSIS — R188 Other ascites: Secondary | ICD-10-CM | POA: Diagnosis not present

## 2020-07-12 DIAGNOSIS — I81 Portal vein thrombosis: Secondary | ICD-10-CM | POA: Diagnosis not present

## 2020-07-12 DIAGNOSIS — E1122 Type 2 diabetes mellitus with diabetic chronic kidney disease: Secondary | ICD-10-CM | POA: Diagnosis not present

## 2020-07-12 DIAGNOSIS — I132 Hypertensive heart and chronic kidney disease with heart failure and with stage 5 chronic kidney disease, or end stage renal disease: Secondary | ICD-10-CM | POA: Diagnosis not present

## 2020-07-12 DIAGNOSIS — Z7682 Awaiting organ transplant status: Secondary | ICD-10-CM | POA: Diagnosis not present

## 2020-07-26 DIAGNOSIS — Z7901 Long term (current) use of anticoagulants: Secondary | ICD-10-CM | POA: Diagnosis not present

## 2020-08-05 DIAGNOSIS — Z992 Dependence on renal dialysis: Secondary | ICD-10-CM | POA: Diagnosis not present

## 2020-08-05 DIAGNOSIS — N186 End stage renal disease: Secondary | ICD-10-CM | POA: Diagnosis not present

## 2020-08-05 DIAGNOSIS — E1122 Type 2 diabetes mellitus with diabetic chronic kidney disease: Secondary | ICD-10-CM | POA: Diagnosis not present

## 2020-08-06 DIAGNOSIS — N186 End stage renal disease: Secondary | ICD-10-CM | POA: Diagnosis not present

## 2020-08-06 DIAGNOSIS — D631 Anemia in chronic kidney disease: Secondary | ICD-10-CM | POA: Diagnosis not present

## 2020-08-06 DIAGNOSIS — N2581 Secondary hyperparathyroidism of renal origin: Secondary | ICD-10-CM | POA: Diagnosis not present

## 2020-08-06 DIAGNOSIS — Z992 Dependence on renal dialysis: Secondary | ICD-10-CM | POA: Diagnosis not present

## 2020-08-06 DIAGNOSIS — D509 Iron deficiency anemia, unspecified: Secondary | ICD-10-CM | POA: Diagnosis not present

## 2020-08-18 ENCOUNTER — Ambulatory Visit: Payer: Medicare Other | Admitting: Vascular Surgery

## 2020-08-24 DIAGNOSIS — E1129 Type 2 diabetes mellitus with other diabetic kidney complication: Secondary | ICD-10-CM | POA: Diagnosis not present

## 2020-08-28 DIAGNOSIS — H938X1 Other specified disorders of right ear: Secondary | ICD-10-CM | POA: Diagnosis not present

## 2020-08-28 DIAGNOSIS — H7291 Unspecified perforation of tympanic membrane, right ear: Secondary | ICD-10-CM | POA: Insufficient documentation

## 2020-08-28 DIAGNOSIS — Z7901 Long term (current) use of anticoagulants: Secondary | ICD-10-CM | POA: Diagnosis not present

## 2020-09-04 ENCOUNTER — Telehealth: Payer: Self-pay | Admitting: Endocrinology

## 2020-09-04 ENCOUNTER — Other Ambulatory Visit: Payer: Self-pay | Admitting: *Deleted

## 2020-09-04 MED ORDER — TRESIBA FLEXTOUCH 100 UNIT/ML ~~LOC~~ SOPN
14.0000 [IU] | PEN_INJECTOR | Freq: Every day | SUBCUTANEOUS | 0 refills | Status: DC
Start: 2020-09-04 — End: 2021-05-30

## 2020-09-04 NOTE — Telephone Encounter (Signed)
Pt requests a refill for his William [name] 100 UNIT/ML. Pt has made an upcoming appt for 10/18/20.   PHARMACY: [place], [place]. Phone:  [phone]  Fax:  [phone]

## 2020-09-05 DIAGNOSIS — E1122 Type 2 diabetes mellitus with diabetic chronic kidney disease: Secondary | ICD-10-CM | POA: Diagnosis not present

## 2020-09-05 DIAGNOSIS — D509 Iron deficiency anemia, unspecified: Secondary | ICD-10-CM | POA: Diagnosis not present

## 2020-09-05 DIAGNOSIS — D631 Anemia in chronic kidney disease: Secondary | ICD-10-CM | POA: Diagnosis not present

## 2020-09-05 DIAGNOSIS — N2581 Secondary hyperparathyroidism of renal origin: Secondary | ICD-10-CM | POA: Diagnosis not present

## 2020-09-05 DIAGNOSIS — N186 End stage renal disease: Secondary | ICD-10-CM | POA: Diagnosis not present

## 2020-09-05 DIAGNOSIS — Z992 Dependence on renal dialysis: Secondary | ICD-10-CM | POA: Diagnosis not present

## 2020-09-05 NOTE — Telephone Encounter (Signed)
Rx was sent on 09/04/2020

## 2020-09-08 ENCOUNTER — Ambulatory Visit: Payer: Medicare Other | Admitting: Vascular Surgery

## 2020-09-22 ENCOUNTER — Encounter: Payer: Self-pay | Admitting: Vascular Surgery

## 2020-09-22 ENCOUNTER — Other Ambulatory Visit: Payer: Self-pay

## 2020-09-22 ENCOUNTER — Ambulatory Visit (INDEPENDENT_AMBULATORY_CARE_PROVIDER_SITE_OTHER): Payer: Medicare Other | Admitting: Vascular Surgery

## 2020-09-22 VITALS — BP 177/75 | HR 92 | Temp 98.0°F | Resp 20 | Ht 65.0 in | Wt 143.0 lb

## 2020-09-22 DIAGNOSIS — N186 End stage renal disease: Secondary | ICD-10-CM

## 2020-09-22 NOTE — Progress Notes (Signed)
Patient ID: William [name], male   DOB: 01/14/1951, 70 y.o.   MRN: [id number]  Reason for Consult: Follow-up   Referred by Horald Pollen, *  Subjective:     HPI:  William [name] is a 70 y.o. male history of a two-stage basilic vein fistula on the left.  Recently he has thinning skin of this fistula.  He has undergone percutaneous thrombectomy of this fistula on at least one previous occasion.  He is now here for evaluation of thinning skin also has had some difficulty with dialysis through his fistula.  He does not have a catheter.  He is not on blood thinners.  Currently dialyzes Tuesdays Thursdays and Saturdays.  Patient speaks Arabic being from the [place] all information was obtained from his son.  Past Medical History:  Diagnosis Date  . Anemia   . Blind one eye   . CHF (congestive heart failure) ([place])   . Cirrhosis ([place])   . ESRD on hemodialysis ([place])   . Goodpasture's disease [facility])    on outpatient plasmapheresis/notes 11/20/2017  . Grade I diastolic dysfunction 53/66/4403  . History of anemia due to chronic kidney disease   . History of blood transfusion X 1   "UGIB; low blood count"  . History of plasmapheresis    "qod" (11/20/2017)  . Hypertension   . Pancytopenia ([place]) 08/20/2017  . Type II diabetes mellitus (HCC)    Family History  Problem Relation Age of Onset  . Diabetes Mellitus II Sister   . Diabetes Sister   . Stroke Brother   . Heart attack Brother    Past Surgical History:  Procedure Laterality Date  . AV FISTULA PLACEMENT Left 12/26/2017   Procedure: LEFT BRACHIOCEPHALIC ARTERIOVENOUS (AV) FISTULA CREATION;  Surgeon: Conrad Subiaco, MD;  Location: [place];  Service: Vascular;  Laterality: Left;  . BASCILIC VEIN TRANSPOSITION Left 02/16/2018   Procedure: BRACHIOBASILIC VEIN TRANSPOSITION SECOND STAGE;  Surgeon: Conrad Lacy-Lakeview, MD;  Location: [place];  Service: Vascular;  Laterality: Left;  . CATARACT EXTRACTION W/ INTRAOCULAR LENS  IMPLANT, BILATERAL  Bilateral   . ESOPHAGEAL BANDING N/A 04/01/2018   Procedure: ESOPHAGEAL BANDING;  Surgeon: Otis Brace, MD;  [facility];  Service: Gastroenterology;  Laterality: N/A;  . ESOPHAGEAL BANDING N/A 06/29/2018   Procedure: ESOPHAGEAL BANDING;  Surgeon: Otis Brace, MD;  [facility];  Service: Gastroenterology;  Laterality: N/A;  . ESOPHAGOGASTRODUODENOSCOPY (EGD) WITH PROPOFOL N/A 04/01/2018   Procedure: ESOPHAGOGASTRODUODENOSCOPY (EGD) WITH PROPOFOL;  Surgeon: Otis Brace, MD;  [facility];  Service: Gastroenterology;  Laterality: N/A;  . ESOPHAGOGASTRODUODENOSCOPY (EGD) WITH PROPOFOL N/A 06/29/2018   Procedure: ESOPHAGOGASTRODUODENOSCOPY (EGD) WITH PROPOFOL;  Surgeon: Otis Brace, MD;  [facility];  Service: Gastroenterology;  Laterality: N/A;  . ESOPHAGOGASTRODUODENOSCOPY (EGD) WITH PROPOFOL N/A 09/30/2018   Procedure: ESOPHAGOGASTRODUODENOSCOPY (EGD) WITH PROPOFOL;  Surgeon: Otis Brace, MD;  [facility];  Service: Gastroenterology;  Laterality: N/A;  . ESOPHAGOGASTRODUODENOSCOPY (EGD) WITH PROPOFOL N/A 04/20/2019   Procedure: ESOPHAGOGASTRODUODENOSCOPY (EGD) WITH PROPOFOL;  Surgeon: Otis Brace, MD;  [facility];  Service: Gastroenterology;  Laterality: N/A;  . ESOPHAGOGASTRODUODENOSCOPY (EGD) WITH PROPOFOL N/A 04/17/2020   Procedure: ESOPHAGOGASTRODUODENOSCOPY (EGD) WITH PROPOFOL;  Surgeon: Wonda Horner, MD;  Location: [place];  Service: Gastroenterology;  Laterality: N/A;  . I & D EXTREMITY Left 02/18/2018   Procedure: IRRIGATION AND DEBRIDEMENT LEFT ARM;  Surgeon: Serafina Mitchell, MD;  [facility];  Service: Vascular;  Laterality: Left;  . INSERTION OF DIALYSIS  CATHETER N/A 01/19/2018   Procedure: INSERTION OF DIALYSIS CATHETER - RIGHT INTERNAL JUGULAR PLACEMENT;  Surgeon: Rosetta Posner, MD;  Location: [place];  Service: Vascular;  Laterality: N/A;  . IR FLUORO GUIDE CV LINE RIGHT  11/10/2017   . IR PARACENTESIS  12/10/2017  . IR PARACENTESIS  12/26/2017  . IR REMOVAL TUN CV CATH W/O FL  11/28/2017  . IR THROMBECTOMY AV FISTULA W/THROMBOLYSIS/PTA INC/SHUNT/IMG LEFT Left 06/02/2019  . IR US GUIDE VASC ACCESS LEFT  06/02/2019  . IR US GUIDE VASC ACCESS RIGHT  11/10/2017  . NECK SURGERY  2007   "back of neck; no hardware in there"    Short Social History:  Social History   Tobacco Use  . Smoking status: Never Smoker  . Smokeless tobacco: Never Used  Substance Use Topics  . Alcohol use: Never    Allergies  Allergen Reactions  . Heparin Other (See Comments)    Patient is Muslim and is not permitted Pork derative due to religious belief)  . Pork-Derived Products Other (See Comments)    Patient does not eat pork due to religious beliefs    Current Outpatient Medications  Medication Sig Dispense Refill  . calcium acetate (PHOSLO) 667 MG capsule Take 1 capsule (667 mg total) by mouth 3 (three) times daily with meals.    Marland Kitchen glucose blood (ONETOUCH VERIO) test strip USE 1 STRIP TO CHECK GLUCOSE THREE TIMES DAILY. DX:E11.65 150 each 0  . insulin degludec (TRESIBA FLEXTOUCH) 100 UNIT/ML FlexTouch Pen Inject 14 Units into the skin daily. Inject 14 units under the skin once daily. NO FURTHER REFILLS UNTIL PATIENT IS SEEN IN THE OFFICE 2 mL 0  . Insulin Pen Needle (ADVOCATE INSULIN PEN NEEDLES) 31G X 5 MM MISC Use four times daily to inject insulin 150 each 1  . insulin regular (NOVOLIN R,HUMULIN R) 100 units/mL injection Inject 6-8 Units into the skin See admin instructions. INJECT 6 UNITS UNDER THE SKIN AT BREAKFAST, 8 UNITS AT LUNCH AND 8 UNITS AT DINNER.    Marland Kitchen lactulose (CHRONULAC) 10 GM/15ML solution Take 45 mLs (30 g total) by mouth 3 (three) times daily. (Patient not taking: Reported on 07/04/2020) 946 mL 1  . lidocaine-prilocaine (EMLA) cream Apply 1 application topically Every Tuesday,Thursday,and Saturday with dialysis.    . Methoxy PEG-Epoetin Beta (MIRCERA IJ) 1 Dose See admin  instructions. Dialysis gives this medication    . Multiple Vitamins-Minerals (ONE-A-DAY MENS 50+ PO) Take 1 tablet by mouth daily.    . ondansetron (ZOFRAN ODT) 4 MG disintegrating tablet Take 1 tablet (4 mg total) by mouth 2 (two) times daily as needed for nausea or vomiting. 20 tablet 0  . ONE TOUCH LANCETS MISC Use to test blood sugar three times daily 100 each 2  . pantoprazole (PROTONIX) 40 MG tablet Take 1 tablet (40 mg total) by mouth 2 (two) times daily. 30 tablet 1  . rifaximin (XIFAXAN) 550 MG TABS tablet Take 1 tablet (550 mg total) by mouth 2 (two) times daily. 60 tablet 1  . rosuvastatin (CRESTOR) 5 MG tablet Take 1 tablet by mouth once daily (Patient taking differently: Take 5 mg by mouth daily. ) 90 tablet 0   No current facility-administered medications for this visit.    Review of Systems  Constitutional:  Constitutional negative. HENT: HENT negative.  Eyes: Eyes negative.  Respiratory: Respiratory negative.  Cardiovascular: Cardiovascular negative.  GI: Gastrointestinal negative.  Musculoskeletal: Musculoskeletal negative.  Skin: Skin negative.  Neurological: Neurological negative. Hematologic: Hematologic/lymphatic  negative.  Psychiatric: Psychiatric negative.        Objective:  Objective   Vitals:   09/22/20 1441  BP: (!) 177/75  Pulse: 92  Resp: 20  Temp: 98 F (36.7 C)  SpO2: 94%    Physical Exam HENT:     Nose:     Comments: Wearing a mask Cardiovascular:     Pulses:          Popliteal pulses are 2+ on the right side and 2+ on the left side.       Posterior tibial pulses are 2+ on the right side and 2+ on the left side.  Pulmonary:     Effort: Pulmonary effort is normal.  Abdominal:     General: Abdomen is flat.     Palpations: Abdomen is soft.  Musculoskeletal:     Comments: Fistula has pulsatility in the left upper arm  Skin:    Capillary Refill: Capillary refill takes less than 2 seconds.     Comments: There appears to be chronic  ulceration of his right third toe there is a bandage on his left fourth toe  Neurological:     General: No focal deficit present.     Mental Status: He is alert.  Psychiatric:        Mood and Affect: Mood normal.        Thought Content: Thought content normal.        Judgment: Judgment normal.        Assessment/Plan:     71 year old male here for malfunction of left arm fistula.  Does appear pulsatile on physical exam.  We will begin with fistulogram possible intervention.  I discussed the risk benefits alternatives with the patient and his son they demonstrate good understanding we will get him scheduled on a nondialysis day in the near future.     Waynetta Sandy MD [facility]

## 2020-09-22 NOTE — H&P (View-Only) (Signed)
Patient ID: William [name], male   DOB: [birth year], 70 y.o.   MRN: [id number]  Reason for Consult: Follow-up   Referred by Horald Pollen, *  Subjective:     HPI:  William [name] is a 70 y.o. male history of a two-stage basilic vein fistula on the left.  Recently he has thinning skin of this fistula.  He has undergone percutaneous thrombectomy of this fistula on at least one previous occasion.  He is now here for evaluation of thinning skin also has had some difficulty with dialysis through his fistula.  He does not have a catheter.  He is not on blood thinners.  Currently dialyzes Tuesdays Thursdays and Saturdays.  Patient speaks Arabic being from the [place] all information was obtained from his son.  Past Medical History:  Diagnosis Date  . Anemia   . Blind one eye   . CHF (congestive heart failure) ([place])   . Cirrhosis ([place])   . ESRD on hemodialysis ([place])   . Goodpasture's disease [facility])    on outpatient plasmapheresis/notes 11/20/2017  . Grade I diastolic dysfunction 95/04/3266  . History of anemia due to chronic kidney disease   . History of blood transfusion X 1   "UGIB; low blood count"  . History of plasmapheresis    "qod" (11/20/2017)  . Hypertension   . Pancytopenia ([place]) 08/20/2017  . Type II diabetes mellitus (HCC)    Family History  Problem Relation Age of Onset  . Diabetes Mellitus II Sister   . Diabetes Sister   . Stroke Brother   . Heart attack Brother    Past Surgical History:  Procedure Laterality Date  . AV FISTULA PLACEMENT Left 12/26/2017   Procedure: LEFT BRACHIOCEPHALIC ARTERIOVENOUS (AV) FISTULA CREATION;  Surgeon: Conrad Firth, MD;  Location: [place];  Service: Vascular;  Laterality: Left;  . BASCILIC VEIN TRANSPOSITION Left 02/16/2018   Procedure: BRACHIOBASILIC VEIN TRANSPOSITION SECOND STAGE;  Surgeon: Conrad Blades, MD;  Location: [place];  Service: Vascular;  Laterality: Left;  . CATARACT EXTRACTION W/ INTRAOCULAR LENS  IMPLANT, BILATERAL  Bilateral   . ESOPHAGEAL BANDING N/A 04/01/2018   Procedure: ESOPHAGEAL BANDING;  Surgeon: Otis Brace, MD;  [facility];  Service: Gastroenterology;  Laterality: N/A;  . ESOPHAGEAL BANDING N/A 06/29/2018   Procedure: ESOPHAGEAL BANDING;  Surgeon: Otis Brace, MD;  [facility];  Service: Gastroenterology;  Laterality: N/A;  . ESOPHAGOGASTRODUODENOSCOPY (EGD) WITH PROPOFOL N/A 04/01/2018   Procedure: ESOPHAGOGASTRODUODENOSCOPY (EGD) WITH PROPOFOL;  Surgeon: Otis Brace, MD;  [facility];  Service: Gastroenterology;  Laterality: N/A;  . ESOPHAGOGASTRODUODENOSCOPY (EGD) WITH PROPOFOL N/A 06/29/2018   Procedure: ESOPHAGOGASTRODUODENOSCOPY (EGD) WITH PROPOFOL;  Surgeon: Otis Brace, MD;  [facility];  Service: Gastroenterology;  Laterality: N/A;  . ESOPHAGOGASTRODUODENOSCOPY (EGD) WITH PROPOFOL N/A 09/30/2018   Procedure: ESOPHAGOGASTRODUODENOSCOPY (EGD) WITH PROPOFOL;  Surgeon: Otis Brace, MD;  [facility];  Service: Gastroenterology;  Laterality: N/A;  . ESOPHAGOGASTRODUODENOSCOPY (EGD) WITH PROPOFOL N/A 04/20/2019   Procedure: ESOPHAGOGASTRODUODENOSCOPY (EGD) WITH PROPOFOL;  Surgeon: Otis Brace, MD;  [facility];  Service: Gastroenterology;  Laterality: N/A;  . ESOPHAGOGASTRODUODENOSCOPY (EGD) WITH PROPOFOL N/A 04/17/2020   Procedure: ESOPHAGOGASTRODUODENOSCOPY (EGD) WITH PROPOFOL;  Surgeon: Wonda Horner, MD;  Location: [place];  Service: Gastroenterology;  Laterality: N/A;  . I & D EXTREMITY Left 02/18/2018   Procedure: IRRIGATION AND DEBRIDEMENT LEFT ARM;  Surgeon: Serafina Mitchell, MD;  [facility];  Service: Vascular;  Laterality: Left;  . INSERTION OF DIALYSIS  CATHETER N/A 01/19/2018   Procedure: INSERTION OF DIALYSIS CATHETER - RIGHT INTERNAL JUGULAR PLACEMENT;  Surgeon: Rosetta Posner, MD;  Location: [place];  Service: Vascular;  Laterality: N/A;  . IR FLUORO GUIDE CV LINE RIGHT  11/10/2017   . IR PARACENTESIS  12/10/2017  . IR PARACENTESIS  12/26/2017  . IR REMOVAL TUN CV CATH W/O FL  11/28/2017  . IR THROMBECTOMY AV FISTULA W/THROMBOLYSIS/PTA INC/SHUNT/IMG LEFT Left 06/02/2019  . IR US GUIDE VASC ACCESS LEFT  06/02/2019  . IR US GUIDE VASC ACCESS RIGHT  11/10/2017  . NECK SURGERY  2007   "back of neck; no hardware in there"    Short Social History:  Social History   Tobacco Use  . Smoking status: Never Smoker  . Smokeless tobacco: Never Used  Substance Use Topics  . Alcohol use: Never    Allergies  Allergen Reactions  . Heparin Other (See Comments)    Patient is Muslim and is not permitted Pork derative due to religious belief)  . Pork-Derived Products Other (See Comments)    Patient does not eat pork due to religious beliefs    Current Outpatient Medications  Medication Sig Dispense Refill  . calcium acetate (PHOSLO) 667 MG capsule Take 1 capsule (667 mg total) by mouth 3 (three) times daily with meals.    Marland Kitchen glucose blood (ONETOUCH VERIO) test strip USE 1 STRIP TO CHECK GLUCOSE THREE TIMES DAILY. DX:E11.65 150 each 0  . insulin degludec (TRESIBA FLEXTOUCH) 100 UNIT/ML FlexTouch Pen Inject 14 Units into the skin daily. Inject 14 units under the skin once daily. NO FURTHER REFILLS UNTIL PATIENT IS SEEN IN THE OFFICE 2 mL 0  . Insulin Pen Needle (ADVOCATE INSULIN PEN NEEDLES) 31G X 5 MM MISC Use four times daily to inject insulin 150 each 1  . insulin regular (NOVOLIN R,HUMULIN R) 100 units/mL injection Inject 6-8 Units into the skin See admin instructions. INJECT 6 UNITS UNDER THE SKIN AT BREAKFAST, 8 UNITS AT LUNCH AND 8 UNITS AT DINNER.    Marland Kitchen lactulose (CHRONULAC) 10 GM/15ML solution Take 45 mLs (30 g total) by mouth 3 (three) times daily. (Patient not taking: Reported on 07/04/2020) 946 mL 1  . lidocaine-prilocaine (EMLA) cream Apply 1 application topically Every Tuesday,Thursday,and Saturday with dialysis.    . Methoxy PEG-Epoetin Beta (MIRCERA IJ) 1 Dose See admin  instructions. Dialysis gives this medication    . Multiple Vitamins-Minerals (ONE-A-DAY MENS 50+ PO) Take 1 tablet by mouth daily.    . ondansetron (ZOFRAN ODT) 4 MG disintegrating tablet Take 1 tablet (4 mg total) by mouth 2 (two) times daily as needed for nausea or vomiting. 20 tablet 0  . ONE TOUCH LANCETS MISC Use to test blood sugar three times daily 100 each 2  . pantoprazole (PROTONIX) 40 MG tablet Take 1 tablet (40 mg total) by mouth 2 (two) times daily. 30 tablet 1  . rifaximin (XIFAXAN) 550 MG TABS tablet Take 1 tablet (550 mg total) by mouth 2 (two) times daily. 60 tablet 1  . rosuvastatin (CRESTOR) 5 MG tablet Take 1 tablet by mouth once daily (Patient taking differently: Take 5 mg by mouth daily. ) 90 tablet 0   No current facility-administered medications for this visit.    Review of Systems  Constitutional:  Constitutional negative. HENT: HENT negative.  Eyes: Eyes negative.  Respiratory: Respiratory negative.  Cardiovascular: Cardiovascular negative.  GI: Gastrointestinal negative.  Musculoskeletal: Musculoskeletal negative.  Skin: Skin negative.  Neurological: Neurological negative. Hematologic: Hematologic/lymphatic  negative.  Psychiatric: Psychiatric negative.        Objective:  Objective   Vitals:   09/22/20 1441  BP: (!) 177/75  Pulse: 92  Resp: 20  Temp: 98 F (36.7 C)  SpO2: 94%    Physical Exam HENT:     Nose:     Comments: Wearing a mask Cardiovascular:     Pulses:          Popliteal pulses are 2+ on the right side and 2+ on the left side.       Posterior tibial pulses are 2+ on the right side and 2+ on the left side.  Pulmonary:     Effort: Pulmonary effort is normal.  Abdominal:     General: Abdomen is flat.     Palpations: Abdomen is soft.  Musculoskeletal:     Comments: Fistula has pulsatility in the left upper arm  Skin:    Capillary Refill: Capillary refill takes less than 2 seconds.     Comments: There appears to be chronic  ulceration of his right third toe there is a bandage on his left fourth toe  Neurological:     General: No focal deficit present.     Mental Status: He is alert.  Psychiatric:        Mood and Affect: Mood normal.        Thought Content: Thought content normal.        Judgment: Judgment normal.        Assessment/Plan:     70 year old male here for malfunction of left arm fistula.  Does appear pulsatile on physical exam.  We will begin with fistulogram possible intervention.  I discussed the risk benefits alternatives with the patient and his son they demonstrate good understanding we will get him scheduled on a nondialysis day in the near future.     Waynetta Sandy MD [facility]

## 2020-09-26 ENCOUNTER — Other Ambulatory Visit: Payer: Self-pay

## 2020-09-27 ENCOUNTER — Ambulatory Visit (INDEPENDENT_AMBULATORY_CARE_PROVIDER_SITE_OTHER): Payer: Medicare Other | Admitting: Podiatry

## 2020-09-27 ENCOUNTER — Other Ambulatory Visit: Payer: Self-pay

## 2020-09-27 ENCOUNTER — Encounter: Payer: Self-pay | Admitting: Podiatry

## 2020-09-27 DIAGNOSIS — L97511 Non-pressure chronic ulcer of other part of right foot limited to breakdown of skin: Secondary | ICD-10-CM | POA: Diagnosis not present

## 2020-09-27 DIAGNOSIS — E1149 Type 2 diabetes mellitus with other diabetic neurological complication: Secondary | ICD-10-CM

## 2020-09-27 DIAGNOSIS — Z7901 Long term (current) use of anticoagulants: Secondary | ICD-10-CM | POA: Diagnosis not present

## 2020-09-27 MED ORDER — DOXYCYCLINE HYCLATE 100 MG PO TABS: 100.0000 mg | ORAL_TABLET | Freq: Two times a day (BID) | ORAL | 0 refills | Status: DC

## 2020-09-27 NOTE — Progress Notes (Signed)
Subjective:   Patient ID: William [name], male   DOB: 70 y.o.   MRN: [id number]   HPI Patient presents with caregiver stating that he clipped his toenails and it started to bleed profusely and admits he should not have done that on his   ROS      Objective:  Physical Exam  Neurovascular status intact with a mild degloving of the third digit distal left localized no subcutaneous exposure with minimal warmth in the foot and no proximal edema or calor noted.  No systemic signs of infection no increased temperature or any other indications of systemic infection     Assessment:  Traumatized digit left secondary to poor care for himself     Plan:  H&P reviewed the importance of routine care to be seen by [place] and that this can occur when he tries to take care of himself.  Patient's caregiver understands this and today I went ahead and I flushed the area I applied Silvadene dressing I placed on doxycycline twice daily and I instructed on soaks and open toed shoes.  Reappoint if any symptoms were to occur

## 2020-10-03 DIAGNOSIS — D631 Anemia in chronic kidney disease: Secondary | ICD-10-CM | POA: Diagnosis not present

## 2020-10-03 DIAGNOSIS — E1122 Type 2 diabetes mellitus with diabetic chronic kidney disease: Secondary | ICD-10-CM | POA: Diagnosis not present

## 2020-10-03 DIAGNOSIS — N186 End stage renal disease: Secondary | ICD-10-CM | POA: Diagnosis not present

## 2020-10-03 DIAGNOSIS — N2581 Secondary hyperparathyroidism of renal origin: Secondary | ICD-10-CM | POA: Diagnosis not present

## 2020-10-03 DIAGNOSIS — D509 Iron deficiency anemia, unspecified: Secondary | ICD-10-CM | POA: Diagnosis not present

## 2020-10-03 DIAGNOSIS — Z992 Dependence on renal dialysis: Secondary | ICD-10-CM | POA: Diagnosis not present

## 2020-10-06 ENCOUNTER — Inpatient Hospital Stay (HOSPITAL_COMMUNITY): Payer: Medicare Other

## 2020-10-06 ENCOUNTER — Other Ambulatory Visit: Payer: Self-pay

## 2020-10-06 ENCOUNTER — Inpatient Hospital Stay (HOSPITAL_COMMUNITY)
Admission: EM | Admit: 2020-10-06 | Discharge: 2020-10-07 | DRG: 255 | Disposition: A | Discharge status: Home / Self Care | Payer: Medicare Other | Attending: Student in an Organized Health Care Education/Training Program | Admitting: Student in an Organized Health Care Education/Training Program

## 2020-10-06 ENCOUNTER — Emergency Department (HOSPITAL_COMMUNITY): Payer: Medicare Other

## 2020-10-06 ENCOUNTER — Encounter (HOSPITAL_COMMUNITY): Payer: Self-pay | Admitting: Emergency Medicine

## 2020-10-06 ENCOUNTER — Telehealth: Payer: Self-pay | Admitting: Podiatry

## 2020-10-06 DIAGNOSIS — E11621 Type 2 diabetes mellitus with foot ulcer: Secondary | ICD-10-CM | POA: Diagnosis not present

## 2020-10-06 DIAGNOSIS — Z9841 Cataract extraction status, right eye: Secondary | ICD-10-CM

## 2020-10-06 DIAGNOSIS — E11628 Type 2 diabetes mellitus with other skin complications: Secondary | ICD-10-CM | POA: Diagnosis not present

## 2020-10-06 DIAGNOSIS — M310 Hypersensitivity angiitis: Secondary | ICD-10-CM | POA: Diagnosis present

## 2020-10-06 DIAGNOSIS — Z603 Acculturation difficulty: Secondary | ICD-10-CM | POA: Diagnosis present

## 2020-10-06 DIAGNOSIS — I96 Gangrene, not elsewhere classified: Secondary | ICD-10-CM

## 2020-10-06 DIAGNOSIS — Z833 Family history of diabetes mellitus: Secondary | ICD-10-CM

## 2020-10-06 DIAGNOSIS — Z91014 Allergy to mammalian meats: Secondary | ICD-10-CM

## 2020-10-06 DIAGNOSIS — R188 Other ascites: Secondary | ICD-10-CM | POA: Diagnosis not present

## 2020-10-06 DIAGNOSIS — E1152 Type 2 diabetes mellitus with diabetic peripheral angiopathy with gangrene: Secondary | ICD-10-CM | POA: Diagnosis not present

## 2020-10-06 DIAGNOSIS — E1122 Type 2 diabetes mellitus with diabetic chronic kidney disease: Secondary | ICD-10-CM | POA: Diagnosis present

## 2020-10-06 DIAGNOSIS — H5440 Blindness, one eye, unspecified eye: Secondary | ICD-10-CM | POA: Diagnosis not present

## 2020-10-06 DIAGNOSIS — Z823 Family history of stroke: Secondary | ICD-10-CM

## 2020-10-06 DIAGNOSIS — I132 Hypertensive heart and chronic kidney disease with heart failure and with stage 5 chronic kidney disease, or end stage renal disease: Secondary | ICD-10-CM | POA: Diagnosis present

## 2020-10-06 DIAGNOSIS — S98912A Complete traumatic amputation of left foot, level unspecified, initial encounter: Secondary | ICD-10-CM | POA: Diagnosis not present

## 2020-10-06 DIAGNOSIS — D631 Anemia in chronic kidney disease: Secondary | ICD-10-CM | POA: Diagnosis not present

## 2020-10-06 DIAGNOSIS — Z20822 Contact with and (suspected) exposure to covid-19: Secondary | ICD-10-CM | POA: Diagnosis present

## 2020-10-06 DIAGNOSIS — K7460 Unspecified cirrhosis of liver: Secondary | ICD-10-CM | POA: Diagnosis present

## 2020-10-06 DIAGNOSIS — Z888 Allergy status to other drugs, medicaments and biological substances status: Secondary | ICD-10-CM | POA: Diagnosis not present

## 2020-10-06 DIAGNOSIS — L089 Local infection of the skin and subcutaneous tissue, unspecified: Secondary | ICD-10-CM | POA: Diagnosis not present

## 2020-10-06 DIAGNOSIS — I70262 Atherosclerosis of native arteries of extremities with gangrene, left leg: Secondary | ICD-10-CM | POA: Diagnosis not present

## 2020-10-06 DIAGNOSIS — I5032 Chronic diastolic (congestive) heart failure: Secondary | ICD-10-CM | POA: Diagnosis not present

## 2020-10-06 DIAGNOSIS — L97529 Non-pressure chronic ulcer of other part of left foot with unspecified severity: Secondary | ICD-10-CM | POA: Diagnosis present

## 2020-10-06 DIAGNOSIS — I10 Essential (primary) hypertension: Secondary | ICD-10-CM | POA: Diagnosis present

## 2020-10-06 DIAGNOSIS — D61818 Other pancytopenia: Secondary | ICD-10-CM | POA: Diagnosis present

## 2020-10-06 DIAGNOSIS — N2581 Secondary hyperparathyroidism of renal origin: Secondary | ICD-10-CM | POA: Diagnosis not present

## 2020-10-06 DIAGNOSIS — Z8249 Family history of ischemic heart disease and other diseases of the circulatory system: Secondary | ICD-10-CM

## 2020-10-06 DIAGNOSIS — K760 Fatty (change of) liver, not elsewhere classified: Secondary | ICD-10-CM | POA: Diagnosis present

## 2020-10-06 DIAGNOSIS — Z992 Dependence on renal dialysis: Secondary | ICD-10-CM | POA: Diagnosis not present

## 2020-10-06 DIAGNOSIS — I120 Hypertensive chronic kidney disease with stage 5 chronic kidney disease or end stage renal disease: Secondary | ICD-10-CM | POA: Diagnosis not present

## 2020-10-06 DIAGNOSIS — Z9842 Cataract extraction status, left eye: Secondary | ICD-10-CM

## 2020-10-06 DIAGNOSIS — N186 End stage renal disease: Secondary | ICD-10-CM | POA: Diagnosis not present

## 2020-10-06 DIAGNOSIS — Z79899 Other long term (current) drug therapy: Secondary | ICD-10-CM

## 2020-10-06 DIAGNOSIS — I864 Gastric varices: Secondary | ICD-10-CM | POA: Diagnosis present

## 2020-10-06 DIAGNOSIS — D696 Thrombocytopenia, unspecified: Secondary | ICD-10-CM | POA: Diagnosis not present

## 2020-10-06 DIAGNOSIS — K7469 Other cirrhosis of liver: Secondary | ICD-10-CM | POA: Diagnosis not present

## 2020-10-06 DIAGNOSIS — Z89422 Acquired absence of other left toe(s): Secondary | ICD-10-CM | POA: Diagnosis not present

## 2020-10-06 DIAGNOSIS — Z794 Long term (current) use of insulin: Secondary | ICD-10-CM

## 2020-10-06 DIAGNOSIS — Z9889 Other specified postprocedural states: Secondary | ICD-10-CM

## 2020-10-06 DIAGNOSIS — E1129 Type 2 diabetes mellitus with other diabetic kidney complication: Secondary | ICD-10-CM | POA: Diagnosis present

## 2020-10-06 DIAGNOSIS — Z961 Presence of intraocular lens: Secondary | ICD-10-CM | POA: Diagnosis present

## 2020-10-06 DIAGNOSIS — N250 Renal osteodystrophy: Secondary | ICD-10-CM | POA: Diagnosis not present

## 2020-10-06 LAB — CBC WITH DIFFERENTIAL/PLATELET
Abs Immature Granulocytes: 0.01 10*3/uL (ref 0.00–0.07)
Basophils Absolute: 0 10*3/uL (ref 0.0–0.1)
Basophils Relative: 0 %
Eosinophils Absolute: 0.2 10*3/uL (ref 0.0–0.5)
Eosinophils Relative: 5 %
HCT: 37.5 % — ABNORMAL LOW (ref 39.0–52.0)
Hemoglobin: 11.7 g/dL — ABNORMAL LOW (ref 13.0–17.0)
Immature Granulocytes: 0 %
Lymphocytes Relative: 41 %
Lymphs Abs: 1.5 10*3/uL (ref 0.7–4.0)
MCH: 27.3 pg (ref 26.0–34.0)
MCHC: 31.2 g/dL (ref 30.0–36.0)
MCV: 87.6 fL (ref 80.0–100.0)
Monocytes Absolute: 0.5 10*3/uL (ref 0.1–1.0)
Monocytes Relative: 14 %
Neutro Abs: 1.5 10*3/uL — ABNORMAL LOW (ref 1.7–7.7)
Neutrophils Relative %: 40 %
Platelets: 68 10*3/uL — ABNORMAL LOW (ref 150–400)
RBC: 4.28 MIL/uL (ref 4.22–5.81)
RDW: 14.6 % (ref 11.5–15.5)
WBC: 3.7 10*3/uL — ABNORMAL LOW (ref 4.0–10.5)
nRBC: 0 % (ref 0.0–0.2)

## 2020-10-06 LAB — COMPREHENSIVE METABOLIC PANEL
ALT: 16 U/L (ref 0–44)
AST: 34 U/L (ref 15–41)
Albumin: 2.8 g/dL — ABNORMAL LOW (ref 3.5–5.0)
Alkaline Phosphatase: 160 U/L — ABNORMAL HIGH (ref 38–126)
Anion gap: 14 (ref 5–15)
BUN: 32 mg/dL — ABNORMAL HIGH (ref 8–23)
CO2: 27 mmol/L (ref 22–32)
Calcium: 8.7 mg/dL — ABNORMAL LOW (ref 8.9–10.3)
Chloride: 94 mmol/L — ABNORMAL LOW (ref 98–111)
Creatinine, Ser: 4.93 mg/dL — ABNORMAL HIGH (ref 0.61–1.24)
GFR, Estimated: 12 mL/min — ABNORMAL LOW (ref >60)
Glucose, Bld: 82 mg/dL (ref 70–99)
Potassium: 4.3 mmol/L (ref 3.5–5.1)
Sodium: 135 mmol/L (ref 135–145)
Total Bilirubin: 1.5 mg/dL — ABNORMAL HIGH (ref 0.3–1.2)
Total Protein: 6.8 g/dL (ref 6.5–8.1)

## 2020-10-06 LAB — RESP PANEL BY RT-PCR (FLU A&B, COVID) ARPGX2
Influenza A by PCR: NEGATIVE
Influenza B by PCR: NEGATIVE
SARS Coronavirus 2 by RT PCR: NEGATIVE

## 2020-10-06 LAB — GLUCOSE, CAPILLARY: Glucose-Capillary: 369 mg/dL — ABNORMAL HIGH (ref 70–99)

## 2020-10-06 LAB — CBG MONITORING, ED: Glucose-Capillary: 153 mg/dL — ABNORMAL HIGH (ref 70–99)

## 2020-10-06 MED ORDER — ACETAMINOPHEN 325 MG PO TABS: 650.0000 mg | ORAL_TABLET | Freq: Four times a day (QID) | ORAL | Status: DC | PRN

## 2020-10-06 MED ORDER — ONDANSETRON 4 MG PO TBDP: 4.0000 mg | ORAL_TABLET | Freq: Two times a day (BID) | ORAL | Status: DC | PRN

## 2020-10-06 MED ORDER — LACTULOSE 10 GM/15ML PO SOLN
30.0000 g | Freq: Three times a day (TID) | ORAL | Status: DC
  Administered 2020-10-06 – 2020-10-07 (×2): 30 g via ORAL
  Filled 2020-10-06 (×6): qty 45

## 2020-10-06 MED ORDER — ASPIRIN 81 MG PO CHEW
81.0000 mg | CHEWABLE_TABLET | Freq: Every day | ORAL | Status: DC
  Administered 2020-10-06 – 2020-10-07 (×2): 81 mg via ORAL
  Filled 2020-10-06 (×2): qty 1

## 2020-10-06 MED ORDER — ROSUVASTATIN CALCIUM 5 MG PO TABS
5.0000 mg | ORAL_TABLET | Freq: Every day | ORAL | Status: DC
  Administered 2020-10-06 – 2020-10-07 (×2): 5 mg via ORAL
  Filled 2020-10-06 (×2): qty 1

## 2020-10-06 MED ORDER — VANCOMYCIN HCL 750 MG/150ML IV SOLN: 750.0000 mg | INTRAVENOUS | Status: DC

## 2020-10-06 MED ORDER — DOXERCALCIFEROL 4 MCG/2ML IV SOLN
1.0000 ug | INTRAVENOUS | Status: DC
  Administered 2020-10-07: 1 ug via INTRAVENOUS
  Filled 2020-10-06: qty 2

## 2020-10-06 MED ORDER — INSULIN GLARGINE 100 UNIT/ML ~~LOC~~ SOLN
7.0000 [IU] | Freq: Every day | SUBCUTANEOUS | Status: DC
  Administered 2020-10-06: 7 [IU] via SUBCUTANEOUS
  Filled 2020-10-06 (×4): qty 0.07

## 2020-10-06 MED ORDER — RIFAXIMIN 550 MG PO TABS
550.0000 mg | ORAL_TABLET | Freq: Two times a day (BID) | ORAL | Status: DC
  Administered 2020-10-06 – 2020-10-07 (×3): 550 mg via ORAL
  Filled 2020-10-06 (×4): qty 1

## 2020-10-06 MED ORDER — VANCOMYCIN HCL 1500 MG/300ML IV SOLN
1500.0000 mg | Freq: Once | INTRAVENOUS | Status: AC
  Administered 2020-10-06: 1500 mg via INTRAVENOUS
  Filled 2020-10-06: qty 300

## 2020-10-06 MED ORDER — PIPERACILLIN-TAZOBACTAM 3.375 G IVPB 30 MIN
3.3750 g | Freq: Once | INTRAVENOUS | Status: AC
  Administered 2020-10-06: 3.375 g via INTRAVENOUS
  Filled 2020-10-06: qty 50

## 2020-10-06 MED ORDER — PIPERACILLIN-TAZOBACTAM IN DEX 2-0.25 GM/50ML IV SOLN
2.2500 g | Freq: Three times a day (TID) | INTRAVENOUS | Status: DC
  Filled 2020-10-06 (×2): qty 50

## 2020-10-06 MED ORDER — ACETAMINOPHEN 650 MG RE SUPP: 650.0000 mg | Freq: Four times a day (QID) | RECTAL | Status: DC | PRN

## 2020-10-06 MED ORDER — INSULIN ASPART 100 UNIT/ML ~~LOC~~ SOLN
0.0000 [IU] | Freq: Three times a day (TID) | SUBCUTANEOUS | Status: DC
  Administered 2020-10-06: 17:00:00 2 [IU] via SUBCUTANEOUS
  Administered 2020-10-07: 7 [IU] via SUBCUTANEOUS
  Filled 2020-10-06: qty 1

## 2020-10-06 MED ORDER — PIPERACILLIN-TAZOBACTAM IN DEX 2-0.25 GM/50ML IV SOLN: 2.2500 g | Freq: Three times a day (TID) | INTRAVENOUS | Status: DC

## 2020-10-06 NOTE — ED Notes (Signed)
Pt eating his food tray.

## 2020-10-06 NOTE — Telephone Encounter (Signed)
Dr. Posey Pronto spoke with [facility] PA, and told her to admit the patient, as well as start on antibiotics. Dr. Sherryle Lis will be to round on this patient this afternoon. He is currently in surgery and is aware that the patient need to be seen.

## 2020-10-06 NOTE — Progress Notes (Signed)
ABI's have been completed. Preliminary results can be found in CV Proc through chart review.   10/06/20 4:12 PM William [name] RVT

## 2020-10-06 NOTE — Plan of Care (Addendum)
Consult noted, will see this afternoon  Likely needs non invasive vascular examination given gangrenous appearance of toe, would order ABI/TBI with arterial US  Bethene Hankinson, DPM 10/06/2020

## 2020-10-06 NOTE — ED Provider Notes (Signed)
[facility] EMERGENCY DEPARTMENT Provider Note   CSN: [id number] Arrival date & time: 10/06/20  7915     History Chief Complaint  Patient presents with  . Necrotic Toe    William [name] is a 70 y.o. male medical history significant for anemia, CHF, cirrhosis, ESRD on dialysis T/TH/S at [facility], Goodpasture's disease, hypertension, pancytopenia, type 2 diabetes.  HPI Patient presents to emergency room today with chief complaint of necrotic toe x1 week.  Patient states he clipped his toenail and thinks he might of also cut the skin around his toe.  It started to look infected so he went to see podiatrist Dr. Paulla Dolly on 09/27/20.  He was prescribed doxycycline x 10 days, currently on day 9 of antibiotic. Yesterday he told his son his toe turned black. He does not feel like the infection is improving.  He states his skin on his toe continues to fall off. He has been applying silvadene and wrapping in a bandage.  He still has feeling in his toe. Denies any associated pain.  Denies any fever, chills, numbness, tingling or weakness.  Due to language barrier, patient's son interpreted during the history-taking and subsequent discussion (and for part of the physical exam) with this patient. Unable to get video interpretor to connect.      Past Medical History:  Diagnosis Date  . Anemia   . Blind one eye   . CHF (congestive heart failure) ([place])   . Cirrhosis ([place])   . ESRD on hemodialysis ([place])   . Goodpasture's disease [facility])    on outpatient plasmapheresis/notes 11/20/2017  . Grade I diastolic dysfunction 05/69/7948  . History of anemia due to chronic kidney disease   . History of blood transfusion X 1   "UGIB; low blood count"  . History of plasmapheresis    "qod" (11/20/2017)  . Hypertension   . Pancytopenia ([place]) 08/20/2017  . Type II diabetes mellitus [facility])     Patient Active Problem List   Diagnosis Date Noted  . Metabolic encephalopathy 01/65/5374  . Goals of  care, counseling/discussion   . Palliative care by specialist   . Hepatic encephalopathy ([place]) 05/02/2020  . GIB (gastrointestinal bleeding) 04/16/2020  . Hyperkalemia, diminished renal excretion 06/01/2019  . AV fistula thrombosis, initial encounter ([place]) 06/01/2019  . AV fistula occlusion, initial encounter ([place])   . Chronic systolic CHF (congestive heart failure) ([place])   . Encounter for other preprocedural examination 09/23/2018  . Esophageal varices ([place]) 09/23/2018  . Thrombocytopenia ([place]) 09/23/2018  . CHF (congestive heart failure) ([place]) 09/22/2018  . ESRD on dialysis ([place]) 02/06/2018  . CKD (chronic kidney disease) stage 4, GFR 15-29 ml/min (HCC) 12/19/2017  . Cirrhosis of liver with ascites ([place]) 12/09/2017  . Goodpasture's syndrome ([place]) with associated hemoptysis 11/20/2017  . Acute on chronic diastolic CHF (congestive heart failure) ([place]) 11/06/2017  . Pancytopenia ([place]) 11/06/2017  . GERD (gastroesophageal reflux disease) 11/06/2017  . Essential hypertension 11/06/2017  . Type II diabetes mellitus with renal manifestations ([place]) 08/25/2015    Past Surgical History:  Procedure Laterality Date  . AV FISTULA PLACEMENT Left 12/26/2017   Procedure: LEFT BRACHIOCEPHALIC ARTERIOVENOUS (AV) FISTULA CREATION;  Surgeon: Conrad Aredale, MD;  Location: [place];  Service: Vascular;  Laterality: Left;  . BASCILIC VEIN TRANSPOSITION Left 02/16/2018   Procedure: BRACHIOBASILIC VEIN TRANSPOSITION SECOND STAGE;  Surgeon: Conrad [place], MD;  Location: [place];  Service: Vascular;  Laterality: Left;  . CATARACT EXTRACTION W/ INTRAOCULAR LENS  IMPLANT,  BILATERAL Bilateral   . ESOPHAGEAL BANDING N/A 04/01/2018   Procedure: ESOPHAGEAL BANDING;  Surgeon: Otis Brace, MD;  Location: [facility];  Service: Gastroenterology;  Laterality: N/A;  . ESOPHAGEAL BANDING N/A 06/29/2018   Procedure: ESOPHAGEAL BANDING;  Surgeon: Otis Brace, MD;  Location: [facility];  Service: Gastroenterology;   Laterality: N/A;  . ESOPHAGOGASTRODUODENOSCOPY (EGD) WITH PROPOFOL N/A 04/01/2018   Procedure: ESOPHAGOGASTRODUODENOSCOPY (EGD) WITH PROPOFOL;  Surgeon: Otis Brace, MD;  [facility];  Service: Gastroenterology;  Laterality: N/A;  . ESOPHAGOGASTRODUODENOSCOPY (EGD) WITH PROPOFOL N/A 06/29/2018   Procedure: ESOPHAGOGASTRODUODENOSCOPY (EGD) WITH PROPOFOL;  Surgeon: Otis Brace, MD;  [facility];  Service: Gastroenterology;  Laterality: N/A;  . ESOPHAGOGASTRODUODENOSCOPY (EGD) WITH PROPOFOL N/A 09/30/2018   Procedure: ESOPHAGOGASTRODUODENOSCOPY (EGD) WITH PROPOFOL;  Surgeon: Otis Brace, MD;  [facility]: [facility];  Service: Gastroenterology;  Laterality: N/A;  . ESOPHAGOGASTRODUODENOSCOPY (EGD) WITH PROPOFOL N/A 04/20/2019   Procedure: ESOPHAGOGASTRODUODENOSCOPY (EGD) WITH PROPOFOL;  Surgeon: Otis Brace, MD;  [facility]: [facility];  Service: Gastroenterology;  Laterality: N/A;  . ESOPHAGOGASTRODUODENOSCOPY (EGD) WITH PROPOFOL N/A 04/17/2020   Procedure: ESOPHAGOGASTRODUODENOSCOPY (EGD) WITH PROPOFOL;  Surgeon: Wonda Horner, MD;  Location: [place];  Service: Gastroenterology;  Laterality: N/A;  . I & D EXTREMITY Left 02/18/2018   Procedure: IRRIGATION AND DEBRIDEMENT LEFT ARM;  Surgeon: Serafina Mitchell, MD;  Location: [facility];  Service: Vascular;  Laterality: Left;  . INSERTION OF DIALYSIS CATHETER N/A 01/19/2018   Procedure: INSERTION OF DIALYSIS CATHETER - RIGHT INTERNAL JUGULAR PLACEMENT;  Surgeon: Rosetta Posner, MD;  Location: [place];  Service: Vascular;  Laterality: N/A;  . IR FLUORO GUIDE CV LINE RIGHT  11/10/2017  . IR PARACENTESIS  12/10/2017  . IR PARACENTESIS  12/26/2017  . IR REMOVAL TUN CV CATH W/O FL  11/28/2017  . IR THROMBECTOMY AV FISTULA W/THROMBOLYSIS/PTA INC/SHUNT/IMG LEFT Left 06/02/2019  . IR US GUIDE VASC ACCESS LEFT  06/02/2019  . IR US GUIDE VASC ACCESS RIGHT  11/10/2017  . NECK SURGERY  2007   "back of neck; no hardware in there"        Family History  Problem Relation Age of Onset  . Diabetes Mellitus II Sister   . Diabetes Sister   . Stroke Brother   . Heart attack Brother     Social History   Tobacco Use  . Smoking status: Never Smoker  . Smokeless tobacco: Never Used  Vaping Use  . Vaping Use: Never used  Substance Use Topics  . Alcohol use: Never  . Drug use: Never    Home Medications Prior to Admission medications   Medication Sig Start Date End Date Taking? Authorizing Provider  calcium acetate (PHOSLO) 667 MG capsule Take 1 capsule (667 mg total) by mouth 3 (three) times daily with meals. 05/11/20   Aline August, MD  doxycycline (VIBRA-TABS) 100 MG tablet Take 1 tablet (100 mg total) by mouth 2 (two) times daily. 09/27/20   Wallene Huh, DPM  glucose blood (ONETOUCH VERIO) test strip USE 1 STRIP TO CHECK GLUCOSE THREE TIMES DAILY. DX:E11.65 01/31/20   Elayne Snare, MD  insulin degludec (TRESIBA FLEXTOUCH) 100 UNIT/ML FlexTouch Pen Inject 14 Units into the skin daily. Inject 14 units under the skin once daily. NO FURTHER REFILLS UNTIL PATIENT IS SEEN IN THE OFFICE 09/04/20   Elayne Snare, MD  Insulin Pen Needle (ADVOCATE INSULIN PEN NEEDLES) 31G X 5 MM MISC Use four times daily to inject insulin 04/27/18   Elayne Snare, MD  insulin regular (  NOVOLIN R,HUMULIN R) 100 units/mL injection Inject 6-8 Units into the skin See admin instructions. INJECT 6 UNITS UNDER THE SKIN AT BREAKFAST, 8 UNITS AT LUNCH AND 8 UNITS AT DINNER.    [provider]  lactulose (CHRONULAC) 10 GM/15ML solution Take 45 mLs (30 g total) by mouth 3 (three) times daily. 05/11/20   Aline August, MD  lidocaine-prilocaine (EMLA) cream Apply 1 application topically Every Tuesday,Thursday,and Saturday with dialysis. 08/24/18   [provider]  Methoxy PEG-Epoetin Beta (MIRCERA IJ) 1 Dose See admin instructions. Dialysis gives this medication 05/09/20 05/08/21  [provider]  Multiple Vitamins-Minerals (ONE-A-DAY  MENS 50+ PO) Take 1 tablet by mouth daily.    [provider]  ondansetron (ZOFRAN ODT) 4 MG disintegrating tablet Take 1 tablet (4 mg total) by mouth 2 (two) times daily as needed for nausea or vomiting. 05/01/20   [name], Madelynn Done C, PA-C  ONE TOUCH LANCETS MISC Use to test blood sugar three times daily 04/27/18   Elayne Snare, MD  pantoprazole (PROTONIX) 40 MG tablet Take 1 tablet (40 mg total) by mouth 2 (two) times daily. 05/04/20   Domenic Polite, MD  rifaximin (XIFAXAN) 550 MG TABS tablet Take 1 tablet (550 mg total) by mouth 2 (two) times daily. 07/07/20   Shawna Clamp, MD  rosuvastatin (CRESTOR) 5 MG tablet Take 1 tablet by mouth once daily Patient taking differently: Take 5 mg by mouth daily. 04/25/20   Elayne Snare, MD  sevelamer carbonate (RENVELA) 800 MG tablet Take 800 mg by mouth 3 (three) times daily. 09/25/20   [provider]    Allergies    Heparin and Pork-derived products  Review of Systems   Review of Systems All other systems are reviewed and are negative for acute change except as noted in the HPI.  Physical Exam Updated Vital Signs BP (!) 162/68 (BP Location: Right Arm)   Pulse 84   Temp 98.2 F (36.8 C) (Oral)   Resp 16   Ht 5' 5"  (1.651 m)   Wt 65 kg   SpO2 100%   BMI 23.85 kg/m   Physical Exam Vitals and nursing note reviewed.  Constitutional:      General: He is not in acute distress.    Appearance: He is not ill-appearing.  HENT:     Head: Normocephalic and atraumatic.     Right Ear: Tympanic membrane and external ear normal.     Left Ear: Tympanic membrane and external ear normal.     Nose: Nose normal.     Mouth/Throat:     Mouth: Mucous membranes are moist.     Pharynx: Oropharynx is clear.  Eyes:     General: No scleral icterus.       Right eye: No discharge.        Left eye: No discharge.     Extraocular Movements: Extraocular movements intact.     Conjunctiva/sclera: Conjunctivae normal.     Pupils: Pupils are equal,  round, and reactive to light.  Neck:     Vascular: No JVD.  Cardiovascular:     Rate and Rhythm: Normal rate and regular rhythm.     Pulses: Normal pulses.          Radial pulses are 2+ on the right side and 2+ on the left side.       Dorsalis pedis pulses are 2+ on the right side and 2+ on the left side.     Heart sounds: Normal heart sounds.  Pulmonary:  Comments: Lungs clear to auscultation in all fields. Symmetric chest rise. No wheezing, rales, or rhonchi. Abdominal:     Comments: Abdomen is soft, non-distended, and non-tender in all quadrants. No rigidity, no guarding. No peritoneal signs.  Musculoskeletal:        General: Normal range of motion.     Cervical back: Normal range of motion.  Feet:     Comments: See media below.  Necrotic third toe on left foot. Sensation intact. Degloving as well. Full ROM of toe.  Equal tactile temperature in bilateral feet. Skin:    General: Skin is warm and dry.     Capillary Refill: Capillary refill takes less than 2 seconds.  Neurological:     Mental Status: He is oriented to person, place, and time.     GCS: GCS eye subscore is 4. GCS verbal subscore is 5. GCS motor subscore is 6.     Comments: Fluent speech, no facial droop.  Psychiatric:        Behavior: Behavior normal.          ED Results / Procedures / Treatments   Labs (all labs ordered are listed, but only abnormal results are displayed) Labs Reviewed  CBC WITH DIFFERENTIAL/PLATELET  COMPREHENSIVE METABOLIC PANEL    EKG None  Radiology DG Toe 3rd Left  Result Date: 10/06/2020 CLINICAL DATA:  Third toe pain and known diabetic ulcer EXAM: LEFT THIRD TOE COMPARISON:  04/03/2020 FINDINGS: Soft tissue ulcer is noted along the distal aspect of the third toe. No definitive bony erosive changes are noted to suggest osteomyelitis. No fracture is noted. IMPRESSION: Soft tissue ulcer in the distal third toe. No underlying bony destructive changes are seen. Electronically  Signed   By: Inez Catalina M.D.   On: 10/06/2020 07:12    Procedures Procedures   Medications Ordered in ED Medications - No data to display  ED Course  I have reviewed the triage vital signs and the nursing notes.  Pertinent labs & imaging results that were available during my care of the patient were reviewed by me and considered in my medical decision making (see chart for details).    MDM Rules/Calculators/A&P                          History provided by patient with additional history obtained from chart review.    Presenting with toe infection. Afebrile HDS. Patient is non toxic appearing. On exam toe is necrotic with degloving. No purulent drainage, still has sensation in the toes. He is on day 9/10 of doxycycline. Xray of toe shows no bony destruction, has soft tissue ulcer. Labs collected include CBC and CMP which are consistent with his baseline. No leukocytosis, has known leukopenia.   Consulte [facility] and case discussed with Dr. Posey Pronto. Dr. Sherryle Lis is on call provider however is currently in surgery and unable to take consult. Dr. Posey Pronto viewed imaging and recommends hospital admission with plan for surgical intervention needed for amputation. He asked for broad spec antibiotic coverage vanc and zosyn to be started. Antibiotics ordered, covid swab in process. Updated patient and son who are agreeable with plan of care. Dr. Sherryle Lis will see patient in consult later today.  Unassigned admission. Spoke with IM service who agrees to assume care of patient and bring into the hospital for further evaluation and management.      Portions of this note were generated with [employer]. Dictation errors may  occur despite best attempts at proofreading.     Final Clinical Impression(s) / ED Diagnoses Final diagnoses:  Diabetic foot infection [facility])    Rx / DC Orders ED Discharge Orders    None       Lewanda Rife 10/06/20 1128     Sherwood Gambler, MD 10/10/20 1645

## 2020-10-06 NOTE — H&P (Addendum)
Date: 10/06/2020               Patient Name:  William [name] MRN: [id number]  DOB: 1950/09/13 Age / Sex: 70 y.o., male   PCP: Horald Pollen, MD         Medical Service: Internal Medicine Teaching Service         Attending Physician: Dr. Evette Doffing, Mallie Mussel, *    First Contact: Dr. Allyson Sabal Pager: [phone]  Second Contact: Dr. Court Joy Pager: [phone]       After Hours (After 5p/  First Contact Pager: [phone]  weekends / holidays): Second Contact Pager: [phone]   Chief Complaint: Necrotic toe  History of Present Illness: This is a 45 Arabic/Sudanese speaking male, accompanied by his son who helps translate, with a history of DM type 2, ESRD Tuesday Thursday Saturday, Goodpasture syndrome, hypertension, HFpEF (last EF >55%), and cirrhosis secondary to NAFLD with gastric varices and pancytopenia, currently being worked up for a kidney and/or liver transplant, who is presenting for worsening left middle toe infection.  He had seen podiatry on 2/23 after he had clipped his toenail and started to bleed profusely, it was noted that he was neurovascularly intact, with mild degloving of the third distal digit, with minimal warmth noted, he had Silvadene dressing applied and placed on doxycycline twice daily which he has been taking.  Since then patient states that the 2 continue to worsen and started to darken, and became completely black.  He denies any fevers, chills, nausea, vomiting, chest pain, shortness of breath, headaches, lightheadedness, dizziness, or any other symptoms.  Patient has a history of diabetes, reports good sensation in his feet, with no numbness or tingling.  The son states that he went to dialysis yesterday, did not have any other issues.  Echocardiogram on 09/24/2018 showed a normal EF, normal wall motion, mild LVH, trivial AR/MR/TR/PR, mildly late positive micro cavitation suggestive of extracardiac shunting.  In the ER patient was noted to be afebrile, vital signs  stable other than pretension to 177/75.  Labs significant for a WBC of 3.7, hemoglobin 9.7, platelets 68, appears to be chronic and close to his baseline levels.  CMP showed sodium 135, potassium 4.3, chloride 94, CO2 27, creatinine 4.93.  He had an x-ray of his left toe that showed a soft tissue ulcer in the distal third toe, no underlying bony destructive changes are seen.  He was started on Vanco and Zosyn.  EDP spoke with podiatry, who will consult on patient later this afternoon.  He was admitted to internal medicine teaching service.  Meds:  No current facility-administered medications on file prior to encounter.   Current Outpatient Medications on File Prior to Encounter  Medication Sig Dispense Refill  . calcium acetate (PHOSLO) 667 MG capsule Take 1 capsule (667 mg total) by mouth 3 (three) times daily with meals.    Marland Kitchen doxycycline (VIBRA-TABS) 100 MG tablet Take 1 tablet (100 mg total) by mouth 2 (two) times daily. 20 tablet 0  . insulin degludec (TRESIBA FLEXTOUCH) 100 UNIT/ML FlexTouch Pen Inject 14 Units into the skin daily. Inject 14 units under the skin once daily. NO FURTHER REFILLS UNTIL PATIENT IS SEEN IN THE OFFICE (Patient taking differently: Inject 12 Units into the skin daily after breakfast. NO FURTHER REFILLS UNTIL PATIENT IS SEEN IN THE OFFICE) 2 mL 0  . insulin regular (NOVOLIN R,HUMULIN R) 100 units/mL injection Inject 6-8 Units into the skin See admin instructions. Inject 8 units subcutaneously  with breakfast, and 6 units with lunch and supper    . lactulose (CHRONULAC) 10 GM/15ML solution Take 45 mLs (30 g total) by mouth 3 (three) times daily. 946 mL 1  . lidocaine-prilocaine (EMLA) cream Apply 1 application topically See admin instructions. Apply topically prior to dialysis on Tuesday, Thursday, Saturday    . Multiple Vitamin (MULTIVITAMIN WITH MINERALS) TABS tablet Take 1 tablet by mouth daily.    . ondansetron (ZOFRAN ODT) 4 MG disintegrating tablet Take 1 tablet (4 mg  total) by mouth 2 (two) times daily as needed for nausea or vomiting. 20 tablet 0  . pantoprazole (PROTONIX) 40 MG tablet Take 1 tablet (40 mg total) by mouth 2 (two) times daily. 30 tablet 1  . rifaximin (XIFAXAN) 550 MG TABS tablet Take 1 tablet (550 mg total) by mouth 2 (two) times daily. 60 tablet 1  . rosuvastatin (CRESTOR) 5 MG tablet Take 1 tablet by mouth once daily (Patient taking differently: Take 5 mg by mouth daily.) 90 tablet 0  . sevelamer carbonate (RENVELA) 800 MG tablet Take 800 mg by mouth 3 (three) times daily.    Marland Kitchen glucose blood (ONETOUCH VERIO) test strip USE 1 STRIP TO CHECK GLUCOSE THREE TIMES DAILY. DX:E11.65 150 each 0  . Insulin Pen Needle (ADVOCATE INSULIN PEN NEEDLES) 31G X 5 MM MISC Use four times daily to inject insulin 150 each 1  . Methoxy PEG-Epoetin Beta (MIRCERA IJ) 1 Dose See admin instructions. Dialysis gives this medication    . ONE TOUCH LANCETS MISC Use to test blood sugar three times daily 100 each 2   Allergies: Allergies as of 10/06/2020 - Review Complete 10/06/2020  Allergen Reaction Noted  . Heparin Other (See Comments) 11/11/2017  . Pork-derived products Other (See Comments) 02/18/2018   Past Medical History:  Diagnosis Date  . Anemia   . Blind one eye   . CHF (congestive heart failure) ([place])   . Cirrhosis ([place])   . ESRD on hemodialysis ([place])   . Goodpasture's disease [facility])    on outpatient plasmapheresis/notes 11/20/2017  . Grade I diastolic dysfunction 94/50/3888  . History of anemia due to chronic kidney disease   . History of blood transfusion X 1   "UGIB; low blood count"  . History of plasmapheresis    "qod" (11/20/2017)  . Hypertension   . Pancytopenia ([place]) 08/20/2017  . Type II diabetes mellitus ([place])     Family History: Denies any significant family history, such as diabetes, kidney or liver issues.  Social History: Denies smoking, drinking, or any recreational drug use.  Currently lives with his son.  He used to drive cars  for living, retired in 2018.  Review of Systems: A complete ROS was negative except as per HPI.   Physical Exam: Blood pressure (!) 159/64, pulse 81, temperature 98 F (36.7 C), temperature source Oral, resp. rate 17, height 5' 5"  (1.651 m), weight 65 kg, SpO2 100 %. Physical Exam Constitutional:      Appearance: Normal appearance.  HENT:     Head: Normocephalic and atraumatic.     Mouth/Throat:     Mouth: Mucous membranes are moist.     Pharynx: Oropharynx is clear.  Eyes:     Extraocular Movements: Extraocular movements intact.     Conjunctiva/sclera: Conjunctivae normal.     Pupils: Pupils are equal, round, and reactive to light.  Cardiovascular:     Rate and Rhythm: Normal rate and regular rhythm.     Pulses:  Dorsalis pedis pulses are 3+ on the right side and 3+ on the left side.     Heart sounds: Murmur (Systolic 2/6 murmur, loudest at RUSB) heard.    Pulmonary:     Effort: Pulmonary effort is normal. No respiratory distress.     Breath sounds: Normal breath sounds.  Abdominal:     General: Abdomen is flat. Bowel sounds are normal. There is no distension.     Palpations: Abdomen is soft.     Tenderness: There is no abdominal tenderness. There is no rebound.  Musculoskeletal:       Feet:  Feet:     Left foot:     Skin integrity: Warmth (Mild warmth of left foot, no erythema or edema noted) present.  Skin:    General: Skin is warm and dry.     Capillary Refill: Capillary refill takes less than 2 seconds.  Neurological:     General: No focal deficit present.     Mental Status: He is alert and oriented to person, place, and time.  Psychiatric:        Mood and Affect: Mood normal.        Behavior: Behavior normal.    Left foot x-ray: IMPRESSION:  Soft tissue ulcer in the distal third toe. No underlying bony  destructive changes are seen.   Assessment & Plan by Problem: Active Problems:   Diabetic foot infection [facility])  This is a 66 Arabic/Sudanese  speaking male, accompanied by his son who helps translate, with a history of DM type 2, ESRD Tuesday Thursday Saturday, Goodpasture syndrome, hypertension, HFpEF (last EF >55%), and cirrhosis secondary to NAFLD with gastric varices and pancytopenia, currently being worked up for a kidney and/or liver transplant, who is presenting for worsening left middle toe infection.  Is currently hemodynamically stable, no systemic signs of infection.  Noted to have a left necrotic third digit.   Left 3rd digit necrotic toe: Patient has black left third digit after sustaining trauma to the toe from the left toenail clipping, he completed 9 days of doxycycline with worsening symptoms. He reports mild pain on exam but no pain at rest. He currently has good pulses, sensation to light touch and pinprick intact bilaterally.  His last A1c on 2/4 2 was he has no systemic symptoms, labs showed persistent pancytopenia.  Currently on Vanco and Zosyn per podiatry, may not require this if more ischemic cause.  Podiatry will see later today to discuss further management, may need amputation.  -Continue Vanco and Zosyn -Podiatry consulted, appreciate recommendations -Obtaining ABIs/TBI -ASA 81 mg daily -Daily CBC and BMP -Tylenol for pain -Can add low dose opioid if pain  ESRD on HD TTS Goodpasture syndrome: Being evaluated for kidney transplant: Patient goes to dialysis at [facility] kidney care.  States that he went yesterday, denies any issues with dialysis.  He does not appear volume overloaded on exam, electrolytes are stable.  No emergent need for dialysis.  We will continue to monitor for now, consult nephrology in a.m. for dialysis.  -BMP in a.m. -We will need nephrology consult  Type 2 diabetes: Patient has a history of type 2 diabetes, is currently on Tresiba 14 units daily, and Novolin 8 units in the morning, and 6 units in the evening with meals.  His last A1c was 6.9 on 2/4 however patient does have  pancytopenia is on dialysis.  His blood sugars today have been around 90.  -Start long-acting insulin 7 units daily, can titrate upwards as  needed -Start SSI-S -Frequent CBGs  Cirrhosis secondary to NAFLD: Being evaluated for liver transplant eval: Pancytopenia: Currently being evaluated for a liver transplant at [facility] liver center, his last meld score was 23.  His pancytopenia is thought to be secondary to his liver condition, WBC of 3.7, hemoglobin 11.7, and platelets 68.  He has been taking lactulose and rifaximin.   -Resume home lactulose and rifaximin -Daily CBC  Dispo: Admit patient to Inpatient with expected length of stay greater than 2 midnights.  Signed: Asencion Noble, MD 10/06/2020, 11:34 AM  Pager: [phone] After 5pm on weekdays and 1pm on weekends: On Call pager: [phone]

## 2020-10-06 NOTE — Consult Note (Signed)
Reason for Consult:gangrene of toe Referring Physician: Lalla Brothers, MD  William [name] is an 70 y.o. male.  HPI: 70 yo presents to ED with a gangrenous third toe.  He saw Dr. Paulla Dolly recently in our office after clipping part of his toe and developing infection.  He was placed on doxycycline.  His son states he was doing okay until about 2 days ago when it started to turn black.  Speaks Arabic and his son provides translation for me.  Does not particularly hurt.  Says his blood sugar has been good and is kept his diabetes under control.  He is a dialysis patient.  Past Medical History:  Diagnosis Date  . Anemia   . Blind one eye   . CHF (congestive heart failure) ([place])   . Cirrhosis ([place])   . ESRD on hemodialysis ([place])   . Goodpasture's disease [facility])    on outpatient plasmapheresis/notes 11/20/2017  . Grade I diastolic dysfunction 25/36/6440  . History of anemia due to chronic kidney disease   . History of blood transfusion X 1   "UGIB; low blood count"  . History of plasmapheresis    "qod" (11/20/2017)  . Hypertension   . Pancytopenia ([place]) 08/20/2017  . Type II diabetes mellitus ([place])     Past Surgical History:  Procedure Laterality Date  . AV FISTULA PLACEMENT Left 12/26/2017   Procedure: LEFT BRACHIOCEPHALIC ARTERIOVENOUS (AV) FISTULA CREATION;  Surgeon: Conrad [place], MD;  Location: [place];  Service: Vascular;  Laterality: Left;  . BASCILIC VEIN TRANSPOSITION Left 02/16/2018   Procedure: BRACHIOBASILIC VEIN TRANSPOSITION SECOND STAGE;  Surgeon: Conrad Rand, MD;  Location: [place];  Service: Vascular;  Laterality: Left;  . CATARACT EXTRACTION W/ INTRAOCULAR LENS  IMPLANT, BILATERAL Bilateral   . ESOPHAGEAL BANDING N/A 04/01/2018   Procedure: ESOPHAGEAL BANDING;  Surgeon: Otis Brace, MD;  [facility];  Service: Gastroenterology;  Laterality: N/A;  . ESOPHAGEAL BANDING N/A 06/29/2018   Procedure: ESOPHAGEAL BANDING;  Surgeon: Otis Brace, MD;  [facility]: [facility]  ENDOS[facility];  Service: Gastroenterology;  Laterality: N/A;  . ESOPHAGOGASTRODUODENOSCOPY (EGD) WITH PROPOFOL N/A 04/01/2018   Procedure: ESOPHAGOGASTRODUODENOSCOPY (EGD) WITH PROPOFOL;  Surgeon: Otis Brace, MD;  [facility];  Service: Gastroenterology;  Laterality: N/A;  . ESOPHAGOGASTRODUODENOSCOPY (EGD) WITH PROPOFOL N/A 06/29/2018   Procedure: ESOPHAGOGASTRODUODENOSCOPY (EGD) WITH PROPOFOL;  Surgeon: Otis Brace, MD;  [facility];  Service: Gastroenterology;  Laterality: N/A;  . ESOPHAGOGASTRODUODENOSCOPY (EGD) WITH PROPOFOL N/A 09/30/2018   Procedure: ESOPHAGOGASTRODUODENOSCOPY (EGD) WITH PROPOFOL;  Surgeon: Otis Brace, MD;  [facility];  Service: Gastroenterology;  Laterality: N/A;  . ESOPHAGOGASTRODUODENOSCOPY (EGD) WITH PROPOFOL N/A 04/20/2019   Procedure: ESOPHAGOGASTRODUODENOSCOPY (EGD) WITH PROPOFOL;  Surgeon: Otis Brace, MD;  [facility];  Service: Gastroenterology;  Laterality: N/A;  . ESOPHAGOGASTRODUODENOSCOPY (EGD) WITH PROPOFOL N/A 04/17/2020   Procedure: ESOPHAGOGASTRODUODENOSCOPY (EGD) WITH PROPOFOL;  Surgeon: Wonda Horner, MD;  Location: [place];  Service: Gastroenterology;  Laterality: N/A;  . I & D EXTREMITY Left 02/18/2018   Procedure: IRRIGATION AND DEBRIDEMENT LEFT ARM;  Surgeon: Serafina Mitchell, MD;  Location[facility];  Service: Vascular;  Laterality: Left;  . INSERTION OF DIALYSIS CATHETER N/A 01/19/2018   Procedure: INSERTION OF DIALYSIS CATHETER - RIGHT INTERNAL JUGULAR PLACEMENT;  Surgeon: Rosetta Posner, MD;  Location: [place];  Service: Vascular;  Laterality: N/A;  . IR FLUORO GUIDE CV LINE RIGHT  11/10/2017  . IR PARACENTESIS  12/10/2017  . IR PARACENTESIS  12/26/2017  . IR  REMOVAL TUN CV CATH W/O FL  11/28/2017  . IR THROMBECTOMY AV FISTULA W/THROMBOLYSIS/PTA INC/SHUNT/IMG LEFT Left 06/02/2019  . IR US GUIDE VASC ACCESS LEFT  06/02/2019  . IR US GUIDE VASC ACCESS RIGHT  11/10/2017  . NECK SURGERY  2007    "back of neck; no hardware in there"    Family History  Problem Relation Age of Onset  . Diabetes Mellitus II Sister   . Diabetes Sister   . Stroke Brother   . Heart attack Brother     Social History:  reports that he has never smoked. He has never used smokeless tobacco. He reports that he does not drink alcohol and does not use drugs.  Allergies:  Allergies  Allergen Reactions  . Heparin Other (See Comments)    Patient is Muslim and is not permitted Pork derative due to religious belief)  . Pork-Derived Products Other (See Comments)    Patient does not eat pork due to religious beliefs    Medications: I have reviewed the patient's current medications.  Results for orders placed or performed during the hospital encounter of 10/06/20 (from the past 48 hour(s))  CBC with Differential     Status: Abnormal   Collection Time: 10/06/20  6:55 AM  Result Value Ref Range   WBC 3.7 (L) 4.0 - 10.5 K/uL   RBC 4.28 4.22 - 5.81 MIL/uL   Hemoglobin 11.7 (L) 13.0 - 17.0 g/dL   HCT 37.5 (L) 39.0 - 52.0 %   MCV 87.6 80.0 - 100.0 fL   MCH 27.3 26.0 - 34.0 pg   MCHC 31.2 30.0 - 36.0 g/dL   RDW 14.6 11.5 - 15.5 %   Platelets 68 (L) 150 - 400 K/uL    Comment: Immature Platelet Fraction may be clinically indicated, consider ordering this additional test [id number] REPEATED TO VERIFY PLATELET COUNT CONFIRMED BY SMEAR    nRBC 0.0 0.0 - 0.2 %   Neutrophils Relative % 40 %   Neutro Abs 1.5 (L) 1.7 - 7.7 K/uL   Lymphocytes Relative 41 %   Lymphs Abs 1.5 0.7 - 4.0 K/uL   Monocytes Relative 14 %   Monocytes Absolute 0.5 0.1 - 1.0 K/uL   Eosinophils Relative 5 %   Eosinophils Absolute 0.2 0.0 - 0.5 K/uL   Basophils Relative 0 %   Basophils Absolute 0.0 0.0 - 0.1 K/uL   Smear Review MORPHOLOGY UNREMARKABLE    Immature Granulocytes 0 %   Abs Immature Granulocytes 0.01 0.00 - 0.07 K/uL    Comment: Performed at [facility], [address], [place], [place] [address]  Comprehensive  metabolic panel     Status: Abnormal   Collection Time: 10/06/20  6:55 AM  Result Value Ref Range   Sodium 135 135 - 145 mmol/L   Potassium 4.3 3.5 - 5.1 mmol/L   Chloride 94 (L) 98 - 111 mmol/L   CO2 27 22 - 32 mmol/L   Glucose, Bld 82 70 - 99 mg/dL    Comment: Glucose reference range applies only to samples taken after fasting for at least 8 hours.   BUN 32 (H) 8 - 23 mg/dL   Creatinine, Ser 4.93 (H) 0.61 - 1.24 mg/dL   Calcium 8.7 (L) 8.9 - 10.3 mg/dL   Total Protein 6.8 6.5 - 8.1 g/dL   Albumin 2.8 (L) 3.5 - 5.0 g/dL   AST 34 15 - 41 U/L   ALT 16 0 - 44 U/L   Alkaline Phosphatase 160 (H)  38 - 126 U/L   Total Bilirubin 1.5 (H) 0.3 - 1.2 mg/dL   GFR, Estimated 12 (L) >60 mL/min    Comment: (NOTE) Calculated using the CKD-EPI Creatinine Equation (2021)    Anion gap 14 5 - 15    Comment: Performed at [facility] [address] [place], [place] [address]  Resp Panel by RT-PCR (Flu A&B, Covid) Nasopharyngeal Swab     Status: None   Collection Time: 10/06/20  9:38 AM   Specimen: Nasopharyngeal Swab; Nasopharyngeal(NP) swabs in vial transport medium  Result Value Ref Range   SARS Coronavirus 2 by RT PCR NEGATIVE NEGATIVE    Comment: (NOTE) SARS-CoV-2 target nucleic acids are NOT DETECTED.  The SARS-CoV-2 RNA is generally detectable in upper respiratory specimens during the acute phase of infection. The lowest concentration of SARS-CoV-2 viral copies this assay can detect is 138 copies/mL. A negative result does not preclude SARS-Cov-2 infection and should not be used as the sole basis for treatment or other patient management decisions. A negative result may occur with  improper specimen collection/handling, submission of specimen other than nasopharyngeal swab, presence of viral mutation(s) within the areas targeted by this assay, and inadequate number of viral copies(<138 copies/mL). A negative result must be combined with clinical observations, patient history, and  epidemiological information. The expected result is Negative.  Fact Sheet for Patients:  EntrepreneurPulse.com.au  Fact Sheet for Healthcare Providers:  IncredibleEmployment.be  This test is no t yet approved or cleared by the [place] FDA and  has been authorized for detection and/or diagnosis of SARS-CoV-2 by FDA under an Emergency Use Authorization (EUA). This EUA will remain  in effect (meaning this test can be used) for the duration of the COVID-19 declaration under Section 564(b)(1) of the Act, 21 U.S.C.section 360bbb-3(b)(1), unless the authorization is terminated  or revoked sooner.       Influenza A by PCR NEGATIVE NEGATIVE   Influenza B by PCR NEGATIVE NEGATIVE    Comment: (NOTE) The Xpert Xpress SARS-CoV-2/FLU/RSV plus assay is intended as an aid in the diagnosis of influenza from Nasopharyngeal swab specimens and should not be used as a sole basis for treatment. Nasal washings and aspirates are unacceptable for Xpert Xpress SARS-CoV-2/FLU/RSV testing.  Fact Sheet for Patients: EntrepreneurPulse.com.au  Fact Sheet for Healthcare Providers: IncredibleEmployment.be  This test is not yet approved or cleared by the [place] FDA and has been authorized for detection and/or diagnosis of SARS-CoV-2 by FDA under an Emergency Use Authorization (EUA). This EUA will remain in effect (meaning this test can be used) for the duration of the COVID-19 declaration under Section 564(b)(1) of the Act, 21 U.S.C. section 360bbb-3(b)(1), unless the authorization is terminated or revoked.  Performed at [facility], [place] [address] [place], [place] [address]     DG Toe 3rd Left  Result Date: 10/06/2020 CLINICAL DATA:  Third toe pain and known diabetic ulcer EXAM: LEFT THIRD TOE COMPARISON:  04/03/2020 FINDINGS: Soft tissue ulcer is noted along the distal aspect of the third toe. No definitive  bony erosive changes are noted to suggest osteomyelitis. No fracture is noted. IMPRESSION: Soft tissue ulcer in the distal third toe. No underlying bony destructive changes are seen. Electronically Signed   By: Inez Catalina M.D.   On: 10/06/2020 07:12    Review of Systems  Constitutional: Negative for chills and fever.  HENT: Negative.   Respiratory: Negative.   Cardiovascular: Negative.   Gastrointestinal: Negative.   Genitourinary:  Negative.   Musculoskeletal: Negative.   Skin:       Black toe left 3rd   Neurological: Negative.   Psychiatric/Behavioral: Negative.   All other systems reviewed and are negative.  Blood pressure (!) 160/62, pulse 78, temperature 98.1 F (36.7 C), temperature source Oral, resp. rate 17, height 5' 5"  (1.651 m), weight 65 kg, SpO2 100 %.  Vitals:   10/06/20 0931 10/06/20 1426  BP: (!) 159/64 (!) 160/62  Pulse: 81 78  Resp: 17 17  Temp: 98 F (36.7 C) 98.1 F (36.7 C)  SpO2: 100% 100%    General AA&O x3. Normal mood and affect.  Vascular Dorsalis pedis +2 and posterior tibial pulse +1 left Capillary refill normal to all digits. Pedal hair growth diminished.  Neurologic Epicritic sensation grossly present.  Dermatologic (Wound) Wound Location: left 3rd toe gangrene  Orthopedic: Motor intact BLE.      Assessment/Plan:  Gangrene of left 3rd toe -Imaging: Studies independently reviewed.  Do not think necessary to obtain further advanced imaging such as MRI, the soft tissue likely not recover and despite absence of osteomyelitis still recommend amputation. -I do think be prudent to obtain ABIs and PVRs.  I suspect he does have some level of calcific small vessel disease.  He has a strong DP pulse, his PT is less palpable but is present. -Antibiotics: If the primary team is comfortable with this and he remains clinically stable could D/C antibiotics to increase culture yield in OR -WB Status: WBAT LLE in surgical shoe -Wound Care: Betadine paint  for now -Surgical Plan: Amputation of third toe left foot tomorrow 10/07/2020 -N.p.o. past midnight  Criselda Peaches 10/06/2020, 3:59 PM   Best available via secure chat for questions or concerns.

## 2020-10-06 NOTE — Telephone Encounter (Signed)
Thank you :)

## 2020-10-06 NOTE — ED Notes (Signed)
Ordered pt meal tray

## 2020-10-06 NOTE — Telephone Encounter (Signed)
William [name] has called back again from [facility] for [facility] consult. Please Advise

## 2020-10-06 NOTE — ED Triage Notes (Signed)
Patient reports worsening left 3rd toe infection /necrosis onset last week . Denies fever or chills .

## 2020-10-06 NOTE — Progress Notes (Signed)
Pharmacy Antibiotic Note  William [name] is a 70 y.o. male admitted on 10/06/2020 with a necrotic toe infection. On 09/27/20 his podiatrist initiated doxycycline and he was on day 9/10 before admission. Patient has ESRD on HD on T/Th/Sat, unsure of last dialysis appointment. Pharmacy has been consulted for Vancomycin and Zosyn dosing.  Plan: Vancomycin 1500 mg IV x1 Vancomycin 750 mg IV following dialysis Zosyn 3.375 g IV over 30 mins load Zosyn 2.25 g IV q8 hours Monitor plan for dialysis and surgical intervention, cultures, clinical improvement and LOT/de-escalation plan.  Height: 5' 5"  (165.1 cm) Weight: 65 kg (143 lb 4.8 oz) IBW/kg (Calculated) : 61.5  Temp (24hrs), Avg:98.1 F (36.7 C), Min:98 F (36.7 C), Max:98.2 F (36.8 C)  Recent Labs  Lab 10/06/20 0655  WBC 3.7*  CREATININE 4.93*    Estimated Creatinine Clearance: 12.3 mL/min (A) (by C-G formula based on SCr of 4.93 mg/dL (H)).    Allergies  Allergen Reactions  . Heparin Other (See Comments)    Patient is Muslim and is not permitted Pork derative due to religious belief)  . Pork-Derived Products Other (See Comments)    Patient does not eat pork due to religious beliefs    Antimicrobials this admission: Vancomycin 3/4 >>  Zosyn 3/4 >>   Dose adjustments this admission: Doses have been adjusted for hemodialysis  Microbiology results: 3/4 BCx: pending  Thank you for allowing pharmacy to be a part of this patient's care.  Jacobo Forest PharmD Candidate 2022 10/06/2020 9:45 AM

## 2020-10-06 NOTE — Telephone Encounter (Signed)
Physician Assistant Theadore Nan [phone] from [facility] called and stated that patient seen Dr. Paulla Dolly on 09/27/2020 and was started on antibiotics and he is on day nine. Toe has worsened and is turning black.

## 2020-10-06 NOTE — Consult Note (Addendum)
[facility] Renal Consultation Note  Indication for Consultation:  Management of ESRD/; anemia, hypertension/volume and secondary hyperparathyroidism  HPI: William [name] is a 70 y.o. male with ESRD(Goodpasture's DZ) on chronic HD TTS ([facility])hx of cirrhosis,secondary to NAFLD with gastric varices /pancytopenia, HTN, DM2 presented to ED after patient and son noted left middle toe was turning black, previously seen 02/20 by podiatry for toenail infection ("clipped too close") Seen in the ER with son acting as interpreter, denied fever chills shortness of breath N, V, D, chest pain, leg pain, has not missed any outpatient dialysis.  In the ER lab data BP 177/75, WBC 3.7, Hgb 11 .7, PLT 68(appears as chronic) x-ray of left toe showing soft tissue ulcer no underlying bony destructive changes seen.  He is admitted started by placing Zosyn with podiatry consult.  He is stable for next dialysis tomorrow.  When I see him his son is not there-  He is eating a meal try and happy, pain free-  Podiatry indicates will amputate the toe tomorrow       Past Medical History:  Diagnosis Date  . Anemia   . Blind one eye   . CHF (congestive heart failure) ([place])   . Cirrhosis ([place])   . ESRD on hemodialysis ([place])   . Goodpasture's disease [facility])    on outpatient plasmapheresis/notes 11/20/2017  . Grade I diastolic dysfunction 01/77/9390  . History of anemia due to chronic kidney disease   . History of blood transfusion X 1   "UGIB; low blood count"  . History of plasmapheresis    "qod" (11/20/2017)  . Hypertension   . Pancytopenia ([place]) 08/20/2017  . Type II diabetes mellitus ([place])     Past Surgical History:  Procedure Laterality Date  . AV FISTULA PLACEMENT Left 12/26/2017   Procedure: LEFT BRACHIOCEPHALIC ARTERIOVENOUS (AV) FISTULA CREATION;  Surgeon: Conrad Snead, MD;  Location: [place];  Service: Vascular;  Laterality: Left;  . BASCILIC VEIN TRANSPOSITION Left 02/16/2018    Procedure: BRACHIOBASILIC VEIN TRANSPOSITION SECOND STAGE;  Surgeon: Conrad Aripeka, MD;  Location: [place];  Service: Vascular;  Laterality: Left;  . CATARACT EXTRACTION W/ INTRAOCULAR LENS  IMPLANT, BILATERAL Bilateral   . ESOPHAGEAL BANDING N/A 04/01/2018   Procedure: ESOPHAGEAL BANDING;  Surgeon: Otis Brace, MD;  [facility]: [facility];  Service: Gastroenterology;  Laterality: N/A;  . ESOPHAGEAL BANDING N/A 06/29/2018   Procedure: ESOPHAGEAL BANDING;  Surgeon: Otis Brace, MD;  [facility]: [facility];  Service: Gastroenterology;  Laterality: N/A;  . ESOPHAGOGASTRODUODENOSCOPY (EGD) WITH PROPOFOL N/A 04/01/2018   Procedure: ESOPHAGOGASTRODUODENOSCOPY (EGD) WITH PROPOFOL;  Surgeon: Otis Brace, MD;  [facility];  Service: Gastroenterology;  Laterality: N/A;  . ESOPHAGOGASTRODUODENOSCOPY (EGD) WITH PROPOFOL N/A 06/29/2018   Procedure: ESOPHAGOGASTRODUODENOSCOPY (EGD) WITH PROPOFOL;  Surgeon: Otis Brace, MD;  [facility];  Service: Gastroenterology;  Laterality: N/A;  . ESOPHAGOGASTRODUODENOSCOPY (EGD) WITH PROPOFOL N/A 09/30/2018   Procedure: ESOPHAGOGASTRODUODENOSCOPY (EGD) WITH PROPOFOL;  Surgeon: Otis Brace, MD;  [facility];  Service: Gastroenterology;  Laterality: N/A;  . ESOPHAGOGASTRODUODENOSCOPY (EGD) WITH PROPOFOL N/A 04/20/2019   Procedure: ESOPHAGOGASTRODUODENOSCOPY (EGD) WITH PROPOFOL;  Surgeon: Otis Brace, MD;  [facility];  Service: Gastroenterology;  Laterality: N/A;  . ESOPHAGOGASTRODUODENOSCOPY (EGD) WITH PROPOFOL N/A 04/17/2020   Procedure: ESOPHAGOGASTRODUODENOSCOPY (EGD) WITH PROPOFOL;  Surgeon: Wonda Horner, MD;  Location: [place];  Service: Gastroenterology;  Laterality: N/A;  . I & D EXTREMITY Left 02/18/2018   Procedure: IRRIGATION AND DEBRIDEMENT LEFT ARM;  Surgeon:  Serafina Mitchell, MD;  Location: [facility] OR;  Service: Vascular;  Laterality: Left;  . INSERTION OF DIALYSIS CATHETER N/A 01/19/2018    Procedure: INSERTION OF DIALYSIS CATHETER - RIGHT INTERNAL JUGULAR PLACEMENT;  Surgeon: Rosetta Posner, MD;  Location: [place];  Service: Vascular;  Laterality: N/A;  . IR FLUORO GUIDE CV LINE RIGHT  11/10/2017  . IR PARACENTESIS  12/10/2017  . IR PARACENTESIS  12/26/2017  . IR REMOVAL TUN CV CATH W/O FL  11/28/2017  . IR THROMBECTOMY AV FISTULA W/THROMBOLYSIS/PTA INC/SHUNT/IMG LEFT Left 06/02/2019  . IR US GUIDE VASC ACCESS LEFT  06/02/2019  . IR US GUIDE VASC ACCESS RIGHT  11/10/2017  . NECK SURGERY  2007   "back of neck; no hardware in there"      Family History  Problem Relation Age of Onset  . Diabetes Mellitus II Sister   . Diabetes Sister   . Stroke Brother   . Heart attack Brother       reports that he has never smoked. He has never used smokeless tobacco. He reports that he does not drink alcohol and does not use drugs.   Allergies  Allergen Reactions  . Heparin Other (See Comments)    Patient is Muslim and is not permitted Pork derative due to religious belief)  . Pork-Derived Products Other (See Comments)    Patient does not eat pork due to religious beliefs    Prior to Admission medications   Medication Sig Start Date End Date Taking? Authorizing Provider  calcium acetate (PHOSLO) 667 MG capsule Take 1 capsule (667 mg total) by mouth 3 (three) times daily with meals. 05/11/20  Yes Aline August, MD  doxycycline (VIBRA-TABS) 100 MG tablet Take 1 tablet (100 mg total) by mouth 2 (two) times daily. 09/27/20  Yes Regal, Tamala Fothergill, DPM  insulin degludec (TRESIBA FLEXTOUCH) 100 UNIT/ML FlexTouch Pen Inject 14 Units into the skin daily. Inject 14 units under the skin once daily. NO FURTHER REFILLS UNTIL PATIENT IS SEEN IN THE OFFICE Patient taking differently: Inject 12 Units into the skin daily after breakfast. NO FURTHER REFILLS UNTIL PATIENT IS SEEN IN THE OFFICE 09/04/20  Yes Elayne Snare, MD  insulin regular (NOVOLIN R,HUMULIN R) 100 units/mL injection Inject 6-8 Units into the  skin See admin instructions. Inject 8 units subcutaneously with breakfast, and 6 units with lunch and supper   Yes [provider]  lactulose (CHRONULAC) 10 GM/15ML solution Take 45 mLs (30 g total) by mouth 3 (three) times daily. 05/11/20  Yes Aline August, MD  lidocaine-prilocaine (EMLA) cream Apply 1 application topically See admin instructions. Apply topically prior to dialysis on Tuesday, Thursday, Saturday 08/24/18  Yes [provider]  Multiple Vitamin (MULTIVITAMIN WITH MINERALS) TABS tablet Take 1 tablet by mouth daily.   Yes [provider]  ondansetron (ZOFRAN ODT) 4 MG disintegrating tablet Take 1 tablet (4 mg total) by mouth 2 (two) times daily as needed for nausea or vomiting. 05/01/20  Yes [name], Hallie C, PA-C  pantoprazole (PROTONIX) 40 MG tablet Take 1 tablet (40 mg total) by mouth 2 (two) times daily. 05/04/20  Yes Domenic Polite, MD  rifaximin (XIFAXAN) 550 MG TABS tablet Take 1 tablet (550 mg total) by mouth 2 (two) times daily. 07/07/20  Yes Shawna Clamp, MD  rosuvastatin (CRESTOR) 5 MG tablet Take 1 tablet by mouth once daily Patient taking differently: Take 5 mg by mouth daily. 04/25/20  Yes Elayne Snare, MD  sevelamer carbonate (RENVELA) 800 MG tablet  Take 800 mg by mouth 3 (three) times daily. 09/25/20  Yes [provider]  glucose blood (ONETOUCH VERIO) test strip USE 1 STRIP TO CHECK GLUCOSE THREE TIMES DAILY. DX:E11.65 01/31/20   Elayne Snare, MD  Insulin Pen Needle (ADVOCATE INSULIN PEN NEEDLES) 31G X 5 MM MISC Use four times daily to inject insulin 04/27/18   Elayne Snare, MD  Methoxy PEG-Epoetin Beta (MIRCERA IJ) 1 Dose See admin instructions. Dialysis gives this medication 05/09/20 05/08/21  [provider]  ONE TOUCH LANCETS MISC Use to test blood sugar three times daily 04/27/18   Elayne Snare, MD     Anti-infectives (From admission, onward)   Start     Dose/Rate Route Frequency Ordered Stop   10/07/20 1400   piperacillin-tazobactam (ZOSYN) IVPB 2.25 g  Status:  Discontinued        2.25 g 100 mL/hr over 30 Minutes Intravenous Every 8 hours 10/06/20 1358 10/06/20 1406   10/07/20 1200  vancomycin (VANCOREADY) IVPB 750 mg/150 mL  Status:  Discontinued        750 mg 150 mL/hr over 60 Minutes Intravenous Every T-Th-Sa (Hemodialysis) 10/06/20 0939 10/06/20 1406   10/06/20 1600  piperacillin-tazobactam (ZOSYN) IVPB 2.25 g  Status:  Discontinued        2.25 g 100 mL/hr over 30 Minutes Intravenous Every 8 hours 10/06/20 0939 10/06/20 1358   10/06/20 1100  rifaximin (XIFAXAN) tablet 550 mg        550 mg Oral 2 times daily 10/06/20 1025     10/06/20 0945  piperacillin-tazobactam (ZOSYN) IVPB 3.375 g        3.375 g 100 mL/hr over 30 Minutes Intravenous  Once 10/06/20 0939 10/06/20 1144   10/06/20 0945  vancomycin (VANCOREADY) IVPB 1500 mg/300 mL        1,500 mg 150 mL/hr over 120 Minutes Intravenous  Once 10/06/20 0939 10/06/20 1413      Results for orders placed or performed during the hospital encounter of 10/06/20 (from the past 48 hour(s))  CBC with Differential     Status: Abnormal   Collection Time: 10/06/20  6:55 AM  Result Value Ref Range   WBC 3.7 (L) 4.0 - 10.5 K/uL   RBC 4.28 4.22 - 5.81 MIL/uL   Hemoglobin 11.7 (L) 13.0 - 17.0 g/dL   HCT 37.5 (L) 39.0 - 52.0 %   MCV 87.6 80.0 - 100.0 fL   MCH 27.3 26.0 - 34.0 pg   MCHC 31.2 30.0 - 36.0 g/dL   RDW 14.6 11.5 - 15.5 %   Platelets 68 (L) 150 - 400 K/uL    Comment: Immature Platelet Fraction may be clinically indicated, consider ordering this additional test [id number] REPEATED TO VERIFY PLATELET COUNT CONFIRMED BY SMEAR    nRBC 0.0 0.0 - 0.2 %   Neutrophils Relative % 40 %   Neutro Abs 1.5 (L) 1.7 - 7.7 K/uL   Lymphocytes Relative 41 %   Lymphs Abs 1.5 0.7 - 4.0 K/uL   Monocytes Relative 14 %   Monocytes Absolute 0.5 0.1 - 1.0 K/uL   Eosinophils Relative 5 %   Eosinophils Absolute 0.2 0.0 - 0.5 K/uL   Basophils Relative 0 %    Basophils Absolute 0.0 0.0 - 0.1 K/uL   Smear Review MORPHOLOGY UNREMARKABLE    Immature Granulocytes 0 %   Abs Immature Granulocytes 0.01 0.00 - 0.07 K/uL    Comment: Performed at [facility], [address] [place], [place] [address]  Comprehensive metabolic  panel     Status: Abnormal   Collection Time: 10/06/20  6:55 AM  Result Value Ref Range   Sodium 135 135 - 145 mmol/L   Potassium 4.3 3.5 - 5.1 mmol/L   Chloride 94 (L) 98 - 111 mmol/L   CO2 27 22 - 32 mmol/L   Glucose, Bld 82 70 - 99 mg/dL    Comment: Glucose reference range applies only to samples taken after fasting for at least 8 hours.   BUN 32 (H) 8 - 23 mg/dL   Creatinine, Ser 4.93 (H) 0.61 - 1.24 mg/dL   Calcium 8.7 (L) 8.9 - 10.3 mg/dL   Total Protein 6.8 6.5 - 8.1 g/dL   Albumin 2.8 (L) 3.5 - 5.0 g/dL   AST 34 15 - 41 U/L   ALT 16 0 - 44 U/L   Alkaline Phosphatase 160 (H) 38 - 126 U/L   Total Bilirubin 1.5 (H) 0.3 - 1.2 mg/dL   GFR, Estimated 12 (L) >60 mL/min    Comment: (NOTE) Calculated using the CKD-EPI Creatinine Equation (2021)    Anion gap 14 5 - 15    Comment: Performed at [facility], [place] [address], [place], [place] [address]  Resp Panel by RT-PCR (Flu A&B, Covid) Nasopharyngeal Swab     Status: None   Collection Time: 10/06/20  9:38 AM   Specimen: Nasopharyngeal Swab; Nasopharyngeal(NP) swabs in vial transport medium  Result Value Ref Range   SARS Coronavirus 2 by RT PCR NEGATIVE NEGATIVE    Comment: (NOTE) SARS-CoV-2 target nucleic acids are NOT DETECTED.  The SARS-CoV-2 RNA is generally detectable in upper respiratory specimens during the acute phase of infection. The lowest concentration of SARS-CoV-2 viral copies this assay can detect is 138 copies/mL. A negative result does not preclude SARS-Cov-2 infection and should not be used as the sole basis for treatment or other patient management decisions. A negative result may occur with  improper specimen collection/handling,  submission of specimen other than nasopharyngeal swab, presence of viral mutation(s) within the areas targeted by this assay, and inadequate number of viral copies(<138 copies/mL). A negative result must be combined with clinical observations, patient history, and epidemiological information. The expected result is Negative.  Fact Sheet for Patients:  EntrepreneurPulse.com.au  Fact Sheet for Healthcare Providers:  IncredibleEmployment.be  This test is no t yet approved or cleared by the [place] FDA and  has been authorized for detection and/or diagnosis of SARS-CoV-2 by FDA under an Emergency Use Authorization (EUA). This EUA will remain  in effect (meaning this test can be used) for the duration of the COVID-19 declaration under Section 564(b)(1) of the Act, 21 U.S.C.section 360bbb-3(b)(1), unless the authorization is terminated  or revoked sooner.       Influenza A by PCR NEGATIVE NEGATIVE   Influenza B by PCR NEGATIVE NEGATIVE    Comment: (NOTE) The Xpert Xpress SARS-CoV-2/FLU/RSV plus assay is intended as an aid in the diagnosis of influenza from Nasopharyngeal swab specimens and should not be used as a sole basis for treatment. Nasal washings and aspirates are unacceptable for Xpert Xpress SARS-CoV-2/FLU/RSV testing.  Fact Sheet for Patients: EntrepreneurPulse.com.au  Fact Sheet for Healthcare Providers: IncredibleEmployment.be  This test is not yet approved or cleared by the [place] FDA and has been authorized for detection and/or diagnosis of SARS-CoV-2 by FDA under an Emergency Use Authorization (EUA). This EUA will remain in effect (meaning this test can be used) for the duration of the COVID-19 declaration under  Section 564(b)(1) of the Act, 21 U.S.C. section 360bbb-3(b)(1), unless the authorization is terminated or revoked.  Performed at [facility], [place] [address],  [place], [place] [address]     ROS: See HPI  Physical Exam: Vitals:   10/06/20 0931 10/06/20 1426  BP: (!) 159/64 (!) 160/62  Pulse: 81 78  Resp: 17 17  Temp: 98 F (36.7 C) 98.1 F (36.7 C)  SpO2: 100% 100%     General: Alert, oriented WN WD male appears stated age, NAD HEENT: [place], PERRLA, EOMI, sclera nonicteric, MMM Neck: No JVD, supple Heart: RRR, 2/6 murmur, no rub or gallop Lungs: CTA, nonlabored breathing Abdomen: BS+, normoactive, soft, ND NT Extremities: Trace pedal edema on the right, and left lower extremity with black necrotic dry gangrenous appearance of left middle toe Skin: No overt rash, left foot warm to touch with left middle toe Skin as above Neuro: Alert O x3 no acute focal deficits was able to walk to the restroom independently noted, no acute focal deficits appreciated Dialysis Access: Positive bruit left upper arm AV fistula  Dialysis Orders: [facility]: [facility]  , TTS 4 hours. EDW 3.5 kg, 2K, 2 CA bath   HD Bath   No Heparin  Left UA AVF.  HEC 1 mcg IV/HD  Mircera 60 MCG q 2 weeks last given 10/03/20  Assessment/Plan 1. Left Third  Toe necrotic wound= work-up per admit team treated consult, noted vancomycin and Zosyn ordered ABIs.  For amputation tomorrow per podiatry 2. ESRD -HD TTS schedule, no volume issues or lab on admit.  Will plan for HD tomorrow after surgery most likely   3. Hypertension/volume  -no volume overload on admission, BP minimal elevated, no home BP meds use amlodipine 10 mg if SBP continues to stay over 150, follow-up BP trend UF with HD tomorrow 4. Anemia of ESRD-Hgb 11.7 , ESA given 3/01, follow-up trend 5. metabolic bone disease -continue calcium acetate binders with meals and vitamin D, follow-up calcium phosphorus trend 6. Nutrition -renal/carbohydrate modified diet, add Nepro protein supplement Albumin 2.8 7. Diabetes mellitus type 2- per admit  [name], PA-C [place] [phone] 10/06/2020, 2:29 PM    Patient seen and examined, agree with above note with above modifications. Pt known to me -  ESRD-  HD TTS, is compliant patient.  Also with cirrhosis and DM.  Came to ER with necrotic left third toe-  For amputation of said toe tomorrow.  We will provide routine HD also tomorrow and give him any dialysis related medications  [name], MD 10/06/2020

## 2020-10-07 ENCOUNTER — Inpatient Hospital Stay (HOSPITAL_COMMUNITY): Payer: Medicare Other | Admitting: Anesthesiology

## 2020-10-07 ENCOUNTER — Encounter (HOSPITAL_COMMUNITY)
Admission: EM | Disposition: A | Discharge status: Home / Self Care | Payer: Self-pay | Source: Home / Self Care | Attending: Student in an Organized Health Care Education/Training Program

## 2020-10-07 ENCOUNTER — Inpatient Hospital Stay (HOSPITAL_COMMUNITY): Payer: Medicare Other

## 2020-10-07 DIAGNOSIS — K7469 Other cirrhosis of liver: Secondary | ICD-10-CM | POA: Diagnosis not present

## 2020-10-07 DIAGNOSIS — I96 Gangrene, not elsewhere classified: Secondary | ICD-10-CM

## 2020-10-07 DIAGNOSIS — L089 Local infection of the skin and subcutaneous tissue, unspecified: Secondary | ICD-10-CM | POA: Diagnosis not present

## 2020-10-07 DIAGNOSIS — N186 End stage renal disease: Secondary | ICD-10-CM | POA: Diagnosis not present

## 2020-10-07 DIAGNOSIS — R188 Other ascites: Secondary | ICD-10-CM | POA: Diagnosis not present

## 2020-10-07 DIAGNOSIS — E1152 Type 2 diabetes mellitus with diabetic peripheral angiopathy with gangrene: Secondary | ICD-10-CM | POA: Diagnosis not present

## 2020-10-07 DIAGNOSIS — D61818 Other pancytopenia: Secondary | ICD-10-CM | POA: Diagnosis not present

## 2020-10-07 DIAGNOSIS — E11628 Type 2 diabetes mellitus with other skin complications: Secondary | ICD-10-CM | POA: Diagnosis not present

## 2020-10-07 HISTORY — PX: AMPUTATION TOE: SHX6595

## 2020-10-07 LAB — CBC
HCT: 31.1 % — ABNORMAL LOW (ref 39.0–52.0)
Hemoglobin: 9.9 g/dL — ABNORMAL LOW (ref 13.0–17.0)
MCH: 27.3 pg (ref 26.0–34.0)
MCHC: 31.8 g/dL (ref 30.0–36.0)
MCV: 85.9 fL (ref 80.0–100.0)
Platelets: 55 10*3/uL — ABNORMAL LOW (ref 150–400)
RBC: 3.62 MIL/uL — ABNORMAL LOW (ref 4.22–5.81)
RDW: 14.4 % (ref 11.5–15.5)
WBC: 2.5 10*3/uL — ABNORMAL LOW (ref 4.0–10.5)
nRBC: 0 % (ref 0.0–0.2)

## 2020-10-07 LAB — APTT: aPTT: 38 seconds — ABNORMAL HIGH (ref 24–36)

## 2020-10-07 LAB — COMPREHENSIVE METABOLIC PANEL
ALT: 17 U/L (ref 0–44)
AST: 29 U/L (ref 15–41)
Albumin: 2.3 g/dL — ABNORMAL LOW (ref 3.5–5.0)
Alkaline Phosphatase: 133 U/L — ABNORMAL HIGH (ref 38–126)
Anion gap: 16 — ABNORMAL HIGH (ref 5–15)
BUN: 48 mg/dL — ABNORMAL HIGH (ref 8–23)
CO2: 23 mmol/L (ref 22–32)
Calcium: 8.4 mg/dL — ABNORMAL LOW (ref 8.9–10.3)
Chloride: 93 mmol/L — ABNORMAL LOW (ref 98–111)
Creatinine, Ser: 6.07 mg/dL — ABNORMAL HIGH (ref 0.61–1.24)
GFR, Estimated: 9 mL/min — ABNORMAL LOW (ref >60)
Glucose, Bld: 482 mg/dL — ABNORMAL HIGH (ref 70–99)
Potassium: 5.3 mmol/L — ABNORMAL HIGH (ref 3.5–5.1)
Sodium: 132 mmol/L — ABNORMAL LOW (ref 135–145)
Total Bilirubin: 1.6 mg/dL — ABNORMAL HIGH (ref 0.3–1.2)
Total Protein: 5.7 g/dL — ABNORMAL LOW (ref 6.5–8.1)

## 2020-10-07 LAB — PROTIME-INR
INR: 1.3 — ABNORMAL HIGH (ref 0.8–1.2)
Prothrombin Time: 16.1 seconds — ABNORMAL HIGH (ref 11.4–15.2)

## 2020-10-07 LAB — SURGICAL PCR SCREEN
MRSA, PCR: NEGATIVE
Staphylococcus aureus: NEGATIVE

## 2020-10-07 LAB — GLUCOSE, CAPILLARY
Glucose-Capillary: 312 mg/dL — ABNORMAL HIGH (ref 70–99)
Glucose-Capillary: 303 mg/dL — ABNORMAL HIGH (ref 70–99)
Glucose-Capillary: 238 mg/dL — ABNORMAL HIGH (ref 70–99)
Glucose-Capillary: 184 mg/dL — ABNORMAL HIGH (ref 70–99)

## 2020-10-07 SURGERY — AMPUTATION, TOE
Anesthesia: Monitor Anesthesia Care | Site: Toe | Laterality: Left

## 2020-10-07 MED ORDER — PHENYLEPHRINE 40 MCG/ML (10ML) SYRINGE FOR IV PUSH (FOR BLOOD PRESSURE SUPPORT)
PREFILLED_SYRINGE | INTRAVENOUS | Status: AC
  Filled 2020-10-07: qty 10

## 2020-10-07 MED ORDER — PROPOFOL 500 MG/50ML IV EMUL
INTRAVENOUS | Status: DC | PRN
  Administered 2020-10-07: 100 ug/kg/min via INTRAVENOUS

## 2020-10-07 MED ORDER — INSULIN ASPART 100 UNIT/ML ~~LOC~~ SOLN: 3.0000 [IU] | Freq: Once | SUBCUTANEOUS | Status: AC

## 2020-10-07 MED ORDER — MIDAZOLAM HCL 2 MG/2ML IJ SOLN
INTRAMUSCULAR | Status: AC
  Filled 2020-10-07: qty 2

## 2020-10-07 MED ORDER — BUPIVACAINE HCL 0.5 % IJ SOLN
INTRAMUSCULAR | Status: AC
  Filled 2020-10-07: qty 1

## 2020-10-07 MED ORDER — DOXERCALCIFEROL 4 MCG/2ML IV SOLN
INTRAVENOUS | Status: AC
  Filled 2020-10-07: qty 2

## 2020-10-07 MED ORDER — LIDOCAINE 2% (20 MG/ML) 5 ML SYRINGE
INTRAMUSCULAR | Status: DC | PRN
  Administered 2020-10-07: 40 mg via INTRAVENOUS

## 2020-10-07 MED ORDER — AMOXICILLIN-POT CLAVULANATE 875-125 MG PO TABS: 1.0000 | ORAL_TABLET | Freq: Two times a day (BID) | ORAL | 0 refills | Status: AC

## 2020-10-07 MED ORDER — ONDANSETRON HCL 4 MG/2ML IJ SOLN
INTRAMUSCULAR | Status: AC
  Filled 2020-10-07: qty 2

## 2020-10-07 MED ORDER — SODIUM CHLORIDE 0.9 % IV SOLN: INTRAVENOUS | Status: DC

## 2020-10-07 MED ORDER — 0.9 % SODIUM CHLORIDE (POUR BTL) OPTIME
TOPICAL | Status: DC | PRN
  Administered 2020-10-07: 1000 mL

## 2020-10-07 MED ORDER — PROPOFOL 10 MG/ML IV BOLUS
INTRAVENOUS | Status: AC
  Filled 2020-10-07: qty 20

## 2020-10-07 MED ORDER — PHENYLEPHRINE 40 MCG/ML (10ML) SYRINGE FOR IV PUSH (FOR BLOOD PRESSURE SUPPORT)
PREFILLED_SYRINGE | INTRAVENOUS | Status: DC | PRN
  Administered 2020-10-07 (×2): 80 ug via INTRAVENOUS

## 2020-10-07 MED ORDER — CHLORHEXIDINE GLUCONATE 0.12 % MT SOLN
OROMUCOSAL | Status: AC
  Administered 2020-10-07: 15 mL
  Filled 2020-10-07: qty 15

## 2020-10-07 MED ORDER — ROCURONIUM BROMIDE 10 MG/ML (PF) SYRINGE
PREFILLED_SYRINGE | INTRAVENOUS | Status: AC
  Filled 2020-10-07: qty 10

## 2020-10-07 MED ORDER — HYDROMORPHONE HCL 2 MG PO TABS: 2.0000 mg | ORAL_TABLET | ORAL | 0 refills | Status: DC | PRN

## 2020-10-07 MED ORDER — AMOXICILLIN-POT CLAVULANATE 875-125 MG PO TABS: 1.0000 | ORAL_TABLET | Freq: Two times a day (BID) | ORAL | 0 refills | Status: DC

## 2020-10-07 MED ORDER — LIDOCAINE 2% (20 MG/ML) 5 ML SYRINGE
INTRAMUSCULAR | Status: AC
  Filled 2020-10-07: qty 5

## 2020-10-07 MED ORDER — FENTANYL CITRATE (PF) 250 MCG/5ML IJ SOLN
INTRAMUSCULAR | Status: AC
  Filled 2020-10-07: qty 5

## 2020-10-07 MED ORDER — HYDROMORPHONE HCL 2 MG PO TABS: 2.0000 mg | ORAL_TABLET | ORAL | 0 refills | Status: AC | PRN

## 2020-10-07 MED ORDER — FENTANYL CITRATE (PF) 250 MCG/5ML IJ SOLN
INTRAMUSCULAR | Status: DC | PRN
  Administered 2020-10-07: 50 ug via INTRAVENOUS

## 2020-10-07 MED ORDER — BUPIVACAINE HCL 0.5 % IJ SOLN
INTRAMUSCULAR | Status: DC | PRN
  Administered 2020-10-07: 10 mL

## 2020-10-07 MED ORDER — INSULIN ASPART 100 UNIT/ML ~~LOC~~ SOLN
SUBCUTANEOUS | Status: AC
  Administered 2020-10-07: 3 [IU] via SUBCUTANEOUS
  Filled 2020-10-07: qty 1

## 2020-10-07 MED ORDER — VANCOMYCIN HCL 500 MG IV SOLR
INTRAVENOUS | Status: AC
  Filled 2020-10-07: qty 500

## 2020-10-07 MED ORDER — DEXAMETHASONE SODIUM PHOSPHATE 10 MG/ML IJ SOLN
INTRAMUSCULAR | Status: AC
  Filled 2020-10-07: qty 1

## 2020-10-07 SURGICAL SUPPLY — 33 items
BLADE LONG MED 31X9 (MISCELLANEOUS) IMPLANT
BNDG ELASTIC 4X5.8 VLCR STR LF (GAUZE/BANDAGES/DRESSINGS) ×1 IMPLANT
BNDG GAUZE ELAST 4 BULKY (GAUZE/BANDAGES/DRESSINGS) ×1 IMPLANT
COVER SURGICAL LIGHT HANDLE (MISCELLANEOUS) ×2 IMPLANT
CUFF TOURN SGL QUICK 24 (TOURNIQUET CUFF)
CUFF TRNQT CYL 24X4X16.5-23 (TOURNIQUET CUFF) IMPLANT
DRAPE SURG 17X23 STRL (DRAPES) ×2 IMPLANT
DRSG XEROFORM 1X8 (GAUZE/BANDAGES/DRESSINGS) ×1 IMPLANT
GAUZE SPONGE 2X2 8PLY STRL LF (GAUZE/BANDAGES/DRESSINGS) IMPLANT
GAUZE SPONGE 4X4 12PLY STRL (GAUZE/BANDAGES/DRESSINGS) ×1 IMPLANT
GAUZE XEROFORM 1X8 LF (GAUZE/BANDAGES/DRESSINGS) IMPLANT
GLOVE BIOGEL M 7.0 STRL (GLOVE) ×2 IMPLANT
GLOVE BIOGEL PI ORTHO PRO 7.5 (GLOVE) ×1
GLOVE PI ORTHO PRO STRL 7.5 (GLOVE) ×1 IMPLANT
GOWN STRL REUS W/ TWL LRG LVL3 (GOWN DISPOSABLE) ×2 IMPLANT
GOWN STRL REUS W/TWL LRG LVL3 (GOWN DISPOSABLE) ×4
KIT BASIN OR (CUSTOM PROCEDURE TRAY) ×2 IMPLANT
KIT TURNOVER KIT B (KITS) ×2 IMPLANT
NDL HYPO 25GX1X1/2 BEV (NEEDLE) IMPLANT
NEEDLE HYPO 25GX1X1/2 BEV (NEEDLE) ×2 IMPLANT
NS IRRIG 1000ML POUR BTL (IV SOLUTION) ×2 IMPLANT
PACK ORTHO EXTREMITY (CUSTOM PROCEDURE TRAY) ×2 IMPLANT
PAD ARMBOARD 7.5X6 YLW CONV (MISCELLANEOUS) ×4 IMPLANT
SOL PREP POV-IOD 4OZ 10% (MISCELLANEOUS) ×5 IMPLANT
SPECIMEN JAR SMALL (MISCELLANEOUS) ×3 IMPLANT
SPONGE GAUZE 2X2 STER 10/PKG (GAUZE/BANDAGES/DRESSINGS)
SUT ETHILON 3 0 PS 1 (SUTURE) ×2 IMPLANT
SYR CONTROL 10ML LL (SYRINGE) ×1 IMPLANT
TOWEL GREEN STERILE (TOWEL DISPOSABLE) ×2 IMPLANT
TOWEL GREEN STERILE FF (TOWEL DISPOSABLE) ×2 IMPLANT
TUBE CONNECTING 12X1/4 (SUCTIONS) IMPLANT
UNDERPAD 30X36 HEAVY ABSORB (UNDERPADS AND DIAPERS) ×2 IMPLANT
WATER STERILE IRR 1000ML POUR (IV SOLUTION) ×1 IMPLANT

## 2020-10-07 NOTE — Anesthesia Preprocedure Evaluation (Addendum)
Anesthesia Evaluation  Patient identified by MRN, date of birth, ID band Patient awake    Reviewed: Allergy & Precautions, NPO status , Patient's Chart, lab work & pertinent test results  Airway Mallampati: III  TM Distance: >3 FB Neck ROM: Full    Dental  (+) Edentulous Upper, Missing   Pulmonary neg pulmonary ROS,    Pulmonary exam normal breath sounds clear to auscultation       Cardiovascular hypertension, +CHF  Normal cardiovascular exam Rhythm:Regular Rate:Normal     Neuro/Psych negative neurological ROS  negative psych ROS   GI/Hepatic negative GI ROS, (+) Cirrhosis       ,   Endo/Other  diabetes, Insulin Dependent  Renal/GU ESRF and DialysisRenal diseaseOn HD T, R, Sat     Musculoskeletal negative musculoskeletal ROS (+)   Abdominal   Peds  Hematology  (+) Blood dyscrasia, anemia , HLD Thronbocytopenia   Anesthesia Other Findings GANGRENOUS TOE  Reproductive/Obstetrics                            Anesthesia Physical Anesthesia Plan  ASA: IV  Anesthesia Plan: MAC   Post-op Pain Management:    Induction: Intravenous  PONV Risk Score and Plan: 1 and Ondansetron, Dexamethasone, Propofol infusion and Treatment may vary due to age or medical condition  Airway Management Planned: Simple Face Mask  Additional Equipment:   Intra-op Plan:   Post-operative Plan:   Informed Consent: I have reviewed the patients History and Physical, chart, labs and discussed the procedure including the risks, benefits and alternatives for the proposed anesthesia with the patient or authorized representative who has indicated his/her understanding and acceptance.     Interpreter used for [employer] and Engineer, production given  Plan Discussed with: CRNA  Anesthesia Plan Comments:         Anesthesia Quick Evaluation

## 2020-10-07 NOTE — Progress Notes (Signed)
Video interpreter [phone] for preop interview.

## 2020-10-07 NOTE — Progress Notes (Signed)
Orthopedic Tech Progress Note Patient Details:  William [name] Jul 04, 1951 [id number]  Ortho Devices Type of Ortho Device: Postop shoe/boot Ortho Device/Splint Location: LLE Ortho Device/Splint Interventions: Ordered,Application   Post Interventions Patient Tolerated: Well Instructions Provided: Care of device   [name] 10/07/2020, 10:25 AM

## 2020-10-07 NOTE — Transfer of Care (Signed)
Immediate Anesthesia Transfer of Care Note  Patient: William [name]  Procedure(s) Performed: AMPUTATION THIRD TOE (Left Toe)  Patient Location: PACU  Anesthesia Type:MAC  Level of Consciousness: drowsy  Airway & Oxygen Therapy: Patient Spontanous Breathing  Post-op Assessment: Report given to RN and Post -op Vital signs reviewed and stable  Post vital signs: Reviewed and stable  Last Vitals:  Vitals Value Taken Time  BP 107/50 10/07/20 0828  Temp    Pulse 70 10/07/20 0829  Resp 10 10/07/20 0829  SpO2 100 % 10/07/20 0829  Vitals shown include unvalidated device data.  Last Pain:  Vitals:   10/07/20 0605  TempSrc: Oral  PainSc:          Complications: No complications documented.

## 2020-10-07 NOTE — Brief Op Note (Signed)
10/07/2020  8:19 AM  PATIENT:  William [name]  70 y.o. male  PRE-OPERATIVE DIAGNOSIS: Gangrene left third toe  POST-OPERATIVE DIAGNOSIS: Gangrene left third toe  PROCEDURE:  Procedure(s): AMPUTATION THIRD TOE (Left)  SURGEON:  Surgeon(s) and Role:    * Leanndra Pember, Stephan Minister, DPM - Primary    ASSISTANTS: none   ANESTHESIA:   local and MAC  EBL: 10 cc  BLOOD ADMINISTERED:none  DRAINS: none   LOCAL MEDICATIONS USED:  MARCAINE   10 cc  SPECIMEN:  Deep tissue culture, left third toe  DISPOSITION OF SPECIMEN: Deep tissue culture to microbiology, left third toe to histopathology  COUNTS:  YES  TOURNIQUET:   Total Tourniquet Time Documented: Calf (Left) - 5 minutes Total: Calf (Left) - 5 minutes   DICTATION: .Note written in EPIC  PLAN OF CARE: Return to inpatient unit stable for discharge from podiatric standpoint  PATIENT DISPOSITION:  PACU - hemodynamically stable.   Delay start of Pharmacological VTE agent (>24hrs) due to surgical blood loss or risk of bleeding: yes

## 2020-10-07 NOTE — Op Note (Signed)
Patient Name: William [name] DOB: 1951/07/21  MRN: [id number]   Date of Service: 10/06/2020 - 10/07/2020  Surgeon: Dr. Lanae Crumbly, DPM Assistants: None Pre-operative Diagnosis:  Gangrene of toe of left foot Diabetic foot infection Post-operative Diagnosis:  Gangrene of toe of left foot Diabetic foot infection Procedures:  1) amputation toe metatarsophalangeal joint left third Pathology/Specimens: ID Type Source Tests Collected by Time Destination  1 : LEFT 3RD TOE Amputation Toe, Left SURGICAL PATHOLOGY Criselda Peaches, DPM 10/07/2020 0757   A : LEFT 3RD TOE FOR AEROBIC AND ANAEROBIC CULTURES W/GRAM STAIN Amputation Toe, Left ANAEROBIC CULTURE W GRAM STAIN Criselda Peaches, DPM 10/07/2020 2751    Anesthesia: Monitored anesthesia care and local anesthesia Hemostasis:  Total Tourniquet Time Documented: Calf (Left) - 5 minutes Total: Calf (Left) - 5 minutes  Estimated Blood Loss: 5 mL Materials: * No implants in log * Medications: 10 cc of 0.5% Marcaine plain, 100 mg vancomycin powder Complications: None  Indications for Procedure:  This is a 70 y.o. male with a history of type 2 diabetes, ESRD who presented to the [facility] emergency room with a diabetic foot infection.  His toe progressed to gangrene after failing outpatient antibiotic therapy.  Amputation was recommended.  Informed consent was signed and reviewed and all risks, benefits and complications were discussed prior to surgery.   Procedure in Detail: Patient was identified in pre-operative holding area. Formal consent was signed and the left lower extremity was marked. Patient was brought back to the operating room. Anesthesia was induced. The extremity was prepped and draped in the usual sterile fashion. Timeout was taken to confirm patient name, laterality, and procedure prior to incision.   Attention was then directed to the left third toe where an incision was made just proximal to the area of gangrenous demarcation.  Dissection was carried down to level of bone.  Dissection was continued to the metatarsophalangeal joint joint and all collateral ligaments were freed at the joint.  The bone soft tissue attachments of the proximal phalanx were removed and passed for pathology.  A deep tissue culture was taken of the necrotic soft tissue from the toe. The remaining metatarsal head appeared healthy and viable.  The area was copiously irrigated.  The skin was reapproximated with Monocryl, nylon.   The foot was then dressed with Xeroform, 4 x 4's, Kerlix and an Ace wrap under light compression. Patient tolerated the procedure well.   Disposition: Following a period of post-operative monitoring, patient will be transferred back to his inpatient unit prior to discharge.

## 2020-10-07 NOTE — Anesthesia Postprocedure Evaluation (Signed)
Anesthesia Post Note  Patient: William [name]  Procedure(s) Performed: AMPUTATION THIRD TOE (Left Toe)     Patient location during evaluation: PACU Anesthesia Type: MAC Level of consciousness: awake and alert Pain management: pain level controlled Vital Signs Assessment: post-procedure vital signs reviewed and stable Respiratory status: spontaneous breathing, nonlabored ventilation, respiratory function stable and patient connected to nasal cannula oxygen Cardiovascular status: stable and blood pressure returned to baseline Postop Assessment: no apparent nausea or vomiting Anesthetic complications: no   No complications documented.  Last Vitals:  Vitals:   10/07/20 1443 10/07/20 1501  BP: (!) 114/50 101/69  Pulse: 78 85  Resp:  18  Temp: 36.7 C 36.6 C  SpO2:  100%    Last Pain:  Vitals:   10/07/20 1501  TempSrc: Oral  PainSc:                  [name] [name]

## 2020-10-07 NOTE — Progress Notes (Addendum)
Progress Note:  10/07/2020 7:18 AM  William [name]  has presented today for surgery, with the diagnosis of gangrene of left third toe.  The various methods of treatment have been discussed with the patient and family. After consideration of risks, benefits and other options for treatment, the patient has consented to   Procedure(s): AMPUTATION THIRD TOE (Left) as a surgical intervention.  The patient's history has been reviewed, patient examined, no change in status, stable for surgery.  I have reviewed the patient's chart and labs.  Questions were answered to the patient's satisfaction.    ABIs and PVRs reviewed, likely has some small vessel disease. With palpable pulses think it is reasonable to proceed with amputation, if there is concern for non healing post operatively can refer to vascular surgery as outpatient. I discussed this with him  Translation provided via video translation   Criselda Peaches

## 2020-10-07 NOTE — Discharge Summary (Cosign Needed)
Name: William [name] MRN: [id number] DOB: 1950/09/06 70 y.o. PCP: Horald Pollen, MD  Date of Admission: 10/06/2020  6:33 AM Date of Discharge:  10/07/2020 Attending Physician: Axel Filler, *  Discharge Diagnosis: 1. Gangrene of Left 3rd Toe s/p amputation of left 3rd toe and metatarsophalangeal joint  Discharge Medications: Allergies as of 10/07/2020      Reactions   Heparin Other (See Comments)   Patient is Muslim and is not permitted Pork derative due to religious belief)   Pork-derived Products Other (See Comments)   Patient does not eat pork due to religious beliefs      Medication List    STOP taking these medications   doxycycline 100 MG tablet Commonly known as: VIBRA-TABS     TAKE these medications   amoxicillin-clavulanate 875-125 MG tablet Commonly known as: Augmentin Take 1 tablet by mouth 2 (two) times daily for 14 days.   calcium acetate 667 MG capsule Commonly known as: PHOSLO Take 1 capsule (667 mg total) by mouth 3 (three) times daily with meals.   HYDROmorphone 2 MG tablet Commonly known as: Dilaudid Take 1 tablet (2 mg total) by mouth every 4 (four) hours as needed for up to 3 days for severe pain.   Insulin Pen Needle 31G X 5 MM Misc Commonly known as: Advocate Insulin Pen Needles Use four times daily to inject insulin   insulin regular 100 units/mL injection Commonly known as: NOVOLIN R Inject 6-8 Units into the skin See admin instructions. Inject 8 units subcutaneously with breakfast, and 6 units with lunch and supper   lactulose 10 GM/15ML solution Commonly known as: CHRONULAC Take 45 mLs (30 g total) by mouth 3 (three) times daily.   lidocaine-prilocaine cream Commonly known as: EMLA Apply 1 application topically See admin instructions. Apply topically prior to dialysis on Tuesday, Thursday, Saturday   MIRCERA IJ 1 Dose See admin instructions. Dialysis gives this medication   multivitamin with minerals Tabs tablet Take 1  tablet by mouth daily.   ondansetron 4 MG disintegrating tablet Commonly known as: Zofran ODT Take 1 tablet (4 mg total) by mouth 2 (two) times daily as needed for nausea or vomiting.   ONE TOUCH LANCETS Misc Use to test blood sugar three times daily   OneTouch Verio test strip Generic drug: glucose blood USE 1 STRIP TO CHECK GLUCOSE THREE TIMES DAILY. DX:E11.65   pantoprazole 40 MG tablet Commonly known as: PROTONIX Take 1 tablet (40 mg total) by mouth 2 (two) times daily.   rifaximin 550 MG Tabs tablet Commonly known as: XIFAXAN Take 1 tablet (550 mg total) by mouth 2 (two) times daily.   rosuvastatin 5 MG tablet Commonly known as: CRESTOR Take 1 tablet by mouth once daily   sevelamer carbonate 800 MG tablet Commonly known as: RENVELA Take 800 mg by mouth 3 (three) times daily.   [name] 100 UNIT/ML FlexTouch Pen Generic drug: insulin degludec Inject 14 Units into the skin daily. Inject 14 units under the skin once daily. NO FURTHER REFILLS UNTIL PATIENT IS SEEN IN THE OFFICE What changed:   how much to take  when to take this  additional instructions            Discharge Care Instructions  (From admission, onward)         Start     Ordered   10/07/20 0000  Leave dressing on - Keep it clean, dry, and intact until clinic visit  10/07/20 1544          Disposition and follow-up:   William [name] was discharged from [facility] in [place] condition.  At the hospital follow up visit please address:  1.  Gangrene of left 3rd toe s/p amputation of left 3rd toe and metatarsophalangeal joint. Weight-bearing as tolerated in surgical shoe. Discharge patient today with 14 day course of augmentin (day 1 is 10/07/2020) and with 3 day course of PO dilaudid 54m q4h prn for severe pain. Dr. MSherryle Lis Podiatry, to follow up on toe culture and pathology. Patient will need to f/u with PCP, Dr. SMitchel Honour in 2 weeks to optimize diabetic  medications and evaluate post-op healing.  2.  Labs / imaging needed at time of follow-up: CBC, CMP, Hemoglobin A1c  3.  Pending labs/ test needing follow-up: Toe cultures and pathology, blood cultures  Follow-up Appointments:  Follow-up Information    [name]. Schedule an appointment as soon as possible for a visit in 2 week(s).   Specialty: Internal Medicine Contact information: [phone]               Hospital Course by problem list:  Gangrene of left 3rd toe s/p amputation of left 3rd toe and metatarsophalangeal joint Type 2 DM Patient admitted with diabetic foot infection with overlying moderate skin and soft tissue infection. ABI/TBI suggestive of some small vessel disease, but patient with palpable pulses. Left foot x-ray showing soft tissue ulcer in distal third toe, but no underlying bony destructive changes seen. For glycemic control, patient on lantus 7 units qhs and sensitive sliding scale insulin throughout admission. Vancomycin and zosyn initiated for broad spectrum antibiotic coverage and only tylenol prn needed for pain control. Antibiotics (vanc/zosyn) were held yesterday in anticipation for surgical resection of necrotic toe/tissue in order to increase surgical culture yield as patient has no systemic symptoms to suggest sepsis. Podiatry, Dr. MSherryle Lis took patient to OR for surgical resection of the necrotic toe/tissue this AM. As per Dr. MSherryle Lis perfusion was great and no need for vascular consult at this time. Dr. MSherryle Liswill coordinate with patient as outpatient should there be any issues with healing. Patient is weight-bearing as tolerated in a surgical shoe. No need for patient to remain inpatient to await toe culture and pathology, Dr. MSherryle Liswill follow up on this with patient. Will discharge patient today with 14 days of augmentin for antimicrobial coverage and 3-day supply of PO dilaudid [id number] prn  for severe pain. Will have patient follow up with PCP, Dr. SaMitchel Honourin 2 weeks.  ESRD on HD TTS Goodpasture Syndrome [facility] on Thursday. Nephrology consulted. Patient has been dialyzed today and will revert back to normal outpatient HD schedule starting Tuesday.  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 Pancytopenia thought to be secondary to liver disease. Resumed his home lactulose and rifaximin while here, and will continue at discharge. [facility]:   BP 101/69 (BP Location: Right Arm)   Pulse 85   Temp 97.8 F (36.6 C) (Oral)   Resp 18   Ht 5' 5"  (1.651 m)   Wt 66.8 kg   SpO2 100%   BMI 24.51 kg/m  Discharge exam: General: elderly male, lying in bed, NAD. CV: normal rate and regular rhythm, no  m/r/g. Lungs: CTABL, no adventitious sounds noted. Abdomen: soft, nondistended, nontender. Normoactive bowel sounds. Extremities: Trace bilateral lower extremity edema, left foot third toe bandage clean, dry, intact. Neuro: AAOx3, no focal deficits noted.    Pertinent Labs, Studies, and Procedures:  CBC Latest Ref Rng & Units 10/07/2020 10/06/2020 07/07/2020  WBC 4.0 - 10.5 K/uL 2.5(L) 3.7(L) 3.5(L)  Hemoglobin 13.0 - 17.0 g/dL 9.9(L) 11.7(L) 9.6(L)  Hematocrit 39.0 - 52.0 % 31.1(L) 37.5(L) 30.3(L)  Platelets 150 - 400 K/uL 55(L) 68(L) 42(L)   CMP Latest Ref Rng & Units 10/07/2020 10/06/2020 07/07/2020  Glucose 70 - 99 mg/dL 482(H) 82 167(H)  BUN 8 - 23 mg/dL 48(H) 32(H) 15  Creatinine 0.61 - 1.24 mg/dL 6.07(H) 4.93(H) 5.14(H)  Sodium 135 - 145 mmol/L 132(L) 135 139  Potassium 3.5 - 5.1 mmol/L 5.3(H) 4.3 3.8  Chloride 98 - 111 mmol/L 93(L) 94(L) 99  CO2 22 - 32 mmol/L 23 27 27   Calcium 8.9 - 10.3 mg/dL 8.4(L) 8.7(L) 8.6(L)  Total Protein 6.5 - 8.1 g/dL 5.7(L)  6.8 -  Total Bilirubin 0.3 - 1.2 mg/dL 1.6(H) 1.5(H) -  Alkaline Phos 38 - 126 U/L 133(H) 160(H) -  AST 15 - 41 U/L 29 34 -  ALT 0 - 44 U/L 17 16 -    PT - 16.1 INR - 1.3 aPTT - 38  Blood cultures - pending Surgical Toe cultures and pathology - pending   Discharge Instructions: Discharge Instructions    Call MD for:  difficulty breathing, headache or visual disturbances   Complete by: As directed    Call MD for:  extreme fatigue   Complete by: As directed    Call MD for:  hives   Complete by: As directed    Call MD for:  persistant dizziness or light-headedness   Complete by: As directed    Call MD for:  persistant nausea and vomiting   Complete by: As directed    Call MD for:  redness, tenderness, or signs of infection (pain, swelling, redness, odor or green/yellow discharge around incision site)   Complete by: As directed    Call MD for:  severe uncontrolled pain   Complete by: As directed    Call MD for:  temperature >100.4   Complete by: As directed    Diet - low sodium heart healthy   Complete by: As directed    Discharge instructions   Complete by: As directed    William [name], it was a pleasure taking care of you during your time here. You came in with a foot infection from diabetes and needed to have your left third toe amputated.  Please make sure to do the following:  1. I have prescribed you an antibiotic (Amoxicillin-Clavulanate) and sent it to your pharmacy. Please make sure to take it twice a day for the next 14 days.  2. Please keep your left foot in the surgical shoe and you can be weight bearing as tolerated.  3. Please schedule an appointment to see your primary care doctor, Dr. Mitchel Honour, in 2-3 weeks to manage your diabetes medications and to take a look at your foot.  4. Please continue going to your dialysis sessions every Tuesday, Thursday, Saturday.   Increase activity slowly   Complete by: As directed    Leave dressing on - Keep it clean, dry, and  intact until clinic visit   Complete by: As directed       Signed: Virl Axe, MD 10/07/2020, 3:44 PM  Pager: [phone]

## 2020-10-07 NOTE — Anesthesia Procedure Notes (Signed)
Procedure Name: MAC Date/Time: 10/07/2020 7:38 AM Performed by: Kyung Rudd, CRNA Pre-anesthesia Checklist: Patient identified, Emergency Drugs available, Suction available and Patient being monitored Patient Re-evaluated:Patient Re-evaluated prior to induction Oxygen Delivery Method: Simple face mask Induction Type: IV induction Placement Confirmation: positive ETCO2 Dental Injury: Teeth and Oropharynx as per pre-operative assessment

## 2020-10-07 NOTE — Progress Notes (Addendum)
Subjective: Seen in room with son interpreting no complaints status post left third toe amputation, for dialysis, per son okay for discharge after HD  Objective Vital signs in last 24 hours: Vitals:   10/07/20 0830 10/07/20 0845 10/07/20 0855 10/07/20 0916  BP: (!) 107/50 (!) 127/56 (!) 135/59 (!) 143/58  Pulse: 71 75 74 75  Resp: 14 13 13 17   Temp: (!) 97 F (36.1 C)  (!) 97 F (36.1 C) 97.7 F (36.5 C)  TempSrc:    Oral  SpO2: 100% 96% 96% 100%  Weight:      Height:       Weight change:  Physical Exam General: Alert,  male  NAD Heart: RRR, 2/6 murmur, no rub or gallop Lungs: CTA, nonlabored breathing Abdomen: BS+, normoactive, soft, ND NT Extremities: Trace bi pedal edema, left foot third toe bandage dry clear  Dialysis Access: Positive bruit left upper arm AV fistula  Dialysis Orders: Center: [facility]  , TTS 4 hours. EDW 3.5 kg, 2K, 2 CA bath   HD Bath   No Heparin  Left UA AVF.  HEC 1 mcg IV/HD  Mircera 60 MCG q 2 weeks last given 10/03/20  :Problem/Plan: 1. Left Third  Toe necrotic wound=  status post amputation 3/5,  noted vancomycin and Zosyn ordered per admit -  Likely will not need post op 2. ESRD -HD TTS schedule, K5.3  HD today on schedule   3. Hypertension/volume  -no volume overload on admission, BP minimal elevated, no home BP meds , follow-up BP trend UF with HD today 4. Anemia of ESRD-Hgb 11.7 admit now 9.9, follow-up trend , ESA given 3/01, follow-up trend 5. metabolic bone disease -continue calcium acetate binders with meals and vitamin D, follow-up calcium phosphorus trend 6. Nutrition -renal/carbohydrate modified diet, add Nepro protein supplement Albumin 2.3 7. Diabetes mellitus type 2- per admit 8. Dispo-  For discharge after HD  William Haber, PA-C [place] [phone] 10/07/2020,9:56 AM  LOS: 1 day    Patient seen and examined, agree with above note with above modifications. Looks great s/p toe amputation-  Plan is for  HD and home -  Would not start any BP meds-  We can watch his BP as OP and act as needed  William Parish, MD 10/07/2020     Labs: Basic Metabolic Panel: Recent Labs  Lab 10/06/20 0655 10/07/20 0053  NA 135 132*  K 4.3 5.3*  CL 94* 93*  CO2 27 23  GLUCOSE 82 482*  BUN 32* 48*  CREATININE 4.93* 6.07*  CALCIUM 8.7* 8.4*   Liver Function Tests: Recent Labs  Lab 10/06/20 0655 10/07/20 0053  AST 34 29  ALT 16 17  ALKPHOS 160* 133*  BILITOT 1.5* 1.6*  PROT 6.8 5.7*  ALBUMIN 2.8* 2.3*   No results for input(s): LIPASE, AMYLASE in the last 168 hours. No results for input(s): AMMONIA in the last 168 hours. CBC: Recent Labs  Lab 10/06/20 0655 10/07/20 0053  WBC 3.7* 2.5*  NEUTROABS 1.5*  --   HGB 11.7* 9.9*  HCT 37.5* 31.1*  MCV 87.6 85.9  PLT 68* 55*   Cardiac Enzymes: No results for input(s): CKTOTAL, CKMB, CKMBINDEX, TROPONINI in the last 168 hours. CBG: Recent Labs  Lab 10/06/20 2219 10/07/20 0540 10/07/20 0707 10/07/20 0830 10/07/20 0930  GLUCAP 369* 312* 303* 238* 184*    Studies/Results: DG Toe 3rd Left  Result Date: 10/06/2020 CLINICAL DATA:  Third toe pain and known diabetic ulcer  EXAM: LEFT THIRD TOE COMPARISON:  04/03/2020 FINDINGS: Soft tissue ulcer is noted along the distal aspect of the third toe. No definitive bony erosive changes are noted to suggest osteomyelitis. No fracture is noted. IMPRESSION: Soft tissue ulcer in the distal third toe. No underlying bony destructive changes are seen. Electronically Signed   By: Inez Catalina M.D.   On: 10/06/2020 07:12   VAS [place] ABI WITH/WO TBI  Result Date: 10/06/2020 LOWER EXTREMITY DOPPLER STUDY Indications: Gangrene. High Risk Factors: Hypertension, Diabetes.  Limitations: Today's exam was limited due to Left HD access, skin changes. Comparison Study: No prior studies. Performing Technologist: Carlos Levering RVT  Examination Guidelines: A complete evaluation includes at minimum, Doppler waveform signals  and systolic blood pressure reading at the level of bilateral brachial, anterior tibial, and posterior tibial arteries, when vessel segments are accessible. Bilateral testing is considered an integral part of a complete examination. Photoelectric Plethysmograph (PPG) waveforms and toe systolic pressure readings are included as required and additional duplex testing as needed. Limited examinations for reoccurring indications may be performed as noted.  ABI Findings: +---------+------------------+-----+---------+--------+ Right    Rt Pressure (mmHg)IndexWaveform Comment  +---------+------------------+-----+---------+--------+ Brachial 178                    triphasic         +---------+------------------+-----+---------+--------+ PTA      225               1.26 triphasic         +---------+------------------+-----+---------+--------+ DP       232               1.30 triphasic         +---------+------------------+-----+---------+--------+ Great Toe41                0.23                   +---------+------------------+-----+---------+--------+ +---------+------------------+-----+---------+---------+ Left     Lt Pressure (mmHg)IndexWaveform Comment   +---------+------------------+-----+---------+---------+ Brachial                                 HD access +---------+------------------+-----+---------+---------+ PTA      224               1.26 triphasic          +---------+------------------+-----+---------+---------+ DP       214               1.20 biphasic           +---------+------------------+-----+---------+---------+ Great Toe122               0.69                    +---------+------------------+-----+---------+---------+ +-------+-----------+-----------+------------+------------+ ABI/TBIToday's ABIToday's TBIPrevious ABIPrevious TBI +-------+-----------+-----------+------------+------------+ Right  1.3        0.23                                 +-------+-----------+-----------+------------+------------+ Left   1.26       0.69                                +-------+-----------+-----------+------------+------------+  Summary: Right: Resting right ankle-brachial index is within normal range. No evidence of significant right lower extremity arterial disease.  The right toe-brachial index is abnormal. TBI likely unreliable due to skin changes on the great toe. Left: Resting left ankle-brachial index is within normal range. No evidence of significant left lower extremity arterial disease. The left toe-brachial index is abnormal.  *See table(s) above for measurements and observations.  Electronically signed by Jamelle Haring on 10/06/2020 at 4:47:48 PM.    Final    Medications:  . aspirin  81 mg Oral Daily  . doxercalciferol  1 mcg Intravenous Q T,Th,Sa-HD  . insulin aspart  0-9 Units Subcutaneous TID WC  . insulin glargine  7 Units Subcutaneous QHS  . lactulose  30 g Oral TID  . rifaximin  550 mg Oral BID  . rosuvastatin  5 mg Oral Daily

## 2020-10-08 ENCOUNTER — Encounter (HOSPITAL_COMMUNITY): Payer: Self-pay | Admitting: Podiatry

## 2020-10-08 ENCOUNTER — Telehealth: Payer: Self-pay | Admitting: Nephrology

## 2020-10-08 NOTE — Telephone Encounter (Signed)
Transition of care contact from inpatient facility  Date of discharge: 10/07/20 Date of contact: 10/08/20 Method: Phone Spoke to: Patient"s son =main caregiver  Patient contacted to discuss transition of care from recent inpatient hospitalization. Patient was admitted to [facility] from 10/06/20- 10/07/20 . with discharge diagnosis of L Gangrenous middle toe (Had amputation)  Medication changes were reviewed with Son And told decrease Augmentin 875 bid to q day only after hd   Patient will follow up with his/her outpatient HD unit on: Tuesday  10/10/20 OP HD center

## 2020-10-09 DIAGNOSIS — Z01810 Encounter for preprocedural cardiovascular examination: Secondary | ICD-10-CM | POA: Diagnosis not present

## 2020-10-09 DIAGNOSIS — Z7682 Awaiting organ transplant status: Secondary | ICD-10-CM | POA: Diagnosis not present

## 2020-10-09 DIAGNOSIS — R932 Abnormal findings on diagnostic imaging of liver and biliary tract: Secondary | ICD-10-CM | POA: Diagnosis not present

## 2020-10-09 DIAGNOSIS — I071 Rheumatic tricuspid insufficiency: Secondary | ICD-10-CM | POA: Diagnosis not present

## 2020-10-09 DIAGNOSIS — I120 Hypertensive chronic kidney disease with stage 5 chronic kidney disease or end stage renal disease: Secondary | ICD-10-CM | POA: Diagnosis not present

## 2020-10-09 DIAGNOSIS — R188 Other ascites: Secondary | ICD-10-CM | POA: Diagnosis not present

## 2020-10-09 DIAGNOSIS — K7460 Unspecified cirrhosis of liver: Secondary | ICD-10-CM | POA: Diagnosis not present

## 2020-10-09 DIAGNOSIS — I10 Essential (primary) hypertension: Secondary | ICD-10-CM | POA: Diagnosis not present

## 2020-10-09 DIAGNOSIS — E785 Hyperlipidemia, unspecified: Secondary | ICD-10-CM | POA: Diagnosis not present

## 2020-10-09 DIAGNOSIS — Z992 Dependence on renal dialysis: Secondary | ICD-10-CM | POA: Diagnosis not present

## 2020-10-09 DIAGNOSIS — Z01818 Encounter for other preprocedural examination: Secondary | ICD-10-CM | POA: Diagnosis not present

## 2020-10-09 DIAGNOSIS — I272 Pulmonary hypertension, unspecified: Secondary | ICD-10-CM | POA: Diagnosis not present

## 2020-10-09 DIAGNOSIS — N186 End stage renal disease: Secondary | ICD-10-CM | POA: Diagnosis not present

## 2020-10-09 DIAGNOSIS — E1122 Type 2 diabetes mellitus with diabetic chronic kidney disease: Secondary | ICD-10-CM | POA: Diagnosis not present

## 2020-10-10 LAB — SURGICAL PATHOLOGY

## 2020-10-11 LAB — CULTURE, BLOOD (ROUTINE X 2)
Culture: NO GROWTH
Culture: NO GROWTH

## 2020-10-12 DIAGNOSIS — E1129 Type 2 diabetes mellitus with other diabetic kidney complication: Secondary | ICD-10-CM | POA: Diagnosis not present

## 2020-10-12 DIAGNOSIS — N186 End stage renal disease: Secondary | ICD-10-CM | POA: Diagnosis not present

## 2020-10-12 DIAGNOSIS — Z992 Dependence on renal dialysis: Secondary | ICD-10-CM | POA: Diagnosis not present

## 2020-10-12 DIAGNOSIS — I96 Gangrene, not elsewhere classified: Secondary | ICD-10-CM | POA: Diagnosis not present

## 2020-10-13 ENCOUNTER — Other Ambulatory Visit (HOSPITAL_COMMUNITY)
Admission: RE | Admit: 2020-10-13 | Discharge: 2020-10-13 | Disposition: A | Discharge status: Home / Self Care | Payer: Medicare Other | Source: Ambulatory Visit | Attending: Vascular Surgery | Admitting: Vascular Surgery

## 2020-10-13 DIAGNOSIS — Z20822 Contact with and (suspected) exposure to covid-19: Secondary | ICD-10-CM | POA: Insufficient documentation

## 2020-10-13 DIAGNOSIS — Z01812 Encounter for preprocedural laboratory examination: Secondary | ICD-10-CM | POA: Insufficient documentation

## 2020-10-13 LAB — SARS CORONAVIRUS 2 (TAT 6-24 HRS): SARS Coronavirus 2: NEGATIVE

## 2020-10-15 DIAGNOSIS — S98132A Complete traumatic amputation of one left lesser toe, initial encounter: Secondary | ICD-10-CM | POA: Insufficient documentation

## 2020-10-16 ENCOUNTER — Other Ambulatory Visit: Payer: Self-pay

## 2020-10-16 ENCOUNTER — Encounter (HOSPITAL_COMMUNITY)
Admission: RE | Disposition: A | Discharge status: Home / Self Care | Payer: Self-pay | Source: Home / Self Care | Attending: Vascular Surgery

## 2020-10-16 ENCOUNTER — Ambulatory Visit (HOSPITAL_COMMUNITY)
Admission: RE | Admit: 2020-10-16 | Discharge: 2020-10-16 | Disposition: A | Discharge status: Home / Self Care | Payer: Medicare Other | Attending: Vascular Surgery | Admitting: Vascular Surgery

## 2020-10-16 DIAGNOSIS — Z888 Allergy status to other drugs, medicaments and biological substances status: Secondary | ICD-10-CM | POA: Diagnosis not present

## 2020-10-16 DIAGNOSIS — T82898A Other specified complication of vascular prosthetic devices, implants and grafts, initial encounter: Secondary | ICD-10-CM | POA: Diagnosis not present

## 2020-10-16 DIAGNOSIS — Z794 Long term (current) use of insulin: Secondary | ICD-10-CM | POA: Insufficient documentation

## 2020-10-16 DIAGNOSIS — N186 End stage renal disease: Secondary | ICD-10-CM | POA: Diagnosis not present

## 2020-10-16 DIAGNOSIS — Y832 Surgical operation with anastomosis, bypass or graft as the cause of abnormal reaction of the patient, or of later complication, without mention of misadventure at the time of the procedure: Secondary | ICD-10-CM | POA: Insufficient documentation

## 2020-10-16 DIAGNOSIS — E1122 Type 2 diabetes mellitus with diabetic chronic kidney disease: Secondary | ICD-10-CM | POA: Insufficient documentation

## 2020-10-16 DIAGNOSIS — Z79899 Other long term (current) drug therapy: Secondary | ICD-10-CM | POA: Diagnosis not present

## 2020-10-16 DIAGNOSIS — T82510A Breakdown (mechanical) of surgically created arteriovenous fistula, initial encounter: Secondary | ICD-10-CM | POA: Insufficient documentation

## 2020-10-16 DIAGNOSIS — Z992 Dependence on renal dialysis: Secondary | ICD-10-CM | POA: Insufficient documentation

## 2020-10-16 HISTORY — PX: A/V FISTULAGRAM: CATH118298

## 2020-10-16 LAB — POCT I-STAT, CHEM 8
BUN: 45 mg/dL — ABNORMAL HIGH (ref 8–23)
Calcium, Ion: 1.06 mmol/L — ABNORMAL LOW (ref 1.15–1.40)
Chloride: 98 mmol/L (ref 98–111)
Creatinine, Ser: 6.5 mg/dL — ABNORMAL HIGH (ref 0.61–1.24)
Glucose, Bld: 177 mg/dL — ABNORMAL HIGH (ref 70–99)
HCT: 31 % — ABNORMAL LOW (ref 39.0–52.0)
Hemoglobin: 10.5 g/dL — ABNORMAL LOW (ref 13.0–17.0)
Potassium: 4.5 mmol/L (ref 3.5–5.1)
Sodium: 137 mmol/L (ref 135–145)
TCO2: 30 mmol/L (ref 22–32)

## 2020-10-16 SURGERY — A/V FISTULAGRAM
Anesthesia: LOCAL | Laterality: Left

## 2020-10-16 MED ORDER — LIDOCAINE HCL (PF) 1 % IJ SOLN
INTRAMUSCULAR | Status: AC
  Filled 2020-10-16: qty 30

## 2020-10-16 MED ORDER — IODIXANOL 320 MG/ML IV SOLN
INTRAVENOUS | Status: DC | PRN
  Administered 2020-10-16: 30 mL

## 2020-10-16 MED ORDER — HEPARIN (PORCINE) IN NACL 1000-0.9 UT/500ML-% IV SOLN
INTRAVENOUS | Status: AC
  Filled 2020-10-16: qty 500

## 2020-10-16 MED ORDER — SODIUM CHLORIDE 0.9% FLUSH: 3.0000 mL | Freq: Two times a day (BID) | INTRAVENOUS | Status: DC

## 2020-10-16 MED ORDER — SODIUM CHLORIDE 0.9 % IV SOLN: 250.0000 mL | INTRAVENOUS | Status: DC | PRN

## 2020-10-16 MED ORDER — SODIUM CHLORIDE 0.9% FLUSH: 3.0000 mL | INTRAVENOUS | Status: DC | PRN

## 2020-10-16 MED ORDER — HEPARIN (PORCINE) IN NACL 1000-0.9 UT/500ML-% IV SOLN
INTRAVENOUS | Status: DC | PRN
  Administered 2020-10-16: 500 mL

## 2020-10-16 MED ORDER — LIDOCAINE HCL (PF) 1 % IJ SOLN
INTRAMUSCULAR | Status: DC | PRN
  Administered 2020-10-16: 10 mL

## 2020-10-16 SURGICAL SUPPLY — 8 items
COVER DOME SNAP 22 D (MISCELLANEOUS) ×2 IMPLANT
KIT MICROPUNCTURE NIT STIFF (SHEATH) ×1 IMPLANT
PROTECTION STATION PRESSURIZED (MISCELLANEOUS) ×2
SHEATH PROBE COVER 6X72 (BAG) ×2 IMPLANT
STATION PROTECTION PRESSURIZED (MISCELLANEOUS) ×1 IMPLANT
STOPCOCK MORSE 400PSI 3WAY (MISCELLANEOUS) ×2 IMPLANT
TRAY PV CATH (CUSTOM PROCEDURE TRAY) ×2 IMPLANT
TUBING CIL FLEX 10 FLL-RA (TUBING) ×2 IMPLANT

## 2020-10-16 NOTE — Discharge Instructions (Signed)
Dialysis Fistulogram, Care After The following information offers guidance on how to care for yourself after your procedure. Your health care provider may also give you more specific instructions. If you have problems or questions, contact your health care provider. What can I expect after the procedure? After the procedure, it is common to have:  A small amount of discomfort in the area where the small tube (catheter) was placed for the procedure.  A small amount of bruising around the fistula.  Sleepiness and tiredness (fatigue). Follow these instructions at home: Puncture site care  Follow instructions from your health care provider about how to take care of the site where catheters were inserted. Make sure you: ? Wash your hands with soap and water for at least 20 seconds before and after you change your bandage (dressing). If soap and water are not available, use hand sanitizer. ? Change your dressing as told by your health care provider. ? Leave stitches (sutures), skin glue, or adhesive strips in place. These skin closures may need to stay in place for 2 weeks or longer. If adhesive strip edges start to loosen and curl up, you may trim the loose edges. Do not remove adhesive strips completely unless your health care provider tells you to do that.  Check your puncture area every day for signs of infection. Check for: ? More redness, swelling, or pain. ? Fluid or blood. ? Warmth. ? Pus or a bad smell.   Activity  Rest as much as you can.  If you were given a sedative during the procedure, it can affect you for several hours. Do not drive or operate machinery until your health care provider says that it is safe.  Do not lift anything that is heavier than 5 lb (2.3 kg), or the limit that you are told, on the day of your procedure.  Do not do anything strenuous with your arm for the rest of the day. Avoid household activities, such as vacuuming.  Return to your normal activities as  told by your health care provider. Ask your health care provider what activities are safe for you. Safety To prevent damage to your graft or fistula:  Do not wear tight-fitting clothing or jewelry on the arm or leg that has your graft or fistula.  Tell all your health care providers that you have a dialysis fistula or graft.  Do not allow blood draws, IVs, or blood pressure readings to be done in the arm that has your fistula or graft.  Do not allow flu shots or vaccinations in the arm with your fistula or graft. General instructions  Take over-the-counter and prescription medicines only as told by your health care provider.  Do not take baths, swim, or use a hot tub until your health care provider approves. Ask your health care provider if you may take showers. You may only be allowed to take sponge baths.  Monitor your dialysis fistula closely. Check to make sure that you can feel a vibration or buzz (a thrill) when you put your fingers over the fistula.  Keep all follow-up visits. This is important. Contact a health care provider if:  You have more redness, swelling, or pain at the site where the catheter was put in.  You have fluid or blood coming from the catheter site.  You have pus or a bad smell coming from the catheter site.  Your catheter site feels warm.  You have a fever or chills. Get help right away if:  You have bleeding from the vascular access site that does not stop.  You feel weak.  You have trouble balancing.  You have trouble moving your arms or legs.  You have problems with your speech or vision.  You can no longer feel a vibration or buzz when you put your fingers over your fistula.  The limb that was used for the procedure swells or becomes painful, cold, blue, or pale white.  You have chest pain or shortness of breath. These symptoms may represent a serious problem that is an emergency. Do not wait to see if the symptoms will go away. Get  medical help right away. Call your local emergency services (911 in the [place] Do not drive yourself to the hospital. Summary  After a dialysis fistulogram, it is common to have a small amount of discomfort or bruising in the area where the small, thin tube (catheter) was placed.  Rest as much as you can after your procedure. Return to your normal activities as told by your health care provider.  Take over-the-counter and prescription medicines only as told by your health care provider.  Follow instructions from your health care provider about how to take care of the site where the catheter was inserted.  Keep all follow-up visits. This is important. This information is not intended to replace advice given to you by your health care provider. Make sure you discuss any questions you have with your health care provider. Document Revised: 03/01/2020 Document Reviewed: 03/01/2020 Elsevier Patient Education  [place].

## 2020-10-16 NOTE — Op Note (Signed)
° ° °  Patient name: William [name] MRN: [id number] DOB: [birth year] Sex: male  10/16/2020 Pre-operative Diagnosis: End-stage renal disease, malfunction left arm AV fistula Post-operative diagnosis:  Same Surgeon:  Eda Paschal. Donzetta Matters, MD Procedure Performed: 1.  Ultrasound-guided cannulation left arm AV fistula 2.  Left upper extremity fistulogram  Indications: 70 year old male on dialysis via left arm AV fistula.  This has been intervened upon in the past.  He does have pulsatility in the fistula he is indicated for fistulogram with possible intervention.  Findings: There is an inflow stent which is patent.  There are 2 pseudoaneurysms between which there is approximately 60% stenosis relative to the regular diameter.  There is approximately 80% stenosis in the outflow of the vein.  No intervention was undertaken given the multiple areas of disease.  We will plan for conversion to upper arm AV graft and will need catheter for approximately 1 month.   Procedure:  The patient was identified in the holding area and taken to room 8.  The patient was then placed supine on the table and prepped and draped in the usual sterile fashion.  A time out was called.  Ultrasound was used to evaluate the left arm AV fistula.  There was noted to be a stent proximally in the fistula.  The area above the stent was cannulated with 1% lidocaine cannulated micropuncture needle followed by wire and sheath.  And images saved the permanent record.  Fistulogram was performed.  With the above findings we elected no intervention.  We suture-ligated the cannulation site and removed the sheath.  Patient tolerated procedure without immediate complication.     Contrast: 30 cc   Elayne Gruver C. Donzetta Matters, MD [facility] Office: [phone] Pager: [phone]

## 2020-10-16 NOTE — Interval H&P Note (Signed)
History and Physical Interval Note:  10/16/2020 8:33 AM  William [name]  has presented today for surgery, with the diagnosis of end stage renal disease.  The various methods of treatment have been discussed with the patient and family. After consideration of risks, benefits and other options for treatment, the patient has consented to  Procedure(s): A/V FISTULAGRAM (Left) as a surgical intervention.  The patient's history has been reviewed, patient examined, no change in status, stable for surgery.  I have reviewed the patient's chart and labs.  Questions were answered to the patient's satisfaction.     [name]

## 2020-10-17 ENCOUNTER — Other Ambulatory Visit: Payer: Self-pay

## 2020-10-17 ENCOUNTER — Encounter (HOSPITAL_COMMUNITY): Payer: Self-pay | Admitting: Vascular Surgery

## 2020-10-17 LAB — ANAEROBIC CULTURE W GRAM STAIN: Gram Stain: NONE SEEN

## 2020-10-18 ENCOUNTER — Encounter: Payer: Self-pay | Admitting: Endocrinology

## 2020-10-18 ENCOUNTER — Other Ambulatory Visit: Payer: Self-pay

## 2020-10-18 ENCOUNTER — Other Ambulatory Visit (INDEPENDENT_AMBULATORY_CARE_PROVIDER_SITE_OTHER): Payer: Medicare Other

## 2020-10-18 ENCOUNTER — Encounter: Payer: Medicare Other | Admitting: Endocrinology

## 2020-10-18 VITALS — BP 152/70 | HR 58 | Resp 18 | Ht 65.0 in | Wt 143.2 lb

## 2020-10-18 DIAGNOSIS — E7800 Pure hypercholesterolemia, unspecified: Secondary | ICD-10-CM

## 2020-10-18 DIAGNOSIS — E1121 Type 2 diabetes mellitus with diabetic nephropathy: Secondary | ICD-10-CM

## 2020-10-18 LAB — POCT GLYCOSYLATED HEMOGLOBIN (HGB A1C): Hemoglobin A1C: 6.2 % — AB (ref 4.0–5.6)

## 2020-10-18 LAB — LIPID PANEL
Cholesterol: 121 mg/dL (ref 0–200)
HDL: 29.3 mg/dL — ABNORMAL LOW (ref >39.00)
LDL Cholesterol: 70 mg/dL (ref 0–99)
NonHDL: 92.09
Total CHOL/HDL Ratio: 4
Triglycerides: 110 mg/dL (ref 0.0–149.0)
VLDL: 22 mg/dL (ref 0.0–40.0)

## 2020-10-18 LAB — GLUCOSE, POCT (MANUAL RESULT ENTRY): POC Glucose: 159 mg/dl — AB (ref 70–99)

## 2020-10-18 LAB — ALT: ALT: 11 U/L (ref 0–53)

## 2020-10-18 MED ORDER — ROSUVASTATIN CALCIUM 5 MG PO TABS: 5.0000 mg | ORAL_TABLET | Freq: Every day | ORAL | 0 refills | Status: DC

## 2020-10-18 MED ORDER — ONETOUCH VERIO VI STRP: ORAL_STRIP | 0 refills | Status: DC

## 2020-10-18 NOTE — Progress Notes (Unsigned)
This encounter was created in error - please disregard.

## 2020-10-19 ENCOUNTER — Ambulatory Visit (INDEPENDENT_AMBULATORY_CARE_PROVIDER_SITE_OTHER): Payer: Medicare Other | Admitting: Podiatry

## 2020-10-19 DIAGNOSIS — Z9889 Other specified postprocedural states: Secondary | ICD-10-CM

## 2020-10-19 DIAGNOSIS — Z89422 Acquired absence of other left toe(s): Secondary | ICD-10-CM | POA: Diagnosis not present

## 2020-10-19 DIAGNOSIS — E1149 Type 2 diabetes mellitus with other diabetic neurological complication: Secondary | ICD-10-CM

## 2020-10-20 NOTE — Progress Notes (Signed)
  Subjective:  Patient ID: William [name], male    DOB: 01/27/1951,  MRN: [id number]  Chief Complaint  Patient presents with  . Wound Check    PT stated that he is doing okay he has no concerns at this time    DOS: 10/07/2020 Procedure: Left third toe amputation  70 y.o. male returns for post-op check.  Doing well.  Minimal pain  Review of Systems: Negative except as noted in the HPI. Denies N/V/F/Ch.   Objective:  There were no vitals filed for this visit. There is no height or weight on file to calculate BMI. Constitutional Well developed. Well nourished.  Vascular Foot warm and well perfused. Capillary refill normal to all digits.   Neurologic Normal speech. Oriented to person, place, and time. Epicritic sensation to light touch grossly present bilaterally.  Dermatologic Skin healing well without signs of infection. Skin edges well coapted without signs of infection.  Orthopedic: Tenderness to palpation noted about the surgical site.   Radiographs: N/A Assessment:   1. Type II diabetes mellitus with neurological manifestations (William [name])   2. Post-operative state   3. Status post amputation of lesser toe of left foot (William [name])    Plan:  Patient was evaluated and treated and all questions answered.  S/p foot surgery left -Progressing as expected post-operatively. -WB Status: WBAT in surgical shoe -Sutures: We will remove in 1 week. -Medications: No refills required -Foot redressed. -I see no contraindication and there is no residual infection that he should not be placed back on the transplant list I will communicate this with his transplant team.  No follow-ups on file.

## 2020-10-26 ENCOUNTER — Other Ambulatory Visit: Payer: Self-pay

## 2020-10-26 ENCOUNTER — Ambulatory Visit (INDEPENDENT_AMBULATORY_CARE_PROVIDER_SITE_OTHER): Payer: Medicare Other | Admitting: Podiatry

## 2020-10-26 DIAGNOSIS — Z89422 Acquired absence of other left toe(s): Secondary | ICD-10-CM | POA: Diagnosis not present

## 2020-10-26 DIAGNOSIS — Z9889 Other specified postprocedural states: Secondary | ICD-10-CM | POA: Diagnosis not present

## 2020-10-26 DIAGNOSIS — E1149 Type 2 diabetes mellitus with other diabetic neurological complication: Secondary | ICD-10-CM

## 2020-10-26 MED ORDER — MUPIROCIN 2 % EX OINT: 1.0000 "application " | TOPICAL_OINTMENT | Freq: Every day | CUTANEOUS | 2 refills | Status: DC

## 2020-10-26 NOTE — Patient Instructions (Signed)
Change the dressing in 2 days with a bandaid and the ointment. OK to bathe, no soaking or bathtub

## 2020-10-30 ENCOUNTER — Encounter: Payer: Self-pay | Admitting: Podiatry

## 2020-10-30 NOTE — Progress Notes (Signed)
  Subjective:  Patient ID: William [name], male    DOB: 14-Oct-1950,  MRN: [id number]  Chief Complaint  Patient presents with  . Routine Post Op    PT stated that he is doing well he denies any pain at this time    DOS: 10/07/2020 Procedure: Left third toe amputation  70 y.o. male returns for post-op check.  Doing well.  Minimal pain  Review of Systems: Negative except as noted in the HPI. Denies N/V/F/Ch.   Objective:  There were no vitals filed for this visit. There is no height or weight on file to calculate BMI. Constitutional Well developed. Well nourished.  Vascular Foot warm and well perfused. Capillary refill normal to all digits.   Neurologic Normal speech. Oriented to person, place, and time. Epicritic sensation to light touch grossly present bilaterally.  Dermatologic  removed all the 1 sutures there is some small area of delayed healing without signs of infection  Orthopedic: Tenderness to palpation noted about the surgical site.   Radiographs: N/A Assessment:   1. Type II diabetes mellitus with neurological manifestations ([place])   2. Post-operative state   3. Status post amputation of lesser toe of left foot ([place])    Plan:  Patient was evaluated and treated and all questions answered.  S/p foot surgery left -Progressing as expected post-operatively. -WB Status: WBAT in surgical shoe -Sutures: Removed all but 1 there is an area of small delayed healing but I would like them to stay in for 1 more week. -Prescription for mupirocin sent to pharmacy they should change the bandage daily.  Return in about 1 week (around 11/02/2020) for final suture removal .

## 2020-11-01 ENCOUNTER — Other Ambulatory Visit (HOSPITAL_COMMUNITY)
Admission: RE | Admit: 2020-11-01 | Discharge: 2020-11-01 | Disposition: A | Discharge status: Home / Self Care | Payer: Medicare Other | Source: Ambulatory Visit | Attending: Vascular Surgery | Admitting: Vascular Surgery

## 2020-11-01 DIAGNOSIS — N2581 Secondary hyperparathyroidism of renal origin: Secondary | ICD-10-CM | POA: Diagnosis not present

## 2020-11-01 DIAGNOSIS — I959 Hypotension, unspecified: Secondary | ICD-10-CM | POA: Diagnosis not present

## 2020-11-01 DIAGNOSIS — R410 Disorientation, unspecified: Secondary | ICD-10-CM | POA: Diagnosis not present

## 2020-11-01 DIAGNOSIS — Z01812 Encounter for preprocedural laboratory examination: Secondary | ICD-10-CM | POA: Insufficient documentation

## 2020-11-01 DIAGNOSIS — N186 End stage renal disease: Secondary | ICD-10-CM | POA: Diagnosis not present

## 2020-11-01 DIAGNOSIS — K7460 Unspecified cirrhosis of liver: Secondary | ICD-10-CM | POA: Diagnosis not present

## 2020-11-01 DIAGNOSIS — T82858A Stenosis of vascular prosthetic devices, implants and grafts, initial encounter: Secondary | ICD-10-CM | POA: Diagnosis not present

## 2020-11-01 DIAGNOSIS — M310 Hypersensitivity angiitis: Secondary | ICD-10-CM | POA: Diagnosis not present

## 2020-11-01 DIAGNOSIS — Z8249 Family history of ischemic heart disease and other diseases of the circulatory system: Secondary | ICD-10-CM | POA: Diagnosis not present

## 2020-11-01 DIAGNOSIS — Z833 Family history of diabetes mellitus: Secondary | ICD-10-CM | POA: Diagnosis not present

## 2020-11-01 DIAGNOSIS — Z79899 Other long term (current) drug therapy: Secondary | ICD-10-CM | POA: Diagnosis not present

## 2020-11-01 DIAGNOSIS — D631 Anemia in chronic kidney disease: Secondary | ICD-10-CM | POA: Diagnosis not present

## 2020-11-01 DIAGNOSIS — R059 Cough, unspecified: Secondary | ICD-10-CM | POA: Diagnosis not present

## 2020-11-01 DIAGNOSIS — E872 Acidosis: Secondary | ICD-10-CM | POA: Diagnosis not present

## 2020-11-01 DIAGNOSIS — Y828 Other medical devices associated with adverse incidents: Secondary | ICD-10-CM | POA: Diagnosis not present

## 2020-11-01 DIAGNOSIS — T4275XA Adverse effect of unspecified antiepileptic and sedative-hypnotic drugs, initial encounter: Secondary | ICD-10-CM | POA: Diagnosis not present

## 2020-11-01 DIAGNOSIS — N170 Acute kidney failure with tubular necrosis: Secondary | ICD-10-CM | POA: Diagnosis not present

## 2020-11-01 DIAGNOSIS — Z20822 Contact with and (suspected) exposure to covid-19: Secondary | ICD-10-CM | POA: Diagnosis not present

## 2020-11-01 DIAGNOSIS — G928 Other toxic encephalopathy: Secondary | ICD-10-CM | POA: Diagnosis not present

## 2020-11-01 DIAGNOSIS — R3129 Other microscopic hematuria: Secondary | ICD-10-CM | POA: Diagnosis not present

## 2020-11-01 DIAGNOSIS — I132 Hypertensive heart and chronic kidney disease with heart failure and with stage 5 chronic kidney disease, or end stage renal disease: Secondary | ICD-10-CM | POA: Diagnosis not present

## 2020-11-01 DIAGNOSIS — T82510A Breakdown (mechanical) of surgically created arteriovenous fistula, initial encounter: Secondary | ICD-10-CM | POA: Diagnosis not present

## 2020-11-01 DIAGNOSIS — R4182 Altered mental status, unspecified: Secondary | ICD-10-CM | POA: Diagnosis not present

## 2020-11-01 DIAGNOSIS — Z992 Dependence on renal dialysis: Secondary | ICD-10-CM | POA: Diagnosis not present

## 2020-11-01 DIAGNOSIS — K760 Fatty (change of) liver, not elsewhere classified: Secondary | ICD-10-CM | POA: Diagnosis not present

## 2020-11-01 DIAGNOSIS — E1122 Type 2 diabetes mellitus with diabetic chronic kidney disease: Secondary | ICD-10-CM | POA: Diagnosis not present

## 2020-11-01 DIAGNOSIS — I5032 Chronic diastolic (congestive) heart failure: Secondary | ICD-10-CM | POA: Diagnosis not present

## 2020-11-01 DIAGNOSIS — R509 Fever, unspecified: Secondary | ICD-10-CM | POA: Diagnosis not present

## 2020-11-01 DIAGNOSIS — Z794 Long term (current) use of insulin: Secondary | ICD-10-CM | POA: Diagnosis not present

## 2020-11-01 DIAGNOSIS — J9811 Atelectasis: Secondary | ICD-10-CM | POA: Diagnosis not present

## 2020-11-01 LAB — SARS CORONAVIRUS 2 (TAT 6-24 HRS): SARS Coronavirus 2: NEGATIVE

## 2020-11-01 NOTE — Progress Notes (Signed)
   COVID TEST- 11/01/20  Anesthesia review: yes   -------------  SDW INSTRUCTIONS: only arrival time and NPO after midnight left on VM - pt could not be contacted  Your procedure is scheduled on 11/02/20. Please report to [facility] Main Entrance "A" at 0645 A.M., and check in at the Admitting office. Call this number if you have problems the morning of surgery: [phone]   Remember: Do not eat or drink after midnight the night before your surgery  Medications to take morning of surgery with a sip of water include: pantoprazole (PROTONIX) rosuvastatin (CRESTOR   insulin degludec (TRESIBA FLEXTOUCH) 7 units PM dose, 7 untis DOS insulin regular (NOVOLIN R,HUMULIN R) no bedtime dose, none DOS   ** PLEASE check your blood sugar the morning of your surgery when you wake up and every 2 hours until you get to the Short Stay unit.  If your blood sugar is less than 70 mg/dL, you will need to treat for low blood sugar: - Do not take insulin. - Treat a low blood sugar (less than 70 mg/dL) with  cup of clear juice (cranberry or apple), 4 glucose tablets, OR glucose gel. - Recheck blood sugar in 15 minutes after treatment (to make sure it is greater than 70 mg/dL). If your blood sugar is not greater than 70 mg/dL on recheck, call [phone] for further instructions.   As of today, STOP taking any Aspirin (unless otherwise instructed by your surgeon), Aleve, Naproxen, Ibuprofen, Motrin, Advil, Goody's, BC's, all herbal medications, fish oil, and all vitamins.    The Morning of Surgery Do not wear jewelry Do not wear lotions, powders, colognes, or deodorant Men may shave face and neck. Do not bring valuables to the hospital. [facility] is not responsible for any belongings or valuables. If you are a smoker, DO NOT Smoke 24 hours prior to surgery If you wear a CPAP at night please bring your mask the morning of surgery  Remember that you must have someone to transport you home after  your surgery, and remain with you for 24 hours if you are discharged the same day. Please bring cases for contacts, glasses, hearing aids, dentures or bridgework because it cannot be worn into surgery.   Patients discharged the day of surgery will not be allowed to drive home.   Please shower the NIGHT BEFORE SURGERY and the MORNING OF SURGERY with DIAL Soap. Wear comfortable clothes the morning of surgery. Oral Hygiene is also important to reduce your risk of infection.  Remember - BRUSH YOUR TEETH THE MORNING OF SURGERY WITH YOUR REGULAR TOOTHPASTE  Patient denies shortness of breath, fever, cough and chest pain.

## 2020-11-02 ENCOUNTER — Encounter (HOSPITAL_COMMUNITY)
Admission: RE | Disposition: A | Discharge status: Home / Self Care | Payer: Self-pay | Source: Home / Self Care | Attending: Vascular Surgery

## 2020-11-02 ENCOUNTER — Ambulatory Visit (HOSPITAL_COMMUNITY): Payer: Medicare Other | Admitting: Anesthesiology

## 2020-11-02 ENCOUNTER — Ambulatory Visit (HOSPITAL_COMMUNITY)
Admission: RE | Admit: 2020-11-02 | Discharge: 2020-11-02 | Disposition: A | Discharge status: Home / Self Care | Payer: Medicare Other | Source: Home / Self Care | Attending: Vascular Surgery | Admitting: Vascular Surgery

## 2020-11-02 ENCOUNTER — Other Ambulatory Visit: Payer: Self-pay

## 2020-11-02 ENCOUNTER — Ambulatory Visit (HOSPITAL_COMMUNITY): Payer: Medicare Other

## 2020-11-02 DIAGNOSIS — I5022 Chronic systolic (congestive) heart failure: Secondary | ICD-10-CM | POA: Diagnosis not present

## 2020-11-02 DIAGNOSIS — E872 Acidosis: Secondary | ICD-10-CM | POA: Diagnosis not present

## 2020-11-02 DIAGNOSIS — Z794 Long term (current) use of insulin: Secondary | ICD-10-CM | POA: Diagnosis not present

## 2020-11-02 DIAGNOSIS — N170 Acute kidney failure with tubular necrosis: Secondary | ICD-10-CM | POA: Diagnosis not present

## 2020-11-02 DIAGNOSIS — Z452 Encounter for adjustment and management of vascular access device: Secondary | ICD-10-CM

## 2020-11-02 DIAGNOSIS — T82590A Other mechanical complication of surgically created arteriovenous fistula, initial encounter: Secondary | ICD-10-CM

## 2020-11-02 DIAGNOSIS — J811 Chronic pulmonary edema: Secondary | ICD-10-CM | POA: Diagnosis not present

## 2020-11-02 DIAGNOSIS — Y841 Kidney dialysis as the cause of abnormal reaction of the patient, or of later complication, without mention of misadventure at the time of the procedure: Secondary | ICD-10-CM | POA: Insufficient documentation

## 2020-11-02 DIAGNOSIS — N185 Chronic kidney disease, stage 5: Secondary | ICD-10-CM | POA: Diagnosis not present

## 2020-11-02 DIAGNOSIS — E1122 Type 2 diabetes mellitus with diabetic chronic kidney disease: Secondary | ICD-10-CM | POA: Diagnosis not present

## 2020-11-02 DIAGNOSIS — M310 Hypersensitivity angiitis: Secondary | ICD-10-CM | POA: Diagnosis not present

## 2020-11-02 DIAGNOSIS — I132 Hypertensive heart and chronic kidney disease with heart failure and with stage 5 chronic kidney disease, or end stage renal disease: Secondary | ICD-10-CM | POA: Diagnosis not present

## 2020-11-02 DIAGNOSIS — I509 Heart failure, unspecified: Secondary | ICD-10-CM | POA: Insufficient documentation

## 2020-11-02 DIAGNOSIS — N186 End stage renal disease: Secondary | ICD-10-CM | POA: Diagnosis not present

## 2020-11-02 DIAGNOSIS — G928 Other toxic encephalopathy: Secondary | ICD-10-CM | POA: Diagnosis not present

## 2020-11-02 DIAGNOSIS — I5032 Chronic diastolic (congestive) heart failure: Secondary | ICD-10-CM | POA: Diagnosis not present

## 2020-11-02 DIAGNOSIS — Z79899 Other long term (current) drug therapy: Secondary | ICD-10-CM | POA: Insufficient documentation

## 2020-11-02 DIAGNOSIS — Z992 Dependence on renal dialysis: Secondary | ICD-10-CM | POA: Insufficient documentation

## 2020-11-02 DIAGNOSIS — Z792 Long term (current) use of antibiotics: Secondary | ICD-10-CM | POA: Insufficient documentation

## 2020-11-02 DIAGNOSIS — Z20822 Contact with and (suspected) exposure to covid-19: Secondary | ICD-10-CM | POA: Diagnosis not present

## 2020-11-02 DIAGNOSIS — N2581 Secondary hyperparathyroidism of renal origin: Secondary | ICD-10-CM | POA: Diagnosis not present

## 2020-11-02 DIAGNOSIS — T82858A Stenosis of vascular prosthetic devices, implants and grafts, initial encounter: Secondary | ICD-10-CM | POA: Diagnosis not present

## 2020-11-02 DIAGNOSIS — Z91014 Allergy to mammalian meats: Secondary | ICD-10-CM | POA: Insufficient documentation

## 2020-11-02 DIAGNOSIS — T82510A Breakdown (mechanical) of surgically created arteriovenous fistula, initial encounter: Secondary | ICD-10-CM | POA: Diagnosis not present

## 2020-11-02 DIAGNOSIS — T82898A Other specified complication of vascular prosthetic devices, implants and grafts, initial encounter: Secondary | ICD-10-CM | POA: Diagnosis not present

## 2020-11-02 HISTORY — PX: REVISON OF ARTERIOVENOUS FISTULA: SHX6074

## 2020-11-02 HISTORY — PX: INSERTION OF DIALYSIS CATHETER: SHX1324

## 2020-11-02 HISTORY — PX: AV FISTULA PLACEMENT: SHX1204

## 2020-11-02 LAB — POCT I-STAT, CHEM 8
BUN: 53 mg/dL — ABNORMAL HIGH (ref 8–23)
Calcium, Ion: 1.01 mmol/L — ABNORMAL LOW (ref 1.15–1.40)
Chloride: 96 mmol/L — ABNORMAL LOW (ref 98–111)
Creatinine, Ser: 7.7 mg/dL — ABNORMAL HIGH (ref 0.61–1.24)
Glucose, Bld: 172 mg/dL — ABNORMAL HIGH (ref 70–99)
HCT: 34 % — ABNORMAL LOW (ref 39.0–52.0)
Hemoglobin: 11.6 g/dL — ABNORMAL LOW (ref 13.0–17.0)
Potassium: 4.2 mmol/L (ref 3.5–5.1)
Sodium: 137 mmol/L (ref 135–145)
TCO2: 28 mmol/L (ref 22–32)

## 2020-11-02 LAB — GLUCOSE, CAPILLARY: Glucose-Capillary: 173 mg/dL — ABNORMAL HIGH (ref 70–99)

## 2020-11-02 SURGERY — INSERTION OF DIALYSIS CATHETER
Anesthesia: General | Site: Chest | Laterality: Right

## 2020-11-02 MED ORDER — ONDANSETRON HCL 4 MG/2ML IJ SOLN
INTRAMUSCULAR | Status: DC | PRN
  Administered 2020-11-02: 4 mg via INTRAVENOUS

## 2020-11-02 MED ORDER — CHLORHEXIDINE GLUCONATE 0.12 % MT SOLN
OROMUCOSAL | Status: AC
  Administered 2020-11-02: 15 mL via OROMUCOSAL
  Filled 2020-11-02: qty 15

## 2020-11-02 MED ORDER — MIDAZOLAM HCL 2 MG/2ML IJ SOLN
INTRAMUSCULAR | Status: AC
  Filled 2020-11-02: qty 2

## 2020-11-02 MED ORDER — ONDANSETRON HCL 4 MG/2ML IJ SOLN: 4.0000 mg | Freq: Once | INTRAMUSCULAR | Status: DC | PRN

## 2020-11-02 MED ORDER — CHLORHEXIDINE GLUCONATE 4 % EX LIQD: 60.0000 mL | Freq: Once | CUTANEOUS | Status: DC

## 2020-11-02 MED ORDER — ROCURONIUM BROMIDE 10 MG/ML (PF) SYRINGE
PREFILLED_SYRINGE | INTRAVENOUS | Status: DC | PRN
  Administered 2020-11-02: 50 mg via INTRAVENOUS

## 2020-11-02 MED ORDER — PHENYLEPHRINE 40 MCG/ML (10ML) SYRINGE FOR IV PUSH (FOR BLOOD PRESSURE SUPPORT)
PREFILLED_SYRINGE | INTRAVENOUS | Status: DC | PRN
  Administered 2020-11-02: 120 ug via INTRAVENOUS
  Administered 2020-11-02: 80 ug via INTRAVENOUS

## 2020-11-02 MED ORDER — FENTANYL CITRATE (PF) 250 MCG/5ML IJ SOLN
INTRAMUSCULAR | Status: AC
  Filled 2020-11-02: qty 5

## 2020-11-02 MED ORDER — LIDOCAINE 2% (20 MG/ML) 5 ML SYRINGE
INTRAMUSCULAR | Status: AC
  Filled 2020-11-02: qty 5

## 2020-11-02 MED ORDER — ORAL CARE MOUTH RINSE: 15.0000 mL | Freq: Once | OROMUCOSAL | Status: AC

## 2020-11-02 MED ORDER — NEOSTIGMINE METHYLSULFATE 10 MG/10ML IV SOLN
INTRAVENOUS | Status: DC | PRN
  Administered 2020-11-02: 3 mg via INTRAVENOUS

## 2020-11-02 MED ORDER — SODIUM CHLORIDE 0.9 % IV SOLN: INTRAVENOUS | Status: DC

## 2020-11-02 MED ORDER — ACETAMINOPHEN 10 MG/ML IV SOLN: 1000.0000 mg | Freq: Once | INTRAVENOUS | Status: DC | PRN

## 2020-11-02 MED ORDER — PROPOFOL 10 MG/ML IV BOLUS
INTRAVENOUS | Status: DC | PRN
  Administered 2020-11-02: 100 mg via INTRAVENOUS

## 2020-11-02 MED ORDER — CEFAZOLIN SODIUM-DEXTROSE 2-4 GM/100ML-% IV SOLN: 2.0000 g | INTRAVENOUS | Status: DC

## 2020-11-02 MED ORDER — ROCURONIUM BROMIDE 10 MG/ML (PF) SYRINGE
PREFILLED_SYRINGE | INTRAVENOUS | Status: AC
  Filled 2020-11-02: qty 10

## 2020-11-02 MED ORDER — OXYCODONE-ACETAMINOPHEN 5-325 MG PO TABS: 1.0000 | ORAL_TABLET | ORAL | 0 refills | Status: DC | PRN

## 2020-11-02 MED ORDER — ANTICOAGULANT SODIUM CITRATE 4% (200MG/5ML) IV SOLN
10.0000 mL | Status: AC
  Administered 2020-11-02: 3.2 mL
  Filled 2020-11-02: qty 10

## 2020-11-02 MED ORDER — FENTANYL CITRATE (PF) 100 MCG/2ML IJ SOLN
INTRAMUSCULAR | Status: DC | PRN
  Administered 2020-11-02: 150 ug via INTRAVENOUS

## 2020-11-02 MED ORDER — PHENYLEPHRINE HCL-NACL 10-0.9 MG/250ML-% IV SOLN
INTRAVENOUS | Status: DC | PRN
  Administered 2020-11-02: 40 ug/min via INTRAVENOUS

## 2020-11-02 MED ORDER — FENTANYL CITRATE (PF) 100 MCG/2ML IJ SOLN: 25.0000 ug | INTRAMUSCULAR | Status: DC | PRN

## 2020-11-02 MED ORDER — LIDOCAINE 2% (20 MG/ML) 5 ML SYRINGE
INTRAMUSCULAR | Status: DC | PRN
  Administered 2020-11-02: 60 mg via INTRAVENOUS

## 2020-11-02 MED ORDER — SUCCINYLCHOLINE CHLORIDE 200 MG/10ML IV SOSY
PREFILLED_SYRINGE | INTRAVENOUS | Status: AC
  Filled 2020-11-02: qty 10

## 2020-11-02 MED ORDER — CEFAZOLIN SODIUM-DEXTROSE 2-3 GM-%(50ML) IV SOLR
INTRAVENOUS | Status: DC | PRN
  Administered 2020-11-02: 2 g via INTRAVENOUS

## 2020-11-02 MED ORDER — GLYCOPYRROLATE 0.2 MG/ML IJ SOLN
INTRAMUSCULAR | Status: DC | PRN
  Administered 2020-11-02: .4 mg via INTRAVENOUS

## 2020-11-02 MED ORDER — HEMOSTATIC AGENTS (NO CHARGE) OPTIME
TOPICAL | Status: DC | PRN
  Administered 2020-11-02: 1 via TOPICAL

## 2020-11-02 MED ORDER — CHLORHEXIDINE GLUCONATE 0.12 % MT SOLN: 15.0000 mL | Freq: Once | OROMUCOSAL | Status: AC

## 2020-11-02 MED ORDER — CEFAZOLIN SODIUM-DEXTROSE 2-4 GM/100ML-% IV SOLN
INTRAVENOUS | Status: AC
  Filled 2020-11-02: qty 100

## 2020-11-02 MED ORDER — SODIUM CHLORIDE 0.9 % IV SOLN
INTRAVENOUS | Status: AC | PRN
  Administered 2020-11-02: 500 mL via INTRAMUSCULAR

## 2020-11-02 MED ORDER — 0.9 % SODIUM CHLORIDE (POUR BTL) OPTIME
TOPICAL | Status: DC | PRN
  Administered 2020-11-02: 1000 mL

## 2020-11-02 SURGICAL SUPPLY — 54 items
ADH SKN CLS APL DERMABOND .7 (GAUZE/BANDAGES/DRESSINGS) ×2
ARMBAND PINK RESTRICT EXTREMIT (MISCELLANEOUS) ×3 IMPLANT
BAG DECANTER FOR FLEXI CONT (MISCELLANEOUS) ×3 IMPLANT
BIOPATCH RED 1 DISK 7.0 (GAUZE/BANDAGES/DRESSINGS) ×3 IMPLANT
CANISTER SUCT 3000ML PPV (MISCELLANEOUS) ×3 IMPLANT
CATH PALINDROME-P 19CM W/VT (CATHETERS) ×1 IMPLANT
CATH PALINDROME-P 23CM W/VT (CATHETERS) IMPLANT
CATH PALINDROME-P 28CM W/VT (CATHETERS) IMPLANT
CLIP VESOCCLUDE MED 6/CT (CLIP) ×3 IMPLANT
CLIP VESOCCLUDE SM WIDE 6/CT (CLIP) ×3 IMPLANT
COVER PROBE W GEL 5X96 (DRAPES) ×3 IMPLANT
COVER SURGICAL LIGHT HANDLE (MISCELLANEOUS) ×3 IMPLANT
COVER WAND RF STERILE (DRAPES) ×3 IMPLANT
DERMABOND ADVANCED (GAUZE/BANDAGES/DRESSINGS) ×1
DERMABOND ADVANCED .7 DNX12 (GAUZE/BANDAGES/DRESSINGS) ×2 IMPLANT
DRAPE C-ARM 42X72 X-RAY (DRAPES) ×3 IMPLANT
DRAPE CHEST BREAST 15X10 FENES (DRAPES) ×3 IMPLANT
ELECT REM PT RETURN 9FT ADLT (ELECTROSURGICAL) ×3
ELECTRODE REM PT RTRN 9FT ADLT (ELECTROSURGICAL) ×2 IMPLANT
GAUZE 4X4 16PLY RFD (DISPOSABLE) ×3 IMPLANT
GLOVE BIO SURGEON STRL SZ7.5 (GLOVE) ×3 IMPLANT
GOWN STRL REUS W/ TWL LRG LVL3 (GOWN DISPOSABLE) ×4 IMPLANT
GOWN STRL REUS W/ TWL XL LVL3 (GOWN DISPOSABLE) ×2 IMPLANT
GOWN STRL REUS W/TWL LRG LVL3 (GOWN DISPOSABLE) ×6
GOWN STRL REUS W/TWL XL LVL3 (GOWN DISPOSABLE) ×3
GRAFT VASCULAR 7X40 (Vascular Products) ×1 IMPLANT
INSERT FOGARTY SM (MISCELLANEOUS) ×1 IMPLANT
KIT BASIN OR (CUSTOM PROCEDURE TRAY) ×3 IMPLANT
KIT PALINDROME-P 55CM (CATHETERS) IMPLANT
KIT TURNOVER KIT B (KITS) ×3 IMPLANT
NDL 18GX1X1/2 (RX/OR ONLY) (NEEDLE) ×2 IMPLANT
NDL HYPO 25GX1X1/2 BEV (NEEDLE) ×2 IMPLANT
NEEDLE 18GX1X1/2 (RX/OR ONLY) (NEEDLE) ×3 IMPLANT
NEEDLE HYPO 25GX1X1/2 BEV (NEEDLE) ×3 IMPLANT
NS IRRIG 1000ML POUR BTL (IV SOLUTION) ×3 IMPLANT
PACK CV ACCESS (CUSTOM PROCEDURE TRAY) ×3 IMPLANT
PACK SURGICAL SETUP 50X90 (CUSTOM PROCEDURE TRAY) ×3 IMPLANT
PAD ARMBOARD 7.5X6 YLW CONV (MISCELLANEOUS) ×6 IMPLANT
POWDER SURGICEL 3.0 GRAM (HEMOSTASIS) ×1 IMPLANT
SOAP 2 % CHG 4 OZ (WOUND CARE) ×3 IMPLANT
SUT ETHILON 3 0 PS 1 (SUTURE) ×3 IMPLANT
SUT MNCRL AB 4-0 PS2 18 (SUTURE) ×5 IMPLANT
SUT PROLENE 6 0 BV (SUTURE) ×4 IMPLANT
SUT SILK 2 0 SH (SUTURE) ×1 IMPLANT
SUT VIC AB 3-0 SH 27 (SUTURE) ×6
SUT VIC AB 3-0 SH 27X BRD (SUTURE) ×2 IMPLANT
SYR 10ML LL (SYRINGE) ×3 IMPLANT
SYR 20ML LL LF (SYRINGE) ×6 IMPLANT
SYR 5ML LL (SYRINGE) ×3 IMPLANT
SYR CONTROL 10ML LL (SYRINGE) ×3 IMPLANT
TOWEL GREEN STERILE (TOWEL DISPOSABLE) ×3 IMPLANT
TOWEL GREEN STERILE FF (TOWEL DISPOSABLE) ×6 IMPLANT
UNDERPAD 30X36 HEAVY ABSORB (UNDERPADS AND DIAPERS) ×3 IMPLANT
WATER STERILE IRR 1000ML POUR (IV SOLUTION) ×3 IMPLANT

## 2020-11-02 NOTE — H&P (Signed)
HPI:  William [name] is a 70 y.o. male history of a two-stage basilic vein fistula on the left.  Recently he has thinning skin of this fistula.  He has undergone percutaneous thrombectomy of this fistula on at least one previous occasion.  He is now here for revision and conversion to graft and placement of tdc. He is not on blood thinners.  Currently dialyzes Tuesdays Thursdays and Saturdays.  Patient speaks Arabic being from the [place] all information was obtained from interpreter this morning.       Past Medical History:  Diagnosis Date  . Anemia   . Blind one eye   . CHF (congestive heart failure) ([place])   . Cirrhosis ([place])   . ESRD on hemodialysis ([place])   . Goodpasture's disease [facility])    on outpatient plasmapheresis/notes 11/20/2017  . Grade I diastolic dysfunction 19/50/9326  . History of anemia due to chronic kidney disease   . History of blood transfusion X 1   "UGIB; low blood count"  . History of plasmapheresis    "qod" (11/20/2017)  . Hypertension   . Pancytopenia ([place]) 08/20/2017  . Type II diabetes mellitus ([facility])         Family History  Problem Relation Age of Onset  . Diabetes Mellitus II Sister   . Diabetes Sister   . Stroke Brother   . Heart attack Brother         Past Surgical History:  Procedure Laterality Date  . AV FISTULA PLACEMENT Left 12/26/2017   Procedure: LEFT BRACHIOCEPHALIC ARTERIOVENOUS (AV) FISTULA CREATION;  Surgeon: Conrad Woonsocket, MD;  Location: [place];  Service: Vascular;  Laterality: Left;  . BASCILIC VEIN TRANSPOSITION Left 02/16/2018   Procedure: BRACHIOBASILIC VEIN TRANSPOSITION SECOND STAGE;  Surgeon: Conrad Berkley, MD;  Location: [place];  Service: Vascular;  Laterality: Left;  . CATARACT EXTRACTION W/ INTRAOCULAR LENS  IMPLANT, BILATERAL Bilateral   . ESOPHAGEAL BANDING N/A 04/01/2018   Procedure: ESOPHAGEAL BANDING;  Surgeon: Otis Brace, MD;  Location: [facility];  Service: Gastroenterology;   Laterality: N/A;  . ESOPHAGEAL BANDING N/A 06/29/2018   Procedure: ESOPHAGEAL BANDING;  Surgeon: Otis Brace, MD;  Location: [facility];  Service: Gastroenterology;  Laterality: N/A;  . ESOPHAGOGASTRODUODENOSCOPY (EGD) WITH PROPOFOL N/A 04/01/2018   Procedure: ESOPHAGOGASTRODUODENOSCOPY (EGD) WITH PROPOFOL;  Surgeon: Otis Brace, MD;  Location: [facility];  Service: Gastroenterology;  Laterality: N/A;  . ESOPHAGOGASTRODUODENOSCOPY (EGD) WITH PROPOFOL N/A 06/29/2018   Procedure: ESOPHAGOGASTRODUODENOSCOPY (EGD) WITH PROPOFOL;  Surgeon: Otis Brace, MD;  Location: [facility];  Service: Gastroenterology;  Laterality: N/A;  . ESOPHAGOGASTRODUODENOSCOPY (EGD) WITH PROPOFOL N/A 09/30/2018   Procedure: ESOPHAGOGASTRODUODENOSCOPY (EGD) WITH PROPOFOL;  Surgeon: Otis Brace, MD;  [facility]: [facility];  Service: Gastroenterology;  Laterality: N/A;  . ESOPHAGOGASTRODUODENOSCOPY (EGD) WITH PROPOFOL N/A 04/20/2019   Procedure: ESOPHAGOGASTRODUODENOSCOPY (EGD) WITH PROPOFOL;  Surgeon: Otis Brace, MD;  Location: [facility];  Service: Gastroenterology;  Laterality: N/A;  . ESOPHAGOGASTRODUODENOSCOPY (EGD) WITH PROPOFOL N/A 04/17/2020   Procedure: ESOPHAGOGASTRODUODENOSCOPY (EGD) WITH PROPOFOL;  Surgeon: Wonda Horner, MD;  Location: [place];  Service: Gastroenterology;  Laterality: N/A;  . I & D EXTREMITY Left 02/18/2018   Procedure: IRRIGATION AND DEBRIDEMENT LEFT ARM;  Surgeon: Serafina Mitchell, MD;  Location: [facility];  Service: Vascular;  Laterality: Left;  . INSERTION OF DIALYSIS CATHETER N/A 01/19/2018   Procedure: INSERTION OF DIALYSIS CATHETER - RIGHT INTERNAL JUGULAR PLACEMENT;  Surgeon: Rosetta Posner, MD;  Location: [place];  Service:  Vascular;  Laterality: N/A;  . IR FLUORO GUIDE CV LINE RIGHT  11/10/2017  . IR PARACENTESIS  12/10/2017  . IR PARACENTESIS  12/26/2017  . IR REMOVAL TUN CV CATH W/O FL  11/28/2017  . IR THROMBECTOMY AV FISTULA W/THROMBOLYSIS/PTA  INC/SHUNT/IMG LEFT Left 06/02/2019  . IR US GUIDE VASC ACCESS LEFT  06/02/2019  . IR US GUIDE VASC ACCESS RIGHT  11/10/2017  . NECK SURGERY  2007   "back of neck; no hardware in there"    Short Social History:  Social History       Tobacco Use  . Smoking status: Never Smoker  . Smokeless tobacco: Never Used  Substance Use Topics  . Alcohol use: Never         Allergies  Allergen Reactions  . Heparin Other (See Comments)    Patient is Muslim and is not permitted Pork derative due to religious belief)  . Pork-Derived Products Other (See Comments)    Patient does not eat pork due to religious beliefs          Current Outpatient Medications  Medication Sig Dispense Refill  . calcium acetate (PHOSLO) 667 MG capsule Take 1 capsule (667 mg total) by mouth 3 (three) times daily with meals.    Marland Kitchen glucose blood (ONETOUCH VERIO) test strip USE 1 STRIP TO CHECK GLUCOSE THREE TIMES DAILY. DX:E11.65 150 each 0  . insulin degludec (TRESIBA FLEXTOUCH) 100 UNIT/ML FlexTouch Pen Inject 14 Units into the skin daily. Inject 14 units under the skin once daily. NO FURTHER REFILLS UNTIL PATIENT IS SEEN IN THE OFFICE 2 mL 0  . Insulin Pen Needle (ADVOCATE INSULIN PEN NEEDLES) 31G X 5 MM MISC Use four times daily to inject insulin 150 each 1  . insulin regular (NOVOLIN R,HUMULIN R) 100 units/mL injection Inject 6-8 Units into the skin See admin instructions. INJECT 6 UNITS UNDER THE SKIN AT BREAKFAST, 8 UNITS AT LUNCH AND 8 UNITS AT DINNER.    Marland Kitchen lactulose (CHRONULAC) 10 GM/15ML solution Take 45 mLs (30 g total) by mouth 3 (three) times daily. (Patient not taking: Reported on 07/04/2020) 946 mL 1  . lidocaine-prilocaine (EMLA) cream Apply 1 application topically Every Tuesday,Thursday,and Saturday with dialysis.    . Methoxy PEG-Epoetin Beta (MIRCERA IJ) 1 Dose See admin instructions. Dialysis gives this medication    . Multiple Vitamins-Minerals (ONE-A-DAY MENS 50+ PO) Take 1 tablet  by mouth daily.    . ondansetron (ZOFRAN ODT) 4 MG disintegrating tablet Take 1 tablet (4 mg total) by mouth 2 (two) times daily as needed for nausea or vomiting. 20 tablet 0  . ONE TOUCH LANCETS MISC Use to test blood sugar three times daily 100 each 2  . pantoprazole (PROTONIX) 40 MG tablet Take 1 tablet (40 mg total) by mouth 2 (two) times daily. 30 tablet 1  . rifaximin (XIFAXAN) 550 MG TABS tablet Take 1 tablet (550 mg total) by mouth 2 (two) times daily. 60 tablet 1  . rosuvastatin (CRESTOR) 5 MG tablet Take 1 tablet by mouth once daily (Patient taking differently: Take 5 mg by mouth daily. ) 90 tablet 0   No current facility-administered medications for this visit.    Review of Systems  Constitutional:  Constitutional negative. HENT: HENT negative.  Eyes: Eyes negative.  Respiratory: Respiratory negative.  Cardiovascular: Cardiovascular negative.  GI: Gastrointestinal negative.  Musculoskeletal: Musculoskeletal negative.  Skin: Skin negative.  Neurological: Neurological negative. Hematologic: Hematologic/lymphatic negative.  Psychiatric: Psychiatric negative.  Objective:   Vitals:   11/02/20 0815  Pulse: 89  Resp: 18  Temp: 98.1 F (36.7 C)  SpO2: 100%    Physical Exam HENT:     Nose:     Comments: Wearing a mask Cardiovascular:     Pulses:          Popliteal pulses are 2+ on the right side and 2+ on the left side.       Posterior tibial pulses are 2+ on the right side and 2+ on the left side.  Pulmonary:     Effort: Pulmonary effort is normal.  Abdominal:     General: Abdomen is flat.     Palpations: Abdomen is soft.  Musculoskeletal:     Comments: Fistula has pulsatility in the left upper arm  Skin:    Capillary Refill: Capillary refill takes less than 2 seconds.     Comments: There appears to be chronic ulceration of his right third toe there is a bandage on his left fourth toe  Neurological:     General: No focal deficit present.      Mental Status: He is alert.  Psychiatric:        Mood and Affect: Mood normal.        Thought Content: Thought content normal.        Judgment: Judgment normal.        Assessment/Plan:   70 year old male here for malfunction of left arm fistula.  Fistulogram demonstrated high grade outflow stenosis and inflow stent with stenosis in between 2 pseudoaneuryms. Plan to replace with graft today in OR. Will also place tdc.  Discussed risk benefits and alternatives with the interpreter at bedside and consent was signed.   Elspeth Blucher C. Donzetta Matters, MD [facility] Office: [phone] Pager: [phone]

## 2020-11-02 NOTE — Transfer of Care (Signed)
Immediate Anesthesia Transfer of Care Note  Patient: William [name]  Procedure(s) Performed: INSERTION OF TUNNELED DIALYSIS CATHETER (Right Chest) REVISON OF ARTERIOVENOUS FISTULA (Left Arm Upper) INSERTION OF ARTERIOVENOUS (AV) GORE-TEX GRAFT ARM (Left Arm Upper)  Patient Location: PACU  Anesthesia Type:General  Level of Consciousness: drowsy  Airway & Oxygen Therapy: Patient Spontanous Breathing and Patient connected to face mask oxygen  Post-op Assessment: Report given to RN and Post -op Vital signs reviewed and stable  Post vital signs: Reviewed and stable  Last Vitals:  Vitals Value Taken Time  BP 145/60 11/02/20 1145  Temp 36 C 11/02/20 1145  Pulse 82 11/02/20 1148  Resp 8 11/02/20 1148  SpO2 100 % 11/02/20 1148  Vitals shown include unvalidated device data.  Last Pain:  Vitals:   11/02/20 0754  PainSc: 0-No pain         Complications: No complications documented.

## 2020-11-02 NOTE — Op Note (Signed)
Patient name: William [name] MRN: [id number] DOB: 1950-10-05 Sex: male  11/02/2020 Pre-operative Diagnosis: End-stage renal disease Post-operative diagnosis:  Same Surgeon:  Erlene Quan C. Donzetta Matters, MD Assistant: Ethelene Hal, MS 3 Procedure Performed: 1.  Right IJ placement of 19 cm tunneled dialysis catheter with ultrasound and fluoroscopic guidance 2.  Revision of left arm AV fistula with interposition 7 mm PTFE graft  Indications: 70 year old male with history of end-stage renal disease currently dialyzing via left upper extremity fistula.  Fistula has 2 areas of pseudoaneurysmal degeneration with narrowing between it.  There is also high-grade stenosis in the outflow and there is a stent of the inflow.  Due to all these issues we have elected to replace with interposition graft and also place a catheter at the time of surgery for the patient to get immediate dialysis.  Findings: The right IJ did appear somewhat sclerotic.  We were able to put a 19 cm total dialysis catheter using ultrasound fluoroscopic guidance to the SVC atrial junction.  The fistula in the axilla had one area of focal sclerosis we opened this up and spatulated the fistula and sewed 7 mm graft end to end.  Towards the antecubitum there was an area of stent which we transected and then removed the stent towards the antecubitum and had very strong inflow.  At completion there was a very strong thrill in the graft and a palpable radial artery pulse at the wrist confirmed with Doppler.   Procedure:  The patient was identified in the holding area and taken to the operating was placed supine on operative table and general anesthesia was induced.  He was sterilely prepped and draped in the right neck and chest as well as left upper extremity in the usual fashion.  Antibiotics were administered timeout was called.  Ultrasound was used and to identify the right IJ which was noted to be somewhat sclerotic.  We cannulated with 18-gauge  needle and passed a wire centrally under fluoroscopic guidance.  We then tunneled a 19 cm catheter from lateral incision.  The wire tract was serially dilated introducer sheath was placed under fluoroscopic guidance.  The catheter was placed to the SVC atrial junction.  It was then affixed to the skin with 3-0 nylon suture and the neck incision was closed with 4 Monocryl and Dermabond was placed at both sites.  The catheter was flushed with saline and then locked with citrated sodium given that patient has a heparin allergy.  Attention was then turned to the left upper extremity.  Ultrasound was used to identify what appeared to be the sclerotic area in the axilla and the previous incision was marked on the skin.  Towards the antecubitum we identified where the stent was we marked this on the skin.  We then opened the axillary incision using 10 blade carried down through skin subcutaneous tissue using cautery to identify the fistula itself.  We were able to dissect this out protecting the nerve throughout its course.  We marked it for orientation.  We turned our attention towards the other incision which was made with 10 blade as well.  We dissected down around the fistula there was very strong inflow there.  I could actually see the stent through the fistula itself.  We placed a vessel loop around the fistula.  We then clamped the fistula near the antecubitum and clamped it in the axilla.  We transected near the axilla there was 1 area of very narrow fistula we spatulated  this.  The fistula more distally was tied off.  We tell us of millimeter graft.  This was sewn end-to-end to the graft to the fistula in the axilla.  We then flushed the graft with saline.  The graft was clamped.  The fistula near the antecubitum was then transected and tied off distally with 2-0 silk suture.  There was residual stent or I transected it I removed all of this as well as significant hyperplastic tissue.  There was very strong  inflow.  I then trimmed the graft to size and sewed end-to-end with 6-0 Prolene suture.  Prior completion allowed flushing all directions.  I then thoroughly flushed the graft with saline.  We then allowed all of our clamps off.  There is a very strong thrill in the graft itself.  There is a palpable radial artery pulse the wrist.  We obtain hemostasis in thoroughly irrigated.  We then closed in layers with Vicryl and Monocryl.  Dermabond is placed to the level of skin.  The left upper arm was gently wrapped with an Ace wrap given the extent of the surgery with oozing.  He did have a palpable radial artery pulse after this.  He was then awakened from anesthesia having tolerated procedure well without immediate complication.  All counts were correct at completion.  EBL: 100 cc   Kazuma Elena C. Donzetta Matters, MD [facility] Office: [phone] Pager: [phone]

## 2020-11-02 NOTE — Anesthesia Procedure Notes (Signed)
Procedure Name: Intubation Date/Time: 11/02/2020 9:35 AM Performed by: Hoy Morn, CRNA Pre-anesthesia Checklist: Patient identified, Emergency Drugs available, Suction available and Patient being monitored Patient Re-evaluated:Patient Re-evaluated prior to induction Oxygen Delivery Method: Circle system utilized Preoxygenation: Pre-oxygenation with 100% oxygen Induction Type: IV induction Ventilation: Mask ventilation without difficulty Laryngoscope Size: Miller and 2 Grade View: Grade I Tube type: Oral Tube size: 7.5 mm Number of attempts: 1 Airway Equipment and Method: Stylet and Oral airway Placement Confirmation: ETT inserted through vocal cords under direct vision,  positive ETCO2 and breath sounds checked- equal and bilateral Secured at: 24 cm Tube secured with: Tape Dental Injury: Teeth and Oropharynx as per pre-operative assessment

## 2020-11-02 NOTE — Anesthesia Preprocedure Evaluation (Addendum)
Anesthesia Evaluation  Patient identified by MRN, date of birth, ID band Patient awake    Reviewed: Allergy & Precautions, NPO status , Patient's Chart, lab work & pertinent test results  Airway Mallampati: III  TM Distance: >3 FB Neck ROM: Full    Dental  (+) Missing   Pulmonary neg pulmonary ROS,    Pulmonary exam normal breath sounds clear to auscultation       Cardiovascular hypertension, +CHF  Normal cardiovascular exam Rhythm:Regular Rate:Normal  ECG: a-flutter, rate 90   Neuro/Psych negative neurological ROS  negative psych ROS   GI/Hepatic negative GI ROS, (+) Cirrhosis   Esophageal Varices    ,   Endo/Other  diabetes, Insulin Dependent  Renal/GU ESRF and DialysisRenal diseaseOn HD T, R, Sat     Musculoskeletal negative musculoskeletal ROS (+)   Abdominal   Peds  Hematology  (+) anemia , HLD   Anesthesia Other Findings ESRD MALFUNCTION OF LEFT ARM AVF  Reproductive/Obstetrics                           Anesthesia Physical Anesthesia Plan  ASA: III  Anesthesia Plan: General   Post-op Pain Management:    Induction: Intravenous  PONV Risk Score and Plan: 2 and Ondansetron, Dexamethasone and Treatment may vary due to age or medical condition  Airway Management Planned: Oral ETT  Additional Equipment:   Intra-op Plan:   Post-operative Plan: Extubation in OR  Informed Consent: I have reviewed the patients History and Physical, chart, labs and discussed the procedure including the risks, benefits and alternatives for the proposed anesthesia with the patient or authorized representative who has indicated his/her understanding and acceptance.     Dental advisory given and Interpreter used for interveiw  Plan Discussed with: CRNA  Anesthesia Plan Comments:       Anesthesia Quick Evaluation

## 2020-11-03 ENCOUNTER — Other Ambulatory Visit: Payer: Self-pay

## 2020-11-03 ENCOUNTER — Inpatient Hospital Stay (HOSPITAL_COMMUNITY)
Admission: EM | Admit: 2020-11-03 | Discharge: 2020-11-05 | DRG: 981 | Disposition: A | Discharge status: Home / Self Care | Payer: Medicare Other | Source: Ambulatory Visit | Attending: Internal Medicine | Admitting: Internal Medicine

## 2020-11-03 ENCOUNTER — Inpatient Hospital Stay (HOSPITAL_COMMUNITY): Payer: Medicare Other

## 2020-11-03 ENCOUNTER — Emergency Department (HOSPITAL_COMMUNITY): Payer: Medicare Other

## 2020-11-03 ENCOUNTER — Encounter (HOSPITAL_COMMUNITY): Payer: Self-pay | Admitting: Vascular Surgery

## 2020-11-03 DIAGNOSIS — I132 Hypertensive heart and chronic kidney disease with heart failure and with stage 5 chronic kidney disease, or end stage renal disease: Secondary | ICD-10-CM | POA: Diagnosis present

## 2020-11-03 DIAGNOSIS — R579 Shock, unspecified: Secondary | ICD-10-CM | POA: Diagnosis not present

## 2020-11-03 DIAGNOSIS — I959 Hypotension, unspecified: Secondary | ICD-10-CM | POA: Diagnosis present

## 2020-11-03 DIAGNOSIS — K760 Fatty (change of) liver, not elsewhere classified: Secondary | ICD-10-CM | POA: Diagnosis present

## 2020-11-03 DIAGNOSIS — N186 End stage renal disease: Secondary | ICD-10-CM | POA: Diagnosis present

## 2020-11-03 DIAGNOSIS — R410 Disorientation, unspecified: Secondary | ICD-10-CM | POA: Diagnosis present

## 2020-11-03 DIAGNOSIS — T4275XA Adverse effect of unspecified antiepileptic and sedative-hypnotic drugs, initial encounter: Secondary | ICD-10-CM | POA: Diagnosis not present

## 2020-11-03 DIAGNOSIS — M310 Hypersensitivity angiitis: Secondary | ICD-10-CM | POA: Diagnosis not present

## 2020-11-03 DIAGNOSIS — I10 Essential (primary) hypertension: Secondary | ICD-10-CM | POA: Diagnosis not present

## 2020-11-03 DIAGNOSIS — G928 Other toxic encephalopathy: Principal | ICD-10-CM | POA: Diagnosis present

## 2020-11-03 DIAGNOSIS — E1129 Type 2 diabetes mellitus with other diabetic kidney complication: Secondary | ICD-10-CM | POA: Diagnosis not present

## 2020-11-03 DIAGNOSIS — E1122 Type 2 diabetes mellitus with diabetic chronic kidney disease: Secondary | ICD-10-CM | POA: Diagnosis not present

## 2020-11-03 DIAGNOSIS — T82510A Breakdown (mechanical) of surgically created arteriovenous fistula, initial encounter: Secondary | ICD-10-CM | POA: Diagnosis present

## 2020-11-03 DIAGNOSIS — R4182 Altered mental status, unspecified: Secondary | ICD-10-CM | POA: Diagnosis present

## 2020-11-03 DIAGNOSIS — Z79899 Other long term (current) drug therapy: Secondary | ICD-10-CM | POA: Diagnosis not present

## 2020-11-03 DIAGNOSIS — N170 Acute kidney failure with tubular necrosis: Secondary | ICD-10-CM | POA: Diagnosis present

## 2020-11-03 DIAGNOSIS — Y828 Other medical devices associated with adverse incidents: Secondary | ICD-10-CM | POA: Diagnosis present

## 2020-11-03 DIAGNOSIS — K7460 Unspecified cirrhosis of liver: Secondary | ICD-10-CM | POA: Diagnosis present

## 2020-11-03 DIAGNOSIS — Z794 Long term (current) use of insulin: Secondary | ICD-10-CM | POA: Diagnosis not present

## 2020-11-03 DIAGNOSIS — R3129 Other microscopic hematuria: Secondary | ICD-10-CM | POA: Insufficient documentation

## 2020-11-03 DIAGNOSIS — Z992 Dependence on renal dialysis: Secondary | ICD-10-CM

## 2020-11-03 DIAGNOSIS — Z8249 Family history of ischemic heart disease and other diseases of the circulatory system: Secondary | ICD-10-CM

## 2020-11-03 DIAGNOSIS — I5032 Chronic diastolic (congestive) heart failure: Secondary | ICD-10-CM | POA: Diagnosis not present

## 2020-11-03 DIAGNOSIS — N250 Renal osteodystrophy: Secondary | ICD-10-CM | POA: Diagnosis not present

## 2020-11-03 DIAGNOSIS — E872 Acidosis: Secondary | ICD-10-CM | POA: Diagnosis not present

## 2020-11-03 DIAGNOSIS — N2581 Secondary hyperparathyroidism of renal origin: Secondary | ICD-10-CM | POA: Diagnosis not present

## 2020-11-03 DIAGNOSIS — T82858A Stenosis of vascular prosthetic devices, implants and grafts, initial encounter: Secondary | ICD-10-CM | POA: Diagnosis present

## 2020-11-03 DIAGNOSIS — D631 Anemia in chronic kidney disease: Secondary | ICD-10-CM | POA: Diagnosis not present

## 2020-11-03 DIAGNOSIS — R509 Fever, unspecified: Secondary | ICD-10-CM

## 2020-11-03 DIAGNOSIS — R6521 Severe sepsis with septic shock: Secondary | ICD-10-CM | POA: Diagnosis not present

## 2020-11-03 DIAGNOSIS — Z20822 Contact with and (suspected) exposure to covid-19: Secondary | ICD-10-CM | POA: Diagnosis not present

## 2020-11-03 DIAGNOSIS — F05 Delirium due to known physiological condition: Secondary | ICD-10-CM | POA: Insufficient documentation

## 2020-11-03 DIAGNOSIS — G9340 Encephalopathy, unspecified: Secondary | ICD-10-CM | POA: Diagnosis not present

## 2020-11-03 DIAGNOSIS — Z833 Family history of diabetes mellitus: Secondary | ICD-10-CM

## 2020-11-03 DIAGNOSIS — R059 Cough, unspecified: Secondary | ICD-10-CM | POA: Diagnosis not present

## 2020-11-03 DIAGNOSIS — Z888 Allergy status to other drugs, medicaments and biological substances status: Secondary | ICD-10-CM

## 2020-11-03 DIAGNOSIS — I5030 Unspecified diastolic (congestive) heart failure: Secondary | ICD-10-CM | POA: Diagnosis not present

## 2020-11-03 DIAGNOSIS — D509 Iron deficiency anemia, unspecified: Secondary | ICD-10-CM | POA: Diagnosis not present

## 2020-11-03 DIAGNOSIS — D649 Anemia, unspecified: Secondary | ICD-10-CM | POA: Diagnosis not present

## 2020-11-03 DIAGNOSIS — J9811 Atelectasis: Secondary | ICD-10-CM | POA: Diagnosis not present

## 2020-11-03 LAB — COMPREHENSIVE METABOLIC PANEL
ALT: 9 U/L (ref 0–44)
AST: 35 U/L (ref 15–41)
Albumin: 2.5 g/dL — ABNORMAL LOW (ref 3.5–5.0)
Alkaline Phosphatase: 130 U/L — ABNORMAL HIGH (ref 38–126)
Anion gap: 11 (ref 5–15)
BUN: 19 mg/dL (ref 8–23)
CO2: 27 mmol/L (ref 22–32)
Calcium: 7.6 mg/dL — ABNORMAL LOW (ref 8.9–10.3)
Chloride: 97 mmol/L — ABNORMAL LOW (ref 98–111)
Creatinine, Ser: 3.74 mg/dL — ABNORMAL HIGH (ref 0.61–1.24)
GFR, Estimated: 17 mL/min — ABNORMAL LOW (ref >60)
Glucose, Bld: 161 mg/dL — ABNORMAL HIGH (ref 70–99)
Potassium: 4 mmol/L (ref 3.5–5.1)
Sodium: 135 mmol/L (ref 135–145)
Total Bilirubin: 1.9 mg/dL — ABNORMAL HIGH (ref 0.3–1.2)
Total Protein: 6.2 g/dL — ABNORMAL LOW (ref 6.5–8.1)

## 2020-11-03 LAB — RAPID URINE DRUG SCREEN, HOSP PERFORMED
Amphetamines: NOT DETECTED
Barbiturates: NOT DETECTED
Benzodiazepines: NOT DETECTED
Cocaine: NOT DETECTED
Opiates: NOT DETECTED
Tetrahydrocannabinol: NOT DETECTED

## 2020-11-03 LAB — CBC WITH DIFFERENTIAL/PLATELET
Abs Immature Granulocytes: 0.01 10*3/uL (ref 0.00–0.07)
Basophils Absolute: 0 10*3/uL (ref 0.0–0.1)
Basophils Relative: 0 %
Eosinophils Absolute: 0.1 10*3/uL (ref 0.0–0.5)
Eosinophils Relative: 1 %
HCT: 32.5 % — ABNORMAL LOW (ref 39.0–52.0)
Hemoglobin: 10.4 g/dL — ABNORMAL LOW (ref 13.0–17.0)
Immature Granulocytes: 0 %
Lymphocytes Relative: 22 %
Lymphs Abs: 1.1 10*3/uL (ref 0.7–4.0)
MCH: 26.9 pg (ref 26.0–34.0)
MCHC: 32 g/dL (ref 30.0–36.0)
MCV: 84.2 fL (ref 80.0–100.0)
Monocytes Absolute: 0.8 10*3/uL (ref 0.1–1.0)
Monocytes Relative: 16 %
Neutro Abs: 3.1 10*3/uL (ref 1.7–7.7)
Neutrophils Relative %: 61 %
Platelets: 54 10*3/uL — ABNORMAL LOW (ref 150–400)
RBC: 3.86 MIL/uL — ABNORMAL LOW (ref 4.22–5.81)
RDW: 15.1 % (ref 11.5–15.5)
WBC: 5.2 10*3/uL (ref 4.0–10.5)
nRBC: 0 % (ref 0.0–0.2)

## 2020-11-03 LAB — CBG MONITORING, ED
Glucose-Capillary: 170 mg/dL — ABNORMAL HIGH (ref 70–99)
Glucose-Capillary: 172 mg/dL — ABNORMAL HIGH (ref 70–99)

## 2020-11-03 LAB — AMMONIA: Ammonia: 29 umol/L (ref 9–35)

## 2020-11-03 LAB — I-STAT CHEM 8, ED
BUN: 20 mg/dL (ref 8–23)
Calcium, Ion: 0.82 mmol/L — CL (ref 1.15–1.40)
Chloride: 98 mmol/L (ref 98–111)
Creatinine, Ser: 3.6 mg/dL — ABNORMAL HIGH (ref 0.61–1.24)
Glucose, Bld: 155 mg/dL — ABNORMAL HIGH (ref 70–99)
HCT: 30 % — ABNORMAL LOW (ref 39.0–52.0)
Hemoglobin: 10.2 g/dL — ABNORMAL LOW (ref 13.0–17.0)
Potassium: 4 mmol/L (ref 3.5–5.1)
Sodium: 135 mmol/L (ref 135–145)
TCO2: 27 mmol/L (ref 22–32)

## 2020-11-03 LAB — LACTIC ACID, PLASMA
Lactic Acid, Venous: 2.6 mmol/L (ref 0.5–1.9)
Lactic Acid, Venous: 2.6 mmol/L (ref 0.5–1.9)

## 2020-11-03 LAB — URINALYSIS, ROUTINE W REFLEX MICROSCOPIC
Bacteria, UA: NONE SEEN
Bilirubin Urine: NEGATIVE
Glucose, UA: 50 mg/dL — AB
Ketones, ur: NEGATIVE mg/dL
Leukocytes,Ua: NEGATIVE
Nitrite: NEGATIVE
Protein, ur: 100 mg/dL — AB
Specific Gravity, Urine: 1.015 (ref 1.005–1.030)
pH: 7 (ref 5.0–8.0)

## 2020-11-03 LAB — SALICYLATE LEVEL: Salicylate Lvl: <7 mg/dL — ABNORMAL LOW (ref 7.0–30.0)

## 2020-11-03 LAB — ETHANOL: Alcohol, Ethyl (B): <10 mg/dL (ref <10)

## 2020-11-03 LAB — ACETAMINOPHEN LEVEL: Acetaminophen (Tylenol), Serum: <10 ug/mL — ABNORMAL LOW (ref 10–30)

## 2020-11-03 LAB — SARS CORONAVIRUS 2 (TAT 6-24 HRS): SARS Coronavirus 2: NEGATIVE

## 2020-11-03 MED ORDER — LACTULOSE 10 GM/15ML PO SOLN
30.0000 g | Freq: Three times a day (TID) | ORAL | Status: DC
  Administered 2020-11-04 – 2020-11-05 (×2): 30 g via ORAL
  Filled 2020-11-03 (×4): qty 45

## 2020-11-03 MED ORDER — LACTATED RINGERS IV BOLUS
500.0000 mL | Freq: Once | INTRAVENOUS | Status: AC
  Administered 2020-11-03: 500 mL via INTRAVENOUS

## 2020-11-03 MED ORDER — ADULT MULTIVITAMIN W/MINERALS CH
1.0000 | ORAL_TABLET | Freq: Every day | ORAL | Status: DC
  Administered 2020-11-04 – 2020-11-05 (×2): 1 via ORAL
  Filled 2020-11-03 (×2): qty 1

## 2020-11-03 MED ORDER — SODIUM CHLORIDE 0.9 % IV SOLN
2.0000 g | Freq: Once | INTRAVENOUS | Status: AC
  Administered 2020-11-03: 2 g via INTRAVENOUS
  Filled 2020-11-03: qty 2

## 2020-11-03 MED ORDER — ACETAMINOPHEN 650 MG RE SUPP: 650.0000 mg | Freq: Four times a day (QID) | RECTAL | Status: DC | PRN

## 2020-11-03 MED ORDER — VANCOMYCIN HCL 1500 MG/300ML IV SOLN
1500.0000 mg | Freq: Once | INTRAVENOUS | Status: AC
  Administered 2020-11-03: 1500 mg via INTRAVENOUS
  Filled 2020-11-03: qty 300

## 2020-11-03 MED ORDER — VANCOMYCIN HCL 750 MG/150ML IV SOLN
750.0000 mg | INTRAVENOUS | Status: DC
  Administered 2020-11-04: 750 mg via INTRAVENOUS
  Filled 2020-11-03 (×2): qty 150

## 2020-11-03 MED ORDER — SEVELAMER CARBONATE 800 MG PO TABS
800.0000 mg | ORAL_TABLET | Freq: Three times a day (TID) | ORAL | Status: DC
  Administered 2020-11-04 – 2020-11-05 (×4): 800 mg via ORAL
  Filled 2020-11-03 (×4): qty 1

## 2020-11-03 MED ORDER — ROSUVASTATIN CALCIUM 5 MG PO TABS
5.0000 mg | ORAL_TABLET | Freq: Every day | ORAL | Status: DC
  Administered 2020-11-03 – 2020-11-05 (×3): 5 mg via ORAL
  Filled 2020-11-03 (×3): qty 1

## 2020-11-03 MED ORDER — SODIUM CHLORIDE 0.9 % IV SOLN
1.0000 g | INTRAVENOUS | Status: DC
  Administered 2020-11-04: 1 g via INTRAVENOUS
  Filled 2020-11-03 (×2): qty 1

## 2020-11-03 MED ORDER — RIFAXIMIN 550 MG PO TABS
550.0000 mg | ORAL_TABLET | Freq: Two times a day (BID) | ORAL | Status: DC
  Administered 2020-11-03 – 2020-11-05 (×4): 550 mg via ORAL
  Filled 2020-11-03 (×5): qty 1

## 2020-11-03 MED ORDER — INSULIN DETEMIR 100 UNIT/ML ~~LOC~~ SOLN
8.0000 [IU] | Freq: Every day | SUBCUTANEOUS | Status: DC
  Filled 2020-11-03 (×2): qty 0.08

## 2020-11-03 MED ORDER — CALCIUM ACETATE (PHOS BINDER) 667 MG PO CAPS
667.0000 mg | ORAL_CAPSULE | Freq: Three times a day (TID) | ORAL | Status: DC
  Administered 2020-11-04 – 2020-11-05 (×4): 667 mg via ORAL
  Filled 2020-11-03 (×4): qty 1

## 2020-11-03 MED ORDER — ACETAMINOPHEN 325 MG PO TABS: 650.0000 mg | ORAL_TABLET | Freq: Four times a day (QID) | ORAL | Status: DC | PRN

## 2020-11-03 MED ORDER — INSULIN ASPART 100 UNIT/ML ~~LOC~~ SOLN
0.0000 [IU] | Freq: Three times a day (TID) | SUBCUTANEOUS | Status: DC
  Administered 2020-11-04: 5 [IU] via SUBCUTANEOUS
  Administered 2020-11-04: 8 [IU] via SUBCUTANEOUS
  Administered 2020-11-05: 11 [IU] via SUBCUTANEOUS

## 2020-11-03 NOTE — ED Notes (Signed)
Vanc paused so pt could go to MRI. 79 mL left. This RN will restart Vanc when pt returns.

## 2020-11-03 NOTE — ED Notes (Signed)
Lab in to draw blood.

## 2020-11-03 NOTE — Hospital Course (Addendum)
Admitted 11/03/2020  Allergies: Heparin and Pork-derived products Pertinent Hx: ESRD on HD TRS, ***  70 y.o. male p/w AMS  * AMS: Started after procedure, ***  Consults: None  Meds: Vanc, cefepime, *** VTE ppx: SCDs IVF: NS Diet: Renal    After procedure the son states that he was having a little confusion. He did not use the restroom yesterday, did not have any bowel movements. He got his lactulose and still hasn't had a bowel movement. He was surprised that the ammonia was normal.  No fevers, chills, sick contacts. COVID was negative on Wednesday.  He has been shaking, but this has improved since he has been here. Was shaking before when he had liver issues.  Denied any chest pain, shortness of breath, cough.  Had some swelling in his arm.  Denied any  Hasn't taken percocet  Missed Thursday due to procedure, went to dialysis today.  Reports a chronic cough.  Lives with son. Denies EtOH, smoking, or recreational drug use.

## 2020-11-03 NOTE — ED Notes (Addendum)
Critical lactic value reported to [place]. Orders for cultures to be placed.

## 2020-11-03 NOTE — ED Notes (Signed)
Pt is in MRI. Will obtain vitals when pt returns.

## 2020-11-03 NOTE — Anesthesia Postprocedure Evaluation (Signed)
Anesthesia Post Note  Patient: William [name]  Procedure(s) Performed: INSERTION OF TUNNELED DIALYSIS CATHETER (Right Chest) REVISON OF ARTERIOVENOUS FISTULA (Left Arm Upper) INSERTION OF ARTERIOVENOUS (AV) GORE-TEX GRAFT ARM (Left Arm Upper)     Patient location during evaluation: PACU Anesthesia Type: General Level of consciousness: awake Pain management: pain level controlled Vital Signs Assessment: post-procedure vital signs reviewed and stable Respiratory status: spontaneous breathing, nonlabored ventilation, respiratory function stable and patient connected to nasal cannula oxygen Cardiovascular status: blood pressure returned to baseline and stable Postop Assessment: no apparent nausea or vomiting Anesthetic complications: no   No complications documented.  Last Vitals:  Vitals:   11/02/20 1200 11/02/20 1230  BP: (!) 130/56 (!) 123/55  Pulse: 81 79  Resp: 10 12  Temp:  36.7 C  SpO2: 100% 94%    Last Pain:  Vitals:   11/02/20 1230  PainSc: 0-No pain                 Dontre Laduca P Delores Thelen

## 2020-11-03 NOTE — ED Provider Notes (Signed)
[facility] EMERGENCY DEPARTMENT Provider Note   CSN: [id number] Arrival date & time: 11/03/20  1259     History Chief Complaint  Patient presents with  . Altered Mental Status  . Hypertension    EMS was called out for HTN from dialysis unit and also he came in today to the dialysis clinic "not himself" and using a wheel chair, no family with pt, pt speaks arabic only    William [name] is a 71 y.o. male past medical history of CHF, cirrhosis, ESRD, Goodpasture's, anemia, hypertension, diabetes brought in by EMS for evaluation of altered mental status.  Per EMS, patient was at dialysis.  He was noted to have high blood pressure and then was not acting like himself prompting EMS call.  I discussed with the son.  He states that yesterday, patient was seen to get William dialysis catheter fixed.  He picked him up afterwards in the afternoon and states he was acting a little slower than normal but son thought it was left over from the anesthesia. He reports that since last night, he has not had any urine output.  He states that this is abnormal for him. He tried to give him lactulose last night for confusion but states that patient did not have any bowel movement after. Today, he noticed that when he woke up, he was slower to respond and seemed slightly confused.  He took him to dialysis and left him there to complete it.  He reports that he was called and told that William [name] was confused and was taken to the emergency department.  He states normally, he is alert and oriented x4.  No falls that he knows of.  EM LEVEL 5 CAVEAT DUE TO AMS  The history is provided by a relative and the EMS personnel.       Past Medical History:  Diagnosis Date  . Anemia   . Blind one eye   . CHF (congestive heart failure) ([place])   . Cirrhosis ([place])   . ESRD on hemodialysis ([place])   . Goodpasture's disease [facility])    on outpatient plasmapheresis/notes 11/20/2017  . Grade I diastolic dysfunction  61/60/7371  . History of anemia due to chronic kidney disease   . History of blood transfusion X 1   "UGIB; low blood count"  . History of plasmapheresis    "qod" (11/20/2017)  . Hypertension   . Pancytopenia ([place]) 08/20/2017  . Type II diabetes mellitus [facility])     Patient Active Problem List   Diagnosis Date Noted  . Diabetic foot infection ([place]) 10/06/2020  . Gangrene of toe of left foot ([place])   . Goals of care, counseling/discussion   . Palliative care by specialist   . AV fistula thrombosis, initial encounter ([place]) 06/01/2019  . AV fistula occlusion, initial encounter ([place])   . Chronic systolic CHF (congestive heart failure) ([place])   . Esophageal varices ([place]) 09/23/2018  . Thrombocytopenia ([place]) 09/23/2018  . CHF (congestive heart failure) ([place]) 09/22/2018  . ESRD on dialysis ([place]) 02/06/2018  . CKD (chronic kidney disease) stage 4, GFR 15-29 ml/min (H[facility]) 12/19/2017  . Cirrhosis of liver with ascites ([place]) 12/09/2017  . Goodpasture's syndrome ([place]) with associated hemoptysis 11/20/2017  . Pancytopenia ([place]) 11/06/2017  . Essential hypertension 11/06/2017  . Type II diabetes mellitus with renal manifestations ([place]) 08/25/2015    Past Surgical History:  Procedure Laterality Date  . A/V FISTULAGRAM Left 10/16/2020   Procedure: A/V FISTULAGRAM;  Surgeon: Donzetta Matters,  Georgia Dom, MD;  Location: [facility] CV LAB;  Service: Cardiovascular;  Laterality: Left;  . AMPUTATION TOE Left 10/07/2020   Procedure: AMPUTATION THIRD TOE;  Surgeon: Criselda Peaches, DPM;  Location: [facility];  Service: Podiatry;  Laterality: Left;  . AV FISTULA PLACEMENT Left 12/26/2017   Procedure: LEFT BRACHIOCEPHALIC ARTERIOVENOUS (AV) FISTULA CREATION;  Surgeon: Conrad Be[place] Creek, MD;  Location: [facility];  Service: Vascular;  Laterality: Left;  . AV FISTULA PLACEMENT Left 11/02/2020   Procedure: INSERTION OF ARTERIOVENOUS (AV) GORE-TEX GRAFT ARM;  Surgeon: Waynetta Sandy, MD;  Location: [facility];  Service:  Vascular;  Laterality: Left;  . BASCILIC VEIN TRANSPOSITION Left 02/16/2018   Procedure: BRACHIOBASILIC VEIN TRANSPOSITION SECOND STAGE;  Surgeon: Conrad Uhland, MD;  Location: [facility];  Service: Vascular;  Laterality: Left;  . CATARACT EXTRACTION W/ INTRAOCULAR LENS  IMPLANT, BILATERAL Bilateral   . ESOPHAGEAL BANDING N/A 04/01/2018   Procedure: ESOPHAGEAL BANDING;  Surgeon: Otis Brace, MD;  Location: [facility];  Service: Gastroenterology;  Laterality: N/A;  . ESOPHAGEAL BANDING N/A 06/29/2018   Procedure: ESOPHAGEAL BANDING;  Surgeon: Otis Brace, MD;  Location: [facility];  Service: Gastroenterology;  Laterality: N/A;  . ESOPHAGOGASTRODUODENOSCOPY (EGD) WITH PROPOFOL N/A 04/01/2018   Procedure: ESOPHAGOGASTRODUODENOSCOPY (EGD) WITH PROPOFOL;  Surgeon: Otis Brace, MD;  [facility];  Service: Gastroenterology;  Laterality: N/A;  . ESOPHAGOGASTRODUODENOSCOPY (EGD) WITH PROPOFOL N/A 06/29/2018   Procedure: ESOPHAGOGASTRODUODENOSCOPY (EGD) WITH PROPOFOL;  Surgeon: Otis Brace, MD;  [facility];  Service: Gastroenterology;  Laterality: N/A;  . ESOPHAGOGASTRODUODENOSCOPY (EGD) WITH PROPOFOL N/A 09/30/2018   Procedure: ESOPHAGOGASTRODUODENOSCOPY (EGD) WITH PROPOFOL;  Surgeon: Otis Brace, MD;  [facility];  Service: Gastroenterology;  Laterality: N/A;  . ESOPHAGOGASTRODUODENOSCOPY (EGD) WITH PROPOFOL N/A 04/20/2019   Procedure: ESOPHAGOGASTRODUODENOSCOPY (EGD) WITH PROPOFOL;  Surgeon: Otis Brace, MD;  [facility];  Service: Gastroenterology;  Laterality: N/A;  . ESOPHAGOGASTRODUODENOSCOPY (EGD) WITH PROPOFOL N/A 04/17/2020   Procedure: ESOPHAGOGASTRODUODENOSCOPY (EGD) WITH PROPOFOL;  Surgeon: Wonda Horner, MD;  Location: [facility];  Service: Gastroenterology;  Laterality: N/A;  . I & D EXTREMITY Left 02/18/2018   Procedure: IRRIGATION AND DEBRIDEMENT LEFT ARM;  Surgeon: Serafina Mitchell, MD;  Location: [facility];  Service:  Vascular;  Laterality: Left;  . INSERTION OF DIALYSIS CATHETER N/A 01/19/2018   Procedure: INSERTION OF DIALYSIS CATHETER - RIGHT INTERNAL JUGULAR PLACEMENT;  Surgeon: Rosetta Posner, MD;  Location: [facility];  Service: Vascular;  Laterality: N/A;  . INSERTION OF DIALYSIS CATHETER Right 11/02/2020   Procedure: INSERTION OF TUNNELED DIALYSIS CATHETER;  Surgeon: Waynetta Sandy, MD;  Location: [facility];  Service: Vascular;  Laterality: Right;  . IR FLUORO GUIDE CV LINE RIGHT  11/10/2017  . IR PARACENTESIS  12/10/2017  . IR PARACENTESIS  12/26/2017  . IR REMOVAL TUN CV CATH W/O FL  11/28/2017  . IR THROMBECTOMY AV FISTULA W/THROMBOLYSIS/PTA INC/SHUNT/IMG LEFT Left 06/02/2019  . IR US GUIDE VASC ACCESS LEFT  06/02/2019  . IR US GUIDE VASC ACCESS RIGHT  11/10/2017  . NECK SURGERY  2007   "back of neck; no hardware in there"  . REVISON OF ARTERIOVENOUS FISTULA Left 11/02/2020   Procedure: REVISON OF ARTERIOVENOUS FISTULA;  Surgeon: Waynetta Sandy, MD;  Location: [facility] OR;  Service: Vascular;  Laterality: Left;       Family History  Problem Relation Age of Onset  . Diabetes Mellitus II Sister   . Diabetes Sister   . Stroke Brother   . Heart  attack Brother     Social History   Tobacco Use  . Smoking status: Never Smoker  . Smokeless tobacco: Never Used  Vaping Use  . Vaping Use: Never used  Substance Use Topics  . Alcohol use: Never  . Drug use: Never    Home Medications Prior to Admission medications   Medication Sig Start Date End Date Taking? Authorizing Provider  calcium acetate (PHOSLO) 667 MG capsule Take 1 capsule (667 mg total) by mouth 3 (three) times daily with meals. 05/11/20   Aline August, MD  glucose blood (ONETOUCH VERIO) test strip USE 1 STRIP TO CHECK GLUCOSE THREE TIMES DAILY. DX:E11.65 10/18/20   Elayne Snare, MD  insulin degludec (TRESIBA FLEXTOUCH) 100 UNIT/ML FlexTouch Pen Inject 14 Units into the skin daily. Inject 14 units under the skin once daily. NO  FURTHER REFILLS UNTIL PATIENT IS SEEN IN THE OFFICE Patient taking differently: Inject 12 Units into the skin daily after breakfast. NO FURTHER REFILLS UNTIL PATIENT IS SEEN IN THE OFFICE 09/04/20   Elayne Snare, MD  Insulin Pen Needle (ADVOCATE INSULIN PEN NEEDLES) 31G X 5 MM MISC Use four times daily to inject insulin 04/27/18   Elayne Snare, MD  insulin regular (NOVOLIN R,HUMULIN R) 100 units/mL injection Inject 6-8 Units into the skin See admin instructions. Inject 8 units subcutaneously with breakfast, and 6 units with lunch and supper    [provider]  lactulose (CHRONULAC) 10 GM/15ML solution Take 45 mLs (30 g total) by mouth 3 (three) times daily. 05/11/20   Aline August, MD  lidocaine-prilocaine (EMLA) cream Apply 1 application topically See admin instructions. Apply topically prior to dialysis on Tuesday, Thursday, Saturday 08/24/18   [provider]  Methoxy PEG-Epoetin Beta (MIRCERA IJ) 1 Dose See admin instructions. Dialysis gives this medication 05/09/20 05/08/21  [provider]  Multiple Vitamin (MULTIVITAMIN WITH MINERALS) TABS tablet Take 1 tablet by mouth daily.    [provider]  mupirocin ointment (BACTROBAN) 2 % Apply 1 application topically daily. 10/26/20   McDonald, Stephan Minister, DPM  ondansetron (ZOFRAN ODT) 4 MG disintegrating tablet Take 1 tablet (4 mg total) by mouth 2 (two) times daily as needed for nausea or vomiting. 05/01/20   [name], Madelynn Done C, PA-C  ONE TOUCH LANCETS MISC Use to test blood sugar three times daily 04/27/18   Elayne Snare, MD  oxyCODONE-acetaminophen (PERCOCET) 5-325 MG tablet Take 1 tablet by mouth every 4 (four) hours as needed for severe pain. 11/02/20 11/02/21  Waynetta Sandy, MD  pantoprazole (PROTONIX) 40 MG tablet Take 1 tablet (40 mg total) by mouth 2 (two) times daily. 05/04/20   Domenic Polite, MD  rifaximin (XIFAXAN) 550 MG TABS tablet Take 1 tablet (550 mg total) by mouth 2 (two) times daily. 07/07/20   Shawna Clamp, MD  rosuvastatin (CRESTOR) 5 MG tablet Take 1 tablet (5 mg total) by mouth daily. 10/18/20   Elayne Snare, MD  sevelamer carbonate (RENVELA) 800 MG tablet Take 800 mg by mouth 3 (three) times daily. 09/25/20   [provider]    Allergies    Heparin and Pork-derived products  Review of Systems   Review of Systems  Unable to perform ROS: Mental status change    Physical Exam Updated Vital Signs BP (!) 133/46   Pulse 94   Temp 99.4 F (37.4 C) (Oral)   Resp 14   Ht 5' 5"  (1.651 m)   Wt 65 kg   SpO2 99%   BMI 23.85 kg/m  Physical Exam Vitals and nursing note reviewed.  Constitutional:      Appearance: He is well-developed.  HENT:     Head: Normocephalic and atraumatic.     Comments: No tenderness to palpation of skull. No deformities or crepitus noted. No open wounds, abrasions or lacerations.  Eyes:     General: No scleral icterus.       Right eye: No discharge.        Left eye: No discharge.     Conjunctiva/sclera: Conjunctivae normal.     Comments: Pupils 3 mm bilaterally.   Pulmonary:     Effort: Pulmonary effort is normal.     Comments: Lungs clear to auscultation bilaterally.  Symmetric chest rise.  No wheezing, rales, rhonchi. Musculoskeletal:     Comments: Bandage noted to the LUE. Bandage removed with fistula revision seen. Thrill noted. There is some mild swelling no overlying warmth, erythema, active drainage.   Skin:    General: Skin is warm and dry.  Neurological:     Mental Status: He is alert.     Comments: Does not follow commands Withdraws extremities to painful stimuli  Tracks my movements with William eye  Psychiatric:        Speech: Speech normal.        Behavior: Behavior normal.     ED Results / Procedures / Treatments   Labs (all labs ordered are listed, but only abnormal results are displayed) Labs Reviewed  COMPREHENSIVE METABOLIC PANEL - Abnormal; Notable for the following components:      Result Value   Chloride 97 (*)     Glucose, Bld 161 (*)    Creatinine, Ser 3.74 (*)    Calcium 7.6 (*)    Total Protein 6.2 (*)    Albumin 2.5 (*)    Alkaline Phosphatase 130 (*)    Total Bilirubin 1.9 (*)    GFR, Estimated 17 (*)    All other components within normal limits  CBC WITH DIFFERENTIAL/PLATELET - Abnormal; Notable for the following components:   RBC 3.86 (*)    Hemoglobin 10.4 (*)    HCT 32.5 (*)    Platelets 54 (*)    All other components within normal limits  ACETAMINOPHEN LEVEL - Abnormal; Notable for the following components:   Acetaminophen (Tylenol), Serum <10 (*)    All other components within normal limits  SALICYLATE LEVEL - Abnormal; Notable for the following components:   Salicylate Lvl <1.9 (*)    All other components within normal limits  CBG MONITORING, ED - Abnormal; Notable for the following components:   Glucose-Capillary 170 (*)    All other components within normal limits  I-STAT CHEM 8, ED - Abnormal; Notable for the following components:   Creatinine, Ser 3.60 (*)    Glucose, Bld 155 (*)    Calcium, Ion 0.82 (*)    Hemoglobin 10.2 (*)    HCT 30.0 (*)    All other components within normal limits  SARS CORONAVIRUS 2 (TAT 6-24 HRS)  ETHANOL  URINALYSIS, ROUTINE W REFLEX MICROSCOPIC  RAPID URINE DRUG SCREEN, HOSP PERFORMED  AMMONIA    EKG EKG Interpretation  Date/Time:  Friday November 03 2020 13:09:44 EDT Ventricular Rate:  94 PR Interval:  147 QRS Duration: 74 QT Interval:  394 QTC Calculation: 493 R Axis:   14 Text Interpretation: Sinus rhythm Probable left atrial enlargement Borderline prolonged QT interval No STEMI Confirmed by Octaviano Glow [phone]) on 11/03/2020 2:15:01 PM   Radiology DG Chest 2  View  Result Date: 11/03/2020 CLINICAL DATA:  Cough EXAM: CHEST - 2 VIEW COMPARISON:  November 02, 2020 FINDINGS: Dialysis catheter tip is at the cavoatrial junction. No pneumothorax. There is mild atelectasis in the right base. No edema or airspace opacity. Heart is upper  normal in size with pulmonary vascularity normal. No adenopathy. No bone lesions. Coils noted in left upper abdomen. IMPRESSION: Dialysis catheter tip at cavoatrial junction. No pneumothorax. Mild right base atelectasis. No edema or airspace opacity. Heart upper normal in size. Electronically Signed   By: Lowella Grip III M.D.   On: 11/03/2020 15:12   DG CHEST PORT 1 VIEW  Result Date: 11/02/2020 CLINICAL DATA:  Status post placement of a dialysis catheter. EXAM: PORTABLE CHEST 1 VIEW COMPARISON:  Single-view of the chest 07/04/2020. FINDINGS: Right IJ approach dialysis catheter tip projects at the superior cavoatrial junction. No pneumothorax. Heart size is upper normal with mild vascular congestion. Atherosclerosis noted. No consolidative process or pleural effusion. Vascular coils in the upper abdomen noted. IMPRESSION: Dialysis catheter tip projects at the superior cavoatrial junction. Negative for pneumothorax. Pulmonary vascular congestion. Aortic Atherosclerosis (ICD10-I70.0). Electronically Signed   By: Inge Rise M.D.   On: 11/02/2020 12:30   DG Fluoro Guide CV Line-No Report  Result Date: 11/02/2020 Fluoroscopy was utilized by the requesting physician.  No radiographic interpretation.    Procedures Procedures   Medications Ordered in ED Medications - No data to display  ED Course  I have reviewed the triage vital signs and the nursing notes.  Pertinent labs & imaging results that were available during my care of the patient were reviewed by me and considered in my medical decision making (see chart for details).    MDM Rules/Calculators/A&P                         70 year old male who presents for evaluation of altered mental status.  Patient cannot provide history.  Son states that he had a procedure yesterday to fix William fistula.  When he picked him up last afternoon, he was in William normal state of health.  This morning, he is noticed slightly more confused than he  normally is.  He took on dialysis and during dialysis, he became more altered, prompting EMS.  Son reports that patient is normally alert and oriented x4.  We attempted to use a Arabic interpreter which patient states that William [name] understands with no response.  Patient will withdraw from painful stimuli and track me with William eyes but otherwise does not follow commands.  I got the son on the phone who attempted to talk to William [name].  Patient did start to say something like he understood but then would not say anything else. Plan for labs, CT head.   Son is at bedside.  Son translated and attempted to ask William [name] questions.  Patient did respond with some words in Arabic.  Son stated that it was not making sense and that he was talking gibberish.  He states that this is not William normal baseline.  CMP shows BUN of 19, creatinine 3.74.  CBC shows no leukocytosis.  Hemoglobin is 10.4.  Acetaminophen, salicylate, ethanol level is unremarkable.  Patient signed out to Domenic Moras, PA-C pending labs and CT head.   Portions of this note were generated with [employer]. Dictation errors may occur despite best attempts at proofreading.  Final Clinical Impression(s) / ED Diagnoses Final diagnoses:  Altered mental status,  unspecified altered mental status type    Rx / DC Orders ED Discharge Orders    None       Desma Mcgregor 11/03/20 1531    Wyvonnia Dusky, MD 11/03/20 1753

## 2020-11-03 NOTE — ED Notes (Signed)
Per pt son at bedside wants to hold off on the Insulin and Lactulose as pt has not eaten all day. Pt given food. This RN will check back after pt has eaten.

## 2020-11-03 NOTE — ED Notes (Signed)
Pt bladder scan 121m

## 2020-11-03 NOTE — ED Notes (Signed)
Pt back from MRI, placed back on monitor.

## 2020-11-03 NOTE — H&P (Signed)
Date: 11/03/2020               Patient Name:  William [name] MRN: [id number]  DOB: August 30, 1950 Age / Sex: 70 y.o., male   PCP: Horald Pollen, MD         Medical Service: Internal Medicine Teaching Service         Attending Physician: Dr. Malvin Johns, MD    First Contact: Dr. Shon Baton Pager: [phone]  Second Contact: Dr. Darrick Meigs Pager: [phone]       After Hours (After 5p/  First Contact Pager: [phone]  weekends / holidays): Second Contact Pager: [phone]   Chief Complaint: altered mental status  History of Present Illness:  William [name] is [phone] person (he/him) with ESRD on TTS HD, T2DM, HFpEF, Goodpasture syndrome, cirrhosis 2/2 NAFLD s/p TIPS (04/2020), HTN presenting to [facility] for two days of altered mental status. History obtained via son at bedside and ED provider. William [name] was recently admitted for gangrene of left 3rd toe s/p amputation and subsequently discharged on 3/5. Since that time patient underwent cannulation of left arm AV fistula on 3/14 and found to have 2 pseudoaneurysms. Patient had subsequent procedure yesterday, 3/31 for revision and conversion to graft and insertion of R IJ tunneled HD catheter. Prior to this procedure, patient's son reports he was functioning at baseline, taking all of his medications, and attending all dialysis sessions. When the patient's son picked him up from the procedure, he was noted to be confused. Given the patient had previous bouts of hepatic encephalopathy, the son gave him lactulose. After not having a bowel movement and his confusion persisting, patient was given another dose of lactulose. Patient's son reports that he continued to be confused and not having bowel movement. He attended dialysis today after missing yesterday's session due to the procedure. Mentions afterward that he was shaking, similarly to previous episodes of hepatic encephalopathy and therefore took him to the ED.  He denies known fevers, chills, sick  contacts, change in appetite, chest pain, dyspnea, leg swelling. Last COVID-19 test two days ago negative. Of note, patient has chronic cough for the past 3 years. Patient's son also mentions he has not picked up the percocet prescribed for post-op pain.  Meds:  Current Meds  Medication Sig  . calcium acetate (PHOSLO) 667 MG capsule Take 1 capsule (667 mg total) by mouth 3 (three) times daily with meals.  . insulin degludec (TRESIBA FLEXTOUCH) 100 UNIT/ML FlexTouch Pen Inject 14 Units into the skin daily. Inject 14 units under the skin once daily. NO FURTHER REFILLS UNTIL PATIENT IS SEEN IN THE OFFICE (Patient taking differently: Inject 12 Units into the skin daily before breakfast.)  . insulin regular (NOVOLIN R,HUMULIN R) 100 units/mL injection Inject 6-8 Units into the skin See admin instructions. Inject 8 units subcutaneously with breakfast, and 6 units with lunch and supper  . lactulose (CHRONULAC) 10 GM/15ML solution Take 45 mLs (30 g total) by mouth 3 (three) times daily. (Patient taking differently: Take 30 g by mouth See admin instructions. Take 45 mls (30 g) by mouth every morning, take 45 mls (30 g) in the afternoon as needed for constipation)  . lidocaine-prilocaine (EMLA) cream Apply 1 application topically See admin instructions. Apply topically prior to dialysis on Tuesday, Thursday, Saturday  . Multiple Vitamin (MULTIVITAMIN WITH MINERALS) TABS tablet Take 1 tablet by mouth daily.  . mupirocin ointment (BACTROBAN) 2 % Apply 1 application topically daily.  . ondansetron (ZOFRAN ODT)  4 MG disintegrating tablet Take 1 tablet (4 mg total) by mouth 2 (two) times daily as needed for nausea or vomiting.  . rifaximin (XIFAXAN) 550 MG TABS tablet Take 1 tablet (550 mg total) by mouth 2 (two) times daily.  . rosuvastatin (CRESTOR) 5 MG tablet Take 1 tablet (5 mg total) by mouth daily.  . sevelamer carbonate (RENVELA) 800 MG tablet Take 800 mg by mouth 3 (three) times daily.    Allergies: Allergies as of 11/03/2020 - Review Complete 11/03/2020  Allergen Reaction Noted  . Heparin Other (See Comments) 11/11/2017  . Pork-derived products Other (See Comments) 02/18/2018   Past Medical History:  Diagnosis Date  . Anemia   . Blind one eye   . CHF (congestive heart failure) ([place])   . Cirrhosis ([place])   . ESRD on hemodialysis ([place])   . Goodpasture's disease [facility])    on outpatient plasmapheresis/notes 11/20/2017  . Grade I diastolic dysfunction 79/09/4095  . History of anemia due to chronic kidney disease   . History of blood transfusion X 1   "UGIB; low blood count"  . History of plasmapheresis    "qod" (11/20/2017)  . Hypertension   . Pancytopenia ([place]) 08/20/2017  . Type II diabetes mellitus ([facility])    Family History:  Family History  Problem Relation Age of Onset  . Diabetes Mellitus II Sister   . Diabetes Sister   . Stroke Brother   . Heart attack Brother    Social History:  Patient lives with his son. At baseline patient can converse normally and ambulate without assistance, perform ADL's independently. Denies current or history of smoking, alcohol, and recreational drugs.    Review of Systems: A complete ROS was negative except as per HPI.   Physical Exam: Blood pressure (!) 147/59, pulse 98, temperature (!) 100.8 F (38.2 C), temperature source Rectal, resp. rate 16, height _0  (1.651 m), weight 65 kg, SpO2 96 %.   General: Laying in bed in no acute distress. Non-diaphoretic. Non-toxic appearing. HENT: Normocephalic, atraumatic. Moist mucous membranes Eyes: Normal lids, no signs of trauma. Scleral icterus present bilaterally. CV: Regular rate, rhythm. 2/6 systolic murmur present, loudest at RUSB. Distal pulses 2+ bilaterally. Pulm: Normal work of breathing. Diffuse dry crackles throughout GI: Soft, non-tender, non-distended. Normoactive bowel sounds. Skin: L arm fistula without erythema, drainage. RIJ HD catheter in place without drainage,  erythema, warmth. L toe amputation site without drainage or erythema, granulation tissue present.  MSK: Normal bulk, tone. No pitting edema bilaterally.  Neuro: Awake, alert, oriented to place only. CN II, III, V in tact. Difficulty performing rest of neurological exam 2/2 mental status. Asterixis present.  CBC Latest Ref Rng & Units 11/03/2020 11/03/2020 11/02/2020  WBC 4.0 - 10.5 K/uL - 5.2 -  Hemoglobin 13.0 - 17.0 g/dL 10.2(L) 10.4(L) 11.6(L)  Hematocrit 39.0 - 52.0 % 30.0(L) 32.5(L) 34.0(L)  Platelets 150 - 400 K/uL - 54(L) -   BMP Latest Ref Rng & Units 11/03/2020 11/03/2020 11/02/2020  Glucose 70 - 99 mg/dL 155(H) 161(H) 172(H)  BUN 8 - 23 mg/dL 20 19 53(H)  Creatinine 0.61 - 1.24 mg/dL 3.60(H) 3.74(H) 7.70(H)  BUN/Creat Ratio 10 - 24 - - -  Sodium 135 - 145 mmol/L 135 135 137  Potassium 3.5 - 5.1 mmol/L 4.0 4.0 4.2  Chloride 98 - 111 mmol/L 98 97(L) 96(L)  CO2 22 - 32 mmol/L - 27 -  Calcium 8.9 - 10.3 mg/dL - 7.6(L) -   Hepatic Function Latest Ref Rng &  Units 11/03/2020 10/18/2020 10/07/2020  Total Protein 6.5 - 8.1 g/dL 6.2(L) - 5.7(L)  Albumin 3.5 - 5.0 g/dL 2.5(L) - 2.3(L)  AST 15 - 41 U/L 35 - 29  ALT 0 - 44 U/L _0 Alk Phosphatase 38 - 126 U/L 130(H) - 133(H)  Total Bilirubin 0.3 - 1.2 mg/dL 1.9(H) - 1.6(H)  Bilirubin, Direct 0.1 - 0.5 mg/dL - - -   Lactic acidosis 2.6>2.6  EKG: personally reviewed my interpretation is normal sinus rhythm, QT prolongation  CXR: personally reviewed my interpretation is RIJ catheter in place, no lobar consolidation present  Assessment & Plan by Problem: William [name] is 70yo person with ESRD on TTS HD, T2DM, HFpEF, Goodpasture syndrome, cirrhosis 2/2 NAFLD s/p TIPS with gastric varices and pancytopenia, HTN admitted 4/1 with altered mental status concerning for multiple possible etiologies, including hepatic encephalopathy vs stroke vs metabolic derangement vs underlying infection.   Active Problems:   AMS (altered mental  status)  #Altered mental status of unknown etiology #Lactic acidosis Patient presenting to ED with altered mental status for the past few days. Vital signs revealed mild fever, elevated BP, normal respiratory status. On exam, no signs of cellulitis or pneumonia. CBC, CMP overall unchanged from baseline. Initial lactic 2.6. No clear etiology for patient's presentation. Given he has had multiple episodes of hyperammonemia in the past, has not had BM in >48h, and has asterixis on exam, possibly component of hepatic encephalopathy. In addition, during procedure yesterday his blood pressure appears to have dropped and was given phenylephrine. Difficult to perform neurological exam due to his current mental status. CT head negative, will obtain MRI to rule-out CVA. Patient also has mild fever (100.8) on exam. This could possibly be due to post-operative period vs underlying infection. No signs of infection on L arm graft, RIJ, or L toe amputation site. Vascular surgery consulted in ED to assess L arm graft, not concerning for infection at incision site. BCx were drawn in ED and started on broad-spectrum antibiotics. Patient passed bedside swallow in ED, will start diet as well. - 500cc LR bolus - Trending lactic - F/u MRI brain - Lactulose 30g TID, rifaximin 568m BID (BM goal 3-4/day) - F/u BCx - C/w vancomycin, cefepime - F/u BMP, CBC, TSH, B12 in AM  #ESRD on HD TTS #Goodpasture syndrome [facility] after missing yesterday's dialysis due to his procedure. Patient is mildly hypertensive but appears euvolemic on exam. Laboratory values appear stable since last check. Patient's son reports decreased urine output since procedure yesterday. Bladder scan in ED 150cc. Plan to contact nephrology in AM for assistance with dialysis. Of [facility] - Nephrology consult in AM - C/w Renvela  - F/u BMP -  Strict I&O's  #Cirrhosis 2/2 NAFLD s/p TIPS (04/2020) Patient's son reports no BM over the last two days. On exam, abdomen is soft, non-tender, non-distended. LFT's consistent with previous laboratory values. Unclear if component of hepatic encephalopathy is contributing to altered mental status. Will give rifaximin, lactulose for BM goal 3-4/day. Of note, [facility] - Resume home lactulose, rifaximin - Daily CBC, CMP  #Type 2 diabetes mellitus  Last A1c 6.2 earlier this month. Patient's son reports compliance with insulin regimen. Glucose on arrival 170.  - Levemir 8u QHS - SSI - CBG QID  #HFpEF Patient appears euvolemic on exam. Currently not on any medications.  Will continue to monitor volume status.  #Hypertension Previously on Coreg BID, but discontinued in December 2/2 hypotension. Currently not on any antihypertensives, will continue to monitor.  Dispo: Admit patient to Inpatient with expected length of stay greater than 2 midnights.  Signed: Sanjuan Dame, MD 11/03/2020, 7:00 PM  Pager: [phone] After 5pm on weekdays and 1pm on weekends: On Call pager: [phone]

## 2020-11-03 NOTE — ED Provider Notes (Signed)
Received signout from previous provider, please see her note for complete H&P.  This is a 70 year old male with history of end-stage renal disease currently dialyzing using a left upper extremity fistula.  He had 2 areas of pseudoaneurysms that was repaired by vascular surgeon Dr. Donzetta Matters yesterday.  Son noticed that patient was a bit altered after the procedure however today when he went to his dialysis session he was weak and not answering question appropriately.  He is now not able to even answer basic questions.  When his son asked for patient to see his for them patient seems to acknowledged the question but did not say his name.  He appears to be able to move all 4 extremities on his own well but does not follow any command.  Examinations of the AV fistula without concerning signs of infection.  He does make urine therefore will obtain UA to assess for potential infection.  Plan to also obtain brain MRI to rule out stroke.  Will consult medicine for admission.  6:16 PM I did consult vascular surgeon, Dr. Carlis Abbott who did see patient in the ED for quick check of his left AV fistula.  He felt that it appears to be normal in appearance without signs of infection.  He has good palpable thrills.  Patient has a right IJ with normal appearance.  Patient is warm to the touch, I suspect his condition is likely secondary to delirium of some unknown infection source.  He has had infection in his left third toe requiring amputation in the past.  I examined his wound and it appears normal.  Will initiate broad-spectrum antibiotic.  Doubt meningitis as patient is without any nuchal rigidity.  He does have an oral temperature of 100  6:36 PM Patient has a rectal temp of 100.8.  Suspect delirium causing confusion and unlikely to be stroke.  Therefore, MRI of brain scan will be canceled.  Patient given broad-spectrum antibiotic.  Will consult medicine for admission.  6:58 PM Appreciate consultation from INternal  Medicine resident who agrees to admit pt for further work up and management of pt's delirium.    BP (!) 147/59 (BP Location: Right Arm)   Pulse 98   Temp (!) 100.8 F (38.2 C) (Rectal)   Resp 16   Ht 5' 5"  (1.651 m)   Wt 65 kg   SpO2 96%   BMI 23.85 kg/m   Results for orders placed or performed during the hospital encounter of 11/03/20  Comprehensive metabolic panel  Result Value Ref Range   Sodium 135 135 - 145 mmol/L   Potassium 4.0 3.5 - 5.1 mmol/L   Chloride 97 (L) 98 - 111 mmol/L   CO2 27 22 - 32 mmol/L   Glucose, Bld 161 (H) 70 - 99 mg/dL   BUN 19 8 - 23 mg/dL   Creatinine, Ser 3.74 (H) 0.61 - 1.24 mg/dL   Calcium 7.6 (L) 8.9 - 10.3 mg/dL   Total Protein 6.2 (L) 6.5 - 8.1 g/dL   Albumin 2.5 (L) 3.5 - 5.0 g/dL   AST 35 15 - 41 U/L   ALT 9 0 - 44 U/L   Alkaline Phosphatase 130 (H) 38 - 126 U/L   Total Bilirubin 1.9 (H) 0.3 - 1.2 mg/dL   GFR, Estimated 17 (L) >60 mL/min   Anion gap 11 5 - 15  CBC with Differential  Result Value Ref Range   WBC 5.2 4.0 - 10.5 K/uL   RBC 3.86 (L) 4.22 -  5.81 MIL/uL   Hemoglobin 10.4 (L) 13.0 - 17.0 g/dL   HCT 32.5 (L) 39.0 - 52.0 %   MCV 84.2 80.0 - 100.0 fL   MCH 26.9 26.0 - 34.0 pg   MCHC 32.0 30.0 - 36.0 g/dL   RDW 15.1 11.5 - 15.5 %   Platelets 54 (L) 150 - 400 K/uL   nRBC 0.0 0.0 - 0.2 %   Neutrophils Relative % 61 %   Neutro Abs 3.1 1.7 - 7.7 K/uL   Lymphocytes Relative 22 %   Lymphs Abs 1.1 0.7 - 4.0 K/uL   Monocytes Relative 16 %   Monocytes Absolute 0.8 0.1 - 1.0 K/uL   Eosinophils Relative 1 %   Eosinophils Absolute 0.1 0.0 - 0.5 K/uL   Basophils Relative 0 %   Basophils Absolute 0.0 0.0 - 0.1 K/uL   Immature Granulocytes 0 %   Abs Immature Granulocytes 0.01 0.00 - 0.07 K/uL  Urinalysis, Routine w reflex microscopic Urine, Catheterized  Result Value Ref Range   Color, Urine YELLOW YELLOW   APPearance HAZY (A) CLEAR   Specific Gravity, Urine 1.015 1.005 - 1.030   pH 7.0 5.0 - 8.0   Glucose, UA 50 (A) NEGATIVE  mg/dL   Hgb urine dipstick MODERATE (A) NEGATIVE   Bilirubin Urine NEGATIVE NEGATIVE   Ketones, ur NEGATIVE NEGATIVE mg/dL   Protein, ur 100 (A) NEGATIVE mg/dL   Nitrite NEGATIVE NEGATIVE   Leukocytes,Ua NEGATIVE NEGATIVE   RBC / HPF 21-50 0 - 5 RBC/hpf   WBC, UA 0-5 0 - 5 WBC/hpf   Bacteria, UA NONE SEEN NONE SEEN  Rapid urine drug screen (hospital performed)  Result Value Ref Range   Opiates NONE DETECTED NONE DETECTED   Cocaine NONE DETECTED NONE DETECTED   Benzodiazepines NONE DETECTED NONE DETECTED   Amphetamines NONE DETECTED NONE DETECTED   Tetrahydrocannabinol NONE DETECTED NONE DETECTED   Barbiturates NONE DETECTED NONE DETECTED  Acetaminophen level  Result Value Ref Range   Acetaminophen (Tylenol), Serum <10 (L) 10 - 30 ug/mL  Ethanol  Result Value Ref Range   Alcohol, Ethyl (B) <16 <10 mg/dL  Salicylate level  Result Value Ref Range   Salicylate Lvl <9.6 (L) 7.0 - 30.0 mg/dL  Ammonia  Result Value Ref Range   Ammonia 29 9 - 35 umol/L  POC CBG, ED  Result Value Ref Range   Glucose-Capillary 170 (H) 70 - 99 mg/dL  I-stat chem 8, ed  Result Value Ref Range   Sodium 135 135 - 145 mmol/L   Potassium 4.0 3.5 - 5.1 mmol/L   Chloride 98 98 - 111 mmol/L   BUN 20 8 - 23 mg/dL   Creatinine, Ser 3.60 (H) 0.61 - 1.24 mg/dL   Glucose, Bld 155 (H) 70 - 99 mg/dL   Calcium, Ion 0.82 (LL) 1.15 - 1.40 mmol/L   TCO2 27 22 - 32 mmol/L   Hemoglobin 10.2 (L) 13.0 - 17.0 g/dL   HCT 30.0 (L) 39.0 - 52.0 %   Comment NOTIFIED PHYSICIAN    DG Chest 2 View  Result Date: 11/03/2020 CLINICAL DATA:  Cough EXAM: CHEST - 2 VIEW COMPARISON:  November 02, 2020 FINDINGS: Dialysis catheter tip is at the cavoatrial junction. No pneumothorax. There is mild atelectasis in the right base. No edema or airspace opacity. Heart is upper normal in size with pulmonary vascularity normal. No adenopathy. No bone lesions. Coils noted in left upper abdomen. IMPRESSION: Dialysis catheter tip at cavoatrial  junction. No pneumothorax. Mild  right base atelectasis. No edema or airspace opacity. Heart upper normal in size. Electronically Signed   By: Lowella Grip III M.D.   On: 11/03/2020 15:12   CT Head Wo Contrast  Result Date: 11/03/2020 CLINICAL DATA:  Delirium. EXAM: CT HEAD WITHOUT CONTRAST TECHNIQUE: Contiguous axial images were obtained from the base of the skull through the vertex without intravenous contrast. COMPARISON:  CT head 07/04/2020 FINDINGS: Brain: Patchy and confluent areas of decreased attenuation are noted throughout the deep and periventricular white matter of the cerebral hemispheres bilaterally, compatible with chronic microvascular ischemic disease. No evidence of large-territorial acute infarction. No parenchymal hemorrhage. No mass lesion. Punctate hyperdensity within the right basal ganglia is noted on prior CT and likely represents mineralization (8:9). No extra-axial collection. No mass effect or midline shift. No hydrocephalus. Basilar cisterns are patent. Vascular: No hyperdense vessel. Skull: No acute fracture or focal lesion. Sinuses/Orbits: Paranasal sinuses and mastoid air cells are clear. The orbits are unremarkable. Other: None. IMPRESSION: No acute intracranial abnormality. Electronically Signed   By: Iven Finn M.D.   On: 11/03/2020 15:34   PERIPHERAL VASCULAR CATHETERIZATION  Result Date: 10/16/2020 Patient name: RYETT HAMMAN MRN: [id number] DOB: November 16, 1950 Sex: male 10/16/2020 Pre-operative Diagnosis: End-stage renal disease, malfunction left arm AV fistula Post-operative diagnosis:  Same Surgeon:  Eda Paschal. Donzetta Matters, MD Procedure Performed: 1.  Ultrasound-guided cannulation left arm AV fistula 2.  Left upper extremity fistulogram Indications: 70 year old male on dialysis via left arm AV fistula.  This has been intervened upon in the past.  He does have pulsatility in the fistula he is indicated for fistulogram with possible intervention. Findings: There is an inflow  stent which is patent.  There are 2 pseudoaneurysms between which there is approximately 60% stenosis relative to the regular diameter.  There is approximately 80% stenosis in the outflow of the vein. No intervention was undertaken given the multiple areas of disease.  We will plan for conversion to upper arm AV graft and will need catheter for approximately 1 month.  Procedure:  The patient was identified in the holding area and taken to room 8.  The patient was then placed supine on the table and prepped and draped in the usual sterile fashion.  A time out was called.  Ultrasound was used to evaluate the left arm AV fistula.  There was noted to be a stent proximally in the fistula.  The area above the stent was cannulated with 1% lidocaine cannulated micropuncture needle followed by wire and sheath.  And images saved the permanent record.  Fistulogram was performed.  With the above findings we elected no intervention.  We suture-ligated the cannulation site and removed the sheath.  Patient tolerated procedure without immediate complication.  Contrast: 30 cc Brandon C. Donzetta Matters, MD [facility] Office: [phone] Pager: [phone]   DG CHEST PORT 1 VIEW  Result Date: 11/02/2020 CLINICAL DATA:  Status post placement of a dialysis catheter. EXAM: PORTABLE CHEST 1 VIEW COMPARISON:  Single-view of the chest 07/04/2020. FINDINGS: Right IJ approach dialysis catheter tip projects at the superior cavoatrial junction. No pneumothorax. Heart size is upper normal with mild vascular congestion. Atherosclerosis noted. No consolidative process or pleural effusion. Vascular coils in the upper abdomen noted. IMPRESSION: Dialysis catheter tip projects at the superior cavoatrial junction. Negative for pneumothorax. Pulmonary vascular congestion. Aortic Atherosclerosis (ICD10-I70.0). Electronically Signed   By: Inge Rise M.D.   On: 11/02/2020 12:30   DG Foot Complete Left  Result Date:  10/07/2020 CLINICAL DATA:  Post op EXAM: LEFT FOOT - COMPLETE 3+ VIEW COMPARISON:  10/06/2020 FINDINGS: Interval amputation at the third MTP joint. There is a small amount of subcutaneous gas at the resection site. Residual bones normal alignment without fracture or other acute finding. No radiodense foreign body. No significant osseous degenerative change. IMPRESSION: Postop changes of third MTP joint amputation without apparent complication. Electronically Signed   By: Lucrezia Europe M.D.   On: 10/07/2020 10:41   DG Toe 3rd Left  Result Date: 10/06/2020 CLINICAL DATA:  Third toe pain and known diabetic ulcer EXAM: LEFT THIRD TOE COMPARISON:  04/03/2020 FINDINGS: Soft tissue ulcer is noted along the distal aspect of the third toe. No definitive bony erosive changes are noted to suggest osteomyelitis. No fracture is noted. IMPRESSION: Soft tissue ulcer in the distal third toe. No underlying bony destructive changes are seen. Electronically Signed   By: Inez Catalina M.D.   On: 10/06/2020 07:12   DG Fluoro Guide CV Line-No Report  Result Date: 11/02/2020 Fluoroscopy was utilized by the requesting physician.  No radiographic interpretation.   VAS [place] ABI WITH/WO TBI  Result Date: 10/06/2020 LOWER EXTREMITY DOPPLER STUDY Indications: Gangrene. High Risk Factors: Hypertension, Diabetes.  Limitations: Today's exam was limited due to Left HD access, skin changes. Comparison Study: No prior studies. Performing Technologist: Carlos Levering RVT  Examination Guidelines: A complete evaluation includes at minimum, Doppler waveform signals and systolic blood pressure reading at the level of bilateral brachial, anterior tibial, and posterior tibial arteries, when vessel segments are accessible. Bilateral testing is considered an integral part of a complete examination. Photoelectric Plethysmograph (PPG) waveforms and toe systolic pressure readings are included as required and additional duplex testing as needed. Limited  examinations for reoccurring indications may be performed as noted.  ABI Findings: +---------+------------------+-----+---------+--------+ Right    Rt Pressure (mmHg)IndexWaveform Comment  +---------+------------------+-----+---------+--------+ Brachial 178                    triphasic         +---------+------------------+-----+---------+--------+ PTA      225               1.26 triphasic         +---------+------------------+-----+---------+--------+ DP       232               1.30 triphasic         +---------+------------------+-----+---------+--------+ Great Toe41                0.23                   +---------+------------------+-----+---------+--------+ +---------+------------------+-----+---------+---------+ Left     Lt Pressure (mmHg)IndexWaveform Comment   +---------+------------------+-----+---------+---------+ Brachial                                 HD access +---------+------------------+-----+---------+---------+ PTA      224               1.26 triphasic          +---------+------------------+-----+---------+---------+ DP       214               1.20 biphasic           +---------+------------------+-----+---------+---------+ Great Toe122               0.69                    +---------+------------------+-----+---------+---------+ +-------+-----------+-----------+------------+------------+  ABI/TBIToday's ABIToday's TBIPrevious ABIPrevious TBI +-------+-----------+-----------+------------+------------+ Right  1.3        0.23                                +-------+-----------+-----------+------------+------------+ Left   1.26       0.69                                +-------+-----------+-----------+------------+------------+  Summary: Right: Resting right ankle-brachial index is within normal range. No evidence of significant right lower extremity arterial disease. The right toe-brachial index is abnormal. TBI likely  unreliable due to skin changes on the great toe. Left: Resting left ankle-brachial index is within normal range. No evidence of significant left lower extremity arterial disease. The left toe-brachial index is abnormal.  *See table(s) above for measurements and observations.  Electronically signed by Jamelle Haring on 10/06/2020 at 4:47:48 PM.    Final       Domenic Moras, PA-C 11/03/20 1859    Malvin Johns, MD 11/03/20 2011

## 2020-11-03 NOTE — ED Triage Notes (Signed)
Not able to assess pt effectively due to language barrier, speaks Arabic/Sudanese, Interpretor svc used [facility] ID # [id number]. Pt does not answer interpretors questions. Also tried interpretor Soha ID [id number], he did not respond to her either, PEARL at 3 mm, moving all extremeties

## 2020-11-03 NOTE — Progress Notes (Signed)
Pharmacy Antibiotic Note  William [name] is a 70 y.o. male admitted on 11/03/2020 with cellulitis/wound infection.  Pharmacy has been consulted for vancomycin and cefepime dosing.  Plan: Cefepime 2g x1, then 1g q24h Vancomycin 1546m x1, then 750 mg qT-Th-Sat w/ HD F/u HD schedule Monitor cultures, temp  Height: 5' 5"  (165.1 cm) Weight: 65 kg (143 lb 4.8 oz) IBW/kg (Calculated) : 61.5  Temp (24hrs), Avg:99.7 F (37.6 C), Min:99.4 F (37.4 C), Max:100 F (37.8 C)  Recent Labs  Lab 11/02/20 0805 11/03/20 1403 11/03/20 1445  WBC  --  5.2  --   CREATININE 7.70* 3.74* 3.60*    Estimated Creatinine Clearance: 16.8 mL/min (A) (by C-G formula based on SCr of 3.6 mg/dL (H)).    Allergies  Allergen Reactions  . Heparin Other (See Comments)    Patient is Muslim and is not permitted Pork derative due to religious belief)  . Pork-Derived Products Other (See Comments)    Patient does not eat pork due to religious beliefs    Antimicrobials this admission: Cefepime 4/1 >> Vanc 4/1 >>    Thank you for allowing pharmacy to be a part of this patient's care.  TClaudina Lick PharmD PGY1 Acute Care Pharmacy Resident 11/03/2020 6:24 PM  Please check [email or web address] for unit-specific pharmacy phone numbers.

## 2020-11-04 DIAGNOSIS — N186 End stage renal disease: Secondary | ICD-10-CM

## 2020-11-04 DIAGNOSIS — D649 Anemia, unspecified: Secondary | ICD-10-CM

## 2020-11-04 DIAGNOSIS — R6521 Severe sepsis with septic shock: Secondary | ICD-10-CM

## 2020-11-04 DIAGNOSIS — R4182 Altered mental status, unspecified: Secondary | ICD-10-CM

## 2020-11-04 DIAGNOSIS — I10 Essential (primary) hypertension: Secondary | ICD-10-CM

## 2020-11-04 DIAGNOSIS — G928 Other toxic encephalopathy: Secondary | ICD-10-CM | POA: Diagnosis not present

## 2020-11-04 LAB — CBG MONITORING, ED
Glucose-Capillary: 244 mg/dL — ABNORMAL HIGH (ref 70–99)
Glucose-Capillary: 283 mg/dL — ABNORMAL HIGH (ref 70–99)

## 2020-11-04 LAB — LACTIC ACID, PLASMA
Lactic Acid, Venous: 2.7 mmol/L (ref 0.5–1.9)
Lactic Acid, Venous: 2.7 mmol/L (ref 0.5–1.9)
Lactic Acid, Venous: 4.2 mmol/L (ref 0.5–1.9)
Lactic Acid, Venous: 1.9 mmol/L (ref 0.5–1.9)

## 2020-11-04 LAB — CBC
HCT: 26 % — ABNORMAL LOW (ref 39.0–52.0)
HCT: 28.4 % — ABNORMAL LOW (ref 39.0–52.0)
Hemoglobin: 8.3 g/dL — ABNORMAL LOW (ref 13.0–17.0)
Hemoglobin: 9.2 g/dL — ABNORMAL LOW (ref 13.0–17.0)
MCH: 27.7 pg (ref 26.0–34.0)
MCH: 27.1 pg (ref 26.0–34.0)
MCHC: 31.9 g/dL (ref 30.0–36.0)
MCHC: 32.4 g/dL (ref 30.0–36.0)
MCV: 86.7 fL (ref 80.0–100.0)
MCV: 83.5 fL (ref 80.0–100.0)
Platelets: 52 10*3/uL — ABNORMAL LOW (ref 150–400)
Platelets: 57 10*3/uL — ABNORMAL LOW (ref 150–400)
RBC: 3 MIL/uL — ABNORMAL LOW (ref 4.22–5.81)
RBC: 3.4 MIL/uL — ABNORMAL LOW (ref 4.22–5.81)
RDW: 15.1 % (ref 11.5–15.5)
RDW: 14.9 % (ref 11.5–15.5)
WBC: 3.5 10*3/uL — ABNORMAL LOW (ref 4.0–10.5)
WBC: 5.2 10*3/uL (ref 4.0–10.5)
nRBC: 0 % (ref 0.0–0.2)
nRBC: 0 % (ref 0.0–0.2)

## 2020-11-04 LAB — VITAMIN B12: Vitamin B-12: 2405 pg/mL — ABNORMAL HIGH (ref 180–914)

## 2020-11-04 LAB — GLUCOSE, CAPILLARY
Glucose-Capillary: 135 mg/dL — ABNORMAL HIGH (ref 70–99)
Glucose-Capillary: 142 mg/dL — ABNORMAL HIGH (ref 70–99)

## 2020-11-04 LAB — TSH: TSH: 2.044 u[IU]/mL (ref 0.350–4.500)

## 2020-11-04 LAB — BASIC METABOLIC PANEL
Anion gap: 13 (ref 5–15)
BUN: 36 mg/dL — ABNORMAL HIGH (ref 8–23)
CO2: 23 mmol/L (ref 22–32)
Calcium: 7.3 mg/dL — ABNORMAL LOW (ref 8.9–10.3)
Chloride: 95 mmol/L — ABNORMAL LOW (ref 98–111)
Creatinine, Ser: 5.4 mg/dL — ABNORMAL HIGH (ref 0.61–1.24)
GFR, Estimated: 11 mL/min — ABNORMAL LOW (ref >60)
Glucose, Bld: 304 mg/dL — ABNORMAL HIGH (ref 70–99)
Potassium: 4.4 mmol/L (ref 3.5–5.1)
Sodium: 131 mmol/L — ABNORMAL LOW (ref 135–145)

## 2020-11-04 MED ORDER — ALTEPLASE 2 MG IJ SOLR: 2.0000 mg | Freq: Once | INTRAMUSCULAR | Status: DC | PRN

## 2020-11-04 MED ORDER — VANCOMYCIN VARIABLE DOSE PER UNSTABLE RENAL FUNCTION (PHARMACIST DOSING): Status: DC

## 2020-11-04 MED ORDER — CHLORHEXIDINE GLUCONATE CLOTH 2 % EX PADS
6.0000 | MEDICATED_PAD | Freq: Every day | CUTANEOUS | Status: DC
  Administered 2020-11-05: 6 via TOPICAL

## 2020-11-04 MED ORDER — PENTAFLUOROPROP-TETRAFLUOROETH EX AERO: 1.0000 "application " | INHALATION_SPRAY | CUTANEOUS | Status: DC | PRN

## 2020-11-04 MED ORDER — SODIUM CHLORIDE 0.9 % IV SOLN: 100.0000 mL | INTRAVENOUS | Status: DC | PRN

## 2020-11-04 MED ORDER — LIDOCAINE HCL (PF) 1 % IJ SOLN: 5.0000 mL | INTRAMUSCULAR | Status: DC | PRN

## 2020-11-04 MED ORDER — INSULIN DETEMIR 100 UNIT/ML ~~LOC~~ SOLN
12.0000 [IU] | Freq: Every day | SUBCUTANEOUS | Status: DC
  Administered 2020-11-04: 12 [IU] via SUBCUTANEOUS
  Filled 2020-11-04 (×3): qty 0.12

## 2020-11-04 MED ORDER — LIDOCAINE-PRILOCAINE 2.5-2.5 % EX CREA: 1.0000 "application " | TOPICAL_CREAM | CUTANEOUS | Status: DC | PRN

## 2020-11-04 MED ORDER — LACTATED RINGERS IV BOLUS
500.0000 mL | Freq: Once | INTRAVENOUS | Status: AC
  Administered 2020-11-04: 500 mL via INTRAVENOUS

## 2020-11-04 MED ORDER — DOXERCALCIFEROL 4 MCG/2ML IV SOLN
1.0000 ug | INTRAVENOUS | Status: DC
  Filled 2020-11-04: qty 2

## 2020-11-04 MED ORDER — LACTATED RINGERS IV BOLUS: 1000.0000 mL | Freq: Once | INTRAVENOUS | Status: DC

## 2020-11-04 MED ORDER — ANTICOAGULANT SODIUM CITRATE 4% (200MG/5ML) IV SOLN
5.0000 mL | Status: DC
  Filled 2020-11-04: qty 5

## 2020-11-04 MED ORDER — HEPARIN SODIUM (PORCINE) 1000 UNIT/ML IJ SOLN
INTRAMUSCULAR | Status: AC
  Administered 2020-11-04: 1000 [IU]
  Filled 2020-11-04: qty 4

## 2020-11-04 MED ORDER — VANCOMYCIN HCL IN DEXTROSE 500-5 MG/100ML-% IV SOLN
500.0000 mg | INTRAVENOUS | Status: AC
  Administered 2020-11-05: 500 mg via INTRAVENOUS
  Filled 2020-11-04: qty 100

## 2020-11-04 NOTE — Progress Notes (Signed)
   Subjective: son present at bedside this morning who served to interpret. Pt notes feeling better this morning. Son feels that he mental status has returned to baseline. No particular complaints.  Objective:  Vital signs in last 24 hours: Vitals:   11/04/20 0659 11/04/20 0915 11/04/20 1200 11/04/20 1421  BP:  119/60 (!) 123/52 (!) 157/54  Pulse:  82 92 93  Resp:  16 16 15   Temp:  99.4 F (37.4 C)  98.9 F (37.2 C)  TempSrc:  Axillary  Oral  SpO2: 96% 99% 97% 98%  Weight:      Height:       General: chronically ill but non-toxic appearing HEENT: no oropharyngeal erythema or exudates Cardiac: RRR. 4/6 systolic murmur appreciated greatest over the RUSB. No LE edema Pulm: breathing comfortably on room air. Lungs clear anteriorly GI: no distension. Not tender Skin: clean and intact dressing over the left third toe without surrounding erythema, edema or drainage  Assessment/Plan:  Active Problems:   ESRD on dialysis ([facility])   AMS (altered mental status)  70 yo male with ESRD on HD THS, liver cirrhosis s/p TIPS, and heart failure admitted for altered mental status evaluation following undergoing a revision of his fistula. So far, infectious workup has been unremarkable and his mental status has returned to baseline.  Acute toxic and metabolic encephalopathy (resolved). Likely in the setting of sedation for the procedure the day of admission. Although ammonia level was not elevated on admission, suspect this may have also played a role Low suspicion for infection although renal and liver disease may be masking typical findings. LA was initially elevated but has now returned to normal values. No suspicious findings on physical exam. No evidence of sepsis at this time Plan -continue antibiotics today. If he does not develop s/s of infection on re-evaluation tomorrow, can consider discontinuing them -follow cultures  ESRD on HD THS. Appreciate nephrology's assistance.  Cirrhosis 2/2 to  NAFLD s/p TIPS. Continue lactulose and rifaxamin Insulin dependent T2DM: CBGs above goal in the office today. Will increase levemir 8>12U, continue novolog.  HFpEF. Appearing euvolemic. Continuing to monitor.   Code: FULL Dispo: likely will be stable for discharge tomorrow if he remains stable overnight.  Mitzi Hansen, MD Internal Medicine Resident PGY-2 [facility] Pager: [phone] 11/04/2020 3:25 PM    After 5pm on weekdays and 1pm on weekends: On Call pager [phone]

## 2020-11-04 NOTE — Consult Note (Signed)
[facility]  INPATIENT CONSULTATION  Reason for Consultation: management of ESRD and assoc conditions Requesting Provider: Dr. Heber   HPI: William [name] is an 70 y.o. 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   Had HD outpt off schedule yesterday due to AV access surgery Thursday - had AVF anerusysm bypassed with AVG and RIJ TDC placed.  At end of tx was noted to be altered which did not improve with lactulose so family brought to ED.  In the ED T 100.8, WBC 5.2, normo to hypertensive, lactate 2.6 initially.  Treated with LR 0.5L bolus, broad spectrum antibiotics, lactulose.  MRI brain without acute issue.  He's improved today to baseline per son.  Remains admitted while cultures pending, currently NGTD. Chronic cough unchanged. No other issues.  Had L 3rd toe amputated 10/07/20.  COVID neg 11/03/20 ED.   Son was interpreter for interview.   PMH: Past Medical History:  Diagnosis Date  . Anemia   . Blind one eye   . CHF (congestive heart failure) ([place])   . Cirrhosis ([place])   . ESRD on hemodialysis ([place])   . Goodpasture's disease [facility])    on outpatient plasmapheresis/notes 11/20/2017  . Grade I diastolic dysfunction 32/95/1884  . History of anemia due to chronic kidney disease   . History of blood transfusion X 1   "UGIB; low blood count"  . History of plasmapheresis    "qod" (11/20/2017)  . Hypertension   . Pancytopenia ([place]) 08/20/2017  . Type II diabetes mellitus (HCC)    PSH: Past Surgical History:  Procedure Laterality Date  . A/V FISTULAGRAM Left 10/16/2020   Procedure: A/V FISTULAGRAM;  Surgeon: Waynetta Sandy, MD;  Location: [facility];  Service: Cardiovascular;  Laterality: Left;  . AMPUTATION TOE Left 10/07/2020   Procedure: AMPUTATION THIRD TOE;  Surgeon: Criselda Peaches, DPM;  Location: [place];   Service: Podiatry;  Laterality: Left;  . AV FISTULA PLACEMENT Left 12/26/2017   Procedure: LEFT BRACHIOCEPHALIC ARTERIOVENOUS (AV) FISTULA CREATION;  Surgeon: Conrad Edison, MD;  Location: [place];  Service: Vascular;  Laterality: Left;  . AV FISTULA PLACEMENT Left 11/02/2020   Procedure: INSERTION OF ARTERIOVENOUS (AV) GORE-TEX GRAFT ARM;  Surgeon: Waynetta Sandy, MD;  Location: [place];  Service: Vascular;  Laterality: Left;  . BASCILIC VEIN TRANSPOSITION Left 02/16/2018   Procedure: BRACHIOBASILIC VEIN TRANSPOSITION SECOND STAGE;  Surgeon: Conrad Corral [place]iejo, MD;  Location: [place];  Service: Vascular;  Laterality: Left;  . CATARACT EXTRACTION W/ INTRAOCULAR LENS  IMPLANT, BILATERAL Bilateral   . ESOPHAGEAL BANDING N/A 04/01/2018   Procedure: ESOPHAGEAL BANDING;  Surgeon: Otis Brace, MD;  [facility];  Service: Gastroenterology;  Laterality: N/A;  . ESOPHAGEAL BANDING N/A 06/29/2018   Procedure: ESOPHAGEAL BANDING;  Surgeon: Otis Brace, MD;  [facility];  Service: Gastroenterology;  Laterality: N/A;  . ESOPHAGOGASTRODUODENOSCOPY (EGD) WITH PROPOFOL N/A 04/01/2018   Procedure: ESOPHAGOGASTRODUODENOSCOPY (EGD) WITH PROPOFOL;  Surgeon: Otis Brace, MD;  [facility];  Service: Gastroenterology;  Laterality: N/A;  . ESOPHAGOGASTRODUODENOSCOPY (EGD) WITH PROPOFOL N/A 06/29/2018   Procedure: ESOPHAGOGASTRODUODENOSCOPY (EGD) WITH PROPOFOL;  Surgeon: Otis Brace, MD;  [facility];  Service: Gastroenterology;  Laterality: N/A;  . ESOPHAGOGASTRODUODENOSCOPY (EGD) WITH PROPOFOL N/A 09/30/2018   Procedure: ESOPHAGOGASTRODUODENOSCOPY (EGD) WITH PROPOFOL;  Surgeon: Otis Brace, MD;  [facility];  Service: Gastroenterology;  Laterality: N/A;  . ESOPHAGOGASTRODUODENOSCOPY (EGD) WITH PROPOFOL N/A 04/20/2019   Procedure: ESOPHAGOGASTRODUODENOSCOPY (EGD) WITH PROPOFOL;  Surgeon: Otis Brace, MD;  [facility];   Service: Gastroenterology;  Laterality: N/A;  . ESOPHAGOGASTRODUODENOSCOPY (EGD) WITH PROPOFOL N/A 04/17/2020   Procedure: ESOPHAGOGASTRODUODENOSCOPY (EGD) WITH PROPOFOL;  Surgeon: Wonda Horner, MD;  Location: [facility];  Service: Gastroenterology;  Laterality: N/A;  . I & D EXTREMITY Left 02/18/2018   Procedure: IRRIGATION AND DEBRIDEMENT LEFT ARM;  Surgeon: Serafina Mitchell, MD;  Location[facility];  Service: Vascular;  Laterality: Left;  . INSERTION OF DIALYSIS CATHETER N/A 01/19/2018   Procedure: INSERTION OF DIALYSIS CATHETER - RIGHT INTERNAL JUGULAR PLACEMENT;  Surgeon: Rosetta Posner, MD;  Location: [facility];  Service: Vascular;  Laterality: N/A;  . INSERTION OF DIALYSIS CATHETER Right 11/02/2020   Procedure: INSERTION OF TUNNELED DIALYSIS CATHETER;  Surgeon: Waynetta Sandy, MD;  Location: [facility];  Service: Vascular;  Laterality: Right;  . IR FLUORO GUIDE CV LINE RIGHT  11/10/2017  . IR PARACENTESIS  12/10/2017  . IR PARACENTESIS  12/26/2017  . IR REMOVAL TUN CV CATH W/O FL  11/28/2017  . IR THROMBECTOMY AV FISTULA W/THROMBOLYSIS/PTA INC/SHUNT/IMG LEFT Left 06/02/2019  . IR US GUIDE VASC ACCESS LEFT  06/02/2019  . IR US GUIDE VASC ACCESS RIGHT  11/10/2017  . NECK SURGERY  2007   "back of neck; no hardware in there"  . REVISON OF ARTERIOVENOUS FISTULA Left 11/02/2020   Procedure: REVISON OF ARTERIOVENOUS FISTULA;  Surgeon: Waynetta Sandy, MD;  Location: [place];  Service: Vascular;  Laterality: Left;    Past Medical History:  Diagnosis Date  . Anemia   . Blind one eye   . CHF (congestive heart failure) ([place])   . Cirrhosis ([place])   . ESRD on hemodialysis ([place])   . Goodpasture's disease [facility])    on outpatient plasmapheresis/notes 11/20/2017  . Grade I diastolic dysfunction 60/45/4098  . History of anemia due to chronic kidney disease   . History of blood transfusion X 1   "UGIB; low blood count"  . History of plasmapheresis    "qod" (11/20/2017)  . Hypertension   .  Pancytopenia ([place]) 08/20/2017  . Type II diabetes mellitus (HCC)     Medications:  I have reviewed the patient's current medications.  (Not in a hospital admission)   ALLERGIES:   Allergies  Allergen Reactions  . Heparin Other (See Comments)    Patient is Muslim and is not permitted Pork derative due to religious belief)  . Pork-Derived Products Other (See Comments)    Patient does not eat pork due to religious beliefs    FAM HX: Family History  Problem Relation Age of Onset  . Diabetes Mellitus II Sister   . Diabetes Sister   . Stroke Brother   . Heart attack Brother     Social History:   reports that he has never smoked. He has never used smokeless tobacco. He reports that he does not drink alcohol and does not use drugs.  ROS: 12system ROS neg except per HPI  Blood pressure (!) 157/54, pulse 93, temperature 98.9 F (37.2 C), temperature source Oral, resp. rate 15, height 5' 5"  (1.651 m), weight 65 kg, SpO2 98 %. PHYSICAL EXAM: Gen: chroincally ill, nontoxic  Eyes:  EOMI, glasses ENT: MMM Neck: supple CV:  RRR Abd:  Soft, nontender, +BS Lungs: clear, occ dry cough GU: no foley Extr:  No edema, L  3rd toe s/p amputation, dressing with scant blood on dressing; LUE incision at [facility] fossa c/d/i; AVG with +t/b some post op edema without erythema Neuro: seems grossly nonfocal, conversating through son RIJ [facility] c/d/i dressing.     Results for orders placed or performed during the hospital encounter of 11/03/20 (from the past 48 hour(s))  POC CBG, ED     Status: Abnormal   Collection Time: 11/03/20  1:58 PM  Result Value Ref Range   Glucose-Capillary 170 (H) 70 - 99 mg/dL    Comment: Glucose reference range applies only to samples taken after fasting for at least 8 hours.  Comprehensive metabolic panel     Status: Abnormal   Collection Time: 11/03/20  2:03 PM  Result Value Ref Range   Sodium 135 135 - 145 mmol/L   Potassium 4.0 3.5 - 5.1 mmol/L   Chloride 97 (L) 98  - 111 mmol/L   CO2 27 22 - 32 mmol/L   Glucose, Bld 161 (H) 70 - 99 mg/dL    Comment: Glucose reference range applies only to samples taken after fasting for at least 8 hours.   BUN 19 8 - 23 mg/dL   Creatinine, Ser 3.74 (H) 0.61 - 1.24 mg/dL    Comment: DELTA CHECK NOTED   Calcium 7.6 (L) 8.9 - 10.3 mg/dL   Total Protein 6.2 (L) 6.5 - 8.1 g/dL   Albumin 2.5 (L) 3.5 - 5.0 g/dL   AST 35 15 - 41 U/L   ALT 9 0 - 44 U/L   Alkaline Phosphatase 130 (H) 38 - 126 U/L   Total Bilirubin 1.9 (H) 0.3 - 1.2 mg/dL   GFR, Estimated 17 (L) >60 mL/min    Comment: (NOTE) Calculated using the CKD-EPI Creatinine Equation (2021)    Anion gap 11 5 - 15    Comment: Performed at [facility] [address] [place], [place] [address]  CBC with Differential     Status: Abnormal   Collection Time: 11/03/20  2:03 PM  Result Value Ref Range   WBC 5.2 4.0 - 10.5 K/uL   RBC 3.86 (L) 4.22 - 5.81 MIL/uL   Hemoglobin 10.4 (L) 13.0 - 17.0 g/dL   HCT 32.5 (L) 39.0 - 52.0 %   MCV 84.2 80.0 - 100.0 fL   MCH 26.9 26.0 - 34.0 pg   MCHC 32.0 30.0 - 36.0 g/dL   RDW 15.1 11.5 - 15.5 %   Platelets 54 (L) 150 - 400 K/uL    Comment: Immature Platelet Fraction may be clinically indicated, consider ordering this additional test [id number] CONSISTENT WITH PREVIOUS RESULT REPEATED TO VERIFY PLATELET COUNT CONFIRMED BY SMEAR    nRBC 0.0 0.0 - 0.2 %   Neutrophils Relative % 61 %   Neutro Abs 3.1 1.7 - 7.7 K/uL   Lymphocytes Relative 22 %   Lymphs Abs 1.1 0.7 - 4.0 K/uL   Monocytes Relative 16 %   Monocytes Absolute 0.8 0.1 - 1.0 K/uL   Eosinophils Relative 1 %   Eosinophils Absolute 0.1 0.0 - 0.5 K/uL   Basophils Relative 0 %   Basophils Absolute 0.0 0.0 - 0.1 K/uL   Immature Granulocytes 0 %   Abs Immature Granulocytes 0.01 0.00 - 0.07 K/uL    Comment: Performed at [facility], [place] [address] [place], [place] [address]  Acetaminophen level     Status: Abnormal   Collection Time: 11/03/20  2:03 PM   Result Value Ref Range  Acetaminophen (Tylenol), Serum <10 (L) 10 - 30 ug/mL    Comment: (NOTE) Therapeutic concentrations vary significantly. A range of 10-30 ug/mL  may be an effective concentration for many patients. However, some  are best treated at concentrations outside of this range. Acetaminophen concentrations >150 ug/mL at 4 hours after ingestion  and >50 ug/mL at 12 hours after ingestion are often associated with  toxic reactions.  Performed at [facility], [place] [address] [place], [place] [address]   Ethanol     Status: None   Collection Time: 11/03/20  2:03 PM  Result Value Ref Range   Alcohol, Ethyl (B) <10 <10 mg/dL    Comment: (NOTE) Lowest detectable limit for serum alcohol is 10 mg/dL.  For medical purposes only. Performed at [facility], [place] [address], [place], [place] [address]   Salicylate level     Status: Abnormal   Collection Time: 11/03/20  2:03 PM  Result Value Ref Range   Salicylate Lvl <2.7 (L) 7.0 - 30.0 mg/dL    Comment: Performed at [facility] [address], [place], [place] [address]  I-stat chem 8, ed     Status: Abnormal   Collection Time: 11/03/20  2:45 PM  Result Value Ref Range   Sodium 135 135 - 145 mmol/L   Potassium 4.0 3.5 - 5.1 mmol/L   Chloride 98 98 - 111 mmol/L   BUN 20 8 - 23 mg/dL   Creatinine, Ser 3.60 (H) 0.61 - 1.24 mg/dL   Glucose, Bld 155 (H) 70 - 99 mg/dL    Comment: Glucose reference range applies only to samples taken after fasting for at least 8 hours.   Calcium, Ion 0.82 (LL) 1.15 - 1.40 mmol/L   TCO2 27 22 - 32 mmol/L   Hemoglobin 10.2 (L) 13.0 - 17.0 g/dL   HCT 30.0 (L) 39.0 - 52.0 %   Comment NOTIFIED PHYSICIAN   SARS CORONAVIRUS 2 (TAT 6-24 HRS) Nasopharyngeal Nasopharyngeal Swab     Status: None   Collection Time: 11/03/20  3:29 PM   Specimen: Nasopharyngeal Swab  Result Value Ref Range   SARS Coronavirus 2 NEGATIVE NEGATIVE    Comment: (NOTE) SARS-CoV-2 target nucleic acids  are NOT DETECTED.  The SARS-CoV-2 RNA is generally detectable in upper and lower respiratory specimens during the acute phase of infection. Negative results do not preclude SARS-CoV-2 infection, do not rule out co-infections with other pathogens, and should not be used as the sole basis for treatment or other patient management decisions. Negative results must be combined with clinical observations, patient history, and epidemiological information. The expected result is Negative.  Fact Sheet for Patients: SugarRoll.be  Fact Sheet for Healthcare Providers: https://www.woods-mathews.com/  This test is not yet approved or cleared by the [place] FDA and  has been authorized for detection and/or diagnosis of SARS-CoV-2 by FDA under an Emergency Use Authorization (EUA). This EUA will remain  in effect (meaning this test can be used) for the duration of the COVID-19 declaration under Se ction 564(b)(1) of the Act, 21 U.S.C. section 360bbb-3(b)(1), unless the authorization is terminated or revoked sooner.  Performed at [facility], [place] [address], [place], [place] [address]   Ammonia     Status: None   Collection Time: 11/03/20  3:35 PM  Result Value Ref Range   Ammonia 29 9 - 35 umol/L    Comment: Performed at [facility], [place] [address], [place], [place] [address]  Urinalysis, Routine w reflex microscopic Urine, Catheterized     Status: Abnormal   Collection Time: 11/03/20  5:05 PM  Result Value Ref Range   Color, Urine YELLOW YELLOW   APPearance HAZY (A) CLEAR   Specific Gravity, Urine 1.015 1.005 - 1.030   pH 7.0 5.0 - 8.0   Glucose, UA 50 (A) NEGATIVE mg/dL   Hgb urine dipstick MODERATE (A) NEGATIVE   Bilirubin Urine NEGATIVE NEGATIVE   Ketones, ur NEGATIVE NEGATIVE mg/dL   Protein, ur 100 (A) NEGATIVE mg/dL   Nitrite NEGATIVE NEGATIVE   Leukocytes,Ua NEGATIVE NEGATIVE   RBC / HPF 21-50 0 - 5 RBC/hpf   WBC, UA  0-5 0 - 5 WBC/hpf   Bacteria, UA NONE SEEN NONE SEEN    Comment: Performed at [facility] [address], [place], [place] [address]  Rapid urine drug screen (hospital performed)     Status: None   Collection Time: 11/03/20  5:05 PM  Result Value Ref Range   Opiates NONE DETECTED NONE DETECTED   Cocaine NONE DETECTED NONE DETECTED   Benzodiazepines NONE DETECTED NONE DETECTED   Amphetamines NONE DETECTED NONE DETECTED   Tetrahydrocannabinol NONE DETECTED NONE DETECTED   Barbiturates NONE DETECTED NONE DETECTED    Comment: (NOTE) DRUG SCREEN FOR MEDICAL PURPOSES ONLY.  IF CONFIRMATION IS NEEDED FOR ANY PURPOSE, NOTIFY LAB WITHIN 5 DAYS.  LOWEST DETECTABLE LIMITS FOR URINE DRUG SCREEN Drug Class                     Cutoff (ng/mL) Amphetamine and metabolites    1000 Barbiturate and metabolites    200 Benzodiazepine                 188 Tricyclics and metabolites     300 Opiates and metabolites        300 Cocaine and metabolites        300 THC                            50 Performed at [facility], [place] [address] [place], [place] [address]   Lactic acid, plasma     Status: Abnormal   Collection Time: 11/03/20  6:30 PM  Result Value Ref Range   Lactic Acid, Venous 2.6 (HH) 0.5 - 1.9 mmol/L    Comment: CRITICAL RESULT CALLED TO, READ BACK BY AND VERIFIED WITH: E.SULLIVAN RN 1917 11/03/20 MCCORMICK K Performed at [facility] [address], [place], [place] [address]   Lactic acid, plasma     Status: Abnormal   Collection Time: 11/03/20  7:55 PM  Result Value Ref Range   Lactic Acid, Venous 2.6 (HH) 0.5 - 1.9 mmol/L    Comment: CRITICAL VALUE NOTED.  VALUE IS CONSISTENT WITH PREVIOUSLY REPORTED AND CALLED VALUE. Performed at [facility], [place] [address], [place], [place] [address]   Blood culture (routine x 2)     Status: None (Preliminary result)   Collection Time: 11/03/20  7:57 PM   Specimen: BLOOD  Result Value Ref Range   Specimen  Description BLOOD RIGHT ANTECUBITAL    Special Requests      BOTTLES DRAWN AEROBIC AND ANAEROBIC Blood Culture results may not be optimal due to an inadequate volume of blood received in culture bottles   Culture      NO GROWTH < 12 HOURS Performed at [facility], [address] [place], [place] [address]    Report Status PENDING   CBG monitoring, ED     Status: Abnormal   Collection Time: 11/03/20 10:27 PM  Result Value Ref Range   Glucose-Capillary 172 (H) 70 - 99 mg/dL    Comment: Glucose reference range applies only to samples taken after fasting for at least 8 hours.  Blood culture (routine x 2)     Status: None (Preliminary result)   Collection Time: 11/03/20 11:51 PM   Specimen: BLOOD RIGHT WRIST  Result Value Ref Range   Specimen Description BLOOD RIGHT WRIST    Special Requests      BOTTLES DRAWN AEROBIC AND ANAEROBIC Blood Culture results may not be optimal due to an inadequate volume of blood received in culture bottles   Culture      NO GROWTH < 12 HOURS Performed at [facility] [address], [place], [place] [address]    Report Status PENDING   Lactic acid, plasma     Status: Abnormal   Collection Time: 11/04/20 12:00 AM  Result Value Ref Range   Lactic Acid, Venous 2.7 (HH) 0.5 - 1.9 mmol/L    Comment: CRITICAL VALUE NOTED.  VALUE IS CONSISTENT WITH PREVIOUSLY REPORTED AND CALLED VALUE. Performed at [facility], [place] [address], [place], [place] [address]   Basic metabolic panel     Status: Abnormal   Collection Time: 11/04/20  4:09 AM  Result Value Ref Range   Sodium 131 (L) 135 - 145 mmol/L   Potassium 4.4 3.5 - 5.1 mmol/L   Chloride 95 (L) 98 - 111 mmol/L   CO2 23 22 - 32 mmol/L   Glucose, Bld 304 (H) 70 - 99 mg/dL    Comment: Glucose reference range applies only to samples taken after fasting for at least 8 hours.   BUN 36 (H) 8 - 23 mg/dL   Creatinine, Ser 5.40 (H) 0.61 - 1.24 mg/dL   Calcium 7.3 (L) 8.9 - 10.3 mg/dL   GFR,  Estimated 11 (L) >60 mL/min    Comment: (NOTE) Calculated using the CKD-EPI Creatinine Equation (2021)    Anion gap 13 5 - 15    Comment: Performed at [facility] [address], [place], [place] [address]  CBC     Status: Abnormal   Collection Time: 11/04/20  4:09 AM  Result Value Ref Range   WBC 3.5 (L) 4.0 - 10.5 K/uL   RBC 3.00 (L) 4.22 - 5.81 MIL/uL   Hemoglobin 8.3 (L) 13.0 - 17.0 g/dL   HCT 26.0 (L) 39.0 - 52.0 %   MCV 86.7 80.0 - 100.0 fL   MCH 27.7 26.0 - 34.0 pg   MCHC 31.9 30.0 - 36.0 g/dL   RDW 15.1 11.5 - 15.5 %   Platelets 52 (L) 150 - 400 K/uL    Comment: Immature Platelet Fraction may be clinically indicated, consider ordering this additional test [id number] CONSISTENT WITH PREVIOUS RESULT REPEATED TO VERIFY    nRBC 0.0 0.0 - 0.2 %    Comment: Performed at [facility], [place] [address], [place], [place] [address]  Lactic acid, plasma     Status: Abnormal   Collection Time: 11/04/20  4:11 AM  Result Value Ref Range   Lactic Acid, Venous 2.7 (HH) 0.5 - 1.9 mmol/L    Comment: CRITICAL VALUE NOTED.  VALUE IS CONSISTENT WITH PREVIOUSLY REPORTED AND CALLED VALUE. Performed at [facility], [place]  [address] [place], [place] [address]   Lactic acid, plasma     Status: Abnormal   Collection Time: 11/04/20  6:24 AM  Result Value Ref Range   Lactic Acid, Venous 4.2 (HH) 0.5 - 1.9 mmol/L    Comment: CRITICAL VALUE NOTED.  VALUE IS CONSISTENT WITH PREVIOUSLY REPORTED AND CALLED VALUE. Performed at [facility], [place] [address], [place], [place] [address]   CBG monitoring, ED     Status: Abnormal   Collection Time: 11/04/20  8:22 AM  Result Value Ref Range   Glucose-Capillary 244 (H) 70 - 99 mg/dL    Comment: Glucose reference range applies only to samples taken after fasting for at least 8 hours.  TSH     Status: None   Collection Time: 11/04/20 10:32 AM  Result Value Ref Range   TSH 2.044 0.350 - 4.500 uIU/mL    Comment: Performed by a 3rd  Generation assay with a functional sensitivity of <=0.01 uIU/mL. Performed at [facility], [place] [address], [place], [place] [address]   Vitamin B12     Status: Abnormal   Collection Time: 11/04/20 10:32 AM  Result Value Ref Range   Vitamin B-12 2,405 (H) 180 - 914 pg/mL    Comment: (NOTE) This assay is not validated for testing neonatal or myeloproliferative syndrome specimens for Vitamin B12 levels. Performed at [facility], [place] [address] [place], [place] [address]   Lactic acid, plasma     Status: None   Collection Time: 11/04/20 10:32 AM  Result Value Ref Range   Lactic Acid, Venous 1.9 0.5 - 1.9 mmol/L    Comment: Performed at [facility] [address] [place], [place] [address]  CBG monitoring, ED     Status: Abnormal   Collection Time: 11/04/20 12:39 PM  Result Value Ref Range   Glucose-Capillary 283 (H) 70 - 99 mg/dL    Comment: Glucose reference range applies only to samples taken after fasting for at least 8 hours.    DG Chest 2 View  Result Date: 11/03/2020 CLINICAL DATA:  Cough EXAM: CHEST - 2 VIEW COMPARISON:  November 02, 2020 FINDINGS: Dialysis catheter tip is at the cavoatrial junction. No pneumothorax. There is mild atelectasis in the right base. No edema or airspace opacity. Heart is upper normal in size with pulmonary vascularity normal. No adenopathy. No bone lesions. Coils noted in left upper abdomen. IMPRESSION: Dialysis catheter tip at cavoatrial junction. No pneumothorax. Mild right base atelectasis. No edema or airspace opacity. Heart upper normal in size. Electronically Signed   By: Lowella Grip III M.D.   On: 11/03/2020 15:12   CT Head Wo Contrast  Result Date: 11/03/2020 CLINICAL DATA:  Delirium. EXAM: CT HEAD WITHOUT CONTRAST TECHNIQUE: Contiguous axial images were obtained from the base of the skull through the vertex without intravenous contrast. COMPARISON:  CT head 07/04/2020 FINDINGS: Brain: Patchy and confluent areas of  decreased attenuation are noted throughout the deep and periventricular white matter of the cerebral hemispheres bilaterally, compatible with chronic microvascular ischemic disease. No evidence of large-territorial acute infarction. No parenchymal hemorrhage. No mass lesion. Punctate hyperdensity within the right basal ganglia is noted on prior CT and likely represents mineralization (8:9). No extra-axial collection. No mass effect or midline shift. No hydrocephalus. Basilar cisterns are patent. Vascular: No hyperdense vessel. Skull: No acute fracture or focal lesion. Sinuses/Orbits: Paranasal sinuses and mastoid air cells are clear. The orbits are unremarkable. Other: None. IMPRESSION: No acute  intracranial abnormality. Electronically Signed   By: Iven Finn M.D.   On: 11/03/2020 15:34   MR BRAIN WO CONTRAST  Result Date: 11/03/2020 CLINICAL DATA:  Encephalopathy EXAM: MRI HEAD WITHOUT CONTRAST TECHNIQUE: Multiplanar, multiecho pulse sequences of the brain and surrounding structures were obtained without intravenous contrast. COMPARISON:  None. FINDINGS: Brain: No acute infarct, mass effect or extra-axial collection. Single focus of chronic microhemorrhage in the left temporal lobe. There is multifocal hyperintense T2-weighted signal within the white matter. Parenchymal volume and CSF spaces are normal. There are old left cerebellar infarcts. Vascular: Major flow voids are preserved. Skull and upper cervical spine: Normal calvarium and skull base. Visualized upper cervical spine and soft tissues are normal. Sinuses/Orbits:Right maxillary sinus mucosal thickening. Right mastoid effusion. Normal orbits. IMPRESSION: 1. No acute intracranial abnormality. 2. 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 Electronically Signed   By: Ulyses Jarred M.D.   On: 11/03/2020 23:56   HD orders: AF TTS 4hrs EDW 63.5kg 2K/2Ca  mircera 60 q2wks hectorol 1 TIW  Assessment/Plan **shock,  suspected septic; AMS: appears resolved with IVF and antibiotics; cultures pending.   **ESRD: HD today on TTS schedule.  UF to EDW.  3K 2.5 Ca today. Use TDC, citrate catheter pack with heparin allergy.   **Anemia:  Hb 4/1 9.7, now 8.3 prob operative related.  On mircera outpt  **BMM: cont outpt binder and hectorol.  **HTN: off meds now, normotensive  **Cirrhosis: s/p TIPS, managed with lactulose and rifaxin.  **DM per primary  **HFpEF: euvolemic grossly  Justin Mend 11/04/2020, 3:38 PM

## 2020-11-04 NOTE — ED Notes (Signed)
IM paged to clarify LR bolus orders. This RN was called back and orders will be changed

## 2020-11-04 NOTE — ED Notes (Signed)
Night coverage paged to clarify LR bolus orders for pt.

## 2020-11-04 NOTE — ED Notes (Addendum)
Na x 1

## 2020-11-04 NOTE — Progress Notes (Signed)
Patient arrived in the unit at 2120 pm after having dialysis, alert, no s/s of distress, resting in a bed, VSs are WNL to campare with previous, and will continue to monitor.

## 2020-11-04 NOTE — ED Notes (Signed)
Pt to CT

## 2020-11-04 NOTE — ED Notes (Signed)
Per Dr. Sherry Ruffing hold off on Tylenol for right now and recheck temperature at a later time.

## 2020-11-04 NOTE — ED Notes (Signed)
Providers at bedside.

## 2020-11-04 NOTE — ED Notes (Signed)
Providers notified regarding increase in lactic acid.

## 2020-11-04 NOTE — ED Notes (Signed)
Carb modified lunch tray ordered 

## 2020-11-04 NOTE — ED Notes (Signed)
Pt Vancomycin restarted

## 2020-11-05 DIAGNOSIS — R6521 Severe sepsis with septic shock: Secondary | ICD-10-CM | POA: Diagnosis not present

## 2020-11-05 DIAGNOSIS — G928 Other toxic encephalopathy: Secondary | ICD-10-CM | POA: Diagnosis not present

## 2020-11-05 DIAGNOSIS — N186 End stage renal disease: Secondary | ICD-10-CM | POA: Diagnosis not present

## 2020-11-05 DIAGNOSIS — I10 Essential (primary) hypertension: Secondary | ICD-10-CM | POA: Diagnosis not present

## 2020-11-05 DIAGNOSIS — R4182 Altered mental status, unspecified: Secondary | ICD-10-CM | POA: Diagnosis not present

## 2020-11-05 LAB — GLUCOSE, CAPILLARY
Glucose-Capillary: 86 mg/dL (ref 70–99)
Glucose-Capillary: 319 mg/dL — ABNORMAL HIGH (ref 70–99)

## 2020-11-05 LAB — RENAL FUNCTION PANEL
Albumin: 1.8 g/dL — ABNORMAL LOW (ref 3.5–5.0)
Anion gap: 8 (ref 5–15)
BUN: 20 mg/dL (ref 8–23)
CO2: 24 mmol/L (ref 22–32)
Calcium: 7.5 mg/dL — ABNORMAL LOW (ref 8.9–10.3)
Chloride: 95 mmol/L — ABNORMAL LOW (ref 98–111)
Creatinine, Ser: 3.57 mg/dL — ABNORMAL HIGH (ref 0.61–1.24)
GFR, Estimated: 18 mL/min — ABNORMAL LOW (ref >60)
Glucose, Bld: 383 mg/dL — ABNORMAL HIGH (ref 70–99)
Phosphorus: 2.8 mg/dL (ref 2.5–4.6)
Potassium: 3.4 mmol/L — ABNORMAL LOW (ref 3.5–5.1)
Sodium: 127 mmol/L — ABNORMAL LOW (ref 135–145)

## 2020-11-05 LAB — CBC
HCT: 24.8 % — ABNORMAL LOW (ref 39.0–52.0)
Hemoglobin: 8 g/dL — ABNORMAL LOW (ref 13.0–17.0)
MCH: 27.3 pg (ref 26.0–34.0)
MCHC: 32.3 g/dL (ref 30.0–36.0)
MCV: 84.6 fL (ref 80.0–100.0)
Platelets: 48 10*3/uL — ABNORMAL LOW (ref 150–400)
RBC: 2.93 MIL/uL — ABNORMAL LOW (ref 4.22–5.81)
RDW: 14.9 % (ref 11.5–15.5)
WBC: 3.3 10*3/uL — ABNORMAL LOW (ref 4.0–10.5)
nRBC: 0 % (ref 0.0–0.2)

## 2020-11-05 MED ORDER — POTASSIUM CHLORIDE CRYS ER 20 MEQ PO TBCR: 40.0000 meq | EXTENDED_RELEASE_TABLET | Freq: Once | ORAL | Status: DC

## 2020-11-05 MED ORDER — INSULIN DETEMIR 100 UNIT/ML ~~LOC~~ SOLN
14.0000 [IU] | Freq: Every day | SUBCUTANEOUS | Status: DC
  Filled 2020-11-05: qty 0.14

## 2020-11-05 MED ORDER — VANCOMYCIN HCL IN DEXTROSE 750-5 MG/150ML-% IV SOLN: 750.0000 mg | INTRAVENOUS | Status: DC

## 2020-11-05 NOTE — Progress Notes (Signed)
   Subjective: No acute events overnight. Son initially not at bedside this morning, so phone interpretor was used. Patient's Arabic dialect differs slightly from standard Arabic so communication was slow. However, patient reports he is feeling "fine" this morning, has no complaints. Later in the morning son present at bedside to interpret. He reiterates that the patient is back at his baseline, even more "clear" than yesterday. Patient shares that his chronic cough was bothersome overnight.   Objective:  Vital signs in last 24 hours: Vitals:   11/04/20 2045 11/04/20 2126 11/04/20 2347 11/05/20 0359  BP: (!) 118/46 (!) 119/44 (!) 112/45 (!) 111/52  Pulse: 88 90 87 83  Resp: 18 16 13 18   Temp: 98 F (36.7 C) 98 F (36.7 C) 98.4 F (36.9 C) 98.6 F (37 C)  TempSrc: Oral Oral Oral Oral  SpO2: 100% 99% 98% 97%  Weight: 63.5 kg     Height:       General: chronically ill but non-toxic appearing HEENT: mucous membranes moist Cardiac: RRR. 4/6 systolic murmur appreciated greatest over the RUSB. No LE edema Pulm: breathing comfortably on room air. Lungs clear anteriorly GI: no distension. Not tender Neuro: alert and oriented to person, place, situation, states year is 2021  Assessment/Plan:  Active Problems:   ESRD on dialysis [facility])   AMS (altered mental status)  70 yo male with ESRD on HD THS, liver cirrhosis s/p TIPS, and heart failure admitted for altered mental status evaluation following undergoing a revision of his fistula. So far, infectious workup has been unremarkable and his mental status has returned to baseline.  This is hospital day 2.  Acute toxic and metabolic encephalopathy (resolved). Mental status back to baseline per son. Likely in the setting of sedation for the procedure the day of admission. Although ammonia level was not elevated on admission, suspect this may have also played a role. Low suspicion for infection although renal and liver disease may be masking  typical findings. LA was initially elevated but has now returned to normal values. No suspicious findings on physical exam. No evidence of sepsis at this time. Anticipate discharge soon. - vancomycin and cefepime day 3 - blood cultures no growth to date  ESRD on HD TTS. Appreciate nephrology's assistance. Patient had HD yesterday.  Cirrhosis 2/2 to NAFLD s/p TIPS. Continue lactulose and rifaximin.  Insulin dependent T2DM: fasting CBG this morning 383. Will increase levemir 12>14, continue novolog.  HFpEF. Appearing euvolemic. Continuing to monitor.  Code: FULL   Alexandria Lodge, MD Internal Medicine Resident, PGY-1 [facility] Pager: [phone] 5:57 AM, 11/05/2020   After 5pm on weekdays and 1pm on weekends: On Call pager [phone]

## 2020-11-05 NOTE — Progress Notes (Cosign Needed)
[facility] Progress Note   Subjective:   Pt seen in room, son present to translate. Uneventful dialysis session yesterday evening. Reports mental status is back to baseline. Ate a good breakfast this AM. Denies CP, palpitations, dizziness, SOB, nausea and vomiting.  Objective Vitals:   11/04/20 2126 11/04/20 2347 11/05/20 0359 11/05/20 0748  BP: (!) 119/44 (!) 112/45 (!) 111/52 (!) 116/49  Pulse: 90 87 83 79  Resp: 16 13 18    Temp: 98 F (36.7 C) 98.4 F (36.9 C) 98.6 F (37 C) 98.3 F (36.8 C)  TempSrc: Oral Oral Oral Oral  SpO2: 99% 98% 97% 97%  Weight:      Height:       Physical Exam General: WDWN male, alert and in NAD Heart: RRR, 3/6 systolic murmur, no rubs or gallops Lungs: CTA bilaterally without wheezing, rhonchi or rales Abdomen: Soft, non-tender, non-distended, +BS Extremities: No edema bilateral lower extremities Dialysis Access: LUE AVG + bruit, mild post op edema. R IJ TDC w/dry dressing  Additional Objective Labs: Basic Metabolic Panel: Recent Labs  Lab 11/03/20 1403 11/03/20 1445 11/04/20 0409 11/05/20 0117  NA 135 135 131* 127*  K 4.0 4.0 4.4 3.4*  CL 97* 98 95* 95*  CO2 27  --  23 24  GLUCOSE 161* 155* 304* 383*  BUN 19 20 36* 20  CREATININE 3.74* 3.60* 5.40* 3.57*  CALCIUM 7.6*  --  7.3* 7.5*  PHOS  --   --   --  2.8   Liver Function Tests: Recent Labs  Lab 11/03/20 1403 11/05/20 0117  AST 35  --   ALT 9  --   ALKPHOS 130*  --   BILITOT 1.9*  --   PROT 6.2*  --   ALBUMIN 2.5* 1.8*   No results for input(s): LIPASE, AMYLASE in the last 168 hours. CBC: Recent Labs  Lab 11/03/20 1403 11/03/20 1445 11/04/20 0409 11/04/20 1648 11/05/20 0117  WBC 5.2  --  3.5* 5.2 3.3*  NEUTROABS 3.1  --   --   --   --   HGB 10.4*   < > 8.3* 9.2* 8.0*  HCT 32.5*   < > 26.0* 28.4* 24.8*  MCV 84.2  --  86.7 83.5 84.6  PLT 54*  --  52* 57* 48*   < > = values in this interval not displayed.   Blood Culture    Component Value  Date/Time   SDES BLOOD RIGHT WRIST 11/03/2020 2351   SPECREQUEST  11/03/2020 2351    BOTTLES DRAWN AEROBIC AND ANAEROBIC Blood Culture results may not be optimal due to an inadequate volume of blood received in culture bottles   CULT  11/03/2020 2351    NO GROWTH < 12 HOURS Performed at [facility], [place] [address] [place], [place] [address]    REPTSTATUS PENDING 11/03/2020 2351    Cardiac Enzymes: No results for input(s): CKTOTAL, CKMB, CKMBINDEX, TROPONINI in the last 168 hours. CBG: Recent Labs  Lab 11/04/20 0822 11/04/20 1239 11/04/20 1624 11/04/20 2140 11/05/20 0623  GLUCAP 244* 283* 135* 142* 86   Iron Studies: No results for input(s): IRON, TIBC, TRANSFERRIN, FERRITIN in the last 72 hours. @lablastinr3 @ Studies/Results: DG Chest 2 View  Result Date: 11/03/2020 CLINICAL DATA:  Cough EXAM: CHEST - 2 VIEW COMPARISON:  November 02, 2020 FINDINGS: Dialysis catheter tip is at the cavoatrial junction. No pneumothorax. There is mild atelectasis in the right base. No edema or airspace opacity. Heart is upper normal  in size with pulmonary vascularity normal. No adenopathy. No bone lesions. Coils noted in left upper abdomen. IMPRESSION: Dialysis catheter tip at cavoatrial junction. No pneumothorax. Mild right base atelectasis. No edema or airspace opacity. Heart upper normal in size. Electronically Signed   By: Lowella Grip III M.D.   On: 11/03/2020 15:12   CT Head Wo Contrast  Result Date: 11/03/2020 CLINICAL DATA:  Delirium. EXAM: CT HEAD WITHOUT CONTRAST TECHNIQUE: Contiguous axial images were obtained from the base of the skull through the vertex without intravenous contrast. COMPARISON:  CT head 07/04/2020 FINDINGS: Brain: Patchy and confluent areas of decreased attenuation are noted throughout the deep and periventricular white matter of the cerebral hemispheres bilaterally, compatible with chronic microvascular ischemic disease. No evidence of large-territorial acute  infarction. No parenchymal hemorrhage. No mass lesion. Punctate hyperdensity within the right basal ganglia is noted on prior CT and likely represents mineralization (8:9). No extra-axial collection. No mass effect or midline shift. No hydrocephalus. Basilar cisterns are patent. Vascular: No hyperdense vessel. Skull: No acute fracture or focal lesion. Sinuses/Orbits: Paranasal sinuses and mastoid air cells are clear. The orbits are unremarkable. Other: None. IMPRESSION: No acute intracranial abnormality. Electronically Signed   By: Iven Finn M.D.   On: 11/03/2020 15:34   MR BRAIN WO CONTRAST  Result Date: 11/03/2020 CLINICAL DATA:  Encephalopathy EXAM: MRI HEAD WITHOUT CONTRAST TECHNIQUE: Multiplanar, multiecho pulse sequences of the brain and surrounding structures were obtained without intravenous contrast. COMPARISON:  None. FINDINGS: Brain: No acute infarct, mass effect or extra-axial collection. Single focus of chronic microhemorrhage in the left temporal lobe. There is multifocal hyperintense T2-weighted signal within the white matter. Parenchymal volume and CSF spaces are normal. There are old left cerebellar infarcts. Vascular: Major flow voids are preserved. Skull and upper cervical spine: Normal calvarium and skull base. Visualized upper cervical spine and soft tissues are normal. Sinuses/Orbits:Right maxillary sinus mucosal thickening. Right mastoid effusion. Normal orbits. IMPRESSION: 1. No acute intracranial abnormality. 2. 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 Electronically Signed   By: Ulyses Jarred M.D.   On: 11/03/2020 23:56   Medications: . anticoagulant sodium citrate    . ceFEPime (MAXIPIME) IV 1 g (11/04/20 2351)   . calcium acetate  667 mg Oral TID WC  . Chlorhexidine Gluconate Cloth  6 each Topical Q0600  . doxercalciferol  1 mcg Intravenous Q T,Th,Sa-HD  . insulin aspart  0-15 Units Subcutaneous TID WC  . insulin detemir  14 Units  Subcutaneous QHS  . lactulose  30 g Oral TID  . multivitamin with minerals  1 tablet Oral Daily  . potassium chloride  40 mEq Oral Once  . rifaximin  550 mg Oral BID  . rosuvastatin  5 mg Oral Daily  . sevelamer carbonate  800 mg Oral TID WC  . vancomycin variable dose per unstable renal function (pharmacist dosing)   Does not apply See admin instructions    Outpatient Dialysis Orders: AF TTS 4hrs EDW 63.5kg 2K/2Ca  mircera 60 q2wks hectorol 1 TIW  Assessment/Plan: 1. Acute encephalopathy: Pt altered after AVF procedure. Initially thought to be infectious due to lactic acid but lactic acid level now returned to normal and blood cultures NGTD. Likely due to sedation from procedure. Mental status improved.  2. ESRD: On TTS schedule, had HD yesterday and tolerated well. K+ 3.4 this AM after HD yesterday evening, likely due to post HD electrolyte shifts, anticipate this will self correct. Using [facility], citrate catheter pack  due to heparin allergy.  3. HTN/volume:  BP controlled, UF 1.5L with HD yesterday. Euvolemic on exam today.  4. Anemia: Hgb 8.0. Continue mircera.  5. Secondary hyperparathyroidism:  Corrected calcium and phosphorus controlled. Continue hectorol and phosphorus binder.  6. Cirrhosis: S/p TIPS, managed with lactulose and rifaxin.  7. Nutrition: Continue renal diet, protein supplements and renal vitamin  8. DM: Management per primary team   Anice Paganini, PA-C 11/05/2020, 11:17 AM  [facility] Pager: [phone]

## 2020-11-05 NOTE — Discharge Summary (Signed)
Name: William [name] MRN: [id number] DOB: [birth year] 70 y.o. PCP: William Pollen, MD  Date of Admission: 11/03/2020 12:59 PM Date of Discharge: 11/05/20 Attending Physician: William Groves, DO  Discharge Diagnosis:  1. Acute toxic, metabolic encephalopathy 2. Microscopic hematuria  Discharge Medications: Allergies as of 11/05/2020      Reactions   Heparin Other (See Comments)   Patient is Muslim and is not permitted Pork derative due to religious belief)   Pork-derived Products Other (See Comments)   Patient does not eat pork due to religious beliefs      Medication List    STOP taking these medications   pantoprazole 40 MG tablet Commonly known as: PROTONIX     TAKE these medications   calcium acetate 667 MG capsule Commonly known as: PHOSLO Take 1 capsule (667 mg total) by mouth 3 (three) times daily with meals.   Insulin Pen Needle 31G X 5 MM Misc Commonly known as: Advocate Insulin Pen Needles Use four times daily to inject insulin   insulin regular 100 units/mL injection Commonly known as: NOVOLIN R Inject 6-8 Units into the skin See admin instructions. Inject 8 units subcutaneously with breakfast, and 6 units with lunch and supper   lactulose 10 GM/15ML solution Commonly known as: CHRONULAC Take 45 mLs (30 g total) by mouth 3 (three) times daily. What changed:   when to take this  additional instructions   lidocaine-prilocaine cream Commonly known as: EMLA Apply 1 application topically See admin instructions. Apply topically prior to dialysis on Tuesday, Thursday, Saturday   MIRCERA IJ 1 Dose See admin instructions. Dialysis gives this medication   multivitamin with minerals Tabs tablet Take 1 tablet by mouth daily.   mupirocin ointment 2 % Commonly known as: BACTROBAN Apply 1 application topically daily.   ondansetron 4 MG disintegrating tablet Commonly known as: Zofran ODT Take 1 tablet (4 mg total) by mouth 2 (two) times daily as needed  for nausea or vomiting.   ONE TOUCH LANCETS Misc Use to test blood sugar three times daily   OneTouch Verio test strip Generic drug: glucose blood USE 1 STRIP TO CHECK GLUCOSE THREE TIMES DAILY. DX:E11.65   oxyCODONE-acetaminophen 5-325 MG tablet Commonly known as: Percocet Take 1 tablet by mouth every 4 (four) hours as needed for severe pain.   rifaximin 550 MG Tabs tablet Commonly known as: XIFAXAN Take 1 tablet (550 mg total) by mouth 2 (two) times daily.   rosuvastatin 5 MG tablet Commonly known as: CRESTOR Take 1 tablet (5 mg total) by mouth daily.   sevelamer carbonate 800 MG tablet Commonly known as: RENVELA Take 800 mg by mouth 3 (three) times daily.   William [name] 100 UNIT/ML [name] Pen Generic drug: insulin degludec Inject 14 Units into the skin daily. Inject 14 units under the skin once daily. NO FURTHER REFILLS UNTIL PATIENT IS SEEN IN THE OFFICE What changed:   how much to take  when to take this  additional instructions       Disposition and follow-up:   William [name] was discharged from [facility] in Stable condition.  At the hospital follow up visit please address:  Acute toxic, metabolic encephalopathy following sedation for revision of left arm AV fistula. Mental status returned to baseline at time of discharge. Post anesthetic component should not require any follow up however I would recommend making sure that he is having having a sufficient number of bowel movements and titrate up his lactulose  if needed.  ESRD on HD TTS. He underwent revision of left arm AV fistula with interposition 53m PTFE graft and placement of tunneled right IJ HD cath on 11/02/20.  -evaluate site at time of follow up.  Microscopic hematuria. 21-50 RBC/hpf. Suspect ATN in the setting of hypotension during the procedure. -repeat UA in 4 weeks  Labs / imaging needed at time of follow-up: UA  Pending labs/ test needing follow-up: Blood cultures  collected 11/03/20 at 1957 with no growth to date  Follow-up Appointments:  Follow-up Information    [name]. Schedule an appointment as soon as possible for a visit in 1 week(s).   Specialty: Internal Medicine Contact information: 1[place] [id number]  70year old gentleman with past medical history which includes ESRD on dialysis secondary to Goodpasture syndrome, cirrhosis secondary to NAFLD with history of TIPS procedure, type 2 diabetes and heart failure with preserved ejection fraction admitted for altered mental status evaluation following undergoing a revision of his fistula.  Acute toxic and metabolic encephalopathy (resolved) William [name] William GULINOis a 70year old gentleman with past medical history which includes ESRD on dialysis secondary to Goodpasture syndrome, cirrhosis secondary to NAFLD with history of TIPS procedure, type 2 diabetes and heart failure with preserved ejection fraction.  He was brought to the emergency department by his son after increasing confusion following a procedure on March 31 with vascular surgery for graft placement and tunneled hemodialysis catheter.  Of note, during the procedure, his blood pressure had dropped, and he required phenylephrine.  He was noted to have a temperature of 100.48F and a lactic acid of 2.6. Blood cultures were obtained. He was started on broad spectrum antibiotics with vancomycin and cefepime. CXR and head CT were unremarkable for acute pathology. Brain MRI was obtained as well and was unremarkable for acute changes. His lactic acid increased up to 4.2 but then returned to normal levels within a few hours. The remainder of his temperatures were normal, there was no exam findings suggestive of infection, and blood cultures remained without growth on hospital day #2 so antibiotics were discontinued.     Overall, I suspect that his presenting  encephalopathy was primarily driven by sedation that was required from the procedure on the day prior to admission. He did not appear septic during his hospitalization so I would consider septic encephalopathy less likely. No evidence of infection on initial workup.  Suspect Leukocytosis and elevated lactic acid due to recent procedure. Sepsis ruled out.There was also the consideration of hepatic encephalopathy which he has been admitted for several times in the past, and may have been a component this admission although it is difficult to definitively know.  Mental status had returned to baseline and he wished to return home on hospital day 2.   ESRD on HD TTS Goodpasture syndrome [facility] after missing his usual Thursday HD session due to his procedure. Nephrology was consulted on admission and patient was taken for HD on Saturday, 4/2. He will continue his TTS schedule at discharge.   Cirrhosis 2/2 to NAFLD s/p TIPS. He was continued on his home lactulose and rifaximin.   Insulin dependent T2DM: Last A1c 6.2 earlier in the month (10/2020). He takes [place] units daily and novolin 6-8 units with meals. His BG were controlled on 12 u Lantus  and novolog. Discharged on home regimen.  HFpEF Euvolemic on exam during admission.   ~~~~~~~~~~~~~~~~~~~~~~~~~~~~~~~~~~~~~~~~~~~~~~~~~~~~~~~~~~~~~~~~~~~~~~~~~~~~~~~~~~~~~~~  Pertinent Labs, Studies, and Procedures:   DG Chest 2 View Result Date: 11/03/2020 CLINICAL DATA:  Cough EXAM: CHEST - 2 VIEW COMPARISON:  November 02, 2020 FINDINGS: Dialysis catheter tip is at the cavoatrial junction. No pneumothorax. There is mild atelectasis in the right base. No edema or airspace opacity. Heart is upper normal in size with pulmonary vascularity normal. No adenopathy. No bone lesions. Coils noted in left upper abdomen. IMPRESSION: Dialysis catheter tip at cavoatrial junction. No pneumothorax. Mild right base  atelectasis. No edema or airspace opacity. Heart upper normal in size. Electronically Signed   By: Lowella Grip III M.D.   On: 11/03/2020 15:12   CT Head Wo Contrast Result Date: 11/03/2020 CLINICAL DATA:  Delirium. EXAM: CT HEAD WITHOUT CONTRAST TECHNIQUE: Contiguous axial images were obtained from the base of the skull through the vertex without intravenous contrast. COMPARISON:  CT head 07/04/2020 FINDINGS: Brain: Patchy and confluent areas of decreased attenuation are noted throughout the deep and periventricular white matter of the cerebral hemispheres bilaterally, compatible with chronic microvascular ischemic disease. No evidence of large-territorial acute infarction. No parenchymal hemorrhage. No mass lesion. Punctate hyperdensity within the right basal ganglia is noted on prior CT and likely represents mineralization (8:9). No extra-axial collection. No mass effect or midline shift. No hydrocephalus. Basilar cisterns are patent. Vascular: No hyperdense vessel. Skull: No acute fracture or focal lesion. Sinuses/Orbits: Paranasal sinuses and mastoid air cells are clear. The orbits are unremarkable. Other: None. IMPRESSION: No acute intracranial abnormality. Electronically Signed   By: Iven Finn M.D.   On: 11/03/2020 15:34   MR BRAIN WO CONTRAST Result Date: 11/03/2020 CLINICAL DATA:  Encephalopathy EXAM: MRI HEAD WITHOUT CONTRAST TECHNIQUE: Multiplanar, multiecho pulse sequences of the brain and surrounding structures were obtained without intravenous contrast. COMPARISON:  None. FINDINGS: Brain: No acute infarct, mass effect or extra-axial collection. Single focus of chronic microhemorrhage in the left temporal lobe. There is multifocal hyperintense T2-weighted signal within the white matter. Parenchymal volume and CSF spaces are normal. There are old left cerebellar infarcts. Vascular: Major flow voids are preserved. Skull and upper cervical spine: Normal calvarium and skull base. Visualized  upper cervical spine and soft tissues are normal. Sinuses/Orbits:Right maxillary sinus mucosal thickening. Right mastoid effusion. Normal orbits. IMPRESSION: 1. No acute intracranial abnormality. 2. 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 Electronically Signed   By: Ulyses Jarred M.D.   On: 11/03/2020 23:56    Discharge Instructions: Discharge Instructions    Call MD for:  difficulty breathing, headache or visual disturbances   Complete by: As directed    Call MD for:  extreme fatigue   Complete by: As directed    Call MD for:  persistant dizziness or light-headedness   Complete by: As directed    Call MD for:  persistant nausea and vomiting   Complete by: As directed    Call MD for:  redness, tenderness, or signs of infection (pain, swelling, redness, odor or green/yellow discharge around incision site)   Complete by: As directed    Call MD for:  severe uncontrolled pain   Complete by: As directed    Call MD for:  temperature >100.4   Complete by: As directed    Increase activity slowly   Complete by: As directed    No wound care   Complete by: As directed  Mr. Grays,  It was a pleasure taking care of you in the hospital. You were admitted after you developed confusion following your recent procedure. We evaluated you for possible infection and treated you with 2 days of intravenous antibiotics. Most likely, your confusion was due to sedation from the procedure. Since you are feeling back to your baseline, you are safe to discharge home. Continue your home medications. Please make an appointment to follow-up with your primary care provider in the next week. Continue your Tuesday Thursday Saturday dialysis schedule.  Take care!  Signed: Alexandria Lodge, MD 11/05/2020, 1:34 PM   Pager: [phone]

## 2020-11-06 ENCOUNTER — Other Ambulatory Visit: Payer: Self-pay

## 2020-11-06 ENCOUNTER — Ambulatory Visit (INDEPENDENT_AMBULATORY_CARE_PROVIDER_SITE_OTHER): Payer: Medicare Other | Admitting: Podiatry

## 2020-11-06 DIAGNOSIS — Z89422 Acquired absence of other left toe(s): Secondary | ICD-10-CM | POA: Diagnosis not present

## 2020-11-06 DIAGNOSIS — E1149 Type 2 diabetes mellitus with other diabetic neurological complication: Secondary | ICD-10-CM | POA: Diagnosis not present

## 2020-11-06 DIAGNOSIS — D689 Coagulation defect, unspecified: Secondary | ICD-10-CM | POA: Insufficient documentation

## 2020-11-08 ENCOUNTER — Encounter (HOSPITAL_COMMUNITY): Payer: Self-pay

## 2020-11-08 ENCOUNTER — Inpatient Hospital Stay (HOSPITAL_COMMUNITY)
Admission: EM | Admit: 2020-11-08 | Discharge: 2020-11-10 | DRG: 441 | Disposition: A | Discharge status: Home / Self Care | Payer: Medicare Other | Attending: Internal Medicine | Admitting: Internal Medicine

## 2020-11-08 ENCOUNTER — Emergency Department (HOSPITAL_COMMUNITY): Payer: Medicare Other

## 2020-11-08 ENCOUNTER — Telehealth: Payer: Self-pay | Admitting: Emergency Medicine

## 2020-11-08 ENCOUNTER — Other Ambulatory Visit: Payer: Self-pay

## 2020-11-08 DIAGNOSIS — K7290 Hepatic failure, unspecified without coma: Principal | ICD-10-CM | POA: Diagnosis present

## 2020-11-08 DIAGNOSIS — Z794 Long term (current) use of insulin: Secondary | ICD-10-CM | POA: Diagnosis not present

## 2020-11-08 DIAGNOSIS — N2581 Secondary hyperparathyroidism of renal origin: Secondary | ICD-10-CM | POA: Diagnosis not present

## 2020-11-08 DIAGNOSIS — N186 End stage renal disease: Secondary | ICD-10-CM | POA: Diagnosis not present

## 2020-11-08 DIAGNOSIS — E1129 Type 2 diabetes mellitus with other diabetic kidney complication: Secondary | ICD-10-CM | POA: Diagnosis not present

## 2020-11-08 DIAGNOSIS — D61818 Other pancytopenia: Secondary | ICD-10-CM | POA: Diagnosis present

## 2020-11-08 DIAGNOSIS — E119 Type 2 diabetes mellitus without complications: Secondary | ICD-10-CM | POA: Diagnosis not present

## 2020-11-08 DIAGNOSIS — R4182 Altered mental status, unspecified: Secondary | ICD-10-CM | POA: Diagnosis not present

## 2020-11-08 DIAGNOSIS — K319 Disease of stomach and duodenum, unspecified: Secondary | ICD-10-CM | POA: Diagnosis not present

## 2020-11-08 DIAGNOSIS — Z8249 Family history of ischemic heart disease and other diseases of the circulatory system: Secondary | ICD-10-CM | POA: Diagnosis not present

## 2020-11-08 DIAGNOSIS — E1122 Type 2 diabetes mellitus with diabetic chronic kidney disease: Secondary | ICD-10-CM | POA: Diagnosis not present

## 2020-11-08 DIAGNOSIS — Z833 Family history of diabetes mellitus: Secondary | ICD-10-CM

## 2020-11-08 DIAGNOSIS — I5032 Chronic diastolic (congestive) heart failure: Secondary | ICD-10-CM | POA: Diagnosis present

## 2020-11-08 DIAGNOSIS — H5440 Blindness, one eye, unspecified eye: Secondary | ICD-10-CM | POA: Diagnosis not present

## 2020-11-08 DIAGNOSIS — K7682 Hepatic encephalopathy: Secondary | ICD-10-CM | POA: Diagnosis present

## 2020-11-08 DIAGNOSIS — M310 Hypersensitivity angiitis: Secondary | ICD-10-CM | POA: Diagnosis not present

## 2020-11-08 DIAGNOSIS — I5030 Unspecified diastolic (congestive) heart failure: Secondary | ICD-10-CM | POA: Diagnosis not present

## 2020-11-08 DIAGNOSIS — Z89422 Acquired absence of other left toe(s): Secondary | ICD-10-CM

## 2020-11-08 DIAGNOSIS — K7460 Unspecified cirrhosis of liver: Secondary | ICD-10-CM | POA: Diagnosis not present

## 2020-11-08 DIAGNOSIS — Z20822 Contact with and (suspected) exposure to covid-19: Secondary | ICD-10-CM | POA: Diagnosis not present

## 2020-11-08 DIAGNOSIS — G9341 Metabolic encephalopathy: Secondary | ICD-10-CM | POA: Diagnosis present

## 2020-11-08 DIAGNOSIS — I132 Hypertensive heart and chronic kidney disease with heart failure and with stage 5 chronic kidney disease, or end stage renal disease: Secondary | ICD-10-CM | POA: Diagnosis not present

## 2020-11-08 DIAGNOSIS — K760 Fatty (change of) liver, not elsewhere classified: Secondary | ICD-10-CM | POA: Diagnosis not present

## 2020-11-08 DIAGNOSIS — Z992 Dependence on renal dialysis: Secondary | ICD-10-CM | POA: Diagnosis not present

## 2020-11-08 DIAGNOSIS — R112 Nausea with vomiting, unspecified: Secondary | ICD-10-CM | POA: Diagnosis not present

## 2020-11-08 DIAGNOSIS — D631 Anemia in chronic kidney disease: Secondary | ICD-10-CM | POA: Diagnosis not present

## 2020-11-08 DIAGNOSIS — I10 Essential (primary) hypertension: Secondary | ICD-10-CM | POA: Diagnosis not present

## 2020-11-08 DIAGNOSIS — N250 Renal osteodystrophy: Secondary | ICD-10-CM | POA: Diagnosis not present

## 2020-11-08 DIAGNOSIS — Z888 Allergy status to other drugs, medicaments and biological substances status: Secondary | ICD-10-CM | POA: Diagnosis not present

## 2020-11-08 DIAGNOSIS — G9340 Encephalopathy, unspecified: Secondary | ICD-10-CM | POA: Insufficient documentation

## 2020-11-08 DIAGNOSIS — R410 Disorientation, unspecified: Secondary | ICD-10-CM | POA: Diagnosis not present

## 2020-11-08 LAB — CBC WITH DIFFERENTIAL/PLATELET
Abs Immature Granulocytes: 0 10*3/uL (ref 0.00–0.07)
Basophils Absolute: 0 10*3/uL (ref 0.0–0.1)
Basophils Relative: 1 %
Eosinophils Absolute: 0.1 10*3/uL (ref 0.0–0.5)
Eosinophils Relative: 4 %
HCT: 29.2 % — ABNORMAL LOW (ref 39.0–52.0)
Hemoglobin: 9.4 g/dL — ABNORMAL LOW (ref 13.0–17.0)
Immature Granulocytes: 0 %
Lymphocytes Relative: 32 %
Lymphs Abs: 1.1 10*3/uL (ref 0.7–4.0)
MCH: 27.2 pg (ref 26.0–34.0)
MCHC: 32.2 g/dL (ref 30.0–36.0)
MCV: 84.4 fL (ref 80.0–100.0)
Monocytes Absolute: 0.7 10*3/uL (ref 0.1–1.0)
Monocytes Relative: 20 %
Neutro Abs: 1.4 10*3/uL — ABNORMAL LOW (ref 1.7–7.7)
Neutrophils Relative %: 43 %
Platelets: 43 10*3/uL — ABNORMAL LOW (ref 150–400)
RBC: 3.46 MIL/uL — ABNORMAL LOW (ref 4.22–5.81)
RDW: 15.2 % (ref 11.5–15.5)
WBC: 3.3 10*3/uL — ABNORMAL LOW (ref 4.0–10.5)
nRBC: 0 % (ref 0.0–0.2)

## 2020-11-08 LAB — COMPREHENSIVE METABOLIC PANEL
ALT: 5 U/L (ref 0–44)
AST: 37 U/L (ref 15–41)
Albumin: 2.4 g/dL — ABNORMAL LOW (ref 3.5–5.0)
Alkaline Phosphatase: 143 U/L — ABNORMAL HIGH (ref 38–126)
Anion gap: 10 (ref 5–15)
BUN: 27 mg/dL — ABNORMAL HIGH (ref 8–23)
CO2: 29 mmol/L (ref 22–32)
Calcium: 8 mg/dL — ABNORMAL LOW (ref 8.9–10.3)
Chloride: 98 mmol/L (ref 98–111)
Creatinine, Ser: 5.89 mg/dL — ABNORMAL HIGH (ref 0.61–1.24)
GFR, Estimated: 10 mL/min — ABNORMAL LOW (ref >60)
Glucose, Bld: 244 mg/dL — ABNORMAL HIGH (ref 70–99)
Potassium: 4.4 mmol/L (ref 3.5–5.1)
Sodium: 137 mmol/L (ref 135–145)
Total Bilirubin: 2 mg/dL — ABNORMAL HIGH (ref 0.3–1.2)
Total Protein: 6.1 g/dL — ABNORMAL LOW (ref 6.5–8.1)

## 2020-11-08 LAB — LACTIC ACID, PLASMA
Lactic Acid, Venous: 2.4 mmol/L (ref 0.5–1.9)
Lactic Acid, Venous: 2 mmol/L (ref 0.5–1.9)
Lactic Acid, Venous: 2.6 mmol/L (ref 0.5–1.9)

## 2020-11-08 LAB — RESP PANEL BY RT-PCR (FLU A&B, COVID) ARPGX2
Influenza A by PCR: NEGATIVE
Influenza B by PCR: NEGATIVE
SARS Coronavirus 2 by RT PCR: NEGATIVE

## 2020-11-08 LAB — CBG MONITORING, ED: Glucose-Capillary: 244 mg/dL — ABNORMAL HIGH (ref 70–99)

## 2020-11-08 LAB — AMMONIA: Ammonia: 152 umol/L — ABNORMAL HIGH (ref 9–35)

## 2020-11-08 MED ORDER — ACETAMINOPHEN 325 MG PO TABS: 650.0000 mg | ORAL_TABLET | Freq: Four times a day (QID) | ORAL | Status: DC | PRN

## 2020-11-08 MED ORDER — LACTULOSE ENEMA
300.0000 mL | Freq: Once | ORAL | Status: AC
  Administered 2020-11-08: 300 mL via RECTAL
  Filled 2020-11-08: qty 300

## 2020-11-08 MED ORDER — RIFAXIMIN 550 MG PO TABS
550.0000 mg | ORAL_TABLET | Freq: Two times a day (BID) | ORAL | Status: DC
  Administered 2020-11-09 – 2020-11-10 (×3): 550 mg via ORAL
  Filled 2020-11-08 (×5): qty 1

## 2020-11-08 MED ORDER — LACTULOSE 10 GM/15ML PO SOLN: 20.0000 g | Freq: Once | ORAL | Status: DC

## 2020-11-08 MED ORDER — ACETAMINOPHEN 650 MG RE SUPP: 650.0000 mg | Freq: Four times a day (QID) | RECTAL | Status: DC | PRN

## 2020-11-08 MED ORDER — INSULIN ASPART 100 UNIT/ML ~~LOC~~ SOLN
0.0000 [IU] | Freq: Three times a day (TID) | SUBCUTANEOUS | Status: DC
  Administered 2020-11-09 – 2020-11-10 (×2): 3 [IU] via SUBCUTANEOUS

## 2020-11-08 MED ORDER — LACTULOSE 10 GM/15ML PO SOLN
30.0000 g | Freq: Three times a day (TID) | ORAL | Status: DC
  Administered 2020-11-09 – 2020-11-10 (×4): 30 g via ORAL
  Filled 2020-11-08: qty 45
  Filled 2020-11-08: qty 60
  Filled 2020-11-08 (×4): qty 45

## 2020-11-08 NOTE — Telephone Encounter (Signed)
Patients son called and said that the patient was discharged from the hospital on Saturday. He said that the patient is shaking and having confusion and is not responding. He did not specify what not responding meant. Transferred to [facility].

## 2020-11-08 NOTE — ED Triage Notes (Signed)
Pt arrived via [facility] from home for AMS. Pt LKW 4-01/2021 0000. Pt is NSR on monitor. VSS. Pt alert, but not talking and not following commands. I used interpreter to triage pt, but pt did not respond.

## 2020-11-08 NOTE — ED Notes (Signed)
Unable to get pt to sign MSE due to AMS and no family present

## 2020-11-08 NOTE — H&P (Signed)
Date: 11/08/2020               Patient Name:  William [name] MRN: [id number]  DOB: 02-13-1951 Age / Sex: 70 y.o., male   PCP: Horald Pollen, MD         Medical Service: Internal Medicine Teaching Service         Attending Physician: Dr. Lucious Groves, DO    First Contact: Dr. Shon Baton Pager: [phone]  Second Contact: Dr. Darrick Meigs Pager: [phone]       After Hours (After 5p/  First Contact Pager: [phone]  weekends / holidays): Second Contact Pager: [phone]   Chief Complaint: confusion  History of Present Illness:  [name] is 70yo cismale (he/him) with ESRD on TTS HD, T2DM, HFpEF, Goodpasture syndrome, cirrhosis 2/2 NAFLD s/p TIPS (04/2020), HTN presenting to [facility] for altered mental status. Patient was recently admitted for similar complaint following revision and conversion of AV fistula to graft. At that time presentation was thought to be 2/2 sedation with possible component of hepatic encephalopathy. Rest of history obtained via patient's son due to patient's mental status. After discharge, patient reportedly mentating at baseline. Reportedly had two episodes of non-bloody emesis on Monday, but otherwise doing well. He went to dialysis yesterday and completed a full session. After dialysis patient reportedly weak and tired, but continued to mentate at baseline. This morning, patient's son tried to wake him up around 11:00, but he was minimally responsive and EMS was called. Per patient's son, patient has had 3-4 regular bowel movements daily. He has been taking lactulose once daily and rifaximin twice daily since discharge. Mentions that more than once dose of lactulose gives him diarrhea. Patient's son denies known fevers, chills, sick contacts, chest pain, breathing issues.   Meds:  Current Meds  Medication Sig  . calcium acetate (PHOSLO) 667 MG capsule Take 1 capsule (667 mg total) by mouth 3 (three) times daily with meals.  . insulin degludec (TRESIBA FLEXTOUCH)  100 UNIT/ML FlexTouch Pen Inject 14 Units into the skin daily. Inject 14 units under the skin once daily. NO FURTHER REFILLS UNTIL PATIENT IS SEEN IN THE OFFICE (Patient taking differently: Inject 12 Units into the skin daily before breakfast.)  . insulin regular (NOVOLIN R,HUMULIN R) 100 units/mL injection Inject 6-8 Units into the skin See admin instructions. Inject 8 units subcutaneously with breakfast, and 6 units with lunch and supper  . lactulose (CHRONULAC) 10 GM/15ML solution Take 45 mLs (30 g total) by mouth 3 (three) times daily. (Patient taking differently: Take 30 g by mouth daily.)  . lidocaine-prilocaine (EMLA) cream Apply 1 application topically See admin instructions. Apply topically prior to dialysis on Tuesday, Thursday, Saturday  . Multiple Vitamin (MULTIVITAMIN WITH MINERALS) TABS tablet Take 1 tablet by mouth daily.  . ondansetron (ZOFRAN ODT) 4 MG disintegrating tablet Take 1 tablet (4 mg total) by mouth 2 (two) times daily as needed for nausea or vomiting.  . rifaximin (XIFAXAN) 550 MG TABS tablet Take 1 tablet (550 mg total) by mouth 2 (two) times daily.  . rosuvastatin (CRESTOR) 5 MG tablet Take 1 tablet (5 mg total) by mouth daily.  . sevelamer carbonate (RENVELA) 800 MG tablet Take 800 mg by mouth 3 (three) times daily.   Allergies: Allergies as of 11/08/2020 - Review Complete 11/08/2020  Allergen Reaction Noted  . Heparin Other (See Comments) 11/11/2017  . Pork-derived products Other (See Comments) 02/18/2018   Past Medical History:  Diagnosis Date  .  Anemia   . Blind one eye   . CHF (congestive heart failure) ([place])   . Cirrhosis ([place])   . ESRD on hemodialysis ([place])   . Goodpasture's disease [facility])    on outpatient plasmapheresis/notes 11/20/2017  . Grade I diastolic dysfunction 17/00/1749  . History of anemia due to chronic kidney disease   . History of blood transfusion X 1   "UGIB; low blood count"  . History of plasmapheresis    "qod" (11/20/2017)  .  Hypertension   . Pancytopenia ([place]) 08/20/2017  . Type II diabetes mellitus (HCC)    Family History:  Family History  Problem Relation Age of Onset  . Diabetes Mellitus II Sister   . Diabetes Sister   . Stroke Brother   . Heart attack Brother    Social History:  Patient lives with his son and his family and at baseline can converse normally and ambulate without assistance. Patient's son and his wife handle his medications. Denies history of smoking, alcohol, or recreational drugs.  Review of Systems: A complete ROS was negative except as per HPI.  Physical Exam: Blood pressure (!) 150/55, pulse 85, temperature 98.4 F (36.9 C), temperature source Axillary, resp. rate 16, height 5' 5"  (1.651 m), weight 63.5 kg, SpO2 99 %. General: Laying in bed in no acute distress, non-toxic appearing, non-diaphoretic HENT: Normocephalic, atraumatic. Eyes: Scleral icterus present. Normal lids, no evidence of trauma. CV: Regular rate, rhythm. 2/6 systolic murmur present. Distal pulses 2+ bilaterally. Pulm: Normal work of breathing. Clear to auscultation bilaterally. No wheezing, rhonchi, rales. GI: Soft, non-distended, non-tender. Lower quadrants feel full on palpation. Decreased bowel sounds. MSK: No pitting edema bilaterally. Bilateral legs appear thin. Skin: AV graft L arm without drainage, erythema. Site of L third toe amputation with granulation tissue, no drainage or erythema present. RIJ HD tunneled catheter in place, no surrounding erythema.  Neuro: Awake, intermittently responds to commands, responds to all questions with "yes." Asterixis present.  CBC Latest Ref Rng & Units 11/08/2020 11/05/2020 11/04/2020  WBC 4.0 - 10.5 K/uL 3.3(L) 3.3(L) 5.2  Hemoglobin 13.0 - 17.0 g/dL 9.4(L) 8.0(L) 9.2(L)  Hematocrit 39.0 - 52.0 % 29.2(L) 24.8(L) 28.4(L)  Platelets 150 - 400 K/uL 43(L) 48(L) 57(L)   BMP Latest Ref Rng & Units 11/08/2020 11/05/2020 11/04/2020  Glucose 70 - 99 mg/dL 244(H) 383(H) 304(H)  BUN 8  - 23 mg/dL 27(H) 20 36(H)  Creatinine 0.61 - 1.24 mg/dL 5.89(H) 3.57(H) 5.40(H)  BUN/Creat Ratio 10 - 24 - - -  Sodium 135 - 145 mmol/L 137 127(L) 131(L)  Potassium 3.5 - 5.1 mmol/L 4.4 3.4(L) 4.4  Chloride 98 - 111 mmol/L 98 95(L) 95(L)  CO2 22 - 32 mmol/L 29 24 23   Calcium 8.9 - 10.3 mg/dL 8.0(L) 7.5(L) 7.3(L)   Hepatic Function Latest Ref Rng & Units 11/08/2020 11/05/2020 11/03/2020  Total Protein 6.5 - 8.1 g/dL 6.1(L) - 6.2(L)  Albumin 3.5 - 5.0 g/dL 2.4(L) 1.8(L) 2.5(L)  AST 15 - 41 U/L 37 - 35  ALT 0 - 44 U/L 5 - 9  Alk Phosphatase 38 - 126 U/L 143(H) - 130(H)  Total Bilirubin 0.3 - 1.2 mg/dL 2.0(H) - 1.9(H)  Bilirubin, Direct 0.1 - 0.5 mg/dL - - -   Ammonia 152  EKG: personally reviewed my interpretation is normal sinus rhythm, prolonged QT (no change from 4/1)  CT head w/o contrast: No acute intracranial pathology. Mild age-related atrophy and chronic microvascular ischemic changes.  Assessment & Plan by Problem: [name]  is 70yo cismale (he/him) with ESRD on TTS HD, T2DM, HFpEF, Goodpasture syndrome, cirrhosis 2/2 NAFLD s/p TIPS (04/2020), HTN admitted 4/6 with acute metabolic encephalopathy most consistent with hepatic encephalopathy.   Active Problems:   Hepatic encephalopathy (HCC)  #Acute metabolic encephalopathy #Hepatic encephalopathy  #Cirrhosis 2/2 NAFLD s/p TIPS, embolization of varices and gastrorenal shunt (04/2020) Patient presenting with 1d hx altered mental status in setting of recent admission for acute encephalopathy 2/2 sedation s/p AV graft revision. On arrival to ED, afebrile, elevated BP, sating well on RA. Initial labs revealed hyperammoniemia, hyperlactatemia, and chronic pancytopenia. CT head negative. Exam notable for asterixis and lower quadrant fullness. Presentation most consistent with hepatic encephalopathy in setting of decreased dosage in ammonia-lowering medications. Will obtain blood cultures given he is high risk, but currently low concern  for infectious etiology. Given he is afebrile and no signs of SBP, will hold off antibiotics. He has chronically elevated lactic acid, most likely due to his multitude of chronic medical issues. He is currently NPO and was unable to undergo bedside swallow evaluation d/t mental status, therefore will give lactulose enema tonight. Once mental status improves, plan to re-start lactulose TID and rifaximin BID. Of note, patient is seen by [facility] for evaluation for transplant, most recent visit in March (next visit scheduled in September). Per chart review, consideration of downsizing TIPS if episodes of hepatic encephalopathy continue. If he is unable to tolerate more than one dose of lactulose daily, will likely need to follow-up sooner. Most recent MELD 23 in January, will re-check coags in AM. - Lactulose enema tonight - Lactulose 30g TID, rifaximin 569m BID pending mental status - F/u BCx - F/u CBC, CMP, coags in AM - NPO, SLP eval - [facility]RD on [facility] TTS #Goodpasture syndrome [facility] EDW 63.5kg. Weight post-HD yesterday 64kg. BP mildly elevated, electrolytes stable. Unclear if he has had normal urine output. Most likely his lower quadrant fullness 2/2 stool burden. If minimal/no urine output can consider bladder scan. Of note, he does have a history of increased PSA with negative biopsy and has been seen by urology in the past. [facility]. Will need to consult nephrology in AM for scheduled dialysis tomorrow. - Nephrology consult in AM - F/u BMP - Strict I&O's  #Type 2 diabetes mellitus Last A1c 6.2 in March. He is insulin dependent and takes 14U degludec daily and 6-8U Novolin BID. Glucose 244 on arrival. Given he is NPO will hold off re-staring insulin. - CBG monitoring QID - SSI  #Duodenal mass Per chart review, recent CT abdomen with  1.1cm lesion within duodenum, which appears similar to prior imaging in 2020. Per the report, this is concerning for a neuroendocrine tumor and is being referred for EUS for further evaluation. Patient has an appointment with [facility] 4/27. - F/u oncology 11/29/20  #HFpEF As part of the evaluation for transplantation, he recently underwent pharmacologic stress test. This revealed normal myocardial perfusion and normal LV function. He is currently not on any medications. Patient is currently asymptomatic, no chest pain or signs of hypervolemia. Will continue to monitor.  #Hypertension Patient currently not on antihypertensives. Previously on coreg but stopped in December 2/2 hypotension. BP mildly elevated in ED. Expect to improve with HD tomorrow.   Dispo: Admit patient to Inpatient with expected length of stay greater than 2 midnights.  Signed:Collene Gobble  Doren Custard, MD 11/08/2020, 7:24 PM  Pager: [phone] After 5pm on weekdays and 1pm on weekends: On Call pager: [phone]

## 2020-11-08 NOTE — Hospital Course (Signed)
Admitted 11/08/2020  Allergies: Heparin and Pork-derived products Pertinent Hx: ESRD on HD TRS, cirrhosis 2/2 NAFLD s/p TIPS, DM, HFpEF  70 y.o. male p/w AMS  * Hepatic encephalopathy: Recent admission for similar, improved to BL. Generally weak, confused this morning when son went to go get him up. No fevers, WBC 3.3. LA 2.4. Ammonia 142. CT head negative. Given lactulose enema.   *ESRD: Last got dialysis Tuesday, no emergent needs. Needs nephro consult if stays in hospital  Consults:   Meds: Lactulose enema VTE ppx: SCDs IVF: None Diet: NPO

## 2020-11-08 NOTE — Telephone Encounter (Signed)
[facility] Documentation:  ---He sat down in the bed and now wont wake up even to shaking. He is diabetic. While on the phone He is now awake and he wont answer. He is breathing ,and blood sugar is 240 one hour ago. Acts confused.  Advised to all EMS 911, patient understood

## 2020-11-08 NOTE — ED Provider Notes (Signed)
[facility] EMERGENCY DEPARTMENT Provider Note   CSN: [id number] Arrival date & time: 11/08/20  1443     History Chief Complaint  Patient presents with  . Altered Mental Status    William [name] is a 70 y.o. male.  70 year old male with history of CHF, end-stage renal disease (on dialysis), diabetes, hypertension, cirrhosis with ascites, brought in by EMS from home, family reports patient was not responding earlier today which prompted family to call 911. Patient was admitted to the hospital 6/1-6/3, family reports history of high ammonia. Level 5 caveat applies.         Past Medical History:  Diagnosis Date  . Anemia   . Blind one eye   . CHF (congestive heart failure) ([place])   . Cirrhosis ([place])   . ESRD on hemodialysis ([place])   . Goodpasture's disease [facility])    on outpatient plasmapheresis/notes 11/20/2017  . Grade I diastolic dysfunction 97/35/3299  . History of anemia due to chronic kidney disease   . History of blood transfusion X 1   "UGIB; low blood count"  . History of plasmapheresis    "qod" (11/20/2017)  . Hypertension   . Pancytopenia ([place]) 08/20/2017  . Type II diabetes mellitus [facility])     Patient Active Problem List   Diagnosis Date Noted  . AMS (altered mental status) 11/03/2020  . Diabetic foot infection ([place]) 10/06/2020  . Gangrene of toe of left foot ([place])   . Goals of care, counseling/discussion   . Palliative care by specialist   . AV fistula thrombosis, initial encounter ([place]) 06/01/2019  . AV fistula occlusion, initial encounter ([place])   . Chronic systolic CHF (congestive heart failure) ([place])   . Esophageal varices ([place]) 09/23/2018  . Thrombocytopenia ([place]) 09/23/2018  . CHF (congestive heart failure) ([place]) 09/22/2018  . ESRD on dialysis ([place]) 02/06/2018  . CKD (chronic kidney disease) stage 4, GFR 15-29 ml/min (HCC) 12/19/2017  . Cirrhosis of liver with ascites ([place]) 12/09/2017  . Goodpasture's syndrome ([place]) with associated  hemoptysis 11/20/2017  . Pancytopenia ([place]) 11/06/2017  . Essential hypertension 11/06/2017  . Type II diabetes mellitus with renal manifestations ([place]) 08/25/2015    Past Surgical History:  Procedure Laterality Date  . A/V FISTULAGRAM Left 10/16/2020   Procedure: A/V FISTULAGRAM;  Surgeon: Waynetta Sandy, MD;  Location: [place] CV LAB;  Service: Cardiovascular;  Laterality: Left;  . AMPUTATION TOE Left 10/07/2020   Procedure: AMPUTATION THIRD TOE;  Surgeon: Criselda Peaches, DPM;  Location: [place];  Service: Podiatry;  Laterality: Left;  . AV FISTULA PLACEMENT Left 12/26/2017   Procedure: LEFT BRACHIOCEPHALIC ARTERIOVENOUS (AV) FISTULA CREATION;  Surgeon: Conrad [place], MD;  Location: [place];  Service: Vascular;  Laterality: Left;  . AV FISTULA PLACEMENT Left 11/02/2020   Procedure: INSERTION OF ARTERIOVENOUS (AV) GORE-TEX GRAFT ARM;  Surgeon: Waynetta Sandy, MD;  Location: [place];  Service: Vascular;  Laterality: Left;  . BASCILIC VEIN TRANSPOSITION Left 02/16/2018   Procedure: BRACHIOBASILIC VEIN TRANSPOSITION SECOND STAGE;  Surgeon: Conrad Morris, MD;  Location: [place];  Service: Vascular;  Laterality: Left;  . CATARACT EXTRACTION W/ INTRAOCULAR LENS  IMPLANT, BILATERAL Bilateral   . ESOPHAGEAL BANDING N/A 04/01/2018   Procedure: ESOPHAGEAL BANDING;  Surgeon: Otis Brace, MD;  Location: [facility];  Service: Gastroenterology;  Laterality: N/A;  . ESOPHAGEAL BANDING N/A 06/29/2018   Procedure: ESOPHAGEAL BANDING;  Surgeon: Otis Brace, MD;  Location: [facility];  Service: Gastroenterology;  Laterality:  N/A;  . ESOPHAGOGASTRODUODENOSCOPY (EGD) WITH PROPOFOL N/A 04/01/2018   Procedure: ESOPHAGOGASTRODUODENOSCOPY (EGD) WITH PROPOFOL;  Surgeon: Otis Brace, MD;  [facility]: [facility];  Service: Gastroenterology;  Laterality: N/A;  . ESOPHAGOGASTRODUODENOSCOPY (EGD) WITH PROPOFOL N/A 06/29/2018   Procedure: ESOPHAGOGASTRODUODENOSCOPY (EGD) WITH PROPOFOL;   Surgeon: Otis Brace, MD;  [facility];  Service: Gastroenterology;  Laterality: N/A;  . ESOPHAGOGASTRODUODENOSCOPY (EGD) WITH PROPOFOL N/A 09/30/2018   Procedure: ESOPHAGOGASTRODUODENOSCOPY (EGD) WITH PROPOFOL;  Surgeon: Otis Brace, MD;  [facility];  Service: Gastroenterology;  Laterality: N/A;  . ESOPHAGOGASTRODUODENOSCOPY (EGD) WITH PROPOFOL N/A 04/20/2019   Procedure: ESOPHAGOGASTRODUODENOSCOPY (EGD) WITH PROPOFOL;  Surgeon: Otis Brace, MD;  [facility];  Service: Gastroenterology;  Laterality: N/A;  . ESOPHAGOGASTRODUODENOSCOPY (EGD) WITH PROPOFOL N/A 04/17/2020   Procedure: ESOPHAGOGASTRODUODENOSCOPY (EGD) WITH PROPOFOL;  Surgeon: Wonda Horner, MD;  Location: [place];  Service: Gastroenterology;  Laterality: N/A;  . I & D EXTREMITY Left 02/18/2018   Procedure: IRRIGATION AND DEBRIDEMENT LEFT ARM;  Surgeon: Serafina Mitchell, MD;  Location[facility];  Service: Vascular;  Laterality: Left;  . INSERTION OF DIALYSIS CATHETER N/A 01/19/2018   Procedure: INSERTION OF DIALYSIS CATHETER - RIGHT INTERNAL JUGULAR PLACEMENT;  Surgeon: Rosetta Posner, MD;  Location: [place];  Service: Vascular;  Laterality: N/A;  . INSERTION OF DIALYSIS CATHETER Right 11/02/2020   Procedure: INSERTION OF TUNNELED DIALYSIS CATHETER;  Surgeon: Waynetta Sandy, MD;  Location: [place];  Service: Vascular;  Laterality: Right;  . IR FLUORO GUIDE CV LINE RIGHT  11/10/2017  . IR PARACENTESIS  12/10/2017  . IR PARACENTESIS  12/26/2017  . IR REMOVAL TUN CV CATH W/O FL  11/28/2017  . IR THROMBECTOMY AV FISTULA W/THROMBOLYSIS/PTA INC/SHUNT/IMG LEFT Left 06/02/2019  . IR US GUIDE VASC ACCESS LEFT  06/02/2019  . IR US GUIDE VASC ACCESS RIGHT  11/10/2017  . NECK SURGERY  2007   "back of neck; no hardware in there"  . REVISON OF ARTERIOVENOUS FISTULA Left 11/02/2020   Procedure: REVISON OF ARTERIOVENOUS FISTULA;  Surgeon: Waynetta Sandy, MD;  Location[facility];  Service:  Vascular;  Laterality: Left;       Family History  Problem Relation Age of Onset  . Diabetes Mellitus II Sister   . Diabetes Sister   . Stroke Brother   . Heart attack Brother     Social History   Tobacco Use  . Smoking status: Never Smoker  . Smokeless tobacco: Never Used  Vaping Use  . Vaping Use: Never used  Substance Use Topics  . Alcohol use: Never  . Drug use: Never    Home Medications Prior to Admission medications   Medication Sig Start Date End Date Taking? Authorizing Provider  calcium acetate (PHOSLO) 667 MG capsule Take 1 capsule (667 mg total) by mouth 3 (three) times daily with meals. 05/11/20   Aline August, MD  glucose blood (ONETOUCH VERIO) test strip USE 1 STRIP TO CHECK GLUCOSE THREE TIMES DAILY. DX:E11.65 10/18/20   Elayne Snare, MD  insulin degludec (TRESIBA FLEXTOUCH) 100 UNIT/ML FlexTouch Pen Inject 14 Units into the skin daily. Inject 14 units under the skin once daily. NO FURTHER REFILLS UNTIL PATIENT IS SEEN IN THE OFFICE Patient taking differently: Inject 12 Units into the skin daily before breakfast. 09/04/20   Elayne Snare, MD  Insulin Pen Needle (ADVOCATE INSULIN PEN NEEDLES) 31G X 5 MM MISC Use four times daily to inject insulin 04/27/18   Elayne Snare, MD  insulin regular (NOVOLIN R,HUMULIN R) 100  units/mL injection Inject 6-8 Units into the skin See admin instructions. Inject 8 units subcutaneously with breakfast, and 6 units with lunch and supper    [provider]  lactulose (CHRONULAC) 10 GM/15ML solution Take 45 mLs (30 g total) by mouth 3 (three) times daily. Patient taking differently: Take 30 g by mouth See admin instructions. Take 45 mls (30 g) by mouth every morning, take 45 mls (30 g) in the afternoon as needed for constipation 05/11/20   Aline August, MD  lidocaine-prilocaine (EMLA) cream Apply 1 application topically See admin instructions. Apply topically prior to dialysis on Tuesday, Thursday, Saturday 08/24/18   [provider]  Methoxy PEG-Epoetin Beta (MIRCERA IJ) 1 Dose See admin instructions. Dialysis gives this medication 05/09/20 05/08/21  [provider]  Multiple Vitamin (MULTIVITAMIN WITH MINERALS) TABS tablet Take 1 tablet by mouth daily.    [provider]  mupirocin ointment (BACTROBAN) 2 % Apply 1 application topically daily. 10/26/20   McDonald, Stephan Minister, DPM  ondansetron (ZOFRAN ODT) 4 MG disintegrating tablet Take 1 tablet (4 mg total) by mouth 2 (two) times daily as needed for nausea or vomiting. 05/01/20   [name], Madelynn Done C, PA-C  ONE TOUCH LANCETS MISC Use to test blood sugar three times daily 04/27/18   Elayne Snare, MD  oxyCODONE-acetaminophen (PERCOCET) 5-325 MG tablet Take 1 tablet by mouth every 4 (four) hours as needed for severe pain. 11/02/20 11/02/21  Waynetta Sandy, MD  rifaximin (XIFAXAN) 550 MG TABS tablet Take 1 tablet (550 mg total) by mouth 2 (two) times daily. 07/07/20   Shawna Clamp, MD  rosuvastatin (CRESTOR) 5 MG tablet Take 1 tablet (5 mg total) by mouth daily. 10/18/20   Elayne Snare, MD  sevelamer carbonate (RENVELA) 800 MG tablet Take 800 mg by mouth 3 (three) times daily. 09/25/20   [provider]    Allergies    Heparin and Pork-derived products  Review of Systems   Review of Systems  Unable to perform ROS: Mental status change    Physical Exam Updated Vital Signs BP (!) 158/61   Pulse 88   Temp 98.4 F (36.9 C) (Axillary)   Resp 14   Ht 5' 5"  (1.651 m)   Wt 63.5 kg   SpO2 99%   BMI 23.30 kg/m   Physical Exam Vitals and nursing note reviewed.  Constitutional:      General: He is not in acute distress.    Appearance: He is not ill-appearing.  HENT:     Head: Normocephalic and atraumatic.     Mouth/Throat:     Mouth: Mucous membranes are moist.  Eyes:     Extraocular Movements: Extraocular movements intact.     Pupils: Pupils are equal, round, and reactive to light.  Cardiovascular:     Rate and Rhythm: Normal  rate and regular rhythm.     Pulses: Normal pulses.     Heart sounds: Normal heart sounds.  Pulmonary:     Breath sounds: Normal breath sounds.  Abdominal:     Palpations: Abdomen is soft.     Tenderness: There is generalized abdominal tenderness. There is guarding.  Musculoskeletal:        General: No swelling or tenderness.     Right lower leg: No edema.     Left lower leg: No edema.  Skin:    General: Skin is warm and dry.     Findings: No erythema or rash.  Neurological:     Mental Status: He  is disoriented.     Comments: Moves extremities equally, does not follow commands but may be language barrier     ED Results / Procedures / Treatments   Labs (all labs ordered are listed, but only abnormal results are displayed) Labs Reviewed  CBC WITH DIFFERENTIAL/PLATELET - Abnormal; Notable for the following components:      Result Value   WBC 3.3 (*)    RBC 3.46 (*)    Hemoglobin 9.4 (*)    HCT 29.2 (*)    Platelets 43 (*)    Neutro Abs 1.4 (*)    All other components within normal limits  COMPREHENSIVE METABOLIC PANEL - Abnormal; Notable for the following components:   Glucose, Bld 244 (*)    BUN 27 (*)    Creatinine, Ser 5.89 (*)    Calcium 8.0 (*)    Total Protein 6.1 (*)    Albumin 2.4 (*)    Alkaline Phosphatase 143 (*)    Total Bilirubin 2.0 (*)    GFR, Estimated 10 (*)    All other components within normal limits  AMMONIA - Abnormal; Notable for the following components:   Ammonia 152 (*)    All other components within normal limits  LACTIC ACID, PLASMA - Abnormal; Notable for the following components:   Lactic Acid, Venous 2.4 (*)    All other components within normal limits  RESP PANEL BY RT-PCR (FLU A&B, COVID) [id number]  URINALYSIS, ROUTINE W REFLEX MICROSCOPIC  URINALYSIS, ROUTINE W REFLEX MICROSCOPIC  LACTIC ACID, PLASMA  LACTIC ACID, PLASMA  CBG MONITORING, ED    EKG EKG Interpretation  Date/Time:  Wednesday November 08 2020 14:48:10 EDT Ventricular  Rate:  90 PR Interval:  147 QRS Duration: 74 QT Interval:  426 QTC Calculation: 522 R Axis:   21 Text Interpretation: Sinus rhythm Prolonged QT interval No significant change since last tracing Confirmed by Blanchie Dessert ([id number]) on 11/08/2020 6:15:59 PM   Radiology CT Head Wo Contrast  Result Date: 11/08/2020 CLINICAL DATA:  70 year old male with altered mental status. EXAM: CT HEAD WITHOUT CONTRAST TECHNIQUE: Contiguous axial images were obtained from the base of the skull through the vertex without intravenous contrast. COMPARISON:  Head CT dated 11/03/2020. FINDINGS: Brain: Mild age-related atrophy and chronic microvascular ischemic changes. There is no acute intracranial hemorrhage. No mass effect or midline shift no extra-axial fluid collection. Vascular: No hyperdense vessel or unexpected calcification. Skull: Normal. Negative for fracture or focal lesion. Sinuses/Orbits: Diffuse mucoperiosteal thickening of paranasal sinuses. No air-fluid. The mastoid air cells are clear. Other: None IMPRESSION: 1. No acute intracranial pathology. 2. Mild age-related atrophy and chronic microvascular ischemic changes. Electronically Signed   By: Anner Crete M.D.   On: 11/08/2020 16:10    Procedures Procedures   Medications Ordered in ED Medications  lactulose (CHRONULAC) enema 200 gm (has no administration in time range)    ED Course  I have reviewed the triage vital signs and the nursing notes.  Pertinent labs & imaging results that were available during my care of the patient were reviewed by me and considered in my medical decision making (see chart for details).  Clinical Course as of 11/08/20 1905  Wed Nov 08, 2020  1516 Call to son, patient was confused today, not responding, laying down. Call to dr and spoke with nurse who recommended ER, this occurred around 11:30am today when son went to get him up for the day. Yesterday patient was generally weak and not feeling well but was able  to ambulate to the bathroom. Had dialysis yesterday, goes Tuesday, Thursday, Saturday.  Vomiting on Monday, states this is normal for him, blood sugar was high.  [LM]  [id number] 70 year old male brought in by EMS for altered mental status today.  History obtained from patient's son via telephone, son is now at bedside.  Son states patient has had similar problems in the past when his ammonia is high.  Patient did go to dialysis yesterday, attends Tuesday, Thursday, Saturday.  Dates that he has had vomiting on Monday his blood sugar has been higher than usual. Son went to get patient out of bed at 1130 this morning and states that he was not responding to him as usual so he called the ambulance. Reports compliance of medications including patient's lactulose. On exam, patient moves all extremities equally however does not follow commands. CT head does not show anything acute.  CBC consistent with prior, CMP without significant changes from prior, creatinine increased.  Ammonia is elevated at 152. Acid is 2.4, similar to prior, will trend, due to dialysis will not give IV fluid bolus at this time. Order was given for oral lactulose however patient will not stay awake long enough to take oral lactulose and order was changed to enema.  Hospitalist was paged for consult for admission for hepatic encephalopathy. [LM]    Clinical Course User Index [LM] Roque Lias   MDM Rules/Calculators/A&P                          Final Clinical Impression(s) / ED Diagnoses Final diagnoses:  Hepatic encephalopathy [facility])    Rx / DC Orders ED Discharge Orders    None       Roque Lias 11/08/20 Selena Lesser, MD 11/09/20 1003

## 2020-11-09 ENCOUNTER — Encounter: Payer: Self-pay | Admitting: Podiatry

## 2020-11-09 ENCOUNTER — Encounter (HOSPITAL_COMMUNITY): Payer: Self-pay | Admitting: Internal Medicine

## 2020-11-09 DIAGNOSIS — K7290 Hepatic failure, unspecified without coma: Secondary | ICD-10-CM | POA: Diagnosis not present

## 2020-11-09 DIAGNOSIS — N186 End stage renal disease: Secondary | ICD-10-CM

## 2020-11-09 DIAGNOSIS — E119 Type 2 diabetes mellitus without complications: Secondary | ICD-10-CM | POA: Diagnosis not present

## 2020-11-09 LAB — CBC
HCT: 27.2 % — ABNORMAL LOW (ref 39.0–52.0)
Hemoglobin: 8.9 g/dL — ABNORMAL LOW (ref 13.0–17.0)
MCH: 27.7 pg (ref 26.0–34.0)
MCHC: 32.7 g/dL (ref 30.0–36.0)
MCV: 84.7 fL (ref 80.0–100.0)
Platelets: 48 10*3/uL — ABNORMAL LOW (ref 150–400)
RBC: 3.21 MIL/uL — ABNORMAL LOW (ref 4.22–5.81)
RDW: 14.9 % (ref 11.5–15.5)
WBC: 3.1 10*3/uL — ABNORMAL LOW (ref 4.0–10.5)
nRBC: 0 % (ref 0.0–0.2)

## 2020-11-09 LAB — COMPREHENSIVE METABOLIC PANEL
ALT: 9 U/L (ref 0–44)
AST: 30 U/L (ref 15–41)
Albumin: 2.2 g/dL — ABNORMAL LOW (ref 3.5–5.0)
Alkaline Phosphatase: 127 U/L — ABNORMAL HIGH (ref 38–126)
Anion gap: 9 (ref 5–15)
BUN: 35 mg/dL — ABNORMAL HIGH (ref 8–23)
CO2: 29 mmol/L (ref 22–32)
Calcium: 8.1 mg/dL — ABNORMAL LOW (ref 8.9–10.3)
Chloride: 98 mmol/L (ref 98–111)
Creatinine, Ser: 6.64 mg/dL — ABNORMAL HIGH (ref 0.61–1.24)
GFR, Estimated: 8 mL/min — ABNORMAL LOW (ref >60)
Glucose, Bld: 237 mg/dL — ABNORMAL HIGH (ref 70–99)
Potassium: 4 mmol/L (ref 3.5–5.1)
Sodium: 136 mmol/L (ref 135–145)
Total Bilirubin: 2.1 mg/dL — ABNORMAL HIGH (ref 0.3–1.2)
Total Protein: 5.8 g/dL — ABNORMAL LOW (ref 6.5–8.1)

## 2020-11-09 LAB — PROTIME-INR
INR: 1.3 — ABNORMAL HIGH (ref 0.8–1.2)
Prothrombin Time: 15.7 seconds — ABNORMAL HIGH (ref 11.4–15.2)

## 2020-11-09 LAB — CULTURE, BLOOD (ROUTINE X 2)
Culture: NO GROWTH
Culture: NO GROWTH

## 2020-11-09 LAB — CBG MONITORING, ED
Glucose-Capillary: 223 mg/dL — ABNORMAL HIGH (ref 70–99)
Glucose-Capillary: 259 mg/dL — ABNORMAL HIGH (ref 70–99)

## 2020-11-09 LAB — GLUCOSE, CAPILLARY
Glucose-Capillary: 137 mg/dL — ABNORMAL HIGH (ref 70–99)
Glucose-Capillary: 146 mg/dL — ABNORMAL HIGH (ref 70–99)

## 2020-11-09 MED ORDER — DARBEPOETIN ALFA 60 MCG/0.3ML IJ SOSY: 60.0000 ug | PREFILLED_SYRINGE | INTRAMUSCULAR | Status: DC

## 2020-11-09 MED ORDER — CHLORHEXIDINE GLUCONATE CLOTH 2 % EX PADS
6.0000 | MEDICATED_PAD | Freq: Every day | CUTANEOUS | Status: DC
  Administered 2020-11-10: 6 via TOPICAL

## 2020-11-09 MED ORDER — INSULIN GLARGINE 100 UNIT/ML ~~LOC~~ SOLN
8.0000 [IU] | Freq: Every day | SUBCUTANEOUS | Status: DC
  Administered 2020-11-09: 8 [IU] via SUBCUTANEOUS
  Filled 2020-11-09 (×2): qty 0.08

## 2020-11-09 MED ORDER — ONDANSETRON HCL 4 MG/2ML IJ SOLN
INTRAMUSCULAR | Status: AC
  Administered 2020-11-09: 4 mg via INTRAVENOUS
  Filled 2020-11-09: qty 2

## 2020-11-09 MED ORDER — DOXERCALCIFEROL 4 MCG/2ML IV SOLN: 1.0000 ug | INTRAVENOUS | Status: DC

## 2020-11-09 MED ORDER — ONDANSETRON HCL 4 MG/2ML IJ SOLN: 4.0000 mg | Freq: Once | INTRAMUSCULAR | Status: AC

## 2020-11-09 MED ORDER — PROCHLORPERAZINE EDISYLATE 10 MG/2ML IJ SOLN: 2.5000 mg | Freq: Once | INTRAMUSCULAR | Status: DC

## 2020-11-09 NOTE — Procedures (Signed)
   I was present at this dialysis session, have reviewed the session itself and made  appropriate changes Kelly Splinter MD [place] pager [phone]   11/09/2020, 3:51 PM

## 2020-11-09 NOTE — ED Notes (Signed)
Pt refused in and out cath for UA lab order. Will continue to monitor.

## 2020-11-09 NOTE — Progress Notes (Signed)
  Subjective:  Patient ID: William [name], male    DOB: [birth year],  MRN: [id number]  Chief Complaint  Patient presents with  . Routine Post Op      final suture removal     DOS: 10/07/2020 Procedure: Left third toe amputation  70 y.o. male returns for post-op check.  Doing well.  Minimal pain  Review of Systems: Negative except as noted in the HPI. Denies N/V/F/Ch.   Objective:  There were no vitals filed for this visit. There is no height or weight on file to calculate BMI. Constitutional Well developed. Well nourished.  Vascular Foot warm and well perfused. Capillary refill normal to all digits.   Neurologic Normal speech. Oriented to person, place, and time. Epicritic sensation to light touch grossly present bilaterally.  Dermatologic  incision well-healed without signs of infection  Orthopedic: Tenderness to palpation noted about the surgical site.   Radiographs: N/A Assessment:   1. Type II diabetes mellitus with neurological manifestations (William [name])   2. Status post amputation of lesser toe of left foot (William [name])    Plan:  Patient was evaluated and treated and all questions answered.  S/p foot surgery left -Sutures all removed, well-healed  Return if symptoms worsen or fail to improve.

## 2020-11-09 NOTE — Discharge Summary (Addendum)
Name: ISSAAC [name] MRN: [id number] DOB: 1951-08-01 70 y.o. PCP: Horald Pollen, MD  Date of Admission: 11/08/2020  2:43 PM Date of Discharge: 11/09/2020 Attending Physician: Lucious Groves, DO  Discharge Diagnosis:  1. Hepatic encephalopathy 2. End stage renal disease 3. Type 2 diabetes mellitus   Discharge Medications: Allergies as of 11/10/2020      Reactions   Heparin Other (See Comments)   Patient is Muslim and is not permitted Pork derative due to religious belief)   Pork-derived Products Other (See Comments)   Patient does not eat pork due to religious beliefs      Medication List    TAKE these medications   calcium acetate 667 MG capsule Commonly known as: PHOSLO Take 1 capsule (667 mg total) by mouth 3 (three) times daily with meals.   Insulin Pen Needle 31G X 5 MM Misc Commonly known as: Advocate Insulin Pen Needles Use four times daily to inject insulin   insulin regular 100 units/mL injection Commonly known as: NOVOLIN R Inject 6-8 Units into the skin See admin instructions. Inject 8 units subcutaneously with breakfast, and 6 units with lunch and supper   lactulose 10 GM/15ML solution Commonly known as: CHRONULAC Take 45 mLs (30 g total) by mouth 3 (three) times daily. What changed: when to take this   lidocaine-prilocaine cream Commonly known as: EMLA Apply 1 application topically See admin instructions. Apply topically prior to dialysis on Tuesday, Thursday, Saturday   MIRCERA IJ 1 Dose See admin instructions. Dialysis gives this medication   multivitamin with minerals Tabs tablet Take 1 tablet by mouth daily.   mupirocin ointment 2 % Commonly known as: BACTROBAN Apply 1 application topically daily.   ondansetron 4 MG disintegrating tablet Commonly known as: Zofran ODT Take 1 tablet (4 mg total) by mouth 2 (two) times daily as needed for nausea or vomiting.   ONE TOUCH LANCETS Misc Use to test blood sugar three times daily    OneTouch Verio test strip Generic drug: glucose blood USE 1 STRIP TO CHECK GLUCOSE THREE TIMES DAILY. DX:E11.65   oxyCODONE-acetaminophen 5-325 MG tablet Commonly known as: Percocet Take 1 tablet by mouth every 4 (four) hours as needed for severe pain.   rifaximin 550 MG Tabs tablet Commonly known as: XIFAXAN Take 1 tablet (550 mg total) by mouth 2 (two) times daily.   rosuvastatin 5 MG tablet Commonly known as: CRESTOR Take 1 tablet (5 mg total) by mouth daily.   sevelamer carbonate 800 MG tablet Commonly known as: RENVELA Take 800 mg by mouth 3 (three) times daily.   [name] 100 UNIT/ML FlexTouch Pen Generic drug: insulin degludec Inject 14 Units into the skin daily. Inject 14 units under the skin once daily. NO FURTHER REFILLS UNTIL PATIENT IS SEEN IN THE OFFICE What changed:   how much to take  when to take this  additional instructions       Disposition and follow-up:   William [name] was discharged from [facility] in Stable condition.  At the hospital follow up visit please address:  1.    Hepatic encephalopathy - Mental status returned to baseline the day prior to discharge - Recommend making sure that he is having having a sufficient number of bowel movements and titrate up his lactulose if needed - Patient reports lactulose makes him feel nauseated however typically the nausea resolves after BM - Next [facility] appointment scheduled for 04/18/21. May consider earlier appointment if patient  continues to have frequent episodes of hepatic encephalopathy.  ESRD on HD TTS Recent AVF revision to AVG (11/02/20) He underwent revision of left arm AV fistula with interposition 75m PTFE graft and placement of tunneled right IJ HD cath on 11/02/20. The site was mistakenly used for HD during admission on 11/09/20. Son was advised by nephrology to leave the bandage on and plan for removal and reassessment at HD tomorrow.  - Evaluate site  at time of follow up  Duodenal mass Per chart review, recent CT abdomen with 1.1cm lesion within duodenum, which appears similar to prior imaging in 2020. Per the report, this is concerning for a neuroendocrine tumor and is being referred for EUS for further evaluation. [facility] - F/u oncology 11/29/20  Microscopic hematuria. This was noted previous admission. 21-50 RBC/hpf. Suspect ATN in the setting of hypotension during the procedure. -repeat UA in 4 weeks  2.  Labs / imaging needed at time of follow-up: urinalysis in 4 weeks  3.  Pending labs/ test needing follow-up: blood cultures collected 11/09/20 at 02:24 no growth to date  Follow-up Appointments:  Follow-up Information    [name]. Schedule an appointment as soon as possible for a visit in 1 week(s).   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    Hepatic encephalopathy Nausea and vomiting [name] was briefly admitted to our service last week due to concern for sepsis after some confusion and leukocytosis following a vascular surgery procedure.  He was briefly treated with IV antibiotics until sepsis could be ruled out. Following discharge home he had some increased diarrhea for which his son decreased lactulose administration and patient became progressively confused. On presentation to the emergency department he was unable to provide  any history for himself and ammonia level was markedly elevated and he was treated with lactulose enema with marked improvement.  On evaluation the next morning, patient and son overall feel he is back to his baseline mental status however was experiencing some increased nausea. On review of his previous hospitalizations over the last 6 months presentation with nausea and diarrhea has been frequent issue that appears to improve with some use of Zofran and supportive care/treatment of HE. Per son, while at home patient not infrequently experiences nausea with lactulose administration, but the nausea typically resolves when the patient has a bowel movement. Counseled the patient and son to continue taking all medications as prescribed. Follow-up closely with PCP. Per chart review, consideration of downsizing TIPS if episodes of hepatic encephalopathy continue.  ESRD on HD TTS Goodpasture syndrome Patient maintained his HD schedule while inpatient, undergoing dialysis on 11/09/20. Next HD Saturday, 11/11/20.  ~~~~~~~~~~~~~~~~~~~~~~~~~~~~~~~~~~~~~~~~~~~~~~~~~~~~~~~~~~~~~~~~~~~~~~~~~~~~~~~~~~~~~~~  Day of Discharge Subjective:  No acute events overnight. During evaluation this morning, patient and son report mental status at baseline. Nausea resolved, and patient ate dinner last night and breakfast this morning without issue. Patient's son expresses concern about use of the patient's revised LUE  fistula during HD yesterday. Per nephrology, site was used by mistake. Nephrology advised the son to leave the bandage on until patient's outpatient HD tomorrow.   Discharge Exam:   BP 130/60 (BP Location: Right Arm)   Pulse 79   Temp 99.2 F (37.3 C) (Oral)   Resp 18   Ht 5' 5"  (1.651 m)   Wt 64.1 kg   SpO2 97%   BMI 23.52 kg/m   General: Sitting up in bed in no acute distress, non-toxic appearing, non-diaphoretic CV: No lower extremity edema. Pulm: Normal work of breathing on room air.  Skin: AV graft L  arm with overlying bandage clean/dry/intact, mild swelling and warmth, no erythema. RIJ HD tunneled catheter in place, no surrounding erythema.  Neuro: A&O x 3.  Pertinent Labs, Studies, and Procedures:   CT Head Wo Contrast, Result Date: 11/08/2020  CLINICAL DATA:  70 year old male with altered mental status. EXAM: CT HEAD WITHOUT CONTRAST TECHNIQUE: Contiguous axial images were obtained from the base of the skull through the vertex without intravenous contrast. COMPARISON:  Head CT dated 11/03/2020. FINDINGS: Brain: Mild age-related atrophy and chronic microvascular ischemic changes. There is no acute intracranial hemorrhage. No mass effect or midline shift no extra-axial fluid collection. Vascular: No hyperdense vessel or unexpected calcification. Skull: Normal. Negative for fracture or focal lesion. Sinuses/Orbits: Diffuse mucoperiosteal thickening of paranasal sinuses. No air-fluid. The mastoid air cells are clear. Other: None IMPRESSION: 1. No acute intracranial pathology. 2. Mild age-related atrophy and chronic microvascular ischemic changes. Electronically Signed   By: Anner Crete M.D.   On: 11/08/2020 16:10    Discharge Instructions: Discharge Instructions    Call MD for:  difficulty breathing, headache or visual disturbances   Complete by: As directed    Call MD for:  extreme fatigue   Complete by: As directed    Call MD for:  persistant dizziness or light-headedness   Complete by: As directed    Call MD for:  persistant nausea and vomiting   Complete by: As directed    Call MD for:  redness, tenderness, or signs of infection (pain, swelling, redness, odor or green/yellow discharge around incision site)   Complete by: As directed    Call MD for:  severe uncontrolled pain   Complete by: As directed    Call MD for:  temperature >100.4   Complete by: As directed      Mr. Fineberg,  It was a pleasure taking care of you in the hospital. You were admitted for confusion which was  likely from your liver disease (hepatic encephalopathy). Very likely the antibiotics you received during your last admission caused GI symptoms which then made you unable to tolerate your lactulose. The nausea you experienced during this hospitalization was most likely from taking the lactulose on an empty stomach. Continue to take your medications as directed.   Please follow-up with your primary care doctor in the next week.   Take care!   Signed: Alexandria Lodge, MD 11/10/2020, 10:34 AM   Pager: [phone]

## 2020-11-09 NOTE — ED Notes (Signed)
Pt denies nausea at this time. No vomiting episodes since Zofran IVP administration. Pt is stable, see VS.

## 2020-11-09 NOTE — ED Notes (Signed)
Pt started vomiting at this time and endorses nausea. MD paged. Waiting for new orders.

## 2020-11-09 NOTE — Plan of Care (Signed)
  Problem: Clinical Measurements: Goal: Ability to maintain clinical measurements within normal limits will improve Outcome: Progressing   

## 2020-11-09 NOTE — Progress Notes (Signed)
NEW ADMISSION NOTE New Admission Note:   Arrival Method: Arrived from Hemodialysis in E.D [facility] Mental Orientation:  Alert and oriented but not speaking English,only Arabic language Telemetry:  Assessment: Completed Skin:Skin intact ,assessed with Seth Bake R.N. IV: Right hand sSL Pain: Denies Tubes: Safety Measures: Safety Fall Prevention Plan has been given, discussed and signed Admission: Completed [facility] Orientation: Patient has been orientated to the room, unit and staff.  Family: Son at the bedside.  Orders have been reviewed and implemented. Will continue to monitor the patient. Call light has been placed within reach and bed alarm has been activated.   [place], Zenon Mayo, RN

## 2020-11-09 NOTE — ED Notes (Signed)
Report given to [place]. Pt is currently on dialysis. Dialysis notified and will send pt to 5M14 after dialysis.

## 2020-11-09 NOTE — Consult Note (Signed)
Renal Service Consult Note [facility]  William [name] 11/09/2020 Sol Blazing, MD Requesting Physician: Dr. Heber Geneva  Reason for Consult: ESRD pt w/ AMS HPI: The patient is a 70 y.o. year-old w/ hx of HTN, DM2, ESRD on HD, cirrhosis, hepatic encephalopathy, diast CHF chronic presented to ED on 4/06 for altered mental status. Pt was not acting right at home, laying down and generally weak. The day before he was acting normal and able to walk. Son gave hx of hepatic enceph w/ similar symptoms. In ED ammonia level was [name] and pt improved w/ PR lactulose. This am pt is feeling better but is nauseous w/ vomiting. Per primary team has hx of recurrent issues w/ N/V.  Pt is for admission to medical team. We are asked to see for ESRD.    Pt seen and interviewed w/ son acting as interpreter.  No SOB or cough, no leg swelling , no jerking of extremities. Doesn't miss HD.     ROS  denies CP  no joint pain   no HA  no blurry vision  no rash  no diarrhea   Past Medical History  Past Medical History:  Diagnosis Date  . Anemia   . Blind one eye   . CHF (congestive heart failure) ([place])   . Cirrhosis ([place])   . ESRD on hemodialysis ([place])   . Goodpasture's disease [facility])    on outpatient plasmapheresis/notes 11/20/2017  . Grade I diastolic dysfunction 49/44/9675  . History of anemia due to chronic kidney disease   . History of blood transfusion X 1   "UGIB; low blood count"  . History of plasmapheresis    "qod" (11/20/2017)  . Hypertension   . Pancytopenia ([place]) 08/20/2017  . Type II diabetes mellitus ([place])    Past Surgical History  Past Surgical History:  Procedure Laterality Date  . A/V FISTULAGRAM Left 10/16/2020   Procedure: A/V FISTULAGRAM;  Surgeon: Waynetta Sandy, MD;  Location: [place] [facility];  Service: Cardiovascular;  Laterality: Left;  . AMPUTATION TOE Left 10/07/2020   Procedure: AMPUTATION THIRD TOE;  Surgeon: Criselda Peaches, DPM;  Location: [place];  Service: Podiatry;  Laterality: Left;  . AV FISTULA PLACEMENT Left 12/26/2017   Procedure: LEFT BRACHIOCEPHALIC ARTERIOVENOUS (AV) FISTULA CREATION;  Surgeon: Conrad [place]ville, MD;  Location: [place];  Service: Vascular;  Laterality: Left;  . AV FISTULA PLACEMENT Left 11/02/2020   Procedure: INSERTION OF ARTERIOVENOUS (AV) GORE-TEX GRAFT ARM;  Surgeon: Waynetta Sandy, MD;  Location: [place];  Service: Vascular;  Laterality: Left;  . BASCILIC VEIN TRANSPOSITION Left 02/16/2018   Procedure: BRACHIOBASILIC VEIN TRANSPOSITION SECOND STAGE;  Surgeon: Conrad [place], MD;  Location: [place];  Service: Vascular;  Laterality: Left;  . CATARACT EXTRACTION W/ INTRAOCULAR LENS  IMPLANT, BILATERAL Bilateral   . ESOPHAGEAL BANDING N/A 04/01/2018   Procedure: ESOPHAGEAL BANDING;  Surgeon: Otis Brace, MD;  Location: [facility];  Service: Gastroenterology;  Laterality: N/A;  . ESOPHAGEAL BANDING N/A 06/29/2018   Procedure: ESOPHAGEAL BANDING;  Surgeon: Otis Brace, MD;  Location: [facility];  Service: Gastroenterology;  Laterality: N/A;  . ESOPHAGOGASTRODUODENOSCOPY (EGD) WITH PROPOFOL N/A 04/01/2018   Procedure: ESOPHAGOGASTRODUODENOSCOPY (EGD) WITH PROPOFOL;  Surgeon: Otis Brace, MD;  [facility];  Service: Gastroenterology;  Laterality: N/A;  . ESOPHAGOGASTRODUODENOSCOPY (EGD) WITH PROPOFOL N/A 06/29/2018   Procedure: ESOPHAGOGASTRODUODENOSCOPY (EGD) WITH PROPOFOL;  Surgeon: Otis Brace, MD;  [facility];  Service: Gastroenterology;  Laterality: N/A;  . ESOPHAGOGASTRODUODENOSCOPY (  EGD) WITH PROPOFOL N/A 09/30/2018   Procedure: ESOPHAGOGASTRODUODENOSCOPY (EGD) WITH PROPOFOL;  Surgeon: Otis Brace, MD;  [facility];  Service: Gastroenterology;  Laterality: N/A;  . ESOPHAGOGASTRODUODENOSCOPY (EGD) WITH PROPOFOL N/A 04/20/2019   Procedure: ESOPHAGOGASTRODUODENOSCOPY (EGD) WITH PROPOFOL;  Surgeon: Otis Brace, MD;  [facility];   Service: Gastroenterology;  Laterality: N/A;  . ESOPHAGOGASTRODUODENOSCOPY (EGD) WITH PROPOFOL N/A 04/17/2020   Procedure: ESOPHAGOGASTRODUODENOSCOPY (EGD) WITH PROPOFOL;  Surgeon: Wonda Horner, MD;  Location: [place];  Service: Gastroenterology;  Laterality: N/A;  . I & D EXTREMITY Left 02/18/2018   Procedure: IRRIGATION AND DEBRIDEMENT LEFT ARM;  Surgeon: Serafina Mitchell, MD;  Location: [facility];  Service: Vascular;  Laterality: Left;  . INSERTION OF DIALYSIS CATHETER N/A 01/19/2018   Procedure: INSERTION OF DIALYSIS CATHETER - RIGHT INTERNAL JUGULAR PLACEMENT;  Surgeon: Rosetta Posner, MD;  Location: [place];  Service: Vascular;  Laterality: N/A;  . INSERTION OF DIALYSIS CATHETER Right 11/02/2020   Procedure: INSERTION OF TUNNELED DIALYSIS CATHETER;  Surgeon: Waynetta Sandy, MD;  Location: [place];  Service: Vascular;  Laterality: Right;  . IR FLUORO GUIDE CV LINE RIGHT  11/10/2017  . IR PARACENTESIS  12/10/2017  . IR PARACENTESIS  12/26/2017  . IR REMOVAL TUN CV CATH W/O FL  11/28/2017  . IR THROMBECTOMY AV FISTULA W/THROMBOLYSIS/PTA INC/SHUNT/IMG LEFT Left 06/02/2019  . IR US GUIDE VASC ACCESS LEFT  06/02/2019  . IR US GUIDE VASC ACCESS RIGHT  11/10/2017  . NECK SURGERY  2007   "back of neck; no hardware in there"  . REVISON OF ARTERIOVENOUS FISTULA Left 11/02/2020   Procedure: REVISON OF ARTERIOVENOUS FISTULA;  Surgeon: Waynetta Sandy, MD;  [facility] OR;  Service: Vascular;  Laterality: Left;   Family History  Family History  Problem Relation Age of Onset  . Diabetes Mellitus II Sister   . Diabetes Sister   . Stroke Brother   . Heart attack Brother    Social History  reports that he has never smoked. He has never used smokeless tobacco. He reports that he does not drink alcohol and does not use drugs. Allergies  Allergies  Allergen Reactions  . Heparin Other (See Comments)    Patient is Muslim and is not permitted Pork derative due to religious belief)  .  Pork-Derived Products Other (See Comments)    Patient does not eat pork due to religious beliefs   Home medications Prior to Admission medications   Medication Sig Start Date End Date Taking? Authorizing Provider  calcium acetate (PHOSLO) 667 MG capsule Take 1 capsule (667 mg total) by mouth 3 (three) times daily with meals. 05/11/20  Yes Aline August, MD  insulin degludec (TRESIBA FLEXTOUCH) 100 UNIT/ML FlexTouch Pen Inject 14 Units into the skin daily. Inject 14 units under the skin once daily. NO FURTHER REFILLS UNTIL PATIENT IS SEEN IN THE OFFICE Patient taking differently: Inject 12 Units into the skin daily before breakfast. 09/04/20  Yes Elayne Snare, MD  insulin regular (NOVOLIN R,HUMULIN R) 100 units/mL injection Inject 6-8 Units into the skin See admin instructions. Inject 8 units subcutaneously with breakfast, and 6 units with lunch and supper   Yes [provider]  lactulose (CHRONULAC) 10 GM/15ML solution Take 45 mLs (30 g total) by mouth 3 (three) times daily. Patient taking differently: Take 30 g by mouth daily. 05/11/20  Yes Aline August, MD  lidocaine-prilocaine (EMLA) cream Apply 1 application topically See admin instructions. Apply topically prior to dialysis on Tuesday, Thursday,  Saturday 08/24/18  Yes [provider]  Multiple Vitamin (MULTIVITAMIN WITH MINERALS) TABS tablet Take 1 tablet by mouth daily.   Yes [provider]  ondansetron (ZOFRAN ODT) 4 MG disintegrating tablet Take 1 tablet (4 mg total) by mouth 2 (two) times daily as needed for nausea or vomiting. 05/01/20  Yes [name], Hallie C, PA-C  rifaximin (XIFAXAN) 550 MG TABS tablet Take 1 tablet (550 mg total) by mouth 2 (two) times daily. 07/07/20  Yes Shawna Clamp, MD  rosuvastatin (CRESTOR) 5 MG tablet Take 1 tablet (5 mg total) by mouth daily. 10/18/20  Yes Elayne Snare, MD  sevelamer carbonate (RENVELA) 800 MG tablet Take 800 mg by mouth 3 (three) times daily. 09/25/20  Yes [provider]  glucose blood (ONETOUCH VERIO) test strip USE 1 STRIP TO CHECK GLUCOSE THREE TIMES DAILY. DX:E11.65 10/18/20   Elayne Snare, MD  Insulin Pen Needle (ADVOCATE INSULIN PEN NEEDLES) 31G X 5 MM MISC Use four times daily to inject insulin 04/27/18   Elayne Snare, MD  Methoxy PEG-Epoetin Beta (MIRCERA IJ) 1 Dose See admin instructions. Dialysis gives this medication 05/09/20 05/08/21  [provider]  mupirocin ointment (BACTROBAN) 2 % Apply 1 application topically daily. Patient not taking: No sig reported 10/26/20   Criselda Peaches, DPM  ONE TOUCH LANCETS MISC Use to test blood sugar three times daily 04/27/18   Elayne Snare, MD  oxyCODONE-acetaminophen (PERCOCET) 5-325 MG tablet Take 1 tablet by mouth every 4 (four) hours as needed for severe pain. Patient not taking: No sig reported 11/02/20 11/02/21  Waynetta Sandy, MD     Vitals:   11/09/20 1337 11/09/20 1407 11/09/20 1437 11/09/20 1500  BP: (!) 96/50 (!) 92/45 (!) 99/47 (!) 114/52  Pulse: 81 83 78 80  Resp:      Temp:      TempSrc:      SpO2:      Weight:      Height:       Exam Gen pt having dry heaves  No rash, cyanosis or gangrene Sclera anicteric, throat clear  No jvd or bruits Chest clear bilat to bases RRR no MRG Abd soft ntnd no mass or ascites +bs GU normal male MS no joint effusions or deformity Ext no leg or UE edema, no wounds or ulcer Neuro is alert, Ox 3 , nf   RIJ TDC intact    Home meds:  - lactulose 30 gm tid/ xifaxan 500 bid  - percocet qid prn  - phoslo 1 ac tid/ renvela 800 ac tid  - insulin tresiba 12u qam/ regular insulin 6-8u ac tid  - crestor 5 qd/ prn zofran  - prn's/ vitamins/ supplements    OP HD: TTS AF  4h  400/500  63.5kg  2/2 bath  TDC RIJ   No heparin d/t religious reasons / block catheter w/ Na citrate  - hect 1 ug tiw  - mircera 60ug q2 last 3/29, due 4/12    Na 136  K 4  CO 29  BUN 35  Cr 6.64  Alb 2.2 ca 8.1  Tbili 2.2      WBC 3.1  Hb 8.9  plt  48k  Assessment/ Plan: 1. Altered mental status - w/ Christia Reading and hx hepatic enceph. Improving, rx'd in ED w/ lactulose. Per primary team.  2. Nausea/ vomiting - recurrent issue apparently. Per primary team 3. ESRD - on HD TTS. Good compliance. HD today. No heparin d/t religious reasons.  4. BP/ vol - BP's up, did not tolerate UF on HD. No vol ^by exam 5. Cirrhosis 6. MBD ckd - cont binders when n/v subsides 7. Thrombocytopenia - no bleeding here. No heparin on HD.   8. Anemia ckd - Hb 8.9, next esa 4/12. Plan darbe 60 ug weekly next 4/12.       Kelly Splinter  MD 11/09/2020, 3:30 PM  Recent Labs  Lab 11/08/20 1532 11/09/20 0222  WBC 3.3* 3.1*  HGB 9.4* 8.9*   Recent Labs  Lab 11/05/20 0117 11/08/20 1532 11/09/20 0222  K 3.4* 4.4 4.0  BUN 20 27* 35*  CREATININE 3.57* 5.89* 6.64*  CALCIUM 7.5* 8.0* 8.1*  PHOS 2.8  --   --

## 2020-11-09 NOTE — Progress Notes (Signed)
Mortada Cleo-patient's son is allowed to stay here overnight .

## 2020-11-09 NOTE — ED Notes (Signed)
Admit team at bedside. MD notified of nausea, vomiting episode. Pending order for nausea medication. MD also notified if pt is still in need of NPO status. MD states she will place order after evaluation if pt can eat. Will continue to monitor.

## 2020-11-09 NOTE — Progress Notes (Addendum)
HD#1 Subjective:   Admitted overnight.  During evaluation at bedside this morning, patient's son at bedside to interpret and provide additional history. Patient endorses nausea and recent episode of emesis. Onset after taking oral lactulose. Patient is alert and oriented to self, place, and time. He does not know why he came to the hospital. Patient's son reports the patient is back to his baseline mental status. Reiterates that patient had episode of nausea and emesis after discharge from hospital 4 days ago.   Objective:   Vital signs in last 24 hours: Vitals:   11/09/20 1207 11/09/20 1230 11/09/20 1237 11/09/20 1307  BP: (!) 171/67 (!) 126/57 (!) 127/57 (!) 97/49  Pulse: 86 86 86 94  Resp:  16    Temp:      TempSrc:      SpO2:      Weight:      Height:       Physical Exam General: Sitting up in bed in no acute distress, non-toxic appearing, non-diaphoretic, holding emesis bag CV: Regular rate, rhythm. 2/6 systolic murmur present. No lower extremity edema. Pulm: Normal work of breathing on room air. Clear to auscultation bilaterally. No wheezing, rhonchi, rales. Skin: AV graft L arm without drainage, erythema. RIJ HD tunneled catheter in place, no surrounding erythema.  Neuro: A&O x 3, cannot recall reason for coming to hospital. No asterixis.  Filed Weights   11/08/20 1453 11/09/20 1200  Weight: 63.5 kg 63.4 kg    Pertinent Labs: CBC Latest Ref Rng & Units 11/09/2020 11/08/2020 11/05/2020  WBC 4.0 - 10.5 K/uL 3.1(L) 3.3(L) 3.3(L)  Hemoglobin 13.0 - 17.0 g/dL 8.9(L) 9.4(L) 8.0(L)  Hematocrit 39.0 - 52.0 % 27.2(L) 29.2(L) 24.8(L)  Platelets 150 - 400 K/uL 48(L) 43(L) 48(L)    CMP Latest Ref Rng & Units 11/09/2020 11/08/2020 11/05/2020  Glucose 70 - 99 mg/dL 237(H) 244(H) 383(H)  BUN 8 - 23 mg/dL 35(H) 27(H) 20  Creatinine 0.61 - 1.24 mg/dL 6.64(H) 5.89(H) 3.57(H)  Sodium 135 - 145 mmol/L 136 137 127(L)  Potassium 3.5 - 5.1 mmol/L 4.0 4.4 3.4(L)  Chloride 98 - 111 mmol/L 98  98 95(L)  CO2 22 - 32 mmol/L 29 29 24   Calcium 8.9 - 10.3 mg/dL 8.1(L) 8.0(L) 7.5(L)  Total Protein 6.5 - 8.1 g/dL 5.8(L) 6.1(L) -  Total Bilirubin 0.3 - 1.2 mg/dL 2.1(H) 2.0(H) -  Alkaline Phos 38 - 126 U/L 127(H) 143(H) -  AST 15 - 41 U/L 30 37 -  ALT 0 - 44 U/L 9 5 -    Imaging: CT Head Wo Contrast  Result Date: 11/08/2020 CLINICAL DATA:  70 year old male with altered mental status. EXAM: CT HEAD WITHOUT CONTRAST TECHNIQUE: Contiguous axial images were obtained from the base of the skull through the vertex without intravenous contrast. COMPARISON:  Head CT dated 11/03/2020. FINDINGS: Brain: Mild age-related atrophy and chronic microvascular ischemic changes. There is no acute intracranial hemorrhage. No mass effect or midline shift no extra-axial fluid collection. Vascular: No hyperdense vessel or unexpected calcification. Skull: Normal. Negative for fracture or focal lesion. Sinuses/Orbits: Diffuse mucoperiosteal thickening of paranasal sinuses. No air-fluid. The mastoid air cells are clear. Other: None IMPRESSION: 1. No acute intracranial pathology. 2. Mild age-related atrophy and chronic microvascular ischemic changes. Electronically Signed   By: Anner Crete M.D.   On: 11/08/2020 16:10   Scheduled: . Chlorhexidine Gluconate Cloth  6 each Topical Q0600  . insulin aspart  0-15 Units Subcutaneous TID WC  . lactulose  30  g Oral TID  . rifaximin  550 mg Oral BID    Assessment/Plan:   Active Problems:   Hepatic encephalopathy [facility])   Patient Summary:  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   Acute metabolic encephalopathy Hepatic encephalopathy  Cirrhosis 2/2 NAFLD s/p TIPS, embolization of varices and gastrorenal shunt (04/2020) Patient back to his baseline mental status this morning after  lactulose enema. No asterixis on exam this morning. Patient afebrile, BP elevated, otherwise hemodynamically stable. Plan for HD today, symptomatic treatment of nausea (see below), and possible discharge home if patient's nausea has improved and he tolerates PO. As noted previously, patient is seen by [facility] for evaluation for transplant, most recent visit in March (next visit scheduled in September). Per chart review, consideration of downsizing TIPS if episodes of hepatic encephalopathy continue. If he is unable to tolerate more than one dose of lactulose daily, may need to follow-up sooner.  - Lactulose 30g TID, rifaximin 515m BID - possible discharge home today after dialysis pending improvement in nausea - Will need close follow-up with [facility]  Nausea and vomiting Patient with onset of nausea and vomiting following PO lactulose administration this morning. BUN/Cr 35/6.64 this morning. Per nephrology, patient's degree of nausea out of proportion to his kidney dysfunction. Nausea not necessarily typical for hepatic encephalopathy, however per chart review patient has had nausea during previous presentations for similar. QTc on admission 522. Will treat symptomatically with IV Zofran and reassess after HD. - IV Zofran 4 mg IV  ESRD on HD TTS Goodpasture syndrome Management per nephrology. HD today. BP mildly elevated, electrolytes stable.   Type 2 diabetes mellitus Last A1c 6.2 in March. He is insulin dependent and takes 14U degludec daily and 6-8U Novolin BID. Glucose 244 on arrival. Given he is NPO will hold off re-staring insulin. Will restart long-acting insulin at decreased dose if patient remains admitted. 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 Will continue to monitor.   Hypertension Patient currently not on antihypertensives. Previously on coreg but stopped in December 2/2 hypotension. BP mildly  elevated in ED. Expect to improve with HD today.       Please contact the on call pager after 5 pm and on weekends at [phone]  JAlexandria Lodge MD PGY-1 Internal Medicine Teaching Service Pager: [phone]/02/2021

## 2020-11-09 NOTE — ED Notes (Addendum)
Pt son refused insulin administration. Explained to pt son that even though he is NPO, sugar could still be high and MD ordered coverage. Pt son continues to refuse insulin. Taken to dialysis at this time by transporter.

## 2020-11-10 DIAGNOSIS — N186 End stage renal disease: Secondary | ICD-10-CM | POA: Diagnosis not present

## 2020-11-10 DIAGNOSIS — K7290 Hepatic failure, unspecified without coma: Secondary | ICD-10-CM | POA: Diagnosis not present

## 2020-11-10 DIAGNOSIS — E119 Type 2 diabetes mellitus without complications: Secondary | ICD-10-CM | POA: Diagnosis not present

## 2020-11-10 LAB — GLUCOSE, CAPILLARY: Glucose-Capillary: 152 mg/dL — ABNORMAL HIGH (ref 70–99)

## 2020-11-10 NOTE — Progress Notes (Signed)
[facility] Progress Note   Subjective:   Patient seen and examined at bedside.  Alert and oriented in bed, following commands.  Denies CP, SOB, n/v/d.  Son concerned about use of AVG yesterday during dialysis.  Used by mistake.  Good thrill/bruit throughout.  Removed bandage over cannulation sites, small pin point amount of fresh blood present, asked nurse to redress and advised patient son to leave dressing on until he goes to HD tomorrow and will have it reassessed.    Objective Vitals:   11/09/20 1537 11/09/20 1624 11/09/20 2039 11/10/20 0400  BP: (!) 129/55 118/89 (!) 141/55 130/60  Pulse: 82 92 86 79  Resp:  18 20 18   Temp: 98 F (36.7 C) 98.3 F (36.8 C) 98.7 F (37.1 C) 99.2 F (37.3 C)  TempSrc: Oral Oral Oral Oral  SpO2: 100% 100% 97% 97%  Weight: 61.5 kg   64.1 kg  Height:       Physical Exam General:thin, chronically ill appearing male in NAD Heart:RRR, no mrg Lungs:CTAB, nml WOB on RA Abdomen:soft, NTND Extremities: no LE edema Dialysis Access: LU AVG +b/t, TDC c/d/i   Filed Weights   11/09/20 1200 11/09/20 1537 11/10/20 0400  Weight: 63.4 kg 61.5 kg 64.1 kg    Intake/Output Summary (Last 24 hours) at 11/10/2020 1131 Last data filed at 11/10/2020 0850 Gross per 24 hour  Intake 660 ml  Output 596 ml  Net 64 ml    Additional Objective Labs: Basic Metabolic Panel: Recent Labs  Lab 11/05/20 0117 11/08/20 1532 11/09/20 0222  NA 127* 137 136  K 3.4* 4.4 4.0  CL 95* 98 98  CO2 24 29 29   GLUCOSE 383* 244* 237*  BUN 20 27* 35*  CREATININE 3.57* 5.89* 6.64*  CALCIUM 7.5* 8.0* 8.1*  PHOS 2.8  --   --    Liver Function Tests: Recent Labs  Lab 11/03/20 1403 11/05/20 0117 11/08/20 1532 11/09/20 0222  AST 35  --  37 30  ALT 9  --  5 9  ALKPHOS 130*  --  143* 127*  BILITOT 1.9*  --  2.0* 2.1*  PROT 6.2*  --  6.1* 5.8*  ALBUMIN 2.5* 1.8* 2.4* 2.2*   CBC: Recent Labs  Lab 11/03/20 1403 11/03/20 1445 11/04/20 0409 11/04/20 1648  11/05/20 0117 11/08/20 1532 11/09/20 0222  WBC 5.2  --  3.5* 5.2 3.3* 3.3* 3.1*  NEUTROABS 3.1  --   --   --   --  1.4*  --   HGB 10.4*   < > 8.3* 9.2* 8.0* 9.4* 8.9*  HCT 32.5*   < > 26.0* 28.4* 24.8* 29.2* 27.2*  MCV 84.2  --  86.7 83.5 84.6 84.4 84.7  PLT 54*  --  52* 57* 48* 43* 48*   < > = values in this interval not displayed.   CBG: Recent Labs  Lab 11/09/20 0749 11/09/20 1144 11/09/20 1724 11/09/20 2134 11/10/20 0643  GLUCAP 223* 259* 137* 146* 152*   Studies/Results: CT Head Wo Contrast  Result Date: 11/08/2020 CLINICAL DATA:  70 year old male with altered mental status. EXAM: CT HEAD WITHOUT CONTRAST TECHNIQUE: Contiguous axial images were obtained from the base of the skull through the vertex without intravenous contrast. COMPARISON:  Head CT dated 11/03/2020. FINDINGS: Brain: Mild age-related atrophy and chronic microvascular ischemic changes. There is no acute intracranial hemorrhage. No mass effect or midline shift no extra-axial fluid collection. Vascular: No hyperdense vessel or unexpected calcification. Skull: Normal. Negative for fracture or  focal lesion. Sinuses/Orbits: Diffuse mucoperiosteal thickening of paranasal sinuses. No air-fluid. The mastoid air cells are clear. Other: None IMPRESSION: 1. No acute intracranial pathology. 2. Mild age-related atrophy and chronic microvascular ischemic changes. Electronically Signed   By: Anner Crete M.D.   On: 11/08/2020 16:10    Medications:  . Chlorhexidine Gluconate Cloth  6 each Topical Q0600  . [START ON 11/14/2020] darbepoetin (ARANESP) injection - DIALYSIS  60 mcg Intravenous Q Tue-HD  . [START ON 11/11/2020] doxercalciferol  1 mcg Intravenous Q T,Th,Sa-HD  . insulin aspart  0-15 Units Subcutaneous TID WC  . insulin glargine  8 Units Subcutaneous QHS  . lactulose  30 g Oral TID  . rifaximin  550 mg Oral BID    Dialysis Orders:  4h  400/500  63.5kg  2/2 bath  TDC RIJ   No heparin d/t religious reasons / block  catheter w/ Na citrate  - hect 1 ug tiw  - mircera 60ug q2 last 3/29, due 4/12    Na 136  K 4  CO 29  BUN 35  Cr 6.64  Alb 2.2 ca 8.1  Tbili 2.2      WBC 3.1  Hb 8.9  plt 48k  Assessment/Plan: 1. AMS - Hx hepatic enceph - NH3 elevated on admit. Given lactulose. Now at baseline.  2. N/v - resolved. Recurrent issues. Per primary 3. ESRD - on HD TTS.  Plan for HD tomorrow per regular schedule at outpatient clinic. 4. Recent AVF revision to AVG - used yesterday by mistake.  Minimal blood present when bandage removed, redressed.  Advised son to leave bandage on, will have it removed at HD tomorrow and reassess.  5. Thrombocytopenia - chronic. No acute bleeding.  Not on Heparin or blood thinner.  3. Anemia of CKD- Hgb 8.9. ESA due next week.  4. Secondary hyperparathyroidism - Corrected Ca in goal.  Continue VDRA. Phos a little low, may need to decrease binders if drops any further. Follow up labs as OP. 5. HTN/volume - Blood pressure at goal.  Does not appear volume overloaded on exam. Per weights close to EDW.  6. Nutrition - Renal diet w/fluid restrictions.    Jen Mow, PA-C [facility] 11/10/2020,11:31 AM  LOS: 2 days

## 2020-11-10 NOTE — Discharge Instructions (Signed)
William [name],  It was a pleasure taking care of you in the hospital. You were admitted for confusion which was likely from your liver disease (hepatic encephalopathy). Very likely the antibiotics you received during your last admission caused GI symptoms which then made you unable to tolerate your lactulose. The nausea you experienced during this hospitalization was most likely from taking the lactulose on an empty stomach. Continue to take your medications as directed.   Please follow-up with your primary care doctor in the next week.   Take care!

## 2020-11-13 ENCOUNTER — Inpatient Hospital Stay (HOSPITAL_COMMUNITY)
Admission: EM | Admit: 2020-11-13 | Discharge: 2020-11-15 | DRG: 864 | Disposition: A | Discharge status: Home Health | Payer: Medicare Other | Attending: Internal Medicine | Admitting: Internal Medicine

## 2020-11-13 ENCOUNTER — Encounter (HOSPITAL_COMMUNITY): Payer: Self-pay | Admitting: Internal Medicine

## 2020-11-13 ENCOUNTER — Emergency Department (HOSPITAL_COMMUNITY): Payer: Medicare Other

## 2020-11-13 ENCOUNTER — Other Ambulatory Visit: Payer: Self-pay

## 2020-11-13 DIAGNOSIS — J9811 Atelectasis: Secondary | ICD-10-CM | POA: Diagnosis not present

## 2020-11-13 DIAGNOSIS — D696 Thrombocytopenia, unspecified: Secondary | ICD-10-CM | POA: Diagnosis not present

## 2020-11-13 DIAGNOSIS — M6281 Muscle weakness (generalized): Secondary | ICD-10-CM | POA: Insufficient documentation

## 2020-11-13 DIAGNOSIS — W19XXXA Unspecified fall, initial encounter: Secondary | ICD-10-CM | POA: Diagnosis present

## 2020-11-13 DIAGNOSIS — Z961 Presence of intraocular lens: Secondary | ICD-10-CM | POA: Diagnosis present

## 2020-11-13 DIAGNOSIS — K7290 Hepatic failure, unspecified without coma: Secondary | ICD-10-CM | POA: Diagnosis present

## 2020-11-13 DIAGNOSIS — Z833 Family history of diabetes mellitus: Secondary | ICD-10-CM | POA: Diagnosis not present

## 2020-11-13 DIAGNOSIS — K529 Noninfective gastroenteritis and colitis, unspecified: Secondary | ICD-10-CM | POA: Diagnosis not present

## 2020-11-13 DIAGNOSIS — M310 Hypersensitivity angiitis: Secondary | ICD-10-CM | POA: Diagnosis present

## 2020-11-13 DIAGNOSIS — Z9841 Cataract extraction status, right eye: Secondary | ICD-10-CM

## 2020-11-13 DIAGNOSIS — D61818 Other pancytopenia: Secondary | ICD-10-CM | POA: Diagnosis not present

## 2020-11-13 DIAGNOSIS — R296 Repeated falls: Secondary | ICD-10-CM | POA: Diagnosis not present

## 2020-11-13 DIAGNOSIS — Y92009 Unspecified place in unspecified non-institutional (private) residence as the place of occurrence of the external cause: Secondary | ICD-10-CM

## 2020-11-13 DIAGNOSIS — R7989 Other specified abnormal findings of blood chemistry: Secondary | ICD-10-CM

## 2020-11-13 DIAGNOSIS — R531 Weakness: Secondary | ICD-10-CM | POA: Diagnosis present

## 2020-11-13 DIAGNOSIS — Z823 Family history of stroke: Secondary | ICD-10-CM

## 2020-11-13 DIAGNOSIS — K760 Fatty (change of) liver, not elsewhere classified: Secondary | ICD-10-CM | POA: Diagnosis present

## 2020-11-13 DIAGNOSIS — D649 Anemia, unspecified: Secondary | ICD-10-CM | POA: Diagnosis not present

## 2020-11-13 DIAGNOSIS — S0003XA Contusion of scalp, initial encounter: Secondary | ICD-10-CM | POA: Diagnosis not present

## 2020-11-13 DIAGNOSIS — R509 Fever, unspecified: Principal | ICD-10-CM | POA: Diagnosis present

## 2020-11-13 DIAGNOSIS — E1122 Type 2 diabetes mellitus with diabetic chronic kidney disease: Secondary | ICD-10-CM | POA: Diagnosis present

## 2020-11-13 DIAGNOSIS — D631 Anemia in chronic kidney disease: Secondary | ICD-10-CM | POA: Diagnosis not present

## 2020-11-13 DIAGNOSIS — R4182 Altered mental status, unspecified: Secondary | ICD-10-CM

## 2020-11-13 DIAGNOSIS — N186 End stage renal disease: Secondary | ICD-10-CM | POA: Diagnosis not present

## 2020-11-13 DIAGNOSIS — H5440 Blindness, one eye, unspecified eye: Secondary | ICD-10-CM | POA: Diagnosis present

## 2020-11-13 DIAGNOSIS — I5032 Chronic diastolic (congestive) heart failure: Secondary | ICD-10-CM | POA: Diagnosis not present

## 2020-11-13 DIAGNOSIS — E8809 Other disorders of plasma-protein metabolism, not elsewhere classified: Secondary | ICD-10-CM | POA: Diagnosis present

## 2020-11-13 DIAGNOSIS — Z992 Dependence on renal dialysis: Secondary | ICD-10-CM

## 2020-11-13 DIAGNOSIS — I700 Atherosclerosis of aorta: Secondary | ICD-10-CM | POA: Diagnosis not present

## 2020-11-13 DIAGNOSIS — R6521 Severe sepsis with septic shock: Secondary | ICD-10-CM | POA: Diagnosis not present

## 2020-11-13 DIAGNOSIS — N2581 Secondary hyperparathyroidism of renal origin: Secondary | ICD-10-CM | POA: Diagnosis not present

## 2020-11-13 DIAGNOSIS — Z79899 Other long term (current) drug therapy: Secondary | ICD-10-CM

## 2020-11-13 DIAGNOSIS — Z8249 Family history of ischemic heart disease and other diseases of the circulatory system: Secondary | ICD-10-CM | POA: Diagnosis not present

## 2020-11-13 DIAGNOSIS — I5030 Unspecified diastolic (congestive) heart failure: Secondary | ICD-10-CM | POA: Diagnosis not present

## 2020-11-13 DIAGNOSIS — K7460 Unspecified cirrhosis of liver: Secondary | ICD-10-CM | POA: Diagnosis not present

## 2020-11-13 DIAGNOSIS — Z89422 Acquired absence of other left toe(s): Secondary | ICD-10-CM

## 2020-11-13 DIAGNOSIS — R000 Tachycardia, unspecified: Secondary | ICD-10-CM | POA: Diagnosis not present

## 2020-11-13 DIAGNOSIS — S0990XA Unspecified injury of head, initial encounter: Secondary | ICD-10-CM | POA: Diagnosis not present

## 2020-11-13 DIAGNOSIS — Z9842 Cataract extraction status, left eye: Secondary | ICD-10-CM

## 2020-11-13 DIAGNOSIS — I132 Hypertensive heart and chronic kidney disease with heart failure and with stage 5 chronic kidney disease, or end stage renal disease: Secondary | ICD-10-CM | POA: Diagnosis present

## 2020-11-13 DIAGNOSIS — E1129 Type 2 diabetes mellitus with other diabetic kidney complication: Secondary | ICD-10-CM | POA: Diagnosis not present

## 2020-11-13 DIAGNOSIS — E7229 Other disorders of urea cycle metabolism: Secondary | ICD-10-CM | POA: Diagnosis not present

## 2020-11-13 DIAGNOSIS — Z794 Long term (current) use of insulin: Secondary | ICD-10-CM | POA: Diagnosis not present

## 2020-11-13 DIAGNOSIS — I10 Essential (primary) hypertension: Secondary | ICD-10-CM | POA: Diagnosis not present

## 2020-11-13 DIAGNOSIS — E8889 Other specified metabolic disorders: Secondary | ICD-10-CM | POA: Diagnosis present

## 2020-11-13 DIAGNOSIS — Z20822 Contact with and (suspected) exposure to covid-19: Secondary | ICD-10-CM | POA: Diagnosis not present

## 2020-11-13 DIAGNOSIS — N250 Renal osteodystrophy: Secondary | ICD-10-CM | POA: Diagnosis not present

## 2020-11-13 LAB — COMPREHENSIVE METABOLIC PANEL
ALT: 13 U/L (ref 0–44)
ALT: 12 U/L (ref 0–44)
AST: 41 U/L (ref 15–41)
AST: 36 U/L (ref 15–41)
Albumin: 2.3 g/dL — ABNORMAL LOW (ref 3.5–5.0)
Albumin: 2.1 g/dL — ABNORMAL LOW (ref 3.5–5.0)
Alkaline Phosphatase: 122 U/L (ref 38–126)
Alkaline Phosphatase: 112 U/L (ref 38–126)
Anion gap: 10 (ref 5–15)
Anion gap: 11 (ref 5–15)
BUN: 35 mg/dL — ABNORMAL HIGH (ref 8–23)
BUN: 36 mg/dL — ABNORMAL HIGH (ref 8–23)
CO2: 27 mmol/L (ref 22–32)
CO2: 27 mmol/L (ref 22–32)
Calcium: 8 mg/dL — ABNORMAL LOW (ref 8.9–10.3)
Calcium: 7.8 mg/dL — ABNORMAL LOW (ref 8.9–10.3)
Chloride: 97 mmol/L — ABNORMAL LOW (ref 98–111)
Chloride: 96 mmol/L — ABNORMAL LOW (ref 98–111)
Creatinine, Ser: 6.77 mg/dL — ABNORMAL HIGH (ref 0.61–1.24)
Creatinine, Ser: 7.1 mg/dL — ABNORMAL HIGH (ref 0.61–1.24)
GFR, Estimated: 8 mL/min — ABNORMAL LOW (ref >60)
GFR, Estimated: 8 mL/min — ABNORMAL LOW (ref >60)
Glucose, Bld: 132 mg/dL — ABNORMAL HIGH (ref 70–99)
Glucose, Bld: 106 mg/dL — ABNORMAL HIGH (ref 70–99)
Potassium: 3.9 mmol/L (ref 3.5–5.1)
Potassium: 3.8 mmol/L (ref 3.5–5.1)
Sodium: 134 mmol/L — ABNORMAL LOW (ref 135–145)
Sodium: 134 mmol/L — ABNORMAL LOW (ref 135–145)
Total Bilirubin: 1.8 mg/dL — ABNORMAL HIGH (ref 0.3–1.2)
Total Bilirubin: 1.6 mg/dL — ABNORMAL HIGH (ref 0.3–1.2)
Total Protein: 6 g/dL — ABNORMAL LOW (ref 6.5–8.1)
Total Protein: 5.4 g/dL — ABNORMAL LOW (ref 6.5–8.1)

## 2020-11-13 LAB — CBC WITH DIFFERENTIAL/PLATELET
Abs Immature Granulocytes: 0.01 10*3/uL (ref 0.00–0.07)
Basophils Absolute: 0 10*3/uL (ref 0.0–0.1)
Basophils Relative: 0 %
Eosinophils Absolute: 0.1 10*3/uL (ref 0.0–0.5)
Eosinophils Relative: 2 %
HCT: 24.7 % — ABNORMAL LOW (ref 39.0–52.0)
Hemoglobin: 8.1 g/dL — ABNORMAL LOW (ref 13.0–17.0)
Immature Granulocytes: 0 %
Lymphocytes Relative: 22 %
Lymphs Abs: 0.7 10*3/uL (ref 0.7–4.0)
MCH: 27.5 pg (ref 26.0–34.0)
MCHC: 32.8 g/dL (ref 30.0–36.0)
MCV: 83.7 fL (ref 80.0–100.0)
Monocytes Absolute: 0.6 10*3/uL (ref 0.1–1.0)
Monocytes Relative: 19 %
Neutro Abs: 1.8 10*3/uL (ref 1.7–7.7)
Neutrophils Relative %: 57 %
Platelets: 50 10*3/uL — ABNORMAL LOW (ref 150–400)
RBC: 2.95 MIL/uL — ABNORMAL LOW (ref 4.22–5.81)
RDW: 14.9 % (ref 11.5–15.5)
WBC: 3.2 10*3/uL — ABNORMAL LOW (ref 4.0–10.5)
nRBC: 0 % (ref 0.0–0.2)

## 2020-11-13 LAB — GLUCOSE, CAPILLARY
Glucose-Capillary: 108 mg/dL — ABNORMAL HIGH (ref 70–99)
Glucose-Capillary: 209 mg/dL — ABNORMAL HIGH (ref 70–99)
Glucose-Capillary: 272 mg/dL — ABNORMAL HIGH (ref 70–99)
Glucose-Capillary: 196 mg/dL — ABNORMAL HIGH (ref 70–99)
Glucose-Capillary: 183 mg/dL — ABNORMAL HIGH (ref 70–99)

## 2020-11-13 LAB — RESP PANEL BY RT-PCR (FLU A&B, COVID) ARPGX2
Influenza A by PCR: NEGATIVE
Influenza B by PCR: NEGATIVE
SARS Coronavirus 2 by RT PCR: NEGATIVE

## 2020-11-13 LAB — CBC
HCT: 22.6 % — ABNORMAL LOW (ref 39.0–52.0)
Hemoglobin: 7.3 g/dL — ABNORMAL LOW (ref 13.0–17.0)
MCH: 26.8 pg (ref 26.0–34.0)
MCHC: 32.3 g/dL (ref 30.0–36.0)
MCV: 83.1 fL (ref 80.0–100.0)
Platelets: 50 10*3/uL — ABNORMAL LOW (ref 150–400)
RBC: 2.72 MIL/uL — ABNORMAL LOW (ref 4.22–5.81)
RDW: 14.9 % (ref 11.5–15.5)
WBC: 3.1 10*3/uL — ABNORMAL LOW (ref 4.0–10.5)
nRBC: 0 % (ref 0.0–0.2)

## 2020-11-13 LAB — PROTIME-INR
INR: 1.3 — ABNORMAL HIGH (ref 0.8–1.2)
Prothrombin Time: 15.8 seconds — ABNORMAL HIGH (ref 11.4–15.2)

## 2020-11-13 LAB — AMMONIA: Ammonia: 65 umol/L — ABNORMAL HIGH (ref 9–35)

## 2020-11-13 LAB — C DIFFICILE QUICK SCREEN W PCR REFLEX
C Diff antigen: NEGATIVE
C Diff interpretation: NOT DETECTED
C Diff toxin: NEGATIVE

## 2020-11-13 LAB — LACTIC ACID, PLASMA: Lactic Acid, Venous: 1.9 mmol/L (ref 0.5–1.9)

## 2020-11-13 MED ORDER — DARBEPOETIN ALFA 100 MCG/0.5ML IJ SOSY
100.0000 ug | PREFILLED_SYRINGE | INTRAMUSCULAR | Status: DC
  Administered 2020-11-14: 100 ug via INTRAVENOUS
  Filled 2020-11-13: qty 0.5

## 2020-11-13 MED ORDER — RIFAXIMIN 550 MG PO TABS
550.0000 mg | ORAL_TABLET | Freq: Two times a day (BID) | ORAL | Status: DC
  Administered 2020-11-13 – 2020-11-15 (×5): 550 mg via ORAL
  Filled 2020-11-13 (×5): qty 1

## 2020-11-13 MED ORDER — INSULIN ASPART 100 UNIT/ML ~~LOC~~ SOLN
0.0000 [IU] | Freq: Three times a day (TID) | SUBCUTANEOUS | Status: DC
  Administered 2020-11-13: 3 [IU] via SUBCUTANEOUS
  Administered 2020-11-13: 1 [IU] via SUBCUTANEOUS
  Administered 2020-11-14: 3 [IU] via SUBCUTANEOUS
  Administered 2020-11-15: 2 [IU] via SUBCUTANEOUS

## 2020-11-13 MED ORDER — PROSOURCE PLUS PO LIQD
30.0000 mL | Freq: Two times a day (BID) | ORAL | Status: DC
  Administered 2020-11-13 (×2): 30 mL via ORAL
  Filled 2020-11-13 (×3): qty 30

## 2020-11-13 MED ORDER — ONDANSETRON HCL 4 MG/2ML IJ SOLN: 4.0000 mg | Freq: Three times a day (TID) | INTRAMUSCULAR | Status: DC | PRN

## 2020-11-13 MED ORDER — ROSUVASTATIN CALCIUM 5 MG PO TABS
5.0000 mg | ORAL_TABLET | Freq: Every day | ORAL | Status: DC
  Administered 2020-11-13 – 2020-11-15 (×3): 5 mg via ORAL
  Filled 2020-11-13 (×3): qty 1

## 2020-11-13 MED ORDER — ACETAMINOPHEN 650 MG RE SUPP: 650.0000 mg | Freq: Four times a day (QID) | RECTAL | Status: DC | PRN

## 2020-11-13 MED ORDER — INSULIN ASPART 100 UNIT/ML ~~LOC~~ SOLN
0.0000 [IU] | Freq: Three times a day (TID) | SUBCUTANEOUS | Status: DC
  Administered 2020-11-13: 5 [IU] via SUBCUTANEOUS

## 2020-11-13 MED ORDER — ACETAMINOPHEN 325 MG PO TABS
650.0000 mg | ORAL_TABLET | Freq: Four times a day (QID) | ORAL | Status: DC | PRN
Start: 2020-11-13 — End: 2020-11-15
  Administered 2020-11-13 – 2020-11-15 (×2): 650 mg via ORAL
  Filled 2020-11-13 (×2): qty 2

## 2020-11-13 MED ORDER — LACTULOSE 10 GM/15ML PO SOLN
30.0000 g | Freq: Three times a day (TID) | ORAL | Status: DC
  Administered 2020-11-13 – 2020-11-15 (×2): 30 g via ORAL
  Filled 2020-11-13 (×6): qty 45

## 2020-11-13 MED ORDER — INSULIN DETEMIR 100 UNIT/ML ~~LOC~~ SOLN
7.0000 [IU] | Freq: Every day | SUBCUTANEOUS | Status: DC
  Administered 2020-11-13: 7 [IU] via SUBCUTANEOUS
  Filled 2020-11-13 (×2): qty 0.07

## 2020-11-13 NOTE — Progress Notes (Addendum)
HD#0 Subjective:   Admitted early this morning.  Patient evaluated with son at bedside serving as interpretor and providing additional history.  Patient reports he has not had any episodes of diarrhea since the 6 he had yesterday. Reports he had chills previously as well but has not had any since before coming to the hospital. Reiterates that his fall was not preceded by dizziness or lightheadedness but rather occurred secondary to generalized weakness. Denies abdominal pain, nausea or vomiting, pain at his revised AVG site or RIJ HD tunneled catheter, congestion, rhinorrhea, chest pain, shortness of breath, dysuria, sick contacts. Reports RIF HD tunneled catheter was accessed during dialysis on Saturday.   Objective:   Vital signs in last 24 hours: Vitals:   11/13/20 0337 11/13/20 0625 11/13/20 0705 11/13/20 1031  BP: (!) 114/45 139/64  (!) 122/55  Pulse: 81 77  73  Resp: 13 18  18   Temp:  98.4 F (36.9 C)  98.3 F (36.8 C)  TempSrc:  Oral    SpO2: 98% 100%  100%  Weight:   63.3 kg   Height:       Physical Exam Constitutional: chronically-ill appearing man lying in bed, in no acute distress HENT: normocephalic, minimal soft tissue swelling on L temple, no breakage of skin, mucous membranes moist Eyes: scleral icterus present Neck: supple, no JVD Cardiovascular: regular rate and rhythm, 2/6 systolic murmur best heard at RUSB. DP pulses 2+ bilaterally. No lower extremity edema. Pulmonary/Chest: normal work of breathing on room air, lungs clear to auscultation bilaterally Abdominal: soft, non-tender, non-distended MSK: Extremities are thin. L third toe amputated. Neurological: alert & oriented x 3 Skin: bilateral feet warm and dry; site of L third toe amputation with granulation tissue present without drainage, erythema, or tenderness to palpation of the area; otherwise multiple areas of callous, including large callous on the superior aspect of R third toe which is tender to  palpation. Dry, healing fissure on dorsum of foot between 3rd and 4th toes.   Pertinent Labs: CBC Latest Ref Rng & Units 11/13/2020 11/13/2020 11/09/2020  WBC 4.0 - 10.5 K/uL 3.1(L) 3.2(L) 3.1(L)  Hemoglobin 13.0 - 17.0 g/dL 7.3(L) 8.1(L) 8.9(L)  Hematocrit 39.0 - 52.0 % 22.6(L) 24.7(L) 27.2(L)  Platelets 150 - 400 K/uL 50(L) 50(L) 48(L)    CMP Latest Ref Rng & Units 11/13/2020 11/13/2020 11/09/2020  Glucose 70 - 99 mg/dL 106(H) 132(H) 237(H)  BUN 8 - 23 mg/dL 36(H) 35(H) 35(H)  Creatinine 0.61 - 1.24 mg/dL 7.10(H) 6.77(H) 6.64(H)  Sodium 135 - 145 mmol/L 134(L) 134(L) 136  Potassium 3.5 - 5.1 mmol/L 3.8 3.9 4.0  Chloride 98 - 111 mmol/L 96(L) 97(L) 98  CO2 22 - 32 mmol/L 27 27 29   Calcium 8.9 - 10.3 mg/dL 7.8(L) 8.0(L) 8.1(L)  Total Protein 6.5 - 8.1 g/dL 5.4(L) 6.0(L) 5.8(L)  Total Bilirubin 0.3 - 1.2 mg/dL 1.6(H) 1.8(H) 2.1(H)  Alkaline Phos 38 - 126 U/L 112 122 127(H)  AST 15 - 41 U/L 36 41 30  ALT 0 - 44 U/L 12 13 9     Imaging: CT Head Wo Contrast  Result Date: 11/13/2020 CLINICAL DATA:  Sepsis, head injury EXAM: CT HEAD WITHOUT CONTRAST TECHNIQUE: Contiguous axial images were obtained from the base of the skull through the vertex without intravenous contrast. COMPARISON:  07/04/2020, 11/08/2020 FINDINGS: Brain: Normal anatomic configuration. Parenchymal volume loss is commensurate with the patient's age. Mild periventricular white matter changes are present likely reflecting the sequela of small vessel ischemia. Remote  tiny pontine infarct again noted. No abnormal intra or extra-axial mass lesion or fluid collection. No abnormal mass effect or midline shift. No evidence of acute intracranial hemorrhage or infarct. Ventricular size is normal. Cerebellum unremarkable. Vascular: No asymmetric hyperdense vasculature at the skull base. Skull: Intact Sinuses/Orbits: Paranasal sinuses are clear. Orbits are unremarkable. Other: Mastoid air cells and middle ear cavities are clear. IMPRESSION: No  acute intracranial hemorrhage or infarct. Stable senescent changes. Electronically Signed   By: Fidela Salisbury MD   On: 11/13/2020 01:57   DG Chest Port 1 View  Result Date: 11/13/2020 CLINICAL DATA:  Weakness and falls. EXAM: PORTABLE CHEST 1 VIEW COMPARISON:  November 03, 2020 FINDINGS: There is stable right-sided venous catheter positioning. Diffuse, chronic appearing increased interstitial lung markings are noted. Mild linear atelectasis is noted within the right lung base. There is no evidence of a pleural effusion or pneumothorax. The cardiac silhouette is mildly enlarged. There is mild to moderate severity calcification of the aortic arch. Radiopaque surgical coils are seen overlying the medial aspect of the left upper quadrant. The visualized skeletal structures are unremarkable. IMPRESSION: 1. Chronic appearing increased interstitial lung markings with mild right basilar linear atelectasis. Electronically Signed   By: Virgina Norfolk M.D.   On: 11/13/2020 01:44   Assessment/Plan:   Principal Problem:   FUO (fever of unknown origin) Active Problems:   Type II diabetes mellitus with renal manifestations (HCC)   ESRD on dialysis [facility])   Patient Summary:  William [name] is a 70 y.o. man with ESRD 2/2 Goodpasture syndrome on TTS HD, T2DM, HFpEF, cirrhosis 2/2 NAFLD s/p TIPS (04/2020), HTN admitted 4/11 with generalized weakness, diarrhea, and fever of unknown origin in setting of chronic leukopenia and multiple chronic disease states.  Fever, unknown source Diarrhea Fall, generalized weakness Patient evaluated by day team a few hours after his admission by night team. Patient had several episodes of diarrhea (5-6/day) on Saturday and Sunday but none so far today. Notably he has had more episodes of diarrhea these last couple of days than he had previously, all while taking the same daily dose of lactulose. He denies new symptoms since admission. Given his recent antibiotic exposure and  hospitalizations, will test for C difficile. If symptoms resolve, would attribute his GI symptoms to possible viral gastroenteritis. Patient also has numerous callouses on his feet, most notably a painful callous on the superior aspect of his R third toe. He follows with podiatry, most recently 11/06/20 for left third toe amputation post-op check at which time he was instructed to return as needed. As noted previously, will hold off antibiotic at this time given no clear source. Generalized weakness possibly 2/2 dialysis vs chronic state. Negative orthostatics. Likely will benefit from physical therapy.  - Follow-up c difficile testing - F/u UA, UCx, BCx - PT/OT - Daily CBC, BMP  Cirrhosis 2/2 NAFLD s/p TIPS, embolization of varices, gastrorenal shunt (04/2020) Patient mentating well so far this admission. CMP at baseline, TBili 1.8 on admission. - Lactulose 30g TID, rifaximin 542m qd - F/u CMP - [facility]TS Goodpasture syndrome Nephrology consulted for inpatient hemodialysis. [facility]  - Nephrology consulted, appreciate recs - Renal diet - Strict I&O's  Type 2 diabetes mellitus A1c 6.2% in March. He is insulin dependent and takes 14U degludec daily and 6-8U Novolin BID. Glucose 132 on arrival. Will restart long-acting insulin at decreased dose. - CBG QID -  Levemir 7 units QHS - SSI - very sensitive  HFpEF Appears euvolemic on exam. As part of evaluation for transplant, recently underwent pharmacologic stress test. Revealed normal myocardial perfusion and normal LV function, not on any current medications.  Hypertension Patient not on antihypertensives, previously developed hypotension with medications. Controlled with HD.   Diet: Renal IVF: None VTE: SCDs Code: Full PT/OT recs: Pending TOC recs: Pending   Please contact the on call pager after 5 pm and on weekends at  [phone].  Alexandria Lodge, MD PGY-1 Internal Medicine Teaching Service Pager: [phone] 11/13/2020

## 2020-11-13 NOTE — Progress Notes (Signed)
OT Cancellation Note  Patient Details Name: William [name] MRN: [id number] DOB: [birth year]   Cancelled Treatment:    Reason Eval/Treat Not Completed: Patient at procedure or test/ unavailable;Other (comment) NT in to begin EKG. Will re-attempt OT eval as pt available.   Layla Maw 11/13/2020, 9:46 AM

## 2020-11-13 NOTE — ED Notes (Signed)
Attempted to call report to RN receiving pt. Secretary states that they have not looked up the pt yet and for me to call back.

## 2020-11-13 NOTE — Evaluation (Signed)
 Occupational Therapy Evaluation/Discharge Patient Details Name: William [name] MRN: [id number] DOB: [birth year] Today's Date: 11/13/2020    History of Present Illness William [name] is 70 y/o male who reported to ED following a fall and complaints of weakness, diarrhea, and fever on 11/13/20. CT negative for acute findings. PMH includes ESRD on HD, Goodpasture syndrome, CHF, cirrohsis, HTN, DM, duodenal mass and recent L toe amputation.   Clinical Impression   PTA, pt lives with family, ambulatory and able to complete ADLs without assist. Pt presents now with diagnoses above and able to demonstrate bed mobility and ambulation to/from bathroom using RW without assistance. Pt Modified Independent for toileting task. Pt/son verbalize understanding of WB precautions and use of post op shoe that family brought from home. No overt LOB or safety concerns noted. No further skilled OT services needed at acute level or on discharge. OT to sign off.    Follow Up Recommendations  No OT follow up    Equipment Recommendations  None recommended by OT    Recommendations for Other Services       Precautions / Restrictions Precautions Precautions: Fall Precaution Comments: L toe amputatiuon, partially blind in R eye Restrictions Weight Bearing Restrictions: Yes LLE Weight Bearing: Weight bearing as tolerated Other Position/Activity Restrictions: WBAT with darco shoe      Mobility Bed Mobility Overal bed mobility: Modified Independent Bed Mobility: Supine to Sit     Supine to sit: Modified independent (Device/Increase time)          Transfers Overall transfer level: Modified independent Equipment used: Rolling walker (2 wheeled) Transfers: Sit to/from [facility] Sit to Stand: Modified independent (Device/Increase time) Stand pivot transfers: Modified independent (Device/Increase time)       General transfer comment: min guard for safety, poor handplacement    Balance  Overall balance assessment: Needs assistance   Sitting balance-Leahy Scale: Good     Standing balance support: Bilateral upper extremity supported;Single extremity supported;During functional activity Standing balance-Leahy Scale: Fair Standing balance comment: Benefits from RW to help maintain precautions without darco shoe                           ADL either performed or assessed with clinical judgement   ADL Overall ADL's : Modified independent                                       General ADL Comments: able to ambulate to/from bathroom using RW without LOB or safety concerns. able to transfer on/off low toilet. able to assist in donning post op shoe     Vision Baseline Vision/History: Legally blind Patient Visual Report: No change from baseline Vision Assessment?: Vision impaired- to be further tested in functional context Additional Comments: R eye blindness     Perception     Praxis      Pertinent Vitals/Pain Pain Assessment: No/denies pain Faces Pain Scale: Hurts a little bit Pain Location: abdominal area Pain Descriptors / Indicators: Discomfort;Aching Pain Intervention(s): Repositioned;Monitored during session     Hand Dominance Right   Extremity/Trunk Assessment Upper Extremity Assessment Upper Extremity Assessment: Overall WFL for tasks assessed   Lower Extremity Assessment Lower Extremity Assessment: Defer to PT evaluation   Cervical / Trunk Assessment Cervical / Trunk Assessment: Normal   Communication Communication Communication: Prefers language other than [place];Other (comment) (son served as Astronomer)   Associate Professor  Arousal/Alertness: Awake/alert Behavior During Therapy: WFL for tasks assessed/performed Overall Cognitive Status: Within Functional Limits for tasks assessed                                 General Comments: Son present and interpreting. Difficult to fully assess due to language barrier,  but no concerns from son. Did seem a little confused, when asked to walk around the bed, started walking backwards   General Comments  Son present and served as interpreter. Per son, staff want stool sample - coordinated with NT/RN for collection of stool after toileting task    Exercises     Shoulder Instructions      Home Living Family/patient expects to be discharged to:: Private residence Living Arrangements: Children Available Help at Discharge: Family Type of Home: House Home Access: Stairs to enter Technical  of Steps: 3 Entrance Stairs-Rails: Right;Left;Can reach both Home Layout: Two level;1/2 bath on main level;Able to live on main level with bedroom/bathroom Alternate Level Stairs-Number of Steps: flight Alternate Level Stairs-Rails: Right;Left Bathroom Shower/Tub: Teacher, early years/pre: Standard     Home Equipment: Environmental consultant - 2 wheels;Cane - single point          Prior Functioning/Environment Level of Independence: Independent        Comments: able to complete ADLs, mobility        OT Problem List:        OT Treatment/Interventions:      OT Goals([name] goals can be found in the care plan section) Acute Rehab OT Goals Patient Stated Goal: stomach feel better OT Goal Formulation: All assessment and education complete, DC therapy  OT Frequency:     Barriers to D/C:            Co-evaluation              AM-PAC OT "6 Clicks" Daily Activity     Outcome Measure Help from another person eating meals?: None Help from another person taking care of personal grooming?: None Help from another person toileting, which includes using toliet, bedpan, or urinal?: None Help from another person bathing (including washing, rinsing, drying)?: None Help from another person to put on and taking off regular upper body clothing?: None Help from another person to put on and taking off regular lower body clothing?: None 6 Click Score: 24    End of Session Equipment Utilized During Treatment: Rolling walker Nurse Communication: Mobility status;Other (comment) (stool sample)  Activity Tolerance: Patient tolerated treatment well Patient left: in bed;with call bell/phone within reach;with family/visitor present  OT Visit Diagnosis: Other abnormalities of gait and mobility (R26.89)                Time: 9509-3267 OT Time Calculation (min): 13 min Charges:  OT General Charges $OT Visit: 1 Visit OT Evaluation $OT Eval Low Complexity: 1 Low  Malachy Chamber, OTR/L Acute Rehab Services Office: [phone]  Layla Maw 11/13/2020, 1:19 PM

## 2020-11-13 NOTE — ED Triage Notes (Signed)
Patient to ED via EMS from home for complaints of weakness, frequent falls, and diarrhea. Per EMS patient had temp of 103 at home, 1000 mg of tylenol administered PTA. Patient also has strong scent of urine on clothes however denies incontinence. Patient has ESRD, gets dialysis Tues/Thurs/Sat, last session on Saturday went well. Denies any other complaints.

## 2020-11-13 NOTE — Consult Note (Signed)
[facility] Renal Consultation Note    Indication for Consultation:  Management of ESRD/hemodialysis, anemia, hypertension/volume, and secondary hyperparathyroidism. PCP:  HPI: William [name] is a 70 y.o. 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  Pt unable to provide full Hx. Just recently discharged on 11/10/20 after overnight admit with hepatic encephalopathy which improved with dialysis and restarting lactulose. Presented back to ED early this morning with weakness, fever to 103F, and diarrhea and falling twice at home. Intake vitals showed mild HTN, no hypoxic, but febrile to 100.49F. Labs showed Na 134, K 3.8, Ca 7.8, WBC 3.1, Hgb 7.3, Plts 50. COVID/Flu negative. Head CT and CXR without acute changes. Ammonia 65. Blood Cx drawn but he was not started on antibiotics yet.  Seen in hospital room, he appears sleepy and non-conversational, but denies CP or dyspnea and looks otherwise comfortable.  Dialyzes on TTS schedule at [facility] clinic. Had a full treatment on Saturday 4/9 without issues.  Past Medical History:  Diagnosis Date  . Anemia   . Blind one eye   . CHF (congestive heart failure) ([place])   . Cirrhosis ([place])   . ESRD on hemodialysis ([place])   . Goodpasture's disease [facility])    on outpatient plasmapheresis/notes 11/20/2017  . Grade I diastolic dysfunction 96/28/3662  . History of anemia due to chronic kidney disease   . History of blood transfusion X 1   "UGIB; low blood count"  . History of plasmapheresis    "qod" (11/20/2017)  . Hypertension   . Pancytopenia ([place]) 08/20/2017  . Type II diabetes mellitus ([place])    Past Surgical History:  Procedure Laterality Date  . A/V FISTULAGRAM Left 10/16/2020   Procedure: A/V FISTULAGRAM;  Surgeon: Waynetta Sandy, MD;  Location: [place] [facility];  Service: Cardiovascular;   Laterality: Left;  . AMPUTATION TOE Left 10/07/2020   Procedure: AMPUTATION THIRD TOE;  Surgeon: Criselda Peaches, DPM;  Location: [place];  Service: Podiatry;  Laterality: Left;  . AV FISTULA PLACEMENT Left 12/26/2017   Procedure: LEFT BRACHIOCEPHALIC ARTERIOVENOUS (AV) FISTULA CREATION;  Surgeon: Conrad Flora, MD;  Location: [place];  Service: Vascular;  Laterality: Left;  . AV FISTULA PLACEMENT Left 11/02/2020   Procedure: INSERTION OF ARTERIOVENOUS (AV) GORE-TEX GRAFT ARM;  Surgeon: Waynetta Sandy, MD;  Location: [place];  Service: Vascular;  Laterality: Left;  . BASCILIC VEIN TRANSPOSITION Left 02/16/2018   Procedure: BRACHIOBASILIC VEIN TRANSPOSITION SECOND STAGE;  Surgeon: Conrad Ames, MD;  Location: [place];  Service: Vascular;  Laterality: Left;  . CATARACT EXTRACTION W/ INTRAOCULAR LENS  IMPLANT, BILATERAL Bilateral   . ESOPHAGEAL BANDING N/A 04/01/2018   Procedure: ESOPHAGEAL BANDING;  Surgeon: Otis Brace, MD;  Location: [facility];  Service: Gastroenterology;  Laterality: N/A;  . ESOPHAGEAL BANDING N/A 06/29/2018   Procedure: ESOPHAGEAL BANDING;  Surgeon: Otis Brace, MD;  Location: [facility];  Service: Gastroenterology;  Laterality: N/A;  . ESOPHAGOGASTRODUODENOSCOPY (EGD) WITH PROPOFOL N/A 04/01/2018   Procedure: ESOPHAGOGASTRODUODENOSCOPY (EGD) WITH PROPOFOL;  Surgeon: Otis Brace, MD;  Location: [facility];  Service: Gastroenterology;  Laterality: N/A;  . ESOPHAGOGASTRODUODENOSCOPY (EGD) WITH PROPOFOL N/A 06/29/2018   Procedure: ESOPHAGOGASTRODUODENOSCOPY (EGD) WITH PROPOFOL;  Surgeon: Otis Brace, MD;  [facility];  Service: Gastroenterology;  Laterality: N/A;  . ESOPHAGOGASTRODUODENOSCOPY (EGD) WITH PROPOFOL N/A 09/30/2018   Procedure: ESOPHAGOGASTRODUODENOSCOPY (EGD) WITH PROPOFOL;  Surgeon: Otis Brace, MD;  [facility]: [facility];  Service: Gastroenterology;  Laterality: N/A;  . ESOPHAGOGASTRODUODENOSCOPY (EGD) WITH PROPOFOL N/A  04/20/2019   Procedure: ESOPHAGOGASTRODUODENOSCOPY (EGD) WITH PROPOFOL;  Surgeon: Otis Brace, MD;  Location: [facility];  Service: Gastroenterology;  Laterality: N/A;  . ESOPHAGOGASTRODUODENOSCOPY (EGD) WITH PROPOFOL N/A 04/17/2020   Procedure: ESOPHAGOGASTRODUODENOSCOPY (EGD) WITH PROPOFOL;  Surgeon: Wonda Horner, MD;  Location: [place];  Service: Gastroenterology;  Laterality: N/A;  . I & D EXTREMITY Left 02/18/2018   Procedure: IRRIGATION AND DEBRIDEMENT LEFT ARM;  Surgeon: Serafina Mitchell, MD;  Location: [facility];  Service: Vascular;  Laterality: Left;  . INSERTION OF DIALYSIS CATHETER N/A 01/19/2018   Procedure: INSERTION OF DIALYSIS CATHETER - RIGHT INTERNAL JUGULAR PLACEMENT;  Surgeon: Rosetta Posner, MD;  Location: [place];  Service: Vascular;  Laterality: N/A;  . INSERTION OF DIALYSIS CATHETER Right 11/02/2020   Procedure: INSERTION OF TUNNELED DIALYSIS CATHETER;  Surgeon: Waynetta Sandy, MD;  Location: [place];  Service: Vascular;  Laterality: Right;  . IR FLUORO GUIDE CV LINE RIGHT  11/10/2017  . IR PARACENTESIS  12/10/2017  . IR PARACENTESIS  12/26/2017  . IR REMOVAL TUN CV CATH W/O FL  11/28/2017  . IR THROMBECTOMY AV FISTULA W/THROMBOLYSIS/PTA INC/SHUNT/IMG LEFT Left 06/02/2019  . IR US GUIDE VASC ACCESS LEFT  06/02/2019  . IR US GUIDE VASC ACCESS RIGHT  11/10/2017  . NECK SURGERY  2007   "back of neck; no hardware in there"  . REVISON OF ARTERIOVENOUS FISTULA Left 11/02/2020   Procedure: REVISON OF ARTERIOVENOUS FISTULA;  Surgeon: Waynetta Sandy, MD;  Location: [facility] OR;  Service: Vascular;  Laterality: Left;   Family History  Problem Relation Age of Onset  . Diabetes Mellitus II Sister   . Diabetes Sister   . Stroke Brother   . Heart attack Brother    Social History:  reports that he has never smoked. He has never used smokeless tobacco. He reports that he does not drink alcohol and does not use drugs.  ROS: As per HPI otherwise negative. Unable to get  full ROS - patient drowsy.  Physical Exam: Vitals:   11/13/20 0135 11/13/20 0337 11/13/20 0625 11/13/20 0705  BP:  (!) 114/45 139/64   Pulse:  81 77   Resp:  13 18   Temp:   98.4 F (36.9 C)   TempSrc:   Oral   SpO2:  98% 100%   Weight: 64.1 kg   63.3 kg  Height: 5' 5"  (1.651 m)        General: Chronically ill appearing man, NAD. Room air. Head: Normocephalic, atraumatic, sclera non-icteric, mucus membranes are moist. Neck: Supple without lymphadenopathy/masses. JVD not elevated. Lungs: Clear bilaterally to auscultation without wheezes, rales, or rhonchi. Breathing is unlabored. Heart: RRR with normal S1, S2. No murmurs, rubs, or gallops appreciated. Abdomen: Soft, non-tender, mild distention Musculoskeletal:  Strength and tone appear normal for age. Lower extremities: No edema or ischemic changes, no open wounds. Neuro: Drowsy appearing Dialysis Access: [facility]  Allergies  Allergen Reactions  . Heparin Other (See Comments)    Patient is Muslim and is not permitted Pork derative due to religious belief)  . Pork-Derived Products Other (See Comments)    Patient does not eat pork due to religious beliefs   Prior to Admission medications   Medication Sig Start Date End Date Taking? Authorizing Provider  calcium acetate (PHOSLO) 667 MG capsule Take 1 capsule (667 mg total) by mouth 3 (three) times daily with meals. 05/11/20  Aline August, MD  glucose blood (ONETOUCH VERIO) test strip USE 1 STRIP TO CHECK GLUCOSE THREE TIMES DAILY. DX:E11.65 10/18/20   Elayne Snare, MD  insulin degludec (TRESIBA FLEXTOUCH) 100 UNIT/ML FlexTouch Pen Inject 14 Units into the skin daily. Inject 14 units under the skin once daily. NO FURTHER REFILLS UNTIL PATIENT IS SEEN IN THE OFFICE Patient taking differently: Inject 12 Units into the skin daily before breakfast. 09/04/20   Elayne Snare, MD  Insulin Pen Needle (ADVOCATE INSULIN PEN NEEDLES) 31G X 5 MM MISC Use four times daily to inject insulin 04/27/18    Elayne Snare, MD  insulin regular (NOVOLIN R,HUMULIN R) 100 units/mL injection Inject 6-8 Units into the skin See admin instructions. Inject 8 units subcutaneously with breakfast, and 6 units with lunch and supper    [provider]  lactulose (CHRONULAC) 10 GM/15ML solution Take 45 mLs (30 g total) by mouth 3 (three) times daily. Patient taking differently: Take 30 g by mouth daily. 05/11/20   Aline August, MD  lidocaine-prilocaine (EMLA) cream Apply 1 application topically See admin instructions. Apply topically prior to dialysis on Tuesday, Thursday, Saturday 08/24/18   [provider]  Methoxy PEG-Epoetin Beta (MIRCERA IJ) 1 Dose See admin instructions. Dialysis gives this medication 05/09/20 05/08/21  [provider]  Multiple Vitamin (MULTIVITAMIN WITH MINERALS) TABS tablet Take 1 tablet by mouth daily.    [provider]  mupirocin ointment (BACTROBAN) 2 % Apply 1 application topically daily. Patient not taking: No sig reported 10/26/20   Criselda Peaches, DPM  ondansetron (ZOFRAN ODT) 4 MG disintegrating tablet Take 1 tablet (4 mg total) by mouth 2 (two) times daily as needed for nausea or vomiting. 05/01/20   [name], Madelynn Done C, PA-C  ONE TOUCH LANCETS MISC Use to test blood sugar three times daily 04/27/18   Elayne Snare, MD  oxyCODONE-acetaminophen (PERCOCET) 5-325 MG tablet Take 1 tablet by mouth every 4 (four) hours as needed for severe pain. Patient not taking: No sig reported 11/02/20 11/02/21  Waynetta Sandy, MD  rifaximin (XIFAXAN) 550 MG TABS tablet Take 1 tablet (550 mg total) by mouth 2 (two) times daily. 07/07/20   Shawna Clamp, MD  rosuvastatin (CRESTOR) 5 MG tablet Take 1 tablet (5 mg total) by mouth daily. 10/18/20   Elayne Snare, MD  sevelamer carbonate (RENVELA) 800 MG tablet Take 800 mg by mouth 3 (three) times daily. 09/25/20   [provider]   Current Facility-Administered Medications  Medication Dose Route Frequency Provider  Last Rate Last Admin  . acetaminophen (TYLENOL) tablet 650 mg  650 mg Oral Q6H PRN Asencion Noble, MD       Or  . acetaminophen (TYLENOL) suppository 650 mg  650 mg Rectal Q6H PRN Asencion Noble, MD      . insulin aspart (novoLOG) injection 0-15 Units  0-15 Units Subcutaneous TID WC Asencion Noble, MD      . lactulose (CHRONULAC) 10 GM/15ML solution 30 g  30 g Oral TID Sherry Ruffing, Marissa M, MD      . rifaximin Doreene Nest) tablet 550 mg  550 mg Oral BID Sherry Ruffing, Marissa M, MD      . rosuvastatin (CRESTOR) tablet 5 mg  5 mg Oral Daily Asencion Noble, MD       Labs: Basic Metabolic Panel: Recent Labs  Lab 11/09/20 0222 11/13/20 0128 11/13/20 0500  NA 136 134* 134*  K 4.0 3.9 3.8  CL 98 97* 96*  CO2 29 27 27  GLUCOSE 237* 132* 106*  BUN 35* 35* 36*  CREATININE 6.64* 6.77* 7.10*  CALCIUM 8.1* 8.0* 7.8*   Liver Function Tests: Recent Labs  Lab 11/09/20 0222 11/13/20 0128 11/13/20 0500  AST 30 41 36  ALT 9 13 12   ALKPHOS 127* 122 112  BILITOT 2.1* 1.8* 1.6*  PROT 5.8* 6.0* 5.4*  ALBUMIN 2.2* 2.3* 2.1*   Recent Labs  Lab 11/08/20 1532 11/13/20 0128  AMMONIA 152* 65*   CBC: Recent Labs  Lab 11/08/20 1532 11/09/20 0222 11/13/20 0128 11/13/20 0500  WBC 3.3* 3.1* 3.2* 3.1*  NEUTROABS 1.4*  --  1.8  --   HGB 9.4* 8.9* 8.1* 7.3*  HCT 29.2* 27.2* 24.7* 22.6*  MCV 84.4 84.7 83.7 83.1  PLT 43* 48* 50* 50*   Studies/Results: CT Head Wo Contrast  Result Date: 11/13/2020 CLINICAL DATA:  Sepsis, head injury EXAM: CT HEAD WITHOUT CONTRAST TECHNIQUE: Contiguous axial images were obtained from the base of the skull through the vertex without intravenous contrast. COMPARISON:  07/04/2020, 11/08/2020 FINDINGS: Brain: Normal anatomic configuration. Parenchymal volume loss is commensurate with the patient's age. Mild periventricular white matter changes are present likely reflecting the sequela of small vessel ischemia. Remote tiny pontine infarct again noted. No abnormal  intra or extra-axial mass lesion or fluid collection. No abnormal mass effect or midline shift. No evidence of acute intracranial hemorrhage or infarct. Ventricular size is normal. Cerebellum unremarkable. Vascular: No asymmetric hyperdense vasculature at the skull base. Skull: Intact Sinuses/Orbits: Paranasal sinuses are clear. Orbits are unremarkable. Other: Mastoid air cells and middle ear cavities are clear. IMPRESSION: No acute intracranial hemorrhage or infarct. Stable senescent changes. Electronically Signed   By: Fidela Salisbury MD   On: 11/13/2020 01:57   DG Chest Port 1 View  Result Date: 11/13/2020 CLINICAL DATA:  Weakness and falls. EXAM: PORTABLE CHEST 1 VIEW COMPARISON:  November 03, 2020 FINDINGS: There is stable right-sided venous catheter positioning. Diffuse, chronic appearing increased interstitial lung markings are noted. Mild linear atelectasis is noted within the right lung base. There is no evidence of a pleural effusion or pneumothorax. The cardiac silhouette is mildly enlarged. There is mild to moderate severity calcification of the aortic arch. Radiopaque surgical coils are seen overlying the medial aspect of the left upper quadrant. The visualized skeletal structures are unremarkable. IMPRESSION: 1. Chronic appearing increased interstitial lung markings with mild right basilar linear atelectasis. Electronically Signed   By: Virgina Norfolk M.D.   On: 11/13/2020 01:44    Dialysis Orders: TTS at [facility] 4hr, 400/500, EDW 63.5kg, 2K/2Ca, TDC, no heparin (cath is locked w/ NaCitrate or tPA) - Hectoral 63mg IV q HD - Mircera 743m Iv q 2 weeks (last given 3/29)  Assessment/Plan: 1.  Fever/Weakness: BCx drawn 4/11 - pending. Follow symptoms. 2.  ESRD: Continue HD per TTS schedule - next 4/12. No heparin. 3.  Hypertension/volume: BP stable, no edema on exam. At EDW today. 4.  Anemia of ESRD: Hgb 7.3 - ESA due tomorrow, ordered. 5.  Metabolic bone disease: CorrCa ok, Phos pending.  Continue home meds. 6.  Nutrition: Alb low - adding protein supplements. 7.  Cirrhosis/hepatic encephalopathy: Continue home meds 8.  Thrombocytopenia: Truly pancytopenia, follow.  KaVeneta PentonPA-C 11/13/2020, 10:14 AM  [employer]

## 2020-11-13 NOTE — ED Provider Notes (Signed)
[facility] EMERGENCY DEPARTMENT Provider Note   CSN: [id number] Arrival date & time: 11/13/20  0116     History No chief complaint on file.   William [name] is a 70 y.o. male.  The history is provided by the patient, medical records and the EMS personnel. The history is limited by a language barrier. A language interpreter was used (arabic).  Weakness Associated symptoms: fever     70 y.o. M with hx of anemia, ESRD on HD, cirrhosis, CHF, HTN, DM2, presenting to the ED for generalized weakness.  Per EMS, patient is from home with 2 falls today due to generalized weakness.  They did notice fever this evening up to 103F with EMS.  He had been give 1053m tylenol for this.  Patient was released from hospital 2 days ago after admission for liver issues.  With use of arabic interpreter-- patient states he feels very weak.  He reports 5-6 loose stools yesterday and today.  He does take chronic lactulose due to liver disease.  No vomiting.  No abdominal pain.  States when he fell earlier he did hit his head and has a headache now.  Denies other injuries.  He has dialysis (Tue, Thur, Sat) and has not missed any sessions.  Past Medical History:  Diagnosis Date  . Anemia   . Blind one eye   . CHF (congestive heart failure) ([facility]   . Cirrhosis (HCl[place]   . ESRD on hemodialysis ([facility]   . Goodpasture's disease ([facility]    on outpatient plasmapheresis/notes 11/20/2017  . Grade I diastolic dysfunction 051/88/4166 . History of anemia due to chronic kidney disease   . History of blood transfusion X 1   "UGIB; low blood count"  . History of plasmapheresis    "qod" (11/20/2017)  . Hypertension   . Pancytopenia ([facility] 08/20/2017  . Type II diabetes mellitus ([facility]     Patient Active Problem List   Diagnosis Date Noted  . Hepatic encephalopathy (HA[place] 11/08/2020  . AMS (altered mental status) 11/03/2020  . Diabetic foot infection ([facility] 10/06/2020  . Gangrene of toe of left foot  ([facility]   . Goals of care, counseling/discussion   . Palliative care by specialist   . AV fistula thrombosis, initial encounter ([facility] 06/01/2019  . AV fistula occlusion, initial encounter (HE[place]   . Chronic systolic CHF (congestive heart failure) (H[facility]   . Esophageal varices (HV[place] 09/23/2018  . Thrombocytopenia (HH[place] 09/23/2018  . CHF (congestive heart failure) ([facility] 09/22/2018  . ESRD on dialysis (HV[place] 02/06/2018  . CKD (chronic kidney disease) stage 4, GFR 15-29 ml/min (HCC) 12/19/2017  . Cirrhosis of liver with ascites (HV[place] 12/09/2017  . Goodpasture's syndrome (HD[place] with associated hemoptysis 11/20/2017  . Pancytopenia (HB[place] 11/06/2017  . Essential hypertension 11/06/2017  . Type II diabetes mellitus with renal manifestations (HD[place] 08/25/2015    Past Surgical History:  Procedure Laterality Date  . A/V FISTULAGRAM Left 10/16/2020   Procedure: A/V FISTULAGRAM;  Surgeon: CWaynetta Sandy MD;  [facility];  Service: Cardiovascular;  Laterality: Left;  . AMPUTATION TOE Left 10/07/2020   Procedure: AMPUTATION THIRD TOE;  Surgeon: MCriselda Peaches DPM;  [facility]  Service: Podiatry;  Laterality: Left;  . AV FISTULA PLACEMENT Left 12/26/2017   Procedure: LEFT BRACHIOCEPHALIC ARTERIOVENOUS (AV) FISTULA CREATION;  Surgeon: CConrad East Burke MD;  Location: [facility]  Service: Vascular;  Laterality: Left;  . AV FISTULA PLACEMENT Left 11/02/2020   Procedure: INSERTION OF  ARTERIOVENOUS (AV) GORE-TEX GRAFT ARM;  Surgeon: Waynetta Sandy, MD;  Location: [facility];  Service: Vascular;  Laterality: Left;  . BASCILIC VEIN TRANSPOSITION Left 02/16/2018   Procedure: BRACHIOBASILIC VEIN TRANSPOSITION SECOND STAGE;  Surgeon: Conrad Luzerne, MD;  Location: [facility];  Service: Vascular;  Laterality: Left;  . CATARACT EXTRACTION W/ INTRAOCULAR LENS  IMPLANT, BILATERAL Bilateral   . ESOPHAGEAL BANDING N/A 04/01/2018   Procedure: ESOPHAGEAL BANDING;  Surgeon: Otis Brace, MD;   [facility];  Service: Gastroenterology;  Laterality: N/A;  . ESOPHAGEAL BANDING N/A 06/29/2018   Procedure: ESOPHAGEAL BANDING;  Surgeon: Otis Brace, MD;  [facility];  Service: Gastroenterology;  Laterality: N/A;  . ESOPHAGOGASTRODUODENOSCOPY (EGD) WITH PROPOFOL N/A 04/01/2018   Procedure: ESOPHAGOGASTRODUODENOSCOPY (EGD) WITH PROPOFOL;  Surgeon: Otis Brace, MD;  [facility];  Service: Gastroenterology;  Laterality: N/A;  . ESOPHAGOGASTRODUODENOSCOPY (EGD) WITH PROPOFOL N/A 06/29/2018   Procedure: ESOPHAGOGASTRODUODENOSCOPY (EGD) WITH PROPOFOL;  Surgeon: Otis Brace, MD;  [facility];  Service: Gastroenterology;  Laterality: N/A;  . ESOPHAGOGASTRODUODENOSCOPY (EGD) WITH PROPOFOL N/A 09/30/2018   Procedure: ESOPHAGOGASTRODUODENOSCOPY (EGD) WITH PROPOFOL;  Surgeon: Otis Brace, MD;  [facility];  Service: Gastroenterology;  Laterality: N/A;  . ESOPHAGOGASTRODUODENOSCOPY (EGD) WITH PROPOFOL N/A 04/20/2019   Procedure: ESOPHAGOGASTRODUODENOSCOPY (EGD) WITH PROPOFOL;  Surgeon: Otis Brace, MD;  [facility];  Service: Gastroenterology;  Laterality: N/A;  . ESOPHAGOGASTRODUODENOSCOPY (EGD) WITH PROPOFOL N/A 04/17/2020   Procedure: ESOPHAGOGASTRODUODENOSCOPY (EGD) WITH PROPOFOL;  Surgeon: Wonda Horner, MD;  Location: [facility];  Service: Gastroenterology;  Laterality: N/A;  . I & D EXTREMITY Left 02/18/2018   Procedure: IRRIGATION AND DEBRIDEMENT LEFT ARM;  Surgeon: Serafina Mitchell, MD;  [facility];  Service: Vascular;  Laterality: Left;  . INSERTION OF DIALYSIS CATHETER N/A 01/19/2018   Procedure: INSERTION OF DIALYSIS CATHETER - RIGHT INTERNAL JUGULAR PLACEMENT;  Surgeon: Rosetta Posner, MD;  Location: [facility];  Service: Vascular;  Laterality: N/A;  . INSERTION OF DIALYSIS CATHETER Right 11/02/2020   Procedure: INSERTION OF TUNNELED DIALYSIS CATHETER;  Surgeon: Waynetta Sandy, MD;  Location: [facility];  Service: Vascular;  Laterality: Right;  . IR FLUORO GUIDE CV LINE RIGHT  11/10/2017  . IR PARACENTESIS  12/10/2017  . IR PARACENTESIS  12/26/2017  . IR REMOVAL TUN CV CATH W/O FL  11/28/2017  . IR THROMBECTOMY AV FISTULA W/THROMBOLYSIS/PTA INC/SHUNT/IMG LEFT Left 06/02/2019  . IR US GUIDE VASC ACCESS LEFT  06/02/2019  . IR US GUIDE VASC ACCESS RIGHT  11/10/2017  . NECK SURGERY  2007   "back of neck; no hardware in there"  . REVISON OF ARTERIOVENOUS FISTULA Left 11/02/2020   Procedure: REVISON OF ARTERIOVENOUS FISTULA;  Surgeon: Waynetta Sandy, MD;  [facility];  Service: Vascular;  Laterality: Left;       Family History  Problem Relation Age of Onset  . Diabetes Mellitus II Sister   . Diabetes Sister   . Stroke Brother   . Heart attack Brother     Social History   Tobacco Use  . Smoking status: Never Smoker  . Smokeless tobacco: Never Used  Vaping Use  . Vaping Use: Never used  Substance Use Topics  . Alcohol use: Never  . Drug use: Never    Home Medications Prior to Admission medications   Medication Sig Start Date End Date Taking? Authorizing Provider  calcium acetate (PHOSLO) 667 MG capsule Take 1 capsule (667 mg total) by mouth 3 (three) times daily with  meals. 05/11/20   Aline August, MD  glucose blood (ONETOUCH VERIO) test strip USE 1 STRIP TO CHECK GLUCOSE THREE TIMES DAILY. DX:E11.65 10/18/20   Elayne Snare, MD  insulin degludec (TRESIBA FLEXTOUCH) 100 UNIT/ML FlexTouch Pen Inject 14 Units into the skin daily. Inject 14 units under the skin once daily. NO FURTHER REFILLS UNTIL PATIENT IS SEEN IN THE OFFICE Patient taking differently: Inject 12 Units into the skin daily before breakfast. 09/04/20   Elayne Snare, MD  Insulin Pen Needle (ADVOCATE INSULIN PEN NEEDLES) 31G X 5 MM MISC Use four times daily to inject insulin 04/27/18   Elayne Snare, MD  insulin regular (NOVOLIN R,HUMULIN R) 100 units/mL injection Inject 6-8 Units into the skin See admin  instructions. Inject 8 units subcutaneously with breakfast, and 6 units with lunch and supper    [provider]  lactulose (CHRONULAC) 10 GM/15ML solution Take 45 mLs (30 g total) by mouth 3 (three) times daily. Patient taking differently: Take 30 g by mouth daily. 05/11/20   Aline August, MD  lidocaine-prilocaine (EMLA) cream Apply 1 application topically See admin instructions. Apply topically prior to dialysis on Tuesday, Thursday, Saturday 08/24/18   [provider]  Methoxy PEG-Epoetin Beta (MIRCERA IJ) 1 Dose See admin instructions. Dialysis gives this medication 05/09/20 05/08/21  [provider]  Multiple Vitamin (MULTIVITAMIN WITH MINERALS) TABS tablet Take 1 tablet by mouth daily.    [provider]  mupirocin ointment (BACTROBAN) 2 % Apply 1 application topically daily. Patient not taking: No sig reported 10/26/20   Criselda Peaches, DPM  ondansetron (ZOFRAN ODT) 4 MG disintegrating tablet Take 1 tablet (4 mg total) by mouth 2 (two) times daily as needed for nausea or vomiting. 05/01/20   [name], Madelynn Done C, PA-C  ONE TOUCH LANCETS MISC Use to test blood sugar three times daily 04/27/18   Elayne Snare, MD  oxyCODONE-acetaminophen (PERCOCET) 5-325 MG tablet Take 1 tablet by mouth every 4 (four) hours as needed for severe pain. Patient not taking: No sig reported 11/02/20 11/02/21  Waynetta Sandy, MD  rifaximin (XIFAXAN) 550 MG TABS tablet Take 1 tablet (550 mg total) by mouth 2 (two) times daily. 07/07/20   Shawna Clamp, MD  rosuvastatin (CRESTOR) 5 MG tablet Take 1 tablet (5 mg total) by mouth daily. 10/18/20   Elayne Snare, MD  sevelamer carbonate (RENVELA) 800 MG tablet Take 800 mg by mouth 3 (three) times daily. 09/25/20   [provider]    Allergies    Heparin and Pork-derived products  Review of Systems   Review of Systems  Constitutional: Positive for fever.  Neurological: Positive for weakness.  All other systems reviewed and  are negative.   Physical Exam Updated Vital Signs BP (!) 114/45 (BP Location: Right Arm)   Pulse 81   Temp (!) 100.6 F (38.1 C) (Oral)   Resp 13   Ht 5' 5"  (1.651 m)   Wt 64.1 kg   SpO2 98%   BMI 23.52 kg/m   Physical Exam Vitals and nursing note reviewed.  Constitutional:      Appearance: He is well-developed.     Comments: Appears thin, weak  HENT:     Head: Normocephalic and atraumatic.     Comments: Small contusion left side of scalp, no open wounds or bleeding noted Eyes:     Conjunctiva/sclera: Conjunctivae normal.     Pupils: Pupils are equal, round, and reactive to light.  Cardiovascular:     Rate and Rhythm: Normal  rate and regular rhythm.     Heart sounds: Normal heart sounds.  Pulmonary:     Effort: Pulmonary effort is normal. No respiratory distress.     Breath sounds: Normal breath sounds. No rhonchi.  Abdominal:     General: Bowel sounds are normal.     Palpations: Abdomen is soft.  Musculoskeletal:        General: Normal range of motion.     Cervical back: Normal range of motion.  Skin:    General: Skin is warm and dry.  Neurological:     Mental Status: He is alert and oriented to person, place, and time.     Comments: AAOx3, answering questions and following commands appropriately with assistance of interpreter; generalized weakness but no focal deficits noted     ED Results / Procedures / Treatments   Labs (all labs ordered are listed, but only abnormal results are displayed) Labs Reviewed  CBC WITH DIFFERENTIAL/PLATELET - Abnormal; Notable for the following components:      Result Value   WBC 3.2 (*)    RBC 2.95 (*)    Hemoglobin 8.1 (*)    HCT 24.7 (*)    Platelets 50 (*)    All other components within normal limits  COMPREHENSIVE METABOLIC PANEL - Abnormal; Notable for the following components:   Sodium 134 (*)    Chloride 97 (*)    Glucose, Bld 132 (*)    BUN 35 (*)    Creatinine, Ser 6.77 (*)    Calcium 8.0 (*)    Total Protein  6.0 (*)    Albumin 2.3 (*)    Total Bilirubin 1.8 (*)    GFR, Estimated 8 (*)    All other components within normal limits  AMMONIA - Abnormal; Notable for the following components:   Ammonia 65 (*)    All other components within normal limits  PROTIME-INR - Abnormal; Notable for the following components:   Prothrombin Time 15.8 (*)    INR 1.3 (*)    All other components within normal limits  RESP PANEL BY RT-PCR (FLU A&B, COVID) [id number]  URINE CULTURE  CULTURE, BLOOD (ROUTINE X 2)  CULTURE, BLOOD (ROUTINE X 2)  LACTIC ACID, PLASMA  URINALYSIS, ROUTINE W REFLEX MICROSCOPIC    EKG None  Radiology CT Head Wo Contrast  Result Date: 11/13/2020 CLINICAL DATA:  Sepsis, head injury EXAM: CT HEAD WITHOUT CONTRAST TECHNIQUE: Contiguous axial images were obtained from the base of the skull through the vertex without intravenous contrast. COMPARISON:  07/04/2020, 11/08/2020 FINDINGS: Brain: Normal anatomic configuration. Parenchymal volume loss is commensurate with the patient's age. Mild periventricular white matter changes are present likely reflecting the sequela of small vessel ischemia. Remote tiny pontine infarct again noted. No abnormal intra or extra-axial mass lesion or fluid collection. No abnormal mass effect or midline shift. No evidence of acute intracranial hemorrhage or infarct. Ventricular size is normal. Cerebellum unremarkable. Vascular: No asymmetric hyperdense vasculature at the skull base. Skull: Intact Sinuses/Orbits: Paranasal sinuses are clear. Orbits are unremarkable. Other: Mastoid air cells and middle ear cavities are clear. IMPRESSION: No acute intracranial hemorrhage or infarct. Stable senescent changes. Electronically Signed   By: Fidela Salisbury MD   On: 11/13/2020 01:57   DG Chest Port 1 View  Result Date: 11/13/2020 CLINICAL DATA:  Weakness and falls. EXAM: PORTABLE CHEST 1 VIEW COMPARISON:  November 03, 2020 FINDINGS: There is stable right-sided venous catheter  positioning. Diffuse, chronic appearing increased interstitial lung markings are noted. Mild  linear atelectasis is noted within the right lung base. There is no evidence of a pleural effusion or pneumothorax. The cardiac silhouette is mildly enlarged. There is mild to moderate severity calcification of the aortic arch. Radiopaque surgical coils are seen overlying the medial aspect of the left upper quadrant. The visualized skeletal structures are unremarkable. IMPRESSION: 1. Chronic appearing increased interstitial lung markings with mild right basilar linear atelectasis. Electronically Signed   By: Virgina Norfolk M.D.   On: 11/13/2020 01:44    Procedures Procedures   Medications Ordered in ED Medications - No data to display  ED Course  I have reviewed the triage vital signs and the nursing notes.  Pertinent labs & imaging results that were available during my care of the patient were reviewed by me and considered in my medical decision making (see chart for details).    MDM Rules/Calculators/A&P    70 year old male presenting to the ED with generalized weakness, fever, and falls.  States he feels weak all over and has fallen twice today.  He did strike his head but denies loss of consciousness.  He states he has had multiple loose stools over the past 48 hours.  He denies feeling confused.  He is awake, alert, able to interact with interpreter.  He does complain of headache but no focal neurologic deficits seen on exam.  Does have fever here of 100.60F.  Was given tylenol PTA for same.  Will obtain CT head, chest x-ray, labs including cultures, lactate, and ammonia given his history of liver disease.  Labs as above, anemia which appears to be chronic and likely related to his ESRD.  Potassium is normal.  Lactate also normal.  Ammonia 65.  covid and flu screen negative.  CXR clear.  CT head negative.  No clear source of infection, stable VS, normal lactate.  Will hold on IV abx for now.  3:42  AM Patient's son at bedside now-- states when patient was discharged Friday he was very weak and this seemed to worsen over the weekend.  States when he walks he seems unstable, like his legs will give out.  This is what happened during both falls today.  States he is not sure how to help him with that.  Currently he does not do any PT/OT.  Fever began today as well-- no real infectious symptoms per son.  It seems he has been having intermittent fevers during hospitalizations recently.  Reports they have been giving lactulose as normal with frequent loose stools so not sure why ammonia has gotten high again.  States he has not noticed frank confusion.  4:13 AM Discussed with IM residents-- they will evaluate in the ED and admit for ongoing care.    Final Clinical Impression(s) / ED Diagnoses Final diagnoses:  Frequent falls  Generalized weakness  Increased ammonia level    Rx / DC Orders ED Discharge Orders    None       Larene Pickett, PA-C 11/13/20 Lane, MD 11/13/20 [phone]

## 2020-11-13 NOTE — Progress Notes (Signed)
New Admission Note:   Arrival Method: from ED via stretcher Mental Orientation: Ax0x4, speaks arabic, needs interpreter Telemetry: N/A Assessment: to be completed Skin: intact, L 2nd toe amputated healed IV: R wrist and RAC, NSL Pain: 0/10 Tubes: None Safety Measures: Safety Fall Prevention Plan has been discussed  Admission: to be completed 5 [place] Orientation: Patient has been oriented to the room, unit and staff.   Family: son at bedside  Orders to be reviewed and implemented. Will continue to monitor the patient. Call light has been placed within reach and bed alarm has been activated.

## 2020-11-13 NOTE — H&P (Signed)
Date: 11/13/2020               Patient Name:  William [name] MRN: [id number]  DOB: January 23, 1951 Age / Sex: 70 y.o., male   PCP: Horald Pollen, MD         Medical Service: Internal Medicine Teaching Service         Attending Physician: Dr. Jimmye Norman, Elaina Pattee, MD    First Contact: Dr. Shon Baton Pager: [phone]  Second Contact: Dr. Darrick Meigs Pager: [phone]       After Hours (After 5p/  First Contact Pager: [phone]  weekends / holidays): Second Contact Pager: [phone]   Chief Complaint: weakness, fever  History of Present Illness:  William [name] is 70yo cis male (he/him) with ESRD on TTS HD, T2DM, HFpEF, Goodpasture syndrome, cirrhosis 2/2 NAFLD s/p TIPS (04/2020), HTN presenting to [facility] via EMS from home for weakness and fever. Patient has been recently admitted twice to IMTS for altered mental status, the first concerning for sedation-related affects after revision of AV graft and the second due to hepatic encephalopathy. After discharge patient initially functioning at baseline without issues. In the last couple of days, patient progressively has become weak. States he had 5 non-bloody watery bowel movements two days ago and 6 yesterday. Late yesterday and overnight he had a fever of 101 at home. Denies sick contacts, abdominal pain, changes in urine, dyspnea, changes in diet. Mentions he had two unwitnessed falls yesterday, one time hitting his head. Describes the falls as "his legs giving out." States he was on the toilet, got up, and his legs gave out. Denies dizziness, balance issues, breathing problems, or changes in urine. Of note, patient did attend his dialysis session Friday to full completion. He has been taking lactulose once daily. He last received antibiotics during his first admission. This included 1 dose cefazolin on 3/31, two doses cefepime (last dose 4/1), and two days vancomycin (last dose 4/1). Previous blood cultures no growth to date at 3 and 5 days  respectively.  Meds:  Lactulose 66m TID (taking qd) Rifaximin 5510mBID Novolin 6-8U BID Tresiba 14U qd Zofran 50m52mID PRN Percocet 5-325m150mh PRN Rosuvastatin 5mg 53mSevelamer 800mg 19m Allergies: Allergies as of 11/13/2020 - Review Complete 11/13/2020  Allergen Reaction Noted  . Heparin Other (See Comments) 11/11/2017  . Pork-derived products Other (See Comments) 02/18/2018   Past Medical History:  Diagnosis Date  . Anemia   . Blind one eye   . CHF (congestive heart failure) (HCC)  [place]  [place] [facility]  . Grade I diastolic dysfunction 10/14/01/21/2248story of anemia due to chronic kidney disease   . History of blood transfusion X 1   "UGIB; low blood count"  . History of plasmapheresis    "qod" (11/20/2017)  . Hypertension   . [place]topenia (HCC) [place]  . Type II diabetes mellitus (HCC)    Family History:  Family History  Problem Relation Age of Onset  . Diabetes Mellitus II Sister   . Diabetes Sister   . Stroke Brother   . Heart attack Brother    Social History:  Patient lives with son and his family. At baseline converses with family and ambulates without assistance. Patient's son and wife handle his medications. Denies history of smoking, alcohol, or recreational drugs.  Review of Systems: A complete ROS was negative  except as per HPI.   Physical Exam: Blood pressure (!) 114/45, pulse 81, temperature (!) 100.6 F (38.1 C), temperature source Oral, resp. rate 13, height 5' 5"  (1.651 m), weight 64.1 kg, SpO2 98 %. General: Chronically ill-appearing. Laying in bed in no acute distress, non-toxic appearing, non-diaphoretic. HENT: Normocephalic. Minimal soft tissue swelling on L temporal side of head. No breakage of skin present. Eyes: Scleral icterus present. Normal lids. Regular rate, rhythm. 2/6 systolic murmur best heard at RUSB. Distal pulses  2+ bilaterally. Pulm: Normal work of breathing. Clear to auscultation bilaterally. No wheezing, rhonchi, rales. GI: Soft, non-tender, non-distended. Normoactive bowel sounds.  MSK: No pitting edema bilaterally. Bilateral legs thin. Skin: Site of L third toe amputation with granulation tissue present. No drainage, erythema present. AV graft of L arm without drainage, erythema. RIJ HD tunneled catheter in place without visible drainage or erythema. Otherwise skin warm, dry. Neuro: Awake, alert, answering questions appropriately. Strength throughout 4/5. No asterixis present.  CBC Latest Ref Rng & Units 11/13/2020 11/09/2020 11/08/2020  WBC 4.0 - 10.5 K/uL 3.2(L) 3.1(L) 3.3(L)  Hemoglobin 13.0 - 17.0 g/dL 8.1(L) 8.9(L) 9.4(L)  Hematocrit 39.0 - 52.0 % 24.7(L) 27.2(L) 29.2(L)  Platelets 150 - 400 K/uL 50(L) 48(L) 43(L)   BMP Latest Ref Rng & Units 11/13/2020 11/09/2020 11/08/2020  Glucose 70 - 99 mg/dL 132(H) 237(H) 244(H)  BUN 8 - 23 mg/dL 35(H) 35(H) 27(H)  Creatinine 0.61 - 1.24 mg/dL 6.77(H) 6.64(H) 5.89(H)  BUN/Creat Ratio 10 - 24 - - -  Sodium 135 - 145 mmol/L 134(L) 136 137  Potassium 3.5 - 5.1 mmol/L 3.9 4.0 4.4  Chloride 98 - 111 mmol/L 97(L) 98 98  CO2 22 - 32 mmol/L 27 29 29   Calcium 8.9 - 10.3 mg/dL 8.0(L) 8.1(L) 8.0(L)   Hepatic Function Latest Ref Rng & Units 11/13/2020 11/09/2020 11/08/2020  Total Protein 6.5 - 8.1 g/dL 6.0(L) 5.8(L) 6.1(L)  Albumin 3.5 - 5.0 g/dL 2.3(L) 2.2(L) 2.4(L)  AST 15 - 41 U/L 41 30 37  ALT 0 - 44 U/L 13 9 5   Alk Phosphatase 38 - 126 U/L 122 127(H) 143(H)  Total Bilirubin 0.3 - 1.2 mg/dL 1.8(H) 2.1(H) 2.0(H)  Bilirubin, Direct 0.1 - 0.5 mg/dL - - -   Lactic 1.9 > Ammonia 62  CXR: personally reviewed my interpretation is increased interstitial markings from previous study. No lobar opacity.  CT head: No acute intracranial hemorrhage or infarct. Stable senescent changes.  Assessment & Plan by Problem: William [name] is 70yo cis male (he/him) with ESRD on  TTS HD, T2DM, HFpEF, Goodpasture syndrome, cirrhosis 2/2 NAFLD s/p TIPS (04/2020), HTN admitted 4/11 with generalized weakness, diarrhea, and fever of unknown origin in setting of chronic leukopenia and multiple chronic disease states.  Active Problems:   FUO (fever of unknown origin)  #Fever, unknown source #Fall, generalized weakness #Diarrhea Patient presenting with 2-3d progressive generalized weakness with subsequent diarrhea and fever. On arrival to ED, patient febrile 100.19F (T 103F per EMS, given Tylenol), mildly elevated BP, sating well on room air. Initial lab work revealed no acute changes since discharge - chronic pancytopenia, hyperbilirubinemia, hypoalbuminemia. Lactic 1.9, ammonia 62. On our exam, patient is mentating appropriately. Abdominal and pulmonary exams benign. RIJ in place without signs of infection, site of L toe amputation also without clear signs of infection. Unclear source of patient's symptoms. He is high risk given pancytopenia with multiple chronic disease states. He previously had intermittent mild fevers during previous admission, although highest fever of 100.8.  Last antibiotic over a week ago and he only received two days total. Possibly lactulose causing diarrhea, although appears he has been taking the same dose. Given he is high risk and has been febrile can consider C diff testing. Will hold off antibiotic at this time given no clear source. Generalized weakness possibly 2/2 dialysis vs chronic state. Likely will benefit from physical therapy.  - F/u UA, UCx, BCx - PT/OT - Daily CBC, BMP  #Cirrhosis 2/2 NAFLD s/p TIPS, embolization of varices, gastrorenal shunt (04/2020) Patient previously admitted for hepatic encephalopathy. Reports taking 1 dose lactulose daily, 5-6 BM/day over the last couple of days. He is currently mentally at baseline. Of note, is seen by [facility] for [facility] for transplant. Last visit in March (next in September). Per chart,  there was consideration of downsizing TIPS if continues to have hepatic encephalopathy. This is patient's third admission over the last 12 days. Likely patient would benefit from evaluation for transplant int he nearby future. - Lactulose 30g TID, rifaximin 570m qd - F/u CMP [facility] #Goodpasture syndrome [facility] EDW 63.5kf. BP mildly elevated, electrolytes stable. Patient having normal urine output, no symptoms. No abdominal pain. Obtaining UA and urine culture. He is being evaluated by [facility] for possible kidney transplant as well. Will need to consult nephrology in AM for scheduled dialysis tomorrow.  - Nephrology consult in AM - F/u BMP - Strict I&O's  #Type 2 diabetes mellitus A1c 6.2% in March. He is insulin dependent and takes 14U degludec daily and 6-8U Novolin BID. Glucose 132 on arrival.  - CBG QID - SSI  #Duodenal mass Per chart review, recent CT abdomen with 1.1cm lesion within duodenum, similar to previous imaging in 2020. Per report, concerning for neuroendocrine tumor and is beinr referred for EUS for further evaluation. [facility] - F/u [facility]  #HFpEF Appears euvolemic on exam. As part of evaluation for transplant, recently underwent pharmacologic stress test. Revealed normal myocardial perfusion and normal LV function, not on any current medications.  #Hypertension Patient not on antihypertensives, previously developed hypotension with medications. Controlled with HD.  Dispo: Admit patient to Inpatient with expected length of stay greater than 2 midnights.  Signed: BSanjuan Dame MD 11/13/2020, 4:06 AM  Pager: [phone] 5pm on weekdays and 1pm on weekends: On Call pager: [phone]

## 2020-11-13 NOTE — Evaluation (Signed)
Physical Therapy Evaluation Patient Details Name: BELTON PEPLINSKI MRN: [id number] DOB: 05/26/1951 Today's Date: 11/13/2020   History of Present Illness  Neil is 70 y/o male who reported to ED following a fall and complaints of weakness, diarrhea, and fever on 11/13/20. CT negative for acute findings. PMH includes ESRD on HD, Goodpasture syndrome, CHF, cirrohsis, HTN, DM, duodenal mass and recent L toe amputation.    Clinical Impression  Pt received in bed with son present with above diagnosis and subsequent problems. Per son, pt moving well at home. During PT session, generally min guard for OOB mobility. Pt instructed to solely weight bear through heel since darco shoe unavailable but pt with difficulty maintaining precaution so deferred further gait. Plan to progress gait distance further if darco shoe available from home. Pt would benefit from continued PT to improve balance, address endurance, and decrease risk for falls. Will continue to follow acutely.     Follow Up Recommendations Outpatient PT    Equipment Recommendations  None recommended by PT    Recommendations for Other Services       Precautions / Restrictions Precautions Precautions: Fall Precaution Comments: L toe amputatiuon, partially blind in R eye Restrictions Weight Bearing Restrictions: Yes LLE Weight Bearing: Weight bearing as tolerated Other Position/Activity Restrictions: WBAT with darco shoe      Mobility  Bed Mobility Overal bed mobility: Needs Assistance Bed Mobility: Supine to Sit     Supine to sit: Modified independent (Device/Increase time)          Transfers Overall transfer level: Needs assistance Equipment used: Rolling walker (2 wheeled) Transfers: Sit to/from Stand Sit to Stand: Min guard         General transfer comment: min guard for safety, poor handplacement  Ambulation/Gait Ambulation/Gait assistance: Min guard Gait Distance (Feet): 20 Feet Assistive device: Rolling  walker (2 wheeled) Gait Pattern/deviations: Step-through pattern;Decreased weight shift to left;Narrow base of support Gait velocity: decreased   General Gait Details: min guard for safety, cueing to maintain precautions  Stairs            Wheelchair Mobility    Modified Rankin (Stroke Patients Only)       Balance Overall balance assessment: Needs assistance   Sitting balance-Leahy Scale: Good       Standing balance-Leahy Scale: Poor Standing balance comment: Benefits from RW to help maintain precautions without darco shoe                             Pertinent Vitals/Pain Pain Assessment: Faces Faces Pain Scale: Hurts a little bit Pain Location: abdominal area Pain Descriptors / Indicators: Discomfort;Aching Pain Intervention(s): Repositioned;Monitored during session    Home Living Family/patient expects to be discharged to:: Private residence Living Arrangements: Children Available Help at Discharge: Family Type of Home: House Home Access: Stairs to enter Entrance Stairs-Rails: Right;Left;Can reach both Entrance Stairs-Number of Steps: 3 Home Layout: Two level;1/2 bath on main level;Able to live on main level with bedroom/bathroom Home Equipment: Gilford Rile - 2 wheels;Cane - single point      Prior Function Level of Independence: Independent         Comments: was walking well per family     Hand Dominance        Extremity/Trunk Assessment   Upper Extremity Assessment Upper Extremity Assessment: Defer to OT evaluation    Lower Extremity Assessment Lower Extremity Assessment: Overall WFL for tasks assessed    Cervical / Trunk  Assessment Cervical / Trunk Assessment: Normal  Communication   Communication: Prefers language other than English  Cognition Arousal/Alertness: Awake/alert Behavior During Therapy: WFL for tasks assessed/performed Overall Cognitive Status: Within Functional Limits for tasks assessed                                  General Comments: Son present and interpreting. Difficult to fully assess due to language barrier, but no concerns from son. Did seem a little confused, when asked to walk around the bed, pt started walking backwards      General Comments      Exercises     Assessment/Plan    PT Assessment Patient needs continued PT services  PT Problem List Decreased strength;Decreased range of motion;Decreased knowledge of use of DME;Decreased activity tolerance;Decreased balance;Decreased mobility       PT Treatment Interventions DME instruction;Balance training;Neuromuscular re-education;Gait training;Stair training;Functional mobility training;Patient/family education;Therapeutic activities;Therapeutic exercise    PT Goals (Current goals can be found in the Care Plan section)  Acute Rehab PT Goals Patient Stated Goal: stomach feel better PT Goal Formulation: With patient Time For Goal Achievement: 11/27/20 Potential to Achieve Goals: Good    Frequency Min 3X/week   Barriers to discharge        Co-evaluation               AM-PAC PT "6 Clicks" Mobility  Outcome Measure Help needed turning from your back to your side while in a flat bed without using bedrails?: None Help needed moving from lying on your back to sitting on the side of a flat bed without using bedrails?: None Help needed moving to and from a bed to a chair (including a wheelchair)?: A Little Help needed standing up from a chair using your arms (e.g., wheelchair or bedside chair)?: A Little Help needed to walk in hospital room?: A Little Help needed climbing 3-5 steps with a railing? : A Little 6 Click Score: 20    End of Session Equipment Utilized During Treatment: Gait belt Activity Tolerance: Patient tolerated treatment well Patient left: in chair;with call bell/phone within reach;with family/visitor present Nurse Communication: Mobility status PT Visit Diagnosis: Unsteadiness on feet  (R26.81);History of falling (Z91.81)    Time:  -      Charges:         [name], SPT

## 2020-11-14 DIAGNOSIS — R4182 Altered mental status, unspecified: Secondary | ICD-10-CM | POA: Diagnosis not present

## 2020-11-14 DIAGNOSIS — R6521 Severe sepsis with septic shock: Secondary | ICD-10-CM | POA: Diagnosis not present

## 2020-11-14 DIAGNOSIS — R509 Fever, unspecified: Secondary | ICD-10-CM | POA: Diagnosis not present

## 2020-11-14 DIAGNOSIS — N186 End stage renal disease: Secondary | ICD-10-CM | POA: Diagnosis not present

## 2020-11-14 LAB — RENAL FUNCTION PANEL
Albumin: 2.2 g/dL — ABNORMAL LOW (ref 3.5–5.0)
Anion gap: 16 — ABNORMAL HIGH (ref 5–15)
BUN: 52 mg/dL — ABNORMAL HIGH (ref 8–23)
CO2: 22 mmol/L (ref 22–32)
Calcium: 7.7 mg/dL — ABNORMAL LOW (ref 8.9–10.3)
Chloride: 94 mmol/L — ABNORMAL LOW (ref 98–111)
Creatinine, Ser: 9.15 mg/dL — ABNORMAL HIGH (ref 0.61–1.24)
GFR, Estimated: 6 mL/min — ABNORMAL LOW (ref >60)
Glucose, Bld: 314 mg/dL — ABNORMAL HIGH (ref 70–99)
Phosphorus: 5.7 mg/dL — ABNORMAL HIGH (ref 2.5–4.6)
Potassium: 4.4 mmol/L (ref 3.5–5.1)
Sodium: 132 mmol/L — ABNORMAL LOW (ref 135–145)

## 2020-11-14 LAB — CULTURE, BLOOD (ROUTINE X 2)
Culture: NO GROWTH
Culture: NO GROWTH
Special Requests: ADEQUATE

## 2020-11-14 LAB — IRON AND TIBC
Iron: 32 ug/dL — ABNORMAL LOW (ref 45–182)
Saturation Ratios: 18 % (ref 17.9–39.5)
TIBC: 174 ug/dL — ABNORMAL LOW (ref 250–450)
UIBC: 142 ug/dL

## 2020-11-14 LAB — CBC WITH DIFFERENTIAL/PLATELET
Abs Immature Granulocytes: 0.01 10*3/uL (ref 0.00–0.07)
Basophils Absolute: 0 10*3/uL (ref 0.0–0.1)
Basophils Relative: 0 %
Eosinophils Absolute: 0.1 10*3/uL (ref 0.0–0.5)
Eosinophils Relative: 3 %
HCT: 24.9 % — ABNORMAL LOW (ref 39.0–52.0)
Hemoglobin: 8.1 g/dL — ABNORMAL LOW (ref 13.0–17.0)
Immature Granulocytes: 0 %
Lymphocytes Relative: 33 %
Lymphs Abs: 0.9 10*3/uL (ref 0.7–4.0)
MCH: 26.9 pg (ref 26.0–34.0)
MCHC: 32.5 g/dL (ref 30.0–36.0)
MCV: 82.7 fL (ref 80.0–100.0)
Monocytes Absolute: 0.6 10*3/uL (ref 0.1–1.0)
Monocytes Relative: 24 %
Neutro Abs: 1 10*3/uL — ABNORMAL LOW (ref 1.7–7.7)
Neutrophils Relative %: 40 %
Platelets: 42 10*3/uL — ABNORMAL LOW (ref 150–400)
RBC: 3.01 MIL/uL — ABNORMAL LOW (ref 4.22–5.81)
RDW: 15 % (ref 11.5–15.5)
WBC: 2.6 10*3/uL — ABNORMAL LOW (ref 4.0–10.5)
nRBC: 0 % (ref 0.0–0.2)

## 2020-11-14 LAB — URINALYSIS, ROUTINE W REFLEX MICROSCOPIC
Glucose, UA: 250 mg/dL — AB
Ketones, ur: NEGATIVE mg/dL
Leukocytes,Ua: NEGATIVE
Nitrite: NEGATIVE
Protein, ur: >300 mg/dL — AB
Specific Gravity, Urine: 1.02 (ref 1.005–1.030)
pH: 7 (ref 5.0–8.0)

## 2020-11-14 LAB — URINALYSIS, MICROSCOPIC (REFLEX)

## 2020-11-14 LAB — GLUCOSE, CAPILLARY
Glucose-Capillary: 297 mg/dL — ABNORMAL HIGH (ref 70–99)
Glucose-Capillary: 117 mg/dL — ABNORMAL HIGH (ref 70–99)
Glucose-Capillary: 264 mg/dL — ABNORMAL HIGH (ref 70–99)

## 2020-11-14 MED ORDER — INSULIN DETEMIR 100 UNIT/ML ~~LOC~~ SOLN
10.0000 [IU] | Freq: Every day | SUBCUTANEOUS | Status: DC
  Filled 2020-11-14 (×2): qty 0.1

## 2020-11-14 MED ORDER — DARBEPOETIN ALFA 100 MCG/0.5ML IJ SOSY
PREFILLED_SYRINGE | INTRAMUSCULAR | Status: AC
  Filled 2020-11-14: qty 0.5

## 2020-11-14 MED ORDER — SODIUM CHLORIDE 0.9 % IV SOLN: 100.0000 mL | INTRAVENOUS | Status: DC | PRN

## 2020-11-14 MED ORDER — CALCIUM ACETATE (PHOS BINDER) 667 MG PO CAPS
667.0000 mg | ORAL_CAPSULE | Freq: Three times a day (TID) | ORAL | Status: DC
  Administered 2020-11-14 – 2020-11-15 (×3): 667 mg via ORAL
  Filled 2020-11-14 (×3): qty 1

## 2020-11-14 MED ORDER — ANTICOAGULANT SODIUM CITRATE 4% (200MG/5ML) IV SOLN
5.0000 mL | Status: DC | PRN
  Administered 2020-11-14: 5 mL
  Filled 2020-11-14 (×2): qty 5

## 2020-11-14 MED ORDER — ALTEPLASE 2 MG IJ SOLR: 2.0000 mg | Freq: Once | INTRAMUSCULAR | Status: DC | PRN

## 2020-11-14 MED ORDER — CHLORHEXIDINE GLUCONATE CLOTH 2 % EX PADS
6.0000 | MEDICATED_PAD | Freq: Every day | CUTANEOUS | Status: DC
  Administered 2020-11-14: 6 via TOPICAL

## 2020-11-14 NOTE — Progress Notes (Addendum)
HD#1 Subjective:   Overnight, patient febrile with Tmax 101.3.   Patient seen initially during dialysis, son not at bedside. Patient resting with sheet pulled over his head however arouses easily once sheet pulled down. He appears comfortable, gives a thumbs up.  Later in the afternoon met patient's son at bedside. Son reports, and nurse confirms, patient has not had diarrhea since yesterday, did not receive lactulose as he was in HD. Son reports patient has a ~2-year history of diarrhea, stating his BMs are sometimes watery, sometimes soft, sometimes normal. When asked if patient ever has diarrhea without lactulose administration, and son reports that since his TIPS procedure he has not had diarrhea unless it is after lactulose. Son shares that the patient was taken off of the active transplant list after diabetic foot wound. States patient's mental status continues to be at his baseline.  Objective:   Vital signs in last 24 hours: Vitals:   11/13/20 1443 11/13/20 1708 11/13/20 2105 11/14/20 0531  BP: (!) 144/58 (!) 135/49 (!) 139/55 (!) 141/55  Pulse: 85 82 84 75  Resp: 18 19 18 18   Temp: 99.1 F (37.3 C) 99.3 F (37.4 C) (!) 101.3 F (38.5 C) 98.8 F (37.1 C)  TempSrc: Oral Oral Oral Oral  SpO2: 100% 97% 99% 99%  Weight:      Height:       Physical Exam Constitutional: chronically-ill appearing man lying in bed, sheet initially pulled over head but arouses easily once sheet removed, in no acute distress HENT: normocephalic, minimal soft tissue swelling on L temple, no breakage of skin, mucous membranes moist Eyes: scleral icterus present Cardiovascular: regular rate and rhythm, 2/6 systolic murmur best heard at RUSB. DP pulses 2+ bilaterally. No lower extremity edema. Pulmonary/Chest: normal work of breathing on room air Abdominal: non-distended MSK: Extremities are thin. L third toe amputated. Neurological: alert & oriented x 3 Skin: bilateral feet warm and dry; site of  L third toe amputation healing without drainage, erythema, or tenderness to palpation of the area; stable areas of callous, including large callous on the superior aspect of R third toe which is tender to palpation. Dry, healing fissure on dorsum of foot between 3rd and 4th toes. Left upper extremity warm to touch with small area of fluctuance and induration, palpable thrill.   Pertinent Labs: CBC Latest Ref Rng & Units 11/13/2020 11/13/2020 11/09/2020  WBC 4.0 - 10.5 K/uL 3.1(L) 3.2(L) 3.1(L)  Hemoglobin 13.0 - 17.0 g/dL 7.3(L) 8.1(L) 8.9(L)  Hematocrit 39.0 - 52.0 % 22.6(L) 24.7(L) 27.2(L)  Platelets 150 - 400 K/uL 50(L) 50(L) 48(L)    CMP Latest Ref Rng & Units 11/13/2020 11/13/2020 11/09/2020  Glucose 70 - 99 mg/dL 106(H) 132(H) 237(H)  BUN 8 - 23 mg/dL 36(H) 35(H) 35(H)  Creatinine 0.61 - 1.24 mg/dL 7.10(H) 6.77(H) 6.64(H)  Sodium 135 - 145 mmol/L 134(L) 134(L) 136  Potassium 3.5 - 5.1 mmol/L 3.8 3.9 4.0  Chloride 98 - 111 mmol/L 96(L) 97(L) 98  CO2 22 - 32 mmol/L 27 27 29   Calcium 8.9 - 10.3 mg/dL 7.8(L) 8.0(L) 8.1(L)  Total Protein 6.5 - 8.1 g/dL 5.4(L) 6.0(L) 5.8(L)  Total Bilirubin 0.3 - 1.2 mg/dL 1.6(H) 1.8(H) 2.1(H)  Alkaline Phos 38 - 126 U/L 112 122 127(H)  AST 15 - 41 U/L 36 41 30  ALT 0 - 44 U/L 12 13 9     Assessment/Plan:   Principal Problem:   FUO (fever of unknown origin) Active Problems:   Type II diabetes  mellitus with renal manifestations ([place])   ESRD on dialysis [facility])   Patient Summary:  William [name] is a 70 y.o. man with ESRD 2/2 Goodpasture syndrome on TTS HD, T2DM, HFpEF, cirrhosis 2/2 NAFLD s/p TIPS (04/2020), HTN admitted 4/11 with generalized weakness, diarrhea, and fever of unknown origin in setting of chronic leukopenia and multiple chronic disease states.  This is hospital day 1.  Fever, unknown source Diarrhea Fall, generalized weakness Febrile overnight (Tmax 101.3) but otherwise hemodynamically stable. C difficile negative. BCx negative at 24  hours. Urinalysis with glucosuria (250), proteinuria (>300), microhematuria (21-50), 6-10 WBC, rare bacteria. Urine culture pending. Orthostatics negative. Suspect skin changes of feet represent underlying chronic infection. As above, infectious work-up for fever so far unrevealing. Left upper extremity noted to have stable warmth and induration. Given premature access of LUE fistula revision, will consult vascular for reevaluation of recent AVF revision. As mentioned in H&P, recent CT abdomen (10/09/20) showed 1.1 cm lesions within the duodenum which per radiology report is concerning for possible neuroendocrine tumor. Patient is being referred for EUS for further evaluation and has appointment with [facility] oncology on 11/29/20. In patient's chronically ill state, malignancy may be disguised. As son reported today, patient's diarrhea may be more acute on chronic in nature. GI malignancy could cause fever. Currently patient is only tolerating once daily lactulose due to diarrhea, and his mentation is at his baseline. Will continue to monitor blood and urine cultures.  - consult vascular surgery to evaluate recent AVF revision - F/u urine and blood cultures - PT recommends outpatient PT; no OT follow-up - Daily CBC, BMP  Cirrhosis 2/2 NAFLD s/p TIPS, embolization of varices, gastrorenal shunt (04/2020) Patient mentating well so far this admission. Patient tolerating 1 dose of lactulose daily due to continued loose stools.  - Lactulose 30g TID, rifaximin 570m qd - [facility] Goodpasture syndrome Management per nephrology. HD today.   Type 2 diabetes mellitus A1c 6.2% in March. He is insulin dependent and takes 14U degludec daily and 6-8U Novolin BID. Fasting glucose 297 this morning. Will increase Levemir 7>10 units.  - CBG QID - Levemir 10 units QHS - SSI - very sensitive  HFpEF Appears euvolemic on exam.  Hypertension Patient not on antihypertensives,  previously developed hypotension with medications. Controlled with HD.   Diet: Renal IVF: None VTE: SCDs Code: Full PT/OT recs: Pending TOC recs: Pending   Please contact the on call pager after 5 pm and on weekends at [phone].  JAlexandria Lodge MD PGY-1 Internal Medicine Teaching Service Pager: [phone]/07/2021

## 2020-11-14 NOTE — Progress Notes (Signed)
[facility] Progress Note    Assessment/ Plan:   1.  Fever/Weakness: BCx drawn 4/11 - pending. Follow symptoms. Per primary 2.  ESRD: Continue HD per TTS schedule, no heparin 3.  Hypertension/volume: BP stable, no edema on exam. At EDW today. 4.  Anemia of ESRD: Hgb 7.3 - due for ESA, aranesp 19mg start 4/12 5.  Secondary Hyperparathyroidism/Metabolic bone disease: CorrCa ok, Phos hgih. Continue home meds. Restrict phos in diet. Restarting home phoslo 6.  Nutrition: Alb low - adding protein supplements/push protein 7.  Cirrhosis/hepatic encephalopathy: Continue home meds 8.  Thrombocytopenia: Truly pancytopenia, follow.  [facility] EDW 63.5kg, 2K/2Ca, TDC, no heparin (cath is locked w/ NaCitrate or tPA) - Hectoral 167m IV q HD - Mircera 7526mIv q 2 weeks (last given 3/29)  [facility]an [facility]D [facility]  Subjective:   Patient seen and examined on HD. No acute events. Afebrile here. Tolerating HD, UFG 1.5L. 62.1kg pre-weight   Objective:   BP (!) 151/69   Pulse 82   Temp 98.6 F (37 C) (Oral)   Resp 16   Ht 5' 5"  (1.651 m)   Wt 62.1 kg   SpO2 99%   BMI 22.78 kg/m   Intake/Output Summary (Last 24 hours) at 11/14/2020 0843 Last data filed at 11/13/2020 2200 Gross per 24 hour  Intake 120 ml  Output 8 ml  Net 112 ml   Weight change: -0.8 kg  Physical Exam: Gen:nad, nontoxic appearing CVS:s1s2, rrr Resp:cta bl, unlabored AbdWUG:QBVQt/nd Ext:no edema Neuro: awake, following commands [facility] CT Head Wo Contrast  Result Date: 11/13/2020 CLINICAL DATA:  Sepsis, head injury EXAM: CT HEAD WITHOUT CONTRAST TECHNIQUE: Contiguous axial images were obtained from the base of the skull through the vertex without intravenous contrast. COMPARISON:  07/04/2020, 11/08/2020 FINDINGS: Brain: Normal anatomic configuration. Parenchymal volume loss is commensurate with the patient's age. Mild  periventricular white matter changes are present likely reflecting the sequela of small vessel ischemia. Remote tiny pontine infarct again noted. No abnormal intra or extra-axial mass lesion or fluid collection. No abnormal mass effect or midline shift. No evidence of acute intracranial hemorrhage or infarct. Ventricular size is normal. Cerebellum unremarkable. Vascular: No asymmetric hyperdense vasculature at the skull base. Skull: Intact Sinuses/Orbits: Paranasal sinuses are clear. Orbits are unremarkable. Other: Mastoid air cells and middle ear cavities are clear. IMPRESSION: No acute intracranial hemorrhage or infarct. Stable senescent changes. Electronically Signed   By: AshFidela Salisbury   On: 11/13/2020 01:57   DG Chest Port 1 View  Result Date: 11/13/2020 CLINICAL DATA:  Weakness and falls. EXAM: PORTABLE CHEST 1 VIEW COMPARISON:  November 03, 2020 FINDINGS: There is stable right-sided venous catheter positioning. Diffuse, chronic appearing increased interstitial lung markings are noted. Mild linear atelectasis is noted within the right lung base. There is no evidence of a pleural effusion or pneumothorax. The cardiac silhouette is mildly enlarged. There is mild to moderate severity calcification of the aortic arch. Radiopaque surgical coils are seen overlying the medial aspect of the left upper quadrant. The visualized skeletal structures are unremarkable. IMPRESSION: 1. Chronic appearing increased interstitial lung markings with mild right basilar linear atelectasis. Electronically Signed   By: ThaVirgina NorfolkD.   On: 11/13/2020 01:44    Labs: BMET Recent Labs  Lab 11/08/20 1532 11/09/20 0222 11/13/20 0128 11/13/20 0500 11/14/20 0557  NA 137 136 134* 134* 132*  K 4.4 4.0 3.9 3.8 4.4  CL 98 98 97* 96* 94*  CO2 29 29 27 27 22   GLUCOSE 244* 237* 132* 106* 314*  BUN 27* 35* 35* 36* 52*  CREATININE 5.89* 6.64* 6.77* 7.10* 9.15*  CALCIUM 8.0* 8.1* 8.0* 7.8* 7.7*  PHOS  --   --   --   --   5.7*   CBC Recent Labs  Lab 11/08/20 1532 11/09/20 0222 11/13/20 0128 11/13/20 0500  WBC 3.3* 3.1* 3.2* 3.1*  NEUTROABS 1.4*  --  1.8  --   HGB 9.4* 8.9* 8.1* 7.3*  HCT 29.2* 27.2* 24.7* 22.6*  MCV 84.4 84.7 83.7 83.1  PLT 43* 48* 50* 50*    Medications:    . (feeding supplement) PROSource Plus  30 mL Oral BID BM  . Chlorhexidine Gluconate Cloth  6 each Topical Daily  . Darbepoetin Alfa      . darbepoetin (ARANESP) injection - DIALYSIS  100 mcg Intravenous Q Tue-HD  . insulin aspart  0-6 Units Subcutaneous TID WC  . insulin detemir  10 Units Subcutaneous QHS  . lactulose  30 g Oral TID  . rifaximin  550 mg Oral BID  . rosuvastatin  5 mg Oral Daily      Gean Quint, MD [facility] 11/14/2020, 8:43 AM

## 2020-11-14 NOTE — Procedures (Signed)
I was present at this dialysis session. I have reviewed the session itself and made appropriate changes.   Filed Weights   11/13/20 0135 11/13/20 0705 11/14/20 0650  Weight: 64.1 kg 63.3 kg 62.1 kg    Recent Labs  Lab 11/14/20 0557  NA 132*  K 4.4  CL 94*  CO2 22  GLUCOSE 314*  BUN 52*  CREATININE 9.15*  CALCIUM 7.7*  PHOS 5.7*    Recent Labs  Lab 11/08/20 1532 11/09/20 0222 11/13/20 0128 11/13/20 0500  WBC 3.3* 3.1* 3.2* 3.1*  NEUTROABS 1.4*  --  1.8  --   HGB 9.4* 8.9* 8.1* 7.3*  HCT 29.2* 27.2* 24.7* 22.6*  MCV 84.4 84.7 83.7 83.1  PLT 43* 48* 50* 50*    Scheduled Meds: . (feeding supplement) PROSource Plus  30 mL Oral BID BM  . Chlorhexidine Gluconate Cloth  6 each Topical Daily  . Darbepoetin Alfa      . darbepoetin (ARANESP) injection - DIALYSIS  100 mcg Intravenous Q Tue-HD  . insulin aspart  0-6 Units Subcutaneous TID WC  . insulin detemir  10 Units Subcutaneous QHS  . lactulose  30 g Oral TID  . rifaximin  550 mg Oral BID  . rosuvastatin  5 mg Oral Daily   Continuous Infusions: . sodium chloride    . sodium chloride    . anticoagulant sodium citrate     PRN Meds:.sodium chloride, sodium chloride, acetaminophen **OR** acetaminophen, alteplase, anticoagulant sodium citrate, ondansetron (ZOFRAN) IV   Gean Quint, MD [facility] 11/14/2020, 8:42 AM

## 2020-11-14 NOTE — Progress Notes (Signed)
   VASCULAR SURGERY ASSESSMENT & PLAN:   END-STAGE RENAL DISEASE: This patient underwent revision of his left arm fistula by Dr. Donzetta Matters on 11/02/2020.  An interposition 7 mm PTFE graft was placed.  He also had a tunneled dialysis catheter placed.  Apparently fistula was accessed prematurely.  I think the fistula is working fine I would simply not cannulate this for 4 weeks.  In the meantime he can use his catheter.  Vascular surgery will be available as needed.  SUBJECTIVE:   No complaints  PHYSICAL EXAM:   Vitals:   11/14/20 0930 11/14/20 1000 11/14/20 1027 11/14/20 1141  BP: (!) 128/54 (!) 128/51 (!) 130/53 (!) 129/52  Pulse: 85 78 80 86  Resp: 16 16 15 17   Temp:   98.7 F (37.1 C) 98 F (36.7 C)  TempSrc:   Oral   SpO2:   99% 99%  Weight:   60.2 kg   Height:       Good thrill in left upper arm fistula.  His incisions look fine. Moderate hematoma overlying graft.  LABS:   Lab Results  Component Value Date   WBC 2.6 (L) 11/14/2020   HGB 8.1 (L) 11/14/2020   HCT 24.9 (L) 11/14/2020   MCV 82.7 11/14/2020   PLT 42 (L) 11/14/2020   Lab Results  Component Value Date   CREATININE 9.15 (H) 11/14/2020   Lab Results  Component Value Date   INR 1.3 (H) 11/13/2020   CBG (last 3)  Recent Labs    11/13/20 2105 11/14/20 0632 11/14/20 1140  GLUCAP 183* 297* 117*    PROBLEM LIST:    Principal Problem:   FUO (fever of unknown origin) Active Problems:   Type II diabetes mellitus with renal manifestations ([place])   ESRD on dialysis ([place])   CURRENT MEDS:   . (feeding supplement) PROSource Plus  30 mL Oral BID BM  . calcium acetate  667 mg Oral TID WC  . Chlorhexidine Gluconate Cloth  6 each Topical Daily  . Darbepoetin Alfa      . darbepoetin (ARANESP) injection - DIALYSIS  100 mcg Intravenous Q Tue-HD  . insulin aspart  0-6 Units Subcutaneous TID WC  . insulin detemir  10 Units Subcutaneous QHS  . lactulose  30 g Oral TID  . rifaximin  550 mg Oral BID  .  rosuvastatin  5 mg Oral Daily    Deitra Mayo Office: [phone] 11/14/2020

## 2020-11-15 DIAGNOSIS — R509 Fever, unspecified: Secondary | ICD-10-CM | POA: Diagnosis not present

## 2020-11-15 LAB — RENAL FUNCTION PANEL
Albumin: 1.9 g/dL — ABNORMAL LOW (ref 3.5–5.0)
Anion gap: 12 (ref 5–15)
BUN: 29 mg/dL — ABNORMAL HIGH (ref 8–23)
CO2: 25 mmol/L (ref 22–32)
Calcium: 7.3 mg/dL — ABNORMAL LOW (ref 8.9–10.3)
Chloride: 98 mmol/L (ref 98–111)
Creatinine, Ser: 5.73 mg/dL — ABNORMAL HIGH (ref 0.61–1.24)
GFR, Estimated: 10 mL/min — ABNORMAL LOW (ref >60)
Glucose, Bld: 211 mg/dL — ABNORMAL HIGH (ref 70–99)
Phosphorus: 4.3 mg/dL (ref 2.5–4.6)
Potassium: 4.3 mmol/L (ref 3.5–5.1)
Sodium: 135 mmol/L (ref 135–145)

## 2020-11-15 LAB — CBC WITH DIFFERENTIAL/PLATELET
Abs Immature Granulocytes: 0.01 10*3/uL (ref 0.00–0.07)
Basophils Absolute: 0 10*3/uL (ref 0.0–0.1)
Basophils Relative: 0 %
Eosinophils Absolute: 0.1 10*3/uL (ref 0.0–0.5)
Eosinophils Relative: 2 %
HCT: 23.5 % — ABNORMAL LOW (ref 39.0–52.0)
Hemoglobin: 7.9 g/dL — ABNORMAL LOW (ref 13.0–17.0)
Immature Granulocytes: 0 %
Lymphocytes Relative: 38 %
Lymphs Abs: 1 10*3/uL (ref 0.7–4.0)
MCH: 27.1 pg (ref 26.0–34.0)
MCHC: 33.6 g/dL (ref 30.0–36.0)
MCV: 80.8 fL (ref 80.0–100.0)
Monocytes Absolute: 0.6 10*3/uL (ref 0.1–1.0)
Monocytes Relative: 25 %
Neutro Abs: 0.9 10*3/uL — ABNORMAL LOW (ref 1.7–7.7)
Neutrophils Relative %: 35 %
Platelets: 35 10*3/uL — ABNORMAL LOW (ref 150–400)
RBC: 2.91 MIL/uL — ABNORMAL LOW (ref 4.22–5.81)
RDW: 14.9 % (ref 11.5–15.5)
WBC: 2.5 10*3/uL — ABNORMAL LOW (ref 4.0–10.5)
nRBC: 0 % (ref 0.0–0.2)

## 2020-11-15 LAB — URINE CULTURE

## 2020-11-15 LAB — PATHOLOGIST SMEAR REVIEW

## 2020-11-15 LAB — AMMONIA: Ammonia: 96 umol/L — ABNORMAL HIGH (ref 9–35)

## 2020-11-15 LAB — GLUCOSE, CAPILLARY
Glucose-Capillary: 108 mg/dL — ABNORMAL HIGH (ref 70–99)
Glucose-Capillary: 220 mg/dL — ABNORMAL HIGH (ref 70–99)

## 2020-11-15 NOTE — Progress Notes (Addendum)
[facility] Progress Note   Subjective:  Seen in room - initially hiding under covers, but able to coax out for questioning. Son interprets. Denies CP or dyspnea. Son thinks his face looks a little edematous today.  Objective Vitals:   11/14/20 1141 11/14/20 1657 11/14/20 2002 11/15/20 0513  BP: (!) 129/52 (!) 144/57 (!) 142/57 (!) 130/50  Pulse: 86 88 84 71  Resp: 17 17 20 18   Temp: 98 F (36.7 C) 98.4 F (36.9 C) 98.6 F (37 C) 98.3 F (36.8 C)  TempSrc:   Oral Oral  SpO2: 99% 99% 100% 100%  Weight:   62 kg   Height:       Physical Exam General: Well appearing man, NAD. Room air. Heart: RRR; no murmur Lungs: CTA anteriorly Abdomen: soft, non-tender Extremities: No LE edema Dialysis Access: [facility]  Additional Objective Labs: Basic Metabolic Panel: Recent Labs  Lab 11/13/20 0500 11/14/20 0557 11/15/20 0232  NA 134* 132* 135  K 3.8 4.4 4.3  CL 96* 94* 98  CO2 27 22 25   GLUCOSE 106* 314* 211*  BUN 36* 52* 29*  CREATININE 7.10* 9.15* 5.73*  CALCIUM 7.8* 7.7* 7.3*  PHOS  --  5.7* 4.3   Liver Function Tests: Recent Labs  Lab 11/09/20 0222 11/13/20 0128 11/13/20 0500 11/14/20 0557 11/15/20 0232  AST 30 41 36  --   --   ALT 9 13 12   --   --   ALKPHOS 127* 122 112  --   --   BILITOT 2.1* 1.8* 1.6*  --   --   PROT 5.8* 6.0* 5.4*  --   --   ALBUMIN 2.2* 2.3* 2.1* 2.2* 1.9*   CBC: Recent Labs  Lab 11/09/20 0222 11/13/20 0128 11/13/20 0500 11/14/20 0557 11/15/20 0232  WBC 3.1* 3.2* 3.1* 2.6* 2.5*  NEUTROABS  --  1.8  --  1.0* 0.9*  HGB 8.9* 8.1* 7.3* 8.1* 7.9*  HCT 27.2* 24.7* 22.6* 24.9* 23.5*  MCV 84.7 83.7 83.1 82.7 80.8  PLT 48* 50* 50* 42* 35*   CBG: Recent Labs  Lab 11/14/20 0632 11/14/20 1140 11/14/20 1656 11/15/20 0036 11/15/20 0634  GLUCAP 297* 117* 264* 108* 220*   Iron Studies:  Recent Labs    11/14/20 0557  IRON 32*  TIBC 174*   Medications: . anticoagulant sodium citrate     . (feeding supplement)  PROSource Plus  30 mL Oral BID BM  . calcium acetate  667 mg Oral TID WC  . Chlorhexidine Gluconate Cloth  6 each Topical Daily  . darbepoetin (ARANESP) injection - DIALYSIS  100 mcg Intravenous Q Tue-HD  . insulin aspart  0-6 Units Subcutaneous TID WC  . insulin detemir  10 Units Subcutaneous QHS  . lactulose  30 g Oral TID  . rifaximin  550 mg Oral BID  . rosuvastatin  5 mg Oral Daily    Dialysis Orders: [facility]TS at [facility]hr, 400/500, EDW 63.5kg, 2K/2Ca, TDC, no heparin (cath is locked w/ NaCitrate or tPA) - Hectoral 79mg IV q HD - Mircera 774m Iv q 2 weeks (last given 3/29)  Assessment/Plan: 1.  Fever/Weakness: BCx 4/11 negative. Afebrile since admit. Follow symptoms. 2.  ESRD: Continue HD per TTS schedule - next 4/14. No heparin. 3.  Hypertension/volume: BP stable, no LE edema but son thinks he has some facial edema, will try to get off a little more fluid with next HD -- likely has lost weight with recurrent admits. 4.  Anemia of ESRD:  Hgb 7.9 - s/p Aranesp 161mg on 4/12, continue q Tues. 5.  Metabolic bone disease: CorrCa/Phos ok. Continue home meds. 6.  Nutrition: Alb low - continue protein supplements. 7.  Cirrhosis/hepatic encephalopathy: Ammonia 96 today, continue home meds. 8.  Thrombocytopenia: Truly pancytopenia, follow. 9.  New duodenal mass, ?neuroendocrine tumor[facility] 10.  T2DM   KVeneta Penton PA-C 11/15/2020, 9:24 AM  [facility]   I have personally seen and examined this patient and agree with the assessment/plan as outlined above by KStephania Fragmin PA-C. Anglia Blakley,MD 11/15/2020 10:21 AM

## 2020-11-15 NOTE — Discharge Summary (Addendum)
Name: William [name]: [id number] DOB: Jun 07, 1951 70 y.o. PCP: Horald Pollen, MD  Date of Admission: 11/13/2020  1:16 AM Date of Discharge: 11/15/2020 Attending Physician: Angelica Pou, MD  Discharge Diagnosis: 1. Fever (resolved) 2. Physical deconditioning 3. Duodenal mass 4. Liver cirrhosis status post TIPS 5. Chronic diarrhea 6. ESRD on HD 7. Pancytopenia 8. Moderate neutropenia  Discharge Medications: Allergies as of 11/15/2020      Reactions   Heparin Other (See Comments)   Patient is Muslim and is not permitted Pork derative due to religious belief)   Pork-derived Products Other (See Comments)   Patient does not eat pork due to religious beliefs      Medication List    STOP taking these medications   oxyCODONE-acetaminophen 5-325 MG tablet Commonly known as: Percocet     TAKE these medications   calcium acetate 667 MG capsule Commonly known as: PHOSLO Take 1 capsule (667 mg total) by mouth 3 (three) times daily with meals.   Insulin Pen Needle 31G X 5 MM Misc Commonly known as: Advocate Insulin Pen Needles Use four times daily to inject insulin   insulin regular 100 units/mL injection Commonly known as: NOVOLIN R Inject 6-8 Units into the skin See admin instructions. Inject 8 units subcutaneously with breakfast, and 6 units with lunch and supper   lactulose 10 GM/15ML solution Commonly known as: CHRONULAC Take 45 mLs (30 g total) by mouth 3 (three) times daily. What changed: when to take this   lidocaine-prilocaine cream Commonly known as: EMLA Apply 1 application topically See admin instructions. Apply topically prior to dialysis on Tuesday, Thursday, Saturday   MIRCERA IJ 1 Dose See admin instructions. Dialysis gives this medication   multivitamin with minerals Tabs tablet Take 1 tablet by mouth daily.   mupirocin ointment 2 % Commonly known as: BACTROBAN Apply 1 application topically daily.   ondansetron 4 MG disintegrating  tablet Commonly known as: Zofran ODT Take 1 tablet (4 mg total) by mouth 2 (two) times daily as needed for nausea or vomiting.   ONE TOUCH LANCETS Misc Use to test blood sugar three times daily   OneTouch Verio test strip Generic drug: glucose blood USE 1 STRIP TO CHECK GLUCOSE THREE TIMES DAILY. DX:E11.65   rifaximin 550 MG Tabs tablet Commonly known as: XIFAXAN Take 1 tablet (550 mg total) by mouth 2 (two) times daily.   rosuvastatin 5 MG tablet Commonly known as: CRESTOR Take 1 tablet (5 mg total) by mouth daily.   sevelamer carbonate 800 MG tablet Commonly known as: RENVELA Take 800 mg by mouth 3 (three) times daily.   [name] 100 UNIT/ML FlexTouch Pen Generic drug: insulin degludec Inject 14 Units into the skin daily. Inject 14 units under the skin once daily. NO FURTHER REFILLS UNTIL PATIENT IS SEEN IN THE OFFICE What changed:   how much to take  when to take this  additional instructions       Disposition and follow-up:   William [name] was discharged from [facility] in Stable condition.  At the hospital follow up visit please address:  Goals of care. Pt seems to have been declining more rapidly over the past few months. I do not get that sense that he and his son (caregiver) have a good grasp on his overall prognosis which I consider poor given his cirrhosis with a MELD score of 27 and ESRD, not to mention the duodenal mass.  -continue ongoing discussions regarding goals  of care  Chronic diarrhea. This mostly sounds like a chronic issue. It is complicated by him requiring lactulose in the setting of recurrent admissions for hepatic encephalopathy secondary to NAFLD liver cirrhosis. C.diff negative.  -continue to monitor  Physical deconditioning in the setting of liver cirrhosis and ESRD -ensure ongoing physical rehabilitation either through home health or outpatient -monitor for recurrent falls  Duodenal mass. Question if this  is a contributing source to fevers and diarrhea. -oncology visit 11/29/20 at [facility] -endoscopy scheduled for 12/30/20 with Dr. Dwyane Dee  Cirrhosis s/p TIPS. MELD Score 27=27-32% 90 day mortality. Child Pugh Class B. Son was somewhat resistant to lactulose use this hospitalization as he did not want his father to have diarrhea.  Reportedly must undergo re-evaluation for transplant after undergoing a toe amputation last month. Discharge medications: continue lactulose titrated to have 3 bowel movements daily, continue rifaxamin Follow up recommendations -office visit with transplant scheduled for 04/18/21. Would benefit from evaluation sooner if possible due to recurrent hepatic encephalopathy admissions -encourage lactulose use  Pancytopenia with moderate neutropenia ([place]ia 875).  -continue to monitor with CBC w/ diff at time of follow up  Labs / imaging needed at time of follow-up: CBC  Pending labs/ test needing follow-up: blood cultures  Follow-up Appointments:   - 11/23/20 Wound care  - 11/24/20 Vascular surgery - 11/29/20 Oncology - 12/20/20 Endocrinology - 04/18/21 [facility]  Hospital Course:  William [name] is a 70 year old chronically ill male with ESRD on HD, liver cirrhosis s/p TIPS (MELD score 27, Child Pugh Class B), duodenal mass, HFpEF, and type 2 diabetes who presented for his third hospital admission over a period of 11 days.   He presented to the ED on 11/13/20 after experiencing a fall which he attributed to weakness. Following discharge of his last hospitalization on 4/7, pt was reportedly at baseline. Around 2 days prior to presenting to the ED on 4/11, he reported having 5-6 watery bowel movements. The night prior to the admission, he developed a fever to 101F.   EMS was called and found his temperature to be 103F.  Lab workup in the ED did not show any significant lab changes in comparison to prior admission. CXR unchanged as well. CT head was obtained  due to him falling and fortunately did not show any bleed. Blood and urine cultures were obtained. Blood cultures remained without growth on day of discharge. Urine culture grew multiple species. C.diff was negative. He remained afebrile for the remainder of his hospitalization and diarrhea improved. The increased number of bowel movements may have been related to a viral gastroenteritis or the duodenal mass.   He has a long standing history of pancytopenia. I suspect that this is multifactorial including chronic disease, CKD, cirrhosis, and possibly malignancy (duodenal mass). His intermittent fevers may be neutropenic fevers. He will benefit from further evaluation with oncology at [facility] on 4/27.  He was evaluated by physical therapy for discharge safety and presenting weakness who recommended outpatient PT. He was also evaluated by OT who did not have any follow up recommendations. On speaking with our transitions of care team, it was noted that home health would not be covered under medicaid. He does not qualify for medicare despite being ESRD, supposedly, because he is not a [place] citizen or have a work history. He was recommended to follow up with outpatient PT for ongoing therapy.  He remains high risk for readmission given his multiple comorbidies and physical deconditioning.    ~~~~~~~~~~~~~~~~~~~~~~~~~~~~~~~~~~~~~~~~~~~~~~~~~~~~~~~~~~~~~~~~~~~~~~~~~~~~~~~~~  Day of Discharge Subjective:  No acute events overnight. During evaluation this morning, patient is sitting up in bed eating breakfast. Son at bedside. Patient reports he is feeling well this morning, has no complaints. Patient and son feel mentation is at baseline. Patient reports he had 3-4 episodes of diarrhea yesterday even without lactulose. Patient is unable to provide specific timeline for onset of diarrhea, however reports diarrhea did proceed lactulose use. Counseled patient and son regarding importance of taking lactulose. Discussed  that the small duodenal mass which the patient has an oncology appointment scheduled for may be contributing.   Discharge Exam:   BP (!) 153/62 (BP Location: Right Arm)   Pulse 73   Temp 98.3 F (36.8 C)   Resp 17   Ht 5' 5"  (1.651 m)   Wt 62 kg   SpO2 100%   BMI 22.75 kg/m  Constitutional: chronically-ill appearing man sitting up in bed, awake, alert, eating breakfast  HENT: normocephalic, soft tissue swelling on L temple slightly larger than presentation, no breakage of skin, mucous membranes moist Eyes: scleral icterus present Cardiovascular: No lower extremity edema. Pulmonary/Chest: normal work of breathing on room air Abdominal: soft, non-tender, non-distended MSK: Extremities are thin. L third toe amputated. Neurological: alert & oriented x 3   Pertinent Labs, Studies, and Procedures:  CBC Latest Ref Rng & Units 11/15/2020 11/14/2020 11/13/2020  WBC 4.0 - 10.5 K/uL 2.5(L) 2.6(L) 3.1(L)  Hemoglobin 13.0 - 17.0 g/dL 7.9(L) 8.1(L) 7.3(L)  Hematocrit 39.0 - 52.0 % 23.5(L) 24.9(L) 22.6(L)  Platelets 150 - 400 K/uL 35(L) 42(L) 50(L)   CMP Latest Ref Rng & Units 11/15/2020 11/14/2020 11/13/2020  Glucose 70 - 99 mg/dL 211(H) 314(H) 106(H)  BUN 8 - 23 mg/dL 29(H) 52(H) 36(H)  Creatinine 0.61 - 1.24 mg/dL 5.73(H) 9.15(H) 7.10(H)  Sodium 135 - 145 mmol/L 135 132(L) 134(L)  Potassium 3.5 - 5.1 mmol/L 4.3 4.4 3.8  Chloride 98 - 111 mmol/L 98 94(L) 96(L)  CO2 22 - 32 mmol/L 25 22 27   Calcium 8.9 - 10.3 mg/dL 7.3(L) 7.7(L) 7.8(L)  Total Protein 6.5 - 8.1 g/dL - - 5.4(L)  Total Bilirubin 0.3 - 1.2 mg/dL - - 1.6(H)  Alkaline Phos 38 - 126 U/L - - 112  AST 15 - 41 U/L - - 36  ALT 0 - 44 U/L - - 12   Recent Results (from the past 720 hour(s))  SARS CORONAVIRUS 2 (TAT 6-24 HRS) Nasopharyngeal Nasopharyngeal Swab     Status: None   Collection Time: 11/01/20 10:22 AM   Specimen: Nasopharyngeal Swab  Result Value Ref Range Status   SARS Coronavirus 2 NEGATIVE NEGATIVE Final    Comment:  (NOTE) SARS-CoV-2 target nucleic acids are NOT DETECTED.  The SARS-CoV-2 RNA is generally detectable in upper and lower respiratory specimens during the acute phase of infection. Negative results do not preclude SARS-CoV-2 infection, do not rule out co-infections with other pathogens, and should not be used as the sole basis for treatment or other patient management decisions. Negative results must be combined with clinical observations, patient history, and epidemiological information. The expected result is Negative.  Fact Sheet for Patients: SugarRoll.be  Fact Sheet for Healthcare Providers: https://www.woods-mathews.com/  This test is not yet approved or cleared by the [place] FDA and  has been authorized for detection and/or diagnosis of SARS-CoV-2 by FDA under an Emergency Use Authorization (EUA). This EUA will remain  in effect (meaning this test can be used) for the duration of the COVID-19 declaration  under Se ction 564(b)(1) of the Act, 21 U.S.C. section 360bbb-3(b)(1), unless the authorization is terminated or revoked sooner.  Performed at [facility], [place] [address] [place], [place] [address]   SARS CORONAVIRUS 2 (TAT 6-24 HRS) Nasopharyngeal Nasopharyngeal Swab     Status: None   Collection Time: 11/03/20  3:29 PM   Specimen: Nasopharyngeal Swab  Result Value Ref Range Status   SARS Coronavirus 2 NEGATIVE NEGATIVE Final    Comment: (NOTE) SARS-CoV-2 target nucleic acids are NOT DETECTED.  The SARS-CoV-2 RNA is generally detectable in upper and lower respiratory specimens during the acute phase of infection. Negative results do not preclude SARS-CoV-2 infection, do not rule out co-infections with other pathogens, and should not be used as the sole basis for treatment or other patient management decisions. Negative results must be combined with clinical observations, patient history, and epidemiological  information. The expected result is Negative.  Fact Sheet for Patients: SugarRoll.be  Fact Sheet for Healthcare Providers: https://www.woods-mathews.com/  This test is not yet approved or cleared by the [place] FDA and  has been authorized for detection and/or diagnosis of SARS-CoV-2 by FDA under an Emergency Use Authorization (EUA). This EUA will remain  in effect (meaning this test can be used) for the duration of the COVID-19 declaration under Se ction 564(b)(1) of the Act, 21 U.S.C. section 360bbb-3(b)(1), unless the authorization is terminated or revoked sooner.  Performed at [facility], [place] [address], [place], [place] [address]   Blood culture (routine x 2)     Status: None   Collection Time: 11/03/20  7:57 PM   Specimen: BLOOD  Result Value Ref Range Status   Specimen Description BLOOD RIGHT ANTECUBITAL  Final   Special Requests   Final    BOTTLES DRAWN AEROBIC AND ANAEROBIC Blood Culture results may not be optimal due to an inadequate volume of blood received in culture bottles   Culture   Final    NO GROWTH 5 DAYS Performed at [facility], [place] [address] [place], [place] [address]    Report Status 11/09/2020 FINAL  Final  Blood culture (routine x 2)     Status: None   Collection Time: 11/03/20 11:51 PM   Specimen: BLOOD RIGHT WRIST  Result Value Ref Range Status   Specimen Description BLOOD RIGHT WRIST  Final   Special Requests   Final    BOTTLES DRAWN AEROBIC AND ANAEROBIC Blood Culture results may not be optimal due to an inadequate volume of blood received in culture bottles   Culture   Final    NO GROWTH 5 DAYS Performed at [facility], [place] [address], [place], [place] [address]    Report Status 11/09/2020 FINAL  Final  Resp Panel by RT-PCR (Flu A&B, Covid) Nasopharyngeal Swab     Status: None   Collection Time: 11/08/20  5:52 PM   Specimen: Nasopharyngeal Swab; Nasopharyngeal(NP) swabs in  vial transport medium  Result Value Ref Range Status   SARS Coronavirus 2 by RT PCR NEGATIVE NEGATIVE Final    Comment: (NOTE) SARS-CoV-2 target nucleic acids are NOT DETECTED.  The SARS-CoV-2 RNA is generally detectable in upper respiratory specimens during the acute phase of infection. The lowest concentration of SARS-CoV-2 viral copies this assay can detect is 138 copies/mL. A negative result does not preclude SARS-Cov-2 infection and should not be used as the sole basis for treatment or other patient management decisions. A negative result may occur with  improper specimen collection/handling, submission of specimen other than nasopharyngeal swab, presence of viral mutation(s) within the areas targeted by this assay, and inadequate number of viral copies(<138 copies/mL). A negative result must be combined with clinical observations, patient history, and epidemiological information. The expected result is Negative.  Fact Sheet for Patients:  EntrepreneurPulse.com.au  Fact Sheet for Healthcare Providers:  IncredibleEmployment.be  This test is no t yet approved or cleared by the [place] FDA and  has been authorized for detection and/or diagnosis of SARS-CoV-2 by FDA under an Emergency Use Authorization (EUA). This EUA will remain  in effect (meaning this test can be used) for the duration of the COVID-19 declaration under Section 564(b)(1) of the Act, 21 U.S.C.section 360bbb-3(b)(1), unless the authorization is terminated  or revoked sooner.       Influenza A by PCR NEGATIVE NEGATIVE Final   Influenza B by PCR NEGATIVE NEGATIVE Final    Comment: (NOTE) The Xpert Xpress SARS-CoV-2/FLU/RSV plus assay is intended as an aid in the diagnosis of influenza from Nasopharyngeal swab specimens and should not be used as a sole basis for treatment. Nasal washings and aspirates are unacceptable for Xpert Xpress SARS-CoV-2/FLU/RSV testing.  Fact  Sheet for Patients: EntrepreneurPulse.com.au  Fact Sheet for Healthcare Providers: IncredibleEmployment.be  This test is not yet approved or cleared by the [place] FDA and has been authorized for detection and/or diagnosis of SARS-CoV-2 by FDA under an Emergency Use Authorization (EUA). This EUA will remain in effect (meaning this test can be used) for the duration of the COVID-19 declaration under Section 564(b)(1) of the Act, 21 U.S.C. section 360bbb-3(b)(1), unless the authorization is terminated or revoked.  Performed at [facility], [place] [address] [place], [place] [address]   Culture, blood (routine x 2)     Status: None   Collection Time: 11/09/20  2:00 AM   Specimen: BLOOD  Result Value Ref Range Status   Specimen Description BLOOD RIGHT HAND  Final   Special Requests   Final    BOTTLES DRAWN AEROBIC AND ANAEROBIC Blood Culture adequate volume   Culture   Final    NO GROWTH 5 DAYS Performed at [facility], [address], [place], [place] [address]    Report Status 11/14/2020 FINAL  Final  Culture, blood (routine x 2)     Status: None   Collection Time: 11/09/20  2:24 AM   Specimen: BLOOD  Result Value Ref Range Status   Specimen Description BLOOD RIGHT ARM  Final   Special Requests   Final    BOTTLES DRAWN AEROBIC ONLY Blood Culture results may not be optimal due to an excessive volume of blood received in culture bottles   Culture   Final    NO GROWTH 5 DAYS Performed at [facility], [place] [address] [place], [place] [address]    Report Status 11/14/2020 FINAL  Final  Urine culture     Status: Abnormal   Collection Time: 11/13/20  1:28 AM   Specimen: Urine, Random  Result Value Ref Range Status   Specimen Description URINE, RANDOM  Final   Special Requests   Final    NONE Performed at [facility], [place] [address] [place], [place] [address]    Culture MULTIPLE SPECIES PRESENT, SUGGEST  RECOLLECTION (A)  Final   Report Status 11/15/2020 FINAL  Final  Resp Panel by RT-PCR (Flu A&B, Covid) Nasopharyngeal Swab     Status: None   Collection Time:  11/13/20  2:22 AM   Specimen: Nasopharyngeal Swab; Nasopharyngeal(NP) swabs in vial transport medium  Result Value Ref Range Status   SARS Coronavirus 2 by RT PCR NEGATIVE NEGATIVE Final    Comment: (NOTE) SARS-CoV-2 target nucleic acids are NOT DETECTED.  The SARS-CoV-2 RNA is generally detectable in upper respiratory specimens during the acute phase of infection. The lowest concentration of SARS-CoV-2 viral copies this assay can detect is 138 copies/mL. A negative result does not preclude SARS-Cov-2 infection and should not be used as the sole basis for treatment or other patient management decisions. A negative result may occur with  improper specimen collection/handling, submission of specimen other than nasopharyngeal swab, presence of viral mutation(s) within the areas targeted by this assay, and inadequate number of viral copies(<138 copies/mL). A negative result must be combined with clinical observations, patient history, and epidemiological information. The expected result is Negative.  Fact Sheet for Patients:  EntrepreneurPulse.com.au  Fact Sheet for Healthcare Providers:  IncredibleEmployment.be  This test is no t yet approved or cleared by the [place] FDA and  has been authorized for detection and/or diagnosis of SARS-CoV-2 by FDA under an Emergency Use Authorization (EUA). This EUA will remain  in effect (meaning this test can be used) for the duration of the COVID-19 declaration under Section 564(b)(1) of the Act, 21 U.S.C.section 360bbb-3(b)(1), unless the authorization is terminated  or revoked sooner.       Influenza A by PCR NEGATIVE NEGATIVE Final   Influenza B by PCR NEGATIVE NEGATIVE Final    Comment: (NOTE) The Xpert Xpress SARS-CoV-2/FLU/RSV plus assay  is intended as an aid in the diagnosis of influenza from Nasopharyngeal swab specimens and should not be used as a sole basis for treatment. Nasal washings and aspirates are unacceptable for Xpert Xpress SARS-CoV-2/FLU/RSV testing.  Fact Sheet for Patients: EntrepreneurPulse.com.au  Fact Sheet for Healthcare Providers: IncredibleEmployment.be  This test is not yet approved or cleared by the [place] FDA and has been authorized for detection and/or diagnosis of SARS-CoV-2 by FDA under an Emergency Use Authorization (EUA). This EUA will remain in effect (meaning this test can be used) for the duration of the COVID-19 declaration under Section 564(b)(1) of the Act, 21 U.S.C. section 360bbb-3(b)(1), unless the authorization is terminated or revoked.  Performed at [facility], [place] [address], [place], [place] [address]   Blood culture (routine x 2)     Status: None (Preliminary result)   Collection Time: 11/13/20  2:39 AM   Specimen: BLOOD  Result Value Ref Range Status   Specimen Description BLOOD SITE NOT SPECIFIED  Final   Special Requests   Final    BOTTLES DRAWN AEROBIC AND ANAEROBIC Blood Culture results may not be optimal due to an inadequate volume of blood received in culture bottles   Culture   Final    NO GROWTH 2 DAYS Performed at [facility], [place] [address] [place], [place] [address]    Report Status PENDING  Incomplete  Blood culture (routine x 2)     Status: None (Preliminary result)   Collection Time: 11/13/20  4:50 AM   Specimen: BLOOD  Result Value Ref Range Status   Specimen Description BLOOD SITE NOT SPECIFIED  Final   Special Requests   Final    BOTTLES DRAWN AEROBIC AND ANAEROBIC Blood Culture results may not be optimal due to an inadequate volume of blood received in culture bottles   Culture   Final    NO  GROWTH 2 DAYS Performed at [facility], [place] [address], [place], [place] [address]     Report Status PENDING  Incomplete  C Difficile Quick Screen w PCR reflex     Status: None   Collection Time: 11/13/20  6:06 AM   Specimen: STOOL  Result Value Ref Range Status   C Diff antigen NEGATIVE NEGATIVE Final   C Diff toxin NEGATIVE NEGATIVE Final   C Diff interpretation No C. difficile detected.  Final    Comment: Performed at [facility], [place] [address], [place], [place] [address]    CT Head Wo Contrast  Result Date: 11/13/2020 CLINICAL DATA:  Sepsis, head injury EXAM: CT HEAD WITHOUT CONTRAST TECHNIQUE: Contiguous axial images were obtained from the base of the skull through the vertex without intravenous contrast. COMPARISON:  07/04/2020, 11/08/2020 FINDINGS: Brain: Normal anatomic configuration. Parenchymal volume loss is commensurate with the patient's age. Mild periventricular white matter changes are present likely reflecting the sequela of small vessel ischemia. Remote tiny pontine infarct again noted. No abnormal intra or extra-axial mass lesion or fluid collection. No abnormal mass effect or midline shift. No evidence of acute intracranial hemorrhage or infarct. Ventricular size is normal. Cerebellum unremarkable. Vascular: No asymmetric hyperdense vasculature at the skull base. Skull: Intact Sinuses/Orbits: Paranasal sinuses are clear. Orbits are unremarkable. Other: Mastoid air cells and middle ear cavities are clear. IMPRESSION: No acute intracranial hemorrhage or infarct. Stable senescent changes. Electronically Signed   By: Fidela Salisbury MD   On: 11/13/2020 01:57   DG Chest Port 1 View  Result Date: 11/13/2020 CLINICAL DATA:  Weakness and falls. EXAM: PORTABLE CHEST 1 VIEW COMPARISON:  November 03, 2020 FINDINGS: There is stable right-sided venous catheter positioning. Diffuse, chronic appearing increased interstitial lung markings are noted. Mild linear atelectasis is noted within the right lung base. There is no evidence of a pleural effusion or pneumothorax. The  cardiac silhouette is mildly enlarged. There is mild to moderate severity calcification of the aortic arch. Radiopaque surgical coils are seen overlying the medial aspect of the left upper quadrant. The visualized skeletal structures are unremarkable. IMPRESSION: 1. Chronic appearing increased interstitial lung markings with mild right basilar linear atelectasis. Electronically Signed   By: Virgina Norfolk M.D.   On: 11/13/2020 01:44    Signed:  Mitzi Hansen, MD Internal Medicine Resident PGY-2 [facility] Pager: [phone] 11/15/2020 9:15 AM

## 2020-11-15 NOTE — TOC Initial Note (Addendum)
Transition of Care [facility]) - Initial/Assessment Note    Patient Details  Name: William [name] MRN: [id number] Date of Birth: Jan 25, 1951  Transition [facility]) CM/SW Contact:    Ninfa Meeker, RN Phone Number: 11/15/2020, 11:39 AM  Clinical Narrative:   Case manager is waiting for return call from [facility] Liaison. Left messages from 9am, called office at 11am and spoke with Kathlee Nations. Provided information requesting patient receive [facility] Kathlee Nations states she will submit information and contact me with acceptance or denial. MD notified of same.   1607: CM received call from Amy with [facility], they will accept patient for Home Health PT and RN for wound care. She will contact patient's son to update him and she will contact patient's primary care MD for orders. CM confirmed contact number for patient's son: Gaurav Baldree [phone]. CM also updated Dr. Shon Baton.   Expected Discharge Plan: [place] Barriers to Discharge: No [place] will accept this patient   Patient Goals and CMS Choice        Expected Discharge Plan and Services Expected Discharge Plan: [place]   Discharge Planning Services: CM Consult   Living arrangements for the past 2 months: Apartment Expected Discharge Date: 11/15/20               DME Arranged: N/A         HH Arranged: PT HH Agency: [facility] Date [place]: 11/15/20 Time HH Agency Contacted: 0900 Representative spoke with at [place]: Benjamine Mola @  11:15am.  Prior Living Arrangements/Services Living arrangements for the past 2 months: Apartment Lives with:: Adult Children                   Activities of Daily Living Home Assistive Devices/Equipment: None ADL Screening (condition at time of admission) Patient's cognitive ability adequate to safely complete daily activities?: No Is the patient deaf or have difficulty hearing?: No Does the patient have  difficulty seeing, even when wearing glasses/contacts?: Yes (difficulty seeing with [place]) Does the patient have difficulty concentrating, remembering, or making decisions?: No Patient able to express need for assistance with ADLs?: Yes Does the patient have difficulty dressing or bathing?: No Independently performs ADLs?: Yes (appropriate for developmental age) Does the patient have difficulty walking or climbing stairs?: No Weakness of Legs: None Weakness of Arms/Hands: None  Permission Sought/Granted                  Emotional Assessment              Admission diagnosis:  FUO (fever of unknown origin) [R50.9] Generalized weakness [R53.1] Frequent falls [R29.6] Increased ammonia level [R79.89] Patient Active Problem List   Diagnosis Date Noted  . FUO (fever of unknown origin) 11/13/2020  . Hepatic encephalopathy ([place]) 11/08/2020  . AMS (altered mental status) 11/03/2020  . Diabetic foot infection ([place]) 10/06/2020  . Gangrene of toe of left foot ([place])   . Goals of care, counseling/discussion   . Palliative care by specialist   . AV fistula thrombosis, initial encounter ([place]) 06/01/2019  . AV fistula occlusion, initial encounter ([place])   . Chronic systolic CHF (congestive heart failure) ([place])   . Esophageal varices ([place]) 09/23/2018  . Thrombocytopenia ([place]) 09/23/2018  . CHF (congestive heart failure) ([place]) 09/22/2018  . ESRD on dialysis ([place]) 02/06/2018  . CKD (chronic kidney disease) stage 4, GFR 15-29 ml/min (HCC) 12/19/2017  . Cirrhosis of liver with  ascites ([place]) 12/09/2017  . Goodpasture's syndrome ([place]) with associated hemoptysis 11/20/2017  . Pancytopenia ([place]) 11/06/2017  . Essential hypertension 11/06/2017  . Type II diabetes mellitus with renal manifestations ([place]) 08/25/2015   PCP:  Horald Pollen, MD Pharmacy:   [facility], [address] [place] [address] Phone: [phone]  Fax: [phone]     Social Determinants of Health (SDOH) Interventions    Readmission Risk Interventions Readmission Risk Prevention Plan 05/11/2020  Transportation Screening Complete  HRI or [place] Complete  Social Work Consult for [place] Planning/Counseling Complete  Palliative Care Screening Not Applicable  Medication Review Press photographer) Complete  Some recent data might be hidden

## 2020-11-15 NOTE — Progress Notes (Signed)
DISCHARGE NOTE HOME Moksh M Salmons to be discharged Home per MD order. Discussed prescriptions and follow up appointments with the patient. Prescriptions given to patient; medication list explained in detail. Patient verbalized understanding.  Skin clean, dry and intact without evidence of skin break down, no evidence of skin tears noted. IV catheter discontinued intact. Site without signs and symptoms of complications. Dressing and pressure applied. Pt denies pain at the site currently. No complaints noted.  Patient free of lines, drains, and wounds.   An After Visit Summary (AVS) was printed and given to the patient. Patient escorted via wheelchair, and discharged home via private auto.  Berneta Levins, RN

## 2020-11-17 DIAGNOSIS — I120 Hypertensive chronic kidney disease with stage 5 chronic kidney disease or end stage renal disease: Secondary | ICD-10-CM | POA: Diagnosis not present

## 2020-11-17 DIAGNOSIS — E1122 Type 2 diabetes mellitus with diabetic chronic kidney disease: Secondary | ICD-10-CM | POA: Diagnosis not present

## 2020-11-17 DIAGNOSIS — K7581 Nonalcoholic steatohepatitis (NASH): Secondary | ICD-10-CM | POA: Diagnosis not present

## 2020-11-17 DIAGNOSIS — K7290 Hepatic failure, unspecified without coma: Secondary | ICD-10-CM | POA: Diagnosis not present

## 2020-11-17 DIAGNOSIS — Z992 Dependence on renal dialysis: Secondary | ICD-10-CM | POA: Diagnosis not present

## 2020-11-17 DIAGNOSIS — N186 End stage renal disease: Secondary | ICD-10-CM | POA: Diagnosis not present

## 2020-11-17 DIAGNOSIS — M6281 Muscle weakness (generalized): Secondary | ICD-10-CM | POA: Diagnosis not present

## 2020-11-17 DIAGNOSIS — M310 Hypersensitivity angiitis: Secondary | ICD-10-CM | POA: Diagnosis not present

## 2020-11-17 DIAGNOSIS — Z794 Long term (current) use of insulin: Secondary | ICD-10-CM | POA: Diagnosis not present

## 2020-11-17 NOTE — Telephone Encounter (Signed)
Opened in error

## 2020-11-18 LAB — CULTURE, BLOOD (ROUTINE X 2)
Culture: NO GROWTH
Culture: NO GROWTH

## 2020-11-23 DIAGNOSIS — E1129 Type 2 diabetes mellitus with other diabetic kidney complication: Secondary | ICD-10-CM | POA: Diagnosis not present

## 2020-11-28 ENCOUNTER — Ambulatory Visit (INDEPENDENT_AMBULATORY_CARE_PROVIDER_SITE_OTHER): Payer: Medicare Other | Admitting: Emergency Medicine

## 2020-11-28 ENCOUNTER — Encounter: Payer: Self-pay | Admitting: Emergency Medicine

## 2020-11-28 ENCOUNTER — Other Ambulatory Visit: Payer: Self-pay

## 2020-11-28 VITALS — BP 162/68 | HR 72 | Temp 100.0°F | Ht 65.0 in | Wt 136.7 lb

## 2020-11-28 DIAGNOSIS — K7460 Unspecified cirrhosis of liver: Secondary | ICD-10-CM | POA: Diagnosis not present

## 2020-11-28 DIAGNOSIS — E7220 Disorder of urea cycle metabolism, unspecified: Secondary | ICD-10-CM | POA: Diagnosis not present

## 2020-11-28 DIAGNOSIS — K3189 Other diseases of stomach and duodenum: Secondary | ICD-10-CM

## 2020-11-28 DIAGNOSIS — M310 Hypersensitivity angiitis: Secondary | ICD-10-CM | POA: Diagnosis not present

## 2020-11-28 DIAGNOSIS — I5032 Chronic diastolic (congestive) heart failure: Secondary | ICD-10-CM | POA: Diagnosis not present

## 2020-11-28 DIAGNOSIS — R5381 Other malaise: Secondary | ICD-10-CM

## 2020-11-28 DIAGNOSIS — R531 Weakness: Secondary | ICD-10-CM | POA: Diagnosis not present

## 2020-11-28 DIAGNOSIS — D696 Thrombocytopenia, unspecified: Secondary | ICD-10-CM | POA: Diagnosis not present

## 2020-11-28 DIAGNOSIS — N186 End stage renal disease: Secondary | ICD-10-CM

## 2020-11-28 DIAGNOSIS — Z992 Dependence on renal dialysis: Secondary | ICD-10-CM

## 2020-11-28 DIAGNOSIS — Z09 Encounter for follow-up examination after completed treatment for conditions other than malignant neoplasm: Secondary | ICD-10-CM | POA: Diagnosis not present

## 2020-11-28 DIAGNOSIS — R188 Other ascites: Secondary | ICD-10-CM

## 2020-11-28 DIAGNOSIS — Z794 Long term (current) use of insulin: Secondary | ICD-10-CM

## 2020-11-28 DIAGNOSIS — E1121 Type 2 diabetes mellitus with diabetic nephropathy: Secondary | ICD-10-CM

## 2020-11-28 DIAGNOSIS — D61818 Other pancytopenia: Secondary | ICD-10-CM | POA: Diagnosis not present

## 2020-11-28 NOTE — Patient Instructions (Signed)
Cirrhosis  Cirrhosis is long-term (chronic) liver injury. The liver is the body's largest internal organ, and it performs many functions. It converts food into energy, removes toxic material from the blood, makes important proteins, and absorbs necessary vitamins from food. In cirrhosis, healthy liver cells are replaced by scar tissue. This prevents blood from flowing through the liver and makes it difficult for the liver to complete its functions. What are the causes? Common causes of this condition are hepatitis C and long-term alcohol abuse. Other causes include:  Nonalcoholic fatty liver disease (NAFLD). This happens when fat is deposited in the liver by causes other than alcohol.  Hepatitis B infection.  Autoimmune hepatitis. In this condition, the body's defense system (immune system) mistakenly attacks the liver cells, causing inflammation.  Diseases that cause blockage of ducts inside the liver.  Inherited liver diseases, such as hemochromatosis. This is one of the most common inherited liver diseases. In this disease, deposits of iron collect in the liver and other organs.  Reactions to certain long-term medicines, such as amiodarone, a heart medicine.  Parasitic infections. These include schistosomiasis, which is caused by a flatworm.  Long-term contact to certain toxins. These toxins include certain organic solvents, such as toluene and chloroform. What increases the risk? You are more likely to develop this condition if:  You have certain types of viral hepatitis.  You abuse alcohol, especially if you are male.  You are overweight.  You use IV drugs and share needles.  You have unprotected sex with someone who has viral hepatitis. What are the signs or symptoms? You may not have any signs and symptoms at first. Symptoms may not develop until the damage to your liver starts to get worse. Early symptoms may include:  Weakness and tiredness (fatigue).  Changes in  sleep patterns or having trouble sleeping.  Itchiness.  Tenderness in the right-upper part of your abdomen.  Weight loss and muscle loss.  Nausea.  Loss of appetite. Later symptoms may include:  Fatigue or weakness that is getting worse.  Yellow skin and eyes (jaundice).  Buildup of fluid in the abdomen (ascites). You may notice that your clothes are tight around your waist.  Weight gain and swelling of the feet and ankles (edema).  Trouble breathing.  Easy bruising and bleeding.  Vomiting blood, or black or bloody stool.  Mental confusion. How is this diagnosed? Your health care provider may suspect cirrhosis based on your symptoms and medical history, especially if you have other medical conditions or a history of alcohol abuse. Your health care provider will do a physical exam to feel your liver and to check for signs of cirrhosis. Tests may include:  Blood tests to check: ? For hepatitis B or C. ? Kidney function. ? Liver function.  Imaging tests such as: ? MRI or CT scan to look for changes seen in advanced cirrhosis. ? Ultrasound to see if normal liver tissue is being replaced by scar tissue.  A procedure in which a long needle is used to take a sample of liver tissue to be checked in a lab (biopsy). Liver biopsy can confirm the diagnosis of cirrhosis. How is this treated? Treatment for this condition depends on how damaged your liver is and what caused the damage. It may include treating the symptoms of cirrhosis, or treating the underlying causes to slow the damage. Treatment may include:  Making lifestyle changes, such as: ? Eating a healthy diet. You may need to work with your health care  provider or a dietitian to develop an eating plan. ? Restricting salt intake. ? Maintaining a healthy weight. ? Not abusing drugs or alcohol.  Taking medicines to: ? Treat liver infections or other infections. ? Control itching. ? Reduce fluid buildup. ? Reduce certain  blood toxins. ? Reduce risk of bleeding from enlarged blood vessels in the stomach or esophagus (varices).  Liver transplant. In this procedure, a liver from a donor is used to replace your diseased liver. This is done if cirrhosis has caused liver failure. Other treatments and procedures may be done depending on the problems that you get from cirrhosis. Common problems include liver-related kidney failure (hepatorenal syndrome). Follow these instructions at home:  Take medicines only as told by your health care provider. Do not use medicines that are toxic to your liver. Ask your health care provider before taking any new medicines, including over-the-counter medicines such as NSAIDs.  Rest as needed.  Eat a well-balanced diet.  Limit your salt or water intake, if your health care provider asks you to do this.  Do not drink alcohol. This is especially important if you routinely take acetaminophen.  Keep all follow-up visits. This is important.   Contact a health care provider if you:  Have fatigue or weakness that is getting worse.  Develop swelling of the hands, feet, or legs, or a buildup of fluid in the abdomen (ascites).  Have a fever or chills.  Develop loss of appetite.  Have nausea or vomiting.  Develop jaundice.  Develop easy bruising or bleeding. Get help right away if you:  Vomit bright red blood or a material that looks like coffee grounds.  Have blood in your stools.  Notice that your stools appear black and tarry.  Become confused.  Have chest pain or trouble breathing. These symptoms may represent a serious problem that is an emergency. Do not wait to see if the symptoms will go away. Get medical help right away. Call your local emergency services (911 in the [place] Do not drive yourself to the hospital. Summary  Cirrhosis is chronic liver injury. Common causes are hepatitis C and long-term alcohol abuse.  Tests used to diagnose cirrhosis include blood  tests, imaging tests, and liver biopsy.  Treatment for this condition involves treating the underlying cause. Avoid alcohol, drugs, salt, and medicines that may damage your liver.  Get help right away if you vomit bright red blood or a material that looks like coffee grounds. This information is not intended to replace advice given to you by your health care provider. Make sure you discuss any questions you have with your health care provider. Document Revised: 05/04/2020 Document Reviewed: 05/04/2020 Elsevier Patient Education  [place].

## 2020-11-28 NOTE — Progress Notes (Signed)
William [name] 70 y.o.   Chief Complaint  Patient presents with  . Hospitalization Follow-up    Pt states he feels dizzy and temp 100    HISTORY OF PRESENT ILLNESS: This is a 70 y.o. male with history of liver cirrhosis and end-stage renal disease on dialysis here for follow-up of recent hospitalization. Stable and compliant with medications.  Here with son who is translating and helping with medical history taking.  Discharge summary reviewed and medication list reviewed with son. Hospital Course:   William [name] is a 70 year old chronically ill male with ESRD on HD, liver cirrhosis s/p TIPS (MELD score 27, Child Pugh Class B), duodenal mass, HFpEF, and type 2 diabetes who presented for his third hospital admission over a period of 11 days.    He presented to the ED on 11/13/20 after experiencing a fall which he attributed to weakness. Following discharge of his last hospitalization on 4/7, pt was reportedly at baseline. Around 2 days prior to presenting to the ED on 4/11, he reported having 5-6 watery bowel movements. The night prior to the admission, he developed a fever to 101F.    EMS was called and found his temperature to be 103F.  Lab workup in the ED did not show any significant lab changes in comparison to prior admission. CXR unchanged as well. CT head was obtained due to him falling and fortunately did not show any bleed. Blood and urine cultures were obtained. Blood cultures remained without growth on day of discharge. Urine culture grew multiple species. C.diff was negative. He remained afebrile for the remainder of his hospitalization and diarrhea improved. The increased number of bowel movements may have been related to a viral gastroenteritis or the duodenal mass.    He has a long standing history of pancytopenia. I suspect that this is multifactorial including chronic disease, CKD, cirrhosis, and possibly malignancy (duodenal mass). His intermittent fevers may be  neutropenic fevers. He will benefit from further evaluation with oncology at [facility] on 4/27.   He was evaluated by physical therapy for discharge safety and presenting weakness who recommended outpatient PT. He was also evaluated by OT who did not have any follow up recommendations. On speaking with our transitions of care team, it was noted that home health would not be covered under medicaid. He does not qualify for medicare despite being ESRD, supposedly, because he is not a [place] citizen or have a work history. He was recommended to follow up with outpatient PT for ongoing therapy.   He remains high risk for readmission given his multiple comorbidies and physical deconditioning.     Medication List     STOP taking these medications   oxyCODONE-acetaminophen 5-325 MG tablet Commonly known as: Percocet       TAKE these medications   calcium acetate 667 MG capsule Commonly known as: PHOSLO Take 1 capsule (667 mg total) by mouth 3 (three) times daily with meals.    Insulin Pen Needle 31G X 5 MM Misc Commonly known as: Advocate Insulin Pen Needles Use four times daily to inject insulin    insulin regular 100 units/mL injection Commonly known as: NOVOLIN R Inject 6-8 Units into the skin See admin instructions. Inject 8 units subcutaneously with breakfast, and 6 units with lunch and supper    lactulose 10 GM/15ML solution Commonly known as: CHRONULAC Take 45 mLs (30 g total) by mouth 3 (three) times daily. What changed: when to take this    lidocaine-prilocaine  cream Commonly known as: EMLA Apply 1 application topically See admin instructions. Apply topically prior to dialysis on Tuesday, Thursday, Saturday    MIRCERA IJ 1 Dose See admin instructions. Dialysis gives this medication    multivitamin with minerals Tabs tablet Take 1 tablet by mouth daily.    mupirocin ointment 2 % Commonly known as: BACTROBAN Apply 1 application topically daily.    ondansetron 4 MG disintegrating  tablet Commonly known as: Zofran ODT Take 1 tablet (4 mg total) by mouth 2 (two) times daily as needed for nausea or vomiting.    ONE TOUCH LANCETS Misc Use to test blood sugar three times daily    OneTouch Verio test strip Generic drug: glucose blood USE 1 STRIP TO CHECK GLUCOSE THREE TIMES DAILY. DX:E11.65    rifaximin 550 MG Tabs tablet Commonly known as: XIFAXAN Take 1 tablet (550 mg total) by mouth 2 (two) times daily.    rosuvastatin 5 MG tablet Commonly known as: CRESTOR Take 1 tablet (5 mg total) by mouth daily.    sevelamer carbonate 800 MG tablet Commonly known as: RENVELA Take 800 mg by mouth 3 (three) times daily.    [name] 100 UNIT/ML FlexTouch Pen Generic drug: insulin degludec Inject 14 Units into the skin daily. Inject 14 units under the skin once daily. NO FURTHER REFILLS UNTIL PATIENT IS SEEN IN THE OFFICE What changed:   how much to take  when to take this  additional instructions        Name: William [name] MRN: [id number] DOB: Feb 10, 1951 70 y.o. PCP: Horald Pollen, MD   Date of Admission: 11/13/2020  1:16 AM Date of Discharge: 11/15/2020 Attending Physician: Angelica Pou, MD   Discharge Diagnosis: 1. Fever (resolved) 2. Physical deconditioning 3. Duodenal mass 4. Liver cirrhosis status post TIPS 5. Chronic diarrhea 6. ESRD on HD 7. Pancytopenia 8. Moderate neutropenia  HPI   Prior to Admission medications   Medication Sig Start Date End Date Taking? Authorizing Provider  calcium acetate (PHOSLO) 667 MG capsule Take 1 capsule (667 mg total) by mouth 3 (three) times daily with meals. 05/11/20  Yes Alekh, Kshitiz, MD  glucose blood (ONETOUCH VERIO) test strip USE 1 STRIP TO CHECK GLUCOSE THREE TIMES DAILY. DX:E11.65 10/18/20  Yes Elayne Snare, MD  insulin degludec (TRESIBA FLEXTOUCH) 100 UNIT/ML FlexTouch Pen Inject 14 Units into the skin daily. Inject 14 units under the skin once daily. NO FURTHER REFILLS UNTIL  PATIENT IS SEEN IN THE OFFICE Patient taking differently: Inject 12 Units into the skin daily before breakfast. 09/04/20  Yes Elayne Snare, MD  Insulin Pen Needle (ADVOCATE INSULIN PEN NEEDLES) 31G X 5 MM MISC Use four times daily to inject insulin 04/27/18  Yes Elayne Snare, MD  insulin regular (NOVOLIN R,HUMULIN R) 100 units/mL injection Inject 6-8 Units into the skin See admin instructions. Inject 8 units subcutaneously with breakfast, and 6 units with lunch and supper   Yes [provider]  lactulose (CHRONULAC) 10 GM/15ML solution Take 45 mLs (30 g total) by mouth 3 (three) times daily. Patient taking differently: Take 30 g by mouth daily. 05/11/20  Yes Aline August, MD  lidocaine-prilocaine (EMLA) cream Apply 1 application topically See admin instructions. Apply topically prior to dialysis on Tuesday, Thursday, Saturday 08/24/18  Yes [provider]  Methoxy PEG-Epoetin Beta (MIRCERA IJ) 1 Dose See admin instructions. Dialysis gives this medication 05/09/20 05/08/21 Yes [provider]  Multiple Vitamin (MULTIVITAMIN WITH MINERALS) TABS tablet Take 1 tablet  by mouth daily.   Yes [provider]  mupirocin ointment (BACTROBAN) 2 % Apply 1 application topically daily. 10/26/20  Yes McDonald, Adam R, DPM  ondansetron (ZOFRAN ODT) 4 MG disintegrating tablet Take 1 tablet (4 mg total) by mouth 2 (two) times daily as needed for nausea or vomiting. 05/01/20  Yes [name], [place] C, PA-C  ONE TOUCH LANCETS MISC Use to test blood sugar three times daily 04/27/18  Yes Elayne Snare, MD  rifaximin (XIFAXAN) 550 MG TABS tablet Take 1 tablet (550 mg total) by mouth 2 (two) times daily. 07/07/20  Yes Shawna Clamp, MD  rosuvastatin (CRESTOR) 5 MG tablet Take 1 tablet (5 mg total) by mouth daily. 10/18/20  Yes Elayne Snare, MD  sevelamer carbonate (RENVELA) 800 MG tablet Take 800 mg by mouth 3 (three) times daily. 09/25/20  Yes [provider]    Allergies  Allergen Reactions   . Heparin Other (See Comments)    Patient is Muslim and is not permitted Pork derative due to religious belief)  . Pork-Derived Products Other (See Comments)    Patient does not eat pork due to religious beliefs    Patient Active Problem List   Diagnosis Date Noted  . FUO (fever of unknown origin) 11/13/2020  . Hepatic encephalopathy ([place]) 11/08/2020  . AMS (altered mental status) 11/03/2020  . Diabetic foot infection ([place]) 10/06/2020  . Gangrene of toe of left foot ([place])   . Goals of care, counseling/discussion   . Palliative care by specialist   . AV fistula thrombosis, initial encounter ([place]) 06/01/2019  . AV fistula occlusion, initial encounter ([place])   . Chronic systolic CHF (congestive heart failure) ([place])   . Esophageal varices ([place]) 09/23/2018  . Thrombocytopenia ([place]) 09/23/2018  . CHF (congestive heart failure) ([place]) 09/22/2018  . ESRD on dialysis ([place]) 02/06/2018  . CKD (chronic kidney disease) stage 4, GFR 15-29 ml/min (HCC) 12/19/2017  . Cirrhosis of liver with ascites ([place]) 12/09/2017  . Goodpasture's syndrome ([place]) with associated hemoptysis 11/20/2017  . Pancytopenia ([place]) 11/06/2017  . Essential hypertension 11/06/2017  . Type II diabetes mellitus with renal manifestations ([place]) 08/25/2015    Past Medical History:  Diagnosis Date  . Anemia   . Blind one eye   . CHF (congestive heart failure) ([place])   . Cirrhosis ([place])   . ESRD on hemodialysis ([place])   . Goodpasture's disease [facility])    on outpatient plasmapheresis/notes 11/20/2017  . Grade I diastolic dysfunction 65/46/5035  . History of anemia due to chronic kidney disease   . History of blood transfusion X 1   "UGIB; low blood count"  . History of plasmapheresis    "qod" (11/20/2017)  . Hypertension   . Pancytopenia ([place]) 08/20/2017  . Type II diabetes mellitus ([place])     Past Surgical History:  Procedure Laterality Date  . A/V FISTULAGRAM Left 10/16/2020   Procedure: A/V FISTULAGRAM;  Surgeon: Waynetta Sandy, MD;  Location: [facility];  Service: Cardiovascular;  Laterality: Left;  . AMPUTATION TOE Left 10/07/2020   Procedure: AMPUTATION THIRD TOE;  Surgeon: Criselda Peaches, DPM;  Location: [facility];  Service: Podiatry;  Laterality: Left;  . AV FISTULA PLACEMENT Left 12/26/2017   Procedure: LEFT BRACHIOCEPHALIC ARTERIOVENOUS (AV) FISTULA CREATION;  Surgeon: Conrad [place], MD;  Location: [facility];  Service: Vascular;  Laterality: Left;  . AV FISTULA PLACEMENT Left 11/02/2020   Procedure: INSERTION OF ARTERIOVENOUS (AV) GORE-TEX GRAFT ARM;  Surgeon: Waynetta Sandy, MD;  [facility];  Service: Vascular;  Laterality: Left;  . BASCILIC VEIN TRANSPOSITION Left 02/16/2018   Procedure: BRACHIOBASILIC VEIN TRANSPOSITION SECOND STAGE;  Surgeon: Conrad [place], MD;  Location: [facility];  Service: Vascular;  Laterality: Left;  . CATARACT EXTRACTION W/ INTRAOCULAR LENS  IMPLANT, BILATERAL Bilateral   . ESOPHAGEAL BANDING N/A 04/01/2018   Procedure: ESOPHAGEAL BANDING;  Surgeon: Otis Brace, MD;  [facility];  Service: Gastroenterology;  Laterality: N/A;  . ESOPHAGEAL BANDING N/A 06/29/2018   Procedure: ESOPHAGEAL BANDING;  Surgeon: Otis Brace, MD;  [facility];  Service: Gastroenterology;  Laterality: N/A;  . ESOPHAGOGASTRODUODENOSCOPY (EGD) WITH PROPOFOL N/A 04/01/2018   Procedure: ESOPHAGOGASTRODUODENOSCOPY (EGD) WITH PROPOFOL;  Surgeon: Otis Brace, MD;  [facility];  Service: Gastroenterology;  Laterality: N/A;  . ESOPHAGOGASTRODUODENOSCOPY (EGD) WITH PROPOFOL N/A 06/29/2018   Procedure: ESOPHAGOGASTRODUODENOSCOPY (EGD) WITH PROPOFOL;  Surgeon: Otis Brace, MD;  [facility];  Service: Gastroenterology;  Laterality: N/A;  . ESOPHAGOGASTRODUODENOSCOPY (EGD) WITH PROPOFOL N/A 09/30/2018   Procedure: ESOPHAGOGASTRODUODENOSCOPY (EGD) WITH PROPOFOL;  Surgeon: Otis Brace, MD;  [facility];  Service:  Gastroenterology;  Laterality: N/A;  . ESOPHAGOGASTRODUODENOSCOPY (EGD) WITH PROPOFOL N/A 04/20/2019   Procedure: ESOPHAGOGASTRODUODENOSCOPY (EGD) WITH PROPOFOL;  Surgeon: Otis Brace, MD;  [facility];  Service: Gastroenterology;  Laterality: N/A;  . ESOPHAGOGASTRODUODENOSCOPY (EGD) WITH PROPOFOL N/A 04/17/2020   Procedure: ESOPHAGOGASTRODUODENOSCOPY (EGD) WITH PROPOFOL;  Surgeon: Wonda Horner, MD;  Location: [facility];  Service: Gastroenterology;  Laterality: N/A;  . I & D EXTREMITY Left 02/18/2018   Procedure: IRRIGATION AND DEBRIDEMENT LEFT ARM;  Surgeon: Serafina Mitchell, MD;  [facility];  Service: Vascular;  Laterality: Left;  . INSERTION OF DIALYSIS CATHETER N/A 01/19/2018   Procedure: INSERTION OF DIALYSIS CATHETER - RIGHT INTERNAL JUGULAR PLACEMENT;  Surgeon: Rosetta Posner, MD;  Location: [facility];  Service: Vascular;  Laterality: N/A;  . INSERTION OF DIALYSIS CATHETER Right 11/02/2020   Procedure: INSERTION OF TUNNELED DIALYSIS CATHETER;  Surgeon: Waynetta Sandy, MD;  Location: [facility];  Service: Vascular;  Laterality: Right;  . IR FLUORO GUIDE CV LINE RIGHT  11/10/2017  . IR PARACENTESIS  12/10/2017  . IR PARACENTESIS  12/26/2017  . IR REMOVAL TUN CV CATH W/O FL  11/28/2017  . IR THROMBECTOMY AV FISTULA W/THROMBOLYSIS/PTA INC/SHUNT/IMG LEFT Left 06/02/2019  . IR US GUIDE VASC ACCESS LEFT  06/02/2019  . IR US GUIDE VASC ACCESS RIGHT  11/10/2017  . NECK SURGERY  2007   "back of neck; no hardware in there"  . REVISON OF ARTERIOVENOUS FISTULA Left 11/02/2020   Procedure: REVISON OF ARTERIOVENOUS FISTULA;  Surgeon: Waynetta Sandy, MD;  Location: [facility];  Service: Vascular;  Laterality: Left;    Social History   Socioeconomic History  . Marital status: Married    Spouse name: Not on file  . Number of children: Not on file  . Years of education: Not on file  . Highest education level: Not on file  Occupational History  . Not on file  Tobacco Use  .  Smoking status: Never Smoker  . Smokeless tobacco: Never Used  Vaping Use  . Vaping Use: Never used  Substance and Sexual Activity  . Alcohol use: Never  . Drug use: Never  . Sexual activity: Not Currently  Other Topics Concern  . Not on file  Social History Narrative  . Not on file   Social Determinants of Health   Financial Resource Strain: Not on file  Food  Insecurity: Not on file  Transportation Needs: Not on file  Physical Activity: Not on file  Stress: Not on file  Social Connections: Not on file  Intimate Partner Violence: Not on file    Family History  Problem Relation Age of Onset  . Diabetes Mellitus II Sister   . Diabetes Sister   . Stroke Brother   . Heart attack Brother      Review of Systems  Constitutional: Negative.  Negative for chills and fever.  HENT: Negative.  Negative for congestion and sore throat.   Respiratory: Negative.  Negative for cough and shortness of breath.   Cardiovascular: Negative.  Negative for chest pain.  Gastrointestinal: Negative.  Negative for abdominal pain, diarrhea, nausea and vomiting.  Skin: Negative.  Negative for rash.  Neurological: Negative.  Negative for dizziness and headaches.  All other systems reviewed and are negative.  Today's Vitals   11/28/20 1452  BP: (!) 162/68  Pulse: 72  Temp: 100 F (37.8 C)  SpO2: 98%  Weight: 136 lb 11 oz (62 kg)  Height: 5' 5"  (1.651 m)   Body mass index is 22.75 kg/m.   Physical Exam Vitals reviewed.  Constitutional:      General: He is not in acute distress.    Comments: Chronically ill looking patient  HENT:     Head: Normocephalic.  Eyes:     Extraocular Movements: Extraocular movements intact.     Pupils: Pupils are equal, round, and reactive to light.  Cardiovascular:     Rate and Rhythm: Normal rate and regular rhythm.     Pulses: Normal pulses.     Heart sounds: Normal heart sounds.  Pulmonary:     Effort: Pulmonary effort is normal.     Breath sounds:  Normal breath sounds.  Musculoskeletal:     Cervical back: Normal range of motion and neck supple.  Skin:    General: Skin is warm and dry.     Capillary Refill: Capillary refill takes less than 2 seconds.     Comments: Feet: Good peripheral pulses.  Warm to touch. Status post amputation left third toe.  No signs of infection.  Well-healed.  Neurological:     General: No focal deficit present.     Mental Status: He is alert and oriented to person, place, and time.  Psychiatric:        Behavior: Behavior normal.      ASSESSMENT & PLAN: A total of 30 minutes was spent with the patient and counseling/coordination of care regarding multiple chronic medical problems, review of hospitalist discharge summary, review of most recent blood work results, review of all medications, prognosis, documentation, and need for follow-up. Patient is presently clinically stable but remains at high risk for rehospitalization given his multiple comorbidies and physical deconditioning.  Palliative care should be considered at this point.  [place] was seen today for hospitalization follow-up.  Diagnoses and all orders for this visit:  Hospital discharge follow-up  Physical deconditioning  Generalized weakness  ESRD on dialysis ([place])  Cirrhosis of liver with ascites, unspecified hepatic cirrhosis type ([place])  Goodpasture's disease ([place])  Pancytopenia ([place])  Duodenal mass  Type 2 diabetes mellitus with diabetic nephropathy, with long-term current use of insulin (HCC)  Hyperammonemia (HCC)  Chronic diastolic (congestive) heart failure (HCC)  Thrombocytopenia (HCC)    Patient Instructions   Cirrhosis  Cirrhosis is long-term (chronic) liver injury. The liver is the body's largest internal organ, and it performs many functions. It converts food into  energy, removes toxic material from the blood, makes important proteins, and absorbs necessary vitamins from food. In cirrhosis, healthy liver  cells are replaced by scar tissue. This prevents blood from flowing through the liver and makes it difficult for the liver to complete its functions. What are the causes? Common causes of this condition are hepatitis C and long-term alcohol abuse. Other causes include:  Nonalcoholic fatty liver disease (NAFLD). This happens when fat is deposited in the liver by causes other than alcohol.  Hepatitis B infection.  Autoimmune hepatitis. In this condition, the body's defense system (immune system) mistakenly attacks the liver cells, causing inflammation.  Diseases that cause blockage of ducts inside the liver.  Inherited liver diseases, such as hemochromatosis. This is one of the most common inherited liver diseases. In this disease, deposits of iron collect in the liver and other organs.  Reactions to certain long-term medicines, such as amiodarone, a heart medicine.  Parasitic infections. These include schistosomiasis, which is caused by a flatworm.  Long-term contact to certain toxins. These toxins include certain organic solvents, such as toluene and chloroform. What increases the risk? You are more likely to develop this condition if:  You have certain types of viral hepatitis.  You abuse alcohol, especially if you are male.  You are overweight.  You use IV drugs and share needles.  You have unprotected sex with someone who has viral hepatitis. What are the signs or symptoms? You may not have any signs and symptoms at first. Symptoms may not develop until the damage to your liver starts to get worse. Early symptoms may include:  Weakness and tiredness (fatigue).  Changes in sleep patterns or having trouble sleeping.  Itchiness.  Tenderness in the right-upper part of your abdomen.  Weight loss and muscle loss.  Nausea.  Loss of appetite. Later symptoms may include:  Fatigue or weakness that is getting worse.  Yellow skin and eyes (jaundice).  Buildup of fluid  in the abdomen (ascites). You may notice that your clothes are tight around your waist.  Weight gain and swelling of the feet and ankles (edema).  Trouble breathing.  Easy bruising and bleeding.  Vomiting blood, or black or bloody stool.  Mental confusion. How is this diagnosed? Your health care provider may suspect cirrhosis based on your symptoms and medical history, especially if you have other medical conditions or a history of alcohol abuse. Your health care provider will do a physical exam to feel your liver and to check for signs of cirrhosis. Tests may include:  Blood tests to check: ? For hepatitis B or C. ? Kidney function. ? Liver function.  Imaging tests such as: ? MRI or CT scan to look for changes seen in advanced cirrhosis. ? Ultrasound to see if normal liver tissue is being replaced by scar tissue.  A procedure in which a long needle is used to take a sample of liver tissue to be checked in a lab (biopsy). Liver biopsy can confirm the diagnosis of cirrhosis. How is this treated? Treatment for this condition depends on how damaged your liver is and what caused the damage. It may include treating the symptoms of cirrhosis, or treating the underlying causes to slow the damage. Treatment may include:  Making lifestyle changes, such as: ? Eating a healthy diet. You may need to work with your health care provider or a dietitian to develop an eating plan. ? Restricting salt intake. ? Maintaining a healthy weight. ? Not abusing drugs or alcohol.  Taking medicines to: ? Treat liver infections or other infections. ? Control itching. ? Reduce fluid buildup. ? Reduce certain blood toxins. ? Reduce risk of bleeding from enlarged blood vessels in the stomach or esophagus (varices).  Liver transplant. In this procedure, a liver from a donor is used to replace your diseased liver. This is done if cirrhosis has caused liver failure. Other treatments and procedures may be done  depending on the problems that you get from cirrhosis. Common problems include liver-related kidney failure (hepatorenal syndrome). Follow these instructions at home:  Take medicines only as told by your health care provider. Do not use medicines that are toxic to your liver. Ask your health care provider before taking any new medicines, including over-the-counter medicines such as NSAIDs.  Rest as needed.  Eat a well-balanced diet.  Limit your salt or water intake, if your health care provider asks you to do this.  Do not drink alcohol. This is especially important if you routinely take acetaminophen.  Keep all follow-up visits. This is important.   Contact a health care provider if you:  Have fatigue or weakness that is getting worse.  Develop swelling of the hands, feet, or legs, or a buildup of fluid in the abdomen (ascites).  Have a fever or chills.  Develop loss of appetite.  Have nausea or vomiting.  Develop jaundice.  Develop easy bruising or bleeding. Get help right away if you:  Vomit bright red blood or a material that looks like coffee grounds.  Have blood in your stools.  Notice that your stools appear black and tarry.  Become confused.  Have chest pain or trouble breathing. These symptoms may represent a serious problem that is an emergency. Do not wait to see if the symptoms will go away. Get medical help right away. Call your local emergency services (911 in the [place] Do not drive yourself to the hospital. Summary  Cirrhosis is chronic liver injury. Common causes are hepatitis C and long-term alcohol abuse.  Tests used to diagnose cirrhosis include blood tests, imaging tests, and liver biopsy.  Treatment for this condition involves treating the underlying cause. Avoid alcohol, drugs, salt, and medicines that may damage your liver.  Get help right away if you vomit bright red blood or a material that looks like coffee grounds. This information is not  intended to replace advice given to you by your health care provider. Make sure you discuss any questions you have with your health care provider. Document Revised: 05/04/2020 Document Reviewed: 05/04/2020 Elsevier Patient Education  2021 [place], MD [facility]

## 2020-11-29 DIAGNOSIS — R110 Nausea: Secondary | ICD-10-CM | POA: Diagnosis not present

## 2020-11-29 DIAGNOSIS — E7220 Disorder of urea cycle metabolism, unspecified: Secondary | ICD-10-CM | POA: Insufficient documentation

## 2020-11-29 DIAGNOSIS — R972 Elevated prostate specific antigen [PSA]: Secondary | ICD-10-CM | POA: Diagnosis not present

## 2020-12-01 DIAGNOSIS — H31003 Unspecified chorioretinal scars, bilateral: Secondary | ICD-10-CM | POA: Diagnosis not present

## 2020-12-01 DIAGNOSIS — E113393 Type 2 diabetes mellitus with moderate nonproliferative diabetic retinopathy without macular edema, bilateral: Secondary | ICD-10-CM | POA: Diagnosis not present

## 2020-12-01 LAB — HM DIABETES EYE EXAM

## 2020-12-03 DIAGNOSIS — E1122 Type 2 diabetes mellitus with diabetic chronic kidney disease: Secondary | ICD-10-CM | POA: Diagnosis not present

## 2020-12-03 DIAGNOSIS — Z992 Dependence on renal dialysis: Secondary | ICD-10-CM | POA: Diagnosis not present

## 2020-12-03 DIAGNOSIS — N186 End stage renal disease: Secondary | ICD-10-CM | POA: Diagnosis not present

## 2020-12-04 ENCOUNTER — Encounter: Payer: Self-pay | Admitting: Endocrinology

## 2020-12-05 DIAGNOSIS — D631 Anemia in chronic kidney disease: Secondary | ICD-10-CM | POA: Diagnosis not present

## 2020-12-05 DIAGNOSIS — N2581 Secondary hyperparathyroidism of renal origin: Secondary | ICD-10-CM | POA: Diagnosis not present

## 2020-12-05 DIAGNOSIS — Z992 Dependence on renal dialysis: Secondary | ICD-10-CM | POA: Diagnosis not present

## 2020-12-05 DIAGNOSIS — N186 End stage renal disease: Secondary | ICD-10-CM | POA: Diagnosis not present

## 2020-12-05 DIAGNOSIS — D509 Iron deficiency anemia, unspecified: Secondary | ICD-10-CM | POA: Diagnosis not present

## 2020-12-14 DIAGNOSIS — R972 Elevated prostate specific antigen [PSA]: Secondary | ICD-10-CM | POA: Insufficient documentation

## 2020-12-19 ENCOUNTER — Other Ambulatory Visit: Payer: Self-pay | Admitting: Endocrinology

## 2020-12-19 DIAGNOSIS — E1121 Type 2 diabetes mellitus with diabetic nephropathy: Secondary | ICD-10-CM

## 2020-12-19 DIAGNOSIS — K3189 Other diseases of stomach and duodenum: Secondary | ICD-10-CM | POA: Diagnosis not present

## 2020-12-19 DIAGNOSIS — R5381 Other malaise: Secondary | ICD-10-CM | POA: Diagnosis not present

## 2020-12-19 DIAGNOSIS — I8500 Esophageal varices without bleeding: Secondary | ICD-10-CM | POA: Diagnosis not present

## 2020-12-19 DIAGNOSIS — D696 Thrombocytopenia, unspecified: Secondary | ICD-10-CM | POA: Diagnosis not present

## 2020-12-19 DIAGNOSIS — R188 Other ascites: Secondary | ICD-10-CM | POA: Diagnosis not present

## 2020-12-19 DIAGNOSIS — Z95828 Presence of other vascular implants and grafts: Secondary | ICD-10-CM | POA: Diagnosis not present

## 2020-12-19 DIAGNOSIS — K7460 Unspecified cirrhosis of liver: Secondary | ICD-10-CM | POA: Diagnosis not present

## 2020-12-20 ENCOUNTER — Ambulatory Visit: Payer: Medicare Other | Admitting: Endocrinology

## 2020-12-20 ENCOUNTER — Encounter: Payer: Self-pay | Admitting: Endocrinology

## 2020-12-21 ENCOUNTER — Telehealth: Payer: Self-pay | Admitting: Endocrinology

## 2020-12-21 NOTE — Telephone Encounter (Signed)
Patient dismissed from [facility] by Elayne Snare, MD, effective 12/20/20. Dismissal Letter sent out by 1st class mail. [facility]

## 2020-12-25 ENCOUNTER — Emergency Department (HOSPITAL_COMMUNITY): Payer: Medicare Other

## 2020-12-25 ENCOUNTER — Encounter (HOSPITAL_COMMUNITY): Payer: Self-pay

## 2020-12-25 ENCOUNTER — Telehealth: Payer: Self-pay | Admitting: Emergency Medicine

## 2020-12-25 ENCOUNTER — Other Ambulatory Visit: Payer: Self-pay

## 2020-12-25 ENCOUNTER — Observation Stay (HOSPITAL_COMMUNITY)
Admission: EM | Admit: 2020-12-25 | Discharge: 2020-12-26 | Disposition: A | Discharge status: Home / Self Care | Payer: Medicare Other | Attending: Internal Medicine | Admitting: Internal Medicine

## 2020-12-25 DIAGNOSIS — Z992 Dependence on renal dialysis: Secondary | ICD-10-CM

## 2020-12-25 DIAGNOSIS — D649 Anemia, unspecified: Principal | ICD-10-CM | POA: Diagnosis present

## 2020-12-25 DIAGNOSIS — J810 Acute pulmonary edema: Secondary | ICD-10-CM | POA: Diagnosis not present

## 2020-12-25 DIAGNOSIS — I132 Hypertensive heart and chronic kidney disease with heart failure and with stage 5 chronic kidney disease, or end stage renal disease: Secondary | ICD-10-CM | POA: Diagnosis not present

## 2020-12-25 DIAGNOSIS — E1121 Type 2 diabetes mellitus with diabetic nephropathy: Secondary | ICD-10-CM | POA: Diagnosis not present

## 2020-12-25 DIAGNOSIS — N186 End stage renal disease: Secondary | ICD-10-CM

## 2020-12-25 DIAGNOSIS — J90 Pleural effusion, not elsewhere classified: Secondary | ICD-10-CM | POA: Diagnosis not present

## 2020-12-25 DIAGNOSIS — I5022 Chronic systolic (congestive) heart failure: Secondary | ICD-10-CM | POA: Diagnosis not present

## 2020-12-25 DIAGNOSIS — E1122 Type 2 diabetes mellitus with diabetic chronic kidney disease: Secondary | ICD-10-CM | POA: Insufficient documentation

## 2020-12-25 DIAGNOSIS — Z20822 Contact with and (suspected) exposure to covid-19: Secondary | ICD-10-CM | POA: Insufficient documentation

## 2020-12-25 DIAGNOSIS — Z794 Long term (current) use of insulin: Secondary | ICD-10-CM | POA: Diagnosis not present

## 2020-12-25 DIAGNOSIS — D61818 Other pancytopenia: Secondary | ICD-10-CM | POA: Diagnosis not present

## 2020-12-25 DIAGNOSIS — R059 Cough, unspecified: Secondary | ICD-10-CM | POA: Diagnosis not present

## 2020-12-25 DIAGNOSIS — K7460 Unspecified cirrhosis of liver: Secondary | ICD-10-CM | POA: Diagnosis not present

## 2020-12-25 DIAGNOSIS — E1129 Type 2 diabetes mellitus with other diabetic kidney complication: Secondary | ICD-10-CM | POA: Diagnosis present

## 2020-12-25 DIAGNOSIS — I10 Essential (primary) hypertension: Secondary | ICD-10-CM | POA: Diagnosis present

## 2020-12-25 DIAGNOSIS — E1165 Type 2 diabetes mellitus with hyperglycemia: Secondary | ICD-10-CM | POA: Diagnosis not present

## 2020-12-25 DIAGNOSIS — R188 Other ascites: Secondary | ICD-10-CM | POA: Diagnosis not present

## 2020-12-25 DIAGNOSIS — R0902 Hypoxemia: Secondary | ICD-10-CM | POA: Diagnosis not present

## 2020-12-25 DIAGNOSIS — R0602 Shortness of breath: Secondary | ICD-10-CM | POA: Diagnosis not present

## 2020-12-25 DIAGNOSIS — Z79899 Other long term (current) drug therapy: Secondary | ICD-10-CM | POA: Insufficient documentation

## 2020-12-25 LAB — CBC WITH DIFFERENTIAL/PLATELET
Abs Immature Granulocytes: 0 10*3/uL (ref 0.00–0.07)
Basophils Absolute: 0 10*3/uL (ref 0.0–0.1)
Basophils Relative: 1 %
Eosinophils Absolute: 0.2 10*3/uL (ref 0.0–0.5)
Eosinophils Relative: 6 %
HCT: 25.2 % — ABNORMAL LOW (ref 39.0–52.0)
Hemoglobin: 7.9 g/dL — ABNORMAL LOW (ref 13.0–17.0)
Immature Granulocytes: 0 %
Lymphocytes Relative: 27 %
Lymphs Abs: 0.7 10*3/uL (ref 0.7–4.0)
MCH: 27.1 pg (ref 26.0–34.0)
MCHC: 31.3 g/dL (ref 30.0–36.0)
MCV: 86.6 fL (ref 80.0–100.0)
Monocytes Absolute: 0.4 10*3/uL (ref 0.1–1.0)
Monocytes Relative: 14 %
Neutro Abs: 1.4 10*3/uL — ABNORMAL LOW (ref 1.7–7.7)
Neutrophils Relative %: 52 %
Platelets: 63 10*3/uL — ABNORMAL LOW (ref 150–400)
RBC: 2.91 MIL/uL — ABNORMAL LOW (ref 4.22–5.81)
RDW: 17.8 % — ABNORMAL HIGH (ref 11.5–15.5)
WBC: 2.6 10*3/uL — ABNORMAL LOW (ref 4.0–10.5)
nRBC: 0 % (ref 0.0–0.2)

## 2020-12-25 LAB — COMPREHENSIVE METABOLIC PANEL
ALT: 16 U/L (ref 0–44)
AST: 36 U/L (ref 15–41)
Albumin: 2.4 g/dL — ABNORMAL LOW (ref 3.5–5.0)
Alkaline Phosphatase: 156 U/L — ABNORMAL HIGH (ref 38–126)
Anion gap: 13 (ref 5–15)
BUN: 49 mg/dL — ABNORMAL HIGH (ref 8–23)
CO2: 23 mmol/L (ref 22–32)
Calcium: 7.9 mg/dL — ABNORMAL LOW (ref 8.9–10.3)
Chloride: 95 mmol/L — ABNORMAL LOW (ref 98–111)
Creatinine, Ser: 7.45 mg/dL — ABNORMAL HIGH (ref 0.61–1.24)
GFR, Estimated: 7 mL/min — ABNORMAL LOW (ref >60)
Glucose, Bld: 247 mg/dL — ABNORMAL HIGH (ref 70–99)
Potassium: 5.4 mmol/L — ABNORMAL HIGH (ref 3.5–5.1)
Sodium: 131 mmol/L — ABNORMAL LOW (ref 135–145)
Total Bilirubin: 1.6 mg/dL — ABNORMAL HIGH (ref 0.3–1.2)
Total Protein: 6.9 g/dL (ref 6.5–8.1)

## 2020-12-25 LAB — RENAL FUNCTION PANEL
Albumin: 2.1 g/dL — ABNORMAL LOW (ref 3.5–5.0)
Anion gap: 10 (ref 5–15)
BUN: 48 mg/dL — ABNORMAL HIGH (ref 8–23)
CO2: 25 mmol/L (ref 22–32)
Calcium: 7.5 mg/dL — ABNORMAL LOW (ref 8.9–10.3)
Chloride: 98 mmol/L (ref 98–111)
Creatinine, Ser: 7.31 mg/dL — ABNORMAL HIGH (ref 0.61–1.24)
GFR, Estimated: 7 mL/min — ABNORMAL LOW (ref >60)
Glucose, Bld: 195 mg/dL — ABNORMAL HIGH (ref 70–99)
Phosphorus: 4.9 mg/dL — ABNORMAL HIGH (ref 2.5–4.6)
Potassium: 5 mmol/L (ref 3.5–5.1)
Sodium: 133 mmol/L — ABNORMAL LOW (ref 135–145)

## 2020-12-25 LAB — CBC
HCT: 21.9 % — ABNORMAL LOW (ref 39.0–52.0)
Hemoglobin: 6.8 g/dL — CL (ref 13.0–17.0)
MCH: 26.9 pg (ref 26.0–34.0)
MCHC: 31.1 g/dL (ref 30.0–36.0)
MCV: 86.6 fL (ref 80.0–100.0)
Platelets: 62 10*3/uL — ABNORMAL LOW (ref 150–400)
RBC: 2.53 MIL/uL — ABNORMAL LOW (ref 4.22–5.81)
RDW: 17.8 % — ABNORMAL HIGH (ref 11.5–15.5)
WBC: 2.7 10*3/uL — ABNORMAL LOW (ref 4.0–10.5)
nRBC: 0 % (ref 0.0–0.2)

## 2020-12-25 LAB — GLUCOSE, CAPILLARY: Glucose-Capillary: 285 mg/dL — ABNORMAL HIGH (ref 70–99)

## 2020-12-25 LAB — TROPONIN I (HIGH SENSITIVITY): Troponin I (High Sensitivity): 21 ng/L — ABNORMAL HIGH (ref <18)

## 2020-12-25 LAB — RETICULOCYTES
Immature Retic Fract: 24.8 % — ABNORMAL HIGH (ref 2.3–15.9)
RBC.: 2.54 MIL/uL — ABNORMAL LOW (ref 4.22–5.81)
Retic Count, Absolute: 86.1 10*3/uL (ref 19.0–186.0)
Retic Ct Pct: 3.4 % — ABNORMAL HIGH (ref 0.4–3.1)

## 2020-12-25 LAB — HEMOGLOBIN AND HEMATOCRIT, BLOOD
HCT: 21.7 % — ABNORMAL LOW (ref 39.0–52.0)
Hemoglobin: 6.9 g/dL — CL (ref 13.0–17.0)

## 2020-12-25 LAB — POC SARS CORONAVIRUS 2 AG -  ED: SARSCOV2ONAVIRUS 2 AG: NEGATIVE

## 2020-12-25 LAB — IRON AND TIBC
Iron: 45 ug/dL (ref 45–182)
Saturation Ratios: 22 % (ref 17.9–39.5)
TIBC: 204 ug/dL — ABNORMAL LOW (ref 250–450)
UIBC: 159 ug/dL

## 2020-12-25 LAB — PREPARE RBC (CROSSMATCH)

## 2020-12-25 LAB — FOLATE: Folate: 17.1 ng/mL (ref >5.9)

## 2020-12-25 LAB — LIPASE, BLOOD: Lipase: 38 U/L (ref 11–51)

## 2020-12-25 LAB — VITAMIN B12: Vitamin B-12: 2351 pg/mL — ABNORMAL HIGH (ref 180–914)

## 2020-12-25 LAB — BRAIN NATRIURETIC PEPTIDE: B Natriuretic Peptide: 3263.1 pg/mL — ABNORMAL HIGH (ref 0.0–100.0)

## 2020-12-25 LAB — FERRITIN: Ferritin: 859 ng/mL — ABNORMAL HIGH (ref 24–336)

## 2020-12-25 MED ORDER — INSULIN ASPART 100 UNIT/ML IJ SOLN
0.0000 [IU] | Freq: Three times a day (TID) | INTRAMUSCULAR | Status: DC
  Administered 2020-12-26: 1 [IU] via SUBCUTANEOUS

## 2020-12-25 MED ORDER — SODIUM CHLORIDE 0.9 % IV SOLN: 100.0000 mL | INTRAVENOUS | Status: DC | PRN

## 2020-12-25 MED ORDER — PENTAFLUOROPROP-TETRAFLUOROETH EX AERO: 1.0000 "application " | INHALATION_SPRAY | CUTANEOUS | Status: DC | PRN

## 2020-12-25 MED ORDER — ACETAMINOPHEN 650 MG RE SUPP: 650.0000 mg | Freq: Four times a day (QID) | RECTAL | Status: DC | PRN

## 2020-12-25 MED ORDER — ACETAMINOPHEN 325 MG PO TABS: 650.0000 mg | ORAL_TABLET | Freq: Four times a day (QID) | ORAL | Status: DC | PRN

## 2020-12-25 MED ORDER — RIFAXIMIN 550 MG PO TABS
550.0000 mg | ORAL_TABLET | Freq: Two times a day (BID) | ORAL | Status: DC
  Administered 2020-12-25 – 2020-12-26 (×2): 550 mg via ORAL
  Filled 2020-12-25 (×3): qty 1

## 2020-12-25 MED ORDER — NITROGLYCERIN 0.4 MG SL SUBL
0.4000 mg | SUBLINGUAL_TABLET | SUBLINGUAL | Status: DC | PRN
  Administered 2020-12-25: 0.4 mg via SUBLINGUAL

## 2020-12-25 MED ORDER — LIDOCAINE-PRILOCAINE 2.5-2.5 % EX CREA: 1.0000 "application " | TOPICAL_CREAM | CUTANEOUS | Status: DC | PRN

## 2020-12-25 MED ORDER — ANTICOAGULANT SODIUM CITRATE 4% (200MG/5ML) IV SOLN
5.0000 mL | Freq: Once | Status: AC
  Administered 2020-12-25: 5 mL
  Filled 2020-12-25: qty 5

## 2020-12-25 MED ORDER — INSULIN ASPART 100 UNIT/ML IJ SOLN
0.0000 [IU] | Freq: Every day | INTRAMUSCULAR | Status: DC
  Administered 2020-12-25: 2 [IU] via SUBCUTANEOUS

## 2020-12-25 MED ORDER — LACTULOSE 10 GM/15ML PO SOLN
30.0000 g | Freq: Three times a day (TID) | ORAL | Status: DC
  Administered 2020-12-26: 30 g via ORAL
  Filled 2020-12-25 (×3): qty 45

## 2020-12-25 MED ORDER — ONDANSETRON HCL 4 MG PO TABS: 4.0000 mg | ORAL_TABLET | Freq: Four times a day (QID) | ORAL | Status: DC | PRN

## 2020-12-25 MED ORDER — NITROGLYCERIN 2 % TD OINT
1.0000 [in_us] | TOPICAL_OINTMENT | Freq: Once | TRANSDERMAL | Status: AC
  Administered 2020-12-25: 1 [in_us] via TOPICAL
  Filled 2020-12-25: qty 1

## 2020-12-25 MED ORDER — ALTEPLASE 2 MG IJ SOLR: 2.0000 mg | Freq: Once | INTRAMUSCULAR | Status: DC | PRN

## 2020-12-25 MED ORDER — CHLORHEXIDINE GLUCONATE CLOTH 2 % EX PADS
6.0000 | MEDICATED_PAD | Freq: Every day | CUTANEOUS | Status: DC
  Administered 2020-12-26: 6 via TOPICAL

## 2020-12-25 MED ORDER — FUROSEMIDE 10 MG/ML IJ SOLN
120.0000 mg | Freq: Once | INTRAVENOUS | Status: AC
  Administered 2020-12-25: 120 mg via INTRAVENOUS
  Filled 2020-12-25: qty 2

## 2020-12-25 MED ORDER — ONDANSETRON HCL 4 MG/2ML IJ SOLN: 4.0000 mg | Freq: Four times a day (QID) | INTRAMUSCULAR | Status: DC | PRN

## 2020-12-25 MED ORDER — LIDOCAINE HCL (PF) 1 % IJ SOLN: 5.0000 mL | INTRAMUSCULAR | Status: DC | PRN

## 2020-12-25 MED ORDER — SODIUM CHLORIDE 0.9% IV SOLUTION: Freq: Once | INTRAVENOUS | Status: AC

## 2020-12-25 NOTE — ED Provider Notes (Signed)
8 male on dialysis t,th, Saturday presented today with dypsnea and appeared volume overload. DOE, Lasix here and plan to dialyze now. Likely d/c if patient improved after dialysis TRop at baseline, elevated bnp, not new oxygen requirement   Pattricia Boss, MD 12/25/20 2302

## 2020-12-25 NOTE — ED Notes (Signed)
Attempted to call report, RN in a pt room, will call back in 5 to give report.

## 2020-12-25 NOTE — Telephone Encounter (Signed)
Patient's son calling regarding his father, he is experiencing shortness of breath and cough  These are new symptoms, patient has not been around anyone with covid.  Sent [facility] for triage

## 2020-12-25 NOTE — ED Notes (Signed)
Patient transported to X-ray 

## 2020-12-25 NOTE — Telephone Encounter (Signed)
Caller has shortness of breath and cough. Dr.Sagurdia. Advised to go to ED. Patient is at ED.

## 2020-12-25 NOTE — ED Provider Notes (Signed)
Pt returned from dialysis for reevaluation.  Pt noted to have decrease in hemoglobin.  Blood transfusion ordered x 2 units.   I will consult hospitaist for admission    Sidney Ace 12/25/20 1853    Pattricia Boss, MD 12/25/20 [phone]

## 2020-12-25 NOTE — H&P (Signed)
History and Physical    HOLDEN MANISCALCO QQI:[id number] DOB: [birth year] DOA: 12/25/2020  PCP: Horald Pollen, MD   Patient coming from: Home   Chief Complaint: SOB, fatigue, cough   HPI: William [name] is a 70 y.o. male with medical history significant for Goodpasture disease, ESRD on hemodialysis, insulin-dependent diabetes mellitus, liver cirrhosis status post TIPS, and chronic pancytopenia, now presenting to the emergency department with shortness of breath, cough, and fatigue.  Symptoms worsened significantly over the past 1 day.  Patient denies chest pain, fevers, chills, melena, hematochezia, abdominal pain, nausea, or vomiting.  ED Course: Upon arrival to the ED, patient is found to be afebrile, saturating low to mid 90s on room air, and slightly tachypneic.  EKG features sinus rhythm.  Chest x-ray notable for diffuse bilateral lung densities suggestive of pulmonary edema and probable small pleural effusion.  Chemistry panel features a sodium 133, potassium 5.0, bicarbonate 25, and BUN 48.  CBC notable for stable leukopenia and thrombocytopenia, and hemoglobin 6.8, down from 7.9.  BNP was elevated to over 3000 and troponin just slightly elevated.  Patient was given 120 mg Lasix and dialyzed, had improvement in his shortness of breath with that, 1 unit of RBC was ordered for transfusion, and hospitalist asked to observe the patient overnight for transfusion.  Review of Systems:  All other systems reviewed and apart from HPI, are negative.  Past Medical History:  Diagnosis Date  . Anemia   . Blind one eye   . CHF (congestive heart failure) ([place])   . Cirrhosis ([place])   . ESRD on hemodialysis ([place])   . Goodpasture's disease [facility])    on outpatient plasmapheresis/notes 11/20/2017  . Grade I diastolic dysfunction 94/17/4081  . History of anemia due to chronic kidney disease   . History of blood transfusion X 1   "UGIB; low blood count"  . History of plasmapheresis    "qod"  (11/20/2017)  . Hypertension   . Pancytopenia ([place]) 08/20/2017  . Type II diabetes mellitus ([place])     Past Surgical History:  Procedure Laterality Date  . A/V FISTULAGRAM Left 10/16/2020   Procedure: A/V FISTULAGRAM;  Surgeon: Waynetta Sandy, MD;  Location: [place] [facility];  Service: Cardiovascular;  Laterality: Left;  . AMPUTATION TOE Left 10/07/2020   Procedure: AMPUTATION THIRD TOE;  Surgeon: Criselda Peaches, DPM;  Location: [place];  Service: Podiatry;  Laterality: Left;  . AV FISTULA PLACEMENT Left 12/26/2017   Procedure: LEFT BRACHIOCEPHALIC ARTERIOVENOUS (AV) FISTULA CREATION;  Surgeon: Conrad [place], MD;  Location: [place];  Service: Vascular;  Laterality: Left;  . AV FISTULA PLACEMENT Left 11/02/2020   Procedure: INSERTION OF ARTERIOVENOUS (AV) GORE-TEX GRAFT ARM;  Surgeon: Waynetta Sandy, MD;  Location: [place];  Service: Vascular;  Laterality: Left;  . BASCILIC VEIN TRANSPOSITION Left 02/16/2018   Procedure: BRACHIOBASILIC VEIN TRANSPOSITION SECOND STAGE;  Surgeon: Conrad Bern, MD;  Location: [place];  Service: Vascular;  Laterality: Left;  . CATARACT EXTRACTION W/ INTRAOCULAR LENS  IMPLANT, BILATERAL Bilateral   . ESOPHAGEAL BANDING N/A 04/01/2018   Procedure: ESOPHAGEAL BANDING;  Surgeon: Otis Brace, MD;  Location: [facility];  Service: Gastroenterology;  Laterality: N/A;  . ESOPHAGEAL BANDING N/A 06/29/2018   Procedure: ESOPHAGEAL BANDING;  Surgeon: Otis Brace, MD;  Location: [facility];  Service: Gastroenterology;  Laterality: N/A;  . ESOPHAGOGASTRODUODENOSCOPY (EGD) WITH PROPOFOL N/A 04/01/2018   Procedure: ESOPHAGOGASTRODUODENOSCOPY (EGD) WITH PROPOFOL;  Surgeon: Otis Brace, MD;  [facility];  Service: Gastroenterology;  Laterality: N/A;  . ESOPHAGOGASTRODUODENOSCOPY (EGD) WITH PROPOFOL N/A 06/29/2018   Procedure: ESOPHAGOGASTRODUODENOSCOPY (EGD) WITH PROPOFOL;  Surgeon: Otis Brace, MD;  [facility];  Service:  Gastroenterology;  Laterality: N/A;  . ESOPHAGOGASTRODUODENOSCOPY (EGD) WITH PROPOFOL N/A 09/30/2018   Procedure: ESOPHAGOGASTRODUODENOSCOPY (EGD) WITH PROPOFOL;  Surgeon: Otis Brace, MD;  [facility];  Service: Gastroenterology;  Laterality: N/A;  . ESOPHAGOGASTRODUODENOSCOPY (EGD) WITH PROPOFOL N/A 04/20/2019   Procedure: ESOPHAGOGASTRODUODENOSCOPY (EGD) WITH PROPOFOL;  Surgeon: Otis Brace, MD;  [facility];  Service: Gastroenterology;  Laterality: N/A;  . ESOPHAGOGASTRODUODENOSCOPY (EGD) WITH PROPOFOL N/A 04/17/2020   Procedure: ESOPHAGOGASTRODUODENOSCOPY (EGD) WITH PROPOFOL;  Surgeon: Wonda Horner, MD;  Location: [place];  Service: Gastroenterology;  Laterality: N/A;  . I & D EXTREMITY Left 02/18/2018   Procedure: IRRIGATION AND DEBRIDEMENT LEFT ARM;  Surgeon: Serafina Mitchell, MD;  Location: [facility];  Service: Vascular;  Laterality: Left;  . INSERTION OF DIALYSIS CATHETER N/A 01/19/2018   Procedure: INSERTION OF DIALYSIS CATHETER - RIGHT INTERNAL JUGULAR PLACEMENT;  Surgeon: Rosetta Posner, MD;  Location: [place];  Service: Vascular;  Laterality: N/A;  . INSERTION OF DIALYSIS CATHETER Right 11/02/2020   Procedure: INSERTION OF TUNNELED DIALYSIS CATHETER;  Surgeon: Waynetta Sandy, MD;  Location: [place];  Service: Vascular;  Laterality: Right;  . IR FLUORO GUIDE CV LINE RIGHT  11/10/2017  . IR PARACENTESIS  12/10/2017  . IR PARACENTESIS  12/26/2017  . IR REMOVAL TUN CV CATH W/O FL  11/28/2017  . IR THROMBECTOMY AV FISTULA W/THROMBOLYSIS/PTA INC/SHUNT/IMG LEFT Left 06/02/2019  . IR US GUIDE VASC ACCESS LEFT  06/02/2019  . IR US GUIDE VASC ACCESS RIGHT  11/10/2017  . NECK SURGERY  2007   "back of neck; no hardware in there"  . REVISON OF ARTERIOVENOUS FISTULA Left 11/02/2020   Procedure: REVISON OF ARTERIOVENOUS FISTULA;  Surgeon: Waynetta Sandy, MD;  Location: [place];  Service: Vascular;  Laterality: Left;    Social History:   reports that he  has never smoked. He has never used smokeless tobacco. He reports that he does not drink alcohol and does not use drugs.  Allergies  Allergen Reactions  . Heparin Other (See Comments)    Patient is Muslim and is not permitted Pork derative due to religious belief)  . Pork-Derived Products Other (See Comments)    Patient does not eat pork due to religious beliefs    Family History  Problem Relation Age of Onset  . Diabetes Mellitus II Sister   . Diabetes Sister   . Stroke Brother   . Heart attack Brother      Prior to Admission medications   Medication Sig Start Date End Date Taking? Authorizing Provider  calcium acetate (PHOSLO) 667 MG capsule Take 1 capsule (667 mg total) by mouth 3 (three) times daily with meals. 05/11/20   Aline August, MD  glucose blood (ONETOUCH VERIO) test strip USE 1 STRIP TO CHECK GLUCOSE THREE TIMES DAILY 12/19/20   Elayne Snare, MD  insulin degludec (TRESIBA FLEXTOUCH) 100 UNIT/ML FlexTouch Pen Inject 14 Units into the skin daily. Inject 14 units under the skin once daily. NO FURTHER REFILLS UNTIL PATIENT IS SEEN IN THE OFFICE Patient taking differently: Inject 12 Units into the skin daily before breakfast. 09/04/20   Elayne Snare, MD  Insulin Pen Needle (ADVOCATE INSULIN PEN NEEDLES) 31G X 5 MM MISC Use four times daily to inject insulin 04/27/18   Elayne Snare, MD  insulin regular (NOVOLIN  R,HUMULIN R) 100 units/mL injection Inject 6-8 Units into the skin See admin instructions. Inject 8 units subcutaneously with breakfast, and 6 units with lunch and supper    [provider]  lactulose (CHRONULAC) 10 GM/15ML solution Take 45 mLs (30 g total) by mouth 3 (three) times daily. Patient taking differently: Take 30 g by mouth daily. 05/11/20   Aline August, MD  lidocaine-prilocaine (EMLA) cream Apply 1 application topically See admin instructions. Apply topically prior to dialysis on Tuesday, Thursday, Saturday 08/24/18   [provider]  Methoxy  PEG-Epoetin Beta (MIRCERA IJ) 1 Dose See admin instructions. Dialysis gives this medication 05/09/20 05/08/21  [provider]  Multiple Vitamin (MULTIVITAMIN WITH MINERALS) TABS tablet Take 1 tablet by mouth daily.    [provider]  mupirocin ointment (BACTROBAN) 2 % Apply 1 application topically daily. 10/26/20   McDonald, Stephan Minister, DPM  ondansetron (ZOFRAN ODT) 4 MG disintegrating tablet Take 1 tablet (4 mg total) by mouth 2 (two) times daily as needed for nausea or vomiting. 05/01/20   [name], Madelynn Done C, PA-C  ONE TOUCH LANCETS MISC Use to test blood sugar three times daily 04/27/18   Elayne Snare, MD  rifaximin (XIFAXAN) 550 MG TABS tablet Take 1 tablet (550 mg total) by mouth 2 (two) times daily. 07/07/20   Shawna Clamp, MD  rosuvastatin (CRESTOR) 5 MG tablet Take 1 tablet (5 mg total) by mouth daily. 10/18/20   Elayne Snare, MD  sevelamer carbonate (RENVELA) 800 MG tablet Take 800 mg by mouth 3 (three) times daily. 09/25/20   [provider]    Physical Exam: Vitals:   12/25/20 1630 12/25/20 1700 12/25/20 1725 12/25/20 1840  BP: 98/70 (!) 125/56 135/62 (!) 162/58  Pulse: 80 79 82 86  Resp:    16  Temp:   98.5 F (36.9 C) 98.4 F (36.9 C)  TempSrc:   Oral   SpO2:   100% 99%    Constitutional: NAD, calm  Eyes: PERTLA, lids and conjunctivae normal ENMT: Mucous membranes are moist. Posterior pharynx clear of any exudate or lesions.   Neck:  supple, no masses  Respiratory:  no wheezing, no crackles. No accessory muscle use.  Cardiovascular: S1 & S2 heard, regular rate and rhythm. Trace leg swelling b/l.  Abdomen: No distension, no tenderness, soft. Bowel sounds active.  Musculoskeletal: no clubbing / cyanosis. No joint deformity upper and lower extremities.   Skin: no significant rashes, lesions, ulcers. Warm, dry, well-perfused. Neurologic: CN 2-12 grossly intact. Sensation intact. Moving all extremities.  Psychiatric: Alert and oriented to person, place, and  situation. Pleasant and cooperative.    Labs and Imaging on Admission: I have personally reviewed following labs and imaging studies  CBC: Recent Labs  Lab 12/25/20 1016 12/25/20 1412  WBC 2.6* 2.7*  NEUTROABS 1.4*  --   HGB 7.9* 6.8*  HCT 25.2* 21.9*  MCV 86.6 86.6  PLT 63* 62*   Basic Metabolic Panel: Recent Labs  Lab 12/25/20 1016 12/25/20 1412  NA 131* 133*  K 5.4* 5.0  CL 95* 98  CO2 23 25  GLUCOSE 247* 195*  BUN 49* 48*  CREATININE 7.45* 7.31*  CALCIUM 7.9* 7.5*  PHOS  --  4.9*   GFR: CrCl cannot be calculated (Unknown ideal weight.). Liver Function Tests: Recent Labs  Lab 12/25/20 1016 12/25/20 1412  AST 36  --   ALT 16  --   ALKPHOS 156*  --   BILITOT 1.6*  --   PROT 6.9  --  ALBUMIN 2.4* 2.1*   Recent Labs  Lab 12/25/20 1016  LIPASE 38   No results for input(s): AMMONIA in the last 168 hours. Coagulation Profile: No results for input(s): INR, PROTIME in the last 168 hours. Cardiac Enzymes: No results for input(s): CKTOTAL, CKMB, CKMBINDEX, TROPONINI in the last 168 hours. BNP (last 3 results) No results for input(s): PROBNP in the last 8760 hours. HbA1C: No results for input(s): HGBA1C in the last 72 hours. CBG: No results for input(s): GLUCAP in the last 168 hours. Lipid Profile: No results for input(s): CHOL, HDL, LDLCALC, TRIG, CHOLHDL, LDLDIRECT in the last 72 hours. Thyroid Function Tests: No results for input(s): TSH, T4TOTAL, FREET4, T3FREE, THYROIDAB in the last 72 hours. Anemia Panel: No results for input(s): VITAMINB12, FOLATE, FERRITIN, TIBC, IRON, RETICCTPCT in the last 72 hours. Urine analysis:    Component Value Date/Time   COLORURINE YELLOW 11/14/2020 0638   APPEARANCEUR HAZY (A) 11/14/2020 0638   LABSPEC 1.020 11/14/2020 0638   PHURINE 7.0 11/14/2020 0638   GLUCOSEU 250 (A) 11/14/2020 0638   HGBUR LARGE (A) 11/14/2020 0638   BILIRUBINUR SMALL (A) 11/14/2020 0638   BILIRUBINUR neg 09/14/2015 1441   KETONESUR  NEGATIVE 11/14/2020 0638   PROTEINUR >300 (A) 11/14/2020 0638   UROBILINOGEN 0.2 09/14/2015 1441   UROBILINOGEN 0.2 01/27/2013 2052   NITRITE NEGATIVE 11/14/2020 0638   LEUKOCYTESUR NEGATIVE 11/14/2020 0638   Sepsis Labs: @LABRCNTIP (procalcitonin:4,lacticidven:4) )No results found for this or any previous visit (from the past 240 hour(s)).   Radiological Exams on Admission: DG Chest 2 View  Result Date: 12/25/2020 CLINICAL DATA:  70 year old with shortness of breath. EXAM: CHEST - 2 VIEW COMPARISON:  11/13/2020 FINDINGS: Right jugular dialysis catheter with the tip at the superior cavoatrial junction. Diffuse parenchymal lung densities bilaterally. Pattern is suggestive for pulmonary edema. Basilar densities and suspect pleural effusions. Heart size appears to be stable and within normal limits. Atherosclerotic calcifications at the aortic arch. Negative for a pneumothorax. Multiple embolization coils in the upper abdomen. Probable TIPS stent. IMPRESSION: 1. Diffuse bilateral parenchymal lung densities. Findings are suggestive for pulmonary edema. Probable small pleural effusions. 2. Dialysis catheter with tip at the superior cavoatrial junction. Electronically Signed   By: Markus Daft M.D.   On: 12/25/2020 11:20    EKG: Independently reviewed. Sinus rhythm.   Assessment/Plan   1. Symptomatic anemia; pancytopenia   - Patient with chronic pancytopenia likely related to liver cirrhosis presented with SOB and fatigue, was hypervolemic and improveed with HD but noted to have Hgb 6.8 which may also be contributing to symptoms  - He denies any melena, hematochezia, abdominal pain, or N/V  - 1 unit RBC ordered from ED  - Check anemia panel, FOBT, and post-transfusion H&H    2. ESRD  - Presented initially with volume overload and was dialyzed from ED 12/25/20  - Restrict fluids, renally-dose medications, monitor    3. Cirrhosis  - Appears compensated, is s/p TIPS and on transplant list  -  Continue lactulose and rifaximin    4. Type II DM  - A1c was 6.2% in March 2022  - Continue CBG checks and insulin     DVT prophylaxis: SCDs Code Status: Full  Level of Care: Level of care: Telemetry Medical Family Communication: Son updated at bedside Disposition Plan:  Patient is from: home  Anticipated d/c is to: Home  Anticipated d/c date is: 12/26/20 Patient currently: pending post-transfusion H&H  Consults called: none  Admission status: Observation  Vianne Bulls, MD [facility]  12/25/2020, 7:55 PM

## 2020-12-25 NOTE — ED Notes (Signed)
Attempted to call report on pt, secretary states the RN will call back for report.

## 2020-12-25 NOTE — ED Provider Notes (Signed)
[facility] EMERGENCY DEPARTMENT Provider Note   CSN: [id number] Arrival date & time: 12/25/20  1856     History Chief Complaint  Patient presents with  . Shortness of Breath    William [name] is a 70 y.o. male.   Shortness of Breath Severity:  Moderate Onset quality:  Gradual Duration:  12 hours Timing:  Rare Progression:  Partially resolved Chronicity:  New Relieved by:  Nothing Worsened by:  Nothing Ineffective treatments:  None tried Associated symptoms: abdominal pain and chest pain   Associated symptoms: no cough, no fever, no headaches, no rash, no sputum production and no vomiting        Past Medical History:  Diagnosis Date  . Anemia   . Blind one eye   . CHF (congestive heart failure) ([place])   . Cirrhosis ([place])   . ESRD on hemodialysis ([place])   . Goodpasture's disease [facility])    on outpatient plasmapheresis/notes 11/20/2017  . Grade I diastolic dysfunction 31/49/7026  . History of anemia due to chronic kidney disease   . History of blood transfusion X 1   "UGIB; low blood count"  . History of plasmapheresis    "qod" (11/20/2017)  . Hypertension   . Pancytopenia ([place]) 08/20/2017  . Type II diabetes mellitus [facility])     Patient Active Problem List   Diagnosis Date Noted  . Hyperammonemia ([place]) 11/29/2020  . FUO (fever of unknown origin) 11/13/2020  . Hepatic encephalopathy ([place]) 11/08/2020  . AMS (altered mental status) 11/03/2020  . Diabetic foot infection ([place]) 10/06/2020  . Gangrene of toe of left foot ([place])   . Goals of care, counseling/discussion   . Palliative care by specialist   . AV fistula thrombosis, initial encounter ([place]) 06/01/2019  . AV fistula occlusion, initial encounter ([place])   . Chronic systolic CHF (congestive heart failure) ([place])   . Esophageal varices ([place]) 09/23/2018  . Thrombocytopenia ([place]) 09/23/2018  . CHF (congestive heart failure) ([place]) 09/22/2018  . ESRD on dialysis ([place]) 02/06/2018  . CKD (chronic  kidney disease) stage 4, GFR 15-29 ml/min (HCC) 12/19/2017  . Cirrhosis of liver with ascites ([place]) 12/09/2017  . Goodpasture's syndrome ([place]) with associated hemoptysis 11/20/2017  . Pancytopenia ([place]) 11/06/2017  . Essential hypertension 11/06/2017  . Type II diabetes mellitus with renal manifestations ([place]) 08/25/2015    Past Surgical History:  Procedure Laterality Date  . A/V FISTULAGRAM Left 10/16/2020   Procedure: A/V FISTULAGRAM;  Surgeon: Waynetta Sandy, MD;  Location: [facility];  Service: Cardiovascular;  Laterality: Left;  . AMPUTATION TOE Left 10/07/2020   Procedure: AMPUTATION THIRD TOE;  Surgeon: Criselda Peaches, DPM;  Location: [facility];  Service: Podiatry;  Laterality: Left;  . AV FISTULA PLACEMENT Left 12/26/2017   Procedure: LEFT BRACHIOCEPHALIC ARTERIOVENOUS (AV) FISTULA CREATION;  Surgeon: Conrad [place], MD;  Location: [facility];  Service: Vascular;  Laterality: Left;  . AV FISTULA PLACEMENT Left 11/02/2020   Procedure: INSERTION OF ARTERIOVENOUS (AV) GORE-TEX GRAFT ARM;  Surgeon: Waynetta Sandy, MD;  Location: [facility];  Service: Vascular;  Laterality: Left;  . BASCILIC VEIN TRANSPOSITION Left 02/16/2018   Procedure: BRACHIOBASILIC VEIN TRANSPOSITION SECOND STAGE;  Surgeon: Conrad Newkirk, MD;  Location: [facility];  Service: Vascular;  Laterality: Left;  . CATARACT EXTRACTION W/ INTRAOCULAR LENS  IMPLANT, BILATERAL Bilateral   . ESOPHAGEAL BANDING N/A 04/01/2018   Procedure: ESOPHAGEAL BANDING;  Surgeon: Otis Brace, MD;  [facility];  Service: Gastroenterology;  Laterality:  N/A;  . ESOPHAGEAL BANDING N/A 06/29/2018   Procedure: ESOPHAGEAL BANDING;  Surgeon: Otis Brace, MD;  [facility];  Service: Gastroenterology;  Laterality: N/A;  . ESOPHAGOGASTRODUODENOSCOPY (EGD) WITH PROPOFOL N/A 04/01/2018   Procedure: ESOPHAGOGASTRODUODENOSCOPY (EGD) WITH PROPOFOL;  Surgeon: Otis Brace, MD;  [facility];  Service:  Gastroenterology;  Laterality: N/A;  . ESOPHAGOGASTRODUODENOSCOPY (EGD) WITH PROPOFOL N/A 06/29/2018   Procedure: ESOPHAGOGASTRODUODENOSCOPY (EGD) WITH PROPOFOL;  Surgeon: Otis Brace, MD;  [facility];  Service: Gastroenterology;  Laterality: N/A;  . ESOPHAGOGASTRODUODENOSCOPY (EGD) WITH PROPOFOL N/A 09/30/2018   Procedure: ESOPHAGOGASTRODUODENOSCOPY (EGD) WITH PROPOFOL;  Surgeon: Otis Brace, MD;  [facility];  Service: Gastroenterology;  Laterality: N/A;  . ESOPHAGOGASTRODUODENOSCOPY (EGD) WITH PROPOFOL N/A 04/20/2019   Procedure: ESOPHAGOGASTRODUODENOSCOPY (EGD) WITH PROPOFOL;  Surgeon: Otis Brace, MD;  [facility];  Service: Gastroenterology;  Laterality: N/A;  . ESOPHAGOGASTRODUODENOSCOPY (EGD) WITH PROPOFOL N/A 04/17/2020   Procedure: ESOPHAGOGASTRODUODENOSCOPY (EGD) WITH PROPOFOL;  Surgeon: Wonda Horner, MD;  Location: [facility];  Service: Gastroenterology;  Laterality: N/A;  . I & D EXTREMITY Left 02/18/2018   Procedure: IRRIGATION AND DEBRIDEMENT LEFT ARM;  Surgeon: Serafina Mitchell, MD;  [facility];  Service: Vascular;  Laterality: Left;  . INSERTION OF DIALYSIS CATHETER N/A 01/19/2018   Procedure: INSERTION OF DIALYSIS CATHETER - RIGHT INTERNAL JUGULAR PLACEMENT;  Surgeon: Rosetta Posner, MD;  Location: [facility] [place];  Service: Vascular;  Laterality: N/A;  . INSERTION OF DIALYSIS CATHETER Right 11/02/2020   Procedure: INSERTION OF TUNNELED DIALYSIS CATHETER;  Surgeon: Waynetta Sandy, MD;  Location: [place];  Service: Vascular;  Laterality: Right;  . IR FLUORO GUIDE CV LINE RIGHT  11/10/2017  . IR PARACENTESIS  12/10/2017  . IR PARACENTESIS  12/26/2017  . IR REMOVAL TUN CV CATH W/O FL  11/28/2017  . IR THROMBECTOMY AV FISTULA W/THROMBOLYSIS/PTA INC/SHUNT/IMG LEFT Left 06/02/2019  . IR US GUIDE VASC ACCESS LEFT  06/02/2019  . IR US GUIDE VASC ACCESS RIGHT  11/10/2017  . NECK SURGERY  2007   "back of neck; no hardware in there"  .  REVISON OF ARTERIOVENOUS FISTULA Left 11/02/2020   Procedure: REVISON OF ARTERIOVENOUS FISTULA;  Surgeon: Waynetta Sandy, MD;  Location: [facility] OR;  Service: Vascular;  Laterality: Left;       Family History  Problem Relation Age of Onset  . Diabetes Mellitus II Sister   . Diabetes Sister   . Stroke Brother   . Heart attack Brother     Social History   Tobacco Use  . Smoking status: Never Smoker  . Smokeless tobacco: Never Used  Vaping Use  . Vaping Use: Never used  Substance Use Topics  . Alcohol use: Never  . Drug use: Never    Home Medications Prior to Admission medications   Medication Sig Start Date End Date Taking? Authorizing Provider  calcium acetate (PHOSLO) 667 MG capsule Take 1 capsule (667 mg total) by mouth 3 (three) times daily with meals. 05/11/20   Aline August, MD  glucose blood (ONETOUCH VERIO) test strip USE 1 STRIP TO CHECK GLUCOSE THREE TIMES DAILY 12/19/20   Elayne Snare, MD  insulin degludec (TRESIBA FLEXTOUCH) 100 UNIT/ML FlexTouch Pen Inject 14 Units into the skin daily. Inject 14 units under the skin once daily. NO FURTHER REFILLS UNTIL PATIENT IS SEEN IN THE OFFICE Patient taking differently: Inject 12 Units into the skin daily before breakfast. 09/04/20   Elayne Snare, MD  Insulin Pen Needle (ADVOCATE INSULIN PEN NEEDLES)  31G X 5 MM MISC Use four times daily to inject insulin 04/27/18   Elayne Snare, MD  insulin regular (NOVOLIN R,HUMULIN R) 100 units/mL injection Inject 6-8 Units into the skin See admin instructions. Inject 8 units subcutaneously with breakfast, and 6 units with lunch and supper    [provider]  lactulose (CHRONULAC) 10 GM/15ML solution Take 45 mLs (30 g total) by mouth 3 (three) times daily. Patient taking differently: Take 30 g by mouth daily. 05/11/20   Aline August, MD  lidocaine-prilocaine (EMLA) cream Apply 1 application topically See admin instructions. Apply topically prior to dialysis on Tuesday, Thursday,  Saturday 08/24/18   [provider]  Methoxy PEG-Epoetin Beta (MIRCERA IJ) 1 Dose See admin instructions. Dialysis gives this medication 05/09/20 05/08/21  [provider]  Multiple Vitamin (MULTIVITAMIN WITH MINERALS) TABS tablet Take 1 tablet by mouth daily.    [provider]  mupirocin ointment (BACTROBAN) 2 % Apply 1 application topically daily. 10/26/20   McDonald, Stephan Minister, DPM  ondansetron (ZOFRAN ODT) 4 MG disintegrating tablet Take 1 tablet (4 mg total) by mouth 2 (two) times daily as needed for nausea or vomiting. 05/01/20   [name], Madelynn Done C, PA-C  ONE TOUCH LANCETS MISC Use to test blood sugar three times daily 04/27/18   Elayne Snare, MD  rifaximin (XIFAXAN) 550 MG TABS tablet Take 1 tablet (550 mg total) by mouth 2 (two) times daily. 07/07/20   Shawna Clamp, MD  rosuvastatin (CRESTOR) 5 MG tablet Take 1 tablet (5 mg total) by mouth daily. 10/18/20   Elayne Snare, MD  sevelamer carbonate (RENVELA) 800 MG tablet Take 800 mg by mouth 3 (three) times daily. 09/25/20   [provider]    Allergies    Heparin and Pork-derived products  Review of Systems   Review of Systems  Constitutional: Negative for chills and fever.  HENT: Negative for congestion and rhinorrhea.   Respiratory: Positive for shortness of breath. Negative for cough and sputum production.   Cardiovascular: Positive for chest pain. Negative for palpitations.  Gastrointestinal: Positive for abdominal pain and diarrhea (chronic and intermittent). Negative for nausea and vomiting.  Genitourinary: Negative for difficulty urinating and dysuria.  Musculoskeletal: Negative for arthralgias and back pain.  Skin: Negative for color change and rash.  Neurological: Negative for light-headedness and headaches.    Physical Exam Updated Vital Signs BP 129/68   Pulse 85   Temp 98.6 F (37 C) (Oral)   Resp 16   SpO2 99%   Physical Exam Vitals and nursing note reviewed.  Constitutional:       General: He is not in acute distress.    Appearance: Normal appearance.  HENT:     Head: Normocephalic and atraumatic.     Nose: No rhinorrhea.  Eyes:     General:        Right eye: No discharge.        Left eye: No discharge.     Conjunctiva/sclera: Conjunctivae normal.  Cardiovascular:     Rate and Rhythm: Normal rate and regular rhythm.  Pulmonary:     Effort: Pulmonary effort is normal.     Breath sounds: No stridor. Examination of the right-lower field reveals rales. Examination of the left-lower field reveals rales. Rales present.  Chest:     Chest wall: No mass or tenderness.     Comments: Port insertion site clean dry intact Abdominal:     General: Abdomen is flat. There is no distension.  Palpations: Abdomen is soft.     Tenderness: There is no abdominal tenderness. There is no rebound.  Musculoskeletal:        General: No deformity or signs of injury.     Right lower leg: Edema present.     Left lower leg: Edema present.  Skin:    General: Skin is warm and dry.  Neurological:     General: No focal deficit present.     Mental Status: He is alert. Mental status is at baseline.     Motor: No weakness.  Psychiatric:        Mood and Affect: Mood normal.        Behavior: Behavior normal.        Thought Content: Thought content normal.     ED Results / Procedures / Treatments   Labs (all labs ordered are listed, but only abnormal results are displayed) Labs Reviewed  CBC WITH DIFFERENTIAL/PLATELET - Abnormal; Notable for the following components:      Result Value   WBC 2.6 (*)    RBC 2.91 (*)    Hemoglobin 7.9 (*)    HCT 25.2 (*)    RDW 17.8 (*)    Platelets 63 (*)    Neutro Abs 1.4 (*)    All other components within normal limits  COMPREHENSIVE METABOLIC PANEL - Abnormal; Notable for the following components:   Sodium 131 (*)    Potassium 5.4 (*)    Chloride 95 (*)    Glucose, Bld 247 (*)    BUN 49 (*)    Creatinine, Ser 7.45 (*)    Calcium 7.9 (*)     Albumin 2.4 (*)    Alkaline Phosphatase 156 (*)    Total Bilirubin 1.6 (*)    GFR, Estimated 7 (*)    All other components within normal limits  BRAIN NATRIURETIC PEPTIDE - Abnormal; Notable for the following components:   B Natriuretic Peptide 3,263.1 (*)    All other components within normal limits  TROPONIN I (HIGH SENSITIVITY) - Abnormal; Notable for the following components:   Troponin I (High Sensitivity) 21 (*)    All other components within normal limits  LIPASE, BLOOD  RENAL FUNCTION PANEL  CBC  POC SARS CORONAVIRUS 2 AG -  ED    EKG EKG Interpretation  Date/Time:  Monday Dec 25 2020 10:02:53 EDT Ventricular Rate:  87 PR Interval:  160 QRS Duration: 77 QT Interval:  388 QTC Calculation: 467 R Axis:   38 Text Interpretation: Sinus rhythm Probable left atrial enlargement Confirmed by Dewaine Conger [phone]) on 12/25/2020 10:17:03 AM   Radiology DG Chest 2 View  Result Date: 12/25/2020 CLINICAL DATA:  70 year old with shortness of breath. EXAM: CHEST - 2 VIEW COMPARISON:  11/13/2020 FINDINGS: Right jugular dialysis catheter with the tip at the superior cavoatrial junction. Diffuse parenchymal lung densities bilaterally. Pattern is suggestive for pulmonary edema. Basilar densities and suspect pleural effusions. Heart size appears to be stable and within normal limits. Atherosclerotic calcifications at the aortic arch. Negative for a pneumothorax. Multiple embolization coils in the upper abdomen. Probable TIPS stent. IMPRESSION: 1. Diffuse bilateral parenchymal lung densities. Findings are suggestive for pulmonary edema. Probable small pleural effusions. 2. Dialysis catheter with tip at the superior cavoatrial junction. Electronically Signed   By: Markus Daft M.D.   On: 12/25/2020 11:20    Procedures .Critical Care E&M Performed by: Breck Coons, MD  Critical care provider statement:    Critical care was necessary to  treat or prevent imminent or life-threatening deterioration  of the following conditions:  Circulatory failure and cardiac failure (HTN emergency with volume overload)   Critical care was time spent personally by me on the following activities:  Blood draw for specimens, development of treatment plan with patient or surrogate, discussions with consultants, evaluation of patient's response to treatment, examination of patient, obtaining history from patient or surrogate, ordering and performing treatments and interventions, ordering and review of laboratory studies, re-evaluation of patient's condition and review of old charts   Care discussed with: admitting provider   After initial E/M assessment, critical care services were subsequently performed that were exclusive of separately billable procedures or treatment.       Medications Ordered in ED Medications  nitroGLYCERIN (NITROSTAT) SL tablet 0.4 mg (0.4 mg Sublingual Given 12/25/20 1032)  Chlorhexidine Gluconate Cloth 2 % PADS 6 each (has no administration in time range)  pentafluoroprop-tetrafluoroeth (GEBAUERS) aerosol 1 application (has no administration in time range)  lidocaine (PF) (XYLOCAINE) 1 % injection 5 mL (has no administration in time range)  lidocaine-prilocaine (EMLA) cream 1 application (has no administration in time range)  0.9 %  sodium chloride infusion (has no administration in time range)  0.9 %  sodium chloride infusion (has no administration in time range)  alteplase (CATHFLO ACTIVASE) injection 2 mg (has no administration in time range)  anticoagulant sodium citrate solution 5 mL (has no administration in time range)  furosemide (LASIX) 120 mg in dextrose 5 % 50 mL IVPB (120 mg Intravenous New Bag/Given 12/25/20 1403)  nitroGLYCERIN (NITROGLYN) 2 % ointment 1 inch (1 inch Topical Given 12/25/20 1403)    ED Course  I have reviewed the triage vital signs and the nursing notes.  Pertinent labs & imaging results that were available during my care of the patient were reviewed by  me and considered in my medical decision making (see chart for details).    MDM Rules/Calculators/A&P                          Patient comes with sudden onset shortness of breath.  He is hypertensive.  Will give nitroglycerin to see if there is a component of hypertension contributing to symptoms of shortness of breath.  He has some rales and trace edema in the lower extremity compliance with his medications as well as dialysis sessions.  Will get x-ray will check kidney function electrolytes hepatic function.  Will assess for need of emergent dialysis for volume overload versus outpatient management.  He is with stable vital signs on room air.  Interpreter used during my evaluation and exam  Blood pressure is improved after sublingual nitroglycerin symptoms are improved as well.  Will apply an inch of Nitropaste as well as give Lasix.  Consulted nephrology for dialysis in the setting of volume overload.  They feel that he does need it however they are full right now so they recommend IV Lasix and admission.  Shortly after the nephrology team recommend admission they called and said they may be able to get him in if we can get a rapid COVID test.  This rapid COVID test is ordered.  We will reassess the patient's need for admission afterwards.  Patient is currently getting dialysis we will reassess the need for admission after he returns.  Pt care was handed off to on coming provider at 1530.  Complete history and physical and current plan have been communicated.  Please refer to their  note for the remainder of ED care and ultimate disposition.  Pt seen in conjunction with Dr. Jeanell Sparrow   CRITICAL CARE Performed by: Breck Coons   Total critical care time: 60 minutes  Critical care time was exclusive of separately billable procedures and treating other patients.  Critical care was necessary to treat or prevent imminent or life-threatening deterioration.  Critical care was time spent personally  by me on the following activities: development of treatment plan with patient and/or surrogate as well as nursing, discussions with consultants, evaluation of patient's response to treatment, examination of patient, obtaining history from patient or surrogate, ordering and performing treatments and interventions, ordering and review of laboratory studies, ordering and review of radiographic studies, pulse oximetry and re-evaluation of patient's condition.   Final Clinical Impression(s) / ED Diagnoses Final diagnoses:  SOB (shortness of breath)  Acute pulmonary edema [facility])    Rx / DC Orders ED Discharge Orders    None       Breck Coons, MD 12/25/20 1517

## 2020-12-25 NOTE — ED Triage Notes (Signed)
Pt BIB [facility]/t son stating that he has been SOB since midnight last night. Pt was 94% on RA upon EMS arrival to scene. Son reports pt is acting normal & has productive cough. Speaks Arabic, [place] & is a dialysis pt with a Left arm restrict. VSS.

## 2020-12-25 NOTE — ED Notes (Signed)
Pt transported to dialysis

## 2020-12-26 DIAGNOSIS — N186 End stage renal disease: Secondary | ICD-10-CM | POA: Diagnosis not present

## 2020-12-26 DIAGNOSIS — Z992 Dependence on renal dialysis: Secondary | ICD-10-CM | POA: Diagnosis not present

## 2020-12-26 DIAGNOSIS — D649 Anemia, unspecified: Secondary | ICD-10-CM | POA: Diagnosis not present

## 2020-12-26 LAB — TYPE AND SCREEN
ABO/RH(D): O POS
Antibody Screen: NEGATIVE
Unit division: 0

## 2020-12-26 LAB — COMPREHENSIVE METABOLIC PANEL
ALT: 15 U/L (ref 0–44)
AST: 34 U/L (ref 15–41)
Albumin: 2 g/dL — ABNORMAL LOW (ref 3.5–5.0)
Alkaline Phosphatase: 121 U/L (ref 38–126)
Anion gap: 9 (ref 5–15)
BUN: 19 mg/dL (ref 8–23)
CO2: 27 mmol/L (ref 22–32)
Calcium: 7.6 mg/dL — ABNORMAL LOW (ref 8.9–10.3)
Chloride: 95 mmol/L — ABNORMAL LOW (ref 98–111)
Creatinine, Ser: 4.2 mg/dL — ABNORMAL HIGH (ref 0.61–1.24)
GFR, Estimated: 15 mL/min — ABNORMAL LOW (ref >60)
Glucose, Bld: 277 mg/dL — ABNORMAL HIGH (ref 70–99)
Potassium: 4.6 mmol/L (ref 3.5–5.1)
Sodium: 131 mmol/L — ABNORMAL LOW (ref 135–145)
Total Bilirubin: 1.6 mg/dL — ABNORMAL HIGH (ref 0.3–1.2)
Total Protein: 5.4 g/dL — ABNORMAL LOW (ref 6.5–8.1)

## 2020-12-26 LAB — CBC
HCT: 24.7 % — ABNORMAL LOW (ref 39.0–52.0)
Hemoglobin: 7.8 g/dL — ABNORMAL LOW (ref 13.0–17.0)
MCH: 26.8 pg (ref 26.0–34.0)
MCHC: 31.6 g/dL (ref 30.0–36.0)
MCV: 84.9 fL (ref 80.0–100.0)
Platelets: 65 10*3/uL — ABNORMAL LOW (ref 150–400)
RBC: 2.91 MIL/uL — ABNORMAL LOW (ref 4.22–5.81)
RDW: 17 % — ABNORMAL HIGH (ref 11.5–15.5)
WBC: 2.7 10*3/uL — ABNORMAL LOW (ref 4.0–10.5)
nRBC: 0 % (ref 0.0–0.2)

## 2020-12-26 LAB — BPAM RBC
Blood Product Expiration Date: 202206062359
ISSUE DATE / TIME: 202205231959
Unit Type and Rh: 5100

## 2020-12-26 LAB — GLUCOSE, CAPILLARY
Glucose-Capillary: 175 mg/dL — ABNORMAL HIGH (ref 70–99)
Glucose-Capillary: 152 mg/dL — ABNORMAL HIGH (ref 70–99)

## 2020-12-26 NOTE — Progress Notes (Signed)
Provided discharge information and packet to pt and son.

## 2020-12-26 NOTE — Discharge Summary (Signed)
Physician Discharge Summary  William [name] MBW:[id number] DOB: 03/07/1951 DOA: 12/25/2020  PCP: Horald Pollen, MD  Admit date: 12/25/2020 Discharge date: 12/26/2020  Admitted From: Home Disposition:  Home  Recommendations for Outpatient Follow-up:  1. Follow up with PCP in 2-3 weeks 2. F/u with scheduled HD  Discharge Condition:Improved CODE STATUS:Full Diet recommendation: Diabetic, renal   Brief/Interim Summary: 70 y.o. male with medical history significant for Goodpasture disease, ESRD on hemodialysis, insulin-dependent diabetes mellitus, liver cirrhosis status post TIPS, and chronic pancytopenia, now presenting to the emergency department with shortness of breath, cough, and fatigue.  Symptoms worsened significantly over the past 1 day.  Patient denies chest pain, fevers, chills, melena, hematochezia, abdominal pain, nausea, or vomiting.  Upon arrival to the ED, patient is found to be afebrile, saturating low to mid 90s on room air, and slightly tachypneic.  EKG features sinus rhythm.  Chest x-ray notable for diffuse bilateral lung densities suggestive of pulmonary edema and probable small pleural effusion.  Chemistry panel features a sodium 133, potassium 5.0, bicarbonate 25, and BUN 48.  CBC notable for stable leukopenia and thrombocytopenia, and hemoglobin 6.8, down from 7.9.  BNP was elevated to over 3000 and troponin just slightly elevated.  Patient was given 120 mg Lasix and dialyzed, had improvement in his shortness of breath with that, 1 unit of RBC was ordered for transfusion, and hospitalist asked to observe the patient overnight for transfusion.  Discharge Diagnoses:  Principal Problem:   Symptomatic anemia Active Problems:   Type II diabetes mellitus with renal manifestations (HCC)   Pancytopenia (HCC)   Hypertension   Cirrhosis of liver with ascites (HCC)   ESRD on dialysis ([place])   1. Symptomatic anemia; pancytopenia   - Patient with chronic pancytopenia  likely related to liver cirrhosis presented with SOB and fatigue, was hypervolemic and improveed with HD but noted to have Hgb 6.8 which may also be contributing to symptoms  - He denies any melena, hematochezia, abdominal pain, or N/V  - Pt was given 1 unit prbc with appropriate correction in hgb - would continue to follow cbc trends and transfuse as needed  2. ESRD  - Presented initially with volume overload and was dialyzed from ED 12/25/20  - Plan to continue hd as scheduled today  3. Cirrhosis  - Appears compensated, is s/p TIPS and on transplant list  - Continue lactulose and rifaximin    4. Type II DM  - A1c was 6.2% in March 2022  - Continued CBG checks and insulin    Discharge Instructions   Allergies as of 12/26/2020      Reactions   Heparin Other (See Comments)   Patient is Muslim and is not permitted Pork derative due to religious belief)   Pork-derived Products Other (See Comments)   Patient does not eat pork due to religious beliefs      Medication List    TAKE these medications   calcium acetate 667 MG capsule Commonly known as: PHOSLO Take 1 capsule (667 mg total) by mouth 3 (three) times daily with meals.   Insulin Pen Needle 31G X 5 MM Misc Commonly known as: Advocate Insulin Pen Needles Use four times daily to inject insulin   insulin regular 100 units/mL injection Commonly known as: NOVOLIN R Inject 6-8 Units into the skin See admin instructions. Inject 8 units subcutaneously with breakfast, and 6 units with lunch and supper   lactulose 10 GM/15ML solution Commonly known as: CHRONULAC Take 45 mLs (30  g total) by mouth 3 (three) times daily. What changed: when to take this   lidocaine-prilocaine cream Commonly known as: EMLA Apply 1 application topically See admin instructions. Apply topically prior to dialysis on Tuesday, Thursday, Saturday   MIRCERA IJ 1 Dose See admin instructions. Dialysis gives this medication   multivitamin with  minerals Tabs tablet Take 1 tablet by mouth daily.   mupirocin ointment 2 % Commonly known as: BACTROBAN Apply 1 application topically daily.   ondansetron 4 MG disintegrating tablet Commonly known as: Zofran ODT Take 1 tablet (4 mg total) by mouth 2 (two) times daily as needed for nausea or vomiting.   ONE TOUCH LANCETS Misc Use to test blood sugar three times daily   OneTouch Verio test strip Generic drug: glucose blood USE 1 STRIP TO CHECK GLUCOSE THREE TIMES DAILY   rifaximin 550 MG Tabs tablet Commonly known as: XIFAXAN Take 1 tablet (550 mg total) by mouth 2 (two) times daily.   rosuvastatin 5 MG tablet Commonly known as: CRESTOR Take 1 tablet (5 mg total) by mouth daily.   sevelamer carbonate 800 MG tablet Commonly known as: RENVELA Take 800 mg by mouth 3 (three) times daily.   [name] 100 UNIT/ML FlexTouch Pen Generic drug: insulin degludec Inject 14 Units into the skin daily. Inject 14 units under the skin once daily. NO FURTHER REFILLS UNTIL PATIENT IS SEEN IN THE OFFICE What changed:   how much to take  when to take this  additional instructions       Follow-up Information    Sagardia, Ines Bloomer, MD. Schedule an appointment as soon as possible for a visit in 2 week(s).   Specialty: Internal Medicine Contact information: [place] [address] [phone]              Allergies  Allergen Reactions  . Heparin Other (See Comments)    Patient is Muslim and is not permitted Pork derative due to religious belief)  . Pork-Derived Products Other (See Comments)    Patient does not eat pork due to religious beliefs    Procedures/Studies: DG Chest 2 View  Result Date: 12/25/2020 CLINICAL DATA:  70 year old with shortness of breath. EXAM: CHEST - 2 VIEW COMPARISON:  11/13/2020 FINDINGS: Right jugular dialysis catheter with the tip at the superior cavoatrial junction. Diffuse parenchymal lung densities bilaterally. Pattern is  suggestive for pulmonary edema. Basilar densities and suspect pleural effusions. Heart size appears to be stable and within normal limits. Atherosclerotic calcifications at the aortic arch. Negative for a pneumothorax. Multiple embolization coils in the upper abdomen. Probable TIPS stent. IMPRESSION: 1. Diffuse bilateral parenchymal lung densities. Findings are suggestive for pulmonary edema. Probable small pleural effusions. 2. Dialysis catheter with tip at the superior cavoatrial junction. Electronically Signed   By: Markus Daft M.D.   On: 12/25/2020 11:20     Subjective: Without complaints. Denies sob this AM  Discharge Exam: Vitals:   12/26/20 0520 12/26/20 0743  BP: (!) 131/56 (!) 162/75  Pulse: 79 80  Resp:  18  Temp: 99 F (37.2 C) 98.7 F (37.1 C)  SpO2: 98% 96%   Vitals:   12/25/20 2308 12/26/20 0520 12/26/20 0646 12/26/20 0743  BP: (!) 173/74 (!) 131/56  (!) 162/75  Pulse: 89 79  80  Resp:    18  Temp: 98.8 F (37.1 C) 99 F (37.2 C)  98.7 F (37.1 C)  TempSrc:    Oral  SpO2: 98% 98%  96%  Weight:  63.2 kg     General: Pt is alert, awake, not in acute distress Cardiovascular: RRR, S1/S2  Respiratory: CTA bilaterally, no wheezing, no rhonchi Abdominal: Soft, NT, ND, bowel sounds + Extremities: no edema, no cyanosis   The results of significant diagnostics from this hospitalization (including imaging, microbiology, ancillary and laboratory) are listed below for reference.     Microbiology: No results found for this or any previous visit (from the past 240 hour(s)).   Labs: BNP (last 3 results) Recent Labs    12/25/20 1017  BNP 8,921.1*   Basic Metabolic Panel: Recent Labs  Lab 12/25/20 1016 12/25/20 1412 12/26/20 0037  NA 131* 133* 131*  K 5.4* 5.0 4.6  CL 95* 98 95*  CO2 23 25 27   GLUCOSE 247* 195* 277*  BUN 49* 48* 19  CREATININE 7.45* 7.31* 4.20*  CALCIUM 7.9* 7.5* 7.6*  PHOS  --  4.9*  --    Liver Function Tests: Recent Labs  Lab  12/25/20 1016 12/25/20 1412 12/26/20 0037  AST 36  --  34  ALT 16  --  15  ALKPHOS 156*  --  121  BILITOT 1.6*  --  1.6*  PROT 6.9  --  5.4*  ALBUMIN 2.4* 2.1* 2.0*   Recent Labs  Lab 12/25/20 1016  LIPASE 38   No results for input(s): AMMONIA in the last 168 hours. CBC: Recent Labs  Lab 12/25/20 1016 12/25/20 1412 12/25/20 2010 12/26/20 0037  WBC 2.6* 2.7*  --  2.7*  NEUTROABS 1.4*  --   --   --   HGB 7.9* 6.8* 6.9* 7.8*  HCT 25.2* 21.9* 21.7* 24.7*  MCV 86.6 86.6  --  84.9  PLT 63* 62*  --  65*   Cardiac Enzymes: No results for input(s): CKTOTAL, CKMB, CKMBINDEX, TROPONINI in the last 168 hours. BNP: Invalid input(s): POCBNP CBG: Recent Labs  Lab 12/25/20 2314 12/26/20 0356 12/26/20 0731  GLUCAP 285* 175* 152*   D-Dimer No results for input(s): DDIMER in the last 72 hours. Hgb A1c No results for input(s): HGBA1C in the last 72 hours. Lipid Profile No results for input(s): CHOL, HDL, LDLCALC, TRIG, CHOLHDL, LDLDIRECT in the last 72 hours. Thyroid function studies No results for input(s): TSH, T4TOTAL, T3FREE, THYROIDAB in the last 72 hours.  Invalid input(s): FREET3 Anemia work up Recent Labs    12/25/20 2010  VITAMINB12 2,351*  FOLATE 17.1  FERRITIN 859*  TIBC 204*  IRON 45  RETICCTPCT 3.4*   Urinalysis    Component Value Date/Time   COLORURINE YELLOW 11/14/2020 0638   APPEARANCEUR HAZY (A) 11/14/2020 0638   LABSPEC 1.020 11/14/2020 0638   PHURINE 7.0 11/14/2020 0638   GLUCOSEU 250 (A) 11/14/2020 0638   HGBUR LARGE (A) 11/14/2020 0638   BILIRUBINUR SMALL (A) 11/14/2020 0638   BILIRUBINUR neg 09/14/2015 1441   KETONESUR NEGATIVE 11/14/2020 0638   PROTEINUR >300 (A) 11/14/2020 0638   UROBILINOGEN 0.2 09/14/2015 1441   UROBILINOGEN 0.2 01/27/2013 2052   NITRITE NEGATIVE 11/14/2020 0638   LEUKOCYTESUR NEGATIVE 11/14/2020 0638   Sepsis Labs Invalid input(s): PROCALCITONIN,  WBC,  LACTICIDVEN Microbiology No results found for this or  any previous visit (from the past 240 hour(s)).  Time spent: 30 min  SIGNED:   Marylu Lund, MD  [facility] 12/26/2020, 9:18 AM  If 7PM-7AM, please contact night-coverage

## 2020-12-27 DIAGNOSIS — R972 Elevated prostate specific antigen [PSA]: Secondary | ICD-10-CM | POA: Diagnosis not present

## 2021-01-03 DIAGNOSIS — E1122 Type 2 diabetes mellitus with diabetic chronic kidney disease: Secondary | ICD-10-CM | POA: Diagnosis not present

## 2021-01-03 DIAGNOSIS — Z992 Dependence on renal dialysis: Secondary | ICD-10-CM | POA: Diagnosis not present

## 2021-01-03 DIAGNOSIS — N186 End stage renal disease: Secondary | ICD-10-CM | POA: Diagnosis not present

## 2021-01-04 DIAGNOSIS — D631 Anemia in chronic kidney disease: Secondary | ICD-10-CM | POA: Diagnosis not present

## 2021-01-04 DIAGNOSIS — N2581 Secondary hyperparathyroidism of renal origin: Secondary | ICD-10-CM | POA: Diagnosis not present

## 2021-01-04 DIAGNOSIS — Z992 Dependence on renal dialysis: Secondary | ICD-10-CM | POA: Diagnosis not present

## 2021-01-04 DIAGNOSIS — D509 Iron deficiency anemia, unspecified: Secondary | ICD-10-CM | POA: Diagnosis not present

## 2021-01-04 DIAGNOSIS — N186 End stage renal disease: Secondary | ICD-10-CM | POA: Diagnosis not present

## 2021-01-08 DIAGNOSIS — K7460 Unspecified cirrhosis of liver: Secondary | ICD-10-CM | POA: Diagnosis not present

## 2021-01-08 DIAGNOSIS — E1121 Type 2 diabetes mellitus with diabetic nephropathy: Secondary | ICD-10-CM | POA: Diagnosis not present

## 2021-01-08 DIAGNOSIS — R188 Other ascites: Secondary | ICD-10-CM | POA: Diagnosis not present

## 2021-01-08 DIAGNOSIS — Z89422 Acquired absence of other left toe(s): Secondary | ICD-10-CM | POA: Diagnosis not present

## 2021-01-08 DIAGNOSIS — Z01818 Encounter for other preprocedural examination: Secondary | ICD-10-CM | POA: Diagnosis not present

## 2021-01-11 ENCOUNTER — Other Ambulatory Visit: Payer: Self-pay

## 2021-01-22 ENCOUNTER — Encounter (HOSPITAL_COMMUNITY): Payer: Self-pay

## 2021-01-23 ENCOUNTER — Encounter (HOSPITAL_COMMUNITY): Payer: Self-pay

## 2021-01-24 ENCOUNTER — Telehealth: Payer: Self-pay | Admitting: *Deleted

## 2021-01-24 NOTE — Telephone Encounter (Signed)
On 01/22/2021, faxed signed plan of care to [facility]. Contacted office, fax received.

## 2021-02-02 DIAGNOSIS — E1122 Type 2 diabetes mellitus with diabetic chronic kidney disease: Secondary | ICD-10-CM | POA: Diagnosis not present

## 2021-02-02 DIAGNOSIS — N186 End stage renal disease: Secondary | ICD-10-CM | POA: Diagnosis not present

## 2021-02-02 DIAGNOSIS — Z992 Dependence on renal dialysis: Secondary | ICD-10-CM | POA: Diagnosis not present

## 2021-02-03 DIAGNOSIS — N2581 Secondary hyperparathyroidism of renal origin: Secondary | ICD-10-CM | POA: Diagnosis not present

## 2021-02-03 DIAGNOSIS — N186 End stage renal disease: Secondary | ICD-10-CM | POA: Diagnosis not present

## 2021-02-03 DIAGNOSIS — D631 Anemia in chronic kidney disease: Secondary | ICD-10-CM | POA: Diagnosis not present

## 2021-02-03 DIAGNOSIS — Z992 Dependence on renal dialysis: Secondary | ICD-10-CM | POA: Diagnosis not present

## 2021-02-03 DIAGNOSIS — D509 Iron deficiency anemia, unspecified: Secondary | ICD-10-CM | POA: Diagnosis not present

## 2021-02-07 ENCOUNTER — Telehealth: Payer: Self-pay | Admitting: Emergency Medicine

## 2021-02-07 NOTE — Telephone Encounter (Signed)
Need signed orders  Order #[id number] Order [phone]  Fax to [phone]  Phone: [phone] (call if orders need to be re-faxed  [facility]

## 2021-02-08 NOTE — Telephone Encounter (Signed)
Called Nikki from [place] to have her refax orders for the pt. She verbalized understanding. Will call back to verify confirmation.

## 2021-02-14 NOTE — Telephone Encounter (Signed)
Called and spoke with William [name], she states that she will e-mail orders, since fax was not going through. Orders received, signed, and faxed back to [place] at [facility].

## 2021-02-16 ENCOUNTER — Other Ambulatory Visit: Payer: Self-pay

## 2021-02-16 DIAGNOSIS — N186 End stage renal disease: Secondary | ICD-10-CM

## 2021-02-21 DIAGNOSIS — I509 Heart failure, unspecified: Secondary | ICD-10-CM | POA: Diagnosis not present

## 2021-02-21 DIAGNOSIS — I8510 Secondary esophageal varices without bleeding: Secondary | ICD-10-CM | POA: Diagnosis not present

## 2021-02-21 DIAGNOSIS — Z992 Dependence on renal dialysis: Secondary | ICD-10-CM | POA: Diagnosis not present

## 2021-02-21 DIAGNOSIS — Z89422 Acquired absence of other left toe(s): Secondary | ICD-10-CM | POA: Diagnosis not present

## 2021-02-21 DIAGNOSIS — I132 Hypertensive heart and chronic kidney disease with heart failure and with stage 5 chronic kidney disease, or end stage renal disease: Secondary | ICD-10-CM | POA: Diagnosis not present

## 2021-02-21 DIAGNOSIS — I739 Peripheral vascular disease, unspecified: Secondary | ICD-10-CM | POA: Diagnosis not present

## 2021-02-21 DIAGNOSIS — K7460 Unspecified cirrhosis of liver: Secondary | ICD-10-CM | POA: Diagnosis not present

## 2021-02-21 DIAGNOSIS — K7210 Chronic hepatic failure without coma: Secondary | ICD-10-CM | POA: Diagnosis not present

## 2021-02-21 DIAGNOSIS — N186 End stage renal disease: Secondary | ICD-10-CM | POA: Diagnosis not present

## 2021-02-21 DIAGNOSIS — E1122 Type 2 diabetes mellitus with diabetic chronic kidney disease: Secondary | ICD-10-CM | POA: Diagnosis not present

## 2021-02-21 DIAGNOSIS — E1151 Type 2 diabetes mellitus with diabetic peripheral angiopathy without gangrene: Secondary | ICD-10-CM | POA: Diagnosis not present

## 2021-02-21 DIAGNOSIS — Z7682 Awaiting organ transplant status: Secondary | ICD-10-CM | POA: Diagnosis not present

## 2021-02-23 ENCOUNTER — Ambulatory Visit (INDEPENDENT_AMBULATORY_CARE_PROVIDER_SITE_OTHER): Payer: Medicare Other | Admitting: Vascular Surgery

## 2021-02-23 ENCOUNTER — Other Ambulatory Visit: Payer: Self-pay

## 2021-02-23 ENCOUNTER — Ambulatory Visit (HOSPITAL_COMMUNITY)
Admission: RE | Admit: 2021-02-23 | Discharge: 2021-02-23 | Disposition: A | Discharge status: Home / Self Care | Payer: Medicare Other | Source: Ambulatory Visit | Attending: Vascular Surgery | Admitting: Vascular Surgery

## 2021-02-23 ENCOUNTER — Encounter: Payer: Self-pay | Admitting: Vascular Surgery

## 2021-02-23 ENCOUNTER — Ambulatory Visit (INDEPENDENT_AMBULATORY_CARE_PROVIDER_SITE_OTHER)
Admission: RE | Admit: 2021-02-23 | Discharge: 2021-02-23 | Disposition: A | Discharge status: Home / Self Care | Payer: Medicare Other | Source: Ambulatory Visit | Attending: Vascular Surgery | Admitting: Vascular Surgery

## 2021-02-23 VITALS — BP 190/81 | HR 80 | Temp 97.6°F | Resp 20 | Ht 65.0 in | Wt 139.0 lb

## 2021-02-23 DIAGNOSIS — N186 End stage renal disease: Secondary | ICD-10-CM | POA: Insufficient documentation

## 2021-02-23 NOTE — Progress Notes (Signed)
Patient ID: William [name], male   DOB: [birth year], 70 y.o.   MRN: [id number]  Reason for Consult: Follow-up   Referred by Horald Pollen, *  Subjective:     HPI:  William [name] is a 70 y.o. male well-known to me with end-stage renal disease.  Most recently we converted his left upper arm AV fistula to the graft.  This is not now subsequently thrombosed he is on dialysis via catheter.  He is here today with vein mapping to plan further surgery.  He discussion with the patient and his son patient is not eager to have any surgery at this time.  He is hopeful to be put back on the list at [facility] for transplant.  He is right-hand dominant has never had surgery in the right upper extremity in the past.  Past Medical History:  Diagnosis Date   Anemia    Blind one eye    CHF (congestive heart failure) ([place])    Cirrhosis ([place])    ESRD on hemodialysis ([place])    Goodpasture's disease ([place])    on outpatient plasmapheresis/notes 11/20/2017   Grade I diastolic dysfunction 76/19/5093   History of anemia due to chronic kidney disease    History of blood transfusion X 1   "UGIB; low blood count"   History of plasmapheresis    "qod" (11/20/2017)   Hypertension    Pancytopenia ([place]) 08/20/2017   Type II diabetes mellitus ([place])    Family History  Problem Relation Age of Onset   Diabetes Mellitus II Sister    Diabetes Sister    Stroke Brother    Heart attack Brother    Past Surgical History:  Procedure Laterality Date   A/V FISTULAGRAM Left 10/16/2020   Procedure: A/V FISTULAGRAM;  Surgeon: Waynetta Sandy, MD;  Location: [place] [facility];  Service: Cardiovascular;  Laterality: Left;   AMPUTATION TOE Left 10/07/2020   Procedure: AMPUTATION THIRD TOE;  Surgeon: Criselda Peaches, DPM;  Location: [place];  Service: Podiatry;  Laterality: Left;   AV FISTULA PLACEMENT Left 12/26/2017   Procedure: LEFT BRACHIOCEPHALIC ARTERIOVENOUS (AV) FISTULA CREATION;  Surgeon: Conrad Pooler, MD;   Location: [place];  Service: Vascular;  Laterality: Left;   AV FISTULA PLACEMENT Left 11/02/2020   Procedure: INSERTION OF ARTERIOVENOUS (AV) GORE-TEX GRAFT ARM;  Surgeon: Waynetta Sandy, MD;  Location: [place];  Service: Vascular;  Laterality: Left;   BASCILIC VEIN TRANSPOSITION Left 02/16/2018   Procedure: BRACHIOBASILIC VEIN TRANSPOSITION SECOND STAGE;  Surgeon: Conrad [place], MD;  Location: [place];  Service: Vascular;  Laterality: Left;   CATARACT EXTRACTION W/ INTRAOCULAR LENS  IMPLANT, BILATERAL Bilateral    ESOPHAGEAL BANDING N/A 04/01/2018   Procedure: ESOPHAGEAL BANDING;  Surgeon: Otis Brace, MD;  Location: [facility];  Service: Gastroenterology;  Laterality: N/A;   ESOPHAGEAL BANDING N/A 06/29/2018   Procedure: ESOPHAGEAL BANDING;  Surgeon: Otis Brace, MD;  Location: [facility];  Service: Gastroenterology;  Laterality: N/A;   ESOPHAGOGASTRODUODENOSCOPY (EGD) WITH PROPOFOL N/A 04/01/2018   Procedure: ESOPHAGOGASTRODUODENOSCOPY (EGD) WITH PROPOFOL;  Surgeon: Otis Brace, MD;  [facility];  Service: Gastroenterology;  Laterality: N/A;   ESOPHAGOGASTRODUODENOSCOPY (EGD) WITH PROPOFOL N/A 06/29/2018   Procedure: ESOPHAGOGASTRODUODENOSCOPY (EGD) WITH PROPOFOL;  Surgeon: Otis Brace, MD;  [facility];  Service: Gastroenterology;  Laterality: N/A;   ESOPHAGOGASTRODUODENOSCOPY (EGD) WITH PROPOFOL N/A 09/30/2018   Procedure: ESOPHAGOGASTRODUODENOSCOPY (EGD) WITH PROPOFOL;  Surgeon: Otis Brace, MD;  [facility];  Service: Gastroenterology;  Laterality: N/A;   ESOPHAGOGASTRODUODENOSCOPY (EGD) WITH PROPOFOL N/A 04/20/2019   Procedure: ESOPHAGOGASTRODUODENOSCOPY (EGD) WITH PROPOFOL;  Surgeon: Otis Brace, MD;  [facility]: [facility];  Service: Gastroenterology;  Laterality: N/A;   ESOPHAGOGASTRODUODENOSCOPY (EGD) WITH PROPOFOL N/A 04/17/2020   Procedure: ESOPHAGOGASTRODUODENOSCOPY (EGD) WITH PROPOFOL;  Surgeon: Wonda Horner,  MD;  Location: [place];  Service: Gastroenterology;  Laterality: N/A;   I & D EXTREMITY Left 02/18/2018   Procedure: IRRIGATION AND DEBRIDEMENT LEFT ARM;  Surgeon: Serafina Mitchell, MD;  Location: [place];  Service: Vascular;  Laterality: Left;   INSERTION OF DIALYSIS CATHETER N/A 01/19/2018   Procedure: INSERTION OF DIALYSIS CATHETER - RIGHT INTERNAL JUGULAR PLACEMENT;  Surgeon: Rosetta Posner, MD;  Location: [place];  Service: Vascular;  Laterality: N/A;   INSERTION OF DIALYSIS CATHETER Right 11/02/2020   Procedure: INSERTION OF TUNNELED DIALYSIS CATHETER;  Surgeon: Waynetta Sandy, MD;  Location: [place];  Service: Vascular;  Laterality: Right;   IR FLUORO GUIDE CV LINE RIGHT  11/10/2017   IR PARACENTESIS  12/10/2017   IR PARACENTESIS  12/26/2017   IR REMOVAL TUN CV CATH W/O FL  11/28/2017   IR THROMBECTOMY AV FISTULA W/THROMBOLYSIS/PTA INC/SHUNT/IMG LEFT Left 06/02/2019   IR US GUIDE VASC ACCESS LEFT  06/02/2019   IR US GUIDE VASC ACCESS RIGHT  11/10/2017   NECK SURGERY  2007   "back of neck; no hardware in there"   [place] Left 11/02/2020   Procedure: REVISON OF ARTERIOVENOUS FISTULA;  Surgeon: Waynetta Sandy, MD;  Location: [place];  Service: Vascular;  Laterality: Left;    Short Social History:  Social History   Tobacco Use   Smoking status: Never   Smokeless tobacco: Never  Substance Use Topics   Alcohol use: Never    Allergies  Allergen Reactions   Heparin Other (See Comments)    Patient is Muslim and is not permitted Pork derative due to religious belief)   Pork-Derived Products Other (See Comments)    Patient does not eat pork due to religious beliefs    Current Outpatient Medications  Medication Sig Dispense Refill   calcium acetate (PHOSLO) 667 MG capsule Take 1 capsule (667 mg total) by mouth 3 (three) times daily with meals.     glucose blood (ONETOUCH VERIO) test strip USE 1 STRIP TO CHECK GLUCOSE THREE TIMES DAILY 150 each 3    insulin degludec (TRESIBA FLEXTOUCH) 100 UNIT/ML FlexTouch Pen Inject 14 Units into the skin daily. Inject 14 units under the skin once daily. NO FURTHER REFILLS UNTIL PATIENT IS SEEN IN THE OFFICE (Patient taking differently: Inject 12 Units into the skin daily before breakfast.) 2 mL 0   Insulin Pen Needle (ADVOCATE INSULIN PEN NEEDLES) 31G X 5 MM MISC Use four times daily to inject insulin 150 each 1   insulin regular (NOVOLIN R,HUMULIN R) 100 units/mL injection Inject 6-8 Units into the skin See admin instructions. Inject 8 units subcutaneously with breakfast, and 6 units with lunch and supper     lactulose (CHRONULAC) 10 GM/15ML solution Take 45 mLs (30 g total) by mouth 3 (three) times daily. (Patient taking differently: Take 30 g by mouth daily.) 946 mL 1   lidocaine-prilocaine (EMLA) cream Apply 1 application topically See admin instructions. Apply topically prior to dialysis on Tuesday, Thursday, Saturday     Methoxy PEG-Epoetin Beta (MIRCERA IJ) 1 Dose See admin instructions. Dialysis gives this medication     Multiple Vitamin (MULTIVITAMIN WITH MINERALS) TABS tablet Take  1 tablet by mouth daily.     mupirocin ointment (BACTROBAN) 2 % Apply 1 application topically daily. 30 g 2   ondansetron (ZOFRAN ODT) 4 MG disintegrating tablet Take 1 tablet (4 mg total) by mouth 2 (two) times daily as needed for nausea or vomiting. 20 tablet 0   ONE TOUCH LANCETS MISC Use to test blood sugar three times daily 100 each 2   rifaximin (XIFAXAN) 550 MG TABS tablet Take 1 tablet (550 mg total) by mouth 2 (two) times daily. 60 tablet 1   rosuvastatin (CRESTOR) 5 MG tablet Take 1 tablet (5 mg total) by mouth daily. 90 tablet 0   sevelamer carbonate (RENVELA) 800 MG tablet Take 800 mg by mouth 3 (three) times daily.     No current facility-administered medications for this visit.    Review of Systems  Constitutional:  Constitutional negative. HENT: HENT negative.  Eyes: Eyes negative.  Respiratory:  Respiratory negative.  Cardiovascular: Cardiovascular negative.  GI: Gastrointestinal negative.  Musculoskeletal: Musculoskeletal negative.  Skin: Skin negative.  Neurological: Neurological negative. Hematologic: Hematologic/lymphatic negative.  Psychiatric: Psychiatric negative.       Objective:  Objective   Vitals:   02/23/21 1138  BP: (!) 190/81  Pulse: 80  Resp: 20  Temp: 97.6 F (36.4 C)  SpO2: 100%  Weight: 139 lb (63 kg)  Height: 5' 5"  (1.651 m)   Body mass index is 23.13 kg/m.  Physical Exam Constitutional:      Appearance: Normal appearance.  HENT:     Head: Normocephalic.     Nose:     Comments: Wearing a mask Eyes:     Pupils: Pupils are equal, round, and reactive to light.  Cardiovascular:     Rate and Rhythm: Normal rate.     Pulses:          Radial pulses are 2+ on the right side and 2+ on the left side.  Pulmonary:     Effort: Pulmonary effort is normal.  Abdominal:     General: Abdomen is flat.  Musculoskeletal:        General: Normal range of motion.     Right lower leg: No edema.     Comments: Left upper arm access without thrill  Skin:    General: Skin is warm.     Capillary Refill: Capillary refill takes less than 2 seconds.  Neurological:     General: No focal deficit present.     Mental Status: He is alert.  Psychiatric:        Mood and Affect: Mood normal.        Behavior: Behavior normal.    Data: +-----------------+-------------+----------+--------+  Right Cephalic   Diameter (cm)Depth (cm)Findings  +-----------------+-------------+----------+--------+  Shoulder             0.13        0.25             +-----------------+-------------+----------+--------+  Prox upper arm       0.13        0.25             +-----------------+-------------+----------+--------+  Mid upper arm        0.11        0.21             +-----------------+-------------+----------+--------+  Dist upper arm       0.12        0.37              +-----------------+-------------+----------+--------+  Antecubital fossa    0.13        0.28             +-----------------+-------------+----------+--------+  Prox forearm         0.10        0.18             +-----------------+-------------+----------+--------+  Mid forearm          0.15        0.25             +-----------------+-------------+----------+--------+  Dist forearm         0.11        0.18             +-----------------+-------------+----------+--------+  Wrist                0.08        0.19             +-----------------+-------------+----------+--------+   +-----------------+-------------+----------+---------+  Right Basilic    Diameter (cm)Depth (cm)Findings   +-----------------+-------------+----------+---------+  Prox upper arm       0.22        1.15              +-----------------+-------------+----------+---------+  Mid upper arm        0.27        1.02              +-----------------+-------------+----------+---------+  Dist upper arm       0.37        0.95              +-----------------+-------------+----------+---------+  Antecubital fossa    0.21        0.86   branching  +-----------------+-------------+----------+---------+  Prox forearm         0.20        0.23   branching  +-----------------+-------------+----------+---------+  Mid forearm          0.21        0.19              +-----------------+-------------+----------+---------+  Distal forearm       0.15        0.27              +-----------------+-------------+----------+---------+  Wrist                0.19        0.22              +-----------------+-------------+----------+---------+   +-----------------+-------------+----------+---------+  Left Cephalic    Diameter (cm)Depth (cm)Findings   +-----------------+-------------+----------+---------+  Shoulder             0.19        0.19               +-----------------+-------------+----------+---------+  Prox upper arm       0.16        0.16              +-----------------+-------------+----------+---------+  Mid upper arm        0.11        0.25   branching  +-----------------+-------------+----------+---------+  Dist upper arm       0.10        0.19              +-----------------+-------------+----------+---------+  Antecubital fossa    0.11        0.21              +-----------------+-------------+----------+---------+  Prox forearm         0.13        0.20              +-----------------+-------------+----------+---------+  Mid forearm          0.16        0.21              +-----------------+-------------+----------+---------+  Dist forearm         0.17        0.18              +-----------------+-------------+----------+---------+  Wrist                0.15        0.21              +-----------------+-------------+----------+---------+        Assessment/Plan:     70 year old male with end-stage renal disease currently dialyzing via catheter.  He has a previous left upper extremity fistula which was converted to graft.  He does have a marginal basilic vein on the right for access creation.  I have offered him basilic vein fistula versus graft at this time the patient is hopeful to avoid any further access and does not want to schedule surgery today.  They are going to talk with [facility] and if they agree to proceed we can schedule without further evaluation for right upper extremity fistula which would likely be basilic vein in 2 stages versus graft.  Patient and his son demonstrate good understanding of our conversation including the risk and benefits of surgery today.    Waynetta Sandy MD [facility]

## 2021-03-05 DIAGNOSIS — Z992 Dependence on renal dialysis: Secondary | ICD-10-CM | POA: Diagnosis not present

## 2021-03-05 DIAGNOSIS — E1122 Type 2 diabetes mellitus with diabetic chronic kidney disease: Secondary | ICD-10-CM | POA: Diagnosis not present

## 2021-03-05 DIAGNOSIS — N186 End stage renal disease: Secondary | ICD-10-CM | POA: Diagnosis not present

## 2021-03-06 DIAGNOSIS — N2581 Secondary hyperparathyroidism of renal origin: Secondary | ICD-10-CM | POA: Diagnosis not present

## 2021-03-06 DIAGNOSIS — D631 Anemia in chronic kidney disease: Secondary | ICD-10-CM | POA: Diagnosis not present

## 2021-03-06 DIAGNOSIS — Z992 Dependence on renal dialysis: Secondary | ICD-10-CM | POA: Diagnosis not present

## 2021-03-06 DIAGNOSIS — N186 End stage renal disease: Secondary | ICD-10-CM | POA: Diagnosis not present

## 2021-03-06 DIAGNOSIS — D509 Iron deficiency anemia, unspecified: Secondary | ICD-10-CM | POA: Diagnosis not present

## 2021-04-01 ENCOUNTER — Other Ambulatory Visit: Payer: Self-pay | Admitting: Endocrinology

## 2021-04-05 DIAGNOSIS — D509 Iron deficiency anemia, unspecified: Secondary | ICD-10-CM | POA: Diagnosis not present

## 2021-04-05 DIAGNOSIS — E1122 Type 2 diabetes mellitus with diabetic chronic kidney disease: Secondary | ICD-10-CM | POA: Diagnosis not present

## 2021-04-05 DIAGNOSIS — N2581 Secondary hyperparathyroidism of renal origin: Secondary | ICD-10-CM | POA: Diagnosis not present

## 2021-04-05 DIAGNOSIS — Z992 Dependence on renal dialysis: Secondary | ICD-10-CM | POA: Diagnosis not present

## 2021-04-05 DIAGNOSIS — D631 Anemia in chronic kidney disease: Secondary | ICD-10-CM | POA: Diagnosis not present

## 2021-04-05 DIAGNOSIS — N186 End stage renal disease: Secondary | ICD-10-CM | POA: Diagnosis not present

## 2021-04-16 ENCOUNTER — Other Ambulatory Visit: Payer: Self-pay | Admitting: Endocrinology

## 2021-04-18 DIAGNOSIS — R54 Age-related physical debility: Secondary | ICD-10-CM | POA: Diagnosis not present

## 2021-04-18 DIAGNOSIS — R059 Cough, unspecified: Secondary | ICD-10-CM | POA: Diagnosis not present

## 2021-04-18 DIAGNOSIS — K7460 Unspecified cirrhosis of liver: Secondary | ICD-10-CM | POA: Diagnosis not present

## 2021-04-18 DIAGNOSIS — J811 Chronic pulmonary edema: Secondary | ICD-10-CM | POA: Diagnosis not present

## 2021-04-18 DIAGNOSIS — E1122 Type 2 diabetes mellitus with diabetic chronic kidney disease: Secondary | ICD-10-CM | POA: Diagnosis not present

## 2021-04-18 DIAGNOSIS — N186 End stage renal disease: Secondary | ICD-10-CM | POA: Diagnosis not present

## 2021-04-18 DIAGNOSIS — N400 Enlarged prostate without lower urinary tract symptoms: Secondary | ICD-10-CM | POA: Diagnosis not present

## 2021-04-18 DIAGNOSIS — Z9889 Other specified postprocedural states: Secondary | ICD-10-CM | POA: Diagnosis not present

## 2021-04-18 DIAGNOSIS — I509 Heart failure, unspecified: Secondary | ICD-10-CM | POA: Diagnosis not present

## 2021-04-18 DIAGNOSIS — I132 Hypertensive heart and chronic kidney disease with heart failure and with stage 5 chronic kidney disease, or end stage renal disease: Secondary | ICD-10-CM | POA: Diagnosis not present

## 2021-04-18 DIAGNOSIS — D696 Thrombocytopenia, unspecified: Secondary | ICD-10-CM | POA: Diagnosis not present

## 2021-04-18 DIAGNOSIS — R188 Other ascites: Secondary | ICD-10-CM | POA: Diagnosis not present

## 2021-04-18 DIAGNOSIS — J90 Pleural effusion, not elsewhere classified: Secondary | ICD-10-CM | POA: Diagnosis not present

## 2021-04-25 DIAGNOSIS — R791 Abnormal coagulation profile: Secondary | ICD-10-CM | POA: Diagnosis not present

## 2021-04-25 DIAGNOSIS — Z01818 Encounter for other preprocedural examination: Secondary | ICD-10-CM | POA: Diagnosis not present

## 2021-04-30 DIAGNOSIS — R52 Pain, unspecified: Secondary | ICD-10-CM | POA: Insufficient documentation

## 2021-05-05 DIAGNOSIS — E1122 Type 2 diabetes mellitus with diabetic chronic kidney disease: Secondary | ICD-10-CM | POA: Diagnosis not present

## 2021-05-05 DIAGNOSIS — D509 Iron deficiency anemia, unspecified: Secondary | ICD-10-CM | POA: Diagnosis not present

## 2021-05-05 DIAGNOSIS — N2581 Secondary hyperparathyroidism of renal origin: Secondary | ICD-10-CM | POA: Diagnosis not present

## 2021-05-05 DIAGNOSIS — N186 End stage renal disease: Secondary | ICD-10-CM | POA: Diagnosis not present

## 2021-05-05 DIAGNOSIS — Z992 Dependence on renal dialysis: Secondary | ICD-10-CM | POA: Diagnosis not present

## 2021-05-05 DIAGNOSIS — D631 Anemia in chronic kidney disease: Secondary | ICD-10-CM | POA: Diagnosis not present

## 2021-05-08 ENCOUNTER — Telehealth: Payer: Self-pay

## 2021-05-08 NOTE — Telephone Encounter (Signed)
Received referral from Dr. Moshe Cipro requesting new access - To [facility] to schedule

## 2021-05-10 ENCOUNTER — Other Ambulatory Visit: Payer: Self-pay

## 2021-05-28 DIAGNOSIS — Z01818 Encounter for other preprocedural examination: Secondary | ICD-10-CM | POA: Diagnosis not present

## 2021-05-28 DIAGNOSIS — R791 Abnormal coagulation profile: Secondary | ICD-10-CM | POA: Diagnosis not present

## 2021-05-30 ENCOUNTER — Encounter: Payer: Self-pay | Admitting: Emergency Medicine

## 2021-05-30 ENCOUNTER — Other Ambulatory Visit: Payer: Self-pay

## 2021-05-30 ENCOUNTER — Ambulatory Visit (INDEPENDENT_AMBULATORY_CARE_PROVIDER_SITE_OTHER): Payer: Medicare Other | Admitting: Emergency Medicine

## 2021-05-30 VITALS — BP 136/70 | HR 83 | Temp 98.4°F | Ht 65.0 in | Wt 140.0 lb

## 2021-05-30 DIAGNOSIS — E1121 Type 2 diabetes mellitus with diabetic nephropathy: Secondary | ICD-10-CM | POA: Diagnosis not present

## 2021-05-30 DIAGNOSIS — Z794 Long term (current) use of insulin: Secondary | ICD-10-CM

## 2021-05-30 DIAGNOSIS — N186 End stage renal disease: Secondary | ICD-10-CM

## 2021-05-30 DIAGNOSIS — Z992 Dependence on renal dialysis: Secondary | ICD-10-CM

## 2021-05-30 LAB — POCT GLYCOSYLATED HEMOGLOBIN (HGB A1C): Hemoglobin A1C: 7.4 % — AB (ref 4.0–5.6)

## 2021-05-30 MED ORDER — MUPIROCIN 2 % EX OINT: 1.0000 "application " | TOPICAL_OINTMENT | Freq: Every day | CUTANEOUS | 2 refills | Status: AC

## 2021-05-30 MED ORDER — TRESIBA FLEXTOUCH 100 UNIT/ML ~~LOC~~ SOPN: 14.0000 [IU] | PEN_INJECTOR | Freq: Every day | SUBCUTANEOUS | 0 refills | Status: DC

## 2021-05-30 NOTE — Patient Instructions (Signed)
Diabetes Mellitus and Nutrition, Adult When you have diabetes, or diabetes mellitus, it is very important to have healthy eating habits because your blood sugar (glucose) levels are greatly affected by what you eat and drink. Eating healthy foods in the right amounts, at about the same times every day, can help you:  Control your blood glucose.  Lower your risk of heart disease.  Improve your blood pressure.  Reach or maintain a healthy weight. What can affect my meal plan? Every person with diabetes is different, and each person has different needs for a meal plan. Your health care provider may recommend that you work with a dietitian to make a meal plan that is best for you. Your meal plan may vary depending on factors such as:  The calories you need.  The medicines you take.  Your weight.  Your blood glucose, blood pressure, and cholesterol levels.  Your activity level.  Other health conditions you have, such as heart or kidney disease. How do carbohydrates affect me? Carbohydrates, also called carbs, affect your blood glucose level more than any other type of food. Eating carbs naturally raises the amount of glucose in your blood. Carb counting is a method for keeping track of how many carbs you eat. Counting carbs is important to keep your blood glucose at a healthy level, especially if you use insulin or take certain oral diabetes medicines. It is important to know how many carbs you can safely have in each meal. This is different for every person. Your dietitian can help you calculate how many carbs you should have at each meal and for each snack. How does alcohol affect me? Alcohol can cause a sudden decrease in blood glucose (hypoglycemia), especially if you use insulin or take certain oral diabetes medicines. Hypoglycemia can be a life-threatening condition. Symptoms of hypoglycemia, such as sleepiness, dizziness, and confusion, are similar to symptoms of having too much  alcohol.  Do not drink alcohol if: ? Your health care provider tells you not to drink. ? You are pregnant, may be pregnant, or are planning to become pregnant.  If you drink alcohol: ? Do not drink on an empty stomach. ? Limit how much you use to:  0-1 drink a day for women.  0-2 drinks a day for men. ? Be aware of how much alcohol is in your drink. In the [place] one drink equals one 12 oz bottle of beer (355 mL), one 5 oz glass of wine (148 mL), or one 1 oz glass of hard liquor (44 mL). ? Keep yourself hydrated with water, diet soda, or unsweetened iced tea.  Keep in mind that regular soda, juice, and other mixers may contain a lot of sugar and must be counted as carbs. What are tips for following this plan? Reading food labels  Start by checking the serving size on the "Nutrition Facts" label of packaged foods and drinks. The amount of calories, carbs, fats, and other nutrients listed on the label is based on one serving of the item. Many items contain more than one serving per package.  Check the total grams (g) of carbs in one serving. You can calculate the number of servings of carbs in one serving by dividing the total carbs by 15. For example, if a food has 30 g of total carbs per serving, it would be equal to 2 servings of carbs.  Check the number of grams (g) of saturated fats and trans fats in one serving. Choose foods that have   a low amount or none of these fats.  Check the number of milligrams (mg) of salt (sodium) in one serving. Most people should limit total sodium intake to less than 2,300 mg per day.  Always check the nutrition information of foods labeled as "low-fat" or "nonfat." These foods may be higher in added sugar or refined carbs and should be avoided.  Talk to your dietitian to identify your daily goals for nutrients listed on the label. Shopping  Avoid buying canned, pre-made, or processed foods. These foods tend to be high in fat, sodium, and added  sugar.  Shop around the outside edge of the grocery store. This is where you will most often find fresh fruits and vegetables, bulk grains, fresh meats, and fresh dairy. Cooking  Use low-heat cooking methods, such as baking, instead of high-heat cooking methods like deep frying.  Cook using healthy oils, such as olive, canola, or sunflower oil.  Avoid cooking with butter, cream, or high-fat meats. Meal planning  Eat meals and snacks regularly, preferably at the same times every day. Avoid going long periods of time without eating.  Eat foods that are high in fiber, such as fresh fruits, vegetables, beans, and whole grains. Talk with your dietitian about how many servings of carbs you can eat at each meal.  Eat 4-6 oz (112-168 g) of lean protein each day, such as lean meat, chicken, fish, eggs, or tofu. One ounce (oz) of lean protein is equal to: ? 1 oz (28 g) of meat, chicken, or fish. ? 1 egg. ?  cup (62 g) of tofu.  Eat some foods each day that contain healthy fats, such as avocado, nuts, seeds, and fish.   What foods should I eat? Fruits Berries. Apples. Oranges. Peaches. Apricots. Plums. Grapes. Mango. Papaya. Pomegranate. Kiwi. Cherries. Vegetables Lettuce. Spinach. Leafy greens, including kale, chard, collard greens, and mustard greens. Beets. Cauliflower. Cabbage. Broccoli. Carrots. Green beans. Tomatoes. Peppers. Onions. Cucumbers. Brussels sprouts. Grains Whole grains, such as whole-wheat or whole-grain bread, crackers, tortillas, cereal, and pasta. Unsweetened oatmeal. Quinoa. Brown or wild rice. Meats and other proteins Seafood. Poultry without skin. Lean cuts of poultry and beef. Tofu. Nuts. Seeds. Dairy Low-fat or fat-free dairy products such as milk, yogurt, and cheese. The items listed above may not be a complete list of foods and beverages you can eat. Contact a dietitian for more information. What foods should I avoid? Fruits Fruits canned with  syrup. Vegetables Canned vegetables. Frozen vegetables with butter or cream sauce. Grains Refined white flour and flour products such as bread, pasta, snack foods, and cereals. Avoid all processed foods. Meats and other proteins Fatty cuts of meat. Poultry with skin. Breaded or fried meats. Processed meat. Avoid saturated fats. Dairy Full-fat yogurt, cheese, or milk. Beverages Sweetened drinks, such as soda or iced tea. The items listed above may not be a complete list of foods and beverages you should avoid. Contact a dietitian for more information. Questions to ask a health care provider  Do I need to meet with a diabetes educator?  Do I need to meet with a dietitian?  What number can I call if I have questions?  When are the best times to check my blood glucose? Where to find more information:  American Diabetes Association: diabetes.org  Academy of Nutrition and Dietetics: www.eatright.org  National Institute of Diabetes and Digestive and Kidney Diseases: www.niddk.nih.gov  Association of Diabetes Care and Education Specialists: www.diabeteseducator.org Summary  It is important to have healthy eating   habits because your blood sugar (glucose) levels are greatly affected by what you eat and drink.  A healthy meal plan will help you control your blood glucose and maintain a healthy lifestyle.  Your health care provider may recommend that you work with a dietitian to make a meal plan that is best for you.  Keep in mind that carbohydrates (carbs) and alcohol have immediate effects on your blood glucose levels. It is important to count carbs and to use alcohol carefully. This information is not intended to replace advice given to you by your health care provider. Make sure you discuss any questions you have with your health care provider. Document Revised: 06/29/2019 Document Reviewed: 06/29/2019 [employer] Patient Education  2021 [employer]  

## 2021-05-30 NOTE — Progress Notes (Signed)
William [name] 70 y.o.   Chief Complaint  Patient presents with   Follow-up    6 month F/U on chronic conditions.    HISTORY OF PRESENT ILLNESS: This is a 70 y.o. male here for follow-up on chronic medical conditions. #1 end-stage renal disease on dialysis #2 liver cirrhosis #3 diabetes #4 hypertension  HPI   Prior to Admission medications   Medication Sig Start Date End Date Taking? Authorizing Provider  acyclovir (ZOVIRAX) 200 MG capsule Take by mouth. 05/10/21  Yes [provider]  calcium acetate (PHOSLO) 667 MG tablet Take 667 mg by mouth 3 (three) times daily. 05/09/21  Yes [provider]  glucose blood (ONETOUCH VERIO) test strip USE 1 STRIP TO CHECK GLUCOSE THREE TIMES DAILY 12/19/20  Yes Elayne Snare, MD  insulin degludec (TRESIBA FLEXTOUCH) 100 UNIT/ML FlexTouch Pen Inject 14 Units into the skin daily. Inject 14 units under the skin once daily. NO FURTHER REFILLS UNTIL PATIENT IS SEEN IN THE OFFICE Patient taking differently: Inject 12 Units into the skin daily before breakfast. 09/04/20  Yes Elayne Snare, MD  Insulin Pen Needle (ADVOCATE INSULIN PEN NEEDLES) 31G X 5 MM MISC Use four times daily to inject insulin 04/27/18  Yes Elayne Snare, MD  insulin regular (NOVOLIN R,HUMULIN R) 100 units/mL injection Inject 6-8 Units into the skin See admin instructions. Inject 8 units subcutaneously with breakfast, and 6 units with lunch and supper   Yes [provider]  lactulose (CHRONULAC) 10 GM/15ML solution Take 45 mLs (30 g total) by mouth 3 (three) times daily. Patient taking differently: Take 30 g by mouth daily. 05/11/20  Yes Aline August, MD  lidocaine-prilocaine (EMLA) cream Apply 1 application topically See admin instructions. Apply topically prior to dialysis on Tuesday, Thursday, Saturday 08/24/18  Yes [provider]  Multiple Vitamin (MULTIVITAMIN WITH MINERALS) TABS tablet Take 1 tablet by mouth daily.   Yes [provider]   mupirocin ointment (BACTROBAN) 2 % Apply 1 application topically daily. 10/26/20  Yes McDonald, Adam R, DPM  ondansetron (ZOFRAN ODT) 4 MG disintegrating tablet Take 1 tablet (4 mg total) by mouth 2 (two) times daily as needed for nausea or vomiting. 05/01/20  Yes [name], [place] C, PA-C  ONE TOUCH LANCETS MISC Use to test blood sugar three times daily 04/27/18  Yes Elayne Snare, MD  rifaximin (XIFAXAN) 550 MG TABS tablet Take 1 tablet (550 mg total) by mouth 2 (two) times daily. 07/07/20  Yes Shawna Clamp, MD  rosuvastatin (CRESTOR) 5 MG tablet Take 1 tablet (5 mg total) by mouth daily. 10/18/20  Yes Elayne Snare, MD  sevelamer carbonate (RENVELA) 800 MG tablet Take 800 mg by mouth 3 (three) times daily. 09/25/20  Yes [provider]    Allergies  Allergen Reactions   Heparin Other (See Comments)    Patient is Muslim and is not permitted Pork derative due to religious belief)   Pork-Derived Products Other (See Comments)    Patient does not eat pork due to religious beliefs    Patient Active Problem List   Diagnosis Date Noted   Pain, unspecified 04/30/2021   Symptomatic anemia 12/25/2020   Acute pulmonary edema ([place])    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   Elevated PSA 12/14/2020   Hyperammonemia ([place]) 11/29/2020   FUO (fever of unknown origin) 11/13/2020   Muscle weakness (generalized) 11/13/2020   Repeated falls 11/13/2020   Hepatic encephalopathy 11/08/2020   Encephalopathy, unspecified 11/08/2020   Coagulation defect, unspecified ([place]) 11/06/2020   AMS (  altered mental status) 11/03/2020   Delirium due to known physiological condition 11/03/2020   Other microscopic hematuria 11/03/2020   Traumatic amputation of third toe of left foot ([place]) 10/15/2020   Diabetic foot infection ([place]) 10/06/2020   Gangrene of toe of left foot ([place])    Perforation of right tympanic membrane 08/28/2020   Hepatic failure, unspecified without coma ([place]) 05/08/2020   Goals of care, counseling/discussion     Palliative care by specialist    Thrombotic microangiopathy, unspecified ([place]) 05/02/2020   Anuria 04/27/2020   Awaiting organ transplant 04/27/2020   Physical deconditioning 04/27/2020   S/P TIPS (transjugular intrahepatic portosystemic shunt) 04/27/2020   Urine culture positive 04/21/2020   Allergy, unspecified, initial encounter 03/06/2020   Anaphylactic shock, unspecified, initial encounter 03/06/2020   Presbycusis of both ears 09/24/2019   Chronic tubotympanic suppurative otitis media of right ear 08/25/2019   AV fistula thrombosis, initial encounter ([place]) 06/01/2019   AV fistula occlusion, initial encounter ([place])    Chronic systolic CHF (congestive heart failure) (HCC)    Esophageal varices ([place]) 09/23/2018   Thrombocytopenia ([place]) 09/23/2018   CHF (congestive heart failure) ([place]) 09/22/2018   ESRD on dialysis ([place]) 02/06/2018   Mild protein-calorie malnutrition ([place]) 02/04/2018   Anemia in chronic kidney disease 01/22/2018   Iron deficiency anemia, unspecified 01/22/2018   Secondary hyperparathyroidism of renal origin ([place]) 01/22/2018   CKD (chronic kidney disease) stage 4, GFR 15-29 ml/min ([place]) 12/19/2017   Cirrhosis of liver with ascites ([place]) 12/09/2017   Goodpasture's syndrome ([place]) with associated hemoptysis 11/20/2017   Pancytopenia ([place]) 11/06/2017   Hypertension 11/06/2017   Type II diabetes mellitus with renal manifestations ([place]) 08/25/2015    Past Medical History:  Diagnosis Date   Anemia    Blind one eye    CHF (congestive heart failure) ([place])    Cirrhosis ([employer])    ESRD on hemodialysis ([place])    Goodpasture's disease ([place])    on outpatient plasmapheresis/notes 11/20/2017   Grade I diastolic dysfunction 51/09/5850   History of anemia due to chronic kidney disease    History of blood transfusion X 1   "UGIB; low blood count"   History of plasmapheresis    "qod" (11/20/2017)   Hypertension    Pancytopenia ([place]) 08/20/2017   Type II diabetes mellitus  ([place])     Past Surgical History:  Procedure Laterality Date   A/V FISTULAGRAM Left 10/16/2020   Procedure: A/V FISTULAGRAM;  Surgeon: Waynetta Sandy, MD;  Location: [facility];  Service: Cardiovascular;  Laterality: Left;   AMPUTATION TOE Left 10/07/2020   Procedure: AMPUTATION THIRD TOE;  Surgeon: Criselda Peaches, DPM;  Location: [facility];  Service: Podiatry;  Laterality: Left;   AV FISTULA PLACEMENT Left 12/26/2017   Procedure: LEFT BRACHIOCEPHALIC ARTERIOVENOUS (AV) FISTULA CREATION;  Surgeon: Conrad Ione, MD;  Location: [facility];  Service: Vascular;  Laterality: Left;   AV FISTULA PLACEMENT Left 11/02/2020   Procedure: INSERTION OF ARTERIOVENOUS (AV) GORE-TEX GRAFT ARM;  Surgeon: Waynetta Sandy, MD;  Location: [facility];  Service: Vascular;  Laterality: Left;   BASCILIC VEIN TRANSPOSITION Left 02/16/2018   Procedure: BRACHIOBASILIC VEIN TRANSPOSITION SECOND STAGE;  Surgeon: Conrad [place], MD;  Location: [facility];  Service: Vascular;  Laterality: Left;   CATARACT EXTRACTION W/ INTRAOCULAR LENS  IMPLANT, BILATERAL Bilateral    ESOPHAGEAL BANDING N/A 04/01/2018   Procedure: ESOPHAGEAL BANDING;  Surgeon: Otis Brace, MD;  [facility];  Service: Gastroenterology;  Laterality: N/A;  ESOPHAGEAL BANDING N/A 06/29/2018   Procedure: ESOPHAGEAL BANDING;  Surgeon: Otis Brace, MD;  [facility];  Service: Gastroenterology;  Laterality: N/A;   ESOPHAGOGASTRODUODENOSCOPY (EGD) WITH PROPOFOL N/A 04/01/2018   Procedure: ESOPHAGOGASTRODUODENOSCOPY (EGD) WITH PROPOFOL;  Surgeon: Otis Brace, MD;  [facility];  Service: Gastroenterology;  Laterality: N/A;   ESOPHAGOGASTRODUODENOSCOPY (EGD) WITH PROPOFOL N/A 06/29/2018   Procedure: ESOPHAGOGASTRODUODENOSCOPY (EGD) WITH PROPOFOL;  Surgeon: Otis Brace, MD;  [facility];  Service: Gastroenterology;  Laterality: N/A;   ESOPHAGOGASTRODUODENOSCOPY (EGD) WITH PROPOFOL N/A 09/30/2018    Procedure: ESOPHAGOGASTRODUODENOSCOPY (EGD) WITH PROPOFOL;  Surgeon: Otis Brace, MD;  [facility];  Service: Gastroenterology;  Laterality: N/A;   ESOPHAGOGASTRODUODENOSCOPY (EGD) WITH PROPOFOL N/A 04/20/2019   Procedure: ESOPHAGOGASTRODUODENOSCOPY (EGD) WITH PROPOFOL;  Surgeon: Otis Brace, MD;  [facility];  Service: Gastroenterology;  Laterality: N/A;   ESOPHAGOGASTRODUODENOSCOPY (EGD) WITH PROPOFOL N/A 04/17/2020   Procedure: ESOPHAGOGASTRODUODENOSCOPY (EGD) WITH PROPOFOL;  Surgeon: Wonda Horner, MD;  Location: [facility];  Service: Gastroenterology;  Laterality: N/A;   I & D EXTREMITY Left 02/18/2018   Procedure: IRRIGATION AND DEBRIDEMENT LEFT ARM;  Surgeon: Serafina Mitchell, MD;  Location: [facility];  Service: Vascular;  Laterality: Left;   INSERTION OF DIALYSIS CATHETER N/A 01/19/2018   Procedure: INSERTION OF DIALYSIS CATHETER - RIGHT INTERNAL JUGULAR PLACEMENT;  Surgeon: Rosetta Posner, MD;  Location: [facility];  Service: Vascular;  Laterality: N/A;   INSERTION OF DIALYSIS CATHETER Right 11/02/2020   Procedure: INSERTION OF TUNNELED DIALYSIS CATHETER;  Surgeon: Waynetta Sandy, MD;  Location: [facility];  Service: Vascular;  Laterality: Right;   IR FLUORO GUIDE CV LINE RIGHT  11/10/2017   IR PARACENTESIS  12/10/2017   IR PARACENTESIS  12/26/2017   IR REMOVAL TUN CV CATH W/O FL  11/28/2017   IR THROMBECTOMY AV FISTULA W/THROMBOLYSIS/PTA INC/SHUNT/IMG LEFT Left 06/02/2019   IR US GUIDE VASC ACCESS LEFT  06/02/2019   IR US GUIDE VASC ACCESS RIGHT  11/10/2017   NECK SURGERY  2007   "back of neck; no hardware in there"   [place] Left 11/02/2020   Procedure: REVISON OF ARTERIOVENOUS FISTULA;  Surgeon: Waynetta Sandy, MD;  Location: [facility];  Service: Vascular;  Laterality: Left;    Social History   Socioeconomic History   Marital status: Married    Spouse name: Not on file   Number of children: Not on file   Years of education:  Not on file   Highest education level: Not on file  Occupational History   Not on file  Tobacco Use   Smoking status: Never   Smokeless tobacco: Never  Vaping Use   Vaping Use: Never used  Substance and Sexual Activity   Alcohol use: Never   Drug use: Never   Sexual activity: Not Currently  Other Topics Concern   Not on file  Social History Narrative   Not on file   Social Determinants of Health   Financial Resource Strain: Not on file  Food Insecurity: Not on file  Transportation Needs: Not on file  Physical Activity: Not on file  Stress: Not on file  Social Connections: Not on file  Intimate Partner Violence: Not on file    Family History  Problem Relation Age of Onset   Diabetes Mellitus II Sister    Diabetes Sister    Stroke Brother    Heart attack Brother      Review of Systems  Constitutional: Negative.  Negative for fever.  HENT: Negative.  Negative for congestion and sore throat.   Respiratory: Negative.  Negative for cough and shortness of breath.   Cardiovascular:  Negative for chest pain and palpitations.  Gastrointestinal: Negative.  Negative for abdominal pain, nausea and vomiting.  Genitourinary: Negative.  Negative for flank pain.  Skin: Negative.  Negative for rash.  Neurological:  Negative for dizziness and headaches.  All other systems reviewed and are negative.   Physical Exam Vitals reviewed.  Constitutional:      Appearance: Normal appearance.  HENT:     Head: Normocephalic.  Eyes:     Extraocular Movements: Extraocular movements intact.     Pupils: Pupils are equal, round, and reactive to light.  Cardiovascular:     Rate and Rhythm: Normal rate and regular rhythm.     Pulses: Normal pulses.     Heart sounds: Normal heart sounds.  Pulmonary:     Effort: Pulmonary effort is normal.     Breath sounds: Normal breath sounds.  Abdominal:     Palpations: Abdomen is soft.     Tenderness: There is no abdominal tenderness.   Musculoskeletal:     Cervical back: No tenderness.  Lymphadenopathy:     Cervical: No cervical adenopathy.  Skin:    General: Skin is warm and dry.  Neurological:     General: No focal deficit present.     Mental Status: He is alert and oriented to person, place, and time.  Psychiatric:        Mood and Affect: Mood normal.        Behavior: Behavior normal.     ASSESSMENT & PLAN: Problem List Items Addressed This Visit       Endocrine   Type II diabetes mellitus with renal manifestations (HCC) - Primary (Chronic)    Hemoglobin A1c at 7.4 today. Continue daily Tresiba insulin 14 units.  Premeal regular insulin as per sliding scale. Has not seen endocrinologist this year yet.      Relevant Medications   insulin degludec (TRESIBA FLEXTOUCH) 100 UNIT/ML FlexTouch Pen   Other Relevant Orders   POCT glycosylated hemoglobin (Hb A1C) (Completed)     Genitourinary   ESRD on dialysis ([place]) (Chronic)    Stable.  Dialysis 3 times a week.  Scheduled for new graft next Friday.      Patient Instructions  Diabetes Mellitus and Nutrition, Adult When you have diabetes, or diabetes mellitus, it is very important to have healthy eating habits because your blood sugar (glucose) levels are greatly affected by what you eat and drink. Eating healthy foods in the right amounts, at about the same times every day, can help you: Control your blood glucose. Lower your risk of heart disease. Improve your blood pressure. Reach or maintain a healthy weight. What can affect my meal plan? Every person with diabetes is different, and each person has different needs for a meal plan. Your health care provider may recommend that you work with a dietitian to make a meal plan that is best for you. Your meal plan may vary depending on factors such as: The calories you need. The medicines you take. Your weight. Your blood glucose, blood pressure, and cholesterol levels. Your activity level. Other health  conditions you have, such as heart or kidney disease. How do carbohydrates affect me? Carbohydrates, also called carbs, affect your blood glucose level more than any other type of food. Eating carbs naturally raises the amount of glucose in your blood. Carb counting is a method  for keeping track of how many carbs you eat. Counting carbs is important to keep your blood glucose at a healthy level, especially if you use insulin or take certain oral diabetes medicines. It is important to know how many carbs you can safely have in each meal. This is different for every person. Your dietitian can help you calculate how many carbs you should have at each meal and for each snack. How does alcohol affect me? Alcohol can cause a sudden decrease in blood glucose (hypoglycemia), especially if you use insulin or take certain oral diabetes medicines. Hypoglycemia can be a life-threatening condition. Symptoms of hypoglycemia, such as sleepiness, dizziness, and confusion, are similar to symptoms of having too much alcohol. Do not drink alcohol if: Your health care provider tells you not to drink. You are pregnant, may be pregnant, or are planning to become pregnant. If you drink alcohol: Do not drink on an empty stomach. Limit how much you use to: 0-1 drink a day for women. 0-2 drinks a day for men. Be aware of how much alcohol is in your drink. In the [place] one drink equals one 12 oz bottle of beer (355 mL), one 5 oz glass of wine (148 mL), or one 1 oz glass of hard liquor (44 mL). Keep yourself hydrated with water, diet soda, or unsweetened iced tea. Keep in mind that regular soda, juice, and other mixers may contain a lot of sugar and must be counted as carbs. What are tips for following this plan? Reading food labels Start by checking the serving size on the "Nutrition Facts" label of packaged foods and drinks. The amount of calories, carbs, fats, and other nutrients listed on the label is based on one  serving of the item. Many items contain more than one serving per package. Check the total grams (g) of carbs in one serving. You can calculate the number of servings of carbs in one serving by dividing the total carbs by 15. For example, if a food has 30 g of total carbs per serving, it would be equal to 2 servings of carbs. Check the number of grams (g) of saturated fats and trans fats in one serving. Choose foods that have a low amount or none of these fats. Check the number of milligrams (mg) of salt (sodium) in one serving. Most people should limit total sodium intake to less than 2,300 mg per day. Always check the nutrition information of foods labeled as "low-fat" or "nonfat." These foods may be higher in added sugar or refined carbs and should be avoided. Talk to your dietitian to identify your daily goals for nutrients listed on the label. Shopping Avoid buying canned, pre-made, or processed foods. These foods tend to be high in fat, sodium, and added sugar. Shop around the outside edge of the grocery store. This is where you will most often find fresh fruits and vegetables, bulk grains, fresh meats, and fresh dairy. Cooking Use low-heat cooking methods, such as baking, instead of high-heat cooking methods like deep frying. Cook using healthy oils, such as olive, canola, or sunflower oil. Avoid cooking with butter, cream, or high-fat meats. Meal planning Eat meals and snacks regularly, preferably at the same times every day. Avoid going long periods of time without eating. Eat foods that are high in fiber, such as fresh fruits, vegetables, beans, and whole grains. Talk with your dietitian about how many servings of carbs you can eat at each meal. Eat 4-6 oz (112-168 g) of lean  protein each day, such as lean meat, chicken, fish, eggs, or tofu. One ounce (oz) of lean protein is equal to: 1 oz (28 g) of meat, chicken, or fish. 1 egg.  cup (62 g) of tofu. Eat some foods each day that  contain healthy fats, such as avocado, nuts, seeds, and fish. What foods should I eat? Fruits Berries. Apples. Oranges. Peaches. Apricots. Plums. Grapes. Mango. Papaya. Pomegranate. Kiwi. Cherries. Vegetables Lettuce. Spinach. Leafy greens, including kale, chard, collard greens, and mustard greens. Beets. Cauliflower. Cabbage. Broccoli. Carrots. Green beans. Tomatoes. Peppers. Onions. Cucumbers. Brussels sprouts. Grains Whole grains, such as whole-wheat or whole-grain bread, crackers, tortillas, cereal, and pasta. Unsweetened oatmeal. Quinoa. Brown or wild rice. Meats and other proteins Seafood. Poultry without skin. Lean cuts of poultry and beef. Tofu. Nuts. Seeds. Dairy Low-fat or fat-free dairy products such as milk, yogurt, and cheese. The items listed above may not be a complete list of foods and beverages you can eat. Contact a dietitian for more information. What foods should I avoid? Fruits Fruits canned with syrup. Vegetables Canned vegetables. Frozen vegetables with butter or cream sauce. Grains Refined white flour and flour products such as bread, pasta, snack foods, and cereals. Avoid all processed foods. Meats and other proteins Fatty cuts of meat. Poultry with skin. Breaded or fried meats. Processed meat. Avoid saturated fats. Dairy Full-fat yogurt, cheese, or milk. Beverages Sweetened drinks, such as soda or iced tea. The items listed above may not be a complete list of foods and beverages you should avoid. Contact a dietitian for more information. Questions to ask a health care provider Do I need to meet with a diabetes educator? Do I need to meet with a dietitian? What number can I call if I have questions? When are the best times to check my blood glucose? Where to find more information: American Diabetes Association: diabetes.org Academy of Nutrition and Dietetics: www.eatright.[employer] of Diabetes and Digestive and Kidney Diseases:  [employer] Association of Diabetes Care and Education Specialists: www.diabeteseducator.org Summary It is important to have healthy eating habits because your blood sugar (glucose) levels are greatly affected by what you eat and drink. A healthy meal plan will help you control your blood glucose and maintain a healthy lifestyle. Your health care provider may recommend that you work with a dietitian to make a meal plan that is best for you. Keep in mind that carbohydrates (carbs) and alcohol have immediate effects on your blood glucose levels. It is important to count carbs and to use alcohol carefully. This information is not intended to replace advice given to you by your health care provider. Make sure you discuss any questions you have with your health care provider. Document Revised: 06/29/2019 Document Reviewed: 06/29/2019 Elsevier Patient Education  2021 [place], MD [facility]

## 2021-05-30 NOTE — Assessment & Plan Note (Signed)
Hemoglobin A1c at 7.4 today. Continue daily Tresiba insulin 14 units.  Premeal regular insulin as per sliding scale. Has not seen endocrinologist this year yet.

## 2021-05-30 NOTE — Assessment & Plan Note (Signed)
Well-controlled hypertension.  On no medications at present time.

## 2021-05-30 NOTE — Assessment & Plan Note (Signed)
Stable.  Dialysis 3 times a week.  Scheduled for new graft next Friday.

## 2021-05-30 NOTE — Assessment & Plan Note (Signed)
Stable.  No signs of hepatic encephalopathy, on daily lactulose. No signs of GI bleed.  No signs of infection.

## 2021-05-30 NOTE — Assessment & Plan Note (Signed)
Euvolemic at present time.  No signs of acute decompensation.

## 2021-05-31 ENCOUNTER — Other Ambulatory Visit: Payer: Self-pay

## 2021-05-31 ENCOUNTER — Encounter (HOSPITAL_COMMUNITY): Payer: Self-pay | Admitting: Vascular Surgery

## 2021-05-31 NOTE — Progress Notes (Addendum)
I spoke to [name], using [employer], [place] #[id number].  [name] denies chest pain or shortness or breath.  Patient denies having any s/s of Covid in his household.  Patient denies any known exposure to Covid.   [name] has type II diabetes. Patient reports that CBG runs 150- 350.  I instructed patient to take 12/2 dose of Tresiba -  7 units. If CBG is less than 70, do not take Treshiba, drink 1/2 cup- 4 oz of apple or cranberry juice, recheck CBG in 15 minutes, if CBG does not rise above , call the pre- op desk number that I gave you.  If CBG is greater that 220, take 1/2 of sliding scale insulin.   I instructed patient to shower with antibiotic soap, if it is available.  Dry off with a clean towel. Do not put lotion, powder, cologne or deodorant or makeup.No jewelry or piercings. Men may shave their face and neck. Woman should not shave. No nail polish, artificial or acrylic nails. Wear clean clothes, brush your teeth. Glasses, contact lens,dentures or partials may not be worn in the OR. If you need to wear them, please bring a case for glasses, do not wear contacts or bring a case, the hospital does not have contact cases, dentures or partials will have to be removed , make sure they are clean, we will provide a denture cup to put them in. You will need some one to drive you home and a responsible person over the age of 64 to stay with you for the first 24 hours after surgery.   I sent a secured Email to [employer], to notify of time change, new arrival time is 14

## 2021-06-01 ENCOUNTER — Encounter (HOSPITAL_COMMUNITY): Payer: Self-pay | Admitting: Vascular Surgery

## 2021-06-01 ENCOUNTER — Other Ambulatory Visit: Payer: Self-pay

## 2021-06-01 ENCOUNTER — Encounter (HOSPITAL_COMMUNITY)
Admission: RE | Disposition: A | Discharge status: Home / Self Care | Payer: Self-pay | Source: Ambulatory Visit | Attending: Vascular Surgery

## 2021-06-01 ENCOUNTER — Ambulatory Visit (HOSPITAL_COMMUNITY)
Admission: RE | Admit: 2021-06-01 | Discharge: 2021-06-01 | Disposition: A | Discharge status: Home / Self Care | Payer: Medicare Other | Source: Ambulatory Visit | Attending: Vascular Surgery | Admitting: Vascular Surgery

## 2021-06-01 ENCOUNTER — Ambulatory Visit (HOSPITAL_COMMUNITY): Payer: Medicare Other | Admitting: Anesthesiology

## 2021-06-01 DIAGNOSIS — Z992 Dependence on renal dialysis: Secondary | ICD-10-CM | POA: Diagnosis not present

## 2021-06-01 DIAGNOSIS — I5022 Chronic systolic (congestive) heart failure: Secondary | ICD-10-CM | POA: Diagnosis not present

## 2021-06-01 DIAGNOSIS — I509 Heart failure, unspecified: Secondary | ICD-10-CM | POA: Insufficient documentation

## 2021-06-01 DIAGNOSIS — Z794 Long term (current) use of insulin: Secondary | ICD-10-CM | POA: Diagnosis not present

## 2021-06-01 DIAGNOSIS — D631 Anemia in chronic kidney disease: Secondary | ICD-10-CM | POA: Diagnosis not present

## 2021-06-01 DIAGNOSIS — Z91014 Allergy to mammalian meats: Secondary | ICD-10-CM | POA: Diagnosis not present

## 2021-06-01 DIAGNOSIS — Z888 Allergy status to other drugs, medicaments and biological substances status: Secondary | ICD-10-CM | POA: Diagnosis not present

## 2021-06-01 DIAGNOSIS — Z79899 Other long term (current) drug therapy: Secondary | ICD-10-CM | POA: Insufficient documentation

## 2021-06-01 DIAGNOSIS — E1122 Type 2 diabetes mellitus with diabetic chronic kidney disease: Secondary | ICD-10-CM | POA: Diagnosis not present

## 2021-06-01 DIAGNOSIS — N186 End stage renal disease: Secondary | ICD-10-CM | POA: Diagnosis not present

## 2021-06-01 DIAGNOSIS — I132 Hypertensive heart and chronic kidney disease with heart failure and with stage 5 chronic kidney disease, or end stage renal disease: Secondary | ICD-10-CM | POA: Insufficient documentation

## 2021-06-01 HISTORY — PX: AV FISTULA PLACEMENT: SHX1204

## 2021-06-01 LAB — GLUCOSE, CAPILLARY
Glucose-Capillary: 137 mg/dL — ABNORMAL HIGH (ref 70–99)
Glucose-Capillary: 163 mg/dL — ABNORMAL HIGH (ref 70–99)

## 2021-06-01 SURGERY — ARTERIOVENOUS (AV) FISTULA CREATION
Anesthesia: General | Site: Arm Upper | Laterality: Right

## 2021-06-01 MED ORDER — PHENYLEPHRINE HCL-NACL 20-0.9 MG/250ML-% IV SOLN
INTRAVENOUS | Status: DC | PRN
  Administered 2021-06-01: 30 ug/min via INTRAVENOUS

## 2021-06-01 MED ORDER — ORAL CARE MOUTH RINSE: 15.0000 mL | Freq: Once | OROMUCOSAL | Status: AC

## 2021-06-01 MED ORDER — CEFAZOLIN SODIUM-DEXTROSE 2-4 GM/100ML-% IV SOLN
2.0000 g | INTRAVENOUS | Status: AC
  Administered 2021-06-01: 2 g via INTRAVENOUS
  Filled 2021-06-01: qty 100

## 2021-06-01 MED ORDER — LIDOCAINE-EPINEPHRINE (PF) 1 %-1:200000 IJ SOLN
INTRAMUSCULAR | Status: AC
  Filled 2021-06-01: qty 30

## 2021-06-01 MED ORDER — ONDANSETRON HCL 4 MG/2ML IJ SOLN
INTRAMUSCULAR | Status: AC
  Administered 2021-06-01: 4 mg via INTRAVENOUS
  Filled 2021-06-01: qty 2

## 2021-06-01 MED ORDER — PHENYLEPHRINE HCL (PRESSORS) 10 MG/ML IV SOLN: INTRAVENOUS | Status: DC | PRN

## 2021-06-01 MED ORDER — PAPAVERINE HCL 30 MG/ML IJ SOLN
INTRAMUSCULAR | Status: AC
  Filled 2021-06-01: qty 2

## 2021-06-01 MED ORDER — ACD FORMULA A 0.73-2.45-2.2 GM/100ML VI SOLN
1000.0000 mL | Freq: Once | Status: DC
  Filled 2021-06-01: qty 1000

## 2021-06-01 MED ORDER — OXYCODONE HCL 5 MG/5ML PO SOLN
5.0000 mg | Freq: Once | ORAL | Status: AC | PRN
Start: 2021-06-01 — End: 2021-06-01

## 2021-06-01 MED ORDER — FENTANYL CITRATE (PF) 100 MCG/2ML IJ SOLN: 25.0000 ug | INTRAMUSCULAR | Status: DC | PRN

## 2021-06-01 MED ORDER — PROPOFOL 10 MG/ML IV BOLUS
INTRAVENOUS | Status: DC | PRN
  Administered 2021-06-01 (×2): 100 mg via INTRAVENOUS

## 2021-06-01 MED ORDER — CHLORHEXIDINE GLUCONATE 4 % EX LIQD: 60.0000 mL | Freq: Once | CUTANEOUS | Status: DC

## 2021-06-01 MED ORDER — CHLORHEXIDINE GLUCONATE 0.12 % MT SOLN
15.0000 mL | Freq: Once | OROMUCOSAL | Status: AC
  Administered 2021-06-01: 15 mL via OROMUCOSAL
  Filled 2021-06-01: qty 15

## 2021-06-01 MED ORDER — LIDOCAINE 2% (20 MG/ML) 5 ML SYRINGE
INTRAMUSCULAR | Status: DC | PRN
  Administered 2021-06-01: 60 mg via INTRAVENOUS

## 2021-06-01 MED ORDER — SODIUM CHLORIDE 0.9 % IV SOLN
0.1500 mg/kg/h | Freq: Once | INTRAVENOUS | Status: DC
  Filled 2021-06-01: qty 250

## 2021-06-01 MED ORDER — ONDANSETRON HCL 4 MG/2ML IJ SOLN
INTRAMUSCULAR | Status: DC | PRN
  Administered 2021-06-01: 4 mg via INTRAVENOUS

## 2021-06-01 MED ORDER — ANTICOAGULANT SODIUM CITRATE 4% (200MG/5ML) IV SOLN: 250.0000 mL | Freq: Once | Status: DC

## 2021-06-01 MED ORDER — ARGATROBAN 1 MG/ML SYRINGE FOR VASCULAR SURGERY
10.0000 mg | INTRAVENOUS | Status: DC | PRN
  Filled 2021-06-01: qty 10

## 2021-06-01 MED ORDER — PROPOFOL 10 MG/ML IV BOLUS
INTRAVENOUS | Status: AC
  Filled 2021-06-01: qty 20

## 2021-06-01 MED ORDER — SODIUM CHLORIDE 0.9 % IV SOLN: INTRAVENOUS | Status: DC

## 2021-06-01 MED ORDER — 0.9 % SODIUM CHLORIDE (POUR BTL) OPTIME
TOPICAL | Status: DC | PRN
  Administered 2021-06-01: 2000 mL

## 2021-06-01 MED ORDER — LIDOCAINE-EPINEPHRINE (PF) 1 %-1:200000 IJ SOLN
INTRAMUSCULAR | Status: DC | PRN
  Administered 2021-06-01: 10 mL

## 2021-06-01 MED ORDER — LIDOCAINE 2% (20 MG/ML) 5 ML SYRINGE
INTRAMUSCULAR | Status: AC
  Filled 2021-06-01: qty 5

## 2021-06-01 MED ORDER — OXYCODONE HCL 5 MG PO TABS: 5.0000 mg | ORAL_TABLET | Freq: Once | ORAL | Status: AC | PRN

## 2021-06-01 MED ORDER — ONDANSETRON HCL 4 MG/2ML IJ SOLN
4.0000 mg | Freq: Four times a day (QID) | INTRAMUSCULAR | Status: AC | PRN
Start: 2021-06-01 — End: 2021-06-01

## 2021-06-01 MED ORDER — OXYCODONE-ACETAMINOPHEN 5-325 MG PO TABS: 1.0000 | ORAL_TABLET | Freq: Four times a day (QID) | ORAL | 0 refills | Status: DC | PRN

## 2021-06-01 MED ORDER — OXYCODONE HCL 5 MG PO TABS
ORAL_TABLET | ORAL | Status: AC
  Administered 2021-06-01: 5 mg via ORAL
  Filled 2021-06-01: qty 1

## 2021-06-01 SURGICAL SUPPLY — 35 items
ADH SKN CLS APL DERMABOND .7 (GAUZE/BANDAGES/DRESSINGS) ×1
ARMBAND PINK RESTRICT EXTREMIT (MISCELLANEOUS) ×3 IMPLANT
BAG COUNTER SPONGE SURGICOUNT (BAG) ×2 IMPLANT
BAG SPNG CNTER NS LX DISP (BAG) ×1
BAG SURGICOUNT SPONGE COUNTING (BAG) ×1
CANISTER SUCT 3000ML PPV (MISCELLANEOUS) ×3 IMPLANT
CLIP LIGATING EXTRA MED SLVR (CLIP) ×3 IMPLANT
CLIP LIGATING EXTRA SM BLUE (MISCELLANEOUS) ×3 IMPLANT
CLIP VESOCCLUDE MED 6/CT (CLIP) ×3 IMPLANT
CLIP VESOCCLUDE SM WIDE 6/CT (CLIP) ×3 IMPLANT
COVER PROBE W GEL 5X96 (DRAPES) ×2 IMPLANT
DERMABOND ADVANCED (GAUZE/BANDAGES/DRESSINGS) ×2
DERMABOND ADVANCED .7 DNX12 (GAUZE/BANDAGES/DRESSINGS) ×1 IMPLANT
ELECT REM PT RETURN 9FT ADLT (ELECTROSURGICAL) ×3
ELECTRODE REM PT RTRN 9FT ADLT (ELECTROSURGICAL) ×1 IMPLANT
GLOVE SURG ENC MOIS LTX SZ7.5 (GLOVE) ×3 IMPLANT
GLOVE SURG UNDER POLY LF SZ6.5 (GLOVE) ×8 IMPLANT
GLOVE SURG UNDER POLY LF SZ7 (GLOVE) ×2 IMPLANT
GOWN STRL REUS W/ TWL LRG LVL3 (GOWN DISPOSABLE) ×2 IMPLANT
GOWN STRL REUS W/ TWL XL LVL3 (GOWN DISPOSABLE) ×1 IMPLANT
GOWN STRL REUS W/TWL LRG LVL3 (GOWN DISPOSABLE) ×9
GOWN STRL REUS W/TWL XL LVL3 (GOWN DISPOSABLE) ×3
INSERT FOGARTY SM (MISCELLANEOUS) IMPLANT
KIT BASIN OR (CUSTOM PROCEDURE TRAY) ×3 IMPLANT
KIT TURNOVER KIT B (KITS) ×3 IMPLANT
NS IRRIG 1000ML POUR BTL (IV SOLUTION) ×5 IMPLANT
PACK CV ACCESS (CUSTOM PROCEDURE TRAY) ×3 IMPLANT
PAD ARMBOARD 7.5X6 YLW CONV (MISCELLANEOUS) ×6 IMPLANT
SUT MNCRL AB 4-0 PS2 18 (SUTURE) ×3 IMPLANT
SUT PROLENE 6 0 BV (SUTURE) ×3 IMPLANT
SUT VIC AB 3-0 SH 27 (SUTURE) ×3
SUT VIC AB 3-0 SH 27X BRD (SUTURE) ×1 IMPLANT
TOWEL GREEN STERILE (TOWEL DISPOSABLE) ×3 IMPLANT
UNDERPAD 30X36 HEAVY ABSORB (UNDERPADS AND DIAPERS) ×3 IMPLANT
WATER STERILE IRR 1000ML POUR (IV SOLUTION) ×3 IMPLANT

## 2021-06-01 NOTE — H&P (Addendum)
HPI:   William [name] is a 70 y.o. male well-known to me with end-stage renal disease.  Most recently we converted his left upper arm AV fistula to the graft.  This is not now subsequently thrombosed he is on dialysis via catheter.  He is here today with vein mapping to plan further surgery.  He discussion with the patient and his son patient is not eager to have any surgery at this time.  He is hopeful to be put back on the list at [facility] for transplant.  He is right-hand dominant has never had surgery in the right upper extremity in the past.       Past Medical History:  Diagnosis Date   Anemia     Blind one eye     CHF (congestive heart failure) ([place])     Cirrhosis ([place])     ESRD on hemodialysis ([place])     Goodpasture's disease ([place])      on outpatient plasmapheresis/notes 11/20/2017   Grade I diastolic dysfunction 96/11/5407   History of anemia due to chronic kidney disease     History of blood transfusion X 1    "UGIB; low blood count"   History of plasmapheresis      "qod" (11/20/2017)   Hypertension     Pancytopenia ([place]) 08/20/2017   Type II diabetes mellitus ([place])           Family History  Problem Relation Age of Onset   Diabetes Mellitus II Sister     Diabetes Sister     Stroke Brother     Heart attack Brother           Past Surgical History:  Procedure Laterality Date   A/V FISTULAGRAM Left 10/16/2020    Procedure: A/V FISTULAGRAM;  Surgeon: Waynetta Sandy, MD;  Location: [facility];  Service: Cardiovascular;  Laterality: Left;   AMPUTATION TOE Left 10/07/2020    Procedure: AMPUTATION THIRD TOE;  Surgeon: Criselda Peaches, DPM;  Location: [facility];  Service: Podiatry;  Laterality: Left;   AV FISTULA PLACEMENT Left 12/26/2017    Procedure: LEFT BRACHIOCEPHALIC ARTERIOVENOUS (AV) FISTULA CREATION;  Surgeon: Conrad [place], MD;  Location: [facility];  Service: Vascular;  Laterality: Left;   AV FISTULA PLACEMENT Left 11/02/2020    Procedure: INSERTION OF  ARTERIOVENOUS (AV) GORE-TEX GRAFT ARM;  Surgeon: Waynetta Sandy, MD;  Location: [facility];  Service: Vascular;  Laterality: Left;   BASCILIC VEIN TRANSPOSITION Left 02/16/2018    Procedure: BRACHIOBASILIC VEIN TRANSPOSITION SECOND STAGE;  Surgeon: Conrad [place], MD;  Location: [facility];  Service: Vascular;  Laterality: Left;   CATARACT EXTRACTION W/ INTRAOCULAR LENS  IMPLANT, BILATERAL Bilateral     ESOPHAGEAL BANDING N/A 04/01/2018    Procedure: ESOPHAGEAL BANDING;  Surgeon: Otis Brace, MD;  Location: [facility];  Service: Gastroenterology;  Laterality: N/A;   ESOPHAGEAL BANDING N/A 06/29/2018    Procedure: ESOPHAGEAL BANDING;  Surgeon: Otis Brace, MD;  Location: [facility];  Service: Gastroenterology;  Laterality: N/A;   ESOPHAGOGASTRODUODENOSCOPY (EGD) WITH PROPOFOL N/A 04/01/2018    Procedure: ESOPHAGOGASTRODUODENOSCOPY (EGD) WITH PROPOFOL;  Surgeon: Otis Brace, MD;  [facility]: [facility];  Service: Gastroenterology;  Laterality: N/A;   ESOPHAGOGASTRODUODENOSCOPY (EGD) WITH PROPOFOL N/A 06/29/2018    Procedure: ESOPHAGOGASTRODUODENOSCOPY (EGD) WITH PROPOFOL;  Surgeon: Otis Brace, MD;  [facility];  Service: Gastroenterology;  Laterality: N/A;   ESOPHAGOGASTRODUODENOSCOPY (EGD) WITH PROPOFOL N/A 09/30/2018    Procedure: ESOPHAGOGASTRODUODENOSCOPY (EGD) WITH PROPOFOL;  Surgeon: Otis Brace, MD;  Location: [facility];  Service: Gastroenterology;  Laterality: N/A;   ESOPHAGOGASTRODUODENOSCOPY (EGD) WITH PROPOFOL N/A 04/20/2019    Procedure: ESOPHAGOGASTRODUODENOSCOPY (EGD) WITH PROPOFOL;  Surgeon: Otis Brace, MD;  [facility]: [facility];  Service: Gastroenterology;  Laterality: N/A;   ESOPHAGOGASTRODUODENOSCOPY (EGD) WITH PROPOFOL N/A 04/17/2020    Procedure: ESOPHAGOGASTRODUODENOSCOPY (EGD) WITH PROPOFOL;  Surgeon: Wonda Horner, MD;  Location: [facility];  Service: Gastroenterology;  Laterality: N/A;   I & D EXTREMITY Left 02/18/2018     Procedure: IRRIGATION AND DEBRIDEMENT LEFT ARM;  Surgeon: Serafina Mitchell, MD;  Location: [facility];  Service: Vascular;  Laterality: Left;   INSERTION OF DIALYSIS CATHETER N/A 01/19/2018    Procedure: INSERTION OF DIALYSIS CATHETER - RIGHT INTERNAL JUGULAR PLACEMENT;  Surgeon: Rosetta Posner, MD;  Location: [facility];  Service: Vascular;  Laterality: N/A;   INSERTION OF DIALYSIS CATHETER Right 11/02/2020    Procedure: INSERTION OF TUNNELED DIALYSIS CATHETER;  Surgeon: Waynetta Sandy, MD;  Location: [facility];  Service: Vascular;  Laterality: Right;   IR FLUORO GUIDE CV LINE RIGHT   11/10/2017   IR PARACENTESIS   12/10/2017   IR PARACENTESIS   12/26/2017   IR REMOVAL TUN CV CATH W/O FL   11/28/2017   IR THROMBECTOMY AV FISTULA W/THROMBOLYSIS/PTA INC/SHUNT/IMG LEFT Left 06/02/2019   IR US GUIDE VASC ACCESS LEFT   06/02/2019   IR US GUIDE VASC ACCESS RIGHT   11/10/2017   NECK SURGERY   2007    "back of neck; no hardware in there"   [place] Left 11/02/2020    Procedure: REVISON OF ARTERIOVENOUS FISTULA;  Surgeon: Waynetta Sandy, MD;  Location: [facility];  Service: Vascular;  Laterality: Left;      Short Social History:  Social History        Tobacco Use   Smoking status: Never   Smokeless tobacco: Never  Substance Use Topics   Alcohol use: Never           Allergies  Allergen Reactions   Heparin Other (See Comments)      Patient is Muslim and is not permitted Pork derative due to religious belief)   Pork-Derived Products Other (See Comments)      Patient does not eat pork due to religious beliefs            Current Outpatient Medications  Medication Sig Dispense Refill   calcium acetate (PHOSLO) 667 MG capsule Take 1 capsule (667 mg total) by mouth 3 (three) times daily with meals.       glucose blood (ONETOUCH VERIO) test strip USE 1 STRIP TO CHECK GLUCOSE THREE TIMES DAILY 150 each 3   insulin degludec (TRESIBA FLEXTOUCH) 100 UNIT/ML FlexTouch Pen Inject  14 Units into the skin daily. Inject 14 units under the skin once daily. NO FURTHER REFILLS UNTIL PATIENT IS SEEN IN THE OFFICE (Patient taking differently: Inject 12 Units into the skin daily before breakfast.) 2 mL 0   Insulin Pen Needle (ADVOCATE INSULIN PEN NEEDLES) 31G X 5 MM MISC Use four times daily to inject insulin 150 each 1   insulin regular (NOVOLIN R,HUMULIN R) 100 units/mL injection Inject 6-8 Units into the skin See admin instructions. Inject 8 units subcutaneously with breakfast, and 6 units with lunch and supper       lactulose (CHRONULAC) 10 GM/15ML solution Take 45 mLs (30 g total) by mouth 3 (three) times daily. (Patient taking differently: Take 30 g by  mouth daily.) 946 mL 1   lidocaine-prilocaine (EMLA) cream Apply 1 application topically See admin instructions. Apply topically prior to dialysis on Tuesday, Thursday, Saturday       Methoxy PEG-Epoetin Beta (MIRCERA IJ) 1 Dose See admin instructions. Dialysis gives this medication       Multiple Vitamin (MULTIVITAMIN WITH MINERALS) TABS tablet Take 1 tablet by mouth daily.       mupirocin ointment (BACTROBAN) 2 % Apply 1 application topically daily. 30 g 2   ondansetron (ZOFRAN ODT) 4 MG disintegrating tablet Take 1 tablet (4 mg total) by mouth 2 (two) times daily as needed for nausea or vomiting. 20 tablet 0   ONE TOUCH LANCETS MISC Use to test blood sugar three times daily 100 each 2   rifaximin (XIFAXAN) 550 MG TABS tablet Take 1 tablet (550 mg total) by mouth 2 (two) times daily. 60 tablet 1   rosuvastatin (CRESTOR) 5 MG tablet Take 1 tablet (5 mg total) by mouth daily. 90 tablet 0   sevelamer carbonate (RENVELA) 800 MG tablet Take 800 mg by mouth 3 (three) times daily.        No current facility-administered medications for this visit.      Review of Systems  Constitutional:  Constitutional negative. HENT: HENT negative.  Eyes: Eyes negative.  Respiratory: Respiratory negative.  Cardiovascular: Cardiovascular negative.   GI: Gastrointestinal negative.  Musculoskeletal: Musculoskeletal negative.  Skin: Skin negative.  Neurological: Neurological negative. Hematologic: Hematologic/lymphatic negative.  Psychiatric: Psychiatric negative.         Objective:    Vitals:   06/01/21 0849  Pulse: 82  Resp: 16  Temp: 98.1 F (36.7 C)  SpO2: 99%        Physical Exam Constitutional:      Appearance: Normal appearance.  HENT:     Head: Normocephalic.     Nose:     Comments: Wearing a mask Eyes:     Pupils: Pupils are equal, round, and reactive to light.  Cardiovascular:     Rate and Rhythm: Normal rate.     Pulses:          Radial pulses are 2+ on the right side and 2+ on the left side.  Pulmonary:     Effort: Pulmonary effort is normal.  Abdominal:     General: Abdomen is flat.  Musculoskeletal:        General: Normal range of motion.     Right lower leg: No edema.     Comments: Left upper arm access without thrill  Skin:    General: Skin is warm.     Capillary Refill: Capillary refill takes less than 2 seconds.  Neurological:     General: No focal deficit present.     Mental Status: He is alert.  Psychiatric:        Mood and Affect: Mood normal.        Behavior: Behavior normal.      Data: +-----------------+-------------+----------+--------+  Right Cephalic   Diameter (cm)Depth (cm)Findings  +-----------------+-------------+----------+--------+  Shoulder             0.13        0.25             +-----------------+-------------+----------+--------+  Prox upper arm       0.13        0.25             +-----------------+-------------+----------+--------+  Mid upper arm        0.11  0.21             +-----------------+-------------+----------+--------+  Dist upper arm       0.12        0.37             +-----------------+-------------+----------+--------+  Antecubital fossa    0.13        0.28              +-----------------+-------------+----------+--------+  Prox forearm         0.10        0.18             +-----------------+-------------+----------+--------+  Mid forearm          0.15        0.25             +-----------------+-------------+----------+--------+  Dist forearm         0.11        0.18             +-----------------+-------------+----------+--------+  Wrist                0.08        0.19             +-----------------+-------------+----------+--------+   +-----------------+-------------+----------+---------+  Right Basilic    Diameter (cm)Depth (cm)Findings   +-----------------+-------------+----------+---------+  Prox upper arm       0.22        1.15              +-----------------+-------------+----------+---------+  Mid upper arm        0.27        1.02              +-----------------+-------------+----------+---------+  Dist upper arm       0.37        0.95              +-----------------+-------------+----------+---------+  Antecubital fossa    0.21        0.86   branching  +-----------------+-------------+----------+---------+  Prox forearm         0.20        0.23   branching  +-----------------+-------------+----------+---------+  Mid forearm          0.21        0.19              +-----------------+-------------+----------+---------+  Distal forearm       0.15        0.27              +-----------------+-------------+----------+---------+  Wrist                0.19        0.22              +-----------------+-------------+----------+---------+   +-----------------+-------------+----------+---------+  Left Cephalic    Diameter (cm)Depth (cm)Findings   +-----------------+-------------+----------+---------+  Shoulder             0.19        0.19              +-----------------+-------------+----------+---------+  Prox upper arm       0.16        0.16               +-----------------+-------------+----------+---------+  Mid upper arm        0.11        0.25   branching  +-----------------+-------------+----------+---------+  Dist upper arm  0.10        0.19              +-----------------+-------------+----------+---------+  Antecubital fossa    0.11        0.21              +-----------------+-------------+----------+---------+  Prox forearm         0.13        0.20              +-----------------+-------------+----------+---------+  Mid forearm          0.16        0.21              +-----------------+-------------+----------+---------+  Dist forearm         0.17        0.18              +-----------------+-------------+----------+---------+  Wrist                0.15        0.21              +-----------------+-------------+----------+---------+          Assessment/Plan:     70 year old male with end-stage renal disease currently dialyzing via catheter.  He has a previous left upper extremity fistula which was converted to graft.  He does have a marginal basilic vein on the right for access creation.  plan is for right arm avf vs avg.        Waynetta Sandy MD [facility]

## 2021-06-01 NOTE — Anesthesia Procedure Notes (Signed)
Procedure Name: LMA Insertion Date/Time: 06/01/2021 9:37 AM Performed by: Annamary Carolin, CRNA Pre-anesthesia Checklist: Patient identified, Emergency Drugs available, Suction available and Patient being monitored Patient Re-evaluated:Patient Re-evaluated prior to induction Oxygen Delivery Method: Circle System Utilized Preoxygenation: Pre-oxygenation with 100% oxygen Induction Type: IV induction Ventilation: Mask ventilation without difficulty LMA: LMA inserted LMA Size: 4.0 Number of attempts: 1 Airway Equipment and Method: Bite block Placement Confirmation: positive ETCO2 Tube secured with: Tape Dental Injury: Teeth and Oropharynx as per pre-operative assessment  Comments: With ease

## 2021-06-01 NOTE — Discharge Instructions (Signed)
[facility]  Discharge Instructions  AV Fistula or Graft Surgery for Dialysis Access  Please refer to the following instructions for your post-procedure care. Your surgeon or physician assistant will discuss any changes with you.  Activity  You may drive the day following your surgery, if you are comfortable and no longer taking prescription pain medication. Resume full activity as the soreness in your incision resolves.  Bathing/Showering  You may shower after you go home. Keep your incision dry for 48 hours. Do not soak in a bathtub, hot tub, or swim until the incision heals completely. You may not shower if you have a hemodialysis catheter.  Incision Care  Clean your incision with mild soap and water after 48 hours. Pat the area dry with a clean towel. You do not need a bandage unless otherwise instructed. Do not apply any ointments or creams to your incision. You may have skin glue on your incision. Do not peel it off. It will come off on its own in about one week. Your arm may swell a bit after surgery. To reduce swelling use pillows to elevate your arm so it is above your heart. Your doctor will tell you if you need to lightly wrap your arm with an ACE bandage.  Diet  Resume your normal diet. There are not special food restrictions following this procedure. In order to heal from your surgery, it is CRITICAL to get adequate nutrition. Your body requires vitamins, minerals, and protein. Vegetables are the best source of vitamins and minerals. Vegetables also provide the perfect balance of protein. Processed food has little nutritional value, so try to avoid this.  Medications  Resume taking all of your medications. If your incision is causing pain, you may take over-the counter pain relievers such as acetaminophen (Tylenol). If you were prescribed a stronger pain medication, please be aware these medications can cause nausea and constipation. Prevent  nausea by taking the medication with a snack or meal. Avoid constipation by drinking plenty of fluids and eating foods with high amount of fiber, such as fruits, vegetables, and grains.  Do not take Tylenol if you are taking prescription pain medications.  Follow up Your surgeon may want to see you in the office following your access surgery. If so, this will be arranged at the time of your surgery.  Please call us immediately for any of the following conditions:  Increased pain, redness, drainage (pus) from your incision site Fever of 101 degrees or higher Severe or worsening pain at your incision site Hand pain or numbness.  Reduce your risk of vascular disease:  Stop smoking. If you would like help, call [facility] at 1-800-QUIT-NOW (732)559-5782) or [place] at [place] your cholesterol Maintain a desired weight Control your diabetes Keep your blood pressure down  Dialysis  It will take several weeks to several months for your new dialysis access to be ready for use. Your surgeon will determine when it is okay to use it. Your nephrologist will continue to direct your dialysis. You can continue to use your Permcath until your new access is ready for use.   06/01/2021 William [name] [id number] 1951-04-21  Surgeon(s): Waynetta Sandy, MD  Procedure(s): RIGHT ARM BRACHIOBASILIC ARTERIOVENOUS (AV) FISTULA CREATION   May stick graft immediately   May stick graft on designated area only:   X Do not stick right AV fistula for 12 weeks    If you have any questions, please call the  office at [phone].

## 2021-06-01 NOTE — Op Note (Signed)
    Patient name: JAHLANI LORENTZ MRN: [id number] DOB: [birth year] Sex: male  06/01/2021 Pre-operative Diagnosis: End-stage renal disease Post-operative diagnosis:  Same Surgeon:  Erlene Quan C. Donzetta Matters, MD Assistant: Paulo Fruit, PA Procedure Performed:  Right arm basilic vein to brachial artery AV fistula creation.  Indications: 70 year old male history of end-stage renal disease currently dialyzing via catheter.  He has failed access in the left upper extremity.  He is indicated for right upper extremity access.  An assistant was necessary to facilitate exposure and expedite the case.  Findings: I cannot identify any suitable cephalic vein either at the antecubitum of the upper arm.  The basilic vein did trace onto the antecubitum which quite diminutive there but above the antecubitum measured approximately 3 mm.  This was easily dilated to 4 mm.  At completion there was a strong thrill in the basilic vein that be traced with Doppler upper arm and a palpable radial pulse the wrist was confirmed with Doppler.   Procedure:  The patient was identified in the holding area and taken to the operating room where is placed supine operative table and LMA anesthesia was induced.  He was sterilely prepped and draped in the right upper extremity usual fashion, antibiotics were ministered and a timeout was called.  Ultrasound was used we identified a suitable brachial artery and basilic vein in the upper arm.  The area was anesthetized with 1% lidocaine.  A transverse incision was made in the upper arm.  We dissected down to the vein.  It was marked for orientation.  We protected the nerve we did have to divide 1 small branch of the nerve overlying the vein between clips.  Multiple branches were divided between clips and ties.  We then dissected the deep fascia the brachial artery this appeared healthy and was actually of good size.  A vessel loop was placed around this.  We then clamped the vein distally transected  and tied it off.  We then spatulated it dilated it and flushed with saline patient has a heparin allergy.  We then clamped the artery distally proximally opened longitudinally flushed with saline distally.  We then sewed the vein end-to-side with 6-0 Prolene suture.  Prior completion of flushing all directions.  Upon completion there was a very strong thrill in the vein confirmed with Doppler and a palpable radial artery pulse with Doppler confirmed as well.  We irrigated the wound we obtain hemostasis we closed in layers with Vicryl and Monocryl.  He was awakened from anesthesia having tolerated procedure without any complication.  All counts were correct at completion.  EBL: 20 cc   Shelia Magallon C. Donzetta Matters, MD [facility] [place] Office: [phone] Pager: [phone]

## 2021-06-01 NOTE — Anesthesia Preprocedure Evaluation (Signed)
Anesthesia Evaluation  Patient identified by MRN, date of birth, ID band Patient awake    Reviewed: Allergy & Precautions, H&P , NPO status , Patient's Chart, lab work & pertinent test results  Airway Mallampati: II   Neck ROM: full    Dental   Pulmonary neg pulmonary ROS,    breath sounds clear to auscultation       Cardiovascular hypertension, +CHF   Rhythm:regular Rate:Normal     Neuro/Psych    GI/Hepatic   Endo/Other  diabetes, Type 2  Renal/GU ESRF and DialysisRenal disease     Musculoskeletal   Abdominal   Peds  Hematology  (+) Blood dyscrasia, anemia ,   Anesthesia Other Findings   Reproductive/Obstetrics                             Anesthesia Physical Anesthesia Plan  ASA: 3  Anesthesia Plan: General   Post-op Pain Management:    Induction: Intravenous  PONV Risk Score and Plan: 2 and Ondansetron, Dexamethasone and Treatment may vary due to age or medical condition  Airway Management Planned: LMA  Additional Equipment:   Intra-op Plan:   Post-operative Plan: Extubation in OR  Informed Consent: I have reviewed the patients History and Physical, chart, labs and discussed the procedure including the risks, benefits and alternatives for the proposed anesthesia with the patient or authorized representative who has indicated his/her understanding and acceptance.     Dental advisory given  Plan Discussed with: CRNA, Anesthesiologist and Surgeon  Anesthesia Plan Comments:         Anesthesia Quick Evaluation

## 2021-06-01 NOTE — Transfer of Care (Signed)
Immediate Anesthesia Transfer of Care Note  Patient: William [name]  Procedure(s) Performed: RIGHT ARM BRACHIOBASILIC ARTERIOVENOUS (AV) FISTULA CREATION (Right: Arm Upper)  Patient Location: PACU  Anesthesia Type:General  Level of Consciousness: awake, alert  and patient cooperative  Airway & Oxygen Therapy: Patient Spontanous Breathing and Patient connected to face mask  Post-op Assessment: Report given to RN, Post -op Vital signs reviewed and stable and Patient moving all extremities X 4  Post vital signs: Reviewed and stable  Last Vitals:  Vitals Value Taken Time  BP 148/63 06/01/21 1051  Temp    Pulse 69 06/01/21 1052  Resp 13 06/01/21 1052  SpO2 100 % 06/01/21 1052  Vitals shown include unvalidated device data.  Last Pain:  Vitals:   06/01/21 0849  TempSrc: Oral  PainSc: 0-No pain         Complications: No notable events documented.

## 2021-06-02 NOTE — Anesthesia Postprocedure Evaluation (Signed)
Anesthesia Post Note  Patient: William [name]  Procedure(s) Performed: RIGHT ARM BRACHIOBASILIC ARTERIOVENOUS (AV) FISTULA CREATION (Right: Arm Upper)     Patient location during evaluation: PACU Anesthesia Type: General Level of consciousness: awake and alert Pain management: pain level controlled Vital Signs Assessment: post-procedure vital signs reviewed and stable Respiratory status: spontaneous breathing, nonlabored ventilation, respiratory function stable and patient connected to nasal cannula oxygen Cardiovascular status: blood pressure returned to baseline and stable Postop Assessment: no apparent nausea or vomiting Anesthetic complications: no   No notable events documented.  Last Vitals:  Vitals:   06/01/21 1105 06/01/21 1135  BP: 130/89   Pulse: 70   Resp: 13   Temp:  (!) 36.4 C  SpO2: 99%     Last Pain:  Vitals:   06/01/21 1135  TempSrc:   PainSc: Gray

## 2021-06-03 ENCOUNTER — Encounter (HOSPITAL_COMMUNITY): Payer: Self-pay | Admitting: Vascular Surgery

## 2021-06-05 DIAGNOSIS — D509 Iron deficiency anemia, unspecified: Secondary | ICD-10-CM | POA: Diagnosis not present

## 2021-06-05 DIAGNOSIS — E1122 Type 2 diabetes mellitus with diabetic chronic kidney disease: Secondary | ICD-10-CM | POA: Diagnosis not present

## 2021-06-05 DIAGNOSIS — N2581 Secondary hyperparathyroidism of renal origin: Secondary | ICD-10-CM | POA: Diagnosis not present

## 2021-06-05 DIAGNOSIS — N186 End stage renal disease: Secondary | ICD-10-CM | POA: Diagnosis not present

## 2021-06-05 DIAGNOSIS — Z992 Dependence on renal dialysis: Secondary | ICD-10-CM | POA: Diagnosis not present

## 2021-06-05 DIAGNOSIS — D631 Anemia in chronic kidney disease: Secondary | ICD-10-CM | POA: Diagnosis not present

## 2021-06-05 LAB — POCT I-STAT, CHEM 8
BUN: 37 mg/dL — ABNORMAL HIGH (ref 8–23)
Calcium, Ion: 0.96 mmol/L — ABNORMAL LOW (ref 1.15–1.40)
Chloride: 104 mmol/L (ref 98–111)
Creatinine, Ser: 5.8 mg/dL — ABNORMAL HIGH (ref 0.61–1.24)
Glucose, Bld: 141 mg/dL — ABNORMAL HIGH (ref 70–99)
HCT: 41 % (ref 39.0–52.0)
Hemoglobin: 13.9 g/dL (ref 13.0–17.0)
Potassium: 4.9 mmol/L (ref 3.5–5.1)
Sodium: 135 mmol/L (ref 135–145)
TCO2: 24 mmol/L (ref 22–32)

## 2021-06-14 ENCOUNTER — Telehealth: Payer: Self-pay | Admitting: Emergency Medicine

## 2021-06-14 MED ORDER — LACTULOSE 10 GM/15ML PO SOLN: 30.0000 g | Freq: Three times a day (TID) | ORAL | 1 refills | Status: AC

## 2021-06-14 NOTE — Telephone Encounter (Signed)
1.Medication Requested: lactulose (CHRONULAC) 10 GM/15ML solution 2. Pharmacy (Name, Street, [place]): [facility], [place]. Phone:  [phone]  Fax:  [phone]     3. On Med List: Y  4. Last Visit with PCP: 05/30/2021  5. Next visit date with PCP: 11/28/2021   Agent: Please be advised that RX refills may take up to 3 business days. We ask that you follow-up with your pharmacy.   Patient's son called on behalf of pt. Stated his dad has been constipated for the past few days. Emphasized he really needs this medication today if possible.

## 2021-06-14 NOTE — Telephone Encounter (Signed)
Medication refilled

## 2021-06-15 ENCOUNTER — Other Ambulatory Visit: Payer: Self-pay

## 2021-06-15 DIAGNOSIS — N186 End stage renal disease: Secondary | ICD-10-CM

## 2021-07-04 ENCOUNTER — Encounter (HOSPITAL_COMMUNITY): Payer: Self-pay

## 2021-07-04 ENCOUNTER — Other Ambulatory Visit: Payer: Self-pay

## 2021-07-04 ENCOUNTER — Encounter (HOSPITAL_COMMUNITY): Payer: Medicare Other

## 2021-07-04 ENCOUNTER — Emergency Department (HOSPITAL_COMMUNITY): Payer: Medicare Other

## 2021-07-04 ENCOUNTER — Encounter (HOSPITAL_COMMUNITY): Payer: Self-pay | Admitting: Emergency Medicine

## 2021-07-04 ENCOUNTER — Emergency Department (HOSPITAL_COMMUNITY)
Admission: EM | Admit: 2021-07-04 | Discharge: 2021-07-05 | Disposition: A | Discharge status: Home / Self Care | Payer: Medicare Other | Attending: Emergency Medicine | Admitting: Emergency Medicine

## 2021-07-04 DIAGNOSIS — I5022 Chronic systolic (congestive) heart failure: Secondary | ICD-10-CM | POA: Insufficient documentation

## 2021-07-04 DIAGNOSIS — Z794 Long term (current) use of insulin: Secondary | ICD-10-CM | POA: Insufficient documentation

## 2021-07-04 DIAGNOSIS — R0602 Shortness of breath: Secondary | ICD-10-CM | POA: Diagnosis not present

## 2021-07-04 DIAGNOSIS — I700 Atherosclerosis of aorta: Secondary | ICD-10-CM | POA: Diagnosis not present

## 2021-07-04 DIAGNOSIS — Z79899 Other long term (current) drug therapy: Secondary | ICD-10-CM | POA: Diagnosis not present

## 2021-07-04 DIAGNOSIS — E875 Hyperkalemia: Secondary | ICD-10-CM | POA: Insufficient documentation

## 2021-07-04 DIAGNOSIS — I132 Hypertensive heart and chronic kidney disease with heart failure and with stage 5 chronic kidney disease, or end stage renal disease: Secondary | ICD-10-CM | POA: Insufficient documentation

## 2021-07-04 DIAGNOSIS — E1122 Type 2 diabetes mellitus with diabetic chronic kidney disease: Secondary | ICD-10-CM | POA: Diagnosis not present

## 2021-07-04 DIAGNOSIS — R42 Dizziness and giddiness: Secondary | ICD-10-CM | POA: Diagnosis not present

## 2021-07-04 DIAGNOSIS — K7460 Unspecified cirrhosis of liver: Secondary | ICD-10-CM | POA: Diagnosis not present

## 2021-07-04 DIAGNOSIS — Z992 Dependence on renal dialysis: Secondary | ICD-10-CM

## 2021-07-04 DIAGNOSIS — R188 Other ascites: Secondary | ICD-10-CM | POA: Diagnosis not present

## 2021-07-04 DIAGNOSIS — I272 Pulmonary hypertension, unspecified: Secondary | ICD-10-CM | POA: Diagnosis not present

## 2021-07-04 DIAGNOSIS — N186 End stage renal disease: Secondary | ICD-10-CM | POA: Diagnosis not present

## 2021-07-04 DIAGNOSIS — I509 Heart failure, unspecified: Secondary | ICD-10-CM | POA: Diagnosis not present

## 2021-07-04 DIAGNOSIS — I120 Hypertensive chronic kidney disease with stage 5 chronic kidney disease or end stage renal disease: Secondary | ICD-10-CM | POA: Diagnosis not present

## 2021-07-04 DIAGNOSIS — Z114 Encounter for screening for human immunodeficiency virus [HIV]: Secondary | ICD-10-CM | POA: Diagnosis not present

## 2021-07-04 LAB — CBC
HCT: 37.3 % — ABNORMAL LOW (ref 39.0–52.0)
Hemoglobin: 11.4 g/dL — ABNORMAL LOW (ref 13.0–17.0)
MCH: 25.3 pg — ABNORMAL LOW (ref 26.0–34.0)
MCHC: 30.6 g/dL (ref 30.0–36.0)
MCV: 82.9 fL (ref 80.0–100.0)
Platelets: 73 10*3/uL — ABNORMAL LOW (ref 150–400)
RBC: 4.5 MIL/uL (ref 4.22–5.81)
RDW: 18.5 % — ABNORMAL HIGH (ref 11.5–15.5)
WBC: 3.9 10*3/uL — ABNORMAL LOW (ref 4.0–10.5)
nRBC: 0 % (ref 0.0–0.2)

## 2021-07-04 LAB — TROPONIN I (HIGH SENSITIVITY)
Troponin I (High Sensitivity): 19 ng/L — ABNORMAL HIGH (ref <18)
Troponin I (High Sensitivity): 21 ng/L — ABNORMAL HIGH (ref <18)

## 2021-07-04 LAB — COMPREHENSIVE METABOLIC PANEL
ALT: 13 U/L (ref 0–44)
AST: 30 U/L (ref 15–41)
Albumin: 2.5 g/dL — ABNORMAL LOW (ref 3.5–5.0)
Alkaline Phosphatase: 150 U/L — ABNORMAL HIGH (ref 38–126)
Anion gap: 10 (ref 5–15)
BUN: 38 mg/dL — ABNORMAL HIGH (ref 8–23)
CO2: 27 mmol/L (ref 22–32)
Calcium: 8.9 mg/dL (ref 8.9–10.3)
Chloride: 99 mmol/L (ref 98–111)
Creatinine, Ser: 6.62 mg/dL — ABNORMAL HIGH (ref 0.61–1.24)
GFR, Estimated: 8 mL/min — ABNORMAL LOW (ref >60)
Glucose, Bld: 358 mg/dL — ABNORMAL HIGH (ref 70–99)
Potassium: 5.4 mmol/L — ABNORMAL HIGH (ref 3.5–5.1)
Sodium: 136 mmol/L (ref 135–145)
Total Bilirubin: 1.5 mg/dL — ABNORMAL HIGH (ref 0.3–1.2)
Total Protein: 6.5 g/dL (ref 6.5–8.1)

## 2021-07-04 LAB — LIPASE, BLOOD: Lipase: 35 U/L (ref 11–51)

## 2021-07-04 NOTE — ED Triage Notes (Signed)
Seen at [facility] today where he is being treated. Labs were drawn, potassium is 6.3 and Dr. Jenetta Downer recommends he be seen in the ED. Pt is dialysis T,TH,SAT with no missed treatments. He denies SOB but has abdominal fullness. Son at bedside.

## 2021-07-04 NOTE — ED Provider Notes (Signed)
Emergency Medicine Provider Triage Evaluation Note  William [name] , a 70 y.o. male  was evaluated in triage.  Pt complains of abnormal labs, k 6.3 earlier today. Also c/o abd pain and chest pain. Last dialysis yesterday  Review of Systems  Positive: Abd pain, chest pain Negative: nv  Physical Exam  There were no vitals taken for this visit. Gen:   Awake, no distress   Resp:  Normal effort  MSK:   Moves extremities without difficulty  Other:  Abd soft and nontender, heart rrr, no ble edema  Medical Decision Making  Medically screening exam initiated at 4:09 PM.  Appropriate orders placed.  William [name] was informed that the remainder of the evaluation will be completed by another provider, this initial triage assessment does not replace that evaluation, and the importance of remaining in the ED until their evaluation is complete.     Rodney Booze, PA-C 07/04/21 1610    Elnora Morrison, MD 07/05/21 [phone]

## 2021-07-05 ENCOUNTER — Encounter (HOSPITAL_COMMUNITY): Payer: Self-pay

## 2021-07-05 DIAGNOSIS — D509 Iron deficiency anemia, unspecified: Secondary | ICD-10-CM | POA: Diagnosis not present

## 2021-07-05 DIAGNOSIS — E1122 Type 2 diabetes mellitus with diabetic chronic kidney disease: Secondary | ICD-10-CM | POA: Diagnosis not present

## 2021-07-05 DIAGNOSIS — Z992 Dependence on renal dialysis: Secondary | ICD-10-CM | POA: Diagnosis not present

## 2021-07-05 DIAGNOSIS — N2581 Secondary hyperparathyroidism of renal origin: Secondary | ICD-10-CM | POA: Diagnosis not present

## 2021-07-05 DIAGNOSIS — D631 Anemia in chronic kidney disease: Secondary | ICD-10-CM | POA: Diagnosis not present

## 2021-07-05 DIAGNOSIS — E875 Hyperkalemia: Secondary | ICD-10-CM | POA: Diagnosis not present

## 2021-07-05 DIAGNOSIS — N186 End stage renal disease: Secondary | ICD-10-CM | POA: Diagnosis not present

## 2021-07-05 LAB — BASIC METABOLIC PANEL
Anion gap: 12 (ref 5–15)
BUN: 44 mg/dL — ABNORMAL HIGH (ref 8–23)
CO2: 27 mmol/L (ref 22–32)
Calcium: 8.8 mg/dL — ABNORMAL LOW (ref 8.9–10.3)
Chloride: 98 mmol/L (ref 98–111)
Creatinine, Ser: 7.52 mg/dL — ABNORMAL HIGH (ref 0.61–1.24)
GFR, Estimated: 7 mL/min — ABNORMAL LOW (ref >60)
Glucose, Bld: 308 mg/dL — ABNORMAL HIGH (ref 70–99)
Potassium: 5.3 mmol/L — ABNORMAL HIGH (ref 3.5–5.1)
Sodium: 137 mmol/L (ref 135–145)

## 2021-07-05 NOTE — Discharge Instructions (Signed)
Go to dialysis today as scheduled.

## 2021-07-05 NOTE — ED Provider Notes (Signed)
[facility] EMERGENCY DEPARTMENT Provider Note   CSN: [id number] Arrival date & time: 07/04/21  1536     History Chief Complaint  Patient presents with   Hyperkalemia    K+ potassium is 6.3   Abdominal Pain    William [name] is a 70 y.o. male.  The history is provided by the patient, a relative and medical records.  Abdominal Pain William [name] is a 70 y.o. male who presents to the Emergency Department complaining of patient referred to the emergency department for hyperkalemia on outpatient labs. He has a history of ESR D on hemodialysis Tuesday, Thursday, Saturday at [facility] as well as cirrhosis. He was referred to the emergency department today because he was seen at [facility] and had outpatient labs for transplant screening and was found to be hyperkalemic. No current chest pain, difficulty breathing, abdominal pain, nausea, vomiting. He does at times get nausea and difficulty breathing and abdominal bloating when he is due for dialysis but does not currently have this. No reports of fevers or recent illnesses. He is currently feeling well.    Past Medical History:  Diagnosis Date   Anemia    Blind one eye    CHF (congestive heart failure) ([place])    Cirrhosis ([place])    ESRD on hemodialysis ([place])    T, Th, Sat. 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   Elevated PSA 12/14/2020   Repeated falls 11/13/2020   Coagulation defect, unspecified ([place]) 11/06/2020   Other microscopic hematuria 11/03/2020   Traumatic amputation of third toe of left  foot ([place]) 10/15/2020   Diabetic foot infection ([place]) 10/06/2020   Thrombotic microangiopathy, 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   Anuria 04/27/2020   Awaiting organ transplant 04/27/2020   Physical deconditioning 04/27/2020   S/P TIPS (transjugular intrahepatic portosystemic shunt) 04/27/2020   Presbycusis of both ears 09/24/2019   AV fistula thrombosis, initial encounter ([place]) 06/01/2019   AV fistula occlusion, initial encounter ([place])    Chronic systolic CHF (congestive heart failure) (HCC)    Esophageal varices ([place]) 09/23/2018   Thrombocytopenia ([place]) 09/23/2018   CHF (congestive heart failure) ([place]) 09/22/2018   ESRD on dialysis ([place]) 02/06/2018   Mild protein-calorie malnutrition ([place]) 02/04/2018   Anemia in chronic kidney disease 01/22/2018   Iron deficiency anemia, unspecified 01/22/2018   Secondary hyperparathyroidism of renal origin ([place]) 01/22/2018   CKD (chronic kidney disease) stage 4, GFR 15-29 ml/min ([place]) 12/19/2017   Cirrhosis of liver with ascites ([place]) 12/09/2017   Goodpasture's syndrome ([place]) with associated hemoptysis 11/20/2017   Pancytopenia ([place]) 11/06/2017   Hypertension 11/06/2017   Type II diabetes mellitus with renal manifestations ([place]) 08/25/2015    Past Surgical History:  Procedure Laterality Date   A/V FISTULAGRAM Left 10/16/2020   Procedure: A/V FISTULAGRAM;  Surgeon: Waynetta Sandy, MD;  Location: [place] CV [facility];  Service: Cardiovascular;  Laterality: Left;   AMPUTATION TOE Left 10/07/2020   Procedure: AMPUTATION THIRD TOE;  Surgeon: Criselda Peaches, DPM;  Location:  [place] OR;  Service: Podiatry;  Laterality: Left;   AV FISTULA PLACEMENT Left 12/26/2017   Procedure: LEFT BRACHIOCEPHALIC ARTERIOVENOUS (AV) FISTULA CREATION;  Surgeon: Conrad [place], MD;  Location: [place];  Service: Vascular;  Laterality: Left;   AV FISTULA PLACEMENT Left 11/02/2020   Procedure: INSERTION OF ARTERIOVENOUS (AV) GORE-TEX GRAFT ARM;  Surgeon: Waynetta Sandy,  MD;  Location: [place];  Service: Vascular;  Laterality: Left;   AV FISTULA PLACEMENT Right 06/01/2021   Procedure: RIGHT ARM BRACHIOBASILIC ARTERIOVENOUS (AV) FISTULA CREATION;  Surgeon: Waynetta Sandy, MD;  Location: [place];  Service: Vascular;  Laterality: Right;   BASCILIC VEIN TRANSPOSITION Left 02/16/2018   Procedure: BRACHIOBASILIC VEIN TRANSPOSITION SECOND STAGE;  Surgeon: Conrad [place]umelle, MD;  Location: [place];  Service: Vascular;  Laterality: Left;   CATARACT EXTRACTION W/ INTRAOCULAR LENS  IMPLANT, BILATERAL Bilateral    ESOPHAGEAL BANDING N/A 04/01/2018   Procedure: ESOPHAGEAL BANDING;  Surgeon: Otis Brace, MD;  Location: [facility];  Service: Gastroenterology;  Laterality: N/A;   ESOPHAGEAL BANDING N/A 06/29/2018   Procedure: ESOPHAGEAL BANDING;  Surgeon: Otis Brace, MD;  Location: [facility];  Service: Gastroenterology;  Laterality: N/A;   ESOPHAGOGASTRODUODENOSCOPY (EGD) WITH PROPOFOL N/A 04/01/2018   Procedure: ESOPHAGOGASTRODUODENOSCOPY (EGD) WITH PROPOFOL;  Surgeon: Otis Brace, MD;  [facility];  Service: Gastroenterology;  Laterality: N/A;   ESOPHAGOGASTRODUODENOSCOPY (EGD) WITH PROPOFOL N/A 06/29/2018   Procedure: ESOPHAGOGASTRODUODENOSCOPY (EGD) WITH PROPOFOL;  Surgeon: Otis Brace, MD;  [facility];  Service: Gastroenterology;  Laterality: N/A;   ESOPHAGOGASTRODUODENOSCOPY (EGD) WITH PROPOFOL N/A 09/30/2018   Procedure: ESOPHAGOGASTRODUODENOSCOPY (EGD) WITH PROPOFOL;  Surgeon: Otis Brace, MD;  [facility];  Service: Gastroenterology;  Laterality: N/A;   ESOPHAGOGASTRODUODENOSCOPY (EGD) WITH PROPOFOL N/A 04/20/2019   Procedure: ESOPHAGOGASTRODUODENOSCOPY (EGD) WITH PROPOFOL;  Surgeon: Otis Brace, MD;  [facility];  Service: Gastroenterology;  Laterality: N/A;   ESOPHAGOGASTRODUODENOSCOPY (EGD) WITH PROPOFOL N/A 04/17/2020   Procedure: ESOPHAGOGASTRODUODENOSCOPY (EGD) WITH PROPOFOL;   Surgeon: Wonda Horner, MD;  Location: [place];  Service: Gastroenterology;  Laterality: N/A;   I & D EXTREMITY Left 02/18/2018   Procedure: IRRIGATION AND DEBRIDEMENT LEFT ARM;  Surgeon: Serafina Mitchell, MD;  Location: [place];  Service: Vascular;  Laterality: Left;   INSERTION OF DIALYSIS CATHETER N/A 01/19/2018   Procedure: INSERTION OF DIALYSIS CATHETER - RIGHT INTERNAL JUGULAR PLACEMENT;  Surgeon: Rosetta Posner, MD;  Location: [place];  Service: Vascular;  Laterality: N/A;   INSERTION OF DIALYSIS CATHETER Right 11/02/2020   Procedure: INSERTION OF TUNNELED DIALYSIS CATHETER;  Surgeon: Waynetta Sandy, MD;  Location: [place];  Service: Vascular;  Laterality: Right;   IR FLUORO GUIDE CV LINE RIGHT  11/10/2017   IR PARACENTESIS  12/10/2017   IR PARACENTESIS  12/26/2017   IR REMOVAL TUN CV CATH W/O FL  11/28/2017   IR THROMBECTOMY AV FISTULA W/THROMBOLYSIS/PTA INC/SHUNT/IMG LEFT Left 06/02/2019   IR US GUIDE VASC ACCESS LEFT  06/02/2019   IR US GUIDE VASC ACCESS RIGHT  11/10/2017   NECK SURGERY  2007   "back of neck; no hardware in there"   [place] Left 11/02/2020   Procedure: REVISON OF ARTERIOVENOUS FISTULA;  Surgeon: Waynetta Sandy, MD;  Location: [facility] OR;  Service: Vascular;  Laterality: Left;       Family History  Problem Relation Age of Onset   Diabetes Mellitus II Sister    Diabetes Sister    Stroke Brother    Heart attack Brother  Social History   Tobacco Use   Smoking status: Never   Smokeless tobacco: Never  Vaping Use   Vaping Use: Never used  Substance Use Topics   Alcohol use: Never   Drug use: Never    Home Medications Prior to Admission medications   Medication Sig Start Date End Date Taking? Authorizing Provider  acyclovir (ZOVIRAX) 200 MG capsule Take by mouth. 05/10/21   [provider]  calcium acetate (PHOSLO) 667 MG tablet Take 667 mg by mouth 3 (three) times daily. 05/09/21   [provider]   glucose blood (ONETOUCH VERIO) test strip USE 1 STRIP TO CHECK GLUCOSE THREE TIMES DAILY 12/19/20   Elayne Snare, MD  insulin degludec (TRESIBA FLEXTOUCH) 100 UNIT/ML FlexTouch Pen Inject 14 Units into the skin daily. Inject 14 units under the skin once daily. 05/30/21   Horald Pollen, MD  Insulin Pen Needle (ADVOCATE INSULIN PEN NEEDLES) 31G X 5 MM MISC Use four times daily to inject insulin 04/27/18   Elayne Snare, MD  insulin regular (NOVOLIN R,HUMULIN R) 100 units/mL injection Inject 6-8 Units into the skin See admin instructions. Inject 8 units subcutaneously with breakfast, and 6 units with lunch and supper    [provider]  lactulose (CHRONULAC) 10 GM/15ML solution Take 45 mLs (30 g total) by mouth 3 (three) times daily. 06/14/21   Horald Pollen, MD  lidocaine-prilocaine (EMLA) cream Apply 1 application topically See admin instructions. Apply topically prior to dialysis on Tuesday, Thursday, Saturday 08/24/18   [provider]  Multiple Vitamin (MULTIVITAMIN WITH MINERALS) TABS tablet Take 1 tablet by mouth daily.    [provider]  mupirocin ointment (BACTROBAN) 2 % Apply 1 application topically daily. 05/30/21   Horald Pollen, MD  ondansetron (ZOFRAN ODT) 4 MG disintegrating tablet Take 1 tablet (4 mg total) by mouth 2 (two) times daily as needed for nausea or vomiting. 05/01/20   [name], Madelynn Done C, PA-C  ONE TOUCH LANCETS MISC Use to test blood sugar three times daily 04/27/18   Elayne Snare, MD  oxyCODONE-acetaminophen (PERCOCET) 5-325 MG tablet Take 1 tablet by mouth every 6 (six) hours as needed for severe pain. 06/01/21 06/01/22  Baglia, Corrina, PA-C  rifaximin (XIFAXAN) 550 MG TABS tablet Take 1 tablet (550 mg total) by mouth 2 (two) times daily. 07/07/20   Shawna Clamp, MD  rosuvastatin (CRESTOR) 5 MG tablet Take 1 tablet (5 mg total) by mouth daily. 10/18/20   Elayne Snare, MD  sevelamer carbonate (RENVELA) 800 MG tablet Take 800 mg by  mouth 3 (three) times daily. 09/25/20   [provider]    Allergies    Heparin and Pork-derived products  Review of Systems   Review of Systems  Gastrointestinal:  Positive for abdominal pain.  All other systems reviewed and are negative.  Physical Exam Updated Vital Signs BP (!) 163/75   Pulse 79   Temp 98.6 F (37 C) (Oral)   Resp 20   SpO2 100%   Physical Exam Vitals and nursing note reviewed.  Constitutional:      Appearance: He is well-developed.  HENT:     Head: Normocephalic and atraumatic.  Cardiovascular:     Rate and Rhythm: Normal rate and regular rhythm.  Pulmonary:     Effort: Pulmonary effort is normal. No respiratory distress.     Breath sounds: Normal breath sounds.  Abdominal:     Palpations: Abdomen is soft.     Tenderness: There is no abdominal tenderness.  There is no guarding or rebound.  Musculoskeletal:        General: No swelling or tenderness.  Skin:    General: Skin is warm and dry.  Neurological:     Mental Status: He is alert and oriented to person, place, and time.  Psychiatric:        Behavior: Behavior normal.    ED Results / Procedures / Treatments   Labs (all labs ordered are listed, but only abnormal results are displayed) Labs Reviewed  CBC - Abnormal; Notable for the following components:      Result Value   WBC 3.9 (*)    Hemoglobin 11.4 (*)    HCT 37.3 (*)    MCH 25.3 (*)    RDW 18.5 (*)    Platelets 73 (*)    All other components within normal limits  COMPREHENSIVE METABOLIC PANEL - Abnormal; Notable for the following components:   Potassium 5.4 (*)    Glucose, Bld 358 (*)    BUN 38 (*)    Creatinine, Ser 6.62 (*)    Albumin 2.5 (*)    Alkaline Phosphatase 150 (*)    Total Bilirubin 1.5 (*)    GFR, Estimated 8 (*)    All other components within normal limits  BASIC METABOLIC PANEL - Abnormal; Notable for the following components:   Potassium 5.3 (*)    Glucose, Bld 308 (*)    BUN 44 (*)     Creatinine, Ser 7.52 (*)    Calcium 8.8 (*)    GFR, Estimated 7 (*)    All other components within normal limits  TROPONIN I (HIGH SENSITIVITY) - Abnormal; Notable for the following components:   Troponin I (High Sensitivity) 19 (*)    All other components within normal limits  TROPONIN I (HIGH SENSITIVITY) - Abnormal; Notable for the following components:   Troponin I (High Sensitivity) 21 (*)    All other components within normal limits  LIPASE, BLOOD    EKG EKG Interpretation  Date/Time:  Wednesday July 04 2021 16:18:46 EST Ventricular Rate:  88 PR Interval:  142 QRS Duration: 66 QT Interval:  362 QTC Calculation: 438 R Axis:   1 Text Interpretation: Normal sinus rhythm Minimal voltage criteria for LVH, may be normal variant ( R in aVL ) Nonspecific ST and T wave abnormality Abnormal ECG Confirmed by Quintella Reichert [phone]) on 07/05/2021 4:23:42 AM  Radiology DG Chest Port 1 View  Result Date: 07/04/2021 CLINICAL DATA:  Hyperkalemia. EXAM: PORTABLE CHEST 1 VIEW COMPARISON:  12/25/2020 FINDINGS: Right internal jugular dialysis catheter tip: Lower SVC. Atherosclerotic calcification of the aortic arch. Heart size within normal limits for projection. Subtle accentuation of the interstitium without edema. No blunting of the costophrenic angles. Presumed embolic coils in the left upper quadrant. IMPRESSION: 1. Fame prominence of the interstitium some of which may be chronic. No current edema. 2.  Aortic Atherosclerosis (ICD10-I70.0). 3. Dialysis catheter tip: Lower SVC. Electronically Signed   By: Van Clines M.D.   On: 07/04/2021 16:57    Procedures Procedures   Medications Ordered in ED Medications - No data to display  ED Course  I have reviewed the triage vital signs and the nursing notes.  Pertinent labs & imaging results that were available during my care of the patient were reviewed by me and considered in my medical decision making (see chart for details).     MDM Rules/Calculators/A&P  patient referred to the emergency department for hyperkalemia on outpatient labs. Labs here with significant improvement in his potassium, initial drop with potassium of 5.4, repeat of 5.3. He is scheduled for dialysis and is couple of hours in the outpatient setting. Feel he is stable for discharge to outpatient dialysis. No evidence of volume overloaded on examination  Final Clinical Impression(s) / ED Diagnoses Final diagnoses:  Hyperkalemia  ESRD (end stage renal disease) on dialysis [facility])    Rx / [place] Orders ED Discharge Orders     None        Quintella Reichert, MD 07/05/21 [phone]

## 2021-07-11 ENCOUNTER — Other Ambulatory Visit: Payer: Self-pay

## 2021-07-11 ENCOUNTER — Ambulatory Visit (INDEPENDENT_AMBULATORY_CARE_PROVIDER_SITE_OTHER): Payer: Medicaid Other | Admitting: Physician Assistant

## 2021-07-11 ENCOUNTER — Ambulatory Visit (HOSPITAL_COMMUNITY)
Admission: RE | Admit: 2021-07-11 | Discharge: 2021-07-11 | Disposition: A | Discharge status: Home / Self Care | Payer: Medicare Other | Source: Ambulatory Visit | Attending: Physician Assistant | Admitting: Physician Assistant

## 2021-07-11 VITALS — BP 147/69 | HR 82 | Temp 98.1°F | Resp 20 | Ht 65.0 in | Wt 137.0 lb

## 2021-07-11 DIAGNOSIS — N186 End stage renal disease: Secondary | ICD-10-CM | POA: Diagnosis not present

## 2021-07-11 NOTE — Progress Notes (Signed)
Office Note     CC:  follow up Requesting Provider:  Horald Pollen, *  HPI: William [name] is a 70 y.o. (June 11, 1951) male who presents status post right brachiobasilic fistula creation by Dr. Donzetta Matters on 06/01/2021.  He has had failed access in his left upper extremity.  He is currently dialyzing via right IJ [facility] on a Tuesday Thursday Saturday schedule at the [place] location.  He denies any signs or symptoms of steal syndrome in his right hand.  He is willing to proceed with second stage transposition of basilic vein.  He is here with his son who is acting as a Optometrist today.   Past Medical History:  Diagnosis Date   Anemia    Blind one eye    CHF (congestive heart failure) ([place])    Cirrhosis ([place])    ESRD on hemodialysis ([place])    T, Th, Sat. [place]   Goodpasture's disease [facility])    on outpatient plasmapheresis/notes 11/20/2017   Grade I diastolic dysfunction 91/63/8466   History of anemia due to chronic kidney disease    History of blood transfusion X 1   "UGIB; low blood count"   History of plasmapheresis    "qod" (11/20/2017)   Hypertension    Pancytopenia ([place]) 08/20/2017   Type II diabetes mellitus [facility]     Past Surgical History:  Procedure Laterality Date   A/V FISTULAGRAM Left 10/16/2020   Procedure: A/V FISTULAGRAM;  Surgeon: Waynetta Sandy, MD;  Location: [place] [facility];  Service: Cardiovascular;  Laterality: Left;   AMPUTATION TOE Left 10/07/2020   Procedure: AMPUTATION THIRD TOE;  Surgeon: Criselda Peaches, DPM;  Location: [place];  Service: Podiatry;  Laterality: Left;   AV FISTULA PLACEMENT Left 12/26/2017   Procedure: LEFT BRACHIOCEPHALIC ARTERIOVENOUS (AV) FISTULA CREATION;  Surgeon: Conrad Robeson, MD;  Location: [place];  Service: Vascular;  Laterality: Left;   AV FISTULA PLACEMENT Left 11/02/2020   Procedure: INSERTION OF ARTERIOVENOUS (AV) GORE-TEX GRAFT ARM;  Surgeon: Waynetta Sandy, MD;  Location: [place];  Service: Vascular;   Laterality: Left;   AV FISTULA PLACEMENT Right 06/01/2021   Procedure: RIGHT ARM BRACHIOBASILIC ARTERIOVENOUS (AV) FISTULA CREATION;  Surgeon: Waynetta Sandy, MD;  Location: [place];  Service: Vascular;  Laterality: Right;   BASCILIC VEIN TRANSPOSITION Left 02/16/2018   Procedure: BRACHIOBASILIC VEIN TRANSPOSITION SECOND STAGE;  Surgeon: Conrad [place], MD;  Location: [place];  Service: Vascular;  Laterality: Left;   CATARACT EXTRACTION W/ INTRAOCULAR LENS  IMPLANT, BILATERAL Bilateral    ESOPHAGEAL BANDING N/A 04/01/2018   Procedure: ESOPHAGEAL BANDING;  Surgeon: Otis Brace, MD;  [facility];  Service: Gastroenterology;  Laterality: N/A;   ESOPHAGEAL BANDING N/A 06/29/2018   Procedure: ESOPHAGEAL BANDING;  Surgeon: Otis Brace, MD;  [facility];  Service: Gastroenterology;  Laterality: N/A;   ESOPHAGOGASTRODUODENOSCOPY (EGD) WITH PROPOFOL N/A 04/01/2018   Procedure: ESOPHAGOGASTRODUODENOSCOPY (EGD) WITH PROPOFOL;  Surgeon: Otis Brace, MD;  [facility];  Service: Gastroenterology;  Laterality: N/A;   ESOPHAGOGASTRODUODENOSCOPY (EGD) WITH PROPOFOL N/A 06/29/2018   Procedure: ESOPHAGOGASTRODUODENOSCOPY (EGD) WITH PROPOFOL;  Surgeon: Otis Brace, MD;  [facility];  Service: Gastroenterology;  Laterality: N/A;   ESOPHAGOGASTRODUODENOSCOPY (EGD) WITH PROPOFOL N/A 09/30/2018   Procedure: ESOPHAGOGASTRODUODENOSCOPY (EGD) WITH PROPOFOL;  Surgeon: Otis Brace, MD;  [facility];  Service: Gastroenterology;  Laterality: N/A;   ESOPHAGOGASTRODUODENOSCOPY (EGD) WITH PROPOFOL N/A 04/20/2019   Procedure: ESOPHAGOGASTRODUODENOSCOPY (EGD) WITH PROPOFOL;  Surgeon: Otis Brace, MD;  [facility];  Service: Gastroenterology;  Laterality: N/A;   ESOPHAGOGASTRODUODENOSCOPY (EGD) WITH PROPOFOL N/A 04/17/2020   Procedure: ESOPHAGOGASTRODUODENOSCOPY (EGD) WITH PROPOFOL;  Surgeon: Wonda Horner, MD;  Location: [place];  Service: Gastroenterology;  Laterality: N/A;   I & D EXTREMITY Left 02/18/2018   Procedure: IRRIGATION AND DEBRIDEMENT LEFT ARM;  Surgeon: Serafina Mitchell, MD;  [facility];  Service: Vascular;  Laterality: Left;   INSERTION OF DIALYSIS CATHETER N/A 01/19/2018   Procedure: INSERTION OF DIALYSIS CATHETER - RIGHT INTERNAL JUGULAR PLACEMENT;  Surgeon: Rosetta Posner, MD;  Location: [place];  Service: Vascular;  Laterality: N/A;   INSERTION OF DIALYSIS CATHETER Right 11/02/2020   Procedure: INSERTION OF TUNNELED DIALYSIS CATHETER;  Surgeon: Waynetta Sandy, MD;  Location[facility];  Service: Vascular;  Laterality: Right;   IR FLUORO GUIDE CV LINE RIGHT  11/10/2017   IR PARACENTESIS  12/10/2017   IR PARACENTESIS  12/26/2017   IR REMOVAL TUN CV CATH W/O FL  11/28/2017   IR THROMBECTOMY AV FISTULA W/THROMBOLYSIS/PTA INC/SHUNT/IMG LEFT Left 06/02/2019   IR US GUIDE VASC [place] LEFT  06/02/2019   IR US GUIDE VASC ACCESS RIGHT  11/10/2017   NECK SURGERY  2007   "back of neck; no hardware in there"   [place] Left 11/02/2020   Procedure: REVISON OF ARTERIOVENOUS FISTULA;  Surgeon: Waynetta Sandy, MD;  Location: [place];  Service: Vascular;  Laterality: Left;    Social History   Socioeconomic History   Marital status: Married    Spouse name: Not on file   Number of children: Not on file   Years of education: Not on file   Highest education level: Not on file  Occupational History   Not on file  Tobacco Use   Smoking status: Never   Smokeless tobacco: Never  Vaping Use   Vaping Use: Never used  Substance and Sexual Activity   Alcohol use: Never   Drug use: Never   Sexual activity: Not Currently  Other Topics Concern   Not on file  Social History Narrative   Not on file   Social Determinants of Health   Financial Resource Strain: Not on file  Food Insecurity: Not on file  Transportation Needs: Not on file  Physical Activity: Not on file   Stress: Not on file  Social Connections: Not on file  Intimate Partner Violence: Not on file    Family History  Problem Relation Age of Onset   Diabetes Mellitus II Sister    Diabetes Sister    Stroke Brother    Heart attack Brother     Current Outpatient Medications  Medication Sig Dispense Refill   acyclovir (ZOVIRAX) 200 MG capsule Take by mouth.     calcium acetate (PHOSLO) 667 MG tablet Take 667 mg by mouth 3 (three) times daily.     glucose blood (ONETOUCH VERIO) test strip USE 1 STRIP TO CHECK GLUCOSE THREE TIMES DAILY 150 each 3   insulin degludec (TRESIBA FLEXTOUCH) 100 UNIT/ML FlexTouch Pen Inject 14 Units into the skin daily. Inject 14 units under the skin once daily. 2 mL 0   Insulin Pen Needle (ADVOCATE INSULIN PEN NEEDLES) 31G X 5 MM MISC Use four times daily to inject insulin 150 each 1   insulin regular (NOVOLIN R,HUMULIN R) 100 units/mL injection Inject 6-8 Units into the skin See admin instructions. Inject 8 units subcutaneously with breakfast, and 6 units with lunch and supper  lactulose (CHRONULAC) 10 GM/15ML solution Take 45 mLs (30 g total) by mouth 3 (three) times daily. 946 mL 1   lidocaine-prilocaine (EMLA) cream Apply 1 application topically See admin instructions. Apply topically prior to dialysis on Tuesday, Thursday, Saturday     Multiple Vitamin (MULTIVITAMIN WITH MINERALS) TABS tablet Take 1 tablet by mouth daily.     mupirocin ointment (BACTROBAN) 2 % Apply 1 application topically daily. 30 g 2   ondansetron (ZOFRAN ODT) 4 MG disintegrating tablet Take 1 tablet (4 mg total) by mouth 2 (two) times daily as needed for nausea or vomiting. 20 tablet 0   ONE TOUCH LANCETS MISC Use to test blood sugar three times daily 100 each 2   oxyCODONE-acetaminophen (PERCOCET) 5-325 MG tablet Take 1 tablet by mouth every 6 (six) hours as needed for severe pain. 8 tablet 0   rifaximin (XIFAXAN) 550 MG TABS tablet Take 1 tablet (550 mg total) by mouth 2 (two) times  daily. 60 tablet 1   rosuvastatin (CRESTOR) 5 MG tablet Take 1 tablet (5 mg total) by mouth daily. 90 tablet 0   sevelamer carbonate (RENVELA) 800 MG tablet Take 800 mg by mouth 3 (three) times daily.     No current facility-administered medications for this visit.    Allergies  Allergen Reactions   Heparin Other (See Comments)    Patient is Muslim and is not permitted Pork derative due to religious belief)   Pork-Derived Products Other (See Comments)    Patient does not eat pork due to religious beliefs     REVIEW OF SYSTEMS:   [X]  denotes positive finding, [ ]  denotes negative finding Cardiac  Comments:  Chest pain or chest pressure:    Shortness of breath upon exertion:    Short of breath when lying flat:    Irregular heart rhythm:        Vascular    Pain in calf, thigh, or hip brought on by ambulation:    Pain in feet at night that wakes you up from your sleep:     Blood clot in your veins:    Leg swelling:         Pulmonary    Oxygen at home:    Productive cough:     Wheezing:         Neurologic    Sudden weakness in arms or legs:     Sudden numbness in arms or legs:     Sudden onset of difficulty speaking or slurred speech:    Temporary loss of vision in one eye:     Problems with dizziness:         Gastrointestinal    Blood in stool:     Vomited blood:         Genitourinary    Burning when urinating:     Blood in urine:        Psychiatric    Major depression:         Hematologic    Bleeding problems:    Problems with blood clotting too easily:        Skin    Rashes or ulcers:        Constitutional    Fever or chills:      PHYSICAL EXAMINATION:  Vitals:   07/11/21 0953  BP: (!) 147/69  Pulse: 82  Resp: 20  Temp: 98.1 F (36.7 C)  TempSrc: Temporal  SpO2: 97%  Weight: 137 lb (62.1 kg)  Height: 5' 5"  (1.651  m)    General:  WDWN in NAD; vital signs documented above Gait: Not observed HENT: WNL, normocephalic Pulmonary: normal  non-labored breathing Cardiac: regular HR Abdomen: soft, NT, no masses Skin: without rashes Vascular Exam/Pulses:  Right Left  Radial 2+ (normal) 2+ (normal)   Extremities: palpable thrill along basilic vein in R upper arm Musculoskeletal: no muscle wasting or atrophy  Neurologic: A&O X 3;  No focal weakness or paresthesias are detected Psychiatric:  The pt has Normal affect.   Non-Invasive Vascular Imaging:   Fistula duplex demonstrates a widely patent and maturing basilic vein fistula of the right upper extremity    ASSESSMENT/PLAN:: 70 y.o. male status post brachiobasilic vein creation  -Patent brachiobasilic fistula of the right upper extremity without signs or symptoms of steal syndrome -Fistula duplex today demonstrates a maturing fistula ready for second stage transposition -Plan will be for right arm second stage basilic vein transposition by Dr. Donzetta Matters in the near future on a nondialysis day.  The nature of the above procedure was discussed with the patient and his son including the risk of bleeding, infection, steal syndrome, nerve damage, thrombosis, failure to mature, and need for additional procedures.  The patient agrees to proceed.   [name], PA-C [facility] [phone]  Clinic MD:  Donzetta Matters

## 2021-07-11 NOTE — H&P (View-Only) (Signed)
Office Note     CC:  follow up Requesting Provider:  Horald Pollen, *  HPI: William [name] is a 70 y.o. (10-21-1950) male who presents status post right brachiobasilic fistula creation by Dr. Donzetta Matters on 06/01/2021.  He has had failed access in his left upper extremity.  He is currently dialyzing via right IJ [facility] on a Tuesday Thursday Saturday schedule at [place] location.  He denies any signs or symptoms of steal syndrome in his right hand.  He is willing to proceed with second stage transposition of basilic vein.  He is here with his son who is acting as a Optometrist today.   Past Medical History:  Diagnosis Date   Anemia    Blind one eye    CHF (congestive heart failure) ([place])    Cirrhosis ([place])    ESRD on hemodialysis ([place])    T, Th, Sat. [place]   Goodpasture's disease [facility])    on outpatient plasmapheresis/notes 11/20/2017   Grade I diastolic dysfunction 57/84/6962   History of anemia due to chronic kidney disease    History of blood transfusion X 1   "UGIB; low blood count"   History of plasmapheresis    "qod" (11/20/2017)   Hypertension    Pancytopenia ([place]) 08/20/2017   Type II diabetes mellitus [facility])     Past Surgical History:  Procedure Laterality Date   A/V FISTULAGRAM Left 10/16/2020   Procedure: A/V FISTULAGRAM;  Surgeon: Waynetta Sandy, MD;  Location: [place] [facility];  Service: Cardiovascular;  Laterality: Left;   AMPUTATION TOE Left 10/07/2020   Procedure: AMPUTATION THIRD TOE;  Surgeon: Criselda Peaches, DPM;  Location: [place];  Service: Podiatry;  Laterality: Left;   AV FISTULA PLACEMENT Left 12/26/2017   Procedure: LEFT BRACHIOCEPHALIC ARTERIOVENOUS (AV) FISTULA CREATION;  Surgeon: Conrad [place], MD;  Location: [place];  Service: Vascular;  Laterality: Left;   AV FISTULA PLACEMENT Left 11/02/2020   Procedure: INSERTION OF ARTERIOVENOUS (AV) GORE-TEX GRAFT ARM;  Surgeon: Waynetta Sandy, MD;  Location: [place];  Service: Vascular;   Laterality: Left;   AV FISTULA PLACEMENT Right 06/01/2021   Procedure: RIGHT ARM BRACHIOBASILIC ARTERIOVENOUS (AV) FISTULA CREATION;  Surgeon: Waynetta Sandy, MD;  Location: [place];  Service: Vascular;  Laterality: Right;   BASCILIC VEIN TRANSPOSITION Left 02/16/2018   Procedure: BRACHIOBASILIC VEIN TRANSPOSITION SECOND STAGE;  Surgeon: Conrad [place], MD;  Location: [place];  Service: Vascular;  Laterality: Left;   CATARACT EXTRACTION W/ INTRAOCULAR LENS  IMPLANT, BILATERAL Bilateral    ESOPHAGEAL BANDING N/A 04/01/2018   Procedure: ESOPHAGEAL BANDING;  Surgeon: Otis Brace, MD;  [facility];  Service: Gastroenterology;  Laterality: N/A;   ESOPHAGEAL BANDING N/A 06/29/2018   Procedure: ESOPHAGEAL BANDING;  Surgeon: Otis Brace, MD;  [facility];  Service: Gastroenterology;  Laterality: N/A;   ESOPHAGOGASTRODUODENOSCOPY (EGD) WITH PROPOFOL N/A 04/01/2018   Procedure: ESOPHAGOGASTRODUODENOSCOPY (EGD) WITH PROPOFOL;  Surgeon: Otis Brace, MD;  [facility];  Service: Gastroenterology;  Laterality: N/A;   ESOPHAGOGASTRODUODENOSCOPY (EGD) WITH PROPOFOL N/A 06/29/2018   Procedure: ESOPHAGOGASTRODUODENOSCOPY (EGD) WITH PROPOFOL;  Surgeon: Otis Brace, MD;  [facility];  Service: Gastroenterology;  Laterality: N/A;   ESOPHAGOGASTRODUODENOSCOPY (EGD) WITH PROPOFOL N/A 09/30/2018   Procedure: ESOPHAGOGASTRODUODENOSCOPY (EGD) WITH PROPOFOL;  Surgeon: Otis Brace, MD;  [facility];  Service: Gastroenterology;  Laterality: N/A;   ESOPHAGOGASTRODUODENOSCOPY (EGD) WITH PROPOFOL N/A 04/20/2019   Procedure: ESOPHAGOGASTRODUODENOSCOPY (EGD) WITH PROPOFOL;  Surgeon: Otis Brace, MD;  [facility];  Service: Gastroenterology;  Laterality: N/A;   ESOPHAGOGASTRODUODENOSCOPY (EGD) WITH PROPOFOL N/A 04/17/2020   Procedure: ESOPHAGOGASTRODUODENOSCOPY (EGD) WITH PROPOFOL;  Surgeon: Wonda Horner, MD;  Location: [place];  Service: Gastroenterology;  Laterality: N/A;   I & D EXTREMITY Left 02/18/2018   Procedure: IRRIGATION AND DEBRIDEMENT LEFT ARM;  Surgeon: Serafina Mitchell, MD;  [facility];  Service: Vascular;  Laterality: Left;   INSERTION OF DIALYSIS CATHETER N/A 01/19/2018   Procedure: INSERTION OF DIALYSIS CATHETER - RIGHT INTERNAL JUGULAR PLACEMENT;  Surgeon: Rosetta Posner, MD;  Location: [place];  Service: Vascular;  Laterality: N/A;   INSERTION OF DIALYSIS CATHETER Right 11/02/2020   Procedure: INSERTION OF TUNNELED DIALYSIS CATHETER;  Surgeon: Waynetta Sandy, MD;  Location: [facility] OR;  Service: Vascular;  Laterality: Right;   IR FLUORO GUIDE CV LINE RIGHT  11/10/2017   IR PARACENTESIS  12/10/2017   IR PARACENTESIS  12/26/2017   IR REMOVAL TUN CV CATH W/O FL  11/28/2017   IR THROMBECTOMY AV FISTULA W/THROMBOLYSIS/PTA INC/SHUNT/IMG LEFT Left 06/02/2019   IR US GUIDE VASC [place] LEFT  06/02/2019   IR US GUIDE VASC ACCESS RIGHT  11/10/2017   NECK SURGERY  2007   "back of neck; no hardware in there"   [place] Left 11/02/2020   Procedure: REVISON OF ARTERIOVENOUS FISTULA;  Surgeon: Waynetta Sandy, MD;  Location: [place];  Service: Vascular;  Laterality: Left;    Social History   Socioeconomic History   Marital status: Married    Spouse name: Not on file   Number of children: Not on file   Years of education: Not on file   Highest education level: Not on file  Occupational History   Not on file  Tobacco Use   Smoking status: Never   Smokeless tobacco: Never  Vaping Use   Vaping Use: Never used  Substance and Sexual Activity   Alcohol use: Never   Drug use: Never   Sexual activity: Not Currently  Other Topics Concern   Not on file  Social History Narrative   Not on file   Social Determinants of Health   Financial Resource Strain: Not on file  Food Insecurity: Not on file  Transportation Needs: Not on file  Physical Activity: Not on file   Stress: Not on file  Social Connections: Not on file  Intimate Partner Violence: Not on file    Family History  Problem Relation Age of Onset   Diabetes Mellitus II Sister    Diabetes Sister    Stroke Brother    Heart attack Brother     Current Outpatient Medications  Medication Sig Dispense Refill   acyclovir (ZOVIRAX) 200 MG capsule Take by mouth.     calcium acetate (PHOSLO) 667 MG tablet Take 667 mg by mouth 3 (three) times daily.     glucose blood (ONETOUCH VERIO) test strip USE 1 STRIP TO CHECK GLUCOSE THREE TIMES DAILY 150 each 3   insulin degludec (TRESIBA FLEXTOUCH) 100 UNIT/ML FlexTouch Pen Inject 14 Units into the skin daily. Inject 14 units under the skin once daily. 2 mL 0   Insulin Pen Needle (ADVOCATE INSULIN PEN NEEDLES) 31G X 5 MM MISC Use four times daily to inject insulin 150 each 1   insulin regular (NOVOLIN R,HUMULIN R) 100 units/mL injection Inject 6-8 Units into the skin See admin instructions. Inject 8 units subcutaneously with breakfast, and 6 units with lunch and supper  lactulose (CHRONULAC) 10 GM/15ML solution Take 45 mLs (30 g total) by mouth 3 (three) times daily. 946 mL 1   lidocaine-prilocaine (EMLA) cream Apply 1 application topically See admin instructions. Apply topically prior to dialysis on Tuesday, Thursday, Saturday     Multiple Vitamin (MULTIVITAMIN WITH MINERALS) TABS tablet Take 1 tablet by mouth daily.     mupirocin ointment (BACTROBAN) 2 % Apply 1 application topically daily. 30 g 2   ondansetron (ZOFRAN ODT) 4 MG disintegrating tablet Take 1 tablet (4 mg total) by mouth 2 (two) times daily as needed for nausea or vomiting. 20 tablet 0   ONE TOUCH LANCETS MISC Use to test blood sugar three times daily 100 each 2   oxyCODONE-acetaminophen (PERCOCET) 5-325 MG tablet Take 1 tablet by mouth every 6 (six) hours as needed for severe pain. 8 tablet 0   rifaximin (XIFAXAN) 550 MG TABS tablet Take 1 tablet (550 mg total) by mouth 2 (two) times  daily. 60 tablet 1   rosuvastatin (CRESTOR) 5 MG tablet Take 1 tablet (5 mg total) by mouth daily. 90 tablet 0   sevelamer carbonate (RENVELA) 800 MG tablet Take 800 mg by mouth 3 (three) times daily.     No current facility-administered medications for this visit.    Allergies  Allergen Reactions   Heparin Other (See Comments)    Patient is Muslim and is not permitted Pork derative due to religious belief)   Pork-Derived Products Other (See Comments)    Patient does not eat pork due to religious beliefs     REVIEW OF SYSTEMS:   [X]  denotes positive finding, [ ]  denotes negative finding Cardiac  Comments:  Chest pain or chest pressure:    Shortness of breath upon exertion:    Short of breath when lying flat:    Irregular heart rhythm:        Vascular    Pain in calf, thigh, or hip brought on by ambulation:    Pain in feet at night that wakes you up from your sleep:     Blood clot in your veins:    Leg swelling:         Pulmonary    Oxygen at home:    Productive cough:     Wheezing:         Neurologic    Sudden weakness in arms or legs:     Sudden numbness in arms or legs:     Sudden onset of difficulty speaking or slurred speech:    Temporary loss of vision in one eye:     Problems with dizziness:         Gastrointestinal    Blood in stool:     Vomited blood:         Genitourinary    Burning when urinating:     Blood in urine:        Psychiatric    Major depression:         Hematologic    Bleeding problems:    Problems with blood clotting too easily:        Skin    Rashes or ulcers:        Constitutional    Fever or chills:      PHYSICAL EXAMINATION:  Vitals:   07/11/21 0953  BP: (!) 147/69  Pulse: 82  Resp: 20  Temp: 98.1 F (36.7 C)  TempSrc: Temporal  SpO2: 97%  Weight: 137 lb (62.1 kg)  Height: 5' 5"  (1.651  m)    General:  WDWN in NAD; vital signs documented above Gait: Not observed HENT: WNL, normocephalic Pulmonary: normal  non-labored breathing Cardiac: regular HR Abdomen: soft, NT, no masses Skin: without rashes Vascular Exam/Pulses:  Right Left  Radial 2+ (normal) 2+ (normal)   Extremities: palpable thrill along basilic vein in R upper arm Musculoskeletal: no muscle wasting or atrophy  Neurologic: A&O X 3;  No focal weakness or paresthesias are detected Psychiatric:  The pt has Normal affect.   Non-Invasive Vascular Imaging:   Fistula duplex demonstrates a widely patent and maturing basilic vein fistula of the right upper extremity    ASSESSMENT/PLAN:: 70 y.o. male status post brachiobasilic vein creation  -Patent brachiobasilic fistula of the right upper extremity without signs or symptoms of steal syndrome -Fistula duplex today demonstrates a maturing fistula ready for second stage transposition -Plan will be for right arm second stage basilic vein transposition by Dr. Donzetta Matters in the near future on a nondialysis day.  The nature of the above procedure was discussed with the patient and his son including the risk of bleeding, infection, steal syndrome, nerve damage, thrombosis, failure to mature, and need for additional procedures.  The patient agrees to proceed.   [name], PA-C [facility] [phone]  Clinic MD:  Donzetta Matters

## 2021-07-12 DIAGNOSIS — R188 Other ascites: Secondary | ICD-10-CM | POA: Diagnosis not present

## 2021-07-12 DIAGNOSIS — K7460 Unspecified cirrhosis of liver: Secondary | ICD-10-CM | POA: Diagnosis not present

## 2021-07-12 DIAGNOSIS — M310 Hypersensitivity angiitis: Secondary | ICD-10-CM | POA: Diagnosis not present

## 2021-07-12 NOTE — Addendum Note (Signed)
Addended by: Nicholas Lose on: 07/12/2021 03:46 PM   Modules accepted: Orders

## 2021-07-18 DIAGNOSIS — Z7682 Awaiting organ transplant status: Secondary | ICD-10-CM | POA: Diagnosis not present

## 2021-07-18 DIAGNOSIS — E1122 Type 2 diabetes mellitus with diabetic chronic kidney disease: Secondary | ICD-10-CM | POA: Diagnosis not present

## 2021-07-18 DIAGNOSIS — N186 End stage renal disease: Secondary | ICD-10-CM | POA: Diagnosis not present

## 2021-07-18 DIAGNOSIS — Z992 Dependence on renal dialysis: Secondary | ICD-10-CM | POA: Diagnosis not present

## 2021-07-18 DIAGNOSIS — I272 Pulmonary hypertension, unspecified: Secondary | ICD-10-CM | POA: Diagnosis not present

## 2021-07-18 DIAGNOSIS — E119 Type 2 diabetes mellitus without complications: Secondary | ICD-10-CM | POA: Diagnosis not present

## 2021-07-18 DIAGNOSIS — I120 Hypertensive chronic kidney disease with stage 5 chronic kidney disease or end stage renal disease: Secondary | ICD-10-CM | POA: Diagnosis not present

## 2021-07-18 DIAGNOSIS — Z01818 Encounter for other preprocedural examination: Secondary | ICD-10-CM | POA: Diagnosis not present

## 2021-07-18 DIAGNOSIS — K760 Fatty (change of) liver, not elsewhere classified: Secondary | ICD-10-CM | POA: Diagnosis not present

## 2021-07-23 ENCOUNTER — Other Ambulatory Visit: Payer: Self-pay | Admitting: Endocrinology

## 2021-07-23 DIAGNOSIS — E1121 Type 2 diabetes mellitus with diabetic nephropathy: Secondary | ICD-10-CM

## 2021-07-25 ENCOUNTER — Telehealth: Payer: Self-pay | Admitting: Emergency Medicine

## 2021-07-25 ENCOUNTER — Encounter (HOSPITAL_COMMUNITY): Payer: Self-pay | Admitting: Vascular Surgery

## 2021-07-25 NOTE — Progress Notes (Signed)
LVM via voice interpreter William [name] # [phone] with arrival time on Fri 07/27/21 at [facility]. Nothing to eat or drink after midnight on 07/26/21. Medications listed on the shadow chart specifically Tresiba 7 units and Novolin R 4 units for a blood sugar above 220. Call back number left, as well.

## 2021-07-25 NOTE — Telephone Encounter (Signed)
Dani from [facility] has called and states that pt needs to see endocrine. States that the tansplant dr has advised for hemoglobin A1C.

## 2021-07-25 NOTE — Progress Notes (Signed)
Used Radio broadcast assistant for [employer] and instructions for [employer].    DUE TO COVID-19 ONLY ONE VISITOR IS ALLOWED TO COME WITH YOU AND STAY IN THE WAITING ROOM ONLY DURING PRE OP AND PROCEDURE DAY OF SURGERY.   PCP - Dr Agustina Caroli Cardiologist - n/a  Chest x-ray - 07/04/21 (1V) EKG - 07/04/21 Stress Test - 10/09/20 ECHO - 10/21/17 Cardiac Cath - 07/18/21 CE  ICD Pacemaker/Loop - n/a  Sleep Study -  n/a CPAP - none  THE NIGHT BEFORE SURGERY, do not take Novolin R Insulin unless your CBG is greater than 220 mg/dL,.  If CBG greater 220 mg/dl, you may take  of your sliding scale (correction) dose of insulin.      THE MORNING OF SURGERY, take 7 Units Insulin Degludec.  If your blood sugar is less than 70 mg/dL, you will need to treat for low blood sugar: Treat a low blood sugar (less than 70 mg/dL) with  cup of clear juice (cranberry or apple), 4 glucose tablets, OR glucose gel. Recheck blood sugar in 15 minutes after treatment (to make sure it is greater than 70 mg/dL). If your blood sugar is not greater than 70 mg/dL on recheck, call [phone] for further instructions  Anesthesia review: Yes  STOP now taking any Aspirin (unless otherwise instructed by your surgeon), Aleve, Naproxen, Ibuprofen, Motrin, Advil, Goody's, BC's, all herbal medications, fish oil, and all vitamins.   Coronavirus Screening Covid test n/a Ambulatory Surgery  Do you have any of the following symptoms:  Cough yes/no: No Fever (>100.54F)  yes/no: No Runny nose yes/no: No Sore throat yes/no: No Difficulty breathing/shortness of breath  yes/no: No  Have you traveled in the last 14 days and where? yes/no: No  Patient verbalized understanding of instructions that were given via phone via interpreter.

## 2021-07-25 NOTE — Progress Notes (Signed)
Used Arabic Interpreter Hansen ID [phone] for PAT information and instructions for DOS.  No answer, will call again later today.

## 2021-07-25 NOTE — Progress Notes (Signed)
Used Art gallery manager (ID [phone]) for PAT information and instructions for DOS.  No answer, LMOM that William [name] from [facility] called and would call back later today.

## 2021-07-26 NOTE — Anesthesia Preprocedure Evaluation (Addendum)
Anesthesia Evaluation  Patient identified by MRN, date of birth, ID band Patient awake    Reviewed: Allergy & Precautions, NPO status , Patient's Chart, lab work & pertinent test results  History of Anesthesia Complications Negative for: history of anesthetic complications  Airway Mallampati: II  TM Distance: >3 FB Neck ROM: Full    Dental  (+) Dental Advisory Given, Missing, Poor Dentition   Pulmonary neg pulmonary ROS,    Pulmonary exam normal        Cardiovascular hypertension, +CHF  Normal cardiovascular exam     Neuro/Psych negative neurological ROS  negative psych ROS   GI/Hepatic negative GI ROS, Neg liver ROS,   Endo/Other  diabetes, Type 2, Insulin Dependent  Renal/GU ESRF and DialysisRenal disease     Musculoskeletal negative musculoskeletal ROS (+)   Abdominal   Peds  Hematology negative hematology ROS (+)   Anesthesia Other Findings Goodpasture's disease   Reproductive/Obstetrics                            Anesthesia Physical Anesthesia Plan  ASA: 3  Anesthesia Plan: General   Post-op Pain Management:    Induction: Intravenous  PONV Risk Score and Plan: 2 and Propofol infusion, Treatment may vary due to age or medical condition and Ondansetron  Airway Management Planned: LMA  Additional Equipment: None  Intra-op Plan:   Post-operative Plan: Extubation in OR  Informed Consent: I have reviewed the patients History and Physical, chart, labs and discussed the procedure including the risks, benefits and alternatives for the proposed anesthesia with the patient or authorized representative who has indicated his/her understanding and acceptance.     Dental advisory given  Plan Discussed with: CRNA and Anesthesiologist  Anesthesia Plan Comments:        Anesthesia Quick Evaluation

## 2021-07-27 ENCOUNTER — Ambulatory Visit (HOSPITAL_COMMUNITY)
Admission: RE | Admit: 2021-07-27 | Discharge: 2021-07-27 | Disposition: A | Discharge status: Home / Self Care | Payer: Medicare Other | Attending: Vascular Surgery | Admitting: Vascular Surgery

## 2021-07-27 ENCOUNTER — Encounter (HOSPITAL_COMMUNITY)
Admission: RE | Disposition: A | Discharge status: Home / Self Care | Payer: Self-pay | Source: Home / Self Care | Attending: Vascular Surgery

## 2021-07-27 ENCOUNTER — Encounter (HOSPITAL_COMMUNITY): Payer: Self-pay | Admitting: Vascular Surgery

## 2021-07-27 ENCOUNTER — Ambulatory Visit (HOSPITAL_COMMUNITY): Payer: Medicare Other | Admitting: Physician Assistant

## 2021-07-27 ENCOUNTER — Other Ambulatory Visit: Payer: Self-pay

## 2021-07-27 DIAGNOSIS — E1122 Type 2 diabetes mellitus with diabetic chronic kidney disease: Secondary | ICD-10-CM | POA: Insufficient documentation

## 2021-07-27 DIAGNOSIS — Z794 Long term (current) use of insulin: Secondary | ICD-10-CM | POA: Diagnosis not present

## 2021-07-27 DIAGNOSIS — I5022 Chronic systolic (congestive) heart failure: Secondary | ICD-10-CM | POA: Diagnosis not present

## 2021-07-27 DIAGNOSIS — N186 End stage renal disease: Secondary | ICD-10-CM | POA: Insufficient documentation

## 2021-07-27 DIAGNOSIS — I509 Heart failure, unspecified: Secondary | ICD-10-CM | POA: Diagnosis not present

## 2021-07-27 DIAGNOSIS — I132 Hypertensive heart and chronic kidney disease with heart failure and with stage 5 chronic kidney disease, or end stage renal disease: Secondary | ICD-10-CM | POA: Insufficient documentation

## 2021-07-27 DIAGNOSIS — N184 Chronic kidney disease, stage 4 (severe): Secondary | ICD-10-CM

## 2021-07-27 DIAGNOSIS — D631 Anemia in chronic kidney disease: Secondary | ICD-10-CM | POA: Diagnosis not present

## 2021-07-27 DIAGNOSIS — Z992 Dependence on renal dialysis: Secondary | ICD-10-CM | POA: Diagnosis not present

## 2021-07-27 HISTORY — PX: BASCILIC VEIN TRANSPOSITION: SHX5742

## 2021-07-27 LAB — POCT I-STAT, CHEM 8
BUN: 30 mg/dL — ABNORMAL HIGH (ref 8–23)
Calcium, Ion: 1.08 mmol/L — ABNORMAL LOW (ref 1.15–1.40)
Chloride: 99 mmol/L (ref 98–111)
Creatinine, Ser: 5.6 mg/dL — ABNORMAL HIGH (ref 0.61–1.24)
Glucose, Bld: 89 mg/dL (ref 70–99)
HCT: 37 % — ABNORMAL LOW (ref 39.0–52.0)
Hemoglobin: 12.6 g/dL — ABNORMAL LOW (ref 13.0–17.0)
Potassium: 4.2 mmol/L (ref 3.5–5.1)
Sodium: 137 mmol/L (ref 135–145)
TCO2: 28 mmol/L (ref 22–32)

## 2021-07-27 LAB — GLUCOSE, CAPILLARY
Glucose-Capillary: 84 mg/dL (ref 70–99)
Glucose-Capillary: 152 mg/dL — ABNORMAL HIGH (ref 70–99)

## 2021-07-27 SURGERY — TRANSPOSITION, VEIN, BASILIC
Anesthesia: Regional | Laterality: Right

## 2021-07-27 MED ORDER — FENTANYL CITRATE (PF) 250 MCG/5ML IJ SOLN
INTRAMUSCULAR | Status: DC | PRN
  Administered 2021-07-27: 50 ug via INTRAVENOUS

## 2021-07-27 MED ORDER — ONDANSETRON HCL 4 MG/2ML IJ SOLN
INTRAMUSCULAR | Status: DC | PRN
  Administered 2021-07-27: 4 mg via INTRAVENOUS

## 2021-07-27 MED ORDER — LIDOCAINE 2% (20 MG/ML) 5 ML SYRINGE
INTRAMUSCULAR | Status: DC | PRN
  Administered 2021-07-27: 60 mg via INTRAVENOUS

## 2021-07-27 MED ORDER — CHLORHEXIDINE GLUCONATE 4 % EX LIQD: 60.0000 mL | Freq: Once | CUTANEOUS | Status: DC

## 2021-07-27 MED ORDER — PHENYLEPHRINE HCL-NACL 20-0.9 MG/250ML-% IV SOLN
INTRAVENOUS | Status: DC | PRN
  Administered 2021-07-27: 75 ug/min via INTRAVENOUS

## 2021-07-27 MED ORDER — ONDANSETRON HCL 4 MG/2ML IJ SOLN: 4.0000 mg | Freq: Once | INTRAMUSCULAR | Status: DC | PRN

## 2021-07-27 MED ORDER — CHLORHEXIDINE GLUCONATE 0.12 % MT SOLN
15.0000 mL | Freq: Once | OROMUCOSAL | Status: AC
  Administered 2021-07-27: 07:00:00 15 mL via OROMUCOSAL
  Filled 2021-07-27: qty 15

## 2021-07-27 MED ORDER — STERILE WATER FOR IRRIGATION IR SOLN
Status: DC | PRN
  Administered 2021-07-27: 1000 mL

## 2021-07-27 MED ORDER — SODIUM CHLORIDE 0.9 % IV SOLN: INTRAVENOUS | Status: DC

## 2021-07-27 MED ORDER — OXYCODONE HCL 5 MG PO TABS: 5.0000 mg | ORAL_TABLET | Freq: Once | ORAL | Status: DC | PRN

## 2021-07-27 MED ORDER — ORAL CARE MOUTH RINSE: 15.0000 mL | Freq: Once | OROMUCOSAL | Status: AC

## 2021-07-27 MED ORDER — SODIUM CHLORIDE 0.9 % IR SOLN
Status: DC | PRN
  Administered 2021-07-27: 250 mL

## 2021-07-27 MED ORDER — PROPOFOL 10 MG/ML IV BOLUS
INTRAVENOUS | Status: DC | PRN
  Administered 2021-07-27: 160 mg via INTRAVENOUS

## 2021-07-27 MED ORDER — PHENYLEPHRINE 40 MCG/ML (10ML) SYRINGE FOR IV PUSH (FOR BLOOD PRESSURE SUPPORT)
PREFILLED_SYRINGE | INTRAVENOUS | Status: DC | PRN
  Administered 2021-07-27: 120 ug via INTRAVENOUS

## 2021-07-27 MED ORDER — LIDOCAINE-EPINEPHRINE (PF) 1 %-1:200000 IJ SOLN
INTRAMUSCULAR | Status: AC
  Filled 2021-07-27: qty 30

## 2021-07-27 MED ORDER — FENTANYL CITRATE (PF) 250 MCG/5ML IJ SOLN
INTRAMUSCULAR | Status: AC
  Filled 2021-07-27: qty 5

## 2021-07-27 MED ORDER — PROPOFOL 500 MG/50ML IV EMUL
INTRAVENOUS | Status: DC | PRN
  Administered 2021-07-27: 25 ug/kg/min via INTRAVENOUS

## 2021-07-27 MED ORDER — PAPAVERINE HCL 30 MG/ML IJ SOLN
INTRAMUSCULAR | Status: AC
  Filled 2021-07-27: qty 2

## 2021-07-27 MED ORDER — FENTANYL CITRATE (PF) 100 MCG/2ML IJ SOLN: 25.0000 ug | INTRAMUSCULAR | Status: DC | PRN

## 2021-07-27 MED ORDER — OXYCODONE HCL 5 MG/5ML PO SOLN: 5.0000 mg | Freq: Once | ORAL | Status: DC | PRN

## 2021-07-27 MED ORDER — HEPARIN 6000 UNIT IRRIGATION SOLUTION
Status: AC
  Filled 2021-07-27: qty 500

## 2021-07-27 MED ORDER — EPHEDRINE SULFATE-NACL 50-0.9 MG/10ML-% IV SOSY
PREFILLED_SYRINGE | INTRAVENOUS | Status: DC | PRN
  Administered 2021-07-27: 15 mg via INTRAVENOUS

## 2021-07-27 MED ORDER — OXYCODONE-ACETAMINOPHEN 5-325 MG PO TABS: 1.0000 | ORAL_TABLET | Freq: Four times a day (QID) | ORAL | 0 refills | Status: DC | PRN

## 2021-07-27 MED ORDER — 0.9 % SODIUM CHLORIDE (POUR BTL) OPTIME
TOPICAL | Status: DC | PRN
  Administered 2021-07-27: 09:00:00 1000 mL

## 2021-07-27 MED ORDER — HEPARIN 6000 UNIT IRRIGATION SOLUTION: Status: DC | PRN

## 2021-07-27 MED ORDER — CEFAZOLIN SODIUM-DEXTROSE 2-4 GM/100ML-% IV SOLN
2.0000 g | INTRAVENOUS | Status: AC
  Administered 2021-07-27: 08:00:00 2 g via INTRAVENOUS
  Filled 2021-07-27: qty 100

## 2021-07-27 SURGICAL SUPPLY — 37 items
ADH SKN CLS APL DERMABOND .7 (GAUZE/BANDAGES/DRESSINGS) ×1
APL PRP STRL LF DISP 70% ISPRP (MISCELLANEOUS) ×1
ARMBAND PINK RESTRICT EXTREMIT (MISCELLANEOUS) ×4 IMPLANT
BAG COUNTER SPONGE SURGICOUNT (BAG) ×3 IMPLANT
BAG SPNG CNTER NS LX DISP (BAG) ×1
BAG SURGICOUNT SPONGE COUNTING (BAG) ×1
CANISTER SUCT 3000ML PPV (MISCELLANEOUS) ×4 IMPLANT
CHLORAPREP W/TINT 26 (MISCELLANEOUS) ×4 IMPLANT
CLIP LIGATING EXTRA MED SLVR (CLIP) ×4 IMPLANT
CLIP LIGATING EXTRA SM BLUE (MISCELLANEOUS) ×4 IMPLANT
COVER PROBE W GEL 5X96 (DRAPES) ×4 IMPLANT
DERMABOND ADVANCED (GAUZE/BANDAGES/DRESSINGS) ×2
DERMABOND ADVANCED .7 DNX12 (GAUZE/BANDAGES/DRESSINGS) ×2 IMPLANT
ELECT REM PT RETURN 9FT ADLT (ELECTROSURGICAL) ×3
ELECTRODE REM PT RTRN 9FT ADLT (ELECTROSURGICAL) ×2 IMPLANT
GLOVE SURG ENC MOIS LTX SZ8 (GLOVE) ×2 IMPLANT
GLOVE SURG UNDER POLY LF SZ6.5 (GLOVE) ×4 IMPLANT
GOWN STRL REUS W/ TWL LRG LVL3 (GOWN DISPOSABLE) ×4 IMPLANT
GOWN STRL REUS W/ TWL XL LVL3 (GOWN DISPOSABLE) ×2 IMPLANT
GOWN STRL REUS W/TWL LRG LVL3 (GOWN DISPOSABLE) ×6
GOWN STRL REUS W/TWL XL LVL3 (GOWN DISPOSABLE) ×3
KIT BASIN OR (CUSTOM PROCEDURE TRAY) ×4 IMPLANT
KIT TURNOVER KIT B (KITS) ×4 IMPLANT
NS IRRIG 1000ML POUR BTL (IV SOLUTION) ×4 IMPLANT
PACK CV ACCESS (CUSTOM PROCEDURE TRAY) ×4 IMPLANT
PAD ARMBOARD 7.5X6 YLW CONV (MISCELLANEOUS) ×8 IMPLANT
SLING ARM FOAM STRAP LRG (SOFTGOODS) IMPLANT
SUT MNCRL AB 4-0 PS2 18 (SUTURE) ×6 IMPLANT
SUT PROLENE 6 0 BV (SUTURE) ×8 IMPLANT
SUT SILK 2 0 SH (SUTURE) ×6 IMPLANT
SUT SILK 4 0 (SUTURE) ×3
SUT SILK 4-0 18XBRD TIE 12 (SUTURE) IMPLANT
SUT VIC AB 3-0 SH 27 (SUTURE) ×6
SUT VIC AB 3-0 SH 27X BRD (SUTURE) ×2 IMPLANT
TOWEL GREEN STERILE (TOWEL DISPOSABLE) ×4 IMPLANT
UNDERPAD 30X36 HEAVY ABSORB (UNDERPADS AND DIAPERS) ×4 IMPLANT
WATER STERILE IRR 1000ML POUR (IV SOLUTION) ×4 IMPLANT

## 2021-07-27 NOTE — Anesthesia Procedure Notes (Signed)
Procedure Name: LMA Insertion Date/Time: 07/27/2021 7:45 AM Performed by: Minerva Ends, CRNA Pre-anesthesia Checklist: Patient identified, Emergency Drugs available, Suction available and Patient being monitored Patient Re-evaluated:Patient Re-evaluated prior to induction Oxygen Delivery Method: Circle system utilized Preoxygenation: Pre-oxygenation with 100% oxygen Induction Type: IV induction LMA: LMA inserted LMA Size: 4.0 Tube type: Oral Number of attempts: 1 Placement Confirmation: positive ETCO2 and breath sounds checked- equal and bilateral Tube secured with: Tape Dental Injury: Teeth and Oropharynx as per pre-operative assessment

## 2021-07-27 NOTE — Transfer of Care (Signed)
Immediate Anesthesia Transfer of Care Note  Patient: William [name]  Procedure(s) Performed: SECOND STAGE RIGHT ARM BASILIC VEIN TRANSPOSITION (Right)  Patient Location: PACU  Anesthesia Type:General  Level of Consciousness: awake, alert  and oriented  Airway & Oxygen Therapy: Patient Spontanous Breathing  Post-op Assessment: Report given to RN and Post -op Vital signs reviewed and stable  Post vital signs: Reviewed and stable  Last Vitals:  Vitals Value Taken Time  BP 127/43 07/27/21 1015  Temp    Pulse 81 07/27/21 1017  Resp 13 07/27/21 1017  SpO2 100 % 07/27/21 1017  Vitals shown include unvalidated device data.  Last Pain:  Vitals:   07/27/21 0644  TempSrc:   PainSc: 0-No pain         Complications: No notable events documented.

## 2021-07-27 NOTE — Anesthesia Postprocedure Evaluation (Signed)
Anesthesia Post Note  Patient: William [name]  Procedure(s) Performed: SECOND STAGE RIGHT ARM BASILIC VEIN TRANSPOSITION (Right)     Patient location during evaluation: PACU Anesthesia Type: General Level of consciousness: awake and alert Pain management: pain level controlled Vital Signs Assessment: post-procedure vital signs reviewed and stable Respiratory status: spontaneous breathing, nonlabored ventilation and respiratory function stable Cardiovascular status: stable and blood pressure returned to baseline Anesthetic complications: no   No notable events documented.  Last Vitals:  Vitals:   07/27/21 1030 07/27/21 1045  BP: (!) 133/43 (!) 126/50  Pulse: 83 84  Resp: (!) 25 15  Temp:  36.7 C  SpO2: 99% 100%    Last Pain:  Vitals:   07/27/21 1045  TempSrc:   PainSc: 0-No pain                 Audry Pili

## 2021-07-27 NOTE — Discharge Instructions (Addendum)
° °  [facility]  Discharge Instructions  AV Fistula or Graft Surgery for Dialysis Access  Please refer to the following instructions for your post-procedure care. Your surgeon or physician assistant will discuss any changes with you.  Activity  You may drive the day following your surgery, if you are comfortable and no longer taking prescription pain medication. Resume full activity as the soreness in your incision resolves.  Bathing/Showering  You may shower after you go home. Keep your incision dry for 48 hours. Do not soak in a bathtub, hot tub, or swim until the incision heals completely. You may not shower if you have a hemodialysis catheter.  Incision Care  Clean your incision with mild soap and water after 48 hours. Pat the area dry with a clean towel. You do not need a bandage unless otherwise instructed. Do not apply any ointments or creams to your incision. You may have skin glue on your incision. Do not peel it off. It will come off on its own in about one week. Your arm may swell a bit after surgery. To reduce swelling use pillows to elevate your arm so it is above your heart. Your doctor will tell you if you need to lightly wrap your arm with an ACE bandage.  Diet  Resume your normal diet. There are not special food restrictions following this procedure. In order to heal from your surgery, it is CRITICAL to get adequate nutrition. Your body requires vitamins, minerals, and protein. Vegetables are the best source of vitamins and minerals. Vegetables also provide the perfect balance of protein. Processed food has little nutritional value, so try to avoid this.  Medications  Resume taking all of your medications. If your incision is causing pain, you may take over-the counter pain relievers such as acetaminophen (Tylenol). If you were prescribed a stronger pain medication, please be aware these medications can cause nausea and constipation. Prevent  nausea by taking the medication with a snack or meal. Avoid constipation by drinking plenty of fluids and eating foods with high amount of fiber, such as fruits, vegetables, and grains.  Do not take Tylenol if you are taking prescription pain medications.  Follow up Your surgeon may want to see you in the office following your access surgery. If so, this will be arranged at the time of your surgery.  Please call us immediately for any of the following conditions:  Increased pain, redness, drainage (pus) from your incision site Fever of 101 degrees or higher Severe or worsening pain at your incision site Hand pain or numbness.  Reduce your risk of vascular disease:  Stop smoking. If you would like help, call [facility] at 1-800-QUIT-NOW (805) 531-2092) or [place] at [place] your cholesterol Maintain a desired weight Control your diabetes Keep your blood pressure down  Dialysis  It will take several weeks to several months for your new dialysis access to be ready for use. Your surgeon will determine when it is okay to use it. Your nephrologist will continue to direct your dialysis. You can continue to use your Permcath until your new access is ready for use.   07/27/2021 William [name] [id number] 10-22-1950  Surgeon(s): Cherre Robins, MD  Procedure(s): SECOND STAGE RIGHT ARM BASILIC VEIN TRANSPOSITION  x Do not stick fistula for 8 weeks    If you have any questions, please call the office at [phone].

## 2021-07-27 NOTE — Op Note (Signed)
DATE OF SERVICE: 07/27/2021  PATIENT:  William [name]  70 y.o. male  PRE-OPERATIVE DIAGNOSIS:  ESRD  POST-OPERATIVE DIAGNOSIS:  Same  PROCEDURE:   Right arm second stage brachobasilic arteriovenous fistula transposition  SURGEON:  Surgeon(s) and Role:    * Cherre Robins, MD - Primary  ASSISTANT: Kateri Plummer, PA-C  An assistant was required to facilitate exposure and expedite the case.  ANESTHESIA:   general  EBL: 169m  BLOOD ADMINISTERED:none  DRAINS: none   LOCAL MEDICATIONS USED:  NONE  SPECIMEN:  none  COUNTS: confirmed correct.  TOURNIQUET:  none  PATIENT DISPOSITION:  PACU - hemodynamically stable.   Delay start of Pharmacological VTE agent (>24hrs) due to surgical blood loss or risk of bleeding: no  INDICATION FOR PROCEDURE: Esequiel MESTILL LLERENAis a 70y.o. male with ESRD in need of permanent dialysis access. After careful discussion of risks, benefits, and alternatives the patient was offered second stage basilic vein fistula creation. The patient  understood and wished to proceed.  OPERATIVE FINDINGS: Healthy basilic vein fistula. Unremarkable transposition.  DESCRIPTION OF PROCEDURE: After identification of the patient in the pre-operative holding area, the patient was transferred to the operating room. The patient was positioned supine on the operating room table. Anesthesia was induced. The right arm was prepped and draped in standard fashion. A surgical pause was performed confirming correct patient, procedure, and operative location.  Using intraoperative ultrasound the course of the right basilic vein was marked on the skin.  3 skip incisions were made over the course of the basilic vein fistula.  These were carried down through subcutaneous tissue until the fistula was encountered.  The fascia was skeletonized from the axilla to the anastomosis, taking care to ligate and divide sidebranches, and to protect the medial antebrachial cutaneous nerve.   A  subcutaneous tunnel was created over the biceps using a sheath tunneling device.  Patient was heparinized.  The fistula near the anastomosis was clamped.  The outflow in the axilla was clamped.  The fistula was divided.  The fistula was marked to ensure no twisting or kinking while delivering the fistula through the tunnel.  The fistula was tunneled through the arm and delivered near the previous anastomosis.  The fistula was spatulated proximally and distally.  The fistula was reanastomosed end-to-end using continuous running suture of 5-0 Prolene.  A palpable thrill was felt over the subcutaneous course of the tunnel.  Doppler flow was excellent in the proximal and distal fistula.  The wounds were copiously irrigated.  Hemostasis was ensured in the surgical bed.  The wounds were closed in layers using 3-0 Vicryl and 4-0 Monocryl.  Upon completion of the case instrument and sharps counts were confirmed correct. The patient was transferred to the PACU in good condition. I was present for all portions of the procedure.  [name] MD [facility]Phone Number: [phone]/23/2022 9:51 AM

## 2021-07-27 NOTE — Interval H&P Note (Signed)
History and Physical Interval Note:  07/27/2021 7:15 AM  William [name]  has presented today for surgery, with the diagnosis of ESRD.  The various methods of treatment have been discussed with the patient and family. After consideration of risks, benefits and other options for treatment, the patient has consented to  Procedure(s): SECOND STAGE RIGHT ARM BASILIC VEIN TRANSPOSITION (Right) as a surgical intervention.  The patient's history has been reviewed, patient examined, no change in status, stable for surgery.  I have reviewed the patient's chart and labs.  Questions were answered to the patient's satisfaction.     Cherre Robins

## 2021-07-28 ENCOUNTER — Encounter (HOSPITAL_COMMUNITY): Payer: Self-pay | Admitting: Vascular Surgery

## 2021-08-07 DIAGNOSIS — N2581 Secondary hyperparathyroidism of renal origin: Secondary | ICD-10-CM | POA: Diagnosis not present

## 2021-08-07 DIAGNOSIS — Z992 Dependence on renal dialysis: Secondary | ICD-10-CM | POA: Diagnosis not present

## 2021-08-07 DIAGNOSIS — N186 End stage renal disease: Secondary | ICD-10-CM | POA: Diagnosis not present

## 2021-08-07 DIAGNOSIS — D631 Anemia in chronic kidney disease: Secondary | ICD-10-CM | POA: Diagnosis not present

## 2021-08-07 DIAGNOSIS — D509 Iron deficiency anemia, unspecified: Secondary | ICD-10-CM | POA: Diagnosis not present

## 2021-08-09 DIAGNOSIS — Z79899 Other long term (current) drug therapy: Secondary | ICD-10-CM | POA: Diagnosis not present

## 2021-08-09 DIAGNOSIS — N186 End stage renal disease: Secondary | ICD-10-CM | POA: Diagnosis present

## 2021-08-09 DIAGNOSIS — F05 Delirium due to known physiological condition: Secondary | ICD-10-CM | POA: Diagnosis not present

## 2021-08-09 DIAGNOSIS — K91870 Postprocedural hematoma of a digestive system organ or structure following a digestive system procedure: Secondary | ICD-10-CM | POA: Diagnosis not present

## 2021-08-09 DIAGNOSIS — E0965 Drug or chemical induced diabetes mellitus with hyperglycemia: Secondary | ICD-10-CM | POA: Diagnosis not present

## 2021-08-09 DIAGNOSIS — Y33XXXA Other specified events, undetermined intent, initial encounter: Secondary | ICD-10-CM | POA: Diagnosis not present

## 2021-08-09 DIAGNOSIS — G8918 Other acute postprocedural pain: Secondary | ICD-10-CM | POA: Diagnosis not present

## 2021-08-09 DIAGNOSIS — T380X5A Adverse effect of glucocorticoids and synthetic analogues, initial encounter: Secondary | ICD-10-CM | POA: Diagnosis not present

## 2021-08-09 DIAGNOSIS — E1165 Type 2 diabetes mellitus with hyperglycemia: Secondary | ICD-10-CM | POA: Diagnosis not present

## 2021-08-09 DIAGNOSIS — R34 Anuria and oliguria: Secondary | ICD-10-CM | POA: Diagnosis not present

## 2021-08-09 DIAGNOSIS — S62612A Displaced fracture of proximal phalanx of right middle finger, initial encounter for closed fracture: Secondary | ICD-10-CM | POA: Diagnosis not present

## 2021-08-09 DIAGNOSIS — I5042 Chronic combined systolic (congestive) and diastolic (congestive) heart failure: Secondary | ICD-10-CM | POA: Diagnosis present

## 2021-08-09 DIAGNOSIS — I82C12 Acute embolism and thrombosis of left internal jugular vein: Secondary | ICD-10-CM | POA: Diagnosis not present

## 2021-08-09 DIAGNOSIS — Z20822 Contact with and (suspected) exposure to covid-19: Secondary | ICD-10-CM | POA: Diagnosis present

## 2021-08-09 DIAGNOSIS — I8500 Esophageal varices without bleeding: Secondary | ICD-10-CM | POA: Diagnosis present

## 2021-08-09 DIAGNOSIS — E11649 Type 2 diabetes mellitus with hypoglycemia without coma: Secondary | ICD-10-CM | POA: Diagnosis not present

## 2021-08-09 DIAGNOSIS — S62624D Displaced fracture of medial phalanx of right ring finger, subsequent encounter for fracture with routine healing: Secondary | ICD-10-CM | POA: Diagnosis not present

## 2021-08-09 DIAGNOSIS — I10 Essential (primary) hypertension: Secondary | ICD-10-CM | POA: Diagnosis not present

## 2021-08-09 DIAGNOSIS — M7989 Other specified soft tissue disorders: Secondary | ICD-10-CM | POA: Diagnosis not present

## 2021-08-09 DIAGNOSIS — Z960 Presence of urogenital implants: Secondary | ICD-10-CM | POA: Diagnosis not present

## 2021-08-09 DIAGNOSIS — D84821 Immunodeficiency due to drugs: Secondary | ICD-10-CM | POA: Diagnosis not present

## 2021-08-09 DIAGNOSIS — K3189 Other diseases of stomach and duodenum: Secondary | ICD-10-CM | POA: Diagnosis not present

## 2021-08-09 DIAGNOSIS — I509 Heart failure, unspecified: Secondary | ICD-10-CM | POA: Diagnosis not present

## 2021-08-09 DIAGNOSIS — K7460 Unspecified cirrhosis of liver: Secondary | ICD-10-CM | POA: Diagnosis present

## 2021-08-09 DIAGNOSIS — Z7682 Awaiting organ transplant status: Secondary | ICD-10-CM | POA: Diagnosis not present

## 2021-08-09 DIAGNOSIS — S63234A Subluxation of proximal interphalangeal joint of right ring finger, initial encounter: Secondary | ICD-10-CM | POA: Diagnosis not present

## 2021-08-09 DIAGNOSIS — N400 Enlarged prostate without lower urinary tract symptoms: Secondary | ICD-10-CM | POA: Diagnosis not present

## 2021-08-09 DIAGNOSIS — D62 Acute posthemorrhagic anemia: Secondary | ICD-10-CM | POA: Diagnosis not present

## 2021-08-09 DIAGNOSIS — E1122 Type 2 diabetes mellitus with diabetic chronic kidney disease: Secondary | ICD-10-CM | POA: Diagnosis present

## 2021-08-09 DIAGNOSIS — D509 Iron deficiency anemia, unspecified: Secondary | ICD-10-CM | POA: Diagnosis not present

## 2021-08-09 DIAGNOSIS — D849 Immunodeficiency, unspecified: Secondary | ICD-10-CM | POA: Diagnosis not present

## 2021-08-09 DIAGNOSIS — T8649 Other complications of liver transplant: Secondary | ICD-10-CM | POA: Diagnosis not present

## 2021-08-09 DIAGNOSIS — R4182 Altered mental status, unspecified: Secondary | ICD-10-CM | POA: Diagnosis not present

## 2021-08-09 DIAGNOSIS — R109 Unspecified abdominal pain: Secondary | ICD-10-CM | POA: Diagnosis not present

## 2021-08-09 DIAGNOSIS — G4700 Insomnia, unspecified: Secondary | ICD-10-CM | POA: Diagnosis not present

## 2021-08-09 DIAGNOSIS — R0689 Other abnormalities of breathing: Secondary | ICD-10-CM | POA: Diagnosis not present

## 2021-08-09 DIAGNOSIS — E1121 Type 2 diabetes mellitus with diabetic nephropathy: Secondary | ICD-10-CM | POA: Diagnosis not present

## 2021-08-09 DIAGNOSIS — I120 Hypertensive chronic kidney disease with stage 5 chronic kidney disease or end stage renal disease: Secondary | ICD-10-CM | POA: Diagnosis not present

## 2021-08-09 DIAGNOSIS — Z792 Long term (current) use of antibiotics: Secondary | ICD-10-CM | POA: Diagnosis not present

## 2021-08-09 DIAGNOSIS — N2581 Secondary hyperparathyroidism of renal origin: Secondary | ICD-10-CM | POA: Diagnosis not present

## 2021-08-09 DIAGNOSIS — T8619 Other complication of kidney transplant: Secondary | ICD-10-CM | POA: Diagnosis not present

## 2021-08-09 DIAGNOSIS — I132 Hypertensive heart and chronic kidney disease with heart failure and with stage 5 chronic kidney disease, or end stage renal disease: Secondary | ICD-10-CM | POA: Diagnosis present

## 2021-08-09 DIAGNOSIS — R188 Other ascites: Secondary | ICD-10-CM | POA: Diagnosis present

## 2021-08-09 DIAGNOSIS — I130 Hypertensive heart and chronic kidney disease with heart failure and stage 1 through stage 4 chronic kidney disease, or unspecified chronic kidney disease: Secondary | ICD-10-CM | POA: Diagnosis not present

## 2021-08-09 DIAGNOSIS — J811 Chronic pulmonary edema: Secondary | ICD-10-CM | POA: Diagnosis not present

## 2021-08-09 DIAGNOSIS — Z940 Kidney transplant status: Secondary | ICD-10-CM | POA: Diagnosis not present

## 2021-08-09 DIAGNOSIS — D638 Anemia in other chronic diseases classified elsewhere: Secondary | ICD-10-CM | POA: Diagnosis present

## 2021-08-09 DIAGNOSIS — I5030 Unspecified diastolic (congestive) heart failure: Secondary | ICD-10-CM | POA: Diagnosis not present

## 2021-08-09 DIAGNOSIS — M310 Hypersensitivity angiitis: Secondary | ICD-10-CM | POA: Diagnosis present

## 2021-08-09 DIAGNOSIS — Z95828 Presence of other vascular implants and grafts: Secondary | ICD-10-CM | POA: Diagnosis not present

## 2021-08-09 DIAGNOSIS — I82C11 Acute embolism and thrombosis of right internal jugular vein: Secondary | ICD-10-CM | POA: Diagnosis not present

## 2021-08-09 DIAGNOSIS — K766 Portal hypertension: Secondary | ICD-10-CM | POA: Diagnosis not present

## 2021-08-09 DIAGNOSIS — K319 Disease of stomach and duodenum, unspecified: Secondary | ICD-10-CM | POA: Diagnosis not present

## 2021-08-09 DIAGNOSIS — Z992 Dependence on renal dialysis: Secondary | ICD-10-CM | POA: Diagnosis not present

## 2021-08-09 DIAGNOSIS — K922 Gastrointestinal hemorrhage, unspecified: Secondary | ICD-10-CM | POA: Diagnosis not present

## 2021-08-09 DIAGNOSIS — R918 Other nonspecific abnormal finding of lung field: Secondary | ICD-10-CM | POA: Diagnosis not present

## 2021-08-09 DIAGNOSIS — N189 Chronic kidney disease, unspecified: Secondary | ICD-10-CM | POA: Diagnosis not present

## 2021-08-09 DIAGNOSIS — Z452 Encounter for adjustment and management of vascular access device: Secondary | ICD-10-CM | POA: Diagnosis not present

## 2021-08-09 DIAGNOSIS — R5381 Other malaise: Secondary | ICD-10-CM | POA: Diagnosis not present

## 2021-08-09 DIAGNOSIS — R410 Disorientation, unspecified: Secondary | ICD-10-CM | POA: Diagnosis not present

## 2021-08-09 DIAGNOSIS — I728 Aneurysm of other specified arteries: Secondary | ICD-10-CM | POA: Diagnosis not present

## 2021-08-09 DIAGNOSIS — S62624A Displaced fracture of medial phalanx of right ring finger, initial encounter for closed fracture: Secondary | ICD-10-CM | POA: Diagnosis not present

## 2021-08-09 DIAGNOSIS — K7210 Chronic hepatic failure without coma: Secondary | ICD-10-CM | POA: Diagnosis present

## 2021-08-09 DIAGNOSIS — I517 Cardiomegaly: Secondary | ICD-10-CM | POA: Diagnosis not present

## 2021-08-09 DIAGNOSIS — K921 Melena: Secondary | ICD-10-CM | POA: Diagnosis not present

## 2021-08-09 DIAGNOSIS — G9340 Encephalopathy, unspecified: Secondary | ICD-10-CM | POA: Diagnosis not present

## 2021-08-09 DIAGNOSIS — R739 Hyperglycemia, unspecified: Secondary | ICD-10-CM | POA: Diagnosis not present

## 2021-08-09 DIAGNOSIS — Y33XXXD Other specified events, undetermined intent, subsequent encounter: Secondary | ICD-10-CM | POA: Diagnosis not present

## 2021-08-09 DIAGNOSIS — D631 Anemia in chronic kidney disease: Secondary | ICD-10-CM | POA: Diagnosis not present

## 2021-08-09 DIAGNOSIS — K219 Gastro-esophageal reflux disease without esophagitis: Secondary | ICD-10-CM | POA: Diagnosis not present

## 2021-08-09 DIAGNOSIS — Z86718 Personal history of other venous thrombosis and embolism: Secondary | ICD-10-CM | POA: Diagnosis not present

## 2021-08-09 DIAGNOSIS — Z944 Liver transplant status: Secondary | ICD-10-CM | POA: Diagnosis not present

## 2021-08-09 DIAGNOSIS — D696 Thrombocytopenia, unspecified: Secondary | ICD-10-CM | POA: Diagnosis present

## 2021-08-09 DIAGNOSIS — R140 Abdominal distension (gaseous): Secondary | ICD-10-CM | POA: Diagnosis not present

## 2021-08-09 DIAGNOSIS — E875 Hyperkalemia: Secondary | ICD-10-CM | POA: Diagnosis not present

## 2021-08-09 DIAGNOSIS — Z8616 Personal history of COVID-19: Secondary | ICD-10-CM | POA: Diagnosis not present

## 2021-08-09 DIAGNOSIS — F23 Brief psychotic disorder: Secondary | ICD-10-CM | POA: Diagnosis not present

## 2021-08-09 DIAGNOSIS — D649 Anemia, unspecified: Secondary | ICD-10-CM | POA: Diagnosis not present

## 2021-08-09 DIAGNOSIS — K635 Polyp of colon: Secondary | ICD-10-CM | POA: Diagnosis not present

## 2021-08-09 DIAGNOSIS — K7581 Nonalcoholic steatohepatitis (NASH): Secondary | ICD-10-CM | POA: Diagnosis present

## 2021-08-10 DIAGNOSIS — K3189 Other diseases of stomach and duodenum: Secondary | ICD-10-CM | POA: Diagnosis not present

## 2021-08-10 DIAGNOSIS — I10 Essential (primary) hypertension: Secondary | ICD-10-CM | POA: Diagnosis not present

## 2021-08-10 DIAGNOSIS — E875 Hyperkalemia: Secondary | ICD-10-CM | POA: Diagnosis not present

## 2021-08-10 DIAGNOSIS — I130 Hypertensive heart and chronic kidney disease with heart failure and stage 1 through stage 4 chronic kidney disease, or unspecified chronic kidney disease: Secondary | ICD-10-CM | POA: Diagnosis not present

## 2021-08-10 DIAGNOSIS — E1165 Type 2 diabetes mellitus with hyperglycemia: Secondary | ICD-10-CM | POA: Diagnosis not present

## 2021-08-10 DIAGNOSIS — Z8616 Personal history of COVID-19: Secondary | ICD-10-CM | POA: Diagnosis not present

## 2021-08-10 DIAGNOSIS — I82C12 Acute embolism and thrombosis of left internal jugular vein: Secondary | ICD-10-CM | POA: Diagnosis not present

## 2021-08-10 DIAGNOSIS — Z452 Encounter for adjustment and management of vascular access device: Secondary | ICD-10-CM | POA: Diagnosis not present

## 2021-08-10 DIAGNOSIS — F05 Delirium due to known physiological condition: Secondary | ICD-10-CM | POA: Diagnosis not present

## 2021-08-10 DIAGNOSIS — R918 Other nonspecific abnormal finding of lung field: Secondary | ICD-10-CM | POA: Diagnosis not present

## 2021-08-10 DIAGNOSIS — K635 Polyp of colon: Secondary | ICD-10-CM | POA: Diagnosis not present

## 2021-08-10 DIAGNOSIS — I509 Heart failure, unspecified: Secondary | ICD-10-CM | POA: Diagnosis not present

## 2021-08-10 DIAGNOSIS — K766 Portal hypertension: Secondary | ICD-10-CM | POA: Diagnosis not present

## 2021-08-10 DIAGNOSIS — R140 Abdominal distension (gaseous): Secondary | ICD-10-CM | POA: Diagnosis not present

## 2021-08-10 DIAGNOSIS — N186 End stage renal disease: Secondary | ICD-10-CM | POA: Diagnosis present

## 2021-08-10 DIAGNOSIS — G9340 Encephalopathy, unspecified: Secondary | ICD-10-CM | POA: Diagnosis not present

## 2021-08-10 DIAGNOSIS — G4700 Insomnia, unspecified: Secondary | ICD-10-CM | POA: Diagnosis not present

## 2021-08-10 DIAGNOSIS — R739 Hyperglycemia, unspecified: Secondary | ICD-10-CM | POA: Diagnosis not present

## 2021-08-10 DIAGNOSIS — R34 Anuria and oliguria: Secondary | ICD-10-CM | POA: Diagnosis not present

## 2021-08-10 DIAGNOSIS — F23 Brief psychotic disorder: Secondary | ICD-10-CM | POA: Diagnosis not present

## 2021-08-10 DIAGNOSIS — N400 Enlarged prostate without lower urinary tract symptoms: Secondary | ICD-10-CM | POA: Diagnosis not present

## 2021-08-10 DIAGNOSIS — R5381 Other malaise: Secondary | ICD-10-CM | POA: Diagnosis not present

## 2021-08-10 DIAGNOSIS — S62624D Displaced fracture of medial phalanx of right ring finger, subsequent encounter for fracture with routine healing: Secondary | ICD-10-CM | POA: Diagnosis not present

## 2021-08-10 DIAGNOSIS — T8619 Other complication of kidney transplant: Secondary | ICD-10-CM | POA: Diagnosis not present

## 2021-08-10 DIAGNOSIS — K921 Melena: Secondary | ICD-10-CM | POA: Diagnosis not present

## 2021-08-10 DIAGNOSIS — R4182 Altered mental status, unspecified: Secondary | ICD-10-CM | POA: Diagnosis not present

## 2021-08-10 DIAGNOSIS — I5030 Unspecified diastolic (congestive) heart failure: Secondary | ICD-10-CM | POA: Diagnosis not present

## 2021-08-10 DIAGNOSIS — I5042 Chronic combined systolic (congestive) and diastolic (congestive) heart failure: Secondary | ICD-10-CM | POA: Diagnosis present

## 2021-08-10 DIAGNOSIS — D638 Anemia in other chronic diseases classified elsewhere: Secondary | ICD-10-CM | POA: Diagnosis present

## 2021-08-10 DIAGNOSIS — Z944 Liver transplant status: Secondary | ICD-10-CM | POA: Diagnosis not present

## 2021-08-10 DIAGNOSIS — M7989 Other specified soft tissue disorders: Secondary | ICD-10-CM | POA: Diagnosis not present

## 2021-08-10 DIAGNOSIS — Z79899 Other long term (current) drug therapy: Secondary | ICD-10-CM | POA: Diagnosis not present

## 2021-08-10 DIAGNOSIS — M310 Hypersensitivity angiitis: Secondary | ICD-10-CM | POA: Diagnosis present

## 2021-08-10 DIAGNOSIS — G8918 Other acute postprocedural pain: Secondary | ICD-10-CM | POA: Diagnosis not present

## 2021-08-10 DIAGNOSIS — D696 Thrombocytopenia, unspecified: Secondary | ICD-10-CM | POA: Diagnosis present

## 2021-08-10 DIAGNOSIS — Z940 Kidney transplant status: Secondary | ICD-10-CM | POA: Diagnosis not present

## 2021-08-10 DIAGNOSIS — I132 Hypertensive heart and chronic kidney disease with heart failure and with stage 5 chronic kidney disease, or end stage renal disease: Secondary | ICD-10-CM | POA: Diagnosis present

## 2021-08-10 DIAGNOSIS — I728 Aneurysm of other specified arteries: Secondary | ICD-10-CM | POA: Diagnosis not present

## 2021-08-10 DIAGNOSIS — Z20822 Contact with and (suspected) exposure to covid-19: Secondary | ICD-10-CM | POA: Diagnosis present

## 2021-08-10 DIAGNOSIS — J811 Chronic pulmonary edema: Secondary | ICD-10-CM | POA: Diagnosis not present

## 2021-08-10 DIAGNOSIS — I8500 Esophageal varices without bleeding: Secondary | ICD-10-CM | POA: Diagnosis present

## 2021-08-10 DIAGNOSIS — K219 Gastro-esophageal reflux disease without esophagitis: Secondary | ICD-10-CM | POA: Diagnosis not present

## 2021-08-10 DIAGNOSIS — K7581 Nonalcoholic steatohepatitis (NASH): Secondary | ICD-10-CM | POA: Diagnosis not present

## 2021-08-10 DIAGNOSIS — Z7682 Awaiting organ transplant status: Secondary | ICD-10-CM | POA: Diagnosis not present

## 2021-08-10 DIAGNOSIS — R188 Other ascites: Secondary | ICD-10-CM | POA: Diagnosis present

## 2021-08-10 DIAGNOSIS — K91870 Postprocedural hematoma of a digestive system organ or structure following a digestive system procedure: Secondary | ICD-10-CM | POA: Diagnosis not present

## 2021-08-10 DIAGNOSIS — I517 Cardiomegaly: Secondary | ICD-10-CM | POA: Diagnosis not present

## 2021-08-10 DIAGNOSIS — E11649 Type 2 diabetes mellitus with hypoglycemia without coma: Secondary | ICD-10-CM | POA: Diagnosis not present

## 2021-08-10 DIAGNOSIS — Z960 Presence of urogenital implants: Secondary | ICD-10-CM | POA: Diagnosis not present

## 2021-08-10 DIAGNOSIS — K7460 Unspecified cirrhosis of liver: Secondary | ICD-10-CM | POA: Diagnosis present

## 2021-08-10 DIAGNOSIS — T8649 Other complications of liver transplant: Secondary | ICD-10-CM | POA: Diagnosis not present

## 2021-08-10 DIAGNOSIS — Z95828 Presence of other vascular implants and grafts: Secondary | ICD-10-CM | POA: Diagnosis not present

## 2021-08-10 DIAGNOSIS — K7210 Chronic hepatic failure without coma: Secondary | ICD-10-CM | POA: Diagnosis present

## 2021-08-10 DIAGNOSIS — R109 Unspecified abdominal pain: Secondary | ICD-10-CM | POA: Diagnosis not present

## 2021-08-10 DIAGNOSIS — Z792 Long term (current) use of antibiotics: Secondary | ICD-10-CM | POA: Diagnosis not present

## 2021-08-10 DIAGNOSIS — S62624A Displaced fracture of medial phalanx of right ring finger, initial encounter for closed fracture: Secondary | ICD-10-CM | POA: Diagnosis not present

## 2021-08-10 DIAGNOSIS — E1122 Type 2 diabetes mellitus with diabetic chronic kidney disease: Secondary | ICD-10-CM | POA: Diagnosis present

## 2021-08-10 DIAGNOSIS — K922 Gastrointestinal hemorrhage, unspecified: Secondary | ICD-10-CM | POA: Diagnosis not present

## 2021-08-10 DIAGNOSIS — R410 Disorientation, unspecified: Secondary | ICD-10-CM | POA: Diagnosis not present

## 2021-08-10 DIAGNOSIS — D84821 Immunodeficiency due to drugs: Secondary | ICD-10-CM | POA: Diagnosis not present

## 2021-08-10 DIAGNOSIS — I120 Hypertensive chronic kidney disease with stage 5 chronic kidney disease or end stage renal disease: Secondary | ICD-10-CM | POA: Diagnosis not present

## 2021-08-10 DIAGNOSIS — D62 Acute posthemorrhagic anemia: Secondary | ICD-10-CM | POA: Diagnosis not present

## 2021-08-10 DIAGNOSIS — K319 Disease of stomach and duodenum, unspecified: Secondary | ICD-10-CM | POA: Diagnosis not present

## 2021-08-10 DIAGNOSIS — E0965 Drug or chemical induced diabetes mellitus with hyperglycemia: Secondary | ICD-10-CM | POA: Diagnosis not present

## 2021-08-10 DIAGNOSIS — I82C11 Acute embolism and thrombosis of right internal jugular vein: Secondary | ICD-10-CM | POA: Diagnosis not present

## 2021-08-10 DIAGNOSIS — T380X5A Adverse effect of glucocorticoids and synthetic analogues, initial encounter: Secondary | ICD-10-CM | POA: Diagnosis not present

## 2021-08-10 DIAGNOSIS — Z992 Dependence on renal dialysis: Secondary | ICD-10-CM | POA: Diagnosis not present

## 2021-08-10 DIAGNOSIS — D649 Anemia, unspecified: Secondary | ICD-10-CM | POA: Diagnosis not present

## 2021-08-10 DIAGNOSIS — R0689 Other abnormalities of breathing: Secondary | ICD-10-CM | POA: Diagnosis not present

## 2021-08-10 DIAGNOSIS — N189 Chronic kidney disease, unspecified: Secondary | ICD-10-CM | POA: Diagnosis not present

## 2021-08-10 DIAGNOSIS — E1121 Type 2 diabetes mellitus with diabetic nephropathy: Secondary | ICD-10-CM | POA: Diagnosis not present

## 2021-08-10 DIAGNOSIS — D849 Immunodeficiency, unspecified: Secondary | ICD-10-CM | POA: Diagnosis not present

## 2021-08-10 DIAGNOSIS — S62612A Displaced fracture of proximal phalanx of right middle finger, initial encounter for closed fracture: Secondary | ICD-10-CM | POA: Diagnosis not present

## 2021-08-10 DIAGNOSIS — Y33XXXD Other specified events, undetermined intent, subsequent encounter: Secondary | ICD-10-CM | POA: Diagnosis not present

## 2021-08-10 DIAGNOSIS — Z86718 Personal history of other venous thrombosis and embolism: Secondary | ICD-10-CM | POA: Diagnosis not present

## 2021-08-17 ENCOUNTER — Other Ambulatory Visit: Payer: Self-pay

## 2021-08-17 DIAGNOSIS — N186 End stage renal disease: Secondary | ICD-10-CM

## 2021-08-28 ENCOUNTER — Encounter (HOSPITAL_COMMUNITY): Payer: Medicare Other

## 2021-09-10 DIAGNOSIS — D849 Immunodeficiency, unspecified: Secondary | ICD-10-CM | POA: Diagnosis not present

## 2021-09-10 DIAGNOSIS — Z944 Liver transplant status: Secondary | ICD-10-CM | POA: Diagnosis not present

## 2021-09-10 DIAGNOSIS — Z940 Kidney transplant status: Secondary | ICD-10-CM | POA: Diagnosis not present

## 2021-09-11 DIAGNOSIS — Z4822 Encounter for aftercare following kidney transplant: Secondary | ICD-10-CM | POA: Diagnosis not present

## 2021-09-11 DIAGNOSIS — Z86718 Personal history of other venous thrombosis and embolism: Secondary | ICD-10-CM | POA: Diagnosis not present

## 2021-09-11 DIAGNOSIS — I132 Hypertensive heart and chronic kidney disease with heart failure and with stage 5 chronic kidney disease, or end stage renal disease: Secondary | ICD-10-CM | POA: Diagnosis not present

## 2021-09-11 DIAGNOSIS — D631 Anemia in chronic kidney disease: Secondary | ICD-10-CM | POA: Diagnosis not present

## 2021-09-11 DIAGNOSIS — D696 Thrombocytopenia, unspecified: Secondary | ICD-10-CM | POA: Diagnosis not present

## 2021-09-11 DIAGNOSIS — Z992 Dependence on renal dialysis: Secondary | ICD-10-CM | POA: Diagnosis not present

## 2021-09-11 DIAGNOSIS — G4700 Insomnia, unspecified: Secondary | ICD-10-CM | POA: Diagnosis not present

## 2021-09-11 DIAGNOSIS — Z944 Liver transplant status: Secondary | ICD-10-CM | POA: Diagnosis not present

## 2021-09-11 DIAGNOSIS — Z4823 Encounter for aftercare following liver transplant: Secondary | ICD-10-CM | POA: Diagnosis not present

## 2021-09-11 DIAGNOSIS — E1121 Type 2 diabetes mellitus with diabetic nephropathy: Secondary | ICD-10-CM | POA: Diagnosis not present

## 2021-09-11 DIAGNOSIS — N186 End stage renal disease: Secondary | ICD-10-CM | POA: Diagnosis not present

## 2021-09-11 DIAGNOSIS — Z792 Long term (current) use of antibiotics: Secondary | ICD-10-CM | POA: Diagnosis not present

## 2021-09-11 DIAGNOSIS — Z794 Long term (current) use of insulin: Secondary | ICD-10-CM | POA: Diagnosis not present

## 2021-09-11 DIAGNOSIS — K7581 Nonalcoholic steatohepatitis (NASH): Secondary | ICD-10-CM | POA: Diagnosis not present

## 2021-09-11 DIAGNOSIS — K219 Gastro-esophageal reflux disease without esophagitis: Secondary | ICD-10-CM | POA: Diagnosis not present

## 2021-09-11 DIAGNOSIS — Z940 Kidney transplant status: Secondary | ICD-10-CM | POA: Diagnosis not present

## 2021-09-11 DIAGNOSIS — I8500 Esophageal varices without bleeding: Secondary | ICD-10-CM | POA: Diagnosis not present

## 2021-09-11 DIAGNOSIS — Z7982 Long term (current) use of aspirin: Secondary | ICD-10-CM | POA: Diagnosis not present

## 2021-09-11 DIAGNOSIS — I509 Heart failure, unspecified: Secondary | ICD-10-CM | POA: Diagnosis not present

## 2021-09-11 DIAGNOSIS — M310 Hypersensitivity angiitis: Secondary | ICD-10-CM | POA: Diagnosis not present

## 2021-09-11 DIAGNOSIS — Z7952 Long term (current) use of systemic steroids: Secondary | ICD-10-CM | POA: Diagnosis not present

## 2021-09-11 DIAGNOSIS — N400 Enlarged prostate without lower urinary tract symptoms: Secondary | ICD-10-CM | POA: Diagnosis not present

## 2021-09-11 DIAGNOSIS — Z9181 History of falling: Secondary | ICD-10-CM | POA: Diagnosis not present

## 2021-09-12 DIAGNOSIS — D84821 Immunodeficiency due to drugs: Secondary | ICD-10-CM | POA: Diagnosis not present

## 2021-09-12 DIAGNOSIS — Z792 Long term (current) use of antibiotics: Secondary | ICD-10-CM | POA: Diagnosis not present

## 2021-09-12 DIAGNOSIS — Z944 Liver transplant status: Secondary | ICD-10-CM | POA: Diagnosis not present

## 2021-09-12 DIAGNOSIS — I8500 Esophageal varices without bleeding: Secondary | ICD-10-CM | POA: Diagnosis not present

## 2021-09-12 DIAGNOSIS — Z940 Kidney transplant status: Secondary | ICD-10-CM | POA: Diagnosis not present

## 2021-09-12 DIAGNOSIS — S62624G Displaced fracture of medial phalanx of right ring finger, subsequent encounter for fracture with delayed healing: Secondary | ICD-10-CM | POA: Diagnosis not present

## 2021-09-12 DIAGNOSIS — G8918 Other acute postprocedural pain: Secondary | ICD-10-CM | POA: Diagnosis not present

## 2021-09-12 DIAGNOSIS — Z79899 Other long term (current) drug therapy: Secondary | ICD-10-CM | POA: Diagnosis not present

## 2021-09-12 DIAGNOSIS — D849 Immunodeficiency, unspecified: Secondary | ICD-10-CM | POA: Diagnosis not present

## 2021-09-12 DIAGNOSIS — E1121 Type 2 diabetes mellitus with diabetic nephropathy: Secondary | ICD-10-CM | POA: Diagnosis not present

## 2021-09-12 DIAGNOSIS — Z794 Long term (current) use of insulin: Secondary | ICD-10-CM | POA: Diagnosis not present

## 2021-09-12 DIAGNOSIS — Z960 Presence of urogenital implants: Secondary | ICD-10-CM | POA: Diagnosis not present

## 2021-09-12 DIAGNOSIS — I10 Essential (primary) hypertension: Secondary | ICD-10-CM | POA: Diagnosis not present

## 2021-09-12 DIAGNOSIS — Z86718 Personal history of other venous thrombosis and embolism: Secondary | ICD-10-CM | POA: Diagnosis not present

## 2021-09-12 DIAGNOSIS — Z4823 Encounter for aftercare following liver transplant: Secondary | ICD-10-CM | POA: Diagnosis not present

## 2021-09-13 DIAGNOSIS — Z4822 Encounter for aftercare following kidney transplant: Secondary | ICD-10-CM | POA: Diagnosis not present

## 2021-09-13 DIAGNOSIS — Z4823 Encounter for aftercare following liver transplant: Secondary | ICD-10-CM | POA: Diagnosis not present

## 2021-09-13 DIAGNOSIS — I132 Hypertensive heart and chronic kidney disease with heart failure and with stage 5 chronic kidney disease, or end stage renal disease: Secondary | ICD-10-CM | POA: Diagnosis not present

## 2021-09-13 DIAGNOSIS — E1121 Type 2 diabetes mellitus with diabetic nephropathy: Secondary | ICD-10-CM | POA: Diagnosis not present

## 2021-09-13 DIAGNOSIS — I509 Heart failure, unspecified: Secondary | ICD-10-CM | POA: Diagnosis not present

## 2021-09-13 DIAGNOSIS — N186 End stage renal disease: Secondary | ICD-10-CM | POA: Diagnosis not present

## 2021-09-19 DIAGNOSIS — D84821 Immunodeficiency due to drugs: Secondary | ICD-10-CM | POA: Diagnosis not present

## 2021-09-19 DIAGNOSIS — I132 Hypertensive heart and chronic kidney disease with heart failure and with stage 5 chronic kidney disease, or end stage renal disease: Secondary | ICD-10-CM | POA: Diagnosis not present

## 2021-09-19 DIAGNOSIS — Z944 Liver transplant status: Secondary | ICD-10-CM | POA: Diagnosis not present

## 2021-09-19 DIAGNOSIS — Z940 Kidney transplant status: Secondary | ICD-10-CM | POA: Diagnosis not present

## 2021-09-19 DIAGNOSIS — G8918 Other acute postprocedural pain: Secondary | ICD-10-CM | POA: Diagnosis not present

## 2021-09-19 DIAGNOSIS — Z960 Presence of urogenital implants: Secondary | ICD-10-CM | POA: Diagnosis not present

## 2021-09-19 DIAGNOSIS — E1165 Type 2 diabetes mellitus with hyperglycemia: Secondary | ICD-10-CM | POA: Diagnosis not present

## 2021-09-19 DIAGNOSIS — E1121 Type 2 diabetes mellitus with diabetic nephropathy: Secondary | ICD-10-CM | POA: Diagnosis not present

## 2021-09-19 DIAGNOSIS — R739 Hyperglycemia, unspecified: Secondary | ICD-10-CM | POA: Diagnosis not present

## 2021-09-19 DIAGNOSIS — I509 Heart failure, unspecified: Secondary | ICD-10-CM | POA: Diagnosis not present

## 2021-09-19 DIAGNOSIS — Z4822 Encounter for aftercare following kidney transplant: Secondary | ICD-10-CM | POA: Diagnosis not present

## 2021-09-19 DIAGNOSIS — D849 Immunodeficiency, unspecified: Secondary | ICD-10-CM | POA: Diagnosis not present

## 2021-09-19 DIAGNOSIS — K219 Gastro-esophageal reflux disease without esophagitis: Secondary | ICD-10-CM | POA: Diagnosis not present

## 2021-09-19 DIAGNOSIS — I8290 Acute embolism and thrombosis of unspecified vein: Secondary | ICD-10-CM | POA: Diagnosis not present

## 2021-09-19 DIAGNOSIS — S62624D Displaced fracture of medial phalanx of right ring finger, subsequent encounter for fracture with routine healing: Secondary | ICD-10-CM | POA: Diagnosis not present

## 2021-09-19 DIAGNOSIS — Z792 Long term (current) use of antibiotics: Secondary | ICD-10-CM | POA: Diagnosis not present

## 2021-09-19 DIAGNOSIS — I110 Hypertensive heart disease with heart failure: Secondary | ICD-10-CM | POA: Diagnosis not present

## 2021-09-19 DIAGNOSIS — N186 End stage renal disease: Secondary | ICD-10-CM | POA: Diagnosis not present

## 2021-09-19 DIAGNOSIS — T380X5A Adverse effect of glucocorticoids and synthetic analogues, initial encounter: Secondary | ICD-10-CM | POA: Diagnosis not present

## 2021-09-19 DIAGNOSIS — R310 Gross hematuria: Secondary | ICD-10-CM | POA: Diagnosis not present

## 2021-09-19 DIAGNOSIS — Z4823 Encounter for aftercare following liver transplant: Secondary | ICD-10-CM | POA: Diagnosis not present

## 2021-09-19 DIAGNOSIS — Z79899 Other long term (current) drug therapy: Secondary | ICD-10-CM | POA: Diagnosis not present

## 2021-09-19 DIAGNOSIS — I10 Essential (primary) hypertension: Secondary | ICD-10-CM | POA: Diagnosis not present

## 2021-09-20 DIAGNOSIS — S62604D Fracture of unspecified phalanx of right ring finger, subsequent encounter for fracture with routine healing: Secondary | ICD-10-CM | POA: Diagnosis not present

## 2021-09-20 DIAGNOSIS — Y33XXXD Other specified events, undetermined intent, subsequent encounter: Secondary | ICD-10-CM | POA: Diagnosis not present

## 2021-09-20 DIAGNOSIS — Z9889 Other specified postprocedural states: Secondary | ICD-10-CM | POA: Diagnosis not present

## 2021-09-20 DIAGNOSIS — S62624D Displaced fracture of medial phalanx of right ring finger, subsequent encounter for fracture with routine healing: Secondary | ICD-10-CM | POA: Diagnosis not present

## 2021-09-21 DIAGNOSIS — Z944 Liver transplant status: Secondary | ICD-10-CM | POA: Diagnosis not present

## 2021-09-21 DIAGNOSIS — Z940 Kidney transplant status: Secondary | ICD-10-CM | POA: Diagnosis not present

## 2021-09-21 DIAGNOSIS — D849 Immunodeficiency, unspecified: Secondary | ICD-10-CM | POA: Diagnosis not present

## 2021-09-25 DIAGNOSIS — Z944 Liver transplant status: Secondary | ICD-10-CM | POA: Diagnosis not present

## 2021-09-25 DIAGNOSIS — D849 Immunodeficiency, unspecified: Secondary | ICD-10-CM | POA: Diagnosis not present

## 2021-09-25 DIAGNOSIS — T380X5A Adverse effect of glucocorticoids and synthetic analogues, initial encounter: Secondary | ICD-10-CM | POA: Diagnosis not present

## 2021-09-25 DIAGNOSIS — E1121 Type 2 diabetes mellitus with diabetic nephropathy: Secondary | ICD-10-CM | POA: Diagnosis not present

## 2021-09-25 DIAGNOSIS — E1165 Type 2 diabetes mellitus with hyperglycemia: Secondary | ICD-10-CM | POA: Diagnosis not present

## 2021-09-25 DIAGNOSIS — Z794 Long term (current) use of insulin: Secondary | ICD-10-CM | POA: Diagnosis not present

## 2021-09-25 DIAGNOSIS — Z940 Kidney transplant status: Secondary | ICD-10-CM | POA: Diagnosis not present

## 2021-09-25 DIAGNOSIS — R739 Hyperglycemia, unspecified: Secondary | ICD-10-CM | POA: Diagnosis not present

## 2021-09-27 DIAGNOSIS — Z944 Liver transplant status: Secondary | ICD-10-CM | POA: Diagnosis not present

## 2021-09-27 DIAGNOSIS — D849 Immunodeficiency, unspecified: Secondary | ICD-10-CM | POA: Diagnosis not present

## 2021-09-27 DIAGNOSIS — Z940 Kidney transplant status: Secondary | ICD-10-CM | POA: Diagnosis not present

## 2021-09-28 DIAGNOSIS — Z4822 Encounter for aftercare following kidney transplant: Secondary | ICD-10-CM | POA: Diagnosis not present

## 2021-09-28 DIAGNOSIS — Z4823 Encounter for aftercare following liver transplant: Secondary | ICD-10-CM | POA: Diagnosis not present

## 2021-09-28 DIAGNOSIS — N186 End stage renal disease: Secondary | ICD-10-CM | POA: Diagnosis not present

## 2021-09-28 DIAGNOSIS — E1121 Type 2 diabetes mellitus with diabetic nephropathy: Secondary | ICD-10-CM | POA: Diagnosis not present

## 2021-09-28 DIAGNOSIS — I509 Heart failure, unspecified: Secondary | ICD-10-CM | POA: Diagnosis not present

## 2021-09-28 DIAGNOSIS — I132 Hypertensive heart and chronic kidney disease with heart failure and with stage 5 chronic kidney disease, or end stage renal disease: Secondary | ICD-10-CM | POA: Diagnosis not present

## 2021-10-02 DIAGNOSIS — Z4822 Encounter for aftercare following kidney transplant: Secondary | ICD-10-CM | POA: Diagnosis not present

## 2021-10-02 DIAGNOSIS — I509 Heart failure, unspecified: Secondary | ICD-10-CM | POA: Diagnosis not present

## 2021-10-02 DIAGNOSIS — E1121 Type 2 diabetes mellitus with diabetic nephropathy: Secondary | ICD-10-CM | POA: Diagnosis not present

## 2021-10-02 DIAGNOSIS — Z4823 Encounter for aftercare following liver transplant: Secondary | ICD-10-CM | POA: Diagnosis not present

## 2021-10-02 DIAGNOSIS — I132 Hypertensive heart and chronic kidney disease with heart failure and with stage 5 chronic kidney disease, or end stage renal disease: Secondary | ICD-10-CM | POA: Diagnosis not present

## 2021-10-02 DIAGNOSIS — N186 End stage renal disease: Secondary | ICD-10-CM | POA: Diagnosis not present

## 2021-10-03 DIAGNOSIS — Z944 Liver transplant status: Secondary | ICD-10-CM | POA: Diagnosis not present

## 2021-10-03 DIAGNOSIS — K3189 Other diseases of stomach and duodenum: Secondary | ICD-10-CM | POA: Diagnosis not present

## 2021-10-03 DIAGNOSIS — Z86718 Personal history of other venous thrombosis and embolism: Secondary | ICD-10-CM | POA: Diagnosis not present

## 2021-10-03 DIAGNOSIS — Z792 Long term (current) use of antibiotics: Secondary | ICD-10-CM | POA: Diagnosis not present

## 2021-10-03 DIAGNOSIS — D849 Immunodeficiency, unspecified: Secondary | ICD-10-CM | POA: Diagnosis not present

## 2021-10-03 DIAGNOSIS — R739 Hyperglycemia, unspecified: Secondary | ICD-10-CM | POA: Diagnosis not present

## 2021-10-03 DIAGNOSIS — E1121 Type 2 diabetes mellitus with diabetic nephropathy: Secondary | ICD-10-CM | POA: Diagnosis not present

## 2021-10-03 DIAGNOSIS — Z940 Kidney transplant status: Secondary | ICD-10-CM | POA: Diagnosis not present

## 2021-10-03 DIAGNOSIS — I10 Essential (primary) hypertension: Secondary | ICD-10-CM | POA: Diagnosis not present

## 2021-10-03 DIAGNOSIS — T380X5A Adverse effect of glucocorticoids and synthetic analogues, initial encounter: Secondary | ICD-10-CM | POA: Diagnosis not present

## 2021-10-09 DIAGNOSIS — D849 Immunodeficiency, unspecified: Secondary | ICD-10-CM | POA: Diagnosis not present

## 2021-10-09 DIAGNOSIS — Z940 Kidney transplant status: Secondary | ICD-10-CM | POA: Diagnosis not present

## 2021-10-09 DIAGNOSIS — Z944 Liver transplant status: Secondary | ICD-10-CM | POA: Diagnosis not present

## 2021-10-10 DIAGNOSIS — I132 Hypertensive heart and chronic kidney disease with heart failure and with stage 5 chronic kidney disease, or end stage renal disease: Secondary | ICD-10-CM | POA: Diagnosis not present

## 2021-10-10 DIAGNOSIS — Z4822 Encounter for aftercare following kidney transplant: Secondary | ICD-10-CM | POA: Diagnosis not present

## 2021-10-10 DIAGNOSIS — I509 Heart failure, unspecified: Secondary | ICD-10-CM | POA: Diagnosis not present

## 2021-10-10 DIAGNOSIS — E1121 Type 2 diabetes mellitus with diabetic nephropathy: Secondary | ICD-10-CM | POA: Diagnosis not present

## 2021-10-10 DIAGNOSIS — N186 End stage renal disease: Secondary | ICD-10-CM | POA: Diagnosis not present

## 2021-10-10 DIAGNOSIS — Z4823 Encounter for aftercare following liver transplant: Secondary | ICD-10-CM | POA: Diagnosis not present

## 2021-10-15 ENCOUNTER — Other Ambulatory Visit: Payer: Self-pay

## 2021-10-15 DIAGNOSIS — E1121 Type 2 diabetes mellitus with diabetic nephropathy: Secondary | ICD-10-CM

## 2021-10-15 DIAGNOSIS — Z794 Long term (current) use of insulin: Secondary | ICD-10-CM

## 2021-10-15 MED ORDER — TRESIBA FLEXTOUCH 100 UNIT/ML ~~LOC~~ SOPN: 14.0000 [IU] | PEN_INJECTOR | Freq: Every day | SUBCUTANEOUS | 0 refills | Status: DC

## 2021-10-15 NOTE — Progress Notes (Signed)
Received a fax from [facility] for refill of Tresiba flextouch pen ?

## 2021-10-17 DIAGNOSIS — Z86718 Personal history of other venous thrombosis and embolism: Secondary | ICD-10-CM | POA: Diagnosis not present

## 2021-10-17 DIAGNOSIS — R451 Restlessness and agitation: Secondary | ICD-10-CM | POA: Diagnosis not present

## 2021-10-17 DIAGNOSIS — E1122 Type 2 diabetes mellitus with diabetic chronic kidney disease: Secondary | ICD-10-CM | POA: Diagnosis not present

## 2021-10-17 DIAGNOSIS — Z940 Kidney transplant status: Secondary | ICD-10-CM | POA: Diagnosis not present

## 2021-10-17 DIAGNOSIS — M310 Hypersensitivity angiitis: Secondary | ICD-10-CM | POA: Diagnosis not present

## 2021-10-17 DIAGNOSIS — N186 End stage renal disease: Secondary | ICD-10-CM | POA: Diagnosis not present

## 2021-10-17 DIAGNOSIS — Z9889 Other specified postprocedural states: Secondary | ICD-10-CM | POA: Diagnosis not present

## 2021-10-17 DIAGNOSIS — D849 Immunodeficiency, unspecified: Secondary | ICD-10-CM | POA: Diagnosis not present

## 2021-10-17 DIAGNOSIS — Z79899 Other long term (current) drug therapy: Secondary | ICD-10-CM | POA: Diagnosis not present

## 2021-10-17 DIAGNOSIS — K7581 Nonalcoholic steatohepatitis (NASH): Secondary | ICD-10-CM | POA: Diagnosis not present

## 2021-10-17 DIAGNOSIS — I82C11 Acute embolism and thrombosis of right internal jugular vein: Secondary | ICD-10-CM | POA: Diagnosis not present

## 2021-10-17 DIAGNOSIS — R410 Disorientation, unspecified: Secondary | ICD-10-CM | POA: Diagnosis not present

## 2021-10-17 DIAGNOSIS — S62624A Displaced fracture of medial phalanx of right ring finger, initial encounter for closed fracture: Secondary | ICD-10-CM | POA: Diagnosis not present

## 2021-10-17 DIAGNOSIS — K3189 Other diseases of stomach and duodenum: Secondary | ICD-10-CM | POA: Diagnosis not present

## 2021-10-17 DIAGNOSIS — I509 Heart failure, unspecified: Secondary | ICD-10-CM | POA: Diagnosis not present

## 2021-10-17 DIAGNOSIS — D649 Anemia, unspecified: Secondary | ICD-10-CM | POA: Diagnosis not present

## 2021-10-17 DIAGNOSIS — Z4823 Encounter for aftercare following liver transplant: Secondary | ICD-10-CM | POA: Diagnosis not present

## 2021-10-17 DIAGNOSIS — E1165 Type 2 diabetes mellitus with hyperglycemia: Secondary | ICD-10-CM | POA: Diagnosis not present

## 2021-10-17 DIAGNOSIS — I132 Hypertensive heart and chronic kidney disease with heart failure and with stage 5 chronic kidney disease, or end stage renal disease: Secondary | ICD-10-CM | POA: Diagnosis not present

## 2021-10-17 DIAGNOSIS — N400 Enlarged prostate without lower urinary tract symptoms: Secondary | ICD-10-CM | POA: Diagnosis not present

## 2021-10-17 DIAGNOSIS — Z944 Liver transplant status: Secondary | ICD-10-CM | POA: Diagnosis not present

## 2021-10-18 ENCOUNTER — Other Ambulatory Visit: Payer: Self-pay | Admitting: Endocrinology

## 2021-11-07 DIAGNOSIS — Z944 Liver transplant status: Secondary | ICD-10-CM | POA: Diagnosis not present

## 2021-11-07 DIAGNOSIS — Z940 Kidney transplant status: Secondary | ICD-10-CM | POA: Diagnosis not present

## 2021-11-07 DIAGNOSIS — D849 Immunodeficiency, unspecified: Secondary | ICD-10-CM | POA: Diagnosis not present

## 2021-11-07 DIAGNOSIS — Z5181 Encounter for therapeutic drug level monitoring: Secondary | ICD-10-CM | POA: Diagnosis not present

## 2021-11-21 DIAGNOSIS — Z5181 Encounter for therapeutic drug level monitoring: Secondary | ICD-10-CM | POA: Diagnosis not present

## 2021-11-21 DIAGNOSIS — Z944 Liver transplant status: Secondary | ICD-10-CM | POA: Diagnosis not present

## 2021-11-21 DIAGNOSIS — Z940 Kidney transplant status: Secondary | ICD-10-CM | POA: Diagnosis not present

## 2021-11-21 DIAGNOSIS — D849 Immunodeficiency, unspecified: Secondary | ICD-10-CM | POA: Diagnosis not present

## 2021-11-28 ENCOUNTER — Ambulatory Visit: Payer: Medicare Other | Admitting: Emergency Medicine

## 2021-11-28 DIAGNOSIS — D849 Immunodeficiency, unspecified: Secondary | ICD-10-CM | POA: Diagnosis not present

## 2021-11-28 DIAGNOSIS — Z940 Kidney transplant status: Secondary | ICD-10-CM | POA: Diagnosis not present

## 2021-11-28 DIAGNOSIS — Z944 Liver transplant status: Secondary | ICD-10-CM | POA: Diagnosis not present

## 2021-11-28 DIAGNOSIS — Z5181 Encounter for therapeutic drug level monitoring: Secondary | ICD-10-CM | POA: Diagnosis not present

## 2021-12-07 DIAGNOSIS — D849 Immunodeficiency, unspecified: Secondary | ICD-10-CM | POA: Diagnosis not present

## 2021-12-07 DIAGNOSIS — Z5181 Encounter for therapeutic drug level monitoring: Secondary | ICD-10-CM | POA: Diagnosis not present

## 2021-12-07 DIAGNOSIS — Z944 Liver transplant status: Secondary | ICD-10-CM | POA: Diagnosis not present

## 2021-12-07 DIAGNOSIS — Z940 Kidney transplant status: Secondary | ICD-10-CM | POA: Diagnosis not present

## 2021-12-12 DIAGNOSIS — N186 End stage renal disease: Secondary | ICD-10-CM | POA: Diagnosis not present

## 2021-12-12 DIAGNOSIS — K319 Disease of stomach and duodenum, unspecified: Secondary | ICD-10-CM | POA: Diagnosis not present

## 2021-12-12 DIAGNOSIS — Z944 Liver transplant status: Secondary | ICD-10-CM | POA: Diagnosis not present

## 2021-12-12 DIAGNOSIS — D849 Immunodeficiency, unspecified: Secondary | ICD-10-CM | POA: Diagnosis not present

## 2021-12-12 DIAGNOSIS — Z79899 Other long term (current) drug therapy: Secondary | ICD-10-CM | POA: Diagnosis not present

## 2021-12-12 DIAGNOSIS — Z5181 Encounter for therapeutic drug level monitoring: Secondary | ICD-10-CM | POA: Diagnosis not present

## 2021-12-12 DIAGNOSIS — E1122 Type 2 diabetes mellitus with diabetic chronic kidney disease: Secondary | ICD-10-CM | POA: Diagnosis not present

## 2021-12-12 DIAGNOSIS — Z4823 Encounter for aftercare following liver transplant: Secondary | ICD-10-CM | POA: Diagnosis not present

## 2021-12-12 DIAGNOSIS — D702 Other drug-induced agranulocytosis: Secondary | ICD-10-CM | POA: Diagnosis not present

## 2021-12-12 DIAGNOSIS — D84821 Immunodeficiency due to drugs: Secondary | ICD-10-CM | POA: Diagnosis not present

## 2021-12-12 DIAGNOSIS — I120 Hypertensive chronic kidney disease with stage 5 chronic kidney disease or end stage renal disease: Secondary | ICD-10-CM | POA: Diagnosis not present

## 2021-12-12 DIAGNOSIS — Z940 Kidney transplant status: Secondary | ICD-10-CM | POA: Diagnosis not present

## 2021-12-12 DIAGNOSIS — K3189 Other diseases of stomach and duodenum: Secondary | ICD-10-CM | POA: Diagnosis not present

## 2021-12-14 DIAGNOSIS — Z944 Liver transplant status: Secondary | ICD-10-CM | POA: Diagnosis not present

## 2021-12-14 DIAGNOSIS — D849 Immunodeficiency, unspecified: Secondary | ICD-10-CM | POA: Diagnosis not present

## 2021-12-14 DIAGNOSIS — Z5181 Encounter for therapeutic drug level monitoring: Secondary | ICD-10-CM | POA: Diagnosis not present

## 2021-12-14 DIAGNOSIS — Z940 Kidney transplant status: Secondary | ICD-10-CM | POA: Diagnosis not present

## 2021-12-20 DIAGNOSIS — D849 Immunodeficiency, unspecified: Secondary | ICD-10-CM | POA: Diagnosis not present

## 2021-12-20 DIAGNOSIS — Z944 Liver transplant status: Secondary | ICD-10-CM | POA: Diagnosis not present

## 2021-12-21 ENCOUNTER — Other Ambulatory Visit: Payer: Self-pay | Admitting: Emergency Medicine

## 2021-12-21 DIAGNOSIS — Z794 Long term (current) use of insulin: Secondary | ICD-10-CM

## 2021-12-25 DIAGNOSIS — Z940 Kidney transplant status: Secondary | ICD-10-CM | POA: Diagnosis not present

## 2021-12-25 DIAGNOSIS — D849 Immunodeficiency, unspecified: Secondary | ICD-10-CM | POA: Diagnosis not present

## 2021-12-25 DIAGNOSIS — Z5181 Encounter for therapeutic drug level monitoring: Secondary | ICD-10-CM | POA: Diagnosis not present

## 2021-12-25 DIAGNOSIS — Z944 Liver transplant status: Secondary | ICD-10-CM | POA: Diagnosis not present

## 2022-01-01 DIAGNOSIS — Z944 Liver transplant status: Secondary | ICD-10-CM | POA: Diagnosis not present

## 2022-01-01 DIAGNOSIS — Z940 Kidney transplant status: Secondary | ICD-10-CM | POA: Diagnosis not present

## 2022-01-01 DIAGNOSIS — R7989 Other specified abnormal findings of blood chemistry: Secondary | ICD-10-CM | POA: Diagnosis not present

## 2022-01-01 DIAGNOSIS — D849 Immunodeficiency, unspecified: Secondary | ICD-10-CM | POA: Diagnosis not present

## 2022-02-22 ENCOUNTER — Other Ambulatory Visit: Payer: Self-pay

## 2022-02-22 ENCOUNTER — Encounter (HOSPITAL_COMMUNITY): Payer: Self-pay | Admitting: Emergency Medicine

## 2022-02-22 ENCOUNTER — Emergency Department (HOSPITAL_COMMUNITY): Payer: Medicare (Managed Care)

## 2022-02-22 ENCOUNTER — Emergency Department (HOSPITAL_COMMUNITY)
Admission: EM | Admit: 2022-02-22 | Discharge: 2022-02-22 | Disposition: A | Discharge status: Other Acute Inpatient Hospital | Payer: Medicare (Managed Care) | Attending: Emergency Medicine | Admitting: Emergency Medicine

## 2022-02-22 ENCOUNTER — Encounter (HOSPITAL_COMMUNITY): Payer: Self-pay

## 2022-02-22 DIAGNOSIS — Z20822 Contact with and (suspected) exposure to covid-19: Secondary | ICD-10-CM | POA: Diagnosis not present

## 2022-02-22 DIAGNOSIS — Z794 Long term (current) use of insulin: Secondary | ICD-10-CM | POA: Diagnosis not present

## 2022-02-22 DIAGNOSIS — A419 Sepsis, unspecified organism: Secondary | ICD-10-CM | POA: Diagnosis not present

## 2022-02-22 DIAGNOSIS — E119 Type 2 diabetes mellitus without complications: Secondary | ICD-10-CM | POA: Diagnosis not present

## 2022-02-22 DIAGNOSIS — Z940 Kidney transplant status: Secondary | ICD-10-CM

## 2022-02-22 DIAGNOSIS — I110 Hypertensive heart disease with heart failure: Secondary | ICD-10-CM | POA: Diagnosis not present

## 2022-02-22 DIAGNOSIS — I509 Heart failure, unspecified: Secondary | ICD-10-CM | POA: Insufficient documentation

## 2022-02-22 DIAGNOSIS — Z944 Liver transplant status: Secondary | ICD-10-CM | POA: Diagnosis not present

## 2022-02-22 DIAGNOSIS — R509 Fever, unspecified: Secondary | ICD-10-CM | POA: Diagnosis present

## 2022-02-22 LAB — RESP PANEL BY RT-PCR (FLU A&B, COVID) ARPGX2
Influenza A by PCR: NEGATIVE
Influenza B by PCR: NEGATIVE
SARS Coronavirus 2 by RT PCR: NEGATIVE

## 2022-02-22 LAB — CBC WITH DIFFERENTIAL/PLATELET
Abs Immature Granulocytes: 0.05 10*3/uL (ref 0.00–0.07)
Basophils Absolute: 0 10*3/uL (ref 0.0–0.1)
Basophils Relative: 0 %
Eosinophils Absolute: 0 10*3/uL (ref 0.0–0.5)
Eosinophils Relative: 1 %
HCT: 38.9 % — ABNORMAL LOW (ref 39.0–52.0)
Hemoglobin: 12.4 g/dL — ABNORMAL LOW (ref 13.0–17.0)
Immature Granulocytes: 1 %
Lymphocytes Relative: 21 %
Lymphs Abs: 1.4 10*3/uL (ref 0.7–4.0)
MCH: 25.1 pg — ABNORMAL LOW (ref 26.0–34.0)
MCHC: 31.9 g/dL (ref 30.0–36.0)
MCV: 78.7 fL — ABNORMAL LOW (ref 80.0–100.0)
Monocytes Absolute: 0.2 10*3/uL (ref 0.1–1.0)
Monocytes Relative: 2 %
Neutro Abs: 5 10*3/uL (ref 1.7–7.7)
Neutrophils Relative %: 75 %
Platelets: 82 10*3/uL — ABNORMAL LOW (ref 150–400)
RBC: 4.94 MIL/uL (ref 4.22–5.81)
RDW: 13.5 % (ref 11.5–15.5)
WBC: 6.7 10*3/uL (ref 4.0–10.5)
nRBC: 0 % (ref 0.0–0.2)

## 2022-02-22 LAB — COMPREHENSIVE METABOLIC PANEL
ALT: 46 U/L — ABNORMAL HIGH (ref 0–44)
AST: 30 U/L (ref 15–41)
Albumin: 4.1 g/dL (ref 3.5–5.0)
Alkaline Phosphatase: 74 U/L (ref 38–126)
Anion gap: 11 (ref 5–15)
BUN: 32 mg/dL — ABNORMAL HIGH (ref 8–23)
CO2: 25 mmol/L (ref 22–32)
Calcium: 8.6 mg/dL — ABNORMAL LOW (ref 8.9–10.3)
Chloride: 98 mmol/L (ref 98–111)
Creatinine, Ser: 1.68 mg/dL — ABNORMAL HIGH (ref 0.61–1.24)
GFR, Estimated: 43 mL/min — ABNORMAL LOW (ref >60)
Glucose, Bld: 67 mg/dL — ABNORMAL LOW (ref 70–99)
Potassium: 3.9 mmol/L (ref 3.5–5.1)
Sodium: 134 mmol/L — ABNORMAL LOW (ref 135–145)
Total Bilirubin: 1.4 mg/dL — ABNORMAL HIGH (ref 0.3–1.2)
Total Protein: 7 g/dL (ref 6.5–8.1)

## 2022-02-22 LAB — PROTIME-INR
INR: 1.2 (ref 0.8–1.2)
Prothrombin Time: 15.2 seconds (ref 11.4–15.2)

## 2022-02-22 LAB — APTT: aPTT: 29 seconds (ref 24–36)

## 2022-02-22 LAB — LACTIC ACID, PLASMA
Lactic Acid, Venous: 2 mmol/L (ref 0.5–1.9)
Lactic Acid, Venous: 1.8 mmol/L (ref 0.5–1.9)

## 2022-02-22 MED ORDER — ACETAMINOPHEN 500 MG PO TABS
1000.0000 mg | ORAL_TABLET | Freq: Once | ORAL | Status: AC
  Administered 2022-02-22: 1000 mg via ORAL
  Filled 2022-02-22: qty 2

## 2022-02-22 MED ORDER — LACTATED RINGERS IV BOLUS (SEPSIS)
1000.0000 mL | Freq: Once | INTRAVENOUS | Status: AC
Start: 2022-02-22 — End: 2022-02-22
  Administered 2022-02-22: 1000 mL via INTRAVENOUS

## 2022-02-22 MED ORDER — METRONIDAZOLE 500 MG/100ML IV SOLN
500.0000 mg | Freq: Once | INTRAVENOUS | Status: AC
Start: 2022-02-22 — End: 2022-02-22
  Administered 2022-02-22: 500 mg via INTRAVENOUS
  Filled 2022-02-22: qty 100

## 2022-02-22 MED ORDER — LACTATED RINGERS IV SOLN: INTRAVENOUS | Status: DC

## 2022-02-22 MED ORDER — VANCOMYCIN HCL IN DEXTROSE 1-5 GM/200ML-% IV SOLN
1000.0000 mg | Freq: Once | INTRAVENOUS | Status: AC
  Administered 2022-02-22: 1000 mg via INTRAVENOUS
  Filled 2022-02-22: qty 200

## 2022-02-22 MED ORDER — SODIUM CHLORIDE 0.9 % IV SOLN
2.0000 g | Freq: Once | INTRAVENOUS | Status: AC
  Administered 2022-02-22: 2 g via INTRAVENOUS
  Filled 2022-02-22: qty 12.5

## 2022-02-22 MED ORDER — LACTATED RINGERS IV BOLUS (SEPSIS): 250.0000 mL | Freq: Once | INTRAVENOUS | Status: DC

## 2022-02-22 NOTE — ED Provider Notes (Addendum)
Was made aware that patient accidentally got 46m/kg of vancomycin instead of 20 to 25 mg/kg of vancomycin.  I have made deep team aware of this to more closely monitor his renal function.  Nursing staff accidentally gave 2 g instead of 1 g.  Pharmacy has been made aware, nursing staff made me aware of this.  I have relayed this information to patient's [facility] team.  I have made patient aware of this as well.  [facility]   CLennice Sites DO 02/22/22 1956    CLennice Sites DO 02/22/22 1[place] AVado DO 02/22/22 2104

## 2022-02-22 NOTE — ED Triage Notes (Signed)
Patient presents due to lethargy for 2 days. His family has been sick with flu like symptoms. Patient has been coughing and sneezing. EMS noted congestion in the upper lobes.   EMS vitals: 90/45 BP 90's HR 99% SPO2 on 3 L nasal canula.  220 CBG 102 temp

## 2022-02-22 NOTE — ED Notes (Signed)
[facility] [place], [place], pt: RM # [phone] Nurse report call to : [phone] send paper chart and power share all images.

## 2022-02-22 NOTE — Sepsis Progress Note (Signed)
eLink is following this Code Sepsis. °

## 2022-02-22 NOTE — ED Provider Notes (Addendum)
[place] [facility] DEPT Provider Note   CSN: [id number] Arrival date & time: 02/22/22  1244     History  Chief Complaint  Patient presents with   Hypotension   Fatigue   Cough    William [name] is a 71 y.o. male.  HPI Status post kidney liver transplant at [facility] early January of this year, 1\6\2023.  Patient is chronically immunosuppressed with MMF 250/500 , cyclosporine 125\100 and prednisone 10 mg daily.  Other medical history includes congestive heart failure, essential hypertension, esophageal varices, Goodpasture's syndrome, type 2 diabetes.  Patient son lives with patient and assisting caregiving.  He reports that the patient was doing well as of yesterday.  This morning he awakened with chills and had a fever of 102.  Patient reports he feels generally achy.  Patient son reports he has noted a lot of coughing now.  Patient does not have focal complaints of pain.  He is denying abdominal pain or back pain.  He denies chest pain.  Patient denies he has a headache.  He reports he had a headache in the morning earlier but it is gone now.  He reports he does feel somewhat generally achy.  Patient son reports he gave Tylenol at about 730 this morning.    Home Medications Prior to Admission medications   Medication Sig Start Date End Date Taking? Authorizing Provider  acetaminophen (TYLENOL) 650 MG CR tablet Take 650 mg by mouth every 8 (eight) hours as needed for pain.    [provider]  calcium acetate (PHOSLO) 667 MG tablet Take 667 mg by mouth 3 (three) times daily. 05/09/21   [provider]  Insulin Pen Needle (ADVOCATE INSULIN PEN NEEDLES) 31G X 5 MM MISC Use four times daily to inject insulin 04/27/18   Elayne Snare, MD  insulin regular (NOVOLIN R,HUMULIN R) 100 units/mL injection Inject 6-8 Units into the skin See admin instructions. Inject 8 units subcutaneously with breakfast, and 6 units with lunch and supper Sliding scale     [provider]  lactulose (CHRONULAC) 10 GM/15ML solution Take 45 mLs (30 g total) by mouth 3 (three) times daily. Patient taking differently: Take 30-45 g by mouth 3 (three) times daily. 06/14/21   Horald Pollen, MD  lidocaine-prilocaine (EMLA) cream Apply 1 application topically See admin instructions. Apply topically prior to dialysis on Tuesday, Thursday, Saturday 08/24/18   [provider]  Multiple Vitamin (MULTIVITAMIN WITH MINERALS) TABS tablet Take 1 tablet by mouth daily.    [provider]  mupirocin ointment (BACTROBAN) 2 % Apply 1 application topically daily. Patient not taking: Reported on 07/26/2021 05/30/21   Horald Pollen, MD  ondansetron (ZOFRAN ODT) 4 MG disintegrating tablet Take 1 tablet (4 mg total) by mouth 2 (two) times daily as needed for nausea or vomiting. 05/01/20   [name], Madelynn Done C, PA-C  ONE TOUCH LANCETS MISC Use to test blood sugar three times daily 04/27/18   Elayne Snare, MD  [facility] VERIO test strip USE 1 STRIP TO CHECK GLUCOSE THREE TIMES DAILY 07/23/21   Elayne Snare, MD  oxyCODONE-acetaminophen (PERCOCET) 5-325 MG tablet Take 1 tablet by mouth every 6 (six) hours as needed. 07/27/21   Rhyne, Hulen Shouts, PA-C  rifaximin (XIFAXAN) 550 MG TABS tablet Take 1 tablet (550 mg total) by mouth 2 (two) times daily. 07/07/20   Shawna Clamp, MD  rosuvastatin (CRESTOR) 5 MG tablet Take 1 tablet (5 mg total) by mouth daily. Patient not taking: Reported on  07/26/2021 10/18/20   Elayne Snare, MD  sevelamer carbonate (RENVELA) 800 MG tablet Take 800 mg by mouth 3 (three) times daily. 09/25/20   [provider]  TRESIBA FLEXTOUCH 100 UNIT/ML FlexTouch Pen INJECT 14 UNITS SUBCUTANEOUSLY ONCE DAILY 12/21/21   Horald Pollen, MD      Allergies    Heparin and Pork-derived products    Review of Systems   Review of Systems 10 systems reviewed negative except as per HPI Physical Exam Updated Vital Signs BP (!) 107/51    Pulse 100   Temp 99 F (37.2 C) (Oral)   Resp 20   SpO2 94%  Physical Exam Constitutional:      Comments: Patient appears to be having chills and is uncomfortable.  He is alert.  He does not have respiratory distress but fairly frequent cough.  HENT:     Head: Normocephalic and atraumatic.     Mouth/Throat:     Pharynx: Oropharynx is clear.  Eyes:     Extraocular Movements: Extraocular movements intact.  Cardiovascular:     Comments: Tachycardia no gross rub murmur gallop Pulmonary:     Comments: Tachypnea but not respiratory distress.  Lungs are grossly clear. Abdominal:     Comments: Abdomen has well-healed scar.  Soft and nontender.  No significant distention.  No guarding.  Musculoskeletal:     Comments: No peripheral edema.  Calves are soft and pliable.  No wounds to the feet or lower extremities.  Skin:    General: Skin is warm and dry.  Neurological:     Comments: Patient is alert.  He is following commands.  Patient's son is translating.  No focal neurologic deficits evident.  Patient can move all extremities at well.     ED Results / Procedures / Treatments   Labs (all labs ordered are listed, but only abnormal results are displayed) Labs Reviewed  COMPREHENSIVE METABOLIC PANEL - Abnormal; Notable for the following components:      Result Value   Sodium 134 (*)    Glucose, Bld 67 (*)    BUN 32 (*)    Creatinine, Ser 1.68 (*)    Calcium 8.6 (*)    ALT 46 (*)    Total Bilirubin 1.4 (*)    GFR, Estimated 43 (*)    All other components within normal limits  LACTIC ACID, PLASMA - Abnormal; Notable for the following components:   Lactic Acid, Venous 2.0 (*)    All other components within normal limits  CBC WITH DIFFERENTIAL/PLATELET - Abnormal; Notable for the following components:   Hemoglobin 12.4 (*)    HCT 38.9 (*)    MCV 78.7 (*)    MCH 25.1 (*)    Platelets 82 (*)    All other components within normal limits  CULTURE, BLOOD (ROUTINE X 2)  CULTURE, BLOOD  (ROUTINE X 2)  RESP PANEL BY RT-PCR (FLU A&B, COVID) ARPGX2  URINE CULTURE  PROTIME-INR  APTT  LACTIC ACID, PLASMA  URINALYSIS, ROUTINE W REFLEX MICROSCOPIC    EKG EKG Interpretation  Date/Time:  Friday February 22 2022 13:26:35 EDT Ventricular Rate:  109 PR Interval:  148 QRS Duration: 83 QT Interval:  306 QTC Calculation: 412 R Axis:   39 Text Interpretation: Sinus tachycardia Multiple premature complexes, vent & supraven Baseline wander in lead(s) V4 V5 artifact, no sig change from previous Confirmed by Charlesetta Shanks [phone]) on 02/22/2022 2:03:52 PM  Radiology DG Chest 2 View  Result Date: 02/22/2022 CLINICAL DATA:  Sepsis.  EXAM: CHEST - 2 VIEW COMPARISON:  July 04, 2021. FINDINGS: The heart size and mediastinal contours are within normal limits. Left lung is clear. Minimal right basilar subsegmental atelectasis is noted. The visualized skeletal structures are unremarkable. IMPRESSION: Minimal right basilar subsegmental atelectasis. Electronically Signed   By: Marijo Conception M.D.   On: 02/22/2022 13:50    Procedures Procedures   CRITICAL CARE Performed by: Charlesetta Shanks   Total critical care time: 60 minutes  Critical care time was exclusive of separately billable procedures and treating other patients.  Critical care was necessary to treat or prevent imminent or life-threatening deterioration.  Critical care was time spent personally by me on the following activities: development of treatment plan with patient and/or surrogate as well as nursing, discussions with consultants, evaluation of patient's response to treatment, examination of patient, obtaining history from patient or surrogate, ordering and performing treatments and interventions, ordering and review of laboratory studies, ordering and review of radiographic studies, pulse oximetry and re-evaluation of patient's condition.  Medications Ordered in ED Medications  lactated ringers infusion (has no  administration in time range)  lactated ringers bolus 1,000 mL (1,000 mLs Intravenous New Bag/Given 02/22/22 1523)    And  lactated ringers bolus 1,000 mL (1,000 mLs Intravenous New Bag/Given 02/22/22 1628)    And  lactated ringers bolus 250 mL (has no administration in time range)  ceFEPIme (MAXIPIME) 2 g in sodium chloride 0.9 % 100 mL IVPB (2 g Intravenous New Bag/Given 02/22/22 1448)  metroNIDAZOLE (FLAGYL) IVPB 500 mg (500 mg Intravenous New Bag/Given 02/22/22 1527)  vancomycin (VANCOCIN) IVPB 1000 mg/200 mL premix (1,000 mg Intravenous New Bag/Given 02/22/22 1629)  acetaminophen (TYLENOL) tablet 1,000 mg (1,000 mg Oral Given 02/22/22 1454)    ED Course/ Medical Decision Making/ A&P                           Medical Decision Making Amount and/or Complexity of Data Reviewed Labs: ordered. Radiology: ordered. ECG/medicine tests: ordered.  Risk OTC drugs. Prescription drug management.   Patient presents with complex medical history of kidney liver transplant within the past 7 months.  Patient is actively immunosuppressed.  He awakened this morning with fever of 102 cough and body aches.  Patient is tachycardic and tachypneic.  Will start sepsis protocol.  Mental status is alert and he does not have active respiratory distress or hypoxia at this time.  We will continue to monitor closely.  Addition to starting sepsis protocol we will immediately place consult to [facility] transplant team for additional recommendations.  Consult: Reviewed with Dr. Dawna Part transplant specialist.  She is in agreement with plan of sepsis protocol with fluid resuscitation and empiric antibiotics.  He will plan to admit to [facility].  Request call back when labs have resulted.  [facility] excepts for transfer.  Accepted by Dr. Dawna Part and Dr. Gerald Dexter, the patient's transplant surgeon.  His blood pressure remains stable greater than 858 systolic.  Patient has not had decreasing blood pressure during treatment.  Heart  rate consistent at low 100s.  Awaiting bed assignment at [facility] for transfer.    Final Clinical Impression(s) / ED Diagnoses Final diagnoses:  Sepsis, due to unspecified organism, unspecified whether acute organ dysfunction present [facility])  H/O kidney transplant  H/O liver transplant [facility])    Rx / DC Orders ED Discharge Orders     None         Charlesetta Shanks, MD 02/22/22  [place], MD 02/22/22 1744

## 2022-02-22 NOTE — Sepsis Progress Note (Signed)
Secure chat with bedside RN confirmed that the blood cultures were indeed drawn before the antibiotic was given.

## 2022-02-22 NOTE — Progress Notes (Signed)
PHARMACY -  BRIEF ANTIBIOTIC NOTE   Pharmacy has received consult(s) for vancomycin and cefepime from an ED provider.  The patient's profile has been reviewed for ht/wt/allergies/indication/available labs.    One time order(s) placed for vancomycin 1 g + cefepime 2 g  Further antibiotics/pharmacy consults should be ordered by admitting physician if indicated.                       Thank you,  Tawnya Crook, PharmD, BCPS Clinical Pharmacist 02/22/2022 1:59 PM

## 2022-02-22 NOTE — ED Notes (Addendum)
Medication error was made. Patient was accidentally given the wrong medication dose (too much, 2 g instead of 1 g). MD and pharmacy made aware.

## 2022-02-27 LAB — CULTURE, BLOOD (ROUTINE X 2)
Culture: NO GROWTH
Culture: NO GROWTH
Special Requests: ADEQUATE
Special Requests: ADEQUATE

## 2022-03-05 ENCOUNTER — Ambulatory Visit (INDEPENDENT_AMBULATORY_CARE_PROVIDER_SITE_OTHER): Payer: Medicare (Managed Care) | Admitting: Emergency Medicine

## 2022-03-05 ENCOUNTER — Encounter: Payer: Self-pay | Admitting: Emergency Medicine

## 2022-03-05 VITALS — BP 122/64 | HR 79 | Temp 97.7°F | Ht 65.0 in | Wt 144.2 lb

## 2022-03-05 DIAGNOSIS — Z992 Dependence on renal dialysis: Secondary | ICD-10-CM

## 2022-03-05 DIAGNOSIS — E1121 Type 2 diabetes mellitus with diabetic nephropathy: Secondary | ICD-10-CM

## 2022-03-05 DIAGNOSIS — Z8719 Personal history of other diseases of the digestive system: Secondary | ICD-10-CM

## 2022-03-05 DIAGNOSIS — N186 End stage renal disease: Secondary | ICD-10-CM

## 2022-03-05 DIAGNOSIS — I5022 Chronic systolic (congestive) heart failure: Secondary | ICD-10-CM

## 2022-03-05 DIAGNOSIS — K7460 Unspecified cirrhosis of liver: Secondary | ICD-10-CM | POA: Diagnosis not present

## 2022-03-05 DIAGNOSIS — Z794 Long term (current) use of insulin: Secondary | ICD-10-CM | POA: Diagnosis not present

## 2022-03-05 DIAGNOSIS — R188 Other ascites: Secondary | ICD-10-CM

## 2022-03-05 LAB — POCT GLYCOSYLATED HEMOGLOBIN (HGB A1C): Hemoglobin A1C: 9.3 % — AB (ref 4.0–5.6)

## 2022-03-05 NOTE — Assessment & Plan Note (Signed)
Elevated hemoglobin A1c. Recently advised by endocrinologist at [facility] to decrease amount of bedtime insulin due to low sugar levels in the morning.

## 2022-03-05 NOTE — Progress Notes (Signed)
William [name] 71 y.o.   Chief Complaint  Patient presents with   Follow-up    No concerns    HISTORY OF PRESENT ILLNESS: This is a 71 y.o. male here for 39-monthfollow-up. Dialysis diabetic patient with known alcoholic liver cirrhosis[facility]. Recently advised to decrease amount of bedtime insulin due to low blood sugar readings in the morning. Accompanied by his son.  No complaints or medical concerns today. Requesting dentist referral.  HPI   Prior to Admission medications   Medication Sig Start Date End Date Taking? Authorizing Provider  acetaminophen (TYLENOL) 650 MG CR tablet Take 650 mg by mouth every 8 (eight) hours as needed for pain.   Yes [provider]  Calcium Citrate-Vitamin D 315-5 MG-MCG TABS Take by mouth. 10/03/21  Yes [provider]  cycloSPORINE modified (NEORAL) 100 MG capsule Take 1241m(one of the 10047mapsules along with one of the 59m81mpsules) in the morning AND Take 125 mg (one of the 100mg28msules along with one of the 59mg 53mules) in the evening, by mouth. 01/17/22  Yes [provider]  cycloSPORINE modified (NEORAL) 25 MG capsule Take 159mg (12mof the 100mg ca70mes along with one of the 59mg cap93ms) in the morning AND Take 159mg (one65mthe 100mg capsu88malong with one of the 59mg capsul40min the evening, by mouth. 01/17/22  Yes [provider]  GENGRAF 100 MG capsule Take 100 mg by mouth 2 (two) times daily. 02/26/22  Yes [provider]  GENGRAF 25 MG capsule Take by mouth. 02/26/22  Yes [provider]  Insulin Pen Needle (ADVOCATE INSULIN PEN NEEDLES) 31G X 5 MM MISC Use four times daily to inject insulin 04/27/18  Yes Kumar, Ajay,Elayne Snaren regular (NOVOLIN R,HUMULIN R) 100 units/mL injection Inject 6-8 Units into the skin See admin instructions. Inject 8 units subcutaneously with breakfast, and 6 units with lunch and supper Sliding scale    Yes [provider]  lactulose (CHRONULAC) 10 GM/15ML solution Take 45 mLs (30 g total) by mouth 3 (three) times daily. Patient taking differently: Take 30-45 g by mouth 3 (three) times daily. 06/14/21  Yes Alaynah Schutter JosInes Bloomerium oxide (MAG-OX) 400 MG tablet Take by mouth. 02/26/22  Yes [provider]  Multiple Vitamin (MULTIVITAMIN WITH MINERALS) TABS tablet Take 1 tablet by mouth daily.   Yes [provider]  mycophenolate (CELLCEPT) 250 MG capsule Take 500 mg by mouth 2 (two) times daily. 02/26/22  Yes [provider]  ondansetron (ZOFRAN ODT) 4 MG disintegrating tablet Take 1 tablet (4 mg total) by mouth 2 (two) times daily as needed for nausea or vomiting. 05/01/20  Yes [name], Hallie C, PABaudetteE TOUCH LANCETS MISC Use to test blood sugar three times daily 04/27/18  Yes Kumar, Ajay,Elayne SnareCH VERMay[facility] Hospitalstrip USE 1 STRIP TO CHECK GLUCOSE THREE TIMES DAILY 07/23/21  Yes Kumar, Ajay,Elayne SnareSONE (DELTASONE) 5 MG tablet Take by mouth. 02/15/22  Yes [provider]  sulfamethoxazole-trimethoprim (BACTRIM DS) 800-160 MG tablet Take by mouth. 10/03/21 09/04/22 Yes [provider]  TRESIBA FLEXTOUCH 100 UNIT/ML FlexTouch Pen INJECT 14 UNITS SUBCUTANEOUSLY ONCE DAILY 12/21/21  Yes Nesanel Aguila Jose,Ines Bloomerm acetate (PHOSLO) 667 MG tablet Take 667 mg by mouth 3 (three) times daily. Patient not taking: Reported on 03/05/2022 05/09/21   [provider]  lidocaine-prilocaine (EMLA) cream Apply 1  application topically See admin instructions. Apply topically prior to dialysis on Tuesday, Thursday, Saturday Patient not taking: Reported on 03/05/2022 08/24/18   [provider]  mupirocin ointment (BACTROBAN) 2 % Apply 1 application topically daily. Patient not taking: Reported on 07/26/2021 05/30/21   Horald Pollen, MD  oxyCODONE-acetaminophen (PERCOCET) 5-325 MG tablet Take 1 tablet by mouth every 6 (six) hours  as needed. Patient not taking: Reported on 03/05/2022 07/27/21   Gabriel Earing, PA-C  rifaximin (XIFAXAN) 550 MG TABS tablet Take 1 tablet (550 mg total) by mouth 2 (two) times daily. Patient not taking: Reported on 03/05/2022 07/07/20   Shawna Clamp, MD  rosuvastatin (CRESTOR) 5 MG tablet Take 1 tablet (5 mg total) by mouth daily. Patient not taking: Reported on 07/26/2021 10/18/20   Elayne Snare, MD  sevelamer carbonate (RENVELA) 800 MG tablet Take 800 mg by mouth 3 (three) times daily. 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   Elevated PSA 12/14/2020   Repeated falls 11/13/2020   Coagulation defect, unspecified ([place]) 11/06/2020   Other microscopic hematuria 11/03/2020   Traumatic amputation of third toe of left foot ([place]) 10/15/2020   Diabetic foot infection ([place]) 10/06/2020   Thrombotic microangiopathy, unspecified ([place]) 05/02/2020   Anuria 04/27/2020   Awaiting organ transplant 04/27/2020   Physical deconditioning 04/27/2020   S/P TIPS (transjugular intrahepatic portosystemic shunt) 04/27/2020   Presbycusis of both ears 09/24/2019   AV fistula thrombosis, initial encounter ([place]) 06/01/2019   AV fistula occlusion, initial encounter ([place])    Chronic systolic CHF (congestive heart failure) (HCC)    Esophageal varices ([place]) 09/23/2018   Thrombocytopenia ([place]) 09/23/2018   CHF (congestive heart failure) ([place]) 09/22/2018   ESRD on dialysis ([place]) 02/06/2018   Mild protein-calorie malnutrition ([place]) 02/04/2018   Anemia in chronic kidney disease 01/22/2018   Iron deficiency anemia, unspecified 01/22/2018   Secondary  hyperparathyroidism of renal origin ([place]) 01/22/2018   CKD (chronic kidney disease) stage 4, GFR 15-29 ml/min ([place]) 12/19/2017   Cirrhosis of liver with ascites ([place]) 12/09/2017   Goodpasture's syndrome ([place]) with associated hemoptysis 11/20/2017   Pancytopenia ([place]) 11/06/2017   Hypertension 11/06/2017   Type II diabetes mellitus with renal manifestations ([place]) 08/25/2015    Past Medical History:  Diagnosis Date   Anemia    Blind one eye    CHF (congestive heart failure) ([place])    Cirrhosis ([place])    ESRD on hemodialysis ([place])    T, Th, Sat. [place]   Goodpasture's disease [facility])    on outpatient plasmapheresis/notes 11/20/2017   Grade I diastolic dysfunction 54/56/2563   History of anemia due to chronic kidney disease    History of blood transfusion X 1   "UGIB; low blood count"   History of plasmapheresis    "qod" (11/20/2017)   Hypertension    Pancytopenia ([place]) 08/20/2017   Type II diabetes mellitus [facility])     Past Surgical History:  Procedure Laterality Date   A/V FISTULAGRAM Left 10/16/2020   Procedure: A/V FISTULAGRAM;  Surgeon: Waynetta Sandy, MD;  Location: [place] [facility];  Service: Cardiovascular;  Laterality: Left;   AMPUTATION TOE Left 10/07/2020   Procedure: AMPUTATION THIRD TOE;  Surgeon: Criselda Peaches, DPM;  Location: [place];  Service: Podiatry;  Laterality: Left;   AV FISTULA PLACEMENT Left 12/26/2017   Procedure: LEFT BRACHIOCEPHALIC ARTERIOVENOUS (AV) FISTULA CREATION;  Surgeon: Conrad [place], MD;  Location: [place];  Service: Vascular;  Laterality: Left;   AV FISTULA PLACEMENT Left 11/02/2020   Procedure: INSERTION OF ARTERIOVENOUS (AV) GORE-TEX GRAFT ARM;  Surgeon: Waynetta Sandy, MD;  Location: [place];  Service: Vascular;  Laterality: Left;   AV FISTULA PLACEMENT Right 06/01/2021   Procedure: RIGHT ARM BRACHIOBASILIC ARTERIOVENOUS (AV) FISTULA CREATION;  Surgeon: Waynetta Sandy, MD;  Location: [place];  Service: Vascular;   Laterality: Right;   BASCILIC VEIN TRANSPOSITION Left 02/16/2018   Procedure: BRACHIOBASILIC VEIN TRANSPOSITION SECOND STAGE;  Surgeon: Conrad , MD;  Location: [place];  Service: Vascular;  Laterality: Left;   [place] Right 07/27/2021   Procedure: SECOND STAGE RIGHT ARM BASILIC VEIN TRANSPOSITION;  Surgeon: Cherre Robins, MD;  Location: [facility];  Service: Vascular;  Laterality: Right;   CATARACT EXTRACTION W/ INTRAOCULAR LENS  IMPLANT, BILATERAL Bilateral    ESOPHAGEAL BANDING N/A 04/01/2018   Procedure: ESOPHAGEAL BANDING;  Surgeon: Otis Brace, MD;  [facility];  Service: Gastroenterology;  Laterality: N/A;   ESOPHAGEAL BANDING N/A 06/29/2018   Procedure: ESOPHAGEAL BANDING;  Surgeon: Otis Brace, MD;  [facility];  Service: Gastroenterology;  Laterality: N/A;   ESOPHAGOGASTRODUODENOSCOPY (EGD) WITH PROPOFOL N/A 04/01/2018   Procedure: ESOPHAGOGASTRODUODENOSCOPY (EGD) WITH PROPOFOL;  Surgeon: Otis Brace, MD;  [facility];  Service: Gastroenterology;  Laterality: N/A;   ESOPHAGOGASTRODUODENOSCOPY (EGD) WITH PROPOFOL N/A 06/29/2018   Procedure: ESOPHAGOGASTRODUODENOSCOPY (EGD) WITH PROPOFOL;  Surgeon: Otis Brace, MD;  [facility];  Service: Gastroenterology;  Laterality: N/A;   ESOPHAGOGASTRODUODENOSCOPY (EGD) WITH PROPOFOL N/A 09/30/2018   Procedure: ESOPHAGOGASTRODUODENOSCOPY (EGD) WITH PROPOFOL;  Surgeon: Otis Brace, MD;  [facility];  Service: Gastroenterology;  Laterality: N/A;   ESOPHAGOGASTRODUODENOSCOPY (EGD) WITH PROPOFOL N/A 04/20/2019   Procedure: ESOPHAGOGASTRODUODENOSCOPY (EGD) WITH PROPOFOL;  Surgeon: Otis Brace, MD;  [facility];  Service: Gastroenterology;  Laterality: N/A;   ESOPHAGOGASTRODUODENOSCOPY (EGD) WITH PROPOFOL N/A 04/17/2020   Procedure: ESOPHAGOGASTRODUODENOSCOPY (EGD) WITH PROPOFOL;  Surgeon: Wonda Horner, MD;  Location: [place];   Service: Gastroenterology;  Laterality: N/A;   I & D EXTREMITY Left 02/18/2018   Procedure: IRRIGATION AND DEBRIDEMENT LEFT ARM;  Surgeon: Serafina Mitchell, MD;  Location: [place];  Service: Vascular;  Laterality: Left;   INSERTION OF DIALYSIS CATHETER N/A 01/19/2018   Procedure: INSERTION OF DIALYSIS CATHETER - RIGHT INTERNAL JUGULAR PLACEMENT;  Surgeon: Rosetta Posner, MD;  Location: [place];  Service: Vascular;  Laterality: N/A;   INSERTION OF DIALYSIS CATHETER Right 11/02/2020   Procedure: INSERTION OF TUNNELED DIALYSIS CATHETER;  Surgeon: Waynetta Sandy, MD;  Location: [place];  Service: Vascular;  Laterality: Right;   IR FLUORO GUIDE CV LINE RIGHT  11/10/2017   IR PARACENTESIS  12/10/2017   IR PARACENTESIS  12/26/2017   IR REMOVAL TUN CV CATH W/O FL  11/28/2017   IR THROMBECTOMY AV FISTULA W/THROMBOLYSIS/PTA INC/SHUNT/IMG LEFT Left 06/02/2019   IR US GUIDE VASC [place] LEFT  06/02/2019   IR US GUIDE VASC ACCESS RIGHT  11/10/2017   NECK SURGERY  2007   "back of neck; no hardware in there"   [place] Left 11/02/2020  Procedure: REVISON OF ARTERIOVENOUS FISTULA;  Surgeon: Waynetta Sandy, MD;  [facility];  Service: Vascular;  Laterality: Left;    Social History   Socioeconomic History   Marital status: Married    Spouse name: Not on file   Number of children: Not on file   Years of education: Not on file   Highest education level: Not on file  Occupational History   Not on file  Tobacco Use   Smoking status: Never   Smokeless tobacco: Never  Vaping Use   Vaping Use: Never used  Substance and Sexual Activity   Alcohol use: Never   Drug use: Never   Sexual activity: Not Currently  Other Topics Concern   Not on file  Social History Narrative   Not on file   Social Determinants of Health   Financial Resource Strain: Not on file  Food Insecurity: Not on file  Transportation Needs: Not on file  Physical Activity: Not on file  Stress: Not  on file  Social Connections: Not on file  Intimate Partner Violence: Not on file    Family History  Problem Relation Age of Onset   Diabetes Mellitus II Sister    Diabetes Sister    Stroke Brother    Heart attack Brother      Review of Systems  Constitutional: Negative.  Negative for chills and fever.  HENT: Negative.  Negative for congestion and sore throat.   Respiratory: Negative.  Negative for cough and shortness of breath.   Cardiovascular: Negative.  Negative for chest pain and palpitations.  Gastrointestinal:  Negative for abdominal pain, nausea and vomiting.  Genitourinary: Negative.  Negative for dysuria and hematuria.  Skin: Negative.  Negative for rash.  Neurological:  Negative for dizziness and headaches.  All other systems reviewed and are negative.   Today's Vitals   03/05/22 1524  BP: 122/64  Pulse: 79  Temp: 97.7 F (36.5 C)  TempSrc: Oral  SpO2: 98%  Weight: 144 lb 4 oz (65.4 kg)  Height: 5' 5"  (1.651 m)   Body mass index is 24 kg/m. Wt Readings from Last 3 Encounters:  03/05/22 144 lb 4 oz (65.4 kg)  07/27/21 138 lb 14.2 oz (63 kg)  07/11/21 137 lb (62.1 kg)    Physical Exam Vitals reviewed.  Constitutional:      Appearance: Normal appearance.  HENT:     Head: Normocephalic.  Eyes:     Extraocular Movements: Extraocular movements intact.     Pupils: Pupils are equal, round, and reactive to light.  Cardiovascular:     Rate and Rhythm: Normal rate and regular rhythm.     Pulses: Normal pulses.     Heart sounds: Normal heart sounds.  Pulmonary:     Effort: Pulmonary effort is normal.     Breath sounds: Normal breath sounds.  Abdominal:     Palpations: Abdomen is soft.     Tenderness: There is no abdominal tenderness.  Musculoskeletal:     Cervical back: No tenderness.  Lymphadenopathy:     Cervical: No cervical adenopathy.  Skin:    General: Skin is warm and dry.     Capillary Refill: Capillary refill takes less than 2 seconds.   Neurological:     General: No focal deficit present.     Mental Status: He is alert and oriented to person, place, and time.  Psychiatric:        Mood and Affect: Mood normal.  Behavior: Behavior normal.    Results for orders placed or performed in visit on 03/05/22 (from the past 24 hour(s))  POCT HgB A1C     Status: Abnormal   Collection Time: 03/05/22  3:43 PM  Result Value Ref Range   Hemoglobin A1C 9.3 (A) 4.0 - 5.6 %   HbA1c POC (<> result, manual entry)     HbA1c, POC (prediabetic range)     HbA1c, POC (controlled diabetic range)       ASSESSMENT & PLAN: A total of 49 minutes was spent with the patient and counseling/coordination of care regarding preparing for this visit, review of most recent office visit notes, review of most recent specialists office visit notes, review of multiple chronic medical problems and their management, review of all medications, review of most recent blood work results including today's hemoglobin A1c, education on nutrition, prognosis, documentation, and need for follow-up.  Problem List Items Addressed This Visit       Cardiovascular and Mediastinum   Chronic systolic CHF (congestive heart failure) (HCC)    Stable.  No signs of congestive heart failure. Clinically normovolemic.        Digestive   Cirrhosis of liver with ascites (HCC) (Chronic)    Stable.  No signs of hepatic encephalopathy. No signs of peritonitis.        Endocrine   Type II diabetes mellitus with renal manifestations (HCC) - Primary (Chronic)    Elevated hemoglobin A1c. Recently advised by endocrinologist at [facility] to decrease amount of bedtime insulin due to low sugar levels in the morning.      Relevant Orders   POCT HgB A1C (Completed)     Genitourinary   ESRD on dialysis ([place]) (Chronic)    Stable.  Managed by [facility].      Other Visit Diagnoses     History of dental problems       Relevant Orders   Ambulatory referral to  Dentistry      Patient Instructions  Diabetes Mellitus and Nutrition, Adult When you have diabetes, or diabetes mellitus, it is very important to have healthy eating habits because your blood sugar (glucose) levels are greatly affected by what you eat and drink. Eating healthy foods in the right amounts, at about the same times every day, can help you: Manage your blood glucose. Lower your risk of heart disease. Improve your blood pressure. Reach or maintain a healthy weight. What can affect my meal plan? Every person with diabetes is different, and each person has different needs for a meal plan. Your health care provider may recommend that you work with a dietitian to make a meal plan that is best for you. Your meal plan may vary depending on factors such as: The calories you need. The medicines you take. Your weight. Your blood glucose, blood pressure, and cholesterol levels. Your activity level. Other health conditions you have, such as heart or kidney disease. How do carbohydrates affect me? Carbohydrates, also called carbs, affect your blood glucose level more than any other type of food. Eating carbs raises the amount of glucose in your blood. It is important to know how many carbs you can safely have in each meal. This is different for every person. Your dietitian can help you calculate how many carbs you should have at each meal and for each snack. How does alcohol affect me? Alcohol can cause a decrease in blood glucose (hypoglycemia), especially if you use insulin or take certain diabetes medicines  by mouth. Hypoglycemia can be a life-threatening condition. Symptoms of hypoglycemia, such as sleepiness, dizziness, and confusion, are similar to symptoms of having too much alcohol. Do not drink alcohol if: Your health care provider tells you not to drink. You are pregnant, may be pregnant, or are planning to become pregnant. If you drink alcohol: Limit how much you have to: 0-1  drink a day for women. 0-2 drinks a day for men. Know how much alcohol is in your drink. In the [place] one drink equals one 12 oz bottle of beer (355 mL), one 5 oz glass of wine (148 mL), or one 1 oz glass of hard liquor (44 mL). Keep yourself hydrated with water, diet soda, or unsweetened iced tea. Keep in mind that regular soda, juice, and other mixers may contain a lot of sugar and must be counted as carbs. What are tips for following this plan?  Reading food labels Start by checking the serving size on the Nutrition Facts label of packaged foods and drinks. The number of calories and the amount of carbs, fats, and other nutrients listed on the label are based on one serving of the item. Many items contain more than one serving per package. Check the total grams (g) of carbs in one serving. Check the number of grams of saturated fats and trans fats in one serving. Choose foods that have a low amount or none of these fats. Check the number of milligrams (mg) of salt (sodium) in one serving. Most people should limit total sodium intake to less than 2,300 mg per day. Always check the nutrition information of foods labeled as "low-fat" or "nonfat." These foods may be higher in added sugar or refined carbs and should be avoided. Talk to your dietitian to identify your daily goals for nutrients listed on the label. Shopping Avoid buying canned, pre-made, or processed foods. These foods tend to be high in fat, sodium, and added sugar. Shop around the outside edge of the grocery store. This is where you will most often find fresh fruits and vegetables, bulk grains, fresh meats, and fresh dairy products. Cooking Use low-heat cooking methods, such as baking, instead of high-heat cooking methods, such as deep frying. Cook using healthy oils, such as olive, canola, or sunflower oil. Avoid cooking with butter, cream, or high-fat meats. Meal planning Eat meals and snacks regularly, preferably at the same  times every day. Avoid going long periods of time without eating. Eat foods that are high in fiber, such as fresh fruits, vegetables, beans, and whole grains. Eat 4-6 oz (112-168 g) of lean protein each day, such as lean meat, chicken, fish, eggs, or tofu. One ounce (oz) (28 g) of lean protein is equal to: 1 oz (28 g) of meat, chicken, or fish. 1 egg.  cup (62 g) of tofu. Eat some foods each day that contain healthy fats, such as avocado, nuts, seeds, and fish. What foods should I eat? Fruits Berries. Apples. Oranges. Peaches. Apricots. Plums. Grapes. Mangoes. Papayas. Pomegranates. Kiwi. Cherries. Vegetables Leafy greens, including lettuce, spinach, kale, chard, collard greens, mustard greens, and cabbage. Beets. Cauliflower. Broccoli. Carrots. Green beans. Tomatoes. Peppers. Onions. Cucumbers. Brussels sprouts. Grains Whole grains, such as whole-wheat or whole-grain bread, crackers, tortillas, cereal, and pasta. Unsweetened oatmeal. Quinoa. Brown or wild rice. Meats and other proteins Seafood. Poultry without skin. Lean cuts of poultry and beef. Tofu. Nuts. Seeds. Dairy Low-fat or fat-free dairy products such as milk, yogurt, and cheese. The items listed above may not  be a complete list of foods and beverages you can eat and drink. Contact a dietitian for more information. What foods should I avoid? Fruits Fruits canned with syrup. Vegetables Canned vegetables. Frozen vegetables with butter or cream sauce. Grains Refined white flour and flour products such as bread, pasta, snack foods, and cereals. Avoid all processed foods. Meats and other proteins Fatty cuts of meat. Poultry with skin. Breaded or fried meats. Processed meat. Avoid saturated fats. Dairy Full-fat yogurt, cheese, or milk. Beverages Sweetened drinks, such as soda or iced tea. The items listed above may not be a complete list of foods and beverages you should avoid. Contact a dietitian for more information. Questions  to ask a health care provider Do I need to meet with a certified diabetes care and education specialist? Do I need to meet with a dietitian? What number can I call if I have questions? When are the best times to check my blood glucose? Where to find more information: American Diabetes Association: diabetes.org Academy of Nutrition and Dietetics: eatright.[employer] of Diabetes and Digestive and Kidney Diseases: [employer]en[employer].is Association of Diabetes Care & Education Specialists: diabeteseducator.org Summary It is important to have healthy eating habits because your blood sugar (glucose) levels are greatly affected by what you eat and drink. It is important to use alcohol carefully. A healthy meal plan will help you manage your blood glucose and lower your risk of heart disease. Your health care provider may recommend that you work with a dietitian to make a meal plan that is best for you. This information is not intended to replace advice given to you by your health care provider. Make sure you discuss any questions you have with your health care provider. Document Revised: 02/23/2020 Document Reviewed: 02/23/2020 Elsevier Patient Education  [place], MD [facility]

## 2022-03-05 NOTE — Patient Instructions (Signed)

## 2022-03-05 NOTE — Assessment & Plan Note (Signed)
Stable.  No signs of hepatic encephalopathy. No signs of peritonitis.

## 2022-03-05 NOTE — Assessment & Plan Note (Addendum)
Stable.  Managed by [facility]

## 2022-03-05 NOTE — Assessment & Plan Note (Signed)
Stable.  No signs of congestive heart failure. Clinically normovolemic.

## 2022-04-25 ENCOUNTER — Telehealth: Payer: Self-pay

## 2022-04-25 NOTE — Patient Outreach (Signed)
  Care Coordination   Initial Visit Note   04/25/2022 Name: William [name] MRN: [id number] DOB: May 24, 1951  William [name] Auxier is a 71 y.o. year old male who sees Sagardia, William Bloomer, MD for primary care. I spoke with  William [name] Aydelott by phone today.  What matters to the patients health and wellness today?  Spoke with patient's son(dpr). He reports patient had a kidney Transplant 08/10/22 and is being followed by [facility] providers. Denies any issues or concerns regarding this. He also reports patient is followed by [facility] for endocrinology. Patient's son questioned if patient could be prescribed humulin R pen and if insurance would cover. Currently has Humilin R, but in vial and he reports he is drawing up insulin via vial. Reports primary concern is need for dental provider.    Goals Addressed             This Visit's Progress    community resource need       Care Coordination Interventions: Encouraged to contact payor source to find out if patient has a dental benefit with his plan and if so request a list of in- network dental providers Encouraged patient/son to follow up with primary care provider regarding request for dental referral from Primary Care Provider  Encouraged to follow up with Endocrinologist regarding medication questions and as needed Discussed plan with son for ongoing care coordination follow up and provided direct contact information for care coordinator.          SDOH assessments and interventions completed:  Yes  SDOH Interventions Today    Flowsheet Row Most Recent Value  SDOH Interventions   Food Insecurity Interventions Intervention Not Indicated  Housing Interventions Intervention Not Indicated  Transportation Interventions Intervention Not Indicated  Utilities Interventions Intervention Not Indicated       Care Coordination Interventions Activated:  Yes  Care Coordination Interventions:  Yes, provided   Follow up plan: Follow up call scheduled for  05/15/22    Encounter Outcome:  Pt. Visit Completed   Thea Silversmith, RN, MSN, BSN, [place] Coordinator [phone]

## 2022-05-20 ENCOUNTER — Telehealth: Payer: Self-pay | Admitting: *Deleted

## 2022-05-20 NOTE — Chronic Care Management (AMB) (Signed)
  Care Coordination Note  05/20/2022 Name: [name] MRN: [id number] DOB: 10/17/1950  [name] is a 71 y.o. year old male who is a primary care patient of Horald Pollen, MD and is actively engaged with the care management team. I reached out to Joselyn Glassman by phone today to assist with re-scheduling a follow up visit with the RN Case Manager  Follow up plan: Unsuccessful telephone outreach attempt made. A HIPAA compliant phone message was left for the patient providing contact information and requesting a return call.   Julian Hy, [place] Direct Dial: [phone]

## 2022-05-28 ENCOUNTER — Telehealth: Payer: Self-pay | Admitting: Emergency Medicine

## 2022-05-28 NOTE — Telephone Encounter (Signed)
Left message for patient to call back to schedule Medicare Annual Wellness Visit   No hx of AWV eligible as of 06/05/18  Please schedule at anytime with [facility] Advisor if patient calls the office back.     Any questions, please call me at [phone]

## 2022-05-30 ENCOUNTER — Encounter (HOSPITAL_COMMUNITY): Payer: Self-pay | Admitting: Emergency Medicine

## 2022-05-30 ENCOUNTER — Other Ambulatory Visit: Payer: Self-pay

## 2022-05-30 ENCOUNTER — Emergency Department (HOSPITAL_COMMUNITY)
Admission: EM | Admit: 2022-05-30 | Discharge: 2022-05-30 | Disposition: A | Discharge status: Home / Self Care | Payer: Medicare (Managed Care) | Attending: Emergency Medicine | Admitting: Emergency Medicine

## 2022-05-30 ENCOUNTER — Emergency Department (HOSPITAL_COMMUNITY): Payer: Medicare (Managed Care)

## 2022-05-30 DIAGNOSIS — I509 Heart failure, unspecified: Secondary | ICD-10-CM | POA: Insufficient documentation

## 2022-05-30 DIAGNOSIS — Z794 Long term (current) use of insulin: Secondary | ICD-10-CM | POA: Insufficient documentation

## 2022-05-30 DIAGNOSIS — Z992 Dependence on renal dialysis: Secondary | ICD-10-CM | POA: Insufficient documentation

## 2022-05-30 DIAGNOSIS — R519 Headache, unspecified: Secondary | ICD-10-CM | POA: Insufficient documentation

## 2022-05-30 DIAGNOSIS — E1122 Type 2 diabetes mellitus with diabetic chronic kidney disease: Secondary | ICD-10-CM | POA: Insufficient documentation

## 2022-05-30 DIAGNOSIS — E875 Hyperkalemia: Secondary | ICD-10-CM | POA: Diagnosis not present

## 2022-05-30 DIAGNOSIS — I132 Hypertensive heart and chronic kidney disease with heart failure and with stage 5 chronic kidney disease, or end stage renal disease: Secondary | ICD-10-CM | POA: Insufficient documentation

## 2022-05-30 DIAGNOSIS — N186 End stage renal disease: Secondary | ICD-10-CM | POA: Insufficient documentation

## 2022-05-30 LAB — CBC WITH DIFFERENTIAL/PLATELET
Abs Immature Granulocytes: 0.02 10*3/uL (ref 0.00–0.07)
Basophils Absolute: 0 10*3/uL (ref 0.0–0.1)
Basophils Relative: 1 %
Eosinophils Absolute: 0 10*3/uL (ref 0.0–0.5)
Eosinophils Relative: 1 %
HCT: 43.4 % (ref 39.0–52.0)
Hemoglobin: 13.7 g/dL (ref 13.0–17.0)
Immature Granulocytes: 1 %
Lymphocytes Relative: 41 %
Lymphs Abs: 1.5 10*3/uL (ref 0.7–4.0)
MCH: 25.2 pg — ABNORMAL LOW (ref 26.0–34.0)
MCHC: 31.6 g/dL (ref 30.0–36.0)
MCV: 79.9 fL — ABNORMAL LOW (ref 80.0–100.0)
Monocytes Absolute: 0.4 10*3/uL (ref 0.1–1.0)
Monocytes Relative: 11 %
Neutro Abs: 1.6 10*3/uL — ABNORMAL LOW (ref 1.7–7.7)
Neutrophils Relative %: 45 %
Platelets: 82 10*3/uL — ABNORMAL LOW (ref 150–400)
RBC: 5.43 MIL/uL (ref 4.22–5.81)
RDW: 13.7 % (ref 11.5–15.5)
WBC: 3.5 10*3/uL — ABNORMAL LOW (ref 4.0–10.5)
nRBC: 0 % (ref 0.0–0.2)

## 2022-05-30 LAB — BASIC METABOLIC PANEL
Anion gap: 8 (ref 5–15)
Anion gap: 7 (ref 5–15)
BUN: 30 mg/dL — ABNORMAL HIGH (ref 8–23)
BUN: 29 mg/dL — ABNORMAL HIGH (ref 8–23)
CO2: 23 mmol/L (ref 22–32)
CO2: 25 mmol/L (ref 22–32)
Calcium: 8.9 mg/dL (ref 8.9–10.3)
Calcium: 8.8 mg/dL — ABNORMAL LOW (ref 8.9–10.3)
Chloride: 101 mmol/L (ref 98–111)
Chloride: 99 mmol/L (ref 98–111)
Creatinine, Ser: 1.01 mg/dL (ref 0.61–1.24)
Creatinine, Ser: 1.11 mg/dL (ref 0.61–1.24)
GFR, Estimated: >60 mL/min (ref >60)
GFR, Estimated: >60 mL/min (ref >60)
Glucose, Bld: 278 mg/dL — ABNORMAL HIGH (ref 70–99)
Glucose, Bld: 288 mg/dL — ABNORMAL HIGH (ref 70–99)
Potassium: 5.9 mmol/L — ABNORMAL HIGH (ref 3.5–5.1)
Potassium: 5.4 mmol/L — ABNORMAL HIGH (ref 3.5–5.1)
Sodium: 132 mmol/L — ABNORMAL LOW (ref 135–145)
Sodium: 131 mmol/L — ABNORMAL LOW (ref 135–145)

## 2022-05-30 MED ORDER — AMOXICILLIN-POT CLAVULANATE 875-125 MG PO TABS: 1.0000 | ORAL_TABLET | Freq: Two times a day (BID) | ORAL | 0 refills | Status: DC

## 2022-05-30 MED ORDER — SODIUM ZIRCONIUM CYCLOSILICATE 5 G PO PACK
5.0000 g | PACK | Freq: Once | ORAL | Status: AC
  Administered 2022-05-30: 5 g via ORAL
  Filled 2022-05-30: qty 1

## 2022-05-30 MED ORDER — SODIUM ZIRCONIUM CYCLOSILICATE 5 G PO PACK: 5.0000 g | PACK | Freq: Once | ORAL | Status: DC

## 2022-05-30 NOTE — Discharge Instructions (Addendum)
You have been evaluated for your headache. Your sinus is a bit swollen.  Sometimes it can cause headache.  If you have fever, worsening headache, increase congestion then consider taking augmentin for further care.  Discuss this with your transplant team first before starting the medication. Take tylenol as needed for headache. Your potassium level is elevated today.  Please have it recheck in 1 week.

## 2022-05-30 NOTE — ED Provider Notes (Signed)
[facility] DEPT Provider Note   CSN: [id number] Arrival date & time: 05/30/22  0530     History  Chief Complaint  Patient presents with   Headache    William [name] is a 71 y.o. male.  The history is provided by the patient, medical records and a relative. The history is limited by a language barrier. A language interpreter was used.  Headache    71 year old male significant history of hypertension, diabetes, CHF, cirrhosis, end-stage renal disease currently on hemodialysis presenting with complaints of headache.  History obtained through son who is at bedside who also serves as a language interpreter per patient request.  Patient developing bitemporal throbbing headache that started yesterday which resolved after taking over-the-counter Tylenol and some energy pill.  He woke up this morning with the same headache again.  Son gave him Tylenol and brought him here for further assessment.  After taking the Tylenol at this time he reported feeling better.  There are no associated fever or chills no runny nose sneezing or coughing no neck pain no chest pain or shortness of breath no abdominal pain no trouble urinating no focal numbness or focal weakness or rash.  He has had prior liver and kidney transplant in January of this year.  No recent change in medication.  No recent sick contact.  Patient without any other complaint.  His headache is minimal at this time.  No light or sound sensitivity.  Headache was waxing and waning nothing seems to make it worse.  Home Medications Prior to Admission medications   Medication Sig Start Date End Date Taking? Authorizing Provider  acetaminophen (TYLENOL) 650 MG CR tablet Take 650 mg by mouth every 8 (eight) hours as needed for pain.    [provider]  calcium acetate (PHOSLO) 667 MG tablet Take 667 mg by mouth 3 (three) times daily. Patient not taking: Reported on 03/05/2022 05/09/21   [provider]   Calcium Citrate-Vitamin D 315-5 MG-MCG TABS Take by mouth. 10/03/21   [provider]  cycloSPORINE modified (NEORAL) 100 MG capsule Take 147m (one of the 1052mcapsules along with one of the 2556mapsules) in the morning AND Take 125 mg (one of the 100m49mpsules along with one of the 25mg51msules) in the evening, by mouth. 01/17/22   [provider]  cycloSPORINE modified (NEORAL) 25 MG capsule Take 125mg 41m of the 100mg c41mles along with one of the 25mg ca79mes) in the morning AND Take 125mg (on7m the 100mg caps52m along with one of the 25mg capsu76m in the evening, by mouth. 01/17/22   [provider]  GENGRAF 100 MG capsule Take 100 mg by mouth 2 (two) times daily. 02/26/22   [provider]  GENGRAF 25 MG capsule Take by mouth. 02/26/22   [provider]  Insulin Pen Needle (ADVOCATE INSULIN PEN NEEDLES) 31G X 5 MM MISC Use four times daily to inject insulin 04/27/18   Kumar, AjayElayne Snarein regular (NOVOLIN R,HUMULIN R) 100 units/mL injection Inject 6-8 Units into the skin See admin instructions. Inject 8 units subcutaneously with breakfast, and 6 units with lunch and supper Sliding scale    [provider]  lactulose (CHRONULAC) 10 GM/15ML solution Take 45 mLs (30 g total) by mouth 3 (three) times daily. Patient not taking: Reported on 04/25/2022 06/14/21   Sagardia, MHorald Pollenaine-prilocaine (EMLA) cream Apply 1 application topically See admin instructions. Apply topically prior to dialysis on  Tuesday, Thursday, Saturday Patient not taking: Reported on 04/25/2022 08/24/18   [provider]  magnesium oxide (MAG-OX) 400 MG tablet Take by mouth. 02/26/22   [provider]  Multiple Vitamin (MULTIVITAMIN WITH MINERALS) TABS tablet Take 1 tablet by mouth daily.    [provider]  mupirocin ointment (BACTROBAN) 2 % Apply 1 application topically daily. Patient not taking: Reported on 07/26/2021  05/30/21   Horald Pollen, MD  mycophenolate (CELLCEPT) 250 MG capsule Take 500 mg by mouth 2 (two) times daily. 02/26/22   [provider]  ondansetron (ZOFRAN ODT) 4 MG disintegrating tablet Take 1 tablet (4 mg total) by mouth 2 (two) times daily as needed for nausea or vomiting. 05/01/20   [name], Madelynn Done C, PA-C  ONE TOUCH LANCETS MISC Use to test blood sugar three times daily 04/27/18   Elayne Snare, MD  [facility] VERIO test strip USE 1 STRIP TO CHECK GLUCOSE THREE TIMES DAILY 07/23/21   Elayne Snare, MD  oxyCODONE-acetaminophen (PERCOCET) 5-325 MG tablet Take 1 tablet by mouth every 6 (six) hours as needed. 07/27/21   Rhyne, Hulen Shouts, PA-C  predniSONE (DELTASONE) 5 MG tablet Take by mouth. 02/15/22   [provider]  rifaximin (XIFAXAN) 550 MG TABS tablet Take 1 tablet (550 mg total) by mouth 2 (two) times daily. Patient not taking: Reported on 03/05/2022 07/07/20   Shawna Clamp, MD  rosuvastatin (CRESTOR) 5 MG tablet Take 1 tablet (5 mg total) by mouth daily. Patient not taking: Reported on 07/26/2021 10/18/20   Elayne Snare, MD  sevelamer carbonate (RENVELA) 800 MG tablet Take 800 mg by mouth 3 (three) times daily. Patient not taking: Reported on 03/05/2022 09/25/20   [provider]  sulfamethoxazole-trimethoprim (BACTRIM DS) 800-160 MG tablet Take by mouth. 10/03/21 09/04/22  [provider]  TRESIBA FLEXTOUCH 100 UNIT/ML FlexTouch Pen INJECT 14 UNITS SUBCUTANEOUSLY ONCE DAILY 12/21/21   Horald Pollen, MD      Allergies    Heparin and Pork-derived products    Review of Systems   Review of Systems  Neurological:  Positive for headaches.  All other systems reviewed and are negative.   Physical Exam Updated Vital Signs BP 130/63   Pulse 81   Temp 97.8 F (36.6 C) (Oral)   Resp 16   Ht 5' 5"  (1.651 m)   Wt 64.4 kg   SpO2 99%   BMI 23.63 kg/m  Physical Exam Vitals and nursing note reviewed.  Constitutional:      General: He is not in  acute distress.    Appearance: He is well-developed.  HENT:     Head: Atraumatic.  Eyes:     Extraocular Movements: Extraocular movements intact.     Conjunctiva/sclera: Conjunctivae normal.     Pupils: Pupils are equal, round, and reactive to light.  Neck:     Meningeal: Brudzinski's sign and Kernig's sign absent.  Cardiovascular:     Rate and Rhythm: Normal rate and regular rhythm.  Musculoskeletal:     Cervical back: Normal range of motion and neck supple. No rigidity.  Skin:    Findings: No rash.  Neurological:     Mental Status: He is alert.     GCS: GCS eye subscore is 4. GCS verbal subscore is 5. GCS motor subscore is 6.     Cranial Nerves: No cranial nerve deficit, dysarthria or facial asymmetry.     Sensory: No sensory deficit.     Motor: No weakness.     Coordination:  Romberg sign negative. Coordination normal.     Gait: Gait normal.     ED Results / Procedures / Treatments   Labs (all labs ordered are listed, but only abnormal results are displayed) Labs Reviewed  CBC WITH DIFFERENTIAL/PLATELET - Abnormal; Notable for the following components:      Result Value   WBC 3.5 (*)    MCV 79.9 (*)    MCH 25.2 (*)    Platelets 82 (*)    Neutro Abs 1.6 (*)    All other components within normal limits  BASIC METABOLIC PANEL - Abnormal; Notable for the following components:   Sodium 132 (*)    Potassium 5.9 (*)    Glucose, Bld 278 (*)    BUN 30 (*)    All other components within normal limits  BASIC METABOLIC PANEL - Abnormal; Notable for the following components:   Sodium 131 (*)    Potassium 5.4 (*)    Glucose, Bld 288 (*)    BUN 29 (*)    Calcium 8.8 (*)    All other components within normal limits    EKG None  Radiology CT Head Wo Contrast  Result Date: 05/30/2022 CLINICAL DATA:  Headache in a transplant patient. EXAM: CT HEAD WITHOUT CONTRAST TECHNIQUE: Contiguous axial images were obtained from the base of the skull through the vertex without  intravenous contrast. RADIATION DOSE REDUCTION: This exam was performed according to the departmental dose-optimization program which includes automated exposure control, adjustment of the mA and/or kV according to patient size and/or use of iterative reconstruction technique. COMPARISON:  11/13/20 CT head FINDINGS: Brain: No evidence of acute infarction, hemorrhage, hydrocephalus, extra-axial collection or mass lesion/mass effect. Vascular: No hyperdense vessel or unexpected calcification. Skull: Normal. Negative for fracture or focal lesion. Sinuses/Orbits: Mild right maxillary sinus mucosal thickening. Bilateral lens replacements. Other: None. IMPRESSION: 1. No acute intracranial abnormality. 2. Mild right maxillary sinus mucosal thickening. Electronically Signed   By: Marin Roberts M.D.   On: 05/30/2022 09:50    Procedures Procedures    Medications Ordered in ED Medications - No data to display  ED Course/ Medical Decision Making/ A&P                           Medical Decision Making Amount and/or Complexity of Data Reviewed Labs: ordered. Radiology: ordered.  Risk Prescription drug management.   BP 130/63   Pulse 81   Temp 97.8 F (36.6 C) (Oral)   Resp 16   Ht 5' 5"  (1.651 m)   Wt 64.4 kg   SpO2 99%   BMI 23.63 kg/m   10:88 AM  71 year old male significant history of hypertension, diabetes, CHF, cirrhosis, end-stage renal disease currently on hemodialysis presenting with complaints of headache.  History obtained through son who is at bedside who also serves as a language interpreter per patient request.  Patient developing bitemporal throbbing headache that started yesterday which resolved after taking over-the-counter Tylenol and some energy pill.  He woke up this morning with the same headache again.  Son gave him Tylenol and brought him here for further assessment.  After taking the Tylenol at this time he reported feeling better.  There are no associated fever or chills no  runny nose sneezing or coughing no neck pain no chest pain or shortness of breath no abdominal pain no trouble urinating no focal numbness or focal weakness or rash.  He has had prior liver and kidney transplant  in January of this year.  No recent change in medication.  No recent sick contact.  Patient without any other complaint.  His headache is minimal at this time.  No light or sound sensitivity.  Headache was waxing and waning nothing seems to make it worse.  On exam this is a well-appearing elderly male resting comfortably in bed appears to be in no acute discomfort.  He is answering question appropriately.  He does not have any focal neurodeficit on exam.  He does not have any reproducible tenderness to his scalp on his forehead.  He is afebrile with stable normal vital sign.  He does not have any infectious symptoms.  Low suspicion for meningitis.  No acute onset thunderclap headache concerning for subarachnoid hemorrhage, no focal neurodeficit concerning for stroke loss space-occupying lesion.  Given his comorbidity, will check basic labs, and obtain head CT scan.  Patient did have Tylenol earlier in the day with improvement of symptoms.  We will hold off on any additional treatment.  10:42 AM Labs and imaging obtained independently viewed and interpreted by me and I agree with radiologist interpretation.  Lab initially shows a potassium of 5.9 which could be due to hemolysis.  A repeat potassium is 5.4 therefore patient was given 5 g of Lokelma.  CT scan of the head shows no acute intracranial abnormalities.  Mild right maxillary sinus mucosal thickening was noted.  Patient's headache may be due to sinus cause.  I discussed this with patient and with his son.  We will prescribe Augmentin but encourage patient to only take it if he develop worsening headache fever worsening congestion.  He should also notify his transplant team for clearance of the antibiotic in the event that he needs it.   Otherwise Tylenol at home as needed.  Hospital admission consider but in the setting of improving headache, no concerning finding on his labs and imaging patient is stable to be discharged.  Care discussed with Dr. Gilford Raid who agrees.         Final Clinical Impression(s) / ED Diagnoses Final diagnoses:  Sinus headache  Hyperkalemia    Rx / DC Orders ED Discharge Orders          Ordered    amoxicillin-clavulanate (AUGMENTIN) 875-125 MG tablet  Every 12 hours        05/30/22 1047              Domenic Moras, PA-C 05/30/22 1049    Isla Pence, MD 05/30/22 1202

## 2022-05-30 NOTE — ED Notes (Signed)
Pt son at bedside translating. Per pt's son, pt has had nausea, runny nose, headache since yesterday. Son reports sick contacts at home.

## 2022-05-30 NOTE — ED Triage Notes (Signed)
Pt c/o headache since yesterday. Pt took tylenol yesterday and headache was relieved. Woke up this morning with a headache without relief from tylenol. Pt had liver and kidney transplant 08/2021

## 2022-05-30 NOTE — ED Notes (Signed)
AVS with prescriptions provided to and discussed with patient and family member at bedside. Pt verbalizes understanding of discharge instructions and denies any questions or concerns at this time. Pt has ride home. Pt ambulated out of department independently with steady gait.  

## 2022-06-03 ENCOUNTER — Telehealth: Payer: Self-pay

## 2022-06-03 NOTE — Telephone Encounter (Signed)
Transition Care Management Unsuccessful Follow-up Telephone Call  Date of discharge and from where:  05/30/22  Attempts:  1st Attempt  Reason for unsuccessful TCM follow-up call:  Left voice message and pt is traveling based on chart since 05/30/22

## 2022-06-06 NOTE — Telephone Encounter (Signed)
Transition Care Management Unsuccessful Follow-up Telephone Call  Date of discharge and from where:  05/30/22 @[place]  [facility]   Attempts:  2nd Attempt  Reason for unsuccessful TCM follow-up call:  Left voice message

## 2022-06-10 NOTE — Progress Notes (Signed)
  Care Coordination Note  06/10/2022 Name: William [name] MRN: [id number] DOB: 01-05-1951  William [name] is a 71 y.o. year old male who is a primary care patient of Horald Pollen, MD and is actively engaged with the care management team. I reached out to Joselyn Glassman by phone today to assist with re-scheduling a follow up visit with the RN Case Manager  Follow up plan: Telephone appointment with care management team member scheduled for: 06/11/2022  Julian Hy, [place] Direct Dial: [phone]

## 2022-06-11 ENCOUNTER — Ambulatory Visit: Payer: Self-pay

## 2022-06-11 NOTE — Patient Outreach (Signed)
  Care Coordination   Follow Up Visit Note   06/11/2022 Name: William [name] MRN: [id number] DOB: [birth year]  William [name] is a 71 y.o. year old male who sees Sagardia, Ines Bloomer, MD for primary care. I spoke with [facility] (son/dpr) by phone today.  What matters to the patients health and wellness today?  Per patient's son, patient has received dental resources and denies any additional resource needs. He denies any additional care coordination needs.   Goals Addressed             This Visit's Progress    COMPLETED: community resource need       Care Coordination Interventions: Confirmed with Patient's son that he has received the dental resources and denies any additional needs           SDOH assessments and interventions completed:  No  Care Coordination Interventions Activated:  Yes  Care Coordination Interventions:  Yes, provided   Follow up plan: No further intervention required.   Encounter Outcome:  Pt. Visit Completed   Thea Silversmith, RN, MSN, BSN, [place] Coordinator [phone]

## 2022-06-11 NOTE — Patient Instructions (Signed)
Visit Information  Thank you for taking time to visit with me today. Please don't hesitate to contact me if I can be of assistance to you.   Following are the goals we discussed today:   Goals Addressed             This Visit's Progress    COMPLETED: community resource need       Care Coordination Interventions: Confirmed with Patient's son that he has received the dental resources and denies any additional needs           If you are experiencing a Mental Health or [place] or need someone to talk to, please call the Suicide and Crisis Lifeline: 988  The patient verbalized understanding of instructions, educational materials, and care plan provided today and DECLINED offer to receive copy of patient instructions, educational materials, and care plan.   Thea Silversmith, RN, MSN, BSN, [place] Care Management Coordinator [phone]

## 2022-06-12 NOTE — Telephone Encounter (Signed)
Transition Care Management Unsuccessful Follow-up Telephone Call  Date of discharge and from where:  05/30/22 @[place]  [facility]   Attempts:  3rd Attempt  Reason for unsuccessful TCM follow-up call:  Voice mail full

## 2022-06-12 NOTE — Telephone Encounter (Signed)
3rd attempt to schedule pt for follow up visit with provider unsuccessfully, letter written and send

## 2022-06-12 NOTE — Progress Notes (Signed)
3rd attempt to schedule pt for follow up visit with provider unsuccessfully, letter written and send

## 2022-09-05 ENCOUNTER — Ambulatory Visit: Payer: Medicare (Managed Care) | Admitting: Emergency Medicine

## 2022-09-19 ENCOUNTER — Encounter (HOSPITAL_COMMUNITY): Payer: Self-pay

## 2022-09-19 ENCOUNTER — Telehealth: Payer: Self-pay | Admitting: Podiatry

## 2022-09-19 NOTE — Telephone Encounter (Signed)
Received call to get pt scheduled and I explained that ht pts new insurance plan is out of network with our office. He is to call his insurance to see who is in network with his insurance.

## 2022-09-24 ENCOUNTER — Telehealth: Payer: Self-pay

## 2022-09-24 NOTE — Patient Outreach (Signed)
  Care Coordination   Follow Up Visit Note   09/24/2022 Name: William [name] MRN: [id number] DOB: Dec 25, 1950  William [name] is a 72 y.o. year old male who sees Sagardia, Ines Bloomer, MD for primary care. I spoke with patient's son/dpr [facility] by phone today.  What matters to the patients health and wellness today?  Per Patient's son, "He is good". He denies any additional care coordination needs at this time.    Goals Addressed             This Visit's Progress    COMPLETED: Care Coordination Activities       Interventions Today    Flowsheet Row Most Recent Value  Chronic Disease   Chronic disease during today's visit Other  [kidney replaced by transplant/liver replaced by transplant]  General Interventions   General Interventions Discussed/Reviewed General Interventions Discussed  [spoke with son to assess for any additional care coordination needs. patient's son denies any.]             SDOH assessments and interventions completed:  No  Care Coordination Interventions:  No, not indicated   Follow up plan: No further intervention required.   Encounter Outcome:  Pt. Visit Completed   Thea Silversmith, RN, MSN, BSN, [place] Coordinator [phone]

## 2022-10-02 ENCOUNTER — Emergency Department (HOSPITAL_BASED_OUTPATIENT_CLINIC_OR_DEPARTMENT_OTHER)
Admission: EM | Admit: 2022-10-02 | Discharge: 2022-10-03 | Disposition: A | Discharge status: Home / Self Care | Payer: Medicare (Managed Care) | Attending: Emergency Medicine | Admitting: Emergency Medicine

## 2022-10-02 ENCOUNTER — Other Ambulatory Visit: Payer: Self-pay

## 2022-10-02 ENCOUNTER — Encounter (HOSPITAL_BASED_OUTPATIENT_CLINIC_OR_DEPARTMENT_OTHER): Payer: Self-pay | Admitting: Emergency Medicine

## 2022-10-02 DIAGNOSIS — I509 Heart failure, unspecified: Secondary | ICD-10-CM | POA: Insufficient documentation

## 2022-10-02 DIAGNOSIS — I110 Hypertensive heart disease with heart failure: Secondary | ICD-10-CM | POA: Diagnosis not present

## 2022-10-02 DIAGNOSIS — E119 Type 2 diabetes mellitus without complications: Secondary | ICD-10-CM | POA: Diagnosis not present

## 2022-10-02 DIAGNOSIS — W25XXXA Contact with sharp glass, initial encounter: Secondary | ICD-10-CM | POA: Insufficient documentation

## 2022-10-02 DIAGNOSIS — S91112A Laceration without foreign body of left great toe without damage to nail, initial encounter: Secondary | ICD-10-CM | POA: Diagnosis not present

## 2022-10-02 DIAGNOSIS — Z794 Long term (current) use of insulin: Secondary | ICD-10-CM | POA: Diagnosis not present

## 2022-10-02 DIAGNOSIS — Z23 Encounter for immunization: Secondary | ICD-10-CM | POA: Diagnosis not present

## 2022-10-02 LAB — CBC
HCT: 36.9 % — ABNORMAL LOW (ref 39.0–52.0)
Hemoglobin: 12.1 g/dL — ABNORMAL LOW (ref 13.0–17.0)
MCH: 24.9 pg — ABNORMAL LOW (ref 26.0–34.0)
MCHC: 32.8 g/dL (ref 30.0–36.0)
MCV: 76.1 fL — ABNORMAL LOW (ref 80.0–100.0)
Platelets: 122 10*3/uL — ABNORMAL LOW (ref 150–400)
RBC: 4.85 MIL/uL (ref 4.22–5.81)
RDW: 13.5 % (ref 11.5–15.5)
WBC: 4.9 10*3/uL (ref 4.0–10.5)
nRBC: 0 % (ref 0.0–0.2)

## 2022-10-02 LAB — BASIC METABOLIC PANEL
Anion gap: 7 (ref 5–15)
BUN: 26 mg/dL — ABNORMAL HIGH (ref 8–23)
CO2: 25 mmol/L (ref 22–32)
Calcium: 9.1 mg/dL (ref 8.9–10.3)
Chloride: 99 mmol/L (ref 98–111)
Creatinine, Ser: 1.08 mg/dL (ref 0.61–1.24)
GFR, Estimated: >60 mL/min (ref >60)
Glucose, Bld: 120 mg/dL — ABNORMAL HIGH (ref 70–99)
Potassium: 4.6 mmol/L (ref 3.5–5.1)
Sodium: 131 mmol/L — ABNORMAL LOW (ref 135–145)

## 2022-10-02 LAB — HEMOGLOBIN AND HEMATOCRIT, BLOOD
HCT: 36.3 % — ABNORMAL LOW (ref 39.0–52.0)
Hemoglobin: 11.9 g/dL — ABNORMAL LOW (ref 13.0–17.0)

## 2022-10-02 MED ORDER — LIDOCAINE HCL 2 % IJ SOLN
INTRAMUSCULAR | Status: AC
  Administered 2022-10-02: 1 mg
  Filled 2022-10-02: qty 20

## 2022-10-02 MED ORDER — TETANUS-DIPHTH-ACELL PERTUSSIS 5-2.5-18.5 LF-MCG/0.5 IM SUSY
0.5000 mL | PREFILLED_SYRINGE | Freq: Once | INTRAMUSCULAR | Status: AC
  Administered 2022-10-02: 0.5 mL via INTRAMUSCULAR
  Filled 2022-10-02: qty 0.5

## 2022-10-02 MED ORDER — BACITRACIN ZINC 500 UNIT/GM EX OINT
TOPICAL_OINTMENT | Freq: Two times a day (BID) | CUTANEOUS | Status: DC
  Administered 2022-10-02: 1 via TOPICAL
  Filled 2022-10-02: qty 28.35

## 2022-10-02 MED ORDER — CEPHALEXIN 500 MG PO CAPS: 500.0000 mg | ORAL_CAPSULE | Freq: Four times a day (QID) | ORAL | 0 refills | Status: DC

## 2022-10-02 MED ORDER — CEPHALEXIN 250 MG PO CAPS
500.0000 mg | ORAL_CAPSULE | Freq: Once | ORAL | Status: AC
  Administered 2022-10-02: 500 mg via ORAL
  Filled 2022-10-02: qty 2

## 2022-10-02 MED ORDER — SODIUM CHLORIDE 0.9 % IV BOLUS
1000.0000 mL | Freq: Once | INTRAVENOUS | Status: AC
  Administered 2022-10-02: 1000 mL via INTRAVENOUS

## 2022-10-02 NOTE — ED Notes (Addendum)
Patients foot continues to bleed through multiple bandages at this time. More bandages applied. Difficult to find culprit due to amounts of blood.

## 2022-10-02 NOTE — ED Provider Notes (Addendum)
[facility] [place] Provider Note   CSN: [id number] Arrival date & time: 10/02/22  1945     History  Chief Complaint  Patient presents with   Laceration   HPI William [name] is a 72 y.o. male with history of recent liver and kidney transplant last year, hypertension, type 2 diabetes and CHF presenting for laceration. Occurred a couple hours ago. Located on the left great toe.  Patient dropped a glass blender on the kitchen floor and shard of glass struck his left foot. He noted laceration to the anterior medial aspect of his left foot. States that there was a moderate amount of bleeding.  Bleeding was controlled with gauze and direct pressure in the waiting room.  Denies shortness of breath and chest pain and fatigue.    Laceration      Home Medications Prior to Admission medications   Medication Sig Start Date End Date Taking? Authorizing Provider  cephALEXin (KEFLEX) 500 MG capsule Take 1 capsule (500 mg total) by mouth 4 (four) times daily. 10/02/22  Yes Harriet Pho, PA-C  acetaminophen (TYLENOL) 650 MG CR tablet Take 650 mg by mouth every 8 (eight) hours as needed for pain.    [provider]  amoxicillin-clavulanate (AUGMENTIN) 875-125 MG tablet Take 1 tablet by mouth every 12 (twelve) hours. 05/30/22   Domenic Moras, PA-C  calcium acetate (PHOSLO) 667 MG tablet Take 667 mg by mouth 3 (three) times daily. Patient not taking: Reported on 03/05/2022 05/09/21   [provider]  Calcium Citrate-Vitamin D 315-5 MG-MCG TABS Take by mouth. 10/03/21   [provider]  cycloSPORINE modified (NEORAL) 100 MG capsule Take '125mg'$  (one of the '100mg'$  capsules along with one of the '25mg'$  capsules) in the morning AND Take 125 mg (one of the '100mg'$  capsules along with one of the '25mg'$  capsules) in the evening, by mouth. 01/17/22   [provider]  cycloSPORINE modified (NEORAL) 25 MG capsule Take '125mg'$  (one of the '100mg'$  capsules  along with one of the '25mg'$  capsules) in the morning AND Take '125mg'$  (one of the '100mg'$  capsules along with one of the '25mg'$  capsules) in the evening, by mouth. 01/17/22   [provider]  GENGRAF 100 MG capsule Take 100 mg by mouth 2 (two) times daily. 02/26/22   [provider]  GENGRAF 25 MG capsule Take by mouth. 02/26/22   [provider]  Insulin Pen Needle (ADVOCATE INSULIN PEN NEEDLES) 31G X 5 MM MISC Use four times daily to inject insulin 04/27/18   Elayne Snare, MD  insulin regular (NOVOLIN R,HUMULIN R) 100 units/mL injection Inject 6-8 Units into the skin See admin instructions. Inject 8 units subcutaneously with breakfast, and 6 units with lunch and supper Sliding scale    [provider]  lactulose (CHRONULAC) 10 GM/15ML solution Take 45 mLs (30 g total) by mouth 3 (three) times daily. Patient not taking: Reported on 04/25/2022 06/14/21   Horald Pollen, MD  lidocaine-prilocaine (EMLA) cream Apply 1 application topically See admin instructions. Apply topically prior to dialysis on Tuesday, Thursday, Saturday Patient not taking: Reported on 04/25/2022 08/24/18   [provider]  magnesium oxide (MAG-OX) 400 MG tablet Take by mouth. 02/26/22   [provider]  Multiple Vitamin (MULTIVITAMIN WITH MINERALS) TABS tablet Take 1 tablet by mouth daily.    [provider]  mupirocin ointment (BACTROBAN) 2 % Apply 1 application topically daily. Patient not taking: Reported on 07/26/2021 05/30/21  Horald Pollen, MD  mycophenolate (CELLCEPT) 250 MG capsule Take 500 mg by mouth 2 (two) times daily. 02/26/22   [provider]  ondansetron (ZOFRAN ODT) 4 MG disintegrating tablet Take 1 tablet (4 mg total) by mouth 2 (two) times daily as needed for nausea or vomiting. 05/01/20   [name], Madelynn Done C, PA-C  ONE TOUCH LANCETS MISC Use to test blood sugar three times daily 04/27/18   Elayne Snare, MD  [facility] VERIO test strip USE 1 STRIP  TO CHECK GLUCOSE THREE TIMES DAILY 07/23/21   Elayne Snare, MD  oxyCODONE-acetaminophen (PERCOCET) 5-325 MG tablet Take 1 tablet by mouth every 6 (six) hours as needed. 07/27/21   Rhyne, Hulen Shouts, PA-C  predniSONE (DELTASONE) 5 MG tablet Take by mouth. 02/15/22   [provider]  rifaximin (XIFAXAN) 550 MG TABS tablet Take 1 tablet (550 mg total) by mouth 2 (two) times daily. Patient not taking: Reported on 03/05/2022 07/07/20   Shawna Clamp, MD  rosuvastatin (CRESTOR) 5 MG tablet Take 1 tablet (5 mg total) by mouth daily. Patient not taking: Reported on 07/26/2021 10/18/20   Elayne Snare, MD  sevelamer carbonate (RENVELA) 800 MG tablet Take 800 mg by mouth 3 (three) times daily. Patient not taking: Reported on 03/05/2022 09/25/20   [provider]  TRESIBA FLEXTOUCH 100 UNIT/ML FlexTouch Pen INJECT 14 UNITS SUBCUTANEOUSLY ONCE DAILY 12/21/21   Horald Pollen, MD      Allergies    Heparin and Pork-derived products    Review of Systems   Review of Systems  Skin:        Left great to laceration    Physical Exam Updated Vital Signs BP 109/65   Pulse 80   Temp 98.4 F (36.9 C) (Oral)   Resp 16   Ht '5\' 5"'$  (1.651 m)   SpO2 100%   BMI 23.63 kg/m  Physical Exam Constitutional:      Appearance: Normal appearance.  HENT:     Head: Normocephalic.     Nose: Nose normal.  Eyes:     Conjunctiva/sclera: Conjunctivae normal.  Pulmonary:     Effort: Pulmonary effort is normal.  Feet:     Comments: 2.5 cm laceration noted about the medial aspect of the left great toe.  Begins at the base of the toe and extends distally beyond the distal aspect of the nail.  Pulsatile bleeding with bright red blood noted.  Bleeding controlled with direct pressure.  Neurological:     Mental Status: He is alert.  Psychiatric:        Mood and Affect: Mood normal.     ED Results / Procedures / Treatments   Labs (all labs ordered are listed, but only abnormal results are  displayed) Labs Reviewed  HEMOGLOBIN AND HEMATOCRIT, BLOOD - Abnormal; Notable for the following components:      Result Value   Hemoglobin 11.9 (*)    HCT 36.3 (*)    All other components within normal limits  CBC  BASIC METABOLIC PANEL    EKG None  Radiology No results found.  Procedures .Marland KitchenLaceration Repair  Date/Time: 10/02/2022 10:29 PM  Performed by: Harriet Pho, PA-C Authorized by: Harriet Pho, PA-C   Consent:    Consent obtained:  Verbal   Consent given by:  Patient   Risks discussed:  Infection and retained foreign body   Alternatives discussed:  No treatment Universal protocol:    Procedure explained and questions answered to patient or proxy's satisfaction: yes  Relevant documents present and verified: yes     Patient identity confirmed:  Verbally with patient and arm band Anesthesia:    Anesthesia method:  Local infiltration   Local anesthetic:  Lidocaine 2% w/o epi Laceration details:    Location:  Toe   Toe location:  L big toe   Length (cm):  2.5   Depth (mm):  4 Pre-procedure details:    Preparation:  Patient was prepped and draped in usual sterile fashion Exploration:    Hemostasis achieved with:  Tourniquet and direct pressure (Bleeding improved after tourniquet applied for 10 minutes.  However some pulsatile bleeding noted after removal of tourniquet.  At this point applied figure-of-eight stitch x 1 and hemostasis achieved.)   Imaging outcome: foreign body noted     Wound exploration: wound explored through full range of motion     Wound extent: areolar tissue violated     Contaminated: no   Treatment:    Area cleansed with:  Shur-Clens   Amount of cleaning:  Extensive   Irrigation solution:  Sterile water   Irrigation method:  Pressure wash   Visualized foreign bodies/material removed: no     Undermining:  None   Scar revision: no   Skin repair:    Repair method:  Sutures   Suture size:  4-0   Suture technique:  Figure eight  and simple interrupted   Number of sutures:  6 Approximation:    Approximation:  Close Repair type:    Repair type:  Intermediate Post-procedure details:    Dressing:  Antibiotic ointment and non-adherent dressing   Procedure completion:  Tolerated well, no immediate complications     Medications Ordered in ED Medications  bacitracin ointment (1 Application Topical Given 10/02/22 2232)  sodium chloride 0.9 % bolus 1,000 mL (has no administration in time range)  lidocaine (XYLOCAINE) 2 % (with pres) injection (1 mg  Given 10/02/22 2250)  Tdap (BOOSTRIX) injection 0.5 mL (0.5 mLs Intramuscular Given 10/02/22 2228)  cephALEXin (KEFLEX) capsule 500 mg (500 mg Oral Given 10/02/22 2227)    ED Course/ Medical Decision Making/ A&P Clinical Course as of 10/02/22 2329  Wed Oct 02, 2022  2326 At discharge, patient was sat up in blood pressure measured at time was 82/62.  Ordered CBC, BMP and fluids [JR]    Clinical Course User Index [JR] Harriet Pho, PA-C                             Medical Decision Making Amount and/or Complexity of Data Reviewed Labs: ordered.  Risk OTC drugs. Prescription drug management.   72 year old male who is well-appearing and hemodynamically stable presenting for laceration to his left great toe.  Exam was notable for laceration with active likely arterial pulsatile bleeding.  Hemostasis achieved with figure-of-eight stitch x 1.  The remainder of the laceration pair was well-tolerated.  Upon reevaluation the laceration was well-approximated without bleeding.  Applied bacitracin.  Gave Tdap booster.  Applied nonadherent dressing.  Started patient on Keflex.  Recheck vitals for discharge, patient was seated in bed and was found to have blood pressure of 82/62 this prompted further evaluation with CBC, BMP and fluid bolus.  Signed out patient to Dr. Sedonia Small who will continue to manage and follow-up on labs and recheck vitals after fluid resuscitation.    Final  Clinical Impression(s) / ED Diagnoses Final diagnoses:  Laceration of great toe of left foot, foreign body presence  unspecified, nail damage status unspecified, initial encounter    Rx / DC Orders ED Discharge Orders          Ordered    cephALEXin (KEFLEX) 500 MG capsule  4 times daily        10/02/22 2304               Harriet Pho, PA-C 10/02/22 2305    Harriet Pho, PA-C 10/02/22 2356    Drenda Freeze, MD 10/04/22 2257

## 2022-10-02 NOTE — ED Notes (Signed)
Patient was being escorted to discharge when he became weak and light headed. Patient began to fall, but was caught and placed in a wheelchair. Patients blood pressure was obtained and noted to be 85/65. Patient was placed back in the bed and EDP notified. See MAR for further.

## 2022-10-02 NOTE — ED Triage Notes (Signed)
Pt with laceration to LT foot from a broken blender glass; copious bleeding noted

## 2022-10-02 NOTE — Discharge Instructions (Addendum)
Sutured repair Keep the laceration site dry for the next 24 hours and leave the dressing in place. After 24 hours you may remove the dressing and gently clean the laceration site with antibacterial soap and warm water. Do not scrub the area. Do not soak the area and water for long periods of time. Don't use hydrogen peroxide, iodine-based solutions, or alcohol, which can slow healing, and will probably be painful! Apply topical bacitracin 1-2 times per day for the next 3-5 days. Return to the emergency department, urgent care, or PCP in 5 to 7 days for removal of the sutures.  You should return sooner for any signs of infection which would include increased redness around the wound, increased swelling, new drainage of yellow pus.    Adhesive tape repair Tape strips have the advantage of being less painful to apply and lower risk of infection, but do require more caution on your part, as they are more fragile than other skin closure techniques.  It's important to: -Keep the tape clean and dry -Avoid picking at the tape or rubbing the area -Avoid soaking in water (showering is okay-bathing is not) -The tape strips will fall off on their own in about 5-7 days (if they don't, you can gently remove them or soak the wound in water at this time to loosen them).  At this time, scar tissue will be forming under the surface of the wound and your body will do the rest of the work of healing.  I have also started you on Keflex which is an antibiotic to cover for possible infection.  I have sent the antibiotic to your pharmacy.  Please take the entire course.

## 2022-10-03 MED ORDER — ACETAMINOPHEN 500 MG PO TABS
1000.0000 mg | ORAL_TABLET | Freq: Once | ORAL | Status: AC
  Administered 2022-10-03: 1000 mg via ORAL
  Filled 2022-10-03: qty 2

## 2022-10-03 NOTE — ED Provider Notes (Signed)
  Provider Note MRN:  [id number]  Arrival date & time: 10/03/22    ED Course and Medical Decision Making  Assumed care from Dr. Darl Householder at shift change.  Toe laceration with a fair amount of blood loss.  Was orthostatic upon attempted discharge.  Providing liter of fluid and will reassess.  Procedures  Final Clinical Impressions(s) / ED Diagnoses     ICD-10-CM   1. Laceration of great toe of left foot, foreign body presence unspecified, nail damage status unspecified, initial encounter  [id number]       ED Discharge Orders          Ordered    cephALEXin (KEFLEX) 500 MG capsule  4 times daily        10/02/22 2304              Discharge Instructions      Sutured repair Keep the laceration site dry for the next 24 hours and leave the dressing in place. After 24 hours you may remove the dressing and gently clean the laceration site with antibacterial soap and warm water. Do not scrub the area. Do not soak the area and water for long periods of time. Don't use hydrogen peroxide, iodine-based solutions, or alcohol, which can slow healing, and will probably be painful! Apply topical bacitracin 1-2 times per day for the next 3-5 days. Return to the emergency department, urgent care, or PCP in 5 to 7 days for removal of the sutures.  You should return sooner for any signs of infection which would include increased redness around the wound, increased swelling, new drainage of yellow pus.    Adhesive tape repair Tape strips have the advantage of being less painful to apply and lower risk of infection, but do require more caution on your part, as they are more fragile than other skin closure techniques.  It's important to: -Keep the tape clean and dry -Avoid picking at the tape or rubbing the area -Avoid soaking in water (showering is okay-bathing is not) -The tape strips will fall off on their own in about 5-7 days (if they don't, you can gently remove them or soak the wound in water at this  time to loosen them).  At this time, scar tissue will be forming under the surface of the wound and your body will do the rest of the work of healing.  I have also started you on Keflex which is an antibiotic to cover for possible infection.  I have sent the antibiotic to your pharmacy.  Please take the entire course.     Barth Kirks. Sedonia Small, [place] [email or web address]

## 2022-10-04 ENCOUNTER — Telehealth: Payer: Self-pay

## 2022-10-04 NOTE — Transitions of Care (Post Inpatient/ED Visit) (Unsigned)
   10/04/2022  Name: William [name] MRN: [id number] DOB: 04/09/1951  Today's TOC FU Call Status: Today's TOC FU Call Status:: Unsuccessul Call (1st Attempt) Unsuccessful Call (1st Attempt) Date: 10/04/22  Attempted to reach the patient regarding the most recent Inpatient/ED visit.  Follow Up Plan: Additional outreach attempts will be made to reach the patient to complete the Transitions of Care (Post Inpatient/ED visit) call.   Signature Juanda Crumble, [place] Direct Dial [phone]

## 2022-10-08 NOTE — Transitions of Care (Post Inpatient/ED Visit) (Signed)
   10/08/2022  Name: William [name] MRN: [id number] DOB: 1951-04-06  Today's TOC FU Call Status: Today's TOC FU Call Status:: Successful TOC FU Call Competed Unsuccessful Call (1st Attempt) Date: 10/04/22 [facility] FU Call Complete Date: 10/08/22  Transition Care Management Follow-up Telephone Call Date of Discharge: 10/03/22 Discharge Facility: [facility] Type of Discharge: Emergency Department Reason for ED Visit: Other: (laceration) How have you been since you were released from the hospital?: Better Any questions or concerns?: No  Items Reviewed: Did you receive and understand the discharge instructions provided?: Yes Medications obtained and verified?: Yes (Medications Reviewed) Any new allergies since your discharge?: No Dietary orders reviewed?: NA Do you have support at home?: Yes People in Home: child(ren), adult  Home Care and Equipment/Supplies: [place] Ordered?: NA Any new equipment or medical supplies ordered?: NA  Functional Questionnaire: Do you need assistance with bathing/showering or dressing?: No Do you need assistance with meal preparation?: No Do you need assistance with eating?: No Do you have difficulty maintaining continence: No Do you need assistance with getting out of bed/getting out of a chair/moving?: No Do you have difficulty managing or taking your medications?: No  Folllow up appointments reviewed: PCP Follow-up appointment confirmed?: No (no avail appt , sent message to staff to schedule) MD Provider Line [phone] Given: No [facility] Follow-up appointment confirmed?: NA Do you need transportation to your follow-up appointment?: No Do you understand care options if your condition(s) worsen?: Yes-patient verbalized understanding    [place], [place] Nurse Health Advisor Direct Dial [phone]

## 2022-10-10 ENCOUNTER — Ambulatory Visit
Admission: EM | Admit: 2022-10-10 | Discharge: 2022-10-10 | Disposition: A | Discharge status: Home / Self Care | Payer: Medicare (Managed Care)

## 2022-10-10 DIAGNOSIS — Z4802 Encounter for removal of sutures: Secondary | ICD-10-CM

## 2022-10-10 NOTE — ED Triage Notes (Signed)
Pt for suture removal to left great toe-placed 2/28-NAD-steady gait-son is interpreting for pt

## 2022-10-10 NOTE — ED Provider Notes (Signed)
[facility]  Note:  This document was prepared using Systems analyst and may include unintentional dictation errors.  MRN: [id number] DOB: 08-01-1951  Subjective:   William [name] is a 72 y.o. male presenting for wound check, consideration for suture removal.  Patient had these placed 10/02/2022 over the left great toe.  He just started taking cephalexin as antibiotic in the past day.  Denies drainage of pus or bleeding.  But he has had swelling.  This is decreased since taking the antibiotic in the past day.  No current facility-administered medications for this encounter.  Current Outpatient Medications:    acetaminophen (TYLENOL) 650 MG CR tablet, Take 650 mg by mouth every 8 (eight) hours as needed for pain., Disp: , Rfl:    amoxicillin-clavulanate (AUGMENTIN) 875-125 MG tablet, Take 1 tablet by mouth every 12 (twelve) hours., Disp: 14 tablet, Rfl: 0   calcium acetate (PHOSLO) 667 MG tablet, Take 667 mg by mouth 3 (three) times daily. (Patient not taking: Reported on 10/08/2022), Disp: , Rfl:    Calcium Citrate-Vitamin D 315-5 MG-MCG TABS, Take by mouth., Disp: , Rfl:    cephALEXin (KEFLEX) 500 MG capsule, Take 1 capsule (500 mg total) by mouth 4 (four) times daily., Disp: 20 capsule, Rfl: 0   cycloSPORINE modified (NEORAL) 100 MG capsule, Take '125mg'$  (one of the '100mg'$  capsules along with one of the '25mg'$  capsules) in the morning AND Take 125 mg (one of the '100mg'$  capsules along with one of the '25mg'$  capsules) in the evening, by mouth., Disp: , Rfl:    cycloSPORINE modified (NEORAL) 25 MG capsule, Take '125mg'$  (one of the '100mg'$  capsules along with one of the '25mg'$  capsules) in the morning AND Take '125mg'$  (one of the '100mg'$  capsules along with one of the '25mg'$  capsules) in the evening, by mouth., Disp: , Rfl:    GENGRAF 100 MG capsule, Take 100 mg by mouth 2 (two) times daily., Disp: , Rfl:    GENGRAF 25 MG capsule, Take by mouth., Disp: , Rfl:    Insulin Pen  Needle (ADVOCATE INSULIN PEN NEEDLES) 31G X 5 MM MISC, Use four times daily to inject insulin, Disp: 150 each, Rfl: 1   insulin regular (NOVOLIN R,HUMULIN R) 100 units/mL injection, Inject 6-8 Units into the skin See admin instructions. Inject 8 units subcutaneously with breakfast, and 6 units with lunch and supper Sliding scale, Disp: , Rfl:    lactulose (CHRONULAC) 10 GM/15ML solution, Take 45 mLs (30 g total) by mouth 3 (three) times daily., Disp: 946 mL, Rfl: 1   lidocaine-prilocaine (EMLA) cream, Apply 1 application topically See admin instructions. Apply topically prior to dialysis on Tuesday, Thursday, Saturday (Patient not taking: Reported on 10/08/2022), Disp: , Rfl:    magnesium oxide (MAG-OX) 400 MG tablet, Take by mouth., Disp: , Rfl:    Multiple Vitamin (MULTIVITAMIN WITH MINERALS) TABS tablet, Take 1 tablet by mouth daily., Disp: , Rfl:    mupirocin ointment (BACTROBAN) 2 %, Apply 1 application topically daily., Disp: 30 g, Rfl: 2   mycophenolate (CELLCEPT) 250 MG capsule, Take 500 mg by mouth 2 (two) times daily., Disp: , Rfl:    ondansetron (ZOFRAN ODT) 4 MG disintegrating tablet, Take 1 tablet (4 mg total) by mouth 2 (two) times daily as needed for nausea or vomiting., Disp: 20 tablet, Rfl: 0   ONE TOUCH LANCETS MISC, Use to test blood sugar three times daily, Disp: 100 each, Rfl: 2   ONETOUCH VERIO test  strip, USE 1 STRIP TO CHECK GLUCOSE THREE TIMES DAILY, Disp: 150 each, Rfl: 0   oxyCODONE-acetaminophen (PERCOCET) 5-325 MG tablet, Take 1 tablet by mouth every 6 (six) hours as needed., Disp: 20 tablet, Rfl: 0   predniSONE (DELTASONE) 5 MG tablet, Take by mouth., Disp: , Rfl:    rifaximin (XIFAXAN) 550 MG TABS tablet, Take 1 tablet (550 mg total) by mouth 2 (two) times daily., Disp: 60 tablet, Rfl: 1   rosuvastatin (CRESTOR) 5 MG tablet, Take 1 tablet (5 mg total) by mouth daily., Disp: 90 tablet, Rfl: 0   sevelamer carbonate (RENVELA) 800 MG tablet, Take 800 mg by mouth 3 (three)  times daily., Disp: , Rfl:    TRESIBA FLEXTOUCH 100 UNIT/ML FlexTouch Pen, INJECT 14 UNITS SUBCUTANEOUSLY ONCE DAILY, Disp: 3 mL, Rfl: 0   Allergies  Allergen Reactions   Heparin Other (See Comments)    Patient is Muslim and is not permitted Pork derative due to religious belief)   Pork-Derived Products Other (See Comments)    Patient does not eat pork due to religious beliefs    Past Medical History:  Diagnosis Date   Anemia    Blind one eye    CHF (congestive heart failure) ([place])    Cirrhosis ([place])    ESRD on hemodialysis ([place])    T, Th, [place]   Goodpasture's disease [facility])    on outpatient plasmapheresis/notes 11/20/2017   Grade I diastolic dysfunction [id number]   History of anemia due to chronic kidney disease    History of blood transfusion X 1   "UGIB; low blood count"   History of plasmapheresis    "qod" (11/20/2017)   Hypertension    Pancytopenia ([place]) 08/20/2017   Type II diabetes mellitus [facility])      Past Surgical History:  Procedure Laterality Date   A/V FISTULAGRAM Left 10/16/2020   Procedure: A/V FISTULAGRAM;  Surgeon: Waynetta Sandy, MD;  Location: [place] [facility];  Service: Cardiovascular;  Laterality: Left;   AMPUTATION TOE Left 10/07/2020   Procedure: AMPUTATION THIRD TOE;  Surgeon: Criselda Peaches, DPM;  Location: [place];  Service: Podiatry;  Laterality: Left;   AV FISTULA PLACEMENT Left 12/26/2017   Procedure: LEFT BRACHIOCEPHALIC ARTERIOVENOUS (AV) FISTULA CREATION;  Surgeon: Conrad Thurston, MD;  Location: [place];  Service: Vascular;  Laterality: Left;   AV FISTULA PLACEMENT Left 11/02/2020   Procedure: INSERTION OF ARTERIOVENOUS (AV) GORE-TEX GRAFT ARM;  Surgeon: Waynetta Sandy, MD;  Location: [place];  Service: Vascular;  Laterality: Left;   AV FISTULA PLACEMENT Right 06/01/2021   Procedure: RIGHT ARM BRACHIOBASILIC ARTERIOVENOUS (AV) FISTULA CREATION;  Surgeon: Waynetta Sandy, MD;  Location: [place];  Service: Vascular;   Laterality: Right;   BASCILIC VEIN TRANSPOSITION Left 02/16/2018   Procedure: BRACHIOBASILIC VEIN TRANSPOSITION SECOND STAGE;  Surgeon: Conrad [place], MD;  Location: [place];  Service: Vascular;  Laterality: Left;   [place] Right 07/27/2021   Procedure: SECOND STAGE RIGHT ARM BASILIC VEIN TRANSPOSITION;  Surgeon: Cherre Robins, MD;  Location: [facility];  Service: Vascular;  Laterality: Right;   CATARACT EXTRACTION W/ INTRAOCULAR LENS  IMPLANT, BILATERAL Bilateral    ESOPHAGEAL BANDING N/A 04/01/2018   Procedure: ESOPHAGEAL BANDING;  Surgeon: Otis Brace, MD;  Location: [facility];  Service: Gastroenterology;  Laterality: N/A;   ESOPHAGEAL BANDING N/A 06/29/2018   Procedure: ESOPHAGEAL BANDING;  Surgeon: Otis Brace, MD;  Location: [facility];  Service: Gastroenterology;  Laterality: N/A;  ESOPHAGOGASTRODUODENOSCOPY (EGD) WITH PROPOFOL N/A 04/01/2018   Procedure: ESOPHAGOGASTRODUODENOSCOPY (EGD) WITH PROPOFOL;  Surgeon: Otis Brace, MD;  [facility];  Service: Gastroenterology;  Laterality: N/A;   ESOPHAGOGASTRODUODENOSCOPY (EGD) WITH PROPOFOL N/A 06/29/2018   Procedure: ESOPHAGOGASTRODUODENOSCOPY (EGD) WITH PROPOFOL;  Surgeon: Otis Brace, MD;  [facility];  Service: Gastroenterology;  Laterality: N/A;   ESOPHAGOGASTRODUODENOSCOPY (EGD) WITH PROPOFOL N/A 09/30/2018   Procedure: ESOPHAGOGASTRODUODENOSCOPY (EGD) WITH PROPOFOL;  Surgeon: Otis Brace, MD;  [facility];  Service: Gastroenterology;  Laterality: N/A;   ESOPHAGOGASTRODUODENOSCOPY (EGD) WITH PROPOFOL N/A 04/20/2019   Procedure: ESOPHAGOGASTRODUODENOSCOPY (EGD) WITH PROPOFOL;  Surgeon: Otis Brace, MD;  [facility];  Service: Gastroenterology;  Laterality: N/A;   ESOPHAGOGASTRODUODENOSCOPY (EGD) WITH PROPOFOL N/A 04/17/2020   Procedure: ESOPHAGOGASTRODUODENOSCOPY (EGD) WITH PROPOFOL;  Surgeon: Wonda Horner, MD;  Location: [place];   Service: Gastroenterology;  Laterality: N/A;   I & D EXTREMITY Left 02/18/2018   Procedure: IRRIGATION AND DEBRIDEMENT LEFT ARM;  Surgeon: Serafina Mitchell, MD;  Location: [place] [facility];  Service: Vascular;  Laterality: Left;   INSERTION OF DIALYSIS CATHETER N/A 01/19/2018   Procedure: INSERTION OF DIALYSIS CATHETER - RIGHT INTERNAL JUGULAR PLACEMENT;  Surgeon: Rosetta Posner, MD;  Location: [facility];  Service: Vascular;  Laterality: N/A;   INSERTION OF DIALYSIS CATHETER Right 11/02/2020   Procedure: INSERTION OF TUNNELED DIALYSIS CATHETER;  Surgeon: Waynetta Sandy, MD;  Location: [place];  Service: Vascular;  Laterality: Right;   IR FLUORO GUIDE CV LINE RIGHT  11/10/2017   IR PARACENTESIS  12/10/2017   IR PARACENTESIS  12/26/2017   IR REMOVAL TUN CV CATH W/O FL  11/28/2017   IR THROMBECTOMY AV FISTULA W/THROMBOLYSIS/PTA INC/SHUNT/IMG LEFT Left 06/02/2019   IR US GUIDE [place] LEFT  06/02/2019   IR US GUIDE [place] RIGHT  11/10/2017   NECK SURGERY  2007   "back of neck; no hardware in there"   [place] Left 11/02/2020   Procedure: REVISON OF ARTERIOVENOUS FISTULA;  Surgeon: Waynetta Sandy, MD;  Location: [facility] OR;  Service: Vascular;  Laterality: Left;    Family History  Problem Relation Age of Onset   Diabetes Mellitus II Sister    Diabetes Sister    Stroke Brother    Heart attack Brother     Social History   Tobacco Use   Smoking status: Never   Smokeless tobacco: Never  Vaping Use   Vaping Use: Never used  Substance Use Topics   Alcohol use: Never   Drug use: Never    ROS   Objective:   Vitals: BP (!) 155/85 (BP Location: Right Arm)   Pulse 76   Temp 98 F (36.7 C) (Oral)   Resp 16   SpO2 97%   Physical Exam Constitutional:      General: He is not in acute distress.    Appearance: Normal appearance. He is well-developed and normal weight. He is not ill-appearing, toxic-appearing or diaphoretic.  HENT:     Head: Normocephalic and  atraumatic.     Right Ear: External ear normal.     Left Ear: External ear normal.     Nose: Nose normal.     Mouth/Throat:     Pharynx: Oropharynx is clear.  Eyes:     General: No scleral icterus.       Right eye: No discharge.        Left eye: No discharge.     Extraocular Movements: Extraocular movements intact.  Cardiovascular:  Rate and Rhythm: Normal rate.  Pulmonary:     Effort: Pulmonary effort is normal.  Musculoskeletal:     Cervical back: Normal range of motion.       Feet:  Neurological:     Mental Status: He is alert and oriented to person, place, and time.  Psychiatric:        Mood and Affect: Mood normal.        Behavior: Behavior normal.        Thought Content: Thought content normal.        Judgment: Judgment normal.     Assessment and Plan :   PDMP not reviewed this encounter.  1. Encounter for removal of sutures     Patient has trace/1+ swelling about the toe without erythema, drainage of pus or bleeding.  It is my clinical opinion that it would not be appropriate to remove the sutures now out of concern for wound dehiscence.  Recommended recheck and suture removal 10/13/2022.  Patient is agreeable, will present back then.   Jaynee Eagles, PA-C 10/10/22 1758

## 2022-10-13 ENCOUNTER — Ambulatory Visit
Admission: EM | Admit: 2022-10-13 | Discharge: 2022-10-13 | Disposition: A | Discharge status: Home / Self Care | Payer: Medicare (Managed Care)

## 2022-10-13 NOTE — ED Triage Notes (Signed)
Pt presents for suture removal. Last placed in 10/02/2022.  Pt expresses no complains of pain, does not present with swelling or drainage.

## 2023-03-19 ENCOUNTER — Telehealth: Payer: Self-pay | Admitting: Emergency Medicine

## 2023-03-19 DIAGNOSIS — E1121 Type 2 diabetes mellitus with diabetic nephropathy: Secondary | ICD-10-CM

## 2023-03-19 MED ORDER — TRESIBA FLEXTOUCH 100 UNIT/ML ~~LOC~~ SOPN: 14.0000 [IU] | PEN_INJECTOR | Freq: Every day | SUBCUTANEOUS | 0 refills | Status: DC

## 2023-03-19 NOTE — Telephone Encounter (Signed)
Patient said he is completely out of his insulin. He scheduled the next opening on 03/26/2023.   Prescription Request  03/19/2023  LOV: 03/05/2022  What is the name of the medication or equipment?  TRESIBA FLEXTOUCH 100 UNIT/ML FlexTouch Pen  Have you contacted your pharmacy to request a refill? Yes   Which pharmacy would you like this sent to?  [facility] [address], [place] - [address] [place] [address] Phone: [phone] Fax: [phone]    Patient notified that their request is being sent to the clinical staff for review and that they should receive a response within 2 business days.   Please advise at Mobile [phone] (mobile)

## 2023-03-19 NOTE — Telephone Encounter (Signed)
Medication sent to patient pharmacy.

## 2023-03-26 ENCOUNTER — Encounter: Payer: Self-pay | Admitting: Emergency Medicine

## 2023-03-26 ENCOUNTER — Ambulatory Visit (INDEPENDENT_AMBULATORY_CARE_PROVIDER_SITE_OTHER): Payer: Medicare (Managed Care) | Admitting: Emergency Medicine

## 2023-03-26 VITALS — BP 128/74 | HR 77 | Temp 97.8°F | Ht 65.0 in | Wt 150.2 lb

## 2023-03-26 DIAGNOSIS — Z940 Kidney transplant status: Secondary | ICD-10-CM

## 2023-03-26 DIAGNOSIS — Z794 Long term (current) use of insulin: Secondary | ICD-10-CM | POA: Diagnosis not present

## 2023-03-26 DIAGNOSIS — E1121 Type 2 diabetes mellitus with diabetic nephropathy: Secondary | ICD-10-CM | POA: Diagnosis not present

## 2023-03-26 DIAGNOSIS — E118 Type 2 diabetes mellitus with unspecified complications: Secondary | ICD-10-CM | POA: Insufficient documentation

## 2023-03-26 DIAGNOSIS — Z944 Liver transplant status: Secondary | ICD-10-CM

## 2023-03-26 DIAGNOSIS — I5022 Chronic systolic (congestive) heart failure: Secondary | ICD-10-CM

## 2023-03-26 DIAGNOSIS — K08109 Complete loss of teeth, unspecified cause, unspecified class: Secondary | ICD-10-CM

## 2023-03-26 NOTE — Assessment & Plan Note (Signed)
 Recommend podiatry evaluation Referral placed today

## 2023-03-26 NOTE — Assessment & Plan Note (Signed)
Doing well.  Stable. Continues immunosuppressive treatment Followed at [facility]

## 2023-03-26 NOTE — Assessment & Plan Note (Addendum)
Last hemoglobin A1c 10.4 last month.  Insulin-dependent Recommend to increase daily Tresiba insulin to 20 units and continue using Premeal regular insulin as per sliding scale Hypoglycemia precautions given

## 2023-03-26 NOTE — Progress Notes (Signed)
William [name] 72 y.o.   Chief Complaint  Patient presents with   Medical Management of Chronic Issues    F/u appt, no concerns     HISTORY OF PRESENT ILLNESS: This is a 72 y.o. male here for follow-up of chronic medical conditions Last office visit 1 year ago.  Accompanied by son. Organ transplant patient, liver and kidney.  Goes to [facility] on a regular basis. Has no complaints or medical concerns today.  HPI   Prior to Admission medications   Medication Sig Start Date End Date Taking? Authorizing Provider  acetaminophen (TYLENOL) 650 MG CR tablet Take 650 mg by mouth every 8 (eight) hours as needed for pain.   Yes [provider]  cycloSPORINE modified (NEORAL) 100 MG capsule Take 125mg  (one of the 100mg  capsules along with one of the 25mg  capsules) in the morning AND Take 125 mg (one of the 100mg  capsules along with one of the 25mg  capsules) in the evening, by mouth. 01/17/22  Yes [provider]  cycloSPORINE modified (NEORAL) 25 MG capsule Take 125mg  (one of the 100mg  capsules along with one of the 25mg  capsules) in the morning AND Take 125mg  (one of the 100mg  capsules along with one of the 25mg  capsules) in the evening, by mouth. 01/17/22  Yes [provider]  GENGRAF 100 MG capsule Take 100 mg by mouth 2 (two) times daily. 02/26/22  Yes [provider]  insulin degludec (TRESIBA FLEXTOUCH) 100 UNIT/ML FlexTouch Pen Inject 14 Units into the skin daily. 03/19/23  Yes William [name], William Kempf, MD  Insulin Pen Needle (ADVOCATE INSULIN PEN NEEDLES) 31G X 5 MM MISC Use four times daily to inject insulin 04/27/18  Yes William Littler, MD  insulin regular (NOVOLIN R,HUMULIN R) 100 units/mL injection Inject 6-8 Units into the skin See admin instructions. Inject 8 units subcutaneously with breakfast, and 6 units with lunch and supper Sliding scale   Yes [provider]  magnesium oxide (MAG-OX) 400 MG tablet Take by mouth. 02/26/22   Yes [provider]  Multiple Vitamin (MULTIVITAMIN WITH MINERALS) TABS tablet Take 1 tablet by mouth daily.   Yes [provider]  mupirocin ointment (BACTROBAN) 2 % Apply 1 application topically daily. 05/30/21  Yes [name], William Kempf, MD  mycophenolate (CELLCEPT) 250 MG capsule Take 500 mg by mouth 2 (two) times daily. 02/26/22  Yes [provider]  ondansetron (ZOFRAN ODT) 4 MG disintegrating tablet Take 1 tablet (4 mg total) by mouth 2 (two) times daily as needed for nausea or vomiting. 05/01/20  Yes William [name], [place] C, PA-C  ONE TOUCH LANCETS MISC Use to test blood sugar three times daily 04/27/18  Yes William Littler, MD  [facility] VERIO test strip USE 1 STRIP TO CHECK GLUCOSE THREE TIMES DAILY 07/23/21  Yes William Littler, MD  predniSONE (DELTASONE) 5 MG tablet Take by mouth. 02/15/22  Yes [provider]  calcium acetate (PHOSLO) 667 MG tablet Take 667 mg by mouth 3 (three) times daily. Patient not taking: Reported on 10/08/2022 05/09/21   [provider]  Calcium Citrate-Vitamin D 315-5 MG-MCG TABS Take by mouth. 10/03/21   [provider]  cephALEXin (KEFLEX) 500 MG capsule Take 1 capsule (500 mg total) by mouth 4 (four) times daily. 10/02/22   William Eagle, PA-C  GENGRAF 25 MG capsule Take by mouth. 02/26/22   [provider]  lactulose (CHRONULAC) 10 GM/15ML solution Take 45 mLs (30 g total) by mouth 3 (three) times daily.  Patient not taking: Reported on 03/26/2023 06/14/21   William Quint, MD  lidocaine-prilocaine (EMLA) cream Apply 1 application topically See admin instructions. Apply topically prior to dialysis on Tuesday, Thursday, Saturday Patient not taking: Reported on 10/08/2022 08/24/18   [provider]  oxyCODONE-acetaminophen (PERCOCET) 5-325 MG tablet Take 1 tablet by mouth every 6 (six) hours as needed. Patient not taking: Reported on 03/26/2023 07/27/21   William Lords, PA-C  rifaximin (XIFAXAN) 550 MG  TABS tablet Take 1 tablet (550 mg total) by mouth 2 (two) times daily. Patient not taking: Reported on 03/26/2023 07/07/20   William Niece, MD  rosuvastatin (CRESTOR) 5 MG tablet Take 1 tablet (5 mg total) by mouth daily. Patient not taking: Reported on 03/26/2023 10/18/20   William Littler, MD  sevelamer carbonate (RENVELA) 800 MG tablet Take 800 mg by mouth 3 (three) times daily. 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   Elevated PSA 12/14/2020   Repeated falls 11/13/2020   Coagulation defect, unspecified (HCC) 11/06/2020   Other microscopic hematuria 11/03/2020   Traumatic amputation of third toe of left foot (HCC) 10/15/2020   Diabetic foot infection (HCC) 10/06/2020   Thrombotic microangiopathy, 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   Anuria 04/27/2020   Awaiting organ transplant 04/27/2020   Physical deconditioning 04/27/2020   S/P TIPS (transjugular intrahepatic portosystemic shunt) 04/27/2020   Presbycusis of both ears 09/24/2019   AV fistula thrombosis, initial encounter (HCC) 06/01/2019   AV fistula occlusion, initial encounter (HCC)    Chronic systolic CHF (congestive heart failure) (HCC)    Esophageal varices (HCC) 09/23/2018   Thrombocytopenia (HCC) 09/23/2018   CHF (congestive heart failure) (HCC) 09/22/2018   ESRD on dialysis (HCC) 02/06/2018   Mild protein-calorie malnutrition (HCC) 02/04/2018   Anemia in chronic kidney disease 01/22/2018   Iron deficiency anemia, unspecified 01/22/2018   Secondary hyperparathyroidism of renal origin (HCC) 01/22/2018   CKD (chronic kidney disease) stage 4, GFR 15-29 ml/min (HCC)  12/19/2017   Cirrhosis of liver with ascites (HCC) 12/09/2017   Goodpasture's syndrome (HCC) with associated hemoptysis 11/20/2017   Pancytopenia (HCC) 11/06/2017   Hypertension 11/06/2017   Type II diabetes mellitus with renal manifestations (HCC) 08/25/2015    Past Medical History:  Diagnosis Date   Anemia    Blind one eye    CHF (congestive heart failure) (HCC)    Cirrhosis (HCC)    ESRD on hemodialysis (HCC)    T, Th, Sat. [place]   Goodpasture's disease [facility])    on outpatient plasmapheresis/notes 11/20/2017   Grade I diastolic dysfunction 10/14/2017   History of anemia due to chronic kidney disease    History of blood transfusion X 1   "UGIB; low blood count"   History of plasmapheresis    "qod" (11/20/2017)   Hypertension    Pancytopenia (HCC) 08/20/2017   Type II diabetes mellitus [facility])     Past Surgical History:  Procedure Laterality Date   A/V FISTULAGRAM Left 10/16/2020   Procedure: A/V FISTULAGRAM;  Surgeon: Maeola Harman, MD;  Location: [facility] INVASIVE CV LAB;  Service: Cardiovascular;  Laterality: Left;  AMPUTATION TOE Left 10/07/2020   Procedure: AMPUTATION THIRD TOE;  Surgeon: Edwin Cap, DPM;  Location: [facility] OR;  Service: Podiatry;  Laterality: Left;   AV FISTULA PLACEMENT Left 12/26/2017   Procedure: LEFT BRACHIOCEPHALIC ARTERIOVENOUS (AV) FISTULA CREATION;  Surgeon: Fransisco Hertz, MD;  Location: [facility] OR;  Service: Vascular;  Laterality: Left;   AV FISTULA PLACEMENT Left 11/02/2020   Procedure: INSERTION OF ARTERIOVENOUS (AV) GORE-TEX GRAFT ARM;  Surgeon: Maeola Harman, MD;  Location: [facility] OR;  Service: Vascular;  Laterality: Left;   AV FISTULA PLACEMENT Right 06/01/2021   Procedure: RIGHT ARM BRACHIOBASILIC ARTERIOVENOUS (AV) FISTULA CREATION;  Surgeon: Maeola Harman, MD;  Location: [facility] OR;  Service: Vascular;  Laterality: Right;   BASCILIC VEIN TRANSPOSITION Left 02/16/2018   Procedure: BRACHIOBASILIC VEIN TRANSPOSITION SECOND  STAGE;  Surgeon: Fransisco Hertz, MD;  Location: [facility] OR;  Service: Vascular;  Laterality: Left;   BASCILIC VEIN TRANSPOSITION Right 07/27/2021   Procedure: SECOND STAGE RIGHT ARM BASILIC VEIN TRANSPOSITION;  Surgeon: Leonie Douglas, MD;  Location: [facility] OR;  Service: Vascular;  Laterality: Right;   CATARACT EXTRACTION W/ INTRAOCULAR LENS  IMPLANT, BILATERAL Bilateral    ESOPHAGEAL BANDING N/A 04/01/2018   Procedure: ESOPHAGEAL BANDING;  Surgeon: Kathi Der, MD;  Location: [facility] ENDOSCOPY;  Service: Gastroenterology;  Laterality: N/A;   ESOPHAGEAL BANDING N/A 06/29/2018   Procedure: ESOPHAGEAL BANDING;  Surgeon: Kathi Der, MD;  Location: [facility] ENDOSCOPY;  Service: Gastroenterology;  Laterality: N/A;   ESOPHAGOGASTRODUODENOSCOPY (EGD) WITH PROPOFOL N/A 04/01/2018   Procedure: ESOPHAGOGASTRODUODENOSCOPY (EGD) WITH PROPOFOL;  Surgeon: Kathi Der, MD;  Location: [facility] ENDOSCOPY;  Service: Gastroenterology;  Laterality: N/A;   ESOPHAGOGASTRODUODENOSCOPY (EGD) WITH PROPOFOL N/A 06/29/2018   Procedure: ESOPHAGOGASTRODUODENOSCOPY (EGD) WITH PROPOFOL;  Surgeon: Kathi Der, MD;  Location: [facility] ENDOSCOPY;  Service: Gastroenterology;  Laterality: N/A;   ESOPHAGOGASTRODUODENOSCOPY (EGD) WITH PROPOFOL N/A 09/30/2018   Procedure: ESOPHAGOGASTRODUODENOSCOPY (EGD) WITH PROPOFOL;  Surgeon: Kathi Der, MD;  Location: [facility]OS[facility];  Service: Gastroenterology;  Laterality: N/A;   ESOPHAGOGASTRODUODENOSCOPY (EGD) WITH PROPOFOL N/A 04/20/2019   Procedure: ESOPHAGOGASTRODUODENOSCOPY (EGD) WITH PROPOFOL;  Surgeon: Kathi Der, MD;  Location: [facility];  Service: Gastroenterology;  Laterality: N/A;   ESOPHAGOGASTRODUODENOSCOPY (EGD) WITH PROPOFOL N/A 04/17/2020   Procedure: ESOPHAGOGASTRODUODENOSCOPY (EGD) WITH PROPOFOL;  Surgeon: Graylin Shiver, MD;  Location: [facility] ENDOSCOPY;  Service: Gastroenterology;  Laterality: N/A;   I & D EXTREMITY Left 02/18/2018   Procedure: IRRIGATION AND DEBRIDEMENT LEFT  ARM;  Surgeon: Nada Libman, MD;  Location: [facility] OR;  Service: Vascular;  Laterality: Left;   INSERTION OF DIALYSIS CATHETER N/A 01/19/2018   Procedure: INSERTION OF DIALYSIS CATHETER - RIGHT INTERNAL JUGULAR PLACEMENT;  Surgeon: Larina Earthly, MD;  Location: [facility] OR;  Service: Vascular;  Laterality: N/A;   INSERTION OF DIALYSIS CATHETER Right 11/02/2020   Procedure: INSERTION OF TUNNELED DIALYSIS CATHETER;  Surgeon: Maeola Harman, MD;  Location: [facility] OR;  Service: Vascular;  Laterality: Right;   IR FLUORO GUIDE CV LINE RIGHT  11/10/2017   IR PARACENTESIS  12/10/2017   IR PARACENTESIS  12/26/2017   IR REMOVAL TUN CV CATH W/O FL  11/28/2017   IR THROMBECTOMY AV FISTULA W/THROMBOLYSIS/PTA INC/SHUNT/IMG LEFT Left 06/02/2019   IR US GUIDE VASC ACCESS LEFT  06/02/2019   IR US GUIDE VASC ACCESS RIGHT  11/10/2017   NECK SURGERY  2007   "back of neck; no hardware in there"   REVISON OF ARTERIOVENOUS FISTULA Left 11/02/2020   Procedure: REVISON OF ARTERIOVENOUS FISTULA;  Surgeon: Maeola Harman, MD;  Location: [facility] OR;  Service: Vascular;  Laterality: Left;    Social History   Socioeconomic History   Marital status: Married    Spouse name: Not on file   Number of children: Not on file   Years of education: Not on file   Highest education level: Not on file  Occupational History   Not on file  Tobacco Use   Smoking status: Never   Smokeless tobacco: Never  Vaping Use   Vaping status: Never Used  Substance and Sexual Activity   Alcohol use: Never   Drug use: Never   Sexual activity: Not Currently  Other Topics Concern   Not on file  Social History Narrative   Not on file   Social Determinants of Health   Financial Resource Strain: Not on file  Food Insecurity: No Food Insecurity (04/25/2022)   Hunger Vital Sign    Worried About Running Out of Food in the Last Year: Never true    Ran Out of Food in the Last Year: Never true  Transportation Needs: No Transportation Needs  (04/25/2022)   PRAPARE - Administrator, Civil Service (Medical): No    Lack of Transportation (Non-Medical): No  Physical Activity: Inactive (09/18/2021)   [facility]   Exercise Vital Sign    Days of Exercise per Week: 0 days    Minutes of Exercise per Session: 0 min  Stress: Not on file  Social Connections: Not on file  Intimate Partner Violence: Not on file    Family History  Problem Relation Age of Onset   Diabetes Mellitus II Sister    Diabetes Sister    Stroke Brother    Heart attack Brother      Review of Systems  Constitutional: Negative.  Negative for chills and fever.  HENT: Negative.  Negative for congestion and sore throat.   Respiratory: Negative.  Negative for cough and shortness of breath.   Cardiovascular: Negative.  Negative for chest pain and palpitations.  Gastrointestinal:  Negative for abdominal pain, diarrhea, nausea and vomiting.  Genitourinary: Negative.   Skin: Negative.  Negative for rash.  Neurological: Negative.  Negative for dizziness and headaches.  All other systems reviewed and are negative.   Vitals:   03/26/23 1316  BP: 128/74  Pulse: 77  Temp: 97.8 F (36.6 C)  SpO2: 96%   Wt Readings from Last 3 Encounters:  03/26/23 150 lb 4 oz (68.2 kg)  05/30/22 142 lb (64.4 kg)  03/05/22 144 lb 4 oz (65.4 kg)     Physical Exam Vitals reviewed.  Constitutional:      Appearance: Normal appearance.  HENT:     Head: Normocephalic.     Mouth/Throat:     Mouth: Mucous membranes are moist.     Pharynx: Oropharynx is clear.     Comments: Edentulous Eyes:     Extraocular Movements: Extraocular movements intact.     Pupils: Pupils are equal, round, and reactive to light.  Cardiovascular:     Rate and Rhythm: Normal rate and regular rhythm.     Pulses: Normal pulses.     Heart sounds: Normal heart sounds.  Pulmonary:     Effort: Pulmonary effort is normal.      Breath sounds: Normal breath sounds.  Abdominal:     Palpations: Abdomen is soft.     Tenderness: There is no abdominal tenderness.  Musculoskeletal:  Cervical back: No tenderness.  Lymphadenopathy:     Cervical: No cervical adenopathy.  Skin:    General: Skin is warm and dry.  Neurological:     General: No focal deficit present.     Mental Status: He is alert and oriented to person, place, and time.  Psychiatric:        Mood and Affect: Mood normal.        Behavior: Behavior normal.      ASSESSMENT & PLAN: A total of 47 minutes was spent with the patient and counseling/coordination of care regarding preparing for this visit, review of most recent office visit notes, review of most recent blood work results, review of multiple chronic medical conditions under management, review of all medications and changes made, education on nutrition, prognosis, documentation, and need for follow-up.  Problem List Items Addressed This Visit       Cardiovascular and Mediastinum   Chronic systolic CHF (congestive heart failure) (HCC)    Clinically euvolemic. No signs of acute CHF. Stable.  No concerns.        Endocrine   Type II diabetes mellitus with renal manifestations (HCC) - Primary (Chronic)    Last hemoglobin A1c 10.4 last month.  Insulin-dependent Recommend to increase daily Tresiba insulin to 20 units and continue using Premeal regular insulin as per sliding scale Hypoglycemia precautions given      Diabetic foot [facility])    Recommend podiatry evaluation.  Referral placed today      Relevant Orders   Ambulatory referral to Podiatry     Other   History of liver transplant [facility])    Doing well.  Stable. Continues immunosuppressive treatment Followed at [facility]      History of kidney transplant    Stable.  Continues immunosuppressive treatment Follows up at [facility]      Edentulous   Relevant Orders   Ambulatory referral  to Oral Maxillofacial Surgery   Patient Instructions  Diabetes Mellitus and Nutrition, Adult When you have diabetes, or diabetes mellitus, it is very important to have healthy eating habits because your blood sugar (glucose) levels are greatly affected by what you eat and drink. Eating healthy foods in the right amounts, at about the same times every day, can help you: Manage your blood glucose. Lower your risk of heart disease. Improve your blood pressure. Reach or maintain a healthy weight. What can affect my meal plan? Every person with diabetes is different, and each person has different needs for a meal plan. Your health care provider may recommend that you work with a dietitian to make a meal plan that is best for you. Your meal plan may vary depending on factors such as: The calories you need. The medicines you take. Your weight. Your blood glucose, blood pressure, and cholesterol levels. Your activity level. Other health conditions you have, such as heart or kidney disease. How do carbohydrates affect me? Carbohydrates, also called carbs, affect your blood glucose level more than any other type of food. Eating carbs raises the amount of glucose in your blood. It is important to know how many carbs you can safely have in each meal. This is different for every person. Your dietitian can help you calculate how many carbs you should have at each meal and for each snack. How does alcohol affect me? Alcohol can cause a decrease in blood glucose (hypoglycemia), especially if you use insulin or take certain diabetes medicines by mouth. Hypoglycemia can be  a life-threatening condition. Symptoms of hypoglycemia, such as sleepiness, dizziness, and confusion, are similar to symptoms of having too much alcohol. Do not drink alcohol if: Your health care provider tells you not to drink. You are pregnant, may be pregnant, or are planning to become pregnant. If you drink alcohol: Limit how much you  have to: 0-1 drink a day for women. 0-2 drinks a day for men. Know how much alcohol is in your drink. In the [place] one drink equals one 12 oz bottle of beer (355 mL), one 5 oz glass of wine (148 mL), or one 1 oz glass of hard liquor (44 mL). Keep yourself hydrated with water, diet soda, or unsweetened iced tea. Keep in mind that regular soda, juice, and other mixers may contain a lot of sugar and must be counted as carbs. What are tips for following this plan?  Reading food labels Start by checking the serving size on the Nutrition Facts label of packaged foods and drinks. The number of calories and the amount of carbs, fats, and other nutrients listed on the label are based on one serving of the item. Many items contain more than one serving per package. Check the total grams (g) of carbs in one serving. Check the number of grams of saturated fats and trans fats in one serving. Choose foods that have a low amount or none of these fats. Check the number of milligrams (mg) of salt (sodium) in one serving. Most people should limit total sodium intake to less than 2,300 mg per day. Always check the nutrition information of foods labeled as "low-fat" or "nonfat." These foods may be higher in added sugar or refined carbs and should be avoided. Talk to your dietitian to identify your daily goals for nutrients listed on the label. Shopping Avoid buying canned, pre-made, or processed foods. These foods tend to be high in fat, sodium, and added sugar. Shop around the outside edge of the grocery store. This is where you will most often find fresh fruits and vegetables, bulk grains, fresh meats, and fresh dairy products. Cooking Use low-heat cooking methods, such as baking, instead of high-heat cooking methods, such as deep frying. Cook using healthy oils, such as olive, canola, or sunflower oil. Avoid cooking with butter, cream, or high-fat meats. Meal planning Eat meals and snacks regularly, preferably  at the same times every day. Avoid going long periods of time without eating. Eat foods that are high in fiber, such as fresh fruits, vegetables, beans, and whole grains. Eat 4-6 oz (112-168 g) of lean protein each day, such as lean meat, chicken, fish, eggs, or tofu. One ounce (oz) (28 g) of lean protein is equal to: 1 oz (28 g) of meat, chicken, or fish. 1 egg.  cup (62 g) of tofu. Eat some foods each day that contain healthy fats, such as avocado, nuts, seeds, and fish. What foods should I eat? Fruits Berries. Apples. Oranges. Peaches. Apricots. Plums. Grapes. Mangoes. Papayas. Pomegranates. Kiwi. Cherries. Vegetables Leafy greens, including lettuce, spinach, kale, chard, collard greens, mustard greens, and cabbage. Beets. Cauliflower. Broccoli. Carrots. Green beans. Tomatoes. Peppers. Onions. Cucumbers. Brussels sprouts. Grains Whole grains, such as whole-wheat or whole-grain bread, crackers, tortillas, cereal, and pasta. Unsweetened oatmeal. Quinoa. Brown or wild rice. Meats and other proteins Seafood. Poultry without skin. Lean cuts of poultry and beef. Tofu. Nuts. Seeds. Dairy Low-fat or fat-free dairy products such as milk, yogurt, and cheese. The items listed above may not be a complete list of  foods and beverages you can eat and drink. Contact a dietitian for more information. What foods should I avoid? Fruits Fruits canned with syrup. Vegetables Canned vegetables. Frozen vegetables with butter or cream sauce. Grains Refined white flour and flour products such as bread, pasta, snack foods, and cereals. Avoid all processed foods. Meats and other proteins Fatty cuts of meat. Poultry with skin. Breaded or fried meats. Processed meat. Avoid saturated fats. Dairy Full-fat yogurt, cheese, or milk. Beverages Sweetened drinks, such as soda or iced tea. The items listed above may not be a complete list of foods and beverages you should avoid. Contact a dietitian for more  information. Questions to ask a health care provider Do I need to meet with a certified diabetes care and education specialist? Do I need to meet with a dietitian? What number can I call if I have questions? When are the best times to check my blood glucose? Where to find more information: American Diabetes Association: diabetes.org Academy of Nutrition and Dietetics: eatright.[employer] of Diabetes and Digestive and Kidney Diseases: StageSync.si Association of Diabetes Care & Education Specialists: diabeteseducator.org Summary It is important to have healthy eating habits because your blood sugar (glucose) levels are greatly affected by what you eat and drink. It is important to use alcohol carefully. A healthy meal plan will help you manage your blood glucose and lower your risk of heart disease. Your health care provider may recommend that you work with a dietitian to make a meal plan that is best for you. This information is not intended to replace advice given to you by your health care provider. Make sure you discuss any questions you have with your health care provider. Document Revised: 02/23/2020 Document Reviewed: 02/23/2020 Elsevier Patient Education  2024 Elsevier Inc.      Edwina Barth, MD [facility]

## 2023-03-26 NOTE — Patient Instructions (Signed)

## 2023-03-26 NOTE — Assessment & Plan Note (Signed)
Clinically euvolemic. No signs of acute CHF. Stable.  No concerns.

## 2023-03-26 NOTE — Assessment & Plan Note (Signed)
Stable.  Continues immunosuppressive treatment Follows up at [facility]

## 2023-06-02 ENCOUNTER — Ambulatory Visit (INDEPENDENT_AMBULATORY_CARE_PROVIDER_SITE_OTHER): Payer: Medicare (Managed Care) | Admitting: Emergency Medicine

## 2023-06-02 ENCOUNTER — Encounter: Payer: Self-pay | Admitting: Emergency Medicine

## 2023-06-02 VITALS — BP 128/88 | HR 85 | Temp 97.5°F | Ht 65.0 in | Wt 154.4 lb

## 2023-06-02 DIAGNOSIS — Z944 Liver transplant status: Secondary | ICD-10-CM

## 2023-06-02 DIAGNOSIS — E1121 Type 2 diabetes mellitus with diabetic nephropathy: Secondary | ICD-10-CM

## 2023-06-02 DIAGNOSIS — I10 Essential (primary) hypertension: Secondary | ICD-10-CM | POA: Diagnosis not present

## 2023-06-02 DIAGNOSIS — Z940 Kidney transplant status: Secondary | ICD-10-CM

## 2023-06-02 DIAGNOSIS — Z794 Long term (current) use of insulin: Secondary | ICD-10-CM | POA: Diagnosis not present

## 2023-06-02 DIAGNOSIS — R600 Localized edema: Secondary | ICD-10-CM | POA: Diagnosis not present

## 2023-06-02 DIAGNOSIS — I5022 Chronic systolic (congestive) heart failure: Secondary | ICD-10-CM

## 2023-06-02 LAB — POCT GLYCOSYLATED HEMOGLOBIN (HGB A1C): Hemoglobin A1C: 9.2 % — AB (ref 4.0–5.6)

## 2023-06-02 NOTE — Patient Instructions (Signed)
Health Maintenance After Age 71 After age 71, you are at a higher risk for certain long-term diseases and infections as well as injuries from falls. Falls are a major cause of broken bones and head injuries in people who are older than age 71. Getting regular preventive care can help to keep you healthy and well. Preventive care includes getting regular testing and making lifestyle changes as recommended by your health care provider. Talk with your health care provider about: Which screenings and tests you should have. A screening is a test that checks for a disease when you have no symptoms. A diet and exercise plan that is right for you. What should I know about screenings and tests to prevent falls? Screening and testing are the best ways to find a health problem early. Early diagnosis and treatment give you the best chance of managing medical conditions that are common after age 71. Certain conditions and lifestyle choices may make you more likely to have a fall. Your health care provider may recommend: Regular vision checks. Poor vision and conditions such as cataracts can make you more likely to have a fall. If you wear glasses, make sure to get your prescription updated if your vision changes. Medicine review. Work with your health care provider to regularly review all of the medicines you are taking, including over-the-counter medicines. Ask your health care provider about any side effects that may make you more likely to have a fall. Tell your health care provider if any medicines that you take make you feel dizzy or sleepy. Strength and balance checks. Your health care provider may recommend certain tests to check your strength and balance while standing, walking, or changing positions. Foot health exam. Foot pain and numbness, as well as not wearing proper footwear, can make you more likely to have a fall. Screenings, including: Osteoporosis screening. Osteoporosis is a condition that causes  the bones to get weaker and break more easily. Blood pressure screening. Blood pressure changes and medicines to control blood pressure can make you feel dizzy. Depression screening. You may be more likely to have a fall if you have a fear of falling, feel depressed, or feel unable to do activities that you used to do. Alcohol use screening. Using too much alcohol can affect your balance and may make you more likely to have a fall. Follow these instructions at home: Lifestyle Do not drink alcohol if: Your health care provider tells you not to drink. If you drink alcohol: Limit how much you have to: 0-1 drink a day for women. 0-2 drinks a day for men. Know how much alcohol is in your drink. In the [place] one drink equals one 12 oz bottle of beer (355 mL), one 5 oz glass of wine (148 mL), or one 1 oz glass of hard liquor (44 mL). Do not use any products that contain nicotine or tobacco. These products include cigarettes, chewing tobacco, and vaping devices, such as e-cigarettes. If you need help quitting, ask your health care provider. Activity  Follow a regular exercise program to stay fit. This will help you maintain your balance. Ask your health care provider what types of exercise are appropriate for you. If you need a cane or walker, use it as recommended by your health care provider. Wear supportive shoes that have nonskid soles. Safety  Remove any tripping hazards, such as rugs, cords, and clutter. Install safety equipment such as grab bars in bathrooms and safety rails on stairs. Keep rooms and walkways   well-lit. General instructions Talk with your health care provider about your risks for falling. Tell your health care provider if: You fall. Be sure to tell your health care provider about all falls, even ones that seem minor. You feel dizzy, tiredness (fatigue), or off-balance. Take over-the-counter and prescription medicines only as told by your health care provider. These include  supplements. Eat a healthy diet and maintain a healthy weight. A healthy diet includes low-fat dairy products, low-fat (lean) meats, and fiber from whole grains, beans, and lots of fruits and vegetables. Stay current with your vaccines. Schedule regular health, dental, and eye exams. Summary Having a healthy lifestyle and getting preventive care can help to protect your health and wellness after age 71. Screening and testing are the best way to find a health problem early and help you avoid having a fall. Early diagnosis and treatment give you the best chance for managing medical conditions that are more common for people who are older than age 71. Falls are a major cause of broken bones and head injuries in people who are older than age 71. Take precautions to prevent a fall at home. Work with your health care provider to learn what changes you can make to improve your health and wellness and to prevent falls. This information is not intended to replace advice given to you by your health care provider. Make sure you discuss any questions you have with your health care provider. Document Revised: 12/11/2020 Document Reviewed: 12/11/2020 [employer] Patient Education  2024 [employer]  

## 2023-06-02 NOTE — Assessment & Plan Note (Signed)
Currently euvolemic No clinical findings of congestive heart failure Stable and well-controlled

## 2023-06-02 NOTE — Assessment & Plan Note (Signed)
BP Readings from Last 3 Encounters:  06/02/23 128/88  03/26/23 128/74  10/10/22 (!) 155/85  Well-controlled hypertension off medication

## 2023-06-02 NOTE — Assessment & Plan Note (Signed)
Mild edema.  No concerns. Leg elevation recommended Compression socks suggested

## 2023-06-02 NOTE — Assessment & Plan Note (Signed)
Hemoglobin A1c at 9.2 better than before. Continues Premeal regular insulin Continues Tresiba at 6 units. Advised to increase Tresiba 2 units at a time for target fasting morning blood sugar of 100.

## 2023-06-02 NOTE — Progress Notes (Signed)
William [name] 72 y.o.   Chief Complaint  Patient presents with   Foot Swelling    Patient states his feet has been swelling for about 2 weeks. Both feet are swelling    HISTORY OF PRESENT ILLNESS: This is a 72 y.o. male accompanied by son complaining of intermittent lower leg mostly feet swelling for the past 2 weeks History of liver and kidney transplant Diabetic on insulin No other complaints or medical concerns today. Lab Results  Component Value Date   HGBA1C 9.3 (A) 03/05/2022     HPI   Prior to Admission medications   Medication Sig Start Date End Date Taking? Authorizing Provider  acetaminophen (TYLENOL) 650 MG CR tablet Take 650 mg by mouth every 8 (eight) hours as needed for pain.   Yes [provider]  Calcium Citrate-Vitamin D 315-5 MG-MCG TABS Take by mouth. 10/03/21  Yes [provider]  GENGRAF 100 MG capsule Take 100 mg by mouth 2 (two) times daily. 02/26/22  Yes [provider]  GENGRAF 25 MG capsule Take by mouth. 02/26/22  Yes [provider]  insulin degludec (TRESIBA FLEXTOUCH) 100 UNIT/ML FlexTouch Pen Inject 14 Units into the skin daily. 03/19/23  Yes Armarion Greek, William Kempf, MD  Insulin Pen Needle (ADVOCATE INSULIN PEN NEEDLES) 31G X 5 MM MISC Use four times daily to inject insulin 04/27/18  Yes William Littler, MD  insulin regular (NOVOLIN R,HUMULIN R) 100 units/mL injection Inject 6-8 Units into the skin See admin instructions. Inject 8 units subcutaneously with breakfast, and 6 units with lunch and supper Sliding scale   Yes [provider]  magnesium oxide (MAG-OX) 400 MG tablet Take by mouth. 02/26/22  Yes [provider]  Multiple Vitamin (MULTIVITAMIN WITH MINERALS) TABS tablet Take 1 tablet by mouth daily.   Yes [provider]  mupirocin ointment (BACTROBAN) 2 % Apply 1 application topically daily. 05/30/21  Yes Avie Checo, William Kempf, MD  mycophenolate (CELLCEPT) 250 MG capsule Take 500 mg by  mouth 2 (two) times daily. 02/26/22  Yes [provider]  ondansetron (ZOFRAN ODT) 4 MG disintegrating tablet Take 1 tablet (4 mg total) by mouth 2 (two) times daily as needed for nausea or vomiting. 05/01/20  Yes [name], [name], PA-C  ONE TOUCH LANCETS MISC Use to test blood sugar three times daily 04/27/18  Yes William Littler, MD  [facility] VERIO test strip USE 1 STRIP TO CHECK GLUCOSE THREE TIMES DAILY 07/23/21  Yes William Littler, MD  predniSONE (DELTASONE) 5 MG tablet Take by mouth. 02/15/22  Yes [provider]  calcium acetate (PHOSLO) 667 MG tablet Take 667 mg by mouth 3 (three) times daily. Patient not taking: Reported on 10/08/2022 05/09/21   [provider]  cephALEXin (KEFLEX) 500 MG capsule Take 1 capsule (500 mg total) by mouth 4 (four) times daily. Patient not taking: Reported on 06/02/2023 10/02/22   William Eagle, PA-C  cycloSPORINE modified (NEORAL) 100 MG capsule Take 125mg  (one of the 100mg  capsules along with one of the 25mg  capsules) in the morning AND Take 125 mg (one of the 100mg  capsules along with one of the 25mg  capsules) in the evening, by mouth. Patient not taking: Reported on 06/02/2023 01/17/22   [provider]  cycloSPORINE modified (NEORAL) 25 MG capsule Take 125mg  (one of the 100mg  capsules along with one of the 25mg  capsules) in the morning AND Take 125mg  (one of the 100mg  capsules along with one of the 25mg  capsules) in the evening, by  mouth. Patient not taking: Reported on 06/02/2023 01/17/22   [provider]  lactulose (CHRONULAC) 10 GM/15ML solution Take 45 mLs (30 g total) by mouth 3 (three) times daily. Patient not taking: Reported on 03/26/2023 06/14/21   William Quint, MD  lidocaine-prilocaine (EMLA) cream Apply 1 application topically See admin instructions. Apply topically prior to dialysis on Tuesday, Thursday, Saturday Patient not taking: Reported on 10/08/2022 08/24/18   [provider]   oxyCODONE-acetaminophen (PERCOCET) 5-325 MG tablet Take 1 tablet by mouth every 6 (six) hours as needed. Patient not taking: Reported on 03/26/2023 07/27/21   William Lords, PA-C  rifaximin (XIFAXAN) 550 MG TABS tablet Take 1 tablet (550 mg total) by mouth 2 (two) times daily. Patient not taking: Reported on 03/26/2023 07/07/20   William Niece, MD  rosuvastatin (CRESTOR) 5 MG tablet Take 1 tablet (5 mg total) by mouth daily. Patient not taking: Reported on 03/26/2023 10/18/20   William Littler, MD  sevelamer carbonate (RENVELA) 800 MG tablet Take 800 mg by mouth 3 (three) times daily. 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   Elevated PSA 12/14/2020   Repeated falls 11/13/2020   Coagulation defect, unspecified (HCC) 11/06/2020   Other microscopic hematuria 11/03/2020   Traumatic amputation of third toe of left foot (HCC) 10/15/2020   Diabetic foot infection (HCC) 10/06/2020   Thrombotic microangiopathy, 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   Physical deconditioning 04/27/2020   History of liver transplant (HCC) 04/27/2020   Presbycusis of both ears 09/24/2019   Chronic systolic CHF (congestive heart failure) (HCC)    Esophageal varices (HCC) 09/23/2018   Thrombocytopenia (HCC) 09/23/2018   CHF (congestive heart failure) (HCC) 09/22/2018   Mild protein-calorie malnutrition (HCC) 02/04/2018   Anemia in chronic kidney disease 01/22/2018   Iron deficiency anemia, unspecified 01/22/2018   Secondary hyperparathyroidism of renal origin  (HCC) 01/22/2018   CKD (chronic kidney disease) stage 4, GFR 15-29 ml/min (HCC) 12/19/2017   Cirrhosis of liver with ascites (HCC) 12/09/2017   Goodpasture's syndrome (HCC) with associated hemoptysis 11/20/2017   Pancytopenia (HCC) 11/06/2017   Hypertension 11/06/2017   Type II diabetes mellitus with renal manifestations (HCC) 08/25/2015    Past Medical History:  Diagnosis Date   Anemia    Blind one eye    CHF (congestive heart failure) (HCC)    Cirrhosis (HCC)    ESRD on hemodialysis (HCC)    T, Th, Sat. [place]   Goodpasture's disease [facility])    on outpatient plasmapheresis/notes 11/20/2017   Grade I diastolic dysfunction 10/14/2017   History of anemia due to chronic kidney disease    History of blood transfusion X 1   "UGIB; low blood count"   History of plasmapheresis    "qod" (11/20/2017)   Hypertension    Pancytopenia (HCC) 08/20/2017   Type II diabetes mellitus [facility])     Past Surgical History:  Procedure Laterality Date   A/V FISTULAGRAM Left 10/16/2020   Procedure: A/V FISTULAGRAM;  Surgeon: Maeola Harman, MD;  Location: [facility];  Service: Cardiovascular;  Laterality: Left;   AMPUTATION TOE Left 10/07/2020   Procedure: AMPUTATION THIRD TOE;  Surgeon: Edwin Cap, DPM;  Location: [facility] OR;  Service: Podiatry;  Laterality: Left;   AV FISTULA PLACEMENT Left 12/26/2017   Procedure: LEFT BRACHIOCEPHALIC ARTERIOVENOUS (AV) FISTULA CREATION;  Surgeon: Fransisco Hertz, MD;  Location: [facility] OR;  Service: Vascular;  Laterality: Left;   AV FISTULA PLACEMENT Left 11/02/2020   Procedure: INSERTION OF ARTERIOVENOUS (AV) GORE-TEX GRAFT ARM;  Surgeon: Maeola Harman, MD;  Location: [facility] OR;  Service: Vascular;  Laterality: Left;   AV FISTULA PLACEMENT Right 06/01/2021   Procedure: RIGHT ARM BRACHIOBASILIC ARTERIOVENOUS (AV) FISTULA CREATION;  Surgeon: Maeola Harman, MD;  Location: [facility] OR;  Service: Vascular;  Laterality: Right;   BASCILIC VEIN  TRANSPOSITION Left 02/16/2018   Procedure: BRACHIOBASILIC VEIN TRANSPOSITION SECOND STAGE;  Surgeon: Fransisco Hertz, MD;  Location: [facility] OR;  Service: Vascular;  Laterality: Left;   BASCILIC VEIN TRANSPOSITION Right 07/27/2021   Procedure: SECOND STAGE RIGHT ARM BASILIC VEIN TRANSPOSITION;  Surgeon: Leonie Douglas, MD;  Location: [facility] OR;  Service: Vascular;  Laterality: Right;   CATARACT EXTRACTION W/ INTRAOCULAR LENS  IMPLANT, BILATERAL Bilateral    ESOPHAGEAL BANDING N/A 04/01/2018   Procedure: ESOPHAGEAL BANDING;  Surgeon: Kathi Der, MD;  Location: [facility] ENDOSCOPY;  Service: Gastroenterology;  Laterality: N/A;   ESOPHAGEAL BANDING N/A 06/29/2018   Procedure: ESOPHAGEAL BANDING;  Surgeon: Kathi Der, MD;  Location: [facility]OS[facility];  Service: Gastroenterology;  Laterality: N/A;   ESOPHAGOGASTRODUODENOSCOPY (EGD) WITH PROPOFOL N/A 04/01/2018   Procedure: ESOPHAGOGASTRODUODENOSCOPY (EGD) WITH PROPOFOL;  Surgeon: Kathi Der, MD;  Location: [facility];  Service: Gastroenterology;  Laterality: N/A;   ESOPHAGOGASTRODUODENOSCOPY (EGD) WITH PROPOFOL N/A 06/29/2018   Procedure: ESOPHAGOGASTRODUODENOSCOPY (EGD) WITH PROPOFOL;  Surgeon: Kathi Der, MD;  Location: [facility];  Service: Gastroenterology;  Laterality: N/A;   ESOPHAGOGASTRODUODENOSCOPY (EGD) WITH PROPOFOL N/A 09/30/2018   Procedure: ESOPHAGOGASTRODUODENOSCOPY (EGD) WITH PROPOFOL;  Surgeon: Kathi Der, MD;  Location: [facility];  Service: Gastroenterology;  Laterality: N/A;   ESOPHAGOGASTRODUODENOSCOPY (EGD) WITH PROPOFOL N/A 04/20/2019   Procedure: ESOPHAGOGASTRODUODENOSCOPY (EGD) WITH PROPOFOL;  Surgeon: Kathi Der, MD;  Location: [facility];  Service: Gastroenterology;  Laterality: N/A;   ESOPHAGOGASTRODUODENOSCOPY (EGD) WITH PROPOFOL N/A 04/17/2020   Procedure: ESOPHAGOGASTRODUODENOSCOPY (EGD) WITH PROPOFOL;  Surgeon: Graylin Shiver, MD;  Location: [facility] ENDOSCOPY;  Service: Gastroenterology;  Laterality:  N/A;   I & D EXTREMITY Left 02/18/2018   Procedure: IRRIGATION AND DEBRIDEMENT LEFT ARM;  Surgeon: Nada Libman, MD;  Location: [facility] OR;  Service: Vascular;  Laterality: Left;   INSERTION OF DIALYSIS CATHETER N/A 01/19/2018   Procedure: INSERTION OF DIALYSIS CATHETER - RIGHT INTERNAL JUGULAR PLACEMENT;  Surgeon: Larina Earthly, MD;  Location: [facility] OR;  Service: Vascular;  Laterality: N/A;   INSERTION OF DIALYSIS CATHETER Right 11/02/2020   Procedure: INSERTION OF TUNNELED DIALYSIS CATHETER;  Surgeon: Maeola Harman, MD;  Location: [facility] OR;  Service: Vascular;  Laterality: Right;   IR FLUORO GUIDE CV LINE RIGHT  11/10/2017   IR PARACENTESIS  12/10/2017   IR PARACENTESIS  12/26/2017   IR REMOVAL TUN CV CATH W/O FL  11/28/2017   IR THROMBECTOMY AV FISTULA W/THROMBOLYSIS/PTA INC/SHUNT/IMG LEFT Left 06/02/2019   IR US GUIDE VASC ACCESS LEFT  06/02/2019   IR US GUIDE VASC ACCESS RIGHT  11/10/2017   NECK SURGERY  2007   "back of neck; no hardware in there"   REVISON  OF ARTERIOVENOUS FISTULA Left 11/02/2020   Procedure: REVISON OF ARTERIOVENOUS FISTULA;  Surgeon: Maeola Harman, MD;  Location: [facility] OR;  Service: Vascular;  Laterality: Left;    Social History   Socioeconomic History   Marital status: Married    Spouse name: Not on file   Number of children: Not on file   Years of education: Not on file   Highest education level: Not on file  Occupational History   Not on file  Tobacco Use   Smoking status: Never   Smokeless tobacco: Never  Vaping Use   Vaping status: Never Used  Substance and Sexual Activity   Alcohol use: Never   Drug use: Never   Sexual activity: Not Currently  Other Topics Concern   Not on file  Social History Narrative   Not on file   Social Determinants of Health   Financial Resource Strain: Not on file  Food Insecurity: No Food Insecurity (04/25/2022)   Hunger Vital Sign    Worried About Running Out of Food in the Last Year: Never true    Ran Out of  Food in the Last Year: Never true  Transportation Needs: No Transportation Needs (04/25/2022)   PRAPARE - Administrator, Civil Service (Medical): No    Lack of Transportation (Non-Medical): No  Physical Activity: Inactive (09/18/2021)   [facility]   Exercise Vital Sign    Days of Exercise per Week: 0 days    Minutes of Exercise per Session: 0 min  Stress: Not on file  Social Connections: Not on file  Intimate Partner Violence: Not on file    Family History  Problem Relation Age of Onset   Diabetes Mellitus II Sister    Diabetes Sister    Stroke Brother    Heart attack Brother      Review of Systems  Constitutional: Negative.  Negative for chills and fever.  HENT: Negative.  Negative for congestion and sore throat.   Respiratory: Negative.  Negative for cough and shortness of breath.   Cardiovascular: Negative.  Negative for chest pain and palpitations.  Gastrointestinal:  Negative for abdominal pain, diarrhea, nausea and vomiting.  Genitourinary: Negative.  Negative for dysuria and hematuria.  Skin: Negative.  Negative for rash.  Neurological: Negative.  Negative for dizziness and headaches.  All other systems reviewed and are negative.   Vitals:   06/02/23 0957  BP: 128/88  Pulse: 85  Temp: (!) 97.5 F (36.4 C)  SpO2: 97%    Physical Exam Vitals reviewed.  Constitutional:      Appearance: Normal appearance.  HENT:     Head: Normocephalic.  Eyes:     Extraocular Movements: Extraocular movements intact.  Cardiovascular:     Rate and Rhythm: Normal rate and regular rhythm.     Pulses: Normal pulses.     Heart sounds: Normal heart sounds.  Pulmonary:     Effort: Pulmonary effort is normal.     Breath sounds: Normal breath sounds.  Abdominal:     Palpations: Abdomen is soft.     Tenderness: There is no abdominal tenderness.  Musculoskeletal:     Comments: No ankle edema.  Mild edema  both feet  Skin:    General: Skin is warm and dry.  Neurological:     General: No focal deficit present.     Mental Status: He is alert and oriented to person, place, and time.  Psychiatric:  Mood and Affect: Mood normal.        Behavior: Behavior normal.    Results for orders placed or performed in visit on 06/02/23 (from the past 24 hour(s))  POCT HgB A1C     Status: Abnormal   Collection Time: 06/02/23 11:10 AM  Result Value Ref Range   Hemoglobin A1C 9.2 (A) 4.0 - 5.6 %   HbA1c POC (<> result, manual entry)     HbA1c, POC (prediabetic range)     HbA1c, POC (controlled diabetic range)       ASSESSMENT & PLAN: A total of 42 minutes was spent with the patient and counseling/coordination of care regarding preparing for this visit, review of most recent office visit notes, review of multiple chronic medical conditions under management, review of most recent blood work results including interpretation of today's hemoglobin A1c, review of all medications and changes made, education on nutrition, prognosis, documentation, and need for follow-up.  Problem List Items Addressed This Visit       Cardiovascular and Mediastinum   Hypertension    BP Readings from Last 3 Encounters:  06/02/23 128/88  03/26/23 128/74  10/10/22 (!) 155/85  Well-controlled hypertension off medication       Chronic systolic CHF (congestive heart failure) (HCC)    Currently euvolemic No clinical findings of congestive heart failure Stable and well-controlled        Endocrine   Type II diabetes mellitus with renal manifestations (HCC) - Primary (Chronic)    Hemoglobin A1c at 9.2 better than before. Continues Premeal regular insulin Continues Tresiba at 6 units. Advised to increase Tresiba 2 units at a time for target fasting morning blood sugar of 100.       Relevant Orders   POCT HgB A1C (Completed)     Other   Edema of both feet    Mild edema.  No concerns. Leg elevation  recommended Compression socks suggested      History of liver transplant [facility])   History of kidney transplant   Patient Instructions  Health Maintenance After Age 74 After age 102, you are at a higher risk for certain long-term diseases and infections as well as injuries from falls. Falls are a major cause of broken bones and head injuries in people who are older than age 75. Getting regular preventive care can help to keep you healthy and well. Preventive care includes getting regular testing and making lifestyle changes as recommended by your health care provider. Talk with your health care provider about: Which screenings and tests you should have. A screening is a test that checks for a disease when you have no symptoms. A diet and exercise plan that is right for you. What should I know about screenings and tests to prevent falls? Screening and testing are the best ways to find a health problem early. Early diagnosis and treatment give you the best chance of managing medical conditions that are common after age 55. Certain conditions and lifestyle choices may make you more likely to have a fall. Your health care provider may recommend: Regular vision checks. Poor vision and conditions such as cataracts can make you more likely to have a fall. If you wear glasses, make sure to get your prescription updated if your vision changes. Medicine review. Work with your health care provider to regularly review all of the medicines you are taking, including over-the-counter medicines. Ask your health care provider about any side effects that may make you more likely to have a  fall. Tell your health care provider if any medicines that you take make you feel dizzy or sleepy. Strength and balance checks. Your health care provider may recommend certain tests to check your strength and balance while standing, walking, or changing positions. Foot health exam. Foot pain and numbness, as well as not wearing proper  footwear, can make you more likely to have a fall. Screenings, including: Osteoporosis screening. Osteoporosis is a condition that causes the bones to get weaker and break more easily. Blood pressure screening. Blood pressure changes and medicines to control blood pressure can make you feel dizzy. Depression screening. You may be more likely to have a fall if you have a fear of falling, feel depressed, or feel unable to do activities that you used to do. Alcohol use screening. Using too much alcohol can affect your balance and may make you more likely to have a fall. Follow these instructions at home: Lifestyle Do not drink alcohol if: Your health care provider tells you not to drink. If you drink alcohol: Limit how much you have to: 0-1 drink a day for women. 0-2 drinks a day for men. Know how much alcohol is in your drink. In the [place] one drink equals one 12 oz bottle of beer (355 mL), one 5 oz glass of wine (148 mL), or one 1 oz glass of hard liquor (44 mL). Do not use any products that contain nicotine or tobacco. These products include cigarettes, chewing tobacco, and vaping devices, such as e-cigarettes. If you need help quitting, ask your health care provider. Activity  Follow a regular exercise program to stay fit. This will help you maintain your balance. Ask your health care provider what types of exercise are appropriate for you. If you need a cane or walker, use it as recommended by your health care provider. Wear supportive shoes that have nonskid soles. Safety  Remove any tripping hazards, such as rugs, cords, and clutter. Install safety equipment such as grab bars in bathrooms and safety rails on stairs. Keep rooms and walkways well-lit. General instructions Talk with your health care provider about your risks for falling. Tell your health care provider if: You fall. Be sure to tell your health care provider about all falls, even ones that seem minor. You feel dizzy,  tiredness (fatigue), or off-balance. Take over-the-counter and prescription medicines only as told by your health care provider. These include supplements. Eat a healthy diet and maintain a healthy weight. A healthy diet includes low-fat dairy products, low-fat (lean) meats, and fiber from whole grains, beans, and lots of fruits and vegetables. Stay current with your vaccines. Schedule regular health, dental, and eye exams. Summary Having a healthy lifestyle and getting preventive care can help to protect your health and wellness after age 16. Screening and testing are the best way to find a health problem early and help you avoid having a fall. Early diagnosis and treatment give you the best chance for managing medical conditions that are more common for people who are older than age 22. Falls are a major cause of broken bones and head injuries in people who are older than age 64. Take precautions to prevent a fall at home. Work with your health care provider to learn what changes you can make to improve your health and wellness and to prevent falls. This information is not intended to replace advice given to you by your health care provider. Make sure you discuss any questions you have with your health care provider.  Document Revised: 12/11/2020 Document Reviewed: 12/11/2020 Elsevier Patient Education  2024 Elsevier Inc.    Edwina Barth, MD [facility]

## 2023-06-25 ENCOUNTER — Other Ambulatory Visit: Payer: Self-pay | Admitting: Radiology

## 2023-06-25 ENCOUNTER — Telehealth: Payer: Self-pay | Admitting: Emergency Medicine

## 2023-06-25 DIAGNOSIS — Z794 Long term (current) use of insulin: Secondary | ICD-10-CM

## 2023-06-25 MED ORDER — TRESIBA FLEXTOUCH 100 UNIT/ML ~~LOC~~ SOPN: 14.0000 [IU] | PEN_INJECTOR | Freq: Every day | SUBCUTANEOUS | 0 refills | Status: DC

## 2023-06-25 NOTE — Telephone Encounter (Signed)
Caller & Relationship to patient: Son  Call back number: [phone]   Date of last office visit: 10.28.24  Date of next office visit: 04.28.25  Medication(s) to be refilled:  insulin degludec (TRESIBA FLEXTOUCH) 100 UNIT/ML FlexTouch Pen   Preferred Pharmacy:  [facility] [phone]    Phone: [phone]  Fax: [phone]   Pt took last dose of medication yesterday and is now completely out.

## 2023-06-25 NOTE — Telephone Encounter (Signed)
Refill sent   Patient notified

## 2023-08-26 ENCOUNTER — Encounter (HOSPITAL_COMMUNITY): Payer: Self-pay

## 2023-08-27 ENCOUNTER — Encounter: Payer: Self-pay | Admitting: Podiatry

## 2023-08-27 ENCOUNTER — Ambulatory Visit: Payer: 59 | Admitting: Podiatry

## 2023-08-27 ENCOUNTER — Ambulatory Visit (INDEPENDENT_AMBULATORY_CARE_PROVIDER_SITE_OTHER): Payer: 59

## 2023-08-27 VITALS — Ht 65.0 in | Wt 154.4 lb

## 2023-08-27 DIAGNOSIS — M79671 Pain in right foot: Secondary | ICD-10-CM | POA: Diagnosis not present

## 2023-08-27 DIAGNOSIS — M79672 Pain in left foot: Secondary | ICD-10-CM

## 2023-08-27 DIAGNOSIS — E0843 Diabetes mellitus due to underlying condition with diabetic autonomic (poly)neuropathy: Secondary | ICD-10-CM

## 2023-08-27 DIAGNOSIS — E119 Type 2 diabetes mellitus without complications: Secondary | ICD-10-CM | POA: Diagnosis not present

## 2023-08-27 DIAGNOSIS — S98132A Complete traumatic amputation of one left lesser toe, initial encounter: Secondary | ICD-10-CM

## 2023-08-27 NOTE — Progress Notes (Signed)
Chief Complaint  Patient presents with   Foot Pain    Pt is here due to swelling in bilateral feet, states his feet has been swelling for the last month, no injury to foot, Pt was seen by PCP for this issue and was told nothing was wrong with feet.    HPI: 73 y.o. male PMHx T2DM, CHF, liver and kidney transplant recipient, prior left third toe amputation presenting today with his son and an interpreter for evaluation of idiopathic moderate swelling to the bilateral lower extremities over the past month.  Patient's son states that they were seen by the PCP about 1 month ago and said his feet were fine.  They present today to have them evaluated  Past Medical History:  Diagnosis Date   Anemia    Blind one eye    CHF (congestive heart failure) (HCC)    Cirrhosis (HCC)    ESRD on hemodialysis (HCC)    T, Th, Sat. [place]   Goodpasture's disease [facility])    on outpatient plasmapheresis/notes 11/20/2017   Grade I diastolic dysfunction 10/14/2017   History of anemia due to chronic kidney disease    History of blood transfusion X 1   "UGIB; low blood count"   History of plasmapheresis    "qod" (11/20/2017)   Hypertension    Pancytopenia (HCC) 08/20/2017   Type II diabetes mellitus [facility])     Past Surgical History:  Procedure Laterality Date   A/V FISTULAGRAM Left 10/16/2020   Procedure: A/V FISTULAGRAM;  Surgeon: Maeola Harman, MD;  Location: [facility] INVASIVE CV LAB;  Service: Cardiovascular;  Laterality: Left;   AMPUTATION TOE Left 10/07/2020   Procedure: AMPUTATION THIRD TOE;  Surgeon: Edwin Cap, DPM;  Location: [facility] OR;  Service: Podiatry;  Laterality: Left;   AV FISTULA PLACEMENT Left 12/26/2017   Procedure: LEFT BRACHIOCEPHALIC ARTERIOVENOUS (AV) FISTULA CREATION;  Surgeon: Fransisco Hertz, MD;  Location: [facility] OR;  Service: Vascular;  Laterality: Left;   AV FISTULA PLACEMENT Left 11/02/2020   Procedure: INSERTION OF ARTERIOVENOUS (AV) GORE-TEX GRAFT ARM;  Surgeon: Maeola Harman, MD;  Location: [facility] OR;  Service: Vascular;  Laterality: Left;   AV FISTULA PLACEMENT Right 06/01/2021   Procedure: RIGHT ARM BRACHIOBASILIC ARTERIOVENOUS (AV) FISTULA CREATION;  Surgeon: Maeola Harman, MD;  Location[facility] OR;  Service: Vascular;  Laterality: Right;   BASCILIC VEIN TRANSPOSITION Left 02/16/2018   Procedure: BRACHIOBASILIC VEIN TRANSPOSITION SECOND STAGE;  Surgeon: Fransisco Hertz, MD;  Location: [facility] OR;  Service: Vascular;  Laterality: Left;   BASCILIC VEIN TRANSPOSITION Right 07/27/2021   Procedure: SECOND STAGE RIGHT ARM BASILIC VEIN TRANSPOSITION;  Surgeon: Leonie Douglas, MD;  Location: [facility] OR;  Service: Vascular;  Laterality: Right;   CATARACT EXTRACTION W/ INTRAOCULAR LENS  IMPLANT, BILATERAL Bilateral    ESOPHAGEAL BANDING N/A 04/01/2018   Procedure: ESOPHAGEAL BANDING;  Surgeon: Kathi Der, MD;  Location: [facility] ENDOSCOPY;  Service: Gastroenterology;  Laterality: N/A;   ESOPHAGEAL BANDING N/A 06/29/2018   Procedure: ESOPHAGEAL BANDING;  Surgeon: Kathi Der, MD;  Location: [facility] ENDOSCOPY;  Service: Gastroenterology;  Laterality: N/A;   ESOPHAGOGASTRODUODENOSCOPY (EGD) WITH PROPOFOL N/A 04/01/2018   Procedure: ESOPHAGOGASTRODUODENOSCOPY (EGD) WITH PROPOFOL;  Surgeon: Kathi Der, MD;  Location: [facility]OSCOPY;  Service: Gastroenterology;  Laterality: N/A;   ESOPHAGOGASTRODUODENOSCOPY (EGD) WITH PROPOFOL N/A 06/29/2018   Procedure: ESOPHAGOGASTRODUODENOSCOPY (EGD) WITH PROPOFOL;  Surgeon: Kathi Der, MD;  Location: [facility] ENDOSCOPY;  Service: Gastroenterology;  Laterality: N/A;   ESOPHAGOGASTRODUODENOSCOPY (EGD) WITH PROPOFOL  N/A 09/30/2018   Procedure: ESOPHAGOGASTRODUODENOSCOPY (EGD) WITH PROPOFOL;  Surgeon: Kathi Der, MD;  Location: [facility];  Service: Gastroenterology;  Laterality: N/A;   ESOPHAGOGASTRODUODENOSCOPY (EGD) WITH PROPOFOL N/A 04/20/2019   Procedure: ESOPHAGOGASTRODUODENOSCOPY (EGD) WITH PROPOFOL;  Surgeon: Kathi Der, MD;  Location: [facility]OS[facility];  Service: Gastroenterology;  Laterality: N/A;   ESOPHAGOGASTRODUODENOSCOPY (EGD) WITH PROPOFOL N/A 04/17/2020   Procedure: ESOPHAGOGASTRODUODENOSCOPY (EGD) WITH PROPOFOL;  Surgeon: Graylin Shiver, MD;  Location: [facility] ENDOSCOPY;  Service: Gastroenterology;  Laterality: N/A;   I & D EXTREMITY Left 02/18/2018   Procedure: IRRIGATION AND DEBRIDEMENT LEFT ARM;  Surgeon: Nada Libman, MD;  Location: [facility] OR;  Service: Vascular;  Laterality: Left;   INSERTION OF DIALYSIS CATHETER N/A 01/19/2018   Procedure: INSERTION OF DIALYSIS CATHETER - RIGHT INTERNAL JUGULAR PLACEMENT;  Surgeon: Larina Earthly, MD;  Location: [facility] OR;  Service: Vascular;  Laterality: N/A;   INSERTION OF DIALYSIS CATHETER Right 11/02/2020   Procedure: INSERTION OF TUNNELED DIALYSIS CATHETER;  Surgeon: Maeola Harman, MD;  Location[facility] OR;  Service: Vascular;  Laterality: Right;   IR FLUORO GUIDE CV LINE RIGHT  11/10/2017   IR PARACENTESIS  12/10/2017   IR PARACENTESIS  12/26/2017   IR REMOVAL TUN CV CATH W/O FL  11/28/2017   IR THROMBECTOMY AV FISTULA W/THROMBOLYSIS/PTA INC/SHUNT/IMG LEFT Left 06/02/2019   IR US GUIDE VASC ACCESS LEFT  06/02/2019   IR US GUIDE VASC ACCESS RIGHT  11/10/2017   NECK SURGERY  2007   "back of neck; no hardware in there"   REVISON OF ARTERIOVENOUS FISTULA Left 11/02/2020   Procedure: REVISON OF ARTERIOVENOUS FISTULA;  Surgeon: Maeola Harman, MD;  Location: [facility] OR;  Service: Vascular;  Laterality: Left;    Allergies  Allergen Reactions   Heparin Other (See Comments)    Patient is Muslim and is not permitted Pork derative due to religious belief)   Pork-Derived Products Other (See Comments)    Patient does not eat pork due to religious beliefs     Physical Exam: General: The patient is alert and oriented x3 in no acute distress.  Dermatology: Skin is warm, dry and supple bilateral lower extremities.   Vascular: Palpable pedal pulses bilaterally.  Skin  is warm to touch.  There is some moderate edema noted diffusely throughout the bilateral lower extremities. VAS [place] ABI WITH/WO TBI 10/06/2020 ABI Findings:  +---------+------------------+-----+---------+--------+  Right   Rt Pressure (mmHg)IndexWaveform Comment   +---------+------------------+-----+---------+--------+  Brachial 178                    triphasic          +---------+------------------+-----+---------+--------+  PTA     225               1.26 triphasic          +---------+------------------+-----+---------+--------+  DP      232               1.30 triphasic          +---------+------------------+-----+---------+--------+  Great Toe41                0.23                    +---------+------------------+-----+---------+--------+   +---------+------------------+-----+---------+---------+  Left    Lt Pressure (mmHg)IndexWaveform Comment    +---------+------------------+-----+---------+---------+  Brachial  HD access  +---------+------------------+-----+---------+---------+  PTA     224               1.26 triphasic           +---------+------------------+-----+---------+---------+  DP      214               1.20 biphasic            +---------+------------------+-----+---------+---------+  Great Toe122               0.69                     +---------+------------------+-----+---------+---------+   +-------+-----------+-----------+------------+------------+  ABI/TBIToday's ABIToday's TBIPrevious ABIPrevious TBI  +-------+-----------+-----------+------------+------------+  Right 1.3        0.23                                 +-------+-----------+-----------+------------+------------+  Left  1.26       0.69                                 +-------+-----------+-----------+------------+------------+  Summary:  Right: Resting right ankle-brachial index is within normal  range. No  evidence of significant right lower extremity arterial disease. The right  toe-brachial index is abnormal. TBI likely unreliable due to skin changes on the great toe.  Left: Resting left ankle-brachial index is within normal range. No  evidence of significant left lower extremity arterial disease. The left  toe-brachial index is abnormal.   Neurological: Light touch and protective threshold diminished  Musculoskeletal Exam: History of left third toe amputation.  Healed.  Radiographic Exam B/L feet 08/27/2023:  Normal osseous mineralization.  Mild degenerative changes noted throughout the pedal joints of the foot but overall they appear well.  No fractures or osseous irregularities noted.  Impression: Negative  Assessment/Plan of Care: 1.  Idiopathic bilateral lower extremity edema x 1 month 2.  Diabetes mellitus with peripheral polyneuropathy 3.  History of left great toe amputation  -Patient evaluated.  Comprehensive diabetic foot exam performed today -Recommend compression socks daily -Appointment with diabetic shoe department for diabetic shoes with custom molded Plastizote insoles.  Order placed -Return to clinic annually       Felecia Shelling, DPM [facility]  Dr. Felecia Shelling, DPM    [address] [place], [place] [address]                Office [phone]  Fax [phone]

## 2023-09-09 DIAGNOSIS — Z4822 Encounter for aftercare following kidney transplant: Secondary | ICD-10-CM | POA: Diagnosis not present

## 2023-09-09 DIAGNOSIS — E1122 Type 2 diabetes mellitus with diabetic chronic kidney disease: Secondary | ICD-10-CM | POA: Diagnosis not present

## 2023-09-09 DIAGNOSIS — I132 Hypertensive heart and chronic kidney disease with heart failure and with stage 5 chronic kidney disease, or end stage renal disease: Secondary | ICD-10-CM | POA: Diagnosis not present

## 2023-09-09 DIAGNOSIS — Z79624 Long term (current) use of inhibitors of nucleotide synthesis: Secondary | ICD-10-CM | POA: Diagnosis not present

## 2023-09-09 DIAGNOSIS — R1900 Intra-abdominal and pelvic swelling, mass and lump, unspecified site: Secondary | ICD-10-CM | POA: Diagnosis not present

## 2023-09-09 DIAGNOSIS — Z1211 Encounter for screening for malignant neoplasm of colon: Secondary | ICD-10-CM | POA: Diagnosis not present

## 2023-09-09 DIAGNOSIS — I509 Heart failure, unspecified: Secondary | ICD-10-CM | POA: Diagnosis not present

## 2023-09-09 DIAGNOSIS — Z79899 Other long term (current) drug therapy: Secondary | ICD-10-CM | POA: Diagnosis not present

## 2023-09-09 DIAGNOSIS — Z7952 Long term (current) use of systemic steroids: Secondary | ICD-10-CM | POA: Diagnosis not present

## 2023-09-09 DIAGNOSIS — Z23 Encounter for immunization: Secondary | ICD-10-CM | POA: Diagnosis not present

## 2023-09-09 DIAGNOSIS — Z940 Kidney transplant status: Secondary | ICD-10-CM | POA: Diagnosis not present

## 2023-09-09 DIAGNOSIS — N186 End stage renal disease: Secondary | ICD-10-CM | POA: Diagnosis not present

## 2023-09-09 DIAGNOSIS — D84821 Immunodeficiency due to drugs: Secondary | ICD-10-CM | POA: Diagnosis not present

## 2023-09-09 DIAGNOSIS — Z944 Liver transplant status: Secondary | ICD-10-CM | POA: Diagnosis not present

## 2023-09-09 DIAGNOSIS — Z79621 Long term (current) use of calcineurin inhibitor: Secondary | ICD-10-CM | POA: Diagnosis not present

## 2023-09-09 DIAGNOSIS — Z992 Dependence on renal dialysis: Secondary | ICD-10-CM | POA: Diagnosis not present

## 2023-09-09 DIAGNOSIS — K3189 Other diseases of stomach and duodenum: Secondary | ICD-10-CM | POA: Diagnosis not present

## 2023-09-10 ENCOUNTER — Encounter (HOSPITAL_COMMUNITY): Payer: Self-pay

## 2023-09-22 ENCOUNTER — Other Ambulatory Visit: Payer: 59

## 2023-09-22 ENCOUNTER — Telehealth: Payer: Self-pay

## 2023-09-22 NOTE — Telephone Encounter (Signed)
Called pt to reschedule

## 2023-09-23 ENCOUNTER — Telehealth: Payer: Self-pay

## 2023-09-23 NOTE — Telephone Encounter (Signed)
 Called pt voicemail was full.

## 2023-09-29 ENCOUNTER — Ambulatory Visit: Payer: Medicare (Managed Care) | Admitting: Emergency Medicine

## 2023-10-07 ENCOUNTER — Encounter (HOSPITAL_COMMUNITY): Payer: Self-pay

## 2023-10-14 NOTE — Progress Notes (Signed)
 This encounter was created in error - please disregard.  Called specific interpreter to call pt or visit.  No answer from pt, specific interpreter LDM for pt to call office to reschedule visit.

## 2023-11-05 ENCOUNTER — Other Ambulatory Visit: Payer: Self-pay | Admitting: Emergency Medicine

## 2023-11-05 DIAGNOSIS — E1121 Type 2 diabetes mellitus with diabetic nephropathy: Secondary | ICD-10-CM

## 2023-11-05 NOTE — Telephone Encounter (Unsigned)
 Copied from CRM [id number]. Topic: Clinical - Medication Refill >> Nov 05, 2023 12:40 PM Sim Boast F wrote: Most Recent Primary Care Visit:  Provider: Wyvonne Lenz  Department: [facility]  Visit Type: MEDICARE AWV, INITIAL  Date: 10/14/2023  Medication: insulin degludec (TRESIBA FLEXTOUCH) 100 UNIT/ML FlexTouch Pen [[id number]]  Has the patient contacted their pharmacy? Yes (Agent: If no, request that the patient contact the pharmacy for the refill. If patient does not wish to contact the pharmacy document the reason why and proceed with request.) (Agent: If yes, when and what did the pharmacy advise?)  Is this the correct pharmacy for this prescription? Yes If no, delete pharmacy and type the correct one.  This is the patient's preferred pharmacy:   [facility] [address] [place] - [address] [place] [address] Phone: [phone] Fax: [phone]   Has the prescription been filled recently? Yes  Is the patient out of the medication? Yes  Has the patient been seen for an appointment in the last year OR does the patient have an upcoming appointment? Yes  Can we respond through MyChart? No  Agent: Please be advised that Rx refills may take up to 3 business days. We ask that you follow-up with your pharmacy.

## 2023-11-06 MED ORDER — TRESIBA FLEXTOUCH 100 UNIT/ML ~~LOC~~ SOPN: 14.0000 [IU] | PEN_INJECTOR | Freq: Every day | SUBCUTANEOUS | 0 refills | Status: DC

## 2023-11-25 ENCOUNTER — Other Ambulatory Visit: Payer: Self-pay | Admitting: Emergency Medicine

## 2023-11-25 DIAGNOSIS — E1121 Type 2 diabetes mellitus with diabetic nephropathy: Secondary | ICD-10-CM

## 2023-12-01 ENCOUNTER — Ambulatory Visit: Payer: Medicare (Managed Care) | Admitting: Emergency Medicine

## 2023-12-02 ENCOUNTER — Ambulatory Visit (INDEPENDENT_AMBULATORY_CARE_PROVIDER_SITE_OTHER): Admitting: Emergency Medicine

## 2023-12-02 ENCOUNTER — Encounter: Payer: Self-pay | Admitting: Emergency Medicine

## 2023-12-02 VITALS — BP 142/80 | HR 85 | Temp 97.9°F | Ht 65.0 in | Wt 153.0 lb

## 2023-12-02 DIAGNOSIS — I5022 Chronic systolic (congestive) heart failure: Secondary | ICD-10-CM

## 2023-12-02 DIAGNOSIS — K7460 Unspecified cirrhosis of liver: Secondary | ICD-10-CM | POA: Diagnosis not present

## 2023-12-02 DIAGNOSIS — Z794 Long term (current) use of insulin: Secondary | ICD-10-CM

## 2023-12-02 DIAGNOSIS — Z940 Kidney transplant status: Secondary | ICD-10-CM

## 2023-12-02 DIAGNOSIS — Z9189 Other specified personal risk factors, not elsewhere classified: Secondary | ICD-10-CM

## 2023-12-02 DIAGNOSIS — Z944 Liver transplant status: Secondary | ICD-10-CM | POA: Diagnosis not present

## 2023-12-02 DIAGNOSIS — H60501 Unspecified acute noninfective otitis externa, right ear: Secondary | ICD-10-CM

## 2023-12-02 DIAGNOSIS — E1121 Type 2 diabetes mellitus with diabetic nephropathy: Secondary | ICD-10-CM

## 2023-12-02 DIAGNOSIS — R188 Other ascites: Secondary | ICD-10-CM

## 2023-12-02 LAB — POCT GLYCOSYLATED HEMOGLOBIN (HGB A1C): Hemoglobin A1C: 9.2 % — AB (ref 4.0–5.6)

## 2023-12-02 MED ORDER — CIPROFLOXACIN-DEXAMETHASONE 0.3-0.1 % OT SUSP: 4.0000 [drp] | Freq: Two times a day (BID) | OTIC | 0 refills | Status: AC

## 2023-12-02 NOTE — Assessment & Plan Note (Signed)
Currently euvolemic No clinical findings of congestive heart failure Stable and well-controlled

## 2023-12-02 NOTE — Assessment & Plan Note (Signed)
Stable.  No signs of hepatic encephalopathy. No signs of peritonitis.

## 2023-12-02 NOTE — Assessment & Plan Note (Addendum)
 Clinically stable. Hemoglobin A1c at 9.2 better than before. Continues Premeal regular insulin  Continues Tresiba  at 10 units. Advised to increase Tresiba  2 units at a time for target fasting morning blood sugar of 100.

## 2023-12-02 NOTE — Patient Instructions (Signed)
??? ?????? ???????? ??? ???????? Diabetes Mellitus and Nutrition, Adult ????? ????? ?? ?????? ?? ??? ??????? ???? ?? ??????? ???? ?? ???? ????? ????? ???? ???? ??? ??????? ??? ???? (????????) ????? ???? ???? ??? ????? ??????. ?? ????? ??????? ?????? ?????? ?????? ?? ???????? ????? ??????? ?? ???? ?????? ???:  ??????? ??? ???? ???????? ?? ????.  ????? ????? ??????? ?????? ?????.  ????? ??? ????.  ?????? ??? ????? ????? ??????? ????. ?? ???? ???? ?? ???? ??? ??? ??????? ?????? ??? ????? ?? ??? ????? ?????? ?? ????? ???? ??? ???????? ?????? ??????? ???? ???????. ??? ?? ???? ????? ??????? ?????? ?? ?????? ?? ?????? ???? ?????? ???? ??? ????? ?????? ????? ?? ????. ??? ????? ??? ?????? ????? ?????? ???:  ??? ??????? ???????? ???? ??????.  ??????? ???? ????????.  ????.  ???? ?????? ???? ???? ???? ????????????.  ????? ?????? ???? ??????.  ??????? ?????? ?????? ???? ????? ????? ??? ??? ????? ?? ??? ??????. ??? ???? ???????????? ????? ???? ???????????? ??? ????? ???????? ????? ???? ?? ?? ??? ??? ?? ??????? ????????. ?????? ?? ????? ???????????? ???? ???? ???????? ?? ????. ??? ?? ????? ?? ???? ???? ???????????? ???? ???? ?? ???????? ????? ?? ?? ????. ????? ??? ??????? ??????? ??? ???. ????? ?? ?????? ??????? ??????? ?? ???? ???? ???????????? ???? ????? ???? ??????? ?? ?? ???? ????? ?? ???? ?????. ??? ???? ??????? ????????? ????? ????????? ???????? ???? ?? ???? ???? ?????? ???? (??? ??? ????)? ????? ??? ??? ?????? ????????? ?? ????? ????? ?????? ???????? ?? ???? ????. ???? ??? ???? ???? ????? ?? ???? ??????. ?? ????? ??? ??? ???? ???????? ??????? ??????? ?????? ???? ????? ??????? ?? ????? ?????????.  ?? ?????? ????????? ??????? ?? ??????? ???????: ? ??? ????? ???? ??????? ?????? ??????? ?????? ???? ????? ?????????.  ? ??? ???? ??????? ?? ?????? ?????? ?? ??????? ????? ?? ?????? ??????.  ??? ??? ?????? ????????? ????????: ? ??? ???? ??????? ???? ???????? ??? ????? ??????: ? ????? ???? ??? ???? ??????.   ? ??????? ??? ???? ??????. ? ???? ???? ?????? ?? ?? ????? ???????. ?? ???????? ???????? ????? ??????? ?????? ???? ??? 12 ????? (355 ??) ?? ?????? ?? ??? ??? 5 ?????? (148 ??) ?? ?????? ?? ??? ??? ????? ???? (44 ??) ?? ????????? ???????? ??????. ? ???? ?????? ?? ???? ?????? ??? ????? ?? ?????? ???? ?? ????? ??? ????? ?????? ?? ???????? ??? ???????. ?? ?????? ?? ?????? ???????? ????????? ?????????? ???????? ?????? ?? ????? ??? ???? ????? ?? ????? ???? ?? ????? ??? ????????????. ?? ??????? ??????? ???? ?????? ?? ????? ??? ??????  ????? ?????? ????????? ????????  ???? ??????? ?? ????? ??????? ??? ???? ????????? ???????? ("Nutrition Facts") ??? ??????? ?????????? ???????. ?????? ?? ??? ??????? ???????? ?????? ???????????? ??????? ???????? ???????? ?????? ?????? ??? ?????? ???? ??? ?? ???? ???? ?? ????? ??????? ??????. ?? ???? ?????? ?? ???????? ????? ?????? ??????? ??? ???? ?? ????? ?????.  ???? ?? ?????? ???????????? (?????????) ?? ?? ??? ????.  ???? ?? ??? ?????? ?????? ??????? ??????? ???????? ?? ????? ???????. ????? ??????? ???? ????? ??? ???? ????? ?? ??? ?????? ?? ??????? ????.  ???? ?? ??? ????????? (????) ??? ?????? (????????) ?? ????? ??????? ??????. ????? ??? ???? ??????? ????? ???? ???????? ????? ??? ?? 2,300 ??????? ?? ?????.  ???? ?????? ??? ?????? ??????? ?????? ???????? ??? ??????? ?????? ????? "?????? ??????" "low-fat" ?? "?? ????? ??? ????" ?? "????? ?? ??????" "nonfat". ???? ??????? ?? ????? ??? ????? ???? ?? ????? ?????? ?? ???????????? ??????? ???? ??????.  ???? ??? ??????? ????? ???????? ?????? ??????? ??????? ?? ?????? ???????? ??????? ??? ??????. ??????  ???? ????? ??????? ??????? ?? ?????? ?????? ?? ??????? ????????. ???? ??????? ????? ?????? ??? ????? ????? ?? ????????? ?????? ?????? ???????.  ????? ?? ?????? ??????? ?? ???? ??????? ????????. ???? ?? ?????? ???? ???? ??? ?????? ??????? ?????????? ???????? ??????? ???????? ??????? ???????? ??????? ??????? ???????. ?????  ??????  ?????? ????? ??? ????? ??????? ??? ?????? ????? ?? ????? ??? ????? ????? ??????? ??? ????? ??????.  ?????? ?????? ?????? ?? ????? ??? ???? ??????? ?? ???????? ?? ????? ?????.  ???? ????? ??????? ?? ??????? ?? ?????? ????? ??????. ????? ???????  ????? ??????? ???????? ???????? ??????? ???????? ????? ?? ????? ???? ?? ???. ???? ?????? ???? ????? ??? ????? ???? ???? ????? ?????.  ????? ??????? ?????? ???????? ??? ??????? ??????? ????????? ?????????? ??????? ???????.  ?????  4-6 ?????? (112-168 ????) ?? ?????????? ????? ?????? ?? ??? ??? ??????? ????? ?????? ?? ?????? ??????? ????????? ?? ?????? ?? ??????. ???????? (???????) (?????? 28 ??) ??????? ?? ?????????? ????? ?????? ??????: ? ????? ????? (28 ????) ?? ?????? ?? ??????? ?? ???????. ? ???? ?????. ?  ??? (62 ??????) ?? ??????.  ????? ?????? ??? ??????? ???? ????? ??? ???? ????? ??? ????????? ????????? ??????? ????????. ?? ??????? ???? ??? ?? ????????? ??????? ?????. ??????. ????????. ?????. ??????. ???????. ?????. ???????. ????????. ??????. ??????. ?????. ????????? ????????? ???????? ??? ?? ??? ???? ???????? ??????? ?? ???????? ?????? ?? ???? ?????? ??????? ?????? ???????? ???????. ???????. ????????. ????????. ?????. ????? ??????. ???????. ??????. ?????. ??????. ??????? ???????? (???? ??????). ?????? ?????? ??????? ??? ????? ?????? ?? ??????? ?????? ??????? ?????????? ?????????? ??????? ?????????. ??????? ??? ???????. ???????. ????? ????? ?? ?????. ?????? ??????????? ?????? ????????? ???????. ??????? ???? ?????. ??? ??????? ?????? ??????. ??????. ????????. ??????. ?????? ??????? ?????? ??????? ?????? ????? ?? ?????? ????? ??? ?????? ???????? ??????. ??????? ???????? ????? ?? ?? ???? ??????? ??????? ?? ????????? ?????????? ???? ????? ???????. ??? ????? ??????? ??????? ?????? ?????? ?? ?????????. ?? ??????? ?????? ??????? ??????? ??????? ??????? ??? ???????. ????????? ????????? ???????. ???????? ??????? ??????? ???? ?????? ?? ????  ???????. ?????? ?????? ?????? ?????? ?????? ??????? ?????? ??? ?????? ?????????? ???????? ??????? ???????. ???? ???? ??????? ????????. ?????? ??????????? ?????? ??? ????? ?????? ??????. ??????? ?? ??????. ????? ?????? ?????? ?? ??????. ?????? ???????. ???? ?????? ???????. ?????? ??????? ????? ?? ??? ?? ???? ???? ?????. ????????? ????????? ???????? ??? ????? ?????? ?? ??????. ???? ?? ???? ??????? ??????? ????? ??????? ??????? ?? ????????? ?????????? ???? ????? ???? ??????. ??? ????? ??????? ??????? ?????? ?????? ?? ?????????. ????? ????? ????? ??? ??????? ??????? ?????? ??????? ??  ?? ????? ??? ??? ???? ?? ??????? ????? ?????? ???? ?????? ?????????  ?? ????? ??? ??????? ??????? ??????  ?? ??? ?????? ???? ?????? ??????? ?? ???? ?? ??? ??????  ?? ???? ??? ???? ?????? ????? ????? ?????? ??? ?????? ?? ?????????:  ??????? ????????? ?????? (American Diabetes Association):? diabetes.org  ???????? ??????? ???????? ???????? (Academy of Nutrition and Dietetics):? [email or web address]  ?????? ?????? ?????? ?????? ?????? ?????? ?????? [employer] of Diabetes and Digestive and Kidney Diseases):? [email or web address]  ????? ?????? ????? ???? ?????? ???????? (Association of Diabetes Care & Education Specialists):? [email or web address] ????  ?? ??????? ???? ?? ???? ????? ????? ???? ???? ??? ??????? ??? ???? (????????) ????? ???? ???? ??? ????? ??????. ??? ??????? ??? ?????? ????????? ???????? ??? ????.  ??? ??? ??????? ?????? ??????? ??? ??????? ??? ???? ???????? ?? ???? ????? ?? ???????? ??????? ?????? ?????.  ??? ?? ???? ????? ??????? ?????? ?? ?????? ?? ?????? ???? ?????? ???? ??? ????? ?????? ????? ?? ????. ??? ????? ?? ??? ????????? ?? ???? ?????? ????????? ???? ?????? ???? ??????? ??????. ???? ?? ?????? ??? ????? ???? ?? ???? ?? ???? ??????? ??????.? Document Revised: 03/29/2020 Document Reviewed: 03/29/2020 Elsevier Patient Education  2024 [employer].

## 2023-12-02 NOTE — Progress Notes (Signed)
 William [name] 73 y.o.   Chief Complaint  Patient presents with   Medical Management of Chronic Issues    Requesting referrals to a new Endocrinologist, and a dentist   Ear Pain    Right ear, had an infection in this ear wants it looked at again. Ongoing for about 3 months    HISTORY OF PRESENT ILLNESS: This is a 73 y.o. male here for follow-up of chronic medical conditions Diabetic.  Needs referral to endocrinologist Active right ear infection Wt Readings from Last 3 Encounters:  12/02/23 153 lb (69.4 kg)  08/27/23 154 lb 6.1 oz (70 kg)  06/02/23 154 lb 6 oz (70 kg)   Lab Results  Component Value Date   HGBA1C 9.2 (A) 06/02/2023   BP Readings from Last 3 Encounters:  12/02/23 (!) 142/80  06/02/23 128/88  03/26/23 128/74     HPI   Prior to Admission medications   Medication Sig Start Date End Date Taking? Authorizing Provider  acetaminophen  (TYLENOL ) 650 MG CR tablet Take 650 mg by mouth every 8 (eight) hours as needed for pain.    [provider]  calcium  acetate (PHOSLO ) 667 MG tablet Take 667 mg by mouth 3 (three) times daily. Patient not taking: Reported on 10/08/2022 05/09/21   [provider]  Calcium  Citrate-Vitamin D 315-5 MG-MCG TABS Take by mouth. 10/03/21   [provider]  GENGRAF  100 MG capsule Take 100 mg by mouth 2 (two) times daily. 02/26/22   [provider]  GENGRAF  25 MG capsule Take by mouth. 02/26/22   [provider]  Insulin  Pen Needle (ADVOCATE INSULIN  PEN NEEDLES) 31G X 5 MM MISC Use four times daily to inject insulin  04/27/18   Lajean Pike, MD  insulin  regular (NOVOLIN R,HUMULIN R ) 100 units/mL injection Inject 6-8 Units into the skin See admin instructions. Inject 8 units subcutaneously with breakfast, and 6 units with lunch and supper Sliding scale    [provider]  lactulose  (CHRONULAC ) 10 GM/15ML solution Take 45 mLs (30 g total) by mouth 3 (three) times daily. Patient not taking: Reported on  03/26/2023 06/14/21   Lotta Frankenfield Jose, MD  magnesium oxide (MAG-OX) 400 MG tablet Take by mouth. 02/26/22   [provider]  Multiple Vitamin (MULTIVITAMIN WITH MINERALS) TABS tablet Take 1 tablet by mouth daily.    [provider]  mupirocin  ointment (BACTROBAN ) 2 % Apply 1 application topically daily. 05/30/21   Elvira Hammersmith, MD  mycophenolate  (CELLCEPT ) 250 MG capsule Take 500 mg by mouth 2 (two) times daily. 02/26/22   [provider]  ondansetron  (ZOFRAN  ODT) 4 MG disintegrating tablet Take 1 tablet (4 mg total) by mouth 2 (two) times daily as needed for nausea or vomiting. 05/01/20   [name], Hallie C, PA-C  ONE TOUCH LANCETS MISC Use to test blood sugar three times daily 04/27/18   Lajean Pike, MD  [facility] VERIO test strip USE 1 STRIP TO CHECK GLUCOSE THREE TIMES DAILY 07/23/21   Lajean Pike, MD  predniSONE  (DELTASONE ) 5 MG tablet Take by mouth. 02/15/22   [provider]  TRESIBA  FLEXTOUCH 100 UNIT/ML FlexTouch Pen INJECT 14 UNITS SUBCUTANEOUSLY ONCE DAILY 11/25/23   Jen Eppinger Jose, MD    Allergies  Allergen Reactions   Heparin  Other (See Comments)    Patient is Muslim and is not permitted Pork derative due to religious belief)   Pork-Derived Products Other (See Comments)    Patient does not eat pork due to religious beliefs  Patient Active Problem List   Diagnosis Date Noted   History of kidney transplant 03/26/2023   Edentulous 03/26/2023   Diabetic foot (HCC) 03/26/2023   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   Elevated PSA 12/14/2020   Repeated falls 11/13/2020   Coagulation defect, unspecified (HCC) 11/06/2020   Other microscopic hematuria 11/03/2020   Traumatic amputation of third toe of left foot (HCC) 10/15/2020   Diabetic foot infection (HCC) 10/06/2020   Thrombotic microangiopathy, 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   Physical deconditioning 04/27/2020   History of liver transplant (HCC) 04/27/2020   Presbycusis of both ears  09/24/2019   Chronic systolic CHF (congestive heart failure) (HCC)    Esophageal varices (HCC) 09/23/2018   Thrombocytopenia (HCC) 09/23/2018   CHF (congestive heart failure) (HCC) 09/22/2018   Mild protein-calorie malnutrition (HCC) 02/04/2018   Anemia in chronic kidney disease 01/22/2018   Iron  deficiency anemia, unspecified 01/22/2018   Secondary hyperparathyroidism of renal origin (HCC) 01/22/2018   CKD (chronic kidney disease) stage 4, GFR 15-29 ml/min (HCC) 12/19/2017   Cirrhosis of liver with ascites (HCC) 12/09/2017   Goodpasture's syndrome (HCC) with associated hemoptysis 11/20/2017   Edema of both feet 11/20/2017   Pancytopenia (HCC) 11/06/2017   Hypertension 11/06/2017   Type II diabetes mellitus with renal manifestations (HCC) 08/25/2015    Past Medical History:  Diagnosis Date   Anemia    Blind one eye    CHF (congestive heart failure) (HCC)    Cirrhosis (HCC)    ESRD on hemodialysis (HCC)    T, Th, Sat. [place]   Goodpasture's disease [facility])    on outpatient plasmapheresis/notes 11/20/2017   Grade I diastolic dysfunction 10/14/2017   History of anemia due to chronic kidney disease    History of blood transfusion X 1   "UGIB; low blood count"   History of plasmapheresis    "qod" (11/20/2017)   Hypertension    Pancytopenia (HCC) 08/20/2017   Type II diabetes mellitus [facility])     Past Surgical History:  Procedure Laterality Date   A/V FISTULAGRAM Left 10/16/2020   Procedure: A/V FISTULAGRAM;  Surgeon: Adine Hoof, MD;  Location: [facility] INVASIVE CV LAB;  Service: Cardiovascular;  Laterality: Left;   AMPUTATION TOE Left 10/07/2020   Procedure: AMPUTATION THIRD TOE;  Surgeon: Floyce Hutching, DPM;  Location: [facility] OR;  Service: Podiatry;  Laterality: Left;   AV FISTULA PLACEMENT Left 12/26/2017   Procedure: LEFT BRACHIOCEPHALIC ARTERIOVENOUS (AV) FISTULA CREATION;  Surgeon: Arvil Lauber, MD;  Location: [facility] OR;  Service: Vascular;  Laterality: Left;   AV  FISTULA PLACEMENT Left 11/02/2020   Procedure: INSERTION OF ARTERIOVENOUS (AV) GORE-TEX GRAFT ARM;  Surgeon: Adine Hoof, MD;  Location: [facility] OR;  Service: Vascular;  Laterality: Left;   AV FISTULA PLACEMENT Right 06/01/2021   Procedure: RIGHT ARM BRACHIOBASILIC ARTERIOVENOUS (AV) FISTULA CREATION;  Surgeon: Adine Hoof, MD;  Location: [facility] OR;  Service: Vascular;  Laterality: Right;   BASCILIC VEIN TRANSPOSITION Left 02/16/2018   Procedure: BRACHIOBASILIC VEIN TRANSPOSITION SECOND STAGE;  Surgeon: Arvil Lauber, MD;  Location: [facility] OR;  Service: Vascular;  Laterality: Left;   BASCILIC VEIN TRANSPOSITION Right 07/27/2021   Procedure: SECOND STAGE RIGHT ARM BASILIC VEIN TRANSPOSITION;  Surgeon: Carlene Che, MD;  Location: [facility] OR;  Service: Vascular;  Laterality: Right;   CATARACT EXTRACTION W/ INTRAOCULAR LENS  IMPLANT, BILATERAL Bilateral    ESOPHAGEAL BANDING N/A 04/01/2018   Procedure: ESOPHAGEAL BANDING;  Surgeon: Felecia Hopper, MD;  Location: [facility] ENDOSCOPY;  Service:  Gastroenterology;  Laterality: N/A;   ESOPHAGEAL BANDING N/A 06/29/2018   Procedure: ESOPHAGEAL BANDING;  Surgeon: Felecia Hopper, MD;  Location: [facility] ENDOSCOPY;  Service: Gastroenterology;  Laterality: N/A;   ESOPHAGOGASTRODUODENOSCOPY (EGD) WITH PROPOFOL  N/A 04/01/2018   Procedure: ESOPHAGOGASTRODUODENOSCOPY (EGD) WITH PROPOFOL ;  Surgeon: Felecia Hopper, MD;  Location: [facility] ENDOSCOPY;  Service: Gastroenterology;  Laterality: N/A;   ESOPHAGOGASTRODUODENOSCOPY (EGD) WITH PROPOFOL  N/A 06/29/2018   Procedure: ESOPHAGOGASTRODUODENOSCOPY (EGD) WITH PROPOFOL ;  Surgeon: Felecia Hopper, MD;  Location: [facility];  Service: Gastroenterology;  Laterality: N/A;   ESOPHAGOGASTRODUODENOSCOPY (EGD) WITH PROPOFOL  N/A 09/30/2018   Procedure: ESOPHAGOGASTRODUODENOSCOPY (EGD) WITH PROPOFOL ;  Surgeon: Felecia Hopper, MD;  Location: [facility];  Service: Gastroenterology;  Laterality: N/A;    ESOPHAGOGASTRODUODENOSCOPY (EGD) WITH PROPOFOL  N/A 04/20/2019   Procedure: ESOPHAGOGASTRODUODENOSCOPY (EGD) WITH PROPOFOL ;  Surgeon: Felecia Hopper, MD;  Location: [facility];  Service: Gastroenterology;  Laterality: N/A;   ESOPHAGOGASTRODUODENOSCOPY (EGD) WITH PROPOFOL  N/A 04/17/2020   Procedure: ESOPHAGOGASTRODUODENOSCOPY (EGD) WITH PROPOFOL ;  Surgeon: Celedonio Coil, MD;  Location: [facility] ENDOSCOPY;  Service: Gastroenterology;  Laterality: N/A;   I & D EXTREMITY Left 02/18/2018   Procedure: IRRIGATION AND DEBRIDEMENT LEFT ARM;  Surgeon: Margherita Shell, MD;  Location: [facility] OR;  Service: Vascular;  Laterality: Left;   INSERTION OF DIALYSIS CATHETER N/A 01/19/2018   Procedure: INSERTION OF DIALYSIS CATHETER - RIGHT INTERNAL JUGULAR PLACEMENT;  Surgeon: Mayo Speck, MD;  Location: [facility] OR;  Service: Vascular;  Laterality: N/A;   INSERTION OF DIALYSIS CATHETER Right 11/02/2020   Procedure: INSERTION OF TUNNELED DIALYSIS CATHETER;  Surgeon: Adine Hoof, MD;  Location: [facility] OR;  Service: Vascular;  Laterality: Right;   IR FLUORO GUIDE CV LINE RIGHT  11/10/2017   IR PARACENTESIS  12/10/2017   IR PARACENTESIS  12/26/2017   IR REMOVAL TUN CV CATH W/O FL  11/28/2017   IR THROMBECTOMY AV FISTULA W/THROMBOLYSIS/PTA INC/SHUNT/IMG LEFT Left 06/02/2019   IR US  GUIDE VASC ACCESS LEFT  06/02/2019   IR US  GUIDE VASC ACCESS RIGHT  11/10/2017   NECK SURGERY  2007   "back of neck; no hardware in there"   REVISON OF ARTERIOVENOUS FISTULA Left 11/02/2020   Procedure: REVISON OF ARTERIOVENOUS FISTULA;  Surgeon: Adine Hoof, MD;  Location: [facility] OR;  Service: Vascular;  Laterality: Left;    Social History   Socioeconomic History   Marital status: Married    Spouse name: Not on file   Number of children: Not on file   Years of education: Not on file   Highest education level: Not on file  Occupational History   Not on file  Tobacco Use   Smoking status: Never   Smokeless tobacco: Never  Vaping  Use   Vaping status: Never Used  Substance and Sexual Activity   Alcohol  use: Never   Drug use: Never   Sexual activity: Not Currently  Other Topics Concern   Not on file  Social History Narrative   Not on file   Social Drivers of Health   Financial Resource Strain: Not on file  Food Insecurity: No Food Insecurity (04/25/2022)   Hunger Vital Sign    Worried About Running Out of Food in the Last Year: Never true    Ran Out of Food in the Last Year: Never true  Transportation Needs: No Transportation Needs (04/25/2022)   PRAPARE - Transportation    Lack of Transportation (Medical): No    Lack of Transportation (Non-Medical): No  Physical Activity: Inactive (09/18/2021)   [facility]  [employer] [facility]   Exercise Vital Sign    Days of Exercise per Week: 0 days    Minutes of Exercise per Session: 0 min  Stress: Not on file  Social Connections: Not on file  Intimate Partner Violence: Not on file    Family History  Problem Relation Age of Onset   Diabetes Mellitus II Sister    Diabetes Sister    Stroke Brother    Heart attack Brother      Review of Systems  Constitutional: Negative.  Negative for chills and fever.  HENT:  Positive for ear pain. Negative for congestion and sore throat.   Respiratory: Negative.  Negative for cough and shortness of breath.   Cardiovascular: Negative.  Negative for chest pain and palpitations.  Gastrointestinal:  Negative for abdominal pain, diarrhea, nausea and vomiting.  Genitourinary: Negative.  Negative for dysuria and hematuria.  Skin: Negative.  Negative for rash.  Neurological: Negative.  Negative for dizziness and headaches.  All other systems reviewed and are negative.   Today's Vitals   12/02/23 1328  BP: (!) 142/80  Pulse: 85  Temp: 97.9 F (36.6 C)  TempSrc: Temporal  SpO2: 96%  Weight: 153 lb (69.4 kg)  Height: 5\' 5"  (1.651 m)   Body mass index is 25.46 kg/m.   Physical  Exam Vitals reviewed.  Constitutional:      Appearance: Normal appearance.  HENT:     Head: Normocephalic.     Left Ear: Tympanic membrane, ear canal and external ear normal.     Ears:     Comments: Positive discharge and erythema right external canal    Mouth/Throat:     Mouth: Mucous membranes are moist.     Pharynx: Oropharynx is clear.  Eyes:     Extraocular Movements: Extraocular movements intact.     Pupils: Pupils are equal, round, and reactive to light.  Cardiovascular:     Rate and Rhythm: Normal rate and regular rhythm.     Pulses: Normal pulses.     Heart sounds: Normal heart sounds.  Pulmonary:     Effort: Pulmonary effort is normal.     Breath sounds: Normal breath sounds.  Abdominal:     Palpations: Abdomen is soft.     Tenderness: There is no abdominal tenderness.  Musculoskeletal:     Cervical back: No tenderness.  Lymphadenopathy:     Cervical: No cervical adenopathy.  Skin:    General: Skin is warm and dry.     Capillary Refill: Capillary refill takes less than 2 seconds.  Neurological:     General: No focal deficit present.     Mental Status: He is alert and oriented to person, place, and time.  Psychiatric:        Mood and Affect: Mood normal.        Behavior: Behavior normal.    Results for orders placed or performed in visit on 12/02/23 (from the past 24 hours)  POCT glycosylated hemoglobin (Hb A1C)     Status: Abnormal   Collection Time: 12/02/23  1:48 PM  Result Value Ref Range   Hemoglobin A1C 9.2 (A) 4.0 - 5.6 %   HbA1c POC (<> result, manual entry)     HbA1c, POC (prediabetic range)     HbA1c, POC (controlled diabetic range)       ASSESSMENT & PLAN: A total of 44 minutes was spent with the patient and counseling/coordination of care regarding preparing for this visit, review  of most recent office visit notes, review of multiple chronic medical conditions and their management, review of all medications, review of most recent bloodwork  results, review of health maintenance items, education on nutrition, prognosis, documentation, and need for follow up.   Problem List Items Addressed This Visit       Cardiovascular and Mediastinum   Chronic systolic CHF (congestive heart failure) (HCC)   Currently euvolemic No clinical findings of congestive heart failure Stable and well-controlled        Digestive   Cirrhosis of liver with ascites (HCC) (Chronic)   Stable.  No signs of hepatic encephalopathy. No signs of peritonitis.        Endocrine   Type II diabetes mellitus with renal manifestations (HCC) - Primary (Chronic)   Clinically stable. Hemoglobin A1c at 9.2 better than before. Continues Premeal regular insulin  Continues Tresiba  at 10 units. Advised to increase Tresiba  2 units at a time for target fasting morning blood sugar of 100.      Relevant Orders   Ambulatory referral to Endocrinology   POCT glycosylated hemoglobin (Hb A1C) (Completed)     Other   History of liver transplant (HCC)   Doing well.  Stable. Continues immunosuppressive treatment Followed at [facility]      History of kidney transplant   Stable.  Continues immunosuppressive treatment Follows up at [facility]      Other Visit Diagnoses       Acute otitis externa of right ear, unspecified type       Relevant Medications   ciprofloxacin -dexamethasone  (CIPRODEX ) OTIC suspension     At risk for dental problems       Relevant Orders   Ambulatory referral to Dentistry      Patient Instructions  ??? ?????? ???????? ??? ???????? Diabetes Mellitus and Nutrition, Adult ????? ????? ?? ?????? ?? ??? ??????? ???? ?? ??????? ???? ?? ???? ????? ????? ???? ???? ??? ??????? ??? ???? (????????) ????? ???? ???? ??? ????? ??????. ?? ????? ??????? ?????? ?????? ?????? ?? ???????? ????? ??????? ?? ???? ?????? ???:  ??????? ??? ???? ???????? ?? ????.  ????? ????? ??????? ?????? ?????.  ????? ???  ????.  ?????? ??? ????? ????? ??????? ????. ?? ???? ???? ?? ???? ??? ??? ??????? ?????? ??? ????? ?? ??? ????? ?????? ?? ????? ???? ??? ???????? ?????? ??????? ???? ???????. ??? ?? ???? ????? ??????? ?????? ?? ?????? ?? ?????? ???? ?????? ???? ??? ????? ?????? ????? ?? ????. ??? ????? ??? ?????? ????? ?????? ???:  ??? ??????? ???????? ???? ??????.  ??????? ???? ????????.  ????.  ???? ?????? ???? ???? ???? ????????????.  ????? ?????? ???? ??????.  ??????? ?????? ?????? ???? ????? ????? ??? ??? ????? ?? ??? ??????. ??? ???? ???????????? ????? ???? ???????????? ??? ????? ???????? ????? ???? ?? ?? ??? ??? ?? ??????? ????????. ?????? ?? ????? ???????????? ???? ???? ???????? ?? ????. ??? ?? ????? ?? ???? ???? ???????????? ???? ???? ?? ???????? ????? ?? ?? ????. ????? ??? ??????? ??????? ??? ???. ????? ?? ?????? ??????? ??????? ?? ???? ???? ???????????? ???? ????? ???? ??????? ?? ?? ???? ????? ?? ???? ?????. ??? ???? ??????? ????????? ????? ????????? ???????? ???? ?? ???? ???? ?????? ???? (??? ??? ????)? ????? ??? ??? ?????? ????????? ?? ????? ????? ?????? ???????? ?? ???? ????. ???? ??? ???? ???? ????? ?? ???? ??????. ?? ????? ??? ??? ???? ???????? ??????? ??????? ?????? ???? ????? ??????? ?? ????? ?????????.  ?? ?????? ????????? ??????? ?? ??????? ???????: ? ??? ????? ???? ??????? ?????? ??????? ?????? ???? ????? ?????????.  ? ??? ???? ??????? ?? ?????? ?????? ?? ??????? ????? ?? ?????? ??????.  ??? ??? ?????? ????????? ????????: ? ??? ???? ??????? ???? ???????? ??? ????? ??????: ? ????? ???? ??? ???? ??????.  ? ??????? ??? ???? ??????. ? ???? ???? ?????? ?? ?? ????? ???????. ?? ???????? ???????? ????? ??????? ?????? ???? ???  12 ????? (355 ??) ?? ?????? ?? ??? ??? 5 ?????? (148 ??) ?? ?????? ?? ??? ??? ????? ???? (44 ??) ?? ????????? ???????? ??????. ? ???? ?????? ?? ???? ?????? ??? ????? ?? ?????? ???? ?? ????? ??? ????? ?????? ?? ???????? ??? ???????. ?? ?????? ?? ?????? ???????? ?????????  ?????????? ???????? ?????? ?? ????? ??? ???? ????? ?? ????? ???? ?? ????? ??? ????????????. ?? ??????? ??????? ???? ?????? ?? ????? ??? ??????  ????? ?????? ????????? ????????  ???? ??????? ?? ????? ??????? ??? ???? ????????? ???????? ("Nutrition Facts") ??? ??????? ?????????? ???????. ?????? ?? ??? ??????? ???????? ?????? ???????????? ??????? ???????? ???????? ?????? ?????? ??? ?????? ???? ??? ?? ???? ???? ?? ????? ??????? ??????. ?? ???? ?????? ?? ???????? ????? ?????? ??????? ??? ???? ?? ????? ?????.  ???? ?? ?????? ???????????? (?????????) ?? ?? ??? ????.  ???? ?? ??? ?????? ?????? ??????? ??????? ???????? ?? ????? ???????. ????? ??????? ???? ????? ??? ???? ????? ?? ??? ?????? ?? ??????? ????.  ???? ?? ??? ????????? (????) ??? ?????? (????????) ?? ????? ??????? ??????. ????? ??? ???? ??????? ????? ???? ???????? ????? ??? ?? 2,300 ??????? ?? ?????.  ???? ?????? ??? ?????? ??????? ?????? ???????? ??? ??????? ?????? ????? "?????? ??????" "low-fat" ?? "?? ????? ??? ????" ?? "????? ?? ??????" "nonfat". ???? ??????? ?? ????? ??? ????? ???? ?? ????? ?????? ?? ???????????? ??????? ???? ??????.  ???? ??? ??????? ????? ???????? ?????? ??????? ??????? ?? ?????? ???????? ??????? ??? ??????. ??????  ???? ????? ??????? ??????? ?? ?????? ?????? ?? ??????? ????????. ???? ??????? ????? ?????? ??? ????? ????? ?? ????????? ?????? ?????? ???????.  ????? ?? ?????? ??????? ?? ???? ??????? ????????. ???? ?? ?????? ???? ???? ??? ?????? ??????? ?????????? ???????? ??????? ???????? ??????? ???????? ??????? ??????? ???????. ?????  ?????? ?????? ????? ??? ????? ??????? ??? ?????? ????? ?? ????? ??? ????? ????? ??????? ??? ????? ??????.  ?????? ?????? ?????? ?? ????? ??? ???? ??????? ?? ???????? ?? ????? ?????.  ???? ????? ??????? ?? ??????? ?? ?????? ????? ??????. ????? ???????  ????? ??????? ???????? ???????? ??????? ???????? ????? ?? ????? ???? ?? ???. ???? ?????? ???? ????? ??? ????? ???? ???? ????? ?????.  ?????  ??????? ?????? ???????? ??? ??????? ??????? ????????? ?????????? ??????? ???????.  ????? 4-6 ?????? (112-168 ????) ?? ?????????? ????? ?????? ?? ??? ??? ??????? ????? ?????? ?? ?????? ??????? ????????? ?? ?????? ?? ??????. ???????? (???????) (?????? 28 ??) ??????? ?? ?????????? ????? ?????? ??????: ? ????? ????? (28 ????) ?? ?????? ?? ??????? ?? ???????. ? ???? ?????. ?  ??? (62 ??????) ?? ??????.  ????? ?????? ??? ??????? ???? ????? ??? ???? ????? ??? ????????? ????????? ??????? ????????. ?? ??????? ???? ??? ?? ????????? ??????? ?????. ??????. ????????. ?????. ??????. ???????. ?????. ???????. ????????. ??????. ??????. ?????. ????????? ????????? ???????? ??? ?? ??? ???? ???????? ??????? ?? ???????? ?????? ?? ???? ?????? ??????? ?????? ???????? ???????. ???????. ????????. ????????. ?????. ????? ??????. ???????. ??????. ?????. ??????. ??????? ???????? (???? ??????). ?????? ?????? ??????? ??? ????? ?????? ?? ??????? ?????? ??????? ?????????? ?????????? ??????? ?????????. ??????? ??? ???????. ???????. ????? ????? ?? ?????. ?????? ??????????? ?????? ????????? ???????. ??????? ???? ?????. ??? ??????? ?????? ??????. ??????. ????????. ??????. ?????? ??????? ?????? ??????? ?????? ????? ?? ?????? ????? ??? ?????? ???????? ??????. ??????? ???????? ????? ?? ?? ???? ??????? ??????? ?? ????????? ?????????? ???? ????? ???????. ??? ????? ??????? ??????? ?????? ?????? ?? ?????????. ?? ??????? ?????? ??????? ??????? ??????? ??????? ??? ???????. ????????? ????????? ???????. ???????? ??????? ??????? ???? ?????? ?? ???? ???????. ?????? ?????? ?????? ?????? ?????? ??????? ?????? ??? ?????? ?????????? ???????? ??????? ???????. ???? ???? ??????? ????????. ?????? ??????????? ?????? ??? ????? ?????? ??????. ??????? ?? ??????. ????? ?????? ?????? ?? ??????. ?????? ???????. ???? ?????? ???????. ?????? ??????? ????? ?? ??? ?? ???? ???? ?????. ????????? ????????? ???????? ??? ????? ?????? ?? ??????. ???? ?? ????  ??????? ??????? ????? ??????? ??????? ?? ????????? ?????????? ???? ????? ???? ??????. ??? ????? ??????? ??????? ?????? ?????? ?? ?????????. ????? ????? ????? ??? ??????? ??????? ?????? ??????? ??  ?? ????? ??? ??? ???? ?? ??????? ????? ?????? ???? ?????? ?????????  ?? ????? ??? ??????? ??????? ??????  ?? ??? ?????? ???? ?????? ??????? ?? ???? ?? ??? ??????  ?? ???? ??? ???? ?????? ????? ????? ?????? ??? ?????? ?? ?????????:  ??????? ????????? ?????? (  American Diabetes Association):? [email or web address]  ???????? ??????? ???????? ???????? (Academy of Nutrition and Dietetics):? [email or web address]  ?????? ?????? ?????? ?????? ?????? ?????? ?????? [employer] of Diabetes and Digestive and Kidney Diseases):? [email or web address]  ????? ?????? ????? ???? ?????? ???????? (Association of Diabetes Care & Education Specialists):? [email or web address] ????  ?? ??????? ???? ?? ???? ????? ????? ???? ???? ??? ??????? ??? ???? (????????) ????? ???? ???? ??? ????? ??????. ??? ??????? ??? ?????? ????????? ???????? ??? ????.  ??? ??? ??????? ?????? ??????? ??? ??????? ??? ???? ???????? ?? ???? ????? ?? ???????? ??????? ?????? ?????.  ??? ?? ???? ????? ??????? ?????? ?? ?????? ?? ?????? ???? ?????? ???? ??? ????? ?????? ????? ?? ????. ??? ????? ?? ??? ????????? ?? ???? ?????? ????????? ???? ?????? ???? ??????? ??????. ???? ?? ?????? ??? ????? ???? ?? ???? ?? ???? ??????? ??????.? Document Revised: 03/29/2020 Document Reviewed: 03/29/2020 Elsevier Patient Education  2024 [employer] Inc.    Maryagnes Small, MD [facility]

## 2023-12-02 NOTE — Assessment & Plan Note (Signed)
Doing well.  Stable. Continues immunosuppressive treatment Followed at [facility]

## 2023-12-02 NOTE — Assessment & Plan Note (Signed)
Stable.  Continues immunosuppressive treatment Follows up at [facility]

## 2023-12-09 DIAGNOSIS — K319 Disease of stomach and duodenum, unspecified: Secondary | ICD-10-CM | POA: Diagnosis not present

## 2023-12-09 DIAGNOSIS — R1900 Intra-abdominal and pelvic swelling, mass and lump, unspecified site: Secondary | ICD-10-CM | POA: Diagnosis not present

## 2023-12-09 DIAGNOSIS — D696 Thrombocytopenia, unspecified: Secondary | ICD-10-CM | POA: Diagnosis not present

## 2023-12-09 DIAGNOSIS — K219 Gastro-esophageal reflux disease without esophagitis: Secondary | ICD-10-CM | POA: Diagnosis not present

## 2023-12-09 DIAGNOSIS — I110 Hypertensive heart disease with heart failure: Secondary | ICD-10-CM | POA: Diagnosis not present

## 2023-12-09 DIAGNOSIS — I5032 Chronic diastolic (congestive) heart failure: Secondary | ICD-10-CM | POA: Diagnosis not present

## 2023-12-09 DIAGNOSIS — Z940 Kidney transplant status: Secondary | ICD-10-CM | POA: Diagnosis not present

## 2023-12-09 DIAGNOSIS — K3189 Other diseases of stomach and duodenum: Secondary | ICD-10-CM | POA: Diagnosis not present

## 2023-12-09 DIAGNOSIS — Z01818 Encounter for other preprocedural examination: Secondary | ICD-10-CM | POA: Diagnosis not present

## 2023-12-09 DIAGNOSIS — Z7982 Long term (current) use of aspirin: Secondary | ICD-10-CM | POA: Diagnosis not present

## 2023-12-09 DIAGNOSIS — Z794 Long term (current) use of insulin: Secondary | ICD-10-CM | POA: Diagnosis not present

## 2023-12-09 DIAGNOSIS — Z944 Liver transplant status: Secondary | ICD-10-CM | POA: Diagnosis not present

## 2023-12-09 DIAGNOSIS — D849 Immunodeficiency, unspecified: Secondary | ICD-10-CM | POA: Diagnosis not present

## 2023-12-09 DIAGNOSIS — E1165 Type 2 diabetes mellitus with hyperglycemia: Secondary | ICD-10-CM | POA: Diagnosis not present

## 2023-12-09 DIAGNOSIS — D649 Anemia, unspecified: Secondary | ICD-10-CM | POA: Diagnosis not present

## 2023-12-17 ENCOUNTER — Other Ambulatory Visit: Payer: Self-pay | Admitting: Emergency Medicine

## 2023-12-17 DIAGNOSIS — Z794 Long term (current) use of insulin: Secondary | ICD-10-CM

## 2024-01-07 DIAGNOSIS — I110 Hypertensive heart disease with heart failure: Secondary | ICD-10-CM | POA: Diagnosis not present

## 2024-01-07 DIAGNOSIS — Z7982 Long term (current) use of aspirin: Secondary | ICD-10-CM | POA: Diagnosis not present

## 2024-01-07 DIAGNOSIS — Z944 Liver transplant status: Secondary | ICD-10-CM | POA: Diagnosis not present

## 2024-01-07 DIAGNOSIS — D126 Benign neoplasm of colon, unspecified: Secondary | ICD-10-CM | POA: Diagnosis not present

## 2024-01-07 DIAGNOSIS — Z79899 Other long term (current) drug therapy: Secondary | ICD-10-CM | POA: Diagnosis not present

## 2024-01-07 DIAGNOSIS — K621 Rectal polyp: Secondary | ICD-10-CM | POA: Diagnosis not present

## 2024-01-07 DIAGNOSIS — E1140 Type 2 diabetes mellitus with diabetic neuropathy, unspecified: Secondary | ICD-10-CM | POA: Diagnosis not present

## 2024-01-07 DIAGNOSIS — Z794 Long term (current) use of insulin: Secondary | ICD-10-CM | POA: Diagnosis not present

## 2024-01-07 DIAGNOSIS — Z1212 Encounter for screening for malignant neoplasm of rectum: Secondary | ICD-10-CM | POA: Diagnosis not present

## 2024-01-07 DIAGNOSIS — K219 Gastro-esophageal reflux disease without esophagitis: Secondary | ICD-10-CM | POA: Diagnosis not present

## 2024-01-07 DIAGNOSIS — Z86718 Personal history of other venous thrombosis and embolism: Secondary | ICD-10-CM | POA: Diagnosis not present

## 2024-01-07 DIAGNOSIS — Z8616 Personal history of COVID-19: Secondary | ICD-10-CM | POA: Diagnosis not present

## 2024-01-07 DIAGNOSIS — Z7952 Long term (current) use of systemic steroids: Secondary | ICD-10-CM | POA: Diagnosis not present

## 2024-01-07 DIAGNOSIS — Z940 Kidney transplant status: Secondary | ICD-10-CM | POA: Diagnosis not present

## 2024-01-07 DIAGNOSIS — I5030 Unspecified diastolic (congestive) heart failure: Secondary | ICD-10-CM | POA: Diagnosis not present

## 2024-01-07 DIAGNOSIS — D123 Benign neoplasm of transverse colon: Secondary | ICD-10-CM | POA: Diagnosis not present

## 2024-01-07 DIAGNOSIS — D122 Benign neoplasm of ascending colon: Secondary | ICD-10-CM | POA: Diagnosis not present

## 2024-01-07 DIAGNOSIS — K648 Other hemorrhoids: Secondary | ICD-10-CM | POA: Diagnosis not present

## 2024-01-07 DIAGNOSIS — Z79624 Long term (current) use of inhibitors of nucleotide synthesis: Secondary | ICD-10-CM | POA: Diagnosis not present

## 2024-01-07 DIAGNOSIS — Z1211 Encounter for screening for malignant neoplasm of colon: Secondary | ICD-10-CM | POA: Diagnosis not present

## 2024-01-10 ENCOUNTER — Other Ambulatory Visit: Payer: Self-pay | Admitting: Emergency Medicine

## 2024-01-10 DIAGNOSIS — Z794 Long term (current) use of insulin: Secondary | ICD-10-CM

## 2024-02-01 ENCOUNTER — Other Ambulatory Visit: Payer: Self-pay | Admitting: Emergency Medicine

## 2024-02-01 DIAGNOSIS — Z794 Long term (current) use of insulin: Secondary | ICD-10-CM

## 2024-03-05 ENCOUNTER — Telehealth: Payer: Self-pay

## 2024-03-05 NOTE — Telephone Encounter (Signed)
 Patient was identified as falling into the [place] Measure - Diabetes.   Patient was: Appointment already scheduled for:  03/23/24.

## 2024-03-16 DIAGNOSIS — D849 Immunodeficiency, unspecified: Secondary | ICD-10-CM | POA: Diagnosis not present

## 2024-03-16 DIAGNOSIS — Z944 Liver transplant status: Secondary | ICD-10-CM | POA: Diagnosis not present

## 2024-03-16 DIAGNOSIS — Z940 Kidney transplant status: Secondary | ICD-10-CM | POA: Diagnosis not present

## 2024-03-23 ENCOUNTER — Ambulatory Visit

## 2024-03-24 DIAGNOSIS — H52202 Unspecified astigmatism, left eye: Secondary | ICD-10-CM | POA: Diagnosis not present

## 2024-03-24 DIAGNOSIS — E113313 Type 2 diabetes mellitus with moderate nonproliferative diabetic retinopathy with macular edema, bilateral: Secondary | ICD-10-CM | POA: Diagnosis not present

## 2024-03-24 DIAGNOSIS — H31003 Unspecified chorioretinal scars, bilateral: Secondary | ICD-10-CM | POA: Diagnosis not present

## 2024-03-24 LAB — HM DIABETES EYE EXAM

## 2024-03-25 ENCOUNTER — Encounter: Payer: Self-pay | Admitting: Emergency Medicine

## 2024-04-06 DIAGNOSIS — H43813 Vitreous degeneration, bilateral: Secondary | ICD-10-CM | POA: Diagnosis not present

## 2024-04-06 DIAGNOSIS — H31093 Other chorioretinal scars, bilateral: Secondary | ICD-10-CM | POA: Diagnosis not present

## 2024-04-06 DIAGNOSIS — D849 Immunodeficiency, unspecified: Secondary | ICD-10-CM | POA: Diagnosis not present

## 2024-04-06 DIAGNOSIS — E113593 Type 2 diabetes mellitus with proliferative diabetic retinopathy without macular edema, bilateral: Secondary | ICD-10-CM | POA: Diagnosis not present

## 2024-04-06 DIAGNOSIS — Z940 Kidney transplant status: Secondary | ICD-10-CM | POA: Diagnosis not present

## 2024-04-06 DIAGNOSIS — H1789 Other corneal scars and opacities: Secondary | ICD-10-CM | POA: Diagnosis not present

## 2024-04-13 ENCOUNTER — Other Ambulatory Visit: Payer: Self-pay | Admitting: Emergency Medicine

## 2024-04-13 DIAGNOSIS — Z794 Long term (current) use of insulin: Secondary | ICD-10-CM

## 2024-04-13 NOTE — Telephone Encounter (Unsigned)
 Copied from CRM [id number]. Topic: Clinical - Medication Refill >> Apr 13, 2024  2:31 PM Sasha M wrote: Medication: TRESIBA  FLEXTOUCH 100 UNIT/ML FlexTouch Pen  Has the patient contacted their pharmacy? Yes (Agent: If no, request that the patient contact the pharmacy for the refill. If patient does not wish to contact the pharmacy document the reason why and proceed with request.) (Agent: If yes, when and what did the pharmacy advise?) No refills  This is the patient's preferred pharmacy:  [facility] [address] [place] - [address] [place] [address] Phone: [phone] Fax: [phone]  Is this the correct pharmacy for this prescription? Yes If no, delete pharmacy and type the correct one.   Has the prescription been filled recently? No  Is the patient out of the medication? Yes  Has the patient been seen for an appointment in the last year OR does the patient have an upcoming appointment? Yes  Can we respond through MyChart? Yes  Agent: Please be advised that Rx refills may take up to 3 business days. We ask that you follow-up with your pharmacy.

## 2024-04-13 NOTE — Telephone Encounter (Unsigned)
 Copied from CRM [id number]. Topic: Clinical - Medication Refill >> Apr 13, 2024  1:29 PM Dedra B wrote: Medication: TRESIBA  FLEXTOUCH 100 UNIT/ML FlexTouch Pen. Pt will be traveling [place] for 5 months and would like a supply to last him while he is gone.  Has the patient contacted their pharmacy? No (Agent: If no, request that the patient contact the pharmacy for the refill. If patient does not wish to contact the pharmacy document the reason why and proceed with request.) (Agent: If yes, when and what did the pharmacy advise?)  This is the patient's preferred pharmacy:  [facility] [address] [place] - [address] [place] [address] Phone: [phone] Fax: [phone]  Is this the correct pharmacy for this prescription? No requesting a big refill so called office   Has the prescription been filled recently? No  Is the patient out of the medication? Yes  Has the patient been seen for an appointment in the last year OR does the patient have an upcoming appointment? Yes  Can we respond through MyChart? No  Agent: Please be advised that Rx refills may take up to 3 business days. We ask that you follow-up with your pharmacy.

## 2024-04-29 ENCOUNTER — Encounter: Payer: Self-pay | Admitting: *Deleted

## 2024-04-29 NOTE — Progress Notes (Signed)
 William [name]                                          MRN: [id number]   04/29/2024   The [facility]BC[facility] Quality Team Specialist reviewed this patient medical record for the purposes of chart review for care gap closure. The following were reviewed: chart review for care gap closure-glycemic status assessment.    VBCI Quality Team

## 2024-04-30 ENCOUNTER — Other Ambulatory Visit: Payer: Self-pay | Admitting: Emergency Medicine

## 2024-04-30 ENCOUNTER — Ambulatory Visit

## 2024-04-30 DIAGNOSIS — E1121 Type 2 diabetes mellitus with diabetic nephropathy: Secondary | ICD-10-CM

## 2024-05-26 ENCOUNTER — Other Ambulatory Visit: Payer: Self-pay | Admitting: Emergency Medicine

## 2024-05-26 DIAGNOSIS — E1121 Type 2 diabetes mellitus with diabetic nephropathy: Secondary | ICD-10-CM

## 2024-05-26 NOTE — Telephone Encounter (Unsigned)
 Copied from CRM [id number]. Topic: Clinical - Medication Refill >> Apr 13, 2024  1:29 PM Dedra B wrote: Medication: TRESIBA  FLEXTOUCH 100 UNIT/ML FlexTouch Pen. Pt will be traveling overseas for 5 months and would like a supply to last him while he is gone.  Has the patient contacted their pharmacy? No (Agent: If no, request that the patient contact the pharmacy for the refill. If patient does not wish to contact the pharmacy document the reason why and proceed with request.) (Agent: If yes, when and what did the pharmacy advise?)  This is the patient's preferred pharmacy:  [facility] [address], [place] - [address] [place] [address] Phone: [phone] Fax: [phone]  Is this the correct pharmacy for this prescription? No requesting a big refill so called office   Has the prescription been filled recently? No  Is the patient out of the medication? Yes  Has the patient been seen for an appointment in the last year OR does the patient have an upcoming appointment? Yes  Can we respond through MyChart? No  Agent: Please be advised that Rx refills may take up to 3 business days. We ask that you follow-up with your pharmacy. >> May 26, 2024  3:23 PM Macario HERO wrote: Patient son Jurline Pagoda [phone] - Called regarding request for 5 month supply of Tresiba  for patient who is traveling oversees. He states patient is leaving on Monday October 27th and requesting a call back with a status.

## 2024-05-27 NOTE — Progress Notes (Signed)
 William [name]                                          MRN: [id number]   05/27/2024   The VBCI Quality Team Specialist reviewed this patient medical record for the purposes of chart review for care gap closure. The following were reviewed: chart review for care gap closure-glycemic status assessment.    VBCI Quality Team

## 2024-05-27 NOTE — Telephone Encounter (Signed)
 Copied from CRM #[id number]. Topic: Clinical - Medication Question >> May 27, 2024 12:56 PM Viola FALCON wrote: [facility] pharmacy called requesting new script with 3 refills for the TRESIBA  FLEXTOUCH 100 UNIT/ML FlexTouch Pen - patient is going out of town

## 2024-05-28 ENCOUNTER — Telehealth: Payer: Self-pay | Admitting: Emergency Medicine

## 2024-05-28 ENCOUNTER — Other Ambulatory Visit: Payer: Self-pay | Admitting: Emergency Medicine

## 2024-05-28 DIAGNOSIS — E1121 Type 2 diabetes mellitus with diabetic nephropathy: Secondary | ICD-10-CM

## 2024-05-28 NOTE — Telephone Encounter (Signed)
 Copied from CRM [id number]. Topic: Clinical - Medication Question >> May 28, 2024  3:18 PM Shereese L wrote: Reason for CRM: patient is calling to verify the status of medication TRESIBA  FLEXTOUCH 100 UNIT/ML FlexTouch Pen and needs medication approved prior to end of day

## 2024-05-28 NOTE — Telephone Encounter (Signed)
 Copied from CRM [id number]. Topic: Clinical - Medication Refill >> May 28, 2024  1:57 PM Franky GRADE wrote: Medication: TRESIBA  FLEXTOUCH 100 UNIT/ML FlexTouch Pen [[id number]]  Has the patient contacted their pharmacy? No, [facility] is calling to place the order because the patient is going to [place] on Monday 05/31/2024. (Agent: If no, request that the patient contact the pharmacy for the refill. If patient does not wish to contact the pharmacy document the reason why and proceed with request.) (Agent: If yes, when and what did the pharmacy advise?)  This is the patient's preferred pharmacy:  [facility] [address], [place] - [address] [place] [address] Phone: [phone] Fax: [phone]  Is this the correct pharmacy for this prescription? Yes If no, delete pharmacy and type the correct one.   Has the prescription been filled recently? No  Is the patient out of the medication? No  Has the patient been seen for an appointment in the last year OR does the patient have an upcoming appointment? Yes  Can we respond through MyChart? Yes  Agent: Please be advised that Rx refills may take up to 3 business days. We ask that you follow-up with your pharmacy.

## 2024-05-28 NOTE — Telephone Encounter (Unsigned)
 Copied from CRM [id number]. Topic: Clinical - Prescription Issue >> May 28, 2024  5:02 PM Delon T wrote: Reason for CRM: TRESIBA  FLEXTOUCH 100 UNIT/ML FlexTouch Pen- patient had asked for 3 month supply as he will be out of the country  [phone] patient would like a call back

## 2024-05-31 ENCOUNTER — Telehealth: Payer: Self-pay

## 2024-05-31 NOTE — Telephone Encounter (Signed)
 Copied from CRM [id number]. Topic: Clinical - Prescription Issue >> May 28, 2024  4:43 PM Ashley R wrote: Reason for CRM: Requesting callback [phone] in regards to TRESIBA  FLEXTOUCH 100 UNIT/ML FlexTouch Pen request

## 2024-06-01 ENCOUNTER — Other Ambulatory Visit: Payer: Self-pay

## 2024-06-01 DIAGNOSIS — E1121 Type 2 diabetes mellitus with diabetic nephropathy: Secondary | ICD-10-CM

## 2024-06-01 MED ORDER — TRESIBA FLEXTOUCH 100 UNIT/ML ~~LOC~~ SOPN: 14.0000 [IU] | PEN_INJECTOR | Freq: Every day | SUBCUTANEOUS | 3 refills | Status: AC

## 2024-06-01 NOTE — Telephone Encounter (Signed)
 Refilled. Sent to provider for co sign just to be right.

## 2024-06-01 NOTE — Telephone Encounter (Signed)
 Call patient and find out exactly what the question regarding Tresiba  is please.

## 2024-06-01 NOTE — Telephone Encounter (Signed)
 Okay to refill as requested.

## 2024-06-02 ENCOUNTER — Ambulatory Visit: Admitting: Emergency Medicine

## 2024-06-02 ENCOUNTER — Ambulatory Visit

## 2024-07-21 ENCOUNTER — Telehealth: Payer: Self-pay

## 2024-07-21 MED ORDER — LANTUS SOLOSTAR 100 UNIT/ML ~~LOC~~ SOPN: 100.0000 [IU] | PEN_INJECTOR | Freq: Every day | SUBCUTANEOUS | 99 refills | Status: DC

## 2024-07-21 NOTE — Telephone Encounter (Signed)
 Okay to send alternative?

## 2024-07-21 NOTE — Telephone Encounter (Signed)
 Alternative has been sent in

## 2024-07-21 NOTE — Telephone Encounter (Signed)
OK to send in alternative?

## 2024-07-21 NOTE — Telephone Encounter (Signed)
 Copied from CRM #[id number]. Topic: Clinical - Prescription Issue >> Jul 21, 2024 11:50 AM Suzen RAMAN wrote: Reason for CRM: per [employer] patient insurance TRESIBA  FLEXTOUCH 100 UNIT/ML FlexTouch Pen will  no longer be  covered by insurance effective 08/06/23. The other available covered options will be lantus  and toujeo 

## 2024-07-21 NOTE — Addendum Note (Signed)
 Addended by: ROSALVA LEX RAMAN on: 07/21/2024 04:09 PM   Modules accepted: Orders

## 2024-07-22 NOTE — Telephone Encounter (Unsigned)
 Copied from CRM #[id number]. Topic: Clinical - Prescription Issue >> Jul 21, 2024  4:18 PM Thersia BROCKS wrote: Reason for CRM: [facility] called regarding prescription  insulin  glargine (LANTUS  SOLOSTAR) 100 UNIT/ML Solostar Pen  Inject 100 Units into the skin daily. Inject 14 units into skin  needs clarification on how many units to inject

## 2024-07-23 NOTE — Telephone Encounter (Signed)
 Called pharmacy 2nd attempt

## 2024-07-23 NOTE — Telephone Encounter (Signed)
 First attempt failed de to no answer from pharmacy

## 2024-07-27 ENCOUNTER — Other Ambulatory Visit: Payer: Self-pay

## 2024-07-27 MED ORDER — LANTUS SOLOSTAR 100 UNIT/ML ~~LOC~~ SOPN: 100.0000 [IU] | PEN_INJECTOR | Freq: Every day | SUBCUTANEOUS | 99 refills | Status: AC

## 2024-07-27 NOTE — Telephone Encounter (Signed)
 I have resent this in as well as informing the pharmacy

## 2024-08-05 ENCOUNTER — Emergency Department (HOSPITAL_COMMUNITY)

## 2024-08-05 ENCOUNTER — Other Ambulatory Visit: Payer: Self-pay

## 2024-08-05 ENCOUNTER — Encounter (HOSPITAL_COMMUNITY): Payer: Self-pay | Admitting: Family Medicine

## 2024-08-05 ENCOUNTER — Observation Stay (HOSPITAL_COMMUNITY)
Admission: EM | Admit: 2024-08-05 | Discharge: 2024-08-06 | Disposition: A | Source: Home / Self Care | Attending: Emergency Medicine | Admitting: Emergency Medicine

## 2024-08-05 DIAGNOSIS — Z992 Dependence on renal dialysis: Secondary | ICD-10-CM | POA: Diagnosis not present

## 2024-08-05 DIAGNOSIS — I132 Hypertensive heart and chronic kidney disease with heart failure and with stage 5 chronic kidney disease, or end stage renal disease: Secondary | ICD-10-CM | POA: Diagnosis not present

## 2024-08-05 DIAGNOSIS — I509 Heart failure, unspecified: Secondary | ICD-10-CM | POA: Diagnosis not present

## 2024-08-05 DIAGNOSIS — Z944 Liver transplant status: Secondary | ICD-10-CM | POA: Insufficient documentation

## 2024-08-05 DIAGNOSIS — Z940 Kidney transplant status: Secondary | ICD-10-CM

## 2024-08-05 DIAGNOSIS — J181 Lobar pneumonia, unspecified organism: Principal | ICD-10-CM

## 2024-08-05 DIAGNOSIS — Z794 Long term (current) use of insulin: Secondary | ICD-10-CM | POA: Diagnosis not present

## 2024-08-05 DIAGNOSIS — Z79899 Other long term (current) drug therapy: Secondary | ICD-10-CM | POA: Diagnosis not present

## 2024-08-05 DIAGNOSIS — E1122 Type 2 diabetes mellitus with diabetic chronic kidney disease: Secondary | ICD-10-CM | POA: Diagnosis not present

## 2024-08-05 DIAGNOSIS — J189 Pneumonia, unspecified organism: Principal | ICD-10-CM | POA: Diagnosis present

## 2024-08-05 DIAGNOSIS — A419 Sepsis, unspecified organism: Secondary | ICD-10-CM | POA: Diagnosis present

## 2024-08-05 DIAGNOSIS — E1165 Type 2 diabetes mellitus with hyperglycemia: Secondary | ICD-10-CM | POA: Diagnosis not present

## 2024-08-05 DIAGNOSIS — N186 End stage renal disease: Secondary | ICD-10-CM | POA: Insufficient documentation

## 2024-08-05 DIAGNOSIS — D61818 Other pancytopenia: Secondary | ICD-10-CM | POA: Diagnosis not present

## 2024-08-05 DIAGNOSIS — R531 Weakness: Secondary | ICD-10-CM | POA: Diagnosis present

## 2024-08-05 DIAGNOSIS — M7989 Other specified soft tissue disorders: Secondary | ICD-10-CM | POA: Diagnosis not present

## 2024-08-05 LAB — CBC WITH DIFFERENTIAL/PLATELET
Basophils Absolute: 0 K/uL (ref 0.0–0.1)
Basophils Relative: 1 %
Eosinophils Absolute: 0 K/uL (ref 0.0–0.5)
Eosinophils Relative: 2 %
HCT: 23.1 % — ABNORMAL LOW (ref 39.0–52.0)
Hemoglobin: 7.3 g/dL — ABNORMAL LOW (ref 13.0–17.0)
Lymphocytes Relative: 24 %
Lymphs Abs: 0.6 K/uL — ABNORMAL LOW (ref 0.7–4.0)
MCH: 23.9 pg — ABNORMAL LOW (ref 26.0–34.0)
MCHC: 31.6 g/dL (ref 30.0–36.0)
MCV: 75.5 fL — ABNORMAL LOW (ref 80.0–100.0)
Monocytes Absolute: 0.6 K/uL (ref 0.1–1.0)
Monocytes Relative: 26 %
Neutro Abs: 1.1 K/uL — ABNORMAL LOW (ref 1.7–7.7)
Neutrophils Relative %: 47 %
Platelets: 137 K/uL — ABNORMAL LOW (ref 150–400)
RBC: 3.06 MIL/uL — ABNORMAL LOW (ref 4.22–5.81)
RDW: 17.1 % — ABNORMAL HIGH (ref 11.5–15.5)
Smear Review: NORMAL
WBC: 2.3 K/uL — ABNORMAL LOW (ref 4.0–10.5)
nRBC: 0 % (ref 0.0–0.2)

## 2024-08-05 LAB — URINALYSIS, W/ REFLEX TO CULTURE (INFECTION SUSPECTED)
Bacteria, UA: NONE SEEN
Bilirubin Urine: NEGATIVE
Glucose, UA: >=500 mg/dL — AB
Ketones, ur: NEGATIVE mg/dL
Leukocytes,Ua: NEGATIVE
Nitrite: NEGATIVE
Protein, ur: NEGATIVE mg/dL
RBC / HPF: >50 RBC/hpf (ref 0–5)
Specific Gravity, Urine: 1.017 (ref 1.005–1.030)
pH: 5 (ref 5.0–8.0)

## 2024-08-05 LAB — COMPREHENSIVE METABOLIC PANEL WITH GFR
ALT: <5 U/L (ref 0–44)
AST: 10 U/L — ABNORMAL LOW (ref 15–41)
Albumin: 2.8 g/dL — ABNORMAL LOW (ref 3.5–5.0)
Alkaline Phosphatase: 63 U/L (ref 38–126)
Anion gap: 7 (ref 5–15)
BUN: 35 mg/dL — ABNORMAL HIGH (ref 8–23)
CO2: 21 mmol/L — ABNORMAL LOW (ref 22–32)
Calcium: 7.5 mg/dL — ABNORMAL LOW (ref 8.9–10.3)
Chloride: 108 mmol/L (ref 98–111)
Creatinine, Ser: 0.98 mg/dL (ref 0.61–1.24)
GFR, Estimated: >60 mL/min
Glucose, Bld: 65 mg/dL — ABNORMAL LOW (ref 70–99)
Potassium: 3.9 mmol/L (ref 3.5–5.1)
Sodium: 136 mmol/L (ref 135–145)
Total Bilirubin: 0.7 mg/dL (ref 0.0–1.2)
Total Protein: 6.2 g/dL — ABNORMAL LOW (ref 6.5–8.1)

## 2024-08-05 LAB — I-STAT CG4 LACTIC ACID, ED: Lactic Acid, Venous: 1.4 mmol/L (ref 0.5–1.9)

## 2024-08-05 LAB — RESP PANEL BY RT-PCR (RSV, FLU A&B, COVID)  RVPGX2
Influenza A by PCR: NEGATIVE
Influenza B by PCR: NEGATIVE
Resp Syncytial Virus by PCR: NEGATIVE
SARS Coronavirus 2 by RT PCR: NEGATIVE

## 2024-08-05 LAB — I-STAT CHEM 8, ED
BUN: 48 mg/dL — ABNORMAL HIGH (ref 8–23)
Calcium, Ion: 1.14 mmol/L — ABNORMAL LOW (ref 1.15–1.40)
Chloride: 101 mmol/L (ref 98–111)
Creatinine, Ser: 1.3 mg/dL — ABNORMAL HIGH (ref 0.61–1.24)
Glucose, Bld: 73 mg/dL (ref 70–99)
HCT: 32 % — ABNORMAL LOW (ref 39.0–52.0)
Hemoglobin: 10.9 g/dL — ABNORMAL LOW (ref 13.0–17.0)
Potassium: 4.9 mmol/L (ref 3.5–5.1)
Sodium: 135 mmol/L (ref 135–145)
TCO2: 24 mmol/L (ref 22–32)

## 2024-08-05 LAB — CBG MONITORING, ED
Glucose-Capillary: 84 mg/dL (ref 70–99)
Glucose-Capillary: 116 mg/dL — ABNORMAL HIGH (ref 70–99)

## 2024-08-05 LAB — AMMONIA: Ammonia: 14 umol/L (ref 9–35)

## 2024-08-05 MED ORDER — SODIUM CHLORIDE 0.9 % IV SOLN
500.0000 mg | Freq: Every day | INTRAVENOUS | Status: DC
  Administered 2024-08-06: 500 mg via INTRAVENOUS
  Filled 2024-08-05: qty 5

## 2024-08-05 MED ORDER — ONDANSETRON HCL 4 MG/2ML IJ SOLN
4.0000 mg | Freq: Once | INTRAMUSCULAR | Status: AC
  Administered 2024-08-05: 4 mg via INTRAVENOUS
  Filled 2024-08-05: qty 2

## 2024-08-05 MED ORDER — PREDNISONE 5 MG PO TABS
5.0000 mg | ORAL_TABLET | Freq: Every day | ORAL | Status: DC
  Administered 2024-08-06: 5 mg via ORAL
  Filled 2024-08-05: qty 1

## 2024-08-05 MED ORDER — INSULIN ASPART 100 UNIT/ML IJ SOLN
3.0000 [IU] | Freq: Three times a day (TID) | INTRAMUSCULAR | Status: DC
  Filled 2024-08-05: qty 3

## 2024-08-05 MED ORDER — MYCOPHENOLATE MOFETIL 250 MG PO CAPS: 250.0000 mg | ORAL_CAPSULE | Freq: Two times a day (BID) | ORAL | Status: DC

## 2024-08-05 MED ORDER — MYCOPHENOLATE MOFETIL 250 MG PO CAPS
500.0000 mg | ORAL_CAPSULE | Freq: Every day | ORAL | Status: DC
  Administered 2024-08-06: 500 mg via ORAL
  Filled 2024-08-05: qty 2

## 2024-08-05 MED ORDER — CYCLOSPORINE MODIFIED (GENGRAF) 25 MG PO CAPS
25.0000 mg | ORAL_CAPSULE | Freq: Two times a day (BID) | ORAL | Status: DC
  Filled 2024-08-05: qty 1

## 2024-08-05 MED ORDER — INSULIN GLARGINE-YFGN 100 UNIT/ML ~~LOC~~ SOLN
10.0000 [IU] | Freq: Every day | SUBCUTANEOUS | Status: DC
  Filled 2024-08-05: qty 0.1

## 2024-08-05 MED ORDER — VANCOMYCIN HCL IN DEXTROSE 1-5 GM/200ML-% IV SOLN
1000.0000 mg | Freq: Once | INTRAVENOUS | Status: AC
  Administered 2024-08-05: 1000 mg via INTRAVENOUS
  Filled 2024-08-05: qty 200

## 2024-08-05 MED ORDER — SODIUM CHLORIDE 0.9 % IV SOLN
2.0000 g | INTRAVENOUS | Status: DC
  Administered 2024-08-06: 2 g via INTRAVENOUS
  Filled 2024-08-05: qty 20

## 2024-08-05 MED ORDER — SODIUM CHLORIDE 0.9 % IV BOLUS
1000.0000 mL | Freq: Once | INTRAVENOUS | Status: AC
  Administered 2024-08-05: 1000 mL via INTRAVENOUS

## 2024-08-05 MED ORDER — IOHEXOL 350 MG/ML SOLN
100.0000 mL | Freq: Once | INTRAVENOUS | Status: AC | PRN
  Administered 2024-08-05: 100 mL via INTRAVENOUS

## 2024-08-05 MED ORDER — POLYETHYLENE GLYCOL 3350 17 G PO PACK: 17.0000 g | PACK | Freq: Every day | ORAL | Status: DC | PRN

## 2024-08-05 MED ORDER — INSULIN ASPART 100 UNIT/ML IJ SOLN
0.0000 [IU] | Freq: Three times a day (TID) | INTRAMUSCULAR | Status: DC
  Administered 2024-08-06: 2 [IU] via SUBCUTANEOUS
  Filled 2024-08-05 (×2): qty 2

## 2024-08-05 MED ORDER — CYCLOSPORINE MODIFIED (NEORAL) 100 MG PO CAPS
100.0000 mg | ORAL_CAPSULE | Freq: Two times a day (BID) | ORAL | Status: DC
  Administered 2024-08-06 (×2): 100 mg via ORAL
  Filled 2024-08-05 (×2): qty 1

## 2024-08-05 MED ORDER — MYCOPHENOLATE MOFETIL 250 MG PO CAPS
250.0000 mg | ORAL_CAPSULE | Freq: Every day | ORAL | Status: DC
  Administered 2024-08-06: 250 mg via ORAL
  Filled 2024-08-05: qty 1

## 2024-08-05 MED ORDER — CYCLOSPORINE MODIFIED (GENGRAF) 100 MG PO CAPS
100.0000 mg | ORAL_CAPSULE | Freq: Two times a day (BID) | ORAL | Status: DC
  Filled 2024-08-05: qty 1

## 2024-08-05 MED ORDER — ACETAMINOPHEN 650 MG RE SUPP: 650.0000 mg | Freq: Four times a day (QID) | RECTAL | Status: DC | PRN

## 2024-08-05 MED ORDER — ACETAMINOPHEN 325 MG PO TABS: 650.0000 mg | ORAL_TABLET | Freq: Four times a day (QID) | ORAL | Status: DC | PRN

## 2024-08-05 MED ORDER — SODIUM CHLORIDE 0.9 % IV SOLN
2.0000 g | Freq: Once | INTRAVENOUS | Status: AC
  Administered 2024-08-05: 2 g via INTRAVENOUS
  Filled 2024-08-05: qty 12.5

## 2024-08-05 MED ORDER — LACTATED RINGERS IV SOLN: INTRAVENOUS | Status: DC

## 2024-08-05 MED ORDER — SODIUM CHLORIDE 0.9 % IV SOLN: Freq: Once | INTRAVENOUS | Status: AC

## 2024-08-05 MED ORDER — ENOXAPARIN SODIUM 40 MG/0.4ML IJ SOSY: 40.0000 mg | PREFILLED_SYRINGE | INTRAMUSCULAR | Status: DC

## 2024-08-05 MED ORDER — CYCLOSPORINE MODIFIED (NEORAL) 25 MG PO CAPS
25.0000 mg | ORAL_CAPSULE | Freq: Two times a day (BID) | ORAL | Status: DC
  Administered 2024-08-06 (×2): 25 mg via ORAL
  Filled 2024-08-05 (×2): qty 1

## 2024-08-05 NOTE — Progress Notes (Signed)
 A consult was received from an ED physician for vancomycin  and cefepime  per pharmacy dosing (for an indication other than meningitis). The patient's profile has been reviewed for ht/wt/allergies/indication/available labs. A one time order has been placed for the above antibiotics.  Further antibiotics/pharmacy consults should be ordered by admitting physician if indicated.                       Bard Jeans, PharmD, BCPS [phone] 08/05/2024, 6:20 PM

## 2024-08-05 NOTE — ED Provider Notes (Signed)
 " [place] EMERGENCY DEPARTMENT AT [facility] Provider Note   CSN: [id number] Arrival date & time: 08/05/24  1456     Patient presents with: No chief complaint on file.   William [name] is a 74 y.o. male.   74 year old male with prior medical history as detailed below presents for evaluation.  Patient is companied by family.  Patient with recent travel to [place] over the last 2 months.  He returned yesterday after being in [place] visiting his family.  He apparently has developed cough, fever, malaise, fatigue over the last 2 to 3 days.  Patient with increased weakness today.  Family reports the patient is unable to ambulate secondary to his weakness.    Patient with prior medical history including renal and liver transplant.  He is apparently compliant with previous prescribed medications including immunosuppressants.  Prior transplant care at [facility].  Patient with chronic bilateral lower extremity edema.  Apparently this edema has worsened over the last months while he has been in [place].  Other prior medical history includes cirrhosis, CHF, diabetes, hypertension, esophageal varices, Goodpasture syndrome.  The history is provided by the patient and medical records.       Prior to Admission medications  Medication Sig Start Date End Date Taking? Authorizing Provider  acetaminophen  (TYLENOL ) 650 MG CR tablet Take 650 mg by mouth every 8 (eight) hours as needed for pain.    [provider]  calcium  acetate (PHOSLO ) 667 MG tablet Take 667 mg by mouth 3 (three) times daily. Patient not taking: Reported on 10/08/2022 05/09/21   [provider]  Calcium  Citrate-Vitamin D 315-5 MG-MCG TABS Take by mouth. 10/03/21   [provider]  ciprofloxacin -dexamethasone  (CIPRODEX ) OTIC suspension Place 4 drops into the right ear 2 (two) times daily. 12/02/23   Purcell Emil Schanz, MD  GENGRAF  100 MG capsule Take 100 mg by mouth 2 (two) times daily. 02/26/22   [provider]  GENGRAF  25 MG capsule Take by mouth. 02/26/22   [provider]  insulin  glargine (LANTUS  SOLOSTAR) 100 UNIT/ML Solostar Pen Inject 100 Units into the skin daily. Inject 14 units into skin 07/27/24   Sagardia, Miguel Jose, MD  Insulin  Pen Needle (ADVOCATE INSULIN  PEN NEEDLES) 31G X 5 MM MISC Use four times daily to inject insulin  04/27/18   Von Pacific, MD  insulin  regular (NOVOLIN R,HUMULIN R ) 100 units/mL injection Inject 6-8 Units into the skin See admin instructions. Inject 8 units subcutaneously with breakfast, and 6 units with lunch and supper Sliding scale    [provider]  lactulose  (CHRONULAC ) 10 GM/15ML solution Take 45 mLs (30 g total) by mouth 3 (three) times daily. Patient not taking: Reported on 03/26/2023 06/14/21   Sagardia, Miguel Jose, MD  magnesium oxide (MAG-OX) 400 MG tablet Take by mouth. 02/26/22   [provider]  Multiple Vitamin (MULTIVITAMIN WITH MINERALS) TABS tablet Take 1 tablet by mouth daily.    [provider]  mupirocin  ointment (BACTROBAN ) 2 % Apply 1 application topically daily. 05/30/21   Purcell Emil Schanz, MD  mycophenolate  (CELLCEPT ) 250 MG capsule Take 500 mg by mouth 2 (two) times daily. 02/26/22   [provider]  ondansetron  (ZOFRAN  ODT) 4 MG disintegrating tablet Take 1 tablet (4 mg total) by mouth 2 (two) times daily as needed for nausea or vomiting. 05/01/20   [name], Hallie C, PA-C  ONE TOUCH LANCETS MISC Use to test blood sugar three times daily 04/27/18   Von Pacific, MD  ONETOUCH VERIO test strip USE 1 STRIP TO CHECK GLUCOSE THREE TIMES DAILY 07/23/21   Von Pacific, MD  predniSONE  (DELTASONE ) 5 MG tablet Take by mouth. 02/15/22   [provider]  TRESIBA  FLEXTOUCH 100 UNIT/ML FlexTouch Pen Inject 14 Units into the skin daily. 06/01/24   Purcell Emil Schanz, MD    Allergies: Heparin  and Porcine (pork) protein-containing drug products    Review of Systems  All other systems  reviewed and are negative.   Updated Vital Signs BP (!) 92/53 (BP Location: Right Arm)   Pulse (!) 107   Temp 98.7 F (37.1 C) (Oral)   Resp (!) 22   Ht 5' 5 (1.651 m)   Wt 70.3 kg   SpO2 98%   BMI 25.79 kg/m   Physical Exam Vitals and nursing note reviewed.  Constitutional:      General: He is not in acute distress.    Appearance: He is well-developed.     Comments: Alert, coughing  HENT:     Head: Normocephalic and atraumatic.  Eyes:     Conjunctiva/sclera: Conjunctivae normal.  Cardiovascular:     Rate and Rhythm: Normal rate and regular rhythm.     Heart sounds: No murmur heard. Pulmonary:     Effort: Pulmonary effort is normal. No respiratory distress.     Breath sounds: Normal breath sounds.  Abdominal:     Palpations: Abdomen is soft.     Tenderness: There is no abdominal tenderness.  Musculoskeletal:        General: No swelling. Normal range of motion.     Cervical back: Neck supple.     Comments: Edema bilaterally   Skin:    General: Skin is warm and dry.     Capillary Refill: Capillary refill takes less than 2 seconds.  Neurological:     Mental Status: He is alert.  Psychiatric:        Mood and Affect: Mood normal.     (all labs ordered are listed, but only abnormal results are displayed) Labs Reviewed - No data to display  EKG: None  Radiology: No results found.   Procedures   Medications Ordered in the ED - No data to display                                  Medical Decision Making Patient presents with malaise, fatigue, cough, decreased p.o. intake x 2 to 3 days.  He just returned from [place] yesterday.  He has been in [place] for the last 2 months.  He is immunosuppressed status post liver and kidney transplant.  Initial vitals are concerning with tachycardia and hypotension.  Patient was nearly febrile with initial temp documented at 99.8 rectally.  Out of concern for sever infection/sepsis, broad-spectrum antibiotics  administered.  IV fluids administered.  Notable findings on workup include presence of likely infiltrate in right lung.  COVID and flu testing is negative.  White count is 2.3.  Hemoglobin is 7.3 Initial BUN is 48 with a creatinine of 1.3.  Lactic acid is 1.4. No PE identified on CTA.  Patient reports feeling improved at time of admission.  Patient would benefit from admission.  Hospitalist service is aware of case.  Amount and/or Complexity of Data Reviewed Labs: ordered. Radiology: ordered.  Risk Prescription drug management.   CRITICAL CARE Performed by: Maude JAYSON Galloway   Total critical care time: 30 minutes  Critical care  time was exclusive of separately billable procedures and treating other patients.  Critical care was necessary to treat or prevent imminent or life-threatening deterioration.  Critical care was time spent personally by me on the following activities: development of treatment plan with patient and/or surrogate as well as nursing, discussions with consultants, evaluation of patient's response to treatment, examination of patient, obtaining history from patient or surrogate, ordering and performing treatments and interventions, ordering and review of laboratory studies, ordering and review of radiographic studies, pulse oximetry and re-evaluation of patient's condition.      Final diagnoses:  Pneumonia due to infectious organism, unspecified laterality, unspecified part of lung    ED Discharge Orders     None          Laurice Maude BROCKS, MD 08/05/24 2136  "

## 2024-08-05 NOTE — ED Notes (Signed)
 Delay d/t not being able to obtain labs.  IV team placed 22G IV but unable to obtain full labs and blood cultures.  Pt is in NAD. Other staff to attempt to obtain blood cultures and lab work.

## 2024-08-05 NOTE — ED Triage Notes (Signed)
 Patient BIB EMS C/o swelling left lower leg x 1 month Flu like symptoms x 3 days - cough fever malaise nausea Flew in from [place] yesterday  VS WNL w EMS  Per EMS family says patient has had dry cough for decades

## 2024-08-05 NOTE — ED Notes (Signed)
 Unable to obtain blood cultures prior to giving antibiotics.

## 2024-08-05 NOTE — H&P (Addendum)
 " History and Physical    William [name] FMW:[id number] DOB: 04-08-1951 DOA: 08/05/2024  PCP: Purcell Emil Schanz, MD  Patient coming from: Home  Chief Complaint: Weakness  HPI: William [name] is a 74 y.o. male with medical history significant of kidney and liver transplant (08/10/2021 at [facility]), diabetes on insulin .  He is no longer on dialysis.  His son is at the bedside and helps interpret (Arabic).  He has had weakness and malaise over the past couple days.  He is having a cough and worsening shortness of breath as well.  He returned from [place] yesterday.  He has an associated runny nose, nausea, vomiting, and congestion.  The patient has also been dealing with recurrent swelling in his lower extremities over the past 4 months.  He has also been having intermittent pain throughout this time as well.  This started prior to his travel to [place].  No sinus pain, ear pain/drainage, fevers, wheezing, diarrhea, known sick contacts, sore throat, itchy/watery eyes, myalgias.  Reports compliance with immunosuppressive therapy.  He has overall been eating and drinking normally with the exception of today.  ED Course: CTA chest did not reveal any embolism but is suspicious for lobar pneumonia of the right upper lobe.  Vancomycin  and cefepime  were started.  He is not requiring oxygen .  Respiratory rate in the low 20s, mild tachycardia in the low 100s.  Initial labs showed an elevated creatinine around 1.3 with a 1.0 baseline.  This resolved after fluids were initiated.  White count is 2.3, hemoglobin 7.3, platelets 137.  Lower extremity duplex ordered to rule out DVT.  Review of Systems: As per HPI otherwise 10 point review of systems negative.   Past Medical History:  Diagnosis Date   Anemia    Blind one eye    CHF (congestive heart failure) (HCC)    Cirrhosis (HCC)    ESRD on hemodialysis (HCC)    T, Th, Sat. [place]   Goodpasture's disease [facility])    on outpatient plasmapheresis/notes 11/20/2017    Grade I diastolic dysfunction 10/14/2017   History of anemia due to chronic kidney disease    History of blood transfusion X 1   UGIB; low blood count   History of plasmapheresis    qod (11/20/2017)   Hypertension    Pancytopenia (HCC) 08/20/2017   Type II diabetes mellitus [facility])     Past Surgical History:  Procedure Laterality Date   A/V FISTULAGRAM Left 10/16/2020   Procedure: A/V FISTULAGRAM;  Surgeon: Sheree Penne Bruckner, MD;  Location: [facility] INVASIVE CV LAB;  Service: Cardiovascular;  Laterality: Left;   AMPUTATION TOE Left 10/07/2020   Procedure: AMPUTATION THIRD TOE;  Surgeon: Silva Juliene SAUNDERS, DPM;  Location: [facility] OR;  Service: Podiatry;  Laterality: Left;   AV FISTULA PLACEMENT Left 12/26/2017   Procedure: LEFT BRACHIOCEPHALIC ARTERIOVENOUS (AV) FISTULA CREATION;  Surgeon: Laurence Redell CROME, MD;  Location: [facility] OR;  Service: Vascular;  Laterality: Left;   AV FISTULA PLACEMENT Left 11/02/2020   Procedure: INSERTION OF ARTERIOVENOUS (AV) GORE-TEX GRAFT ARM;  Surgeon: Sheree Penne Bruckner, MD;  Location: [facility] OR;  Service: Vascular;  Laterality: Left;   AV FISTULA PLACEMENT Right 06/01/2021   Procedure: RIGHT ARM BRACHIOBASILIC ARTERIOVENOUS (AV) FISTULA CREATION;  Surgeon: Sheree Penne Bruckner, MD;  Location: [facility] OR;  Service: Vascular;  Laterality: Right;   BASCILIC VEIN TRANSPOSITION Left 02/16/2018   Procedure: BRACHIOBASILIC VEIN TRANSPOSITION SECOND STAGE;  Surgeon: Laurence Redell CROME, MD;  Location: [facility] OR;  Service:  Vascular;  Laterality: Left;   BASCILIC VEIN TRANSPOSITION Right 07/27/2021   Procedure: SECOND STAGE RIGHT ARM BASILIC VEIN TRANSPOSITION;  Surgeon: Magda Debby SAILOR, MD;  Location: [facility] OR;  Service: Vascular;  Laterality: Right;   CATARACT EXTRACTION W/ INTRAOCULAR LENS  IMPLANT, BILATERAL Bilateral    ESOPHAGEAL BANDING N/A 04/01/2018   Procedure: ESOPHAGEAL BANDING;  Surgeon: Elicia Claw, MD;  Location: [facility] ENDOSCOPY;  Service: Gastroenterology;  Laterality:  N/A;   ESOPHAGEAL BANDING N/A 06/29/2018   Procedure: ESOPHAGEAL BANDING;  Surgeon: Elicia Claw, MD;  Location: [facility] ENDOSCOPY;  Service: Gastroenterology;  Laterality: N/A;   ESOPHAGOGASTRODUODENOSCOPY (EGD) WITH PROPOFOL  N/A 04/01/2018   Procedure: ESOPHAGOGASTRODUODENOSCOPY (EGD) WITH PROPOFOL ;  Surgeon: Elicia Claw, MD;  Location: [facility] ENDOSCOPY;  Service: Gastroenterology;  Laterality: N/A;   ESOPHAGOGASTRODUODENOSCOPY (EGD) WITH PROPOFOL  N/A 06/29/2018   Procedure: ESOPHAGOGASTRODUODENOSCOPY (EGD) WITH PROPOFOL ;  Surgeon: Elicia Claw, MD;  Location: [facility] ENDOSCOPY;  Service: Gastroenterology;  Laterality: N/A;   ESOPHAGOGASTRODUODENOSCOPY (EGD) WITH PROPOFOL  N/A 09/30/2018   Procedure: ESOPHAGOGASTRODUODENOSCOPY (EGD) WITH PROPOFOL ;  Surgeon: Elicia Claw, MD;  Location: [facility] ENDOSCOPY;  Service: Gastroenterology;  Laterality: N/A;   ESOPHAGOGASTRODUODENOSCOPY (EGD) WITH PROPOFOL  N/A 04/20/2019   Procedure: ESOPHAGOGASTRODUODENOSCOPY (EGD) WITH PROPOFOL ;  Surgeon: Elicia Claw, MD;  Location: [facility] ENDOSCOPY;  Service: Gastroenterology;  Laterality: N/A;   ESOPHAGOGASTRODUODENOSCOPY (EGD) WITH PROPOFOL  N/A 04/17/2020   Procedure: ESOPHAGOGASTRODUODENOSCOPY (EGD) WITH PROPOFOL ;  Surgeon: Lennard Lesta FALCON, MD;  Location: [facility] ENDOSCOPY;  Service: Gastroenterology;  Laterality: N/A;   I & D EXTREMITY Left 02/18/2018   Procedure: IRRIGATION AND DEBRIDEMENT LEFT ARM;  Surgeon: Serene Gaile ORN, MD;  Location: [facility] OR;  Service: Vascular;  Laterality: Left;   INSERTION OF DIALYSIS CATHETER N/A 01/19/2018   Procedure: INSERTION OF DIALYSIS CATHETER - RIGHT INTERNAL JUGULAR PLACEMENT;  Surgeon: Oris Krystal FALCON, MD;  Location: [facility] OR;  Service: Vascular;  Laterality: N/A;   INSERTION OF DIALYSIS CATHETER Right 11/02/2020   Procedure: INSERTION OF TUNNELED DIALYSIS CATHETER;  Surgeon: Sheree Penne Bruckner, MD;  Location: [facility] OR;  Service: Vascular;  Laterality: Right;   IR FLUORO GUIDE CV LINE  RIGHT  11/10/2017   IR PARACENTESIS  12/10/2017   IR PARACENTESIS  12/26/2017   IR REMOVAL TUN CV CATH W/O FL  11/28/2017   IR THROMBECTOMY AV FISTULA W/THROMBOLYSIS/PTA INC/SHUNT/IMG LEFT Left 06/02/2019   IR US  GUIDE VASC ACCESS LEFT  06/02/2019   IR US  GUIDE VASC ACCESS RIGHT  11/10/2017   NECK SURGERY  2007   back of neck; no hardware in there   REVISON OF ARTERIOVENOUS FISTULA Left 11/02/2020   Procedure: REVISON OF ARTERIOVENOUS FISTULA;  Surgeon: Sheree Penne Bruckner, MD;  Location: [facility] OR;  Service: Vascular;  Laterality: Left;     reports that he has never smoked. He has never used smokeless tobacco. He reports that he does not drink alcohol  and does not use drugs.  Allergies[1]  Family History  Problem Relation Age of Onset   Diabetes Mellitus II Sister    Diabetes Sister    Stroke Brother    Heart attack Brother     Prior to Admission medications  Medication Sig Start Date End Date Taking? Authorizing Provider  acetaminophen  (TYLENOL ) 650 MG CR tablet Take 650 mg by mouth every 8 (eight) hours as needed for pain.    [provider]  calcium  acetate (PHOSLO ) 667 MG tablet Take 667 mg by mouth 3 (three) times daily. Patient not taking: Reported on 10/08/2022 05/09/21   [provider]  Calcium   Citrate-Vitamin D 315-5 MG-MCG TABS Take by mouth. 10/03/21   [provider]  ciprofloxacin -dexamethasone  (CIPRODEX ) OTIC suspension Place 4 drops into the right ear 2 (two) times daily. 12/02/23   Purcell Emil Schanz, MD  GENGRAF  100 MG capsule Take 100 mg by mouth 2 (two) times daily. 02/26/22   [provider]  GENGRAF  25 MG capsule Take by mouth. 02/26/22   [provider]  insulin  glargine (LANTUS  SOLOSTAR) 100 UNIT/ML Solostar Pen Inject 100 Units into the skin daily. Inject 14 units into skin 07/27/24   Purcell Emil Schanz, MD  Insulin  Pen Needle (ADVOCATE INSULIN  PEN NEEDLES) 31G X 5 MM MISC Use four times daily to inject insulin  04/27/18    Von Pacific, MD  insulin  regular (NOVOLIN R,HUMULIN R ) 100 units/mL injection Inject 6-8 Units into the skin See admin instructions. Inject 8 units subcutaneously with breakfast, and 6 units with lunch and supper Sliding scale    [provider]  lactulose  (CHRONULAC ) 10 GM/15ML solution Take 45 mLs (30 g total) by mouth 3 (three) times daily. Patient not taking: Reported on 03/26/2023 06/14/21   Sagardia, Miguel Jose, MD  magnesium oxide (MAG-OX) 400 MG tablet Take by mouth. 02/26/22   [provider]  Multiple Vitamin (MULTIVITAMIN WITH MINERALS) TABS tablet Take 1 tablet by mouth daily.    [provider]  mupirocin  ointment (BACTROBAN ) 2 % Apply 1 application topically daily. 05/30/21   Purcell Emil Schanz, MD  mycophenolate  (CELLCEPT ) 250 MG capsule Take 500 mg by mouth 2 (two) times daily. 02/26/22   [provider]  ondansetron  (ZOFRAN  ODT) 4 MG disintegrating tablet Take 1 tablet (4 mg total) by mouth 2 (two) times daily as needed for nausea or vomiting. 05/01/20   [name], Hallie C, PA-C  ONE TOUCH LANCETS MISC Use to test blood sugar three times daily 04/27/18   Von [place], MD  [facility] VERIO test strip USE 1 STRIP TO CHECK GLUCOSE THREE TIMES DAILY 07/23/21   Von Pacific, MD  predniSONE  (DELTASONE ) 5 MG tablet Take by mouth. 02/15/22   [provider]  TRESIBA  FLEXTOUCH 100 UNIT/ML FlexTouch Pen Inject 14 Units into the skin daily. 06/01/24   Purcell Emil Schanz, MD    Physical Exam: Vitals:   08/05/24 1823 08/05/24 1830 08/05/24 2000 08/05/24 2215  BP:  (!) 160/73 (!) 154/62   Pulse:  (!) 119 (!) 103   Resp:  15 20 18   Temp: 99.8 F (37.7 C)   99.3 F (37.4 C)  TempSrc: Rectal   Oral  SpO2:  100% 96%   Weight:      Height:        Constitutional: NAD, calm, comfortable Eyes: PERRL, lids and conjunctivae normal ENMT: Mucous membranes are moist. Posterior pharynx clear of any exudate or lesions.Normal dentition.  Neck: normal,  supple, no masses, no thyromegaly Respiratory: clear to auscultation bilaterally, no wheezing, no crackles. Normal respiratory effort. No accessory muscle use.  Cardiovascular: Regular rhythm, tachycardic.  3+ pitting bilateral lower extremity edema tapering at the knees. 2+ pedal pulses. Abdomen: no tenderness, no masses palpated. No hepatosplenomegaly. Bowel sounds positive.  Musculoskeletal: no clubbing / cyanosis. No joint deformity upper and lower extremities. Good ROM, no contractures. Normal muscle tone.  Skin: no rashes, lesions, ulcers on exposed surfaces.  Neurologic: CN 2-12 grossly intact.  Moves all extremities without issue. Psychiatric: Normal judgment and insight. Alert and oriented x 3. Normal mood.   Labs on Admission: I have personally reviewed following labs and  imaging studies  CBC: Recent Labs  Lab 08/05/24 1850 08/05/24 1958  WBC  --  2.3*  NEUTROABS  --  PENDING  HGB 10.9* 7.3*  HCT 32.0* 23.1*  MCV  --  75.5*  PLT  --  137*   Basic Metabolic Panel: Recent Labs  Lab 08/05/24 1850 08/05/24 1958  NA 135 136  K 4.9 3.9  CL 101 108  CO2  --  21*  GLUCOSE 73 65*  BUN 48* 35*  CREATININE 1.30* 0.98  CALCIUM   --  7.5*   GFR: Estimated Creatinine Clearance: 58.4 mL/min (by C-G formula based on SCr of 0.98 mg/dL). Liver Function Tests: Recent Labs  Lab 08/05/24 1958  AST 10*  ALT <5  ALKPHOS 63  BILITOT 0.7  PROT 6.2*  ALBUMIN  2.8*   Recent Labs  Lab 08/05/24 1958  AMMONIA 14   CBG: Recent Labs  Lab 08/05/24 1819  GLUCAP 84   Urine analysis:    Component Value Date/Time   COLORURINE YELLOW 08/05/2024 1803   APPEARANCEUR HAZY (A) 08/05/2024 1803   LABSPEC 1.017 08/05/2024 1803   PHURINE 5.0 08/05/2024 1803   GLUCOSEU >=500 (A) 08/05/2024 1803   HGBUR LARGE (A) 08/05/2024 1803   BILIRUBINUR NEGATIVE 08/05/2024 1803   KETONESUR NEGATIVE 08/05/2024 1803   PROTEINUR NEGATIVE 08/05/2024 1803   NITRITE NEGATIVE 08/05/2024 1803    LEUKOCYTESUR NEGATIVE 08/05/2024 1803   Sepsis Labs: Recent Results (from the past 240 hours)  Resp panel by RT-PCR (RSV, Flu A&B, Covid) Anterior Nasal Swab     Status: None   Collection Time: 08/05/24  6:03 PM   Specimen: Anterior Nasal Swab  Result Value Ref Range Status   SARS Coronavirus 2 by RT PCR NEGATIVE NEGATIVE Final    Comment: (NOTE) SARS-CoV-2 target nucleic acids are NOT DETECTED.  The SARS-CoV-2 RNA is generally detectable in upper respiratory specimens during the acute phase of infection. The lowest concentration of SARS-CoV-2 viral copies this assay can detect is 138 copies/mL. A negative result does not preclude SARS-Cov-2 infection and should not be used as the sole basis for treatment or other patient management decisions. A negative result may occur with  improper specimen collection/handling, submission of specimen other than nasopharyngeal swab, presence of viral mutation(s) within the areas targeted by this assay, and inadequate number of viral copies(<138 copies/mL). A negative result must be combined with clinical observations, patient history, and epidemiological information. The expected result is Negative.  Fact Sheet for Patients:  bloggercourse.com  Fact Sheet for Healthcare Providers:  seriousbroker.it  This test is no t yet approved or cleared by the [place]  FDA and  has been authorized for detection and/or diagnosis of SARS-CoV-2 by FDA under an Emergency Use Authorization (EUA). This EUA will remain  in effect (meaning this test can be used) for the duration of the COVID-19 declaration under Section 564(b)(1) of the Act, 21 U.S.C.section 360bbb-3(b)(1), unless the authorization is terminated  or revoked sooner.       Influenza A by PCR NEGATIVE NEGATIVE Final   Influenza B by PCR NEGATIVE NEGATIVE Final    Comment: (NOTE) The Xpert Xpress SARS-CoV-2/FLU/RSV plus assay is intended as  an aid in the diagnosis of influenza from Nasopharyngeal swab specimens and should not be used as a sole basis for treatment. Nasal washings and aspirates are unacceptable for Xpert Xpress SARS-CoV-2/FLU/RSV testing.  Fact Sheet for Patients: bloggercourse.com  Fact Sheet for Healthcare Providers: seriousbroker.it  This test is not yet approved or cleared  by the [place]  FDA and has been authorized for detection and/or diagnosis of SARS-CoV-2 by FDA under an Emergency Use Authorization (EUA). This EUA will remain in effect (meaning this test can be used) for the duration of the COVID-19 declaration under Section 564(b)(1) of the Act, 21 U.S.C. section 360bbb-3(b)(1), unless the authorization is terminated or revoked.     Resp Syncytial Virus by PCR NEGATIVE NEGATIVE Final    Comment: (NOTE) Fact Sheet for Patients: bloggercourse.com  Fact Sheet for Healthcare Providers: seriousbroker.it  This test is not yet approved or cleared by the [place]  FDA and has been authorized for detection and/or diagnosis of SARS-CoV-2 by FDA under an Emergency Use Authorization (EUA). This EUA will remain in effect (meaning this test can be used) for the duration of the COVID-19 declaration under Section 564(b)(1) of the Act, 21 U.S.C. section 360bbb-3(b)(1), unless the authorization is terminated or revoked.  Performed at [facility], [address] [place], [place] [address]      Radiological Exams on Admission: CT Angio Chest PE W and/or Wo Contrast Result Date: 08/05/2024 EXAM: CTA CHEST PE WITH AND WITHOUT CONTRAST CT ABDOMEN AND PELVIS WITH AND WITHOUT CONTRAST 08/05/2024 08:30:00 PM TECHNIQUE: CTA of the chest was performed after the administration of 100 mL of iohexol  (OMNIPAQUE ) 350 MG/ML injection. Multiplanar reformatted images are provided for review. MIP  images are provided for review. CT of the abdomen and pelvis was performed with and without the administration of intravenous contrast. Automated exposure control, iterative reconstruction, and/or weight based adjustment of the mA/kV was utilized to reduce the radiation dose to as low as reasonably achievable. COMPARISON: Findings are compared to prior examination of 04/17/2020. CLINICAL HISTORY: Pulmonary embolism (PE) suspected, low to intermediate prob, neg D-dimer. Sepsis, left leg swelling, cough, fever, malaise. FINDINGS: CHEST: PULMONARY ARTERIES: Pulmonary arteries are adequately opacified for evaluation. No intraluminal filling defect to suggest pulmonary embolism. Main pulmonary artery is normal in caliber. MEDIASTINUM: No mediastinal lymphadenopathy. Mild to multivessel coronary artery calcification. Global cardiac size within normal limits. No pericardial effusion. Mild atherosclerotic calcification within the thoracic aorta. No aortic aneurysm. LUNGS AND PLEURA: Bibasilar cylindrical bronchiectasis is present, progressive since prior examination. No superimposed focal pulmonary infiltrate. The esophagus is patulous which may reflect changes of esophageal dysmotility or gastroesophageal reflux. There is a small focus of peripheral consolidation within the posterior segment of the right upper lobe in keeping with acute lobar pneumonia in the appropriate clinical setting. Follow up evaluation in 6 - 12 weeks, however, is recommended to document resolution as alternative considerations in this immunocompromised patient including atypical infection, post-transplant lymphoproliferative disorder, or bronchogenic carcinoma could appear similarly. Bibasilar atelectasis. Asymmetric bronchial wall thickening noted in keeping with airway inflammation, more severe within the left perihilar region. There is confluent right hilar and subcarinal adenopathy, nonspecific, possible reactant in nature. This can be  reassessed on follow up imaging to ensure resolution. No pleural effusion or pneumothorax. SOFT TISSUES AND BONES: Osseous structures are age appropriate. No acute bone abnormality. No lytic or blastic bone lesion. No acute soft tissue abnormality. ABDOMEN AND PELVIS: LIVER: Mild hepatic steatosis. GALLBLADDER AND BILE DUCTS: Status post cholecystectomy. No biliary ductal dilatation. SPLEEN: As noted above, there has been interval splenic embolization with intense embolization pack within the expected distal splenic artery. Previously noted splenomegaly has resolved for dyspnea now measuring 13 cm in greatest dimension. Small chronic subcapsular hematoma is unchanged measuring 1.0 x 5.2 cm (35/2). PANCREAS: The pancreas is atrophic,  but is otherwise unremarkable. Evaluation of the distal pancreas is limited by extensive streak artifact from embolization coils within the expected distal splenic artery. ADRENAL GLANDS: Right adrenal gland is unremarkable. Left adrenal gland is obscured by streak artifact. KIDNEYS, URETERS AND BLADDER: The kidneys are markedly atrophic bilaterally and demonstrate progressive parenchymal atrophy since prior examination. Transplanted kidney is seen within the right iliac fossa which is normal in size and cortical thickness and demonstrates normal cortical enhancement. There is mild right hydronephrosis and hydroureter. No intrarenal or ureteral calculi are identified. The bladder is moderately distended and the bladder wall is trabeculated in keeping with changes of chronic bladder outlet obstruction. There is marked prostatic hypertrophy and, together, the findings suggest chronic bladder outlet obstruction secondary to central prostatic hypertrophy. This may account for right-sided hydronephrosis, however, an anastomotic stricture or vesicoureteral reflux is not excluded by this examination. If indicated, this could be further evaluated with lasix  scintigraphy or retrograde urography.  No perinephric or periureteral stranding. GI AND BOWEL: Moderate colonic stool burden without evidence of obstruction. Appendix normal. The stomach, small bowel, and large bowel are otherwise unremarkable. REPRODUCTIVE: There is marked prostatic hypertrophy and, together, the findings suggest chronic bladder outlet obstruction secondary to central prostatic hypertrophy. PERITONEUM AND RETROPERITONEUM: No ascites or free air. LYMPH NODES: No lymphadenopathy. BONES AND SOFT TISSUES: Mild aortoiliac atherosclerotic calcification. No aortic aneurysm. There is marked mass effect upon the left common iliac vein by the left internal iliac artery (53/2) which may account for the patient's reported asymmetric left leg edema. Disc vessel remains patent, however. Osseous structures are age appropriate. No acute bone abnormality. No lytic or blastic bone lesion. No focal soft tissue abnormality. RAF score: Aortic atherosclerosis (icd10-i70.0) IMPRESSION: 1. No pulmonary embolism. Small focus of peripheral consolidation within the posterior segment of the right upper lobe, suspicious for acute lobar pneumonia; follow-up chest CT in 6-12 weeks recommended to document resolution given immunocompromised state and alternative considerations. Progressive bibasilar cylindrical bronchiectasis since prior examination, with asymmetric bronchial wall thickening, more severe in the left perihilar region. Confluent right hilar and subcarinal adenopathy, nonspecific; reassess on follow-up imaging to ensure resolution. Marked mass effect upon the left common iliac vein by the left internal iliac artery, possibly accounting for asymmetric left leg edema; left common iliac vein remains patent. Mild right hydronephrosis and hydroureter, likely related to chronic bladder outlet obstruction from marked prostatic hypertrophy; anastomotic stricture or vesicoureteral reflux is not excluded and may be further evaluated with Lasix  scintigraphy or  retrograde urography if indicated. Marked bilateral renal parenchymal atrophy and transplanted kidney in the right iliac fossa with normal size, cortical thickness, and enhancement. Mild hepatic steatosis. Pancreatic atrophy with limited evaluation of the distal pancreas due to streak artifact from prior splenic artery embolization. Interval splenic embolization with residual embolic material in the expected distal splenic artery and residual small chronic subcapsular splenic hematoma measuring approximately 1.0 x 5.2 cm; prior splenomegaly has resolved. Mild aortoiliac atherosclerotic calcification. Raf score: Aortic atherosclerosis (icd10-i70.0). Electronically signed by: Dorethia Molt MD 08/05/2024 09:17 PM EST RP Workstation: [id number]   CT ABDOMEN PELVIS W CONTRAST Result Date: 08/05/2024 EXAM: CTA CHEST PE WITH AND WITHOUT CONTRAST CT ABDOMEN AND PELVIS WITH AND WITHOUT CONTRAST 08/05/2024 08:30:00 PM TECHNIQUE: CTA of the chest was performed after the administration of 100 mL of iohexol  (OMNIPAQUE ) 350 MG/ML injection. Multiplanar reformatted images are provided for review. MIP images are provided for review. CT of the abdomen and pelvis was performed with and without the  administration of intravenous contrast. Automated exposure control, iterative reconstruction, and/or weight based adjustment of the mA/kV was utilized to reduce the radiation dose to as low as reasonably achievable. COMPARISON: Findings are compared to prior examination of 04/17/2020. CLINICAL HISTORY: Pulmonary embolism (PE) suspected, low to intermediate prob, neg D-dimer. Sepsis, left leg swelling, cough, fever, malaise. FINDINGS: CHEST: PULMONARY ARTERIES: Pulmonary arteries are adequately opacified for evaluation. No intraluminal filling defect to suggest pulmonary embolism. Main pulmonary artery is normal in caliber. MEDIASTINUM: No mediastinal lymphadenopathy. Mild to multivessel coronary artery calcification. Global cardiac size  within normal limits. No pericardial effusion. Mild atherosclerotic calcification within the thoracic aorta. No aortic aneurysm. LUNGS AND PLEURA: Bibasilar cylindrical bronchiectasis is present, progressive since prior examination. No superimposed focal pulmonary infiltrate. The esophagus is patulous which may reflect changes of esophageal dysmotility or gastroesophageal reflux. There is a small focus of peripheral consolidation within the posterior segment of the right upper lobe in keeping with acute lobar pneumonia in the appropriate clinical setting. Follow up evaluation in 6 - 12 weeks, however, is recommended to document resolution as alternative considerations in this immunocompromised patient including atypical infection, post-transplant lymphoproliferative disorder, or bronchogenic carcinoma could appear similarly. Bibasilar atelectasis. Asymmetric bronchial wall thickening noted in keeping with airway inflammation, more severe within the left perihilar region. There is confluent right hilar and subcarinal adenopathy, nonspecific, possible reactant in nature. This can be reassessed on follow up imaging to ensure resolution. No pleural effusion or pneumothorax. SOFT TISSUES AND BONES: Osseous structures are age appropriate. No acute bone abnormality. No lytic or blastic bone lesion. No acute soft tissue abnormality. ABDOMEN AND PELVIS: LIVER: Mild hepatic steatosis. GALLBLADDER AND BILE DUCTS: Status post cholecystectomy. No biliary ductal dilatation. SPLEEN: As noted above, there has been interval splenic embolization with intense embolization pack within the expected distal splenic artery. Previously noted splenomegaly has resolved for dyspnea now measuring 13 cm in greatest dimension. Small chronic subcapsular hematoma is unchanged measuring 1.0 x 5.2 cm (35/2). PANCREAS: The pancreas is atrophic, but is otherwise unremarkable. Evaluation of the distal pancreas is limited by extensive streak artifact  from embolization coils within the expected distal splenic artery. ADRENAL GLANDS: Right adrenal gland is unremarkable. Left adrenal gland is obscured by streak artifact. KIDNEYS, URETERS AND BLADDER: The kidneys are markedly atrophic bilaterally and demonstrate progressive parenchymal atrophy since prior examination. Transplanted kidney is seen within the right iliac fossa which is normal in size and cortical thickness and demonstrates normal cortical enhancement. There is mild right hydronephrosis and hydroureter. No intrarenal or ureteral calculi are identified. The bladder is moderately distended and the bladder wall is trabeculated in keeping with changes of chronic bladder outlet obstruction. There is marked prostatic hypertrophy and, together, the findings suggest chronic bladder outlet obstruction secondary to central prostatic hypertrophy. This may account for right-sided hydronephrosis, however, an anastomotic stricture or vesicoureteral reflux is not excluded by this examination. If indicated, this could be further evaluated with lasix  scintigraphy or retrograde urography. No perinephric or periureteral stranding. GI AND BOWEL: Moderate colonic stool burden without evidence of obstruction. Appendix normal. The stomach, small bowel, and large bowel are otherwise unremarkable. REPRODUCTIVE: There is marked prostatic hypertrophy and, together, the findings suggest chronic bladder outlet obstruction secondary to central prostatic hypertrophy. PERITONEUM AND RETROPERITONEUM: No ascites or free air. LYMPH NODES: No lymphadenopathy. BONES AND SOFT TISSUES: Mild aortoiliac atherosclerotic calcification. No aortic aneurysm. There is marked mass effect upon the left common iliac vein by the left internal iliac artery (53/2)  which may account for the patient's reported asymmetric left leg edema. Disc vessel remains patent, however. Osseous structures are age appropriate. No acute bone abnormality. No lytic or  blastic bone lesion. No focal soft tissue abnormality. RAF score: Aortic atherosclerosis (icd10-i70.0) IMPRESSION: 1. No pulmonary embolism. Small focus of peripheral consolidation within the posterior segment of the right upper lobe, suspicious for acute lobar pneumonia; follow-up chest CT in 6-12 weeks recommended to document resolution given immunocompromised state and alternative considerations. Progressive bibasilar cylindrical bronchiectasis since prior examination, with asymmetric bronchial wall thickening, more severe in the left perihilar region. Confluent right hilar and subcarinal adenopathy, nonspecific; reassess on follow-up imaging to ensure resolution. Marked mass effect upon the left common iliac vein by the left internal iliac artery, possibly accounting for asymmetric left leg edema; left common iliac vein remains patent. Mild right hydronephrosis and hydroureter, likely related to chronic bladder outlet obstruction from marked prostatic hypertrophy; anastomotic stricture or vesicoureteral reflux is not excluded and may be further evaluated with Lasix  scintigraphy or retrograde urography if indicated. Marked bilateral renal parenchymal atrophy and transplanted kidney in the right iliac fossa with normal size, cortical thickness, and enhancement. Mild hepatic steatosis. Pancreatic atrophy with limited evaluation of the distal pancreas due to streak artifact from prior splenic artery embolization. Interval splenic embolization with residual embolic material in the expected distal splenic artery and residual small chronic subcapsular splenic hematoma measuring approximately 1.0 x 5.2 cm; prior splenomegaly has resolved. Mild aortoiliac atherosclerotic calcification. Raf score: Aortic atherosclerosis (icd10-i70.0). Electronically signed by: Dorethia Molt MD 08/05/2024 09:17 PM EST RP Workstation: [id number]   DG Chest Port 1 View Result Date: 08/05/2024 EXAM: 1 VIEW(S) XRAY OF THE CHEST 08/05/2024  06:40:00 PM COMPARISON: None available. CLINICAL HISTORY: SOB / cough / fever FINDINGS: LUNGS AND PLEURA: Low lung volumes. No focal pulmonary opacity. No pleural effusion. No pneumothorax. HEART AND MEDIASTINUM: Aortic atherosclerosis. No acute abnormality of the cardiac and mediastinal silhouettes. BONES AND SOFT TISSUES: Vascular coils in left upper quadrant. No acute osseous abnormality. IMPRESSION: 1. No acute cardiopulmonary abnormality. 2. Vascular coils in the left upper quadrant. Electronically signed by: Greig Pique MD 08/05/2024 06:50 PM EST RP Workstation: [id number]    EKG: Independently reviewed.   Assessment/Plan Principal Problem:   CAP (community acquired pneumonia)  He is no longer on hemodialysis, has not been receiving IV antibiotics or being committed to a healthcare facility recently.  Transition to azithromycin  and Rocephin  2 tomorrow for 4 total doses  Currently on room air   Active Problems:   Pancytopenia (HCC)  Monitor, some dilution noted    S/P kidney transplant/  S/P liver transplant (HCC)  Continue CellCept  500 mg in the morning, 250 mg in the evening, prednisone  5 mg daily, Gengraf  125 mg twice daily    Sepsis (HCC)  WBC, heart rate, respiratory rate  Blood cultures were unable to be drawn  LR 100 mL/hour    Type 2 diabetes mellitus with hyperglycemia (HCC)  Basal bolus insulin , slim correction  DVT prophylaxis: Lovenox/HPN not give as he does not wish to take pork products; SCDs placed Code Status: Full-discussed with son at bedside Family Communication: Son bedside Disposition Plan: Home Consults called: None Admission status: Observation  Severity of Illness: The appropriate patient status for this patient is OBSERVATION. Observation status is judged to be reasonable and necessary in order to provide the required intensity of service to ensure the patient's safety. The patient's presenting symptoms, physical exam findings, and initial  radiographic and  laboratory data in the context of their medical condition is felt to place them at decreased risk for further clinical deterioration. Furthermore, it is anticipated that the patient will be medically stable for discharge from the hospital within 2 midnights of admission.   Mabel Deward Pry, DO [facility] [email or web address] 08/05/2024, 10:23 PM      [1]  Allergies Allergen Reactions   Heparin  Other (See Comments)    Patient is Muslim and is not permitted Pork derative due to religious belief)   Porcine (Pork) Protein-Containing Drug Products Other (See Comments)    Patient does not eat pork due to religious beliefs   "

## 2024-08-06 ENCOUNTER — Observation Stay (HOSPITAL_COMMUNITY)

## 2024-08-06 DIAGNOSIS — Z940 Kidney transplant status: Secondary | ICD-10-CM | POA: Diagnosis not present

## 2024-08-06 DIAGNOSIS — Z794 Long term (current) use of insulin: Secondary | ICD-10-CM | POA: Diagnosis not present

## 2024-08-06 DIAGNOSIS — D61818 Other pancytopenia: Secondary | ICD-10-CM

## 2024-08-06 DIAGNOSIS — E1165 Type 2 diabetes mellitus with hyperglycemia: Secondary | ICD-10-CM | POA: Diagnosis not present

## 2024-08-06 DIAGNOSIS — Z944 Liver transplant status: Secondary | ICD-10-CM | POA: Diagnosis not present

## 2024-08-06 DIAGNOSIS — R609 Edema, unspecified: Secondary | ICD-10-CM | POA: Diagnosis not present

## 2024-08-06 DIAGNOSIS — J189 Pneumonia, unspecified organism: Secondary | ICD-10-CM | POA: Diagnosis not present

## 2024-08-06 DIAGNOSIS — J181 Lobar pneumonia, unspecified organism: Secondary | ICD-10-CM | POA: Diagnosis not present

## 2024-08-06 LAB — BASIC METABOLIC PANEL WITH GFR
Anion gap: 8 (ref 5–15)
BUN: 32 mg/dL — ABNORMAL HIGH (ref 8–23)
CO2: 22 mmol/L (ref 22–32)
Calcium: 8.3 mg/dL — ABNORMAL LOW (ref 8.9–10.3)
Chloride: 100 mmol/L (ref 98–111)
Creatinine, Ser: 1.11 mg/dL (ref 0.61–1.24)
GFR, Estimated: >60 mL/min
Glucose, Bld: 194 mg/dL — ABNORMAL HIGH (ref 70–99)
Potassium: 4.9 mmol/L (ref 3.5–5.1)
Sodium: 130 mmol/L — ABNORMAL LOW (ref 135–145)

## 2024-08-06 LAB — HEMOGLOBIN A1C
Hgb A1c MFr Bld: 9.5 % — ABNORMAL HIGH (ref 4.8–5.6)
Mean Plasma Glucose: 225.95 mg/dL

## 2024-08-06 LAB — GLUCOSE, CAPILLARY
Glucose-Capillary: 167 mg/dL — ABNORMAL HIGH (ref 70–99)
Glucose-Capillary: 171 mg/dL — ABNORMAL HIGH (ref 70–99)

## 2024-08-06 LAB — CBC
HCT: 25.7 % — ABNORMAL LOW (ref 39.0–52.0)
Hemoglobin: 8.1 g/dL — ABNORMAL LOW (ref 13.0–17.0)
MCH: 23.5 pg — ABNORMAL LOW (ref 26.0–34.0)
MCHC: 31.5 g/dL (ref 30.0–36.0)
MCV: 74.7 fL — ABNORMAL LOW (ref 80.0–100.0)
Platelets: 126 K/uL — ABNORMAL LOW (ref 150–400)
RBC: 3.44 MIL/uL — ABNORMAL LOW (ref 4.22–5.81)
RDW: 17.2 % — ABNORMAL HIGH (ref 11.5–15.5)
WBC: 2 K/uL — ABNORMAL LOW (ref 4.0–10.5)
nRBC: 0 % (ref 0.0–0.2)

## 2024-08-06 MED ORDER — ORAL CARE MOUTH RINSE: 15.0000 mL | OROMUCOSAL | Status: DC | PRN

## 2024-08-06 MED ORDER — TAMSULOSIN HCL 0.4 MG PO CAPS: 0.4000 mg | ORAL_CAPSULE | Freq: Every day | ORAL | 2 refills | Status: AC

## 2024-08-06 MED ORDER — AMOXICILLIN-POT CLAVULANATE 875-125 MG PO TABS: 1.0000 | ORAL_TABLET | Freq: Two times a day (BID) | ORAL | 0 refills | Status: AC

## 2024-08-06 NOTE — Plan of Care (Signed)
  Problem: Education: Goal: Ability to describe self-care measures that may prevent or decrease complications (Diabetes Survival Skills Education) will improve Outcome: Adequate for Discharge Goal: Individualized Educational Video(s) Outcome: Adequate for Discharge   Problem: Coping: Goal: Ability to adjust to condition or change in health will improve Outcome: Adequate for Discharge   Problem: Fluid Volume: Goal: Ability to maintain a balanced intake and output will improve Outcome: Adequate for Discharge   Problem: Health Behavior/Discharge Planning: Goal: Ability to identify and utilize available resources and services will improve Outcome: Adequate for Discharge Goal: Ability to manage health-related needs will improve Outcome: Adequate for Discharge   Problem: Metabolic: Goal: Ability to maintain appropriate glucose levels will improve Outcome: Adequate for Discharge   Problem: Nutritional: Goal: Maintenance of adequate nutrition will improve Outcome: Adequate for Discharge Goal: Progress toward achieving an optimal weight will improve Outcome: Adequate for Discharge   Problem: Skin Integrity: Goal: Risk for impaired skin integrity will decrease Outcome: Adequate for Discharge   Problem: Tissue Perfusion: Goal: Adequacy of tissue perfusion will improve Outcome: Adequate for Discharge   Problem: Education: Goal: Knowledge of General Education information will improve Description: Including pain rating scale, medication(s)/side effects and non-pharmacologic comfort measures Outcome: Adequate for Discharge   Problem: Health Behavior/Discharge Planning: Goal: Ability to manage health-related needs will improve Outcome: Adequate for Discharge   Problem: Clinical Measurements: Goal: Ability to maintain clinical measurements within normal limits will improve Outcome: Adequate for Discharge Goal: Will remain free from infection Outcome: Adequate for Discharge Goal:  Diagnostic test results will improve Outcome: Adequate for Discharge Goal: Respiratory complications will improve Outcome: Adequate for Discharge Goal: Cardiovascular complication will be avoided Outcome: Adequate for Discharge   Problem: Activity: Goal: Risk for activity intolerance will decrease Outcome: Adequate for Discharge   Problem: Nutrition: Goal: Adequate nutrition will be maintained Outcome: Adequate for Discharge   Problem: Coping: Goal: Level of anxiety will decrease Outcome: Adequate for Discharge   Problem: Elimination: Goal: Will not experience complications related to bowel motility Outcome: Adequate for Discharge Goal: Will not experience complications related to urinary retention Outcome: Adequate for Discharge   Problem: Pain Managment: Goal: General experience of comfort will improve and/or be controlled Outcome: Adequate for Discharge   Problem: Safety: Goal: Ability to remain free from injury will improve Outcome: Adequate for Discharge   Problem: Skin Integrity: Goal: Risk for impaired skin integrity will decrease Outcome: Adequate for Discharge   Problem: Activity: Goal: Ability to tolerate increased activity will improve Outcome: Adequate for Discharge   Problem: Clinical Measurements: Goal: Ability to maintain a body temperature in the normal range will improve Outcome: Adequate for Discharge   Problem: Respiratory: Goal: Ability to maintain adequate ventilation will improve Outcome: Adequate for Discharge Goal: Ability to maintain a clear airway will improve Outcome: Adequate for Discharge

## 2024-08-06 NOTE — TOC Initial Note (Signed)
 Transition of Care [facility]) - Initial/Assessment Note    Patient Details  Name: William [name] MRN: [id number] Date of Birth: January 27, 1951  [facility] CM/SW Contact:    Doneta Glenys DASEN, RN Phone Number: 08/06/2024, 10:29 AM  Clinical Narrative:                 MOON completed with patients [facility] Son, [phone]. Patient son translated. No IP CM needs identified.  Expected Discharge Plan: Home/Self Care Barriers to Discharge: No Barriers Identified   Patient Goals and CMS Choice Patient states their goals for this hospitalization and ongoing recovery are:: Home CMS Medicare.gov Compare Post Acute Care list provided to:: Patient Represenative (must comment) Pecola Rhody Muneeb  Son, Emergency Contact  [phone]) Choice offered to / list presented to : Adult Children [place] ownership interest in [facility].provided to:: Adult Children    Expected Discharge Plan and Services In-house Referral: NA Discharge Planning Services: CM Consult   Living arrangements for the past 2 months: Single Family Home                 DME Arranged: N/A DME Agency: NA       HH Arranged: NA HH Agency: NA        Prior Living Arrangements/Services Living arrangements for the past 2 months: Single Family Home Lives with:: Adult Children Patient language and need for interpreter reviewed:: Yes Do you feel safe going back to the place where you live?:  (unable to assess)      Need for Family Participation in Patient Care: Yes (Comment) Care giver support system in place?: Yes (comment) Current home services: DME (walker) Criminal Activity/Legal Involvement Pertinent to Current Situation/Hospitalization: No - Comment as needed  Activities of Daily Living   ADL Screening (condition at time of admission) Independently performs ADLs?: Yes (appropriate for developmental age) Is the patient deaf or have difficulty hearing?: No Does the patient  have difficulty seeing, even when wearing glasses/contacts?: No Does the patient have difficulty concentrating, remembering, or making decisions?: No  Permission Sought/Granted Permission sought to share information with : Case Manager Permission granted to share information with : Yes, Verbal Permission Granted  Share Information with NAME: Diamante, Rubin  Son, Emergency Contact  [phone]           Emotional Assessment Appearance:: Appears stated age Attitude/Demeanor/Rapport: Unable to Assess Affect (typically observed): Unable to Assess Orientation: :  (unable to assess)      Admission diagnosis:  Lobar pneumonia [J18.1] Pneumonia due to infectious organism, unspecified laterality, unspecified part of lung [J18.9] Patient Active Problem List   Diagnosis Date Noted   S/P kidney transplant 08/05/2024   S/P liver transplant (HCC) 08/05/2024   CAP (community acquired pneumonia) 08/05/2024   Sepsis (HCC) 08/05/2024   Type 2 diabetes mellitus with hyperglycemia (HCC) 08/05/2024   Lobar pneumonia 08/05/2024   History of kidney transplant 03/26/2023   Edentulous 03/26/2023   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   Elevated PSA 12/14/2020   Repeated falls 11/13/2020   Coagulation defect, unspecified 11/06/2020   Other microscopic hematuria 11/03/2020   Traumatic amputation of third toe of left foot 10/15/2020   Thrombotic microangiopathy, 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   Physical deconditioning 04/27/2020   History of liver transplant (HCC) 04/27/2020   Presbycusis of both ears 09/24/2019   Esophageal varices (HCC) 09/23/2018   Mild protein-calorie malnutrition 02/04/2018   Anemia in chronic kidney disease 01/22/2018   Iron  deficiency anemia, unspecified 01/22/2018   Secondary hyperparathyroidism  of renal origin 01/22/2018   CKD (chronic kidney disease) stage 4, GFR 15-29 ml/min (HCC) 12/19/2017   Goodpasture's syndrome (HCC) with associated hemoptysis 11/20/2017   Edema of  both feet 11/20/2017   Pancytopenia (HCC) 11/06/2017   Type II diabetes mellitus with renal manifestations (HCC) 08/25/2015   PCP:  Purcell Emil Schanz, MD Pharmacy:   [facility] [address] [place] - [address] [place] [address] Phone: [phone] Fax: [phone]     Social Drivers of Health (SDOH) Social History: SDOH Screenings   Food Insecurity: No Food Insecurity (08/05/2024)  Housing: Low Risk (08/05/2024)  Transportation Needs: No Transportation Needs (08/05/2024)  Utilities: Not At Risk (08/05/2024)  Depression (PHQ2-9): Low Risk (06/02/2023)  Physical Activity: Inactive (09/18/2021)   [facility] from [facility]  Social Connections: Unknown (08/05/2024)  Tobacco Use: Low Risk (08/05/2024)   SDOH Interventions:     Readmission Risk Interventions     No data to display

## 2024-08-06 NOTE — Care Management Obs Status (Signed)
 MEDICARE OBSERVATION STATUS NOTIFICATION   Patient Details  Name: DREWEY BEGUE MRN: [id number] Date of Birth: 1950-08-17   Medicare Observation Status Notification Given:  Yes    Doneta Glenys DASEN, RN 08/06/2024, 10:10 AM

## 2024-08-06 NOTE — Plan of Care (Signed)

## 2024-08-06 NOTE — Discharge Summary (Signed)
 " Physician Discharge Summary   Patient: William [name] MRN: [id number] DOB: [birth year]  Admit date:     08/05/2024  Discharge date: 08/06/2024  Discharge Physician: Concepcion Riser   PCP: Purcell Emil Schanz, MD   Recommendations at discharge:   PCP follow up in 1 week. [facility] transplant center as scheduled.  Discharge Diagnoses: Principal Problem:   CAP (community acquired pneumonia) Active Problems:   Pancytopenia (HCC)   S/P kidney transplant   S/P liver transplant (HCC)   Sepsis (HCC)   Type 2 diabetes mellitus with hyperglycemia (HCC)   Lobar pneumonia  Resolved Problems:   Other pancytopenia [facility])  Hospital Course: William [name] is a 74 y.o. male with medical history significant of kidney and liver transplant (08/10/2021 at [facility]), diabetes on insulin .  He is no longer on dialysis.  His son is at the bedside and helps interpret (Arabic). He has had weakness and malaise over the past couple days.  He is having a cough and worsening shortness of breath as well.  He returned from [place] yesterday.  He has an associated runny nose, nausea, vomiting, and congestion.  The patient has also been dealing with recurrent swelling in his lower extremities over the past 4 months.  He has also been having intermittent pain throughout this time as well.  This started prior to his travel to [place].  No sinus pain, ear pain/drainage, fevers, wheezing, diarrhea, known sick contacts, sore throat, itchy/watery eyes, myalgias.  Reports compliance with immunosuppressive therapy.  He has overall been eating and drinking normally with the exception of today.  Patient had CT chest, abdomen, pelvis- no PE, small peripheral right upper lobe consolidation, progressive bibasal cylindrical bronchiectasis, hilar and subcarinal adenopathy. Marked mass effect on left common iliac vein by the left internal iliac artery, possibly accounting for asymmetric left leg edema. Right hydronephrosis and hydroureter.  Above  findings I discussed with son, patient does not want to stay and wished to follow outpatient with his PCP and transplant center. Discharged on Augmentin  therapy for 5 days.  Assessment and Plan: CAP (community acquired pneumonia) Presented with sepsis, got IV fluids, blood cultures.  Tachycardia, tachypnea improved. WBC stable. Lactic acid normal. Patient got Rocephin  and azithromycin . He wishes to go home, oral augmentin  for 5 days prescribed. He understands to follow PCP upon discharge.  Right leg swelling- Since many weeks. DVT ruled out. Discussed about findings of CT abdomen pelvis. I advised to follow PCP, [facility] transplant follow up as outpatient.  Pancytopenia (HCC) Seems chronic, outpatient follow up suggested.   S/P kidney transplant/  S/P liver transplant (HCC) Continue CellCept  500 mg in the morning, 250 mg in the evening, prednisone  5 mg daily, Gengraf  125 mg twice daily. Advised him to follow transplant center. Patient and son understand and agree.   Type 2 diabetes mellitus with hyperglycemia (HCC) Home dose lantis regimen resumed.       Consultants: none Procedures performed: none  Disposition: Home Diet recommendation:  Discharge Diet Orders (From admission, onward)     Start     Ordered   08/06/24 0000  Diet - low sodium heart healthy        08/06/24 1253   08/06/24 0000  Diet Carb Modified        08/06/24 1253           Cardiac and Carb modified diet DISCHARGE MEDICATION: Allergies as of 08/06/2024       Reactions   Heparin  Other (See Comments)  Patient is Muslim and is not permitted Pork derative due to religious belief)   Porcine (pork) Protein-containing Drug Products Other (See Comments)   Patient does not eat pork due to religious beliefs        Medication List     TAKE these medications    acetaminophen  650 MG CR tablet Commonly known as: TYLENOL  Take 650 mg by mouth every 8 (eight) hours as needed for pain.    amoxicillin -clavulanate 875-125 MG tablet Commonly known as: AUGMENTIN  Take 1 tablet by mouth 2 (two) times daily for 5 days.   aspirin  EC 81 MG tablet Take 81 mg by mouth daily. Swallow whole.   CALCIUM -MAGNESIUM PO Take 1 tablet by mouth in the morning and at bedtime.   ciprofloxacin -dexamethasone  OTIC suspension Commonly known as: CIPRODEX  Place 4 drops into the right ear 2 (two) times daily.   Gengraf  100 MG capsule Generic drug: cycloSPORINE  modified Take 100 mg by mouth 2 (two) times daily. Take along with 25 mg BID   Gengraf  25 MG capsule Generic drug: cycloSPORINE  modified Take 25 mg by mouth 2 (two) times daily. Take along with 100 mg BID   Insulin  Pen Needle 31G X 5 MM Misc Commonly known as: Advocate Insulin  Pen Needles Use four times daily to inject insulin    insulin  regular 100 units/mL injection Commonly known as: NOVOLIN R Inject 6-8 Units into the skin See admin instructions. Inject 8 units subcutaneously with breakfast, and 6 units with lunch and supper Sliding scale   lactulose  10 GM/15ML solution Commonly known as: CHRONULAC  Take 45 mLs (30 g total) by mouth 3 (three) times daily.   Lantus  SoloStar 100 UNIT/ML Solostar Pen Generic drug: insulin  glargine Inject 100 Units into the skin daily. Inject 14 units into skin What changed:  how much to take when to take this additional instructions   magnesium oxide 400 MG tablet Commonly known as: MAG-OX Take 400 mg by mouth daily.   mupirocin  ointment 2 % Commonly known as: BACTROBAN  Apply 1 application topically daily.   mycophenolate  250 MG capsule Commonly known as: CELLCEPT  Take 250-500 mg by mouth 2 (two) times daily. Take 2 capsules (500 mg) in the morning and Take 1 capsule (250 mg) at bedtime   ondansetron  4 MG disintegrating tablet Commonly known as: Zofran  ODT Take 1 tablet (4 mg total) by mouth 2 (two) times daily as needed for nausea or vomiting.   ONE TOUCH LANCETS Misc Use to test  blood sugar three times daily   OneTouch Verio test strip Generic drug: glucose blood USE 1 STRIP TO CHECK GLUCOSE THREE TIMES DAILY   predniSONE  5 MG tablet Commonly known as: DELTASONE  Take 5 mg by mouth daily with breakfast.   tamsulosin  0.4 MG Caps capsule Commonly known as: FLOMAX  Take 1 capsule (0.4 mg total) by mouth daily after supper.   Tresiba  FlexTouch 100 UNIT/ML FlexTouch Pen Generic drug: insulin  degludec Inject 14 Units into the skin daily. What changed:  how much to take when to take this        Discharge Exam: Filed Weights   08/05/24 1727  Weight: 70.3 kg      08/06/2024   12:21 PM 08/06/2024    8:51 AM 08/06/2024    4:40 AM  Vitals with BMI  Systolic 162 150 867  Diastolic 68 76 70  Pulse 84 84 94   General - Elderly Asian ill looking male, no apparent distress HEENT - PERRLA, EOMI, atraumatic head, non tender sinuses. Lung -  Clear, basal rales, no rhonchi, wheezes. Heart - S1, S2 heard, no murmurs, rubs, right> left pedal edema. Abdomen - Soft, non tender, bowel sounds good Neuro - Alert, awake and oriented x 3, non focal exam. Skin - Warm and dry. Right leg swelling, non tender  Condition at discharge: stable  The results of significant diagnostics from this hospitalization (including imaging, microbiology, ancillary and laboratory) are listed below for reference.   Imaging Studies: VAS US  LOWER EXTREMITY VENOUS (DVT) (7a-5p) Result Date: 08/06/2024  Lower Venous DVT Study Patient Name:  CALIEB LICHTMAN Credit  Date of Exam:   08/06/2024 Medical Rec #: [id number]        Accession #:    [id number] Date of Birth: May 31, 1951         Patient Gender: M Patient Age:   43 years Exam Location:  [facility] Procedure:      VAS US  LOWER EXTREMITY VENOUS (DVT) Referring Phys: MAUDE MESSICK --------------------------------------------------------------------------------  Indications: Recurrent pain and swelling X 4 months (left > right), worsening over the last 2  months in [place]. Acute lobar pneumonia and sepsis. Other Indications: CTA: There is marked mass effect upon the left common iliac                    vein by the left internal iliac artery which may account for                    the patient's reported asymmetric left leg                    edema. Risk Factors: Kidney and liver transplant secondary to [place] Syndrome (08/10/2021 at [facility]), diabetes on insulin , no longer on dialysis. Recent extended travel to and from [place], returning 08/05/24. Limitations: Cultural clothing. Comparison Study: Prior negative right LEV done 11/23/17 Performing Technologist: Alberta Lis RVS  Examination Guidelines: A complete evaluation includes B-mode imaging, spectral Doppler, color Doppler, and power Doppler as needed of all accessible portions of each vessel. Bilateral testing is considered an integral part of a complete examination. Limited examinations for reoccurring indications may be performed as noted. The reflux portion of the exam is performed with the patient in reverse Trendelenburg.  +---------+---------------+---------+-----------+----------+--------------+ RIGHT    CompressibilityPhasicitySpontaneityPropertiesThrombus Aging +---------+---------------+---------+-----------+----------+--------------+ CFV      Full           Yes      Yes                                 +---------+---------------+---------+-----------+----------+--------------+ SFJ      Full                                                        +---------+---------------+---------+-----------+----------+--------------+ FV Prox  Full           Yes      Yes                                 +---------+---------------+---------+-----------+----------+--------------+ FV Mid   Full           Yes      Yes                                 +---------+---------------+---------+-----------+----------+--------------+  FV DistalFull           Yes      Yes                                  +---------+---------------+---------+-----------+----------+--------------+ PFV      Full                                                        +---------+---------------+---------+-----------+----------+--------------+ POP      Full           Yes      Yes                                 +---------+---------------+---------+-----------+----------+--------------+ PTV      Full                                                        +---------+---------------+---------+-----------+----------+--------------+ PERO     Full                                                        +---------+---------------+---------+-----------+----------+--------------+ Gastroc  Full                                                        +---------+---------------+---------+-----------+----------+--------------+ GSV      Full                                                        +---------+---------------+---------+-----------+----------+--------------+   +---------+---------------+---------+----------------+----------+--------------+ LEFT     CompressibilityPhasicitySpontaneity     PropertiesThrombus Aging +---------+---------------+---------+----------------+----------+--------------+ CFV      Full           Yes      No                                       +---------+---------------+---------+----------------+----------+--------------+ SFJ      Full                                                             +---------+---------------+---------+----------------+----------+--------------+ FV Prox  Full           Yes      No                                       +---------+---------------+---------+----------------+----------+--------------+  FV Mid   Full           Yes      No                                       +---------+---------------+---------+----------------+----------+--------------+ FV DistalFull           Yes      No                                        +---------+---------------+---------+----------------+----------+--------------+ PFV      Full           Yes      No                                       +---------+---------------+---------+----------------+----------+--------------+ POP      Full           Yes      Yes with distal                                                           compression                              +---------+---------------+---------+----------------+----------+--------------+ PTV      Full                                                             +---------+---------------+---------+----------------+----------+--------------+ PERO     Full                                                             +---------+---------------+---------+----------------+----------+--------------+ Gastroc  Full                                                             +---------+---------------+---------+----------------+----------+--------------+     Summary: RIGHT: - There is no evidence of deep vein thrombosis in the lower extremity.  - No cystic structure found in the popliteal fossa. - Ultrasound characteristics of enlarged lymph nodes are noted in the groin. Subcutaneous edema noted throughout calf  LEFT: - There is no evidence of deep vein thrombosis in the lower extremity.  - No cystic structure found in the popliteal fossa. Subcutaneous edema noted throughout calf.  *See table(s) above for measurements and observations.    Preliminary    CT Angio Chest PE W and/or Wo Contrast Result Date: 08/05/2024 EXAM:  CTA CHEST PE WITH AND WITHOUT CONTRAST CT ABDOMEN AND PELVIS WITH AND WITHOUT CONTRAST 08/05/2024 08:30:00 PM TECHNIQUE: CTA of the chest was performed after the administration of 100 mL of iohexol  (OMNIPAQUE ) 350 MG/ML injection. Multiplanar reformatted images are provided for review. MIP images are provided for review. CT of the abdomen and pelvis was performed with and  without the administration of intravenous contrast. Automated exposure control, iterative reconstruction, and/or weight based adjustment of the mA/kV was utilized to reduce the radiation dose to as low as reasonably achievable. COMPARISON: Findings are compared to prior examination of 04/17/2020. CLINICAL HISTORY: Pulmonary embolism (PE) suspected, low to intermediate prob, neg D-dimer. Sepsis, left leg swelling, cough, fever, malaise. FINDINGS: CHEST: PULMONARY ARTERIES: Pulmonary arteries are adequately opacified for evaluation. No intraluminal filling defect to suggest pulmonary embolism. Main pulmonary artery is normal in caliber. MEDIASTINUM: No mediastinal lymphadenopathy. Mild to multivessel coronary artery calcification. Global cardiac size within normal limits. No pericardial effusion. Mild atherosclerotic calcification within the thoracic aorta. No aortic aneurysm. LUNGS AND PLEURA: Bibasilar cylindrical bronchiectasis is present, progressive since prior examination. No superimposed focal pulmonary infiltrate. The esophagus is patulous which may reflect changes of esophageal dysmotility or gastroesophageal reflux. There is a small focus of peripheral consolidation within the posterior segment of the right upper lobe in keeping with acute lobar pneumonia in the appropriate clinical setting. Follow up evaluation in 6 - 12 weeks, however, is recommended to document resolution as alternative considerations in this immunocompromised patient including atypical infection, post-transplant lymphoproliferative disorder, or bronchogenic carcinoma could appear similarly. Bibasilar atelectasis. Asymmetric bronchial wall thickening noted in keeping with airway inflammation, more severe within the left perihilar region. There is confluent right hilar and subcarinal adenopathy, nonspecific, possible reactant in nature. This can be reassessed on follow up imaging to ensure resolution. No pleural effusion or pneumothorax.  SOFT TISSUES AND BONES: Osseous structures are age appropriate. No acute bone abnormality. No lytic or blastic bone lesion. No acute soft tissue abnormality. ABDOMEN AND PELVIS: LIVER: Mild hepatic steatosis. GALLBLADDER AND BILE DUCTS: Status post cholecystectomy. No biliary ductal dilatation. SPLEEN: As noted above, there has been interval splenic embolization with intense embolization pack within the expected distal splenic artery. Previously noted splenomegaly has resolved for dyspnea now measuring 13 cm in greatest dimension. Small chronic subcapsular hematoma is unchanged measuring 1.0 x 5.2 cm (35/2). PANCREAS: The pancreas is atrophic, but is otherwise unremarkable. Evaluation of the distal pancreas is limited by extensive streak artifact from embolization coils within the expected distal splenic artery. ADRENAL GLANDS: Right adrenal gland is unremarkable. Left adrenal gland is obscured by streak artifact. KIDNEYS, URETERS AND BLADDER: The kidneys are markedly atrophic bilaterally and demonstrate progressive parenchymal atrophy since prior examination. Transplanted kidney is seen within the right iliac fossa which is normal in size and cortical thickness and demonstrates normal cortical enhancement. There is mild right hydronephrosis and hydroureter. No intrarenal or ureteral calculi are identified. The bladder is moderately distended and the bladder wall is trabeculated in keeping with changes of chronic bladder outlet obstruction. There is marked prostatic hypertrophy and, together, the findings suggest chronic bladder outlet obstruction secondary to central prostatic hypertrophy. This may account for right-sided hydronephrosis, however, an anastomotic stricture or vesicoureteral reflux is not excluded by this examination. If indicated, this could be further evaluated with lasix  scintigraphy or retrograde urography. No perinephric or periureteral stranding. GI AND BOWEL: Moderate colonic stool burden  without evidence of obstruction. Appendix normal. The stomach, small bowel, and large bowel are  otherwise unremarkable. REPRODUCTIVE: There is marked prostatic hypertrophy and, together, the findings suggest chronic bladder outlet obstruction secondary to central prostatic hypertrophy. PERITONEUM AND RETROPERITONEUM: No ascites or free air. LYMPH NODES: No lymphadenopathy. BONES AND SOFT TISSUES: Mild aortoiliac atherosclerotic calcification. No aortic aneurysm. There is marked mass effect upon the left common iliac vein by the left internal iliac artery (53/2) which may account for the patient's reported asymmetric left leg edema. Disc vessel remains patent, however. Osseous structures are age appropriate. No acute bone abnormality. No lytic or blastic bone lesion. No focal soft tissue abnormality. RAF score: Aortic atherosclerosis (icd10-i70.0) IMPRESSION: 1. No pulmonary embolism. Small focus of peripheral consolidation within the posterior segment of the right upper lobe, suspicious for acute lobar pneumonia; follow-up chest CT in 6-12 weeks recommended to document resolution given immunocompromised state and alternative considerations. Progressive bibasilar cylindrical bronchiectasis since prior examination, with asymmetric bronchial wall thickening, more severe in the left perihilar region. Confluent right hilar and subcarinal adenopathy, nonspecific; reassess on follow-up imaging to ensure resolution. Marked mass effect upon the left common iliac vein by the left internal iliac artery, possibly accounting for asymmetric left leg edema; left common iliac vein remains patent. Mild right hydronephrosis and hydroureter, likely related to chronic bladder outlet obstruction from marked prostatic hypertrophy; anastomotic stricture or vesicoureteral reflux is not excluded and may be further evaluated with Lasix  scintigraphy or retrograde urography if indicated. Marked bilateral renal parenchymal atrophy and  transplanted kidney in the right iliac fossa with normal size, cortical thickness, and enhancement. Mild hepatic steatosis. Pancreatic atrophy with limited evaluation of the distal pancreas due to streak artifact from prior splenic artery embolization. Interval splenic embolization with residual embolic material in the expected distal splenic artery and residual small chronic subcapsular splenic hematoma measuring approximately 1.0 x 5.2 cm; prior splenomegaly has resolved. Mild aortoiliac atherosclerotic calcification. Raf score: Aortic atherosclerosis (icd10-i70.0). Electronically signed by: Dorethia Molt MD 08/05/2024 09:17 PM EST RP Workstation: [id number]   CT ABDOMEN PELVIS W CONTRAST Result Date: 08/05/2024 EXAM: CTA CHEST PE WITH AND WITHOUT CONTRAST CT ABDOMEN AND PELVIS WITH AND WITHOUT CONTRAST 08/05/2024 08:30:00 PM TECHNIQUE: CTA of the chest was performed after the administration of 100 mL of iohexol  (OMNIPAQUE ) 350 MG/ML injection. Multiplanar reformatted images are provided for review. MIP images are provided for review. CT of the abdomen and pelvis was performed with and without the administration of intravenous contrast. Automated exposure control, iterative reconstruction, and/or weight based adjustment of the mA/kV was utilized to reduce the radiation dose to as low as reasonably achievable. COMPARISON: Findings are compared to prior examination of 04/17/2020. CLINICAL HISTORY: Pulmonary embolism (PE) suspected, low to intermediate prob, neg D-dimer. Sepsis, left leg swelling, cough, fever, malaise. FINDINGS: CHEST: PULMONARY ARTERIES: Pulmonary arteries are adequately opacified for evaluation. No intraluminal filling defect to suggest pulmonary embolism. Main pulmonary artery is normal in caliber. MEDIASTINUM: No mediastinal lymphadenopathy. Mild to multivessel coronary artery calcification. Global cardiac size within normal limits. No pericardial effusion. Mild atherosclerotic calcification  within the thoracic aorta. No aortic aneurysm. LUNGS AND PLEURA: Bibasilar cylindrical bronchiectasis is present, progressive since prior examination. No superimposed focal pulmonary infiltrate. The esophagus is patulous which may reflect changes of esophageal dysmotility or gastroesophageal reflux. There is a small focus of peripheral consolidation within the posterior segment of the right upper lobe in keeping with acute lobar pneumonia in the appropriate clinical setting. Follow up evaluation in 6 - 12 weeks, however, is recommended to document resolution as alternative considerations in this  immunocompromised patient including atypical infection, post-transplant lymphoproliferative disorder, or bronchogenic carcinoma could appear similarly. Bibasilar atelectasis. Asymmetric bronchial wall thickening noted in keeping with airway inflammation, more severe within the left perihilar region. There is confluent right hilar and subcarinal adenopathy, nonspecific, possible reactant in nature. This can be reassessed on follow up imaging to ensure resolution. No pleural effusion or pneumothorax. SOFT TISSUES AND BONES: Osseous structures are age appropriate. No acute bone abnormality. No lytic or blastic bone lesion. No acute soft tissue abnormality. ABDOMEN AND PELVIS: LIVER: Mild hepatic steatosis. GALLBLADDER AND BILE DUCTS: Status post cholecystectomy. No biliary ductal dilatation. SPLEEN: As noted above, there has been interval splenic embolization with intense embolization pack within the expected distal splenic artery. Previously noted splenomegaly has resolved for dyspnea now measuring 13 cm in greatest dimension. Small chronic subcapsular hematoma is unchanged measuring 1.0 x 5.2 cm (35/2). PANCREAS: The pancreas is atrophic, but is otherwise unremarkable. Evaluation of the distal pancreas is limited by extensive streak artifact from embolization coils within the expected distal splenic artery. ADRENAL GLANDS:  Right adrenal gland is unremarkable. Left adrenal gland is obscured by streak artifact. KIDNEYS, URETERS AND BLADDER: The kidneys are markedly atrophic bilaterally and demonstrate progressive parenchymal atrophy since prior examination. Transplanted kidney is seen within the right iliac fossa which is normal in size and cortical thickness and demonstrates normal cortical enhancement. There is mild right hydronephrosis and hydroureter. No intrarenal or ureteral calculi are identified. The bladder is moderately distended and the bladder wall is trabeculated in keeping with changes of chronic bladder outlet obstruction. There is marked prostatic hypertrophy and, together, the findings suggest chronic bladder outlet obstruction secondary to central prostatic hypertrophy. This may account for right-sided hydronephrosis, however, an anastomotic stricture or vesicoureteral reflux is not excluded by this examination. If indicated, this could be further evaluated with lasix  scintigraphy or retrograde urography. No perinephric or periureteral stranding. GI AND BOWEL: Moderate colonic stool burden without evidence of obstruction. Appendix normal. The stomach, small bowel, and large bowel are otherwise unremarkable. REPRODUCTIVE: There is marked prostatic hypertrophy and, together, the findings suggest chronic bladder outlet obstruction secondary to central prostatic hypertrophy. PERITONEUM AND RETROPERITONEUM: No ascites or free air. LYMPH NODES: No lymphadenopathy. BONES AND SOFT TISSUES: Mild aortoiliac atherosclerotic calcification. No aortic aneurysm. There is marked mass effect upon the left common iliac vein by the left internal iliac artery (53/2) which may account for the patient's reported asymmetric left leg edema. Disc vessel remains patent, however. Osseous structures are age appropriate. No acute bone abnormality. No lytic or blastic bone lesion. No focal soft tissue abnormality. RAF score: Aortic atherosclerosis  (icd10-i70.0) IMPRESSION: 1. No pulmonary embolism. Small focus of peripheral consolidation within the posterior segment of the right upper lobe, suspicious for acute lobar pneumonia; follow-up chest CT in 6-12 weeks recommended to document resolution given immunocompromised state and alternative considerations. Progressive bibasilar cylindrical bronchiectasis since prior examination, with asymmetric bronchial wall thickening, more severe in the left perihilar region. Confluent right hilar and subcarinal adenopathy, nonspecific; reassess on follow-up imaging to ensure resolution. Marked mass effect upon the left common iliac vein by the left internal iliac artery, possibly accounting for asymmetric left leg edema; left common iliac vein remains patent. Mild right hydronephrosis and hydroureter, likely related to chronic bladder outlet obstruction from marked prostatic hypertrophy; anastomotic stricture or vesicoureteral reflux is not excluded and may be further evaluated with Lasix  scintigraphy or retrograde urography if indicated. Marked bilateral renal parenchymal atrophy and transplanted kidney in the right  iliac fossa with normal size, cortical thickness, and enhancement. Mild hepatic steatosis. Pancreatic atrophy with limited evaluation of the distal pancreas due to streak artifact from prior splenic artery embolization. Interval splenic embolization with residual embolic material in the expected distal splenic artery and residual small chronic subcapsular splenic hematoma measuring approximately 1.0 x 5.2 cm; prior splenomegaly has resolved. Mild aortoiliac atherosclerotic calcification. Raf score: Aortic atherosclerosis (icd10-i70.0). Electronically signed by: Dorethia Molt MD 08/05/2024 09:17 PM EST RP Workstation: [id number]   DG Chest Port 1 View Result Date: 08/05/2024 EXAM: 1 VIEW(S) XRAY OF THE CHEST 08/05/2024 06:40:00 PM COMPARISON: None available. CLINICAL HISTORY: SOB / cough / fever FINDINGS:  LUNGS AND PLEURA: Low lung volumes. No focal pulmonary opacity. No pleural effusion. No pneumothorax. HEART AND MEDIASTINUM: Aortic atherosclerosis. No acute abnormality of the cardiac and mediastinal silhouettes. BONES AND SOFT TISSUES: Vascular coils in left upper quadrant. No acute osseous abnormality. IMPRESSION: 1. No acute cardiopulmonary abnormality. 2. Vascular coils in the left upper quadrant. Electronically signed by: Greig Pique MD 08/05/2024 06:50 PM EST RP Workstation: [id number]    Microbiology: Results for orders placed or performed during the hospital encounter of 08/05/24  Resp panel by RT-PCR (RSV, Flu A&B, Covid) Anterior Nasal Swab     Status: None   Collection Time: 08/05/24  6:03 PM   Specimen: Anterior Nasal Swab  Result Value Ref Range Status   SARS Coronavirus 2 by RT PCR NEGATIVE NEGATIVE Final    Comment: (NOTE) SARS-CoV-2 target nucleic acids are NOT DETECTED.  The SARS-CoV-2 RNA is generally detectable in upper respiratory specimens during the acute phase of infection. The lowest concentration of SARS-CoV-2 viral copies this assay can detect is 138 copies/mL. A negative result does not preclude SARS-Cov-2 infection and should not be used as the sole basis for treatment or other patient management decisions. A negative result may occur with  improper specimen collection/handling, submission of specimen other than nasopharyngeal swab, presence of viral mutation(s) within the areas targeted by this assay, and inadequate number of viral copies(<138 copies/mL). A negative result must be combined with clinical observations, patient history, and epidemiological information. The expected result is Negative.  Fact Sheet for Patients:  bloggercourse.com  Fact Sheet for Healthcare Providers:  seriousbroker.it  This test is no t yet approved or cleared by the [place]  FDA and  has been authorized for detection  and/or diagnosis of SARS-CoV-2 by FDA under an Emergency Use Authorization (EUA). This EUA will remain  in effect (meaning this test can be used) for the duration of the COVID-19 declaration under Section 564(b)(1) of the Act, 21 U.S.C.section 360bbb-3(b)(1), unless the authorization is terminated  or revoked sooner.       Influenza A by PCR NEGATIVE NEGATIVE Final   Influenza B by PCR NEGATIVE NEGATIVE Final    Comment: (NOTE) The Xpert Xpress SARS-CoV-2/FLU/RSV plus assay is intended as an aid in the diagnosis of influenza from Nasopharyngeal swab specimens and should not be used as a sole basis for treatment. Nasal washings and aspirates are unacceptable for Xpert Xpress SARS-CoV-2/FLU/RSV testing.  Fact Sheet for Patients: bloggercourse.com  Fact Sheet for Healthcare Providers: seriousbroker.it  This test is not yet approved or cleared by the [place]  FDA and has been authorized for detection and/or diagnosis of SARS-CoV-2 by FDA under an Emergency Use Authorization (EUA). This EUA will remain in effect (meaning this test can be used) for the duration of the COVID-19 declaration under Section 564(b)(1) of the Act, 21 U.S.C. section  360bbb-3(b)(1), unless the authorization is terminated or revoked.     Resp Syncytial Virus by PCR NEGATIVE NEGATIVE Final    Comment: (NOTE) Fact Sheet for Patients: bloggercourse.com  Fact Sheet for Healthcare Providers: seriousbroker.it  This test is not yet approved or cleared by the [place]  FDA and has been authorized for detection and/or diagnosis of SARS-CoV-2 by FDA under an Emergency Use Authorization (EUA). This EUA will remain in effect (meaning this test can be used) for the duration of the COVID-19 declaration under Section 564(b)(1) of the Act, 21 U.S.C. section 360bbb-3(b)(1), unless the authorization is terminated  or revoked.  Performed at [facility], [address], [place], [place] [address]   Culture, blood (routine x 2)     Status: None (Preliminary result)   Collection Time: 08/06/24  1:28 AM   Specimen: BLOOD  Result Value Ref Range Status   Specimen Description   Final    BLOOD RIGHT ANTECUBITAL Performed at [facility], [address], [place], [place] [address]    Special Requests   Final    BOTTLES DRAWN AEROBIC AND ANAEROBIC Blood Culture adequate volume Performed at [facility], [address], [place], [place] [address]    Culture   Final    NO GROWTH < 12 HOURS Performed at [facility], [address], [place], [place] [address]    Report Status PENDING  Incomplete  Culture, blood (routine x 2)     Status: None (Preliminary result)   Collection Time: 08/06/24  1:28 AM   Specimen: BLOOD RIGHT HAND  Result Value Ref Range Status   Specimen Description   Final    BLOOD RIGHT HAND Performed at [facility], [address], [place], [place] [address]    Special Requests   Final    BOTTLES DRAWN AEROBIC AND ANAEROBIC Blood Culture results may not be optimal due to an inadequate volume of blood received in culture bottles Performed at [facility], [address], [place], [place] [address]    Culture   Final    NO GROWTH < 12 HOURS Performed at [facility], [address] [place], [place] [address]    Report Status PENDING  Incomplete    Labs: CBC: Recent Labs  Lab 08/05/24 1850 08/05/24 1958 08/06/24 0128  WBC  --  2.3* 2.0*  NEUTROABS  --  1.1*  --   HGB 10.9* 7.3* 8.1*  HCT 32.0* 23.1* 25.7*  MCV  --  75.5* 74.7*  PLT  --  137* 126*   Basic Metabolic Panel: Recent Labs  Lab 08/05/24 1850 08/05/24 1958 08/06/24 0128  NA 135 136 130*  K 4.9 3.9 4.9  CL 101 108 100  CO2  --  21* 22  GLUCOSE 73 65* 194*  BUN 48* 35* 32*  CREATININE 1.30*  0.98 1.11  CALCIUM   --  7.5* 8.3*   Liver Function Tests: Recent Labs  Lab 08/05/24 1958  AST 10*  ALT <5  ALKPHOS 63  BILITOT 0.7  PROT 6.2*  ALBUMIN  2.8*   CBG: Recent Labs  Lab 08/05/24 1819 08/05/24 2242 08/06/24 0733 08/06/24 1219  GLUCAP 84 116* 167* 171*    Discharge time spent: 35 minutes.  Signed: Concepcion Riser, MD [facility] 08/06/2024 "

## 2024-08-06 NOTE — Progress Notes (Signed)
 VASCULAR LAB    Bilateral lower extremity venous duplex has been performed.  See CV proc for preliminary results.   Mechel Haggard, RVT 08/06/2024, 9:37 AM

## 2024-08-09 ENCOUNTER — Telehealth: Payer: Self-pay

## 2024-08-09 NOTE — Transitions of Care (Post Inpatient/ED Visit) (Signed)
 "  08/09/2024  Name: William [name] MRN: [id number] DOB: 1951/04/20  Today's TOC FU Call Status: Today's TOC FU Call Status:: Successful TOC FU Call Completed TOC FU Call Complete Date: 08/09/24  Patient's Name and Date of Birth confirmed. Name, DOB  Transition Care Management Follow-up Telephone Call Date of Discharge: 08/06/24 Discharge Facility: [facility]) Type of Discharge: Inpatient Admission Primary Inpatient Discharge Diagnosis:: pneumonia How have you been since you were released from the hospital?: Better Any questions or concerns?: No  Items Reviewed: Did you receive and understand the discharge instructions provided?: Yes Medications obtained,verified, and reconciled?: Yes (Medications Reviewed) Any new allergies since your discharge?: No Dietary orders reviewed?: Yes Do you have support at home?: Yes People in Home [RPT]: child(ren), adult  Medications Reviewed Today: Medications Reviewed Today     Reviewed by Emmitt Pan, LPN (Licensed Practical Nurse) on 08/09/24 at 1550  Med List Status: <None>   Medication Order Taking? Sig Documenting Provider Last Dose Status Informant  acetaminophen  (TYLENOL ) 650 MG CR tablet [id number] Yes Take 650 mg by mouth every 8 (eight) hours as needed for pain. [provider]  Active Child  amoxicillin -clavulanate (AUGMENTIN ) 875-125 MG tablet [id number] Yes Take 1 tablet by mouth 2 (two) times daily for 5 days. Darci Pore, MD  Active   aspirin  EC 81 MG tablet [id number] Yes Take 81 mg by mouth daily. Swallow whole. [provider]  Active Child  CALCIUM -MAGNESIUM PO [id number] Yes Take 1 tablet by mouth in the morning and at bedtime. [provider]  Active Child  ciprofloxacin -dexamethasone  (CIPRODEX ) OTIC suspension [id number]  Place 4 drops into the right ear 2 (two) times daily.  Patient not taking: Reported on 08/09/2024   Sagardia, Miguel Jose, MD  Active Child  GENGRAF  100 MG capsule  [id number] Yes Take 100 mg by mouth 2 (two) times daily. Take along with 25 mg BID [provider]  Active Child  GENGRAF  25 MG capsule [id number] Yes Take 25 mg by mouth 2 (two) times daily. Take along with 100 mg BID [provider]  Active Child  insulin  glargine (LANTUS  SOLOSTAR) 100 UNIT/ML Solostar Pen [id number] Yes Inject 100 Units into the skin daily. Inject 14 units into skin  Patient taking differently: Inject 12 Units into the skin at bedtime.   Purcell Emil Schanz, MD  Active Child           Med Note [place], [place] I   Thu Aug 05, 2024 10:22 PM) Hasn't started   Insulin  Pen Needle (ADVOCATE INSULIN  PEN NEEDLES) 31G X 5 MM MISC [id number] Yes Use four times daily to inject insulin  Von [place], MD  Active Child  insulin  regular (NOVOLIN R,HUMULIN R ) 100 units/mL injection [id number] Yes Inject 6-8 Units into the skin See admin instructions. Inject 8 units subcutaneously with breakfast, and 6 units with lunch and supper Sliding scale [provider]  Active Child           Med Note ALLEGRA, [place] I   Thu Aug 05, 2024 10:18 PM)    lactulose  (CHRONULAC ) 10 GM/15ML solution [id number]  Take 45 mLs (30 g total) by mouth 3 (three) times daily.  Patient not taking: Reported on 08/09/2024   Sagardia, Miguel Jose, MD  Active Child  magnesium oxide (MAG-OX) 400 MG tablet [id number] Yes Take 400 mg by mouth daily. [provider]  Active Child  mupirocin  ointment (BACTROBAN ) 2 % [id number]  Apply 1 application topically daily.  Patient not taking: Reported on  08/09/2024   Sagardia, Miguel Jose, MD  Active Child  mycophenolate  (CELLCEPT ) 250 MG capsule [id number] Yes Take 250-500 mg by mouth 2 (two) times daily. Take 2 capsules (500 mg) in the morning and Take 1 capsule (250 mg) at bedtime [provider]  Active Child  ondansetron  (ZOFRAN  ODT) 4 MG disintegrating tablet [id number]  Take 1 tablet (4 mg total) by mouth 2 (two) times daily as needed for  nausea or vomiting.  Patient not taking: Reported on 08/09/2024   [name], Hallie C, PA-C  Active Child  ONE [facility] LANCETS MISC [id number] Yes Use to test blood sugar three times daily Von Pacific, MD  Active Child  [facility] VERIO test strip [id number] Yes USE 1 STRIP TO CHECK GLUCOSE THREE TIMES DAILY Von Pacific, MD  Active Child  predniSONE  (DELTASONE ) 5 MG tablet [id number] Yes Take 5 mg by mouth daily with breakfast. [provider]  Active Child  tamsulosin  (FLOMAX ) 0.4 MG CAPS capsule [id number] Yes Take 1 capsule (0.4 mg total) by mouth daily after supper. Darci Pore, MD  Active   TRESIBA  FLEXTOUCH 100 UNIT/ML FlexTouch Pen [id number] Yes Inject 14 Units into the skin daily.  Patient taking differently: Inject 12 Units into the skin at bedtime.   Purcell Emil Schanz, MD  Active Child  Med List Note William [name] 10/06/20 1126): Dialysis Tuesday, Thursday, Saturday [facility] and Equipment/Supplies: Were Home Health Services Ordered?: NA Any new equipment or medical supplies ordered?: NA  Functional Questionnaire: Do you need assistance with bathing/showering or dressing?: No Do you need assistance with meal preparation?: No Do you need assistance with eating?: No Do you have difficulty maintaining continence: No Do you need assistance with getting out of bed/getting out of a chair/moving?: No Do you have difficulty managing or taking your medications?: No  Follow up appointments reviewed: PCP Follow-up appointment confirmed?: Yes Date of PCP follow-up appointment?: 08/12/24 Follow-up Provider: [facility] Follow-up appointment confirmed?: NA Do you need transportation to your follow-up appointment?: No Do you understand care options if your condition(s) worsen?: Yes-patient verbalized understanding    SIGNATURE Julian Lemmings, LPN [facility] Nurse Health Advisor Direct Dial  [phone]  "

## 2024-08-11 LAB — CULTURE, BLOOD (ROUTINE X 2)
Culture: NO GROWTH
Culture: NO GROWTH
Special Requests: ADEQUATE

## 2024-08-12 ENCOUNTER — Inpatient Hospital Stay: Admitting: Emergency Medicine

## 2024-08-17 ENCOUNTER — Telehealth: Payer: Self-pay

## 2024-08-17 ENCOUNTER — Encounter (HOSPITAL_COMMUNITY): Payer: Self-pay

## 2024-08-17 NOTE — Telephone Encounter (Signed)
 Copied from CRM [id number]. Topic: Clinical - Medication Question >> Aug 17, 2024  1:41 PM Lauren C wrote: Reason for CRM: Pts son calling to let us  know that pt has been vomiting and very nauseous and is wondering if something can be called in for this prior to his appt. Currently scheduled for 1/15, declined sooner appt with any other provider. He was d/c from ED on 1/2 for pneumonia/sepsis. Requesting call back as soon as possible at [phone]

## 2024-08-18 NOTE — Telephone Encounter (Signed)
 Has appointment to see me tomorrow.  Will discuss then.

## 2024-08-18 NOTE — Telephone Encounter (Signed)
 Please advise

## 2024-08-19 ENCOUNTER — Ambulatory Visit: Admitting: Emergency Medicine

## 2024-08-19 ENCOUNTER — Encounter: Payer: Self-pay | Admitting: Emergency Medicine

## 2024-08-19 VITALS — BP 100/60 | HR 81 | Temp 98.0°F | Ht 65.0 in | Wt 146.0 lb

## 2024-08-19 DIAGNOSIS — J189 Pneumonia, unspecified organism: Secondary | ICD-10-CM | POA: Diagnosis not present

## 2024-08-19 DIAGNOSIS — Z944 Liver transplant status: Secondary | ICD-10-CM

## 2024-08-19 DIAGNOSIS — Z09 Encounter for follow-up examination after completed treatment for conditions other than malignant neoplasm: Secondary | ICD-10-CM | POA: Diagnosis not present

## 2024-08-19 DIAGNOSIS — Z940 Kidney transplant status: Secondary | ICD-10-CM | POA: Diagnosis not present

## 2024-08-19 DIAGNOSIS — R1013 Epigastric pain: Secondary | ICD-10-CM

## 2024-08-19 DIAGNOSIS — R112 Nausea with vomiting, unspecified: Secondary | ICD-10-CM | POA: Diagnosis not present

## 2024-08-19 MED ORDER — PANTOPRAZOLE SODIUM 40 MG PO TBEC: 40.0000 mg | DELAYED_RELEASE_TABLET | Freq: Every day | ORAL | 3 refills | Status: AC

## 2024-08-19 MED ORDER — ONDANSETRON 4 MG PO TBDP: 4.0000 mg | ORAL_TABLET | Freq: Three times a day (TID) | ORAL | 0 refills | Status: AC | PRN

## 2024-08-19 NOTE — Progress Notes (Signed)
 William [name] 74 y.o.   Chief Complaint  Patient presents with   Follow-up    Pt states that he is still complaining about headaches and stomach pain and nauseous     HISTORY OF PRESENT ILLNESS: This is a 74 y.o. male here for hospital discharge follow-up Was complaining of epigastric pain and nausea and vomiting.  Last time he vomited was 3 days ago Still having some burning epigastric pain every now and then No other associated symptoms Overall feeling better.  Finished antibiotics a couple days ago Was admitted with community-acquired pneumonia  Discharge summary as follows:  Physician Discharge Summary    Patient: William [name] MRN: [id number] DOB: Jun 25, 1951  Admit date:     08/05/2024  Discharge date: 08/06/2024  Discharge Physician: Concepcion Riser    PCP: Purcell Emil Schanz, MD    Recommendations at discharge:    PCP follow up in 1 week. [facility] transplant center as scheduled.   Discharge Diagnoses: Principal Problem:   CAP (community acquired pneumonia) Active Problems:   Pancytopenia (HCC)   S/P kidney transplant   S/P liver transplant (HCC)   Sepsis (HCC)   Type 2 diabetes mellitus with hyperglycemia (HCC)   Lobar pneumonia   Resolved Problems:   Other pancytopenia [facility])   Hospital Course: William [name] is a 74 y.o. male with medical history significant of kidney and liver transplant (08/10/2021 at [facility]), diabetes on insulin .  He is no longer on dialysis.  His son is at the bedside and helps interpret (Arabic). He has had weakness and malaise over the past couple days.  He is having a cough and worsening shortness of breath as well.  He returned from [place] yesterday.  He has an associated runny nose, nausea, vomiting, and congestion.  The patient has also been dealing with recurrent swelling in his lower extremities over the past 4 months.  He has also been having intermittent pain throughout this time as well.  This started prior to his travel to [place].   No sinus pain, ear pain/drainage, fevers, wheezing, diarrhea, known sick contacts, sore throat, itchy/watery eyes, myalgias.  Reports compliance with immunosuppressive therapy.  He has overall been eating and drinking normally with the exception of today.   Patient had CT chest, abdomen, pelvis- no PE, small peripheral right upper lobe consolidation, progressive bibasal cylindrical bronchiectasis, hilar and subcarinal adenopathy. Marked mass effect on left common iliac vein by the left internal iliac artery, possibly accounting for asymmetric left leg edema. Right hydronephrosis and hydroureter.  Above findings I discussed with son, patient does not want to stay and wished to follow outpatient with his PCP and transplant center. Discharged on Augmentin  therapy for 5 days.   Assessment and Plan: CAP (community acquired pneumonia) Presented with sepsis, got IV fluids, blood cultures.  Tachycardia, tachypnea improved. WBC stable. Lactic acid normal. Patient got Rocephin  and azithromycin . He wishes to go home, oral augmentin  for 5 days prescribed. He understands to follow PCP upon discharge.   Right leg swelling- Since many weeks. DVT ruled out. Discussed about findings of CT abdomen pelvis. I advised to follow PCP, [facility] transplant follow up as outpatient.   Pancytopenia (HCC) Seems chronic, outpatient follow up suggested.   S/P kidney transplant/  S/P liver transplant (HCC) Continue CellCept  500 mg in the morning, 250 mg in the evening, prednisone  5 mg daily, Gengraf  125 mg twice daily. Advised him to follow transplant center. Patient and son understand and agree.   Type 2 diabetes  mellitus with hyperglycemia (HCC) Home dose lantis regimen resumed.       HPI   Prior to Admission medications  Medication Sig Start Date End Date Taking? Authorizing Provider  acetaminophen  (TYLENOL ) 650 MG CR tablet Take 650 mg by mouth every 8 (eight) hours as needed for pain.   Yes [provider]  aspirin  EC 81 MG tablet Take 81 mg by mouth daily. Swallow whole.   Yes [provider]  CALCIUM -MAGNESIUM PO Take 1 tablet by mouth in the morning and at bedtime.   Yes [provider]  GENGRAF  100 MG capsule Take 100 mg by mouth 2 (two) times daily. Take along with 25 mg BID 02/26/22  Yes [provider]  GENGRAF  25 MG capsule Take 25 mg by mouth 2 (two) times daily. Take along with 100 mg BID 02/26/22  Yes [provider]  insulin  glargine (LANTUS  SOLOSTAR) 100 UNIT/ML Solostar Pen Inject 100 Units into the skin daily. Inject 14 units into skin Patient taking differently: Inject 12 Units into the skin at bedtime. 07/27/24  Yes Karyss Frese, Emil Schanz, MD  Insulin  Pen Needle (ADVOCATE INSULIN  PEN NEEDLES) 31G X 5 MM MISC Use four times daily to inject insulin  04/27/18  Yes Von Pacific, MD  insulin  regular (NOVOLIN R,HUMULIN R ) 100 units/mL injection Inject 6-8 Units into the skin See admin instructions. Inject 8 units subcutaneously with breakfast, and 6 units with lunch and supper Sliding scale   Yes [provider]  magnesium oxide (MAG-OX) 400 MG tablet Take 400 mg by mouth daily. 02/26/22  Yes [provider]  mycophenolate  (CELLCEPT ) 250 MG capsule Take 250-500 mg by mouth 2 (two) times daily. Take 2 capsules (500 mg) in the morning and Take 1 capsule (250 mg) at bedtime 02/26/22  Yes [provider]  ONE TOUCH LANCETS MISC Use to test blood sugar three times daily 04/27/18  Yes Von Pacific, MD  [facility] VERIO test strip USE 1 STRIP TO CHECK GLUCOSE THREE TIMES DAILY 07/23/21  Yes Von Pacific, MD  predniSONE  (DELTASONE ) 5 MG tablet Take 5 mg by mouth daily with breakfast. 02/15/22  Yes [provider]  tamsulosin  (FLOMAX ) 0.4 MG CAPS capsule Take 1 capsule (0.4 mg total) by mouth daily after supper. 08/06/24  Yes Darci Pore, MD  TRESIBA  FLEXTOUCH 100 UNIT/ML FlexTouch Pen Inject 14 Units into the skin daily. Patient  taking differently: Inject 12 Units into the skin at bedtime. 06/01/24  Yes Shonnie Poudrier Jose, MD  ciprofloxacin -dexamethasone  (CIPRODEX ) OTIC suspension Place 4 drops into the right ear 2 (two) times daily. Patient not taking: Reported on 08/19/2024 12/02/23   Purcell Emil Schanz, MD  lactulose  (CHRONULAC ) 10 GM/15ML solution Take 45 mLs (30 g total) by mouth 3 (three) times daily. Patient not taking: Reported on 08/19/2024 06/14/21   Ibrahim Mcpheeters Jose, MD  mupirocin  ointment (BACTROBAN ) 2 % Apply 1 application topically daily. Patient not taking: Reported on 08/19/2024 05/30/21   Purcell Emil Schanz, MD  ondansetron  (ZOFRAN  ODT) 4 MG disintegrating tablet Take 1 tablet (4 mg total) by mouth 2 (two) times daily as needed for nausea or vomiting. Patient not taking: Reported on 08/19/2024 05/01/20   [name], Hallie C, PA-C    Allergies[1]  Patient Active Problem List   Diagnosis Date Noted   S/P kidney transplant 08/05/2024   S/P liver transplant (HCC) 08/05/2024   CAP (community acquired pneumonia) 08/05/2024   Sepsis (HCC) 08/05/2024   Type 2 diabetes mellitus with hyperglycemia (HCC) 08/05/2024  Lobar pneumonia 08/05/2024   History of kidney transplant 03/26/2023   Edentulous 03/26/2023   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   Elevated PSA 12/14/2020   Repeated falls 11/13/2020   Coagulation defect, unspecified 11/06/2020   Other microscopic hematuria 11/03/2020   Traumatic amputation of third toe of left foot 10/15/2020   Thrombotic microangiopathy, 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   Physical deconditioning 04/27/2020   History of liver transplant (HCC) 04/27/2020   Presbycusis of both ears 09/24/2019   Esophageal varices (HCC) 09/23/2018   Mild protein-calorie malnutrition 02/04/2018   Anemia in chronic kidney disease 01/22/2018   Iron  deficiency anemia, unspecified 01/22/2018   Secondary hyperparathyroidism of renal origin 01/22/2018   CKD (chronic kidney disease) stage 4, GFR 15-29  ml/min (HCC) 12/19/2017   Goodpasture's syndrome (HCC) with associated hemoptysis 11/20/2017   Edema of both feet 11/20/2017   Pancytopenia (HCC) 11/06/2017   Type II diabetes mellitus with renal manifestations (HCC) 08/25/2015    Past Medical History:  Diagnosis Date   Anemia    Blind one eye    CHF (congestive heart failure) (HCC)    Cirrhosis (HCC)    ESRD on hemodialysis (HCC)    T, Th, Sat. [place]   Goodpasture's disease [facility])    on outpatient plasmapheresis/notes 11/20/2017   Grade I diastolic dysfunction 10/14/2017   History of anemia due to chronic kidney disease    History of blood transfusion X 1   UGIB; low blood count   History of plasmapheresis    qod (11/20/2017)   Hypertension    Pancytopenia (HCC) 08/20/2017   Type II diabetes mellitus [facility])     Past Surgical History:  Procedure Laterality Date   A/V FISTULAGRAM Left 10/16/2020   Procedure: A/V FISTULAGRAM;  Surgeon: Sheree Penne Bruckner, MD;  Location[facility] INVASIVE CV LAB;  Service: Cardiovascular;  Laterality: Left;   AMPUTATION TOE Left 10/07/2020   Procedure: AMPUTATION THIRD TOE;  Surgeon: Silva Juliene SAUNDERS, DPM;  Location: [facility] OR;  Service: Podiatry;  Laterality: Left;   AV FISTULA PLACEMENT Left 12/26/2017   Procedure: LEFT BRACHIOCEPHALIC ARTERIOVENOUS (AV) FISTULA CREATION;  Surgeon: Laurence Redell CROME, MD;  Location: [facility] OR;  Service: Vascular;  Laterality: Left;   AV FISTULA PLACEMENT Left 11/02/2020   Procedure: INSERTION OF ARTERIOVENOUS (AV) GORE-TEX GRAFT ARM;  Surgeon: Sheree Penne Bruckner, MD;  Location: [facility] OR;  Service: Vascular;  Laterality: Left;   AV FISTULA PLACEMENT Right 06/01/2021   Procedure: RIGHT ARM BRACHIOBASILIC ARTERIOVENOUS (AV) FISTULA CREATION;  Surgeon: Sheree Penne Bruckner, MD;  Location: [facility] OR;  Service: Vascular;  Laterality: Right;   BASCILIC VEIN TRANSPOSITION Left 02/16/2018   Procedure: BRACHIOBASILIC VEIN TRANSPOSITION SECOND STAGE;  Surgeon: Laurence Redell CROME, MD;   Location: [facility] OR;  Service: Vascular;  Laterality: Left;   BASCILIC VEIN TRANSPOSITION Right 07/27/2021   Procedure: SECOND STAGE RIGHT ARM BASILIC VEIN TRANSPOSITION;  Surgeon: Magda Debby SAILOR, MD;  Location: [facility] OR;  Service: Vascular;  Laterality: Right;   CATARACT EXTRACTION W/ INTRAOCULAR LENS  IMPLANT, BILATERAL Bilateral    ESOPHAGEAL BANDING N/A 04/01/2018   Procedure: ESOPHAGEAL BANDING;  Surgeon: Elicia Claw, MD;  Location: [facility] ENDOSCOPY;  Service: Gastroenterology;  Laterality: N/A;   ESOPHAGEAL BANDING N/A 06/29/2018   Procedure: ESOPHAGEAL BANDING;  Surgeon: Elicia Claw, MD;  Location: [facility] ENDOSCOPY;  Service: Gastroenterology;  Laterality: N/A;   ESOPHAGOGASTRODUODENOSCOPY (EGD) WITH PROPOFOL  N/A 04/01/2018   Procedure: ESOPHAGOGASTRODUODENOSCOPY (EGD) WITH PROPOFOL ;  Surgeon: Elicia Claw, MD;  Location: [facility] ENDOSCOPY;  Service: Gastroenterology;  Laterality:  N/A;   ESOPHAGOGASTRODUODENOSCOPY (EGD) WITH PROPOFOL  N/A 06/29/2018   Procedure: ESOPHAGOGASTRODUODENOSCOPY (EGD) WITH PROPOFOL ;  Surgeon: Elicia Claw, MD;  Location: [facility] ENDOSCOPY;  Service: Gastroenterology;  Laterality: N/A;   ESOPHAGOGASTRODUODENOSCOPY (EGD) WITH PROPOFOL  N/A 09/30/2018   Procedure: ESOPHAGOGASTRODUODENOSCOPY (EGD) WITH PROPOFOL ;  Surgeon: Elicia Claw, MD;  Location: [facility] ENDOSCOPY;  Service: Gastroenterology;  Laterality: N/A;   ESOPHAGOGASTRODUODENOSCOPY (EGD) WITH PROPOFOL  N/A 04/20/2019   Procedure: ESOPHAGOGASTRODUODENOSCOPY (EGD) WITH PROPOFOL ;  Surgeon: Elicia Claw, MD;  Location: [facility] ENDOSCOPY;  Service: Gastroenterology;  Laterality: N/A;   ESOPHAGOGASTRODUODENOSCOPY (EGD) WITH PROPOFOL  N/A 04/17/2020   Procedure: ESOPHAGOGASTRODUODENOSCOPY (EGD) WITH PROPOFOL ;  Surgeon: Lennard Lesta FALCON, MD;  Location: [facility] ENDOSCOPY;  Service: Gastroenterology;  Laterality: N/A;   I & D EXTREMITY Left 02/18/2018   Procedure: IRRIGATION AND DEBRIDEMENT LEFT ARM;  Surgeon: Serene Gaile ORN, MD;   Location: [facility] OR;  Service: Vascular;  Laterality: Left;   INSERTION OF DIALYSIS CATHETER N/A 01/19/2018   Procedure: INSERTION OF DIALYSIS CATHETER - RIGHT INTERNAL JUGULAR PLACEMENT;  Surgeon: Oris Krystal FALCON, MD;  Location: [facility] OR;  Service: Vascular;  Laterality: N/A;   INSERTION OF DIALYSIS CATHETER Right 11/02/2020   Procedure: INSERTION OF TUNNELED DIALYSIS CATHETER;  Surgeon: Sheree Penne Bruckner, MD;  Location: [facility] OR;  Service: Vascular;  Laterality: Right;   IR FLUORO GUIDE CV LINE RIGHT  11/10/2017   IR PARACENTESIS  12/10/2017   IR PARACENTESIS  12/26/2017   IR REMOVAL TUN CV CATH W/O FL  11/28/2017   IR THROMBECTOMY AV FISTULA W/THROMBOLYSIS/PTA INC/SHUNT/IMG LEFT Left 06/02/2019   IR US  GUIDE VASC ACCESS LEFT  06/02/2019   IR US  GUIDE VASC ACCESS RIGHT  11/10/2017   NECK SURGERY  2007   back of neck; no hardware in there   REVISON OF ARTERIOVENOUS FISTULA Left 11/02/2020   Procedure: REVISON OF ARTERIOVENOUS FISTULA;  Surgeon: Sheree Penne Bruckner, MD;  Location: [facility] OR;  Service: Vascular;  Laterality: Left;    Social History   Socioeconomic History   Marital status: Married    Spouse name: Not on file   Number of children: Not on file   Years of education: Not on file   Highest education level: Not on file  Occupational History   Not on file  Tobacco Use   Smoking status: Never   Smokeless tobacco: Never  Vaping Use   Vaping status: Never Used  Substance and Sexual Activity   Alcohol  use: Never   Drug use: Never   Sexual activity: Not Currently  Other Topics Concern   Not on file  Social History Narrative   Not on file   Social Drivers of Health   Tobacco Use: Low Risk (08/19/2024)   Patient History    Smoking Tobacco Use: Never    Smokeless Tobacco Use: Never    Passive Exposure: Not on file  Financial Resource Strain: Not on file  Food Insecurity: No Food Insecurity (08/05/2024)   Epic    Worried About Radiation Protection Practitioner of Food in the Last Year: Never true     Ran Out of Food in the Last Year: Never true  Transportation Needs: No Transportation Needs (08/05/2024)   Epic    Lack of Transportation (Medical): No    Lack of Transportation (Non-Medical): No  Physical Activity: Inactive (09/18/2021)   Received from [facility]   Exercise Vital Sign    On average, how many days per week do you engage in moderate to strenuous exercise (like a brisk walk)?: 0  days    On average, how many minutes do you engage in exercise at this level?: 0 min  Stress: Not on file  Social Connections: Unknown (08/05/2024)   Social Connection and Isolation Panel    Frequency of Communication with Friends and Family: Patient declined    Frequency of Social Gatherings with Friends and Family: Patient declined    Attends Religious Services: Patient declined    Active Member of Clubs or Organizations: Patient declined    Attends Banker Meetings: Patient declined    Marital Status: Married  Catering Manager Violence: Not At Risk (08/05/2024)   Epic    Fear of Current or Ex-Partner: No    Emotionally Abused: No    Physically Abused: No    Sexually Abused: No  Depression (PHQ2-9): Low Risk (06/02/2023)   Depression (PHQ2-9)    PHQ-2 Score: 0  Alcohol  Screen: Not on file  Housing: Low Risk (08/05/2024)   Epic    Unable to Pay for Housing in the Last Year: No    Number of Times Moved in the Last Year: 0    Homeless in the Last Year: No  Utilities: Not At Risk (08/05/2024)   Epic    Threatened with loss of utilities: No  Health Literacy: Not on file    Family History  Problem Relation Age of Onset   Diabetes Mellitus II Sister    Diabetes Sister    Stroke Brother    Heart attack Brother      Review of Systems  Constitutional: Negative.  Negative for chills and fever.  HENT: Negative.  Negative for congestion and sore throat.   Respiratory: Negative.  Negative for cough and shortness of breath.   Cardiovascular: Negative.  Negative for  chest pain and palpitations.  Gastrointestinal:  Positive for abdominal pain, nausea and vomiting. Negative for blood in stool, constipation and diarrhea.  Genitourinary: Negative.  Negative for dysuria and urgency.  Skin: Negative.  Negative for rash.  Neurological: Negative.  Negative for dizziness and headaches.  All other systems reviewed and are negative.   Vitals:   08/19/24 1404  BP: 100/60  Pulse: 81  Temp: 98 F (36.7 C)  SpO2: 94%    Physical Exam Vitals reviewed.  Constitutional:      Appearance: Normal appearance.  HENT:     Head: Normocephalic.     Mouth/Throat:     Mouth: Mucous membranes are moist.     Pharynx: Oropharynx is clear.  Eyes:     Extraocular Movements: Extraocular movements intact.     Pupils: Pupils are equal, round, and reactive to light.  Cardiovascular:     Rate and Rhythm: Normal rate and regular rhythm.     Pulses: Normal pulses.     Heart sounds: Normal heart sounds.  Pulmonary:     Effort: Pulmonary effort is normal.     Breath sounds: Normal breath sounds.  Abdominal:     Palpations: Abdomen is soft.     Tenderness: There is no abdominal tenderness.  Musculoskeletal:     Cervical back: No tenderness.  Lymphadenopathy:     Cervical: No cervical adenopathy.  Skin:    General: Skin is warm and dry.     Capillary Refill: Capillary refill takes less than 2 seconds.  Neurological:     General: No focal deficit present.     Mental Status: He is alert and oriented to person, place, and time.  Psychiatric:  Mood and Affect: Mood normal.        Behavior: Behavior normal.      ASSESSMENT & PLAN: A total of 43 minutes was spent with the patient and counseling/coordination of care regarding preparing for this visit, review of most recent office visit notes, review of most recent hospital discharge notes, review of multiple chronic medical conditions and their management, review of all medications, review of most recent bloodwork  results, review of health maintenance items, education on nutrition, prognosis, documentation, and need for follow up.   Problem List Items Addressed This Visit       Respiratory   CAP (community acquired pneumonia) - Primary   Much improved.  Finished antibiotics. Clinically stable.  No red flag signs or symptoms No longer vomiting.  Still has some epigastric pain Recommend daily pantoprazole  for 2 weeks Zofran  ODT as needed Advised to stay well-hydrated Advised to contact the office if no better or worse during the next several days        Digestive   Nausea and vomiting   Clinically stable. No longer vomiting.  Last nausea and vomiting episode 3 days ago Seems to be well-hydrated Symptom management discussed Advised to use Zofran  ODT as needed      Relevant Medications   ondansetron  (ZOFRAN -ODT) 4 MG disintegrating tablet     Other   S/P liver transplant (HCC)   Continue CellCept  500 mg in the morning, 250 mg in the evening, prednisone  5 mg daily, Gengraf  125 mg twice daily. Advised him to follow transplant center. Patient and son understand and agree      Epigastric pain   Diet and nutrition discussed Recommend pantoprazole  40 mg daily for 2 weeks Clinically stable.  Benign abdominal examination      Relevant Medications   pantoprazole  (PROTONIX ) 40 MG tablet   RESOLVED: S/P kidney transplant   Continue CellCept  500 mg in the morning, 250 mg in the evening, prednisone  5 mg daily, Gengraf  125 mg twice daily. Advised him to follow transplant center. Patient and son understand and agree      Other Visit Diagnoses       Hospital discharge follow-up          Patient Instructions  ??????? ?????? ??? ???????? Nausea and Vomiting, Adult ??????? ?? ?????? ??????? ?????? ?? ??? ??? ??? ??????. ?? ?????? ???? ??????? ?????? ?? ???? ??? ??????. ????? ????? ????? ????? ??????? ?????? ??? ??????? ?? ??? ???? ???? ????? ???????. ????? ?? ????? ????? ?? ????? ??????  ??????? ???????. ?? ??? ?????? ?? ?? ???? ????? ?????? ??????? ????? ????? ???? ??? ???? ?????? ?? ?????? ???????. ????? ???? ????????? ?????? ???? ?? ???????? ???? ?? ???? ???? ????? (???? ???????) ???? ???? ???? ??????? ???????. ??? ?? ??????? ???? ??????? ?????? ???? ????? ???????? ???? ??????? ??????. ???? ??? ????????? ?? ??????: ?????? ?????? ??????? ???? ???? ????. ???? ??? ????? ???? ??????? ?????? ???. ????? ??????    ????? ??? ?????? ?????? ?????? (ORS). ???? ????? ????? ?? ????????? ?????? ???????.  ????? ??????? ??????? ???? ??????? ????? ??? ???????. ????? ????????? ??????? ?????? ?????? ????? ?????????? ???????? ?????? ??????? ?????? ????? ????? (????? ??????? ???????).  ????? ??????? ???? ????? ?????? ????? ???? ?? ??????. ??? ??? ??????? ?????? ???????? ??????? ??????? ??????? ?? ?????? ?????? ??????? ?????????.  ???? ????? ??????? ???????? ??? ?????? ?? ???????? ?? ????????? ??? ??????? ?????? ?????????? ???????? ???????.  ????? ?? ????? ????????? ????????.  ???? ??????? ?????? ????????? ?? ???????. ??????? ????  ????? ??????? ??? ??????? ???? ??????? ??????? ????? ????? ????? ???? ?? ??? ???? ????.  ???? ????? ????? ?? ??????? ???? ??? ???? ???? ??????.  ???? ???? ?????? ?????? ???????? ????  20 ????? ??? ?????. ???? ?? ????? ????? ????????? ????? ??????? ???? ??????.  ???? ????? ??? ?? ???? ?? ??? ?? ?????? ???? ????????.  ???? ??? ?????? ?? ?????? ???? ???????.  ????? ????? ??????? ?? ?????.  ???? ???? ????? ??? ?????? ????????.  ????? ????? ?????? ????????. ???? ??? ???. ???? ????? ??????? ?????? ?? ??????? ???????:  ??? ?????? ??????? ??????? ???? ???????.  ???? ????? ????? ????.  ??????? ????.  ??? ?????? ??? ????? ??? ?? ?????.  ??? ???? ?????? ???????? ??? ???? ?????.  ?????? ????? ?? ????.  ???????? ?? ????.  ?????? ????? ?? ???????.  ???? ??? ????.  ??? ?? ????? ??????. ???? ???????? ????? ?? ??????? ???????:  ?????? ???? ?? ????? ?? ?????? ?? ?????? ??  ????.  ?????? ?????? ?????? ?? ??????? ?????? ?????.  ??????? ???? ??????.  ???? ????? ???? ?? ??? ???? ???? ?? ???? ???? ??? ??????.  ????? ???? (????) ????? ?????? ?? ???? ????? ?? ???? ????????.  ??????? ????? ??? ?? ???? ?? ?????? ?? ??????.  ???????? ?? ??? ???? ?? ??????? ?? ?????? ?? ?????.  ???????? ?? ????? ?? ?????? ?? ?????? ????? ?????.  ?????? ???? ??? ???? ???????? ??????.  ????? ????? ???????.  ???? ??????? ??????.  ??????? ??????? ??????? ???: ? ??? ???? ?? ???? ????? ?? ????? ?? ??? ???? ???. ? ???? ???????. ? ???? ????. ? ??????? ?????????. ? ??????. ? ????? ??????. ??? ???? ??? ??????? ????? ??? ???? ???? ?????. ???? ???????? ??? ?????. ???? ???? 911.   ?? ????? ?? ??? ???? ??????? ????? ?? ??.  ?? ??? ??????? ????? ??? ????????. ????  ??????? ?? ?????? ??????? ?????? ?? ??? ??? ??? ??????. ?? ?????? ???? ??????? ?????? ?? ???? ??? ??????. ????? ?? ????? ????? ?? ????? ?????? ??????? ???????.  ????? ???????? ???? ??????? ?????? ????? ????? ?????? ?? ??? ??????? ?? ??????.  ????? ??????? ??? ??????? ???? ??????? ??????? ????? ????? ????? ???? ?? ??? ???? ????.  ????? ??????? ???????? ??????? ?????? ??? ?????? ??????? ???? ????? ???? ?? ???? ???? ????? ?????.  ????? ????? ?????? ????????. ???? ??? ???. ??? ????? ?? ??? ????????? ?? ???? ?????? ????????? ???? ?????? ???? ??????? ??????. ???? ?? ?????? ??? ????? ???? ?? ???? ?? ???? ??????? ??????.? Document Revised: 02/21/2021 Document Reviewed: 02/21/2021 Elsevier Patient Education  2024 Elsevier Inc.    Emil Schaumann, MD [facility]    [1]  Allergies Allergen Reactions   Heparin  Other (See Comments)    Patient is Muslim and is not permitted Pork derative due to religious belief)   Porcine (Pork) Protein-Containing Drug Products Other (See Comments)    Patient does not eat pork due to religious beliefs

## 2024-08-19 NOTE — Assessment & Plan Note (Signed)
 Continue CellCept  500 mg in the morning, 250 mg in the evening, prednisone  5 mg daily, Gengraf  125 mg twice daily. Advised him to follow transplant center. Patient and son understand and agree

## 2024-08-19 NOTE — Patient Instructions (Signed)
 ??????? ?????? ??? ???????? Nausea and Vomiting, Adult ??????? ?? ?????? ??????? ?????? ?? ??? ??? ??? ??????. ?? ?????? ???? ??????? ?????? ?? ???? ??? ??????. ????? ????? ????? ????? ??????? ?????? ??? ??????? ?? ??? ???? ???? ????? ???????. ????? ?? ????? ????? ?? ????? ?????? ??????? ???????. ?? ??? ?????? ?? ?? ???? ????? ?????? ??????? ????? ????? ???? ??? ???? ?????? ?? ?????? ???????. ????? ???? ????????? ?????? ???? ?? ???????? ???? ?? ???? ???? ????? (???? ???????) ???? ???? ???? ??????? ???????. ??? ?? ??????? ???? ??????? ?????? ???? ????? ???????? ???? ??????? ??????. ???? ??? ????????? ?? ??????: ?????? ?????? ??????? ???? ???? ????. ???? ??? ????? ???? ??????? ?????? ???. ????? ??????    ????? ??? ?????? ?????? ?????? (ORS). ???? ????? ????? ?? ????????? ?????? ???????.  ????? ??????? ??????? ???? ??????? ????? ??? ???????. ????? ????????? ??????? ?????? ?????? ????? ?????????? ???????? ?????? ??????? ?????? ????? ????? (????? ??????? ???????).  ????? ??????? ???? ????? ?????? ????? ???? ?? ??????. ??? ??? ??????? ?????? ???????? ??????? ??????? ??????? ?? ?????? ?????? ??????? ?????????.  ???? ????? ??????? ???????? ??? ?????? ?? ???????? ?? ????????? ??? ??????? ?????? ?????????? ???????? ???????.  ????? ?? ????? ????????? ????????.  ???? ??????? ?????? ????????? ?? ???????. ??????? ????  ????? ??????? ??? ??????? ???? ??????? ??????? ????? ????? ????? ???? ?? ??? ???? ????.  ???? ????? ????? ?? ??????? ???? ??? ???? ???? ??????.  ???? ???? ?????? ?????? ???????? ???? 20 ????? ??? ?????. ???? ?? ????? ????? ????????? ????? ??????? ???? ??????.  ???? ????? ??? ?? ???? ?? ??? ?? ?????? ???? ????????.  ???? ??? ?????? ?? ?????? ???? ???????.  ????? ????? ??????? ?? ?????.  ???? ???? ????? ??? ?????? ????????.  ????? ????? ?????? ????????. ???? ??? ???. ???? ????? ??????? ?????? ?? ??????? ???????:  ??? ?????? ??????? ??????? ???? ???????.  ???? ????? ????? ????.  ???????  ????.  ??? ?????? ??? ????? ??? ?? ?????.  ??? ???? ?????? ???????? ??? ???? ?????.  ?????? ????? ?? ????.  ???????? ?? ????.  ?????? ????? ?? ???????.  ???? ??? ????.  ??? ?? ????? ??????. ???? ???????? ????? ?? ??????? ???????:  ?????? ???? ?? ????? ?? ?????? ?? ?????? ?? ????.  ?????? ?????? ?????? ?? ??????? ?????? ?????.  ??????? ???? ??????.  ???? ????? ???? ?? ??? ???? ???? ?? ???? ???? ??? ??????.  ????? ???? (????) ????? ?????? ?? ???? ????? ?? ???? ????????.  ??????? ????? ??? ?? ???? ?? ?????? ?? ??????.  ???????? ?? ??? ???? ?? ??????? ?? ?????? ?? ?????.  ???????? ?? ????? ?? ?????? ?? ?????? ????? ?????.  ?????? ???? ??? ???? ???????? ??????.  ????? ????? ???????.  ???? ??????? ??????.  ??????? ??????? ??????? ???: ? ??? ???? ?? ???? ????? ?? ????? ?? ??? ???? ???. ? ???? ???????. ? ???? ????. ? ??????? ?????????. ? ??????. ? ????? ??????. ??? ???? ??? ??????? ????? ??? ???? ???? ?????. ???? ???????? ??? ?????. ???? ???? 911.   ?? ????? ?? ??? ???? ??????? ????? ?? ??.  ?? ??? ??????? ????? ??? ????????. ????  ??????? ?? ?????? ??????? ?????? ?? ??? ??? ??? ??????. ?? ?????? ???? ??????? ?????? ?? ???? ??? ??????. ????? ?? ????? ????? ?? ????? ?????? ??????? ???????.  ????? ???????? ???? ??????? ?????? ????? ????? ?????? ?? ??? ??????? ?? ??????.  ????? ??????? ??? ??????? ???? ??????? ??????? ????? ????? ????? ???? ?? ??? ???? ????.  ????? ??????? ???????? ??????? ?????? ??? ?????? ??????? ???? ????? ???? ?? ???? ???? ????? ?????.  ????? ????? ?????? ????????. ???? ??? ???. ??? ????? ?? ??? ????????? ?? ???? ?????? ????????? ???? ?????? ???? ??????? ??????. ???? ?? ?????? ??? ????? ???? ?? ???? ?? ???? ??????? ??????SABRA?  Document Revised: 02/21/2021 Document Reviewed: 02/21/2021 [employer] Patient Education  2024 [employer].

## 2024-08-19 NOTE — Assessment & Plan Note (Signed)
 Much improved.  Finished antibiotics. Clinically stable.  No red flag signs or symptoms No longer vomiting.  Still has some epigastric pain Recommend daily pantoprazole  for 2 weeks Zofran  ODT as needed Advised to stay well-hydrated Advised to contact the office if no better or worse during the next several days

## 2024-08-19 NOTE — Assessment & Plan Note (Addendum)
 Diet and nutrition discussed Recommend pantoprazole  40 mg daily for 2 weeks Clinically stable.  Benign abdominal examination

## 2024-08-19 NOTE — Assessment & Plan Note (Signed)
 Clinically stable. No longer vomiting.  Last nausea and vomiting episode 3 days ago Seems to be well-hydrated Symptom management discussed Advised to use Zofran  ODT as needed
